# Patient Record
Sex: Male | Born: 1950 | Race: White | Hispanic: No | Marital: Married | Smoking: Former smoker
Health system: Southern US, Community
[De-identification: ages and names within clinical notes are randomized; demographics above are authoritative.]

## PROBLEM LIST (undated history)

## (undated) DIAGNOSIS — L509 Urticaria, unspecified: Secondary | ICD-10-CM

## (undated) DIAGNOSIS — I255 Ischemic cardiomyopathy: Secondary | ICD-10-CM

## (undated) DIAGNOSIS — K56609 Unspecified intestinal obstruction, unspecified as to partial versus complete obstruction: Secondary | ICD-10-CM

## (undated) DIAGNOSIS — I252 Old myocardial infarction: Secondary | ICD-10-CM

## (undated) DIAGNOSIS — L989 Disorder of the skin and subcutaneous tissue, unspecified: Secondary | ICD-10-CM

## (undated) DIAGNOSIS — N529 Male erectile dysfunction, unspecified: Secondary | ICD-10-CM

## (undated) DIAGNOSIS — G43009 Migraine without aura, not intractable, without status migrainosus: Secondary | ICD-10-CM

## (undated) DIAGNOSIS — R9439 Abnormal result of other cardiovascular function study: Secondary | ICD-10-CM

## (undated) DIAGNOSIS — G4733 Obstructive sleep apnea (adult) (pediatric): Secondary | ICD-10-CM

## (undated) DIAGNOSIS — K5790 Diverticulosis of intestine, part unspecified, without perforation or abscess without bleeding: Secondary | ICD-10-CM

## (undated) DIAGNOSIS — R51 Headache: Secondary | ICD-10-CM

## (undated) DIAGNOSIS — I2510 Atherosclerotic heart disease of native coronary artery without angina pectoris: Secondary | ICD-10-CM

## (undated) DIAGNOSIS — G629 Polyneuropathy, unspecified: Secondary | ICD-10-CM

## (undated) DIAGNOSIS — E785 Hyperlipidemia, unspecified: Secondary | ICD-10-CM

## (undated) DIAGNOSIS — I10 Essential (primary) hypertension: Secondary | ICD-10-CM

## (undated) DIAGNOSIS — Z87438 Personal history of other diseases of male genital organs: Secondary | ICD-10-CM

## (undated) DIAGNOSIS — R001 Bradycardia, unspecified: Secondary | ICD-10-CM

## (undated) DIAGNOSIS — I2699 Other pulmonary embolism without acute cor pulmonale: Secondary | ICD-10-CM

## (undated) DIAGNOSIS — N400 Enlarged prostate without lower urinary tract symptoms: Secondary | ICD-10-CM

## (undated) DIAGNOSIS — K2270 Barrett's esophagus without dysplasia: Secondary | ICD-10-CM

## (undated) DIAGNOSIS — M5481 Occipital neuralgia: Secondary | ICD-10-CM

## (undated) DIAGNOSIS — I5022 Chronic systolic (congestive) heart failure: Secondary | ICD-10-CM

## (undated) DIAGNOSIS — I501 Left ventricular failure: Secondary | ICD-10-CM

## (undated) DIAGNOSIS — R079 Chest pain, unspecified: Secondary | ICD-10-CM

## (undated) DIAGNOSIS — N200 Calculus of kidney: Secondary | ICD-10-CM

## (undated) DIAGNOSIS — Z7902 Long term (current) use of antithrombotics/antiplatelets: Secondary | ICD-10-CM

## (undated) DIAGNOSIS — L501 Idiopathic urticaria: Secondary | ICD-10-CM

## (undated) DIAGNOSIS — I249 Acute ischemic heart disease, unspecified: Secondary | ICD-10-CM

## (undated) DIAGNOSIS — G2581 Restless legs syndrome: Secondary | ICD-10-CM

## (undated) DIAGNOSIS — M7138 Other bursal cyst, other site: Secondary | ICD-10-CM

## (undated) DIAGNOSIS — E871 Hypo-osmolality and hyponatremia: Secondary | ICD-10-CM

## (undated) DIAGNOSIS — K259 Gastric ulcer, unspecified as acute or chronic, without hemorrhage or perforation: Secondary | ICD-10-CM

## (undated) DIAGNOSIS — K2970 Gastritis, unspecified, without bleeding: Secondary | ICD-10-CM

## (undated) DIAGNOSIS — I5020 Unspecified systolic (congestive) heart failure: Secondary | ICD-10-CM

## (undated) DIAGNOSIS — F32A Depression, unspecified: Secondary | ICD-10-CM

## (undated) DIAGNOSIS — R600 Localized edema: Secondary | ICD-10-CM

## (undated) DIAGNOSIS — R42 Dizziness and giddiness: Secondary | ICD-10-CM

## (undated) DIAGNOSIS — I951 Orthostatic hypotension: Secondary | ICD-10-CM

## (undated) DIAGNOSIS — F419 Anxiety disorder, unspecified: Secondary | ICD-10-CM

## (undated) DIAGNOSIS — E119 Type 2 diabetes mellitus without complications: Secondary | ICD-10-CM

## (undated) DIAGNOSIS — F329 Major depressive disorder, single episode, unspecified: Secondary | ICD-10-CM

## (undated) DIAGNOSIS — I509 Heart failure, unspecified: Secondary | ICD-10-CM

## (undated) DIAGNOSIS — R222 Localized swelling, mass and lump, trunk: Secondary | ICD-10-CM

## (undated) DIAGNOSIS — N486 Induration penis plastica: Secondary | ICD-10-CM

## (undated) DIAGNOSIS — I739 Peripheral vascular disease, unspecified: Secondary | ICD-10-CM

## (undated) DIAGNOSIS — K769 Liver disease, unspecified: Secondary | ICD-10-CM

## (undated) DIAGNOSIS — I5030 Unspecified diastolic (congestive) heart failure: Secondary | ICD-10-CM

## (undated) DIAGNOSIS — G5600 Carpal tunnel syndrome, unspecified upper limb: Secondary | ICD-10-CM

## (undated) DIAGNOSIS — L603 Nail dystrophy: Secondary | ICD-10-CM

## (undated) DIAGNOSIS — K649 Unspecified hemorrhoids: Secondary | ICD-10-CM

## (undated) DIAGNOSIS — N139 Obstructive and reflux uropathy, unspecified: Secondary | ICD-10-CM

## (undated) DIAGNOSIS — L301 Dyshidrosis [pompholyx]: Secondary | ICD-10-CM

## (undated) DIAGNOSIS — I209 Angina pectoris, unspecified: Secondary | ICD-10-CM

## (undated) DIAGNOSIS — R1310 Dysphagia, unspecified: Secondary | ICD-10-CM

## (undated) DIAGNOSIS — M1990 Unspecified osteoarthritis, unspecified site: Secondary | ICD-10-CM

## (undated) DIAGNOSIS — I700 Atherosclerosis of aorta: Secondary | ICD-10-CM

## (undated) DIAGNOSIS — Z87442 Personal history of urinary calculi: Secondary | ICD-10-CM

## (undated) DIAGNOSIS — G8929 Other chronic pain: Secondary | ICD-10-CM

## (undated) DIAGNOSIS — I639 Cerebral infarction, unspecified: Secondary | ICD-10-CM

## (undated) DIAGNOSIS — Z981 Arthrodesis status: Secondary | ICD-10-CM

## (undated) DIAGNOSIS — K5792 Diverticulitis of intestine, part unspecified, without perforation or abscess without bleeding: Secondary | ICD-10-CM

## (undated) DIAGNOSIS — R519 Headache, unspecified: Secondary | ICD-10-CM

## (undated) DIAGNOSIS — K219 Gastro-esophageal reflux disease without esophagitis: Secondary | ICD-10-CM

## (undated) DIAGNOSIS — M797 Fibromyalgia: Secondary | ICD-10-CM

## (undated) DIAGNOSIS — I219 Acute myocardial infarction, unspecified: Secondary | ICD-10-CM

## (undated) DIAGNOSIS — R55 Syncope and collapse: Secondary | ICD-10-CM

## (undated) DIAGNOSIS — J449 Chronic obstructive pulmonary disease, unspecified: Secondary | ICD-10-CM

## (undated) HISTORY — PX: COLONOSCOPY: SHX174

## (undated) HISTORY — DX: Localized swelling, mass and lump, trunk: R22.2

## (undated) HISTORY — DX: Dizziness and giddiness: R42

## (undated) HISTORY — DX: Other pulmonary embolism without acute cor pulmonale: I26.99

## (undated) HISTORY — DX: Calculus of kidney: N20.0

## (undated) HISTORY — PX: BACK SURGERY: SHX140

## (undated) HISTORY — DX: Chest pain, unspecified: R07.9

## (undated) HISTORY — DX: Atherosclerotic heart disease of native coronary artery without angina pectoris: I25.10

## (undated) HISTORY — PX: HERNIA REPAIR: SHX51

## (undated) HISTORY — PX: CORONARY ARTERY BYPASS GRAFT: SHX141

## (undated) HISTORY — DX: Male erectile dysfunction, unspecified: N52.9

## (undated) HISTORY — DX: Acute myocardial infarction, unspecified: I21.9

## (undated) HISTORY — DX: Barrett's esophagus without dysplasia: K22.70

## (undated) HISTORY — PX: CERVICAL FUSION: SHX112

## (undated) HISTORY — DX: Obstructive sleep apnea (adult) (pediatric): G47.33

## (undated) HISTORY — DX: Fibromyalgia: M79.7

## (undated) HISTORY — DX: Other chronic pain: G89.29

## (undated) HISTORY — DX: Orthostatic hypotension: I95.1

## (undated) HISTORY — PX: CATARACT EXTRACTION: SUR2

## (undated) HISTORY — DX: Ischemic cardiomyopathy: I25.5

## (undated) HISTORY — DX: Unspecified diastolic (congestive) heart failure: I50.30

## (undated) HISTORY — PX: CHOLECYSTECTOMY: SHX55

## (undated) HISTORY — DX: Chronic systolic (congestive) heart failure: I50.22

## (undated) HISTORY — DX: Disorder of the skin and subcutaneous tissue, unspecified: L98.9

## (undated) HISTORY — DX: Hyperlipidemia, unspecified: E78.5

## (undated) HISTORY — DX: Diverticulitis of intestine, part unspecified, without perforation or abscess without bleeding: K57.92

## (undated) HISTORY — PX: EYE SURGERY: SHX253

## (undated) HISTORY — DX: Cerebral infarction, unspecified: I63.9

## (undated) HISTORY — DX: Unspecified intestinal obstruction, unspecified as to partial versus complete obstruction: K56.609

## (undated) HISTORY — DX: Urticaria, unspecified: L50.9

## (undated) HISTORY — DX: Syncope and collapse: R55

## (undated) HISTORY — DX: Essential (primary) hypertension: I10

## (undated) HISTORY — DX: Major depressive disorder, single episode, unspecified: F32.9

## (undated) HISTORY — DX: Depression, unspecified: F32.A

---

## 1991-04-23 DIAGNOSIS — I219 Acute myocardial infarction, unspecified: Secondary | ICD-10-CM

## 1991-04-23 DIAGNOSIS — I2510 Atherosclerotic heart disease of native coronary artery without angina pectoris: Secondary | ICD-10-CM

## 1991-04-23 DIAGNOSIS — I639 Cerebral infarction, unspecified: Secondary | ICD-10-CM

## 1991-04-23 HISTORY — DX: Atherosclerotic heart disease of native coronary artery without angina pectoris: I25.10

## 1991-04-23 HISTORY — DX: Cerebral infarction, unspecified: I63.9

## 1991-04-23 HISTORY — DX: Acute myocardial infarction, unspecified: I21.9

## 1992-01-24 DIAGNOSIS — Z951 Presence of aortocoronary bypass graft: Secondary | ICD-10-CM

## 1992-01-24 HISTORY — DX: Presence of aortocoronary bypass graft: Z95.1

## 2004-04-22 HISTORY — PX: CORONARY ANGIOPLASTY: SHX604

## 2004-11-28 ENCOUNTER — Inpatient Hospital Stay: Payer: Self-pay | Admitting: Internal Medicine

## 2004-11-29 ENCOUNTER — Inpatient Hospital Stay (HOSPITAL_COMMUNITY): Admission: AD | Admit: 2004-11-29 | Discharge: 2004-12-01 | Payer: Self-pay | Admitting: Cardiology

## 2005-01-01 ENCOUNTER — Encounter: Payer: Self-pay | Admitting: Cardiovascular Disease

## 2005-01-11 ENCOUNTER — Ambulatory Visit: Payer: Self-pay | Admitting: Internal Medicine

## 2005-01-20 ENCOUNTER — Encounter: Payer: Self-pay | Admitting: Cardiovascular Disease

## 2005-02-20 ENCOUNTER — Encounter: Payer: Self-pay | Admitting: Cardiovascular Disease

## 2005-03-22 ENCOUNTER — Encounter: Payer: Self-pay | Admitting: Cardiovascular Disease

## 2005-03-28 ENCOUNTER — Ambulatory Visit: Payer: Self-pay | Admitting: Oncology

## 2005-04-04 ENCOUNTER — Ambulatory Visit: Payer: Self-pay | Admitting: Oncology

## 2005-04-10 ENCOUNTER — Ambulatory Visit (HOSPITAL_COMMUNITY): Admission: RE | Admit: 2005-04-10 | Discharge: 2005-04-10 | Payer: Self-pay | Admitting: Oncology

## 2005-05-07 ENCOUNTER — Ambulatory Visit: Payer: Self-pay | Admitting: Gastroenterology

## 2005-06-27 ENCOUNTER — Ambulatory Visit: Payer: Self-pay | Admitting: Unknown Physician Specialty

## 2005-07-11 ENCOUNTER — Ambulatory Visit: Payer: Self-pay | Admitting: Unknown Physician Specialty

## 2005-07-12 ENCOUNTER — Ambulatory Visit: Payer: Self-pay | Admitting: Oncology

## 2005-09-02 ENCOUNTER — Ambulatory Visit: Payer: Self-pay | Admitting: Pain Medicine

## 2005-09-17 ENCOUNTER — Ambulatory Visit: Payer: Self-pay | Admitting: Pain Medicine

## 2005-10-01 ENCOUNTER — Ambulatory Visit: Payer: Self-pay | Admitting: Physician Assistant

## 2005-10-10 ENCOUNTER — Inpatient Hospital Stay (HOSPITAL_COMMUNITY): Admission: AD | Admit: 2005-10-10 | Discharge: 2005-10-11 | Payer: Self-pay | Admitting: Cardiovascular Disease

## 2006-01-20 DIAGNOSIS — I2699 Other pulmonary embolism without acute cor pulmonale: Secondary | ICD-10-CM | POA: Insufficient documentation

## 2006-01-20 DIAGNOSIS — Q211 Atrial septal defect, unspecified: Secondary | ICD-10-CM

## 2006-01-20 DIAGNOSIS — I4891 Unspecified atrial fibrillation: Secondary | ICD-10-CM

## 2006-01-20 HISTORY — DX: Atrial septal defect, unspecified: Q21.10

## 2006-01-20 HISTORY — DX: Unspecified atrial fibrillation: I48.91

## 2006-01-20 HISTORY — DX: Other pulmonary embolism without acute cor pulmonale: I26.99

## 2006-02-18 ENCOUNTER — Other Ambulatory Visit: Payer: Self-pay

## 2006-02-18 ENCOUNTER — Inpatient Hospital Stay: Payer: Self-pay | Admitting: Gastroenterology

## 2006-03-19 ENCOUNTER — Encounter: Payer: Self-pay | Admitting: Cardiovascular Disease

## 2006-03-22 ENCOUNTER — Encounter: Payer: Self-pay | Admitting: Cardiovascular Disease

## 2006-04-04 ENCOUNTER — Ambulatory Visit: Payer: Self-pay | Admitting: Gastroenterology

## 2006-04-22 ENCOUNTER — Encounter: Payer: Self-pay | Admitting: Cardiovascular Disease

## 2006-05-24 ENCOUNTER — Other Ambulatory Visit: Payer: Self-pay

## 2006-05-24 ENCOUNTER — Inpatient Hospital Stay: Payer: Self-pay | Admitting: Cardiovascular Disease

## 2006-05-26 ENCOUNTER — Encounter: Payer: Self-pay | Admitting: Cardiovascular Disease

## 2006-06-21 ENCOUNTER — Encounter: Payer: Self-pay | Admitting: Cardiovascular Disease

## 2006-07-14 ENCOUNTER — Encounter: Payer: Self-pay | Admitting: Cardiovascular Disease

## 2006-07-22 ENCOUNTER — Encounter: Payer: Self-pay | Admitting: Cardiovascular Disease

## 2006-08-21 ENCOUNTER — Encounter: Payer: Self-pay | Admitting: Cardiovascular Disease

## 2006-09-21 ENCOUNTER — Encounter: Payer: Self-pay | Admitting: Cardiovascular Disease

## 2006-10-21 ENCOUNTER — Encounter: Payer: Self-pay | Admitting: Cardiovascular Disease

## 2006-11-21 ENCOUNTER — Encounter: Payer: Self-pay | Admitting: Cardiovascular Disease

## 2006-11-21 HISTORY — PX: CARDIAC CATHETERIZATION: SHX172

## 2006-12-08 ENCOUNTER — Ambulatory Visit: Payer: Self-pay | Admitting: Cardiovascular Disease

## 2007-05-20 ENCOUNTER — Ambulatory Visit: Payer: Self-pay | Admitting: Rheumatology

## 2007-07-20 ENCOUNTER — Ambulatory Visit: Payer: Self-pay | Admitting: Urology

## 2007-11-09 ENCOUNTER — Ambulatory Visit: Payer: Self-pay | Admitting: Gastroenterology

## 2007-12-16 ENCOUNTER — Ambulatory Visit: Payer: Self-pay | Admitting: Unknown Physician Specialty

## 2007-12-30 ENCOUNTER — Ambulatory Visit: Payer: Self-pay | Admitting: Unknown Physician Specialty

## 2007-12-30 ENCOUNTER — Other Ambulatory Visit: Payer: Self-pay

## 2007-12-31 ENCOUNTER — Emergency Department: Payer: Self-pay | Admitting: Emergency Medicine

## 2007-12-31 ENCOUNTER — Ambulatory Visit: Payer: Self-pay | Admitting: Unknown Physician Specialty

## 2008-02-29 ENCOUNTER — Ambulatory Visit: Payer: Self-pay | Admitting: Internal Medicine

## 2008-04-22 HISTORY — PX: SHOULDER SURGERY: SHX246

## 2008-06-01 ENCOUNTER — Ambulatory Visit: Payer: Self-pay | Admitting: Internal Medicine

## 2008-11-01 ENCOUNTER — Ambulatory Visit: Payer: Self-pay | Admitting: Internal Medicine

## 2008-11-23 ENCOUNTER — Ambulatory Visit: Payer: Self-pay | Admitting: Gastroenterology

## 2009-01-20 HISTORY — PX: OTHER SURGICAL HISTORY: SHX169

## 2009-01-20 HISTORY — PX: COLON SURGERY: SHX602

## 2009-01-21 ENCOUNTER — Inpatient Hospital Stay: Payer: Self-pay | Admitting: Internal Medicine

## 2009-02-20 DIAGNOSIS — K56609 Unspecified intestinal obstruction, unspecified as to partial versus complete obstruction: Secondary | ICD-10-CM

## 2009-02-20 HISTORY — DX: Unspecified intestinal obstruction, unspecified as to partial versus complete obstruction: K56.609

## 2009-06-17 ENCOUNTER — Ambulatory Visit: Payer: Self-pay | Admitting: Neurosurgery

## 2009-06-20 HISTORY — PX: NECK SURGERY: SHX720

## 2009-07-07 ENCOUNTER — Inpatient Hospital Stay (HOSPITAL_COMMUNITY): Admission: RE | Admit: 2009-07-07 | Discharge: 2009-07-08 | Payer: Self-pay | Admitting: Neurosurgery

## 2009-07-23 ENCOUNTER — Emergency Department: Payer: Self-pay | Admitting: Internal Medicine

## 2009-07-24 ENCOUNTER — Emergency Department: Payer: Self-pay | Admitting: Emergency Medicine

## 2009-11-30 ENCOUNTER — Ambulatory Visit: Payer: Self-pay | Admitting: Gastroenterology

## 2009-12-04 ENCOUNTER — Ambulatory Visit: Payer: Self-pay | Admitting: Gastroenterology

## 2009-12-11 ENCOUNTER — Ambulatory Visit: Payer: Self-pay | Admitting: Neurosurgery

## 2009-12-19 ENCOUNTER — Ambulatory Visit: Payer: Self-pay | Admitting: Gastroenterology

## 2009-12-22 LAB — PATHOLOGY REPORT

## 2010-01-15 ENCOUNTER — Ambulatory Visit: Payer: Self-pay | Admitting: Cardiovascular Disease

## 2010-01-22 ENCOUNTER — Telehealth: Payer: Self-pay | Admitting: Cardiovascular Disease

## 2010-01-30 ENCOUNTER — Ambulatory Visit: Payer: Self-pay | Admitting: Surgery

## 2010-01-31 LAB — PATHOLOGY REPORT

## 2010-02-27 ENCOUNTER — Ambulatory Visit (HOSPITAL_COMMUNITY)
Admission: RE | Admit: 2010-02-27 | Discharge: 2010-02-28 | Payer: Self-pay | Source: Home / Self Care | Admitting: Neurosurgery

## 2010-04-20 ENCOUNTER — Ambulatory Visit: Payer: Self-pay | Admitting: Neurosurgery

## 2010-04-20 ENCOUNTER — Emergency Department: Payer: Self-pay | Admitting: Emergency Medicine

## 2010-05-15 ENCOUNTER — Ambulatory Visit: Payer: Self-pay | Admitting: Gastroenterology

## 2010-05-22 NOTE — Progress Notes (Signed)
  Phone Note Outgoing Call   Call placed by: Dessie Coma  LPN,  January 22, 2010 12:08 PM Call placed to: Patient Summary of Call: [facility] for patient to return call.  Follow-up for Phone Call        Patient called:  Informed patient per Dr. Kirke Corin, liver test is normal.  Cholesterol is very good but Triglycerides is high.  Per Dr. Kirke Corin, to start on fish oil 1000mg  2 caps/day.  Per patient's wife, patient has tried fish oil before and he begins to itch.  Will notify Dr. Kirke Corin. Follow-up by: Dessie Coma  LPN,  January 22, 2010 2:48 PM  Additional Follow-up for Phone Call Additional follow up Details #1::        Patient called:LMVM for patient to call this nurse back. Additional Follow-up by: Dessie Coma  LPN,  January 24, 2010 8:57 AM    Additional Follow-up for Phone Call Additional follow up Details #2::    T.C. from wife-notified her per Dr. Kirke Corin, OK to not take fish oil.  Triglycerides are not that high.  He should monitor his diet and increase his exercise. Follow-up by: Dessie Coma  LPN,  January 24, 2010 2:30 PM

## 2010-06-21 HISTORY — PX: CARDIAC CATHETERIZATION: SHX172

## 2010-07-03 LAB — CBC
Hemoglobin: 15.1 g/dL (ref 13.0–17.0)
MCH: 31.7 pg (ref 26.0–34.0)
MCV: 93.1 fL (ref 78.0–100.0)
RBC: 4.77 MIL/uL (ref 4.22–5.81)
WBC: 6.3 10*3/uL (ref 4.0–10.5)

## 2010-07-03 LAB — BASIC METABOLIC PANEL
CO2: 29 mEq/L (ref 19–32)
Chloride: 105 mEq/L (ref 96–112)
GFR calc Af Amer: >60 mL/min (ref >60)
Sodium: 141 mEq/L (ref 135–145)

## 2010-07-03 LAB — SURGICAL PCR SCREEN: Staphylococcus aureus: NEGATIVE

## 2010-07-12 ENCOUNTER — Encounter: Payer: Self-pay | Admitting: Cardiovascular Disease

## 2010-07-12 ENCOUNTER — Ambulatory Visit (INDEPENDENT_AMBULATORY_CARE_PROVIDER_SITE_OTHER): Payer: Medicare Other | Admitting: Cardiovascular Disease

## 2010-07-12 DIAGNOSIS — N529 Male erectile dysfunction, unspecified: Secondary | ICD-10-CM

## 2010-07-12 DIAGNOSIS — E785 Hyperlipidemia, unspecified: Secondary | ICD-10-CM

## 2010-07-12 DIAGNOSIS — I10 Essential (primary) hypertension: Secondary | ICD-10-CM

## 2010-07-12 DIAGNOSIS — R001 Bradycardia, unspecified: Secondary | ICD-10-CM

## 2010-07-12 DIAGNOSIS — I498 Other specified cardiac arrhythmias: Secondary | ICD-10-CM

## 2010-07-12 DIAGNOSIS — R079 Chest pain, unspecified: Secondary | ICD-10-CM

## 2010-07-12 DIAGNOSIS — I2510 Atherosclerotic heart disease of native coronary artery without angina pectoris: Secondary | ICD-10-CM

## 2010-07-12 NOTE — Assessment & Plan Note (Signed)
Willie Clark had a significant progression of his anginal symptoms over the last few weeks and currently he is in NYHA class 3. Due to this and previous extensive cardiac history, I recommend cardiac catheterization and possible PCI. Risks, benefits and alternatives were discussed. Will continue current medical therapy but will decrease Metoprolol to 25 mg daily due to bradycardia.

## 2010-07-12 NOTE — Assessment & Plan Note (Signed)
Continue treatment with Crestor. 

## 2010-07-12 NOTE — Progress Notes (Signed)
HPI  Willie Clark is here for an urgent follow up visit. He has ne been feeling well over last few weeks. He has been having chest tightness and dyspnea with minimal activities even when doing minor yard work. He is extremely fatigued. He has been using sl NTG frequently. No palpitations or syncope. Most recent cardiac cath was in 2008.    Allergies  Allergen Reactions  . Fish Oil      No current outpatient prescriptions on file prior to visit.     Past Medical History  Diagnosis Date  . Chronic chest pain   . Hypertension   . Hyperlipidemia   . Stroke   . Sleep apnea   . Depression   . Bowel obstruction 02/2009    small bowel  . Pulmonary embolism 01/2006    Post-op/treated  . Lightheadedness   . SOB (shortness of breath)   . Syncope and collapse   . Coronary artery disease 1993    MI   . Fibromyalgia      Past Surgical History  Procedure Date  . Bowel obstruction 01/2009  . Shoulder surgery 2010  . Neck surgery 06/2009  . Coronary artery bypass graft 1993/01/2006    redo at [facility]. LIMA to LAD, SVG to OM2 and SVG to RPDA  . Cardiac catheterization 11/2006    patent grafts. Significant OM3 disease and occluded diagonals.     Family History  Problem Relation Age of Onset  . Heart disease Father 53     History   Social History  . Marital Status: Married    Spouse Name: N/A    Number of Children: N/A  . Years of Education: N/A   Occupational History  . Not on file.   Social History Main Topics  . Smoking status: Former Smoker    Types: Cigarettes  . Smokeless tobacco: Not on file  . Alcohol Use: No  . Drug Use: No  . Sexually Active:    Other Topics Concern  . Not on file   Social History Narrative  . No narrative on file     ROS Constitutional: Negative for fever, chills, activity change and appetite change . Positive for diaphoresis and fatigue.  HENT: Negative for hearing loss, nosebleeds, congestion, sore throat, facial swelling, drooling,  trouble swallowing, neck pain, voice change, sinus pressure and tinnitus.  Eyes: Negative for photophobia, pain, discharge and visual disturbance.  Respiratory: Negative for apnea, cough,  and wheezing.  Cardiovascular: Negative for palpitations and leg swelling. Positive for chest tightness and shortness of breath. Gastrointestinal: Negative for nausea, vomiting, abdominal pain, diarrhea, constipation, blood in stool and abdominal distention.  Genitourinary: Negative for dysuria, urgency, frequency, hematuria and decreased urine volume.  Musculoskeletal: Negative for   joint swelling and gait problem. Positive for myalgias, back pain and arthralgias. Skin: Negative for color change, pallor, rash and wound.  Neurological: Negative for dizziness, tremors, seizures, syncope, speech difficulty, weakness, light-headedness, numbness and headaches.  Psychiatric/Behavioral: Negative for suicidal ideas, hallucinations, behavioral problems and agitation. The patient is not nervous/anxious.     PHYSICAL EXAM   BP 124/73  Pulse 57  Wt 196 lb 6.4 oz (89.086 kg)  SpO2 97%  Constitutional: He is oriented to person, place, and time. He appears well-developed and well-nourished. No distress.  HENT:  Head: Normocephalic and atraumatic.  Eyes: Pupils are equal, round, and reactive to light. Right eye exhibits no discharge. Left eye exhibits no discharge.  Neck: Normal range of motion. Neck supple. No  JVD present. No thyromegaly present.  Cardiovascular: Normal rate, regular rhythm, normal heart sounds and intact distal pulses. Exam reveals no gallop and no friction rub.  No murmur heard.  Pulmonary/Chest: Effort normal and breath sounds normal. No stridor. No respiratory distress. He has no wheezes. He has no rales. He exhibits no tenderness.  Abdominal: Soft. Bowel sounds are normal. He exhibits no distension. There is no tenderness. There is no rebound and no guarding.  Musculoskeletal: Normal range of  motion. He exhibits no edema and no tenderness.  Neurological: He is alert and oriented to person, place, and time. Coordination normal.  Skin: Skin is warm and dry. No rash noted. He is not diaphoretic. No erythema. No pallor.  Psychiatric: He has a normal mood and affect. His behavior is normal. Judgment and thought content normal.      EKG: Sinus bradycardia. NSSTTW changes.    ASSESSMENT AND PLAN

## 2010-07-12 NOTE — Assessment & Plan Note (Signed)
This could be contributing to his fatigue. Will decrease Metoprolol ER to 25 mg daily.

## 2010-07-12 NOTE — Assessment & Plan Note (Signed)
BP is well controlled 

## 2010-07-12 NOTE — Assessment & Plan Note (Signed)
Will consider treatment with a PDE inhibitor if his cardiac status is optimized and can get him off NTG.

## 2010-07-15 LAB — BASIC METABOLIC PANEL
CO2: 29 mEq/L (ref 19–32)
Calcium: 9.6 mg/dL (ref 8.4–10.5)
GFR calc Af Amer: >60 mL/min (ref >60)
GFR calc non Af Amer: >60 mL/min (ref >60)
Glucose, Bld: 103 mg/dL — ABNORMAL HIGH (ref 70–99)
Potassium: 4.5 mEq/L (ref 3.5–5.1)
Sodium: 140 mEq/L (ref 135–145)

## 2010-07-15 LAB — CBC
HCT: 42.9 % (ref 39.0–52.0)
Hemoglobin: 14.7 g/dL (ref 13.0–17.0)
RBC: 4.47 MIL/uL (ref 4.22–5.81)
RDW: 13.5 % (ref 11.5–15.5)

## 2010-07-15 LAB — SURGICAL PCR SCREEN: MRSA, PCR: NEGATIVE

## 2010-07-16 ENCOUNTER — Ambulatory Visit: Payer: Self-pay | Admitting: Cardiovascular Disease

## 2010-07-18 ENCOUNTER — Ambulatory Visit (HOSPITAL_COMMUNITY)
Admission: RE | Admit: 2010-07-18 | Discharge: 2010-07-18 | Disposition: A | Discharge status: Home / Self Care | Payer: Medicare Other | Source: Ambulatory Visit | Attending: Cardiovascular Disease | Admitting: Cardiovascular Disease

## 2010-07-18 DIAGNOSIS — R0989 Other specified symptoms and signs involving the circulatory and respiratory systems: Secondary | ICD-10-CM | POA: Insufficient documentation

## 2010-07-18 DIAGNOSIS — I2510 Atherosclerotic heart disease of native coronary artery without angina pectoris: Secondary | ICD-10-CM

## 2010-07-18 DIAGNOSIS — R0609 Other forms of dyspnea: Secondary | ICD-10-CM | POA: Insufficient documentation

## 2010-07-18 DIAGNOSIS — R079 Chest pain, unspecified: Secondary | ICD-10-CM | POA: Insufficient documentation

## 2010-07-18 DIAGNOSIS — Z9861 Coronary angioplasty status: Secondary | ICD-10-CM | POA: Insufficient documentation

## 2010-07-18 LAB — BASIC METABOLIC PANEL
BUN: 9 mg/dL (ref 6–23)
Chloride: 101 mEq/L (ref 96–112)
Glucose, Bld: 105 mg/dL — ABNORMAL HIGH (ref 70–99)
Potassium: 3.7 mEq/L (ref 3.5–5.1)

## 2010-07-18 LAB — CBC
HCT: 41.9 % (ref 39.0–52.0)
Hemoglobin: 13.9 g/dL (ref 13.0–17.0)
MCHC: 33.2 g/dL (ref 30.0–36.0)
RBC: 4.63 MIL/uL (ref 4.22–5.81)

## 2010-07-18 LAB — PROTIME-INR
INR: 1.02 (ref 0.00–1.49)
Prothrombin Time: 13.6 seconds (ref 11.6–15.2)

## 2010-07-18 LAB — GLUCOSE, CAPILLARY: Glucose-Capillary: 108 mg/dL — ABNORMAL HIGH (ref 70–99)

## 2010-07-23 ENCOUNTER — Encounter: Payer: Self-pay | Admitting: Cardiovascular Disease

## 2010-07-23 ENCOUNTER — Ambulatory Visit (INDEPENDENT_AMBULATORY_CARE_PROVIDER_SITE_OTHER): Payer: Medicare Other | Admitting: Cardiovascular Disease

## 2010-07-23 DIAGNOSIS — I10 Essential (primary) hypertension: Secondary | ICD-10-CM

## 2010-07-23 DIAGNOSIS — N529 Male erectile dysfunction, unspecified: Secondary | ICD-10-CM

## 2010-07-23 DIAGNOSIS — E785 Hyperlipidemia, unspecified: Secondary | ICD-10-CM

## 2010-07-23 DIAGNOSIS — I2510 Atherosclerotic heart disease of native coronary artery without angina pectoris: Secondary | ICD-10-CM

## 2010-07-23 NOTE — Assessment & Plan Note (Signed)
Continue treatment with Crestor. 

## 2010-07-23 NOTE — Assessment & Plan Note (Signed)
BP is well controlled. I decreased Metoprolol to 25 mg daily during last visit due to bradycardia.

## 2010-07-23 NOTE — Progress Notes (Signed)
HPI  Willie Clark is here for a follow up visit after a recent cardiac cath. It showed patent grafts with normal LVSF. There was significant in stent restenosis in proximal LCX. However, graft to OM2 was patent and gave retrograde flow to OM3. Thus, I elected not to perform PCI which would probably have a marginal benefit. He is doing reasonably well. Mild chest discomfort and dyspnea. He is still concerned about erectile dysfunction.   Allergies  Allergen Reactions  . Fish Oil      Current Outpatient Prescriptions on File Prior to Visit  Medication Sig Dispense Refill  . acetaminophen (TYLENOL) 500 MG tablet Take 500 mg by mouth every 6 (six) hours as needed.        [name] amLODipine (NORVASC) 2.5 MG tablet Take 2.5 mg by mouth daily.        [name] aspirin 325 MG tablet Take 325 mg by mouth daily.        . enalapril (VASOTEC) 5 MG tablet Take 5 mg by mouth daily.        . fluticasone (VERAMYST) 27.5 MCG/SPRAY nasal spray 2 sprays by Nasal route daily.        . furosemide (LASIX) 40 MG tablet Take 40 mg by mouth daily as needed.        . gabapentin (NEURONTIN) 600 MG tablet Take 800 mg by mouth 3 (three) times daily.       [name] HYDROcodone-acetaminophen (NORCO) 10-325 MG per tablet Take 1 tablet by mouth every 6 (six) hours as needed.        . isosorbide dinitrate (ISORDIL) 30 MG tablet Take 30 mg by mouth 2 (two) times daily.        . Loratadine (ALLERGY RELIEF REDI-TABS PO) Take 1 tablet by mouth daily.        . metoprolol (TOPROL-XL) 50 MG 24 hr tablet Take 25 mg by mouth daily.       . nitroGLYCERIN (NITROSTAT) 0.4 MG SL tablet Place 0.4 mg under the tongue every 5 (five) minutes as needed.        . pantoprazole (PROTONIX) 40 MG tablet Take 40 mg by mouth daily.        . rosuvastatin (CRESTOR) 20 MG tablet Take 20 mg by mouth daily.        [name] DISCONTD: DULoxetine (CYMBALTA) 30 MG capsule Take 30 mg by mouth 2 (two) times daily.           Past Medical History  Diagnosis Date  . Chronic chest pain     . Stroke   . Sleep apnea   . Depression   . Bowel obstruction 02/2009    small bowel  . Pulmonary embolism 01/2006    Post-op/treated  . Lightheadedness   . SOB (shortness of breath)   . Syncope and collapse   . Coronary artery disease 1993    MI   . Fibromyalgia   . Hypertension   . Hyperlipidemia      Past Surgical History  Procedure Date  . Bowel obstruction 01/2009  . Shoulder surgery 2010  . Neck surgery 06/2009  . Coronary artery bypass graft 1993/01/2006    redo at [facility]. LIMA to LAD, SVG to OM2 and SVG to RPDA  . Cardiac catheterization 11/2006    patent grafts. Significant OM3 disease and occluded diagonals.  . Cardiac catheterization 06/2010    patent grafts. significant ISR in proximal LCX but OM2 is bypassed and gives retrograde flow to OM3.  Family History  Problem Relation Age of Onset  . Heart disease Father 20     History   Social History  . Marital Status: Married    Spouse Name: N/A    Number of Children: N/A  . Years of Education: N/A   Occupational History  . Not on file.   Social History Main Topics  . Smoking status: Former Smoker    Types: Cigarettes  . Smokeless tobacco: Not on file  . Alcohol Use: No  . Drug Use: No  . Sexually Active:    Other Topics Concern  . Not on file   Social History Narrative  . No narrative on file     ROS Negative other than out is mentioned in HPI.   PHYSICAL EXAM   BP 126/76  Pulse 51  Wt 198 lb 6.4 oz (89.994 kg)  SpO2 95%  Constitutional: He is oriented to person, place, and time. He appears well-developed and well-nourished. No distress.  HENT:  Head: Normocephalic and atraumatic.  Eyes: Pupils are equal, round, and reactive to light. Right eye exhibits no discharge. Left eye exhibits no discharge.  Neck: Normal range of motion. Neck supple. No JVD present. No thyromegaly present.  Cardiovascular: Normal rate, regular rhythm, normal heart sounds and intact distal pulses. Exam  reveals no gallop and no friction rub.  No murmur heard.  Pulmonary/Chest: Effort normal and breath sounds normal. No stridor. No respiratory distress. He has no wheezes. He has no rales. He exhibits no tenderness.  Abdominal: Soft. Bowel sounds are normal. He exhibits no distension. There is no tenderness. There is no rebound and no guarding.  Musculoskeletal: Normal range of motion. He exhibits no edema and no tenderness.  Neurological: He is alert and oriented to person, place, and time. Coordination normal.  Skin: Skin is warm and dry. No rash noted. He is not diaphoretic. No erythema. No pallor.  Psychiatric: He has a normal mood and affect. His behavior is normal. Judgment and thought content normal.        ASSESSMENT AND PLAN

## 2010-07-23 NOTE — Assessment & Plan Note (Signed)
Recent cardiac cath showed patent grafts and no need for revascularization. I will go ahead and stop Isordil. Continue other medical therapy. I advised him to start an exercise program. He is going to enroll in [place].

## 2010-07-23 NOTE — Assessment & Plan Note (Signed)
I think this is contributing significantly to his depression and overall functional status. Will treat with Cialis 10 mg prn (samples given to asses response). He will be not on Isordil any more. I explained to him not to take NTG with this.

## 2010-07-25 ENCOUNTER — Encounter: Payer: Self-pay | Admitting: Cardiovascular Disease

## 2010-07-25 NOTE — Procedures (Signed)
NAME:  ILARIO, DHALIWAL                 ACCOUNT NO.:  [id number]  MEDICAL RECORD NO.:  [id number]           PATIENT TYPE:  O  LOCATION:  [facility]                         FACILITY:  [facility]  PHYSICIAN:  Lorine Bears, MD     DATE OF BIRTH:  Sep 23, 1950  DATE OF PROCEDURE:  07/18/2010 DATE OF DISCHARGE:  07/18/2010                           CARDIAC CATHETERIZATION   PROCEDURES PERFORMED: 1. Left heart catheterization 2. Coronary angiography. 3. LIMA angiography. 4. SVG angiography. 5. Left ventricular angiography.  ACCESS:  Right femoral artery.  INDICATIONS AND CLINICAL HISTORY:  [name] is a 60 year old gentleman who is well known to me.  He has extensive history of coronary artery disease status post coronary artery bypass graft surgery many years ago. He had multiple angioplasties since then, mostly to the left circumflex. He ultimately underwent redo coronary artery bypass surgery in August 2008 at [facility].  He presented for an outpatient evaluation with progressive symptoms of chest tightness and dyspnea which indicated worsening of his stable angina over the last 2 weeks.  He was not able to do his activities of daily living without significant discomfort. This was a significant change from his previous symptoms.  He is known to have chronic chest pain with element of fibromyalgia but also he does have chronic stable angina, which seems to have worsened recently.  Due to that and interfering with his activities of daily living, I recommended proceeding with cardiac catheterization and possible coronary intervention.  Risks, benefits, and alternatives were discussed with the patient.  STUDY DETAILS:  A standard informed consent was obtained.  The right femoral area was prepped in a sterile fashion.  It was anesthetized with 1% lidocaine.  A 5-French sheath was placed in the right femoral artery after an anterior puncture.  Coronary angiography was performed with JL- 3.5, JR-4, and  LIMA catheter.  Left ventricular angiography was performed with a pigtail catheter.  Please note that the JL-4 catheter could not engage his left main coronary artery.  The left ventricular angiography was performed in the RAO 30 position.  The patient tolerated the procedure well with no immediate complications.  STUDY FINDINGS:  Hemodynamic findings:  Left ventricular pressure is 102/7 with a left ventricular end-diastolic pressure of 11 mmHg.  Aortic pressure was 106/61 with a mean pressure of 80 mmHg.  Left ventricular angiography:  This showed normal LV systolic function with an estimated ejection fraction of 55%.  Coronary angiography: Left main coronary artery:  The vessel was normal in size.  There is 30- 40% ostial stenosis with no other significant disease in the rest of the left main coronary artery. Left circumflex artery:  The vessel was normal in size and nondominant. It was moderately calcified.  There is 20% ostial stenosis.  A stent is noted in the proximal extending into the mid area jailing OM-2 which is bypassed.  There is also a stent in the proximal OM-2.  There is an 80% in-stent restenosis in the proximal left circumflex before the takeoff of OM-2.  OM-3 has a 40% proximal stenosis.  The stent in OM-2 is patent.  Competitive  flow is noted in that distribution filling back to the OM-3 distribution. Left anterior descending artery:  The vessel was normal in size and mildly calcified.  There is 80-90% diffuse disease proximally extending to the mid segment with competitive flow distally from the patent LIMA. The diagonals appeared to be overall small.  There is a third diagonals that fills by collaterals from distal LAD. Right coronary artery:  The vessel was diffusely diseased proximally with 80% followed by total occlusion in the mid segment. LIMA to LAD:  The vessel was patent with no significant disease.  It is moderately tortuous.  The distal LAD gives  collaterals to a diagonal branch.  The distal LAD has no significant obstructive disease. SVG to OM-2:  The vessel was patent with no significant disease.  The flow goes backward through the proximal stent to provide supplied to OM- 3 distribution.  In the graft itself, there is a valve in the distal segment but there is no significant disease in that area. SVG to RCA:  The vessel was patent.  Overall, it has significant ectasia with a 30% proximal stenosis and 20% distal stenosis.  It has sluggish flow in it.  It supplies the PDA and posterolateral branches without significant disease in these branches.  CONCLUSION: 1. Severe native three-vessel coronary artery disease with patent     grafts.  Patent LIMA to LAD, SVG to OM, and SVG to right PDA. 2. Normal LV systolic function and normal left ventricular end-     diastolic pressure. 3. Significant in-stent restenosis in the proximal left circumflex,     mainly supplying the OM-3 distribution.  RECOMMENDATIONS: 1. I recommend continuing aggressive medical therapy. 2. The OM-3 distribution is getting retrograde blood flow from OM-2     which is bypassed by a graft.  Thus, opening the in-stent     restenosis in the proximal circumflex will likely have only limited     benefit.  It will carry significant risk, given the previous     multiple interventions in that area with calcifications.  During     last procedure, another stent could not be delivered to that area.     Thus, the benefit would be marginal and the risk would be high.  No     intervention is recommended in this area.     Lorine Bears, MD     MA/MEDQ  D:  07/18/2010  T:  07/19/2010  Job:  [id number]  Electronically Signed by Lorine Bears MD on 07/25/2010 03:35:08 PM

## 2010-07-26 ENCOUNTER — Ambulatory Visit: Payer: Self-pay | Admitting: Cardiovascular Disease

## 2010-07-31 ENCOUNTER — Ambulatory Visit: Payer: Self-pay | Admitting: Cardiovascular Disease

## 2010-09-04 NOTE — Assessment & Plan Note (Signed)
[facility]                        [facility] OFFICE NOTE   NAME:Bacigalupo, [name]                          MRN:          [id number]  DATE:01/15/2010                            DOB:          01-17-1951    Mr. Peters is a 60 year old gentleman who is transferring his care from  my old practice in [place].  He has the following problems list:  1. Coronary artery disease status post myocardial infarction in 1993.      At that time, he underwent coronary artery bypass graft surgery.      He had redo coronary artery bypass graft in October 2007.  Most      recent cardiac catheterization in August 2008 showed severe native      3-vessel coronary artery disease with patent left internal mammary      artery graft to left anterior descending coronary artery, patent      saphenous vein graft to obtuse marginal 2, and patent saphenous      vein graft to right coronary artery.  He did have significant      disease in obtuse marginal 3, which was not grafted but overall      served a small territory.  There was also occluded diagonals with      collaterals from the left anterior descending coronary artery and      circumflex.  He has been treated medically since then.  2. Chronic chest pain and history of fibromyalgia.  3. Hypertension.  4. Postoperative pulmonary embolism in October 2007, treated with      anticoagulation.  5. Hyperlipidemia.  6. Stroke.  7. Sleep apnea.  8. Small bowel obstruction status post surgery in November 2010.  9. Depression.   Mr. Calderwood is here today for his 6 months followup visit.  Overall, there  has been no significant change in his symptoms since his last visit.  He  continues to have chronic generalized aching due to his fibromyalgia  including chest pain which is not new.  He has chronic exertional  dyspnea.  There has been no change in the severity of his symptoms  though.  There is no palpitations, dizziness, syncope,  or presyncope.  He has bad days and good days.  On his bad days, he cannot do much of  his activities.  He is not actually working anymore.  He has been taking  his medications as prescribed.  He used to be on Ranexa in the past, but  it was stopped because it made no difference in his symptoms.  He is  going to need to have surgery done that include back surgery,  cholecystectomy, and cataract surgery.   MEDICATIONS:  1. Gabapentin 600 mg 3 times daily.  2. Sucralfate 10 mg 4 times daily.  3. Cymbalta 30 mg twice daily.  4. Isosorbide 30 mg twice daily.  5. Enalapril 5 mg once daily.  6. Amlodipine 2.5 mg once daily.  7. Metoprolol succinate 50 mg once daily.  8. Protonix 40 mg once daily.  9. Crestor 20 mg at bedtime.  10.Aspirin 325 mg  once daily.  11.Fluticasone 145 mg.  12.Furosemide 40 mg once daily only as needed.   ALLERGIES:  No known drug allergies.   SOCIAL HISTORY:  He quit smoking many years ago.  There is no alcohol or  drug use.  He exercises occasionally in the treadmill.  He is married.  He is currently on disability.   FAMILY HISTORY:  Remarkable for coronary artery disease.  His father  died at the age of 101 from myocardial infarction.   PHYSICAL EXAMINATION:  GENERAL:  The patient is pleasant, in no acute  distress.  He seems to be slightly depressed.  VITAL SIGNS:  Weight is 195 pounds, blood pressure is 133/82, pulse is  65, oxygen saturation is 96% on room air.  HEENT:  Normocephalic, atraumatic.  NECK:  No JVD or carotid bruits.  RESPIRATORY:  Normal respiratory effort with no use of accessory  muscles.  Auscultation reveals normal breath sounds.  CARDIOVASCULAR:  Normal PMI.  Normal S1 and S2 with no gallops or  murmurs.  ABDOMEN:  Benign, nontender, nondistended.  There is a scar from his  previous surgery.  EXTREMITIES:  No clubbing, cyanosis, or edema.  SKIN:  Warm and dry with no rash.  PSYCHIATRIC:  He is alert and oriented x3.  He seems to  be slightly  depressed.  MUSCULOSKELETAL:  There is normal muscle strength in the upper and lower  extremities.   An electrocardiogram was performed which showed normal sinus rhythm with  nonspecific ST-T wave changes.   IMPRESSION:  1. Coronary artery disease status post coronary artery bypass graft      surgery twice.  His symptoms have not changed since his last visit.      He has chronic chest pain which some of it is likely related to      fibromyalgia.  His most recent cardiac catheterization was in      August 2008, which showed patent grafts.  Most recent stress test      was in May 2010, which showed mild anterolateral and distal      anterior wall ischemia.  Part of that was felt to be due to      occluded diagonals with collaterals.  Most recent echocardiogram      was in May 2010, which showed normal LV systolic function, mild      left ventricular hypertrophy, mild diastolic dysfunction, mild      mitral and tricuspid regurgitation.  He is currently on good      medical therapy and we will continue with his current medications.      If he needs to have any surgery done in the near future, he can      have that with no further cardiac testing needed at this time given      that he had a stress test done last year.  He would be considered      at low-to-moderate risk from a cardiac standpoint.  All his cardiac      medications should be continued before the surgery.  Aspirin can be      stopped for 1 week if needed but if possible, an 81 mg once daily      should be continued if there is no significant bleeding risk.  It      can be stopped though in high-risk bleeding surgeries given that he      had no recent cardiac stenting.  2. Hyperlipidemia:  His most recent lipid  profile showed an LDL of 73.      He is due for a repeat fasting lipid profile and liver profile      which will be ordered.  We will continue with Crestor 20      mg daily.  3. Hypertension:  Blood  pressure is well controlled.  We will continue      with current medications.  He will follow up in 6 months from now      or earlier if needed.     Lorine Bears, MD  Electronically Signed    MA/MedQ  DD: 01/15/2010  DT: 01/16/2010  Job #: [id number]

## 2010-09-07 NOTE — Discharge Summary (Signed)
NAME:  Willie Clark, Willie Clark                 ACCOUNT NO.:  [id number]   MEDICAL RECORD NO.:  [id number]          PATIENT TYPE:  INP   LOCATION:  6523                         FACILITY:  [facility]   PHYSICIAN:  Ricki Rodriguez, M.D.  DATE OF BIRTH:  04-18-1951   DATE OF ADMISSION:  11/29/2004  DATE OF DISCHARGE:  12/01/2004                                 DISCHARGE SUMMARY   PRINCIPAL DIAGNOSES:  1.  Two vessel coronary artery atherosclerosis.  2.  Intermittent coronary syndrome.  3.  Coronary atherosclerosis of multiple levels vein bypass graft.  4.  Old myocardial infarction.  5.  Hypertension.  6.  Hypercholesterolemia.  7.  Depressive disorder.   PRINCIPAL PROCEDURE:  Percutaneous transluminal coronary angioplasty with  stent placement of obtuse marginal branch two by Dr. Rinaldo Cloud.   DISCHARGE MEDICATIONS:  1.  Aspirin 325 mg one daily.  2.  Plavix 75 mg one daily.  3.  Atenolol 25 mg one daily.  4.  Lisinopril 10 mg one daily.  5.  Lipitor 40 mg one daily.  6.  Zoloft 100 mg daily.  7.  Nitrostat 0.4 mg sublingual as needed.  8.  Zantac 150 mg twice daily.  9.  Cipro 500 mg one twice daily for five days.   The patient to discontinue Lasix, Imdur, Norvasc, Lopid, and Flagyl until  rechecked by Dr. Welton Flakes.   FOLLOWUPRinaldo Cloud in two weeks and Dr. Welton Flakes in 4-5 days.   DISCHARGE DIET:  Low fat, low salt diet.   DISCHARGE ACTIVITIES:  The patient is to increase activity slowly.  No  driving for one week.  No lifting, pulling, pushing for one week.   HISTORY:  This 60 year old white male with a past medical history of  coronary artery disease and angioplasty of LAD in 7/93 and CABG in 10/93  with LIMA to LAD and SVG to diagonal 2, had a cardiac catheterization  showing occlusion of the saphenous vein graft to diagonal vessel and had a  redo CABG in 1994 advised, but refused by the patient.  The patient  underwent a cardiac catheterization by Dr. Welton Flakes at [facility] showing 60-70% proximal stenosis followed by 100% occlusion of the  left anterior descending coronary artery, circumflex vessel with 70-75%  proximal stenosis, and obtuse marginal branch 2 with 80-85% stenosis in the  right coronary artery with 20-30% proximal, 30-40% midstenosis, followed by  100% occlusion.   PHYSICAL EXAMINATION:  The patient was alert and oriented x3 in no acute  distress.  Pulse 58, blood pressure 124/60.  Conjunctiva pink.  NECK:  Supple.  No JVD.  No bruit.  LUNGS:  Clear.  CARDIOVASCULAR:  Normal S1, S2.  No rubs or gallops.  ABDOMEN:  Soft.  Bowel sounds positive.  Nontender.  Right groin sheath site  moderate oozing without hematoma.  EXTREMITIES:  Without edema, cyanosis, or clubbing.   LABORATORY DATA:  Normal hemoglobin and WBC count, platelet count.  Normal  electrolytes, BUN, and creatinine.  Sugar borderline at 107.  CK-MB,  troponin I normal.  Cholesterol 137, triglycerides  147, with HDL cholesterol  27, and LDL cholesterol 81.  EKG revealed sinus bradycardia with a heart  rate of 44, nonspecific T wave abnormality and septal infarct.   HOSPITAL COURSE:  The patient underwent obtuse marginal branch angioplasty  using 2.5 x 30 mm Cypher stent, reducing 80-85% lesion to 0%.  Postangioplasty the patient had no significant abnormality except for sinus  bradycardia, heart rate had improved to 56, but some of her medications were  held due to low blood pressure.  She was discharged home in satisfactory  condition.  Also some of the medications were changed to generic versions at  the patient's request.  She was strongly advised to follow with primary care  physician in 4-5 days and increase medications gradually as tolerated.      Ricki Rodriguez, M.D.  Electronically Signed     ASK/MEDQ  D:  03/21/2005  T:  03/21/2005  Job:  [id number]   cc:   Freda Munro  Fax: [phone]   Eduardo Osier. Sharyn Lull, M.D.  Fax: [phone]

## 2010-09-07 NOTE — Cardiovascular Report (Signed)
NAME:  Willie Clark, Willie Clark                 ACCOUNT NO.:  [id number]   MEDICAL RECORD NO.:  [id number]          PATIENT TYPE:  INP   LOCATION:  6523                         FACILITY:  [facility]   PHYSICIAN:  Mohan N. Sharyn Lull, M.D. DATE OF BIRTH:  21-Jul-1950   DATE OF PROCEDURE:  11/30/2004  DATE OF DISCHARGE:                              CARDIAC CATHETERIZATION   PROCEDURES:  1.  Successful PTCA to proximal left circumflex using 2.5 x 9 mm long      Maverick balloon.  2.  Successful PTCA to proximal OM2 using 3.0 x 9 mm long Maverick balloon.  3.  Successful deployment of 3.0 x 18 mm long Cypher drug-eluting stent in      proximal and ostial OM2.  4.  Successful deployment of 2.5 x 13 mm long Cypher drug-eluting stent in      proximal left circumflex.   INDICATIONS FOR PROCEDURE:  Willie Clark is 60 year old white male with past  medical history significant for coronary artery disease status post PCI to  LAD in July 1993. Subsequently, he had restenosis requiring CABG in October  1993. He had LIMA to LAD and saphenous vein graft to diagonal-2. The patient  had history of MI at catheterization at [facility] which showed occluded saphenous  vein graft and was advised for redo CABG in 1994, but the patient refused.  The patient also has a history of hypertension, hypercholesteremia, history  of tobacco abuse, depression. Complains of retrosternal chest pain radiating  to the jaw associated with shortness of breath and diaphoresis with minimal  exertion almost every day.   The patient underwent stress Myoview on November 28, 2004 which showed more  than 3 mm ST depression in inferolateral leads and was admitted at [facility] and subsequently had left catheterization on November 29, 2004 which LV showed inflow mid and apical wall hypokinesia, EF of  approximately 50%. Left main had 10-15% ostial stenosis. LAD has 60-70%  proximal stenosis and then was 100% occluded filling by IMA.  Diagonal  one  and two were 100% occluded. Left circumflex had 70-75% proximal bifurcation  stenosis with OM2. High OM1 was small which was patent. OM2 has ostial 80-  85% stenosis and 70% proximal stenosis. OM3 was very small which has 60-70%  diffuse disease. RCA had 20-30% proximal and 30-40% midstenosis and was 100%  occluded distally beyond the midportion which appears to be chronically  occluded. Saphenous vein graft to diagonal-2 was 100% occluded at the  ostium. LIMA to LAD was patent.   PAST MEDICAL HISTORY:  As above.   PAST SURGICAL HISTORY:  He had coronary artery bypass grafting x2 in October  1993 as above.   ALLERGIES:  No known drug allergies.   MEDICATIONS:  1.  Atenolol 15 milligrams p.o. q.d.  2.  Norvasc 5 milligrams p.o. q.d.  3.  Imdur 60 milligrams p.o. q.d.  4.  Ranitidine 150 milligrams p.o. q.d.  5.  Baby aspirin 81 milligrams p.o. q.d.  6.  Plavix 75 milligrams p.o. q.d.  7.  Lopid 600 milligrams p.o. b.i.d.  8.  Lasix 20 milligrams p.o. q.d.  9.  Zoloft 100 milligrams p.o. q.d.  10. Lipitor 40 milligrams p.o. q.d.  11. Cipro and Flagyl.   SOCIAL HISTORY:  He is married and has one child on disability. Smoked two  and a half pack per day for 30 years, quit 13 years ago. Used to drink beer  socially.   FAMILY HISTORY:  Father died of MI at age of 84 and had bypass in his 36s.  Mother is alive. She is 72. She has arthritis. One brother died of cancer  and one sister died of motor vehicle accident.   PHYSICAL EXAMINATION:  GENERAL:  He was alert and oriented x3 in no acute  distress.  VITAL SIGNS:  Blood pressure was 124/60, pulse was 58 regular.  HEENT:  Conjunctivae was pink.  NECK:  Supple. No JVD, no bruit.  LUNGS:  Lungs are clear to auscultation without rhonchi or rales.  CARDIOVASCULAR:  S1 and S2 was normal. There was no S3 gallop. There was no  murmur.  ABDOMEN:  Abdomen was soft. Bowel sounds were present, nontender. Right  groin sheath site  showed he had moderate site oozing but no evidence of  hematoma.  EXTREMITIES:  There is no clubbing, cyanosis or edema.   IMPRESSION:  1.  Unstable angina status post left catheterization at [facility]  2.  Coronary artery disease status post coronary artery bypass graft x2.  3.  History of myocardial infarction.  4.  Hypertension.  5.  Hypercholesteremia.  6.  History of tobacco abuse.  7.  Positive family history of coronary artery disease.  8.  Questionable urinary tract infection.   Discussed with the patient and his wife regarding various options of  treatment, i.e. medical versus invasive high risk PCI to left circumflex and  OM2 versus redo CABG with complete revascularization, possibly obtuse  marginal and left circumflex system and PDA. Its risks and benefits i.e.  death, MI, stroke, need for emergency CABG, risks of high risk of restenosis  as this is a bifurcation lesion, local vascular complications. The patient  states he absolutely does not want redo CABG unless there is complication  during the PCI and consented for the procedure.   PROCEDURE:  After obtaining the informed consent, the patient was brought to  the cath lab and was placed on fluoroscopy table. The right groin was  prepped and draped in usual fashion. 2% Xylocaine was used for local  anesthesia in the right groin. With the help of the thin-wall needle, a 7-  French arterial and 6-French venous sheaths were placed. Both the sheaths  were aspirated and flushed. Next, a 7-French 3.0 XB  guiding catheter was  advanced over the wire under fluoroscopic guidance up to the ascending  aorta. Wire was pulled out, the catheter was aspirated and connected to the  manifold. Catheter was further advanced and engaged into left coronary  ostium. Multiple views of the left system were taken.  FINDINGS:  Left main had 10-15% stenosis. Proximal circumflex lesion appears  to be 80-85% stenosis.  Ostial OM2 is 70-80% and then proximal OM2 has 60-70%  stenosis. Distally the left circumflex system is diffusely diseased and  small vessel.   INTERVENTIONAL PROCEDURE:  Successful PTCA to proximal left circumflex was  done using 2.5 x 9 mm long Maverick balloon using double wire technique  going up to 10 and 15 atmospheres pressure. Then successful PTCA to proximal  OM2 was done using 3.0 x 9 mm long Maverick balloon for predilatation and  then attempted to deploy of 3.0 x 18 mm long Cypher drug-eluting stent  without success despite changing to stiffer wires. Then PTCA to proximal  left circumflex was done initially using 2.5 x 9 mm long Quantum Maverick  balloon and then 2.5 x 8 mm long PowerSail balloon and 2.5 x 9 mm long  noncompliant Ranger balloon going up to 26 atmospheres of pressure. Then  finally 3.0 x 18 mm long Cypher drug-eluting stent was deployed in ostial  and proximal left circumflex using kissing balloon in the left circumflex.  The stent was deployed in ostial and proximal OM2 at 11 atmospheres of  pressure, which was fully expanded going up to 18 atmospheres of pressure  and simultaneous 2.5 x 12 mm long Maverick balloon was inflated in the left  circumflex. Again both these balloons, stent balloon and left circumflex  balloon, where inflated alternatively opposing the stent to the wall of the  ostium OM2. OM2 lesion was reduced from 80-85% to 0% residual with excellent  TIMI grade III distal flow without evidence of dissection or distal  embolization. Then 2.5 x 13 mm long Cypher drug-eluting stent was deployed  in proximal left circumflex up to 11 atmospheres of pressure which was fully  expanded going up to 15 atmospheres of pressure. Stent was postdilated using  2.5 x 9 mm long Quantum Maverick balloon going up to 20-24 atmospheres of  pressure. Left circumflex lesion was dilated from 85-90% to less than 20%  residual with excellent TIMI grade III distal flow  without evidence of  dissection or distal embolization. The lesion in the proximal circumflex was  very calcified and required a high pressures as above.   The patient received weight-based heparin, Integrilin and 300 milligrams of  Plavix during the procedure. The patient tolerated procedure well. There  were no complications. The patient did receive approximately 500 cc of dye.  The patient will be monitored closely. The patient's urine output has been  adequate. We will do hourly urine output monitoring. The patient was  transferred to recovery room in stable condition.       MNH/MEDQ  D:  11/30/2004  T:  12/01/2004  Job:  [id number]   cc:   Andree Moro, M.D.  [facility]

## 2010-09-07 NOTE — Discharge Summary (Signed)
NAME:  Willie Clark, Willie Clark NO.:  [id number]   MEDICAL RECORD NO.:  [id number]          PATIENT TYPE:  INP   LOCATION:  6527                         FACILITY:  [facility]   PHYSICIAN:  Nicki Guadalajara, M.D.     DATE OF BIRTH:  [birth year]   DATE OF ADMISSION:  10/10/2005  DATE OF DISCHARGE:  10/11/2005                                 DISCHARGE SUMMARY   DISCHARGE DIAGNOSES:  1.  Anginal equivalent with symptoms of reflux disease and dyspnea on      exertion.  2.  Progressive coronary disease.  Cardiac catheterization was done October 09, 2005, with stenosis above his grafts and his circumflex and his OM2.      Undergoing PCI to the left circumflex vessel with 95% hazy stenosis      within the stented segment being reduced to 50% to 60% and a distal      circumflex stenosis prior to a bifurcation of 90% being reduced to less      than 10%, though unable to dilate the ostium and circumflex.  Also 70%      left main stenosis.  3.  Coronary artery disease with history of coronary artery bypass grafting      x2 vessels and a total saphenous vein graft to his diagonal 6 months      after his coronary artery bypass graft.  4.  Hyperlipidemia.  5.  Hypertension.  6.  History of cerebrovascular accident.  7.  Obstructive sleep apnea with CPAP at home.   DISCHARGE CONDITION:  Stable.   PROCEDURES:  PCI by Dr. Tresa Endo on October 10, 2005, with results as stated.   HISTORY OF PRESENT ILLNESS:  This 60 year old white married male had a  cardiac catheterization at [facility] by Dr. Park Breed on October 09, 2005.  Referred to Dr. Tresa Endo for possible PCI.  The patient's cath revealed  his LIMA to the LAD being patent, his saphenous vein graft to diagonal 2 was  totaled which has been known since 1993 and his mid RC artery was totaled.  He had several lesions proximally that are chronic.  He also has new lesions  distal to his proximal circumflex stent, proximal to his ostial OM2  stent.  The stents were placed November 30, 2004, by Dr. Sharyn Lull and according to  records, the patient was recommended for redo CABG in 1994 to capture a  saphenous vein graft to his diagonal 2 that was found to be occluded, but  the patient refused.   Prior to this procedure, the patient had been having severe indigestion and  epigastric discomfort with radiation to his right neck and jaw.  He also has  dyspnea on exertion.  He has not used any nitro.   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  He has  had 2 PET scans and is evaluated by nurse  practitioner from an oncology unit at [facility] in [place].  The patient also has chronic pain, he has depression and sleep apnea.  He  had CPAP, he handed it back in, though at times he does have one to use.  He  also has financial difficulties with medicines.   OUTPATIENT MEDICATIONS:  1.  Accupril 10 a day.  2.  Tylenol 25.  3.  Lipitor 80.  4.  Lyrica 150 b.i.d.  5.  Furosemide 20 p.r.n.  6.  Zoloft 100 daily.  7.  Plavix 75 daily.  8.  Omeprazole 20 daily.  9.  Aspirin 325 daily.   ALLERGIES:  No known allergies.  No allergy to fish, iodine or shellfish.   FAMILY HISTORY:  See H&P.   SOCIAL HISTORY:  See H&P.   REVIEW OF SYSTEMS:  See H&P.   PHYSICAL EXAM AT DISCHARGE:  VITAL SIGNS:  Blood pressure 114/64, pulse 70,  respirations regular, temperature 98.  HEART:  Regular rate and rhythm.  CHEST:  Clear.  GROIN:  Stable.  No hematoma.  No tenderness.   LABORATORY DATA:  Hemoglobin 13.7, hematocrit 40.3, WBC 4.7, platelets  167,000.  Protime 12.4, INR 0.9, PTT 26.  Sodium 140, potassium 4.6,  chloride 107, CO2 of 30, glucose 111, BUN 11,  creatinine 1.1, calcium 9.1.  UA was negative.  At discharge, hemoglobin 13, hematocrit 37.  CK-MB was  negative.  Chest x-ray:  No acute abnormality.  EKG:  Post procedure sinus  brady, otherwise normal.  Followup on the 22nd, sinus rhythm, septal  infarct, age undetermined, but no acute changes.   DISCHARGE MEDICATIONS:  1.  Atenolol 50 mg twice a day.  2.  Imdur 60 daily.  3.  Aspirin 325 mg daily.  4.  Plavix 75 daily.   Please note, I discussed with patient with severe reflux disease that  aspirin and Plavix may be contributing to his GI complaints.  He is on  Prilosec or Prevacid, whichever one he has at the time.  He may need a  different medication.   1.  Lyrica 150 twice  a day.  2.  Prilosec 20 or Prevacid 30 daily.  3.  Accupril 10 mg daily.  4.  Zoloft 100 mg daily.  5.  Lipitor 80 mg daily.  6.  Furosemide 20 as needed as before.  7.  Nitroglycerin 1/150 sublingual p.r.n. chest pain.   DISCHARGE INSTRUCTIONS:  1.  Low fat, low salt diet.  2.  Wash right groin catheterization site with soap and water.  Call if any      bleeding, swelling or drainage.  3.  Increase activity slowly.  4.  See Dr. Park Breed next week.  Call his office today for that appointment.   HOSPITAL COURSE:  Willie Clark came into short stay electively for intervention  to circumflex vessel.  He was also found to have a 70% left main stenosis.   The patient tolerated the procedure well.  It was a quite lengthy procedure.  He did well.  He went to 6500 and by the morning of October 11, 2005, he was  seen by Dr. Jacinto Halim and actually Dr. Tresa Endo Both.  Lengthy discussion by Dr.  Tresa Endo concerning his coronary disease and the possibility of needing redo  bypass grafting in the future.  He will follow up with Dr. Park Breed as stated.      Darcella Gasman. [place], [place].P.  ______________________________  Nicki Guadalajara, M.D.    LRI/MEDQ  D:  10/16/2005  T:  10/16/2005  Job:  [id number]  cc:   Jonnie Kind   Dr. Orlando Penner, [place]

## 2010-09-07 NOTE — Cardiovascular Report (Signed)
NAME:  Willie Clark, Willie Clark.:  [id number]   MEDICAL RECORD NO.:  [id number]          PATIENT TYPE:  INP   LOCATION:  6527                         FACILITY:  [facility]   PHYSICIAN:  Nicki Guadalajara, M.D.     DATE OF BIRTH:  [birth year]   DATE OF PROCEDURE:  DATE OF DISCHARGE:                              CARDIAC CATHETERIZATION   INDICATIONS:  [name] is a 60 year old patient of Dr. Welton Flakes in  [place].  Apparently in 1993, the patient had previously undergone CABG  revascularization surgery at [facility] with a LIMA to his LAD and SVG to another  vessel which the reports are conflicting (in one report, it says that this  had done to a diagonal two-vessel, in another report this had said this had  gone elsewhere).  In any event, six months after his CABG, the SVG was  occluded.  Apparently in August 2006, Dr. Sharyn Lull placed stents in his  proximal circumflex and OM vessel.  A review of his notes indicates that the  circumflex was significantly calcified.  He had a 2.5 x 13 mm Cypher stent  inserted in the proximal circumflex and 3 x 13 mm Cypher stent inserted in  the large OM1 vessel.  There was difficulty in full expansion of the  proximal circumflex stent by Dr. Sharyn Lull.  The patient did continue to  experience episodes of chest pain.  He recently had seen Dr. Welton Flakes and  yesterday, June 20, underwent cardiac catheterization at [facility].  Dr. Welton Flakes reviewed the films with Dr. Alanda Amass.  Apparently due to  high-grade 95% stenoses within the proximal circumflex stent and also at the  ostium of the OM vessel, the decision was made by Dr. Welton Flakes and Dr. Alanda Amass  to attempt difficult complex intervention.  Due to scheduling difficulties,  Dr. Alanda Amass asked that I perform the procedure instead.  As a result, I  saw the patient yesterday and discussed the procedure in detail.  Yesterday,  he was given Plavix 300 mg and nitrates were added to his medical  regimen.  He is now presents to [facility] today for attempted intervention  of his circumflex vessel.   PROCEDURE:  After premedication with Versed 2 mg intravenously, the patient  was prepped and draped in usual fashion.  He received an additional 150 mg  oral Plavix.  He was given double bolus Integrilin and weight-adjusted  heparinization.  ACT was documented to be therapeutic.  The right femoral  artery was punctured anteriorly and 6-French sheath was inserted.  Initially  a 6-French sheath was inserted and a 6-French FL-4 guide was used for the  procedure.  An  angiography was performed which confirmed the 95% narrowing  within the proximal stented segment in the region of the other stent with  extensive calcification. probable extraluminally and also 80-90% and an  ostial OM stenosis.  There was also 90% stenosis in the distal AV groove  circumflex before bifurcation.  The  collaterals were present to the RCA.  (There also were extensive collaterals from the LIMA to LAD  to distal RCA  noted on the film from yesterday.)  However, angiography today also revealed  ostial left main disease of at least 60 to less than 70%, which was smooth.  Due to the very sharp 90 degree long left main, 90 degrees takeoff of the  circumflex and 90 degree takeoff of the OM vessel, the procedure was very  difficult.  There initially were initial issues with catheter backup  support.  Initially a Luge wire was advanced down the circumflex into the  distal circumflex.  Another Luge wire was attempted to be inserted and  advanced inserted into the OM vessel.  Due to significant narrowing in the  proximal segment, the tip of the wire was able to get into the OM vessel but  this was never able to traverse within the stented segment.  A 2.0 x 15  Monoril Maverick balloon was then inserted to help with support to this  wire.  However, again, due to 90 degree takeoff of the circumflex arising  from the  left main, this was never able to allow the second Luge wire to get  into the OM vessel with the wire hanging up on the inferior proximal  stenosis strut.  This wire was then removed.  The 2.0 balloon was advanced  on the initial Luge wire.  Attention was initially directed at the distal  circumflex lesion.  Multiple inflations were made at 3, 5, 6, 7, 8  atmospheres.  An angiography confirmed a good angiographic result at this  distal site.  The balloon was also used for dilatation in the proximal  circumflex stented segment in the region where the OM stent arose.  After  initial dilatation, it was felt that cutting balloon would be the best  strategy to try to open up the high-grade residual lesion.  Unfortunately,  due to the sharp 90 degree angle, a 2.75 x 6 cutting balloon catheter was  never able to cross into the proximal stented segment of the circumflex due  to the __________  getting caught on the proximal stent strut laterally.  Multiple additional wires were inserted for buddy wire technique but again  despite this cutting balloon was unsuccessful in going into the stented  segment.  Consequently, this was then removed.  Additional wires were used  for more support, including Clinical cytogeneticist and ultimately [employer].  A  3.0 x 12 mm Quantum balloon was then used and inserted and advanced in the  proximal circumflexed stented segment.  Multiple dilatations were done.  The  balloon was then pulled back for better visualization of the lesion.  However, the balloon was then not able to be re-advanced due to the wiggling  of the balloon tip at the site of the proximal strut.  At this time, there  also was difficulty with back catheter backup support.  The initial guide  had also been exchanged previously for a __________ with side holes.  It was  this guide that was getting soft.  Another attempt was made since dilatation was done in the proximal circumflex stent to reinsert the  2.75 x 6 mm  cutting balloon, since again it was felt due to the lesion characteristics  that this would be necessary to open up the waist which still remained in  the balloon segment.  Again despite multiple attempts, the cutting balloon  was never able to be advanced within the stented segment.  Further attempts  were also made with buddy wire support, including  a wiggle wire as well as  the [employer] and also an additional Luge.  At this point, a new 3.0 x 12  mm Sprinter MX2 balloon was inserted.  Dilatation was done in the proximal  circumflex stent up to 13 atmospheres.  Again a significant waist still  appeared within this region.  The balloon was then removed.  A Quantum 3.25  x 8 mm balloon was then inserted in an attempt to provide greater force to  open up the waist.  Again there was difficulty in getting this initial  balloon past the initial proximal strokes, but ultimately this was  successful.  Multiple dilatations were made up to 17-18 atmospheres  corresponding to approximately 3.3 mm size with this 3.25 mm balloon in the  previously placed 2.5 mm Cypher stent.  Again there appeared to be  significant extraluminal compression due to calcification such that the  waist  would not budge.  It was felt that the risk of increased pressure  would potentially be associated with increased risk of vessel perforation  and consequently, this was not done.  The after dilatation with this area,  again another further attempt was made to reinsert the cutting balloon over  a wiggle wire to attempt to try to get this off the initial stent strut, but  again the nose cone were never able to get be advanced into the proximal  circumflex stented segment.  Multiple additional attempts were then made to  wire the obtuse marginal vessel.  Again due to consecutive 90 degrees  takeoffs in an inflexible vessel, the wire was never able to be advanced  into the marginal vessel.  At this point, the  patient had been on the table  for over 3 hours, had received a moderate amount of contrast.  The patient  was aware of the entire procedure throughout its course.  It was felt that  with his left main disease as well as with his circumflex disease and  occluded right, that he may ultimately require redo CABG revascularization  surgery.  However, the angioplasty result in the distal circumflex was  satisfactory and there was significant improvement in the proximal stented  segment from 95% to proximally 50-60% with brisk TIMI III flow down the  circumflex as well as the marginal vessel.  After further trying to place a  wire down the marginal vessel in attempt to at least do PTCA to this site,  this also was unsuccessful.  It was felt that it was in the patient's best  interest to terminate the procedure.  He was pain-free.  He tolerated the  procedure well.  Returned to his room in satisfactory condition.  HEMODYNAMIC DATA:  Central aortic pressure was 105/67.   ANGIOGRAPHIC DATA:  Please refer to Dr. Milta Deiters diagnostic catheterization  report.  However, at the start of the intervention, angiography did suggest  ostial narrowing of his left main coronary artery of 60-70%.  The left  circumflex vessel was calcified proximally, fairly extensively.  There was  mild 10% - 20% eccentric shelf proximal to the stented segment in the  ostium.  Within the proximal stented 2.55 x 13 mm stent, there was 95%  stenosis with haziness.  On selective views, there also appeared to be 90%  eccentric focal narrowing at the ostium of the circumflex marginal vessel  stented segment.  This first circumflex marginal vessel was large caliber.  The mid circumflex after a more distal branch had 90% stenosis prior  to its  bifurcation.  Collaterals were present to the distal RCA.  Following long  arduous attempt at coronary intervention, the 90% distal circumflex lesion  prior to the bifurcation was reduced to less  than 10%.  There was TIMI III  flow.  There is no evidence for dissection.  The proximal circumflex area  remained extensively calcified and a waist continued to appear despite high  level balloon inflation up to 3.3 mm using a 3.25 x 8 mm Quantum balloon.  The 95% stenosis was however, improved and reduced to 50-60%.  There still  appeared to be significant of calcification outside the stented segment.  the ostium of the circumflex marginal still had residual 80% narrowing but  there was brisk TIMI III flow.   IMPRESSION:  Very difficult percutaneous coronary intervention to the left  circumflex vessel with 95% hazy stenosis within the stented segment being  reduced to 50-60% and a distal circumflex stenosis prior to a bifurcation of  90% being reduced to less than 10%, with the inability to dilate the ostium  of the circumflex most likely due to mechanical difficulty resulting from  stent strut and inability to wire the circumflex lesion.   DISCUSSION:  Mr. 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  In retrospect, rotational atherectomy in August 2006 prior to stenting may  have afforded a more optimal stent apposition and stent expansion.  At the  completion of the procedure done by Dr. Sharyn Lull, there still was residual  lesion within this proximal circumflex stent most likely due to this  extrinsic calcification.  Dr. Sharyn Lull had dilated up to 20-24 atmospheres of  pressure with inability to fully expand this site.  In addition, the patient  does have ostial left main narrowing to 70%.  Hopefully he will obtained  some benefit from this arduous procedure today.  However, if he continues to  experience increasing symptomatology, consideration for redo CABG surgery to  his RCA and circumflex system should be considered.           ______________________________  Nicki Guadalajara, M.D.     TK/MEDQ  D:  10/10/2005  T:  10/10/2005  Job:  [id number]  cc:   Welton Flakes, M.D.   [employer]

## 2011-07-08 ENCOUNTER — Encounter: Payer: Self-pay | Admitting: Cardiovascular Disease

## 2011-08-01 ENCOUNTER — Encounter: Payer: Self-pay | Admitting: Cardiovascular Disease

## 2011-08-05 ENCOUNTER — Encounter: Payer: Self-pay | Admitting: Cardiovascular Disease

## 2011-08-05 ENCOUNTER — Ambulatory Visit (INDEPENDENT_AMBULATORY_CARE_PROVIDER_SITE_OTHER): Payer: Medicare Other | Admitting: Cardiovascular Disease

## 2011-08-05 VITALS — BP 124/82 | HR 58 | Ht 69.0 in | Wt 199.2 lb

## 2011-08-05 DIAGNOSIS — R001 Bradycardia, unspecified: Secondary | ICD-10-CM

## 2011-08-05 DIAGNOSIS — I498 Other specified cardiac arrhythmias: Secondary | ICD-10-CM

## 2011-08-05 DIAGNOSIS — I10 Essential (primary) hypertension: Secondary | ICD-10-CM

## 2011-08-05 DIAGNOSIS — E785 Hyperlipidemia, unspecified: Secondary | ICD-10-CM

## 2011-08-05 DIAGNOSIS — I2510 Atherosclerotic heart disease of native coronary artery without angina pectoris: Secondary | ICD-10-CM

## 2011-08-05 NOTE — Patient Instructions (Signed)
Follow up in 6 months 

## 2011-08-05 NOTE — Assessment & Plan Note (Signed)
The patient appears to be stable overall. His most recent cardiac catheterization was last year which showed patent grafts. He has chronic intermittent chest pain some of this might be related to his fibromyalgia. He has not had any prolonged episodes of chest discomfort. I recommend continuing medical therapy at this point.

## 2011-08-05 NOTE — Assessment & Plan Note (Signed)
His blood pressure is well controlled. Continue current medications. 

## 2011-08-05 NOTE — Assessment & Plan Note (Signed)
Continue treatment with Crestor 20 mg once daily. His last lipid profile is not available for review. I recommend a target LDL of less than 70.

## 2011-08-05 NOTE — Progress Notes (Signed)
HPI  Willie Clark is a 61 year old gentleman who is here today for a followup visit. He is very well known to me and has known history of coronary artery disease. He is status post coronary artery bypass graft surgery. Most recent cardiac catheterization was last year which showed significant native three-vessel coronary artery disease with patent grafts. He suffers from fibromyalgia and chronic pain. He gets occasional chest pain especially at night which last for a few minutes and resolve spontaneously. He really has not had to use nitroglycerin. He has been having problems with right-sided numbness and balance issues. He is being evaluated by Dr. Sherryll Burger at [facility] neurology. He had an EMG done which was overall normal. He also had an MRI performed. He tries to exercise for 20 minutes 3 times a week.  Allergies  Allergen Reactions  . [employer]   . Fish Oil      Current Outpatient Prescriptions on File Prior to Visit  Medication Sig Dispense Refill  . acetaminophen (TYLENOL) 500 MG tablet Take 500 mg by mouth every 6 (six) hours as needed.        [name] amLODipine (NORVASC) 2.5 MG tablet Take 2.5 mg by mouth daily.        [name] aspirin 325 MG tablet Take 325 mg by mouth daily.        . enalapril (VASOTEC) 5 MG tablet Take 5 mg by mouth daily.        . fexofenadine (ALLEGRA) 180 MG tablet Take 180 mg by mouth daily.      . furosemide (LASIX) 40 MG tablet Take 40 mg by mouth daily as needed.        . gabapentin (NEURONTIN) 600 MG tablet Take 800 mg by mouth 3 (three) times daily.       . metoprolol (TOPROL-XL) 50 MG 24 hr tablet Take 25 mg by mouth daily.       . nitroGLYCERIN (NITROSTAT) 0.4 MG SL tablet Place 0.4 mg under the tongue every 5 (five) minutes as needed.        . pantoprazole (PROTONIX) 40 MG tablet Take 40 mg by mouth daily.        . rosuvastatin (CRESTOR) 20 MG tablet Take 20 mg by mouth daily.           Past Medical History  Diagnosis Date  . Chronic chest pain   . Stroke   .  Sleep apnea   . Depression   . Bowel obstruction 02/2009    small bowel  . Pulmonary embolism 01/2006    Post-op/treated  . Lightheadedness   . SOB (shortness of breath)   . Syncope and collapse   . Coronary artery disease 1993    MI   . Fibromyalgia   . Hypertension   . Hyperlipidemia      Past Surgical History  Procedure Date  . Bowel obstruction 01/2009  . Shoulder surgery 2010  . Neck surgery 06/2009  . Coronary artery bypass graft 1993/01/2006    redo at [facility]. LIMA to LAD, SVG to OM2 and SVG to RPDA  . Cardiac catheterization 11/2006    patent grafts. Significant OM3 disease and occluded diagonals.  . Cardiac catheterization 06/2010    patent grafts. significant ISR in proximal LCX but OM2 is bypassed and gives retrograde flow to OM3.      Family History  Problem Relation Age of Onset  . Heart disease Father 60     History   Social History  .  Marital Status: Married    Spouse Name: N/A    Number of Children: N/A  . Years of Education: N/A   Occupational History  . Not on file.   Social History Main Topics  . Smoking status: Former Smoker    Types: Cigarettes  . Smokeless tobacco: Not on file  . Alcohol Use: No  . Drug Use: No  . Sexually Active:    Other Topics Concern  . Not on file   Social History Narrative  . No narrative on file     PHYSICAL EXAM   BP 124/82  Pulse 58  Ht 5\' 9"  (1.753 m)  Wt 199 lb 4 oz (90.379 kg)  BMI 29.42 kg/m2  Constitutional: He is oriented to person, place, and time. He appears well-developed and well-nourished. No distress.  HENT: No nasal discharge.  Head: Normocephalic and atraumatic.  Eyes: Pupils are equal and round. Right eye exhibits no discharge. Left eye exhibits no discharge.  Neck: Normal range of motion. Neck supple. No JVD present. No thyromegaly present.  Cardiovascular: Normal rate, regular rhythm, normal heart sounds and. Exam reveals no gallop and no friction rub. No murmur heard.    Pulmonary/Chest: Effort normal and breath sounds normal. No stridor. No respiratory distress. He has no wheezes. He has no rales. He exhibits no tenderness.  Abdominal: Soft. Bowel sounds are normal. He exhibits no distension. There is no tenderness. There is no rebound and no guarding.  Musculoskeletal: Normal range of motion. He exhibits no edema and no tenderness.  Neurological: He is alert and oriented to person, place, and time. Coordination normal.  Skin: Skin is warm and dry. No rash noted. He is not diaphoretic. No erythema. No pallor.  Psychiatric: He has a normal mood and affect. His behavior is normal. Judgment and thought content normal.      EKG: Normal sinus rhythm with no significant ST or T wave changes.   ASSESSMENT AND PLAN

## 2011-11-20 ENCOUNTER — Telehealth: Payer: Self-pay | Admitting: Cardiovascular Disease

## 2011-11-20 NOTE — Telephone Encounter (Signed)
Pt's wife says pt has been c/o unilateral neck pain for the last few days. She says he has also been more tired than usual. She tells me he has fibromyalgia, but says neck pain is different/worse than other pain. She also says he has had increased LE edema. She is unsure of his BP since they do not take it consistently.  He remains on amlodipine. Since these are new symptoms, I advised pt to come in to be seen by Dr. Kirke Corin Scheduled for tomm at 0900. I advised wife to have pt go to ER between now and appt if he begins to feel worse.  Understanding verb.

## 2011-11-20 NOTE — Telephone Encounter (Signed)
Pt wife called stating that he is complaining with legs swelling and neck hurting. Pt does have a hx of fibromyalgia.

## 2011-11-21 ENCOUNTER — Encounter: Payer: Self-pay | Admitting: Cardiovascular Disease

## 2011-11-21 ENCOUNTER — Ambulatory Visit (INDEPENDENT_AMBULATORY_CARE_PROVIDER_SITE_OTHER): Payer: Medicare Other | Admitting: Cardiovascular Disease

## 2011-11-21 VITALS — BP 140/80 | HR 69 | Ht 70.0 in | Wt 200.8 lb

## 2011-11-21 DIAGNOSIS — R079 Chest pain, unspecified: Secondary | ICD-10-CM

## 2011-11-21 DIAGNOSIS — R0602 Shortness of breath: Secondary | ICD-10-CM

## 2011-11-21 DIAGNOSIS — I2510 Atherosclerotic heart disease of native coronary artery without angina pectoris: Secondary | ICD-10-CM

## 2011-11-21 DIAGNOSIS — E785 Hyperlipidemia, unspecified: Secondary | ICD-10-CM

## 2011-11-21 DIAGNOSIS — R0989 Other specified symptoms and signs involving the circulatory and respiratory systems: Secondary | ICD-10-CM

## 2011-11-21 DIAGNOSIS — R0600 Dyspnea, unspecified: Secondary | ICD-10-CM | POA: Insufficient documentation

## 2011-11-21 MED ORDER — POTASSIUM CHLORIDE CRYS ER 20 MEQ PO TBCR: 20.0000 meq | EXTENDED_RELEASE_TABLET | Freq: Every day | ORAL | Status: DC

## 2011-11-21 MED ORDER — ENALAPRIL MALEATE 20 MG PO TABS: 20.0000 mg | ORAL_TABLET | Freq: Every day | ORAL | Status: DC

## 2011-11-21 NOTE — Assessment & Plan Note (Signed)
The patient has worsening dyspnea associated with lower extremity edema. There is evidence of mild fluid overload. Previous ejection fraction was normal. I recommend an echocardiogram. Some of his edema might be related to amlodipine. Thus, I will stop this medication today and increase enalapril to 20 mg once daily. I will obtain basic metabolic profile and BNP in one week. I also asked him to take Lasix 40 mg once daily and will put him on K-Dur 20 mEq once daily.

## 2011-11-21 NOTE — Patient Instructions (Addendum)
Stop Amlodipine.  Increase Enalapril to 20 mg once daily.  Take Furosemide (Lasix ) 40 mg once daily.  Start K-dur (Potassuim) 20 meq once daily.   Labs in 1 week.   Your physician has requested that you have an echocardiogram. Echocardiography is a painless test that uses sound waves to create images of your heart. It provides your doctor with information about the size and shape of your heart and how well your heart's chambers and valves are working. This procedure takes approximately one hour. There are no restrictions for this procedure.  Your physician has requested that you have a lexiscan myoview. For further information please visit [email or web address] Please follow instruction sheet, as given.  Follow up after tests.

## 2011-11-21 NOTE — Assessment & Plan Note (Signed)
Continue treatment with Crestor 20 mg once daily.   

## 2011-11-21 NOTE — Assessment & Plan Note (Signed)
The patient has known history of coronary artery disease status post CABG. He has worsening chest pain. It's difficult situation to determine whether this is due to his fibromyalgia or underlying obstructive coronary artery disease. However, given the worsening and change of pattern recently, further ischemic cardiac evaluation is recommended. He is not able to exercise on a treadmill due to chronic back and lower extremity discomfort. Thus, I recommend a pharmacologic nuclear stress test for further evaluation.

## 2011-11-21 NOTE — Progress Notes (Signed)
HPI  Willie Clark is a 61 year old gentleman who is here today for a followup visit. He is very well known to me and has known history of coronary artery disease. He is status post coronary artery bypass graft surgery. Most recent cardiac catheterization was in March of 2012 which showed significant native three-vessel coronary artery disease with patent grafts. He suffers from fibromyalgia and chronic pain. He is here today on an urgent basis. He started having increased episodes of substernal chest discomfort associated with significant dyspnea. The chest pain can happen at rest and with activities and radiates to both arms. He has noticed worsening lower extremity edema. He usually takes Lasix 40 mg once daily as needed but has not done that recently due to worsening cramping when he takes it.  Allergies  Allergen Reactions  . [employer]   . Fish Oil      Current Outpatient Prescriptions on File Prior to Visit  Medication Sig Dispense Refill  . acetaminophen (TYLENOL) 500 MG tablet Take 500 mg by mouth every 6 (six) hours as needed.        [name] aspirin 325 MG tablet Take 325 mg by mouth daily.        . citalopram (CELEXA) 20 MG tablet Takes 2 tablets daily.      . fexofenadine (ALLEGRA) 180 MG tablet Take 180 mg by mouth daily.      . furosemide (LASIX) 40 MG tablet Take 40 mg by mouth daily as needed.        . nitroGLYCERIN (NITROSTAT) 0.4 MG SL tablet Place 0.4 mg under the tongue every 5 (five) minutes as needed.        . nortriptyline (PAMELOR) 25 MG capsule Take 25 mg by mouth daily.      . pantoprazole (PROTONIX) 40 MG tablet Take 40 mg by mouth daily.        . rosuvastatin (CRESTOR) 20 MG tablet Take 20 mg by mouth daily.        . SUCRALFATE PO Take by mouth 4 (four) times daily.      . potassium chloride SA (K-DUR,KLOR-CON) 20 MEQ tablet Take 1 tablet (20 mEq total) by mouth daily.  30 tablet  6     Past Medical History  Diagnosis Date  . Chronic chest pain   . Stroke   .  Sleep apnea   . Depression   . Bowel obstruction 02/2009    small bowel  . Pulmonary embolism 01/2006    Post-op/treated  . Lightheadedness   . SOB (shortness of breath)   . Syncope and collapse   . Coronary artery disease 1993    MI   . Fibromyalgia   . Hypertension   . Hyperlipidemia      Past Surgical History  Procedure Date  . Bowel obstruction 01/2009  . Shoulder surgery 2010  . Neck surgery 06/2009  . Coronary artery bypass graft 1993/01/2006    redo at [facility]. LIMA to LAD, SVG to OM2 and SVG to RPDA  . Cardiac catheterization 11/2006    patent grafts. Significant OM3 disease and occluded diagonals.  . Cardiac catheterization 06/2010    patent grafts. significant ISR in proximal LCX but OM2 is bypassed and gives retrograde flow to OM3.      Family History  Problem Relation Age of Onset  . Heart disease Father 53     History   Social History  . Marital Status: Married    Spouse Name: N/A  Number of Children: N/A  . Years of Education: N/A   Occupational History  . Not on file.   Social History Main Topics  . Smoking status: Former Smoker    Types: Cigarettes  . Smokeless tobacco: Not on file  . Alcohol Use: No  . Drug Use: No  . Sexually Active:    Other Topics Concern  . Not on file   Social History Narrative  . No narrative on file     PHYSICAL EXAM   BP 140/80  Pulse 69  Ht 5\' 10"  (1.778 m)  Wt 200 lb 12 oz (91.06 kg)  BMI 28.80 kg/m2 Constitutional: He is oriented to person, place, and time. He appears well-developed and well-nourished. No distress.  HENT: No nasal discharge.  Head: Normocephalic and atraumatic.  Eyes: Pupils are equal and round. Right eye exhibits no discharge. Left eye exhibits no discharge.  Neck: Normal range of motion. Neck supple. No JVD present. No thyromegaly present.  Cardiovascular: Normal rate, regular rhythm, normal heart sounds and. Exam reveals no gallop and no friction rub. No murmur heard.    Pulmonary/Chest: Effort normal and breath sounds normal. No stridor. No respiratory distress. He has no wheezes. He has no rales. He exhibits no tenderness.  Abdominal: Soft. Bowel sounds are normal. He exhibits no distension. There is no tenderness. There is no rebound and no guarding.  Musculoskeletal: Normal range of motion. He exhibits +1 edema and no tenderness.  Neurological: He is alert and oriented to person, place, and time. Coordination normal.  Skin: Skin is warm and dry. No rash noted. He is not diaphoretic. No erythema. No pallor.  Psychiatric: He has a normal mood and affect. His behavior is normal. Judgment and thought content normal.       EKG: Sinus  Rhythm  WITHIN NORMAL LIMITS   ASSESSMENT AND PLAN

## 2011-11-25 ENCOUNTER — Other Ambulatory Visit: Payer: Self-pay

## 2011-11-25 ENCOUNTER — Other Ambulatory Visit: Payer: Medicare Other

## 2011-11-25 ENCOUNTER — Other Ambulatory Visit (INDEPENDENT_AMBULATORY_CARE_PROVIDER_SITE_OTHER): Payer: Medicare Other

## 2011-11-25 DIAGNOSIS — R0609 Other forms of dyspnea: Secondary | ICD-10-CM

## 2011-11-25 DIAGNOSIS — R0602 Shortness of breath: Secondary | ICD-10-CM

## 2011-11-25 DIAGNOSIS — R609 Edema, unspecified: Secondary | ICD-10-CM

## 2011-11-25 MED ORDER — ENALAPRIL MALEATE 20 MG PO TABS: 20.0000 mg | ORAL_TABLET | Freq: Every day | ORAL | Status: DC

## 2011-11-25 MED ORDER — POTASSIUM CHLORIDE CRYS ER 20 MEQ PO TBCR: 20.0000 meq | EXTENDED_RELEASE_TABLET | Freq: Every day | ORAL | Status: DC

## 2011-11-25 MED ORDER — FUROSEMIDE 40 MG PO TABS: 40.0000 mg | ORAL_TABLET | Freq: Every day | ORAL | Status: DC | PRN

## 2011-11-25 NOTE — Addendum Note (Signed)
Addended by: Vinh Sachs E on: 11/25/2011 12:45 PM   Modules accepted: Orders

## 2011-11-26 ENCOUNTER — Ambulatory Visit: Payer: Self-pay | Admitting: Cardiovascular Disease

## 2011-11-26 DIAGNOSIS — R079 Chest pain, unspecified: Secondary | ICD-10-CM

## 2011-12-02 ENCOUNTER — Telehealth: Payer: Self-pay

## 2011-12-02 NOTE — Telephone Encounter (Signed)
Message copied by Zola Button on Mon Dec 02, 2011  1:34 PM ------      Message from: Lorine Bears A      Created: Mon Dec 02, 2011 12:16 PM      Regarding: stress test       His stress test was slightly abnormal. I want him to follow up with me to discuss results.

## 2011-12-02 NOTE — Telephone Encounter (Signed)
Pt was given stress test results and then transferred to make an appt with Dr Kirke Corin.

## 2011-12-05 ENCOUNTER — Encounter: Payer: Self-pay | Admitting: Cardiovascular Disease

## 2011-12-05 ENCOUNTER — Ambulatory Visit (INDEPENDENT_AMBULATORY_CARE_PROVIDER_SITE_OTHER): Payer: Medicare Other | Admitting: Cardiovascular Disease

## 2011-12-05 VITALS — BP 110/78 | HR 66 | Ht 69.0 in | Wt 199.5 lb

## 2011-12-05 DIAGNOSIS — R079 Chest pain, unspecified: Secondary | ICD-10-CM

## 2011-12-05 DIAGNOSIS — E785 Hyperlipidemia, unspecified: Secondary | ICD-10-CM

## 2011-12-05 DIAGNOSIS — I10 Essential (primary) hypertension: Secondary | ICD-10-CM

## 2011-12-05 DIAGNOSIS — I2510 Atherosclerotic heart disease of native coronary artery without angina pectoris: Secondary | ICD-10-CM

## 2011-12-05 MED ORDER — RANOLAZINE ER 500 MG PO TB12: 500.0000 mg | ORAL_TABLET | Freq: Two times a day (BID) | ORAL | Status: DC

## 2011-12-05 NOTE — Assessment & Plan Note (Signed)
Chest pain seems to be slightly better. His stress test showed small to moderate size anterolateral ischemia which correlates with his known occluded diagonal branches which were not bypassed. Continue medical therapy. I will add Ranexa 500 mg twice daily. I asked him to call his if he feels better with this medication in order to increase the dose of thousand milligrams twice daily. That his angina gets worse, then the next that would be to proceed with cardiac catheterization. I will reevaluate his symptoms in 3 months.

## 2011-12-05 NOTE — Assessment & Plan Note (Signed)
Continue treatment with Crestor 20 mg once daily.

## 2011-12-05 NOTE — Progress Notes (Signed)
HPI  Willie Clark is a 61 year old gentleman who is here today for a followup visit. He is very well known to me and has known history of coronary artery disease. He is status post coronary artery bypass graft surgery. Most recent cardiac catheterization was in March of 2012 which showed significant native three-vessel coronary artery disease with patent grafts. Diagonal branches are known to be occluded and not bypassed. He suffers from fibromyalgia and chronic pain.  He was seen recently for worsening chest pain, dyspnea and lower extremity edema. His BNP was normal. I stopped his amlodipine asked him to take Lasix 40 mg once daily in a regular basis. He underwent an echocardiogram which showed mildly reduced LV systolic function with an ejection fraction of 45-50% with mild mitral regurgitation. He had a pharmacologic nuclear stress test which showed evidence of anterolateral ischemia. Overall, he feels slightly better. His edema has improved after the recent changes.  Allergies  Allergen Reactions  . [employer]   . Fish Oil      Current Outpatient Prescriptions on File Prior to Visit  Medication Sig Dispense Refill  . acetaminophen (TYLENOL) 500 MG tablet Take 500 mg by mouth every 6 (six) hours as needed.        [name] aspirin 325 MG tablet Take 325 mg by mouth daily.        . citalopram (CELEXA) 20 MG tablet Takes 2 tablets daily.      . enalapril (VASOTEC) 20 MG tablet Take 1 tablet (20 mg total) by mouth daily.  30 tablet  6  . fexofenadine (ALLEGRA) 180 MG tablet Take 180 mg by mouth daily.      . furosemide (LASIX) 40 MG tablet Take 1 tablet (40 mg total) by mouth daily as needed.  30 tablet  3  . gabapentin (NEURONTIN) 800 MG tablet Take 800 mg by mouth 3 (three) times daily.      . Methylcellulose, Laxative, (SOLUBLE FIBER PO) Take by mouth daily.      . metoprolol succinate (TOPROL-XL) 25 MG 24 hr tablet Take 25 mg by mouth daily.      . nitroGLYCERIN (NITROSTAT) 0.4 MG SL tablet  Place 0.4 mg under the tongue every 5 (five) minutes as needed.        . nortriptyline (PAMELOR) 25 MG capsule Take 25 mg by mouth daily.      . pantoprazole (PROTONIX) 40 MG tablet Take 40 mg by mouth daily.        . potassium chloride SA (K-DUR,KLOR-CON) 20 MEQ tablet Take 1 tablet (20 mEq total) by mouth daily.  30 tablet  6  . rosuvastatin (CRESTOR) 20 MG tablet Take 20 mg by mouth daily.        . SUCRALFATE PO Take by mouth 4 (four) times daily.      . vitamin B-12 (CYANOCOBALAMIN) 1000 MCG tablet Take 1,000 mcg by mouth daily.      . ranolazine (RANEXA) 500 MG 12 hr tablet Take 1 tablet (500 mg total) by mouth 2 (two) times daily.         Past Medical History  Diagnosis Date  . Chronic chest pain   . Stroke   . Sleep apnea   . Depression   . Bowel obstruction 02/2009    small bowel  . Pulmonary embolism 01/2006    Post-op/treated  . Lightheadedness   . SOB (shortness of breath)   . Syncope and collapse   . Coronary artery disease 1993  MI   . Fibromyalgia   . Hypertension   . Hyperlipidemia      Past Surgical History  Procedure Date  . Bowel obstruction 01/2009  . Shoulder surgery 2010  . Neck surgery 06/2009  . Coronary artery bypass graft 1993/01/2006    redo at [facility]. LIMA to LAD, SVG to OM2 and SVG to RPDA  . Cardiac catheterization 11/2006    patent grafts. Significant OM3 disease and occluded diagonals.  . Cardiac catheterization 06/2010    patent grafts. significant ISR in proximal LCX but OM2 is bypassed and gives retrograde flow to OM3.      Family History  Problem Relation Age of Onset  . Heart disease Father 85     History   Social History  . Marital Status: Married    Spouse Name: N/A    Number of Children: N/A  . Years of Education: N/A   Occupational History  . Not on file.   Social History Main Topics  . Smoking status: Former Smoker -- 2.5 packs/day    Types: Cigarettes    Quit date: 08/21/1991  . Smokeless tobacco: Not on file    . Alcohol Use: No  . Drug Use: No  . Sexually Active:    Other Topics Concern  . Not on file   Social History Narrative  . No narrative on file      PHYSICAL EXAM   BP 110/78  Pulse 66  Ht 5\' 9"  (1.753 m)  Wt 199 lb 8 oz (90.493 kg)  BMI 29.46 kg/m2  Constitutional: He is oriented to person, place, and time. He appears well-developed and well-nourished. No distress.  HENT: No nasal discharge.  Head: Normocephalic and atraumatic.  Eyes: Pupils are equal and round. Right eye exhibits no discharge. Left eye exhibits no discharge.  Neck: Normal range of motion. Neck supple. No JVD present. No thyromegaly present.  Cardiovascular: Normal rate, regular rhythm, normal heart sounds and. Exam reveals no gallop and no friction rub. No murmur heard.  Pulmonary/Chest: Effort normal and breath sounds normal. No stridor. No respiratory distress. He has no wheezes. He has no rales. He exhibits no tenderness.  Abdominal: Soft. Bowel sounds are normal. He exhibits no distension. There is no tenderness. There is no rebound and no guarding.  Musculoskeletal: Normal range of motion. He exhibits trace edema and no tenderness.  Neurological: He is alert and oriented to person, place, and time. Coordination normal.  Skin: Skin is warm and dry. No rash noted. He is not diaphoretic. No erythema. No pallor.  Psychiatric: He has a normal mood and affect. His behavior is normal. Judgment and thought content normal.    EKG: Normal sinus rhythm with nonspecific T wave changes.    ASSESSMENT AND PLAN

## 2011-12-05 NOTE — Assessment & Plan Note (Signed)
His blood pressure is well controlled. Continue current medications. 

## 2011-12-05 NOTE — Patient Instructions (Addendum)
Start Ranexa 500 mg twice daily. If you feel better with this, let us know so we send you a proscription.   Follow up in 3 months

## 2011-12-19 ENCOUNTER — Other Ambulatory Visit: Payer: Self-pay | Admitting: Cardiovascular Disease

## 2011-12-19 DIAGNOSIS — R079 Chest pain, unspecified: Secondary | ICD-10-CM

## 2011-12-24 ENCOUNTER — Other Ambulatory Visit: Payer: Self-pay | Admitting: Cardiovascular Disease

## 2011-12-24 MED ORDER — RANOLAZINE ER 500 MG PO TB12: 500.0000 mg | ORAL_TABLET | Freq: Two times a day (BID) | ORAL | Status: DC

## 2011-12-24 NOTE — Telephone Encounter (Signed)
Please send medication to [facility] to be refilled per patient's wife Liborio Nixon

## 2011-12-24 NOTE — Telephone Encounter (Signed)
Refill sent for Ranexa 500 mg take one tablet twice a day.

## 2012-01-15 ENCOUNTER — Ambulatory Visit: Payer: Self-pay | Admitting: Gastroenterology

## 2012-01-22 ENCOUNTER — Encounter: Payer: Self-pay | Admitting: *Deleted

## 2012-01-22 ENCOUNTER — Telehealth: Payer: Self-pay | Admitting: *Deleted

## 2012-01-22 NOTE — Telephone Encounter (Signed)
Faxed back request for cardiac clearance, see letter.

## 2012-02-18 ENCOUNTER — Ambulatory Visit: Payer: Self-pay | Admitting: Gastroenterology

## 2012-03-09 ENCOUNTER — Ambulatory Visit: Payer: Medicare Other | Admitting: Cardiovascular Disease

## 2012-03-16 ENCOUNTER — Encounter: Payer: Self-pay | Admitting: Cardiovascular Disease

## 2012-03-16 ENCOUNTER — Ambulatory Visit (INDEPENDENT_AMBULATORY_CARE_PROVIDER_SITE_OTHER): Payer: Medicare Other | Admitting: Cardiovascular Disease

## 2012-03-16 VITALS — BP 102/70 | HR 63 | Ht 69.5 in | Wt 201.0 lb

## 2012-03-16 DIAGNOSIS — I10 Essential (primary) hypertension: Secondary | ICD-10-CM

## 2012-03-16 DIAGNOSIS — I2510 Atherosclerotic heart disease of native coronary artery without angina pectoris: Secondary | ICD-10-CM

## 2012-03-16 DIAGNOSIS — E785 Hyperlipidemia, unspecified: Secondary | ICD-10-CM

## 2012-03-16 MED ORDER — RANOLAZINE ER 1000 MG PO TB12: 1000.0000 mg | ORAL_TABLET | Freq: Two times a day (BID) | ORAL | Status: DC

## 2012-03-16 NOTE — Assessment & Plan Note (Signed)
Blood pressure is well controlled 

## 2012-03-16 NOTE — Progress Notes (Signed)
HPI  Willie Clark is a 61 year old gentleman who is here today for a followup visit. He has known history of coronary artery disease. He is status post coronary artery bypass graft surgery. Most recent cardiac catheterization was in March of 2012 which showed significant native three-vessel coronary artery disease with patent grafts. Diagonal branches are known to be occluded and not bypassed. He suffers from fibromyalgia and chronic pain.  He was seen recently for worsening chest pain, dyspnea and lower extremity edema. His BNP was normal. I stopped his amlodipine asked him to take Lasix 40 mg once daily on a regular basis. He underwent an echocardiogram which showed mildly reduced LV systolic function with an ejection fraction of 45-50% with mild mitral regurgitation. He had a pharmacologic nuclear stress test which showed evidence of anterolateral ischemia and was treated medically. During last visit, I started him on Ranexa 500 mg twice daily. He reports improvement in chest pain. Overall he is feeling better. He had an EGD done recently and was found to have a large gastric ulcer which was not cancerous. The dose of aspirin was decreased to 81 mg daily.  Allergies  Allergen Reactions  . [employer]   . Fish Oil      Current Outpatient Prescriptions on File Prior to Visit  Medication Sig Dispense Refill  . acetaminophen (TYLENOL) 500 MG tablet Take 500 mg by mouth every 6 (six) hours as needed.        . citalopram (CELEXA) 20 MG tablet Takes 2 tablets daily.      . enalapril (VASOTEC) 20 MG tablet Take 1 tablet (20 mg total) by mouth daily.  30 tablet  6  . fexofenadine (ALLEGRA) 180 MG tablet Take 180 mg by mouth daily.      . furosemide (LASIX) 40 MG tablet Take 1 tablet (40 mg total) by mouth daily as needed.  30 tablet  3  . gabapentin (NEURONTIN) 800 MG tablet Take 800 mg by mouth 3 (three) times daily.      . Methylcellulose, Laxative, (SOLUBLE FIBER PO) Take by mouth daily.      .  metoprolol succinate (TOPROL-XL) 25 MG 24 hr tablet Take 25 mg by mouth daily.      . nitroGLYCERIN (NITROSTAT) 0.4 MG SL tablet Place 0.4 mg under the tongue every 5 (five) minutes as needed.        . nortriptyline (PAMELOR) 25 MG capsule Take 25 mg by mouth daily.      . pantoprazole (PROTONIX) 40 MG tablet Take 40 mg by mouth daily.        . potassium chloride SA (K-DUR,KLOR-CON) 20 MEQ tablet Take 1 tablet (20 mEq total) by mouth daily.  30 tablet  6  . rosuvastatin (CRESTOR) 20 MG tablet Take 20 mg by mouth daily.        . SUCRALFATE PO Take by mouth 4 (four) times daily.      . vitamin B-12 (CYANOCOBALAMIN) 1000 MCG tablet Take 1,000 mcg by mouth daily.         Past Medical History  Diagnosis Date  . Chronic chest pain   . Stroke   . Sleep apnea   . Depression   . Bowel obstruction 02/2009    small bowel  . Pulmonary embolism 01/2006    Post-op/treated  . Lightheadedness   . SOB (shortness of breath)   . Syncope and collapse   . Coronary artery disease 1993    MI   .  Fibromyalgia   . Hypertension   . Hyperlipidemia      Past Surgical History  Procedure Date  . Bowel obstruction 01/2009  . Shoulder surgery 2010  . Neck surgery 06/2009  . Coronary artery bypass graft 1993/01/2006    redo at [facility]. LIMA to LAD, SVG to OM2 and SVG to RPDA  . Cardiac catheterization 11/2006    patent grafts. Significant OM3 disease and occluded diagonals.  . Cardiac catheterization 06/2010    patent grafts. significant ISR in proximal LCX but OM2 is bypassed and gives retrograde flow to OM3.      Family History  Problem Relation Age of Onset  . Heart disease Father 16     History   Social History  . Marital Status: Married    Spouse Name: N/A    Number of Children: N/A  . Years of Education: N/A   Occupational History  . Not on file.   Social History Main Topics  . Smoking status: Former Smoker -- 2.5 packs/day    Types: Cigarettes    Quit date: 08/21/1991  . Smokeless  tobacco: Not on file  . Alcohol Use: No  . Drug Use: No  . Sexually Active:    Other Topics Concern  . Not on file   Social History Narrative  . No narrative on file      PHYSICAL EXAM   BP 102/70  Pulse 63  Ht 5' 9.5" (1.765 m)  Wt 201 lb (91.173 kg)  BMI 29.26 kg/m2  Constitutional: He is oriented to person, place, and time. He appears well-developed and well-nourished. No distress.  HENT: No nasal discharge.  Head: Normocephalic and atraumatic.  Eyes: Pupils are equal and round. Right eye exhibits no discharge. Left eye exhibits no discharge.  Neck: Normal range of motion. Neck supple. No JVD present. No thyromegaly present.  Cardiovascular: Normal rate, regular rhythm, normal heart sounds and. Exam reveals no gallop and no friction rub. No murmur heard.  Pulmonary/Chest: Effort normal and breath sounds normal. No stridor. No respiratory distress. He has no wheezes. He has no rales. He exhibits no tenderness.  Abdominal: Soft. Bowel sounds are normal. He exhibits no distension. There is no tenderness. There is no rebound and no guarding.  Musculoskeletal: Normal range of motion. He exhibits trace edema and no tenderness.  Neurological: He is alert and oriented to person, place, and time. Coordination normal.  Skin: Skin is warm and dry. No rash noted. He is not diaphoretic. No erythema. No pallor.  Psychiatric: He has a normal mood and affect. His behavior is normal. Judgment and thought content normal.    EKG: Normal sinus rhythm with nonspecific T wave changes.    ASSESSMENT AND PLAN

## 2012-03-16 NOTE — Assessment & Plan Note (Signed)
He is overall feeling better after the addition of Ranexa. I will increase the dose of 8000 mg twice daily.

## 2012-03-16 NOTE — Patient Instructions (Addendum)
Increase Ranexa to 1000 mg twice daily.  Follow up in 6 months.

## 2012-03-16 NOTE — Assessment & Plan Note (Signed)
Continue treatment with rosuvastatin 20 mg daily.

## 2012-04-20 ENCOUNTER — Ambulatory Visit: Payer: Self-pay | Admitting: Unknown Physician Specialty

## 2012-05-05 ENCOUNTER — Ambulatory Visit: Payer: Self-pay | Admitting: Gastroenterology

## 2012-05-06 LAB — PATHOLOGY REPORT

## 2012-05-12 ENCOUNTER — Telehealth: Payer: Self-pay | Admitting: *Deleted

## 2012-05-12 NOTE — Telephone Encounter (Signed)
I spoke with pt's wife who says they went to Dr. Orpah Cobb office this am and was seen by NP, Foye Clock. Says they were told they need to see Dr. Kirke Corin d/t "possible blockage in his stomach". i asked for further clarification, but wife was unsure as to what they were talking about. I told her I would call Foye Clock and see and call her back.

## 2012-05-12 NOTE — Telephone Encounter (Signed)
I spoke with pt's wife I explained plan  I explained Dr. Kirke Corin does not have any openings until next week here in [place], but he does have an opening in [place] She is willing to have pt come to [place] to discuss CP and nausea per [facility]

## 2012-05-12 NOTE — Telephone Encounter (Signed)
I spoke with Foye Clock, Dr. Orpah Cobb NP. She says pt has had 2 EGDs both showing an epigastric ulcer but not responding to meds Pt still c/o nausea and CP and she is wondering if the symptoms are more r/t cardiac causes versus GI issues since ulcer is remaining unchanged Says pt is scheduled for abdominal CT and other tests next week but she thinks pt should see Dr. Kirke Corin for continued c/o CP and nausea.  I will call wife back about this

## 2012-05-12 NOTE — Telephone Encounter (Signed)
Pt wife calling say that pt is at Dr Philis Fendt office and they say pt need to be seen due to stomach issues and they feel he has a blockage. Dr Kirke Corin doesn't have any appts available til 1/30. Please advise

## 2012-05-13 ENCOUNTER — Ambulatory Visit (INDEPENDENT_AMBULATORY_CARE_PROVIDER_SITE_OTHER): Payer: Medicare Other | Admitting: Cardiovascular Disease

## 2012-05-13 ENCOUNTER — Encounter: Payer: Self-pay | Admitting: Cardiovascular Disease

## 2012-05-13 VITALS — BP 111/74 | HR 71 | Wt 196.0 lb

## 2012-05-13 DIAGNOSIS — I2510 Atherosclerotic heart disease of native coronary artery without angina pectoris: Secondary | ICD-10-CM

## 2012-05-13 DIAGNOSIS — I10 Essential (primary) hypertension: Secondary | ICD-10-CM

## 2012-05-13 DIAGNOSIS — R109 Unspecified abdominal pain: Secondary | ICD-10-CM

## 2012-05-13 DIAGNOSIS — R079 Chest pain, unspecified: Secondary | ICD-10-CM

## 2012-05-13 DIAGNOSIS — E785 Hyperlipidemia, unspecified: Secondary | ICD-10-CM

## 2012-05-13 NOTE — Patient Instructions (Addendum)
Your physician recommends that you schedule a follow-up appointment in: 6 weeks in the [employer] office 06/29/12 at 8:45am)  [facility] phone number: [phone] [facility])

## 2012-05-14 ENCOUNTER — Other Ambulatory Visit: Payer: Self-pay | Admitting: Gastroenterology

## 2012-05-14 LAB — CLOSTRIDIUM DIFFICILE BY PCR

## 2012-05-15 ENCOUNTER — Ambulatory Visit: Payer: Self-pay | Admitting: Gastroenterology

## 2012-05-16 ENCOUNTER — Encounter: Payer: Self-pay | Admitting: Cardiovascular Disease

## 2012-05-16 DIAGNOSIS — R109 Unspecified abdominal pain: Secondary | ICD-10-CM | POA: Insufficient documentation

## 2012-05-16 LAB — STOOL CULTURE

## 2012-05-16 NOTE — Assessment & Plan Note (Signed)
His abdominal pain is likely noncardiac. His different from his prior angina. I suspect a GI etiology. ECG does not show any acute changes. He is scheduled to have CT abdomen and pelvis as well as gastric emptying study. I will hold off on any further cardiac workup at this time until his GI evaluation is finished.

## 2012-05-16 NOTE — Assessment & Plan Note (Signed)
Continue medical therapy 

## 2012-05-16 NOTE — Assessment & Plan Note (Signed)
Blood pressure is well controlled on current medications. 

## 2012-05-16 NOTE — Assessment & Plan Note (Signed)
Continue treatment with Crestor. 

## 2012-05-16 NOTE — Progress Notes (Signed)
HPI  [name] is a 62 year old gentleman who is here today for a followup visit. He has known history of coronary artery disease. He is status post coronary artery bypass graft surgery. Most recent cardiac catheterization was in March of 2012 which showed significant native three-vessel coronary artery disease with patent grafts. Diagonal branches are known to be occluded and not bypassed. He suffers from fibromyalgia and chronic pain.  He was seen on August for worsening chest pain, dyspnea and lower extremity edema. His BNP was normal. I stopped his amlodipine asked him to take Lasix 40 mg once daily on a regular basis. He underwent an echocardiogram which showed mildly reduced LV systolic function with an ejection fraction of 45-50% with mild mitral regurgitation. He had a pharmacologic nuclear stress test which showed evidence of anterolateral ischemia correlating with his most recent cardiac catheterization and was treated medically. He had a lot of GI symptoms recently with abdominal pain. EGD showed gastric ulcer. He informs me that the ulcer has not healed yet. He continues to complain of epigastric pain radiating to her chest area at rest which does not worsen with physical activities. He was referred back to me to see if the symptoms are cardiac in nature. However, these current symptoms are different from his prior angina.  Allergies  Allergen Reactions  . [employer]   . Fish Oil      Current Outpatient Prescriptions on File Prior to Visit  Medication Sig Dispense Refill  . acetaminophen (TYLENOL) 500 MG tablet Take 500 mg by mouth every 6 (six) hours as needed.        [name] aspirin EC 81 MG tablet Take 81 mg by mouth daily.      . citalopram (CELEXA) 20 MG tablet Takes 2 tablets daily.      . enalapril (VASOTEC) 20 MG tablet Take 1 tablet (20 mg total) by mouth daily.  30 tablet  6  . fexofenadine (ALLEGRA) 180 MG tablet Take 180 mg by mouth daily.      . furosemide (LASIX) 40 MG  tablet Take 1 tablet (40 mg total) by mouth daily as needed.  30 tablet  3  . gabapentin (NEURONTIN) 800 MG tablet Take 800 mg by mouth 3 (three) times daily.      . Methylcellulose, Laxative, (SOLUBLE FIBER PO) Take by mouth daily.      . metoprolol succinate (TOPROL-XL) 25 MG 24 hr tablet Take 25 mg by mouth daily.      . nitroGLYCERIN (NITROSTAT) 0.4 MG SL tablet Place 0.4 mg under the tongue every 5 (five) minutes as needed.        . nortriptyline (PAMELOR) 25 MG capsule Take 25 mg by mouth daily.      . pantoprazole (PROTONIX) 40 MG tablet Take 40 mg by mouth daily.        . potassium chloride SA (K-DUR,KLOR-CON) 20 MEQ tablet Take 1 tablet (20 mEq total) by mouth daily.  30 tablet  6  . rosuvastatin (CRESTOR) 20 MG tablet Take 20 mg by mouth daily.        . SUCRALFATE PO Take by mouth 4 (four) times daily.      . vitamin B-12 (CYANOCOBALAMIN) 1000 MCG tablet Take 1,000 mcg by mouth daily.         Past Medical History  Diagnosis Date  . Chronic chest pain   . Stroke   . Sleep apnea   . Depression   . Bowel obstruction 02/2009  small bowel  . Pulmonary embolism 01/2006    Post-op/treated  . Lightheadedness   . SOB (shortness of breath)   . Syncope and collapse   . Coronary artery disease 1993    MI   . Fibromyalgia   . Hypertension   . Hyperlipidemia      Past Surgical History  Procedure Date  . Bowel obstruction 01/2009  . Shoulder surgery 2010  . Neck surgery 06/2009  . Coronary artery bypass graft 1993/01/2006    redo at [facility]. LIMA to LAD, SVG to OM2 and SVG to RPDA  . Cardiac catheterization 11/2006    patent grafts. Significant OM3 disease and occluded diagonals.  . Cardiac catheterization 06/2010    patent grafts. significant ISR in proximal LCX but OM2 is bypassed and gives retrograde flow to OM3.      Family History  Problem Relation Age of Onset  . Heart disease Father 23     History   Social History  . Marital Status: Married    Spouse Name:  N/A    Number of Children: N/A  . Years of Education: N/A   Occupational History  . Not on file.   Social History Main Topics  . Smoking status: Former Smoker -- 2.5 packs/day    Types: Cigarettes    Quit date: 08/21/1991  . Smokeless tobacco: Not on file  . Alcohol Use: No  . Drug Use: No  . Sexually Active:    Other Topics Concern  . Not on file   Social History Narrative  . No narrative on file      PHYSICAL EXAM   BP 111/74  Pulse 71  Wt 196 lb (88.905 kg)  Constitutional: He is oriented to person, place, and time. He appears well-developed and well-nourished. No distress.  HENT: No nasal discharge.  Head: Normocephalic and atraumatic.  Eyes: Pupils are equal and round. Right eye exhibits no discharge. Left eye exhibits no discharge.  Neck: Normal range of motion. Neck supple. No JVD present. No thyromegaly present.  Cardiovascular: Normal rate, regular rhythm, normal heart sounds and. Exam reveals no gallop and no friction rub. No murmur heard.  Pulmonary/Chest: Effort normal and breath sounds normal. No stridor. No respiratory distress. He has no wheezes. He has no rales. He exhibits no tenderness.  Abdominal: Soft. Bowel sounds are normal. He exhibits no distension. There is no tenderness. There is no rebound and no guarding.  Musculoskeletal: Normal range of motion. He exhibits trace edema and no tenderness.  Neurological: He is alert and oriented to person, place, and time. Coordination normal.  Skin: Skin is warm and dry. No rash noted. He is not diaphoretic. No erythema. No pallor.  Psychiatric: He has a normal mood and affect. His behavior is normal. Judgment and thought content normal.    EKG: Normal sinus rhythm with nonspecific T wave changes.    ASSESSMENT AND PLAN

## 2012-05-27 ENCOUNTER — Ambulatory Visit: Payer: Self-pay | Admitting: Gastroenterology

## 2012-06-22 ENCOUNTER — Encounter: Payer: Self-pay | Admitting: *Deleted

## 2012-06-22 ENCOUNTER — Ambulatory Visit: Payer: Self-pay | Admitting: Gastroenterology

## 2012-06-29 ENCOUNTER — Ambulatory Visit: Payer: Medicare Other | Admitting: Cardiovascular Disease

## 2012-08-07 ENCOUNTER — Ambulatory Visit: Payer: Self-pay | Admitting: Anesthesiology

## 2012-08-07 LAB — ELECTROLYTE PANEL
Anion Gap: 4 — ABNORMAL LOW (ref 7–16)
Chloride: 100 mmol/L (ref 98–107)
Co2: 34 mmol/L — ABNORMAL HIGH (ref 21–32)
Potassium: 4.1 mmol/L (ref 3.5–5.1)

## 2012-08-10 ENCOUNTER — Ambulatory Visit: Payer: Self-pay | Admitting: Unknown Physician Specialty

## 2012-11-14 ENCOUNTER — Ambulatory Visit: Payer: Self-pay | Admitting: Neurology

## 2012-11-14 LAB — CREATININE, SERUM
Creatinine: 1.37 mg/dL — ABNORMAL HIGH (ref 0.60–1.30)
EGFR (Non-African Amer.): 55 — ABNORMAL LOW

## 2012-11-23 ENCOUNTER — Encounter: Payer: Self-pay | Admitting: Neurology

## 2012-12-15 ENCOUNTER — Other Ambulatory Visit: Payer: Self-pay | Admitting: Gastroenterology

## 2012-12-21 ENCOUNTER — Encounter: Payer: Self-pay | Admitting: Neurology

## 2012-12-24 ENCOUNTER — Ambulatory Visit: Payer: Self-pay | Admitting: Neurology

## 2012-12-24 LAB — CREATININE, SERUM
Creatinine: 1.26 mg/dL (ref 0.60–1.30)
EGFR (African American): >60

## 2013-01-11 ENCOUNTER — Ambulatory Visit: Payer: Self-pay | Admitting: Gastroenterology

## 2013-01-12 LAB — PATHOLOGY REPORT

## 2013-01-15 ENCOUNTER — Ambulatory Visit: Payer: Self-pay | Admitting: Gastroenterology

## 2013-01-20 ENCOUNTER — Encounter: Payer: Self-pay | Admitting: Neurology

## 2013-02-16 ENCOUNTER — Ambulatory Visit: Payer: Self-pay | Admitting: Urology

## 2013-06-04 ENCOUNTER — Ambulatory Visit: Payer: Medicare Other | Admitting: Cardiovascular Disease

## 2013-06-07 ENCOUNTER — Encounter: Payer: Self-pay | Admitting: Cardiovascular Disease

## 2013-06-07 ENCOUNTER — Ambulatory Visit (INDEPENDENT_AMBULATORY_CARE_PROVIDER_SITE_OTHER): Payer: Commercial Managed Care - HMO | Admitting: Cardiovascular Disease

## 2013-06-07 VITALS — BP 142/88 | HR 69 | Ht 69.5 in | Wt 198.3 lb

## 2013-06-07 DIAGNOSIS — R079 Chest pain, unspecified: Secondary | ICD-10-CM

## 2013-06-07 DIAGNOSIS — I2510 Atherosclerotic heart disease of native coronary artery without angina pectoris: Secondary | ICD-10-CM

## 2013-06-07 DIAGNOSIS — I10 Essential (primary) hypertension: Secondary | ICD-10-CM

## 2013-06-07 DIAGNOSIS — E785 Hyperlipidemia, unspecified: Secondary | ICD-10-CM

## 2013-06-07 DIAGNOSIS — I739 Peripheral vascular disease, unspecified: Secondary | ICD-10-CM

## 2013-06-07 NOTE — Patient Instructions (Addendum)
Your physician has requested that you have a lexiscan myoview. For further information please visit [email or web address]. Please follow instruction sheet, as given.  Your physician has requested that you have a lower extremity arterial exercise duplex. During this test, exercise and ultrasound are used to evaluate arterial blood flow in the legs. Allow one hour for this exam. There are no restrictions or special instructions.   Follow up after tests.   [place]  Your caregiver has ordered a Stress Test with nuclear imaging. The purpose of this test is to evaluate the blood supply to your heart muscle. This procedure is referred to as a "Non-Invasive Stress Test." This is because other than having an IV started in your vein, nothing is inserted or "invades" your body. Cardiac stress tests are done to find areas of poor blood flow to the heart by determining the extent of coronary artery disease (CAD). Some patients exercise on a treadmill, which naturally increases the blood flow to your heart, while others who are  unable to walk on a treadmill due to physical limitations have a pharmacologic/chemical stress agent called Lexiscan . This medicine will mimic walking on a treadmill by temporarily increasing your coronary blood flow.   Please note: these test may take anywhere between 2-4 hours to complete  PLEASE REPORT TO [facility] AT THE FIRST DESK WILL DIRECT YOU WHERE TO GO  Date of Procedure:_____________2/20/15________________________  Arrival Time for Procedure:__________0730 am ____________________   PLEASE NOTIFY THE OFFICE AT LEAST 24 HOURS IN ADVANCE IF YOU ARE UNABLE TO KEEP YOUR APPOINTMENT.  [phone] AND  PLEASE NOTIFY NUCLEAR MEDICINE AT [facility] AT LEAST 24 HOURS IN ADVANCE IF YOU ARE UNABLE TO KEEP YOUR APPOINTMENT. [phone]  How to prepare for your Myoview test:  1. Do not eat or drink after midnight 2. No caffeine for 24 hours prior  to test 3. No smoking 24 hours prior to test. 4. Your medication may be taken with water.  If your doctor stopped a medication because of this test, do not take that medication. 5. Ladies, please do not wear dresses.  Skirts or pants are appropriate. Please wear a short sleeve shirt. 6. No perfume, cologne or lotion. 7. Wear comfortable walking shoes. No heels!

## 2013-06-07 NOTE — Assessment & Plan Note (Signed)
He is having recurrent chest pain worrisome for angina with extensive history of coronary artery disease and CABG twice. Baseline ECG is abnormal and slightly different from most recent EKG. He does have anterior T wave changes. I recommend evaluation with a pharmacologic nuclear stress test.

## 2013-06-07 NOTE — Assessment & Plan Note (Signed)
Blood pressure was controlled on current medications. 

## 2013-06-07 NOTE — Progress Notes (Signed)
HPI  Willie Clark is a 63 year old gentleman who is here today for a followup visit. He has known history of coronary artery disease. He is status post coronary artery bypass graft surgery.in 1993 and redo in 2007.  Most recent cardiac catheterization was in March of 2012 which showed significant native three-vessel coronary artery disease with patent grafts. Diagonal branches are known to be occluded and not bypassed. He suffers from fibromyalgia and chronic pain.  He also has chronic systolic heart failure with mildly reduced LV systolic function. He had GI symptoms last year due to documented gastric ulcer. This improved with treatment. Over the last few weeks, he has noticed recurrent episodes of substernal chest pain both at rest and with activities. This is described as sharp and aching substernally with no radiation. He had an episode a few weeks ago that reminded him of previous myocardial infarction. He also reports dark discoloration in the top of the left foot after he takes a shower. He complains of cold feet and calf discomfort with walking. He cannot specify the distance.  Allergies  Allergen Reactions  . [employer]   . Fish Oil      Current Outpatient Prescriptions on File Prior to Visit  Medication Sig Dispense Refill  . aspirin EC 81 MG tablet Take 81 mg by mouth daily.      . citalopram (CELEXA) 20 MG tablet Takes 2 tablets daily.      . fexofenadine (ALLEGRA) 180 MG tablet Take 180 mg by mouth daily.      . furosemide (LASIX) 40 MG tablet Take 1 tablet (40 mg total) by mouth daily as needed.  30 tablet  3  . gabapentin (NEURONTIN) 800 MG tablet Take 800 mg by mouth 3 (three) times daily.      . Methylcellulose, Laxative, (SOLUBLE FIBER PO) Take by mouth daily.      . metoprolol succinate (TOPROL-XL) 25 MG 24 hr tablet Take 25 mg by mouth daily.      . nitroGLYCERIN (NITROSTAT) 0.4 MG SL tablet Place 0.4 mg under the tongue every 5 (five) minutes as needed.        .  nortriptyline (PAMELOR) 25 MG capsule Take 25 mg by mouth daily.      . pantoprazole (PROTONIX) 40 MG tablet Take 40 mg by mouth daily.        . potassium chloride SA (K-DUR,KLOR-CON) 20 MEQ tablet Take 1 tablet (20 mEq total) by mouth daily.  30 tablet  6  . rosuvastatin (CRESTOR) 20 MG tablet Take 20 mg by mouth daily.        . SUCRALFATE PO Take by mouth 4 (four) times daily.      . vitamin B-12 (CYANOCOBALAMIN) 1000 MCG tablet Take 1,000 mcg by mouth daily.       No current facility-administered medications on file prior to visit.     Past Medical History  Diagnosis Date  . Chronic chest pain   . Stroke   . Sleep apnea   . Depression   . Bowel obstruction 02/2009    small bowel  . Pulmonary embolism 01/2006    Post-op/treated  . Lightheadedness   . SOB (shortness of breath)   . Syncope and collapse   . Coronary artery disease 1993    MI   . Fibromyalgia   . Hypertension   . Hyperlipidemia   . MI (myocardial infarction)   . OSA (obstructive sleep apnea)   . Kidney stones   .  Barrett's esophagus   . Diverticulitis   . Urticaria   . Skin lesions, generalized   . Mass on back      Past Surgical History  Procedure Laterality Date  . Bowel obstruction  01/2009  . Shoulder surgery  2010  . Neck surgery  06/2009  . Cardiac catheterization  11/2006    patent grafts. Significant OM3 disease and occluded diagonals.  . Cardiac catheterization  06/2010    patent grafts. significant ISR in proximal LCX but OM2 is bypassed and gives retrograde flow to OM3.   . Coronary artery bypass graft  1993/01/2006    redo at [facility]. LIMA to LAD, SVG to OM2 and SVG to RPDA  . Back surgery       Family History  Problem Relation Age of Onset  . Heart disease Father 16  . Heart attack Father      History   Social History  . Marital Status: Married    Spouse Name: N/A    Number of Children: N/A  . Years of Education: N/A   Occupational History  . Not on file.   Social History  Main Topics  . Smoking status: Former Smoker -- 2.50 packs/day    Types: Cigarettes    Quit date: 08/21/1991  . Smokeless tobacco: Not on file  . Alcohol Use: No  . Drug Use: No  . Sexual Activity: Not on file   Other Topics Concern  . Not on file   Social History Narrative  . No narrative on file      PHYSICAL EXAM   BP 142/88  Pulse 69  Ht 5' 9.5" (1.765 m)  Wt 198 lb 5 oz (89.954 kg)  BMI 28.88 kg/m2  Constitutional: He is oriented to person, place, and time. He appears well-developed and well-nourished. No distress.  HENT: No nasal discharge.  Head: Normocephalic and atraumatic.  Eyes: Pupils are equal and round. Right eye exhibits no discharge. Left eye exhibits no discharge.  Neck: Normal range of motion. Neck supple. No JVD present. No thyromegaly present.  Cardiovascular: Normal rate, regular rhythm, normal heart sounds and. Exam reveals no gallop and no friction rub. No murmur heard.  Pulmonary/Chest: Effort normal and breath sounds normal. No stridor. No respiratory distress. He has no wheezes. He has no rales. He exhibits no tenderness.  Abdominal: Soft. Bowel sounds are normal. He exhibits no distension. There is no tenderness. There is no rebound and no guarding.  Musculoskeletal: Normal range of motion. He exhibits trace edema and no tenderness.  Neurological: He is alert and oriented to person, place, and time. Coordination normal.  Skin: Skin is warm and dry. No rash noted. He is not diaphoretic. No erythema. No pallor.  Psychiatric: He has a normal mood and affect. His behavior is normal. Judgment and thought content normal.  Vascular: Femoral pulses are normal bilaterally. Distal pulses are not palpable. Both feet are cool to touch.  EKG: Normal sinus rhythm with T-wave inversion in the anterior leads   ASSESSMENT AND PLAN

## 2013-06-07 NOTE — Assessment & Plan Note (Signed)
Most recent lipid profile this month showed a total cholesterol of 153, triglyceride of 240, HDL of 38 and an LDL of 67. Continue treatment with rosuvastatin. I discussed the importance of diet and exercise to improve triglyceride and HDL.

## 2013-06-07 NOTE — Assessment & Plan Note (Signed)
He has diminished distal pulses. I recommend lower extremity arterial Doppler/duplex.

## 2013-06-10 ENCOUNTER — Encounter (INDEPENDENT_AMBULATORY_CARE_PROVIDER_SITE_OTHER): Payer: Commercial Managed Care - HMO

## 2013-06-10 DIAGNOSIS — I739 Peripheral vascular disease, unspecified: Secondary | ICD-10-CM

## 2013-06-10 DIAGNOSIS — I70219 Atherosclerosis of native arteries of extremities with intermittent claudication, unspecified extremity: Secondary | ICD-10-CM

## 2013-06-11 ENCOUNTER — Ambulatory Visit: Payer: Self-pay | Admitting: Cardiovascular Disease

## 2013-06-11 DIAGNOSIS — R079 Chest pain, unspecified: Secondary | ICD-10-CM | POA: Diagnosis not present

## 2013-06-14 ENCOUNTER — Other Ambulatory Visit: Payer: Self-pay

## 2013-06-14 DIAGNOSIS — R079 Chest pain, unspecified: Secondary | ICD-10-CM

## 2013-06-16 ENCOUNTER — Telehealth: Payer: Self-pay

## 2013-06-16 NOTE — Telephone Encounter (Signed)
Pt would like stress test results.  

## 2013-06-20 HISTORY — PX: CARDIAC CATHETERIZATION: SHX172

## 2013-06-22 ENCOUNTER — Encounter: Payer: Self-pay | Admitting: Cardiovascular Disease

## 2013-06-22 ENCOUNTER — Ambulatory Visit (INDEPENDENT_AMBULATORY_CARE_PROVIDER_SITE_OTHER): Payer: Commercial Managed Care - HMO | Admitting: Cardiovascular Disease

## 2013-06-22 VITALS — BP 116/78 | HR 67 | Ht 69.5 in | Wt 193.0 lb

## 2013-06-22 DIAGNOSIS — I10 Essential (primary) hypertension: Secondary | ICD-10-CM

## 2013-06-22 DIAGNOSIS — E785 Hyperlipidemia, unspecified: Secondary | ICD-10-CM

## 2013-06-22 DIAGNOSIS — R0602 Shortness of breath: Secondary | ICD-10-CM

## 2013-06-22 DIAGNOSIS — R079 Chest pain, unspecified: Secondary | ICD-10-CM

## 2013-06-22 DIAGNOSIS — Z01812 Encounter for preprocedural laboratory examination: Secondary | ICD-10-CM

## 2013-06-22 DIAGNOSIS — I2510 Atherosclerotic heart disease of native coronary artery without angina pectoris: Secondary | ICD-10-CM

## 2013-06-22 NOTE — Assessment & Plan Note (Signed)
Blood pressure is well controlled on current medications. 

## 2013-06-22 NOTE — Patient Instructions (Addendum)
[facility] Cardiac Cath Instructions   You are scheduled for a Cardiac Cath on:_________________________  Please arrive at _______am on the day of your procedure  You will need to pre-register prior to the day of your procedure.  Enter through the [employer] at [facility]  Registration is the first desk on your right.  Please take the procedure order we have given you in order to be registered appropriately  Do not eat/drink anything after midnight  Someone will need to drive you home  It is recommended someone be with you for the first 24 hours after your procedure  Wear clothes that are easy to get on/off and wear slip on shoes if possible   Medications bring a current list of all medications with you  _x__ You may take all of your medications the morning of your procedure with enough water to swallow safely   Day of your procedure: Arrive at the [facility] entrance.  Free valet service is available.  After entering the [facility] please check-in at the registration desk (1st desk on your right) to receive your armband. After receiving your armband someone will escort you to the cardiac cath/special procedures waiting area.  The usual length of stay after your procedure is about 2 to 3 hours.  This can vary.  If you have any questions, please call our office at [phone], or you may call the cardiac cath lab at [facility] directly at [phone]

## 2013-06-22 NOTE — Assessment & Plan Note (Signed)
Most recent lipid profile this month showed a total cholesterol of 153, triglyceride of 240, HDL of 38 and an LDL of 67. Continue treatment with rosuvastatin. I discussed the importance of diet and exercise to improve triglyceride and HDL.  

## 2013-06-22 NOTE — Assessment & Plan Note (Addendum)
The patient's chest pain is atypical and described as sharp pain. This is likely musculoskeletal in nature. However, he is having worsening exertional dyspnea suggestive of class III angina in spite of medical therapy. Stress test was abnormal and moderate risk suggestive of anterolateral ischemia. Ejection fraction was normal. Due to that, I recommend proceeding with cardiac catheterization and possible coronary intervention. This, benefits and alternatives were discussed with him. Continue current medications. He is going to have a followup appointment with GI in April.

## 2013-06-22 NOTE — Progress Notes (Signed)
HPI  Willie Clark is a 63 year old gentleman who is here today for a followup visit. He has known history of coronary artery disease. He is status post coronary artery bypass graft surgery.in 1993 and redo in 2007.  Most recent cardiac catheterization was in March of 2012 which showed significant native three-vessel coronary artery disease with patent grafts. Diagonal branches are known to be occluded and not bypassed. He suffers from fibromyalgia and chronic pain.  He also has chronic systolic heart failure with mildly reduced LV systolic function. He had GI symptoms last year due to documented gastric ulcer. This improved with treatment. Over the last few weeks, he has noticed recurrent episodes of substernal chest pain both at rest and with activities. This is described as sharp and aching substernally with no radiation. He had an episode a few weeks ago that reminded him of previous myocardial infarction. He underwent a nuclear stress test which was abnormal and showed mild anterolateral ischemia with normal ejection fraction (moderate risk stress test).  He continues to have sharp chest pain. He describes dyspnea with minimal activities. No further discoloration in his lower legs. Lower extremity ABI on Doppler was normal.  Allergies  Allergen Reactions  . [employer]   . Fish Oil      Current Outpatient Prescriptions on File Prior to Visit  Medication Sig Dispense Refill  . acetaminophen (TYLENOL) 325 MG tablet Take 650 mg by mouth every 6 (six) hours as needed.      [name] aspirin EC 81 MG tablet Take 81 mg by mouth daily.      . citalopram (CELEXA) 20 MG tablet Takes 2 tablets daily.      . enalapril (VASOTEC) 20 MG tablet Take 20 mg by mouth daily.       . fexofenadine (ALLEGRA) 180 MG tablet Take 180 mg by mouth daily.      . furosemide (LASIX) 40 MG tablet Take 1 tablet (40 mg total) by mouth daily as needed.  30 tablet  3  . gabapentin (NEURONTIN) 800 MG tablet Take 800 mg by  mouth 3 (three) times daily.      . Methylcellulose, Laxative, (SOLUBLE FIBER PO) Take by mouth daily.      . metoprolol succinate (TOPROL-XL) 25 MG 24 hr tablet Take 25 mg by mouth daily.      . nitroGLYCERIN (NITROSTAT) 0.4 MG SL tablet Place 0.4 mg under the tongue every 5 (five) minutes as needed.        . nortriptyline (PAMELOR) 25 MG capsule Take 25 mg by mouth daily.      . pantoprazole (PROTONIX) 40 MG tablet Take 40 mg by mouth daily.        . rosuvastatin (CRESTOR) 20 MG tablet Take 20 mg by mouth daily.        . SUCRALFATE PO Take by mouth 4 (four) times daily.      . vitamin B-12 (CYANOCOBALAMIN) 1000 MCG tablet Take 1,000 mcg by mouth daily.      . potassium chloride SA (K-DUR,KLOR-CON) 20 MEQ tablet Take 1 tablet (20 mEq total) by mouth daily.  30 tablet  6   No current facility-administered medications on file prior to visit.     Past Medical History  Diagnosis Date  . Chronic chest pain   . Stroke   . Sleep apnea   . Depression   . Bowel obstruction 02/2009    small bowel  . Pulmonary embolism 01/2006    Post-op/treated  .  Lightheadedness   . SOB (shortness of breath)   . Syncope and collapse   . Coronary artery disease 1993    MI   . Fibromyalgia   . Hypertension   . Hyperlipidemia   . MI (myocardial infarction)   . OSA (obstructive sleep apnea)   . Kidney stones   . Barrett's esophagus   . Diverticulitis   . Urticaria   . Skin lesions, generalized   . Mass on back      Past Surgical History  Procedure Laterality Date  . Bowel obstruction  01/2009  . Shoulder surgery  2010  . Neck surgery  06/2009  . Cardiac catheterization  11/2006    patent grafts. Significant OM3 disease and occluded diagonals.  . Cardiac catheterization  06/2010    patent grafts. significant ISR in proximal LCX but OM2 is bypassed and gives retrograde flow to OM3.   . Coronary artery bypass graft  1993/01/2006    redo at [facility]. LIMA to LAD, SVG to OM2 and SVG to RPDA  . Back  surgery       Family History  Problem Relation Age of Onset  . Heart disease Father 77  . Heart attack Father      History   Social History  . Marital Status: Married    Spouse Name: N/A    Number of Children: N/A  . Years of Education: N/A   Occupational History  . Not on file.   Social History Main Topics  . Smoking status: Former Smoker -- 2.50 packs/day    Types: Cigarettes    Quit date: 08/21/1991  . Smokeless tobacco: Not on file  . Alcohol Use: No  . Drug Use: No  . Sexual Activity: Not on file   Other Topics Concern  . Not on file   Social History Narrative  . No narrative on file      PHYSICAL EXAM   BP 116/78  Pulse 67  Ht 5' 9.5" (1.765 m)  Wt 193 lb (87.544 kg)  BMI 28.10 kg/m2  Constitutional: He is oriented to person, place, and time. He appears well-developed and well-nourished. No distress.  HENT: No nasal discharge.  Head: Normocephalic and atraumatic.  Eyes: Pupils are equal and round. Right eye exhibits no discharge. Left eye exhibits no discharge.  Neck: Normal range of motion. Neck supple. No JVD present. No thyromegaly present.  Cardiovascular: Normal rate, regular rhythm, normal heart sounds and. Exam reveals no gallop and no friction rub. No murmur heard.  Pulmonary/Chest: Effort normal and breath sounds normal. No stridor. No respiratory distress. He has no wheezes. He has no rales. He exhibits no tenderness.  Abdominal: Soft. Bowel sounds are normal. He exhibits no distension. There is no tenderness. There is no rebound and no guarding.  Musculoskeletal: Normal range of motion. He exhibits trace edema and no tenderness.  Neurological: He is alert and oriented to person, place, and time. Coordination normal.  Skin: Skin is warm and dry. No rash noted. He is not diaphoretic. No erythema. No pallor.  Psychiatric: He has a normal mood and affect. His behavior is normal. Judgment and thought content normal.  Vascular: Femoral pulses  are normal bilaterally. Distal pulses are not palpable. Both feet are cool to touch.  EKG: Normal sinus rhythm with T-wave inversion in the anterior leads   ASSESSMENT AND PLAN

## 2013-06-23 ENCOUNTER — Ambulatory Visit: Payer: Self-pay | Admitting: Cardiovascular Disease

## 2013-06-23 ENCOUNTER — Other Ambulatory Visit: Payer: Self-pay

## 2013-06-23 ENCOUNTER — Telehealth: Payer: Self-pay | Admitting: *Deleted

## 2013-06-23 DIAGNOSIS — R079 Chest pain, unspecified: Secondary | ICD-10-CM

## 2013-06-23 DIAGNOSIS — Z01812 Encounter for preprocedural laboratory examination: Secondary | ICD-10-CM

## 2013-06-23 LAB — CBC WITH DIFFERENTIAL/PLATELET
BASOS ABS: 0 10*3/uL (ref 0.0–0.1)
Basophil %: 0.3 %
Eosinophil #: 0.1 10*3/uL (ref 0.0–0.7)
Eosinophil %: 1 %
HCT: 42 % (ref 40.0–52.0)
HGB: 14.2 g/dL (ref 13.0–18.0)
LYMPHS ABS: 2.3 10*3/uL (ref 1.0–3.6)
LYMPHS PCT: 33 %
MCH: 32.5 pg (ref 26.0–34.0)
MCHC: 33.9 g/dL (ref 32.0–36.0)
MCV: 96 fL (ref 80–100)
MONOS PCT: 5 %
Monocyte #: 0.4 x10 3/mm (ref 0.2–1.0)
Neutrophil #: 4.3 10*3/uL (ref 1.4–6.5)
Neutrophil %: 60.7 %
PLATELETS: 198 10*3/uL (ref 150–440)
RBC: 4.38 10*6/uL — ABNORMAL LOW (ref 4.40–5.90)
RDW: 13.5 % (ref 11.5–14.5)
WBC: 7 10*3/uL (ref 3.8–10.6)

## 2013-06-23 LAB — BASIC METABOLIC PANEL
Anion Gap: 4 — ABNORMAL LOW (ref 7–16)
BUN: 15 mg/dL (ref 7–18)
CHLORIDE: 107 mmol/L (ref 98–107)
Calcium, Total: 8.8 mg/dL (ref 8.5–10.1)
Co2: 27 mmol/L (ref 21–32)
Creatinine: 1.12 mg/dL (ref 0.60–1.30)
EGFR (African American): >60
EGFR (Non-African Amer.): >60
GLUCOSE: 92 mg/dL (ref 65–99)
Osmolality: 276 (ref 275–301)
POTASSIUM: 4.2 mmol/L (ref 3.5–5.1)
SODIUM: 138 mmol/L (ref 136–145)

## 2013-06-23 LAB — PROTIME-INR
INR: 0.9
PROTHROMBIN TIME: 12.4 s (ref 11.5–14.7)

## 2013-06-23 NOTE — Telephone Encounter (Signed)
Spoke with patient to inform him that his cath will be 06/28/13 at 1:30 PM. Patient verbalized understanding.

## 2013-06-28 ENCOUNTER — Ambulatory Visit: Payer: Self-pay | Admitting: Cardiovascular Disease

## 2013-06-28 DIAGNOSIS — I2510 Atherosclerotic heart disease of native coronary artery without angina pectoris: Secondary | ICD-10-CM

## 2013-07-26 ENCOUNTER — Encounter: Payer: Self-pay | Admitting: *Deleted

## 2013-07-27 ENCOUNTER — Ambulatory Visit (INDEPENDENT_AMBULATORY_CARE_PROVIDER_SITE_OTHER): Payer: Commercial Managed Care - HMO | Admitting: Cardiovascular Disease

## 2013-07-27 ENCOUNTER — Encounter: Payer: Medicare PPO | Admitting: Cardiovascular Disease

## 2013-07-27 ENCOUNTER — Encounter: Payer: Self-pay | Admitting: Cardiovascular Disease

## 2013-07-27 VITALS — BP 118/79 | HR 57 | Ht 69.5 in | Wt 193.3 lb

## 2013-07-27 DIAGNOSIS — I2510 Atherosclerotic heart disease of native coronary artery without angina pectoris: Secondary | ICD-10-CM

## 2013-07-27 DIAGNOSIS — R001 Bradycardia, unspecified: Secondary | ICD-10-CM

## 2013-07-27 DIAGNOSIS — I10 Essential (primary) hypertension: Secondary | ICD-10-CM | POA: Diagnosis not present

## 2013-07-27 DIAGNOSIS — I498 Other specified cardiac arrhythmias: Secondary | ICD-10-CM | POA: Diagnosis not present

## 2013-07-27 DIAGNOSIS — E785 Hyperlipidemia, unspecified: Secondary | ICD-10-CM | POA: Diagnosis not present

## 2013-07-27 NOTE — Assessment & Plan Note (Signed)
Blood pressure is under excellent control on current medications. Recent ejection fraction was 50% by catheterization.

## 2013-07-27 NOTE — Patient Instructions (Signed)
Continue same medications.   Your physician wants you to follow-up in: 6 months.  You will receive a reminder letter in the mail two months in advance. If you don't receive a letter, please call our office to schedule the follow-up appointment.  

## 2013-07-27 NOTE — Assessment & Plan Note (Signed)
Recent cardiac catheterization showed patent grafts. I recommend continuing medical therapy.

## 2013-07-27 NOTE — Assessment & Plan Note (Signed)
Continue treatment with Crestor with target LDL of less than 70. 

## 2013-07-27 NOTE — Progress Notes (Signed)
HPI  [name] is a 63 year old gentleman who is here today for a followup visit. He has known history of coronary artery disease. He is status post coronary artery bypass graft surgery.in 1993 and redo in 2007.   He suffers from fibromyalgia and chronic pain.  He also has chronic systolic heart failure with mildly reduced LV systolic function. He had GI symptoms last year due to documented gastric ulcer. This improved with treatment. He was seen recently for recurrent episodes of substernal chest pain both at rest and with activities.  He underwent a nuclear stress test which was abnormal and showed mild anterolateral ischemia with normal ejection fraction.  I proceeded with cardiac catheterization via the left radial artery which showed significant native three-vessel coronary artery disease with patent grafts. Diagonal branches are known to be occluded and not bypassed. There was 60% stenosis in the proximal left circumflex supplying OM 3 (OM 2 is bypassed). Pressure wire interrogation showed an FFR ratio of 0.85. No revascularization was performed. He is overall feeling better. He had swelling in his right hand over the weekend which improved with conservative treatment. The catheterization was via the left and not right radial artery.    Allergies  Allergen Reactions  . [employer]   . Fish Oil      Current Outpatient Prescriptions on File Prior to Visit  Medication Sig Dispense Refill  . acetaminophen (TYLENOL) 325 MG tablet Take 650 mg by mouth every 6 (six) hours as needed.      [name] aspirin EC 81 MG tablet Take 81 mg by mouth daily.      . citalopram (CELEXA) 20 MG tablet Takes 2 tablets daily.      . enalapril (VASOTEC) 20 MG tablet Take 20 mg by mouth daily.       . fexofenadine (ALLEGRA) 180 MG tablet Take 180 mg by mouth daily.      . furosemide (LASIX) 40 MG tablet Take 1 tablet (40 mg total) by mouth daily as needed.  30 tablet  3  . gabapentin (NEURONTIN) 800 MG tablet  Take 800 mg by mouth 3 (three) times daily.      . Methylcellulose, Laxative, (SOLUBLE FIBER PO) Take by mouth daily.      . metoprolol succinate (TOPROL-XL) 25 MG 24 hr tablet Take 25 mg by mouth daily.      . nitroGLYCERIN (NITROSTAT) 0.4 MG SL tablet Place 0.4 mg under the tongue every 5 (five) minutes as needed.        . nortriptyline (PAMELOR) 25 MG capsule Take 25 mg by mouth daily.      . pantoprazole (PROTONIX) 40 MG tablet Take 40 mg by mouth daily.        . rosuvastatin (CRESTOR) 20 MG tablet Take 20 mg by mouth daily.        . SUCRALFATE PO Take by mouth 4 (four) times daily.      . vitamin B-12 (CYANOCOBALAMIN) 1000 MCG tablet Take 1,000 mcg by mouth daily.      . potassium chloride SA (K-DUR,KLOR-CON) 20 MEQ tablet Take 1 tablet (20 mEq total) by mouth daily.  30 tablet  6   No current facility-administered medications on file prior to visit.     Past Medical History  Diagnosis Date  . Chronic chest pain   . Stroke   . Sleep apnea   . Depression   . Bowel obstruction 02/2009    small bowel  . Pulmonary embolism  01/2006    Post-op/treated  . Lightheadedness   . SOB (shortness of breath)   . Syncope and collapse   . Coronary artery disease 1993    MI   . Fibromyalgia   . Hypertension   . Hyperlipidemia   . MI (myocardial infarction)   . OSA (obstructive sleep apnea)   . Kidney stones   . Barrett's esophagus   . Diverticulitis   . Urticaria   . Skin lesions, generalized   . Mass on back      Past Surgical History  Procedure Laterality Date  . Bowel obstruction  01/2009  . Shoulder surgery  2010  . Neck surgery  06/2009  . Cardiac catheterization  11/2006    patent grafts. Significant OM3 disease and occluded diagonals.  . Cardiac catheterization  06/2010    patent grafts. significant ISR in proximal LCX but OM2 is bypassed and gives retrograde flow to OM3.   . Coronary artery bypass graft  1993/01/2006    redo at [facility]. LIMA to LAD, SVG to OM2 and SVG to  RPDA  . Back surgery    . Cardiac catheterization  06/2013    [facility]     Family History  Problem Relation Age of Onset  . Heart disease Father 23  . Heart attack Father      History   Social History  . Marital Status: Married    Spouse Name: N/A    Number of Children: N/A  . Years of Education: N/A   Occupational History  . Not on file.   Social History Main Topics  . Smoking status: Former Smoker -- 2.50 packs/day    Types: Cigarettes    Quit date: 08/21/1991  . Smokeless tobacco: Not on file  . Alcohol Use: No  . Drug Use: No  . Sexual Activity: Not on file   Other Topics Concern  . Not on file   Social History Narrative  . No narrative on file      PHYSICAL EXAM   BP 118/79  Pulse 57  Ht 5' 9.5" (1.765 m)  Wt 193 lb 5 oz (87.686 kg)  BMI 28.15 kg/m2  Constitutional: He is oriented to person, place, and time. He appears well-developed and well-nourished. No distress.  HENT: No nasal discharge.  Head: Normocephalic and atraumatic.  Eyes: Pupils are equal and round. Right eye exhibits no discharge. Left eye exhibits no discharge.  Neck: Normal range of motion. Neck supple. No JVD present. No thyromegaly present.  Cardiovascular: Normal rate, regular rhythm, normal heart sounds and. Exam reveals no gallop and no friction rub. No murmur heard.  Pulmonary/Chest: Effort normal and breath sounds normal. No stridor. No respiratory distress. He has no wheezes. He has no rales. He exhibits no tenderness.  Abdominal: Soft. Bowel sounds are normal. He exhibits no distension. There is no tenderness. There is no rebound and no guarding.  Musculoskeletal: Normal range of motion. He exhibits trace edema and no tenderness.  Neurological: He is alert and oriented to person, place, and time. Coordination normal.  Skin: Skin is warm and dry. No rash noted. He is not diaphoretic. No erythema. No pallor.  Psychiatric: He has a normal mood and affect. His behavior is normal.  Judgment and thought content normal.  Left radial pulse is normal.  Right hand is swollen,  tender with limited mobility .    EKG: Sinus  Bradycardia  -Prominent R(V1) and right axis -consider right ventricular hypertrophy  -consider pulmonary disease.   -  Nonspecific T-abnormality.   ABNORMAL    ASSESSMENT AND PLAN

## 2013-11-26 ENCOUNTER — Other Ambulatory Visit: Payer: Self-pay | Admitting: Gastroenterology

## 2013-11-26 LAB — CLOSTRIDIUM DIFFICILE(ARMC)

## 2013-11-28 LAB — STOOL CULTURE

## 2013-12-10 ENCOUNTER — Ambulatory Visit (INDEPENDENT_AMBULATORY_CARE_PROVIDER_SITE_OTHER): Payer: Commercial Managed Care - HMO | Admitting: Podiatry

## 2013-12-10 ENCOUNTER — Encounter: Payer: Self-pay | Admitting: Podiatry

## 2013-12-10 VITALS — BP 144/89 | HR 66 | Resp 17

## 2013-12-10 DIAGNOSIS — B351 Tinea unguium: Secondary | ICD-10-CM

## 2013-12-10 DIAGNOSIS — L03039 Cellulitis of unspecified toe: Secondary | ICD-10-CM

## 2013-12-10 NOTE — Patient Instructions (Addendum)
Betadine Soak Instructions  Purchase an 8 oz. bottle of BETADINE solution (Povidone)  THE DAY AFTER THE PROCEDURE  Place 1 tablespoon of betadine solution in a quart of warm tap water.  Submerge your foot or feet with outer bandage intact for the initial soak; this will allow the bandage to become moist and wet for easy lift off.  Once you remove your bandage, continue to soak in the solution for 20 minutes.  This soak should be done twice a day.  Next, remove your foot or feet from solution, blot dry the affected area and cover.  You may use a band aid large enough to cover the area or use gauze and tape.  Apply other medications to the area as directed by the doctor such as cortisporin otic solution (ear drops) or neosporin.  IF YOUR SKIN BECOMES IRRITATED WHILE USING THESE INSTRUCTIONS, IT IS OKAY TO SWITCH TO EPSOM SALTS AND WATER OR WHITE VINEGAR AND WATER.  Monitor for any signs/symptoms of infection. Call the office immediately if any occur or go directly to the emergency room. Call with any questions/concerns.

## 2013-12-10 NOTE — Progress Notes (Signed)
Subjective:    Patient ID: Willie Clark, male    DOB: 08-Oct-1950, 63 y.o.   MRN: [id number]  HPI  Patient is in today with complaints of left big toe nail pain. He states it has been ongoing for the last several weeks he has noted some drainage including some purulence underneath the nail. He also states that the nail has started to lift from the toe it is painful. He has been soaking the foot and peroxide without any resolution of symptoms. He does take the previously he has had vascular studies performed due to pain in his legs as well as a cold sensation in his feet. He was told his vascular status was intact. No other concerns this time.    Review of Systems  Respiratory: Positive for shortness of breath.        Difficulty breathing  Cardiovascular: Positive for leg swelling.       Chest tightness and calf pain with walking  Gastrointestinal: Positive for abdominal pain, diarrhea and blood in stool.       Bloating  Genitourinary: Positive for urgency.  Musculoskeletal: Positive for back pain and gait problem.       Joint and muscle pain  Skin:       Change in nails  Allergic/Immunologic: Positive for food allergies.  Neurological: Positive for dizziness, weakness and headaches.  All other systems reviewed and are negative.      Objective:   Physical Exam  Nursing note and vitals reviewed. Constitutional: He is oriented to person, place, and time. He appears well-developed and well-nourished.  Musculoskeletal: Normal range of motion. He exhibits no edema.  Tenderness to palpation of the left hallux toenail.  Neurological: He is alert and oriented to person, place, and time.  Decrease protective and vibratory sensation bilaterally.  Skin:  Hallux nail with lifting of the nail plate from the nailbed distally with discoloration of the nail. There is ingrowing of the proximal aspect of the nail. There is evidence of the small amount of purulence from underneath the nailbed.  There is no erythema or ascending cellulitis. No open lesions.   DP/PT pulses palpable b/l. CRT < 3 sec to digits.  No calf pain        Assessment & Plan:  63 year old male left hallux paronychia, lifting of nail -Both conservative and surgical intervention was discussed the patient in great detail including alternatives, risks, complications of both. -Previous vascular studies from February 2015 reviewed. On the left is ABIs 1.3 on the right 1.2 TBI right 0.81 left 1 but no significant arterial disease, normal ABIs, normal hallux toe pressure. -At this time due to localized infection on the nail discussed total nail avulsion. Discussed the patient chance of nonhealing/loss of toe is possible. Patient chance risks and wishes to proceed. Additional skin preparation a total of 1.5 cc of a 50-50 mixture 1% lidocaine plain and 0.5% Marcaine plain was infiltrated in a hallux block fashion. The skin was then prepped in normal sterile fashion. Next the left hallux nail was then fully removed and noted to have significant ingrowing about the medial and lateral borders. Area was then flushed and a dressing was applied. No tourniquet he was used for the suture. Patient tolerated the procedure well without complications. -Post procedure instructions discussed the patient in detail. -Monitor for any clinical signs or symptoms of infection and to call the office immediately or go to the emergency room if any are to occur -Followup in 1 week  or sooner if symptoms are to arise -Followup with primary care physician for other problems as mentioned and review of systems. Patient states these issues are not new, and have been chronic.

## 2013-12-13 ENCOUNTER — Ambulatory Visit: Payer: Self-pay | Admitting: Gastroenterology

## 2013-12-17 LAB — PATHOLOGY REPORT

## 2013-12-23 ENCOUNTER — Encounter: Payer: Self-pay | Admitting: Podiatry

## 2013-12-23 ENCOUNTER — Ambulatory Visit (INDEPENDENT_AMBULATORY_CARE_PROVIDER_SITE_OTHER): Payer: Commercial Managed Care - HMO | Admitting: Podiatry

## 2013-12-23 VITALS — BP 122/72 | HR 60 | Temp 98.4°F | Resp 15 | Ht 69.0 in | Wt 191.8 lb

## 2013-12-23 DIAGNOSIS — M79675 Pain in left toe(s): Secondary | ICD-10-CM

## 2013-12-23 DIAGNOSIS — M79609 Pain in unspecified limb: Secondary | ICD-10-CM

## 2013-12-23 DIAGNOSIS — L03039 Cellulitis of unspecified toe: Secondary | ICD-10-CM

## 2013-12-23 MED ORDER — CEPHALEXIN 500 MG PO CAPS: 500.0000 mg | ORAL_CAPSULE | Freq: Three times a day (TID) | ORAL | Status: DC

## 2013-12-23 NOTE — Patient Instructions (Signed)
Can switch to epsom salt soaks twice a day followed by antibiotic ointment/band-aid  Monitor for any signs/symptoms of infection. Call the office immediately if any occur or go directly to the emergency room. Call with any questions/concerns.   Call immediately if there are any changes or concerns.

## 2013-12-24 ENCOUNTER — Encounter: Payer: Self-pay | Admitting: Podiatry

## 2013-12-24 NOTE — Progress Notes (Signed)
Patient ID: Willie Clark, male   DOB: Apr 23, 1950, 63 y.o.   MRN: [id number]  Subjective: 63 year old male returns the office for follow up evaluation s/p left hallux nail avulsion secondary to infection. He states that over the last couple of days he has had pain to the nail site and mild swelling. He was concerned when last night he had a small black area form over the nail bed. He completed his course of antibiotics. Denies any fevers, chills, nausea, vomiting. No other complaints at this time.   Objective: AAO x3, NAD DP/PT pulses palpable, CRT immediate to all digits Decreased protective sensation with [name] monofilament, decreased vibratory sensation. Left hallux nail bed with central area of granulation tissue was remaining the nailbed healed appropriately. No eschar formation. There is no surrounding erythema, no purulence. There is a small amount of clear drainage from under the proximal nail bed. Upon compression there is no purulence identified. There is no ascending cellulitis. No warmth to the toe/foot. Slight edema.  No leg pain/swelling/warmth.   Assessment: 63 year old male status post left hallux nail avulsion secondary to infection.  Plan: -Various treatment options discussed including alternatives, risks, complications. -At this time the nailbed appears to be healing. There is a small amount of granulation tissue still within the nailbed.  -Switch to Epson salt soaks followed by antibiotic ointment and a Band-Aid daily. Will continue this until healed. -Due to the increase in pain recently and mild edema, will restart antibiotics in case of infection. Keflex prescribed. -Discussed to closely monitor for any changes or any signs/symptoms of infection and directed to call the office immediately if any occur or go directly to the emergency room. -If symptoms continue, will x-ray next appointment -F/U in 1 week or sooner if any problems arise. Call with any  questions/concerns.

## 2013-12-30 ENCOUNTER — Ambulatory Visit (INDEPENDENT_AMBULATORY_CARE_PROVIDER_SITE_OTHER): Payer: Commercial Managed Care - HMO | Admitting: Podiatry

## 2013-12-30 VITALS — BP 120/75 | HR 65 | Resp 16

## 2013-12-30 DIAGNOSIS — L03039 Cellulitis of unspecified toe: Secondary | ICD-10-CM

## 2013-12-30 NOTE — Patient Instructions (Signed)
Continue epsom salt soaks until healed. Antibiotic ointment and band-aid during the day and can leave uncovered at night.  Monitor for any signs/symptoms of infection. Call the office immediately if any occur or go directly to the emergency room. Call with any questions/concerns.

## 2014-01-01 ENCOUNTER — Encounter: Payer: Self-pay | Admitting: Podiatry

## 2014-01-01 NOTE — Progress Notes (Signed)
Patient ID: BOLIVAR KORANDA, male   DOB: 03/07/1951, 63 y.o.   MRN: [id number]  Subjective: Mr. Sadler returns the office they for followup evaluation status post left hallux nail avulsion secondary to infection. He states that since starting the Epsom salts soaks he has had decreased pain and swelling. He states that he is doing much better. Finishes course of antibiotics today. Denies any redness around the nail site, drainage. Denies any systemic complaints such as fever, chills, nausea, vomiting.  Objective: AAO x3, NAD DP/PT pulses palpable 2/4 b/l. CRT < 3sec Protective sensation decreased with Simms Weinstein monofilament. Left hallux nail bed with a small central area of red granulation tissue with remaining nail bed healing appropriately. No surrounding erythema, purulence, ascending cellulitis. Note drainage. No edema/warmth.  No leg pain, swelling, warmth.  Assessment: 63 year old male status post left hallux nail avulsion secondary to infection, healing well.  Plan: -Treatment including alternatives, risks, complications were discussed. -Continue with Epsom salts soaks twice a day until completely healed. However with antibiotic ointment and a Band-Aid daily but can leave uncovered at night.  -Monitor for any signs or symptoms of infection and directed to call the office immediately or go directly to the emergency room if any are to occur. -Followup in 3 weeks to ensure complete healing. In the meantime call with any questions or concerns or any change in symptoms.

## 2014-01-31 ENCOUNTER — Ambulatory Visit: Payer: Commercial Managed Care - HMO | Admitting: Cardiovascular Disease

## 2014-02-08 ENCOUNTER — Encounter: Payer: Self-pay | Admitting: Cardiovascular Disease

## 2014-02-08 ENCOUNTER — Ambulatory Visit (INDEPENDENT_AMBULATORY_CARE_PROVIDER_SITE_OTHER): Payer: Medicare PPO | Admitting: Cardiovascular Disease

## 2014-02-08 VITALS — BP 120/78 | HR 58 | Ht 69.0 in | Wt 195.5 lb

## 2014-02-08 DIAGNOSIS — R001 Bradycardia, unspecified: Secondary | ICD-10-CM

## 2014-02-08 DIAGNOSIS — R079 Chest pain, unspecified: Secondary | ICD-10-CM

## 2014-02-08 DIAGNOSIS — I25118 Atherosclerotic heart disease of native coronary artery with other forms of angina pectoris: Secondary | ICD-10-CM

## 2014-02-08 DIAGNOSIS — I10 Essential (primary) hypertension: Secondary | ICD-10-CM

## 2014-02-08 DIAGNOSIS — E785 Hyperlipidemia, unspecified: Secondary | ICD-10-CM

## 2014-02-08 NOTE — Progress Notes (Signed)
HPI  [name] is a 63 year old gentleman who is here today for a followup visit. He has known history of coronary artery disease. He is status post coronary artery bypass graft surgery.in 1993 and redo in 2007.   He suffers from fibromyalgia and chronic pain.  He also has chronic systolic heart failure with mildly reduced LV systolic function. He had GI symptoms last year due to documented gastric ulcer. This improved with treatment. He was seen early this year for recurrent episodes of substernal chest pain both at rest and with activities.  He underwent a nuclear stress test which was abnormal and showed mild anterolateral ischemia with normal ejection fraction.  I proceeded with cardiac catheterization in March of 2015 which showed significant native three-vessel coronary artery disease with patent grafts. Diagonal branches are known to be occluded and not bypassed. There was 60% stenosis in the proximal left circumflex supplying OM 3 (OM 2 is bypassed). Pressure wire interrogation showed an FFR ratio of 0.85. No revascularization was performed.  He has chronic chest pain with use of nitroglycerin. This has not worsened recently.   Allergies  Allergen Reactions  . [employer]   . Fish Oil   . Other     Other reaction(s): Unknown Onions, bacon     Current Outpatient Prescriptions on File Prior to Visit  Medication Sig Dispense Refill  . acetaminophen (TYLENOL) 500 MG tablet Take 500 mg by mouth.      [name] aspirin EC 81 MG tablet Take 81 mg by mouth daily.      . citalopram (CELEXA) 20 MG tablet Takes 2 tablets daily.      . enalapril (VASOTEC) 20 MG tablet Take 20 mg by mouth daily.       . fexofenadine (ALLEGRA) 180 MG tablet Take 180 mg by mouth daily.      . furosemide (LASIX) 40 MG tablet Take 1 tablet (40 mg total) by mouth daily as needed.  30 tablet  3  . gabapentin (NEURONTIN) 800 MG tablet Take 800 mg by mouth 3 (three) times daily.      . Methylcellulose, Laxative,  (SOLUBLE FIBER PO) Take by mouth daily.      . metoprolol succinate (TOPROL-XL) 25 MG 24 hr tablet Take 25 mg by mouth daily.      . nitroGLYCERIN (NITROSTAT) 0.4 MG SL tablet Place 0.4 mg under the tongue every 5 (five) minutes as needed.        . pantoprazole (PROTONIX) 40 MG tablet Take 40 mg by mouth daily.        . polyethylene glycol powder (GLYCOLAX/MIRALAX) powder       . potassium chloride SA (K-DUR,KLOR-CON) 20 MEQ tablet Take 1 tablet (20 mEq total) by mouth daily.  30 tablet  6  . rosuvastatin (CRESTOR) 20 MG tablet Take 20 mg by mouth daily.        . sucralfate (CARAFATE) 1 G tablet Take by mouth.      . SUCRALFATE PO Take by mouth 4 (four) times daily.      [name] topiramate (TOPAMAX) 50 MG tablet Take 50 mg by mouth daily.       . vitamin B-12 (CYANOCOBALAMIN) 1000 MCG tablet Take 1,000 mcg by mouth daily.      . Wheat Dextrin (BENEFIBER) POWD Take by mouth.       No current facility-administered medications on file prior to visit.     Past Medical History  Diagnosis Date  . Chronic  chest pain   . Stroke   . Sleep apnea   . Depression   . Bowel obstruction 02/2009    small bowel  . Pulmonary embolism 01/2006    Post-op/treated  . Lightheadedness   . SOB (shortness of breath)   . Syncope and collapse   . Coronary artery disease 1993    MI   . Fibromyalgia   . Hypertension   . Hyperlipidemia   . MI (myocardial infarction)   . OSA (obstructive sleep apnea)   . Kidney stones   . Barrett's esophagus   . Diverticulitis   . Urticaria   . Skin lesions, generalized   . Mass on back      Past Surgical History  Procedure Laterality Date  . Bowel obstruction  01/2009  . Shoulder surgery  2010  . Neck surgery  06/2009  . Coronary artery bypass graft  1993/01/2006    redo at [facility]. LIMA to LAD, SVG to OM2 and SVG to RPDA  . Back surgery    . Cardiac catheterization  11/2006    patent grafts. Significant OM3 disease and occluded diagonals.  . Cardiac catheterization   06/2010    patent grafts. significant ISR in proximal LCX but OM2 is bypassed and gives retrograde flow to OM3.   . Cardiac catheterization  06/2013    [facility]: Patent grafts with 60% proximal in-stent restenosis in the left circumflex with FFR of 0.85     Family History  Problem Relation Age of Onset  . Heart disease Father 61  . Heart attack Father      History   Social History  . Marital Status: Married    Spouse Name: N/A    Number of Children: N/A  . Years of Education: N/A   Occupational History  . Not on file.   Social History Main Topics  . Smoking status: Former Smoker -- 2.50 packs/day    Types: Cigarettes    Quit date: 08/21/1991  . Smokeless tobacco: Not on file  . Alcohol Use: No  . Drug Use: No  . Sexual Activity: Not on file   Other Topics Concern  . Not on file   Social History Narrative  . No narrative on file      PHYSICAL EXAM   BP 120/78  Pulse 58  Ht 5\' 9"  (1.753 m)  Wt 195 lb 8 oz (88.678 kg)  BMI 28.86 kg/m2  Constitutional: He is oriented to person, place, and time. He appears well-developed and well-nourished. No distress.  HENT: No nasal discharge.  Head: Normocephalic and atraumatic.  Eyes: Pupils are equal and round. Right eye exhibits no discharge. Left eye exhibits no discharge.  Neck: Normal range of motion. Neck supple. No JVD present. No thyromegaly present.  Cardiovascular: Normal rate, regular rhythm, normal heart sounds and. Exam reveals no gallop and no friction rub. No murmur heard.  Pulmonary/Chest: Effort normal and breath sounds normal. No stridor. No respiratory distress. He has no wheezes. He has no rales. He exhibits no tenderness.  Abdominal: Soft. Bowel sounds are normal. He exhibits no distension. There is no tenderness. There is no rebound and no guarding.  Musculoskeletal: Normal range of motion. He exhibits trace edema and no tenderness.  Neurological: He is alert and oriented to person, place, and time.  Coordination normal.  Skin: Skin is warm and dry. No rash noted. He is not diaphoretic. No erythema. No pallor.  Psychiatric: He has a normal mood and affect. His behavior is  normal. Judgment and thought content normal.     EKG: Sinus   Bradycardia  -  Nonspecific T-abnormality.   ABNORMAL    ASSESSMENT AND PLAN

## 2014-02-08 NOTE — Assessment & Plan Note (Signed)
Blood pressure is well controlled on current medications. 

## 2014-02-08 NOTE — Patient Instructions (Signed)
Continue same medications.   Your physician wants you to follow-up in: 6 months.  You will receive a reminder letter in the mail two months in advance. If you don't receive a letter, please call our office to schedule the follow-up appointment.  Your next appointment will be scheduled in our new office located at :  [facility]  [address], [address]  [place], [place] [address]  

## 2014-02-08 NOTE — Assessment & Plan Note (Signed)
He is overall stable from a cardiac standpoint with no worsening chest pain. Continue medical therapy.

## 2014-02-08 NOTE — Assessment & Plan Note (Signed)
This has been stable and asymptomatic on small dose Toprol.

## 2014-02-08 NOTE — Assessment & Plan Note (Signed)
Continue treatment with rosuvastatin with a target LDL of less than 70. 

## 2014-07-26 ENCOUNTER — Encounter
Admit: 2014-07-26 | Disposition: A | Discharge status: Home / Self Care | Payer: Self-pay | Attending: Neurology | Admitting: Neurology

## 2014-08-01 ENCOUNTER — Ambulatory Visit
Admit: 2014-08-01 | Disposition: A | Discharge status: Home / Self Care | Payer: Self-pay | Attending: Neurology | Admitting: Neurology

## 2014-08-01 ENCOUNTER — Emergency Department
Admit: 2014-08-01 | Disposition: A | Discharge status: Home / Self Care | Payer: Self-pay | Admitting: Emergency Medicine

## 2014-08-01 LAB — BASIC METABOLIC PANEL
Anion Gap: 6 — ABNORMAL LOW (ref 7–16)
BUN: 10 mg/dL
CHLORIDE: 108 mmol/L
Calcium, Total: 8.8 mg/dL — ABNORMAL LOW
Co2: 25 mmol/L
Creatinine: 1.05 mg/dL
EGFR (African American): >60
GLUCOSE: 143 mg/dL — AB
Potassium: 3.4 mmol/L — ABNORMAL LOW
Sodium: 139 mmol/L

## 2014-08-01 LAB — CBC WITH DIFFERENTIAL/PLATELET
BASOS PCT: 0.2 %
Basophil #: 0 10*3/uL (ref 0.0–0.1)
EOS ABS: 0.1 10*3/uL (ref 0.0–0.7)
EOS PCT: 0.9 %
HCT: 42 % (ref 40.0–52.0)
HGB: 13.9 g/dL (ref 13.0–18.0)
LYMPHS PCT: 28.7 %
Lymphocyte #: 2.2 10*3/uL (ref 1.0–3.6)
MCH: 32.1 pg (ref 26.0–34.0)
MCHC: 33.2 g/dL (ref 32.0–36.0)
MCV: 97 fL (ref 80–100)
MONOS PCT: 5.3 %
Monocyte #: 0.4 x10 3/mm (ref 0.2–1.0)
NEUTROS PCT: 64.9 %
Neutrophil #: 5 10*3/uL (ref 1.4–6.5)
Platelet: 166 10*3/uL (ref 150–440)
RBC: 4.34 10*6/uL — AB (ref 4.40–5.90)
RDW: 13.6 % (ref 11.5–14.5)
WBC: 7.7 10*3/uL (ref 3.8–10.6)

## 2014-08-01 LAB — TROPONIN I: Troponin-I: <0.03 ng/mL

## 2014-08-08 ENCOUNTER — Ambulatory Visit (INDEPENDENT_AMBULATORY_CARE_PROVIDER_SITE_OTHER): Payer: Medicare HMO | Admitting: Cardiovascular Disease

## 2014-08-08 ENCOUNTER — Encounter: Payer: Self-pay | Admitting: Cardiovascular Disease

## 2014-08-08 VITALS — BP 138/87 | HR 57 | Ht 69.5 in | Wt 196.5 lb

## 2014-08-08 DIAGNOSIS — G459 Transient cerebral ischemic attack, unspecified: Secondary | ICD-10-CM | POA: Diagnosis not present

## 2014-08-08 DIAGNOSIS — E785 Hyperlipidemia, unspecified: Secondary | ICD-10-CM

## 2014-08-08 DIAGNOSIS — I25118 Atherosclerotic heart disease of native coronary artery with other forms of angina pectoris: Secondary | ICD-10-CM

## 2014-08-08 DIAGNOSIS — G458 Other transient cerebral ischemic attacks and related syndromes: Secondary | ICD-10-CM

## 2014-08-08 DIAGNOSIS — I10 Essential (primary) hypertension: Secondary | ICD-10-CM

## 2014-08-08 DIAGNOSIS — R42 Dizziness and giddiness: Secondary | ICD-10-CM

## 2014-08-08 NOTE — Progress Notes (Signed)
HPI  Willie Clark is a 64 year old gentleman who is here today for a followup visit. He has known history of coronary artery disease. He is status post coronary artery bypass graft surgery.in 1993 and redo in 2007.   He suffers from fibromyalgia and chronic pain.  He also has chronic systolic heart failure with mildly reduced LV systolic function. He had GI symptoms last year due to documented gastric ulcer. This improved with treatment. He was seen early 2015 for recurrent episodes of substernal chest pain both at rest and with activities.  He underwent a nuclear stress test which was abnormal and showed mild anterolateral ischemia with normal ejection fraction.  I proceeded with cardiac catheterization in March of 2015 which showed significant native three-vessel coronary artery disease with patent grafts. Diagonal branches are known to be occluded and not bypassed. There was 60% stenosis in the proximal left circumflex supplying OM 3 (OM 2 is bypassed). Pressure wire interrogation showed an FFR ratio of 0.85. No revascularization was performed.  He has been stable from a cardiac standpoint overall. He has chronic chest pain with no recent worsening. He reports having slurred speech and left arm numbness in February with suspected TIA. He saw Dr. Manuella Ghazi.    Allergies  Allergen Reactions  . [employer]   . Fish Oil   . Other     Other reaction(s): Unknown Onions, bacon     Current Outpatient Prescriptions on File Prior to Visit  Medication Sig Dispense Refill  . acetaminophen (TYLENOL) 500 MG tablet Take 500 mg by mouth.    [name] aspirin EC 81 MG tablet Take 81 mg by mouth daily.    . citalopram (CELEXA) 20 MG tablet Takes 1 tablets daily.    . enalapril (VASOTEC) 20 MG tablet Take 20 mg by mouth daily.     . fexofenadine (ALLEGRA) 180 MG tablet Take 180 mg by mouth daily.    . furosemide (LASIX) 40 MG tablet Take 1 tablet (40 mg total) by mouth daily as needed. 30 tablet 3  . gabapentin  (NEURONTIN) 800 MG tablet Take 800 mg by mouth 3 (three) times daily.    . Methylcellulose, Laxative, (SOLUBLE FIBER PO) Take by mouth daily.    . metoprolol succinate (TOPROL-XL) 25 MG 24 hr tablet Take 25 mg by mouth daily.    . nitroGLYCERIN (NITROSTAT) 0.4 MG SL tablet Place 0.4 mg under the tongue every 5 (five) minutes as needed.      . pantoprazole (PROTONIX) 40 MG tablet Take 40 mg by mouth 2 (two) times daily.     . potassium chloride SA (K-DUR,KLOR-CON) 20 MEQ tablet Take 1 tablet (20 mEq total) by mouth daily. 30 tablet 6  . rosuvastatin (CRESTOR) 20 MG tablet Take 20 mg by mouth daily.      . sucralfate (CARAFATE) 1 G tablet Take by mouth 4 (four) times daily.     . SUCRALFATE PO Take by mouth 4 (four) times daily.    [name] topiramate (TOPAMAX) 50 MG tablet Take 50 mg by mouth 2 (two) times daily.     . vitamin B-12 (CYANOCOBALAMIN) 1000 MCG tablet Take 1,000 mcg by mouth daily.     No current facility-administered medications on file prior to visit.     Past Medical History  Diagnosis Date  . Chronic chest pain   . Stroke   . Sleep apnea   . Depression   . Bowel obstruction 02/2009    small bowel  .  Pulmonary embolism 01/2006    Post-op/treated  . Lightheadedness   . SOB (shortness of breath)   . Syncope and collapse   . Coronary artery disease 1993    MI   . Fibromyalgia   . Hypertension   . Hyperlipidemia   . MI (myocardial infarction)   . OSA (obstructive sleep apnea)   . Kidney stones   . Barrett's esophagus   . Diverticulitis   . Urticaria   . Skin lesions, generalized   . Mass on back      Past Surgical History  Procedure Laterality Date  . Bowel obstruction  01/2009  . Shoulder surgery  2010  . Neck surgery  06/2009  . Coronary artery bypass graft  1993/01/2006    redo at [facility]. LIMA to LAD, SVG to OM2 and SVG to RPDA  . Back surgery    . Cardiac catheterization  11/2006    patent grafts. Significant OM3 disease and occluded diagonals.  . Cardiac  catheterization  06/2010    patent grafts. significant ISR in proximal LCX but OM2 is bypassed and gives retrograde flow to OM3.   . Cardiac catheterization  06/2013    [facility]: Patent grafts with 60% proximal in-stent restenosis in the left circumflex with FFR of 0.85     Family History  Problem Relation Age of Onset  . Heart disease Father 66  . Heart attack Father      History   Social History  . Marital Status: Married    Spouse Name: N/A  . Number of Children: N/A  . Years of Education: N/A   Occupational History  . Not on file.   Social History Main Topics  . Smoking status: Former Smoker -- 2.50 packs/day    Types: Cigarettes    Quit date: 08/21/1991  . Smokeless tobacco: Not on file  . Alcohol Use: No  . Drug Use: No  . Sexual Activity: Not on file   Other Topics Concern  . Not on file   Social History Narrative      PHYSICAL EXAM   BP 138/87 mmHg  Pulse 57  Ht 5' 9.5" (1.765 m)  Wt 196 lb 8 oz (89.132 kg)  BMI 28.61 kg/m2  Constitutional: He is oriented to person, place, and time. He appears well-developed and well-nourished. No distress.  HENT: No nasal discharge.  Head: Normocephalic and atraumatic.  Eyes: Pupils are equal and round. Right eye exhibits no discharge. Left eye exhibits no discharge.  Neck: Normal range of motion. Neck supple. No JVD present. No thyromegaly present.  Cardiovascular: Normal rate, regular rhythm, normal heart sounds and. Exam reveals no gallop and no friction rub. No murmur heard.  Pulmonary/Chest: Effort normal and breath sounds normal. No stridor. No respiratory distress. He has no wheezes. He has no rales. He exhibits no tenderness.  Abdominal: Soft. Bowel sounds are normal. He exhibits no distension. There is no tenderness. There is no rebound and no guarding.  Musculoskeletal: Normal range of motion. He exhibits trace edema and no tenderness.  Neurological: He is alert and oriented to person, place, and time.  Coordination normal.  Skin: Skin is warm and dry. No rash noted. He is not diaphoretic. No erythema. No pallor.  Psychiatric: He has a normal mood and affect. His behavior is normal. Judgment and thought content normal.     EKG: Sinus   Bradycardia  -  Nonspecific T-abnormality.   ABNORMAL    ASSESSMENT AND PLAN

## 2014-08-08 NOTE — Patient Instructions (Signed)
Medication Instructions:  None  Labwork: None  Testing/Procedures: Your physician has requested that you have a carotid duplex. This test is an ultrasound of the carotid arteries in your neck. It looks at blood flow through these arteries that supply the brain with blood. Allow one hour for this exam. There are no restrictions or special instructions.    Follow-Up: Your physician wants you to follow-up in: 6 months with Dr. Fletcher Anon. You will receive a reminder letter in the mail two months in advance. If you don't receive a letter, please call our office to schedule the follow-up appointment.   Any Other Special Instructions Will Be Listed Below (If Applicable).

## 2014-08-12 ENCOUNTER — Encounter (INDEPENDENT_AMBULATORY_CARE_PROVIDER_SITE_OTHER): Payer: Medicare HMO

## 2014-08-12 DIAGNOSIS — R42 Dizziness and giddiness: Secondary | ICD-10-CM | POA: Diagnosis not present

## 2014-08-12 DIAGNOSIS — I6523 Occlusion and stenosis of bilateral carotid arteries: Secondary | ICD-10-CM

## 2014-08-12 DIAGNOSIS — R4702 Dysphasia: Secondary | ICD-10-CM

## 2014-08-12 DIAGNOSIS — G459 Transient cerebral ischemic attack, unspecified: Secondary | ICD-10-CM

## 2014-08-12 DIAGNOSIS — R270 Ataxia, unspecified: Secondary | ICD-10-CM | POA: Diagnosis not present

## 2014-08-13 DIAGNOSIS — G459 Transient cerebral ischemic attack, unspecified: Secondary | ICD-10-CM | POA: Insufficient documentation

## 2014-08-13 HISTORY — DX: Transient cerebral ischemic attack, unspecified: G45.9

## 2014-08-13 NOTE — Assessment & Plan Note (Signed)
Continue treatment with rosuvastatin with a target LDL of less than 70. 

## 2014-08-13 NOTE — Assessment & Plan Note (Signed)
Possible TIA in February. I requested carotid Doppler given his known history of atherosclerosis.

## 2014-08-13 NOTE — Assessment & Plan Note (Signed)
He has stable anginal symptoms with no recent worsening. I recommend continuing medical therapy.

## 2014-08-13 NOTE — Assessment & Plan Note (Signed)
Blood pressure is well controlled on current medications. 

## 2014-10-12 ENCOUNTER — Other Ambulatory Visit: Payer: Self-pay | Admitting: Gastroenterology

## 2014-10-12 DIAGNOSIS — R109 Unspecified abdominal pain: Secondary | ICD-10-CM

## 2014-10-18 ENCOUNTER — Ambulatory Visit
Admission: RE | Admit: 2014-10-18 | Discharge: 2014-10-18 | Disposition: A | Discharge status: Home / Self Care | Payer: Medicare HMO | Source: Ambulatory Visit | Attending: Gastroenterology | Admitting: Gastroenterology

## 2014-10-18 DIAGNOSIS — N200 Calculus of kidney: Secondary | ICD-10-CM | POA: Insufficient documentation

## 2014-10-18 DIAGNOSIS — R140 Abdominal distension (gaseous): Secondary | ICD-10-CM | POA: Diagnosis present

## 2014-10-18 DIAGNOSIS — I708 Atherosclerosis of other arteries: Secondary | ICD-10-CM | POA: Diagnosis not present

## 2014-10-18 DIAGNOSIS — R109 Unspecified abdominal pain: Secondary | ICD-10-CM

## 2014-10-18 DIAGNOSIS — R1013 Epigastric pain: Secondary | ICD-10-CM | POA: Diagnosis present

## 2014-10-18 DIAGNOSIS — K4090 Unilateral inguinal hernia, without obstruction or gangrene, not specified as recurrent: Secondary | ICD-10-CM | POA: Diagnosis not present

## 2014-10-18 DIAGNOSIS — K5730 Diverticulosis of large intestine without perforation or abscess without bleeding: Secondary | ICD-10-CM | POA: Diagnosis not present

## 2014-10-18 DIAGNOSIS — I700 Atherosclerosis of aorta: Secondary | ICD-10-CM | POA: Insufficient documentation

## 2014-10-18 MED ORDER — IOHEXOL 350 MG/ML SOLN
100.0000 mL | Freq: Once | INTRAVENOUS | Status: AC | PRN
  Administered 2014-10-18: 100 mL via INTRAVENOUS

## 2014-10-25 ENCOUNTER — Encounter: Payer: Self-pay | Admitting: *Deleted

## 2014-10-25 ENCOUNTER — Ambulatory Visit: Payer: Medicare HMO | Admitting: Anesthesiology

## 2014-10-25 ENCOUNTER — Encounter
Admission: RE | Disposition: A | Discharge status: Home / Self Care | Payer: Self-pay | Source: Ambulatory Visit | Attending: Gastroenterology

## 2014-10-25 ENCOUNTER — Ambulatory Visit
Admission: RE | Admit: 2014-10-25 | Discharge: 2014-10-25 | Disposition: A | Discharge status: Home / Self Care | Payer: Medicare HMO | Source: Ambulatory Visit | Attending: Gastroenterology | Admitting: Gastroenterology

## 2014-10-25 DIAGNOSIS — I10 Essential (primary) hypertension: Secondary | ICD-10-CM | POA: Diagnosis not present

## 2014-10-25 DIAGNOSIS — G4733 Obstructive sleep apnea (adult) (pediatric): Secondary | ICD-10-CM | POA: Diagnosis not present

## 2014-10-25 DIAGNOSIS — Z86711 Personal history of pulmonary embolism: Secondary | ICD-10-CM | POA: Insufficient documentation

## 2014-10-25 DIAGNOSIS — M797 Fibromyalgia: Secondary | ICD-10-CM | POA: Insufficient documentation

## 2014-10-25 DIAGNOSIS — F329 Major depressive disorder, single episode, unspecified: Secondary | ICD-10-CM | POA: Diagnosis not present

## 2014-10-25 DIAGNOSIS — K5792 Diverticulitis of intestine, part unspecified, without perforation or abscess without bleeding: Secondary | ICD-10-CM | POA: Diagnosis not present

## 2014-10-25 DIAGNOSIS — Z87891 Personal history of nicotine dependence: Secondary | ICD-10-CM | POA: Diagnosis not present

## 2014-10-25 DIAGNOSIS — Z87442 Personal history of urinary calculi: Secondary | ICD-10-CM | POA: Diagnosis not present

## 2014-10-25 DIAGNOSIS — I252 Old myocardial infarction: Secondary | ICD-10-CM | POA: Diagnosis not present

## 2014-10-25 DIAGNOSIS — K219 Gastro-esophageal reflux disease without esophagitis: Secondary | ICD-10-CM | POA: Insufficient documentation

## 2014-10-25 DIAGNOSIS — K2270 Barrett's esophagus without dysplasia: Secondary | ICD-10-CM | POA: Diagnosis not present

## 2014-10-25 DIAGNOSIS — E785 Hyperlipidemia, unspecified: Secondary | ICD-10-CM | POA: Diagnosis not present

## 2014-10-25 DIAGNOSIS — Z888 Allergy status to other drugs, medicaments and biological substances status: Secondary | ICD-10-CM | POA: Insufficient documentation

## 2014-10-25 DIAGNOSIS — K319 Disease of stomach and duodenum, unspecified: Secondary | ICD-10-CM | POA: Diagnosis not present

## 2014-10-25 DIAGNOSIS — G709 Myoneural disorder, unspecified: Secondary | ICD-10-CM | POA: Diagnosis not present

## 2014-10-25 DIAGNOSIS — G4730 Sleep apnea, unspecified: Secondary | ICD-10-CM | POA: Insufficient documentation

## 2014-10-25 DIAGNOSIS — Z7982 Long term (current) use of aspirin: Secondary | ICD-10-CM | POA: Diagnosis not present

## 2014-10-25 DIAGNOSIS — K2950 Unspecified chronic gastritis without bleeding: Secondary | ICD-10-CM | POA: Insufficient documentation

## 2014-10-25 DIAGNOSIS — Z91018 Allergy to other foods: Secondary | ICD-10-CM | POA: Diagnosis not present

## 2014-10-25 DIAGNOSIS — I2510 Atherosclerotic heart disease of native coronary artery without angina pectoris: Secondary | ICD-10-CM | POA: Diagnosis not present

## 2014-10-25 DIAGNOSIS — I739 Peripheral vascular disease, unspecified: Secondary | ICD-10-CM | POA: Diagnosis not present

## 2014-10-25 DIAGNOSIS — Z79899 Other long term (current) drug therapy: Secondary | ICD-10-CM | POA: Diagnosis not present

## 2014-10-25 DIAGNOSIS — R1013 Epigastric pain: Secondary | ICD-10-CM | POA: Diagnosis present

## 2014-10-25 DIAGNOSIS — R1084 Generalized abdominal pain: Secondary | ICD-10-CM | POA: Diagnosis present

## 2014-10-25 HISTORY — PX: ESOPHAGOGASTRODUODENOSCOPY: SHX5428

## 2014-10-25 SURGERY — EGD (ESOPHAGOGASTRODUODENOSCOPY)
Anesthesia: General

## 2014-10-25 MED ORDER — FENTANYL CITRATE (PF) 100 MCG/2ML IJ SOLN
INTRAMUSCULAR | Status: DC | PRN
  Administered 2014-10-25: 50 ug via INTRAVENOUS

## 2014-10-25 MED ORDER — EPHEDRINE SULFATE 50 MG/ML IJ SOLN
INTRAMUSCULAR | Status: DC | PRN
  Administered 2014-10-25: 10 mg via INTRAVENOUS

## 2014-10-25 MED ORDER — PHENYLEPHRINE HCL 10 MG/ML IJ SOLN
INTRAMUSCULAR | Status: DC | PRN
  Administered 2014-10-25 (×2): 50 ug via INTRAVENOUS

## 2014-10-25 MED ORDER — SODIUM CHLORIDE 0.9 % IV SOLN
INTRAVENOUS | Status: DC
  Administered 2014-10-25: 07:00:00 via INTRAVENOUS

## 2014-10-25 MED ORDER — GLYCOPYRROLATE 0.2 MG/ML IJ SOLN
INTRAMUSCULAR | Status: DC | PRN
  Administered 2014-10-25: 0.1 mg via INTRAVENOUS

## 2014-10-25 MED ORDER — PROPOFOL INFUSION 10 MG/ML OPTIME
INTRAVENOUS | Status: DC | PRN
  Administered 2014-10-25: 120 ug/kg/min via INTRAVENOUS

## 2014-10-25 MED ORDER — MIDAZOLAM HCL 2 MG/2ML IJ SOLN
INTRAMUSCULAR | Status: DC | PRN
  Administered 2014-10-25: 1 mg via INTRAVENOUS

## 2014-10-25 MED ORDER — SODIUM CHLORIDE 0.9 % IV SOLN
INTRAVENOUS | Status: DC
  Administered 2014-10-25: 08:00:00 via INTRAVENOUS

## 2014-10-25 NOTE — Procedures (Addendum)
DRE done before procedure showing esternal hemorrhoid with stigmata of recent bleeding.  Will rx analpram hc 2.5%cream tid times 10 d

## 2014-10-25 NOTE — Transfer of Care (Signed)
Immediate Anesthesia Transfer of Care Note  Patient: Willie Clark  Procedure(s) Performed: Procedure(s): ESOPHAGOGASTRODUODENOSCOPY (EGD) (N/A)  Patient Location: PACU  Anesthesia Type:General  Level of Consciousness: awake and sedated  Airway & Oxygen Therapy: Patient Spontanous Breathing and Patient connected to nasal cannula oxygen  Post-op Assessment: Report given to RN and Post -op Vital signs reviewed and stable  Post vital signs: Reviewed and stable  Last Vitals:  Filed Vitals:   10/25/14 0708  BP: 134/84  Pulse: 95  Temp: 35.9 C  Resp: 18    Complications: No apparent anesthesia complications

## 2014-10-25 NOTE — Anesthesia Procedure Notes (Signed)
Performed by: COOK-MARTIN, Dereonna Lensing Pre-anesthesia Checklist: Patient identified, Emergency Drugs available, Suction available, Patient being monitored and Timeout performed Patient Re-evaluated:Patient Re-evaluated prior to inductionOxygen Delivery Method: Nasal cannula Preoxygenation: Pre-oxygenation with 100% oxygen Intubation Type: IV induction Airway Equipment and Method: Bite block Placement Confirmation: positive ETCO2 and CO2 detector     

## 2014-10-25 NOTE — Anesthesia Postprocedure Evaluation (Signed)
  Anesthesia Post-op Note  Patient: Willie Clark  Procedure(s) Performed: Procedure(s): ESOPHAGOGASTRODUODENOSCOPY (EGD) (N/A)  Anesthesia type:General  Patient location: PACU  Post pain: Pain level controlled  Post assessment: Post-op Vital signs reviewed, Patient's Cardiovascular Status Stable, Respiratory Function Stable, Patent Airway and No signs of Nausea or vomiting  Post vital signs: Reviewed and stable  Last Vitals:  Filed Vitals:   10/25/14 0930  BP: 89/53  Pulse:   Temp:   Resp:     Level of consciousness: awake, alert  and patient cooperative  Complications: No apparent anesthesia complications

## 2014-10-25 NOTE — Op Note (Signed)
[facility] Patient Name: Willie Clark Procedure Date: 10/25/2014 7:35 AM MRN: [id number] Account #: [id number] Date of Birth: [birth year] Admit Type: Outpatient Age: 64 Room: [facility] ENDO ROOM 3 Gender: Male Note Status: Finalized Procedure:         Upper GI endoscopy Indications:       Epigastric abdominal pain, Generalized abdominal pain,                     Gastro-esophageal reflux disease Providers:         Lollie Sails, MD Referring MD:      Ellin Goodie, MD (Referring MD) Medicines:         Monitored Anesthesia Care Complications:     No immediate complications. Procedure:         Pre-Anesthesia Assessment:                    - ASA Grade Assessment: III - A patient with severe                     systemic disease.                    After obtaining informed consent, the endoscope was passed                     under direct vision. Throughout the procedure, the                     patient's blood pressure, pulse, and oxygen saturations                     were monitored continuously. The Olympus GIF-160 endoscope                     (S#. [id number]) was introduced through the mouth, and                     advanced to the fourth part of duodenum. The upper GI                     endoscopy was accomplished without difficulty. The patient                     tolerated the procedure well. Findings:      The Z-line was variable. Biopsies were taken with a cold forceps for       histology at 38 cm in a quadrant fashion, an a biopsy at 39 cm of a       possible barretts tongue. Patietn with previous h/o barretts.      The exam of the esophagus was otherwise normal.      Localized moderate inflammation characterized by congestion (edema) and       erythema was found at the incisura. Biopsies were taken with a cold       forceps for histology.      Patchy minimal inflammation characterized by erythema was found in the       gastric antrum. Biopsies  were taken with a cold forceps for histology.       Biopsies were taken with a cold forceps for Helicobacter pylori testing.      The cardia and gastric fundus were normal on retroflexion.      Diffuse mucosal flattening was found in the entire examined duodenum.  Biopsies were taken with a cold forceps for histology. Impression:        - Z-line variable. Biopsied.                    - Gastritis. Biopsied.                    - Erosive gastritis. Biopsied.                    - Flattened mucosa was found in the duodenum, not                     consistent with celiac disease. Biopsied. Recommendation:    - Await pathology results.                    - Continue present medications. Procedure Code(s): --- Professional ---                    [phone], Esophagogastroduodenoscopy, flexible, transoral;                     with biopsy, single or multiple Diagnosis Code(s): --- Professional ---                    530.89, Other specified disorders of esophagus                    535.50, Unspecified gastritis and gastroduodenitis,                     without mention of hemorrhage                    535.40, Other specified gastritis, without mention of                     hemorrhage                    537.9, Unspecified disorder of stomach and duodenum                    789.06, Abdominal pain, epigastric                    789.07, Abdominal pain, generalized                    530.81, Esophageal reflux CPT copyright 2014 American Medical Association. All rights reserved. The codes documented in this report are preliminary and upon coder review may  be revised to meet current compliance requirements. Lollie Sails, MD 10/25/2014 9:05:43 AM This report has been signed electronically. Number of Addenda: 0 Note Initiated On: 10/25/2014 7:35 AM      [facility]

## 2014-10-25 NOTE — Anesthesia Preprocedure Evaluation (Addendum)
Anesthesia Evaluation  Patient identified by MRN, date of birth, ID band Patient awake    Reviewed: Allergy & Precautions, NPO status , Patient's Chart, lab work & pertinent test results, reviewed documented beta blocker date and time   Airway Mallampati: II  TM Distance: >3 FB Neck ROM: Full    Dental  (+) Chipped   Pulmonary shortness of breath and with exertion, sleep apnea , former smoker,  breath sounds clear to auscultation  Pulmonary exam normal       Cardiovascular hypertension, + CAD, + Past MI and + Peripheral Vascular Disease Normal cardiovascular exam+ pacemaker     Neuro/Psych Depression TIA Neuromuscular disease CVA    GI/Hepatic Diverticulitis,..The patient has a history of obstruction   Endo/Other  negative endocrine ROS  Renal/GU Kidney stone hx  negative genitourinary   Musculoskeletal  (+) Fibromyalgia -  Abdominal Normal abdominal exam  (+)   Peds negative pediatric ROS (+)  Hematology negative hematology ROS (+)   Anesthesia Other Findings   Reproductive/Obstetrics                           Anesthesia Physical Anesthesia Plan  ASA: IV  Anesthesia Plan: General   Post-op Pain Management:    Induction: Intravenous  Airway Management Planned: Nasal Cannula  Additional Equipment:   Intra-op Plan:   Post-operative Plan:   Informed Consent: I have reviewed the patients History and Physical, chart, labs and discussed the procedure including the risks, benefits and alternatives for the proposed anesthesia with the patient or authorized representative who has indicated his/her understanding and acceptance.   Dental advisory given  Plan Discussed with: CRNA and Surgeon  Anesthesia Plan Comments:         Anesthesia Quick Evaluation

## 2014-10-25 NOTE — H&P (Signed)
Outpatient short stay form Pre-procedure 10/25/2014 8:30 AM Willie Sails MD  Primary Physician: Dr. Ellin Goodie  Reason for visit:  EGD  History of present illness:  Abdominal pain, GERD, history of severe gastric ulcer. She presenting with early satiety and abdominal bloating in the setting of a known history of gastric ulcer. I did obtain a viral CT with mesenteric angiogram this showed some narrowing of the inferior mesenteric artery.    Current facility-administered medications:  .  0.9 %  sodium chloride infusion, , Intravenous, Continuous, Willie Sails, MD, Last Rate: 50 mL/hr at 10/25/14 0725 .  0.9 %  sodium chloride infusion, , Intravenous, Continuous, Willie Sails, MD  Prescriptions prior to admission  Medication Sig Dispense Refill Last Dose  . aspirin EC 81 MG tablet Take 81 mg by mouth daily.   Past Week at Unknown time  . enalapril (VASOTEC) 20 MG tablet Take 20 mg by mouth daily.    10/25/2014 at 0600  . metoprolol succinate (TOPROL-XL) 25 MG 24 hr tablet Take 25 mg by mouth daily.   10/25/2014 at 0600  . acetaminophen (TYLENOL) 500 MG tablet Take 500 mg by mouth.   Taking  . citalopram (CELEXA) 20 MG tablet Takes 1 tablets daily.   Taking  . fexofenadine (ALLEGRA) 180 MG tablet Take 180 mg by mouth daily.   Taking  . FIBER PO Take by mouth daily.   Taking  . furosemide (LASIX) 40 MG tablet Take 1 tablet (40 mg total) by mouth daily as needed. 30 tablet 3 Taking  . gabapentin (NEURONTIN) 800 MG tablet Take 800 mg by mouth 3 (three) times daily.   Taking  . Methylcellulose, Laxative, (SOLUBLE FIBER PO) Take by mouth daily.   Taking  . Multiple Vitamins-Minerals (HAIR/SKIN/NAILS PO) Take by mouth 3 (three) times daily.   Taking  . nitroGLYCERIN (NITROSTAT) 0.4 MG SL tablet Place 0.4 mg under the tongue every 5 (five) minutes as needed.     Taking  . nortriptyline (PAMELOR) 25 MG capsule daily.   Taking  . pantoprazole (PROTONIX) 40 MG tablet Take 40 mg by mouth  2 (two) times daily.    Taking  . potassium chloride SA (K-DUR,KLOR-CON) 20 MEQ tablet Take 1 tablet (20 mEq total) by mouth daily. 30 tablet 6 Taking  . rosuvastatin (CRESTOR) 20 MG tablet Take 20 mg by mouth daily.     Taking  . Simethicone (GAS-X PO) Take by mouth as needed.   Taking  . sucralfate (CARAFATE) 1 G tablet Take by mouth 4 (four) times daily.    Taking  . SUCRALFATE PO Take by mouth 4 (four) times daily.   Taking  . tamsulosin (FLOMAX) 0.4 MG CAPS capsule Take 0.4 mg by mouth daily.   Taking  . topiramate (TOPAMAX) 50 MG tablet Take 50 mg by mouth 2 (two) times daily.    Taking  . vitamin B-12 (CYANOCOBALAMIN) 1000 MCG tablet Take 1,000 mcg by mouth daily.   Taking     Allergies  Allergen Reactions  . [employer]   . Fish Oil   . Lidocaine     Nausea and vomiting. TKV  . Other     Other reaction(s): Unknown Onions, bacon     Past Medical History  Diagnosis Date  . Chronic chest pain   . Stroke   . Sleep apnea   . Depression   . Bowel obstruction 02/2009    small bowel  . Pulmonary embolism 01/2006  Post-op/treated  . Lightheadedness   . SOB (shortness of breath)   . Syncope and collapse   . Coronary artery disease 1993    MI   . Fibromyalgia   . Hypertension   . Hyperlipidemia   . MI (myocardial infarction)   . OSA (obstructive sleep apnea)   . Kidney stones   . Barrett's esophagus   . Diverticulitis   . Urticaria   . Skin lesions, generalized   . Mass on back     Review of systems:      Physical Exam    Heart and lungs: Regular rate and rhythm without rub or gallop, bilaterally clear   HEENT: Normocephalic atraumatic eyes are anicteric    Other:     Pertinant exam for procedure: Soft mild diffuse discomfort palpation mostly in the low epigastric region. No masses rebound or organomegaly. Sounds positive normoactive.    Planned proceedures: EGD and indicated procedures I have discussed the risks benefits and complications of  procedures to include not limited to bleeding, infection, perforation and the risk of sedation and the patient wishes to proceed.    Willie Sails, MD Gastroenterology 10/25/2014  8:30 AM

## 2014-10-26 ENCOUNTER — Encounter: Payer: Self-pay | Admitting: Gastroenterology

## 2014-10-26 LAB — SURGICAL PATHOLOGY

## 2014-10-31 NOTE — Addendum Note (Signed)
Addendum  created 10/31/14 1135 by Alvin Critchley, MD   Modules edited: Clinical Notes   Clinical Notes:  File: [id number]

## 2014-12-19 ENCOUNTER — Telehealth: Payer: Self-pay

## 2014-12-19 NOTE — Telephone Encounter (Signed)
S/w pt regarding neck surgery.  Pt states neck surgery with Dr. Vertell Limber not yet scheduled.   Awaiting cardiac clearance. Informed pt I would contact MD office.

## 2014-12-19 NOTE — Telephone Encounter (Signed)
Pt needs surgical clearance for neck surgery, made an appt for 9/20, but asks if he needs to be seen. Please call.

## 2014-12-19 NOTE — Telephone Encounter (Signed)
S/w Janett Billow at [facility] regarding upcoming pt surgery. Janett Billow states pt to have C3-4 anterior cervical fusion when cleared by cardiology  [phone] fax Forward to Christell Faith, PA-C

## 2014-12-19 NOTE — Telephone Encounter (Signed)
He would be acceptable risk for non-cardiac surgery. Continue aspirin if able.

## 2014-12-20 NOTE — Telephone Encounter (Signed)
Clearance letter faxed to [facility] at [facility] and [place] [phone]

## 2014-12-27 ENCOUNTER — Ambulatory Visit: Payer: Medicare HMO | Attending: Otolaryngology

## 2014-12-27 DIAGNOSIS — G4733 Obstructive sleep apnea (adult) (pediatric): Secondary | ICD-10-CM | POA: Insufficient documentation

## 2015-01-10 ENCOUNTER — Encounter: Payer: Self-pay | Admitting: Cardiovascular Disease

## 2015-01-10 ENCOUNTER — Ambulatory Visit (INDEPENDENT_AMBULATORY_CARE_PROVIDER_SITE_OTHER): Payer: Medicare HMO | Admitting: Cardiovascular Disease

## 2015-01-10 VITALS — BP 110/70 | HR 69 | Ht 69.5 in | Wt 195.8 lb

## 2015-01-10 DIAGNOSIS — I10 Essential (primary) hypertension: Secondary | ICD-10-CM

## 2015-01-10 DIAGNOSIS — R600 Localized edema: Secondary | ICD-10-CM | POA: Diagnosis not present

## 2015-01-10 DIAGNOSIS — I25118 Atherosclerotic heart disease of native coronary artery with other forms of angina pectoris: Secondary | ICD-10-CM | POA: Diagnosis not present

## 2015-01-10 DIAGNOSIS — R0602 Shortness of breath: Secondary | ICD-10-CM | POA: Diagnosis not present

## 2015-01-10 DIAGNOSIS — R079 Chest pain, unspecified: Secondary | ICD-10-CM

## 2015-01-10 DIAGNOSIS — E785 Hyperlipidemia, unspecified: Secondary | ICD-10-CM

## 2015-01-10 NOTE — Progress Notes (Signed)
HPI  Willie Clark is a 64 year old gentleman who is here today for a followup visit. He has known history of coronary artery disease. He is status post coronary artery bypass graft surgery in 1993 and redo in 2007.   He suffers from fibromyalgia and chronic pain.  He also has chronic systolic heart failure with mildly reduced LV systolic function. He had GI symptoms in 2015 due to documented gastric ulcer. This improved with treatment. He was seen early 2015 for recurrent episodes of substernal chest pain both at rest and with activities.  He underwent a nuclear stress test which was abnormal and showed mild anterolateral ischemia with normal ejection fraction.  I proceeded with cardiac catheterization in March of 2015 which showed significant native three-vessel coronary artery disease with patent grafts. Diagonal branches are known to be occluded and not bypassed. There was 60% stenosis in the proximal left circumflex supplying OM 3 (OM 2 is bypassed). Pressure wire interrogation showed an FFR ratio of 0.85. No revascularization was performed.  He is going to require cervical spine surgery in the near future. He has been deemed to be at low risk from cardiovascular complications. He reports no change in chest pain or shortness of breath. He did notice worsening leg edema over the last 2 weeks. He has been taking Lasix on a regular basis once daily instead of as needed. He denies use of NSAIDs or excessive salt intake.    Allergies  Allergen Reactions  . [employer]   . Fish Oil   . Lidocaine     Nausea and vomiting. TKV  . Other     Other reaction(s): Unknown Onions, bacon     Current Outpatient Prescriptions on File Prior to Visit  Medication Sig Dispense Refill  . acetaminophen (TYLENOL) 500 MG tablet Take 500 mg by mouth.    [name] aspirin EC 81 MG tablet Take 81 mg by mouth daily.    . citalopram (CELEXA) 20 MG tablet Takes 1 tablets daily.    . enalapril (VASOTEC) 20 MG tablet Take  20 mg by mouth daily.     . fexofenadine (ALLEGRA) 180 MG tablet Take 180 mg by mouth daily.    [name] FIBER PO Take by mouth daily.    . furosemide (LASIX) 40 MG tablet Take 1 tablet (40 mg total) by mouth daily as needed. 30 tablet 3  . gabapentin (NEURONTIN) 800 MG tablet Take 800 mg by mouth 3 (three) times daily.    . Methylcellulose, Laxative, (SOLUBLE FIBER PO) Take by mouth daily.    . metoprolol succinate (TOPROL-XL) 25 MG 24 hr tablet Take 25 mg by mouth daily.    . Multiple Vitamins-Minerals (HAIR/SKIN/NAILS PO) Take by mouth 3 (three) times daily.    . nitroGLYCERIN (NITROSTAT) 0.4 MG SL tablet Place 0.4 mg under the tongue every 5 (five) minutes as needed.      . nortriptyline (PAMELOR) 25 MG capsule daily.    . pantoprazole (PROTONIX) 40 MG tablet Take 40 mg by mouth 2 (two) times daily.     . potassium chloride SA (K-DUR,KLOR-CON) 20 MEQ tablet Take 1 tablet (20 mEq total) by mouth daily. 30 tablet 6  . rosuvastatin (CRESTOR) 20 MG tablet Take 20 mg by mouth daily.      . Simethicone (GAS-X PO) Take by mouth as needed.    . sucralfate (CARAFATE) 1 G tablet Take by mouth 4 (four) times daily.     . SUCRALFATE PO Take by  mouth 4 (four) times daily.    . tamsulosin (FLOMAX) 0.4 MG CAPS capsule Take 0.4 mg by mouth daily.    [name] topiramate (TOPAMAX) 50 MG tablet Take 50 mg by mouth 2 (two) times daily.     . vitamin B-12 (CYANOCOBALAMIN) 1000 MCG tablet Take 1,000 mcg by mouth daily.     No current facility-administered medications on file prior to visit.     Past Medical History  Diagnosis Date  . Chronic chest pain   . Stroke   . Sleep apnea   . Depression   . Bowel obstruction 02/2009    small bowel  . Pulmonary embolism 01/2006    Post-op/treated  . Lightheadedness   . SOB (shortness of breath)   . Syncope and collapse   . Coronary artery disease 1993    MI   . Fibromyalgia   . Hypertension   . Hyperlipidemia   . MI (myocardial infarction)   . OSA (obstructive sleep  apnea)   . Kidney stones   . Barrett's esophagus   . Diverticulitis   . Urticaria   . Skin lesions, generalized   . Mass on back      Past Surgical History  Procedure Laterality Date  . Bowel obstruction  01/2009  . Shoulder surgery  2010  . Neck surgery  06/2009  . Coronary artery bypass graft  1993/01/2006    redo at [facility]. LIMA to LAD, SVG to OM2 and SVG to RPDA  . Back surgery    . Cardiac catheterization  11/2006    patent grafts. Significant OM3 disease and occluded diagonals.  . Cardiac catheterization  06/2010    patent grafts. significant ISR in proximal LCX but OM2 is bypassed and gives retrograde flow to OM3.   . Cardiac catheterization  06/2013    [facility]: Patent grafts with 60% proximal in-stent restenosis in the left circumflex with FFR of 0.85  . Cholecystectomy    . Esophagogastroduodenoscopy N/A 10/25/2014    Procedure: ESOPHAGOGASTRODUODENOSCOPY (EGD);  Surgeon: Lollie Sails, MD;  Location: [facility] ENDOSCOPY;  Service: Endoscopy;  Laterality: N/A;     Family History  Problem Relation Age of Onset  . Heart disease Father 91  . Heart attack Father      Social History   Social History  . Marital Status: Married    Spouse Name: N/A  . Number of Children: N/A  . Years of Education: N/A   Occupational History  . Not on file.   Social History Main Topics  . Smoking status: Former Smoker -- 2.50 packs/day    Types: Cigarettes    Quit date: 08/21/1991  . Smokeless tobacco: Not on file  . Alcohol Use: No  . Drug Use: No  . Sexual Activity: Not on file   Other Topics Concern  . Not on file   Social History Narrative      PHYSICAL EXAM   BP 110/70 mmHg  Pulse 69  Ht 5' 9.5" (1.765 m)  Wt 195 lb 12 oz (88.792 kg)  BMI 28.50 kg/m2  Constitutional: He is oriented to person, place, and time. He appears well-developed and well-nourished. No distress.  HENT: No nasal discharge.  Head: Normocephalic and atraumatic.  Eyes: Pupils are equal and  round. Right eye exhibits no discharge. Left eye exhibits no discharge.  Neck: Normal range of motion. Neck supple. No JVD present. No thyromegaly present.  Cardiovascular: Normal rate, regular rhythm, normal heart sounds and. Exam reveals no gallop and no  friction rub. No murmur heard.  Pulmonary/Chest: Effort normal and breath sounds normal. No stridor. No respiratory distress. He has no wheezes. He has no rales. He exhibits no tenderness.  Abdominal: Soft. Bowel sounds are normal. He exhibits no distension. There is no tenderness. There is no rebound and no guarding.  Musculoskeletal: Normal range of motion. He exhibits +2 edema and no tenderness.  Neurological: He is alert and oriented to person, place, and time. Coordination normal.  Skin: Skin is warm and dry. No rash noted. He is not diaphoretic. No erythema. No pallor.  Psychiatric: He has a normal mood and affect. His behavior is normal. Judgment and thought content normal.     EKG: Normal sinus rhythm with no significant ST or T wave changes.  ASSESSMENT AND PLAN

## 2015-01-10 NOTE — Assessment & Plan Note (Signed)
The patient seems to be stable overall from a cardiac standpoint except for worsening leg edema. Continue medical therapy for his coronary artery disease

## 2015-01-10 NOTE — Assessment & Plan Note (Signed)
Blood pressure is controlled on current medications. 

## 2015-01-10 NOTE — Assessment & Plan Note (Signed)
This is significantly worse than before. The most likely etiology is chronic venous insufficiency. I requested routine labs including basic metabolic profile and BNP. I asked him to elevate his legs at least 3 times daily. I also increased furosemide to 40 mg twice daily for only 3 days and then back to once daily.

## 2015-01-10 NOTE — Assessment & Plan Note (Signed)
Continue treatment with rosuvastatin with a target LDL of less than 70. 

## 2015-01-10 NOTE — Patient Instructions (Addendum)
Medication Instructions:  Your physician has recommended you make the following change in your medication:  INCREASE lasix to 40mg  twice per day for 3 days then decrease back to your normal dosage  Labwork: Your physician recommends that you have labs today: BMET, BNP   Testing/Procedures: none  Follow-Up: Your physician wants you to follow-up in: six months with Dr. Fletcher Anon.  You will receive a reminder letter in the mail two months in advance. If you don't receive a letter, please call our office to schedule the follow-up appointment.   Any Other Special Instructions Will Be Listed Below (If Applicable). Elevate your legs for 20 minutes three times per day.

## 2015-01-12 LAB — BASIC METABOLIC PANEL
BUN / CREAT RATIO: 10 (ref 10–22)
BUN: 13 mg/dL (ref 8–27)
CO2: 21 mmol/L (ref 18–29)
CREATININE: 1.29 mg/dL — AB (ref 0.76–1.27)
Calcium: 9.4 mg/dL (ref 8.6–10.2)
Chloride: 100 mmol/L (ref 97–108)
GFR calc Af Amer: 68 mL/min/{1.73_m2} (ref >59)
GFR calc non Af Amer: 59 mL/min/{1.73_m2} — ABNORMAL LOW (ref >59)
GLUCOSE: 106 mg/dL — AB (ref 65–99)
Potassium: 4 mmol/L (ref 3.5–5.2)
SODIUM: 140 mmol/L (ref 134–144)

## 2015-01-12 LAB — BRAIN NATRIURETIC PEPTIDE: BNP: 23.8 pg/mL (ref 0.0–100.0)

## 2015-03-28 ENCOUNTER — Other Ambulatory Visit: Payer: Self-pay | Admitting: Neurosurgery

## 2015-03-28 DIAGNOSIS — M5416 Radiculopathy, lumbar region: Secondary | ICD-10-CM

## 2015-04-18 ENCOUNTER — Ambulatory Visit
Admission: RE | Admit: 2015-04-18 | Discharge: 2015-04-18 | Disposition: A | Discharge status: Home / Self Care | Payer: Medicare HMO | Source: Ambulatory Visit | Attending: Neurosurgery | Admitting: Neurosurgery

## 2015-04-18 DIAGNOSIS — M7138 Other bursal cyst, other site: Secondary | ICD-10-CM | POA: Diagnosis not present

## 2015-04-18 DIAGNOSIS — M5126 Other intervertebral disc displacement, lumbar region: Secondary | ICD-10-CM | POA: Insufficient documentation

## 2015-04-18 DIAGNOSIS — M4806 Spinal stenosis, lumbar region: Secondary | ICD-10-CM | POA: Diagnosis not present

## 2015-04-18 DIAGNOSIS — M5416 Radiculopathy, lumbar region: Secondary | ICD-10-CM | POA: Diagnosis not present

## 2015-04-18 DIAGNOSIS — Z9889 Other specified postprocedural states: Secondary | ICD-10-CM | POA: Diagnosis not present

## 2015-04-18 MED ORDER — GADOBENATE DIMEGLUMINE 529 MG/ML IV SOLN
20.0000 mL | Freq: Once | INTRAVENOUS | Status: AC | PRN
  Administered 2015-04-18: 18 mL via INTRAVENOUS

## 2015-07-31 ENCOUNTER — Ambulatory Visit: Payer: Medicare HMO | Admitting: Cardiovascular Disease

## 2015-08-14 ENCOUNTER — Ambulatory Visit (INDEPENDENT_AMBULATORY_CARE_PROVIDER_SITE_OTHER): Payer: Medicare HMO | Admitting: Cardiovascular Disease

## 2015-08-14 ENCOUNTER — Encounter (INDEPENDENT_AMBULATORY_CARE_PROVIDER_SITE_OTHER): Payer: Self-pay

## 2015-08-14 ENCOUNTER — Encounter: Payer: Self-pay | Admitting: Cardiovascular Disease

## 2015-08-14 VITALS — BP 108/72 | HR 64 | Ht 69.5 in | Wt 191.0 lb

## 2015-08-14 DIAGNOSIS — I10 Essential (primary) hypertension: Secondary | ICD-10-CM

## 2015-08-14 DIAGNOSIS — R079 Chest pain, unspecified: Secondary | ICD-10-CM

## 2015-08-14 DIAGNOSIS — R001 Bradycardia, unspecified: Secondary | ICD-10-CM | POA: Diagnosis not present

## 2015-08-14 DIAGNOSIS — I25118 Atherosclerotic heart disease of native coronary artery with other forms of angina pectoris: Secondary | ICD-10-CM

## 2015-08-14 NOTE — Patient Instructions (Addendum)
Medication Instructions:  Your physician recommends that you continue on your current medications as directed. Please refer to the Current Medication list given to you today.   Labwork: none  Testing/Procedures: Your physician has requested that you have a lexiscan myoview. For further information please visit [email or web address]. Please follow instruction sheet, as given.  [place]  Your caregiver has ordered a Stress Test with nuclear imaging. The purpose of this test is to evaluate the blood supply to your heart muscle. This procedure is referred to as a "Non-Invasive Stress Test." This is because other than having an IV started in your vein, nothing is inserted or "invades" your body. Cardiac stress tests are done to find areas of poor blood flow to the heart by determining the extent of coronary artery disease (CAD). Some patients exercise on a treadmill, which naturally increases the blood flow to your heart, while others who are  unable to walk on a treadmill due to physical limitations have a pharmacologic/chemical stress agent called Lexiscan . This medicine will mimic walking on a treadmill by temporarily increasing your coronary blood flow.   Please note: these test may take anywhere between 2-4 hours to complete  PLEASE REPORT TO [facility] AT THE FIRST DESK WILL DIRECT YOU WHERE TO GO  Date of Procedure: Monday, May 1  Arrival Time for Procedure: 7:45am  Instructions regarding medication:    __xx__:  Hold metoprolol the morning of procedure    PLEASE NOTIFY THE OFFICE AT LEAST 24 HOURS IN ADVANCE IF YOU ARE UNABLE TO KEEP YOUR APPOINTMENT.  [phone] AND  PLEASE NOTIFY NUCLEAR MEDICINE AT [facility] AT LEAST 24 HOURS IN ADVANCE IF YOU ARE UNABLE TO KEEP YOUR APPOINTMENT. [phone]  How to prepare for your Myoview test:   Do not eat or drink after midnight  No caffeine for 24 hours prior to test  No smoking 24 hours prior to  test.  Your medication may be taken with water.  If your doctor stopped a medication because of this test, do not take that medication.  Ladies, please do not wear dresses.  Skirts or pants are appropriate. Please wear a short sleeve shirt.  No perfume, cologne or lotion.             Follow-Up: Your physician wants you to follow-up in: six months with Dr. Fletcher Anon.  You will receive a reminder letter in the mail two months in advance. If you don't receive a letter, please call our office to schedule the follow-up appointment.   Any Other Special Instructions Will Be Listed Below (If Applicable).     If you need a refill on your cardiac medications before your next appointment, please call your pharmacy.  Cardiac Nuclear Scanning A cardiac nuclear scan is used to check your heart for problems, such as the following:  A portion of the heart is not getting enough blood.  Part of the heart muscle has died, which happens with a heart attack.  The heart wall is not working normally.  In this test, a radioactive dye (tracer) is injected into your bloodstream. After the tracer has traveled to your heart, a scanning device is used to measure how much of the tracer is absorbed by or distributed to various areas of your heart. LET [facility] CARE PROVIDER KNOW ABOUT:  Any allergies you have.  All medicines you are taking, including vitamins, herbs, eye drops, creams, and over-the-counter medicines.  Previous problems you or members of  your family have had with the use of anesthetics.  Any blood disorders you have.  Previous surgeries you have had.  Medical conditions you have.  RISKS AND COMPLICATIONS Generally, this is a safe procedure. However, as with any procedure, problems can occur. Possible problems include:   Serious chest pain.  Rapid heartbeat.  Sensation of warmth in your chest. This usually passes quickly. BEFORE THE PROCEDURE Ask your health care provider  about changing or stopping your regular medicines. PROCEDURE This procedure is usually done at a hospital and takes 2-4 hours.  An IV tube is inserted into one of your veins.  Your health care provider will inject a small amount of radioactive tracer through the tube.  You will then wait for 20-40 minutes while the tracer travels through your bloodstream.  You will lie down on an exam table so images of your heart can be taken. Images will be taken for about 15-20 minutes.  You will exercise on a treadmill or stationary bike. While you exercise, your heart activity will be monitored with an electrocardiogram (ECG), and your blood pressure will be checked.  If you are unable to exercise, you may be given a medicine to make your heart beat faster.  When blood flow to your heart has peaked, tracer will again be injected through the IV tube.  After 20-40 minutes, you will get back on the exam table and have more images taken of your heart.  When the procedure is over, your IV tube will be removed. AFTER THE PROCEDURE  You will likely be able to leave shortly after the test. Unless your health care provider tells you otherwise, you may return to your normal schedule, including diet, activities, and medicines.  Make sure you find out how and when you will get your test results.   This information is not intended to replace advice given to you by your health care provider. Make sure you discuss any questions you have with your health care provider.   Document Released: 05/03/2004 Document Revised: 04/13/2013 Document Reviewed: 03/17/2013 Elsevier Interactive Patient Education [employer].

## 2015-08-14 NOTE — Progress Notes (Signed)
Cardiology Office Note   Date:  08/14/2015   ID:  Willie Clark, DOB 06-Oct-1950, MRN [id number]  PCP:  [name], MD  Cardiologist:   [name], MD   Chief Complaint  Patient presents with  . other    6 month f/u. Meds reviewed verbally with pt.      History of Present Illness: Willie Clark is a 65 y.o. male who presents for  a followup visit. He has known history of coronary artery disease. He is status post coronary artery bypass graft surgery in 1993 and redo in 2007.   He suffers from fibromyalgia and chronic pain.  He also has chronic systolic heart failure with mildly reduced LV systolic function. He had GI symptoms in 2015 due to documented gastric ulcer. This improved with treatment. He was seen early 2015 for recurrent episodes of substernal chest pain both at rest and with activities.  He underwent a nuclear stress test which was abnormal and showed mild anterolateral ischemia with normal ejection fraction.  I proceeded with cardiac catheterization in March of 2015 which showed significant native three-vessel coronary artery disease with patent grafts. Diagonal branches are known to be occluded and not bypassed. There was 60% stenosis in the proximal left circumflex supplying OM 3 (OM 2 is bypassed). Pressure wire interrogation showed an FFR ratio of 0.85. No revascularization was performed.  He had neck surgery last year without complications. He is not reporting worsening chest pain described as pressure feeling both at rest and with physical activities. He reports exercise intolerance with inability to do activities of daily living with extreme fatigue. He also reports symptoms of nausea when he takes a shower.   Past Medical History  Diagnosis Date  . Chronic chest pain   . Stroke ([place])   . Sleep apnea   . Depression   . Bowel obstruction ([place]) 02/2009    small bowel  . Pulmonary embolism ([place]) 01/2006    Post-op/treated  . Lightheadedness   . SOB  (shortness of breath)   . Syncope and collapse   . Coronary artery disease 1993    MI   . Fibromyalgia   . Hypertension   . Hyperlipidemia   . MI (myocardial infarction) ([place])   . OSA (obstructive sleep apnea)   . Kidney stones   . Barrett's esophagus   . Diverticulitis   . Urticaria   . Skin lesions, generalized   . Mass on back     Past Surgical History  Procedure Laterality Date  . Bowel obstruction  01/2009  . Shoulder surgery  2010  . Neck surgery  06/2009  . Coronary artery bypass graft  1993/01/2006    redo at [facility]. LIMA to LAD, SVG to OM2 and SVG to RPDA  . Back surgery    . Cardiac catheterization  11/2006    patent grafts. Significant OM3 disease and occluded diagonals.  . Cardiac catheterization  06/2010    patent grafts. significant ISR in proximal LCX but OM2 is bypassed and gives retrograde flow to OM3.   . Cardiac catheterization  06/2013    [facility]: Patent grafts with 60% proximal in-stent restenosis in the left circumflex with FFR of 0.85  . Cholecystectomy    . Esophagogastroduodenoscopy N/A 10/25/2014    Procedure: ESOPHAGOGASTRODUODENOSCOPY (EGD);  Surgeon: Lollie Sails, MD;  Location: [facility] ENDOSCOPY;  Service: Endoscopy;  Laterality: N/A;     Current Outpatient Prescriptions  Medication Sig Dispense Refill  . acetaminophen (  TYLENOL) 500 MG tablet Take 500 mg by mouth.    [name] aspirin EC 81 MG tablet Take 81 mg by mouth daily.    . citalopram (CELEXA) 20 MG tablet Takes 1 tablets daily.    . enalapril (VASOTEC) 20 MG tablet Take 20 mg by mouth daily.     . fexofenadine (ALLEGRA) 180 MG tablet Take 180 mg by mouth daily.    [name] FIBER PO Take by mouth daily.    . furosemide (LASIX) 40 MG tablet Take 1 tablet (40 mg total) by mouth daily as needed. 30 tablet 3  . gabapentin (NEURONTIN) 800 MG tablet Take 800 mg by mouth 3 (three) times daily.    . Methylcellulose, Laxative, (SOLUBLE FIBER PO) Take by mouth daily.    . metoprolol succinate (TOPROL-XL) 25 MG 24  hr tablet Take 25 mg by mouth daily.    . Multiple Vitamins-Minerals (HAIR/SKIN/NAILS PO) Take by mouth 3 (three) times daily.    . nitroGLYCERIN (NITROSTAT) 0.4 MG SL tablet Place 0.4 mg under the tongue every 5 (five) minutes as needed.      . nortriptyline (PAMELOR) 25 MG capsule daily.    . pantoprazole (PROTONIX) 40 MG tablet Take 40 mg by mouth 2 (two) times daily.     . potassium chloride SA (K-DUR,KLOR-CON) 20 MEQ tablet Take 1 tablet (20 mEq total) by mouth daily. 30 tablet 6  . rosuvastatin (CRESTOR) 20 MG tablet Take 20 mg by mouth daily.      . Simethicone (GAS-X PO) Take by mouth as needed.    . sucralfate (CARAFATE) 1 G tablet Take by mouth 4 (four) times daily.     . SUCRALFATE PO Take by mouth 4 (four) times daily.    . tamsulosin (FLOMAX) 0.4 MG CAPS capsule Take 0.4 mg by mouth daily.    [name] topiramate (TOPAMAX) 50 MG tablet Take 50 mg by mouth 2 (two) times daily.     . vitamin B-12 (CYANOCOBALAMIN) 1000 MCG tablet Take 1,000 mcg by mouth daily.     No current facility-administered medications for this visit.    Allergies:   Andris Flurry; Fish oil; Lidocaine; and Other    Social History:  The patient  reports that he quit smoking about 23 years ago. His smoking use included Cigarettes. He smoked 2.50 packs per day. He does not have any smokeless tobacco history on file. He reports that he drinks alcohol. He reports that he does not use illicit drugs.   Family History:  The patient's family history includes Heart attack in his father; Heart disease (age of onset: 46) in his father.    ROS:  Please see the history of present illness.   Otherwise, review of systems are positive for none.   All other systems are reviewed and negative.    PHYSICAL EXAM: VS:  BP 108/72 mmHg  Pulse 64  Ht 5' 9.5" (1.765 m)  Wt 191 lb (86.637 kg)  BMI 27.81 kg/m2 , BMI Body mass index is 27.81 kg/(m^2). GEN: Well nourished, well developed, in no acute distress HEENT: normal Neck: no JVD,  carotid bruits, or masses Cardiac: RRR; no murmurs, rubs, or gallops,no edema  Respiratory:  clear to auscultation bilaterally, normal work of breathing GI: soft, nontender, nondistended, + BS MS: no deformity or atrophy Skin: warm and dry, no rash Neuro:  Strength and sensation are intact Psych: euthymic mood, full affect   EKG:  EKG is ordered today. The ekg ordered today demonstrates normal sinus  rhythm with no significant ST or T wave changes.   Recent Labs: 01/10/2015: BNP 23.8; BUN 13; Creatinine, Ser 1.29*; Potassium 4.0; Sodium 140    Lipid Panel No results found for: CHOL, TRIG, HDL, CHOLHDL, VLDL, LDLCALC, LDLDIRECT    Wt Readings from Last 3 Encounters:  08/14/15 191 lb (86.637 kg)  01/10/15 195 lb 12 oz (88.792 kg)  10/25/14 198 lb (89.812 kg)         ASSESSMENT AND PLAN:  1.  Coronary artery disease involving native coronary arteries with other forms of angina: The patient has chronic chest pain which has significantly worsened over the last few months with extreme exercise intolerance. His baseline ECG is unremarkable. I requested a pharmacologic nuclear stress test. He is not able to exercise on a treadmill.  2. Essential hypertension: Blood pressure is well controlled on current medications.  3. Chronic venous insufficiency: The patient had worsening edema during last visit which has resolved completely. BNP was normal. He has not required furosemide recently.  4. Hyperlipidemia: Continue current dose of rosuvastatin with a target LDL of less than 70.    Disposition:   FU with me in 6 months  Signed,  [name], MD  08/14/2015 3:49 PM    [place]

## 2015-08-17 ENCOUNTER — Telehealth: Payer: Self-pay | Admitting: Cardiovascular Disease

## 2015-08-17 NOTE — Telephone Encounter (Signed)
S/w pt and reviewed myoview instructions. Pt agreeable w/plan with no further questions.

## 2015-08-21 ENCOUNTER — Encounter
Admission: RE | Admit: 2015-08-21 | Discharge: 2015-08-21 | Disposition: A | Discharge status: Home / Self Care | Payer: Medicare HMO | Source: Ambulatory Visit | Attending: Cardiovascular Disease | Admitting: Cardiovascular Disease

## 2015-08-21 DIAGNOSIS — R079 Chest pain, unspecified: Secondary | ICD-10-CM | POA: Diagnosis not present

## 2015-08-21 DIAGNOSIS — I510 Cardiac septal defect, acquired: Secondary | ICD-10-CM | POA: Insufficient documentation

## 2015-08-21 LAB — NM MYOCAR MULTI W/SPECT W/WALL MOTION / EF
CHL CUP MPHR: 156 {beats}/min
CHL CUP NUCLEAR SDS: 4
CHL CUP RESTING HR STRESS: 60 {beats}/min
CHL CUP STRESS STAGE 1 HR: 61 {beats}/min
CHL CUP STRESS STAGE 2 HR: 62 {beats}/min
CHL CUP STRESS STAGE 5 GRADE: 0 %
CHL CUP STRESS STAGE 6 DBP: 80 mmHg
CHL CUP STRESS STAGE 6 GRADE: 0 %
CHL CUP STRESS STAGE 6 SPEED: 0 mph
CSEPED: 0 min
CSEPEDS: 0 s
CSEPEW: 1 METS
CSEPHR: 64 %
LVDIAVOL: 138 mL (ref 62–150)
LVSYSVOL: 72 mL
Peak HR: 100 {beats}/min
Percent of predicted max HR: 64 %
SRS: 3
SSS: 3
Stage 2 Grade: 0 %
Stage 2 Speed: 0 mph
Stage 3 Grade: 0 %
Stage 3 HR: 62 {beats}/min
Stage 3 Speed: 0 mph
Stage 4 Grade: 0 %
Stage 4 HR: 100 {beats}/min
Stage 4 Speed: 0 mph
Stage 5 DBP: 80 mmHg
Stage 5 HR: 95 {beats}/min
Stage 5 SBP: 121 mmHg
Stage 5 Speed: 0 mph
Stage 6 HR: 85 {beats}/min
Stage 6 SBP: 121 mmHg
TID: 1.19

## 2015-08-21 MED ORDER — TECHNETIUM TC 99M SESTAMIBI - CARDIOLITE
31.7330 | Freq: Once | INTRAVENOUS | Status: AC | PRN
Start: 2015-08-21 — End: 2015-08-21
  Administered 2015-08-21: 31.733 via INTRAVENOUS

## 2015-08-21 MED ORDER — TECHNETIUM TC 99M SESTAMIBI - CARDIOLITE
13.0000 | Freq: Once | INTRAVENOUS | Status: AC | PRN
  Administered 2015-08-21: 14.293 via INTRAVENOUS

## 2015-08-21 MED ORDER — REGADENOSON 0.4 MG/5ML IV SOLN
0.4000 mg | Freq: Once | INTRAVENOUS | Status: AC
Start: 2015-08-21 — End: 2015-08-21
  Administered 2015-08-21: 0.4 mg via INTRAVENOUS

## 2015-08-24 ENCOUNTER — Telehealth: Payer: Self-pay | Admitting: Cardiovascular Disease

## 2015-08-24 ENCOUNTER — Other Ambulatory Visit: Payer: Self-pay

## 2015-08-24 MED ORDER — ENALAPRIL MALEATE 20 MG PO TABS: 10.0000 mg | ORAL_TABLET | Freq: Every day | ORAL | Status: DC

## 2015-08-24 MED ORDER — ISOSORBIDE MONONITRATE ER 30 MG PO TB24: 30.0000 mg | ORAL_TABLET | Freq: Every day | ORAL | Status: DC

## 2015-08-24 NOTE — Telephone Encounter (Signed)
Pt wife called, would like stress test results. Please call. 

## 2015-08-25 NOTE — Telephone Encounter (Signed)
Left message on pt home VM.  Per pt request, I left detailed message yesterday on home VM as pt states he won't remember results and recommendations and he wants wife to hear the information. Advised to wife to [facility] with any further questions or concerns.

## 2015-08-28 ENCOUNTER — Telehealth: Payer: Self-pay | Admitting: Cardiovascular Disease

## 2015-08-28 NOTE — Telephone Encounter (Signed)
Spoke w/ pt's wife.   Reviewed stress test results w/ her.  Advised her of Dr. Tyrell Antonio recommendation:  "Inform patient that stress test was mildly abnormal. I recommend adding Imdur 30 mg daily and decrease Enalapril to 10 mg daily. Follow up with me in 1 month. If no improvement in symptoms, a cardiac cath might be needed." Asked her to call back if sx continue or worsen.

## 2015-08-28 NOTE — Telephone Encounter (Signed)
Pt wife is returning out call about stress test results She states that over the weekend pt was very dizzy.  She states he's not feel well this weekend.  Not sure if that is from his heart or is it something else.  Came from church yesterday around 5 and pt went right to bed.  He is very tired and this is not normal for him She is concerned. Please call back.

## 2015-09-14 ENCOUNTER — Ambulatory Visit (INDEPENDENT_AMBULATORY_CARE_PROVIDER_SITE_OTHER): Payer: Medicare HMO | Admitting: Cardiovascular Disease

## 2015-09-14 ENCOUNTER — Ambulatory Visit
Admission: RE | Admit: 2015-09-14 | Discharge: 2015-09-14 | Disposition: A | Discharge status: Home / Self Care | Payer: Medicare HMO | Source: Ambulatory Visit | Attending: Cardiovascular Disease | Admitting: Cardiovascular Disease

## 2015-09-14 ENCOUNTER — Encounter: Payer: Self-pay | Admitting: Cardiovascular Disease

## 2015-09-14 ENCOUNTER — Other Ambulatory Visit
Admission: RE | Admit: 2015-09-14 | Discharge: 2015-09-14 | Disposition: A | Discharge status: Home / Self Care | Payer: Medicare HMO | Source: Ambulatory Visit | Attending: Cardiovascular Disease | Admitting: Cardiovascular Disease

## 2015-09-14 VITALS — BP 102/64 | HR 73 | Ht 69.5 in | Wt 190.0 lb

## 2015-09-14 DIAGNOSIS — I208 Other forms of angina pectoris: Secondary | ICD-10-CM

## 2015-09-14 DIAGNOSIS — Z01812 Encounter for preprocedural laboratory examination: Secondary | ICD-10-CM | POA: Diagnosis present

## 2015-09-14 DIAGNOSIS — Z01810 Encounter for preprocedural cardiovascular examination: Secondary | ICD-10-CM | POA: Insufficient documentation

## 2015-09-14 LAB — CBC
HCT: 40.8 % (ref 40.0–52.0)
HEMOGLOBIN: 13.8 g/dL (ref 13.0–18.0)
MCH: 32.7 pg (ref 26.0–34.0)
MCHC: 33.7 g/dL (ref 32.0–36.0)
MCV: 96.9 fL (ref 80.0–100.0)
PLATELETS: 167 10*3/uL (ref 150–440)
RBC: 4.21 MIL/uL — AB (ref 4.40–5.90)
RDW: 14.7 % — ABNORMAL HIGH (ref 11.5–14.5)
WBC: 7.2 10*3/uL (ref 3.8–10.6)

## 2015-09-14 LAB — BASIC METABOLIC PANEL
ANION GAP: 8 (ref 5–15)
BUN: 14 mg/dL (ref 6–20)
CALCIUM: 9.2 mg/dL (ref 8.9–10.3)
CO2: 26 mmol/L (ref 22–32)
CREATININE: 1.09 mg/dL (ref 0.61–1.24)
Chloride: 106 mmol/L (ref 101–111)
Glucose, Bld: 109 mg/dL — ABNORMAL HIGH (ref 65–99)
Potassium: 3.6 mmol/L (ref 3.5–5.1)
SODIUM: 140 mmol/L (ref 135–145)

## 2015-09-14 LAB — PROTIME-INR
INR: 0.97
PROTHROMBIN TIME: 13.1 s (ref 11.4–15.0)

## 2015-09-14 NOTE — Progress Notes (Signed)
Cardiology Office Note   Date:  09/14/2015   ID:  Willie Clark, DOB 1950/12/14, MRN [id number]  PCP:  [name], MD  Cardiologist:   [name], MD   Chief Complaint  Patient presents with  . other    1 month F/U. Pt C/O of headaches. Medications verbally reviewed with pt.       History of Present Illness: Willie Clark is a 65 y.o. male who presents for  a followup visit. He has known history of coronary artery disease. He is status post coronary artery bypass graft surgery in 1993 and redo in 2007.   He suffers from fibromyalgia and chronic pain.  He also has chronic systolic heart failure with mildly reduced LV systolic function. He had GI symptoms in 2015 due to documented gastric ulcer. This improved with treatment. He was seen early 2015 for recurrent episodes of substernal chest pain both at rest and with activities.  He underwent a nuclear stress test which was abnormal and showed mild anterolateral ischemia with normal ejection fraction.  I proceeded with cardiac catheterization in March of 2015 which showed significant native three-vessel coronary artery disease with patent grafts. Diagonal branches are known to be occluded and not bypassed. There was 60% stenosis in the proximal left circumflex supplying OM 3 (OM 2 is bypassed). Pressure wire interrogation showed an FFR ratio of 0.85. No revascularization was performed.  He had neck surgery last year without complications.  He was seen recently for worsening exertional chest pain. I proceeded with a nuclear stress test which showed evidence of anterolateral ischemia which is not different from before. However, there was a new area of anteroseptal ischemia. LV systolic function was mildly reduced and the study was overall moderate risk. He was placed on isosorbide with no significant improvement in symptoms. He is now complaining of daily headaches. He reports exertional chest tightness and shortness of breath even  with regular everyday activities.   Past Medical History  Diagnosis Date  . Chronic chest pain   . Stroke ([place])   . Sleep apnea   . Depression   . Bowel obstruction ([place]) 02/2009    small bowel  . Pulmonary embolism ([place]) 01/2006    Post-op/treated  . Lightheadedness   . SOB (shortness of breath)   . Syncope and collapse   . Coronary artery disease 1993    MI   . Fibromyalgia   . Hypertension   . Hyperlipidemia   . MI (myocardial infarction) ([place])   . OSA (obstructive sleep apnea)   . Kidney stones   . Barrett's esophagus   . Diverticulitis   . Urticaria   . Skin lesions, generalized   . Mass on back     Past Surgical History  Procedure Laterality Date  . Bowel obstruction  01/2009  . Shoulder surgery  2010  . Neck surgery  06/2009  . Coronary artery bypass graft  1993/01/2006    redo at [facility]. LIMA to LAD, SVG to OM2 and SVG to RPDA  . Back surgery    . Cardiac catheterization  11/2006    patent grafts. Significant OM3 disease and occluded diagonals.  . Cardiac catheterization  06/2010    patent grafts. significant ISR in proximal LCX but OM2 is bypassed and gives retrograde flow to OM3.   . Cardiac catheterization  06/2013    [facility]: Patent grafts with 60% proximal in-stent restenosis in the left circumflex with FFR of 0.85  . Cholecystectomy    .  Esophagogastroduodenoscopy N/A 10/25/2014    Procedure: ESOPHAGOGASTRODUODENOSCOPY (EGD);  Surgeon: Lollie Sails, MD;  Location: [facility] ENDOSCOPY;  Service: Endoscopy;  Laterality: N/A;     Current Outpatient Prescriptions  Medication Sig Dispense Refill  . acetaminophen (TYLENOL) 500 MG tablet Take 500 mg by mouth.    [name] aspirin EC 81 MG tablet Take 81 mg by mouth daily.    . citalopram (CELEXA) 20 MG tablet Takes 1 tablets daily.    . enalapril (VASOTEC) 10 MG tablet Take 10 mg by mouth daily.    . fexofenadine (ALLEGRA) 180 MG tablet Take 180 mg by mouth daily.    [name] FIBER PO Take by mouth daily.    . furosemide  (LASIX) 40 MG tablet Take 1 tablet (40 mg total) by mouth daily as needed. 30 tablet 3  . gabapentin (NEURONTIN) 800 MG tablet Take 800 mg by mouth 3 (three) times daily.    . isosorbide mononitrate (IMDUR) 30 MG 24 hr tablet Take 1 tablet (30 mg total) by mouth daily. 30 tablet 3  . Methylcellulose, Laxative, (SOLUBLE FIBER PO) Take by mouth daily.    . metoprolol succinate (TOPROL-XL) 25 MG 24 hr tablet Take 25 mg by mouth daily.    . Multiple Vitamins-Minerals (HAIR/SKIN/NAILS PO) Take by mouth 3 (three) times daily.    . nitroGLYCERIN (NITROSTAT) 0.4 MG SL tablet Place 0.4 mg under the tongue every 5 (five) minutes as needed.      . nortriptyline (PAMELOR) 25 MG capsule daily.    . pantoprazole (PROTONIX) 40 MG tablet Take 40 mg by mouth 2 (two) times daily.     . rosuvastatin (CRESTOR) 20 MG tablet Take 20 mg by mouth daily.      . Simethicone (GAS-X PO) Take by mouth as needed.    . sucralfate (CARAFATE) 1 G tablet Take by mouth 4 (four) times daily.     . SUCRALFATE PO Take by mouth 4 (four) times daily.    . tamsulosin (FLOMAX) 0.4 MG CAPS capsule Take 0.4 mg by mouth daily.    [name] topiramate (TOPAMAX) 50 MG tablet Take 50 mg by mouth 2 (two) times daily.     . vitamin B-12 (CYANOCOBALAMIN) 1000 MCG tablet Take 1,000 mcg by mouth daily.    . enalapril (VASOTEC) 20 MG tablet Take 0.5 tablets (10 mg total) by mouth daily. (Patient not taking: Reported on 09/14/2015) 90 tablet 0  . potassium chloride SA (K-DUR,KLOR-CON) 20 MEQ tablet Take 1 tablet (20 mEq total) by mouth daily. 30 tablet 6   No current facility-administered medications for this visit.    Allergies:   Andris Flurry; Fish oil; Lidocaine; and Other    Social History:  The patient  reports that he quit smoking about 24 years ago. His smoking use included Cigarettes. He smoked 2.50 packs per day. He does not have any smokeless tobacco history on file. He reports that he drinks alcohol. He reports that he does not use illicit  drugs.   Family History:  The patient's family history includes Heart attack in his father; Heart disease (age of onset: 76) in his father.    ROS:  Please see the history of present illness.   Otherwise, review of systems are positive for none.   All other systems are reviewed and negative.    PHYSICAL EXAM: VS:  BP 102/64 mmHg  Pulse 73  Ht 5' 9.5" (1.765 m)  Wt 190 lb (86.183 kg)  BMI 27.67 kg/m2 , BMI Body  mass index is 27.67 kg/(m^2). GEN: Well nourished, well developed, in no acute distress HEENT: normal Neck: no JVD, carotid bruits, or masses Cardiac: RRR; no murmurs, rubs, or gallops,no edema  Respiratory:  clear to auscultation bilaterally, normal work of breathing GI: soft, nontender, nondistended, + BS MS: no deformity or atrophy Skin: warm and dry, no rash Neuro:  Strength and sensation are intact Psych: euthymic mood, full affect   EKG:  EKG is not ordered today.    Recent Labs: 01/10/2015: BNP 23.8; BUN 13; Creatinine, Ser 1.29*; Potassium 4.0; Sodium 140    Lipid Panel No results found for: CHOL, TRIG, HDL, CHOLHDL, VLDL, LDLCALC, LDLDIRECT    Wt Readings from Last 3 Encounters:  09/14/15 190 lb (86.183 kg)  08/14/15 191 lb (86.637 kg)  01/10/15 195 lb 12 oz (88.792 kg)         ASSESSMENT AND PLAN:  1.  Coronary artery disease involving native coronary arteries with Class III angina: On maximal antianginal therapy in spite of treatment with a beta blocker and long-acting nitroglycerin. Recent stress test was abnormal and moderate risk overall. Due to all of this, I recommend proceeding with left heart catheterization and possible coronary angiography. I discussed risks and benefits with him. Plan access is via the left radial artery.  Given his daily headaches, I discontinued isosorbide.   2. Essential hypertension: Blood pressure is well controlled on current medications.  3. Chronic venous insufficiency: The patient had worsening edema during  last visit which has resolved completely. BNP was normal. He has not required furosemide recently.  4. Hyperlipidemia: Continue current dose of rosuvastatin with a target LDL of less than 70.    Disposition:   FU with me in 1 months  Signed,  [name], MD  09/14/2015 2:59 PM    [place]

## 2015-09-14 NOTE — Patient Instructions (Addendum)
Medication Instructions:  Your physician has recommended you make the following change in your medication:  STOP taking Imdur   Labwork: BMET, CBC, PT/INR  Testing/Procedures: A chest x-ray takes a picture of the organs and structures inside the chest, including the heart, lungs, and blood vessels. This test can show several things, including, whether the heart is enlarges; whether fluid is building up in the lungs; and whether pacemaker / defibrillator leads are still in place.  Your physician has requested that you have a cardiac catheterization. Cardiac catheterization is used to diagnose and/or treat various heart conditions. Doctors may recommend this procedure for a number of different reasons. The most common reason is to evaluate chest pain. Chest pain can be a symptom of coronary artery disease (CAD), and cardiac catheterization can show whether plaque is narrowing or blocking your heart's arteries. This procedure is also used to evaluate the valves, as well as measure the blood flow and oxygen levels in different parts of your heart. For further information please visit [email or web address]. Please follow instruction sheet, as given.   [facility] Cardiac Cath Instructions   You are scheduled for a Cardiac Cath on: Monday, June 5  Please arrive at 11:30am on the day of your procedure  Please expect a call from our [place] [facility] to pre-register you  Do not eat/drink anything after midnight  Someone will need to drive you home  It is recommended someone be with you for the first 24 hours after your procedure  Wear clothes that are easy to get on/off and wear slip on shoes if possible   Medications bring a current list of all medications with you  _xx__ You may take all of your medications the morning of your procedure with enough water to swallow safely   Day of your procedure: Arrive at the [facility] entrance.  Free valet service is available.  After  entering the [facility] please check-in at the registration desk (1st desk on your right) to receive your armband. After receiving your armband someone will escort you to the cardiac cath/special procedures waiting area.  The usual length of stay after your procedure is about 2 to 3 hours.  This can vary.  If you have any questions, please call our office at [phone], or you may call the cardiac cath lab at [facility] directly at [phone]  Follow-Up: Your physician recommends that you schedule a follow-up appointment with Dr. Fletcher Anon in one month   Any Other Special Instructions Will Be Listed Below (If Applicable).     If you need a refill on your cardiac medications before your next appointment, please call your pharmacy.  Angiogram An angiogram, also called angiography, is a procedure used to look at the blood vessels. In this procedure, dye is injected through a long, thin tube (catheter) into an artery. X-rays are then taken. The X-rays will show if there is a blockage or problem in a blood vessel.  LET [facility] CARE PROVIDER KNOW ABOUT: 8. Any allergies you have, including allergies to shellfish or contrast dye.  9. All medicines you are taking, including vitamins, herbs, eye drops, creams, and over-the-counter medicines.  10. Previous problems you or members of your family have had with the use of anesthetics.  11. Any blood disorders you have.  12. Previous surgeries you have had. 13. Any previous kidney problems or failure you have had. 14. Medical conditions you have.  15. Possibility of pregnancy, if this applies. RISKS AND COMPLICATIONS Generally, an  angiogram is a safe procedure. However, as with any procedure, problems can occur. Possible problems include:  Injury to the blood vessels, including rupture or bleeding.  Infection or bruising at the catheter site.  Allergic reaction to the dye or contrast used.  Kidney damage from the dye or contrast  used.  Blood clots that can lead to a stroke or heart attack. BEFORE THE PROCEDURE  Do not eat or drink after midnight on the night before the procedure, or as directed by your health care provider.   Ask your health care provider if you may drink enough water to take any needed medicines the morning of the procedure.  PROCEDURE  You may be given a medicine to help you relax (sedative) before and during the procedure. This medicine is given through an IV access tube that is inserted into one of your veins.   The area where the catheter will be inserted will be washed and shaved. This is usually done in the groin but may be done in the fold of your arm (near your elbow) or in the wrist.  A medicine will be given to numb the area where the catheter will be inserted (local anesthetic).  The catheter will be inserted with a guide wire into an artery. The catheter is guided by using a type of X-ray (fluoroscopy) to the blood vessel being examined.   Dye is then injected into the catheter, and X-rays are taken. The dye helps to show where any narrowing or blockages are located.  AFTER THE PROCEDURE   If the procedure is done through the leg, you will be kept in bed lying flat for several hours. You will be instructed to not bend or cross your legs.  The insertion site will be checked frequently.  The pulse in your feet or wrist will be checked frequently.  Additional blood tests, X-rays, and electrocardiography may be done.   You may need to stay in the hospital overnight for observation.    This information is not intended to replace advice given to you by your health care provider. Make sure you discuss any questions you have with your health care provider.   Document Released: 01/16/2005 Document Revised: 04/29/2014 Document Reviewed: 09/09/2012 Elsevier Interactive Patient Education 2016 [place] After Refer to this sheet in the next few weeks. These  instructions provide you with information about caring for yourself after your procedure. Your health care provider may also give you more specific instructions. Your treatment has been planned according to current medical practices, but problems sometimes occur. Call your health care provider if you have any problems or questions after your procedure. WHAT TO EXPECT AFTER THE PROCEDURE After your procedure, it is typical to have the following: 16. Bruising at the catheter insertion site that usually fades within 1-2 weeks. 17. Blood collecting in the tissue (hematoma) that may be painful to the touch. It should usually decrease in size and tenderness within 1-2 weeks. HOME CARE INSTRUCTIONS  Take medicines only as directed by your health care provider.  You may shower 24-48 hours after the procedure or as directed by your health care provider. Remove the bandage (dressing) and gently wash the site with plain soap and water. Pat the area dry with a clean towel. Do not rub the site, because this may cause bleeding.  Do not take baths, swim, or use a hot tub until your health care provider approves.  Check your insertion site every day for redness, swelling,  or drainage.  Do not apply powder or lotion to the site.  Do not lift over 10 lb (4.5 kg) for 5 days after your procedure or as directed by your health care provider.  Ask your health care provider when it is okay to:  Return to work or school.  Resume usual physical activities or sports.  Resume sexual activity.  Do not drive home if you are discharged the same day as the procedure. Have someone else drive you.  You may drive 24 hours after the procedure unless otherwise instructed by your health care provider.  Do not operate machinery or power tools for 24 hours after the procedure or as directed by your health care provider.  If your procedure was done as an outpatient procedure, which means that you went home the same day as  your procedure, a responsible adult should be with you for the first 24 hours after you arrive home.  Keep all follow-up visits as directed by your health care provider. This is important. SEEK MEDICAL CARE IF:  You have a fever.  You have chills.  You have increased bleeding from the catheter insertion site. Hold pressure on the site. SEEK IMMEDIATE MEDICAL CARE IF:  You have unusual pain at the catheter insertion site.  You have redness, warmth, or swelling at the catheter insertion site.  You have drainage (other than a small amount of blood on the dressing) from the catheter insertion site.  The catheter insertion site is bleeding, and the bleeding does not stop after 30 minutes of holding steady pressure on the site.  The area near or just beyond the catheter insertion site becomes pale, cool, tingly, or numb.   This information is not intended to replace advice given to you by your health care provider. Make sure you discuss any questions you have with your health care provider.   Document Released: 10/25/2004 Document Revised: 04/29/2014 Document Reviewed: 09/09/2012 Elsevier Interactive Patient Education [employer].

## 2015-09-22 ENCOUNTER — Telehealth: Payer: Self-pay | Admitting: Cardiovascular Disease

## 2015-09-22 NOTE — Telephone Encounter (Signed)
Reviewed cardiac cath instructions w/pt as procedure is scheduled June 5, 12:30pm He understands 11:30am arrival. Pt agreeable w/plan and had no questions.

## 2015-09-23 ENCOUNTER — Inpatient Hospital Stay
Admission: EM | Admit: 2015-09-23 | Discharge: 2015-09-25 | DRG: 694 | Disposition: A | Discharge status: Home / Self Care | Payer: Medicare HMO | Attending: Internal Medicine | Admitting: Internal Medicine

## 2015-09-23 ENCOUNTER — Other Ambulatory Visit: Payer: Self-pay

## 2015-09-23 DIAGNOSIS — Z86711 Personal history of pulmonary embolism: Secondary | ICD-10-CM

## 2015-09-23 DIAGNOSIS — I10 Essential (primary) hypertension: Secondary | ICD-10-CM | POA: Insufficient documentation

## 2015-09-23 DIAGNOSIS — I2582 Chronic total occlusion of coronary artery: Secondary | ICD-10-CM | POA: Diagnosis present

## 2015-09-23 DIAGNOSIS — I208 Other forms of angina pectoris: Secondary | ICD-10-CM

## 2015-09-23 DIAGNOSIS — R079 Chest pain, unspecified: Secondary | ICD-10-CM

## 2015-09-23 DIAGNOSIS — N135 Crossing vessel and stricture of ureter without hydronephrosis: Secondary | ICD-10-CM

## 2015-09-23 DIAGNOSIS — G4733 Obstructive sleep apnea (adult) (pediatric): Secondary | ICD-10-CM | POA: Diagnosis present

## 2015-09-23 DIAGNOSIS — I2089 Other forms of angina pectoris: Secondary | ICD-10-CM

## 2015-09-23 DIAGNOSIS — I5022 Chronic systolic (congestive) heart failure: Secondary | ICD-10-CM | POA: Insufficient documentation

## 2015-09-23 DIAGNOSIS — K2270 Barrett's esophagus without dysplasia: Secondary | ICD-10-CM | POA: Diagnosis present

## 2015-09-23 DIAGNOSIS — N132 Hydronephrosis with renal and ureteral calculous obstruction: Secondary | ICD-10-CM | POA: Diagnosis not present

## 2015-09-23 DIAGNOSIS — I5042 Chronic combined systolic (congestive) and diastolic (congestive) heart failure: Secondary | ICD-10-CM | POA: Diagnosis present

## 2015-09-23 DIAGNOSIS — N400 Enlarged prostate without lower urinary tract symptoms: Secondary | ICD-10-CM | POA: Diagnosis present

## 2015-09-23 DIAGNOSIS — E785 Hyperlipidemia, unspecified: Secondary | ICD-10-CM | POA: Diagnosis present

## 2015-09-23 DIAGNOSIS — Z01810 Encounter for preprocedural cardiovascular examination: Secondary | ICD-10-CM | POA: Insufficient documentation

## 2015-09-23 DIAGNOSIS — I252 Old myocardial infarction: Secondary | ICD-10-CM

## 2015-09-23 DIAGNOSIS — I25118 Atherosclerotic heart disease of native coronary artery with other forms of angina pectoris: Secondary | ICD-10-CM | POA: Diagnosis present

## 2015-09-23 DIAGNOSIS — F329 Major depressive disorder, single episode, unspecified: Secondary | ICD-10-CM | POA: Diagnosis present

## 2015-09-23 DIAGNOSIS — Z7982 Long term (current) use of aspirin: Secondary | ICD-10-CM

## 2015-09-23 DIAGNOSIS — Z79899 Other long term (current) drug therapy: Secondary | ICD-10-CM

## 2015-09-23 DIAGNOSIS — Z87891 Personal history of nicotine dependence: Secondary | ICD-10-CM

## 2015-09-23 DIAGNOSIS — M797 Fibromyalgia: Secondary | ICD-10-CM | POA: Diagnosis present

## 2015-09-23 DIAGNOSIS — K208 Other esophagitis: Secondary | ICD-10-CM | POA: Diagnosis present

## 2015-09-23 DIAGNOSIS — I25798 Atherosclerosis of other coronary artery bypass graft(s) with other forms of angina pectoris: Secondary | ICD-10-CM | POA: Diagnosis present

## 2015-09-23 DIAGNOSIS — I110 Hypertensive heart disease with heart failure: Secondary | ICD-10-CM | POA: Diagnosis present

## 2015-09-23 DIAGNOSIS — N139 Obstructive and reflux uropathy, unspecified: Secondary | ICD-10-CM | POA: Diagnosis present

## 2015-09-23 DIAGNOSIS — N200 Calculus of kidney: Secondary | ICD-10-CM | POA: Diagnosis not present

## 2015-09-23 DIAGNOSIS — I25810 Atherosclerosis of coronary artery bypass graft(s) without angina pectoris: Secondary | ICD-10-CM | POA: Insufficient documentation

## 2015-09-23 DIAGNOSIS — Z8249 Family history of ischemic heart disease and other diseases of the circulatory system: Secondary | ICD-10-CM

## 2015-09-23 DIAGNOSIS — Z8673 Personal history of transient ischemic attack (TIA), and cerebral infarction without residual deficits: Secondary | ICD-10-CM

## 2015-09-23 DIAGNOSIS — R9439 Abnormal result of other cardiovascular function study: Secondary | ICD-10-CM | POA: Insufficient documentation

## 2015-09-23 LAB — CBC WITH DIFFERENTIAL/PLATELET
BASOS ABS: 0 10*3/uL (ref 0–0.1)
Basophils Relative: 0 %
EOS ABS: 0.1 10*3/uL (ref 0–0.7)
Eosinophils Relative: 1 %
HCT: 44.4 % (ref 40.0–52.0)
Hemoglobin: 15 g/dL (ref 13.0–18.0)
LYMPHS ABS: 2.2 10*3/uL (ref 1.0–3.6)
MCH: 32.5 pg (ref 26.0–34.0)
MCHC: 33.9 g/dL (ref 32.0–36.0)
MCV: 96 fL (ref 80.0–100.0)
MONO ABS: 0.6 10*3/uL (ref 0.2–1.0)
Monocytes Relative: 7 %
Neutro Abs: 6.4 10*3/uL (ref 1.4–6.5)
Neutrophils Relative %: 68 %
PLATELETS: 173 10*3/uL (ref 150–440)
RBC: 4.62 MIL/uL (ref 4.40–5.90)
RDW: 14.6 % — AB (ref 11.5–14.5)
WBC: 9.3 10*3/uL (ref 3.8–10.6)

## 2015-09-23 LAB — BASIC METABOLIC PANEL
Anion gap: 9 (ref 5–15)
BUN: 12 mg/dL (ref 6–20)
CALCIUM: 9.3 mg/dL (ref 8.9–10.3)
CHLORIDE: 104 mmol/L (ref 101–111)
CO2: 26 mmol/L (ref 22–32)
CREATININE: 1.2 mg/dL (ref 0.61–1.24)
GFR calc non Af Amer: >60 mL/min (ref >60)
Glucose, Bld: 107 mg/dL — ABNORMAL HIGH (ref 65–99)
Potassium: 3.6 mmol/L (ref 3.5–5.1)
SODIUM: 139 mmol/L (ref 135–145)

## 2015-09-23 LAB — LIPASE, BLOOD: LIPASE: 44 U/L (ref 11–51)

## 2015-09-23 MED ORDER — ONDANSETRON HCL 4 MG/2ML IJ SOLN
4.0000 mg | Freq: Once | INTRAMUSCULAR | Status: AC
  Administered 2015-09-23: 4 mg via INTRAVENOUS
  Filled 2015-09-23: qty 2

## 2015-09-23 MED ORDER — MORPHINE SULFATE (PF) 4 MG/ML IV SOLN
4.0000 mg | Freq: Once | INTRAVENOUS | Status: AC
  Administered 2015-09-23: 4 mg via INTRAVENOUS
  Filled 2015-09-23: qty 1

## 2015-09-23 MED ORDER — SODIUM CHLORIDE 0.9 % IV BOLUS (SEPSIS)
1000.0000 mL | Freq: Once | INTRAVENOUS | Status: AC
  Administered 2015-09-23: 1000 mL via INTRAVENOUS

## 2015-09-23 NOTE — ED Provider Notes (Signed)
[facility] Emergency Department Provider Note   ____________________________________________  Time seen: Approximately 11:42 PM  I have reviewed the triage vital signs and the nursing notes.   HISTORY  Chief Complaint Flank Pain    HPI Willie Clark is a 65 y.o. male who presents to the ED from home via EMS with a chief complaint of left flank to abdominal pain. Symptoms began suddenly approximately 8 PM, associated with nausea only. Patient has a history of kidney stones but states he has never had pain like this with his prior stones. He has never required lithotripsy.Denies fever, chills, shortness of breath, vomiting, dysuria, hematuria, diarrhea. Denies testicular pain or swelling. Of note, patient has had intermittent chest pain and is scheduled for a cardiac catheterization on 09/25/2015. Denies recent travel or trauma. Nothing makes his symptoms better or worse.   Past Medical History  Diagnosis Date  . Chronic chest pain   . Stroke ([place])   . Sleep apnea   . Depression   . Bowel obstruction ([place]) 02/2009    small bowel  . Pulmonary embolism ([place]) 01/2006    Post-op/treated  . Lightheadedness   . SOB (shortness of breath)   . Syncope and collapse   . Coronary artery disease 1993    MI   . Fibromyalgia   . Hypertension   . Hyperlipidemia   . MI (myocardial infarction) ([place])   . OSA (obstructive sleep apnea)   . Kidney stones   . Barrett's esophagus   . Diverticulitis   . Urticaria   . Skin lesions, generalized   . Mass on back     Patient Active Problem List   Diagnosis Date Noted  . Bilateral leg edema 01/10/2015  . TIA (transient ischemic attack) 08/13/2014  . Intermittent claudication ([place]) 06/07/2013  . Abdominal pain 05/16/2012  . Dyspnea 11/21/2011  . Sinus bradycardia 07/12/2010  . ED (erectile dysfunction) 07/12/2010  . Coronary artery disease   . Hypertension   . Hyperlipidemia     Past Surgical History    Procedure Laterality Date  . Bowel obstruction  01/2009  . Shoulder surgery  2010  . Neck surgery  06/2009  . 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 LIMA to LAD, SVG to OM2 and SVG to RPDA  . Back surgery    . Cardiac catheterization  11/2006    patent grafts. Significant OM3 disease and occluded diagonals.  . Cardiac catheterization  06/2010    patent grafts. significant ISR in proximal LCX but OM2 is bypassed and gives retrograde flow to OM3.   . Cardiac catheterization  06/2013    [facility]: Patent grafts with 60% proximal in-stent restenosis in the left circumflex with FFR of 0.85  . Cholecystectomy    . Esophagogastroduodenoscopy N/A 10/25/2014    Procedure: ESOPHAGOGASTRODUODENOSCOPY (EGD);  Surgeon: Lollie Sails, MD;  Location: [facility] ENDOSCOPY;  Service: Endoscopy;  Laterality: N/A;    Current Outpatient Rx  Name  Route  Sig  Dispense  Refill  . acetaminophen (TYLENOL) 500 MG tablet   Oral   Take 500 mg by mouth.         [name] aspirin EC 81 MG tablet   Oral   Take 81 mg by mouth daily.         . citalopram (CELEXA) 20 MG tablet      Takes 1 tablets daily.         . enalapril (VASOTEC) 10 MG  tablet   Oral   Take 10 mg by mouth daily.         . enalapril (VASOTEC) 20 MG tablet   Oral   Take 0.5 tablets (10 mg total) by mouth daily. Patient not taking: Reported on 09/14/2015   90 tablet   0   . fexofenadine (ALLEGRA) 180 MG tablet   Oral   Take 180 mg by mouth daily.         [name] FIBER PO   Oral   Take by mouth daily.         . furosemide (LASIX) 40 MG tablet   Oral   Take 1 tablet (40 mg total) by mouth daily as needed.   30 tablet   3   . gabapentin (NEURONTIN) 800 MG tablet   Oral   Take 800 mg by mouth 3 (three) times daily.         . Methylcellulose, Laxative, (SOLUBLE FIBER PO)   Oral   Take by mouth daily.         . metoprolol succinate (TOPROL-XL) 25 MG 24 hr tablet   Oral   Take 25 mg by mouth daily.          . Multiple Vitamins-Minerals (HAIR/SKIN/NAILS PO)   Oral   Take by mouth 3 (three) times daily.         . nitroGLYCERIN (NITROSTAT) 0.4 MG SL tablet   Sublingual   Place 0.4 mg under the tongue every 5 (five) minutes as needed.           . nortriptyline (PAMELOR) 25 MG capsule      daily.         . pantoprazole (PROTONIX) 40 MG tablet   Oral   Take 40 mg by mouth 2 (two) times daily.          [name] EXPIRED: potassium chloride SA (K-DUR,KLOR-CON) 20 MEQ tablet   Oral   Take 1 tablet (20 mEq total) by mouth daily.   30 tablet   6   . rosuvastatin (CRESTOR) 20 MG tablet   Oral   Take 20 mg by mouth daily.           . Simethicone (GAS-X PO)   Oral   Take by mouth as needed.         . sucralfate (CARAFATE) 1 G tablet   Oral   Take by mouth 4 (four) times daily.          . SUCRALFATE PO   Oral   Take by mouth 4 (four) times daily.         . tamsulosin (FLOMAX) 0.4 MG CAPS capsule   Oral   Take 0.4 mg by mouth daily.         [name] topiramate (TOPAMAX) 50 MG tablet   Oral   Take 50 mg by mouth 2 (two) times daily.          . vitamin B-12 (CYANOCOBALAMIN) 1000 MCG tablet   Oral   Take 1,000 mcg by mouth daily.           Allergies Systems developer; Fish oil; Lidocaine; and Other  Family History  Problem Relation Age of Onset  . Heart disease Father 94  . Heart attack Father     Social History Social History  Substance Use Topics  . Smoking status: Former Smoker -- 2.50 packs/day    Types: Cigarettes    Quit date: 08/21/1991  . Smokeless tobacco: None  .  Alcohol Use: Yes    Review of Systems  Constitutional: No fever/chills. Eyes: No visual changes. ENT: No sore throat. Cardiovascular: Positive for intermittent chest pain. Respiratory: Denies shortness of breath. Gastrointestinal: Positive for abdominal pain.  Positive for nausea, no vomiting.  No diarrhea.  No constipation. Genitourinary: Negative for dysuria. Musculoskeletal: Positive  for back pain. Skin: Negative for rash. Neurological: Negative for headaches, focal weakness or numbness.  10-point ROS otherwise negative.  ____________________________________________   PHYSICAL EXAM:  VITAL SIGNS: ED Triage Vitals  Enc Vitals Group     BP 09/23/15 2257 165/100 mmHg     Pulse Rate 09/23/15 2257 71     Resp 09/23/15 2257 16     Temp 09/23/15 2256 98 F (36.7 C)     Temp src --      SpO2 09/23/15 2257 100 %     Weight 09/23/15 2256 190 lb (86.183 kg)     Height --      Head Cir --      Peak Flow --      Pain Score 09/23/15 2256 10     Pain Loc --      Pain Edu? --      Excl. in [place]? --     Constitutional: Alert and oriented. Well appearing and in moderate acute distress. Eyes: Conjunctivae are normal. PERRL. EOMI. Head: Atraumatic. Nose: No congestion/rhinnorhea. Mouth/Throat: Mucous membranes are moist.  Oropharynx non-erythematous. Neck: No stridor.   Cardiovascular: Normal rate, regular rhythm. Grossly normal heart sounds.  Good peripheral circulation. Respiratory: Normal respiratory effort.  No retractions. Lungs CTAB. Gastrointestinal: Soft and mildly tender to palpation left lower quadrant without rebound or guarding. No distention. No abdominal bruits. Mild left CVA tenderness. Musculoskeletal: No lower extremity tenderness nor edema.  No joint effusions. Neurologic:  Normal speech and language. No gross focal neurologic deficits are appreciated. No gait instability. Skin:  Skin is warm, clammy and intact. No rash noted. Psychiatric: Mood and affect are normal. Speech and behavior are normal.  ____________________________________________   LABS (all labs ordered are listed, but only abnormal results are displayed)  Labs Reviewed  BASIC METABOLIC PANEL - Abnormal; Notable for the following:    Glucose, Bld 107 (*)    All other components within normal limits  CBC WITH DIFFERENTIAL/PLATELET - Abnormal; Notable for the following:    RDW 14.6  (*)    All other components within normal limits  URINALYSIS COMPLETEWITH MICROSCOPIC ([facility] ONLY) - Abnormal; Notable for the following:    Color, Urine YELLOW (*)    APPearance HAZY (*)    Hgb urine dipstick 3+ (*)    Protein, ur 100 (*)    Leukocytes, UA TRACE (*)    Bacteria, UA RARE (*)    Squamous Epithelial / LPF 0-5 (*)    All other components within normal limits  LIPASE, BLOOD  TROPONIN I   ____________________________________________  EKG  ED ECG REPORT I, Roopa Graver J, the attending physician, personally viewed and interpreted this ECG.   Date: 09/23/2015  EKG Time: 2300  Rate: 67  Rhythm: normal EKG, normal sinus rhythm  Axis: Normal  Intervals:none  ST&T Change: Nonspecific  ____________________________________________  RADIOLOGY  CT renal study discussed with Dr. Radene Knee: 1. Bilateral ureteral obstruction noted, raising concern for impending renal failure. Urgent intervention is necessary. 2. Minimal right-sided hydronephrosis and mild-to-moderate left-sided hydronephrosis. This reflects an obstructing 7 x 5 mm stone proximally at the right ureteropelvic junction, and a large 1.1 x 0.8 cm  stone at the proximal left ureter, just below the left renal pelvis. 3. Nonobstructing bilateral renal stones measure up to 4 mm in size. 4. Scattered coronary artery calcifications seen. 5. Scattered calcification along the abdominal aorta and its branches. 6. Scattered diverticulosis along the descending and sigmoid colon, without evidence of diverticulitis. ____________________________________________   PROCEDURES  Procedure(s) performed: None  Critical Care performed: No  ____________________________________________   INITIAL IMPRESSION / ASSESSMENT AND PLAN / ED COURSE  Pertinent labs & imaging results that were available during my care of the patient were reviewed by me and considered in my medical decision making (see chart for details).  65 year old  male with a history of nephrolithiasis who presents with sudden onset left flank to abdominal pain associated with nausea. Will obtain screening lab work including troponin given patient's recent chest pains, initiate IV fluid resuscitation and analgesia, obtain CT renal colic study.  ----------------------------------------- 2:00 AM on 09/24/2015 -----------------------------------------  Discussed with urologist on call Dr. Junious Silk who feels that patient is safe for discharge home with close follow-up early next week for ureteral stenting given his lack of fever, normal WBC and normal kidney function.  ----------------------------------------- 2:15 AM on 09/24/2015 -----------------------------------------  Patient complains of persistent pain which exacerbates his chest pain. I will hold off on aspirin in the event that patient receives ureteral stenting within the next 2-3 days. Will discuss with hospitalist for evaluation in the emergency department for admission. ____________________________________________   FINAL CLINICAL IMPRESSION(S) / ED DIAGNOSES  Final diagnoses:  Chest pain, unspecified chest pain type  Kidney stones  Ureteral obstruction      NEW MEDICATIONS STARTED DURING THIS VISIT:  New Prescriptions   No medications on file     Note:  This document was prepared using Dragon voice recognition software and may include unintentional dictation errors.    Paulette Blanch, MD 09/24/15 [phone]

## 2015-09-23 NOTE — ED Notes (Signed)
Pt from home via EMS, reports L sided flank/abd pain that began at [place], reports nausea

## 2015-09-24 ENCOUNTER — Encounter
Admission: EM | Disposition: A | Discharge status: Home / Self Care | Payer: Self-pay | Source: Home / Self Care | Attending: Internal Medicine

## 2015-09-24 ENCOUNTER — Emergency Department: Payer: Medicare HMO

## 2015-09-24 ENCOUNTER — Inpatient Hospital Stay: Payer: Medicare HMO | Admitting: Anesthesiology

## 2015-09-24 ENCOUNTER — Encounter: Payer: Self-pay | Admitting: *Deleted

## 2015-09-24 DIAGNOSIS — I25118 Atherosclerotic heart disease of native coronary artery with other forms of angina pectoris: Secondary | ICD-10-CM | POA: Diagnosis present

## 2015-09-24 DIAGNOSIS — Z79899 Other long term (current) drug therapy: Secondary | ICD-10-CM | POA: Diagnosis not present

## 2015-09-24 DIAGNOSIS — M797 Fibromyalgia: Secondary | ICD-10-CM | POA: Diagnosis present

## 2015-09-24 DIAGNOSIS — I10 Essential (primary) hypertension: Secondary | ICD-10-CM | POA: Diagnosis not present

## 2015-09-24 DIAGNOSIS — N132 Hydronephrosis with renal and ureteral calculous obstruction: Secondary | ICD-10-CM | POA: Diagnosis present

## 2015-09-24 DIAGNOSIS — E785 Hyperlipidemia, unspecified: Secondary | ICD-10-CM | POA: Diagnosis present

## 2015-09-24 DIAGNOSIS — N139 Obstructive and reflux uropathy, unspecified: Secondary | ICD-10-CM | POA: Diagnosis present

## 2015-09-24 DIAGNOSIS — F329 Major depressive disorder, single episode, unspecified: Secondary | ICD-10-CM | POA: Diagnosis present

## 2015-09-24 DIAGNOSIS — I110 Hypertensive heart disease with heart failure: Secondary | ICD-10-CM | POA: Diagnosis present

## 2015-09-24 DIAGNOSIS — Z01810 Encounter for preprocedural cardiovascular examination: Secondary | ICD-10-CM | POA: Insufficient documentation

## 2015-09-24 DIAGNOSIS — I2582 Chronic total occlusion of coronary artery: Secondary | ICD-10-CM | POA: Diagnosis present

## 2015-09-24 DIAGNOSIS — K208 Other esophagitis: Secondary | ICD-10-CM | POA: Diagnosis present

## 2015-09-24 DIAGNOSIS — G4733 Obstructive sleep apnea (adult) (pediatric): Secondary | ICD-10-CM | POA: Diagnosis present

## 2015-09-24 DIAGNOSIS — Z7982 Long term (current) use of aspirin: Secondary | ICD-10-CM | POA: Diagnosis not present

## 2015-09-24 DIAGNOSIS — I25810 Atherosclerosis of coronary artery bypass graft(s) without angina pectoris: Secondary | ICD-10-CM | POA: Insufficient documentation

## 2015-09-24 DIAGNOSIS — Z8673 Personal history of transient ischemic attack (TIA), and cerebral infarction without residual deficits: Secondary | ICD-10-CM | POA: Diagnosis not present

## 2015-09-24 DIAGNOSIS — I25798 Atherosclerosis of other coronary artery bypass graft(s) with other forms of angina pectoris: Secondary | ICD-10-CM | POA: Diagnosis present

## 2015-09-24 DIAGNOSIS — I5042 Chronic combined systolic (congestive) and diastolic (congestive) heart failure: Secondary | ICD-10-CM | POA: Insufficient documentation

## 2015-09-24 DIAGNOSIS — Z8249 Family history of ischemic heart disease and other diseases of the circulatory system: Secondary | ICD-10-CM | POA: Diagnosis not present

## 2015-09-24 DIAGNOSIS — K2270 Barrett's esophagus without dysplasia: Secondary | ICD-10-CM | POA: Diagnosis present

## 2015-09-24 DIAGNOSIS — Z87891 Personal history of nicotine dependence: Secondary | ICD-10-CM | POA: Diagnosis not present

## 2015-09-24 DIAGNOSIS — N200 Calculus of kidney: Secondary | ICD-10-CM | POA: Diagnosis present

## 2015-09-24 DIAGNOSIS — R9439 Abnormal result of other cardiovascular function study: Secondary | ICD-10-CM | POA: Diagnosis not present

## 2015-09-24 DIAGNOSIS — R1032 Left lower quadrant pain: Secondary | ICD-10-CM | POA: Diagnosis not present

## 2015-09-24 DIAGNOSIS — Z86711 Personal history of pulmonary embolism: Secondary | ICD-10-CM | POA: Diagnosis not present

## 2015-09-24 DIAGNOSIS — N400 Enlarged prostate without lower urinary tract symptoms: Secondary | ICD-10-CM | POA: Diagnosis present

## 2015-09-24 DIAGNOSIS — I252 Old myocardial infarction: Secondary | ICD-10-CM | POA: Diagnosis not present

## 2015-09-24 DIAGNOSIS — I5022 Chronic systolic (congestive) heart failure: Secondary | ICD-10-CM | POA: Insufficient documentation

## 2015-09-24 DIAGNOSIS — I25119 Atherosclerotic heart disease of native coronary artery with unspecified angina pectoris: Secondary | ICD-10-CM | POA: Diagnosis not present

## 2015-09-24 DIAGNOSIS — I25708 Atherosclerosis of coronary artery bypass graft(s), unspecified, with other forms of angina pectoris: Secondary | ICD-10-CM

## 2015-09-24 HISTORY — PX: CYSTOSCOPY WITH STENT PLACEMENT: SHX5790

## 2015-09-24 LAB — URINALYSIS COMPLETE WITH MICROSCOPIC (ARMC ONLY)
Bilirubin Urine: NEGATIVE
Glucose, UA: NEGATIVE mg/dL
KETONES UR: NEGATIVE mg/dL
Nitrite: NEGATIVE
PH: 6 (ref 5.0–8.0)
PROTEIN: 100 mg/dL — AB
SPECIFIC GRAVITY, URINE: 1.018 (ref 1.005–1.030)

## 2015-09-24 LAB — TSH: TSH: 4.033 u[IU]/mL (ref 0.350–4.500)

## 2015-09-24 LAB — TROPONIN I
Troponin I: <0.03 ng/mL (ref <0.031)
Troponin I: <0.03 ng/mL (ref <0.031)
Troponin I: <0.03 ng/mL (ref <0.031)
Troponin I: <0.03 ng/mL (ref <0.031)

## 2015-09-24 LAB — HEMOGLOBIN A1C: Hgb A1c MFr Bld: 5.6 % (ref 4.0–6.0)

## 2015-09-24 SURGERY — CYSTOSCOPY, WITH STENT INSERTION
Anesthesia: General | Laterality: Bilateral | Wound class: Clean Contaminated

## 2015-09-24 MED ORDER — ONDANSETRON HCL 4 MG/2ML IJ SOLN
4.0000 mg | Freq: Once | INTRAMUSCULAR | Status: AC
Start: 2015-09-24 — End: 2015-09-24
  Administered 2015-09-24: 4 mg via INTRAVENOUS
  Filled 2015-09-24: qty 2

## 2015-09-24 MED ORDER — NITROGLYCERIN 0.4 MG SL SUBL: 0.4000 mg | SUBLINGUAL_TABLET | SUBLINGUAL | Status: DC | PRN

## 2015-09-24 MED ORDER — ONDANSETRON HCL 4 MG PO TABS: 4.0000 mg | ORAL_TABLET | Freq: Four times a day (QID) | ORAL | Status: DC | PRN

## 2015-09-24 MED ORDER — GABAPENTIN 400 MG PO CAPS
800.0000 mg | ORAL_CAPSULE | Freq: Three times a day (TID) | ORAL | Status: DC
  Administered 2015-09-24 – 2015-09-25 (×4): 800 mg via ORAL
  Filled 2015-09-24 (×4): qty 2

## 2015-09-24 MED ORDER — HYDROMORPHONE HCL 1 MG/ML IJ SOLN: 1.0000 mg | INTRAMUSCULAR | Status: DC | PRN

## 2015-09-24 MED ORDER — ONDANSETRON HCL 4 MG/2ML IJ SOLN
INTRAMUSCULAR | Status: DC | PRN
  Administered 2015-09-24: 4 mg via INTRAVENOUS

## 2015-09-24 MED ORDER — GABAPENTIN 800 MG PO TABS
800.0000 mg | ORAL_TABLET | Freq: Three times a day (TID) | ORAL | Status: DC
  Filled 2015-09-24: qty 1

## 2015-09-24 MED ORDER — POTASSIUM CHLORIDE CRYS ER 20 MEQ PO TBCR: 20.0000 meq | EXTENDED_RELEASE_TABLET | Freq: Every day | ORAL | Status: DC

## 2015-09-24 MED ORDER — TAMSULOSIN HCL 0.4 MG PO CAPS
0.4000 mg | ORAL_CAPSULE | Freq: Every day | ORAL | Status: DC
  Administered 2015-09-24 – 2015-09-25 (×2): 0.4 mg via ORAL
  Filled 2015-09-24 (×2): qty 1

## 2015-09-24 MED ORDER — ROSUVASTATIN CALCIUM 20 MG PO TABS
20.0000 mg | ORAL_TABLET | Freq: Every day | ORAL | Status: DC
  Administered 2015-09-24 – 2015-09-25 (×2): 20 mg via ORAL
  Filled 2015-09-24 (×2): qty 1

## 2015-09-24 MED ORDER — LACTATED RINGERS IV SOLN
INTRAVENOUS | Status: DC | PRN
  Administered 2015-09-24: 05:00:00 via INTRAVENOUS

## 2015-09-24 MED ORDER — HYDROMORPHONE HCL 1 MG/ML IJ SOLN
1.0000 mg | Freq: Once | INTRAMUSCULAR | Status: AC
  Administered 2015-09-24: 1 mg via INTRAVENOUS
  Filled 2015-09-24: qty 1

## 2015-09-24 MED ORDER — TOPIRAMATE 25 MG PO TABS
50.0000 mg | ORAL_TABLET | Freq: Two times a day (BID) | ORAL | Status: DC
  Administered 2015-09-24 – 2015-09-25 (×3): 50 mg via ORAL
  Filled 2015-09-24 (×3): qty 2

## 2015-09-24 MED ORDER — CALCIUM POLYCARBOPHIL 625 MG PO TABS
625.0000 mg | ORAL_TABLET | Freq: Every day | ORAL | Status: DC
  Administered 2015-09-24 – 2015-09-25 (×2): 625 mg via ORAL
  Filled 2015-09-24 (×2): qty 1

## 2015-09-24 MED ORDER — ONDANSETRON HCL 4 MG/2ML IJ SOLN
INTRAMUSCULAR | Status: AC
  Administered 2015-09-24: 4 mg
  Filled 2015-09-24: qty 2

## 2015-09-24 MED ORDER — SODIUM CHLORIDE 0.9 % IV BOLUS (SEPSIS)
1000.0000 mL | Freq: Once | INTRAVENOUS | Status: AC
  Administered 2015-09-24: 1000 mL via INTRAVENOUS

## 2015-09-24 MED ORDER — FENTANYL CITRATE (PF) 100 MCG/2ML IJ SOLN
INTRAMUSCULAR | Status: AC
  Administered 2015-09-24: 25 ug via INTRAVENOUS
  Filled 2015-09-24: qty 2

## 2015-09-24 MED ORDER — CITALOPRAM HYDROBROMIDE 20 MG PO TABS
20.0000 mg | ORAL_TABLET | Freq: Every day | ORAL | Status: DC
  Administered 2015-09-24 – 2015-09-25 (×2): 20 mg via ORAL
  Filled 2015-09-24 (×2): qty 1

## 2015-09-24 MED ORDER — LORATADINE 10 MG PO TABS
10.0000 mg | ORAL_TABLET | Freq: Every day | ORAL | Status: DC
  Administered 2015-09-24 – 2015-09-25 (×2): 10 mg via ORAL
  Filled 2015-09-24 (×2): qty 1

## 2015-09-24 MED ORDER — SODIUM CHLORIDE 0.9 % IV SOLN
INTRAVENOUS | Status: DC
  Administered 2015-09-24 – 2015-09-25 (×3): via INTRAVENOUS

## 2015-09-24 MED ORDER — SODIUM CHLORIDE 0.9% FLUSH: 3.0000 mL | Freq: Two times a day (BID) | INTRAVENOUS | Status: DC

## 2015-09-24 MED ORDER — FUROSEMIDE 40 MG PO TABS: 40.0000 mg | ORAL_TABLET | Freq: Every day | ORAL | Status: DC | PRN

## 2015-09-24 MED ORDER — TAMSULOSIN HCL 0.4 MG PO CAPS
0.4000 mg | ORAL_CAPSULE | Freq: Once | ORAL | Status: AC
  Administered 2015-09-24: 0.4 mg via ORAL
  Filled 2015-09-24: qty 1

## 2015-09-24 MED ORDER — EPHEDRINE SULFATE 50 MG/ML IJ SOLN
INTRAMUSCULAR | Status: DC | PRN
  Administered 2015-09-24: 10 mg via INTRAVENOUS

## 2015-09-24 MED ORDER — CEFAZOLIN SODIUM-DEXTROSE 2-4 GM/100ML-% IV SOLN
2.0000 g | INTRAVENOUS | Status: AC
  Administered 2015-09-24: 2 g via INTRAVENOUS

## 2015-09-24 MED ORDER — VITAMIN B-12 1000 MCG PO TABS
1000.0000 ug | ORAL_TABLET | Freq: Every day | ORAL | Status: DC
  Administered 2015-09-24 – 2015-09-25 (×2): 1000 ug via ORAL
  Filled 2015-09-24 (×2): qty 1

## 2015-09-24 MED ORDER — DOCUSATE SODIUM 100 MG PO CAPS
100.0000 mg | ORAL_CAPSULE | Freq: Two times a day (BID) | ORAL | Status: DC
  Administered 2015-09-24 – 2015-09-25 (×3): 100 mg via ORAL
  Filled 2015-09-24 (×3): qty 1

## 2015-09-24 MED ORDER — FENTANYL CITRATE (PF) 100 MCG/2ML IJ SOLN
25.0000 ug | INTRAMUSCULAR | Status: DC | PRN
  Administered 2015-09-24 (×4): 25 ug via INTRAVENOUS

## 2015-09-24 MED ORDER — ONDANSETRON HCL 4 MG/2ML IJ SOLN: 4.0000 mg | Freq: Four times a day (QID) | INTRAMUSCULAR | Status: DC | PRN

## 2015-09-24 MED ORDER — ACETAMINOPHEN 650 MG RE SUPP: 650.0000 mg | Freq: Four times a day (QID) | RECTAL | Status: DC | PRN

## 2015-09-24 MED ORDER — PROPOFOL 10 MG/ML IV BOLUS
INTRAVENOUS | Status: DC | PRN
  Administered 2015-09-24: 200 mg via INTRAVENOUS

## 2015-09-24 MED ORDER — ENALAPRIL MALEATE 10 MG PO TABS
10.0000 mg | ORAL_TABLET | Freq: Every day | ORAL | Status: DC
  Administered 2015-09-24 – 2015-09-25 (×2): 10 mg via ORAL
  Filled 2015-09-24 (×2): qty 1

## 2015-09-24 MED ORDER — FENTANYL CITRATE (PF) 100 MCG/2ML IJ SOLN
INTRAMUSCULAR | Status: DC | PRN
  Administered 2015-09-24: 50 ug via INTRAVENOUS

## 2015-09-24 MED ORDER — ASPIRIN EC 81 MG PO TBEC
81.0000 mg | DELAYED_RELEASE_TABLET | Freq: Every day | ORAL | Status: DC
  Administered 2015-09-24 – 2015-09-25 (×3): 81 mg via ORAL
  Filled 2015-09-24 (×4): qty 1

## 2015-09-24 MED ORDER — PANTOPRAZOLE SODIUM 40 MG PO TBEC
40.0000 mg | DELAYED_RELEASE_TABLET | Freq: Two times a day (BID) | ORAL | Status: DC
Start: 2015-09-24 — End: 2015-09-25
  Administered 2015-09-24 – 2015-09-25 (×3): 40 mg via ORAL
  Filled 2015-09-24 (×3): qty 1

## 2015-09-24 MED ORDER — METOPROLOL SUCCINATE ER 25 MG PO TB24
25.0000 mg | ORAL_TABLET | Freq: Every day | ORAL | Status: DC
  Administered 2015-09-24 – 2015-09-25 (×2): 25 mg via ORAL
  Filled 2015-09-24 (×2): qty 1

## 2015-09-24 MED ORDER — ONDANSETRON HCL 4 MG/2ML IJ SOLN: 4.0000 mg | Freq: Once | INTRAMUSCULAR | Status: DC | PRN

## 2015-09-24 MED ORDER — ACETAMINOPHEN 325 MG PO TABS
650.0000 mg | ORAL_TABLET | Freq: Four times a day (QID) | ORAL | Status: DC | PRN
Start: 2015-09-24 — End: 2015-09-25
  Administered 2015-09-24 – 2015-09-25 (×2): 650 mg via ORAL
  Filled 2015-09-24 (×2): qty 2

## 2015-09-24 MED ORDER — POTASSIUM CHLORIDE CRYS ER 20 MEQ PO TBCR
20.0000 meq | EXTENDED_RELEASE_TABLET | Freq: Every day | ORAL | Status: DC
  Administered 2015-09-24 – 2015-09-25 (×2): 20 meq via ORAL
  Filled 2015-09-24 (×2): qty 1

## 2015-09-24 MED ORDER — SUCRALFATE 1 G PO TABS
1.0000 g | ORAL_TABLET | Freq: Four times a day (QID) | ORAL | Status: DC
  Administered 2015-09-24 – 2015-09-25 (×6): 1 g via ORAL
  Filled 2015-09-24 (×6): qty 1

## 2015-09-24 MED ORDER — NORTRIPTYLINE HCL 25 MG PO CAPS
25.0000 mg | ORAL_CAPSULE | Freq: Every day | ORAL | Status: DC
  Administered 2015-09-24 – 2015-09-25 (×2): 25 mg via ORAL
  Filled 2015-09-24 (×2): qty 1

## 2015-09-24 SURGICAL SUPPLY — 33 items
BAG DRAIN CYSTO-URO LG1000N (MISCELLANEOUS) ×3 IMPLANT
CATH COUDE FOLEY 2W 5CC 18FR (CATHETERS) ×3 IMPLANT
CATH FOL LX CONE TIP  8F (CATHETERS)
CATH FOL LX CONE TIP 8F (CATHETERS) IMPLANT
CATH URETL 5X70 OPEN END (CATHETERS) ×3 IMPLANT
CATH URETL OPEN END 6X70 (CATHETERS) IMPLANT
CONRAY 43 FOR UROLOGY 50M (MISCELLANEOUS) ×3 IMPLANT
GLOVE BIO SURGEON STRL SZ7 (GLOVE) IMPLANT
GLOVE BIO SURGEON STRL SZ7.5 (GLOVE) ×6 IMPLANT
GLOVE BIO SURGEON STRL SZ8 (GLOVE) ×6 IMPLANT
GOWN STRL REUS W/ TWL LRG LVL3 (GOWN DISPOSABLE) ×1 IMPLANT
GOWN STRL REUS W/ TWL LRG LVL4 (GOWN DISPOSABLE) ×1 IMPLANT
GOWN STRL REUS W/ TWL XL LVL3 (GOWN DISPOSABLE) IMPLANT
GOWN STRL REUS W/TWL LRG LVL3 (GOWN DISPOSABLE) ×2
GOWN STRL REUS W/TWL LRG LVL4 (GOWN DISPOSABLE) ×2
GOWN STRL REUS W/TWL XL LVL3 (GOWN DISPOSABLE)
GUIDEWIRE STR ZIPWIRE 035X150 (MISCELLANEOUS) ×3 IMPLANT
HOLDER FOLEY CATH W/STRAP (MISCELLANEOUS) ×3 IMPLANT
NS IRRIG 500ML POUR BTL (IV SOLUTION) ×3 IMPLANT
PACK CYSTO AR (MISCELLANEOUS) ×3 IMPLANT
PREP PVP WINGED SPONGE (MISCELLANEOUS) IMPLANT
SENSORWIRE 0.038 NOT ANGLED (WIRE) ×3
SET CYSTO W/LG BORE CLAMP LF (SET/KITS/TRAYS/PACK) ×3 IMPLANT
SOL .9 NS 3000ML IRR  AL (IV SOLUTION) ×2
SOL .9 NS 3000ML IRR UROMATIC (IV SOLUTION) ×1 IMPLANT
SOL PREP PVP 2OZ (MISCELLANEOUS) ×3
SOLUTION PREP PVP 2OZ (MISCELLANEOUS) ×1 IMPLANT
STENT URET 6FRX24 CONTOUR (STENTS) IMPLANT
STENT URET 6FRX26 CONTOUR (STENTS) ×6 IMPLANT
SURGILUBE 2OZ TUBE FLIPTOP (MISCELLANEOUS) ×3 IMPLANT
SYRINGE 10CC LL (SYRINGE) ×3 IMPLANT
WATER STERILE IRR 1000ML POUR (IV SOLUTION) ×3 IMPLANT
WIRE SENSOR 0.038 NOT ANGLED (WIRE) ×1 IMPLANT

## 2015-09-24 NOTE — Transfer of Care (Signed)
Immediate Anesthesia Transfer of Care Note  Patient: Willie Clark  Procedure(s) Performed: Procedure(s): CYSTOSCOPY WITH BILATERAL RETROGRADES, BILATERAL STENT PLACEMENT (Bilateral)  Patient Location: PACU  Anesthesia Type:General  Level of Consciousness: Alert, Awake, Oriented  Airway & Oxygen Therapy: Patient Spontanous Breathing  Post-op Assessment: Report given to RN  Post vital signs: Reviewed and stable  Last Vitals:  Filed Vitals:   09/24/15 0413 09/24/15 0633  BP: 137/88 132/91  Pulse: 77 93  Temp: 36.9 C 36.3 C  Resp: 14 15    Complications: No apparent anesthesia complications

## 2015-09-24 NOTE — Anesthesia Postprocedure Evaluation (Signed)
Anesthesia Post Note  Patient: Willie Clark  Procedure(s) Performed: Procedure(s) (LRB): CYSTOSCOPY WITH BILATERAL RETROGRADES, BILATERAL STENT PLACEMENT (Bilateral)  Patient location during evaluation: PACU Anesthesia Type: General Level of consciousness: awake Pain management: pain level controlled Vital Signs Assessment: post-procedure vital signs reviewed and stable Respiratory status: spontaneous breathing Cardiovascular status: blood pressure returned to baseline Anesthetic complications: no    Last Vitals:  Filed Vitals:   09/24/15 0633 09/24/15 0634  BP: 132/91 132/91  Pulse: 93 90  Temp: 36.3 C 36.3 C  Resp: 15 17    Last Pain:  Filed Vitals:   09/24/15 0636  PainSc: 5                  VAN STAVEREN,Reilley Latorre

## 2015-09-24 NOTE — Anesthesia Preprocedure Evaluation (Addendum)
Anesthesia Evaluation  Patient identified by MRN, date of birth, ID band Patient awake    Reviewed: Allergy & Precautions, NPO status   Airway Mallampati: III       Dental  (+) Teeth Intact   Pulmonary shortness of breath and with exertion, former smoker,    breath sounds clear to auscultation       Cardiovascular hypertension, Pt. on home beta blockers + CAD and + Past MI   Rhythm:Regular Rate:Normal     Neuro/Psych Depression TIACVA    GI/Hepatic negative GI ROS, Neg liver ROS,   Endo/Other    Renal/GU Renal disease     Musculoskeletal   Abdominal Normal abdominal exam  (+)   Peds  Hematology   Anesthesia Other Findings   Reproductive/Obstetrics                            Anesthesia Physical Anesthesia Plan  ASA: IV and emergent  Anesthesia Plan: General   Post-op Pain Management:    Induction: Intravenous  Airway Management Planned: LMA  Additional Equipment:   Intra-op Plan:   Post-operative Plan: Extubation in OR  Informed Consent: I have reviewed the patients History and Physical, chart, labs and discussed the procedure including the risks, benefits and alternatives for the proposed anesthesia with the patient or authorized representative who has indicated his/her understanding and acceptance.     Plan Discussed with: CRNA  Anesthesia Plan Comments:         Anesthesia Quick Evaluation

## 2015-09-24 NOTE — OR Nursing (Signed)
[place] 43 mixed 1:1 with water, 30 ml mixture used for cystoscopy with stent procedure

## 2015-09-24 NOTE — H&P (Signed)
Willie Clark is an 65 y.o. male.   Chief Complaint: Abdominal pain HPI: The patient with past medical history of coronary artery disease status post CABG ('93 and '07) presents to the emergency department via EMS after complaining of abdominal and chest pain. He says the latter started after his flanks and abdomen began to hurt. He has had intermittent nausea associated with his abdominal pain. Once his chest began to hurt he also admits to diaphoresis but denies shortness of breath. In the emergency department CT of the abdomen revealed a 1 cm kidney stone on one side and a 7 mm kidney stone on the other with associated hydronephrosis. Initial troponin was negative and there are no associated EKG changes however the patient did have a positive stress test 2 weeks ago and is due for heart catheterization tomorrow. Due to obstructive uropathy emergency department staff called for admission.  Past Medical History  Diagnosis Date  . Chronic chest pain   . Stroke ([place])   . Sleep apnea   . Depression   . Bowel obstruction ([place]) 02/2009    small bowel  . Pulmonary embolism ([place]) 01/2006    Post-op/treated  . Lightheadedness   . SOB (shortness of breath)   . Syncope and collapse   . Coronary artery disease 1993    MI   . Fibromyalgia   . Hypertension   . Hyperlipidemia   . MI (myocardial infarction) ([place])   . OSA (obstructive sleep apnea)   . Kidney stones   . Barrett's esophagus   . Diverticulitis   . Urticaria   . Skin lesions, generalized   . Mass on back     Past Surgical History  Procedure Laterality Date  . Bowel obstruction  01/2009  . Shoulder surgery  2010  . Neck surgery  06/2009  . Coronary artery bypass graft  1993/01/2006    redo at [facility]. LIMA to LAD, SVG to OM2 and SVG to RPDA  . Back surgery    . Cardiac catheterization  11/2006    patent grafts. Significant OM3 disease and occluded diagonals.  . Cardiac catheterization  06/2010    patent grafts. significant ISR in  proximal LCX but OM2 is bypassed and gives retrograde flow to OM3.   . Cardiac catheterization  06/2013    [facility]: Patent grafts with 60% proximal in-stent restenosis in the left circumflex with FFR of 0.85  . Cholecystectomy    . Esophagogastroduodenoscopy N/A 10/25/2014    Procedure: ESOPHAGOGASTRODUODENOSCOPY (EGD);  Surgeon: Lollie Sails, MD;  Location: [facility] ENDOSCOPY;  Service: Endoscopy;  Laterality: N/A;    Family History  Problem Relation Age of Onset  . Heart disease Father 44  . Heart attack Father    Social History:  reports that he quit smoking about 24 years ago. His smoking use included Cigarettes. He smoked 2.50 packs per day. He does not have any smokeless tobacco history on file. He reports that he drinks alcohol. He reports that he does not use illicit drugs.  Allergies:  Allergies  Allergen Reactions  . [employer]   . Fish Oil Itching  . Lidocaine Nausea And Vomiting  . Other Diarrhea    Reactive agents: Onions, bacon     (Not in a hospital admission)  Results for orders placed or performed during the hospital encounter of 09/23/15 (from the past 48 hour(s))  Troponin I     Status: None   Collection Time: 09/22/15 11:35 PM  Result Value Ref Range  Troponin I <0.03 <0.031 ng/mL    Comment:        NO INDICATION OF MYOCARDIAL INJURY.   Basic metabolic panel     Status: Abnormal   Collection Time: 09/23/15 11:05 PM  Result Value Ref Range   Sodium 139 135 - 145 mmol/L   Potassium 3.6 3.5 - 5.1 mmol/L   Chloride 104 101 - 111 mmol/L   CO2 26 22 - 32 mmol/L   Glucose, Bld 107 (H) 65 - 99 mg/dL   BUN 12 6 - 20 mg/dL   Creatinine, Ser 1.20 0.61 - 1.24 mg/dL   Calcium 9.3 8.9 - 10.3 mg/dL   GFR calc non Af Amer >60 >60 mL/min   GFR calc Af Amer >60 >60 mL/min    Comment: (NOTE) The eGFR has been calculated using the CKD EPI equation. This calculation has not been validated in all clinical situations. eGFR's persistently <60 mL/min signify possible  Chronic Kidney Disease.    Anion gap 9 5 - 15  Lipase, blood     Status: None   Collection Time: 09/23/15 11:05 PM  Result Value Ref Range   Lipase 44 11 - 51 U/L  CBC WITH DIFFERENTIAL     Status: Abnormal   Collection Time: 09/23/15 11:05 PM  Result Value Ref Range   WBC 9.3 3.8 - 10.6 K/uL   RBC 4.62 4.40 - 5.90 MIL/uL   Hemoglobin 15.0 13.0 - 18.0 g/dL   HCT 44.4 40.0 - 52.0 %   MCV 96.0 80.0 - 100.0 fL   MCH 32.5 26.0 - 34.0 pg   MCHC 33.9 32.0 - 36.0 g/dL   RDW 14.6 (H) 11.5 - 14.5 %   Platelets 173 150 - 440 K/uL   Neutrophils Relative % 68% %   Neutro Abs 6.4 1.4 - 6.5 K/uL   Lymphocytes Relative 24% %   Lymphs Abs 2.2 1.0 - 3.6 K/uL   Monocytes Relative 7% %   Monocytes Absolute 0.6 0.2 - 1.0 K/uL   Eosinophils Relative 1% %   Eosinophils Absolute 0.1 0 - 0.7 K/uL   Basophils Relative 0% %   Basophils Absolute 0.0 0 - 0.1 K/uL  Urinalysis complete, with microscopic ([facility] only)     Status: Abnormal   Collection Time: 09/23/15 11:51 PM  Result Value Ref Range   Color, Urine YELLOW (A) YELLOW   APPearance HAZY (A) CLEAR   Glucose, UA NEGATIVE NEGATIVE mg/dL   Bilirubin Urine NEGATIVE NEGATIVE   Ketones, ur NEGATIVE NEGATIVE mg/dL   Specific Gravity, Urine 1.018 1.005 - 1.030   Hgb urine dipstick 3+ (A) NEGATIVE   pH 6.0 5.0 - 8.0   Protein, ur 100 (A) NEGATIVE mg/dL   Nitrite NEGATIVE NEGATIVE   Leukocytes, UA TRACE (A) NEGATIVE   RBC / HPF TOO NUMEROUS TO COUNT 0 - 5 RBC/hpf   WBC, UA 6-30 0 - 5 WBC/hpf   Bacteria, UA RARE (A) NONE SEEN   Squamous Epithelial / LPF 0-5 (A) NONE SEEN   Mucous PRESENT    Ct Renal Stone Study  09/24/2015  CLINICAL DATA:  Acute onset of left-sided abdominal and pelvic pain. Nausea. Initial encounter. EXAM: CT ABDOMEN AND PELVIS WITHOUT CONTRAST TECHNIQUE: Multidetector CT imaging of the abdomen and pelvis was performed following the standard protocol without IV contrast. COMPARISON:  CT of the abdomen and pelvis from 10/18/2014,  and MRI of the lumbar spine performed 04/18/2015 FINDINGS: Minimal bibasilar atelectasis noted. Scattered coronary artery calcifications are seen. The  liver and spleen are unremarkable in appearance. The patient is status post cholecystectomy, with clips noted at the gallbladder fossa. The pancreas and adrenal glands are unremarkable. There is minimal right-sided hydronephrosis and mild-to-moderate left-sided hydronephrosis. This reflects an obstructing 7 x 5 mm stone proximally, at the right ureteropelvic junction, and a large 1.1 x 0.8 cm stone at the proximal left ureter, just below the left renal pelvis. Left-sided perinephric stranding is noted. Minimal right-sided perinephric stranding is also seen. Nonobstructing bilateral renal stones measure up to 4 mm in size. No free fluid is identified. The small bowel is unremarkable in appearance. The stomach is within normal limits. No acute vascular abnormalities are seen. Scattered calcification is seen along the abdominal aorta and its branches. The appendix is normal in caliber, without evidence of appendicitis. Scattered diverticulosis is noted along the descending and sigmoid colon, without evidence of diverticulitis. The bladder is mildly distended and grossly unremarkable. The prostate is normal in size. No inguinal lymphadenopathy is seen. No acute osseous abnormalities are identified. IMPRESSION: 1. Bilateral ureteral obstruction noted, raising concern for impending renal failure. Urgent intervention is necessary. 2. Minimal right-sided hydronephrosis and mild-to-moderate left-sided hydronephrosis. This reflects an obstructing 7 x 5 mm stone proximally at the right ureteropelvic junction, and a large 1.1 x 0.8 cm stone at the proximal left ureter, just below the left renal pelvis. 3. Nonobstructing bilateral renal stones measure up to 4 mm in size. 4. Scattered coronary artery calcifications seen. 5. Scattered calcification along the abdominal aorta and its  branches. 6. Scattered diverticulosis along the descending and sigmoid colon, without evidence of diverticulitis. These results were called by telephone at the time of interpretation on 09/24/2015 at 1:11 am to Dr. Lurline Hare, who verbally acknowledged these results. Electronically Signed   By: Garald Balding M.D.   On: 09/24/2015 01:16    Review of Systems  Constitutional: Negative for fever and chills.  HENT: Negative for sore throat and tinnitus.   Eyes: Negative for blurred vision and redness.  Respiratory: Negative for cough and shortness of breath.   Cardiovascular: Positive for chest pain. Negative for palpitations, orthopnea and PND.  Gastrointestinal: Positive for abdominal pain (flanks bilaterally). Negative for nausea, vomiting and diarrhea.  Genitourinary: Negative for dysuria, urgency and frequency.  Musculoskeletal: Negative for myalgias and joint pain.  Skin: Negative for rash.       No lesions  Neurological: Negative for speech change, focal weakness and weakness.  Endo/Heme/Allergies: Does not bruise/bleed easily.       No temperature intolerance  Psychiatric/Behavioral: Negative for depression and suicidal ideas.    Blood pressure 136/86, pulse 70, temperature 98 F (36.7 C), resp. rate 12, weight 86.183 kg (190 lb), SpO2 96 %. Physical Exam  Constitutional: He is oriented to person, place, and time. He appears well-developed and well-nourished. No distress.  HENT:  Head: Normocephalic and atraumatic.  Mouth/Throat: Oropharynx is clear and moist.  Eyes: Conjunctivae and EOM are normal. Pupils are equal, round, and reactive to light. No scleral icterus.  Neck: Normal range of motion. Neck supple. No JVD present. No tracheal deviation present. No thyromegaly present.  Cardiovascular: Normal rate, regular rhythm and normal heart sounds.  Exam reveals no gallop and no friction rub.   No murmur heard. Respiratory: Effort normal and breath sounds normal. No respiratory  distress.  GI: Soft. Bowel sounds are normal. He exhibits no distension. There is no tenderness.  Genitourinary:  Deferred  Musculoskeletal: Normal range of motion. He exhibits no  edema.  Lymphadenopathy:    He has no cervical adenopathy.  Neurological: He is alert and oriented to person, place, and time. No cranial nerve deficit.  Skin: Skin is warm and dry. No rash noted. No erythema.  Psychiatric: He has a normal mood and affect. His behavior is normal. Judgment and thought content normal.     Assessment/Plan This is a 65 year old male admitted for nephrolithiasis and hydronephrosis. 1. Nephrolithiasis: Bilateral with associated hydronephrosis. Renal function is normal and the patient has been making urine but he will not be able to pass either stone. Urology has been consulted and we will remove both calculi plus or minus stents. No evidence of infection at this time. 2. Chest pain: Likely secondary to stress and demand ischemia. Continue to follow cardiac enzymes and place patient on telemetry. He was scheduled for heart catheterization tomorrow but I will still have cardiology see him prior to urology intervention. Continue aspirin 3. Essential hypertension: Continue ACE inhibitor and metoprolol 4. Hyperlipidemia: Continue statin therapy 5. Erosive esophagitis: Continue sucralfate and 6. BPH: Continue tamsulosin 7. Depression: Continue Celexa  8. Fibromyalgia: Continue gabapentin, Topamax and nortriptyline 9. DVT prophylaxis: SCDs (may start pharmacologic prophylaxis when cleared postoperatively) 10. GI prophylaxis: Pantoprazole per home regimen The patient is a full code. Time spent on admission orders and patient care approximately 45 minutes  Harrie Foreman, MD 09/24/2015, 3:21 AM

## 2015-09-24 NOTE — Progress Notes (Signed)
Chart and imaging reviewed - pt with 7 mm right UPJ stone and hydro and 11 mm left proximal ureteral stone and hydro. Kidney fxn nl, heart cath scheduled Monday. Agree with hospitalist admission and ureteral stenting prior to heart cath. Full consult to follow. In long run, pt can undergo ureteroscopy on anticogulation.  Discussed with Dr. Beather Arbour.

## 2015-09-24 NOTE — Consult Note (Signed)
Consult: Bilateral ureteral stones with bilateral hydronephrosis Requested by: Dr. Beather Arbour  History of Present Illness: 65 year old white male with cardiac history is scheduled for heart catheterization Monday but developed severe left flank pain earlier tonight and called 911 to come to the emergency department. CT scan of the abdomen and pelvis revealed a 7 mm right proximal/UPJ stone with mild hydronephrosis and a 10 mm left proximal ureteral stone with hydronephrosis. There are some other scattered small stones in the kidneys. Creatinine was 1.2. UA with too numerous to count red cells and rare bacteria. He has a history of kidney stones but no stone surgery. In fact CT scan from last summer showed the right proximal stone in the right lower pole on the left proximal stone in the left upper pole.   Past Medical History  Diagnosis Date  . Chronic chest pain   . Stroke ([place])   . Sleep apnea   . Depression   . Bowel obstruction ([place]) 02/2009    small bowel  . Pulmonary embolism ([place]) 01/2006    Post-op/treated  . Lightheadedness   . SOB (shortness of breath)   . Syncope and collapse   . Coronary artery disease 1993    MI   . Fibromyalgia   . Hypertension   . Hyperlipidemia   . MI (myocardial infarction) ([place])   . OSA (obstructive sleep apnea)   . Kidney stones   . Barrett's esophagus   . Diverticulitis   . Urticaria   . Skin lesions, generalized   . Mass on back    Past Surgical History  Procedure Laterality Date  . Bowel obstruction  01/2009  . Shoulder surgery  2010  . Neck surgery  06/2009  . Coronary artery bypass graft  1993/01/2006    redo at [facility]. LIMA to LAD, SVG to OM2 and SVG to RPDA  . Back surgery    . Cardiac catheterization  11/2006    patent grafts. Significant OM3 disease and occluded diagonals.  . Cardiac catheterization  06/2010    patent grafts. significant ISR in proximal LCX but OM2 is bypassed and gives retrograde flow to OM3.   . Cardiac catheterization   06/2013    [facility]: Patent grafts with 60% proximal in-stent restenosis in the left circumflex with FFR of 0.85  . Cholecystectomy    . Esophagogastroduodenoscopy N/A 10/25/2014    Procedure: ESOPHAGOGASTRODUODENOSCOPY (EGD);  Surgeon: Lollie Sails, MD;  Location: [facility] ENDOSCOPY;  Service: Endoscopy;  Laterality: N/A;    Home Medications:   (Not in a hospital admission) Allergies:  Allergies  Allergen Reactions  . [employer]   . Fish Oil Itching  . Lidocaine Nausea And Vomiting  . Other Diarrhea    Reactive agents: Onions, bacon    Family History  Problem Relation Age of Onset  . Heart disease Father 28  . Heart attack Father    Social History:  reports that he quit smoking about 24 years ago. His smoking use included Cigarettes. He smoked 2.50 packs per day. He does not have any smokeless tobacco history on file. He reports that he drinks alcohol. He reports that he does not use illicit drugs.  ROS: A complete review of systems was performed.  All systems are negative except for pertinent findings as noted. ROS   Physical Exam:  Vital signs in last 24 hours: Temp:  [98 F (36.7 C)-98.4 F (36.9 C)] 98.4 F (36.9 C) (06/04 0413) Pulse Rate:  [64-77] 77 (06/04 0413) Resp:  [  11-20] 14 (06/04 0413) BP: (119-167)/(83-105) 137/88 mmHg (06/04 0413) SpO2:  [93 %-100 %] 97 % (06/04 0413) Weight:  [86.183 kg (190 lb)] 86.183 kg (190 lb) (06/03 2256) General:  Alert and oriented, No acute distress HEENT: Normocephalic, atraumatic Neck: No JVD or lymphadenopathy Cardiovascular: Regular rate and rhythm Lungs: Regular rate and effort Extremities: No edema Neurologic: Grossly intact  Laboratory Data:  Results for orders placed or performed during the hospital encounter of 09/23/15 (from the past 24 hour(s))  Basic metabolic panel     Status: Abnormal   Collection Time: 09/23/15 11:05 PM  Result Value Ref Range   Sodium 139 135 - 145 mmol/L   Potassium 3.6 3.5 - 5.1 mmol/L    Chloride 104 101 - 111 mmol/L   CO2 26 22 - 32 mmol/L   Glucose, Bld 107 (H) 65 - 99 mg/dL   BUN 12 6 - 20 mg/dL   Creatinine, Ser 1.20 0.61 - 1.24 mg/dL   Calcium 9.3 8.9 - 10.3 mg/dL   GFR calc non Af Amer >60 >60 mL/min   GFR calc Af Amer >60 >60 mL/min   Anion gap 9 5 - 15  Lipase, blood     Status: None   Collection Time: 09/23/15 11:05 PM  Result Value Ref Range   Lipase 44 11 - 51 U/L  CBC WITH DIFFERENTIAL     Status: Abnormal   Collection Time: 09/23/15 11:05 PM  Result Value Ref Range   WBC 9.3 3.8 - 10.6 K/uL   RBC 4.62 4.40 - 5.90 MIL/uL   Hemoglobin 15.0 13.0 - 18.0 g/dL   HCT 44.4 40.0 - 52.0 %   MCV 96.0 80.0 - 100.0 fL   MCH 32.5 26.0 - 34.0 pg   MCHC 33.9 32.0 - 36.0 g/dL   RDW 14.6 (H) 11.5 - 14.5 %   Platelets 173 150 - 440 K/uL   Neutrophils Relative % 68% %   Neutro Abs 6.4 1.4 - 6.5 K/uL   Lymphocytes Relative 24% %   Lymphs Abs 2.2 1.0 - 3.6 K/uL   Monocytes Relative 7% %   Monocytes Absolute 0.6 0.2 - 1.0 K/uL   Eosinophils Relative 1% %   Eosinophils Absolute 0.1 0 - 0.7 K/uL   Basophils Relative 0% %   Basophils Absolute 0.0 0 - 0.1 K/uL  Urinalysis complete, with microscopic ([facility] only)     Status: Abnormal   Collection Time: 09/23/15 11:51 PM  Result Value Ref Range   Color, Urine YELLOW (A) YELLOW   APPearance HAZY (A) CLEAR   Glucose, UA NEGATIVE NEGATIVE mg/dL   Bilirubin Urine NEGATIVE NEGATIVE   Ketones, ur NEGATIVE NEGATIVE mg/dL   Specific Gravity, Urine 1.018 1.005 - 1.030   Hgb urine dipstick 3+ (A) NEGATIVE   pH 6.0 5.0 - 8.0   Protein, ur 100 (A) NEGATIVE mg/dL   Nitrite NEGATIVE NEGATIVE   Leukocytes, UA TRACE (A) NEGATIVE   RBC / HPF TOO NUMEROUS TO COUNT 0 - 5 RBC/hpf   WBC, UA 6-30 0 - 5 WBC/hpf   Bacteria, UA RARE (A) NONE SEEN   Squamous Epithelial / LPF 0-5 (A) NONE SEEN   Mucous PRESENT    No results found for this or any previous visit (from the past 240 hour(s)). Creatinine:  Recent Labs  09/23/15 2305   CREATININE 1.20    Impression/Assessment/plan: Bilateral ureteral stones, bilateral hydronephrosis in patient with coronary artery disease with heart catheterization scheduled for Monday. I discussed the patient  with anesthesia, Dr. Yvone Neu and Dr. Marcille Blanco and we are in agreement patient will need ureteral stents prior to heart catheterization and contrast load. The risk of anesthesia/endoscopic stent placement are outweighed by the benefits in his situation to keep renal function optimized for cardiac catheterization. If we waited to drain the kidneys until after the cardiac catheterization, there may be more risk in placing the stents if pt were not stable and nephrostomy tubes would be contraindicated after anticoagulation. Also, iif there is failure to gain RG access he may need a nephrostomy tube. Should the patient need revascularization/cardiac stenting, he could undergo ureteroscopy on anticoagulation. I discussed with patient and his wife, the patient the nature, potential benefits, risks and alternatives to cystoscopy with bilateral RGP and ureteral stent placement, including side effects of the proposed treatment, the likelihood of the patient achieving the goals of the procedure, and any potential problems that might occur during the procedure or recuperation. We discussed risks of bleeding, infection, ureteral injury, stent pain among others. All questions answered. He elects to proceed.     [employer] 09/24/2015

## 2015-09-24 NOTE — Consult Note (Signed)
Cardiology Consultation   Patient ID: Willie Clark; [id number]; [birth year]   Admit date: 09/23/2015 Date of Consult: 09/24/2015  Referring MD:  Dr. Junious Silk, Urology  Patient Care Team: [name], MD as PCP - General (Internal Medicine)  Dr. Rosilyn Mings   Reason for Consultation: pre procedure evaluation, known CAD   History of Present Illness: Willie Clark is a 65 y.o. male who presents to ED with severe L flank pain and was found to have on CT:   There is minimal right-sided hydronephrosis and mild-to-moderate left-sided hydronephrosis. This reflects an obstructing 7 x 5 mm stone proximally, at the right ureteropelvic junction, and a large 1.1 x 0.8 cm stone at the proximal left ureter, just below the left renal pelvis.  He was evaluated by Urology for ureteral stents placement. Cardiology consult made to evaluate him prior to this procedure, knowing he is already scheduled outpatient for [facility] for abn stress test.   He has known history of coronary artery disease. He is status post coronary artery bypass graft surgery in 1993 and redo in 2007.He also has chronic systolic heart failure with mildly reduced LV systolic function. He is scheduled for LHC by Dr. Fletcher Anon on 09/25/2015 for abnormal stress test and CP.   Pharmacologic stress test from 08/21/2015:  There was no ST segment deviation noted during stress.  No T wave inversion was noted during stress.  Defect 1: There is a medium defect of moderate severity present in the basal anteroseptal and mid anteroseptal location. This is suggestive of prior infarct with mild peri-infarct ischemia.  Defect 2: There is a small defect present in the mid anterolateral location suggestive of mild ischemia.  This is an intermediate risk study.  The left ventricular ejection fraction is mildly decreased (45-54%).  He describes the CP as tightness, 3-8/10, lasts minutes at a time, chronic/he has been living with this CP for a long  time, sometimes radiating up to the jaw. He was placed on Imdur with some relief but developed intolerable headache therefore it was discontinued and LHC subsequently scheduled. Currently, he denies CP, SOB.  He was seen in early 2015 for recurrent episodes of substernal chest pain both at rest and with activities. He underwent a nuclear stress test which was abnormal and showed mild anterolateral ischemia with normal ejection fraction. Cardiac catheterization by Dr. Fletcher Anon in March of 2015 showed significant native three-vessel coronary artery disease with patent grafts. Diagonal branches are known to be occluded and not bypassed. There was 60% stenosis in the proximal left circumflex supplying OM 3 (OM 2 is bypassed). Pressure wire interrogation showed an FFR ratio of 0.85. No revascularization was performed.  He had neck surgery last year without complications.   ROS: Patient denies CP currently, SOB, palpitations, PND, orthopnea, leg swelling. He has chronic mild headaches for which he takes Tylenol everyday.   Past Medical History  Diagnosis Date  . Chronic chest pain   . Stroke ([place])   . Sleep apnea   . Depression   . Bowel obstruction ([place]) 02/2009    small bowel  . Pulmonary embolism ([place]) 01/2006    Post-op/treated  . Lightheadedness   . SOB (shortness of breath)   . Syncope and collapse   . Coronary artery disease 1993    MI   . Fibromyalgia   . Hypertension   . Hyperlipidemia   . MI (myocardial infarction) ([place])   . OSA (obstructive sleep apnea)   . Kidney stones   .  Barrett's esophagus   . Diverticulitis   . Urticaria   . Skin lesions, generalized   . Mass on back     Past Surgical History  Procedure Laterality Date  . Bowel obstruction  01/2009  . Shoulder surgery  2010  . Neck surgery  06/2009  . Coronary artery bypass graft  1993/01/2006    redo at [facility]. LIMA to LAD, SVG to OM2 and SVG to RPDA  . Back surgery    . Cardiac catheterization  11/2006    patent  grafts. Significant OM3 disease and occluded diagonals.  . Cardiac catheterization  06/2010    patent grafts. significant ISR in proximal LCX but OM2 is bypassed and gives retrograde flow to OM3.   . Cardiac catheterization  06/2013    [facility]: Patent grafts with 60% proximal in-stent restenosis in the left circumflex with FFR of 0.85  . Cholecystectomy    . Esophagogastroduodenoscopy N/A 10/25/2014    Procedure: ESOPHAGOGASTRODUODENOSCOPY (EGD);  Surgeon: Lollie Sails, MD;  Location: [facility] ENDOSCOPY;  Service: Endoscopy;  Laterality: N/A;      Home Meds: Prior to Admission medications   Medication Sig Start Date End Date Taking? Authorizing Provider  acetaminophen (TYLENOL) 500 MG tablet Take 500 mg by mouth every 8 (eight) hours as needed for mild pain.  06/09/12  Yes Historical Provider, MD  aspirin EC 81 MG tablet Take 81 mg by mouth daily.   Yes Historical Provider, MD  citalopram (CELEXA) 20 MG tablet Take 20 mg by mouth daily.    Yes Historical Provider, MD  enalapril (VASOTEC) 20 MG tablet Take 0.5 tablets (10 mg total) by mouth daily. 08/24/15  Yes Wellington Hampshire, MD  fexofenadine (ALLEGRA) 180 MG tablet Take 180 mg by mouth daily.   Yes Historical Provider, MD  FIBER PO Take 1 tablet by mouth daily.    Yes Historical Provider, MD  furosemide (LASIX) 40 MG tablet Take 1 tablet (40 mg total) by mouth daily as needed. Patient taking differently: Take 40 mg by mouth daily as needed for fluid.  11/25/11  Yes Wellington Hampshire, MD  gabapentin (NEURONTIN) 800 MG tablet Take 800 mg by mouth 3 (three) times daily.   Yes Historical Provider, MD  metoprolol succinate (TOPROL-XL) 25 MG 24 hr tablet Take 25 mg by mouth daily.   Yes Historical Provider, MD  Multiple Vitamins-Minerals (HAIR/SKIN/NAILS PO) Take 1 tablet by mouth 3 (three) times daily.    Yes Historical Provider, MD  nitroGLYCERIN (NITROSTAT) 0.4 MG SL tablet Place 0.4 mg under the tongue every 5 (five) minutes as needed for chest pain.     Yes Historical Provider, MD  nortriptyline (PAMELOR) 25 MG capsule Take 25 mg by mouth daily.  07/25/14  Yes Historical Provider, MD  pantoprazole (PROTONIX) 40 MG tablet Take 40 mg by mouth 2 (two) times daily.    Yes Historical Provider, MD  potassium chloride SA (K-DUR,KLOR-CON) 20 MEQ tablet Take 20 mEq by mouth daily.   Yes Historical Provider, MD  rosuvastatin (CRESTOR) 20 MG tablet Take 20 mg by mouth daily.     Yes Historical Provider, MD  Simethicone (GAS-X PO) Take 1 tablet by mouth as needed (gas).    Yes Historical Provider, MD  sucralfate (CARAFATE) 1 G tablet Take by mouth 4 (four) times daily.  06/09/12  Yes Historical Provider, MD  tamsulosin (FLOMAX) 0.4 MG CAPS capsule Take 0.4 mg by mouth daily.   Yes Historical Provider, MD  topiramate (TOPAMAX) 50 MG  tablet Take 50 mg by mouth 2 (two) times daily.  02/09/13  Yes Historical Provider, MD  vitamin B-12 (CYANOCOBALAMIN) 1000 MCG tablet Take 1,000 mcg by mouth daily.   Yes Historical Provider, MD    Current Medications: . [MAR Hold]  ceFAZolin (ANCEF) IV  2 g Intravenous On Call to OR     Allergies:    Allergies  Allergen Reactions  . [employer]   . Fish Oil Itching  . Lidocaine Nausea And Vomiting  . Other Diarrhea    Reactive agents: Onions, bacon    Social History:   The patient  reports that he quit smoking about 24 years ago. His smoking use included Cigarettes. He smoked 2.50 packs per day. He does not have any smokeless tobacco history on file. He reports that he drinks alcohol. He reports that he does not use illicit drugs.    Family History:   The patient's family history includes Heart attack in his father; Heart disease (age of onset: 58) in his father.   ROS:  Please see the history of present illness.  ROS All other ROS reviewed and negative.      PHYSICAL EXAM: VS:  BP 137/88 mmHg  Pulse 77  Temp(Src) 98.4 F (36.9 C) (Oral)  Resp 14  Wt 190 lb (86.183 kg)  SpO2 97% , BMI Body mass index is  27.67 kg/(m^2). GENERAL:  well developed, well nourished, obese, not in acute distress HEENT: normocephalic, pink conjunctivae, anicteric sclerae, no xanthelasma, normal dentition, oropharynx clear NECK:  no neck vein engorgement, JVP normal, no hepatojugular reflux, carotid upstroke brisk and symmetric, no bruit, no thyromegaly, no lymphadenopathy LUNGS:  good respiratory effort, clear to auscultation bilaterally CV:  PMI not displaced, no thrills, no lifts, S1 and S2 within normal limits, no palpable S3 or S4, no murmurs, no rubs, no gallops ABD:  Soft, nontender, nondistended, normoactive bowel sounds, no abdominal aortic bruit, no hepatomegaly, no splenomegaly MS: nontender back, no kyphosis, no scoliosis, no joint deformities EXT:  2+ DP/PT pulses, no edema, no varicosities, no cyanosis, no clubbing SKIN: warm, nondiaphoretic, normal turgor, no ulcers NEUROPSYCH: alert, oriented to person, place, and time, sensory/motor grossly intact, normal mood, appropriate affect   EKG: The EKG from 09/23/2015 was personally reviewed by me and it showed SR 67 bpm, borderline right axis deviation, nonspecific STT  Wave changes  Labs:  Recent Labs  09/22/15 2335  TROPONINI <0.03   Lab Results  Component Value Date   WBC 9.3 09/23/2015   HGB 15.0 09/23/2015   HCT 44.4 09/23/2015   MCV 96.0 09/23/2015   PLT 173 09/23/2015    Recent Labs Lab 09/23/15 2305  NA 139  K 3.6  CL 104  CO2 26  BUN 12  CREATININE 1.20  CALCIUM 9.3  GLUCOSE 107*   No results found for: CHOL, HDL, LDLCALC, TRIG No results found for: DDIMER  Radiology/Studies:  Dg Chest 2 View  09/14/2015  CLINICAL DATA:  Preop cardiac catheterization. EXAM: CHEST  2 VIEW COMPARISON:  06/23/2013 FINDINGS: Lungs adequately inflated without consolidation or effusion. Minimal linear scarring over the left base. Cardiomediastinal silhouette is within normal. Mild degenerate change of the spine. Previous median sternotomy. Surgical  anchors of the left humeral head. Fusion hardware over the cervical thoracic junction. IMPRESSION: No acute cardiopulmonary disease. Electronically Signed   By: Marin Olp M.D.   On: 09/14/2015 20:27   Ct Renal Stone Study  09/24/2015  CLINICAL DATA:  Acute onset of left-sided  abdominal and pelvic pain. Nausea. Initial encounter. EXAM: CT ABDOMEN AND PELVIS WITHOUT CONTRAST TECHNIQUE: Multidetector CT imaging of the abdomen and pelvis was performed following the standard protocol without IV contrast. COMPARISON:  CT of the abdomen and pelvis from 10/18/2014, and MRI of the lumbar spine performed 04/18/2015 FINDINGS: Minimal bibasilar atelectasis noted. Scattered coronary artery calcifications are seen. The liver and spleen are unremarkable in appearance. The patient is status post cholecystectomy, with clips noted at the gallbladder fossa. The pancreas and adrenal glands are unremarkable. There is minimal right-sided hydronephrosis and mild-to-moderate left-sided hydronephrosis. This reflects an obstructing 7 x 5 mm stone proximally, at the right ureteropelvic junction, and a large 1.1 x 0.8 cm stone at the proximal left ureter, just below the left renal pelvis. Left-sided perinephric stranding is noted. Minimal right-sided perinephric stranding is also seen. Nonobstructing bilateral renal stones measure up to 4 mm in size. No free fluid is identified. The small bowel is unremarkable in appearance. The stomach is within normal limits. No acute vascular abnormalities are seen. Scattered calcification is seen along the abdominal aorta and its branches. The appendix is normal in caliber, without evidence of appendicitis. Scattered diverticulosis is noted along the descending and sigmoid colon, without evidence of diverticulitis. The bladder is mildly distended and grossly unremarkable. The prostate is normal in size. No inguinal lymphadenopathy is seen. No acute osseous abnormalities are identified. IMPRESSION: 1.  Bilateral ureteral obstruction noted, raising concern for impending renal failure. Urgent intervention is necessary. 2. Minimal right-sided hydronephrosis and mild-to-moderate left-sided hydronephrosis. This reflects an obstructing 7 x 5 mm stone proximally at the right ureteropelvic junction, and a large 1.1 x 0.8 cm stone at the proximal left ureter, just below the left renal pelvis. 3. Nonobstructing bilateral renal stones measure up to 4 mm in size. 4. Scattered coronary artery calcifications seen. 5. Scattered calcification along the abdominal aorta and its branches. 6. Scattered diverticulosis along the descending and sigmoid colon, without evidence of diverticulitis. These results were called by telephone at the time of interpretation on 09/24/2015 at 1:11 am to Dr. Lurline Hare, who verbally acknowledged these results. Electronically Signed   By: Garald Balding M.D.   On: 09/24/2015 01:16     PROBLEM LIST:  Active Problems:   Obstructive uropathy  CAD s/p CABG Abnormal stress test HTN Hyperlipidemia   ASSESSMENT AND PLAN: Pre procedure cardiac evalaution Coronary artery disease s/p CABG 1993, redo CABG 2007 LHC 06/2013: significant native three-vessel coronary artery disease with patent grafts. Diagonal branches are known to be occluded and not bypassed. There was 60% stenosis in the proximal left circumflex supplying OM 3 (OM 2 is bypassed). Pressure wire interrogation showed an FFR ratio of 0.85. No revascularization was performed.  Presenting with stable angina, found to have abnormal Pharmacologic stress test from 08/21/2015:  There was no ST segment deviation noted during stress.  No T wave inversion was noted during stress.  Defect 1: There is a medium defect of moderate severity present in the basal anteroseptal and mid anteroseptal location. This is suggestive of prior infarct with mild peri-infarct ischemia.  Defect 2: There is a small defect present in the mid anterolateral  location suggestive of mild ischemia.  This is an intermediate risk study.              The left ventricular ejection fraction is mildly decreased (45-54%).  Patient is scheduled for left heart catheterization 09/25/2015 by Dr. Fletcher Anon. This was previously scheduled due to abnormal stress test/intermediate  risk study, persistence of chest pain, and intolerance to isosorbide mononitrate. Patient currently has no chest pain. No ischemic EKG changes on EKG. Troponin level is negative. Patient is intermediate to high cardiac risk prior to planned urgent/emergent urologic procedure/ureteral stent placement for bilaterally obstructing ureteral stones. It would be deemed prudent to address the urologic/renal issues prior to use of IV contrast during left heart catheterization. Case discussed with Dr. Junious Silk. Recommend to continue ASA 81mg  po qd and give dose prior to procedure. Patient reports that he had stopped taking aspirin last Friday, 09/22/2015. He was under the impression that he should stop it before a planned left heart catheterization. He stopped it on his own. Recommend to continue medical therapy with Toprol XL, and Crestor. Resume enalapril and furosemide when appropriate. I had a lengthy and detailed discussion with the patient and family regarding his current clinical condition. He understands risks and benefits of proceeding with urologic procedure prior to [facility].   CHF, mild systolic/diastolic dysfunction, chronic No evidence of acute decompensated congestive heart failure. Continue medical therapy.  Essential hypertension Blood pressure is well controlled on current medications.  Hyperlipidemia Continue current dose of rosuvastatin with a target LDL of less than 70.  This patient evaluation involved a high level of risk in terms of medical decision making (urgent/emergent surgery, known CAD with abnormal stress test, scheduled for [facility]).   Signed, Wende Bushy, MD  09/24/2015 5:32  AM

## 2015-09-24 NOTE — Progress Notes (Signed)
Cefazolin consult:  Cefazolin 2 grams x1 on call to OR ordered.

## 2015-09-24 NOTE — Op Note (Signed)
Preoperative diagnosis: Bilateral ureteral stones, bilateral hydronephrosis Postoperative diagnosis: Same  Procedure: Cystoscopy with bilateral retrograde pyelogram and bilateral ureteral stent placement  Surgeon: Junious Silk  Anesthesia: Gen.  Indications for procedure: 65 year old white male with planned heart cath tomorrow who presented with acute left flank pain and bilateral obstructing ureteral stones. It was felt prudent to proceed with ureteral stenting prior to heart catheterization.  Findings: On exam under anesthesia the penis appeared circumcised and without mass or lesion. On digital rectal exam the prostate was small and benign without hard area or nodule.  Left retrograde pyelogram-this outlined a single ureter single collecting system unit with a large filling defect in the proximal ureter consistent with the stone and dilation of the collecting system proximally.  Right retrograde pyelogram-this outlined a single ureter single collecting system unit with a smaller filling defect at the UPJ and more mild dilation of the collecting system proximally.  Description of procedure: After consent was obtained patient brought to the operating room. After adequate anesthesia he is placed in lithotomy position. An exam under anesthesia was performed. He was prepped and draped in usual sterile fashion. A timeout was performed to confirm the patient and procedure. The cystoscope was passed per urethra and the prostate was short and nonobstructing but he did have a rather high and tight bladder neck. The bladder was unremarkable without stone or foreign body. The mucosa appeared normal. The left ureteral orifice was cannulated with a 5 [place] open-ended catheter and retrograde injection of contrast was performed. Not a lot of contrast went proximal to the stone so advance the 5 French catheter past the stone and remove the wire. There was a good hydronephrotic drip and another retrograde confirmed  proper placement of the wire in the collecting system. The 5 [place] open-ended catheter went past the stone about a centimeter but I could advance it no further. I thought a glide wire might allow easier passage so I exchanged the sensor for a glide but the Glidewire was actually smaller diameter and did not allow catheter passage. Therefore the sensor wire was replaced and coiled in the upper pole collecting system. The 5 [place] open-ended catheter was removed and a 6 [place] by 26 cm ureteral stent was advanced over the wire. The wire was removed with a good coil seen in the collecting system and a good coil in the bladder. It was tight but the 6 French stent slid past the stone and into the collecting system.  Attention was turned to the right side in the right ureteral orifice cannulated with the 5 [place] open-ended catheter and retrograde injection of contrast was performed. This time contrast went past the stone outlining the collecting system. A sensor wire was advanced and coiled in the collecting system. A 6 x 26 cm ureteral stent was advanced and the wire removed with a good coil seen in the collecting system and a good coil in the bladder. The urine emanating from the right side appeared more debris-filled possibly purulent and therefore some of this was collected and sent for culture. The scope was removed and I placed an 30 [place] coud catheter for wake up and a max drain the system. He was awakened and taken to the recovery room in stable condition.  Complications: None  Blood loss: Minimal  Specimens: Urine for culture  Drains: Bilateral 6 x 26 cm ureteral stents, 18 French Foley catheter

## 2015-09-24 NOTE — Anesthesia Procedure Notes (Signed)
Procedure Name: LMA Insertion Date/Time: 09/24/2015 5:20 AM Performed by: Eliberto Ivory Pre-anesthesia Checklist: Patient identified, Patient being monitored, Timeout performed, Emergency Drugs available and Suction available Patient Re-evaluated:Patient Re-evaluated prior to inductionOxygen Delivery Method: Circle system utilized Preoxygenation: Pre-oxygenation with 100% oxygen Intubation Type: IV induction Ventilation: Mask ventilation without difficulty LMA: LMA inserted LMA Size: 4.0 Tube type: Oral Number of attempts: 1 Placement Confirmation: positive ETCO2 and breath sounds checked- equal and bilateral Tube secured with: Tape Dental Injury: Teeth and Oropharynx as per pre-operative assessment

## 2015-09-24 NOTE — Progress Notes (Signed)
Patient admitted earlier this morning feeling better  This is a 65 year old male admitted for nephrolithiasis and hydronephrosis. 1. Nephrolithiasis: Bilateral hydronephrosis status post stent 2. Chest pain: Likely secondary to stress and demand ischemia. Appreciate cardiology input  3. Essential hypertension: Continue ACE inhibitor and metoprolol 4. Hyperlipidemia: Continue statin therapy 5. Erosive esophagitis: Continue sucralfate and 6. BPH: Continue tamsulosin 7. Depression: Continue Celexa  8. Fibromyalgia: Continue gabapentin, Topamax and nortriptyline 9. DVT prophylaxis: SCDs (may start pharmacologic prophylaxis when cleared postoperatively) 10. GI prophylaxis: Pantoprazole per home regimen

## 2015-09-25 ENCOUNTER — Encounter: Payer: Self-pay | Admitting: Urology

## 2015-09-25 ENCOUNTER — Telehealth: Payer: Self-pay

## 2015-09-25 ENCOUNTER — Ambulatory Visit: Admission: RE | Admit: 2015-09-25 | Payer: Medicare HMO | Source: Ambulatory Visit | Admitting: Cardiovascular Disease

## 2015-09-25 ENCOUNTER — Encounter
Admission: EM | Disposition: A | Discharge status: Home / Self Care | Payer: Self-pay | Source: Home / Self Care | Attending: Internal Medicine

## 2015-09-25 DIAGNOSIS — N200 Calculus of kidney: Secondary | ICD-10-CM | POA: Insufficient documentation

## 2015-09-25 DIAGNOSIS — N139 Obstructive and reflux uropathy, unspecified: Secondary | ICD-10-CM

## 2015-09-25 DIAGNOSIS — I25119 Atherosclerotic heart disease of native coronary artery with unspecified angina pectoris: Secondary | ICD-10-CM

## 2015-09-25 DIAGNOSIS — N135 Crossing vessel and stricture of ureter without hydronephrosis: Secondary | ICD-10-CM | POA: Insufficient documentation

## 2015-09-25 HISTORY — PX: CARDIAC CATHETERIZATION: SHX172

## 2015-09-25 LAB — URINE CULTURE: CULTURE: NO GROWTH

## 2015-09-25 SURGERY — LEFT HEART CATH AND CORS/GRAFTS ANGIOGRAPHY
Anesthesia: Moderate Sedation | Laterality: Left

## 2015-09-25 MED ORDER — HEPARIN SODIUM (PORCINE) 1000 UNIT/ML IJ SOLN
INTRAMUSCULAR | Status: AC
  Filled 2015-09-25: qty 1

## 2015-09-25 MED ORDER — SODIUM CHLORIDE 0.9 % IV SOLN: INTRAVENOUS | Status: DC

## 2015-09-25 MED ORDER — SODIUM CHLORIDE 0.9% FLUSH: 3.0000 mL | INTRAVENOUS | Status: DC | PRN

## 2015-09-25 MED ORDER — SODIUM CHLORIDE 0.9 % IV SOLN: 250.0000 mL | INTRAVENOUS | Status: DC | PRN

## 2015-09-25 MED ORDER — HEPARIN SODIUM (PORCINE) 1000 UNIT/ML IJ SOLN
INTRAMUSCULAR | Status: DC | PRN
  Administered 2015-09-25: 4500 [IU] via INTRAVENOUS

## 2015-09-25 MED ORDER — HEPARIN (PORCINE) IN NACL 2-0.9 UNIT/ML-% IJ SOLN
INTRAMUSCULAR | Status: AC
  Filled 2015-09-25: qty 500

## 2015-09-25 MED ORDER — ASPIRIN 81 MG PO CHEW: 81.0000 mg | CHEWABLE_TABLET | ORAL | Status: DC

## 2015-09-25 MED ORDER — SODIUM CHLORIDE 0.9 % IV SOLN
INTRAVENOUS | Status: AC
Start: 2015-09-25 — End: 2015-09-25
  Administered 2015-09-25: 17:00:00 via INTRAVENOUS

## 2015-09-25 MED ORDER — VERAPAMIL HCL 2.5 MG/ML IV SOLN
INTRAVENOUS | Status: AC
  Filled 2015-09-25: qty 2

## 2015-09-25 MED ORDER — SODIUM CHLORIDE 0.9% FLUSH: 3.0000 mL | Freq: Two times a day (BID) | INTRAVENOUS | Status: DC

## 2015-09-25 MED ORDER — FENTANYL CITRATE (PF) 100 MCG/2ML IJ SOLN
INTRAMUSCULAR | Status: AC
  Filled 2015-09-25: qty 2

## 2015-09-25 MED ORDER — MIDAZOLAM HCL 2 MG/2ML IJ SOLN
INTRAMUSCULAR | Status: AC
  Filled 2015-09-25: qty 2

## 2015-09-25 MED ORDER — IOPAMIDOL (ISOVUE-300) INJECTION 61%
INTRAVENOUS | Status: DC | PRN
  Administered 2015-09-25: 75 mL via INTRAVENOUS

## 2015-09-25 MED ORDER — MIDAZOLAM HCL 2 MG/2ML IJ SOLN
INTRAMUSCULAR | Status: DC | PRN
  Administered 2015-09-25: 1 mg via INTRAVENOUS

## 2015-09-25 MED ORDER — FENTANYL CITRATE (PF) 100 MCG/2ML IJ SOLN
INTRAMUSCULAR | Status: DC | PRN
  Administered 2015-09-25: 25 ug via INTRAVENOUS

## 2015-09-25 SURGICAL SUPPLY — 7 items
CATH 5F 110X4 TIG (CATHETERS) ×3 IMPLANT
CATH INFINITI JR4 5F (CATHETERS) ×3 IMPLANT
DEVICE RAD TR BAND REGULAR (VASCULAR PRODUCTS) ×3 IMPLANT
GLIDESHEATH SLEND SS 6F .021 (SHEATH) ×3 IMPLANT
KIT MANI 3VAL PERCEP (MISCELLANEOUS) ×3 IMPLANT
PACK CARDIAC CATH (CUSTOM PROCEDURE TRAY) ×3 IMPLANT
WIRE SAFE-T 1.5MM-J .035X260CM (WIRE) ×3 IMPLANT

## 2015-09-25 NOTE — H&P (View-Only) (Signed)
   SUBJECTIVE:  The patient is scheduled for left heart catheterization today which was arranged as an outpatient due to chest pain and abnormal stress test. He was hospitalized with symptomatic kidney stone and had ureteral stent placement without complications. Renal function is normal.   Filed Vitals:   09/24/15 1148 09/24/15 2021 09/25/15 0409 09/25/15 0500  BP: 132/78 114/68 115/74   Pulse: 72 62 58   Temp: 98.1 F (36.7 C) 98.4 F (36.9 C) 97.7 F (36.5 C)   TempSrc: Oral Oral Oral   Resp: 18 20 18    Height:      Weight:    192 lb 6.4 oz (87.272 kg)  SpO2: 97% 99% 97%     Intake/Output Summary (Last 24 hours) at 09/25/15 0844 Last data filed at 09/25/15 0400  Gross per 24 hour  Intake   1285 ml  Output   5000 ml  Net  -3715 ml    LABS: Basic Metabolic Panel:  Recent Labs  09/23/15 2305  NA 139  K 3.6  CL 104  CO2 26  GLUCOSE 107*  BUN 12  CREATININE 1.20  CALCIUM 9.3   Liver Function Tests: No results for input(s): AST, ALT, ALKPHOS, BILITOT, PROT, ALBUMIN in the last 72 hours.  Recent Labs  09/23/15 2305  LIPASE 44   CBC:  Recent Labs  09/23/15 2305  WBC 9.3  NEUTROABS 6.4  HGB 15.0  HCT 44.4  MCV 96.0  PLT 173   Cardiac Enzymes:  Recent Labs  09/24/15 0817 09/24/15 1336 09/24/15 1936  TROPONINI <0.03 <0.03 <0.03   BNP: Invalid input(s): POCBNP D-Dimer: No results for input(s): DDIMER in the last 72 hours. Hemoglobin A1C:  Recent Labs  09/24/15 0817  HGBA1C 5.6   Fasting Lipid Panel: No results for input(s): CHOL, HDL, LDLCALC, TRIG, CHOLHDL, LDLDIRECT in the last 72 hours. Thyroid Function Tests:  Recent Labs  09/24/15 0817  TSH 4.033   Anemia Panel: No results for input(s): VITAMINB12, FOLATE, FERRITIN, TIBC, IRON, RETICCTPCT in the last 72 hours.   PHYSICAL EXAM General: Well developed, well nourished, in no acute distress HEENT:  Normocephalic and atramatic Neck:  No JVD.  Lungs: Clear bilaterally to  auscultation and percussion. Heart: HRRR . Normal S1 and S2 without gallops or murmurs.  Abdomen: Bowel sounds are positive, abdomen soft and non-tender  Msk:  Back normal, normal gait. Normal strength and tone for age. Extremities: No clubbing, cyanosis or edema.   Neuro: Alert and oriented X 3. Psych:  Good affect, responds appropriately  TELEMETRY: Reviewed telemetry pt in Normal sinus rhythm:  ASSESSMENT AND PLAN:  1. 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 The patient scheduled for left heart catheterization and graft angiography today with possible PCI. I gave him the option of postponing this given his situation with symptomatic kidney stone but he prefers to proceed today given his continued cardiac symptoms.  2. Essential hypertension: Blood pressure is controlled on current medications.  3. Chronic diastolic heart failure: No evidence of volume overload.  [name], MD, [facility] 09/25/2015 8:44 AM

## 2015-09-25 NOTE — Progress Notes (Signed)
DR PATEL S, WAS INFORMED OF PT'S STILL BLOODY URINE AFTER FOLEY CATH REMOVAL AND CATH SITE , DR ARIDA AT BEDSIDE EVALUATING CATH SITE STATED IT OK AND PT'S CAN GO HOME AS ORDER ON COMPLETION OF IV FLUID NOW INFUSING , WILL CONTINUE TO MONITOR.

## 2015-09-25 NOTE — Telephone Encounter (Signed)
-----   Message from Festus Aloe, MD sent at 09/24/2015  6:38 AM EDT ----- Pt s/p Cystoscopy with bilateral retrograde pyelogram and bilateral ureteral stent placement -- he'll need OV in about 2 weeks to plan bilateral ureteroscopy, laser lithotripsy.

## 2015-09-25 NOTE — Care Management (Signed)
No discharge needs identified by the care team or CM, family

## 2015-09-25 NOTE — Progress Notes (Signed)
   SUBJECTIVE:  The patient is scheduled for left heart catheterization today which was arranged as an outpatient due to chest pain and abnormal stress test. He was hospitalized with symptomatic kidney stone and had ureteral stent placement without complications. Renal function is normal.   Filed Vitals:   09/24/15 1148 09/24/15 2021 09/25/15 0409 09/25/15 0500  BP: 132/78 114/68 115/74   Pulse: 72 62 58   Temp: 98.1 F (36.7 C) 98.4 F (36.9 C) 97.7 F (36.5 C)   TempSrc: Oral Oral Oral   Resp: 18 20 18    Height:      Weight:    192 lb 6.4 oz (87.272 kg)  SpO2: 97% 99% 97%     Intake/Output Summary (Last 24 hours) at 09/25/15 0844 Last data filed at 09/25/15 0400  Gross per 24 hour  Intake   1285 ml  Output   5000 ml  Net  -3715 ml    LABS: Basic Metabolic Panel:  Recent Labs  09/23/15 2305  NA 139  K 3.6  CL 104  CO2 26  GLUCOSE 107*  BUN 12  CREATININE 1.20  CALCIUM 9.3   Liver Function Tests: No results for input(s): AST, ALT, ALKPHOS, BILITOT, PROT, ALBUMIN in the last 72 hours.  Recent Labs  09/23/15 2305  LIPASE 44   CBC:  Recent Labs  09/23/15 2305  WBC 9.3  NEUTROABS 6.4  HGB 15.0  HCT 44.4  MCV 96.0  PLT 173   Cardiac Enzymes:  Recent Labs  09/24/15 0817 09/24/15 1336 09/24/15 1936  TROPONINI <0.03 <0.03 <0.03   BNP: Invalid input(s): POCBNP D-Dimer: No results for input(s): DDIMER in the last 72 hours. Hemoglobin A1C:  Recent Labs  09/24/15 0817  HGBA1C 5.6   Fasting Lipid Panel: No results for input(s): CHOL, HDL, LDLCALC, TRIG, CHOLHDL, LDLDIRECT in the last 72 hours. Thyroid Function Tests:  Recent Labs  09/24/15 0817  TSH 4.033   Anemia Panel: No results for input(s): VITAMINB12, FOLATE, FERRITIN, TIBC, IRON, RETICCTPCT in the last 72 hours.   PHYSICAL EXAM General: Well developed, well nourished, in no acute distress HEENT:  Normocephalic and atramatic Neck:  No JVD.  Lungs: Clear bilaterally to  auscultation and percussion. Heart: HRRR . Normal S1 and S2 without gallops or murmurs.  Abdomen: Bowel sounds are positive, abdomen soft and non-tender  Msk:  Back normal, normal gait. Normal strength and tone for age. Extremities: No clubbing, cyanosis or edema.   Neuro: Alert and oriented X 3. Psych:  Good affect, responds appropriately  TELEMETRY: Reviewed telemetry pt in Normal sinus rhythm:  ASSESSMENT AND PLAN:  1. 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 The patient scheduled for left heart catheterization and graft angiography today with possible PCI. I gave him the option of postponing this given his situation with symptomatic kidney stone but he prefers to proceed today given his continued cardiac symptoms.  2. Essential hypertension: Blood pressure is controlled on current medications.  3. Chronic diastolic heart failure: No evidence of volume overload.  [name], MD, [facility] 09/25/2015 8:44 AM

## 2015-09-25 NOTE — Care Management Important Message (Addendum)
Important Message  Patient Details  Name: Willie Clark MRN: [id number] Date of Birth: 1950-08-14   Medicare Important Message Given:  Yes this notice was given to patient and wife around 8:40 AM.  Documented late- patient went off unit and not on unit census so was forgotten   Katrina Stack, RN 09/25/2015, 5:07 PM

## 2015-09-25 NOTE — Interval H&P Note (Signed)
Cath Lab Visit (complete for each Cath Lab visit)  Clinical Evaluation Leading to the Procedure:   ACS: No.  Non-ACS:    Anginal Classification: CCS III  Anti-ischemic medical therapy: Maximal Therapy (2 or more classes of medications)  Non-Invasive Test Results: Intermediate-risk stress test findings: cardiac mortality 1-3%/year  Prior CABG: Previous CABG      History and Physical Interval Note:  09/25/2015 1:02 PM  Willie Clark  has presented today for surgery, with the diagnosis of Abnormal stress test     LT cath w graft per Ivin Booty Dr Fletcher Anon office:bd     The various methods of treatment have been discussed with the patient and family. After consideration of risks, benefits and other options for treatment, the patient has consented to  Procedure(s): Left Heart Cath and Cors/Grafts Angiography (Left) as a surgical intervention .  The patient's history has been reviewed, patient examined, no change in status, stable for surgery.  I have reviewed the patient's chart and labs.  Questions were answered to the patient's satisfaction.     [name]

## 2015-09-25 NOTE — Discharge Instructions (Signed)
Ureteral Stent Implantation, Care After Refer to this sheet in the next few weeks. These instructions provide you with information on caring for yourself after your procedure. Your health care provider may also give you more specific instructions. Your treatment has been planned according to current medical practices, but problems sometimes occur. Call your health care provider if you have any problems or questions after your procedure. WHAT TO EXPECT AFTER THE PROCEDURE You should be back to normal activity within 48 hours after the procedure. Nausea and vomiting may occur and are commonly the result of anesthesia. It is common to experience sharp pain in the back or lower abdomen and penis with voiding. This is caused by movement of the ends of the stent with the act of urinating.It usually goes away within minutes after you have stopped urinating. HOME CARE INSTRUCTIONS Make sure to drink plenty of fluids. You may have small amounts of bleeding, causing your urine to be red. This is normal. Certain movements may trigger pain or a feeling that you need to urinate. You may be given medicines to prevent infection or bladder spasms. Be sure to take all medicines as directed. Only take over-the-counter or prescription medicines for pain, discomfort, or fever as directed by your health care provider. Do not take aspirin, as this can make bleeding worse.  Your stent will be left in until the blockage is resolved. This may take 2 weeks or longer. Be sure to keep all follow-up appointments so your health care provider can check that you are healing properly.  SEEK MEDICAL CARE IF:  You experience increasing pain.  Your pain medicine is not working. SEEK IMMEDIATE MEDICAL CARE IF:  Your urine is dark red or has blood clots.  You are leaking urine (incontinent).  You have a fever, chills, feeling sick to your stomach (nausea), or vomiting.  Your pain is not relieved by pain medicine.  The end of  the stent comes out of the urethra.  You are unable to urinate.   This information is not intended to replace advice given to you by your health care provider. Make sure you discuss any questions you have with your health care provider.   Document Released: 12/09/2012 Document Revised: 04/13/2013 Document Reviewed: 10/21/2014 Elsevier Interactive Patient Education 2016 [employer].   DIET:  Cardiac diet  DISCHARGE CONDITION:  Stable  ACTIVITY:  Activity as tolerated  OXYGEN:  Home Oxygen: No.   Oxygen Delivery: room air  DISCHARGE LOCATION:  home    ADDITIONAL DISCHARGE INSTRUCTION:   If you experience worsening of your admission symptoms, develop shortness of breath, life threatening emergency, suicidal or homicidal thoughts you must seek medical attention immediately by calling 911 or calling your MD immediately  if symptoms less severe.  You Must read complete instructions/literature along with all the possible adverse reactions/side effects for all the Medicines you take and that have been prescribed to you. Take any new Medicines after you have completely understood and accpet all the possible adverse reactions/side effects.   Please note  You were cared for by a hospitalist during your hospital stay. If you have any questions about your discharge medications or the care you received while you were in the hospital after you are discharged, you can call the unit and asked to speak with the hospitalist on call if the hospitalist that took care of you is not available. Once you are discharged, your primary care physician will handle any further medical issues. Please note that NO  REFILLS for any discharge medications will be authorized once you are discharged, as it is imperative that you return to your primary care physician (or establish a relationship with a primary care physician if you do not have one) for your aftercare needs so that they can reassess your need for  medications and monitor your lab values.

## 2015-09-25 NOTE — Progress Notes (Signed)
Urology Consult Follow Up  Subjective: Patient complaining of stent discomfort this am.  Foley in place, draining red tinged urine.  UOP good.  Afebrile.  VSS, slight bradycardia.  Cardiac stent planned for today.  Urine culture is pending.  Anti-infectives: Anti-infectives    Start     Dose/Rate Route Frequency Ordered Stop   09/24/15 0600  [MAR Hold]  ceFAZolin (ANCEF) IVPB 2g/100 mL premix     (MAR Hold since 09/24/15 0531)   2 g 200 mL/hr over 30 Minutes Intravenous On call to O.R. 09/24/15 0328 09/24/15 0522      Current Facility-Administered Medications  Medication Dose Route Frequency Provider Last Rate Last Dose  . 0.9 %  sodium chloride infusion   Intravenous Continuous Harrie Foreman, MD 75 mL/hr at 09/24/15 2128    . acetaminophen (TYLENOL) tablet 650 mg  650 mg Oral Q6H PRN Harrie Foreman, MD   650 mg at 09/24/15 2319   Or  . acetaminophen (TYLENOL) suppository 650 mg  650 mg Rectal Q6H PRN Harrie Foreman, MD      . aspirin EC tablet 81 mg  81 mg Oral Daily Harrie Foreman, MD   81 mg at 09/24/15 0959  . citalopram (CELEXA) tablet 20 mg  20 mg Oral Daily Harrie Foreman, MD   20 mg at 09/24/15 0959  . docusate sodium (COLACE) capsule 100 mg  100 mg Oral BID Harrie Foreman, MD   100 mg at 09/24/15 2128  . enalapril (VASOTEC) tablet 10 mg  10 mg Oral Daily Harrie Foreman, MD   10 mg at 09/24/15 1000  . furosemide (LASIX) tablet 40 mg  40 mg Oral Daily PRN Harrie Foreman, MD      . gabapentin (NEURONTIN) capsule 800 mg  800 mg Oral TID Harrie Foreman, MD   800 mg at 09/24/15 2128  . HYDROmorphone (DILAUDID) injection 1 mg  1 mg Intravenous Q3H PRN Harrie Foreman, MD      . loratadine [facility]) tablet 10 mg  10 mg Oral Daily Harrie Foreman, MD   10 mg at 09/24/15 0959  . metoprolol succinate (TOPROL-XL) 24 hr tablet 25 mg  25 mg Oral Daily Harrie Foreman, MD   25 mg at 09/24/15 0959  . nitroGLYCERIN (NITROSTAT) SL tablet 0.4 mg  0.4 mg  Sublingual Q5 min PRN Harrie Foreman, MD      . nortriptyline (PAMELOR) capsule 25 mg  25 mg Oral Daily Harrie Foreman, MD   25 mg at 09/24/15 0959  . ondansetron (ZOFRAN) tablet 4 mg  4 mg Oral Q6H PRN Harrie Foreman, MD       Or  . ondansetron [facility]) injection 4 mg  4 mg Intravenous Q6H PRN Harrie Foreman, MD      . pantoprazole (PROTONIX) EC tablet 40 mg  40 mg Oral BID Harrie Foreman, MD   40 mg at 09/24/15 2128  . polycarbophil (FIBERCON) tablet 625 mg  625 mg Oral Daily Harrie Foreman, MD   625 mg at 09/24/15 0959  . potassium chloride SA (K-DUR,KLOR-CON) CR tablet 20 mEq  20 mEq Oral Daily Harrie Foreman, MD   20 mEq at 09/24/15 0959  . rosuvastatin (CRESTOR) tablet 20 mg  20 mg Oral Daily Harrie Foreman, MD   20 mg at 09/24/15 0959  . sodium chloride flush (NS) 0.9 % injection 3 mL  3 mL Intravenous Q12H Norva Riffle  Marcille Blanco, MD   3 mL at 09/24/15 1000  . sucralfate (CARAFATE) tablet 1 g  1 g Oral QID Harrie Foreman, MD   1 g at 09/24/15 2128  . tamsulosin (FLOMAX) capsule 0.4 mg  0.4 mg Oral Daily Harrie Foreman, MD   0.4 mg at 09/24/15 0959  . topiramate (TOPAMAX) tablet 50 mg  50 mg Oral BID Harrie Foreman, MD   50 mg at 09/24/15 2128  . vitamin B-12 (CYANOCOBALAMIN) tablet 1,000 mcg  1,000 mcg Oral Daily Harrie Foreman, MD   1,000 mcg at 09/24/15 [id number]     Objective: Vital signs in last 24 hours: Temp:  [97 F (36.1 C)-98.4 F (36.9 C)] 97.7 F (36.5 C) (06/05 0409) Pulse Rate:  [58-81] 58 (06/05 0409) Resp:  [11-20] 18 (06/05 0409) BP: (114-140)/(68-98) 115/74 mmHg (06/05 0409) SpO2:  [95 %-100 %] 97 % (06/05 0409) Weight:  [192 lb 6.4 oz (87.272 kg)-193 lb 3.2 oz (87.635 kg)] 192 lb 6.4 oz (87.272 kg) (06/05 0500)  Intake/Output from previous day: 06/04 0701 - 06/05 0700 In: 1625 [P.O.:1100; I.V.:525] Out: 5050 [Urine:5050] Intake/Output this shift: Total I/O In: -  Out: 1350 [Urine:1350]   Physical Exam Constitutional: Well  nourished. Alert and oriented, No acute distress. HEENT: [place] AT, moist mucus membranes. Trachea midline, no masses. Cardiovascular: No clubbing, cyanosis, or edema. Respiratory: Normal respiratory effort, no increased work of breathing. GI: Abdomen is soft, non tender, non distended, no abdominal masses.  GU: No CVA tenderness.  No bladder fullness or masses.  Foley catheter is draining red tinged urine.   Skin: No rashes, bruises or suspicious lesions. Lymph: No cervical or inguinal adenopathy. Neurologic: Grossly intact, no focal deficits, moving all 4 extremities. Psychiatric: Normal mood and affect.  Lab Results:   Recent Labs  09/23/15 2305  WBC 9.3  HGB 15.0  HCT 44.4  PLT 173   BMET  Recent Labs  09/23/15 2305  NA 139  K 3.6  CL 104  CO2 26  GLUCOSE 107*  BUN 12  CREATININE 1.20  CALCIUM 9.3   PT/INR No results for input(s): LABPROT, INR in the last 72 hours. ABG No results for input(s): PHART, HCO3 in the last 72 hours.  Invalid input(s): PCO2, PO2  Studies/Results: Ct Renal Stone Study  09/24/2015  CLINICAL DATA:  Acute onset of left-sided abdominal and pelvic pain. Nausea. Initial encounter. EXAM: CT ABDOMEN AND PELVIS WITHOUT CONTRAST TECHNIQUE: Multidetector CT imaging of the abdomen and pelvis was performed following the standard protocol without IV contrast. COMPARISON:  CT of the abdomen and pelvis from 10/18/2014, and MRI of the lumbar spine performed 04/18/2015 FINDINGS: Minimal bibasilar atelectasis noted. Scattered coronary artery calcifications are seen. The liver and spleen are unremarkable in appearance. The patient is status post cholecystectomy, with clips noted at the gallbladder fossa. The pancreas and adrenal glands are unremarkable. There is minimal right-sided hydronephrosis and mild-to-moderate left-sided hydronephrosis. This reflects an obstructing 7 x 5 mm stone proximally, at the right ureteropelvic junction, and a large 1.1 x 0.8 cm stone  at the proximal left ureter, just below the left renal pelvis. Left-sided perinephric stranding is noted. Minimal right-sided perinephric stranding is also seen. Nonobstructing bilateral renal stones measure up to 4 mm in size. No free fluid is identified. The small bowel is unremarkable in appearance. The stomach is within normal limits. No acute vascular abnormalities are seen. Scattered calcification is seen along the abdominal aorta and its branches. The appendix is  normal in caliber, without evidence of appendicitis. Scattered diverticulosis is noted along the descending and sigmoid colon, without evidence of diverticulitis. The bladder is mildly distended and grossly unremarkable. The prostate is normal in size. No inguinal lymphadenopathy is seen. No acute osseous abnormalities are identified. IMPRESSION: 1. Bilateral ureteral obstruction noted, raising concern for impending renal failure. Urgent intervention is necessary. 2. Minimal right-sided hydronephrosis and mild-to-moderate left-sided hydronephrosis. This reflects an obstructing 7 x 5 mm stone proximally at the right ureteropelvic junction, and a large 1.1 x 0.8 cm stone at the proximal left ureter, just below the left renal pelvis. 3. Nonobstructing bilateral renal stones measure up to 4 mm in size. 4. Scattered coronary artery calcifications seen. 5. Scattered calcification along the abdominal aorta and its branches. 6. Scattered diverticulosis along the descending and sigmoid colon, without evidence of diverticulitis. These results were called by telephone at the time of interpretation on 09/24/2015 at 1:11 am to Dr. Lurline Hare, who verbally acknowledged these results. Electronically Signed   By: Garald Balding M.D.   On: 09/24/2015 01:16     Assessment: s/p Procedure(s): CYSTOSCOPY WITH BILATERAL RETROGRADES, BILATERAL STENT PLACEMENT Bilateral ureteral stones, bilateral hydronephrosis in patient with coronary artery disease with heart  catheterization scheduled for Monday.  Bilateral stents placed on 09/24/2015 by Dr. Junious Silk.   Plan: Patient will need an appointment in two weeks to discuss definitive treatment for his bilateral ureteral stones pending the results of his heart cath today.  Urine culture is still pending.  Continue foley.      LOS: 1 day    [facility] 09/25/2015

## 2015-09-26 ENCOUNTER — Encounter: Payer: Self-pay | Admitting: Cardiovascular Disease

## 2015-09-27 NOTE — Discharge Summary (Signed)
Willie Clark, 65 y.o., DOB 1950-12-29, MRN [id number]. 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 Patient was evaluated in the ED and was noted to have bilateral ureter obstruction and hydronephrosis. Patient was seen by urology and underwent cystoscopy with bilateral retrograde pyelogram and bilateral ureteral stent placement. Patient tolerated the procedure well and was recommended to follow-up with urology. Patient also had abnormal stress test and was scheduled to have cardiac catheter on the day of admission and therefore was seen by cardiology and had a cardiac catheter which showed unchanged chronic blockage in his heart. They recommended continue medical therapy. Patient is doing better and is stable for discharge             Consults  cardiology  Significant Tests:  See full reports for all details      Dg Chest 2 View  09/14/2015  CLINICAL DATA:  Preop cardiac catheterization. EXAM: CHEST  2 VIEW COMPARISON:  06/23/2013  FINDINGS: Lungs adequately inflated without consolidation or effusion. Minimal linear scarring over the left base. Cardiomediastinal silhouette is within normal. Mild degenerate change of the spine. Previous median sternotomy. Surgical anchors of the left humeral head. Fusion hardware over the cervical thoracic junction. IMPRESSION: No acute cardiopulmonary disease. Electronically Signed   By: Marin Olp M.D.   On: 09/14/2015 20:27   Ct Renal Stone Study  09/24/2015  CLINICAL DATA:  Acute onset of left-sided abdominal and pelvic pain. Nausea. Initial encounter. EXAM: CT ABDOMEN AND PELVIS WITHOUT CONTRAST TECHNIQUE: Multidetector CT imaging of the abdomen and pelvis was performed following the standard protocol without IV contrast. COMPARISON:  CT of the abdomen and pelvis from 10/18/2014, and MRI of the lumbar spine performed 04/18/2015 FINDINGS: Minimal bibasilar atelectasis noted. Scattered coronary artery calcifications are seen. The liver and spleen are unremarkable in appearance. The patient is status post cholecystectomy, with clips noted at the gallbladder fossa. The pancreas and adrenal glands are unremarkable. There is minimal right-sided hydronephrosis and mild-to-moderate left-sided hydronephrosis. This reflects an obstructing 7 x 5 mm stone proximally, at the right ureteropelvic junction, and a large 1.1 x 0.8 cm stone at the proximal left ureter, just below the left renal pelvis. Left-sided perinephric stranding is noted. Minimal right-sided perinephric stranding is also seen. Nonobstructing bilateral renal stones measure up to 4 mm in size. No free fluid is identified. The small bowel is unremarkable in appearance. The stomach is within normal limits. No acute vascular abnormalities are seen. Scattered calcification is seen along the abdominal aorta and its branches. The appendix is normal in caliber, without evidence of appendicitis. Scattered diverticulosis is noted along  the descending and  sigmoid colon, without evidence of diverticulitis. The bladder is mildly distended and grossly unremarkable. The prostate is normal in size. No inguinal lymphadenopathy is seen. No acute osseous abnormalities are identified. IMPRESSION: 1. Bilateral ureteral obstruction noted, raising concern for impending renal failure. Urgent intervention is necessary. 2. Minimal right-sided hydronephrosis and mild-to-moderate left-sided hydronephrosis. This reflects an obstructing 7 x 5 mm stone proximally at the right ureteropelvic junction, and a large 1.1 x 0.8 cm stone at the proximal left ureter, just below the left renal pelvis. 3. Nonobstructing bilateral renal stones measure up to 4 mm in size. 4. Scattered coronary artery calcifications seen. 5. Scattered calcification along the abdominal aorta and its branches. 6. Scattered diverticulosis along the descending and sigmoid colon, without evidence of diverticulitis. These results were called by telephone at the time of interpretation on 09/24/2015 at 1:11 am to Dr. Lurline Hare, who verbally acknowledged these results. Electronically Signed   By: Garald Balding M.D.   On: 09/24/2015 01:16       Today   Subjective:   Willie Clark  Feeling better denies any chest pain or shortness of breath  Objective:   Blood pressure 107/62, pulse 68, temperature 98.2 F (36.8 C), temperature source Oral, resp. rate 16, height 5' 9.5" (1.765 m), weight 87.272 kg (192 lb 6.4 oz), SpO2 97 %.  . No intake or output data in the 24 hours ending 09/27/15 1324  Exam VITAL SIGNS: Blood pressure 107/62, pulse 68, temperature 98.2 F (36.8 C), temperature source Oral, resp. rate 16, height 5' 9.5" (1.765 m), weight 87.272 kg (192 lb 6.4 oz), SpO2 97 %.  GENERAL:  65 y.o.-year-old patient lying in the bed with no acute distress.  EYES: Pupils equal, round, reactive to light and accommodation. No scleral icterus. Extraocular muscles intact.  HEENT: Head atraumatic, normocephalic.  Oropharynx and nasopharynx clear.  NECK:  Supple, no jugular venous distention. No thyroid enlargement, no tenderness.  LUNGS: Normal breath sounds bilaterally, no wheezing, rales,rhonchi or crepitation. No use of accessory muscles of respiration.  CARDIOVASCULAR: S1, S2 normal. No murmurs, rubs, or gallops.  ABDOMEN: Soft, nontender, nondistended. Bowel sounds present. No organomegaly or mass.  EXTREMITIES: No pedal edema, cyanosis, or clubbing.  NEUROLOGIC: Cranial nerves II through XII are intact. Muscle strength 5/5 in all extremities. Sensation intact. Gait not checked.  PSYCHIATRIC: The patient is alert and oriented x 3.  SKIN: No obvious rash, lesion, or ulcer.   Data Review     CBC w Diff:  Lab Results  Component Value Date   WBC 9.3 09/23/2015   WBC 7.7 08/01/2014   HGB 15.0 09/23/2015   HGB 13.9 08/01/2014   HCT 44.4 09/23/2015   HCT 42.0 08/01/2014   PLT 173 09/23/2015   PLT 166 08/01/2014   LYMPHOPCT 24% 09/23/2015   LYMPHOPCT 28.7 08/01/2014   MONOPCT 7% 09/23/2015   MONOPCT 5.3 08/01/2014   EOSPCT 1% 09/23/2015   EOSPCT 0.9 08/01/2014   BASOPCT 0% 09/23/2015   BASOPCT 0.2 08/01/2014   CMP:  Lab Results  Component Value Date   NA 139 09/23/2015   NA 140 01/10/2015   NA 139 08/01/2014   K 3.6 09/23/2015   K 3.4* 08/01/2014   CL 104 09/23/2015   CL 108 08/01/2014   CO2 26 09/23/2015   CO2 25 08/01/2014   BUN 12 09/23/2015   BUN 13 01/10/2015   BUN 10 08/01/2014   CREATININE 1.20 09/23/2015   CREATININE 1.05 08/01/2014  .  Micro Results Recent Results (from the past 240 hour(s))  Urine culture     Status: None   Collection Time: 09/24/15  6:11 AM  Result Value Ref Range Status   Specimen Description URINE, CLEAN CATCH  Final   Special Requests ANCEF  Final   Culture NO GROWTH Performed at [facility]   Final   Report Status 09/25/2015 FINAL  Final     Code Status History    Date Active Date Inactive Code Status Order ID Comments  User Context   09/24/2015  7:50 AM 09/25/2015 10:04 PM Full Code [id number]  Harrie Foreman, MD Inpatient          Follow-up Information    Follow up with [name], MD In 7 days.   Specialty:  Internal Medicine   Why:  You will need to call your primary for an appointment, ccs   Contact information:   [place] [address] [phone]       Follow up with Nicolette Bang, MD.   Specialty:  Urology   Why:  Friday, June 23rd at 945am, ccs   Contact information:   [place] [address] [phone]       Discharge Medications     Medication List    TAKE these medications        acetaminophen 500 MG tablet  Commonly known as:  TYLENOL  Take 500 mg by mouth every 8 (eight) hours as needed for mild pain.     aspirin EC 81 MG tablet  Take 81 mg by mouth daily.     citalopram 20 MG tablet  Commonly known as:  CELEXA  Take 20 mg by mouth daily.     enalapril 20 MG tablet  Commonly known as:  VASOTEC  Take 0.5 tablets (10 mg total) by mouth daily.     fexofenadine 180 MG tablet  Commonly known as:  ALLEGRA  Take 180 mg by mouth daily.     FIBER PO  Take 1 tablet by mouth daily.     furosemide 40 MG tablet  Commonly known as:  LASIX  Take 1 tablet (40 mg total) by mouth daily as needed.     gabapentin 800 MG tablet  Commonly known as:  NEURONTIN  Take 800 mg by mouth 3 (three) times daily.     GAS-X PO  Take 1 tablet by mouth as needed (gas).     HAIR/SKIN/NAILS PO  Take 1 tablet by mouth 3 (three) times daily.     metoprolol succinate 25 MG 24 hr tablet  Commonly known as:  TOPROL-XL  Take 25 mg by mouth daily.     nitroGLYCERIN 0.4 MG SL tablet  Commonly known as:  NITROSTAT  Place 0.4 mg under the tongue every 5 (five) minutes as needed for chest pain.     nortriptyline 25 MG capsule  Commonly known as:  PAMELOR  Take 25 mg by mouth daily.     pantoprazole 40 MG tablet  Commonly  known as:  PROTONIX  Take 40 mg by mouth 2 (two) times daily.     potassium chloride SA 20 MEQ tablet  Commonly known as:  K-DUR,KLOR-CON  Take 20 mEq by mouth daily.     rosuvastatin 20 MG tablet  Commonly known as:  CRESTOR  Take 20 mg by mouth daily.     sucralfate 1 g tablet  Commonly known as:  CARAFATE  Take by  mouth 4 (four) times daily.     tamsulosin 0.4 MG Caps capsule  Commonly known as:  FLOMAX  Take 0.4 mg by mouth daily.     topiramate 50 MG tablet  Commonly known as:  TOPAMAX  Take 50 mg by mouth 2 (two) times daily.     vitamin B-12 1000 MCG tablet  Commonly known as:  CYANOCOBALAMIN  Take 1,000 mcg by mouth daily.           Total Time in preparing paper work, data evaluation and todays exam - 35 minutes  Dustin Flock M.D on 09/27/2015 at 1:24 PM  [facility]   Office  [phone]

## 2015-09-28 ENCOUNTER — Telehealth: Payer: Self-pay | Admitting: Urology

## 2015-09-28 ENCOUNTER — Ambulatory Visit: Payer: Medicare HMO | Admitting: Cardiovascular Disease

## 2015-09-28 DIAGNOSIS — R350 Frequency of micturition: Secondary | ICD-10-CM

## 2015-09-28 DIAGNOSIS — N3289 Other specified disorders of bladder: Secondary | ICD-10-CM

## 2015-09-28 NOTE — Telephone Encounter (Signed)
Patient would like something for frequent urination They use [facility] in [place]

## 2015-10-02 ENCOUNTER — Ambulatory Visit: Payer: Medicare HMO | Admitting: Cardiovascular Disease

## 2015-10-02 MED ORDER — OXYBUTYNIN CHLORIDE 5 MG PO TABS: 5.0000 mg | ORAL_TABLET | Freq: Three times a day (TID) | ORAL | Status: DC

## 2015-10-02 NOTE — Telephone Encounter (Signed)
Per Dr. Erlene Quan ok to send in oxybutinin to pharmacy, patient to follow up as scheduled

## 2015-10-05 ENCOUNTER — Telehealth: Payer: Self-pay | Admitting: Urology

## 2015-10-05 NOTE — Telephone Encounter (Signed)
Patient's wife called and cx the appt for 10-13-15 for his new patient consult to discuss follow surgery. They did not want to reschedule   Thanks,  Sharyn Lull

## 2015-10-13 ENCOUNTER — Ambulatory Visit: Payer: Self-pay

## 2015-10-17 ENCOUNTER — Telehealth: Payer: Self-pay | Admitting: Cardiovascular Disease

## 2015-10-17 NOTE — Telephone Encounter (Signed)
S/w pt wife, Thayer Headings, (on [place]) who reports pt having kidney stones and stents removed  tomorrow at [facility]. Pt having chest pain/pressure when he urinates which is every 20-30 minutes. No chest pressure any other time. He is taking oxybutnin for overactive bladder. Wife thinks he is "stressing out from having to go to the bathroom so much". She also feels he is worried about theDenies any other sx. Pt had cardiac cath 6/5 while admitted to [facility] for kidney stones. Per results notes "Recommendations: Continue aggressive medical therapy. The patient can be discharged home from a cardiac standpoint"  Advised wife of s/s that would require immediate attention in an ER setting. She verbalized understanding and assures me he is having no other sx at this time. Advised her to make medical staff aware tomorrow prior to procedure of any sx he is having.  She will update [place] next week on tomorrow's procedure.  She is appreciative of the call with no further questions at this time.

## 2015-10-17 NOTE — Telephone Encounter (Signed)
Patient wife wants Dr. Fletcher Anon to know patient is have kidney stones blasted tomorrow in [place].  Patient is having chest pain from going to bathroom so much   Pt c/o of Chest Pain: STAT if CP now or developed within 24 hours  1. Are you having CP right now? Not sure   2. Are you experiencing any other symptoms (ex. SOB, nausea, vomiting, sweating)? Sob as always   3. How long have you been experiencing CP?  Not sure per wife he told he yesterday   4. Is your CP continuous or coming and going? When he goes to the bathroom   5. Have you taken Nitroglycerin? No  ?

## 2015-10-30 ENCOUNTER — Ambulatory Visit: Payer: Medicare HMO | Admitting: Cardiovascular Disease

## 2015-11-10 ENCOUNTER — Telehealth: Payer: Self-pay | Admitting: Cardiovascular Disease

## 2015-11-10 NOTE — Telephone Encounter (Signed)
Pt wife wanted to let Dr. Fletcher Anon know pt is in [facility] with an UTI

## 2015-11-10 NOTE — Telephone Encounter (Signed)
I will forward to Dr Fletcher Anon

## 2015-11-20 ENCOUNTER — Encounter: Payer: Self-pay | Admitting: Cardiovascular Disease

## 2015-11-20 ENCOUNTER — Ambulatory Visit (INDEPENDENT_AMBULATORY_CARE_PROVIDER_SITE_OTHER): Payer: Medicare HMO | Admitting: Cardiovascular Disease

## 2015-11-20 VITALS — BP 100/60 | HR 60 | Ht 69.5 in | Wt 194.8 lb

## 2015-11-20 DIAGNOSIS — I10 Essential (primary) hypertension: Secondary | ICD-10-CM

## 2015-11-20 DIAGNOSIS — E785 Hyperlipidemia, unspecified: Secondary | ICD-10-CM | POA: Diagnosis not present

## 2015-11-20 DIAGNOSIS — I25118 Atherosclerotic heart disease of native coronary artery with other forms of angina pectoris: Secondary | ICD-10-CM

## 2015-11-20 NOTE — Progress Notes (Signed)
Cardiology Office Note   Date:  11/20/2015   ID:  Willie Clark, DOB [birth year], MRN [id number]  PCP:  Willie Sia, MD  Cardiologist:   Willie Sacramento, MD   Chief Complaint  Patient presents with  . Follow-up    some sob, no cp or swelling.      History of Present Illness: Willie Clark is a 65 y.o. male who presents for  a followup visit. He has known history of coronary artery disease. He is status post coronary artery bypass graft surgery in 1993 and redo in 2007.   He suffers from fibromyalgia and chronic pain.  He also has chronic systolic heart failure with mildly reduced LV systolic function. He had GI symptoms in 2015 due to documented gastric ulcer. This improved with treatment. He had neck surgery in [id number] without complications.  He was seen recently for worsening exertional chest pain. I proceeded with a nuclear stress test which showed evidence of anterolateral ischemia which is not different from before. However, there was a new area of anteroseptal ischemia. LV systolic function was mildly reduced and the study was overall moderate risk. He was placed on isosorbide with no significant improvement in symptoms.  I proceeded with left heart catheterization in June which showed significant underlying three-vessel disease with patent grafts and no change in coronary anatomy since most recent cardiac catheterization. Ejection fraction was 50-55% with normal left ventricular end-diastolic pressure. He was shortly hospitalized after that with pyelonephritis and kidney stones. He was at [facility] for almost a week. He is feeling better now. He continues to complain of fatigue and having no energy. He has been taking his medications regularly.   Past Medical History:  Diagnosis Date  . Barrett's esophagus   . Bowel obstruction ([place]) 02/2009   small bowel  . Chronic chest pain   . Coronary artery disease 1993   MI   . Depression   . Diverticulitis   . Fibromyalgia    . Hyperlipidemia   . Hypertension   . Kidney stones   . Lightheadedness   . Mass on back   . MI (myocardial infarction) ([place])   . OSA (obstructive sleep apnea)   . Pulmonary embolism ([place]) 01/2006   Post-op/treated  . Skin lesions, generalized   . Sleep apnea   . SOB (shortness of breath)   . Stroke ([place])   . Syncope and collapse   . Urticaria     Past Surgical History:  Procedure Laterality Date  . BACK SURGERY    . bowel obstruction  01/2009  . CARDIAC CATHETERIZATION  11/2006   patent grafts. Significant OM3 disease and occluded diagonals.  [name] CARDIAC CATHETERIZATION  06/2010   patent grafts. significant ISR in proximal LCX but OM2 is bypassed and gives retrograde flow to OM3.   [name] CARDIAC CATHETERIZATION  06/2013   [facility]: Patent grafts with 60% proximal in-stent restenosis in the left circumflex with FFR of 0.85  . CARDIAC CATHETERIZATION Left 09/25/2015   Procedure: Left Heart Cath and Cors/Grafts Angiography;  Surgeon: Wellington Hampshire, MD;  Location: [facility];  Service: Cardiovascular;  Laterality: Left;  . CHOLECYSTECTOMY    . CORONARY ARTERY BYPASS GRAFT  1993/01/2006   redo at [facility]. LIMA to LAD, SVG to OM2 and SVG to RPDA  . CYSTOSCOPY WITH STENT PLACEMENT Bilateral 09/24/2015   Procedure: CYSTOSCOPY WITH BILATERAL RETROGRADES, BILATERAL STENT PLACEMENT;  Surgeon: Festus Aloe, MD;  Location: [facility] ORS;  Service:  Urology;  Laterality: Bilateral;  . ESOPHAGOGASTRODUODENOSCOPY N/A 10/25/2014   Procedure: ESOPHAGOGASTRODUODENOSCOPY (EGD);  Surgeon: Lollie Sails, MD;  Location: [facility] ENDOSCOPY;  Service: Endoscopy;  Laterality: N/A;  . NECK SURGERY  06/2009  . SHOULDER SURGERY  2010     Current Outpatient Prescriptions  Medication Sig Dispense Refill  . acetaminophen (TYLENOL) 500 MG tablet Take 500 mg by mouth every 8 (eight) hours as needed for mild pain.     [name] aspirin EC 81 MG tablet Take 81 mg by mouth daily.    . citalopram (CELEXA) 20 MG tablet Take  20 mg by mouth daily.     . enalapril (VASOTEC) 20 MG tablet Take 0.5 tablets (10 mg total) by mouth daily. 90 tablet 0  . fexofenadine (ALLEGRA) 180 MG tablet Take 180 mg by mouth daily.    [name] FIBER PO Take 1 tablet by mouth daily.     . furosemide (LASIX) 40 MG tablet Take 1 tablet (40 mg total) by mouth daily as needed. (Patient taking differently: Take 40 mg by mouth daily as needed for fluid. ) 30 tablet 3  . gabapentin (NEURONTIN) 800 MG tablet Take 800 mg by mouth 3 (three) times daily.    . metoprolol succinate (TOPROL-XL) 25 MG 24 hr tablet Take 25 mg by mouth daily.    . Multiple Vitamins-Minerals (HAIR/SKIN/NAILS PO) Take 1 tablet by mouth 3 (three) times daily.     . nitroGLYCERIN (NITROSTAT) 0.4 MG SL tablet Place 0.4 mg under the tongue every 5 (five) minutes as needed for chest pain.     . nortriptyline (PAMELOR) 25 MG capsule Take 25 mg by mouth daily.     [name] oxybutynin (DITROPAN) 5 MG tablet Take 1 tablet (5 mg total) by mouth 3 (three) times daily. 90 tablet 0  . pantoprazole (PROTONIX) 40 MG tablet Take 40 mg by mouth 2 (two) times daily.     . potassium chloride SA (K-DUR,KLOR-CON) 20 MEQ tablet Take 20 mEq by mouth daily.    . rosuvastatin (CRESTOR) 20 MG tablet Take 20 mg by mouth daily.      . Simethicone (GAS-X PO) Take 1 tablet by mouth as needed (gas).     . sucralfate (CARAFATE) 1 G tablet Take by mouth 4 (four) times daily.     . tamsulosin (FLOMAX) 0.4 MG CAPS capsule Take 0.4 mg by mouth daily.    [name] topiramate (TOPAMAX) 50 MG tablet Take 50 mg by mouth 2 (two) times daily.     . vitamin B-12 (CYANOCOBALAMIN) 1000 MCG tablet Take 1,000 mcg by mouth daily.     No current facility-administered medications for this visit.     Allergies:   Amitriptyline; Berniece Salines flavor; Cyclobenzaprine; Fish oil; Lidocaine; and Other    Social History:  The patient  reports that he quit smoking about 24 years ago. His smoking use included Cigarettes. He smoked 2.50 packs per day. He  does not have any smokeless tobacco history on file. He reports that he drinks alcohol. He reports that he does not use drugs.   Family History:  The patient's family history includes Heart attack in his father; Heart disease (age of onset: 51) in his father.    ROS:  Please see the history of present illness.   Otherwise, review of systems are positive for none.   All other systems are reviewed and negative.    PHYSICAL EXAM: VS:  BP 100/60 (BP Location: Left Arm, Patient Position: Sitting, Cuff Size: Normal)  Pulse 60   Ht 5' 9.5" (1.765 m)   Wt 194 lb 12.8 oz (88.4 kg)   BMI 28.35 kg/m  , BMI Body mass index is 28.35 kg/m. GEN: Well nourished, well developed, in no acute distress HEENT: normal Neck: no JVD, carotid bruits, or masses Cardiac: RRR; no murmurs, rubs, or gallops,no edema  Respiratory:  clear to auscultation bilaterally, normal work of breathing GI: soft, nontender, nondistended, + BS MS: no deformity or atrophy Skin: warm and dry, no rash Neuro:  Strength and sensation are intact Psych: euthymic mood, full affect   EKG:  EKG is not ordered today.    Recent Labs: 01/10/2015: BNP 23.8 09/23/2015: BUN 12; Creatinine, Ser 1.20; Hemoglobin 15.0; Platelets 173; Potassium 3.6; Sodium 139 09/24/2015: TSH 4.033    Lipid Panel No results found for: CHOL, TRIG, HDL, CHOLHDL, VLDL, LDLCALC, LDLDIRECT    Wt Readings from Last 3 Encounters:  11/20/15 194 lb 12.8 oz (88.4 kg)  09/25/15 192 lb 6.4 oz (87.3 kg)  09/14/15 190 lb (86.2 kg)         ASSESSMENT AND PLAN:  1.  Coronary artery disease involving native coronary arteries with Chronic angina:  Cardiac catheterization in June showed patent grafts and no change from before. He did not tolerate long-acting nitroglycerin due to headache. He seems to be doing better than before although he still recovering from recent pyelonephritis and kidney stones. Continue medical therapy.  2. Essential hypertension: Blood  pressure is well controlled on current medications.  3. Chronic venous insufficiency: The patient had worsening edema during last visit which has resolved completely. BNP was normal. He takes furosemide as needed.  4. Hyperlipidemia: Continue current dose of rosuvastatin with a target LDL of less than 70.    Disposition:   FU with me in 6 months  Signed,  Willie Sacramento, MD  11/20/2015 3:12 PM    [place]

## 2015-11-20 NOTE — Patient Instructions (Signed)
Medication Instructions: Continue same medications.   Labwork: None.   Procedures/Testing: None.   Follow-Up: 6 months with Dr. Daymen Hassebrock.   Any Additional Special Instructions Will Be Listed Below (If Applicable).     If you need a refill on your cardiac medications before your next appointment, please call your pharmacy.   

## 2015-12-21 ENCOUNTER — Telehealth: Payer: Self-pay | Admitting: Cardiovascular Disease

## 2015-12-21 NOTE — Telephone Encounter (Signed)
Ok to stop Aspirin 1 week before surgery.

## 2015-12-21 NOTE — Telephone Encounter (Signed)
Pt needs to have hernia surgery, and needs to stop aspirin 1 week prior. Please advise.

## 2015-12-21 NOTE — Telephone Encounter (Signed)
Per [facility], pt scheduled to have hernia surgery date TBD. Would like approval to stop ASA 1 week prior. Forward to MD

## 2015-12-21 NOTE — Telephone Encounter (Signed)
Faxed clearance letter to [facility], Attn: Judeen Hammans @ [phone]

## 2016-01-08 ENCOUNTER — Other Ambulatory Visit: Payer: Self-pay | Admitting: Cardiovascular Disease

## 2016-01-16 ENCOUNTER — Encounter
Admission: RE | Admit: 2016-01-16 | Discharge: 2016-01-16 | Disposition: A | Discharge status: Home / Self Care | Payer: Medicare HMO | Source: Ambulatory Visit | Attending: Surgery | Admitting: Surgery

## 2016-01-16 ENCOUNTER — Telehealth: Payer: Self-pay | Admitting: Cardiovascular Disease

## 2016-01-16 DIAGNOSIS — Z01818 Encounter for other preprocedural examination: Secondary | ICD-10-CM | POA: Diagnosis present

## 2016-01-16 DIAGNOSIS — K4020 Bilateral inguinal hernia, without obstruction or gangrene, not specified as recurrent: Secondary | ICD-10-CM | POA: Insufficient documentation

## 2016-01-16 HISTORY — DX: Headache: R51

## 2016-01-16 HISTORY — DX: Anxiety disorder, unspecified: F41.9

## 2016-01-16 HISTORY — DX: Gastro-esophageal reflux disease without esophagitis: K21.9

## 2016-01-16 HISTORY — DX: Gastritis, unspecified, without bleeding: K29.70

## 2016-01-16 HISTORY — DX: Headache, unspecified: R51.9

## 2016-01-16 HISTORY — DX: Unspecified osteoarthritis, unspecified site: M19.90

## 2016-01-16 HISTORY — DX: Heart failure, unspecified: I50.9

## 2016-01-16 LAB — DIFFERENTIAL
BASOS PCT: 0 %
Basophils Absolute: 0 10*3/uL (ref 0–0.1)
EOS ABS: 0.1 10*3/uL (ref 0–0.7)
Eosinophils Relative: 1 %
LYMPHS ABS: 1.6 10*3/uL (ref 1.0–3.6)
Lymphocytes Relative: 32 %
MONO ABS: 0.3 10*3/uL (ref 0.2–1.0)
MONOS PCT: 6 %
NEUTROS ABS: 3.1 10*3/uL (ref 1.4–6.5)
Neutrophils Relative %: 61 %

## 2016-01-16 LAB — BASIC METABOLIC PANEL
Anion gap: 5 (ref 5–15)
BUN: 10 mg/dL (ref 6–20)
CO2: 28 mmol/L (ref 22–32)
Calcium: 9.2 mg/dL (ref 8.9–10.3)
Chloride: 105 mmol/L (ref 101–111)
Creatinine, Ser: 1.01 mg/dL (ref 0.61–1.24)
GFR calc Af Amer: >60 mL/min (ref >60)
GLUCOSE: 108 mg/dL — AB (ref 65–99)
POTASSIUM: 4.3 mmol/L (ref 3.5–5.1)
Sodium: 138 mmol/L (ref 135–145)

## 2016-01-16 LAB — CBC
HEMATOCRIT: 39.7 % — AB (ref 40.0–52.0)
HEMOGLOBIN: 14 g/dL (ref 13.0–18.0)
MCH: 33.4 pg (ref 26.0–34.0)
MCHC: 35.2 g/dL (ref 32.0–36.0)
MCV: 94.9 fL (ref 80.0–100.0)
Platelets: 162 10*3/uL (ref 150–440)
RBC: 4.18 MIL/uL — ABNORMAL LOW (ref 4.40–5.90)
RDW: 14.7 % — AB (ref 11.5–14.5)
WBC: 5.1 10*3/uL (ref 3.8–10.6)

## 2016-01-16 NOTE — Telephone Encounter (Signed)
Low risk. OK to hold aspirin.

## 2016-01-16 NOTE — Patient Instructions (Signed)
  Your procedure is scheduled on: January 23, 2016 (Tuesday) Report to Same Day Surgery 2nd floor Medical Mall To find out your arrival time please call [phone] between 1PM - 3PM on  January 22, 2016 (Monday)  Remember: Instructions that are not followed completely may result in serious medical risk, up to and including death, or upon the discretion of your surgeon and anesthesiologist your surgery may need to be rescheduled.    _x___ 1. Do not eat food or drink liquids after midnight. No gum chewing or hard candies.     _x__ 2. No Alcohol for 24 hours before or after surgery.   _x___3. No Smoking for 24 prior to surgery.   ____  4. Bring all medications with you on the day of surgery if instructed.    __x__ 5. Notify your doctor if there is any change in your medical condition     (cold, fever, infections).     Do not wear jewelry, make-up, hairpins, clips or nail polish.  Do not wear lotions, powders, or perfumes. You may wear deodorant.  Do not shave 48 hours prior to surgery. Men may shave face and neck.  Do not bring valuables to the hospital.    [facility] is not responsible for any belongings or valuables.               Contacts, dentures or bridgework may not be worn into surgery.  Leave your suitcase in the car. After surgery it may be brought to your room.  For patients admitted to the hospital, discharge time is determined by your treatment team.   Patients discharged the day of surgery will not be allowed to drive home.    Please read over the following fact sheets that you were given:   [facility] Preparing for Surgery and or MRSA Information   _x___ Take these medicines the morning of surgery with A SIP OF WATER:    1. Citalopram  2. Gabapentin   3. Metoprolol  4. Pantoprazole  5. Enalapril  ____Fleets enema or Magnesium Citrate as directed.   _x___ Use CHG Soap or sage wipes as directed on instruction sheet   ____ Use inhalers on the day of surgery  and bring to hospital day of surgery  ____ Stop metformin 2 days prior to surgery    ____ Take 1/2 of usual insulin dose the night before surgery and none on the morning of surgery.            x__ Stop aspirin or coumadin, or plavix (STOP ASPIRIN TODAY)  x__ Stop Anti-inflammatories such as Advil, Aleve, Ibuprofen, Motrin, Naproxen,          Naprosyn, Goodies powders or aspirin products. Ok to take Tylenol.  _X__ Stop supplements until after surgery.  (STOP VITAMIN B-12 TODAY)  _x___ Bring C-Pap to the hospital.

## 2016-01-16 NOTE — Telephone Encounter (Signed)
Per Baker Janus in PAT, pt needs clearance for 10/3 bilateral hernial surgery. Original request was to hold aspirin. Now needs cardiac clearance. Forward to MD to advise.

## 2016-01-16 NOTE — Telephone Encounter (Signed)
Patient needs surgical clearance for Bilateral Hernia  On 01-23-16.  Patient clearance to hold asa was previously sent.  But they need clearance that he is ok for the actual surgery.  Please fax to [phone].

## 2016-01-17 NOTE — Pre-Procedure Instructions (Signed)
Cardiac clearance letter received from Dr. Fletcher Anon, stated ok to proceed with surgery.

## 2016-01-17 NOTE — Telephone Encounter (Signed)
Routed clearance letter to [employer], [id number]

## 2016-01-23 ENCOUNTER — Ambulatory Visit: Payer: Medicare HMO | Admitting: Anesthesiology

## 2016-01-23 ENCOUNTER — Encounter: Payer: Self-pay | Admitting: *Deleted

## 2016-01-23 ENCOUNTER — Encounter
Admission: RE | Disposition: A | Discharge status: Home / Self Care | Payer: Self-pay | Source: Ambulatory Visit | Attending: Surgery

## 2016-01-23 ENCOUNTER — Ambulatory Visit
Admission: RE | Admit: 2016-01-23 | Discharge: 2016-01-23 | Disposition: A | Discharge status: Home / Self Care | Payer: Medicare HMO | Source: Ambulatory Visit | Attending: Surgery | Admitting: Surgery

## 2016-01-23 DIAGNOSIS — M1990 Unspecified osteoarthritis, unspecified site: Secondary | ICD-10-CM | POA: Diagnosis not present

## 2016-01-23 DIAGNOSIS — I509 Heart failure, unspecified: Secondary | ICD-10-CM | POA: Diagnosis not present

## 2016-01-23 DIAGNOSIS — I739 Peripheral vascular disease, unspecified: Secondary | ICD-10-CM | POA: Insufficient documentation

## 2016-01-23 DIAGNOSIS — F419 Anxiety disorder, unspecified: Secondary | ICD-10-CM | POA: Diagnosis not present

## 2016-01-23 DIAGNOSIS — Z8673 Personal history of transient ischemic attack (TIA), and cerebral infarction without residual deficits: Secondary | ICD-10-CM | POA: Diagnosis not present

## 2016-01-23 DIAGNOSIS — Z7982 Long term (current) use of aspirin: Secondary | ICD-10-CM | POA: Insufficient documentation

## 2016-01-23 DIAGNOSIS — K4020 Bilateral inguinal hernia, without obstruction or gangrene, not specified as recurrent: Secondary | ICD-10-CM | POA: Diagnosis not present

## 2016-01-23 DIAGNOSIS — Z951 Presence of aortocoronary bypass graft: Secondary | ICD-10-CM | POA: Insufficient documentation

## 2016-01-23 DIAGNOSIS — I252 Old myocardial infarction: Secondary | ICD-10-CM | POA: Insufficient documentation

## 2016-01-23 DIAGNOSIS — Z8249 Family history of ischemic heart disease and other diseases of the circulatory system: Secondary | ICD-10-CM | POA: Insufficient documentation

## 2016-01-23 DIAGNOSIS — K219 Gastro-esophageal reflux disease without esophagitis: Secondary | ICD-10-CM | POA: Diagnosis not present

## 2016-01-23 DIAGNOSIS — M797 Fibromyalgia: Secondary | ICD-10-CM | POA: Insufficient documentation

## 2016-01-23 DIAGNOSIS — I2510 Atherosclerotic heart disease of native coronary artery without angina pectoris: Secondary | ICD-10-CM | POA: Insufficient documentation

## 2016-01-23 DIAGNOSIS — F329 Major depressive disorder, single episode, unspecified: Secondary | ICD-10-CM | POA: Diagnosis not present

## 2016-01-23 DIAGNOSIS — G4733 Obstructive sleep apnea (adult) (pediatric): Secondary | ICD-10-CM | POA: Insufficient documentation

## 2016-01-23 DIAGNOSIS — I110 Hypertensive heart disease with heart failure: Secondary | ICD-10-CM | POA: Insufficient documentation

## 2016-01-23 DIAGNOSIS — Z86711 Personal history of pulmonary embolism: Secondary | ICD-10-CM | POA: Insufficient documentation

## 2016-01-23 DIAGNOSIS — E785 Hyperlipidemia, unspecified: Secondary | ICD-10-CM | POA: Diagnosis not present

## 2016-01-23 DIAGNOSIS — N289 Disorder of kidney and ureter, unspecified: Secondary | ICD-10-CM | POA: Insufficient documentation

## 2016-01-23 DIAGNOSIS — Z87891 Personal history of nicotine dependence: Secondary | ICD-10-CM | POA: Insufficient documentation

## 2016-01-23 HISTORY — PX: INGUINAL HERNIA REPAIR: SHX194

## 2016-01-23 SURGERY — REPAIR, HERNIA, INGUINAL, BILATERAL, ADULT
Anesthesia: General | Laterality: Bilateral | Wound class: Clean Contaminated

## 2016-01-23 MED ORDER — MIDAZOLAM HCL 2 MG/2ML IJ SOLN
INTRAMUSCULAR | Status: DC | PRN
  Administered 2016-01-23 (×2): 1 mg via INTRAVENOUS

## 2016-01-23 MED ORDER — OXYCODONE HCL 5 MG PO TABS
5.0000 mg | ORAL_TABLET | Freq: Once | ORAL | Status: AC | PRN
  Administered 2016-01-23: 5 mg via ORAL

## 2016-01-23 MED ORDER — OXYCODONE HCL 5 MG/5ML PO SOLN: 5.0000 mg | Freq: Once | ORAL | Status: AC | PRN

## 2016-01-23 MED ORDER — FENTANYL CITRATE (PF) 100 MCG/2ML IJ SOLN
INTRAMUSCULAR | Status: DC | PRN
  Administered 2016-01-23 (×2): 25 ug via INTRAVENOUS
  Administered 2016-01-23: 50 ug via INTRAVENOUS

## 2016-01-23 MED ORDER — BUPIVACAINE-EPINEPHRINE (PF) 0.5% -1:200000 IJ SOLN
INTRAMUSCULAR | Status: AC
  Filled 2016-01-23: qty 30

## 2016-01-23 MED ORDER — BUPIVACAINE-EPINEPHRINE (PF) 0.5% -1:200000 IJ SOLN
INTRAMUSCULAR | Status: DC | PRN
  Administered 2016-01-23: 30 mL

## 2016-01-23 MED ORDER — SUGAMMADEX SODIUM 200 MG/2ML IV SOLN
INTRAVENOUS | Status: DC | PRN
  Administered 2016-01-23: 200 mg via INTRAVENOUS

## 2016-01-23 MED ORDER — HYDROCODONE-ACETAMINOPHEN 5-325 MG PO TABS: 1.0000 | ORAL_TABLET | ORAL | 0 refills | Status: DC | PRN

## 2016-01-23 MED ORDER — LACTATED RINGERS IV SOLN
INTRAVENOUS | Status: DC
  Administered 2016-01-23 (×2): via INTRAVENOUS

## 2016-01-23 MED ORDER — GLYCOPYRROLATE 0.2 MG/ML IJ SOLN
INTRAMUSCULAR | Status: DC | PRN
  Administered 2016-01-23: 0.2 mg via INTRAVENOUS

## 2016-01-23 MED ORDER — PROPOFOL 10 MG/ML IV BOLUS
INTRAVENOUS | Status: DC | PRN
  Administered 2016-01-23: 120 mg via INTRAVENOUS

## 2016-01-23 MED ORDER — OXYCODONE HCL 5 MG PO TABS
ORAL_TABLET | ORAL | Status: AC
  Filled 2016-01-23: qty 1

## 2016-01-23 MED ORDER — FENTANYL CITRATE (PF) 100 MCG/2ML IJ SOLN: 25.0000 ug | INTRAMUSCULAR | Status: DC | PRN

## 2016-01-23 MED ORDER — PHENYLEPHRINE HCL 10 MG/ML IJ SOLN
INTRAMUSCULAR | Status: DC | PRN
  Administered 2016-01-23: 10 ug/min via INTRAVENOUS

## 2016-01-23 MED ORDER — CEFAZOLIN SODIUM-DEXTROSE 2-4 GM/100ML-% IV SOLN
2.0000 g | Freq: Once | INTRAVENOUS | Status: AC
  Administered 2016-01-23: 2 g via INTRAVENOUS

## 2016-01-23 MED ORDER — CEFAZOLIN SODIUM-DEXTROSE 2-4 GM/100ML-% IV SOLN
INTRAVENOUS | Status: AC
  Filled 2016-01-23: qty 100

## 2016-01-23 MED ORDER — ONDANSETRON HCL 4 MG/2ML IJ SOLN
INTRAMUSCULAR | Status: DC | PRN
  Administered 2016-01-23: 4 mg via INTRAVENOUS

## 2016-01-23 MED ORDER — PHENYLEPHRINE HCL 10 MG/ML IJ SOLN
INTRAMUSCULAR | Status: DC | PRN
  Administered 2016-01-23 (×4): 100 ug via INTRAVENOUS

## 2016-01-23 MED ORDER — HYDROCODONE-ACETAMINOPHEN 5-325 MG PO TABS: 1.0000 | ORAL_TABLET | ORAL | Status: DC | PRN

## 2016-01-23 MED ORDER — ROCURONIUM BROMIDE 100 MG/10ML IV SOLN
INTRAVENOUS | Status: DC | PRN
  Administered 2016-01-23 (×2): 20 mg via INTRAVENOUS
  Administered 2016-01-23: 40 mg via INTRAVENOUS

## 2016-01-23 SURGICAL SUPPLY — 26 items
BLADE CLIPPER SURG (BLADE) ×3 IMPLANT
BLADE SURG 15 STRL LF DISP TIS (BLADE) ×1 IMPLANT
BLADE SURG 15 STRL SS (BLADE) ×2
CANISTER SUCT 1200ML W/VALVE (MISCELLANEOUS) ×3 IMPLANT
CHLORAPREP W/TINT 26ML (MISCELLANEOUS) ×3 IMPLANT
DRAIN PENROSE 5/8X18 LTX STRL (WOUND CARE) ×3 IMPLANT
DRAPE LAPAROTOMY 77X122 PED (DRAPES) ×3 IMPLANT
ELECT REM PT RETURN 9FT ADLT (ELECTROSURGICAL) ×3
ELECTRODE REM PT RTRN 9FT ADLT (ELECTROSURGICAL) ×1 IMPLANT
GLOVE BIO SURGEON STRL SZ7.5 (GLOVE) ×3 IMPLANT
GOWN STRL REUS W/ TWL LRG LVL3 (GOWN DISPOSABLE) ×3 IMPLANT
GOWN STRL REUS W/TWL LRG LVL3 (GOWN DISPOSABLE) ×6
KIT RM TURNOVER STRD PROC AR (KITS) ×3 IMPLANT
LABEL OR SOLS (LABEL) IMPLANT
LIQUID BAND (GAUZE/BANDAGES/DRESSINGS) ×3 IMPLANT
MESH SYNTHETIC 4X6 SOFT BARD (Mesh General) ×1 IMPLANT
MESH SYNTHETIC SOFT BARD 4X6 (Mesh General) ×2 IMPLANT
NEEDLE HYPO 25X1 1.5 SAFETY (NEEDLE) ×3 IMPLANT
NS IRRIG 500ML POUR BTL (IV SOLUTION) ×3 IMPLANT
PACK BASIN MINOR ARMC (MISCELLANEOUS) ×3 IMPLANT
SUT CHROMIC 4 0 RB 1X27 (SUTURE) ×3 IMPLANT
SUT MNCRL AB 4-0 PS2 18 (SUTURE) ×6 IMPLANT
SUT SURGILON 0 30 BLK (SUTURE) ×15 IMPLANT
SUT VIC AB 4-0 SH 27 (SUTURE) ×4
SUT VIC AB 4-0 SH 27XANBCTRL (SUTURE) ×2 IMPLANT
SYRINGE 10CC LL (SYRINGE) ×3 IMPLANT

## 2016-01-23 NOTE — Anesthesia Postprocedure Evaluation (Signed)
Anesthesia Post Note  Patient: CHASITY BLUE  Procedure(s) Performed: Procedure(s) (LRB): HERNIA REPAIR INGUINAL ADULT BILATERAL (Bilateral)  Patient location during evaluation: PACU Anesthesia Type: General Level of consciousness: awake and alert Pain management: pain level controlled Vital Signs Assessment: post-procedure vital signs reviewed and stable Respiratory status: spontaneous breathing, nonlabored ventilation, respiratory function stable and patient connected to nasal cannula oxygen Cardiovascular status: blood pressure returned to baseline and stable Postop Assessment: no signs of nausea or vomiting Anesthetic complications: no    Last Vitals:  Vitals:   01/23/16 1015 01/23/16 1025  BP: 96/63 103/63  Pulse: (!) 107 85  Resp: 13 14  Temp:  36.8 C    Last Pain:  Vitals:   01/23/16 1025  TempSrc:   PainSc: 4                  Joseph K Piscitello

## 2016-01-23 NOTE — Discharge Instructions (Addendum)
Take Tylenol or Norco if needed for pain.  Should not drive or do anything dangerous when taking Norco.  May resume aspirin on Wednesday.  Avoid straining and heavy lifting.  May shower.  AMBULATORY SURGERY  DISCHARGE INSTRUCTIONS   1) The drugs that you were given will stay in your system until tomorrow so for the next 24 hours you should not:  A) Drive an automobile B) Make any legal decisions C) Drink any alcoholic beverage   2) You may resume regular meals tomorrow.  Today it is better to start with liquids and gradually work up to solid foods.  You may eat anything you prefer, but it is better to start with liquids, then soup and crackers, and gradually work up to solid foods.   3) Please notify your doctor immediately if you have any unusual bleeding, trouble breathing, redness and pain at the surgery site, drainage, fever, or pain not relieved by medication.    4) Additional Instructions:        Please contact your physician with any problems or Same Day Surgery at [phone], Monday through Friday 6 am to 4 pm, or [place] at [facility] number at [phone].

## 2016-01-23 NOTE — Op Note (Signed)
OPERATIVE REPORT  PREOPERATIVE DIAGNOSIS: bilateral inguinal hernia  POSTOPERATIVE DIAGNOSIS:bilateral  inguinal hernia  PROCEDURE:  bilateral inguinal hernia repair  ANESTHESIA:  General  SURGEON:  Rochel Brome M.D.  INDICATIONS: He reports soreness in the groin and did have physical findings of bilateral inguinal hernias. Surgery was recommended for definitive treatment.  With the patient on the operating table in the supine position the bilateral lower quadrant was prepared with clippers and with ChloraPrep and draped in a sterile manner. A transversely oriented suprapubic incision was made on the right side and carried down through subcutaneous tissues. Electrocautery was used for hemostasis. The Scarpa's fascia was incised. The external oblique aponeurosis was incised along the course of its fibers to open the external ring and expose the inguinal cord structures. The cord structures were mobilized. A Penrose drain was passed around the cord structures for traction. There was a finding of a direct inguinal hernia. The cord structures were examined and there was no indirect sac. The direct inguinal hernia extended from the pubic tubercle up to the internal ring. This was repaired by inverting the sac into the abdominal cavity and carried down to the repair with 0 Surgilon sutures suturing the conjoined tendon to the Cooper's ligament and the shelving edge of the inguinal ligament incorporating transversalis fascia into the repair. A relaxing incision was made medially. Bard soft mesh was cut to create an oval shape and was placed over the repair. This was sutured to the repair with interrupted 0 Surgilon sutures and also sutured medially to the deep fascia and on both sides of the internal ring. Next after seeing hemostasis was intact the cord structures were replaced along the floor of the inguinal canal. The cut edges of the external oblique aponeurosis were closed with a running 4-0 Vicryl  suture to re-create the external ring. The deep fascia superior and lateral to the repair site was infiltrated with half percent Sensorcaine with epinephrine. Subcutaneous tissues were also infiltrated. The Scarpa's fascia was closed with interrupted 4-0 Vicryl sutures. The skin was closed with running 4-0 Monocryl subcuticular suture.  A very similar operation was carried out on the left side with a mirror image incision. One traversing vein was divided between 4-0 chromic suture ligatures. The incision was carried down to open the external ring and expose the inguinal cord structures which were examined. There was no indirect sac. There was a similar direct inguinal hernia which was repaired in a similar manner and also used onlay Bard soft mesh. Half percent Sensorcaine with epinephrine was used in a similar manner. The external oblique was closed with 4-0 Vicryl. Scarpa's fascia was closed with 4-0 Vicryl. The skin was closed with running 4-0 Monocryl subcuticular suture.  The skin on both sides was then treated with LiquiBand and allowed to dry. The patient appeared to be in satisfactory condition and was prepared for transfer to the recovery room.  Rochel Brome M.D.

## 2016-01-23 NOTE — H&P (Signed)
Willie Clark is an 65 y.o. male.   Chief Complaint: Soreness in the groin on both sides HPI: He recently was evaluated in the office with a known history of left inguinal hernia having some soreness now also on the right side. He had physical findings of bilateral inguinal hernias and repair was recommended for definitive treatment  Past Medical History:  Diagnosis Date  . Anxiety   . Arthritis   . Barrett's esophagus   . Bowel obstruction 02/2009   small bowel  . CHF (congestive heart failure) ([place])   . Chronic chest pain   . Coronary artery disease 1993   MI   . Depression   . Diverticulitis   . Fibromyalgia   . Gastritis   . GERD (gastroesophageal reflux disease)   . Headache   . Hyperlipidemia   . Hypertension   . Kidney stones   . Lightheadedness   . Mass on back   . MI (myocardial infarction)   . OSA (obstructive sleep apnea)   . Pulmonary embolism ([place]) 01/2006   Post-op/treated  . Skin lesions, generalized   . Sleep apnea    C-PAP  . SOB (shortness of breath)   . Stroke ([place])   . Syncope and collapse   . Urticaria     Past Surgical History:  Procedure Laterality Date  . BACK SURGERY    . bowel obstruction  01/2009  . CARDIAC CATHETERIZATION  11/2006   patent grafts. Significant OM3 disease and occluded diagonals.  [name] CARDIAC CATHETERIZATION  06/2010   patent grafts. significant ISR in proximal LCX but OM2 is bypassed and gives retrograde flow to OM3.   [name] CARDIAC CATHETERIZATION  06/2013   [facility]: Patent grafts with 60% proximal in-stent restenosis in the left circumflex with FFR of 0.85  . CARDIAC CATHETERIZATION Left 09/25/2015   Procedure: Left Heart Cath and Cors/Grafts Angiography;  Surgeon: Wellington Hampshire, MD;  Location: [place] [facility];  Service: Cardiovascular;  Laterality: Left;  . CERVICAL FUSION    . CHOLECYSTECTOMY    . CORONARY ANGIOPLASTY    . CORONARY ARTERY BYPASS GRAFT  1993/01/2006   redo at [facility]. LIMA to LAD, SVG to OM2 and SVG to RPDA   . CYSTOSCOPY WITH STENT PLACEMENT Bilateral 09/24/2015   Procedure: CYSTOSCOPY WITH BILATERAL RETROGRADES, BILATERAL STENT PLACEMENT;  Surgeon: Festus Aloe, MD;  Location: [facility] ORS;  Service: Urology;  Laterality: Bilateral;  . ESOPHAGOGASTRODUODENOSCOPY N/A 10/25/2014   Procedure: ESOPHAGOGASTRODUODENOSCOPY (EGD);  Surgeon: Lollie Sails, MD;  Location: [facility] END[facility]COPY;  Service: Endoscopy;  Laterality: N/A;  . EYE SURGERY Bilateral    Cataract Extraction with IOL  . NECK SURGERY  06/2009  . SHOULDER SURGERY  2010    Family History  Problem Relation Age of Onset  . Heart disease Father 51  . Heart attack Father    Social History:  reports that he quit smoking about 24 years ago. His smoking use included Cigarettes. He smoked 2.50 packs per day. He has never used smokeless tobacco. He reports that he drinks alcohol. He reports that he does not use drugs.  Allergies:  Allergies  Allergen Reactions  . Amitriptyline     Pt unsure of what happens  . [employer]   . Cyclobenzaprine     Pt unsure what hapens  . Fish Oil Itching  . Lidocaine Nausea And Vomiting  . Other Diarrhea    Reactive agents: Onions, bacon    Medications Prior to Admission  Medication Sig  Dispense Refill  . acetaminophen (TYLENOL) 500 MG tablet Take 500 mg by mouth every 8 (eight) hours as needed for mild pain.     [name] aspirin EC 81 MG tablet Take 81 mg by mouth daily.    . citalopram (CELEXA) 20 MG tablet Take 20 mg by mouth daily.     [name] docusate sodium (COLACE) 100 MG capsule Take 100 mg by mouth 2 (two) times daily.    . enalapril (VASOTEC) 20 MG tablet TAKE 1/2 TABLET  (10MG ) EVERY DAY 45 tablet 0  . fexofenadine (ALLEGRA) 180 MG tablet Take 180 mg by mouth daily.    [name] FIBER PO Take 1 tablet by mouth daily.     . finasteride (PROSCAR) 5 MG tablet Take 5 mg by mouth daily.    . furosemide (LASIX) 40 MG tablet Take 1 tablet (40 mg total) by mouth daily as needed. (Patient taking differently: Take 40 mg  by mouth daily as needed for fluid. ) 30 tablet 3  . gabapentin (NEURONTIN) 800 MG tablet Take 800 mg by mouth 3 (three) times daily.    . metoprolol succinate (TOPROL-XL) 25 MG 24 hr tablet Take 25 mg by mouth daily.    . Multiple Vitamins-Minerals (HAIR/SKIN/NAILS PO) Take 1 tablet by mouth 3 (three) times daily.     . nitroGLYCERIN (NITROSTAT) 0.4 MG SL tablet Place 0.4 mg under the tongue every 5 (five) minutes as needed for chest pain.     . nortriptyline (PAMELOR) 25 MG capsule Take 25 mg by mouth at bedtime.     . pantoprazole (PROTONIX) 40 MG tablet Take 40 mg by mouth 2 (two) times daily.     . potassium chloride SA (K-DUR,KLOR-CON) 20 MEQ tablet Take 20 mEq by mouth daily.    . rosuvastatin (CRESTOR) 20 MG tablet Take 20 mg by mouth at bedtime.     . Simethicone (GAS-X PO) Take 1 tablet by mouth as needed (gas).     . sucralfate (CARAFATE) 1 G tablet Take 1 g by mouth 4 (four) times daily.     . tamsulosin (FLOMAX) 0.4 MG CAPS capsule Take 0.4 mg by mouth daily.    [name] topiramate (TOPAMAX) 50 MG tablet Take 50 mg by mouth 2 (two) times daily.     . vitamin B-12 (CYANOCOBALAMIN) 1000 MCG tablet Take 1,000 mcg by mouth daily.    [name] oxybutynin (DITROPAN) 5 MG tablet Take 1 tablet (5 mg total) by mouth 3 (three) times daily. (Patient not taking: Reported on 01/16/2016) 90 tablet 0    No results found for this or any previous visit (from the past 48 hour(s)). No results found.  ROS he reports he has had some recent mild diarrhea. He reports no recent acute illness such as cough cold or sore throat. He does have some chronic chest pain and has had recent cardiology clearance indicating satisfactory condition for surgery. He has had some recent fever and chills related to urinary infection which is subsequently resolved. Does have some blurred vision he has some occasional mild nausea but no vomiting. He reports some recent diarrhea. He reports his breathing satisfactorily. He reports headaches  dizziness depression and anxiety also some recent pain in the right testicle which is now improved.  Blood pressure 134/89, pulse 66, temperature 97.8 F (36.6 C), temperature source Oral, resp. rate 16, height 5' 9.5" (1.765 m), weight 88.9 kg (196 lb), SpO2 97 %. Physical Exam  GENERAL:  Awake alert and oriented and in no  acute distress.  HEENT:  Head is normocephalic.  Pupils are equal reactive to light.  Extraocular movements are intact. Sclera is clear.  Pharynx is clear.  NECK:  Supple with no palpable mass and no adenopathy.  HEART:  Regular rhythm S1-S2, without murmur.  LUNGS:  Clear without rales rhonchi or wheezes.  ABDOMEN: Soft flat and nontender. The hernias are currently reduced  NEUROLOGIC: Awake alert and oriented and moving all extremities  EXTREMITIES: With no dependent edema  Assessment/Plan Bilateral inguinal hernias  I discussed the plan for bilateral inguinal hernia repair  Rochel Brome, MD 01/23/2016, 7:07 AM

## 2016-01-23 NOTE — Transfer of Care (Signed)
Immediate Anesthesia Transfer of Care Note  Patient: Willie Clark  Procedure(s) Performed: Procedure(s): HERNIA REPAIR INGUINAL ADULT BILATERAL (Bilateral)  Patient Location: PACU  Anesthesia Type:General  Level of Consciousness: awake and patient cooperative  Airway & Oxygen Therapy: Patient Spontanous Breathing and Patient connected to face mask oxygen  Post-op Assessment: Report given to RN and Post -op Vital signs reviewed and stable  Post vital signs: Reviewed and stable  Last Vitals:  Vitals:   01/23/16 0545 01/23/16 0925  BP: 134/89 121/74  Pulse: 66 78  Resp: 16 (!) 8  Temp: 36.6 C 37.1 C    Last Pain:  Vitals:   01/23/16 0545  TempSrc: Oral  PainSc: 6          Complications: No apparent anesthesia complications

## 2016-01-23 NOTE — Anesthesia Procedure Notes (Signed)
Procedure Name: Intubation Date/Time: 01/23/2016 7:29 AM Performed by: Courtney Patricio Pre-anesthesia Checklist: Patient identified, Patient being monitored, Timeout performed, Emergency Drugs available and Suction available Patient Re-evaluated:Patient Re-evaluated prior to inductionOxygen Delivery Method: Circle system utilized Preoxygenation: Pre-oxygenation with 100% oxygen Intubation Type: IV induction and Combination inhalational/ intravenous induction Ventilation: Mask ventilation without difficulty Laryngoscope Size: 3 and Miller Grade View: Grade III Tube type: Oral Tube size: 7.5 mm Number of attempts: 1 Airway Equipment and Method: Stylet Placement Confirmation: ETT inserted through vocal cords under direct vision,  positive ETCO2 and breath sounds checked- equal and bilateral Secured at: 21 cm Tube secured with: Tape Dental Injury: Teeth and Oropharynx as per pre-operative assessment

## 2016-01-23 NOTE — Anesthesia Preprocedure Evaluation (Signed)
Anesthesia Evaluation  Patient identified by MRN, date of birth, ID band Patient awake    Reviewed: Allergy & Precautions, H&P , NPO status , Patient's Chart, lab work & pertinent test results  History of Anesthesia Complications Negative for: history of anesthetic complications  Airway Mallampati: III  TM Distance: <3 FB Neck ROM: limited    Dental no notable dental hx. (+) Poor Dentition, Chipped, Missing   Pulmonary shortness of breath and with exertion, sleep apnea , former smoker,    Pulmonary exam normal breath sounds clear to auscultation       Cardiovascular Exercise Tolerance: Good hypertension, + angina + CAD, + Past MI, + Peripheral Vascular Disease, +CHF and + DOE  Normal cardiovascular exam Rhythm:regular Rate:Normal     Neuro/Psych  Headaches, PSYCHIATRIC DISORDERS Anxiety Depression TIACVA, Residual Symptoms    GI/Hepatic Neg liver ROS, GERD  Controlled,  Endo/Other  negative endocrine ROS  Renal/GU Renal disease     Musculoskeletal  (+) Arthritis , Fibromyalgia -  Abdominal   Peds  Hematology negative hematology ROS (+)   Anesthesia Other Findings Past Medical History: No date: Anxiety No date: Arthritis No date: Barrett's esophagus 02/2009: Bowel obstruction     Comment: small bowel No date: CHF (congestive heart failure) ([facility]) No date: Chronic chest pain 1993: Coronary artery disease     Comment: MI  No date: Depression No date: Diverticulitis No date: Fibromyalgia No date: Gastritis No date: GERD (gastroesophageal reflux disease) No date: Headache No date: Hyperlipidemia No date: Hypertension No date: Kidney stones No date: Lightheadedness No date: Mass on back No date: MI (myocardial infarction) No date: OSA (obstructive sleep apnea) 01/2006: Pulmonary embolism ([facility])     Comment: Post-op/treated No date: Skin lesions, generalized No date: Sleep apnea     Comment: C-PAP No  date: SOB (shortness of breath) No date: Stroke [facility]) No date: Syncope and collapse No date: Urticaria  Past Surgical History: No date: BACK SURGERY 01/2009: bowel obstruction 11/2006: CARDIAC CATHETERIZATION     Comment: patent grafts. Significant OM3 disease and               occluded diagonals. 06/2010: CARDIAC CATHETERIZATION     Comment: patent grafts. significant ISR in proximal LCX              but OM2 is bypassed and gives retrograde flow               to OM3.  06/2013: CARDIAC CATHETERIZATION     Comment: [facility]: Patent grafts with 60% proximal in-stent              restenosis in the left circumflex with FFR of               0.85 09/25/2015: CARDIAC CATHETERIZATION Left     Comment: Procedure: Left Heart Cath and Cors/Grafts               Angiography;  Surgeon: Wellington Hampshire, MD;                Location: [place] CV LAB;  Service:               Cardiovascular;  Laterality: Left; No date: CERVICAL FUSION No date: CHOLECYSTECTOMY No date: CORONARY ANGIOPLASTY 1993/01/2006: CORONARY ARTERY BYPASS GRAFT     Comment: redo at [facility]. LIMA to LAD, SVG to OM2 and SVG               to RPDA 09/24/2015: CYSTOSCOPY WITH  STENT PLACEMENT Bilateral     Comment: Procedure: CYSTOSCOPY WITH BILATERAL               RETROGRADES, BILATERAL STENT PLACEMENT;                Surgeon: Festus Aloe, MD;  Location: [facility]               ORS;  Service: Urology;  Laterality: Bilateral; 10/25/2014: ESOPHAGOGASTRODUODENOSCOPY N/A     Comment: Procedure: ESOPHAGOGASTRODUODENOSCOPY (EGD);                Surgeon: Lollie Sails, MD;  Location: [facility]              ENDOSCOPY;  Service: Endoscopy;  Laterality:               N/A; No date: EYE SURGERY Bilateral     Comment: Cataract Extraction with IOL 06/2009: NECK SURGERY 2010: SHOULDER SURGERY  BMI    Body Mass Index:  28.53 kg/m      Reproductive/Obstetrics negative OB ROS                             Anesthesia  Physical Anesthesia Plan  ASA: IV  Anesthesia Plan: General ETT   Post-op Pain Management:    Induction:   Airway Management Planned:   Additional Equipment:   Intra-op Plan:   Post-operative Plan:   Informed Consent: I have reviewed the patients History and Physical, chart, labs and discussed the procedure including the risks, benefits and alternatives for the proposed anesthesia with the patient or authorized representative who has indicated his/her understanding and acceptance.     Plan Discussed with: Anesthesiologist, CRNA and Surgeon  Anesthesia Plan Comments: (Patient has cardiac clearance for this procedure.   Patient informed that they are higher risk for complications from anesthesia during this procedure due to their medical history.  Patient voiced understanding. )        Anesthesia Quick Evaluation

## 2016-01-29 ENCOUNTER — Encounter: Payer: Self-pay | Admitting: Surgery

## 2016-03-05 ENCOUNTER — Other Ambulatory Visit: Payer: Self-pay | Admitting: Cardiovascular Disease

## 2016-05-20 ENCOUNTER — Ambulatory Visit (INDEPENDENT_AMBULATORY_CARE_PROVIDER_SITE_OTHER): Payer: Medicare HMO | Admitting: Cardiovascular Disease

## 2016-05-20 ENCOUNTER — Encounter: Payer: Self-pay | Admitting: Cardiovascular Disease

## 2016-05-20 VITALS — BP 120/80 | HR 61 | Ht 69.0 in | Wt 198.5 lb

## 2016-05-20 DIAGNOSIS — I10 Essential (primary) hypertension: Secondary | ICD-10-CM | POA: Diagnosis not present

## 2016-05-20 DIAGNOSIS — I2510 Atherosclerotic heart disease of native coronary artery without angina pectoris: Secondary | ICD-10-CM | POA: Diagnosis not present

## 2016-05-20 DIAGNOSIS — E785 Hyperlipidemia, unspecified: Secondary | ICD-10-CM

## 2016-05-20 NOTE — Patient Instructions (Signed)
Medication Instructions: Continue same medications.   Labwork: None.   Procedures/Testing: None.   Follow-Up: 6 months with Dr. Nohelani Benning.   Any Additional Special Instructions Will Be Listed Below (If Applicable).     If you need a refill on your cardiac medications before your next appointment, please call your pharmacy.   

## 2016-05-20 NOTE — Progress Notes (Signed)
Cardiology Office Note   Date:  05/20/2016   ID:  Willie Clark, DOB 1950-10-16, MRN [id number]  PCP:  [name], MD  Cardiologist:   [name], MD   Chief Complaint  Patient presents with  . other    6 month follow up. meds reviewed by the pt. verbally. "doing well."       History of Present Illness: Willie Clark is a 66 y.o. male who presents for  a followup visit. He has known history of coronary artery disease. He is status post coronary artery bypass graft surgery in 1993 and redo in 2007.   He suffers from fibromyalgia and chronic pain.  He also has chronic systolic heart failure with mildly reduced LV systolic function. He had GI symptoms in 2015 due to documented gastric ulcer. This improved with treatment. He had neck surgery in [id number] without complications.  Most recent nuclear stress test in May 2017 showed evidence of anterolateral ischemia which is not different from before. However, there was a new area of anteroseptal ischemia. LV systolic function was mildly reduced and the study was overall moderate risk. Cardiac catheterization in June which showed significant underlying three-vessel disease with patent grafts and no change in coronary anatomy since most recent cardiac catheterization. Ejection fraction was 50-55% with normal left ventricular end-diastolic pressure. He was hospitalized in June for nephrolithiasis. He underwent hernia repair in October 2017. He has been doing reasonably well overall. He describes few episodes of substernal and epigastric heartburn at rest. He also had increased episodes of dizziness described as lightheadedness associated with tinnitus. No vertigo. No syncope.  Past Medical History:  Diagnosis Date  . Anxiety   . Arthritis   . Barrett's esophagus   . Bowel obstruction 02/2009   small bowel  . CHF (congestive heart failure) ([place])   . Chronic chest pain   . Coronary artery disease 1993   MI   . Depression   .  Difficult intubation   . Diverticulitis   . Fibromyalgia   . Gastritis   . GERD (gastroesophageal reflux disease)   . Headache   . Hyperlipidemia   . Hypertension   . Kidney stones   . Lightheadedness   . Mass on back   . MI (myocardial infarction)   . OSA (obstructive sleep apnea)   . Pulmonary embolism ([place]) 01/2006   Post-op/treated  . Skin lesions, generalized   . Sleep apnea    C-PAP  . SOB (shortness of breath)   . Stroke ([place])   . Syncope and collapse   . Urticaria     Past Surgical History:  Procedure Laterality Date  . BACK SURGERY    . bowel obstruction  01/2009  . CARDIAC CATHETERIZATION  11/2006   patent grafts. Significant OM3 disease and occluded diagonals.  [name] CARDIAC CATHETERIZATION  06/2010   patent grafts. significant ISR in proximal LCX but OM2 is bypassed and gives retrograde flow to OM3.   [name] CARDIAC CATHETERIZATION  06/2013   [facility]: Patent grafts with 60% proximal in-stent restenosis in the left circumflex with FFR of 0.85  . CARDIAC CATHETERIZATION Left 09/25/2015   Procedure: Left Heart Cath and Cors/Grafts Angiography;  Surgeon: Wellington Hampshire, MD;  Location: [place] [facility];  Service: Cardiovascular;  Laterality: Left;  . CERVICAL FUSION    . CHOLECYSTECTOMY    . CORONARY ANGIOPLASTY    . CORONARY ARTERY BYPASS GRAFT  1993/01/2006   redo at [facility]. LIMA to  LAD, SVG to OM2 and SVG to RPDA  . CYSTOSCOPY WITH STENT PLACEMENT Bilateral 09/24/2015   Procedure: CYSTOSCOPY WITH BILATERAL RETROGRADES, BILATERAL STENT PLACEMENT;  Surgeon: Festus Aloe, MD;  Location: [facility] ORS;  Service: Urology;  Laterality: Bilateral;  . ESOPHAGOGASTRODUODENOSCOPY N/A 10/25/2014   Procedure: ESOPHAGOGASTRODUODENOSCOPY (EGD);  Surgeon: Lollie Sails, MD;  Location: [facility] ENDOSCOPY;  Service: Endoscopy;  Laterality: N/A;  . EYE SURGERY Bilateral    Cataract Extraction with IOL  . INGUINAL HERNIA REPAIR Bilateral 01/23/2016   Procedure: HERNIA REPAIR INGUINAL ADULT  BILATERAL;  Surgeon: Leonie Green, MD;  Location: [facility] ORS;  Service: General;  Laterality: Bilateral;  . NECK SURGERY  06/2009  . SHOULDER SURGERY  2010     Current Outpatient Prescriptions  Medication Sig Dispense Refill  . acetaminophen (TYLENOL) 500 MG tablet Take 500 mg by mouth every 8 (eight) hours as needed for mild pain.     [name] aspirin EC 81 MG tablet Take 81 mg by mouth daily.    . citalopram (CELEXA) 20 MG tablet Take 20 mg by mouth daily.     [name] docusate sodium (COLACE) 100 MG capsule Take 100 mg by mouth 2 (two) times daily.    . enalapril (VASOTEC) 20 MG tablet TAKE 1/2 TABLET  (10MG ) EVERY DAY 45 tablet 3  . fexofenadine (ALLEGRA) 180 MG tablet Take 180 mg by mouth daily.    [name] FIBER PO Take 1 tablet by mouth daily.     . finasteride (PROSCAR) 5 MG tablet Take 5 mg by mouth daily.    . furosemide (LASIX) 40 MG tablet Take 1 tablet (40 mg total) by mouth daily as needed. (Patient taking differently: Take 40 mg by mouth daily as needed for fluid. ) 30 tablet 3  . gabapentin (NEURONTIN) 800 MG tablet Take 800 mg by mouth 3 (three) times daily.    [name] HYDROcodone-acetaminophen (NORCO) 5-325 MG tablet Take 1-2 tablets by mouth every 4 (four) hours as needed for moderate pain. 16 tablet 0  . metoprolol succinate (TOPROL-XL) 25 MG 24 hr tablet Take 25 mg by mouth daily.    . Multiple Vitamins-Minerals (HAIR/SKIN/NAILS PO) Take 1 tablet by mouth 3 (three) times daily.     . nitroGLYCERIN (NITROSTAT) 0.4 MG SL tablet Place 0.4 mg under the tongue every 5 (five) minutes as needed for chest pain.     . nortriptyline (PAMELOR) 25 MG capsule Take 25 mg by mouth at bedtime.     [name] oxybutynin (DITROPAN) 5 MG tablet Take 1 tablet (5 mg total) by mouth 3 (three) times daily. 90 tablet 0  . pantoprazole (PROTONIX) 40 MG tablet Take 40 mg by mouth 2 (two) times daily.     . potassium chloride SA (K-DUR,KLOR-CON) 20 MEQ tablet Take 20 mEq by mouth daily.    . rosuvastatin (CRESTOR) 20 MG tablet  Take 20 mg by mouth at bedtime.     . Simethicone (GAS-X PO) Take 1 tablet by mouth as needed (gas).     . sucralfate (CARAFATE) 1 G tablet Take 1 g by mouth 4 (four) times daily.     . tamsulosin (FLOMAX) 0.4 MG CAPS capsule Take 0.4 mg by mouth daily.    [name] topiramate (TOPAMAX) 50 MG tablet Take 50 mg by mouth 2 (two) times daily.     . vitamin B-12 (CYANOCOBALAMIN) 1000 MCG tablet Take 1,000 mcg by mouth daily.     No current facility-administered medications for this visit.  Allergies:   Amitriptyline; Berniece Salines flavor; Cyclobenzaprine; Fish oil; Lidocaine; and Other    Social History:  The patient  reports that he quit smoking about 24 years ago. His smoking use included Cigarettes. He smoked 2.50 packs per day. He has never used smokeless tobacco. He reports that he drinks alcohol. He reports that he does not use drugs.   Family History:  The patient's family history includes Heart attack in his father; Heart disease (age of onset: 63) in his father.    ROS:  Please see the history of present illness.   Otherwise, review of systems are positive for none.   All other systems are reviewed and negative.    PHYSICAL EXAM: VS:  BP 120/80 (BP Location: Left Arm, Patient Position: Sitting, Cuff Size: Normal)   Pulse 61   Ht 5\' 9"  (1.753 m)   Wt 198 lb 8 oz (90 kg)   BMI 29.31 kg/m  , BMI Body mass index is 29.31 kg/m. GEN: Well nourished, well developed, in no acute distress  HEENT: normal  Neck: no JVD, carotid bruits, or masses Cardiac: RRR; no murmurs, rubs, or gallops,no edema  Respiratory:  clear to auscultation bilaterally, normal work of breathing GI: soft, nontender, nondistended, + BS MS: no deformity or atrophy  Skin: warm and dry, no rash Neuro:  Strength and sensation are intact Psych: euthymic mood, full affect   EKG:  EKG is  ordered today. EKG showed normal sinus rhythm with nonspecific T wave changes.   Recent Labs: 09/24/2015: TSH 4.033 01/16/2016: BUN 10;  Creatinine, Ser 1.01; Hemoglobin 14.0; Platelets 162; Potassium 4.3; Sodium 138    Lipid Panel No results found for: CHOL, TRIG, HDL, CHOLHDL, VLDL, LDLCALC, LDLDIRECT    Wt Readings from Last 3 Encounters:  05/20/16 198 lb 8 oz (90 kg)  01/23/16 196 lb (88.9 kg)  01/16/16 192 lb (87.1 kg)         ASSESSMENT AND PLAN:  1.  Coronary artery disease involving native coronary arteries with Chronic angina:  Cardiac catheterization in June showed patent grafts and no change from before.  He is doing reasonably well overall with no convincing symptoms of angina. I don't think his heartburn is related to cardiac etiology. If it persists, consider GI evaluation.  2. Essential hypertension: Blood pressure is well controlled on current medications.  3. Chronic venous insufficiency: BNP was normal. He takes furosemide as needed.  4. Hyperlipidemia: Continue current dose of rosuvastatin with a target LDL of less than 70.  5. Dizziness: Doubt cardiac etiology. Borderline bradycardia but that's a chronic finding. Symptoms are associated with tinnitus  which might indicate an inner ear problem.   Disposition:   FU with me in 6 months  Signed,  [name], MD  05/20/2016 1:27 PM    [facility]

## 2016-05-27 ENCOUNTER — Other Ambulatory Visit: Payer: Self-pay | Admitting: Gastroenterology

## 2016-05-27 DIAGNOSIS — K760 Fatty (change of) liver, not elsewhere classified: Secondary | ICD-10-CM

## 2016-06-03 ENCOUNTER — Ambulatory Visit
Admission: RE | Admit: 2016-06-03 | Discharge: 2016-06-03 | Disposition: A | Discharge status: Home / Self Care | Payer: Medicare HMO | Source: Ambulatory Visit | Attending: Gastroenterology | Admitting: Gastroenterology

## 2016-06-03 DIAGNOSIS — K760 Fatty (change of) liver, not elsewhere classified: Secondary | ICD-10-CM | POA: Diagnosis present

## 2016-06-03 DIAGNOSIS — Z9049 Acquired absence of other specified parts of digestive tract: Secondary | ICD-10-CM | POA: Insufficient documentation

## 2016-06-03 DIAGNOSIS — R7989 Other specified abnormal findings of blood chemistry: Secondary | ICD-10-CM | POA: Insufficient documentation

## 2016-06-24 ENCOUNTER — Other Ambulatory Visit: Payer: Self-pay | Admitting: Unknown Physician Specialty

## 2016-06-24 DIAGNOSIS — H5711 Ocular pain, right eye: Secondary | ICD-10-CM

## 2016-06-25 ENCOUNTER — Other Ambulatory Visit: Payer: Self-pay | Admitting: Unknown Physician Specialty

## 2016-06-26 ENCOUNTER — Other Ambulatory Visit: Payer: Self-pay | Admitting: Unknown Physician Specialty

## 2016-06-26 DIAGNOSIS — H5711 Ocular pain, right eye: Secondary | ICD-10-CM

## 2016-07-05 ENCOUNTER — Ambulatory Visit
Admission: RE | Admit: 2016-07-05 | Discharge: 2016-07-05 | Disposition: A | Discharge status: Home / Self Care | Payer: Medicare HMO | Source: Ambulatory Visit | Attending: Unknown Physician Specialty | Admitting: Unknown Physician Specialty

## 2016-07-05 ENCOUNTER — Ambulatory Visit: Payer: Medicare HMO

## 2016-07-05 DIAGNOSIS — H5711 Ocular pain, right eye: Secondary | ICD-10-CM | POA: Diagnosis present

## 2016-07-05 DIAGNOSIS — H538 Other visual disturbances: Secondary | ICD-10-CM | POA: Insufficient documentation

## 2016-07-05 MED ORDER — GADOBENATE DIMEGLUMINE 529 MG/ML IV SOLN
15.0000 mL | Freq: Once | INTRAVENOUS | Status: AC | PRN
  Administered 2016-07-05: 15 mL via INTRAVENOUS

## 2016-09-01 ENCOUNTER — Encounter: Payer: Self-pay | Admitting: Emergency Medicine

## 2016-09-01 ENCOUNTER — Emergency Department
Admission: EM | Admit: 2016-09-01 | Discharge: 2016-09-01 | Disposition: A | Discharge status: Home / Self Care | Payer: Medicare HMO | Attending: Emergency Medicine | Admitting: Emergency Medicine

## 2016-09-01 DIAGNOSIS — Z9861 Coronary angioplasty status: Secondary | ICD-10-CM | POA: Diagnosis not present

## 2016-09-01 DIAGNOSIS — I2510 Atherosclerotic heart disease of native coronary artery without angina pectoris: Secondary | ICD-10-CM | POA: Insufficient documentation

## 2016-09-01 DIAGNOSIS — I252 Old myocardial infarction: Secondary | ICD-10-CM | POA: Insufficient documentation

## 2016-09-01 DIAGNOSIS — E860 Dehydration: Secondary | ICD-10-CM | POA: Insufficient documentation

## 2016-09-01 DIAGNOSIS — Z8673 Personal history of transient ischemic attack (TIA), and cerebral infarction without residual deficits: Secondary | ICD-10-CM | POA: Insufficient documentation

## 2016-09-01 DIAGNOSIS — I5042 Chronic combined systolic (congestive) and diastolic (congestive) heart failure: Secondary | ICD-10-CM | POA: Insufficient documentation

## 2016-09-01 DIAGNOSIS — Z951 Presence of aortocoronary bypass graft: Secondary | ICD-10-CM | POA: Insufficient documentation

## 2016-09-01 DIAGNOSIS — I110 Hypertensive heart disease with heart failure: Secondary | ICD-10-CM | POA: Diagnosis not present

## 2016-09-01 DIAGNOSIS — R110 Nausea: Secondary | ICD-10-CM | POA: Diagnosis present

## 2016-09-01 DIAGNOSIS — R42 Dizziness and giddiness: Secondary | ICD-10-CM | POA: Insufficient documentation

## 2016-09-01 DIAGNOSIS — Z87891 Personal history of nicotine dependence: Secondary | ICD-10-CM | POA: Diagnosis not present

## 2016-09-01 DIAGNOSIS — Z7982 Long term (current) use of aspirin: Secondary | ICD-10-CM | POA: Diagnosis not present

## 2016-09-01 LAB — BASIC METABOLIC PANEL
ANION GAP: 5 (ref 5–15)
BUN: 11 mg/dL (ref 6–20)
CHLORIDE: 105 mmol/L (ref 101–111)
CO2: 28 mmol/L (ref 22–32)
Calcium: 8.6 mg/dL — ABNORMAL LOW (ref 8.9–10.3)
Creatinine, Ser: 1.03 mg/dL (ref 0.61–1.24)
GFR calc Af Amer: >60 mL/min (ref >60)
GLUCOSE: 151 mg/dL — AB (ref 65–99)
POTASSIUM: 3.7 mmol/L (ref 3.5–5.1)
Sodium: 138 mmol/L (ref 135–145)

## 2016-09-01 LAB — CBC WITH DIFFERENTIAL/PLATELET
BASOS ABS: 0 10*3/uL (ref 0–0.1)
Basophils Relative: 0 %
Eosinophils Absolute: 0.1 10*3/uL (ref 0–0.7)
Eosinophils Relative: 1 %
HEMATOCRIT: 40.3 % (ref 40.0–52.0)
Hemoglobin: 13.9 g/dL (ref 13.0–18.0)
LYMPHS ABS: 0.7 10*3/uL — AB (ref 1.0–3.6)
LYMPHS PCT: 8 %
MCH: 32.9 pg (ref 26.0–34.0)
MCHC: 34.6 g/dL (ref 32.0–36.0)
MCV: 95.2 fL (ref 80.0–100.0)
MONO ABS: 0.4 10*3/uL (ref 0.2–1.0)
Monocytes Relative: 4 %
NEUTROS ABS: 8.1 10*3/uL — AB (ref 1.4–6.5)
Neutrophils Relative %: 87 %
Platelets: 152 10*3/uL (ref 150–440)
RBC: 4.24 MIL/uL — ABNORMAL LOW (ref 4.40–5.90)
RDW: 13.7 % (ref 11.5–14.5)
WBC: 9.3 10*3/uL (ref 3.8–10.6)

## 2016-09-01 LAB — TROPONIN I: Troponin I: <0.03 ng/mL (ref <0.03)

## 2016-09-01 MED ORDER — SODIUM CHLORIDE 0.9 % IV BOLUS (SEPSIS)
1000.0000 mL | Freq: Once | INTRAVENOUS | Status: AC
  Administered 2016-09-01: 1000 mL via INTRAVENOUS

## 2016-09-01 NOTE — ED Triage Notes (Signed)
Pt here from home via EMS with c/o feeling nauseated and dizzy this am in church, came home, and had another episode again at home with diaphoresis. Pt states he started a new pain med on Friday, unable to recall the name of the med. Now, denies pain, denies dizziness, however, slightly nauseated. 450cc fluid and zofran given per EMS. Pt alert and oriented.

## 2016-09-01 NOTE — ED Provider Notes (Signed)
[facility] Emergency Department Provider Note  ____________________________________________  Time seen: Approximately 4:56 PM  I have reviewed the triage vital signs and the nursing notes.   HISTORY  Chief Complaint Nausea and Hypotension    HPI Willie Clark is a 66 y.o. male who reports feeling nauseated and lightheaded in church today. He denies any pain such as chest pain shortness of breath back pain or abdominal pain. No headache numbness tingling or weakness. He reports lightheadedness with standing for the past 2 weeks. He is been told by his doctor to decrease his Lasix and drink more water he's been trying to comply with this advice, but his symptoms have not improved. Today he was sitting in church and noticed that it felt very hot. He began sweating and got real lightheaded. He drove home and laid in the air conditioning under a fan and felt better, but still feels dizzy when he stands up. His blood pressure home was measured to be about 65/40. He is given about 500 mL saline by EMS prior to arrival.  Dizziness is intermittent, worse with standing, better with sitting or lying. Moderate intensity when present     Past Medical History:  Diagnosis Date  . Anxiety   . Arthritis   . Barrett's esophagus   . Bowel obstruction ([place]) 02/2009   small bowel  . CHF (congestive heart failure) ([place])   . Chronic chest pain   . Coronary artery disease 1993   MI   . Depression   . Difficult intubation   . Diverticulitis   . Fibromyalgia   . Gastritis   . GERD (gastroesophageal reflux disease)   . Headache   . Hyperlipidemia   . Hypertension   . Kidney stones   . Lightheadedness   . Mass on back   . MI (myocardial infarction) ([place])   . OSA (obstructive sleep apnea)   . Pulmonary embolism ([place]) 01/2006   Post-op/treated  . Skin lesions, generalized   . Sleep apnea    C-PAP  . SOB (shortness of breath)   . Stroke ([place])   . Syncope and collapse    . Urticaria      Patient Active Problem List   Diagnosis Date Noted  . Kidney stones   . Ureteral obstruction   . Obstructive uropathy 09/24/2015  . Abnormal stress test   . 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 Chronic combined systolic and diastolic congestive heart failure ([place])   . Essential hypertension   . Preoperative cardiovascular examination   . Bilateral leg edema 01/10/2015  . TIA (transient ischemic attack) 08/13/2014  . Intermittent claudication ([place]) 06/07/2013  . Abdominal pain 05/16/2012  . Dyspnea 11/21/2011  . Sinus bradycardia 07/12/2010  . ED (erectile dysfunction) 07/12/2010  . Coronary artery disease   . Hypertension   . Hyperlipidemia      Past Surgical History:  Procedure Laterality Date  . BACK SURGERY    . bowel obstruction  01/2009  . CARDIAC CATHETERIZATION  11/2006   patent grafts. Significant OM3 disease and occluded diagonals.  [name] CARDIAC CATHETERIZATION  06/2010   patent grafts. significant ISR in proximal LCX but OM2 is bypassed and gives retrograde flow to OM3.   [name] CARDIAC CATHETERIZATION  06/2013   [facility]: Patent grafts with 60% proximal in-stent restenosis in the left circumflex with FFR of 0.85  . CARDIAC CATHETERIZATION Left 09/25/2015   Procedure: Left Heart Cath and Cors/Grafts Angiography;  Surgeon:  Wellington Hampshire, MD;  Location: [place] [facility];  Service: Cardiovascular;  Laterality: Left;  . CERVICAL FUSION    . CHOLECYSTECTOMY    . CORONARY ANGIOPLASTY    . CORONARY ARTERY BYPASS GRAFT  1993/01/2006   redo at [facility]. LIMA to LAD, SVG to OM2 and SVG to RPDA  . CYSTOSCOPY WITH STENT PLACEMENT Bilateral 09/24/2015   Procedure: CYSTOSCOPY WITH BILATERAL RETROGRADES, BILATERAL STENT PLACEMENT;  Surgeon: Festus Aloe, MD;  Location: [facility] ORS;  Service: Urology;  Laterality: Bilateral;  . ESOPHAGOGASTRODUODENOSCOPY N/A 10/25/2014   Procedure: ESOPHAGOGASTRODUODENOSCOPY (EGD);  Surgeon: Lollie Sails, MD;  Location: [facility] ENDOSCOPY;  Service: Endoscopy;  Laterality: N/A;  . EYE SURGERY Bilateral    Cataract Extraction with IOL  . INGUINAL HERNIA REPAIR Bilateral 01/23/2016   Procedure: HERNIA REPAIR INGUINAL ADULT BILATERAL;  Surgeon: Leonie Green, MD;  Location: [facility] ORS;  Service: General;  Laterality: Bilateral;  . NECK SURGERY  06/2009  . SHOULDER SURGERY  2010     Prior to Admission medications   Medication Sig Start Date End Date Taking? Authorizing Provider  acetaminophen (TYLENOL) 500 MG tablet Take 500 mg by mouth every 8 (eight) hours as needed for mild pain.  06/09/12   [provider]  aspirin EC 81 MG tablet Take 81 mg by mouth daily.    [provider]  citalopram (CELEXA) 20 MG tablet Take 20 mg by mouth daily.     [provider]  docusate sodium (COLACE) 100 MG capsule Take 100 mg by mouth 2 (two) times daily.    [provider]  enalapril (VASOTEC) 20 MG tablet TAKE 1/2 TABLET  (10MG ) EVERY DAY 03/05/16   Wellington Hampshire, MD  fexofenadine (ALLEGRA) 180 MG tablet Take 180 mg by mouth daily.    [provider]  FIBER PO Take 1 tablet by mouth daily.     [provider]  finasteride (PROSCAR) 5 MG tablet Take 5 mg by mouth daily.    [provider]  furosemide (LASIX) 40 MG tablet Take 1 tablet (40 mg total) by mouth daily as needed. Patient taking differently: Take 40 mg by mouth daily as needed for fluid.  11/25/11   Wellington Hampshire, MD  gabapentin (NEURONTIN) 800 MG tablet Take 800 mg by mouth 3 (three) times daily.    [provider]  HYDROcodone-acetaminophen (NORCO) 5-325 MG tablet Take 1-2 tablets by mouth every 4 (four) hours as needed for moderate pain. 01/23/16   Leonie Green, MD  metoprolol succinate (TOPROL-XL) 25 MG 24 hr tablet Take 25 mg by mouth daily.    [provider]  Multiple Vitamins-Minerals (HAIR/SKIN/NAILS PO) Take 1 tablet by mouth 3 (three) times  daily.     [provider]  nitroGLYCERIN (NITROSTAT) 0.4 MG SL tablet Place 0.4 mg under the tongue every 5 (five) minutes as needed for chest pain.     [provider]  nortriptyline (PAMELOR) 25 MG capsule Take 25 mg by mouth at bedtime.  07/25/14   [provider]  oxybutynin (DITROPAN) 5 MG tablet Take 1 tablet (5 mg total) by mouth 3 (three) times daily. 10/02/15   Hollice Espy, MD  pantoprazole (PROTONIX) 40 MG tablet Take 40 mg by mouth 2 (two) times daily.     [provider]  potassium chloride SA (K-DUR,KLOR-CON) 20 MEQ tablet Take 20 mEq by mouth daily.    [provider]  rosuvastatin (CRESTOR) 20 MG tablet Take 20  mg by mouth at bedtime.     [provider]  Simethicone (GAS-X PO) Take 1 tablet by mouth as needed (gas).     [provider]  sucralfate (CARAFATE) 1 G tablet Take 1 g by mouth 4 (four) times daily.  06/09/12   [provider]  tamsulosin (FLOMAX) 0.4 MG CAPS capsule Take 0.4 mg by mouth daily.    [provider]  topiramate (TOPAMAX) 50 MG tablet Take 50 mg by mouth 2 (two) times daily.  02/09/13   [provider]  vitamin B-12 (CYANOCOBALAMIN) 1000 MCG tablet Take 1,000 mcg by mouth daily.    [provider]     Allergies Amitriptyline; Berniece Salines flavor; Cyclobenzaprine; Fish oil; Lidocaine; and Other   Family History  Problem Relation Age of Onset  . Heart disease Father 83  . Heart attack Father     Social History Social History  Substance Use Topics  . Smoking status: Former Smoker    Packs/day: 2.50    Types: Cigarettes    Quit date: 08/21/1991  . Smokeless tobacco: Never Used  . Alcohol use Yes     Comment: occassional    Review of Systems  Constitutional:   No fever or chills.  ENT:   No sore throat. No rhinorrhea. Lymphatic: No swollen glands, No extremity swelling Endocrine: No hot/cold flashes. No significant weight change. No neck  swelling. Cardiovascular:   No chest pain or syncope. Respiratory:   No dyspnea or cough. Gastrointestinal:   Negative for abdominal pain, vomiting and diarrhea.  Genitourinary:   Negative for dysuria or difficulty urinating. Musculoskeletal:   Negative for focal pain or swelling Neurological:   Negative for headaches or weakness.Orthostatic symptoms as above All other systems reviewed and are negative except as documented above in ROS and HPI.  ____________________________________________   PHYSICAL EXAM:  VITAL SIGNS: ED Triage Vitals [09/01/16 1623]  Enc Vitals Group     BP 97/71     Pulse Rate (!) 57     Resp 18     Temp 97.9 F (36.6 C)     Temp Source Oral     SpO2 100 %     Weight 192 lb (87.1 kg)     Height 5\' 9"  (1.753 m)     Head Circumference      Peak Flow      Pain Score      Pain Loc      Pain Edu?      Excl. in [place]?     Vital signs reviewed, nursing assessments reviewed.   Constitutional:   Alert and oriented. Well appearing and in no distress. Eyes:   No scleral icterus. No conjunctival pallor. PERRL. EOMI.  No nystagmus. ENT   Head:   Normocephalic and atraumatic.   Nose:   No congestion/rhinnorhea. No septal hematoma   Mouth/Throat:   MMM, no pharyngeal erythema. No peritonsillar mass.    Neck:   No stridor. No SubQ emphysema. No meningismus. Hematological/Lymphatic/Immunilogical:   No cervical lymphadenopathy. Cardiovascular:   RRR. Symmetric bilateral radial and DP pulses.  No murmurs.  Respiratory:   Normal respiratory effort without tachypnea nor retractions. Breath sounds are clear and equal bilaterally. No wheezes/rales/rhonchi. Gastrointestinal:   Soft and nontender. Non distended. There is no CVA tenderness.  No rebound, rigidity, or guarding. Genitourinary:   deferred Musculoskeletal:   Normal range of motion in all extremities. No joint effusions.  No lower extremity tenderness.  No edema. Neurologic:  Normal speech and  language.  CN 2-10 normal. Motor grossly intact. No gross focal neurologic deficits are appreciated.  Skin:    Skin is warm, dry and intact. No rash noted.  No petechiae, purpura, or bullae.  ____________________________________________    LABS (pertinent positives/negatives) (all labs ordered are listed, but only abnormal results are displayed) Labs Reviewed  BASIC METABOLIC PANEL - Abnormal; Notable for the following:       Result Value   Glucose, Bld 151 (*)    Calcium 8.6 (*)    All other components within normal limits  CBC WITH DIFFERENTIAL/PLATELET - Abnormal; Notable for the following:    RBC 4.24 (*)    Neutro Abs 8.1 (*)    Lymphs Abs 0.7 (*)    All other components within normal limits  TROPONIN I   ____________________________________________   EKG  Interpreted by me  Date: 09/01/2016  Rate: 58  Rhythm: normal sinus rhythm  QRS Axis: normal  Intervals: normal  ST/T Wave abnormalities: normal  Conduction Disutrbances: none  Narrative Interpretation: unremarkable      ____________________________________________    RADIOLOGY  No results found.  ____________________________________________   PROCEDURES Procedures  ____________________________________________   INITIAL IMPRESSION / ASSESSMENT AND PLAN / ED COURSE  Pertinent labs & imaging results that were available during my care of the patient were reviewed by me and considered in my medical decision making (see chart for details).   Clinical Course as of Sep 02 1915  Nancy Fetter Sep 01, 2016  1652 P/w orthostatic dizziness x 2 weeks, worse today. Ivf hydration, check labs. Low susp. ACS  [PS]  8264 OS VS neg but pt did have dizziness with standing.   [PS]    Clinical Course User Index [PS] Carrie Mew, MD     ----------------------------------------- 7:17 PM on 09/01/2016 -----------------------------------------  After fluids patient feels much better. We'll discharge home,  continue oral hydration. Follow up with primary care doctor. Hold Lasix until further advised by his doctor. Low suspicion of ACS PE dissection carditis stroke meningitis encephalitis. No evidence of any acute infection or sepsis. Vital signs are normal.  ____________________________________________   FINAL CLINICAL IMPRESSION(S) / ED DIAGNOSES  Final diagnoses:  Dehydration  Dizziness      New Prescriptions   No medications on file     Portions of this note were generated with dragon dictation software. Dictation errors may occur despite best attempts at proofreading.    Carrie Mew, MD 09/01/16 1919

## 2016-09-01 NOTE — ED Notes (Signed)
Family at bedside. 

## 2016-09-01 NOTE — ED Notes (Signed)
Pt states he felt dizzy upon standing.

## 2016-09-03 ENCOUNTER — Other Ambulatory Visit: Payer: Self-pay | Admitting: Neurosurgery

## 2016-09-03 ENCOUNTER — Telehealth: Payer: Self-pay | Admitting: Cardiovascular Disease

## 2016-09-03 DIAGNOSIS — M7138 Other bursal cyst, other site: Secondary | ICD-10-CM

## 2016-09-03 NOTE — Telephone Encounter (Signed)
Pt c/o BP issue: STAT if pt c/o blurred vision, one-sided weakness or slurred speech  1. What are your last 5 BP readings? See below    2. Are you having any other symptoms (ex. Dizziness, headache, blurred vision, passed out)? Headaches dizziness nauseated some blurred vision one side weakness slurred speech from time to time   3. What is your BP issue?   Friday patient was seen for cyst on back causing leg pain and swelling patient was rxd oxycodone for pain   Sunday patient had an episode of sweating church and had to go home patient wife checked bp on Sunday and digital reading was 69/39 she called ems and they did orthostatic vitals sbp not above 90 .  Patient was treated at [place] ed for low bo and dehydration .    Patient saw pcp today at [facility] and was told to dc fluid pill Patient bp at pcp office was 106/66 and then dropped to 76/40

## 2016-09-03 NOTE — Telephone Encounter (Signed)
Received incoming call from patient and wife. Patient has been having episodes where is BP is dropping associated with dizziness and sweating. It happened on Sunday, EMS was called and he was taken to ED for treatment of dehydration and orthostatic BPs. Recently seen on 5/7 by Dr Vertell Limber for management of back pain assiciated with cyst pressing on a nerve in his back. He was prescribed Norco for pain and started taking it on Friday, 5/11. Was seen at [facility] today and the doctor discontinued patient's enalapril and furosemide, continued Toprol and increase fluids. At present patient reports feeling tired. Denies chest pain, dizziness, SOB, nausea or sweating at this time. BP 127/75, HR 55 while on the phone. Has appt with kidney specialist on 10/15/16.  Discussed with Laurine Blazer who advised for patient to hold enalapril and furosemide, continue Toprol but hold for SBP <110, hold Norco and push fluid intake. Also, add patient on at 1100am tomorrow for [facility] to see.  Appt scheduled for tomorrow. Patient and wife made aware of recommendations and verbalized understanding. They verbalized understanding to call 911 if patient experiences chest pain, SOB, dizziness, fainting, low blood pressure, sweating or episode similar to Sunday.

## 2016-09-04 ENCOUNTER — Encounter: Payer: Self-pay | Admitting: Physician Assistant

## 2016-09-04 ENCOUNTER — Ambulatory Visit (INDEPENDENT_AMBULATORY_CARE_PROVIDER_SITE_OTHER): Payer: Medicare HMO | Admitting: Physician Assistant

## 2016-09-04 VITALS — BP 122/82 | HR 73 | Ht 69.0 in | Wt 193.5 lb

## 2016-09-04 DIAGNOSIS — I2510 Atherosclerotic heart disease of native coronary artery without angina pectoris: Secondary | ICD-10-CM

## 2016-09-04 DIAGNOSIS — M79605 Pain in left leg: Secondary | ICD-10-CM

## 2016-09-04 DIAGNOSIS — I519 Heart disease, unspecified: Secondary | ICD-10-CM | POA: Diagnosis not present

## 2016-09-04 DIAGNOSIS — R0602 Shortness of breath: Secondary | ICD-10-CM

## 2016-09-04 DIAGNOSIS — I951 Orthostatic hypotension: Secondary | ICD-10-CM | POA: Diagnosis not present

## 2016-09-04 MED ORDER — METOPROLOL SUCCINATE ER 25 MG PO TB24: 12.5000 mg | ORAL_TABLET | Freq: Every day | ORAL | 3 refills | Status: DC

## 2016-09-04 NOTE — Patient Instructions (Addendum)
Medication Instructions:  Your physician has recommended you make the following change in your medication:  DECREASE metoprolol to 12.5mg  once daily Continue to HOLD enalapril You may take lasix 40mg  once daily AS NEEDED for weight gain of 3lbs or greater overnight or greater than 5lbs in a week. Systolic blood pressure (top number) must be greater than 110 before taking lasix.    Labwork: none  Testing/Procedures: Your physician has requested that you have an echocardiogram. Echocardiography is a painless test that uses sound waves to create images of your heart. It provides your doctor with information about the size and shape of your heart and how well your heart's chambers and valves are working. This procedure takes approximately one hour. There are no restrictions for this procedure.  Your physician has requested that you have a lexiscan myoview. For further information please visit [email or web address]. Please follow instruction sheet, as given.  [place]  Your caregiver has ordered a Stress Test with nuclear imaging. The purpose of this test is to evaluate the blood supply to your heart muscle. This procedure is referred to as a "Non-Invasive Stress Test." This is because other than having an IV started in your vein, nothing is inserted or "invades" your body. Cardiac stress tests are done to find areas of poor blood flow to the heart by determining the extent of coronary artery disease (CAD). Some patients exercise on a treadmill, which naturally increases the blood flow to your heart, while others who are  unable to walk on a treadmill due to physical limitations have a pharmacologic/chemical stress agent called Lexiscan . This medicine will mimic walking on a treadmill by temporarily increasing your coronary blood flow.   Please note: these test may take anywhere between 2-4 hours to complete  PLEASE REPORT TO [facility] AT THE FIRST DESK WILL  DIRECT YOU WHERE TO GO  Date of Procedure: Tuesday, May 22  Arrival Time for Procedure: 7:45am  Instructions regarding medication:    xx____:  Hold metoprolol night before procedure and morning of procedure   PLEASE NOTIFY THE OFFICE AT LEAST 24 HOURS IN ADVANCE IF YOU ARE UNABLE TO KEEP YOUR APPOINTMENT.  [phone] AND  PLEASE NOTIFY NUCLEAR MEDICINE AT [facility] AT LEAST 24 HOURS IN ADVANCE IF YOU ARE UNABLE TO KEEP YOUR APPOINTMENT. [phone]  How to prepare for your Myoview test:  1. Do not eat or drink after midnight 2. No caffeine for 24 hours prior to test 3. No smoking 24 hours prior to test. 4. Your medication may be taken with water.  If your doctor stopped a medication because of this test, do not take that medication. 5. Ladies, please do not wear dresses.  Skirts or pants are appropriate. Please wear a short sleeve shirt. 6. No perfume, cologne or lotion. 7. Wear comfortable walking shoes. No heels!             Follow-Up: Your physician recommends that you schedule a follow-up appointment in: one month with Dr. Fletcher Anon.    Any Other Special Instructions Will Be Listed Below (If Applicable). Continue to drink fluids (water) Follow up with physician who ordered hydrocodone for alternate pain medications.     If you need a refill on your cardiac medications before your next appointment, please call your pharmacy.  Echocardiogram An echocardiogram, or echocardiography, uses sound waves (ultrasound) to produce an image of your heart. The echocardiogram is simple, painless, obtained within a short period of time, and offers  valuable information to your health care provider. The images from an echocardiogram can provide information such as:  Evidence of coronary artery disease (CAD).  Heart size.  Heart muscle function.  Heart valve function.  Aneurysm detection.  Evidence of a past heart attack.  Fluid buildup around the heart.  Heart muscle  thickening.  Assess heart valve function. Tell a health care provider about:  Any allergies you have.  All medicines you are taking, including vitamins, herbs, eye drops, creams, and over-the-counter medicines.  Any problems you or family members have had with anesthetic medicines.  Any blood disorders you have.  Any surgeries you have had.  Any medical conditions you have.  Whether you are pregnant or may be pregnant. What happens before the procedure? No special preparation is needed. Eat and drink normally. What happens during the procedure?  In order to produce an image of your heart, gel will be applied to your chest and a wand-like tool (transducer) will be moved over your chest. The gel will help transmit the sound waves from the transducer. The sound waves will harmlessly bounce off your heart to allow the heart images to be captured in real-time motion. These images will then be recorded.  You may need an IV to receive a medicine that improves the quality of the pictures. What happens after the procedure? You may return to your normal schedule including diet, activities, and medicines, unless your health care provider tells you otherwise. This information is not intended to replace advice given to you by your health care provider. Make sure you discuss any questions you have with your health care provider. Document Released: 04/05/2000 Document Revised: 11/25/2015 Document Reviewed: 12/14/2012 Elsevier Interactive Patient Education  2017 [employer]. Cardiac Nuclear Scan A cardiac nuclear scan is a test that measures blood flow to the heart when a person is resting and when he or she is exercising. The test looks for problems such as:  Not enough blood reaching a portion of the heart.  The heart muscle not working normally. You may need this test if:  You have heart disease.  You have had abnormal lab results.  You have had heart surgery or angioplasty.  You  have chest pain.  You have shortness of breath. In this test, a radioactive dye (tracer) is injected into your bloodstream. After the tracer has traveled to your heart, an imaging device is used to measure how much of the tracer is absorbed by or distributed to various areas of your heart. This procedure is usually done at a hospital and takes 2-4 hours. Tell a health care provider about:  Any allergies you have.  All medicines you are taking, including vitamins, herbs, eye drops, creams, and over-the-counter medicines.  Any problems you or family members have had with the use of anesthetic medicines.  Any blood disorders you have.  Any surgeries you have had.  Any medical conditions you have.  Whether you are pregnant or may be pregnant. What are the risks? Generally, this is a safe procedure. However, problems may occur, including:  Serious chest pain and heart attack. This is only a risk if the stress portion of the test is done.  Rapid heartbeat.  Sensation of warmth in your chest. This usually passes quickly. What happens before the procedure?  Ask your health care provider about changing or stopping your regular medicines. This is especially important if you are taking diabetes medicines or blood thinners.  Remove your jewelry on the day  of the procedure. What happens during the procedure?  An IV tube will be inserted into one of your veins.  Your health care provider will inject a small amount of radioactive tracer through the tube.  You will wait for 20-40 minutes while the tracer travels through your bloodstream.  Your heart activity will be monitored with an electrocardiogram (ECG).  You will lie down on an exam table.  Images of your heart will be taken for about 15-20 minutes.  You may be asked to exercise on a treadmill or stationary bike. While you exercise, your heart's activity will be monitored with an ECG, and your blood pressure will be checked. If you  are unable to exercise, you may be given a medicine to increase blood flow to parts of your heart.  When blood flow to your heart has peaked, a tracer will again be injected through the IV tube.  After 20-40 minutes, you will get back on the exam table and have more images taken of your heart.  When the procedure is over, your IV tube will be removed. The procedure may vary among health care providers and hospitals. Depending on the type of tracer used, scans may need to be repeated 3-4 hours later. What happens after the procedure?  Unless your health care provider tells you otherwise, you may return to your normal schedule, including diet, activities, and medicines.  Unless your health care provider tells you otherwise, you may increase your fluid intake. This will help flush the contrast dye from your body. Drink enough fluid to keep your urine clear or pale yellow.  It is up to you to get your test results. Ask your health care provider, or the department that is doing the test, when your results will be ready. Summary  A cardiac nuclear scan measures the blood flow to the heart when a person is resting and when he or she is exercising.  You may need this test if you are at risk for heart disease.  Tell your health care provider if you are pregnant.  Unless your health care provider tells you otherwise, increase your fluid intake. This will help flush the contrast dye from your body. Drink enough fluid to keep your urine clear or pale yellow. This information is not intended to replace advice given to you by your health care provider. Make sure you discuss any questions you have with your health care provider. Document Released: 05/03/2004 Document Revised: 04/10/2016 Document Reviewed: 03/17/2013 Elsevier Interactive Patient Education  2017 [employer].

## 2016-09-04 NOTE — Progress Notes (Signed)
Cardiology Office Note Date:  09/04/2016  Patient ID:  Willie, Clark 12-03-1950, MRN [id number] PCP:  [name], MD  Cardiologist:  Dr. Fletcher Anon, MD    Chief Complaint: Orthostatic hypotension, ED follow up  History of Present Illness: Willie Clark is a 66 y.o. male with history of CAD s/p CABG in 1993 with redo in 7902, chronic systolic CHF/ICM, fibromyalgia with chronic pain disorder, gastric ulcer, anxiety/depression, OSA on CPAP, HTN, and HLD who presents for ED follow up of hypotension felt to be related to dehydration.   Most recent LHC in 09/2015 in the setting of abnormal nuclear stress test in 08/2015 (see details below). showed significant 3-vessel CAD with native grafts including LIMA to LAD, SVG to OM2 and SVG to RPDA. No significant change in coronary anatomy since prior cardiac catheterization. Low normal LV systolic function with an EF of 50-55%. Normal LVEDP. Continued aggressive medical therapy was advised.   Prior admission for gastric ulcer in 2015 that improved with treatment. Status post neck surgery in 4097 without complications. Hospitalized in 09/2015 for nephrolithiasis. Underwent successful hernai repair in 01/2016. He was most recently seen by Dr. Fletcher Anon on 05/20/2016 and doing reasonably well at that time, though did note occasional episodes of dizziness associated with tinnitus. No syncope. BP at that time was noted to be 120/80.   He was seen in [facility] ED on 09/01/16 for lightheadedness and nausea x 2 weeks that worsened on 5/13. He was told by outsode MD to increase PO fluid intake and decrease Lasix, though he did not note any improvement in his symptoms. BP checked at home on 5/13 was reported at 65/40. His family called EMS who found the patient to be orthostatic. He was given IV fluids en route to [facility]. Upon his arrival to [facility] he noted dizziness with positional changes. BP was noted to be 97/71 with a heart rate of 57 bpm. He was given IV fluids,  orhtostatics were checked and negative per note. Labs showed SCr 1.03, K+ 3.7, BUN 11, WBC 9.3, HGB 13.9, PLT 152, troponin negative x 1. CXR not performed. EKG showed sinus bradycardia, 58 bpm, nonspecific st/t changes. After IV fluid hydration patient felt better and was discharged home and advised to hold Lasix until outpatient follow up with PCP. He did see PCP on 5/15 who advised continued holding of Lasix as well as holding enalapril. He was continued on metoprolol and advised to follow up with cardiology. When patient called our office on 5/15 he was advised to continue to hold Lasix and enalapril; take Toprol XL 25 mg only if SBP > 147mmHg.  He comes in today doing well overall. He has been dealing with lumbar back pain that radiates down his left leg. He is seeing outside MD for this and has an MRI planned for later in May. Of note, due to his pain he was just started on Norco on 5/11 preceding the above hypotension. Patient also reports over the past couple of weeks, when he was feeling more dizzy as above, he was taking Lasix 40 mg 2-3 times weekly for pedal edema. He reports no Lasix in the past 1 week. Previously, he would need last 1-2 times per month, "if that." Since holding his enalapril, Lasix, and Norco as of 5/15 mid day, along with increasing water intake from two 16 ounce bottles to four 16-ounce bottles over the past 24 hours he has noted much improved blood pressure reeadings at home from  the 269S to 854O systolic. He reports chronic chest pain that is worse with deep inspiration and unchanged for "years." He also notes SOB that has been chronic and unchanged. He was a prior smoker of 2.5 packs daily from "before I could go to school" until 1993. Previously saw pulmonary intermittently, not recently. No orthopnea, current LE swelling, or palpitations. Notes some early satiety that has been present for about 1 year.    Cardiac studies: - LHC 09/25/2015:  Conclusion   The left  ventricular systolic function is normal.   Ost RCA lesion, 100% stenosed.   SVG was injected is normal in caliber.   The graft exhibits minimal luminal irregularities.   Ost 1st Mrg to 1st Mrg lesion, 100% stenosed. The lesion was previously treated with a stent (unknown type).   Prox Cx to Mid Cx lesion, 60% stenosed. The lesion was previously treated with a stent (unknown type).   LIMA was injected is normal in caliber, and is anatomically normal.   Ost LAD lesion, 100% stenosed.   SVG was injected is large.   There is mild diffuse disease in the graft.   Origin lesion, 30% stenosed.   Dist Graft lesion, 20% stenosed.    1. Significant three-vessel coronary artery disease with native grafts including LIMA to LAD, SVG to OM 2 and SVG to right PDA. No significant change in coronary anatomy since most recent cardiac catheterization.    2. Low normal LV systolic function with ejection fraction of 50-55%. Normal left ventricular end-diastolic pressure.    Recommendations:  Continue aggressive medical therapy. The patient can be discharged home from a cardiac standpoint.   - Nuclear stress test 08/21/2015:  Study Result   There was no ST segment deviation noted during stress.   No T wave inversion was noted during stress.   Defect 1: There is a medium defect of moderate severity present in the basal anteroseptal and mid anteroseptal location. This is suggestive of prior infarct with mild peri-infarct ischemia.   Defect 2: There is a small defect present in the mid anterolateral location suggestive of mild ischemia.   This is an intermediate risk study.   The left ventricular ejection fraction is mildly decreased (45-54%   Past Medical History:  Diagnosis Date  . Anxiety   . Arthritis   . Barrett's esophagus   . Bowel obstruction ([place]) 02/2009   small bowel  . CHF (congestive heart failure) ([place])   . Chronic chest pain   . Coronary artery disease 1993   MI   .  Depression   . Difficult intubation   . Diverticulitis   . Fibromyalgia   . Gastritis   . GERD (gastroesophageal reflux disease)   . Headache   . Hyperlipidemia   . Hypertension   . Kidney stones   . Lightheadedness   . Mass on back   . MI (myocardial infarction) ([place])   . OSA (obstructive sleep apnea)   . Pulmonary embolism ([place]) 01/2006   Post-op/treated  . Skin lesions, generalized   . Sleep apnea    C-PAP  . SOB (shortness of breath)   . Stroke ([place])   . Syncope and collapse   . Urticaria     Past Surgical History:  Procedure Laterality Date  . BACK SURGERY    . bowel obstruction  01/2009  . CARDIAC CATHETERIZATION  11/2006   patent grafts. Significant OM3 disease and occluded diagonals.  [name] CARDIAC CATHETERIZATION  06/2010   patent grafts.  significant ISR in proximal LCX but OM2 is bypassed and gives retrograde flow to OM3.   [name] CARDIAC CATHETERIZATION  06/2013   [facility]: Patent grafts with 60% proximal in-stent restenosis in the left circumflex with FFR of 0.85  . CARDIAC CATHETERIZATION Left 09/25/2015   Procedure: Left Heart Cath and Cors/Grafts Angiography;  Surgeon: Wellington Hampshire, MD;  Location: [facility];  Service: Cardiovascular;  Laterality: Left;  . CERVICAL FUSION    . CHOLECYSTECTOMY    . CORONARY ANGIOPLASTY    . CORONARY ARTERY BYPASS GRAFT  1993/01/2006   redo at [facility]. LIMA to LAD, SVG to OM2 and SVG to RPDA  . CYSTOSCOPY WITH STENT PLACEMENT Bilateral 09/24/2015   Procedure: CYSTOSCOPY WITH BILATERAL RETROGRADES, BILATERAL STENT PLACEMENT;  Surgeon: Festus Aloe, MD;  Location: [facility] ORS;  Service: Urology;  Laterality: Bilateral;  . ESOPHAGOGASTRODUODENOSCOPY N/A 10/25/2014   Procedure: ESOPHAGOGASTRODUODENOSCOPY (EGD);  Surgeon: Lollie Sails, MD;  Location: [facility] ENDOSCOPY;  Service: Endoscopy;  Laterality: N/A;  . EYE SURGERY Bilateral    Cataract Extraction with IOL  . INGUINAL HERNIA REPAIR Bilateral 01/23/2016   Procedure: HERNIA  REPAIR INGUINAL ADULT BILATERAL;  Surgeon: Leonie Green, MD;  Location: [facility] ORS;  Service: General;  Laterality: Bilateral;  . NECK SURGERY  06/2009  . SHOULDER SURGERY  2010    Current Outpatient Prescriptions  Medication Sig Dispense Refill  . acetaminophen (TYLENOL) 500 MG tablet Take 500 mg by mouth every 8 (eight) hours as needed for mild pain.     [name] aspirin EC 81 MG tablet Take 81 mg by mouth daily.    . citalopram (CELEXA) 20 MG tablet Take 20 mg by mouth daily.     [name] docusate sodium (COLACE) 100 MG capsule Take 100 mg by mouth 2 (two) times daily.    . fexofenadine (ALLEGRA) 180 MG tablet Take 180 mg by mouth daily.    [name] FIBER PO Take 1 tablet by mouth daily.     . finasteride (PROSCAR) 5 MG tablet Take 5 mg by mouth daily.    [name] gabapentin (NEURONTIN) 800 MG tablet Take 800 mg by mouth 3 (three) times daily.    . Multiple Vitamins-Minerals (HAIR/SKIN/NAILS PO) Take 1 tablet by mouth 3 (three) times daily.     . nitroGLYCERIN (NITROSTAT) 0.4 MG SL tablet Place 0.4 mg under the tongue every 5 (five) minutes as needed for chest pain.     . nortriptyline (PAMELOR) 25 MG capsule Take 25 mg by mouth at bedtime.     [name] oxybutynin (DITROPAN) 5 MG tablet Take 1 tablet (5 mg total) by mouth 3 (three) times daily. 90 tablet 0  . pantoprazole (PROTONIX) 40 MG tablet Take 40 mg by mouth 2 (two) times daily.     . potassium chloride SA (K-DUR,KLOR-CON) 20 MEQ tablet Take 20 mEq by mouth daily.    . rosuvastatin (CRESTOR) 20 MG tablet Take 20 mg by mouth at bedtime.     . Simethicone (GAS-X PO) Take 1 tablet by mouth as needed (gas).     . sucralfate (CARAFATE) 1 G tablet Take 1 g by mouth 4 (four) times daily.     . tamsulosin (FLOMAX) 0.4 MG CAPS capsule Take 0.4 mg by mouth daily.    [name] topiramate (TOPAMAX) 50 MG tablet Take 50 mg by mouth 2 (two) times daily.     . vitamin B-12 (CYANOCOBALAMIN) 1000 MCG tablet Take 1,000 mcg by mouth daily.    . enalapril (VASOTEC)  20 MG tablet TAKE 1/2  TABLET  (10MG ) EVERY DAY (Patient not taking: Reported on 09/04/2016) 45 tablet 3  . furosemide (LASIX) 40 MG tablet Take 1 tablet (40 mg total) by mouth daily as needed. (Patient not taking: Reported on 09/04/2016) 30 tablet 3  . HYDROcodone-acetaminophen (NORCO) 5-325 MG tablet Take 1-2 tablets by mouth every 4 (four) hours as needed for moderate pain. (Patient not taking: Reported on 09/04/2016) 16 tablet 0  . metoprolol succinate (TOPROL-XL) 25 MG 24 hr tablet Take 0.5 tablets (12.5 mg total) by mouth daily. Take with or immediately following a meal. 30 tablet 3   No current facility-administered medications for this visit.     Allergies:   Amitriptyline; Berniece Salines flavor; Cyclobenzaprine; Fish oil; Lidocaine; and Other   Social History:  The patient  reports that he quit smoking about 25 years ago. His smoking use included Cigarettes. He smoked 2.50 packs per day. He has never used smokeless tobacco. He reports that he drinks alcohol. He reports that he does not use drugs.   Family History:  The patient's family history includes Heart attack in his father; Heart disease (age of onset: 72) in his father.  ROS:   Review of Systems  Constitutional: Positive for malaise/fatigue. Negative for chills, diaphoresis, fever and weight loss.  HENT: Negative for congestion.   Eyes: Negative for discharge and redness.  Respiratory: Positive for shortness of breath. Negative for cough, hemoptysis, sputum production and wheezing.   Cardiovascular: Negative for chest pain, palpitations, orthopnea, claudication, leg swelling and PND.  Gastrointestinal: Negative for abdominal pain, blood in stool, heartburn, melena, nausea and vomiting.  Genitourinary: Negative for hematuria.  Musculoskeletal: Positive for back pain, joint pain and myalgias. Negative for falls.       Lumbar back and left leg  Skin: Negative for rash.  Neurological: Positive for dizziness and weakness. Negative for tingling, tremors, sensory  change, speech change, focal weakness and loss of consciousness.  Endo/Heme/Allergies: Does not bruise/bleed easily.  Psychiatric/Behavioral: Negative for substance abuse. The patient is not nervous/anxious.   All other systems reviewed and are negative.    PHYSICAL EXAM:  VS:  BP 122/82 (BP Location: Left Arm, Patient Position: Sitting, Cuff Size: Normal)   Pulse 73   Ht 5\' 9"  (1.753 m)   Wt 193 lb 8 oz (87.8 kg)   BMI 28.57 kg/m  BMI: Body mass index is 28.57 kg/m.  Physical Exam  Constitutional: He is oriented to person, place, and time. He appears well-developed and well-nourished.  HENT:  Head: Normocephalic and atraumatic.  Eyes: Right eye exhibits no discharge. Left eye exhibits no discharge.  Neck: Normal range of motion. No JVD present.  Cardiovascular: Normal rate, regular rhythm, S1 normal, S2 normal and normal heart sounds.  Exam reveals no distant heart sounds, no friction rub, no midsystolic click and no opening snap.   No murmur heard. Pulses:      Dorsalis pedis pulses are 2+ on the right side, and 2+ on the left side.       Posterior tibial pulses are 2+ on the right side, and 2+ on the left side.  Pulmonary/Chest: Effort normal and breath sounds normal. No respiratory distress. He has no decreased breath sounds. He has no wheezes. He has no rales. He exhibits no tenderness.  Abdominal: Soft. He exhibits no distension. There is no tenderness.  Musculoskeletal: He exhibits edema.  Trace pedal edema. He exhibits pain along the lumbar spine and left leg with prolonged sitting.  Repositioning helps.   Neurological: He is alert and oriented to person, place, and time.  Skin: Skin is warm and dry. No cyanosis. Nails show no clubbing.  Psychiatric: He has a normal mood and affect. His speech is normal and behavior is normal. Judgment and thought content normal.    EKG:  Reviewed EKG performed at PCP on 09/03/16, interpreted by me: sinus bradycardia, 59 bpm, nonspecific  lateral st/t changes  Recent Labs: 09/24/2015: TSH 4.033 09/01/2016: BUN 11; Creatinine, Ser 1.03; Hemoglobin 13.9; Platelets 152; Potassium 3.7; Sodium 138  No results found for requested labs within last 8760 hours.   Estimated Creatinine Clearance: 78.4 mL/min (by C-G formula based on SCr of 1.03 mg/dL).   Wt Readings from Last 3 Encounters:  09/04/16 193 lb 8 oz (87.8 kg)  09/01/16 192 lb (87.1 kg)  05/20/16 198 lb 8 oz (90 kg)     Orthostatic Vital Signs: Lying: 128/80, 70 bpm Sitting: 122/80, 67 bpm, dizzy Standing: 112/80, 73 bpm Standing x 3 min: 120/80, 72 bpm  Other studies reviewed: Additional studies/records reviewed today include: summarized above  ASSESSMENT AND PLAN:  1. Orthostatic hypotension: Likely precipitated by dehydration as above along with new narcotic pain medication and recent frequent Lasix usage. Symptoms and BP much improved with adequate hydration and holding of enalapril, Lasix, and Norco. No longer orthostatic. Continue to hold enalapril. Hold Lasix unless he notes a 3 pound weight gain overnight or 5 pound weight gain in a 7 day time span, then take only if SBP > 151mmHg. Decrease Toprol to 12.5 mg daily, hold for SBP < 110 mmHg. BP log at home, bring to appointments. Stay hydrated. Evaluate for ischemia via [place].   2. SOB: Long standing issue for him. Has a significant tobacco abuse history as above. Recommend pulmonary evaluation for PFTs. Check echo to evaluate LV systolic function, valves, and PASP.   3. CAD as above: Has had chronic chest pain for years, unchanged. Recent LHC 09/2015 as above and unchanged from prior. Lexiscan Myoview as above given hypotension. Continue ASA, Toprol, Crestor, and SL NTG prn.   4. Systolic dysfunction: He does not appear volume overloaded at this time. Watch for fluid retention with increased PO fluids. Prn Lasix as above. Toprol as above. Echo as above.  5. Lumbar/left leg pain: Has MRI later this  month. Reports there is a "cyst pushing on the nerves." Hold hydrocodone given drop in BP as above. Follow up with managing MD for pain control. Monitor for increases in BP with pain and treat as needed.   Disposition: F/u with Dr. Fletcher Anon, MD in 1 month.   Current medicines are reviewed at length with the patient today. The patient did not have any concerns regarding medicines.  Melvern Banker PA-C 09/04/2016 12:33 PM     [place] [place] [address] [place], [place] [address] [phone]

## 2016-09-05 ENCOUNTER — Ambulatory Visit
Admission: RE | Admit: 2016-09-05 | Discharge: 2016-09-05 | Disposition: A | Discharge status: Home / Self Care | Payer: Medicare HMO | Source: Ambulatory Visit | Attending: Neurosurgery | Admitting: Neurosurgery

## 2016-09-05 DIAGNOSIS — M7138 Other bursal cyst, other site: Secondary | ICD-10-CM | POA: Insufficient documentation

## 2016-09-05 DIAGNOSIS — M4316 Spondylolisthesis, lumbar region: Secondary | ICD-10-CM | POA: Insufficient documentation

## 2016-09-05 DIAGNOSIS — M48061 Spinal stenosis, lumbar region without neurogenic claudication: Secondary | ICD-10-CM | POA: Diagnosis not present

## 2016-09-05 DIAGNOSIS — M47816 Spondylosis without myelopathy or radiculopathy, lumbar region: Secondary | ICD-10-CM | POA: Diagnosis not present

## 2016-09-05 MED ORDER — GADOBENATE DIMEGLUMINE 529 MG/ML IV SOLN
20.0000 mL | Freq: Once | INTRAVENOUS | Status: AC | PRN
  Administered 2016-09-05: 18 mL via INTRAVENOUS

## 2016-09-09 ENCOUNTER — Telehealth: Payer: Self-pay | Admitting: Physician Assistant

## 2016-09-09 NOTE — Telephone Encounter (Signed)
Reviewed lexi myoview instructions w/pt who verbalized understanding.

## 2016-09-10 ENCOUNTER — Encounter
Admission: RE | Admit: 2016-09-10 | Discharge: 2016-09-10 | Disposition: A | Discharge status: Home / Self Care | Payer: Medicare HMO | Source: Ambulatory Visit | Attending: Physician Assistant | Admitting: Physician Assistant

## 2016-09-10 DIAGNOSIS — R0602 Shortness of breath: Secondary | ICD-10-CM | POA: Insufficient documentation

## 2016-09-10 DIAGNOSIS — I519 Heart disease, unspecified: Secondary | ICD-10-CM | POA: Insufficient documentation

## 2016-09-10 LAB — NM MYOCAR MULTI W/SPECT W/WALL MOTION / EF
CHL CUP RESTING HR STRESS: 57 {beats}/min
LV sys vol: 52 mL
LVDIAVOL: 118 mL (ref 62–150)
Peak HR: 91 {beats}/min
Percent HR: 58 %
TID: 0.87

## 2016-09-10 MED ORDER — TECHNETIUM TC 99M TETROFOSMIN IV KIT
30.6850 | PACK | Freq: Once | INTRAVENOUS | Status: AC | PRN
  Administered 2016-09-10: 30.685 via INTRAVENOUS

## 2016-09-10 MED ORDER — REGADENOSON 0.4 MG/5ML IV SOLN
0.4000 mg | Freq: Once | INTRAVENOUS | Status: AC
  Administered 2016-09-10: 0.4 mg via INTRAVENOUS

## 2016-09-10 MED ORDER — TECHNETIUM TC 99M TETROFOSMIN IV KIT
12.9230 | PACK | Freq: Once | INTRAVENOUS | Status: AC | PRN
  Administered 2016-09-10: 12.923 via INTRAVENOUS

## 2016-09-19 ENCOUNTER — Other Ambulatory Visit: Payer: Self-pay | Admitting: Cardiovascular Disease

## 2016-09-19 DIAGNOSIS — I6523 Occlusion and stenosis of bilateral carotid arteries: Secondary | ICD-10-CM

## 2016-09-20 ENCOUNTER — Other Ambulatory Visit: Payer: Self-pay | Admitting: Neurosurgery

## 2016-09-23 ENCOUNTER — Telehealth: Payer: Self-pay | Admitting: Cardiovascular Disease

## 2016-09-23 NOTE — Telephone Encounter (Signed)
Received cardiac clearance request from [facility] for 6/12 Left L4-5 laminotomy for resection of synovial cyst by Dr. Erline Levine. Placed in Dr. Tyrell Antonio in basket for review.

## 2016-09-25 NOTE — Pre-Procedure Instructions (Signed)
Willie Clark  09/25/2016      [place], [place] [address] Phone: [phone] Fax: [phone]  [id number] [address] [address] [place] [address] Phone: [phone] Fax: [facility] [place], [place] [address] Phone: [phone] Fax: [phone]    Your procedure is scheduled on October 01, 2016.  Report to [facility] Admitting at 820 A.M.  Call this number if you have problems the morning of surgery:  [phone]   Remember:  Do not eat food or drink liquids after midnight.  Take these medicines the morning of surgery with A SIP OF WATER acetaminophen (tylenol), citalopram (celexa), docusate sodium (colace), fexofendadine (allegra), finasteride (proscar), metoprolol (Toprol-XL), gabapentin (neurontin), oxybutyinin (ditropan), pantoprazole (protonix), tamsulosin (flomax), topiramate (topamax).  7 days prior to surgery STOP taking any Aspirin, Aleve, Naproxen, Ibuprofen, Motrin, Advil, Goody's, BC's, all herbal medications, fish oil, and all vitamins   Do not wear jewelry, make-up or nail polish.  Do not wear lotions, powders, or perfumes, or deoderant.  Do not shave 48 hours prior to surgery.  Men may shave face and neck.  Do not bring valuables to the hospital.  [facility] is not responsible for any belongings or valuables.  Contacts, dentures or bridgework may not be worn into surgery.  Leave your suitcase in the car.  After surgery it may be brought to your room.  For patients admitted to the hospital, discharge time will be determined by your treatment team.  Patients discharged the day of surgery will not be allowed to drive home.   Special instructions:   [facility]- Preparing For Surgery  Before surgery, you can play an important role. Because skin is  not sterile, your skin needs to be as free of germs as possible. You can reduce the number of germs on your skin by washing with CHG (chlorahexidine gluconate) Soap before surgery.  CHG is an antiseptic cleaner which kills germs and bonds with the skin to continue killing germs even after washing.  Please do not use if you have an allergy to CHG or antibacterial soaps. If your skin becomes reddened/irritated stop using the CHG.  Do not shave (including legs and underarms) for at least 48 hours prior to first CHG shower. It is OK to shave your face.  Please follow these instructions carefully.   1. Shower the NIGHT BEFORE SURGERY and the MORNING OF SURGERY with CHG.   2. If you chose to wash your hair, wash your hair first as usual with your normal shampoo.  3. After you shampoo, rinse your hair and body thoroughly to remove the shampoo.  4. Use CHG as you would any other liquid soap. You can apply CHG directly to the skin and wash gently with a scrungie or a clean washcloth.   5. Apply the CHG Soap to your body ONLY FROM THE NECK DOWN.  Do not use on open wounds or open sores. Avoid contact with your eyes, ears, mouth and genitals (private parts). Wash genitals (private parts) with your normal soap.  6. Wash thoroughly, paying special attention to the area where your surgery will be performed.  7. Thoroughly rinse your body with warm water from the neck down.  8. DO NOT shower/wash with your normal soap after  using and rinsing off the CHG Soap.  9. Pat yourself dry with a CLEAN TOWEL.   10. Wear CLEAN PAJAMAS   11. Place CLEAN SHEETS on your bed the night of your first shower and DO NOT SLEEP WITH PETS.    Day of Surgery: Do not apply any deodorants/lotions. Please wear clean clothes to the hospital/surgery center.     Please read over the following fact sheets that you were given. Pain Booklet, Coughing and Deep Breathing, MRSA Information and Surgical Site Infection  Prevention

## 2016-09-26 ENCOUNTER — Encounter (HOSPITAL_COMMUNITY): Payer: Self-pay

## 2016-09-26 ENCOUNTER — Encounter (HOSPITAL_COMMUNITY)
Admission: RE | Admit: 2016-09-26 | Discharge: 2016-09-26 | Disposition: A | Discharge status: Home / Self Care | Payer: Medicare HMO | Source: Ambulatory Visit | Attending: Neurosurgery | Admitting: Neurosurgery

## 2016-09-26 DIAGNOSIS — E785 Hyperlipidemia, unspecified: Secondary | ICD-10-CM | POA: Insufficient documentation

## 2016-09-26 DIAGNOSIS — I110 Hypertensive heart disease with heart failure: Secondary | ICD-10-CM | POA: Insufficient documentation

## 2016-09-26 DIAGNOSIS — M7138 Other bursal cyst, other site: Secondary | ICD-10-CM | POA: Diagnosis not present

## 2016-09-26 DIAGNOSIS — Z955 Presence of coronary angioplasty implant and graft: Secondary | ICD-10-CM | POA: Insufficient documentation

## 2016-09-26 DIAGNOSIS — Z87891 Personal history of nicotine dependence: Secondary | ICD-10-CM | POA: Insufficient documentation

## 2016-09-26 DIAGNOSIS — M797 Fibromyalgia: Secondary | ICD-10-CM | POA: Diagnosis not present

## 2016-09-26 DIAGNOSIS — Z79899 Other long term (current) drug therapy: Secondary | ICD-10-CM | POA: Diagnosis not present

## 2016-09-26 DIAGNOSIS — G4733 Obstructive sleep apnea (adult) (pediatric): Secondary | ICD-10-CM | POA: Insufficient documentation

## 2016-09-26 DIAGNOSIS — F329 Major depressive disorder, single episode, unspecified: Secondary | ICD-10-CM | POA: Diagnosis not present

## 2016-09-26 DIAGNOSIS — Z7982 Long term (current) use of aspirin: Secondary | ICD-10-CM | POA: Insufficient documentation

## 2016-09-26 DIAGNOSIS — Z01818 Encounter for other preprocedural examination: Secondary | ICD-10-CM | POA: Diagnosis present

## 2016-09-26 DIAGNOSIS — Z981 Arthrodesis status: Secondary | ICD-10-CM | POA: Insufficient documentation

## 2016-09-26 DIAGNOSIS — K219 Gastro-esophageal reflux disease without esophagitis: Secondary | ICD-10-CM | POA: Insufficient documentation

## 2016-09-26 DIAGNOSIS — Z951 Presence of aortocoronary bypass graft: Secondary | ICD-10-CM | POA: Diagnosis not present

## 2016-09-26 DIAGNOSIS — I5022 Chronic systolic (congestive) heart failure: Secondary | ICD-10-CM | POA: Insufficient documentation

## 2016-09-26 DIAGNOSIS — Z86711 Personal history of pulmonary embolism: Secondary | ICD-10-CM | POA: Insufficient documentation

## 2016-09-26 DIAGNOSIS — I2510 Atherosclerotic heart disease of native coronary artery without angina pectoris: Secondary | ICD-10-CM | POA: Diagnosis not present

## 2016-09-26 DIAGNOSIS — Z01812 Encounter for preprocedural laboratory examination: Secondary | ICD-10-CM | POA: Insufficient documentation

## 2016-09-26 DIAGNOSIS — Z8673 Personal history of transient ischemic attack (TIA), and cerebral infarction without residual deficits: Secondary | ICD-10-CM | POA: Insufficient documentation

## 2016-09-26 HISTORY — DX: Personal history of urinary calculi: Z87.442

## 2016-09-26 LAB — BASIC METABOLIC PANEL
Anion gap: 8 (ref 5–15)
BUN: 10 mg/dL (ref 6–20)
CALCIUM: 9.4 mg/dL (ref 8.9–10.3)
CO2: 27 mmol/L (ref 22–32)
CREATININE: 1.11 mg/dL (ref 0.61–1.24)
Chloride: 103 mmol/L (ref 101–111)
GFR calc Af Amer: >60 mL/min (ref >60)
GLUCOSE: 122 mg/dL — AB (ref 65–99)
POTASSIUM: 4.4 mmol/L (ref 3.5–5.1)
SODIUM: 138 mmol/L (ref 135–145)

## 2016-09-26 LAB — CBC
HEMATOCRIT: 44.2 % (ref 39.0–52.0)
Hemoglobin: 14.7 g/dL (ref 13.0–17.0)
MCH: 32.2 pg (ref 26.0–34.0)
MCHC: 33.3 g/dL (ref 30.0–36.0)
MCV: 96.9 fL (ref 78.0–100.0)
PLATELETS: 141 10*3/uL — AB (ref 150–400)
RBC: 4.56 MIL/uL (ref 4.22–5.81)
RDW: 13.2 % (ref 11.5–15.5)
WBC: 5.6 10*3/uL (ref 4.0–10.5)

## 2016-09-26 LAB — SURGICAL PCR SCREEN
MRSA, PCR: NEGATIVE
STAPHYLOCOCCUS AUREUS: NEGATIVE

## 2016-09-26 NOTE — Progress Notes (Signed)
No Cardiac clearance on chart. Spoke with Maryruth Eve in Dr. Donald Pore office. She said they had not received it yet and was going to call Dr. Jannifer Rodney office to see if they could speed up the process. We should be able to see it in EPIC.

## 2016-09-27 NOTE — Progress Notes (Addendum)
Anesthesia chart review: Patient is a 66 year old male scheduled for left L4-5 laminectomy for resection of synovial cyst on 10/01/2016 by Dr. Vertell Limber.  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   - PCP is listed as Dr. Kingsley Spittle. - Cardiologist is Dr. [name]. Last visit with Christell Faith, PA-C on 09/04/16 for ED follow-up form 09/01/16 visit for hypotension (orthostatic X 2 weeks), dizziness, and dehydration. Hypotension felt likely related to dehydration and new narcotic pain medication. Meds adjusted. Stress test and echo oderd. He also recommended pulmonary evaluation for PFTs. Of these, stress test has been done (see below). Patient also has routine carotid dopplers scheduled (for 2 year f/u 1-39% BICA stenosis). Dr. Melven Sartorius office has requested cardiac clearance, but response is still pending.    Meds include aspirin 81 mg (on hold), Celexa, enalapril (on hold), Allegra, finasteride, furosemide, gabapentin, Norco, Toprol-XL, nitroglycerin, nortriptyline, Ditropan, Protonix, KCl, Crestor, Carafate, Flomax, Topamax, tramadol.  BP (!) 144/86   Pulse 64   Temp 36.5 C   Resp 20   Ht 5' 9.5" (1.765 m)   Wt 195 lb (88.5 kg)   SpO2 100%   BMI 28.38 kg/m   EKG 09/03/16: SB at 59 bpm.   Nuclear Stress Test 09/10/16:  This is an intermediate risk study.  There is a small in size, mild in severity, partially reversible defect involving the apical anterior and apical segments. This may represent a small area of scar and periinfarct ischemia.  Left ventricular  contraction is calculated at 40%, though visual assessment suggestes normal to mildly reduced LV contraction. Echo correlation recommended. (Results reviewed by Christell Faith, PA-C who wrote, "Stress showed a small defect along the apical segments that is not new when compared to prior stress test in 08/2015 and possibly appears improved. Given this test is essentially unchanged from stress test that led to cath in 09/2015 (which also was unchanged from prior cath), and his chronic chest pain is unchanged, would not pursue further ischemic evaluation at this time. If BP would allow down the road, could consider Imdur, and if BP does not allow for addition of Imdur could consider Ranexa. Await echo to estimate EF.")  LHC 09/25/2015: Conclusion   The left ventricular systolic function is normal.   Ost RCA lesion, 100% stenosed.   SVG was injected is normal in caliber.   The graft exhibits minimal luminal irregularities.   Ost 1st Mrg to 1st Mrg lesion, 100% stenosed. The lesion was previously treated with a stent (unknown type).   Prox Cx to Mid Cx lesion, 60% stenosed. The lesion was previously treated with a stent (unknown type).   LIMA was injected is normal in caliber, and is anatomically normal.   Ost LAD lesion, 100% stenosed.   SVG was injected is large.   There is mild diffuse disease in the graft.   Origin lesion, 30% stenosed.   Dist Graft lesion, 20% stenosed.  1. Significant three-vessel coronary artery disease with native grafts including LIMA to LAD, SVG to OM 2 and SVG to right PDA. No significant change in coronary anatomy since most recent cardiac catheterization.  2. Low normal LV systolic function with  ejection fraction of 50-55%. Normal left ventricular end-diastolic pressure.  Recommendations: Continue aggressive medical therapy.   Echo 11/25/11: Study Conclusions - Left ventricle: The cavity size was normal. Wall thickness was normal. Systolic function was mildly  reduced. The estimated ejection fraction was in the range of 45% to 50%. Wall motion was normal; there were no regional wall motion abnormalities. Left ventricular diastolic function parameters were normal. - Mitral valve: Mild regurgitation.  Carotid U/S 08/12/14: Impression: Heterogeneous plaque, bilaterally. 1-39% BICA stenosis. Normal subclavian arteries, bilaterally.  Patent vertebral arteries with antegrade flow.  F/U 2 years.  Preoperative labs noted. Cr 1.11. Glucose 111. H/H 14.7/44.2. PLT 141K.   Awaiting status of cardiac clearance. Chart will be left for follow-up.  George Hugh [facility] Short Stay Center/Anesthesiology Phone [phone] 09/27/2016 11:46 AM  Addendum: Dr. Fletcher Anon reviewed patient's information this afternoon and did sign a note of cardiac clearance for surgery.  George Hugh [facility] Short Stay Center/Anesthesiology Phone [phone] 09/30/2016 3:29 PM

## 2016-09-30 NOTE — Telephone Encounter (Signed)
Following up on pt surgical clearance, pt surgery is tomorrow. Please call and advise status.

## 2016-09-30 NOTE — Telephone Encounter (Signed)
Clearance and last OV notes faxed to [facility], [phone].  Confirmed receipt of information with Janett Billow.

## 2016-09-30 NOTE — Telephone Encounter (Signed)
Notified Janett Billow that I will ask Dr. Fletcher Anon to review and advise this afternoon.

## 2016-10-01 ENCOUNTER — Inpatient Hospital Stay (HOSPITAL_COMMUNITY)
Admission: RE | Admit: 2016-10-01 | Discharge: 2016-10-02 | DRG: 517 | Disposition: A | Discharge status: Home / Self Care | Payer: Medicare HMO | Source: Ambulatory Visit | Attending: Neurosurgery | Admitting: Neurosurgery

## 2016-10-01 ENCOUNTER — Encounter (HOSPITAL_COMMUNITY): Payer: Self-pay | Admitting: *Deleted

## 2016-10-01 ENCOUNTER — Inpatient Hospital Stay (HOSPITAL_COMMUNITY): Payer: Medicare HMO

## 2016-10-01 ENCOUNTER — Encounter (HOSPITAL_COMMUNITY)
Admission: RE | Disposition: A | Discharge status: Home / Self Care | Payer: Self-pay | Source: Ambulatory Visit | Attending: Neurosurgery

## 2016-10-01 ENCOUNTER — Inpatient Hospital Stay (HOSPITAL_COMMUNITY): Payer: Medicare HMO | Admitting: Vascular Surgery

## 2016-10-01 DIAGNOSIS — M797 Fibromyalgia: Secondary | ICD-10-CM | POA: Diagnosis present

## 2016-10-01 DIAGNOSIS — M4726 Other spondylosis with radiculopathy, lumbar region: Secondary | ICD-10-CM | POA: Diagnosis present

## 2016-10-01 DIAGNOSIS — M4316 Spondylolisthesis, lumbar region: Secondary | ICD-10-CM | POA: Diagnosis present

## 2016-10-01 DIAGNOSIS — I2510 Atherosclerotic heart disease of native coronary artery without angina pectoris: Secondary | ICD-10-CM | POA: Diagnosis present

## 2016-10-01 DIAGNOSIS — G4730 Sleep apnea, unspecified: Secondary | ICD-10-CM | POA: Diagnosis present

## 2016-10-01 DIAGNOSIS — I509 Heart failure, unspecified: Secondary | ICD-10-CM | POA: Diagnosis present

## 2016-10-01 DIAGNOSIS — M7138 Other bursal cyst, other site: Principal | ICD-10-CM | POA: Diagnosis present

## 2016-10-01 DIAGNOSIS — M545 Low back pain: Secondary | ICD-10-CM | POA: Diagnosis present

## 2016-10-01 DIAGNOSIS — Z7982 Long term (current) use of aspirin: Secondary | ICD-10-CM | POA: Diagnosis not present

## 2016-10-01 DIAGNOSIS — Z79899 Other long term (current) drug therapy: Secondary | ICD-10-CM | POA: Diagnosis not present

## 2016-10-01 DIAGNOSIS — Z888 Allergy status to other drugs, medicaments and biological substances status: Secondary | ICD-10-CM | POA: Diagnosis not present

## 2016-10-01 DIAGNOSIS — Z91018 Allergy to other foods: Secondary | ICD-10-CM

## 2016-10-01 DIAGNOSIS — K219 Gastro-esophageal reflux disease without esophagitis: Secondary | ICD-10-CM | POA: Diagnosis present

## 2016-10-01 DIAGNOSIS — I252 Old myocardial infarction: Secondary | ICD-10-CM

## 2016-10-01 DIAGNOSIS — I110 Hypertensive heart disease with heart failure: Secondary | ICD-10-CM | POA: Diagnosis present

## 2016-10-01 DIAGNOSIS — Z87891 Personal history of nicotine dependence: Secondary | ICD-10-CM

## 2016-10-01 DIAGNOSIS — I5042 Chronic combined systolic (congestive) and diastolic (congestive) heart failure: Secondary | ICD-10-CM

## 2016-10-01 DIAGNOSIS — Z955 Presence of coronary angioplasty implant and graft: Secondary | ICD-10-CM | POA: Diagnosis not present

## 2016-10-01 DIAGNOSIS — Z419 Encounter for procedure for purposes other than remedying health state, unspecified: Secondary | ICD-10-CM

## 2016-10-01 HISTORY — PX: LUMBAR LAMINECTOMY/DECOMPRESSION MICRODISCECTOMY: SHX5026

## 2016-10-01 SURGERY — LUMBAR LAMINECTOMY/DECOMPRESSION MICRODISCECTOMY 1 LEVEL
Anesthesia: General | Site: Spine Lumbar | Laterality: Left

## 2016-10-01 MED ORDER — MIDAZOLAM HCL 5 MG/5ML IJ SOLN
INTRAMUSCULAR | Status: DC | PRN
  Administered 2016-10-01 (×2): 1 mg via INTRAVENOUS

## 2016-10-01 MED ORDER — NORTRIPTYLINE HCL 25 MG PO CAPS
25.0000 mg | ORAL_CAPSULE | Freq: Every day | ORAL | Status: DC
  Administered 2016-10-01: 25 mg via ORAL
  Filled 2016-10-01: qty 1

## 2016-10-01 MED ORDER — THROMBIN 5000 UNITS EX SOLR
CUTANEOUS | Status: DC | PRN
  Administered 2016-10-01 (×2): 5000 [IU] via TOPICAL

## 2016-10-01 MED ORDER — METHOCARBAMOL 1000 MG/10ML IJ SOLN
500.0000 mg | Freq: Four times a day (QID) | INTRAMUSCULAR | Status: DC | PRN
  Filled 2016-10-01: qty 5

## 2016-10-01 MED ORDER — SODIUM CHLORIDE 0.9% FLUSH
3.0000 mL | Freq: Two times a day (BID) | INTRAVENOUS | Status: DC
  Administered 2016-10-01: 3 mL via INTRAVENOUS

## 2016-10-01 MED ORDER — DOCUSATE SODIUM 100 MG PO CAPS: 100.0000 mg | ORAL_CAPSULE | Freq: Two times a day (BID) | ORAL | Status: DC

## 2016-10-01 MED ORDER — ACETAMINOPHEN 500 MG PO TABS: 500.0000 mg | ORAL_TABLET | Freq: Three times a day (TID) | ORAL | Status: DC | PRN

## 2016-10-01 MED ORDER — ASPIRIN EC 81 MG PO TBEC: 81.0000 mg | DELAYED_RELEASE_TABLET | Freq: Every day | ORAL | Status: DC

## 2016-10-01 MED ORDER — ROSUVASTATIN CALCIUM 20 MG PO TABS
20.0000 mg | ORAL_TABLET | Freq: Every day | ORAL | Status: DC
  Administered 2016-10-01: 20 mg via ORAL
  Filled 2016-10-01: qty 1

## 2016-10-01 MED ORDER — METHYLPREDNISOLONE ACETATE 80 MG/ML IJ SUSP
INTRAMUSCULAR | Status: AC
  Filled 2016-10-01: qty 1

## 2016-10-01 MED ORDER — ACETAMINOPHEN 650 MG RE SUPP: 650.0000 mg | RECTAL | Status: DC | PRN

## 2016-10-01 MED ORDER — CHLORHEXIDINE GLUCONATE CLOTH 2 % EX PADS: 6.0000 | MEDICATED_PAD | Freq: Once | CUTANEOUS | Status: DC

## 2016-10-01 MED ORDER — FENTANYL CITRATE (PF) 100 MCG/2ML IJ SOLN
INTRAMUSCULAR | Status: DC | PRN
Start: 2016-10-01 — End: 2016-10-01
  Administered 2016-10-01: 100 ug via INTRAVENOUS

## 2016-10-01 MED ORDER — FUROSEMIDE 40 MG PO TABS
40.0000 mg | ORAL_TABLET | Freq: Every day | ORAL | Status: DC | PRN
Start: 2016-10-01 — End: 2016-10-02

## 2016-10-01 MED ORDER — ROCURONIUM BROMIDE 100 MG/10ML IV SOLN
INTRAVENOUS | Status: DC | PRN
  Administered 2016-10-01: 50 mg via INTRAVENOUS
  Administered 2016-10-01 (×3): 10 mg via INTRAVENOUS

## 2016-10-01 MED ORDER — DEXAMETHASONE SODIUM PHOSPHATE 10 MG/ML IJ SOLN
INTRAMUSCULAR | Status: AC
  Filled 2016-10-01: qty 1

## 2016-10-01 MED ORDER — SENNOSIDES-DOCUSATE SODIUM 8.6-50 MG PO TABS: 1.0000 | ORAL_TABLET | Freq: Every evening | ORAL | Status: DC | PRN

## 2016-10-01 MED ORDER — PANTOPRAZOLE SODIUM 40 MG IV SOLR: 40.0000 mg | Freq: Every day | INTRAVENOUS | Status: DC

## 2016-10-01 MED ORDER — DEXAMETHASONE SODIUM PHOSPHATE 10 MG/ML IJ SOLN
INTRAMUSCULAR | Status: DC | PRN
  Administered 2016-10-01: 10 mg via INTRAVENOUS

## 2016-10-01 MED ORDER — HEMOSTATIC AGENTS (NO CHARGE) OPTIME
TOPICAL | Status: DC | PRN
  Administered 2016-10-01: 1 via TOPICAL

## 2016-10-01 MED ORDER — FINASTERIDE 5 MG PO TABS: 5.0000 mg | ORAL_TABLET | Freq: Every day | ORAL | Status: DC

## 2016-10-01 MED ORDER — ZOLPIDEM TARTRATE 5 MG PO TABS: 5.0000 mg | ORAL_TABLET | Freq: Every evening | ORAL | Status: DC | PRN

## 2016-10-01 MED ORDER — GLYCOPYRROLATE 0.2 MG/ML IJ SOLN
INTRAMUSCULAR | Status: DC | PRN
  Administered 2016-10-01 (×2): .2 mg via INTRAVENOUS

## 2016-10-01 MED ORDER — ONDANSETRON HCL 4 MG PO TABS
4.0000 mg | ORAL_TABLET | Freq: Four times a day (QID) | ORAL | Status: DC | PRN
  Filled 2016-10-01: qty 1

## 2016-10-01 MED ORDER — NITROGLYCERIN 0.4 MG SL SUBL: 0.4000 mg | SUBLINGUAL_TABLET | SUBLINGUAL | Status: DC | PRN

## 2016-10-01 MED ORDER — ROCURONIUM BROMIDE 10 MG/ML (PF) SYRINGE
PREFILLED_SYRINGE | INTRAVENOUS | Status: AC
  Filled 2016-10-01: qty 5

## 2016-10-01 MED ORDER — ALUM & MAG HYDROXIDE-SIMETH 200-200-20 MG/5ML PO SUSP: 30.0000 mL | Freq: Four times a day (QID) | ORAL | Status: DC | PRN

## 2016-10-01 MED ORDER — THROMBIN 5000 UNITS EX SOLR
CUTANEOUS | Status: AC
  Filled 2016-10-01: qty 15000

## 2016-10-01 MED ORDER — LORATADINE 10 MG PO TABS: 10.0000 mg | ORAL_TABLET | Freq: Every day | ORAL | Status: DC

## 2016-10-01 MED ORDER — HYDROMORPHONE HCL 1 MG/ML IJ SOLN
INTRAMUSCULAR | Status: AC
  Administered 2016-10-01: 0.5 mg
  Filled 2016-10-01: qty 1

## 2016-10-01 MED ORDER — MIDAZOLAM HCL 2 MG/2ML IJ SOLN
INTRAMUSCULAR | Status: AC
  Filled 2016-10-01: qty 2

## 2016-10-01 MED ORDER — CEFAZOLIN SODIUM-DEXTROSE 2-4 GM/100ML-% IV SOLN
2.0000 g | INTRAVENOUS | Status: AC
  Administered 2016-10-01: 2 g via INTRAVENOUS
  Filled 2016-10-01: qty 100

## 2016-10-01 MED ORDER — OXYBUTYNIN CHLORIDE 5 MG PO TABS
5.0000 mg | ORAL_TABLET | Freq: Three times a day (TID) | ORAL | Status: DC
  Administered 2016-10-01 (×2): 5 mg via ORAL
  Filled 2016-10-01 (×4): qty 1

## 2016-10-01 MED ORDER — MENTHOL 3 MG MT LOZG: 1.0000 | LOZENGE | OROMUCOSAL | Status: DC | PRN

## 2016-10-01 MED ORDER — SODIUM CHLORIDE 0.9% FLUSH: 3.0000 mL | INTRAVENOUS | Status: DC | PRN

## 2016-10-01 MED ORDER — FENTANYL CITRATE (PF) 250 MCG/5ML IJ SOLN
INTRAMUSCULAR | Status: AC
Start: 2016-10-01 — End: 2016-10-01
  Filled 2016-10-01: qty 5

## 2016-10-01 MED ORDER — HYDROCODONE-ACETAMINOPHEN 5-325 MG PO TABS
ORAL_TABLET | ORAL | Status: AC
  Administered 2016-10-01: 2 via ORAL
  Filled 2016-10-01: qty 2

## 2016-10-01 MED ORDER — ONDANSETRON HCL 4 MG/2ML IJ SOLN
4.0000 mg | Freq: Four times a day (QID) | INTRAMUSCULAR | Status: DC | PRN
  Administered 2016-10-01: 4 mg via INTRAVENOUS
  Filled 2016-10-01: qty 2

## 2016-10-01 MED ORDER — SUGAMMADEX SODIUM 500 MG/5ML IV SOLN
INTRAVENOUS | Status: DC | PRN
  Administered 2016-10-01: 400 mg via INTRAVENOUS

## 2016-10-01 MED ORDER — HYDROCODONE-ACETAMINOPHEN 5-325 MG PO TABS
1.0000 | ORAL_TABLET | ORAL | Status: DC | PRN
  Administered 2016-10-01 – 2016-10-02 (×3): 2 via ORAL
  Filled 2016-10-01 (×3): qty 2

## 2016-10-01 MED ORDER — SUCRALFATE 1 G PO TABS
1.0000 g | ORAL_TABLET | Freq: Three times a day (TID) | ORAL | Status: DC
  Administered 2016-10-01 – 2016-10-02 (×3): 1 g via ORAL
  Filled 2016-10-01 (×4): qty 1

## 2016-10-01 MED ORDER — ACETAMINOPHEN 325 MG PO TABS: 650.0000 mg | ORAL_TABLET | ORAL | Status: DC | PRN

## 2016-10-01 MED ORDER — GABAPENTIN 400 MG PO CAPS
800.0000 mg | ORAL_CAPSULE | Freq: Three times a day (TID) | ORAL | Status: DC
  Administered 2016-10-01 (×2): 800 mg via ORAL
  Filled 2016-10-01 (×2): qty 2

## 2016-10-01 MED ORDER — TOPIRAMATE 25 MG PO TABS
50.0000 mg | ORAL_TABLET | Freq: Two times a day (BID) | ORAL | Status: DC
  Administered 2016-10-01: 50 mg via ORAL
  Filled 2016-10-01 (×2): qty 2

## 2016-10-01 MED ORDER — ONDANSETRON HCL 4 MG/2ML IJ SOLN
INTRAMUSCULAR | Status: AC
Start: 2016-10-01 — End: 2016-10-01
  Filled 2016-10-01: qty 2

## 2016-10-01 MED ORDER — CEFAZOLIN SODIUM-DEXTROSE 2-4 GM/100ML-% IV SOLN
2.0000 g | Freq: Three times a day (TID) | INTRAVENOUS | Status: AC
  Administered 2016-10-01 – 2016-10-02 (×2): 2 g via INTRAVENOUS
  Filled 2016-10-01 (×2): qty 100

## 2016-10-01 MED ORDER — CITALOPRAM HYDROBROMIDE 20 MG PO TABS
20.0000 mg | ORAL_TABLET | Freq: Every day | ORAL | Status: DC
  Filled 2016-10-01: qty 1

## 2016-10-01 MED ORDER — PANTOPRAZOLE SODIUM 40 MG PO TBEC: 40.0000 mg | DELAYED_RELEASE_TABLET | Freq: Every day | ORAL | Status: DC

## 2016-10-01 MED ORDER — METHOCARBAMOL 500 MG PO TABS
ORAL_TABLET | ORAL | Status: AC
  Administered 2016-10-01: 500 mg via ORAL
  Filled 2016-10-01: qty 1

## 2016-10-01 MED ORDER — FLEET ENEMA 7-19 GM/118ML RE ENEM: 1.0000 | ENEMA | Freq: Once | RECTAL | Status: DC | PRN

## 2016-10-01 MED ORDER — HYDROCODONE-ACETAMINOPHEN 5-325 MG PO TABS
1.0000 | ORAL_TABLET | ORAL | Status: DC | PRN
  Administered 2016-10-01: 2 via ORAL

## 2016-10-01 MED ORDER — ONDANSETRON HCL 4 MG/2ML IJ SOLN
INTRAMUSCULAR | Status: DC | PRN
  Administered 2016-10-01: 4 mg via INTRAVENOUS

## 2016-10-01 MED ORDER — VITAMIN B-12 1000 MCG PO TABS
1000.0000 ug | ORAL_TABLET | Freq: Every day | ORAL | Status: DC
  Filled 2016-10-01: qty 1

## 2016-10-01 MED ORDER — FENTANYL CITRATE (PF) 100 MCG/2ML IJ SOLN
INTRAMUSCULAR | Status: DC | PRN
  Administered 2016-10-01: 25 ug via INTRAVENOUS
  Administered 2016-10-01: 50 ug via INTRAVENOUS
  Administered 2016-10-01: 100 ug via INTRAVENOUS

## 2016-10-01 MED ORDER — POTASSIUM CHLORIDE CRYS ER 20 MEQ PO TBCR: 20.0000 meq | EXTENDED_RELEASE_TABLET | Freq: Every day | ORAL | Status: DC

## 2016-10-01 MED ORDER — PROPOFOL 10 MG/ML IV BOLUS
INTRAVENOUS | Status: DC | PRN
  Administered 2016-10-01: 180 mg via INTRAVENOUS

## 2016-10-01 MED ORDER — FENTANYL CITRATE (PF) 100 MCG/2ML IJ SOLN
INTRAMUSCULAR | Status: AC
  Filled 2016-10-01: qty 2

## 2016-10-01 MED ORDER — NEOSTIGMINE METHYLSULFATE 5 MG/5ML IV SOSY
PREFILLED_SYRINGE | INTRAVENOUS | Status: AC
  Filled 2016-10-01: qty 5

## 2016-10-01 MED ORDER — PHENYLEPHRINE HCL 10 MG/ML IJ SOLN
INTRAVENOUS | Status: DC | PRN
  Administered 2016-10-01: 25 ug/min via INTRAVENOUS

## 2016-10-01 MED ORDER — LACTATED RINGERS IV SOLN
INTRAVENOUS | Status: DC
  Administered 2016-10-01 (×3): via INTRAVENOUS

## 2016-10-01 MED ORDER — LIDOCAINE-EPINEPHRINE 1 %-1:100000 IJ SOLN
INTRAMUSCULAR | Status: AC
  Filled 2016-10-01: qty 1

## 2016-10-01 MED ORDER — BISACODYL 10 MG RE SUPP: 10.0000 mg | Freq: Every day | RECTAL | Status: DC | PRN

## 2016-10-01 MED ORDER — BUPIVACAINE HCL (PF) 0.5 % IJ SOLN
INTRAMUSCULAR | Status: DC | PRN
  Administered 2016-10-01: 5 mL

## 2016-10-01 MED ORDER — METHYLPREDNISOLONE ACETATE 80 MG/ML IJ SUSP
INTRAMUSCULAR | Status: DC | PRN
  Administered 2016-10-01: 80 mg

## 2016-10-01 MED ORDER — PHENOL 1.4 % MT LIQD: 1.0000 | OROMUCOSAL | Status: DC | PRN

## 2016-10-01 MED ORDER — TRAMADOL HCL 50 MG PO TABS: 50.0000 mg | ORAL_TABLET | Freq: Four times a day (QID) | ORAL | Status: DC | PRN

## 2016-10-01 MED ORDER — 0.9 % SODIUM CHLORIDE (POUR BTL) OPTIME
TOPICAL | Status: DC | PRN
  Administered 2016-10-01: 1000 mL

## 2016-10-01 MED ORDER — METOPROLOL SUCCINATE 12.5 MG HALF TABLET
12.5000 mg | ORAL_TABLET | Freq: Every day | ORAL | Status: DC
  Filled 2016-10-01: qty 1

## 2016-10-01 MED ORDER — KCL IN DEXTROSE-NACL 20-5-0.45 MEQ/L-%-% IV SOLN
INTRAVENOUS | Status: DC
  Administered 2016-10-01: 16:00:00 via INTRAVENOUS
  Filled 2016-10-01: qty 1000

## 2016-10-01 MED ORDER — PROPOFOL 10 MG/ML IV BOLUS
INTRAVENOUS | Status: AC
  Filled 2016-10-01: qty 20

## 2016-10-01 MED ORDER — LIDOCAINE-EPINEPHRINE 1 %-1:100000 IJ SOLN
INTRAMUSCULAR | Status: DC | PRN
  Administered 2016-10-01: 5 mL

## 2016-10-01 MED ORDER — MORPHINE SULFATE (PF) 4 MG/ML IV SOLN
2.0000 mg | INTRAVENOUS | Status: DC | PRN
Start: 2016-10-01 — End: 2016-10-02
  Administered 2016-10-01: 2 mg via INTRAVENOUS
  Filled 2016-10-01: qty 1

## 2016-10-01 MED ORDER — METHOCARBAMOL 500 MG PO TABS
500.0000 mg | ORAL_TABLET | Freq: Four times a day (QID) | ORAL | Status: DC | PRN
  Administered 2016-10-01 – 2016-10-02 (×3): 500 mg via ORAL
  Filled 2016-10-01 (×2): qty 1

## 2016-10-01 MED ORDER — TAMSULOSIN HCL 0.4 MG PO CAPS: 0.4000 mg | ORAL_CAPSULE | Freq: Every day | ORAL | Status: DC

## 2016-10-01 MED ORDER — HYDROMORPHONE HCL 1 MG/ML IJ SOLN
0.2500 mg | INTRAMUSCULAR | Status: DC | PRN
  Administered 2016-10-01: 0.5 mg via INTRAVENOUS

## 2016-10-01 MED ORDER — DOCUSATE SODIUM 100 MG PO CAPS
100.0000 mg | ORAL_CAPSULE | Freq: Two times a day (BID) | ORAL | Status: DC
  Administered 2016-10-01: 100 mg via ORAL
  Filled 2016-10-01: qty 1

## 2016-10-01 SURGICAL SUPPLY — 63 items
BIT DRILL NEURO 2X3.1 SFT TUCH (MISCELLANEOUS) ×1 IMPLANT
BLADE CLIPPER SURG (BLADE) IMPLANT
BUR ROUND FLUTED 5 RND (BURR) ×2 IMPLANT
BUR ROUND FLUTED 5MM RND (BURR) ×1
CANISTER SUCT 3000ML PPV (MISCELLANEOUS) ×3 IMPLANT
CARTRIDGE OIL MAESTRO DRILL (MISCELLANEOUS) ×1 IMPLANT
DECANTER SPIKE VIAL GLASS SM (MISCELLANEOUS) ×3 IMPLANT
DERMABOND ADVANCED (GAUZE/BANDAGES/DRESSINGS) ×2
DERMABOND ADVANCED .7 DNX12 (GAUZE/BANDAGES/DRESSINGS) ×1 IMPLANT
DIFFUSER DRILL AIR PNEUMATIC (MISCELLANEOUS) ×3 IMPLANT
DRAPE LAPAROTOMY 100X72X124 (DRAPES) ×3 IMPLANT
DRAPE MICROSCOPE LEICA (MISCELLANEOUS) ×3 IMPLANT
DRAPE POUCH INSTRU U-SHP 10X18 (DRAPES) ×3 IMPLANT
DRAPE SURG 17X23 STRL (DRAPES) ×3 IMPLANT
DRILL NEURO 2X3.1 SOFT TOUCH (MISCELLANEOUS) ×3
DRSG OPSITE POSTOP 3X4 (GAUZE/BANDAGES/DRESSINGS) ×3 IMPLANT
DURAPREP 26ML APPLICATOR (WOUND CARE) ×3 IMPLANT
ELECT REM PT RETURN 9FT ADLT (ELECTROSURGICAL) ×3
ELECTRODE REM PT RTRN 9FT ADLT (ELECTROSURGICAL) ×1 IMPLANT
GAUZE SPONGE 4X4 12PLY STRL (GAUZE/BANDAGES/DRESSINGS) IMPLANT
GAUZE SPONGE 4X4 16PLY XRAY LF (GAUZE/BANDAGES/DRESSINGS) IMPLANT
GLOVE BIO SURGEON STRL SZ 6.5 (GLOVE) ×4 IMPLANT
GLOVE BIO SURGEON STRL SZ8 (GLOVE) ×3 IMPLANT
GLOVE BIO SURGEONS STRL SZ 6.5 (GLOVE) ×2
GLOVE BIOGEL PI IND STRL 6.5 (GLOVE) ×2 IMPLANT
GLOVE BIOGEL PI IND STRL 7.0 (GLOVE) ×1 IMPLANT
GLOVE BIOGEL PI IND STRL 8 (GLOVE) ×1 IMPLANT
GLOVE BIOGEL PI IND STRL 8.5 (GLOVE) ×2 IMPLANT
GLOVE BIOGEL PI INDICATOR 6.5 (GLOVE) ×4
GLOVE BIOGEL PI INDICATOR 7.0 (GLOVE) ×2
GLOVE BIOGEL PI INDICATOR 8 (GLOVE) ×2
GLOVE BIOGEL PI INDICATOR 8.5 (GLOVE) ×4
GLOVE ECLIPSE 8.0 STRL XLNG CF (GLOVE) ×3 IMPLANT
GLOVE ECLIPSE 8.5 STRL (GLOVE) ×3 IMPLANT
GLOVE EXAM NITRILE LRG STRL (GLOVE) IMPLANT
GLOVE EXAM NITRILE XL STR (GLOVE) IMPLANT
GLOVE EXAM NITRILE XS STR PU (GLOVE) IMPLANT
GOWN STRL REUS W/ TWL LRG LVL3 (GOWN DISPOSABLE) ×2 IMPLANT
GOWN STRL REUS W/ TWL XL LVL3 (GOWN DISPOSABLE) ×1 IMPLANT
GOWN STRL REUS W/TWL 2XL LVL3 (GOWN DISPOSABLE) ×6 IMPLANT
GOWN STRL REUS W/TWL LRG LVL3 (GOWN DISPOSABLE) ×4
GOWN STRL REUS W/TWL XL LVL3 (GOWN DISPOSABLE) ×2
KIT BASIN OR (CUSTOM PROCEDURE TRAY) ×3 IMPLANT
KIT ROOM TURNOVER OR (KITS) ×3 IMPLANT
NEEDLE HYPO 18GX1.5 BLUNT FILL (NEEDLE) ×3 IMPLANT
NEEDLE HYPO 25X1 1.5 SAFETY (NEEDLE) ×3 IMPLANT
NEEDLE SPNL 18GX3.5 QUINCKE PK (NEEDLE) ×3 IMPLANT
NS IRRIG 1000ML POUR BTL (IV SOLUTION) ×3 IMPLANT
OIL CARTRIDGE MAESTRO DRILL (MISCELLANEOUS) ×3
PACK LAMINECTOMY NEURO (CUSTOM PROCEDURE TRAY) ×3 IMPLANT
PAD ARMBOARD 7.5X6 YLW CONV (MISCELLANEOUS) ×15 IMPLANT
PATTIES SURGICAL .5 X.5 (GAUZE/BANDAGES/DRESSINGS) ×3 IMPLANT
PATTIES SURGICAL 1X1 (DISPOSABLE) ×3 IMPLANT
RUBBERBAND STERILE (MISCELLANEOUS) ×6 IMPLANT
SPONGE SURGIFOAM ABS GEL SZ50 (HEMOSTASIS) ×3 IMPLANT
SUT VIC AB 0 CT1 18XCR BRD8 (SUTURE) ×1 IMPLANT
SUT VIC AB 0 CT1 8-18 (SUTURE) ×2
SUT VIC AB 2-0 CT1 18 (SUTURE) ×3 IMPLANT
SUT VIC AB 3-0 SH 8-18 (SUTURE) ×3 IMPLANT
SYR 5ML LL (SYRINGE) ×3 IMPLANT
TOWEL GREEN STERILE (TOWEL DISPOSABLE) ×3 IMPLANT
TOWEL GREEN STERILE FF (TOWEL DISPOSABLE) ×3 IMPLANT
WATER STERILE IRR 1000ML POUR (IV SOLUTION) ×3 IMPLANT

## 2016-10-01 NOTE — H&P (Signed)
Patient ID:   [id number] Patient: Willie Clark  Date of Birth: April 22, 1951 Visit Type: Office Visit   Date: 09/20/2016 11:15 AM Provider: Marchia Meiers. Vertell Limber MD   This 66 year old male presents for pain.   History of Present Illness: 1.  pain  Mr. Macho reports miserable left leg and lumbar pain which has increased since his last visit. He reports trouble riding in a car and trouble sleeping. He has had several episodes of low blood pressure recently and believes this could be related to taking oxycodone.  MRI 09/05/2016 Chronic severe facet arthropathy at L4-L5 with acute marrow edema suggesting either acute biomechanical stress or exacerbation of the chronic arthritis. Progressive or recurrent left side synovial cyst measuring up to 15 mm projecting into the spinal canal with severe left lateral recess and moderate to severe spinal stenosis. A separate appearing smaller 4-5 mm cyst projects into the left L4 neural foramen contributing to moderate to severe foraminal stenosis. Stable postoperative changes on the left at L3-4 with mild right L3 foraminal stenosis. Subtle anterolisthesis at L2-3 with severe facet arthropathy, but only mild bilateral L2 foraminal stenosis.           MEDICATIONS(added, continued or stopped this visit): Started Medication Directions Instruction Stopped   Allegra Allergy 180 mg tablet take 1 tablet by oral route  every day     aspirin 81 mg chewable tablet chew 1 tablet by oral route  every day     Carafate 1 gram tablet take 1 tablet by oral route 4 times every day on an empty stomach 1 hour before meals and at bedtime     Celexa 20 mg tablet take 1 tablet by oral route  every day     Colace 100 mg capsule take 1 capsule by oral route  every day at bedtime as needed     Crestor 20 mg tablet take 1 tablet by oral route  every day     cyanocobalamin (vit B-12) 1,000 mcg tablet Take as directed     enalapril maleate 20 mg tablet take 1 tablet by oral route   every day     finasteride 5 mg tablet take 1 tablet by oral route  every day     furosemide 40 mg tablet take 1 tablet by oral route  every day     gabapentin 800 mg tablet take 1 tablet by oral route 3 times every day    03/06/2015 Medrol (Pak) 4 mg tablets in a dose pack take by Oral route as directed  09/20/2016   Nitrostat 0.4 mg sublingual tablet place 1 tablet by sublingual route at 1st sign of attack; may repeat every 5 minutes up to 3 tabs; if norelief seek medical help     nortriptyline 25 mg capsule take 1 capsule by oral route  every day     oxybutynin chloride 5 mg tablet take 1 tablet by oral route 2 times every day    08/30/2016 Percocet 10 mg-325 mg tablet take 1 - 2 tablet by oral route  every 4 - 6 hours as needed  09/20/2016   potassium chloride take 1 capsule by oral route 3 times every day with food     Protonix 40 mg tablet,delayed release take 1 tablet by oral route  every day     tamsulosin 0.4 mg capsule take 1 capsule by oral route  every day 1/2 hour following the same meal each day     Topamax 50 mg  tablet take 1 tablet by oral route 2 times every day     Toprol XL 25 mg tablet,extended release take 1 tablet by oral route  every day    09/20/2016 tramadol 50 mg tablet take 1 tablet by oral route  every 6 hours as needed     triamcinolone acetonide 0.1 % topical ointment apply by topical route 2 times every day a thin layer to the affected area(s)     Tylenol 325 mg tablet take 1 tablet by oral route  every 4 hours as needed       ALLERGIES: Ingredient Reaction Medication Name Comment  LIDOCAINE     AMITRIPTYLINE HCL  Elavil   FISH OIL     CYCLOBENZAPRINE HCL  Flexeril       Vitals Date Temp F BP Pulse Ht In Wt Lb BMI BSA Pain Score  09/20/2016  163/87 73 69 196.8 29.06  8/10      IMPRESSION Lumbar MRI: worsened synovial cyst at L4-5, facet arthritis at L4-5.  Cervical x-rays reveal arthritis. hardware well positioned with solid arthrodesis at  operated levels, some spondylosis and spurring at level below.   physical examination: weak in plantar and dorsi flexion on left at 4/5.  I would recommend a left L4-5 laminectomy and synovial cyst resection due to the patient's increased pain, large cyst and weakness in appropriate dermatome.   prescribed tramadol.   Assessment/Plan # Detail Type Description   1. 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       2. Assessment Radiculopathy, lumbar region (M54.16).           Pain Management Plan Pain Scale: 8/10. Method: Numeric Pain Intensity Scale.  Fall Risk Plan The patient has not fallen in the last year.  prescribed tramadol. nurse education given. scheduled left L4-5 synovial cyst resection pending surgical clearance after communicating with the patient's cardiologist.     MEDICATIONS PRESCRIBED TODAY    Rx Quantity Refills  TRAMADOL HCL 50 mg  60 0            Provider:  Vertell Limber MD, Marchia Meiers 09/20/2016 11:22 AM  Dictation edited by: Dionne Bucy    CC Providers: Kingsley Spittle [place],  [place]  [id number]-   Hemang Shah  [facility] [place], [place] [address]-              Electronically signed by Marchia Meiers. Vertell Limber MD on 09/20/2016 05:57 PM

## 2016-10-01 NOTE — Brief Op Note (Signed)
10/01/2016  1:04 PM  PATIENT:  Willie Clark  66 y.o. 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  PROCEDURE:  Procedure(s) with comments: Left Lumbar four-five Laminotomy for resection of synovial cyst (Left) - left  SURGEON:  Surgeon(s) and Role:    Erline Levine, MD - Primary    Kristeen Miss, MD - Assisting  PHYSICIAN ASSISTANT:   ASSISTANTS: Poteat, RN   ANESTHESIA:   general  EBL:  Total I/O In: 1300 [I.V.:1300] Out: 25 [Blood:25]  BLOOD ADMINISTERED:none  DRAINS: none   LOCAL MEDICATIONS USED:  MARCAINE    and LIDOCAINE   SPECIMEN:  No Specimen  DISPOSITION OF SPECIMEN:  N/A  COUNTS:  YES  TOURNIQUET:  * No tourniquets in log *  DICTATION: DICTATION: Patient has a large synovial cyst at L 45 on the left with severe stenosis and radiculopathy. It was elected to take him to surgery for left L 45 resection of synovial cyst.  Procedure: Patient was brought to the operating room and following the smooth and uncomplicated induction of general endotracheal anesthesia he was placed in a prone position on the Wilson frame. Low back was prepped and draped in the usual sterile fashion with betadine scrub and DuraPrep. Preoperative localizing X ray confirmed correct level. Area of planned incision was infiltrated with local lidocaine. Incision was made in the midline and carried to the lumbodorsal fascia which was incised on the right side of midline. Subperiosteal dissection was performed exposing what was felt to be L 45 level. Intraoperative x-ray demonstrated marker probe at L 45.  A laminotomy of L 4 was performed as well as a foraminotomy overlying L 5.Ligamentum was mobilized and detached, exposing the common dural tube and  L 5 nerve root. The microscope was brought into the field and the L3 nerve root was  mobilized medially and carefully dissected from the synovial cyst which was adherent to the dura.  Using microdissection, the cyst was removed and neural elements thoroughly decompressed. This was an extremely large cyst which extended to the midline and was filled with solid gelatinous material.  The cyst was exenterated and peeled from the dura. There was no violation of the dura.  Hemostasis was assured with bipolar electrocautery and the interspace was irrigated with Depo-Medrol and fentanyl. The lumbodorsal fascia was closed with 0 Vicryl sutures the subcutaneous tissues reapproximated 2-0 Vicryl inverted sutures and the skin edges were reapproximated with 3-0 Vicryl subcuticular stitch. The wound was dressed with Dermabond and an occlusive dressing. Patient was extubated in the operating room and taken to recovery in stable and satisfactory condition having tolerated his operation well counts were correct at the end of the case.   PLAN OF CARE: Admit to inpatient   PATIENT DISPOSITION:  PACU - hemodynamically stable.   Delay start of Pharmacological VTE agent (>24hrs) due to surgical blood loss or risk of bleeding: yes

## 2016-10-01 NOTE — Anesthesia Postprocedure Evaluation (Signed)
Anesthesia Post Note  Patient: ERON STAAT  Procedure(s) Performed: Procedure(s) (LRB): Left Lumbar four-five Laminotomy for resection of synovial cyst (Left)     Patient location during evaluation: PACU Anesthesia Type: General Level of consciousness: awake and alert Pain management: pain level controlled Vital Signs Assessment: post-procedure vital signs reviewed and stable Respiratory status: spontaneous breathing, nonlabored ventilation and respiratory function stable Cardiovascular status: blood pressure returned to baseline and stable Postop Assessment: no signs of nausea or vomiting Anesthetic complications: no    Last Vitals:  Vitals:   10/01/16 1315 10/01/16 1330  BP: 121/87 118/80  Pulse: 65 62  Resp: 11 17  Temp:      Last Pain:  Vitals:   10/01/16 0842  TempSrc:   PainSc: 9                  Abdi Husak,W. EDMOND

## 2016-10-01 NOTE — OR Nursing (Signed)
Handoff report given to Jerene Pitch RN for primary care.

## 2016-10-01 NOTE — Evaluation (Signed)
Physical Therapy Evaluation Patient Details Name: Willie Clark MRN: [id number] DOB: 08/01/1950 Today's Date: 10/01/2016   History of Present Illness  Patient is a 66 y/o male presents s/p L4-5 laminotomy with resection of synovial cyst. PMh includes stroke, PE, MI, HTN, HLD, depression, CAD, CHF, fibromyalgia.  Clinical Impression  Patient presents with pain, decreased sensation BLEs distal to knee bilaterally (premorbidly), residual weakness RLE from prior CVA and impaired mobility s/p above. Tolerated gait training with Min A for balance/safety due to right knee buckling and spasms. Education re: back precautions, log roll technique, positioning etc. Pt will have support from spouse at home. Will follow acutely to maximize independence and mobility prior tor eturn home. Will plan for stair training tomorrow as tolerated. May need RW for support due to weakness and pain control.    Follow Up Recommendations Outpatient PT;Supervision for mobility/OOB;Supervision/Assistance - 24 hour (when cleared by MD)    Equipment Recommendations  Other (comment) (TBA- might need RW )    Recommendations for Other Services       Precautions / Restrictions Precautions Precautions: Back Precaution Booklet Issued: Yes (comment) Precaution Comments: Reviewed handout Restrictions Weight Bearing Restrictions: No      Mobility  Bed Mobility Overal bed mobility: Needs Assistance Bed Mobility: Rolling;Sidelying to Sit Rolling: Supervision Sidelying to sit: Supervision;HOB elevated       General bed mobility comments: Cues for log roll technique, no assist needed. Use of rail.  Transfers Overall transfer level: Needs assistance Equipment used: None Transfers: Sit to/from Stand Sit to Stand: Min guard         General transfer comment: Min guard for safety. Stood from [employer]. transferred to toilet post mobility.  Ambulation/Gait Ambulation/Gait assistance: Min assist Ambulation Distance  (Feet): 200 Feet Assistive device:  (IV pole) Gait Pattern/deviations: Step-through pattern;Decreased stride length;Decreased stance time - left;Decreased step length - right Gait velocity: decreased   General Gait Details: Pt with spasms in back during mobility with right knee buckling multiple times. Cues for upright, Requires UE support- might benefit from RW.   Stairs            Wheelchair Mobility    Modified Rankin (Stroke Patients Only)       Balance Overall balance assessment: Needs assistance Sitting-balance support: Feet supported;No upper extremity supported Sitting balance-Leahy Scale: Good     Standing balance support: During functional activity Standing balance-Leahy Scale: Fair Standing balance comment: Able to stand statically without UE support but require UE support for dynamic standing.                             Pertinent Vitals/Pain Pain Assessment: 0-10 Pain Score: 7  Pain Location: back Pain Descriptors / Indicators: Sore;Operative site guarding Pain Intervention(s): Repositioned;Limited activity within patient's tolerance;Patient requesting pain meds-RN notified;Monitored during session    Home Living Family/patient expects to be discharged to:: Private residence Living Arrangements: Spouse/significant other Available Help at Discharge: Family;Available 24 hours/day Type of Home: House Home Access: Stairs to enter Entrance Stairs-Rails: Right Entrance Stairs-Number of Steps: 5-6 Home Layout: One level Home Equipment: Cane - single point      Prior Function Level of Independence: Independent with assistive device(s)         Comments: Uses SPC for ambulation in the last 2 months. Drives. On disability.     Hand Dominance        Extremity/Trunk Assessment   Upper Extremity Assessment Upper Extremity  Assessment: Defer to OT evaluation    Lower Extremity Assessment Lower Extremity Assessment: LLE  deficits/detail;RLE deficits/detail RLE Deficits / Details: diminished ligh touch sensation distal to knee; residual weakness from CVA RLE Sensation: history of peripheral neuropathy;decreased light touch LLE Deficits / Details: diminished light touch sensation distal to knee- difficult to differentiate btw neuropathy and back. LLE Sensation: history of peripheral neuropathy;decreased light touch    Cervical / Trunk Assessment Cervical / Trunk Assessment: Other exceptions Cervical / Trunk Exceptions: s/p spine surgery  Communication   Communication: No difficulties  Cognition Arousal/Alertness: Awake/alert Behavior During Therapy: WFL for tasks assessed/performed Overall Cognitive Status: Within Functional Limits for tasks assessed                                        General Comments General comments (skin integrity, edema, etc.): Wife present during session.    Exercises     Assessment/Plan    PT Assessment Patient needs continued PT services  PT Problem List Decreased strength;Decreased mobility;Pain;Impaired sensation;Decreased balance;Decreased knowledge of use of DME;Decreased activity tolerance;Decreased knowledge of precautions       PT Treatment Interventions Therapeutic activities;Gait training;Therapeutic exercise;Functional mobility training;Balance training;Stair training;DME instruction;Patient/family education    PT Goals (Current goals can be found in the Care Plan section)  Acute Rehab PT Goals Patient Stated Goal: to make this pain go away PT Goal Formulation: With patient Time For Goal Achievement: 10/15/16 Potential to Achieve Goals: Good    Frequency Min 5X/week   Barriers to discharge Inaccessible home environment stairs    Co-evaluation               AM-PAC PT "6 Clicks" Daily Activity  Outcome Measure Difficulty turning over in bed (including adjusting bedclothes, sheets and blankets)?: None Difficulty moving from  lying on back to sitting on the side of the bed? : None Difficulty sitting down on and standing up from a chair with arms (e.g., wheelchair, bedside commode, etc,.)?: None Help needed moving to and from a bed to chair (including a wheelchair)?: A Little Help needed walking in hospital room?: A Little Help needed climbing 3-5 steps with a railing? : A Lot 6 Click Score: 20    End of Session Equipment Utilized During Treatment: Gait belt Activity Tolerance: Patient limited by pain Patient left: Other (comment);with family/visitor present (in bathroom- RN aware) Nurse Communication: Mobility status PT Visit Diagnosis: Pain;Unsteadiness on feet (R26.81);Muscle weakness (generalized) (M62.81) Pain - Right/Left: Left Pain - part of body:  (back)    Time: 7371-0626 PT Time Calculation (min) (ACUTE ONLY): 23 min   Charges:   PT Evaluation $PT Eval Low Complexity: 1 Procedure PT Treatments $Gait Training: 8-22 mins   PT G Codes:        Wray Kearns, PT, DPT [phone]    Marguarite Arbour A Sheriff Rodenberg 10/01/2016, 4:14 PM

## 2016-10-01 NOTE — Transfer of Care (Signed)
Immediate Anesthesia Transfer of Care Note  Patient: Willie Clark  Procedure(s) Performed: Procedure(s) with comments: Left Lumbar four-five Laminotomy for resection of synovial cyst (Left) - left  Patient Location: PACU  Anesthesia Type:General  Level of Consciousness: awake, alert  and oriented  Airway & Oxygen Therapy: Patient Spontanous Breathing and Patient connected to nasal cannula oxygen  Post-op Assessment: Report given to RN and Post -op Vital signs reviewed and stable  Post vital signs: Reviewed and stable  Last Vitals:  Vitals:   10/01/16 0823 10/01/16 1240  BP: (!) 155/101 128/75  Pulse: 65 78  Resp: 20 14  Temp: 36.4 C 36.2 C    Last Pain:  Vitals:   10/01/16 0842  TempSrc:   PainSc: 9          Complications: No apparent anesthesia complications

## 2016-10-01 NOTE — Anesthesia Preprocedure Evaluation (Addendum)
Anesthesia Evaluation  Patient identified by MRN, date of birth, ID band Patient awake    Reviewed: Allergy & Precautions, H&P , NPO status , Patient's Chart, lab work & pertinent test results  Airway Mallampati: II  TM Distance: >3 FB Neck ROM: Full    Dental no notable dental hx. (+) Teeth Intact, Dental Advisory Given   Pulmonary sleep apnea and Continuous Positive Airway Pressure Ventilation , former smoker,    Pulmonary exam normal breath sounds clear to auscultation       Cardiovascular hypertension, Pt. on medications and Pt. on home beta blockers + CAD, + Past MI, + Cardiac Stents and +CHF   Rhythm:Regular Rate:Normal     Neuro/Psych  Headaches, Anxiety Depression CVA, Residual Symptoms    GI/Hepatic Neg liver ROS, GERD  Medicated and Controlled,  Endo/Other  negative endocrine ROS  Renal/GU Renal disease  negative genitourinary   Musculoskeletal  (+) Arthritis , Osteoarthritis,  Fibromyalgia -  Abdominal   Peds  Hematology negative hematology ROS (+)   Anesthesia Other Findings   Reproductive/Obstetrics negative OB ROS                            Anesthesia Physical Anesthesia Plan  ASA: III  Anesthesia Plan: General   Post-op Pain Management:    Induction: Intravenous  PONV Risk Score and Plan: 3 and Ondansetron, Dexamethasone, Propofol and Midazolam  Airway Management Planned: Oral ETT  Additional Equipment:   Intra-op Plan:   Post-operative Plan: Extubation in OR  Informed Consent: I have reviewed the patients History and Physical, chart, labs and discussed the procedure including the risks, benefits and alternatives for the proposed anesthesia with the patient or authorized representative who has indicated his/her understanding and acceptance.   Dental advisory given  Plan Discussed with: CRNA  Anesthesia Plan Comments:        Anesthesia Quick  Evaluation

## 2016-10-01 NOTE — Op Note (Signed)
10/01/2016  1:04 PM  PATIENT:  Willie Clark  66 y.o. 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  PROCEDURE:  Procedure(s) with comments: Left Lumbar four-five Laminotomy for resection of synovial cyst (Left) - left  SURGEON:  Surgeon(s) and Role:    Erline Levine, MD - Primary    Kristeen Miss, MD - Assisting  PHYSICIAN ASSISTANT:   ASSISTANTS: Poteat, RN   ANESTHESIA:   general  EBL:  Total I/O In: 1300 [I.V.:1300] Out: 25 [Blood:25]  BLOOD ADMINISTERED:none  DRAINS: none   LOCAL MEDICATIONS USED:  MARCAINE    and LIDOCAINE   SPECIMEN:  No Specimen  DISPOSITION OF SPECIMEN:  N/A  COUNTS:  YES  TOURNIQUET:  * No tourniquets in log *  DICTATION: DICTATION: Patient has a large synovial cyst at L 45 on the left with severe stenosis and radiculopathy. It was elected to take him to surgery for left L 45 resection of synovial cyst.  Procedure: Patient was brought to the operating room and following the smooth and uncomplicated induction of general endotracheal anesthesia he was placed in a prone position on the Wilson frame. Low back was prepped and draped in the usual sterile fashion with betadine scrub and DuraPrep. Preoperative localizing X ray confirmed correct level. Area of planned incision was infiltrated with local lidocaine. Incision was made in the midline and carried to the lumbodorsal fascia which was incised on the right side of midline. Subperiosteal dissection was performed exposing what was felt to be L 45 level. Intraoperative x-ray demonstrated marker probe at L 45.  A laminotomy of L 4 was performed as well as a foraminotomy overlying L 5.Ligamentum was mobilized and detached, exposing the common dural tube and  L 5 nerve root. The microscope was brought into the field and the L3 nerve root was  mobilized medially and carefully dissected from the synovial cyst which was adherent to the dura.  Using microdissection, the cyst was removed and neural elements thoroughly decompressed. This was an extremely large cyst which extended to the midline and was filled with solid gelatinous material.  The cyst was exenterated and peeled from the dura. There was no violation of the dura.  Hemostasis was assured with bipolar electrocautery and the interspace was irrigated with Depo-Medrol and fentanyl. The lumbodorsal fascia was closed with 0 Vicryl sutures the subcutaneous tissues reapproximated 2-0 Vicryl inverted sutures and the skin edges were reapproximated with 3-0 Vicryl subcuticular stitch. The wound was dressed with Dermabond and an occlusive dressing. Patient was extubated in the operating room and taken to recovery in stable and satisfactory condition having tolerated his operation well counts were correct at the end of the case.   PLAN OF CARE: Admit to inpatient   PATIENT DISPOSITION:  PACU - hemodynamically stable.   Delay start of Pharmacological VTE agent (>24hrs) due to surgical blood loss or risk of bleeding: yes

## 2016-10-01 NOTE — Anesthesia Procedure Notes (Signed)
Procedure Name: Intubation Date/Time: 10/01/2016 10:51 AM Performed by: Mariea Clonts Pre-anesthesia Checklist: Patient identified, Emergency Drugs available, Suction available, Patient being monitored and Timeout performed Patient Re-evaluated:Patient Re-evaluated prior to inductionOxygen Delivery Method: Circle system utilized Preoxygenation: Pre-oxygenation with 100% oxygen Intubation Type: IV induction and Cricoid Pressure applied Ventilation: Mask ventilation without difficulty Laryngoscope Size: Miller and 2 Grade View: Grade II Tube type: Oral Tube size: 7.0 mm Number of attempts: 2 (first attempte with Mac 4 - unable to obtain view) Airway Equipment and Method: Stylet and Patient positioned with wedge pillow Placement Confirmation: ETT inserted through vocal cords under direct vision,  positive ETCO2,  CO2 detector and breath sounds checked- equal and bilateral Secured at: 24 cm Tube secured with: Tape Dental Injury: Teeth and Oropharynx as per pre-operative assessment

## 2016-10-01 NOTE — Interval H&P Note (Signed)
History and Physical Interval Note:  10/01/2016 10:23 AM  Willie Clark  has presented today for surgery, with the diagnosis of Synovial cyst of lumbar facet joint  The various methods of treatment have been discussed with the patient and family. After consideration of risks, benefits and other options for treatment, the patient has consented to  Procedure(s) with comments: Left L4-5 Laminotomy for resection of synovial cyst (Left) - Left L4-5 Laminotomy for resection of synovial cyst as a surgical intervention .  The patient's history has been reviewed, patient examined, no change in status, stable for surgery.  I have reviewed the patient's chart and labs.  Questions were answered to the patient's satisfaction.     Kobi Aller D

## 2016-10-02 ENCOUNTER — Encounter (HOSPITAL_COMMUNITY): Payer: Self-pay | Admitting: Neurosurgery

## 2016-10-02 MED ORDER — TIZANIDINE HCL 4 MG PO TABS: 4.0000 mg | ORAL_TABLET | Freq: Three times a day (TID) | ORAL | 1 refills | Status: DC

## 2016-10-02 NOTE — Discharge Summary (Signed)
Physician Discharge Summary  Patient ID: Willie Clark MRN: [id number] DOB/AGE: 1950/12/04 66 y.o.  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 Following uncomplicated laminotomy for cyst resection, he recovered nicely and transferred to [facility] for observation. He is mobilizing well.   Consults: None  Significant Diagnostic Studies: radiology: X-Ray: intra-op  Treatments: surgery: Left Lumbar four-five Laminotomy for resection of synovial cyst (Left) - left    Discharge Exam: Blood pressure 106/69, pulse (!) 55, temperature 97.7 F (36.5 C), temperature source Oral, resp. rate 17, height 5' 9.5" (1.765 m), weight 88.5 kg (195 lb), SpO2 100 %. Alert, conversant, sitting up in bed. Good strength BLE. Incision without erythema, swelling , or drainage beneath honeycomb and Dermabond. Pt notes painful "jab" left hip occasionally with ambulation, short in duration, but causes fear of leg buckling. He knows to monitor and use proper body mechanics.     Disposition: 01-Home or Self Care  He verbalizes understanding of d/c instructions. He has f/u appt with DrStern scheduled. He has Tramadol at home for prn use. Tizanidine 2mg  will be eRxed for spasms.      Allergies as of 10/02/2016      Reactions   Amitriptyline    Pt unsure of what happens   Systems developer    Cyclobenzaprine    Pt unsure what hapens   Fish Oil Itching   Lidocaine Nausea And Vomiting   Other Diarrhea   Reactive agents: Onions, bacon    Percocet [oxycodone-acetaminophen] Other (See Comments)   Lowers BP      Medication List    TAKE these medications   acetaminophen 500 MG tablet Commonly known as:  TYLENOL Take 500 mg by mouth every 8 (eight) hours as needed for mild pain.   aspirin EC 81 MG tablet Take 81 mg by mouth daily.   citalopram 20 MG tablet Commonly known as:  CELEXA Take 20 mg by mouth daily.   docusate sodium 100 MG capsule Commonly known as:  COLACE Take 100 mg by mouth 2 (two) times daily.   enalapril 20 MG tablet Commonly known as:  VASOTEC TAKE 1/2 TABLET  (10MG ) EVERY DAY   fexofenadine 180 MG tablet Commonly known as:  ALLEGRA Take 180 mg by mouth daily.   FIBER PO Take 1 tablet by mouth daily.   finasteride 5 MG tablet Commonly known as:  PROSCAR Take 5 mg by mouth daily.   furosemide 40 MG tablet Commonly known as:  LASIX Take 1 tablet (40 mg total) by mouth daily as needed.   gabapentin 800 MG tablet Commonly known as:  NEURONTIN Take 800 mg by mouth 3 (three) times daily.   GAS-X PO Take 1 tablet by mouth as needed (gas).   HAIR/SKIN/NAILS PO Take 1 tablet by mouth 3 (three) times daily.   HYDROcodone-acetaminophen 5-325 MG tablet Commonly known as:  NORCO Take 1-2 tablets by mouth every 4 (four) hours as needed for moderate pain.   metoprolol succinate 25 MG 24 hr tablet Commonly known as:  TOPROL-XL Take 0.5 tablets (  12.5 mg total) by mouth daily. Take with or immediately following a meal.   nitroGLYCERIN 0.4 MG SL tablet Commonly known as:  NITROSTAT Place 0.4 mg under the tongue every 5 (five) minutes as needed for chest pain.   nortriptyline 25 MG capsule Commonly known as:  PAMELOR Take 25 mg by mouth at bedtime.   OVER THE COUNTER MEDICATION Take 1 tablet by mouth 2 (two) times daily. Doterra DigestZen   oxybutynin 5 MG tablet Commonly known as:  DITROPAN Take 1 tablet (5 mg total) by mouth 3 (three) times daily.   pantoprazole 40 MG  tablet Commonly known as:  PROTONIX Take 40 mg by mouth daily.   potassium chloride SA 20 MEQ tablet Commonly known as:  K-DUR,KLOR-CON Take 20 mEq by mouth daily.   rosuvastatin 20 MG tablet Commonly known as:  CRESTOR Take 20 mg by mouth at bedtime.   sucralfate 1 g tablet Commonly known as:  CARAFATE Take 1 g by mouth 4 (four) times daily.   tamsulosin 0.4 MG Caps capsule Commonly known as:  FLOMAX Take 0.4 mg by mouth daily.   tiZANidine 4 MG tablet Commonly known as:  ZANAFLEX Take 1 tablet (4 mg total) by mouth 3 (three) times daily.   topiramate 50 MG tablet Commonly known as:  TOPAMAX Take 50 mg by mouth 2 (two) times daily.   traMADol 50 MG tablet Commonly known as:  ULTRAM Take 50 mg by mouth every 6 (six) hours as needed for moderate pain.   vitamin B-12 1000 MCG tablet Commonly known as:  CYANOCOBALAMIN Take 1,000 mcg by mouth daily.        Signed: Peggyann Shoals, MD 10/02/2016, 8:32 AM

## 2016-10-02 NOTE — Progress Notes (Signed)
Physical Therapy Treatment Patient Details Name: Willie Clark MRN: [id number] DOB: 1950/12/08 Today's Date: 10/02/2016    History of Present Illness Patient is a 66 y/o male presents s/p L4-5 laminotomy with resection of synovial cyst. PMH includes stroke, PE, MI, HTN, HLD, depression, CAD, CHF, fibromyalgia.    PT Comments    Pt progressing towards physical therapy goals. Was able to negotiate stairs with close guard for balance support and safety. Continues to demonstrate occasional L knee buckle and need for AD. Will continue to follow and progress as able per POC.   Follow Up Recommendations  No PT follow up;Supervision/Assistance - 24 hour     Equipment Recommendations  Rolling walker with 5" wheels (pt declined during session - may change his mind)    Recommendations for Other Services       Precautions / Restrictions Precautions Precautions: Back Precaution Booklet Issued: Yes (comment) Precaution Comments: Reviewed handout Restrictions Weight Bearing Restrictions: No    Mobility  Bed Mobility Overal bed mobility: Needs Assistance Bed Mobility: Rolling;Sidelying to Sit Rolling: Supervision Sidelying to sit: Supervision;HOB elevated       General bed mobility comments: Pt received exiting the bathroom.   Transfers Overall transfer level: Needs assistance Equipment used: None Transfers: Sit to/from Stand Sit to Stand: Supervision         General transfer comment: VC's for hand placement on seated surface for safety.   Ambulation/Gait Ambulation/Gait assistance: Min assist Ambulation Distance (Feet): 400 Feet Assistive device: Straight cane Gait Pattern/deviations: Step-through pattern;Decreased stride length;Decreased stance time - left;Decreased step length - right Gait velocity: decreased Gait velocity interpretation: Below normal speed for age/gender General Gait Details: Occasional lateral LOB requiring up to min assist for balance support and  recovery. Pt declines need for walker and wants to continue using his cane at this time.    Stairs Stairs: Yes   Stair Management: Step to pattern;Forwards;One rail Right Number of Stairs: 5 General stair comments: VC's for sequencing and safety.   Wheelchair Mobility    Modified Rankin (Stroke Patients Only)       Balance Overall balance assessment: Needs assistance Sitting-balance support: Feet supported;No upper extremity supported Sitting balance-Leahy Scale: Good     Standing balance support: During functional activity Standing balance-Leahy Scale: Fair Standing balance comment: Able to stand statically without UE support but require UE support for dynamic standing.                            Cognition Arousal/Alertness: Awake/alert Behavior During Therapy: WFL for tasks assessed/performed Overall Cognitive Status: Within Functional Limits for tasks assessed                                        Exercises      General Comments General comments (skin integrity, edema, etc.): Wife present during session.      Pertinent Vitals/Pain Pain Assessment: Faces Faces Pain Scale: Hurts a little bit Pain Location: back Pain Descriptors / Indicators: Sore;Operative site guarding Pain Intervention(s): Monitored during session    Home Living Family/patient expects to be discharged to:: Private residence Living Arrangements: Spouse/significant other Available Help at Discharge: Family;Available 24 hours/day Type of Home: House Home Access: Stairs to enter Entrance Stairs-Rails: Right Home Layout: One level Home Equipment: Cane - single point;Grab bars - tub/shower      Prior Function Level  of Independence: Independent with assistive device(s)      Comments: ADLs and IADLs   PT Goals (current goals can now be found in the care plan section) Acute Rehab PT Goals Patient Stated Goal: Go home today PT Goal Formulation: With patient Time  For Goal Achievement: 10/15/16 Potential to Achieve Goals: Good Progress towards PT goals: Progressing toward goals    Frequency    Min 5X/week      PT Plan Current plan remains appropriate    Co-evaluation              AM-PAC PT "6 Clicks" Daily Activity  Outcome Measure  Difficulty turning over in bed (including adjusting bedclothes, sheets and blankets)?: None Difficulty moving from lying on back to sitting on the side of the bed? : None Difficulty sitting down on and standing up from a chair with arms (e.g., wheelchair, bedside commode, etc,.)?: None Help needed moving to and from a bed to chair (including a wheelchair)?: A Little Help needed walking in hospital room?: A Little Help needed climbing 3-5 steps with a railing? : A Lot 6 Click Score: 20    End of Session Equipment Utilized During Treatment: Gait belt Activity Tolerance: Patient limited by pain Patient left: in chair;with family/visitor present;with call bell/phone within reach Nurse Communication: Mobility status PT Visit Diagnosis: Pain;Unsteadiness on feet (R26.81);Muscle weakness (generalized) (M62.81) Pain - Right/Left: Left Pain - part of body:  (back)     Time: 9450-3888 PT Time Calculation (min) (ACUTE ONLY): 16 min  Charges:  $Gait Training: 8-22 mins                    G Codes:       Willie Clark, PT, DPT Acute Rehabilitation Services Pager: [phone]    Willie Clark 10/02/2016, 10:25 AM

## 2016-10-02 NOTE — Progress Notes (Signed)
Pt and wife given D/C instructions with Rx, verbal understanding was provided. Pt's IV was removed prior to D/C. Pt's incision is clean and dry with no sign of infection. Pt D/C'd home via wheelchair @ 1035 per MD order. Pt is stable @ D/C. Holli Humbles, RN

## 2016-10-02 NOTE — Progress Notes (Addendum)
Subjective: Patient reports "I still have some pain, but he told me it would probably be 3 months"  Objective: Vital signs in last 24 hours: Temp:  [97.2 F (36.2 C)-97.7 F (36.5 C)] 97.7 F (36.5 C) (06/13 0400) Pulse Rate:  [55-81] 55 (06/13 0400) Resp:  [10-20] 17 (06/13 0400) BP: (106-155)/(68-101) 106/69 (06/13 0400) SpO2:  [95 %-100 %] 100 % (06/13 0400) Weight:  [88.5 kg (195 lb)] 88.5 kg (195 lb) (06/12 0823)  Intake/Output from previous day: 06/12 0701 - 06/13 0700 In: 2710 [P.O.:480; I.V.:2130; IV Piggyback:100] Out: 475 [Urine:450; Blood:25] Intake/Output this shift: No intake/output data recorded.  Alert, conversant, sitting up in bed. Good strength BLE. Incision without erythema, swelling , or drainage beneath honeycomb and Dermabond. Pt notes painful "jab" left hip occasionally with ambulation, short in duration, but causes fear of leg buckling. He knows to monitor and use proper body mechanics.   Lab Results: No results for input(s): WBC, HGB, HCT, PLT in the last 72 hours. BMET No results for input(s): NA, K, CL, CO2, GLUCOSE, BUN, CREATININE, CALCIUM in the last 72 hours.  Studies/Results: Dg Lumbar Spine 2-3 Views  Result Date: 10/01/2016 CLINICAL DATA:  Lumbar laminectomy EXAM: LUMBAR SPINE - 2-3 VIEW COMPARISON:  MRI 09/05/2016 FINDINGS: Spinal numbering as on preoperative radiography. 2 intraoperative images show localization and posterior dissection at the L4-5 level. No change in osseous appearance and alignment compared to prior. IMPRESSION: Intraoperative localization at L4-5. Electronically Signed   By: Monte Fantasia M.D.   On: 10/01/2016 12:51    Assessment/Plan: Improving   LOS: 1 day  Per DrStern, discharge to home. He verbalizes understanding of d/c instructions. He has f/u appt with DrStern scheduled. He has Tramadol at home for prn use. Tizanidine 2mg  will be eRxed for spasms.    Verdis Prime 10/02/2016, 7:54 AM   Patient is doing well.   Discharge home.

## 2016-10-02 NOTE — Evaluation (Signed)
Occupational Therapy Evaluation Patient Details Name: Willie Clark MRN: [id number] DOB: [birth year] Today's Date: 10/02/2016    History of Present Illness Patient is a 66 y/o male presents s/p L4-5 laminotomy with resection of synovial cyst. PMh includes stroke, PE, MI, HTN, HLD, depression, CAD, CHF, fibromyalgia.   Clinical Impression   PTA, pt was independent and living with his wife. Pt currently required Min A for ADLs and Min Guard A for functional mobility with RW. Provided education on UB and LB ADLs with AE, precautions, and tub transfers using 3N1. Provided handouts on tub transfer with 3N1 and AE for ADLs. Answered all pt and wife's questions. Recommend dc home once medicially stable. All acute OT needs met. Will sign off. Thank you.     Follow Up Recommendations  DC plan and follow up therapy as arranged by surgeon;No OT follow up;Supervision/Assistance - 24 hour    Equipment Recommendations  3 in 1 bedside commode    Recommendations for Other Services PT consult     Precautions / Restrictions Precautions Precautions: Back Precaution Booklet Issued: Yes (comment) Precaution Comments: Reviewed handout and back precautions. Restrictions Weight Bearing Restrictions: No      Mobility Bed Mobility Overal bed mobility: Needs Assistance Bed Mobility: Rolling;Sidelying to Sit Rolling: Supervision Sidelying to sit: Supervision;HOB elevated       General bed mobility comments: Cues for log roll technique, no assist needed.  Transfers Overall transfer level: Needs assistance Equipment used: None Transfers: Sit to/from Stand Sit to Stand: Supervision         General transfer comment: supervision for safety. Stood from [employer]. transferred to toilet post mobility.    Balance Overall balance assessment: Needs assistance Sitting-balance support: Feet supported;No upper extremity supported Sitting balance-Leahy Scale: Good     Standing balance support: During  functional activity Standing balance-Leahy Scale: Fair Standing balance comment: Able to stand statically without UE support but require UE support for dynamic standing.                           ADL either performed or assessed with clinical judgement   ADL Overall ADL's : Needs assistance/impaired Eating/Feeding: Set up;Sitting   Grooming: Set up;Supervision/safety;Standing   Upper Body Bathing: Set up;Supervision/ safety;Sitting   Lower Body Bathing: Sit to/from stand;Set up;Supervison/ safety   Upper Body Dressing : Set up;Supervision/safety;Sitting   Lower Body Dressing: Supervision/safety;Set up;Sit to/from stand Lower Body Dressing Details (indicate cue type and reason): Pt able to bring ankle up to knee. Educated pt on reacher for donning pants anad underwear Toilet Transfer: Supervision/safety;Ambulation;BSC    Toilet - Clothing Manipulation Details: Educated pt on ways to perform toilet hygiene wihile maintaining precautions   Tub/ Shower Transfer: Min guard;3 in 1;Tub transfer;Ambulation   Functional mobility during ADLs: Supervision/safety General ADL Comments: Pt performing ADLs and functional mobility with supervision. Pt dmeonstrating good functional performance after surgery. Educated pt on precautions during ADLs.      Vision         Perception     Praxis      Pertinent Vitals/Pain Pain Assessment: Faces Faces Pain Scale: Hurts little more Pain Location: back Pain Descriptors / Indicators: Sore;Operative site guarding Pain Intervention(s): Monitored during session;Repositioned     Hand Dominance Right   Extremity/Trunk Assessment Upper Extremity Assessment Upper Extremity Assessment: Overall WFL for tasks assessed   Lower Extremity Assessment Lower Extremity Assessment: Defer to PT evaluation   Cervical / Trunk Assessment  Cervical / Trunk Assessment: Other exceptions Cervical / Trunk Exceptions: s/p spine surgery   Communication  Communication Communication: No difficulties   Cognition Arousal/Alertness: Awake/alert Behavior During Therapy: WFL for tasks assessed/performed Overall Cognitive Status: Within Functional Limits for tasks assessed                                     General Comments  Wife present for session    Exercises     Shoulder Instructions      Home Living Family/patient expects to be discharged to:: Private residence Living Arrangements: Spouse/significant other Available Help at Discharge: Family;Available 24 hours/day Type of Home: House Home Access: Stairs to enter [employer] of Steps: 5-6 Entrance Stairs-Rails: Right Home Layout: One level     Bathroom Shower/Tub: Teacher, early years/pre: Standard     Home Equipment: Cane - single point;Grab bars - tub/shower          Prior Functioning/Environment Level of Independence: Independent with assistive device(s)        Comments: ADLs and IADLs        OT Problem List: Decreased strength;Decreased activity tolerance;Impaired balance (sitting and/or standing);Decreased range of motion;Decreased safety awareness;Decreased knowledge of use of DME or AE;Decreased knowledge of precautions;Pain      OT Treatment/Interventions:      OT Goals(Current goals can be found in the care plan section) Acute Rehab OT Goals Patient Stated Goal: Go home OT Goal Formulation: With patient Time For Goal Achievement: 10/16/16 Potential to Achieve Goals: Good  OT Frequency:     Barriers to D/C:            Co-evaluation              AM-PAC PT "6 Clicks" Daily Activity     Outcome Measure Help from another person eating meals?: None Help from another person taking care of personal grooming?: None Help from another person toileting, which includes using toliet, bedpan, or urinal?: A Little Help from another person bathing (including washing, rinsing, drying)?: A Little Help from another  person to put on and taking off regular upper body clothing?: None Help from another person to put on and taking off regular lower body clothing?: A Little 6 Click Score: 21   End of Session Nurse Communication: Mobility status;Precautions  Activity Tolerance: Patient tolerated treatment well Patient left: in bed;with call bell/phone within reach;with family/visitor present  OT Visit Diagnosis: Unsteadiness on feet (R26.81);Other abnormalities of gait and mobility (R26.89);Pain Pain - Right/Left:  (back) Pain - part of body:  (back)                Time: 3154-0086 OT Time Calculation (min): 24 min Charges:  OT Evaluation $OT Eval Low Complexity: 1 Procedure OT Treatments $Self Care/Home Management : 8-22 mins G-Codes:     Press [place] MSOT, OTR/L Acute Rehab Pager: [phone] Office: [place] 10/02/2016, 9:01 AM

## 2016-10-02 NOTE — Discharge Instructions (Signed)

## 2016-10-11 ENCOUNTER — Other Ambulatory Visit: Payer: Medicare HMO

## 2016-10-14 ENCOUNTER — Other Ambulatory Visit: Payer: Self-pay

## 2016-10-14 ENCOUNTER — Ambulatory Visit: Payer: Medicare HMO

## 2016-10-14 ENCOUNTER — Ambulatory Visit (INDEPENDENT_AMBULATORY_CARE_PROVIDER_SITE_OTHER): Payer: Medicare HMO

## 2016-10-14 DIAGNOSIS — I6523 Occlusion and stenosis of bilateral carotid arteries: Secondary | ICD-10-CM | POA: Diagnosis not present

## 2016-10-14 DIAGNOSIS — R0602 Shortness of breath: Secondary | ICD-10-CM

## 2016-10-14 LAB — VAS US CAROTID
LCCADDIAS: -26 cm/s
LCCAPDIAS: 18 cm/s
LCCAPSYS: 119 cm/s
LEFT ECA DIAS: -15 cm/s
LEFT VERTEBRAL DIAS: -13 cm/s
LICADDIAS: -44 cm/s
LICAPDIAS: -14 cm/s
LICAPSYS: -76 cm/s
Left CCA dist sys: -94 cm/s
Left ICA dist sys: -108 cm/s
RIGHT ECA DIAS: -16 cm/s
RIGHT VERTEBRAL DIAS: 20 cm/s
Right CCA prox dias: 16 cm/s
Right CCA prox sys: 109 cm/s
Right cca dist sys: -48 cm/s

## 2016-10-18 ENCOUNTER — Ambulatory Visit (INDEPENDENT_AMBULATORY_CARE_PROVIDER_SITE_OTHER): Payer: Medicare HMO | Admitting: Cardiovascular Disease

## 2016-10-18 ENCOUNTER — Encounter: Payer: Self-pay | Admitting: Cardiovascular Disease

## 2016-10-18 VITALS — BP 112/74 | HR 58 | Ht 69.5 in | Wt 196.8 lb

## 2016-10-18 DIAGNOSIS — E785 Hyperlipidemia, unspecified: Secondary | ICD-10-CM

## 2016-10-18 DIAGNOSIS — I779 Disorder of arteries and arterioles, unspecified: Secondary | ICD-10-CM | POA: Diagnosis not present

## 2016-10-18 DIAGNOSIS — I739 Peripheral vascular disease, unspecified: Secondary | ICD-10-CM

## 2016-10-18 DIAGNOSIS — I25118 Atherosclerotic heart disease of native coronary artery with other forms of angina pectoris: Secondary | ICD-10-CM

## 2016-10-18 DIAGNOSIS — I10 Essential (primary) hypertension: Secondary | ICD-10-CM

## 2016-10-18 NOTE — Progress Notes (Signed)
Cardiology Office Note   Date:  10/18/2016   ID:  Willie Clark, DOB [birth year], MRN [id number]  PCP:  [name], MD  Cardiologist:   [name], MD   Chief Complaint  Patient presents with  . OTHER    F/u echo and myoview. Pt would like to discuss Furosemide and Topamax may be causing kidney stones.Meds reviewed verbally with pt.      History of Present Illness: Willie Clark is a 66 y.o. male who presents for  a followup visit. He has known history of coronary artery disease. He is status post coronary artery bypass graft surgery in 1993 and redo in 2007.   He suffers from fibromyalgia and chronic pain.  He had GI symptoms in 2015 due to documented gastric ulcer. This improved with treatment. He had neck surgery in 6759 without complications.   Cardiac catheterization in June, 2017 showed significant underlying three-vessel disease with patent grafts and no change in coronary anatomy since most recent cardiac catheterization. Ejection fraction was 50-55% with normal left ventricular end-diastolic pressure.  He was seen recently by Thurmond Butts for symptomatic hypotension which improved after holding furosemide and enalapril. He underwent a pharmacologic nuclear stress test which was moderate risk overall and showed partially reversible distal anterior and apical defect similar to prior stress test. The patient underwent back surgery recently with no complications. He is doing reasonably well at the present time with no worsening dyspnea. He has chronic chest pain. Blood pressure has improved.   Past Medical History:  Diagnosis Date  . Anxiety   . Arthritis   . Barrett's esophagus   . Bowel obstruction ([place]) 02/2009   small bowel  . CHF (congestive heart failure) ([place])   . Chronic chest pain   . Coronary artery disease 1993   MI   . Depression   . Diverticulitis   . Dyspnea    with exertion   . Fibromyalgia   . Gastritis   . GERD (gastroesophageal reflux disease)    . Headache   . History of kidney stones   . Hyperlipidemia   . Hypertension   . Kidney stones    going to [facility]  June 26 to see kidney specialist  . Lightheadedness   . Mass on back   . MI (myocardial infarction) ([place])   . OSA (obstructive sleep apnea)   . Pulmonary embolism ([place]) 01/2006   Post-op/treated  . Skin lesions, generalized   . Sleep apnea    C-PAP  . SOB (shortness of breath)   . Stroke [facility]) 1993   right side weakness no blood thinners   on Aspirin  . Syncope and collapse   . Urticaria     Past Surgical History:  Procedure Laterality Date  . BACK SURGERY    . bowel obstruction  01/2009  . CARDIAC CATHETERIZATION  11/2006   patent grafts. Significant OM3 disease and occluded diagonals.  [name] CARDIAC CATHETERIZATION  06/2010   patent grafts. significant ISR in proximal LCX but OM2 is bypassed and gives retrograde flow to OM3.   [name] CARDIAC CATHETERIZATION  06/2013   [facility]: Patent grafts with 60% proximal in-stent restenosis in the left circumflex with FFR of 0.85  . CARDIAC CATHETERIZATION Left 09/25/2015   Procedure: Left Heart Cath and Cors/Grafts Angiography;  Surgeon: Wellington Hampshire, MD;  Location: [place] [facility];  Service: Cardiovascular;  Laterality: Left;  . CERVICAL FUSION    . CHOLECYSTECTOMY    .  CORONARY ANGIOPLASTY  2006  . CORONARY ARTERY BYPASS GRAFT  1993/01/2006   redo at [facility]. LIMA to LAD, SVG to OM2 and SVG to RPDA  . CYSTOSCOPY WITH STENT PLACEMENT Bilateral 09/24/2015   Procedure: CYSTOSCOPY WITH BILATERAL RETROGRADES, BILATERAL STENT PLACEMENT;  Surgeon: Festus Aloe, MD;  Location: [facility] ORS;  Service: Urology;  Laterality: Bilateral;  . ESOPHAGOGASTRODUODENOSCOPY N/A 10/25/2014   Procedure: ESOPHAGOGASTRODUODENOSCOPY (EGD);  Surgeon: Lollie Sails, MD;  Location: [facility] END[facility]COPY;  Service: Endoscopy;  Laterality: N/A;  . EYE SURGERY Bilateral    Cataract Extraction with IOL  . HERNIA REPAIR    . INGUINAL HERNIA REPAIR Bilateral  01/23/2016   Procedure: HERNIA REPAIR INGUINAL ADULT BILATERAL;  Surgeon: Leonie Green, MD;  Location: [facility] ORS;  Service: General;  Laterality: Bilateral;  . LUMBAR LAMINECTOMY/DECOMPRESSION MICRODISCECTOMY Left 10/01/2016   Procedure: Left Lumbar four-five Laminotomy for resection of synovial cyst;  Surgeon: Erline Levine, MD;  Location: [place];  Service: Neurosurgery;  Laterality: Left;  left  . NECK SURGERY  06/2009  . SHOULDER SURGERY  2010     Current Outpatient Prescriptions  Medication Sig Dispense Refill  . acetaminophen (TYLENOL) 500 MG tablet Take 500 mg by mouth every 8 (eight) hours as needed for mild pain.     [name] aspirin EC 81 MG tablet Take 81 mg by mouth daily.    . citalopram (CELEXA) 20 MG tablet Take 20 mg by mouth daily.     [name] docusate sodium (COLACE) 100 MG capsule Take 100 mg by mouth 2 (two) times daily.    . enalapril (VASOTEC) 20 MG tablet TAKE 1/2 TABLET  (10MG ) EVERY DAY 45 tablet 3  . fexofenadine (ALLEGRA) 180 MG tablet Take 180 mg by mouth daily.    [name] FIBER PO Take 1 tablet by mouth daily.     . finasteride (PROSCAR) 5 MG tablet Take 5 mg by mouth daily.    . furosemide (LASIX) 40 MG tablet Take 1 tablet (40 mg total) by mouth daily as needed. 30 tablet 3  . gabapentin (NEURONTIN) 800 MG tablet Take 800 mg by mouth 3 (three) times daily.    [name] HYDROcodone-acetaminophen (NORCO) 5-325 MG tablet Take 1-2 tablets by mouth every 4 (four) hours as needed for moderate pain. 16 tablet 0  . metoprolol succinate (TOPROL-XL) 25 MG 24 hr tablet Take 0.5 tablets (12.5 mg total) by mouth daily. Take with or immediately following a meal. 30 tablet 3  . Multiple Vitamins-Minerals (HAIR/SKIN/NAILS PO) Take 1 tablet by mouth 3 (three) times daily.     . nitroGLYCERIN (NITROSTAT) 0.4 MG SL tablet Place 0.4 mg under the tongue every 5 (five) minutes as needed for chest pain.     . nortriptyline (PAMELOR) 25 MG capsule Take 25 mg by mouth at bedtime.     [name] oxybutynin (DITROPAN) 5  MG tablet Take 1 tablet (5 mg total) by mouth 3 (three) times daily. 90 tablet 0  . pantoprazole (PROTONIX) 40 MG tablet Take 40 mg by mouth daily.     . potassium chloride SA (K-DUR,KLOR-CON) 20 MEQ tablet Take 20 mEq by mouth daily.    . rosuvastatin (CRESTOR) 20 MG tablet Take 20 mg by mouth at bedtime.     . Simethicone (GAS-X PO) Take 1 tablet by mouth as needed (gas).     . sucralfate (CARAFATE) 1 G tablet Take 1 g by mouth 4 (four) times daily.     . tamsulosin (FLOMAX) 0.4 MG CAPS capsule Take  0.4 mg by mouth daily.    [name] tiZANidine (ZANAFLEX) 4 MG tablet Take 1 tablet (4 mg total) by mouth 3 (three) times daily. 90 tablet 1  . topiramate (TOPAMAX) 50 MG tablet Take 50 mg by mouth 2 (two) times daily.     . traMADol (ULTRAM) 50 MG tablet Take 50 mg by mouth every 6 (six) hours as needed for moderate pain.    . vitamin B-12 (CYANOCOBALAMIN) 1000 MCG tablet Take 1,000 mcg by mouth daily.     No current facility-administered medications for this visit.     Allergies:   Amitriptyline; Berniece Salines flavor; Cyclobenzaprine; Fish oil; Lidocaine; Other; and Percocet [oxycodone-acetaminophen]    Social History:  The patient  reports that he quit smoking about 25 years ago. His smoking use included Cigarettes. He smoked 2.50 packs per day. He has never used smokeless tobacco. He reports that he drinks alcohol. He reports that he does not use drugs.   Family History:  The patient's family history includes Heart attack in his father; Heart disease (age of onset: 70) in his father.    ROS:  Please see the history of present illness.   Otherwise, review of systems are positive for none.   All other systems are reviewed and negative.    PHYSICAL EXAM: VS:  BP 112/74 (BP Location: Right Arm, Patient Position: Sitting, Cuff Size: Normal)   Pulse (!) 58   Ht 5' 9.5" (1.765 m)   Wt 196 lb 12 oz (89.2 kg)   BMI 28.64 kg/m  , BMI Body mass index is 28.64 kg/m. GEN: Well nourished, well developed, in no  acute distress  HEENT: normal  Neck: no JVD, carotid bruits, or masses Cardiac: RRR; no murmurs, rubs, or gallops,no edema  Respiratory:  clear to auscultation bilaterally, normal work of breathing GI: soft, nontender, nondistended, + BS MS: no deformity or atrophy  Skin: warm and dry, no rash Neuro:  Strength and sensation are intact Psych: euthymic mood, full affect   EKG:  EKG is  Not ordered today.    Recent Labs: 09/26/2016: BUN 10; Creatinine, Ser 1.11; Hemoglobin 14.7; Platelets 141; Potassium 4.4; Sodium 138    Lipid Panel No results found for: CHOL, TRIG, HDL, CHOLHDL, VLDL, LDLCALC, LDLDIRECT    Wt Readings from Last 3 Encounters:  10/18/16 196 lb 12 oz (89.2 kg)  10/01/16 195 lb (88.5 kg)  09/26/16 195 lb (88.5 kg)         ASSESSMENT AND PLAN:  1.  Coronary artery disease involving native coronary arteries with Chronic angina:  Cardiac catheterizationlast year  showed patent grafts and no change from before.   Recent stress test was not different from before. Continue medical therapy.   2. Essential hypertension: Blood pressure is well controlled on current medications.Hypotension resolved.  3. Chronic venous insufficiency: BNP was normal. He takes furosemide as needed. I advised him to minimize use of furosemide given that there is a concern about increased calcium secretion in the urine leading to nephrolithiasis. Echocardiogram this week showed normal LV systolic function.  4. Hyperlipidemia: Continue current dose of rosuvastatin with a target LDL of less than 70.  5. Mild carotid disease: Recent repeat carotid Doppler showed less than 40% stenosis bilaterally. No need to repeat.  Disposition:   FU with me in 6 months  Signed,  [name], MD  10/18/2016 3:00 PM    [place]

## 2016-10-18 NOTE — Patient Instructions (Signed)
Medication Instructions: Continue same medications.   Labwork: None.   Procedures/Testing: None.   Follow-Up: 6 months with Dr. Arida.   Any Additional Special Instructions Will Be Listed Below (If Applicable).     If you need a refill on your cardiac medications before your next appointment, please call your pharmacy.   

## 2016-11-05 ENCOUNTER — Telehealth: Payer: Self-pay | Admitting: Cardiovascular Disease

## 2016-11-05 DIAGNOSIS — I5042 Chronic combined systolic (congestive) and diastolic (congestive) heart failure: Secondary | ICD-10-CM

## 2016-11-05 DIAGNOSIS — I10 Essential (primary) hypertension: Secondary | ICD-10-CM

## 2016-11-05 NOTE — Telephone Encounter (Signed)
Spoke with patients wife and reviewed physician recommendations. She reports that he has already been off the enalapril from the last office visit and made Dr. Fletcher Anon aware. Reviewed other recommendations with her and instructed her to have him go to [place] to [facility] tomorrow first thing to have labs done. She verbalized understanding of our conversation, agreement with plan, and had no further questions at this time.

## 2016-11-05 NOTE — Telephone Encounter (Signed)
Pt wife calling stating she just received a call from patient   He told her his BP was dropping   115 pm : 77/55 HR 67 147 pm: 115/68 HR 72 2 PM:  Sitting 118/71 standing 73/49  Would like to know what to do since it is running lower than normal  Please advise.

## 2016-11-05 NOTE — Telephone Encounter (Signed)
Check CBC and basic metabolic profile to make sure he is not anemic or dehydrated. Increase fluid intake. Hold enalapril until blood pressure improves.

## 2016-11-05 NOTE — Telephone Encounter (Signed)
Patients wife was working on the coffee pot and just starting feel real weak and blood pressure was 77/55 with heart rate of 67. She reports the other blood pressures as listed;  1:47 pm 115/68 HR 72 2:00 pm laying 118/71 HR 67 Sitting 99/68 HR 73 Sitting 73/49 HR 66  2:45 pm Standing 116/67 HR 68 Sitting 84/57 HR 86 107/71 HR 74  3:45 pm (on toilet) 125/82 HR 78 Then standing 110/76 HR 83 Sitting 119/69 Laying 127/75 HR 70  Reviewed with her to have him change positions slowly and to continue monitoring blood pressures especially when he is not feeling well. Let her know that I would forward to Dr. Fletcher Anon for his review. She verbalized understanding, agreement with plan, and had no further questions at this time.

## 2016-11-06 ENCOUNTER — Other Ambulatory Visit
Admission: RE | Admit: 2016-11-06 | Discharge: 2016-11-06 | Disposition: A | Discharge status: Home / Self Care | Payer: Medicare HMO | Source: Ambulatory Visit | Attending: Cardiovascular Disease | Admitting: Cardiovascular Disease

## 2016-11-06 DIAGNOSIS — I5042 Chronic combined systolic (congestive) and diastolic (congestive) heart failure: Secondary | ICD-10-CM | POA: Diagnosis present

## 2016-11-06 DIAGNOSIS — I10 Essential (primary) hypertension: Secondary | ICD-10-CM | POA: Diagnosis present

## 2016-11-06 LAB — BASIC METABOLIC PANEL
Anion gap: 8 (ref 5–15)
BUN: 11 mg/dL (ref 6–20)
CHLORIDE: 103 mmol/L (ref 101–111)
CO2: 28 mmol/L (ref 22–32)
CREATININE: 0.96 mg/dL (ref 0.61–1.24)
Calcium: 9.3 mg/dL (ref 8.9–10.3)
GFR calc Af Amer: >60 mL/min (ref >60)
GFR calc non Af Amer: >60 mL/min (ref >60)
GLUCOSE: 109 mg/dL — AB (ref 65–99)
POTASSIUM: 3.9 mmol/L (ref 3.5–5.1)
Sodium: 139 mmol/L (ref 135–145)

## 2016-11-06 LAB — CBC WITH DIFFERENTIAL/PLATELET
Basophils Absolute: 0 10*3/uL (ref 0–0.1)
Basophils Relative: 0 %
EOS ABS: 0.1 10*3/uL (ref 0–0.7)
EOS PCT: 1 %
HCT: 41.7 % (ref 40.0–52.0)
HEMOGLOBIN: 14.4 g/dL (ref 13.0–18.0)
LYMPHS ABS: 1.7 10*3/uL (ref 1.0–3.6)
LYMPHS PCT: 30 %
MCH: 32.8 pg (ref 26.0–34.0)
MCHC: 34.5 g/dL (ref 32.0–36.0)
MCV: 95 fL (ref 80.0–100.0)
MONOS PCT: 7 %
Monocytes Absolute: 0.4 10*3/uL (ref 0.2–1.0)
Neutro Abs: 3.5 10*3/uL (ref 1.4–6.5)
Neutrophils Relative %: 62 %
PLATELETS: 176 10*3/uL (ref 150–440)
RBC: 4.39 MIL/uL — AB (ref 4.40–5.90)
RDW: 13.6 % (ref 11.5–14.5)
WBC: 5.7 10*3/uL (ref 3.8–10.6)

## 2016-11-07 NOTE — Telephone Encounter (Signed)
Spoke with patient and he states that he has had no further symptoms since then and blood pressures have been fine. Reviewed lab results with him and he verbalized understanding with no further questions. Instructed him to continue monitoring his blood pressures and to please call back if any further problems.

## 2017-01-06 ENCOUNTER — Telehealth: Payer: Self-pay | Admitting: Cardiovascular Disease

## 2017-01-06 NOTE — Telephone Encounter (Signed)
Spoke with patients wife per release form and let her know that they would send [place] a clearance form. Once we receive that form Dr. Fletcher Anon will review and either provide clearance or we might call to have him come in for a visit. She verbalized understanding with no further questions at this time.

## 2017-01-06 NOTE — Telephone Encounter (Signed)
Patient has upcoming EGD  12/04/16 with Dr. Gustavo Lah at [facility] and wife wants to know if we can give cardiac clearance or does patient need to be seen   Please call wife

## 2017-02-03 ENCOUNTER — Encounter
Admission: RE | Disposition: A | Discharge status: Home / Self Care | Payer: Self-pay | Source: Ambulatory Visit | Attending: Gastroenterology

## 2017-02-03 ENCOUNTER — Encounter: Payer: Self-pay | Admitting: *Deleted

## 2017-02-03 ENCOUNTER — Ambulatory Visit: Payer: Medicare HMO | Admitting: Anesthesiology

## 2017-02-03 ENCOUNTER — Ambulatory Visit
Admission: RE | Admit: 2017-02-03 | Discharge: 2017-02-03 | Disposition: A | Discharge status: Home / Self Care | Payer: Medicare HMO | Source: Ambulatory Visit | Attending: Gastroenterology | Admitting: Gastroenterology

## 2017-02-03 DIAGNOSIS — G4733 Obstructive sleep apnea (adult) (pediatric): Secondary | ICD-10-CM | POA: Insufficient documentation

## 2017-02-03 DIAGNOSIS — K259 Gastric ulcer, unspecified as acute or chronic, without hemorrhage or perforation: Secondary | ICD-10-CM | POA: Diagnosis not present

## 2017-02-03 DIAGNOSIS — Z888 Allergy status to other drugs, medicaments and biological substances status: Secondary | ICD-10-CM | POA: Diagnosis not present

## 2017-02-03 DIAGNOSIS — I110 Hypertensive heart disease with heart failure: Secondary | ICD-10-CM | POA: Diagnosis not present

## 2017-02-03 DIAGNOSIS — I2510 Atherosclerotic heart disease of native coronary artery without angina pectoris: Secondary | ICD-10-CM | POA: Diagnosis not present

## 2017-02-03 DIAGNOSIS — Z91018 Allergy to other foods: Secondary | ICD-10-CM | POA: Diagnosis not present

## 2017-02-03 DIAGNOSIS — E785 Hyperlipidemia, unspecified: Secondary | ICD-10-CM | POA: Insufficient documentation

## 2017-02-03 DIAGNOSIS — I252 Old myocardial infarction: Secondary | ICD-10-CM | POA: Insufficient documentation

## 2017-02-03 DIAGNOSIS — M797 Fibromyalgia: Secondary | ICD-10-CM | POA: Diagnosis not present

## 2017-02-03 DIAGNOSIS — I509 Heart failure, unspecified: Secondary | ICD-10-CM | POA: Diagnosis not present

## 2017-02-03 DIAGNOSIS — K2950 Unspecified chronic gastritis without bleeding: Secondary | ICD-10-CM | POA: Insufficient documentation

## 2017-02-03 DIAGNOSIS — Z8673 Personal history of transient ischemic attack (TIA), and cerebral infarction without residual deficits: Secondary | ICD-10-CM | POA: Insufficient documentation

## 2017-02-03 DIAGNOSIS — Z79899 Other long term (current) drug therapy: Secondary | ICD-10-CM | POA: Insufficient documentation

## 2017-02-03 DIAGNOSIS — R1013 Epigastric pain: Secondary | ICD-10-CM | POA: Diagnosis present

## 2017-02-03 DIAGNOSIS — K2270 Barrett's esophagus without dysplasia: Secondary | ICD-10-CM | POA: Diagnosis not present

## 2017-02-03 DIAGNOSIS — Z885 Allergy status to narcotic agent status: Secondary | ICD-10-CM | POA: Diagnosis not present

## 2017-02-03 DIAGNOSIS — K219 Gastro-esophageal reflux disease without esophagitis: Secondary | ICD-10-CM | POA: Diagnosis not present

## 2017-02-03 DIAGNOSIS — F419 Anxiety disorder, unspecified: Secondary | ICD-10-CM | POA: Insufficient documentation

## 2017-02-03 DIAGNOSIS — Z7982 Long term (current) use of aspirin: Secondary | ICD-10-CM | POA: Insufficient documentation

## 2017-02-03 DIAGNOSIS — F329 Major depressive disorder, single episode, unspecified: Secondary | ICD-10-CM | POA: Insufficient documentation

## 2017-02-03 HISTORY — PX: ESOPHAGOGASTRODUODENOSCOPY (EGD) WITH PROPOFOL: SHX5813

## 2017-02-03 SURGERY — ESOPHAGOGASTRODUODENOSCOPY (EGD) WITH PROPOFOL
Anesthesia: General

## 2017-02-03 MED ORDER — SODIUM CHLORIDE 0.9 % IV SOLN
INTRAVENOUS | Status: DC
  Administered 2017-02-03 (×2): via INTRAVENOUS

## 2017-02-03 MED ORDER — LIDOCAINE 2% (20 MG/ML) 5 ML SYRINGE
INTRAMUSCULAR | Status: DC | PRN
  Administered 2017-02-03: 30 mg via INTRAVENOUS

## 2017-02-03 MED ORDER — FENTANYL CITRATE (PF) 100 MCG/2ML IJ SOLN
INTRAMUSCULAR | Status: AC
  Filled 2017-02-03: qty 2

## 2017-02-03 MED ORDER — PROPOFOL 10 MG/ML IV BOLUS
INTRAVENOUS | Status: DC | PRN
  Administered 2017-02-03: 100 mg via INTRAVENOUS

## 2017-02-03 MED ORDER — SODIUM CHLORIDE 0.9 % IV SOLN: INTRAVENOUS | Status: DC

## 2017-02-03 MED ORDER — PROPOFOL 500 MG/50ML IV EMUL
INTRAVENOUS | Status: DC | PRN
  Administered 2017-02-03: 140 ug/kg/min via INTRAVENOUS

## 2017-02-03 MED ORDER — PROPOFOL 500 MG/50ML IV EMUL
INTRAVENOUS | Status: AC
  Filled 2017-02-03: qty 50

## 2017-02-03 MED ORDER — FENTANYL CITRATE (PF) 100 MCG/2ML IJ SOLN
INTRAMUSCULAR | Status: DC | PRN
  Administered 2017-02-03: 50 ug via INTRAVENOUS

## 2017-02-03 NOTE — Anesthesia Postprocedure Evaluation (Signed)
Anesthesia Post Note  Patient: Willie Clark  Procedure(s) Performed: ESOPHAGOGASTRODUODENOSCOPY (EGD) WITH PROPOFOL (N/A )  Patient location during evaluation: Endoscopy Anesthesia Type: General Level of consciousness: awake and alert and oriented Pain management: pain level controlled Vital Signs Assessment: post-procedure vital signs reviewed and stable Respiratory status: spontaneous breathing, nonlabored ventilation and respiratory function stable Cardiovascular status: blood pressure returned to baseline and stable Postop Assessment: no signs of nausea or vomiting Anesthetic complications: no     Last Vitals:  Vitals:   02/03/17 1144 02/03/17 1145  BP:  118/76  Pulse: 62 62  Resp: 14 16  Temp: (!) 36.2 C (!) 36.3 C  SpO2: 99% 100%    Last Pain:  Vitals:   02/03/17 1145  TempSrc: Tympanic                 Ewelina Naves

## 2017-02-03 NOTE — Anesthesia Post-op Follow-up Note (Signed)
Anesthesia QCDR form completed.        

## 2017-02-03 NOTE — H&P (Signed)
Outpatient short stay form Pre-procedure 02/03/2017 11:17 AM Lollie Sails MD  Primary Physician: Dr. Ellin Goodie  Reason for visit:  EGD  History of present illness:  Patient is a 58 year oldale presenting today as above. He has personal history of Barrett's esophagus. 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  He presents with a recent history of a back surgery about 4-5 months ago .  He has been taking meloxicam regularly in relation to this.  H take a 81 mg asa in addition.   He takes no other asa or nsaid. He is on no prescribed blood thinner.     Current Facility-Administered Medications:  .  0.9 %  sodium chloride infusion, , Intravenous, Continuous, Lollie Sails, MD, Last Rate: 20 mL/hr at 02/03/17 1023 .  0.9 %  sodium chloride infusion, , Intravenous, Continuous, Lollie Sails, MD  Prescriptions Prior to Admission  Medication Sig Dispense Refill Last Dose  . acetaminophen (TYLENOL) 500 MG tablet Take 500 mg by mouth every 8 (eight) hours as needed for mild pain.    02/02/2017 at Unknown time  . aspirin EC 81 MG tablet Take 81 mg by mouth daily.   02/02/2017 at Unknown time  . citalopram (CELEXA) 20 MG tablet Take 20 mg by mouth daily.    02/02/2017 at Unknown time  . docusate sodium (COLACE) 100 MG capsule Take 100 mg by mouth 2 (two) times daily.   Past Week at Unknown time  . enalapril (VASOTEC) 20 MG tablet TAKE 1/2 TABLET  (10MG ) EVERY DAY 45 tablet 3 02/02/2017 at Unknown time  . fexofenadine (ALLEGRA) 180 MG tablet Take 180 mg by mouth daily.   02/02/2017 at Unknown time  . FIBER PO Take 1 tablet by mouth daily.    Past Week at Unknown time  . finasteride (PROSCAR) 5 MG tablet Take 5 mg by mouth daily.   02/02/2017 at Unknown time  . furosemide (LASIX) 40 MG tablet Take 1 tablet (40 mg total) by mouth daily as needed. 30 tablet 3 02/02/2017 at Unknown time  . gabapentin (NEURONTIN) 800 MG tablet Take 800 mg  by mouth 3 (three) times daily.   02/02/2017 at Unknown time  . meloxicam (MOBIC) 15 MG tablet Take 15 mg by mouth daily.   02/02/2017 at Unknown time  . methocarbamol (ROBAXIN) 500 MG tablet Take 500 mg by mouth 4 (four) times daily.   02/02/2017 at Unknown time  . Multiple Vitamins-Minerals (HAIR/SKIN/NAILS PO) Take 1 tablet by mouth 3 (three) times daily.    Past Week at Unknown time  . nortriptyline (PAMELOR) 25 MG capsule Take 25 mg by mouth at bedtime.    02/02/2017 at Unknown time  . pantoprazole (PROTONIX) 40 MG tablet Take 40 mg by mouth daily.    02/02/2017 at Unknown time  . potassium chloride SA (K-DUR,KLOR-CON) 20 MEQ tablet Take 20 mEq by mouth daily.   02/02/2017 at Unknown time  . rosuvastatin (CRESTOR) 20 MG tablet Take 20 mg by mouth at bedtime.    02/02/2017 at Unknown time  . Simethicone (GAS-X PO) Take 1 tablet by mouth as needed (gas).    Past Week at Unknown time  . sucralfate (CARAFATE) 1 G tablet Take 1 g by mouth 4 (four) times daily.    02/02/2017 at Unknown time  . tamsulosin (FLOMAX) 0.4 MG CAPS capsule Take 0.4 mg by mouth daily.   02/02/2017 at Unknown time  .  vitamin B-12 (CYANOCOBALAMIN) 1000 MCG tablet Take 1,000 mcg by mouth daily.   Past Week at Unknown time  . nitroGLYCERIN (NITROSTAT) 0.4 MG SL tablet Place 0.4 mg under the tongue every 5 (five) minutes as needed for chest pain.    Taking     Allergies  Allergen Reactions  . Amitriptyline     Pt unsure of what happens  . Berniece Salines Flavor Diarrhea  . Cyclobenzaprine Other (See Comments)    Pt unsure what hapens  . Fish Oil Itching  . Lidocaine Nausea And Vomiting    Nasal Spray (only time had reaction)  . Other Diarrhea    Reactive agents: Onions, bacon, steak  . Percocet [Oxycodone-Acetaminophen] Other (See Comments)    Lowers BP     Past Medical History:  Diagnosis Date  . Anxiety   . Arthritis   . Barrett's esophagus   . Bowel obstruction ([place]) 02/2009   small bowel  . CHF (congestive heart  failure) ([place])   . Chronic chest pain   . Coronary artery disease 1993   MI   . Depression   . Diverticulitis   . Dyspnea    with exertion   . Fibromyalgia   . Gastritis   . GERD (gastroesophageal reflux disease)   . Headache   . History of kidney stones   . Hyperlipidemia   . Hypertension   . Kidney stones    going to [facility]  June 26 to see kidney specialist  . Lightheadedness   . Mass on back   . MI (myocardial infarction) ([place])   . OSA (obstructive sleep apnea)   . Pulmonary embolism ([place]) 01/2006   Post-op/treated  . Skin lesions, generalized   . Sleep apnea    C-PAP  . SOB (shortness of breath)   . Stroke [facility]) 1993   right side weakness no blood thinners   on Aspirin  . Syncope and collapse   . Urticaria     Review of systems:      Physical Exam    Heart and lungs: regular rate and rhythm without rub or gallop    HEENT: normocephalic atraumatic eyes are anicteric    Other:     Pertinant exam for procedure: soft nontender nondistended bowel sounds positive and normoactive.     Planned proceedures: EGD and indicated procedures.   I have discussed the risks benefits and complications of procedures to include not limited to bleeding, infection, perforation and the risk of sedation and the patient wishes to proceed.    Lollie Sails, MD Gastroenterology 02/03/2017  11:17 AM

## 2017-02-03 NOTE — Transfer of Care (Signed)
Immediate Anesthesia Transfer of Care Note  Patient: Willie Clark  Procedure(s) Performed: ESOPHAGOGASTRODUODENOSCOPY (EGD) WITH PROPOFOL (N/A )  Patient Location: PACU and Endoscopy Unit  Anesthesia Type:General  Level of Consciousness: awake and drowsy  Airway & Oxygen Therapy: Patient Spontanous Breathing and Patient connected to nasal cannula oxygen  Post-op Assessment: Report given to RN and Post -op Vital signs reviewed and stable  Post vital signs: Reviewed and stable  Last Vitals:  Vitals:   02/03/17 0958  BP: (!) 149/66  Pulse: 66  Resp: 16  Temp: (!) 36.1 C  SpO2: 100%    Last Pain:  Vitals:   02/03/17 0958  TempSrc: Oral         Complications: No apparent anesthesia complications

## 2017-02-03 NOTE — Anesthesia Preprocedure Evaluation (Addendum)
Anesthesia Evaluation  Patient identified by MRN, date of birth, ID band Patient awake    Reviewed: Allergy & Precautions, NPO status , Patient's Chart, lab work & pertinent test results  History of Anesthesia Complications Negative for: history of anesthetic complications  Airway Mallampati: II  TM Distance: >3 FB Neck ROM: Full    Dental  (+) Poor Dentition   Pulmonary sleep apnea , neg COPD, former smoker,    breath sounds clear to auscultation- rhonchi (-) wheezing      Cardiovascular hypertension, + CAD, + Past MI, + Cardiac Stents, + CABG and +CHF (diastolic, preserved EF)   Rhythm:Regular Rate:Normal - Systolic murmurs and - Diastolic murmurs Echo 5/00/93: - Left ventricle: The cavity size was normal. There was mild   concentric hypertrophy. Systolic function was normal. The   estimated ejection fraction was in the range of 55% to 60%. Wall   motion was normal; there were no regional wall motion   abnormalities. Left ventricular diastolic function parameters   were normal. - Aortic valve: There was trivial regurgitation. - Mitral valve: There was mild regurgitation. - Left atrium: The atrium was mildly dilated. - Pulmonary arteries: Systolic pressure was within the normal   Range.  L heart cath 09/25/15:  The left ventricular systolic function is normal.  Ost RCA lesion, 100% stenosed.  SVG was injected is normal in caliber.  The graft exhibits minimal luminal irregularities.  Ost 1st Mrg to 1st Mrg lesion, 100% stenosed. The lesion was previously treated with a stent (unknown type).  Prox Cx to Mid Cx lesion, 60% stenosed. The lesion was previously treated with a stent (unknown type).  LIMA was injected is normal in caliber, and is anatomically normal.  Ost LAD lesion, 100% stenosed.  SVG was injected is large.  There is mild diffuse disease in the graft.  Origin lesion, 30% stenosed.  Dist Graft lesion,  20% stenosed.   1. Significant three-vessel coronary artery disease with native grafts including LIMA to LAD, SVG to OM 2 and SVG to right PDA. No significant change in coronary anatomy since most recent cardiac catheterization.  2. Low normal LV systolic function with ejection fraction of 50-55%. Normal left ventricular end-diastolic pressure.   Neuro/Psych  Headaches, PSYCHIATRIC DISORDERS Anxiety Depression TIACVA    GI/Hepatic Neg liver ROS, GERD  ,  Endo/Other  negative endocrine ROSneg diabetes  Renal/GU Renal disease: hx of nephrolithiasis.     Musculoskeletal  (+) Arthritis , Fibromyalgia -, narcotic dependent  Abdominal (+) - obese,   Peds  Hematology negative hematology ROS (+)   Anesthesia Other Findings Past Medical History: No date: Anxiety No date: Arthritis No date: Barrett's esophagus 02/2009: Bowel obstruction (HCC)     Comment:  small bowel No date: CHF (congestive heart failure) (HCC) No date: Chronic chest pain 1993: Coronary artery disease     Comment:  MI  No date: Depression No date: Diverticulitis No date: Dyspnea     Comment:  with exertion  No date: Fibromyalgia No date: Gastritis No date: GERD (gastroesophageal reflux disease) No date: Headache No date: History of kidney stones No date: Hyperlipidemia No date: Hypertension No date: Kidney stones     Comment:  going to [facility]  June 26 to see kidney specialist No date: Lightheadedness No date: Mass on back No date: MI (myocardial infarction) ([place]) No date: OSA (obstructive sleep apnea) 01/2006: Pulmonary embolism (HCC)     Comment:  Post-op/treated No date: Skin lesions, generalized No date: Sleep  apnea     Comment:  C-PAP No date: SOB (shortness of breath) 1993: Stroke ([place])     Comment:  right side weakness no blood thinners   on Aspirin No date: Syncope and collapse No date: Urticaria   Reproductive/Obstetrics                             Anesthesia Physical Anesthesia Plan  ASA: III  Anesthesia Plan: General   Post-op Pain Management:    Induction: Intravenous  PONV Risk Score and Plan: 1 and Propofol infusion  Airway Management Planned: Natural Airway  Additional Equipment:   Intra-op Plan:   Post-operative Plan:   Informed Consent: I have reviewed the patients History and Physical, chart, labs and discussed the procedure including the risks, benefits and alternatives for the proposed anesthesia with the patient or authorized representative who has indicated his/her understanding and acceptance.   Dental advisory given  Plan Discussed with: CRNA and Anesthesiologist  Anesthesia Plan Comments:         Anesthesia Quick Evaluation

## 2017-02-03 NOTE — Op Note (Signed)
[facility] Gastroenterology Patient Name: Willie Clark Procedure Date: 02/03/2017 11:03 AM MRN: [id number] Account #: [id number] Date of Birth: [birth year] Admit Type: Outpatient Age: 66 Room: [facility] ENDO ROOM 1 Gender: Male Note Status: Finalized Procedure:            Upper GI endoscopy Indications:          Epigastric abdominal pain, Follow-up of Barrett's                        esophagus Providers:            Lollie Sails, MD Referring MD:         Remus Blake MD, MD (Referring MD) Medicines:            Monitored Anesthesia Care Complications:        No immediate complications. Procedure:            Pre-Anesthesia Assessment:                       - ASA Grade Assessment: III - A patient with severe                        systemic disease.                       After obtaining informed consent, the endoscope was                        passed under direct vision. Throughout the procedure,                        the patient's blood pressure, pulse, and oxygen                        saturations were monitored continuously. The Endoscope                        was introduced through the mouth, and advanced to the                        third part of duodenum. The upper GI endoscopy was                        accomplished without difficulty. The patient tolerated                        the procedure well. Findings:      There were esophageal mucosal changes consistent with short-segment       Barrett's esophagus present at the gastroesophageal junction. The       maximum longitudinal extent of these mucosal changes was 1 cm in length.       Mucosa was biopsied with a cold forceps for histology in 4 quadrants at       40 cm from the incisors. One specimen bottle was sent to pathology.      The exam of the esophagus was otherwise normal.      Two non-obstructing non-bleeding cratered gastric ulcers of moderate to       significant severity with no stigmata of  bleeding were found at the       incisura. The largest lesion was 7  mm in largest dimension. Biopsies       were taken with a cold forceps for histology. Biopsies were taken with a       cold forceps for Helicobacter pylori testing.      The examined duodenum was normal.      The cardia and gastric fundus were normal on retroflexion. Impression:           - Esophageal mucosal changes consistent with                        short-segment Barrett's esophagus. Biopsied.                       - Non-obstructing non-bleeding gastric ulcers with no                        stigmata of bleeding. Suspicious for NSAID induced                        etiology. Biopsied.                       - Normal examined duodenum. Recommendation:       - Discharge patient to home.                       - Return to GI clinic in 1 month.                       - Repeat upper endoscopy in 2 months to check healing.                       - Use Protonix (pantoprazole) 40 mg PO BID daily.                       - Use sucralfate tablets 1 gram PO QID daily. Procedure Code(s):    --- Professional ---                       [phone], Esophagogastroduodenoscopy, flexible, transoral;                        with biopsy, single or multiple Diagnosis Code(s):    --- Professional ---                       K22.70, Barrett's esophagus without dysplasia                       K25.9, Gastric ulcer, unspecified as acute or chronic,                        without hemorrhage or perforation                       R10.13, Epigastric pain CPT copyright 2016 American Medical Association. All rights reserved. The codes documented in this report are preliminary and upon coder review may  be revised to meet current compliance requirements. Lollie Sails, MD 02/03/2017 11:41:42 AM This report has been signed electronically. Number of Addenda: 0 Note Initiated On: 02/03/2017 11:03 AM      [facility]

## 2017-02-04 ENCOUNTER — Encounter: Payer: Self-pay | Admitting: Gastroenterology

## 2017-02-04 LAB — SURGICAL PATHOLOGY

## 2017-04-17 ENCOUNTER — Encounter: Payer: Self-pay | Admitting: *Deleted

## 2017-04-18 ENCOUNTER — Ambulatory Visit
Admission: RE | Admit: 2017-04-18 | Discharge: 2017-04-18 | Disposition: A | Discharge status: Home / Self Care | Payer: Medicare HMO | Source: Ambulatory Visit | Attending: Gastroenterology | Admitting: Gastroenterology

## 2017-04-18 ENCOUNTER — Encounter
Admission: RE | Disposition: A | Discharge status: Home / Self Care | Payer: Self-pay | Source: Ambulatory Visit | Attending: Gastroenterology

## 2017-04-18 ENCOUNTER — Ambulatory Visit: Payer: Medicare HMO | Admitting: Registered Nurse

## 2017-04-18 ENCOUNTER — Encounter: Payer: Self-pay | Admitting: *Deleted

## 2017-04-18 DIAGNOSIS — Z7982 Long term (current) use of aspirin: Secondary | ICD-10-CM | POA: Diagnosis not present

## 2017-04-18 DIAGNOSIS — Z79899 Other long term (current) drug therapy: Secondary | ICD-10-CM | POA: Diagnosis not present

## 2017-04-18 DIAGNOSIS — Z955 Presence of coronary angioplasty implant and graft: Secondary | ICD-10-CM | POA: Insufficient documentation

## 2017-04-18 DIAGNOSIS — I10 Essential (primary) hypertension: Secondary | ICD-10-CM | POA: Insufficient documentation

## 2017-04-18 DIAGNOSIS — Z951 Presence of aortocoronary bypass graft: Secondary | ICD-10-CM | POA: Insufficient documentation

## 2017-04-18 DIAGNOSIS — Z9049 Acquired absence of other specified parts of digestive tract: Secondary | ICD-10-CM | POA: Diagnosis not present

## 2017-04-18 DIAGNOSIS — Z13810 Encounter for screening for upper gastrointestinal disorder: Secondary | ICD-10-CM | POA: Insufficient documentation

## 2017-04-18 DIAGNOSIS — K449 Diaphragmatic hernia without obstruction or gangrene: Secondary | ICD-10-CM | POA: Insufficient documentation

## 2017-04-18 DIAGNOSIS — K2950 Unspecified chronic gastritis without bleeding: Secondary | ICD-10-CM | POA: Diagnosis not present

## 2017-04-18 DIAGNOSIS — I2510 Atherosclerotic heart disease of native coronary artery without angina pectoris: Secondary | ICD-10-CM | POA: Diagnosis not present

## 2017-04-18 DIAGNOSIS — G4733 Obstructive sleep apnea (adult) (pediatric): Secondary | ICD-10-CM | POA: Diagnosis not present

## 2017-04-18 DIAGNOSIS — Z8673 Personal history of transient ischemic attack (TIA), and cerebral infarction without residual deficits: Secondary | ICD-10-CM | POA: Diagnosis not present

## 2017-04-18 DIAGNOSIS — F329 Major depressive disorder, single episode, unspecified: Secondary | ICD-10-CM | POA: Insufficient documentation

## 2017-04-18 DIAGNOSIS — K2980 Duodenitis without bleeding: Secondary | ICD-10-CM | POA: Diagnosis not present

## 2017-04-18 DIAGNOSIS — Z86711 Personal history of pulmonary embolism: Secondary | ICD-10-CM | POA: Insufficient documentation

## 2017-04-18 DIAGNOSIS — E785 Hyperlipidemia, unspecified: Secondary | ICD-10-CM | POA: Insufficient documentation

## 2017-04-18 DIAGNOSIS — I252 Old myocardial infarction: Secondary | ICD-10-CM | POA: Insufficient documentation

## 2017-04-18 DIAGNOSIS — Z87891 Personal history of nicotine dependence: Secondary | ICD-10-CM | POA: Insufficient documentation

## 2017-04-18 DIAGNOSIS — K219 Gastro-esophageal reflux disease without esophagitis: Secondary | ICD-10-CM | POA: Diagnosis not present

## 2017-04-18 DIAGNOSIS — Z8711 Personal history of peptic ulcer disease: Secondary | ICD-10-CM | POA: Diagnosis not present

## 2017-04-18 HISTORY — PX: ESOPHAGOGASTRODUODENOSCOPY (EGD) WITH PROPOFOL: SHX5813

## 2017-04-18 HISTORY — DX: Diverticulosis of intestine, part unspecified, without perforation or abscess without bleeding: K57.90

## 2017-04-18 HISTORY — DX: Unspecified hemorrhoids: K64.9

## 2017-04-18 HISTORY — DX: Gastric ulcer, unspecified as acute or chronic, without hemorrhage or perforation: K25.9

## 2017-04-18 SURGERY — ESOPHAGOGASTRODUODENOSCOPY (EGD) WITH PROPOFOL
Anesthesia: General

## 2017-04-18 MED ORDER — SODIUM CHLORIDE 0.9 % IV SOLN: INTRAVENOUS | Status: DC

## 2017-04-18 MED ORDER — LIDOCAINE HCL (PF) 1 % IJ SOLN
INTRAMUSCULAR | Status: AC
  Filled 2017-04-18: qty 2

## 2017-04-18 MED ORDER — PROPOFOL 500 MG/50ML IV EMUL
INTRAVENOUS | Status: AC
  Filled 2017-04-18: qty 50

## 2017-04-18 MED ORDER — PROPOFOL 500 MG/50ML IV EMUL
INTRAVENOUS | Status: DC | PRN
  Administered 2017-04-18: 140 ug/kg/min via INTRAVENOUS

## 2017-04-18 MED ORDER — GLYCOPYRROLATE 0.2 MG/ML IJ SOLN
INTRAMUSCULAR | Status: AC
  Filled 2017-04-18: qty 1

## 2017-04-18 MED ORDER — FENTANYL CITRATE (PF) 100 MCG/2ML IJ SOLN
INTRAMUSCULAR | Status: DC | PRN
  Administered 2017-04-18 (×2): 25 ug via INTRAVENOUS

## 2017-04-18 MED ORDER — FENTANYL CITRATE (PF) 100 MCG/2ML IJ SOLN
INTRAMUSCULAR | Status: AC
  Filled 2017-04-18: qty 2

## 2017-04-18 MED ORDER — PROPOFOL 10 MG/ML IV BOLUS
INTRAVENOUS | Status: DC | PRN
  Administered 2017-04-18: 70 mg via INTRAVENOUS

## 2017-04-18 MED ORDER — SODIUM CHLORIDE 0.9 % IV SOLN
INTRAVENOUS | Status: DC
  Administered 2017-04-18: 13:00:00 via INTRAVENOUS

## 2017-04-18 MED ORDER — GLYCOPYRROLATE 0.2 MG/ML IJ SOLN
INTRAMUSCULAR | Status: DC | PRN
  Administered 2017-04-18: 0.2 mg via INTRAVENOUS

## 2017-04-18 MED ORDER — PROPOFOL 500 MG/50ML IV EMUL
INTRAVENOUS | Status: AC
Start: 2017-04-18 — End: 2017-04-18
  Filled 2017-04-18: qty 50

## 2017-04-18 NOTE — H&P (Signed)
Outpatient short stay form Pre-procedure 04/18/2017 1:35 PM Lollie Sails MD  Primary Physician: Dr. Ellin Goodie  Reason for visit:  EGD  History of present illness:  Patient is a 66 year old male presenting today as above. He has a history of multiple gastric ulcers over a long period of time. These have been very difficult to heal at times. He was last seen about 2-3 months ago with finding of a ulcer on the incisura. He had however been taking meloxicam at that time. That agent has been discontinued. He has continued with 81 mg aspirin and twice a day PPI. As well as Carafate. He does have a history of Barrett's without dysplasia those biopsies being done also on the last procedure. Ascending today for follow-up evaluation of the gastric ulcer. He takes no other blood thinning agent.    Current Facility-Administered Medications:  .  0.9 %  sodium chloride infusion, , Intravenous, Continuous, Lollie Sails, MD, Last Rate: 20 mL/hr at 04/18/17 1257 .  0.9 %  sodium chloride infusion, , Intravenous, Continuous, Lollie Sails, MD .  0.9 %  sodium chloride infusion, , Intravenous, Continuous, Lollie Sails, MD .  lidocaine (PF) (XYLOCAINE) 1 % injection, , , ,   Medications Prior to Admission  Medication Sig Dispense Refill Last Dose  . acetaminophen (TYLENOL) 500 MG tablet Take 500 mg by mouth every 8 (eight) hours as needed for mild pain.    04/17/2017 at Unknown time  . aspirin EC 81 MG tablet Take 81 mg by mouth daily.   Past Week at Unknown time  . citalopram (CELEXA) 20 MG tablet Take 20 mg by mouth daily.    04/17/2017 at Unknown time  . docusate sodium (COLACE) 100 MG capsule Take 100 mg by mouth 2 (two) times daily.   04/17/2017 at Unknown time  . enalapril (VASOTEC) 20 MG tablet TAKE 1/2 TABLET  (10MG ) EVERY DAY 45 tablet 3 04/17/2017 at Unknown time  . fexofenadine (ALLEGRA) 180 MG tablet Take 180 mg by mouth daily.   04/17/2017 at Unknown time  . FIBER PO Take  1 tablet by mouth daily.    04/17/2017 at Unknown time  . finasteride (PROSCAR) 5 MG tablet Take 5 mg by mouth daily.   04/17/2017 at Unknown time  . furosemide (LASIX) 40 MG tablet Take 1 tablet (40 mg total) by mouth daily as needed. 30 tablet 3 04/17/2017 at Unknown time  . gabapentin (NEURONTIN) 800 MG tablet Take 800 mg by mouth 3 (three) times daily.   04/17/2017 at Unknown time  . meloxicam (MOBIC) 15 MG tablet Take 15 mg by mouth daily.   04/17/2017 at Unknown time  . methocarbamol (ROBAXIN) 500 MG tablet Take 500 mg by mouth 4 (four) times daily.   04/17/2017 at Unknown time  . metoprolol succinate (TOPROL-XL) 25 MG 24 hr tablet Take 25 mg by mouth daily.   04/17/2017 at Unknown time  . Multiple Vitamins-Minerals (HAIR/SKIN/NAILS PO) Take 1 tablet by mouth 3 (three) times daily.    04/17/2017 at Unknown time  . nitroGLYCERIN (NITROSTAT) 0.4 MG SL tablet Place 0.4 mg under the tongue every 5 (five) minutes as needed for chest pain.    Past Month at Unknown time  . nortriptyline (PAMELOR) 25 MG capsule Take 25 mg by mouth at bedtime.    04/17/2017 at Unknown time  . pantoprazole (PROTONIX) 40 MG tablet Take 40 mg by mouth daily.    04/17/2017 at Unknown time  . potassium  chloride SA (K-DUR,KLOR-CON) 20 MEQ tablet Take 20 mEq by mouth daily.   04/17/2017 at Unknown time  . rosuvastatin (CRESTOR) 20 MG tablet Take 20 mg by mouth at bedtime.    04/17/2017 at Unknown time  . Simethicone (GAS-X PO) Take 1 tablet by mouth as needed (gas).    04/17/2017 at Unknown time  . sucralfate (CARAFATE) 1 G tablet Take 1 g by mouth 4 (four) times daily.    04/17/2017 at Unknown time  . tamsulosin (FLOMAX) 0.4 MG CAPS capsule Take 0.4 mg by mouth daily.   04/17/2017 at Unknown time  . topiramate (TOPAMAX) 50 MG tablet Take 50 mg by mouth 2 (two) times daily.   04/17/2017 at Unknown time  . vitamin B-12 (CYANOCOBALAMIN) 1000 MCG tablet Take 1,000 mcg by mouth daily.   04/17/2017 at Unknown time      Allergies  Allergen Reactions  . Amitriptyline     Pt unsure of what happens  . Berniece Salines Flavor Diarrhea  . Cyclobenzaprine Other (See Comments)    Pt unsure what hapens  . Fish Oil Itching  . Lidocaine Nausea And Vomiting    Nasal Spray (only time had reaction)  . Other Diarrhea    Reactive agents: Onions, bacon, steak  . Percocet [Oxycodone-Acetaminophen] Other (See Comments)    Lowers BP     Past Medical History:  Diagnosis Date  . Anxiety   . Arthritis   . Barrett's esophagus   . Bowel obstruction ([place]) 02/2009   small bowel  . CHF (congestive heart failure) ([place])   . Chronic chest pain   . Coronary artery disease 1993   MI   . Depression   . Diverticulitis   . Diverticulosis   . Dyspnea    with exertion   . Fibromyalgia   . Gastric ulcer   . Gastritis   . GERD (gastroesophageal reflux disease)   . Headache   . Hemorrhoids   . History of kidney stones   . Hyperlipidemia   . Hypertension   . Kidney stones    going to [facility]  June 26 to see kidney specialist  . Lightheadedness   . Mass on back   . MI (myocardial infarction) ([place])   . OSA (obstructive sleep apnea)   . Pulmonary embolism ([place]) 01/2006   Post-op/treated  . Skin lesions, generalized   . Sleep apnea    C-PAP  . SOB (shortness of breath)   . Stroke [facility]) 1993   right side weakness no blood thinners   on Aspirin  . Syncope and collapse   . Urticaria     Review of systems:      Physical Exam    Heart and lungs: Regular rate and rhythm without rub or gallop, lungs are bilaterally clear.    HEENT: Normocephalic atraumatic eyes are anicteric    Other:     Pertinant exam for procedure: Soft mild discomfort to palpation the epigastric region and towards the umbilicus. There are no masses or rebound. Bowel sounds are positive normoactive.    Planned proceedures: EGD and indicated procedures. I have discussed the risks benefits and complications of procedures to include not  limited to bleeding, infection, perforation and the risk of sedation and the patient wishes to proceed.    Lollie Sails, MD Gastroenterology 04/18/2017  1:35 PM

## 2017-04-18 NOTE — Anesthesia Preprocedure Evaluation (Signed)
Anesthesia Evaluation  Patient identified by MRN, date of birth, ID band Patient awake    Reviewed: Allergy & Precautions, H&P , NPO status , Patient's Chart, lab work & pertinent test results, reviewed documented beta blocker date and time   Airway Mallampati: II   Neck ROM: full    Dental  (+) Poor Dentition   Pulmonary neg pulmonary ROS, shortness of breath and with exertion, sleep apnea , former smoker,    Pulmonary exam normal        Cardiovascular Exercise Tolerance: Poor hypertension, + CAD, + Past MI and +CHF  negative cardio ROS Normal cardiovascular exam Rhythm:regular Rate:Normal     Neuro/Psych  Headaches, PSYCHIATRIC DISORDERS TIA Neuromuscular disease CVA negative neurological ROS  negative psych ROS   GI/Hepatic negative GI ROS, Neg liver ROS, PUD, GERD  Medicated,  Endo/Other  negative endocrine ROS  Renal/GU Renal diseasenegative Renal ROS  negative genitourinary   Musculoskeletal   Abdominal   Peds  Hematology negative hematology ROS (+)   Anesthesia Other Findings Past Medical History: No date: Anxiety No date: Arthritis No date: Barrett's esophagus 02/2009: Bowel obstruction (HCC)     Comment:  small bowel No date: CHF (congestive heart failure) (HCC) No date: Chronic chest pain 1993: Coronary artery disease     Comment:  MI  No date: Depression No date: Diverticulitis No date: Diverticulosis No date: Dyspnea     Comment:  with exertion  No date: Fibromyalgia No date: Gastric ulcer No date: Gastritis No date: GERD (gastroesophageal reflux disease) No date: Headache No date: Hemorrhoids No date: History of kidney stones No date: Hyperlipidemia No date: Hypertension No date: Kidney stones     Comment:  going to [facility]  June 26 to see kidney specialist No date: Lightheadedness No date: Mass on back No date: MI (myocardial infarction) ([place]) No date: OSA (obstructive sleep  apnea) 01/2006: Pulmonary embolism (HCC)     Comment:  Post-op/treated No date: Skin lesions, generalized No date: Sleep apnea     Comment:  C-PAP No date: SOB (shortness of breath) 1993: Stroke ([place])     Comment:  right side weakness no blood thinners   on Aspirin No date: Syncope and collapse No date: Urticaria Past Surgical History: No date: BACK SURGERY 01/2009: bowel obstruction 11/2006: CARDIAC CATHETERIZATION     Comment:  patent grafts. Significant OM3 disease and occluded               diagonals. 06/2010: CARDIAC CATHETERIZATION     Comment:  patent grafts. significant ISR in proximal LCX but OM2               is bypassed and gives retrograde flow to OM3.  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 LIMA to LAD, SVG to OM2 and SVG to RPDA 09/24/2015: CYSTOSCOPY WITH STENT PLACEMENT; Bilateral     Comment:  Procedure: [place],               BILATERAL STENT PLACEMENT;  Surgeon: Festus Aloe,               MD;  Location: [facility] ORS;  Service: Urology;  Laterality:               Bilateral; 10/25/2014: ESOPHAGOGASTRODUODENOSCOPY; N/A     Comment:  Procedure: ESOPHAGOGASTRODUODENOSCOPY (EGD);  Surgeon:               Lollie Sails, MD;  Location: [facility] ENDOSCOPY;                Service: Endoscopy;  Laterality: N/A; 02/03/2017:  ESOPHAGOGASTRODUODENOSCOPY (EGD) WITH PROPOFOL; N/A     Comment:  Procedure: ESOPHAGOGASTRODUODENOSCOPY (EGD) WITH               PROPOFOL;  Surgeon: Lollie Sails, MD;  Location:               [facility] ENDOSCOPY;  Service: Endoscopy;  Laterality: N/A; No date: EYE SURGERY; Bilateral     Comment:  Cataract Extraction with IOL No date: HERNIA REPAIR 01/23/2016: INGUINAL HERNIA REPAIR; Bilateral     Comment:  Procedure: HERNIA REPAIR INGUINAL ADULT BILATERAL;                Surgeon: Leonie Green, MD;  Location: [facility] ORS;                Service: General;  Laterality: Bilateral; 10/01/2016: LUMBAR LAMINECTOMY/DECOMPRESSION MICRODISCECTOMY; Left     Comment:  Procedure: Left Lumbar four-five Laminotomy for               resection of synovial cyst;  Surgeon: Erline Levine, MD;               Location: [place];  Service: Neurosurgery;  Laterality:               Left;  left 06/2009: NECK SURGERY 2010: SHOULDER SURGERY BMI    Body Mass Index:  28.50 kg/m     Reproductive/Obstetrics negative OB ROS                             Anesthesia Physical Anesthesia Plan  ASA: IV  Anesthesia Plan: General   Post-op Pain Management:    Induction:   PONV Risk Score and Plan:   Airway Management Planned:   Additional Equipment:   Intra-op Plan:   Post-operative Plan:   Informed Consent: I have reviewed the patients History and Physical, chart, labs and discussed the procedure including the risks, benefits and alternatives for the proposed anesthesia with the patient or authorized representative who has indicated his/her understanding and acceptance.   Dental Advisory Given  Plan Discussed with: CRNA  Anesthesia Plan Comments:         Anesthesia Quick Evaluation

## 2017-04-18 NOTE — Anesthesia Post-op Follow-up Note (Signed)
Anesthesia QCDR form completed.        

## 2017-04-18 NOTE — Transfer of Care (Signed)
Immediate Anesthesia Transfer of Care Note  Patient: Willie Clark  Procedure(s) Performed: ESOPHAGOGASTRODUODENOSCOPY (EGD) WITH PROPOFOL (N/A )  Patient Location: PACU  Anesthesia Type:General  Level of Consciousness: awake, alert  and oriented  Airway & Oxygen Therapy: Patient Spontanous Breathing  Post-op Assessment: report given to RN, VSS  Post vital signs: Reviewed and stable  Last Vitals:  Vitals:   04/18/17 1233 04/18/17 1403  BP: (!) 150/84 (P) 111/85  Pulse: 68 92  Resp: 20 15  Temp: (!) 35.9 C (!) (P) 36.1 C  SpO2: 100% 96%    Last Pain:  Vitals:   04/18/17 1403  TempSrc: (P) Tympanic         Complications: No apparent anesthesia complications

## 2017-04-18 NOTE — Op Note (Signed)
[facility] Gastroenterology Patient Name: Willie Clark Procedure Date: 04/18/2017 1:36 PM MRN: [id number] Account #: [id number] Date of Birth: June 26, 1950 Admit Type: Outpatient Age: 66 Room: [facility] ENDO ROOM 1 Gender: Male Note Status: Finalized Procedure:            Upper GI endoscopy Indications:          Follow-up of chronic gastric ulcer Providers:            Lollie Sails, MD Referring MD:         No Local Md, MD (Referring MD) Medicines:            Monitored Anesthesia Care Complications:        No immediate complications. Procedure:            Pre-Anesthesia Assessment:                       - ASA Grade Assessment: III - A patient with severe                        systemic disease.                       After obtaining informed consent, the endoscope was                        passed under direct vision. Throughout the procedure,                        the patient's blood pressure, pulse, and oxygen                        saturations were monitored continuously. The Endoscope                        was introduced through the mouth, and advanced to the                        third part of duodenum. The upper GI endoscopy was                        accomplished without difficulty. The patient tolerated                        the procedure well. Findings:      There were esophageal mucosal changes consistent with short-segment       Barrett's esophagus present in the lower third of the esophagus. The       maximum longitudinal extent of these mucosal changes was 1 cm in length.      The previously seen gastric ulcer on the incisura is noted. There is no       ulcer crater, though still mucosal inflamation in an area about 3cm x       1.3 cm along the incisura. Multiple biopsies were taken of this site.      Mild inflammation characterized by erythema was found in the gastric       body, along the top of several gastric folds without ulceration.   Biopsies were taken with a cold forceps for histology.      The cardia and gastric fundus were normal on retroflexion.      Patulous GE  junction/small hiatal hernia noted.      Localized mild mucosal variance characterized by nodularity was found in       the duodenal bulb. Biopsies were taken with a cold forceps for       histology. Biopsies also takne from the second to third portion of the       duodenum for histology. Impression:           - Esophageal mucosal changes consistent with                        short-segment Barrett's esophagus.                       - Gastritis. Biopsied.                       - Mucosal variant in the duodenum. Biopsied. Recommendation:       - Discharge patient to home.                       - Continue present medications.                       - Check gastrin today.                       - Consider referral for gastric PH testing. Procedure Code(s):    --- Professional ---                       [phone], Esophagogastroduodenoscopy, flexible, transoral;                        with biopsy, single or multiple Diagnosis Code(s):    --- Professional ---                       K22.8, Other specified diseases of esophagus                       K29.70, Gastritis, unspecified, without bleeding                       K31.89, Other diseases of stomach and duodenum                       K25.7, Chronic gastric ulcer without hemorrhage or                        perforation CPT copyright 2016 American Medical Association. All rights reserved. The codes documented in this report are preliminary and upon coder review may  be revised to meet current compliance requirements. Lollie Sails, MD 04/18/2017 2:07:04 PM This report has been signed electronically. Number of Addenda: 0 Note Initiated On: 04/18/2017 1:36 PM      [facility]

## 2017-04-18 NOTE — Anesthesia Postprocedure Evaluation (Signed)
Anesthesia Post Note  Patient: Willie Clark  Procedure(s) Performed: ESOPHAGOGASTRODUODENOSCOPY (EGD) WITH PROPOFOL (N/A )  Patient location during evaluation: Endoscopy Anesthesia Type: General Level of consciousness: awake and alert Pain management: pain level controlled Vital Signs Assessment: post-procedure vital signs reviewed and stable Respiratory status: spontaneous breathing, nonlabored ventilation, respiratory function stable and patient connected to nasal cannula oxygen Cardiovascular status: blood pressure returned to baseline and stable Postop Assessment: no apparent nausea or vomiting Anesthetic complications: no     Last Vitals:  Vitals:   04/18/17 1233 04/18/17 1403  BP: (!) 150/84 111/85  Pulse: 68 92  Resp: 20 15  Temp: (!) 35.9 C (!) 36.1 C  SpO2: 100% 96%    Last Pain:  Vitals:   04/18/17 1413  TempSrc:   PainSc: 0-No pain                 Martha Clan

## 2017-04-20 LAB — GASTRIN: Gastrin: 91 pg/mL (ref 0–115)

## 2017-04-21 ENCOUNTER — Encounter: Payer: Self-pay | Admitting: Gastroenterology

## 2017-04-24 LAB — SURGICAL PATHOLOGY

## 2017-05-19 ENCOUNTER — Other Ambulatory Visit: Payer: Self-pay | Admitting: Neurosurgery

## 2017-05-19 DIAGNOSIS — M5416 Radiculopathy, lumbar region: Secondary | ICD-10-CM

## 2017-05-27 ENCOUNTER — Ambulatory Visit
Admission: RE | Admit: 2017-05-27 | Discharge: 2017-05-27 | Disposition: A | Discharge status: Home / Self Care | Payer: Medicare HMO | Source: Ambulatory Visit | Attending: Neurosurgery | Admitting: Neurosurgery

## 2017-05-27 DIAGNOSIS — M4686 Other specified inflammatory spondylopathies, lumbar region: Secondary | ICD-10-CM | POA: Insufficient documentation

## 2017-05-27 DIAGNOSIS — M8938 Hypertrophy of bone, other site: Secondary | ICD-10-CM | POA: Insufficient documentation

## 2017-05-27 DIAGNOSIS — M48061 Spinal stenosis, lumbar region without neurogenic claudication: Secondary | ICD-10-CM | POA: Diagnosis not present

## 2017-05-27 DIAGNOSIS — M5416 Radiculopathy, lumbar region: Secondary | ICD-10-CM | POA: Insufficient documentation

## 2017-05-27 LAB — POCT I-STAT CREATININE: CREATININE: 1 mg/dL (ref 0.61–1.24)

## 2017-05-27 MED ORDER — GADOBENATE DIMEGLUMINE 529 MG/ML IV SOLN
18.0000 mL | Freq: Once | INTRAVENOUS | Status: AC | PRN
  Administered 2017-05-27: 20 mL via INTRAVENOUS

## 2017-08-26 ENCOUNTER — Ambulatory Visit: Payer: Medicare HMO | Admitting: Cardiovascular Disease

## 2017-08-26 ENCOUNTER — Encounter: Payer: Self-pay | Admitting: Cardiovascular Disease

## 2017-08-26 VITALS — BP 130/70 | HR 66 | Ht 69.0 in | Wt 195.2 lb

## 2017-08-26 DIAGNOSIS — I779 Disorder of arteries and arterioles, unspecified: Secondary | ICD-10-CM | POA: Diagnosis not present

## 2017-08-26 DIAGNOSIS — I10 Essential (primary) hypertension: Secondary | ICD-10-CM | POA: Diagnosis not present

## 2017-08-26 DIAGNOSIS — E785 Hyperlipidemia, unspecified: Secondary | ICD-10-CM | POA: Diagnosis not present

## 2017-08-26 DIAGNOSIS — I25118 Atherosclerotic heart disease of native coronary artery with other forms of angina pectoris: Secondary | ICD-10-CM

## 2017-08-26 DIAGNOSIS — I739 Peripheral vascular disease, unspecified: Secondary | ICD-10-CM

## 2017-08-26 MED ORDER — SILDENAFIL CITRATE 25 MG PO TABS: 25.0000 mg | ORAL_TABLET | Freq: Every day | ORAL | 2 refills | Status: DC | PRN

## 2017-08-26 NOTE — Patient Instructions (Addendum)
Medication Instructions: - Your physician has recommended you make the following change in your medication:   1) START Viagra 25 mg- take 1 tablet 30 minutes prior to activity as needed  Labwork: - none ordered  Procedures/Testing: - none ordered  Follow-Up: - Your physician wants you to follow-up in: 6 months with Dr. Fletcher Anon.  You will receive a reminder letter in the mail two months in advance. If you don't receive a letter, please call our office to schedule the follow-up appointment.   Any Additional Special Instructions Will Be Listed Below (If Applicable).     If you need a refill on your cardiac medications before your next appointment, please call your pharmacy.

## 2017-08-26 NOTE — Progress Notes (Signed)
Cardiology Office Note   Date:  08/26/2017   ID:  Willie Clark, DOB 13-Sep-1950, MRN [id number]  PCP:  [name], MD  Cardiologist:   [name], MD   Chief Complaint  Patient presents with  . other    6 month follow up. patient c/o chest pain and SOB.  meds reviewed verbally with patient.       History of Present Illness: Willie Clark is a 67 y.o. male who presents for  a followup visit. He has known history of coronary artery disease. He is status post coronary artery bypass graft surgery in 1993 and redo in 2007.   He suffers from fibromyalgia and chronic pain.  He had GI symptoms in 2015 due to documented gastric ulcer. He had neck surgery in 6269 without complications.   Cardiac catheterization in June, 2017 showed significant underlying three-vessel disease with patent grafts and no change in coronary anatomy since most recent cardiac catheterization. Ejection fraction was 50-55% with normal left ventricular end-diastolic pressure.  He has chronic dizziness due to orthostatic hypotension that has improved after stopping metoprolol.  He reports stable symptoms of occasional chest pain and shortness of breath.  He reports erectile dysfunction for about 1 year now.  Past Medical History:  Diagnosis Date  . Anxiety   . Arthritis   . Barrett's esophagus   . Bowel obstruction ([place]) 02/2009   small bowel  . CHF (congestive heart failure) ([address])   . Chronic chest pain   . Coronary artery disease 1993   MI   . Depression   . Diverticulitis   . Diverticulosis   . Dyspnea    with exertion   . Fibromyalgia   . Gastric ulcer   . Gastritis   . GERD (gastroesophageal reflux disease)   . Headache   . Hemorrhoids   . History of kidney stones   . Hyperlipidemia   . Hypertension   . Kidney stones    going to [facility]  June 26 to see kidney specialist  . Lightheadedness   . Mass on back   . MI (myocardial infarction) ([place])   . OSA (obstructive sleep apnea)     . Pulmonary embolism ([place]) 01/2006   Post-op/treated  . Skin lesions, generalized   . Sleep apnea    C-PAP  . SOB (shortness of breath)   . Stroke [facility]) 1993   right side weakness no blood thinners   on Aspirin  . Syncope and collapse   . Urticaria     Past Surgical History:  Procedure Laterality Date  . BACK SURGERY    . bowel obstruction  01/2009  . CARDIAC CATHETERIZATION  11/2006   patent grafts. Significant OM3 disease and occluded diagonals.  [name] CARDIAC CATHETERIZATION  06/2010   patent grafts. significant ISR in proximal LCX but OM2 is bypassed and gives retrograde flow to OM3.   [name] CARDIAC CATHETERIZATION  06/2013   [facility]: Patent grafts with 60% proximal in-stent restenosis in the left circumflex with FFR of 0.85  . CARDIAC CATHETERIZATION Left 09/25/2015   Procedure: Left Heart Cath and Cors/Grafts Angiography;  Surgeon: Wellington Hampshire, MD;  Location: [place] CV LAB;  Service: Cardiovascular;  Laterality: Left;  . CERVICAL FUSION    . CHOLECYSTECTOMY    . COLON SURGERY  01/2009   BOWEL RESECTION DUE TO SMALL BOWEL OBSTRUCTION  . COLONOSCOPY    . CORONARY ANGIOPLASTY  2006  . CORONARY ARTERY BYPASS GRAFT  1993/01/2006   redo at [facility]. LIMA to LAD, SVG to OM2 and SVG to RPDA  . CYSTOSCOPY WITH STENT PLACEMENT Bilateral 09/24/2015   Procedure: CYSTOSCOPY WITH BILATERAL RETROGRADES, BILATERAL STENT PLACEMENT;  Surgeon: Festus Aloe, MD;  Location: [facility] ORS;  Service: Urology;  Laterality: Bilateral;  . ESOPHAGOGASTRODUODENOSCOPY N/A 10/25/2014   Procedure: ESOPHAGOGASTRODUODENOSCOPY (EGD);  Surgeon: Lollie Sails, MD;  Location: [facility] ENDOSCOPY;  Service: Endoscopy;  Laterality: N/A;  . ESOPHAGOGASTRODUODENOSCOPY (EGD) WITH PROPOFOL N/A 02/03/2017   Procedure: ESOPHAGOGASTRODUODENOSCOPY (EGD) WITH PROPOFOL;  Surgeon: Lollie Sails, MD;  Location: [facility] ENDOSCOPY;  Service: Endoscopy;  Laterality: N/A;  . ESOPHAGOGASTRODUODENOSCOPY (EGD) WITH PROPOFOL N/A 04/18/2017    Procedure: ESOPHAGOGASTRODUODENOSCOPY (EGD) WITH PROPOFOL;  Surgeon: Lollie Sails, MD;  Location: [facility] ENDOSCOPY;  Service: Endoscopy;  Laterality: N/A;  . EYE SURGERY Bilateral    Cataract Extraction with IOL  . HERNIA REPAIR    . INGUINAL HERNIA REPAIR Bilateral 01/23/2016   Procedure: HERNIA REPAIR INGUINAL ADULT BILATERAL;  Surgeon: Leonie Green, MD;  Location: [facility] ORS;  Service: General;  Laterality: Bilateral;  . LUMBAR LAMINECTOMY/DECOMPRESSION MICRODISCECTOMY Left 10/01/2016   Procedure: Left Lumbar four-five Laminotomy for resection of synovial cyst;  Surgeon: Erline Levine, MD;  Location: [place];  Service: Neurosurgery;  Laterality: Left;  left  . NECK SURGERY  06/2009  . SHOULDER SURGERY  2010     Current Outpatient Medications  Medication Sig Dispense Refill  . acetaminophen (TYLENOL) 500 MG tablet Take 500 mg by mouth every 8 (eight) hours as needed for mild pain.     [name] aspirin EC 81 MG tablet Take 81 mg by mouth daily.    . citalopram (CELEXA) 20 MG tablet Take 20 mg by mouth daily.     [name] docusate sodium (COLACE) 100 MG capsule Take 100 mg by mouth 2 (two) times daily.    . enalapril (VASOTEC) 20 MG tablet TAKE 1/2 TABLET  (10MG ) EVERY DAY 45 tablet 3  . fexofenadine (ALLEGRA) 180 MG tablet Take 180 mg by mouth daily.    [name] FIBER PO Take 1 tablet by mouth daily.     . finasteride (PROSCAR) 5 MG tablet Take 5 mg by mouth daily.    . furosemide (LASIX) 40 MG tablet Take 1 tablet (40 mg total) by mouth daily as needed. 30 tablet 3  . gabapentin (NEURONTIN) 800 MG tablet Take 800 mg by mouth 3 (three) times daily.    . meloxicam (MOBIC) 15 MG tablet Take 15 mg by mouth daily.    . methocarbamol (ROBAXIN) 500 MG tablet Take 500 mg by mouth 4 (four) times daily.    . Multiple Vitamins-Minerals (HAIR/SKIN/NAILS PO) Take 1 tablet by mouth 3 (three) times daily.     . nitroGLYCERIN (NITROSTAT) 0.4 MG SL tablet Place 0.4 mg under the tongue every 5 (five) minutes as needed  for chest pain.     . nortriptyline (PAMELOR) 25 MG capsule Take 25 mg by mouth at bedtime.     . pantoprazole (PROTONIX) 40 MG tablet Take 40 mg by mouth daily.     . potassium chloride SA (K-DUR,KLOR-CON) 20 MEQ tablet Take 20 mEq by mouth daily.    . rosuvastatin (CRESTOR) 20 MG tablet Take 20 mg by mouth at bedtime.     . Simethicone (GAS-X PO) Take 1 tablet by mouth as needed (gas).     . sucralfate (CARAFATE) 1 G tablet Take 1 g by mouth 4 (four) times daily.     [name]  tamsulosin (FLOMAX) 0.4 MG CAPS capsule Take 0.4 mg by mouth daily.    [name] topiramate (TOPAMAX) 50 MG tablet Take 50 mg by mouth 2 (two) times daily.    . vitamin B-12 (CYANOCOBALAMIN) 1000 MCG tablet Take 1,000 mcg by mouth daily.     No current facility-administered medications for this visit.     Allergies:   Amitriptyline; Berniece Salines flavor; Cyclobenzaprine; Fish oil; Lidocaine; Other; and Percocet [oxycodone-acetaminophen]    Social History:  The patient  reports that he quit smoking about 26 years ago. His smoking use included cigarettes. He smoked 2.50 packs per day. He has never used smokeless tobacco. He reports that he drank alcohol. He reports that he does not use drugs.   Family History:  The patient's family history includes Heart attack in his father; Heart disease (age of onset: 75) in his father.    ROS:  Please see the history of present illness.   Otherwise, review of systems are positive for none.   All other systems are reviewed and negative.    PHYSICAL EXAM: VS:  BP 130/70 (BP Location: Left Arm, Patient Position: Sitting, Cuff Size: Normal)   Pulse 66   Ht 5\' 9"  (1.753 m)   Wt 195 lb 4 oz (88.6 kg)   BMI 28.83 kg/m  , BMI Body mass index is 28.83 kg/m. GEN: Well nourished, well developed, in no acute distress  HEENT: normal  Neck: no JVD, carotid bruits, or masses Cardiac: RRR; no murmurs, rubs, or gallops,no edema  Respiratory:  clear to auscultation bilaterally, normal work of breathing GI:  soft, nontender, nondistended, + BS MS: no deformity or atrophy  Skin: warm and dry, no rash Neuro:  Strength and sensation are intact Psych: euthymic mood, full affect   EKG:  EKG is   ordered today. EKG showed normal sinus rhythm with old septal infarct.  No significant ST or T wave changes.   Recent Labs: 11/06/2016: BUN 11; Hemoglobin 14.4; Platelets 176; Potassium 3.9; Sodium 139 05/27/2017: Creatinine, Ser 1.00    Lipid Panel No results found for: CHOL, TRIG, HDL, CHOLHDL, VLDL, LDLCALC, LDLDIRECT    Wt Readings from Last 3 Encounters:  08/26/17 195 lb 4 oz (88.6 kg)  04/18/17 193 lb (87.5 kg)  02/03/17 196 lb (88.9 kg)         ASSESSMENT AND PLAN:  1.  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  Continue medical therapy.     2. Essential hypertension: Blood pressure is well controlled on current medications.Hypotension resolved.  He is currently on enalapril.  3. Chronic venous insufficiency: He uses furosemide very infrequently.  4. Hyperlipidemia: Continue current dose of rosuvastatin with a target LDL of less than 70.  5. Mild carotid disease: Previous carotid Doppler showed less than 40% stenosis bilaterally. No need to repeat.  6.  Erectile dysfunction: I agreed to provide him small dose sildenafil 25 mg to be used as needed.  I instructed him not to take this at the same time with nitroglycerin.  Disposition:   FU with me in 6 months  Signed,  [name], MD  08/26/2017 1:48 PM    [facility]

## 2017-10-28 ENCOUNTER — Ambulatory Visit
Admission: RE | Admit: 2017-10-28 | Discharge: 2017-10-28 | Disposition: A | Discharge status: Home / Self Care | Payer: Medicare HMO | Source: Ambulatory Visit | Attending: Gastroenterology | Admitting: Gastroenterology

## 2017-10-28 ENCOUNTER — Ambulatory Visit: Payer: Medicare HMO | Admitting: Anesthesiology

## 2017-10-28 ENCOUNTER — Encounter: Payer: Self-pay | Admitting: Anesthesiology

## 2017-10-28 ENCOUNTER — Encounter
Admission: RE | Disposition: A | Discharge status: Home / Self Care | Payer: Self-pay | Source: Ambulatory Visit | Attending: Gastroenterology

## 2017-10-28 DIAGNOSIS — K5730 Diverticulosis of large intestine without perforation or abscess without bleeding: Secondary | ICD-10-CM | POA: Diagnosis not present

## 2017-10-28 DIAGNOSIS — K5520 Angiodysplasia of colon without hemorrhage: Secondary | ICD-10-CM | POA: Diagnosis not present

## 2017-10-28 DIAGNOSIS — Z7982 Long term (current) use of aspirin: Secondary | ICD-10-CM | POA: Insufficient documentation

## 2017-10-28 DIAGNOSIS — E785 Hyperlipidemia, unspecified: Secondary | ICD-10-CM | POA: Insufficient documentation

## 2017-10-28 DIAGNOSIS — Z79899 Other long term (current) drug therapy: Secondary | ICD-10-CM | POA: Diagnosis not present

## 2017-10-28 DIAGNOSIS — Z86711 Personal history of pulmonary embolism: Secondary | ICD-10-CM | POA: Diagnosis not present

## 2017-10-28 DIAGNOSIS — G4733 Obstructive sleep apnea (adult) (pediatric): Secondary | ICD-10-CM | POA: Insufficient documentation

## 2017-10-28 DIAGNOSIS — I252 Old myocardial infarction: Secondary | ICD-10-CM | POA: Diagnosis not present

## 2017-10-28 DIAGNOSIS — K625 Hemorrhage of anus and rectum: Secondary | ICD-10-CM | POA: Insufficient documentation

## 2017-10-28 DIAGNOSIS — M1990 Unspecified osteoarthritis, unspecified site: Secondary | ICD-10-CM | POA: Diagnosis not present

## 2017-10-28 DIAGNOSIS — K219 Gastro-esophageal reflux disease without esophagitis: Secondary | ICD-10-CM | POA: Diagnosis not present

## 2017-10-28 DIAGNOSIS — Z888 Allergy status to other drugs, medicaments and biological substances status: Secondary | ICD-10-CM | POA: Insufficient documentation

## 2017-10-28 DIAGNOSIS — I69351 Hemiplegia and hemiparesis following cerebral infarction affecting right dominant side: Secondary | ICD-10-CM | POA: Diagnosis not present

## 2017-10-28 DIAGNOSIS — Z951 Presence of aortocoronary bypass graft: Secondary | ICD-10-CM | POA: Diagnosis not present

## 2017-10-28 DIAGNOSIS — Z885 Allergy status to narcotic agent status: Secondary | ICD-10-CM | POA: Diagnosis not present

## 2017-10-28 DIAGNOSIS — F419 Anxiety disorder, unspecified: Secondary | ICD-10-CM | POA: Insufficient documentation

## 2017-10-28 DIAGNOSIS — I110 Hypertensive heart disease with heart failure: Secondary | ICD-10-CM | POA: Diagnosis not present

## 2017-10-28 DIAGNOSIS — K635 Polyp of colon: Secondary | ICD-10-CM | POA: Insufficient documentation

## 2017-10-28 DIAGNOSIS — M797 Fibromyalgia: Secondary | ICD-10-CM | POA: Insufficient documentation

## 2017-10-28 DIAGNOSIS — I2510 Atherosclerotic heart disease of native coronary artery without angina pectoris: Secondary | ICD-10-CM | POA: Diagnosis not present

## 2017-10-28 DIAGNOSIS — Z87442 Personal history of urinary calculi: Secondary | ICD-10-CM | POA: Insufficient documentation

## 2017-10-28 DIAGNOSIS — F329 Major depressive disorder, single episode, unspecified: Secondary | ICD-10-CM | POA: Diagnosis not present

## 2017-10-28 DIAGNOSIS — I509 Heart failure, unspecified: Secondary | ICD-10-CM | POA: Insufficient documentation

## 2017-10-28 DIAGNOSIS — Z8719 Personal history of other diseases of the digestive system: Secondary | ICD-10-CM | POA: Insufficient documentation

## 2017-10-28 DIAGNOSIS — Z87891 Personal history of nicotine dependence: Secondary | ICD-10-CM | POA: Insufficient documentation

## 2017-10-28 HISTORY — PX: COLONOSCOPY WITH PROPOFOL: SHX5780

## 2017-10-28 SURGERY — COLONOSCOPY WITH PROPOFOL
Anesthesia: General

## 2017-10-28 MED ORDER — PROPOFOL 10 MG/ML IV BOLUS
INTRAVENOUS | Status: AC
  Filled 2017-10-28: qty 40

## 2017-10-28 MED ORDER — LIDOCAINE HCL (CARDIAC) PF 100 MG/5ML IV SOSY
PREFILLED_SYRINGE | INTRAVENOUS | Status: DC | PRN
  Administered 2017-10-28: 80 mg via INTRAVENOUS

## 2017-10-28 MED ORDER — PROPOFOL 10 MG/ML IV BOLUS
INTRAVENOUS | Status: DC | PRN
  Administered 2017-10-28: 60 mg via INTRAVENOUS
  Administered 2017-10-28: 40 mg via INTRAVENOUS

## 2017-10-28 MED ORDER — SODIUM CHLORIDE 0.9 % IV SOLN
INTRAVENOUS | Status: DC
  Administered 2017-10-28: 1000 mL via INTRAVENOUS

## 2017-10-28 MED ORDER — PROPOFOL 500 MG/50ML IV EMUL
INTRAVENOUS | Status: DC | PRN
  Administered 2017-10-28: 100 ug/kg/min via INTRAVENOUS

## 2017-10-28 NOTE — Op Note (Signed)
[facility] Gastroenterology Patient Name: Willie Clark Procedure Date: 10/28/2017 2:07 PM MRN: [id number] Account #: [id number] Date of Birth: 1950/08/23 Admit Type: Outpatient Age: 67 Room: [facility] ENDO ROOM 3 Gender: Male Note Status: Finalized Procedure:            Colonoscopy Indications:          Lower abdominal pain, Rectal bleeding Providers:            Lollie Sails, MD Referring MD:         Remus Blake MD, MD (Referring MD) Medicines:            Monitored Anesthesia Care Complications:        No immediate complications. Procedure:            Pre-Anesthesia Assessment:                       - ASA Grade Assessment: III - A patient with severe                        systemic disease.                       After obtaining informed consent, the colonoscope was                        passed under direct vision. Throughout the procedure,                        the patient's blood pressure, pulse, and oxygen                        saturations were monitored continuously. The                        Colonoscope was introduced through the anus and                        advanced to the the cecum, identified by appendiceal                        orifice and ileocecal valve. The colonoscopy was                        performed without difficulty. The patient tolerated the                        procedure well. The quality of the bowel preparation                        was good. Findings:      Multiple small-mouthed diverticula were found in the sigmoid colon,       descending colon, transverse colon and ascending colon.      A 2 mm polyp was found in the distal sigmoid colon. The polyp was       sessile. The polyp was removed with a cold biopsy forceps. Resection and       retrieval were complete.      The retroflexed view of the distal rectum and anal verge was normal and       showed no anal or rectal abnormalities.      A  few very small localized  angioectasias without bleeding were found in       the cecum no bleeding with rinsing, deep to mucosa. Impression:           - Diverticulosis in the sigmoid colon, in the                        descending colon, in the transverse colon and in the                        ascending colon.                       - One 2 mm polyp in the distal sigmoid colon, removed                        with a cold biopsy forceps. Resected and retrieved.                       - The distal rectum and anal verge are normal on                        retroflexion view.                       - A few non-bleeding colonic angioectasias. Recommendation:       - Use Citrucel one tablespoon PO daily daily.                       - Await pathology results.                       - Return to GI clinic in 1 month. Procedure Code(s):    --- Professional ---                       [id number], Colonoscopy, flexible; with biopsy, single or                        multiple Diagnosis Code(s):    --- Professional ---                       D12.5, Benign neoplasm of sigmoid colon                       K55.20, Angiodysplasia of colon without hemorrhage                       R10.30, Lower abdominal pain, unspecified                       K62.5, Hemorrhage of anus and rectum                       K57.30, Diverticulosis of large intestine without                        perforation or abscess without bleeding CPT copyright 2017 American Medical Association. All rights reserved. The codes documented in this report are preliminary and upon coder review may  be revised to meet current compliance requirements. Lollie Sails, MD 10/28/2017 2:44:22 PM This report has been signed  electronically. Number of Addenda: 0 Note Initiated On: 10/28/2017 2:07 PM Scope Withdrawal Time: 0 hours 7 minutes 35 seconds  Total Procedure Duration: 0 hours 22 minutes 44 seconds       [employer]

## 2017-10-28 NOTE — Anesthesia Preprocedure Evaluation (Addendum)
Anesthesia Evaluation  Patient identified by MRN, date of birth, ID band Patient awake    Reviewed: Allergy & Precautions, NPO status , Patient's Chart, lab work & pertinent test results, reviewed documented beta blocker date and time   Airway Mallampati: III  TM Distance: >3 FB     Dental  (+) Chipped   Pulmonary shortness of breath, sleep apnea and Continuous Positive Airway Pressure Ventilation , former smoker,           Cardiovascular hypertension, Pt. on medications + CAD, + Past MI, + CABG and +CHF       Neuro/Psych  Headaches, PSYCHIATRIC DISORDERS Anxiety Depression TIA Neuromuscular disease CVA, No Residual Symptoms    GI/Hepatic PUD, GERD  ,  Endo/Other    Renal/GU Renal disease     Musculoskeletal  (+) Arthritis , Fibromyalgia -  Abdominal   Peds  Hematology   Anesthesia Other Findings CABG in 1993 and redo in 2007. Stents in 2006.  Reproductive/Obstetrics                            Anesthesia Physical Anesthesia Plan  ASA: III  Anesthesia Plan: General   Post-op Pain Management:    Induction: Intravenous  PONV Risk Score and Plan:   Airway Management Planned:   Additional Equipment:   Intra-op Plan:   Post-operative Plan:   Informed Consent: I have reviewed the patients History and Physical, chart, labs and discussed the procedure including the risks, benefits and alternatives for the proposed anesthesia with the patient or authorized representative who has indicated his/her understanding and acceptance.     Plan Discussed with: CRNA  Anesthesia Plan Comments:         Anesthesia Quick Evaluation

## 2017-10-28 NOTE — H&P (Signed)
Outpatient short stay form Pre-procedure 10/28/2017 2:03 PM Willie Sails MD  Primary Physician: Kingsley Spittle MD  Reason for visit: Colonoscopy  History of present illness: Patient is a 67 year old male presenting today for colonoscopy.  He has been having problems for about 4 months with bilateral lower abdominal pain and some intermittent rectal bleeding.  His last colonoscopy was 12/13/2013 with 2 small polyps and some hemorrhoids noted there is also some diverticular pandiverticulosis.  The polyps were adenoma.  Patient tolerated his prep well.  He takes no aspirin or blood thinner with the exception of 81 mg aspirin that he has held.  Of note patient does have a history of bilateral inguinal hernia surgery.  Lower abdominal intermittent pain is mostly right lower quadrant.    Current Facility-Administered Medications:  .  0.9 %  sodium chloride infusion, , Intravenous, Continuous, Willie Sails, MD, Last Rate: 20 mL/hr at 10/28/17 1342  Medications Prior to Admission  Medication Sig Dispense Refill Last Dose  . acetaminophen (TYLENOL) 500 MG tablet Take 500 mg by mouth every 8 (eight) hours as needed for mild pain.    10/27/2017 at Unknown time  . aspirin EC 81 MG tablet Take 81 mg by mouth daily.   Past Week at Unknown time  . citalopram (CELEXA) 20 MG tablet Take 20 mg by mouth daily.    10/27/2017 at Unknown time  . docusate sodium (COLACE) 100 MG capsule Take 100 mg by mouth 2 (two) times daily.   10/27/2017 at Unknown time  . enalapril (VASOTEC) 20 MG tablet TAKE 1/2 TABLET  (10MG ) EVERY DAY 45 tablet 3 10/28/2017 at 600  . fexofenadine (ALLEGRA) 180 MG tablet Take 180 mg by mouth daily.   10/27/2017 at Unknown time  . FIBER PO Take 1 tablet by mouth daily.    10/27/2017 at Unknown time  . finasteride (PROSCAR) 5 MG tablet Take 5 mg by mouth daily.   10/27/2017 at Unknown time  . gabapentin (NEURONTIN) 800 MG tablet Take 800 mg by mouth 3 (three) times daily.   10/27/2017 at Unknown time   . meloxicam (MOBIC) 15 MG tablet Take 15 mg by mouth daily.   10/27/2017 at Unknown time  . methocarbamol (ROBAXIN) 500 MG tablet Take 500 mg by mouth 4 (four) times daily.   10/27/2017 at Unknown time  . Multiple Vitamins-Minerals (HAIR/SKIN/NAILS PO) Take 1 tablet by mouth 3 (three) times daily.    Past Week at Unknown time  . nitroGLYCERIN (NITROSTAT) 0.4 MG SL tablet Place 0.4 mg under the tongue every 5 (five) minutes as needed for chest pain.    Past Month at Unknown time  . nortriptyline (PAMELOR) 25 MG capsule Take 25 mg by mouth at bedtime.    10/27/2017 at Unknown time  . pantoprazole (PROTONIX) 40 MG tablet Take 40 mg by mouth daily.    10/27/2017 at Unknown time  . potassium chloride SA (K-DUR,KLOR-CON) 20 MEQ tablet Take 20 mEq by mouth daily.   10/27/2017 at Unknown time  . rosuvastatin (CRESTOR) 20 MG tablet Take 20 mg by mouth at bedtime.    10/27/2017 at Unknown time  . sildenafil (VIAGRA) 25 MG tablet Take 1 tablet (25 mg total) by mouth daily as needed for erectile dysfunction. 10 tablet 2 Past Month at Unknown time  . Simethicone (GAS-X PO) Take 1 tablet by mouth as needed (gas).    Past Week at Unknown time  . sucralfate (CARAFATE) 1 G tablet Take 1 g by mouth  4 (four) times daily.    10/27/2017 at Unknown time  . tamsulosin (FLOMAX) 0.4 MG CAPS capsule Take 0.4 mg by mouth daily.   Past Week at Unknown time  . topiramate (TOPAMAX) 50 MG tablet Take 50 mg by mouth 2 (two) times daily.   10/27/2017 at Unknown time  . vitamin B-12 (CYANOCOBALAMIN) 1000 MCG tablet Take 1,000 mcg by mouth daily.   10/27/2017 at Unknown time  . furosemide (LASIX) 40 MG tablet Take 1 tablet (40 mg total) by mouth daily as needed. 30 tablet 3 Taking     Allergies  Allergen Reactions  . Amitriptyline     Pt unsure of what happens  . Berniece Salines Flavor Diarrhea  . Cyclobenzaprine Other (See Comments)    Pt unsure what hapens  . Fish Oil Itching  . Lidocaine Nausea And Vomiting    Nasal Spray (only time had reaction)   . Other Diarrhea    Reactive agents: Onions, bacon, steak  . Percocet [Oxycodone-Acetaminophen] Other (See Comments)    Lowers BP     Past Medical History:  Diagnosis Date  . Anxiety   . Arthritis   . Barrett's esophagus   . Bowel obstruction ([place]) 02/2009   small bowel  . CHF (congestive heart failure) ([place])   . Chronic chest pain   . Coronary artery disease 1993   MI   . Depression   . Diverticulitis   . Diverticulosis   . Dyspnea    with exertion   . Fibromyalgia   . Gastric ulcer   . Gastritis   . GERD (gastroesophageal reflux disease)   . Headache   . Hemorrhoids   . History of kidney stones   . Hyperlipidemia   . Hypertension   . Kidney stones    going to [facility]  June 26 to see kidney specialist  . Lightheadedness   . Mass on back   . MI (myocardial infarction) ([place])   . OSA (obstructive sleep apnea)   . Pulmonary embolism ([place]) 01/2006   Post-op/treated  . Skin lesions, generalized   . Sleep apnea    C-PAP  . SOB (shortness of breath)   . Stroke [facility]) 1993   right side weakness no blood thinners   on Aspirin  . Syncope and collapse   . Urticaria     Review of systems:      Physical Exam    Heart and lungs: Regular rate and rhythm without rub or gallop, lungs are bilaterally clear.    HEENT: Normocephalic atraumatic eyes are anicteric    Other:    Pertinant exam for procedure: Soft nontender nondistended bowel sounds positive normoactive.  He has 2 well-healed herniorrhaphy scars 1 left lower one right lower quadrants.  These areas are nontender.  There does not appear to be a recurrent hernia.  Rebound.    Planned proceedures: Colonoscopy and indicated procedures. I have discussed the risks benefits and complications of procedures to include not limited to bleeding, infection, perforation and the risk of sedation and the patient wishes to proceed.    Willie Sails, MD Gastroenterology 10/28/2017  2:03 PM

## 2017-10-28 NOTE — Transfer of Care (Signed)
Immediate Anesthesia Transfer of Care Note  Patient: Willie Clark  Procedure(s) Performed: COLONOSCOPY WITH PROPOFOL (N/A )  Patient Location: PACU  Anesthesia Type:General  Level of Consciousness: awake, alert  and oriented  Airway & Oxygen Therapy: Patient Spontanous Breathing and Patient connected to nasal cannula oxygen  Post-op Assessment: Report given to RN and Post -op Vital signs reviewed and stable  Post vital signs: Reviewed and stable  Last Vitals:  Vitals Value Taken Time  BP    Temp    Pulse    Resp 17 10/28/2017  2:41 PM  SpO2    Vitals shown include unvalidated device data.  Last Pain:  Vitals:   10/28/17 1321  TempSrc: Tympanic  PainSc: 0-No pain      Patients Stated Pain Goal: 0 (82/41/75 3010)  Complications: No apparent anesthesia complications

## 2017-10-28 NOTE — Anesthesia Post-op Follow-up Note (Signed)
Anesthesia QCDR form completed.        

## 2017-10-28 NOTE — Anesthesia Postprocedure Evaluation (Signed)
Anesthesia Post Note  Patient: DAIVD FREDERICKSEN  Procedure(s) Performed: COLONOSCOPY WITH PROPOFOL (N/A )  Patient location during evaluation: Endoscopy Anesthesia Type: General Level of consciousness: awake and alert Pain management: pain level controlled Vital Signs Assessment: post-procedure vital signs reviewed and stable Respiratory status: spontaneous breathing, nonlabored ventilation, respiratory function stable and patient connected to nasal cannula oxygen Cardiovascular status: blood pressure returned to baseline and stable Postop Assessment: no apparent nausea or vomiting Anesthetic complications: no     Last Vitals:  Vitals:   10/28/17 1321 10/28/17 1441  BP: (!) 156/88 110/69  Pulse: (!) 56 (!) 55  Resp: 18 16  Temp: (!) 35.6 C (!) 36.1 C  SpO2: 100% 100%    Last Pain:  Vitals:   10/28/17 1501  TempSrc:   PainSc: 0-No pain                 Hever Castilleja S

## 2017-10-29 ENCOUNTER — Encounter: Payer: Self-pay | Admitting: Gastroenterology

## 2017-10-30 LAB — SURGICAL PATHOLOGY

## 2017-12-16 ENCOUNTER — Ambulatory Visit: Payer: Medicare HMO | Admitting: Nurse Practitioner

## 2017-12-16 ENCOUNTER — Encounter: Payer: Self-pay | Admitting: Nurse Practitioner

## 2017-12-16 VITALS — BP 116/70 | HR 71 | Ht 69.0 in | Wt 193.5 lb

## 2017-12-16 DIAGNOSIS — E785 Hyperlipidemia, unspecified: Secondary | ICD-10-CM | POA: Diagnosis not present

## 2017-12-16 DIAGNOSIS — I25119 Atherosclerotic heart disease of native coronary artery with unspecified angina pectoris: Secondary | ICD-10-CM | POA: Diagnosis not present

## 2017-12-16 DIAGNOSIS — I951 Orthostatic hypotension: Secondary | ICD-10-CM | POA: Diagnosis not present

## 2017-12-16 DIAGNOSIS — I10 Essential (primary) hypertension: Secondary | ICD-10-CM

## 2017-12-16 NOTE — Progress Notes (Signed)
Office Visit    Patient Name: Willie Clark Date of Encounter: 12/16/2017  Primary Care Provider:  [name], MD Primary Cardiologist:  [name], MD  Chief Complaint    67 year old male with a history of CAD status post CABG and redo CABG, HFpEF, chronic chest pain, fibromyalgia, GERD, gastritis, peptic ulcer disease, hypertension, hyperlipidemia, nephrolithiasis, orthostatic hypotension with chronic dizziness, sleep apnea, stroke, and prior postoperative PE, who presents for follow-up.  Past Medical History    Past Medical History:  Diagnosis Date  . (HFpEF) heart failure with preserved ejection fraction ([place])    a. 09/2016 Echo: EF 55-60%, no rwma, triv AI, mild MR, mildly dil LA.  [name] Anxiety   . Arthritis   . Barrett's esophagus   . Bowel obstruction ([place]) 02/2009   small bowel  . Chronic chest pain   . Coronary artery disease    a. 1993 CABG; b. 2007 Redo CABG; c. 09/2015 Cath: LM mild dzs, LAD 100ost, RI mild dzs, LCX 60p, OM1 100, RCA 100ost, VG->OM2 min irregs, LIMA->LAD nl, VG->RPDA 30ost, 20d, nl EF->Med rx.  . Depression   . Diverticulitis   . Diverticulosis   . Erectile dysfunction   . Fibromyalgia   . Gastric ulcer   . Gastritis   . GERD (gastroesophageal reflux disease)   . Headache   . Hemorrhoids   . Hyperlipidemia   . Hypertension   . Kidney stones    going to [facility]  June 26 to see kidney specialist  . Lightheadedness   . Mass on back   . MI (myocardial infarction) ([place])   . Nephrolithiasis   . Orthostatic hypotension    a. Improved after discontinuation of metoprolol.  . OSA (obstructive sleep apnea)    a. On CPAP.  [name] Pulmonary embolism ([place]) 01/2006   Post-op/treated  . Skin lesions, generalized   . Stroke [facility]) 1993   right side weakness no blood thinners   on Aspirin  . Syncope and collapse   . Urticaria    Past Surgical History:  Procedure Laterality Date  . BACK SURGERY    . bowel obstruction  01/2009  . CARDIAC  CATHETERIZATION  11/2006   patent grafts. Significant OM3 disease and occluded diagonals.  [name] CARDIAC CATHETERIZATION  06/2010   patent grafts. significant ISR in proximal LCX but OM2 is bypassed and gives retrograde flow to OM3.   [name] CARDIAC CATHETERIZATION  06/2013   [facility]: Patent grafts with 60% proximal in-stent restenosis in the left circumflex with FFR of 0.85  . CARDIAC CATHETERIZATION Left 09/25/2015   Procedure: Left Heart Cath and Cors/Grafts Angiography;  Surgeon: Wellington Hampshire, MD;  Location: [facility];  Service: Cardiovascular;  Laterality: Left;  . CERVICAL FUSION    . CHOLECYSTECTOMY    . COLON SURGERY  01/2009   BOWEL RESECTION DUE TO SMALL BOWEL OBSTRUCTION  . COLONOSCOPY    . COLONOSCOPY WITH PROPOFOL N/A 10/28/2017   Procedure: COLONOSCOPY WITH PROPOFOL;  Surgeon: Lollie Sails, MD;  Location: [facility] ENDOSCOPY;  Service: Endoscopy;  Laterality: N/A;  . CORONARY ANGIOPLASTY  2006  . CORONARY ARTERY BYPASS GRAFT  1993/01/2006   redo at [facility]. LIMA to LAD, SVG to OM2 and SVG to RPDA  . CYSTOSCOPY WITH STENT PLACEMENT Bilateral 09/24/2015   Procedure: CYSTOSCOPY WITH BILATERAL RETROGRADES, BILATERAL STENT PLACEMENT;  Surgeon: Festus Aloe, MD;  Location: [facility] ORS;  Service: Urology;  Laterality: Bilateral;  . ESOPHAGOGASTRODUODENOSCOPY N/A 10/25/2014  Procedure: ESOPHAGOGASTRODUODENOSCOPY (EGD);  Surgeon: Lollie Sails, MD;  Location: [facility] ENDOSCOPY;  Service: Endoscopy;  Laterality: N/A;  . ESOPHAGOGASTRODUODENOSCOPY (EGD) WITH PROPOFOL N/A 02/03/2017   Procedure: ESOPHAGOGASTRODUODENOSCOPY (EGD) WITH PROPOFOL;  Surgeon: Lollie Sails, MD;  Location: [facility] ENDOSCOPY;  Service: Endoscopy;  Laterality: N/A;  . ESOPHAGOGASTRODUODENOSCOPY (EGD) WITH PROPOFOL N/A 04/18/2017   Procedure: ESOPHAGOGASTRODUODENOSCOPY (EGD) WITH PROPOFOL;  Surgeon: Lollie Sails, MD;  Location: [facility] ENDOSCOPY;  Service: Endoscopy;  Laterality: N/A;  . EYE SURGERY Bilateral     Cataract Extraction with IOL  . HERNIA REPAIR    . INGUINAL HERNIA REPAIR Bilateral 01/23/2016   Procedure: HERNIA REPAIR INGUINAL ADULT BILATERAL;  Surgeon: Leonie Green, MD;  Location: [facility] ORS;  Service: General;  Laterality: Bilateral;  . LUMBAR LAMINECTOMY/DECOMPRESSION MICRODISCECTOMY Left 10/01/2016   Procedure: Left Lumbar four-five Laminotomy for resection of synovial cyst;  Surgeon: Erline Levine, MD;  Location: [place] [facility];  Service: Neurosurgery;  Laterality: Left;  left  . NECK SURGERY  06/2009  . SHOULDER SURGERY  2010    Allergies  Allergies  Allergen Reactions  . Amitriptyline     Pt unsure of what happens  . Berniece Salines Flavor Diarrhea  . Cyclobenzaprine Other (See Comments)    Pt unsure what hapens  . Fish Oil Itching  . Lidocaine Nausea And Vomiting    Nasal Spray (only time had reaction)  . Other Diarrhea    Reactive agents: Onions, bacon, steak  . Percocet [Oxycodone-Acetaminophen] Other (See Comments)    Lowers BP    History of Present Illness    67 year old male with the above complex past medical history including coronary artery disease status post CABG in 1993 with redo CABG in 2007.  His last catheterization in 2017 revealed 3 of 3 patent grafts and he has been medically managed since.  Other history includes fibromyalgia with chronic chest pain, orthostatic hypotension with dizziness, hypertension, hyperlipidemia, sleep apnea, erectile dysfunction, prior stroke, prior PE, GERD, gastritis, peptic ulcer disease, nephrolithiasis, and depression.  He was last seen in clinic in May, at which time he was symptomatically stable.  Since his last visit, he has continued to have intermittent chest discomfort.  He says that if he takes a deep enough breath, he will have chest discomfort every day, which is not anything new for him.  He has intermittent rest and exertional chest tightness that may move into his throat, which again, is fairly common for him dating back some time  now.  A few weeks ago, he had a severe episode of left-sided chest discomfort rating down his left arm.  He said it hurt so bad that he could barely move his left arm and in fact, moving his left arm made it worse.  He says he thought about going to the emergency room but symptoms subsequently resolved.  Since then, he has had a few brief episodes of chest discomfort, more similar to what is usual angina might be like.  It does not typically limit activities and is not typically associated with any symptoms.  He has also had issues with intermittent low blood pressures, sometimes dropping below 90 systolic, and associated with lightheadedness.  In that setting, his PCP reduced his enalapril dose to 5 mg daily.  Since doing this a few weeks ago, he has had no recurrence of low blood pressures or lightheadedness.  He denies palpitations, PND, orthopnea, syncope, or early satiety.  He does sometimes have dependent/ankle edema at the end of the day.  He has chronic low back and also neck pain.  Home Medications    Prior to Admission medications   Medication Sig Start Date End Date Taking? Authorizing Provider  acetaminophen (TYLENOL) 500 MG tablet Take 500 mg by mouth every 8 (eight) hours as needed for mild pain.  06/09/12   [provider]  aspirin EC 81 MG tablet Take 81 mg by mouth daily.    [provider]  citalopram (CELEXA) 20 MG tablet Take 20 mg by mouth daily.     [provider]  docusate sodium (COLACE) 100 MG capsule Take 100 mg by mouth 2 (two) times daily.    [provider]  enalapril (VASOTEC) 20 MG tablet TAKE 1/2 TABLET  (10MG ) EVERY DAY 03/05/16   Wellington Hampshire, MD  fexofenadine (ALLEGRA) 180 MG tablet Take 180 mg by mouth daily.    [provider]  FIBER PO Take 1 tablet by mouth daily.     [provider]  finasteride (PROSCAR) 5 MG tablet Take 5 mg by mouth daily.    [provider]  furosemide (LASIX) 40 MG  tablet Take 1 tablet (40 mg total) by mouth daily as needed. 11/25/11   Wellington Hampshire, MD  gabapentin (NEURONTIN) 800 MG tablet Take 800 mg by mouth 3 (three) times daily.    [provider]  meloxicam (MOBIC) 15 MG tablet Take 15 mg by mouth daily.    [provider]  methocarbamol (ROBAXIN) 500 MG tablet Take 500 mg by mouth 4 (four) times daily.    [provider]  Multiple Vitamins-Minerals (HAIR/SKIN/NAILS PO) Take 1 tablet by mouth 3 (three) times daily.     [provider]  nitroGLYCERIN (NITROSTAT) 0.4 MG SL tablet Place 0.4 mg under the tongue every 5 (five) minutes as needed for chest pain.     [provider]  nortriptyline (PAMELOR) 25 MG capsule Take 25 mg by mouth at bedtime.  07/25/14   [provider]  pantoprazole (PROTONIX) 40 MG tablet Take 40 mg by mouth daily.     [provider]  potassium chloride SA (K-DUR,KLOR-CON) 20 MEQ tablet Take 20 mEq by mouth daily.    [provider]  rosuvastatin (CRESTOR) 20 MG tablet Take 20 mg by mouth at bedtime.     [provider]  sildenafil (VIAGRA) 25 MG tablet Take 1 tablet (25 mg total) by mouth daily as needed for erectile dysfunction. 08/26/17   Wellington Hampshire, MD  Simethicone (GAS-X PO) Take 1 tablet by mouth as needed (gas).     [provider]  sucralfate (CARAFATE) 1 G tablet Take 1 g by mouth 4 (four) times daily.  06/09/12   [provider]  tamsulosin (FLOMAX) 0.4 MG CAPS capsule Take 0.4 mg by mouth daily.    [provider]  topiramate (TOPAMAX) 50 MG tablet Take 50 mg by mouth 2 (two) times daily.    [provider]  vitamin B-12 (CYANOCOBALAMIN) 1000 MCG tablet Take 1,000 mcg by mouth daily.    [provider]    Review of Systems    Episodic chest discomfort as outlined above with a more severe episode a few weeks ago.  Chronic low back and neck pain.  Occasional dependent edema.  Lightheadedness  associate with low blood pressures, now improved.  He denies palpitations, PND, orthopnea, syncope, or early satiety.  All other systems reviewed and are otherwise negative except as noted above.  Physical Exam  VS:  BP 116/70 (BP Location: Left Arm, Patient Position: Sitting, Cuff Size: Normal)   Pulse 71   Ht 5\' 9"  (1.753 m)   Wt 193 lb 8 oz (87.8 kg)   BMI 28.57 kg/m  , BMI Body mass index is 28.57 kg/m. GEN: Well nourished, well developed, in no acute distress.  HEENT: normal.  Neck: Supple, no JVD, carotid bruits, or masses. Cardiac: RRR, no murmurs, rubs, or gallops. No clubbing, cyanosis, edema.  Radials/DP/PT 2+ and equal bilaterally.  Respiratory:  Respirations regular and unlabored, clear to auscultation bilaterally. GI: Soft, nontender, nondistended, BS + x 4. MS: no deformity or atrophy. Skin: warm and dry, no rash. Neuro:  Strength and sensation are intact. Psych: Normal affect.  Accessory Clinical Findings    ECG -regular sinus rhythm, 71, PVC, nonspecific T flattening.  Assessment & Plan    1.  Coronary artery disease with unspecified angina: Patient with history of CAD status post CABG in 1993 with redo CABG in 2007.  Last catheterization in 2017 showed 3 of 3 patent grafts with otherwise stable anatomy.  He has some degree of chronic chest discomfort that is often pleuritic in nature but at other times is more tight and radiates to his neck.  He had an episode of worsening left-sided chest discomfort with left arm radiation a few weeks ago.  This particular episode stood out as different from prior episodes.  He has had no recurrence quite as severe but has continued to have intermittent mild chest discomfort, similar to what he is experienced over the past few years.  In the setting of worsening symptoms a few weeks ago, I will arrange for a Lexiscan Myoview.  He remains on aspirin and statin therapy.  With a history of soft blood pressures and intermittent  lightheadedness/orthostasis, he would not likely tolerate a long-acting nitrate.  2.  Essential hypertension/orthostatic hypotension: He has been having intermittent lightheadedness associated with low blood pressures.  Enalapril dose reduced to 5 mg daily and since then, symptoms have improved.  Blood pressure stable today.  He is aware that if he has any recurrent symptoms, he may hold his enalapril.  3.  Hyperlipidemia: Lipids followed by primary care.  He remains on statin therapy.  He had normal LFTs in June.  4.  Erectile dysfunction: Stable on sildenafil.  He is aware that he may not take nitroglycerin after using sildenafil.  5.  Mild carotid arterial disease: Previous Doppler showed less than 40% stenosis.  No repeat needed.  6.  Chronic venous insufficiency: Switch from Lasix to indapamide by urology at [facility] in the setting of nephrolithiasis.  Stable.  7.  Disposition: Follow-up stress testing as above.  Follow-up in clinic in 2 to 3 months or sooner if necessary.   Murray Hodgkins, NP 12/16/2017, 9:48 AM

## 2017-12-16 NOTE — Patient Instructions (Addendum)
Medication Instructions:  Your physician recommends that you continue on your current medications as directed. Please refer to the Current Medication list given to you today.   Labwork: none  Testing/Procedures: Your physician has requested that you have a lexiscan myoview. For further information please visit [email or web address]. Please follow instruction sheet, as given.  [place]  Your caregiver has ordered a Stress Test with nuclear imaging. The purpose of this test is to evaluate the blood supply to your heart muscle. This procedure is referred to as a "Non-Invasive Stress Test." This is because other than having an IV started in your vein, nothing is inserted or "invades" your body. Cardiac stress tests are done to find areas of poor blood flow to the heart by determining the extent of coronary artery disease (CAD). Some patients exercise on a treadmill, which naturally increases the blood flow to your heart, while others who are  unable to walk on a treadmill due to physical limitations have a pharmacologic/chemical stress agent called Lexiscan . This medicine will mimic walking on a treadmill by temporarily increasing your coronary blood flow.   Please note: these test may take anywhere between 2-4 hours to complete  PLEASE REPORT TO [facility] AT THE FIRST DESK WILL DIRECT YOU WHERE TO GO  Date of Procedure:_____________________________________  Arrival Time for Procedure:______________________________    PLEASE NOTIFY THE OFFICE AT LEAST 24 HOURS IN ADVANCE IF YOU ARE UNABLE TO KEEP YOUR APPOINTMENT.  [phone] AND  PLEASE NOTIFY NUCLEAR MEDICINE AT [facility] AT LEAST 24 HOURS IN ADVANCE IF YOU ARE UNABLE TO KEEP YOUR APPOINTMENT. [phone]  How to prepare for your Myoview test:  1. Do not eat or drink after midnight 2. No caffeine for 24 hours prior to test 3. No smoking 24 hours prior to test. 4. Your medication may be taken with  water.  If your doctor stopped a medication because of this test, do not take that medication. 5. Ladies, please do not wear dresses.  Skirts or pants are appropriate. Please wear a short sleeve shirt. 6. No perfume, cologne or lotion. 7. Wear comfortable walking shoes. No heels!   Follow-Up: Your physician recommends that you schedule a follow-up appointment in: 2-3 Wall.   If you need a refill on your cardiac medications before your next appointment, please call your pharmacy.    Cardiac Nuclear Scan A cardiac nuclear scan is a test that measures blood flow to the heart when a person is resting and when he or she is exercising. The test looks for problems such as:  Not enough blood reaching a portion of the heart.  The heart muscle not working normally.  You may need this test if:  You have heart disease.  You have had abnormal lab results.  You have had heart surgery or angioplasty.  You have chest pain.  You have shortness of breath.  In this test, a radioactive dye (tracer) is injected into your bloodstream. After the tracer has traveled to your heart, an imaging device is used to measure how much of the tracer is absorbed by or distributed to various areas of your heart. This procedure is usually done at a hospital and takes 2-4 hours. Tell a health care provider about:  Any allergies you have.  All medicines you are taking, including vitamins, herbs, eye drops, creams, and over-the-counter medicines.  Any problems you or family members have had with the use of anesthetic medicines.  Any  blood disorders you have.  Any surgeries you have had.  Any medical conditions you have.  Whether you are pregnant or may be pregnant. What are the risks? Generally, this is a safe procedure. However, problems may occur, including:  Serious chest pain and heart attack. This is only a risk if the stress portion of the test is done.  Rapid  heartbeat.  Sensation of warmth in your chest. This usually passes quickly.  What happens before the procedure?  Ask your health care provider about changing or stopping your regular medicines. This is especially important if you are taking diabetes medicines or blood thinners.  Remove your jewelry on the day of the procedure. What happens during the procedure?  An IV tube will be inserted into one of your veins.  Your health care provider will inject a small amount of radioactive tracer through the tube.  You will wait for 20-40 minutes while the tracer travels through your bloodstream.  Your heart activity will be monitored with an electrocardiogram (ECG).  You will lie down on an exam table.  Images of your heart will be taken for about 15-20 minutes.  You may be asked to exercise on a treadmill or stationary bike. While you exercise, your heart's activity will be monitored with an ECG, and your blood pressure will be checked. If you are unable to exercise, you may be given a medicine to increase blood flow to parts of your heart.  When blood flow to your heart has peaked, a tracer will again be injected through the IV tube.  After 20-40 minutes, you will get back on the exam table and have more images taken of your heart.  When the procedure is over, your IV tube will be removed. The procedure may vary among health care providers and hospitals. Depending on the type of tracer used, scans may need to be repeated 3-4 hours later. What happens after the procedure?  Unless your health care provider tells you otherwise, you may return to your normal schedule, including diet, activities, and medicines.  Unless your health care provider tells you otherwise, you may increase your fluid intake. This will help flush the contrast dye from your body. Drink enough fluid to keep your urine clear or pale yellow.  It is up to you to get your test results. Ask your health care provider, or  the department that is doing the test, when your results will be ready. Summary  A cardiac nuclear scan measures the blood flow to the heart when a person is resting and when he or she is exercising.  You may need this test if you are at risk for heart disease.  Tell your health care provider if you are pregnant.  Unless your health care provider tells you otherwise, increase your fluid intake. This will help flush the contrast dye from your body. Drink enough fluid to keep your urine clear or pale yellow. This information is not intended to replace advice given to you by your health care provider. Make sure you discuss any questions you have with your health care provider. Document Released: 05/03/2004 Document Revised: 04/10/2016 Document Reviewed: 03/17/2013 Elsevier Interactive Patient Education  2017 [employer].

## 2017-12-19 ENCOUNTER — Encounter
Admission: RE | Admit: 2017-12-19 | Discharge: 2017-12-19 | Disposition: A | Discharge status: Home / Self Care | Payer: Medicare HMO | Source: Ambulatory Visit | Attending: Nurse Practitioner | Admitting: Nurse Practitioner

## 2017-12-19 DIAGNOSIS — I10 Essential (primary) hypertension: Secondary | ICD-10-CM | POA: Insufficient documentation

## 2017-12-19 DIAGNOSIS — I25119 Atherosclerotic heart disease of native coronary artery with unspecified angina pectoris: Secondary | ICD-10-CM | POA: Diagnosis present

## 2017-12-19 MED ORDER — REGADENOSON 0.4 MG/5ML IV SOLN
0.4000 mg | Freq: Once | INTRAVENOUS | Status: AC
Start: 2017-12-19 — End: 2017-12-19
  Administered 2017-12-19: 0.4 mg via INTRAVENOUS
  Filled 2017-12-19: qty 5

## 2017-12-19 MED ORDER — TECHNETIUM TC 99M TETROFOSMIN IV KIT
13.9200 | PACK | Freq: Once | INTRAVENOUS | Status: AC | PRN
  Administered 2017-12-19: 13.92 via INTRAVENOUS

## 2017-12-19 MED ORDER — TECHNETIUM TC 99M TETROFOSMIN IV KIT
31.6430 | PACK | Freq: Once | INTRAVENOUS | Status: AC | PRN
  Administered 2017-12-19: 31.643 via INTRAVENOUS

## 2017-12-20 LAB — NM MYOCAR MULTI W/SPECT W/WALL MOTION / EF
CSEPEDS: 0 s
CSEPHR: 61 %
Estimated workload: 1 METS
Exercise duration (min): 0 min
LVDIAVOL: 116 mL (ref 62–150)
LVSYSVOL: 43 mL
MPHR: 154 {beats}/min
Peak HR: 95 {beats}/min
Rest HR: 58 {beats}/min
SDS: 5
SRS: 5
SSS: 4
TID: 0.83

## 2017-12-24 DIAGNOSIS — Z951 Presence of aortocoronary bypass graft: Secondary | ICD-10-CM | POA: Insufficient documentation

## 2018-01-09 ENCOUNTER — Telehealth: Payer: Self-pay | Admitting: Cardiovascular Disease

## 2018-01-09 NOTE — Telephone Encounter (Signed)
Patient had a myoview on 12/19/17. To Ignacia Bayley, NP for any further recommendations.

## 2018-01-09 NOTE — Telephone Encounter (Signed)
New message:       Louie Casa PA at [facility] is calling to see if we have any other test for the pt to have. He states the pt is currently in their care and they would like to run some additional test on the pt.

## 2018-01-12 NOTE — Telephone Encounter (Signed)
Recent stable stress test (compared to priors).  He has a h/o atypical chest pain.  We were not planning on pursuing additional eval.

## 2018-01-12 NOTE — Telephone Encounter (Signed)
Spoke with Vincente Liberty Charge nurse and reviewed test results and no need for additional testing at this time. She reports Mr. Ailene Ravel is on vacation but that they just wanted to make sure patient was optimized for surgery. Reviewed providers recommendations with no further questions at this time.

## 2018-02-16 ENCOUNTER — Other Ambulatory Visit: Payer: Self-pay | Admitting: Gastroenterology

## 2018-02-16 DIAGNOSIS — R1084 Generalized abdominal pain: Secondary | ICD-10-CM

## 2018-02-19 ENCOUNTER — Ambulatory Visit
Admission: RE | Admit: 2018-02-19 | Discharge: 2018-02-19 | Disposition: A | Discharge status: Home / Self Care | Payer: Medicare HMO | Source: Ambulatory Visit | Attending: Gastroenterology | Admitting: Gastroenterology

## 2018-02-19 DIAGNOSIS — K449 Diaphragmatic hernia without obstruction or gangrene: Secondary | ICD-10-CM | POA: Diagnosis not present

## 2018-02-19 DIAGNOSIS — K760 Fatty (change of) liver, not elsewhere classified: Secondary | ICD-10-CM | POA: Diagnosis not present

## 2018-02-19 DIAGNOSIS — K5730 Diverticulosis of large intestine without perforation or abscess without bleeding: Secondary | ICD-10-CM | POA: Diagnosis not present

## 2018-02-19 DIAGNOSIS — R1084 Generalized abdominal pain: Secondary | ICD-10-CM | POA: Insufficient documentation

## 2018-02-19 DIAGNOSIS — K257 Chronic gastric ulcer without hemorrhage or perforation: Secondary | ICD-10-CM | POA: Insufficient documentation

## 2018-02-19 MED ORDER — IOPAMIDOL (ISOVUE-300) INJECTION 61%
100.0000 mL | Freq: Once | INTRAVENOUS | Status: AC | PRN
  Administered 2018-02-19: 100 mL via INTRAVENOUS

## 2018-02-24 ENCOUNTER — Encounter: Payer: Self-pay | Admitting: Cardiovascular Disease

## 2018-02-24 ENCOUNTER — Ambulatory Visit: Payer: Medicare HMO | Admitting: Cardiovascular Disease

## 2018-02-24 VITALS — BP 106/70 | HR 63 | Ht 69.0 in | Wt 198.0 lb

## 2018-02-24 DIAGNOSIS — I25119 Atherosclerotic heart disease of native coronary artery with unspecified angina pectoris: Secondary | ICD-10-CM | POA: Diagnosis not present

## 2018-02-24 DIAGNOSIS — R109 Unspecified abdominal pain: Secondary | ICD-10-CM

## 2018-02-24 DIAGNOSIS — I10 Essential (primary) hypertension: Secondary | ICD-10-CM

## 2018-02-24 DIAGNOSIS — E785 Hyperlipidemia, unspecified: Secondary | ICD-10-CM

## 2018-02-24 DIAGNOSIS — I779 Disorder of arteries and arterioles, unspecified: Secondary | ICD-10-CM

## 2018-02-24 DIAGNOSIS — I739 Peripheral vascular disease, unspecified: Secondary | ICD-10-CM

## 2018-02-24 DIAGNOSIS — I872 Venous insufficiency (chronic) (peripheral): Secondary | ICD-10-CM

## 2018-02-24 NOTE — Patient Instructions (Signed)
Medication Instructions:  No changes If you need a refill on your cardiac medications before your next appointment, please call your pharmacy.   Lab work: None ordered  Testing/Procedures: Dr. Fletcher Anon has ordered a Mesenteric Ultrasound. This will take placed at the [facility].  Follow-Up: At [name], you and your health needs are our priority.  As part of our continuing mission to provide you with exceptional heart care, we have created designated Provider Care Teams.  These Care Teams include your primary Cardiologist (physician) and Advanced Practice Providers (APPs -  Physician Assistants and Nurse Practitioners) who all work together to provide you with the care you need, when you need it. You will need a follow up appointment in 6 months.  Please call our office 2 months in advance to schedule this appointment.  You may see [name], MD or one of the following Advanced Practice Providers on your designated Care Team:   Murray Hodgkins, NP Christell Faith, PA-C . Marrianne Mood, PA-C

## 2018-02-24 NOTE — Progress Notes (Signed)
Cardiology Office Note   Date:  02/24/2018   ID:  Willie Clark, DOB August 04, 1950, MRN [id number]  PCP:  [name], MD  Cardiologist:   [name], MD   Chief Complaint  Patient presents with  . OTHER    F/u myoview c/o low BP . Meds reviewed verbally with pt.      History of Present Illness: Willie Clark is a 67 y.o. male who presents for  a followup visit. He has known history of coronary artery disease. He is status post coronary artery bypass graft surgery in 1993 and redo in 2007.   He suffers from fibromyalgia and chronic pain.  He had GI symptoms in 2015 due to documented gastric ulcer. He had neck surgery in 0300 without complications.   Cardiac catheterization in June, 2017 showed significant underlying three-vessel disease with patent grafts and no change in coronary anatomy since most recent cardiac catheterization. Ejection fraction was 50-55% with normal left ventricular end-diastolic pressure.  He has chronic dizziness due to orthostatic hypotension that has improved after stopping metoprolol.    He was seen by Ignacia Bayley few months ago with atypical chest pain.  He underwent a Lexiscan Myoview which showed mild ischemia in the distal anterior wall.  The test was not different from his prior Myoview.  He was treated medically.  He reports stable symptoms overall.  He does have chronic chest pain that responds to rest most of the time.  He has not had to use nitroglycerin in a while.  He is dealing with postprandial abdominal pain with negative GI work-up.  No recent weight loss.  Past Medical History:  Diagnosis Date  . (HFpEF) heart failure with preserved ejection fraction ([place])    a. 09/2016 Echo: EF 55-60%, no rwma, triv AI, mild MR, mildly dil LA.  [name] Anxiety   . Arthritis   . Barrett's esophagus   . Bowel obstruction ([place]) 02/2009   small bowel  . Chronic chest pain   . Coronary artery disease    a. 1993 CABG; b. 2007 Redo CABG; c. 09/2015 Cath:  LM mild dzs, LAD 100ost, RI mild dzs, LCX 60p, OM1 100, RCA 100ost, VG->OM2 min irregs, LIMA->LAD nl, VG->RPDA 30ost, 20d, nl EF->Med rx.  . Depression   . Diverticulitis   . Diverticulosis   . Erectile dysfunction   . Fibromyalgia   . Gastric ulcer   . Gastritis   . GERD (gastroesophageal reflux disease)   . Headache   . Hemorrhoids   . Hyperlipidemia   . Hypertension   . Kidney stones    going to [facility]  June 26 to see kidney specialist  . Lightheadedness   . Mass on back   . MI (myocardial infarction) ([place])   . Nephrolithiasis   . Orthostatic hypotension    a. Improved after discontinuation of metoprolol.  . OSA (obstructive sleep apnea)    a. On CPAP.  [name] Pulmonary embolism ([place]) 01/2006   Post-op/treated  . Skin lesions, generalized   . Stroke [facility]) 1993   right side weakness no blood thinners   on Aspirin  . Syncope and collapse   . Urticaria     Past Surgical History:  Procedure Laterality Date  . BACK SURGERY    . bowel obstruction  01/2009  . CARDIAC CATHETERIZATION  11/2006   patent grafts. Significant OM3 disease and occluded diagonals.  [name] CARDIAC CATHETERIZATION  06/2010   patent grafts. significant ISR in proximal  LCX but OM2 is bypassed and gives retrograde flow to OM3.   [name] CARDIAC CATHETERIZATION  06/2013   [facility]: Patent grafts with 60% proximal in-stent restenosis in the left circumflex with FFR of 0.85  . CARDIAC CATHETERIZATION Left 09/25/2015   Procedure: Left Heart Cath and Cors/Grafts Angiography;  Surgeon: Wellington Hampshire, MD;  Location: [facility];  Service: Cardiovascular;  Laterality: Left;  . CERVICAL FUSION    . CHOLECYSTECTOMY    . COLON SURGERY  01/2009   BOWEL RESECTION DUE TO SMALL BOWEL OBSTRUCTION  . COLONOSCOPY    . COLONOSCOPY WITH PROPOFOL N/A 10/28/2017   Procedure: COLONOSCOPY WITH PROPOFOL;  Surgeon: Lollie Sails, MD;  Location: [facility] ENDOSCOPY;  Service: Endoscopy;  Laterality: N/A;  . CORONARY ANGIOPLASTY  2006    . CORONARY ARTERY BYPASS GRAFT  1993/01/2006   redo at [facility]. LIMA to LAD, SVG to OM2 and SVG to RPDA  . CYSTOSCOPY WITH STENT PLACEMENT Bilateral 09/24/2015   Procedure: CYSTOSCOPY WITH BILATERAL RETROGRADES, BILATERAL STENT PLACEMENT;  Surgeon: Festus Aloe, MD;  Location: [facility] ORS;  Service: Urology;  Laterality: Bilateral;  . ESOPHAGOGASTRODUODENOSCOPY N/A 10/25/2014   Procedure: ESOPHAGOGASTRODUODENOSCOPY (EGD);  Surgeon: Lollie Sails, MD;  Location: [facility] ENDOSCOPY;  Service: Endoscopy;  Laterality: N/A;  . ESOPHAGOGASTRODUODENOSCOPY (EGD) WITH PROPOFOL N/A 02/03/2017   Procedure: ESOPHAGOGASTRODUODENOSCOPY (EGD) WITH PROPOFOL;  Surgeon: Lollie Sails, MD;  Location: [facility] ENDOSCOPY;  Service: Endoscopy;  Laterality: N/A;  . ESOPHAGOGASTRODUODENOSCOPY (EGD) WITH PROPOFOL N/A 04/18/2017   Procedure: ESOPHAGOGASTRODUODENOSCOPY (EGD) WITH PROPOFOL;  Surgeon: Lollie Sails, MD;  Location: [facility] ENDOSCOPY;  Service: Endoscopy;  Laterality: N/A;  . EYE SURGERY Bilateral    Cataract Extraction with IOL  . HERNIA REPAIR    . INGUINAL HERNIA REPAIR Bilateral 01/23/2016   Procedure: HERNIA REPAIR INGUINAL ADULT BILATERAL;  Surgeon: Leonie Green, MD;  Location: [facility] ORS;  Service: General;  Laterality: Bilateral;  . LUMBAR LAMINECTOMY/DECOMPRESSION MICRODISCECTOMY Left 10/01/2016   Procedure: Left Lumbar four-five Laminotomy for resection of synovial cyst;  Surgeon: Erline Levine, MD;  Location: [facility];  Service: Neurosurgery;  Laterality: Left;  left  . NECK SURGERY  06/2009  . SHOULDER SURGERY  2010     Current Outpatient Medications  Medication Sig Dispense Refill  . acetaminophen (TYLENOL) 500 MG tablet Take 500 mg by mouth every 8 (eight) hours as needed for mild pain.     [name] albuterol (PROVENTIL HFA;VENTOLIN HFA) 108 (90 Base) MCG/ACT inhaler Inhale into the lungs every 4 (four) hours as needed for wheezing or shortness of breath.    [name] aspirin EC 81 MG tablet Take 81 mg by  mouth daily.    . citalopram (CELEXA) 20 MG tablet Take 20 mg by mouth daily.     [name] docusate sodium (COLACE) 100 MG capsule Take 100 mg by mouth 2 (two) times daily.    . enalapril (VASOTEC) 5 MG tablet Take 5 mg by mouth daily.    . fexofenadine (ALLEGRA) 180 MG tablet Take 180 mg by mouth daily.    [name] FIBER PO Take 1 tablet by mouth daily.     . finasteride (PROSCAR) 5 MG tablet Take 5 mg by mouth daily.    [name] gabapentin (NEURONTIN) 800 MG tablet Take 800 mg by mouth 3 (three) times daily.    . indapamide (LOZOL) 2.5 MG tablet Take 2.5 mg by mouth daily.  11  . Multiple Vitamins-Minerals (HAIR/SKIN/NAILS PO) Take 1 tablet by mouth 3 (three) times daily.     [name]  nitroGLYCERIN (NITROSTAT) 0.4 MG SL tablet Place 0.4 mg under the tongue every 5 (five) minutes as needed for chest pain.     . nortriptyline (PAMELOR) 25 MG capsule Take 25 mg by mouth at bedtime.     . pantoprazole (PROTONIX) 40 MG tablet Take 40 mg by mouth daily.     . potassium chloride SA (K-DUR,KLOR-CON) 20 MEQ tablet Take 20 mEq by mouth daily.    . rosuvastatin (CRESTOR) 20 MG tablet Take 20 mg by mouth at bedtime.     [name] Spacer/Aero-Holding Chambers (AEROCHAMBER MINI CHAMBER) DEVI by Does not apply route as directed.    . sucralfate (CARAFATE) 1 G tablet Take 1 g by mouth 4 (four) times daily.     . tamsulosin (FLOMAX) 0.4 MG CAPS capsule Take 0.4 mg by mouth daily.    [name] triamcinolone ointment (KENALOG) 0.1 % Apply topically.    . vitamin B-12 (CYANOCOBALAMIN) 1000 MCG tablet Take 1,000 mcg by mouth daily.     No current facility-administered medications for this visit.     Allergies:   Amitriptyline; Berniece Salines flavor; Cyclobenzaprine; Fish oil; Lidocaine; Other; and Percocet [oxycodone-acetaminophen]    Social History:  The patient  reports that he quit smoking about 26 years ago. His smoking use included cigarettes. He smoked 2.50 packs per day. He has never used smokeless tobacco. He reports that he drank alcohol. He reports  that he does not use drugs.   Family History:  The patient's family history includes Heart attack in his father; Heart disease (age of onset: 41) in his father.    ROS:  Please see the history of present illness.   Otherwise, review of systems are positive for none.   All other systems are reviewed and negative.    PHYSICAL EXAM: VS:  BP 106/70 (BP Location: Left Arm, Patient Position: Sitting, Cuff Size: Normal)   Pulse 63   Ht 5\' 9"  (1.753 m)   Wt 198 lb (89.8 kg)   BMI 29.24 kg/m  , BMI Body mass index is 29.24 kg/m. GEN: Well nourished, well developed, in no acute distress  HEENT: normal  Neck: no JVD, carotid bruits, or masses Cardiac: RRR; no murmurs, rubs, or gallops,no edema  Respiratory:  clear to auscultation bilaterally, normal work of breathing GI: soft, nontender, nondistended, + BS MS: no deformity or atrophy  Skin: warm and dry, no rash Neuro:  Strength and sensation are intact Psych: euthymic mood, full affect   EKG:  EKG is   ordered today. EKG showed normal sinus rhythm with nonspecific T wave changes.   Recent Labs: 05/27/2017: Creatinine, Ser 1.00    Lipid Panel No results found for: CHOL, TRIG, HDL, CHOLHDL, VLDL, LDLCALC, LDLDIRECT    Wt Readings from Last 3 Encounters:  02/24/18 198 lb (89.8 kg)  12/16/17 193 lb 8 oz (87.8 kg)  10/28/17 185 lb 6 oz (84.1 kg)         ASSESSMENT AND PLAN:  1.  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  Continue medical therapy.   Most recent stress test was abnormal but stable compared to prior stress test.  2. Essential hypertension: Blood pressure is well controlled and somewhat on the low side.  If blood pressure goes down further, enalapril was to be discontinued.  3. Chronic venous insufficiency: He reports that furosemide was discontinued due to kidney stones.  He is currently on indapamide.  4. Hyperlipidemia: Continue current dose of  rosuvastatin with a target LDL of less than 70.  5. Mild carotid disease: Previous carotid Doppler showed less than 40% stenosis bilaterally. No need to repeat.  6.  Postprandial abdominal pain: Given his known history of atherosclerosis, I requested mesenteric artery duplex to exclude chronic mesenteric ischemia.   Disposition:   FU with me in 6 months  Signed,  [name], MD  02/24/2018 4:14 PM    [facility]

## 2018-02-27 ENCOUNTER — Ambulatory Visit (INDEPENDENT_AMBULATORY_CARE_PROVIDER_SITE_OTHER): Payer: Medicare HMO

## 2018-02-27 DIAGNOSIS — R109 Unspecified abdominal pain: Secondary | ICD-10-CM

## 2018-04-06 DIAGNOSIS — G5600 Carpal tunnel syndrome, unspecified upper limb: Secondary | ICD-10-CM | POA: Insufficient documentation

## 2018-04-07 ENCOUNTER — Emergency Department: Payer: Medicare HMO

## 2018-04-07 ENCOUNTER — Encounter: Payer: Self-pay | Admitting: Emergency Medicine

## 2018-04-07 ENCOUNTER — Emergency Department
Admission: EM | Admit: 2018-04-07 | Discharge: 2018-04-07 | Disposition: A | Discharge status: Home / Self Care | Payer: Medicare HMO | Attending: Emergency Medicine | Admitting: Emergency Medicine

## 2018-04-07 DIAGNOSIS — Z87891 Personal history of nicotine dependence: Secondary | ICD-10-CM | POA: Insufficient documentation

## 2018-04-07 DIAGNOSIS — Z79899 Other long term (current) drug therapy: Secondary | ICD-10-CM | POA: Diagnosis not present

## 2018-04-07 DIAGNOSIS — I2510 Atherosclerotic heart disease of native coronary artery without angina pectoris: Secondary | ICD-10-CM | POA: Insufficient documentation

## 2018-04-07 DIAGNOSIS — I10 Essential (primary) hypertension: Secondary | ICD-10-CM | POA: Insufficient documentation

## 2018-04-07 DIAGNOSIS — I951 Orthostatic hypotension: Secondary | ICD-10-CM

## 2018-04-07 DIAGNOSIS — R110 Nausea: Secondary | ICD-10-CM | POA: Diagnosis present

## 2018-04-07 DIAGNOSIS — E785 Hyperlipidemia, unspecified: Secondary | ICD-10-CM | POA: Insufficient documentation

## 2018-04-07 DIAGNOSIS — R109 Unspecified abdominal pain: Secondary | ICD-10-CM | POA: Insufficient documentation

## 2018-04-07 LAB — CBC
HCT: 40.7 % (ref 39.0–52.0)
Hemoglobin: 13.4 g/dL (ref 13.0–17.0)
MCH: 32.1 pg (ref 26.0–34.0)
MCHC: 32.9 g/dL (ref 30.0–36.0)
MCV: 97.4 fL (ref 80.0–100.0)
PLATELETS: 141 10*3/uL — AB (ref 150–400)
RBC: 4.18 MIL/uL — ABNORMAL LOW (ref 4.22–5.81)
RDW: 12.6 % (ref 11.5–15.5)
WBC: 6.9 10*3/uL (ref 4.0–10.5)
nRBC: 0 % (ref 0.0–0.2)

## 2018-04-07 LAB — BASIC METABOLIC PANEL
Anion gap: 8 (ref 5–15)
BUN: 11 mg/dL (ref 8–23)
CO2: 28 mmol/L (ref 22–32)
CREATININE: 0.99 mg/dL (ref 0.61–1.24)
Calcium: 8.7 mg/dL — ABNORMAL LOW (ref 8.9–10.3)
Chloride: 102 mmol/L (ref 98–111)
GFR calc Af Amer: >60 mL/min (ref >60)
Glucose, Bld: 135 mg/dL — ABNORMAL HIGH (ref 70–99)
Potassium: 3.2 mmol/L — ABNORMAL LOW (ref 3.5–5.1)
Sodium: 138 mmol/L (ref 135–145)

## 2018-04-07 LAB — TROPONIN I

## 2018-04-07 MED ORDER — ONDANSETRON HCL 4 MG/2ML IJ SOLN
4.0000 mg | Freq: Once | INTRAMUSCULAR | Status: AC
  Administered 2018-04-07: 4 mg via INTRAVENOUS
  Filled 2018-04-07: qty 2

## 2018-04-07 MED ORDER — IOPAMIDOL (ISOVUE-300) INJECTION 61%
100.0000 mL | Freq: Once | INTRAVENOUS | Status: AC | PRN
  Administered 2018-04-07: 100 mL via INTRAVENOUS

## 2018-04-07 MED ORDER — SODIUM CHLORIDE 0.9 % IV BOLUS
500.0000 mL | Freq: Once | INTRAVENOUS | Status: AC
  Administered 2018-04-07: 500 mL via INTRAVENOUS

## 2018-04-07 MED ORDER — SODIUM CHLORIDE 0.9 % IV BOLUS: 1000.0000 mL | Freq: Once | INTRAVENOUS | Status: DC

## 2018-04-07 NOTE — ED Triage Notes (Signed)
Pt arrived via EMS from home where pt woke with sudden onset of nausea and dizziness/weakness upon standing. Pt orthostatic with EMS. Pt has hx/o same episodes in past. Hx/o cardiac cath.

## 2018-04-07 NOTE — ED Provider Notes (Signed)
Asked to follow up on CT and if no acute findings to d/c. Patient reports feeling improved, will give an additional 500 cc and patient would like to go home   Lavonia Drafts, MD 04/07/18 [phone]

## 2018-04-07 NOTE — ED Provider Notes (Signed)
[facility] Emergency Department Provider Note   First MD Initiated Contact with Patient 04/07/18 [phone]     (approximate)  I have reviewed the triage vital signs and the nursing notes.   HISTORY  Chief Complaint Nausea; Weakness; and Chest Pain   HPI Willie Clark is a 67 y.o. male with below list of chronic medical conditions presents to the emergency department via EMS with sudden onset of nausea dizziness" feeling like my blood pressure dropped when I stood up".  Patient states that he has had a longstanding history of episodes similar to this.  Patient noted to be orthostatic for EMS. patient denies any chest pain no shortness of breath.  Patient denies any vomiting diarrhea or constipation.  Patient does admit however to left lower quadrant abdominal discomfort.  Patient denies any fever.  Patient denies any recent illness no vomiting or diarrhea.  Patient admits to normal p.o. intake   Past Medical History:  Diagnosis Date  . (HFpEF) heart failure with preserved ejection fraction ([place])    a. 09/2016 Echo: EF 55-60%, no rwma, triv AI, mild MR, mildly dil LA.  [name] Anxiety   . Arthritis   . Barrett's esophagus   . Bowel obstruction ([place]) 02/2009   small bowel  . Chronic chest pain   . Coronary artery disease    a. 1993 CABG; b. 2007 Redo CABG; c. 09/2015 Cath: LM mild dzs, LAD 100ost, RI mild dzs, LCX 60p, OM1 100, RCA 100ost, VG->OM2 min irregs, LIMA->LAD nl, VG->RPDA 30ost, 20d, nl EF->Med rx.  . Depression   . Diverticulitis   . Diverticulosis   . Erectile dysfunction   . Fibromyalgia   . Gastric ulcer   . Gastritis   . GERD (gastroesophageal reflux disease)   . Headache   . Hemorrhoids   . Hyperlipidemia   . Hypertension   . Kidney stones    going to [facility]  June 26 to see kidney specialist  . Lightheadedness   . Mass on back   . MI (myocardial infarction) ([place])   . Nephrolithiasis   . Orthostatic hypotension    a. Improved after  discontinuation of metoprolol.  . OSA (obstructive sleep apnea)    a. On CPAP.  [name] Pulmonary embolism ([place]) 01/2006   Post-op/treated  . Skin lesions, generalized   . Stroke [facility]) 1993   right side weakness no blood thinners   on Aspirin  . Syncope and collapse   . Urticaria     Patient Active Problem List   Diagnosis Date Noted  . Synovial cyst of lumbar facet joint 10/01/2016  . Kidney stones   . Ureteral obstruction   . Obstructive uropathy 09/24/2015  . Abnormal stress test   . 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 Chronic combined systolic and diastolic congestive heart failure ([place])   . Essential hypertension   . Preoperative cardiovascular examination   . Bilateral leg edema 01/10/2015  . TIA (transient ischemic attack) 08/13/2014  . Intermittent claudication ([place]) 06/07/2013  . Abdominal pain 05/16/2012  . Dyspnea 11/21/2011  . Sinus bradycardia 07/12/2010  . ED (erectile dysfunction) 07/12/2010  . Coronary artery disease   . Hypertension   . Hyperlipidemia     Past Surgical History:  Procedure Laterality Date  . BACK SURGERY    . bowel obstruction  01/2009  . CARDIAC CATHETERIZATION  11/2006   patent grafts. Significant OM3 disease and occluded diagonals.  [name] CARDIAC CATHETERIZATION  06/2010   patent grafts. significant ISR in proximal LCX but OM2 is bypassed and gives retrograde flow to OM3.   [name] CARDIAC CATHETERIZATION  06/2013   [facility]: Patent grafts with 60% proximal in-stent restenosis in the left circumflex with FFR of 0.85  . CARDIAC CATHETERIZATION Left 09/25/2015   Procedure: Left Heart Cath and Cors/Grafts Angiography;  Surgeon: Wellington Hampshire, MD;  Location: [place] CV LAB;  Service: Cardiovascular;  Laterality: Left;  . CERVICAL FUSION    . CHOLECYSTECTOMY    . COLON SURGERY  01/2009   BOWEL RESECTION DUE TO SMALL BOWEL OBSTRUCTION  . COLONOSCOPY    . COLONOSCOPY WITH PROPOFOL N/A 10/28/2017   Procedure: COLONOSCOPY  WITH PROPOFOL;  Surgeon: Lollie Sails, MD;  Location: [facility] ENDOSCOPY;  Service: Endoscopy;  Laterality: N/A;  . CORONARY ANGIOPLASTY  2006  . CORONARY ARTERY BYPASS GRAFT  1993/01/2006   redo at [facility]. LIMA to LAD, SVG to OM2 and SVG to RPDA  . CYSTOSCOPY WITH STENT PLACEMENT Bilateral 09/24/2015   Procedure: CYSTOSCOPY WITH BILATERAL RETROGRADES, BILATERAL STENT PLACEMENT;  Surgeon: Festus Aloe, MD;  Location: [facility] ORS;  Service: Urology;  Laterality: Bilateral;  . ESOPHAGOGASTRODUODENOSCOPY N/A 10/25/2014   Procedure: ESOPHAGOGASTRODUODENOSCOPY (EGD);  Surgeon: Lollie Sails, MD;  Location: [facility] ENDOSCOPY;  Service: Endoscopy;  Laterality: N/A;  . ESOPHAGOGASTRODUODENOSCOPY (EGD) WITH PROPOFOL N/A 02/03/2017   Procedure: ESOPHAGOGASTRODUODENOSCOPY (EGD) WITH PROPOFOL;  Surgeon: Lollie Sails, MD;  Location: [facility] ENDOSCOPY;  Service: Endoscopy;  Laterality: N/A;  . ESOPHAGOGASTRODUODENOSCOPY (EGD) WITH PROPOFOL N/A 04/18/2017   Procedure: ESOPHAGOGASTRODUODENOSCOPY (EGD) WITH PROPOFOL;  Surgeon: Lollie Sails, MD;  Location: [facility] ENDOSCOPY;  Service: Endoscopy;  Laterality: N/A;  . EYE SURGERY Bilateral    Cataract Extraction with IOL  . HERNIA REPAIR    . INGUINAL HERNIA REPAIR Bilateral 01/23/2016   Procedure: HERNIA REPAIR INGUINAL ADULT BILATERAL;  Surgeon: Leonie Green, MD;  Location: [facility] ORS;  Service: General;  Laterality: Bilateral;  . LUMBAR LAMINECTOMY/DECOMPRESSION MICRODISCECTOMY Left 10/01/2016   Procedure: Left Lumbar four-five Laminotomy for resection of synovial cyst;  Surgeon: Erline Levine, MD;  Location: [place];  Service: Neurosurgery;  Laterality: Left;  left  . NECK SURGERY  06/2009  . SHOULDER SURGERY  2010    Prior to Admission medications   Medication Sig Start Date End Date Taking? Authorizing Provider  acetaminophen (TYLENOL) 500 MG tablet Take 500 mg by mouth every 8 (eight) hours as needed for mild pain.  06/09/12   [provider]   albuterol (PROVENTIL HFA;VENTOLIN HFA) 108 (90 Base) MCG/ACT inhaler Inhale into the lungs every 4 (four) hours as needed for wheezing or shortness of breath.    [provider]  aspirin EC 81 MG tablet Take 81 mg by mouth daily.    [provider]  citalopram (CELEXA) 20 MG tablet Take 20 mg by mouth daily.     [provider]  docusate sodium (COLACE) 100 MG capsule Take 100 mg by mouth 2 (two) times daily.    [provider]  enalapril (VASOTEC) 5 MG tablet Take 5 mg by mouth daily. 12/11/17   [provider]  fexofenadine (ALLEGRA) 180 MG tablet Take 180 mg by mouth daily.    [provider]  FIBER PO Take 1 tablet by mouth daily.     [provider]  finasteride (PROSCAR) 5 MG tablet Take 5 mg by mouth daily.    [provider]  gabapentin (NEURONTIN) 800 MG tablet Take  800 mg by mouth 3 (three) times daily.    [provider]  indapamide (LOZOL) 2.5 MG tablet Take 2.5 mg by mouth daily. 10/17/17   [provider]  Multiple Vitamins-Minerals (HAIR/SKIN/NAILS PO) Take 1 tablet by mouth 3 (three) times daily.     [provider]  nitroGLYCERIN (NITROSTAT) 0.4 MG SL tablet Place 0.4 mg under the tongue every 5 (five) minutes as needed for chest pain.     [provider]  nortriptyline (PAMELOR) 25 MG capsule Take 25 mg by mouth at bedtime.  07/25/14   [provider]  pantoprazole (PROTONIX) 40 MG tablet Take 40 mg by mouth daily.     [provider]  potassium chloride SA (K-DUR,KLOR-CON) 20 MEQ tablet Take 20 mEq by mouth daily.    [provider]  rosuvastatin (CRESTOR) 20 MG tablet Take 20 mg by mouth at bedtime.     [provider]  Spacer/Aero-Holding Chambers (AEROCHAMBER MINI CHAMBER) DEVI by Does not apply route as directed.    [provider]  sucralfate (CARAFATE) 1 G tablet Take 1 g by mouth 4 (four) times daily.  06/09/12   [provider]  tamsulosin (FLOMAX) 0.4 MG CAPS capsule Take 0.4 mg by mouth daily.    [provider]  triamcinolone ointment (KENALOG) 0.1 % Apply topically. 01/01/16   [provider]  vitamin B-12 (CYANOCOBALAMIN) 1000 MCG tablet Take 1,000 mcg by mouth daily.    [provider]    Allergies Amitriptyline; Berniece Salines flavor; Cyclobenzaprine; Fish oil; Lidocaine; Other; and Percocet [oxycodone-acetaminophen]  Family History  Problem Relation Age of Onset  . Heart disease Father 33  . Heart attack Father     Social History Social History   Tobacco Use  . Smoking status: Former Smoker    Packs/day: 2.50    Types: Cigarettes    Last attempt to quit: 08/21/1991    Years since quitting: 26.6  . Smokeless tobacco: Never Used  Substance Use Topics  . Alcohol use: Not Currently  . Drug use: No    Review of Systems Constitutional: No fever/chills Eyes: No visual changes. ENT: No sore throat. Cardiovascular: Denies chest pain. Respiratory: Denies shortness of breath. Gastrointestinal: Positive for abdominal pain &  nausea, no vomiting.  No diarrhea.  No constipation. Genitourinary: Negative for dysuria. Musculoskeletal: Negative for neck pain.  Negative for back pain. Integumentary: Negative for rash. Neurological: Negative for headaches, focal weakness or numbness.   ____________________________________________   PHYSICAL EXAM:  VITAL SIGNS: ED Triage Vitals [04/07/18 0530]  Enc Vitals Group     BP 126/82     Pulse      Resp 18     Temp      Temp src      SpO2      Weight      Height      Head Circumference      Peak Flow      Pain Score      Pain Loc      Pain Edu?      Excl. in [place]?     Constitutional: Alert and oriented. Well appearing and in no acute distress. Eyes: Conjunctivae are normal.  Mouth/Throat: Mucous membranes are moist.  Oropharynx non-erythematous. Neck: No stridor.  Cardiovascular: Normal rate, regular rhythm. Good  peripheral circulation. Grossly normal heart sounds. Respiratory: Normal respiratory effort.  No retractions. Lungs CTAB. Gastrointestinal: Soft and nontender. No distention.   Musculoskeletal: No lower extremity tenderness  nor edema. No gross deformities of extremities. Neurologic:  Normal speech and language. No gross focal neurologic deficits are appreciated.  Skin:  Skin is warm, dry and intact. No rash noted. Psychiatric: Mood and affect are normal. Speech and behavior are normal.  ____________________________________________   LABS (all labs ordered are listed, but only abnormal results are displayed)  Labs Reviewed  BASIC METABOLIC PANEL - Abnormal; Notable for the following components:      Result Value   Potassium 3.2 (*)    Glucose, Bld 135 (*)    Calcium 8.7 (*)    All other components within normal limits  CBC - Abnormal; Notable for the following components:   RBC 4.18 (*)    Platelets 141 (*)    All other components within normal limits  TROPONIN I   ____________________________________________  EKG  ED ECG REPORT I, [place] N BROWN, the attending physician, personally viewed and interpreted this ECG.   Date: 04/07/2018  EKG Time: 5:31 AM  Rate: 63  Rhythm: Normal sinus rhythm  Axis: Normal  Intervals: Normal  ST&T Change: None  ____________________________________________  RADIOLOGY I, [place] N BROWN, personally viewed and evaluated these images (plain radiographs) as part of my medical decision making, as well as reviewing the written report by the radiologist.  ED MD interpretation: No acute cardiopulmonary process per radiologist  Official radiology report(s): Dg Chest 2 View  Result Date: 04/07/2018 CLINICAL DATA:  Nausea, dizziness.  History of pulmonary embolism. EXAM: CHEST - 2 VIEW COMPARISON:  Chest radiograph Sep 14, 2015 FINDINGS: Cardiomediastinal silhouette is normal, status post median sternotomy for CABG. No pleural effusion or  focal consolidation. LEFT lung base scarring. No pneumothorax. ACDF. LEFT humeral head screw. Surgical clips in the included right abdomen compatible with cholecystectomy. IMPRESSION: No acute cardiopulmonary process. Electronically Signed   By: Elon Alas M.D.   On: 04/07/2018 06:07      Procedures   ____________________________________________   INITIAL IMPRESSION / ASSESSMENT AND PLAN / ED COURSE  As part of my medical decision making, I reviewed the following data within the electronic MEDICAL RECORD NUMBER 67 year old male presented with above-stated history and physical exam with noted orthostatic hypotension for EMS which was also documented here in the emergency department.  Patient given 500 mL's IV normal saline.  Given left lower quadrant abdominal discomfort with known history of diverticulosis concern for possible diverticulitis and as such CT abdomen performed and pending at this time.  Anticipate if CT abdomen is negative patient will be able to be discharged home.  Patient's care transferred to Dr. Corky Downs ____________________________________________  FINAL CLINICAL IMPRESSION(S) / ED DIAGNOSES  Final diagnoses:  Orthostatic hypotension     MEDICATIONS GIVEN DURING THIS VISIT:  Medications  ondansetron (ZOFRAN) injection 4 mg (4 mg Intravenous Given 04/07/18 0536)  sodium chloride 0.9 % bolus 500 mL (0 mLs Intravenous Stopped 04/07/18 0618)  iopamidol (ISOVUE-300) 61 % injection 100 mL (100 mLs Intravenous Contrast Given 04/07/18 0615)     ED Discharge Orders    None       Note:  This document was prepared using Dragon voice recognition software and may include unintentional dictation errors.    Gregor Hams, MD 04/07/18 2251

## 2018-04-24 ENCOUNTER — Other Ambulatory Visit: Payer: Self-pay | Admitting: Neurosurgery

## 2018-04-24 DIAGNOSIS — M5416 Radiculopathy, lumbar region: Secondary | ICD-10-CM

## 2018-05-04 ENCOUNTER — Ambulatory Visit
Admission: RE | Admit: 2018-05-04 | Discharge: 2018-05-04 | Disposition: A | Discharge status: Home / Self Care | Payer: Medicare HMO | Source: Ambulatory Visit | Attending: Neurosurgery | Admitting: Neurosurgery

## 2018-05-04 DIAGNOSIS — M5416 Radiculopathy, lumbar region: Secondary | ICD-10-CM | POA: Insufficient documentation

## 2018-05-04 MED ORDER — GADOBUTROL 1 MMOL/ML IV SOLN
9.0000 mL | Freq: Once | INTRAVENOUS | Status: AC | PRN
  Administered 2018-05-04: 9 mL via INTRAVENOUS

## 2018-07-31 ENCOUNTER — Other Ambulatory Visit: Payer: Self-pay

## 2018-07-31 ENCOUNTER — Emergency Department: Payer: Medicare HMO

## 2018-07-31 ENCOUNTER — Observation Stay
Admission: EM | Admit: 2018-07-31 | Discharge: 2018-08-02 | Disposition: A | Discharge status: Home / Self Care | Payer: Medicare HMO | Attending: Internal Medicine | Admitting: Internal Medicine

## 2018-07-31 ENCOUNTER — Encounter: Payer: Self-pay | Admitting: Emergency Medicine

## 2018-07-31 DIAGNOSIS — Z885 Allergy status to narcotic agent status: Secondary | ICD-10-CM | POA: Insufficient documentation

## 2018-07-31 DIAGNOSIS — Z8673 Personal history of transient ischemic attack (TIA), and cerebral infarction without residual deficits: Secondary | ICD-10-CM | POA: Diagnosis not present

## 2018-07-31 DIAGNOSIS — I25118 Atherosclerotic heart disease of native coronary artery with other forms of angina pectoris: Secondary | ICD-10-CM | POA: Diagnosis not present

## 2018-07-31 DIAGNOSIS — G4733 Obstructive sleep apnea (adult) (pediatric): Secondary | ICD-10-CM | POA: Diagnosis not present

## 2018-07-31 DIAGNOSIS — M797 Fibromyalgia: Secondary | ICD-10-CM | POA: Diagnosis not present

## 2018-07-31 DIAGNOSIS — I252 Old myocardial infarction: Secondary | ICD-10-CM | POA: Insufficient documentation

## 2018-07-31 DIAGNOSIS — F419 Anxiety disorder, unspecified: Secondary | ICD-10-CM | POA: Diagnosis not present

## 2018-07-31 DIAGNOSIS — I110 Hypertensive heart disease with heart failure: Secondary | ICD-10-CM | POA: Diagnosis not present

## 2018-07-31 DIAGNOSIS — E785 Hyperlipidemia, unspecified: Secondary | ICD-10-CM | POA: Insufficient documentation

## 2018-07-31 DIAGNOSIS — R1030 Lower abdominal pain, unspecified: Secondary | ICD-10-CM | POA: Diagnosis not present

## 2018-07-31 DIAGNOSIS — Z87891 Personal history of nicotine dependence: Secondary | ICD-10-CM | POA: Insufficient documentation

## 2018-07-31 DIAGNOSIS — I429 Cardiomyopathy, unspecified: Secondary | ICD-10-CM | POA: Insufficient documentation

## 2018-07-31 DIAGNOSIS — K5792 Diverticulitis of intestine, part unspecified, without perforation or abscess without bleeding: Secondary | ICD-10-CM | POA: Diagnosis not present

## 2018-07-31 DIAGNOSIS — R0789 Other chest pain: Secondary | ICD-10-CM | POA: Diagnosis not present

## 2018-07-31 DIAGNOSIS — K219 Gastro-esophageal reflux disease without esophagitis: Secondary | ICD-10-CM | POA: Insufficient documentation

## 2018-07-31 DIAGNOSIS — Z86711 Personal history of pulmonary embolism: Secondary | ICD-10-CM | POA: Diagnosis not present

## 2018-07-31 DIAGNOSIS — R079 Chest pain, unspecified: Secondary | ICD-10-CM | POA: Diagnosis present

## 2018-07-31 DIAGNOSIS — F329 Major depressive disorder, single episode, unspecified: Secondary | ICD-10-CM | POA: Insufficient documentation

## 2018-07-31 DIAGNOSIS — Z888 Allergy status to other drugs, medicaments and biological substances status: Secondary | ICD-10-CM | POA: Insufficient documentation

## 2018-07-31 DIAGNOSIS — I5042 Chronic combined systolic (congestive) and diastolic (congestive) heart failure: Secondary | ICD-10-CM | POA: Diagnosis not present

## 2018-07-31 DIAGNOSIS — Z79899 Other long term (current) drug therapy: Secondary | ICD-10-CM | POA: Diagnosis not present

## 2018-07-31 DIAGNOSIS — M549 Dorsalgia, unspecified: Secondary | ICD-10-CM | POA: Diagnosis not present

## 2018-07-31 DIAGNOSIS — Z8249 Family history of ischemic heart disease and other diseases of the circulatory system: Secondary | ICD-10-CM | POA: Diagnosis not present

## 2018-07-31 DIAGNOSIS — Z951 Presence of aortocoronary bypass graft: Secondary | ICD-10-CM | POA: Diagnosis not present

## 2018-07-31 DIAGNOSIS — G8929 Other chronic pain: Secondary | ICD-10-CM | POA: Diagnosis not present

## 2018-07-31 DIAGNOSIS — Z7982 Long term (current) use of aspirin: Secondary | ICD-10-CM | POA: Insufficient documentation

## 2018-07-31 DIAGNOSIS — M542 Cervicalgia: Secondary | ICD-10-CM | POA: Diagnosis not present

## 2018-07-31 LAB — BASIC METABOLIC PANEL
Anion gap: 9 (ref 5–15)
BUN: 12 mg/dL (ref 8–23)
CO2: 33 mmol/L — ABNORMAL HIGH (ref 22–32)
Calcium: 9.3 mg/dL (ref 8.9–10.3)
Chloride: 94 mmol/L — ABNORMAL LOW (ref 98–111)
Creatinine, Ser: 0.78 mg/dL (ref 0.61–1.24)
GFR calc Af Amer: >60 mL/min (ref >60)
GFR calc non Af Amer: >60 mL/min (ref >60)
Glucose, Bld: 144 mg/dL — ABNORMAL HIGH (ref 70–99)
Potassium: 4.1 mmol/L (ref 3.5–5.1)
Sodium: 136 mmol/L (ref 135–145)

## 2018-07-31 LAB — CBC
HCT: 45.5 % (ref 39.0–52.0)
Hemoglobin: 15.3 g/dL (ref 13.0–17.0)
MCH: 32.7 pg (ref 26.0–34.0)
MCHC: 33.6 g/dL (ref 30.0–36.0)
MCV: 97.2 fL (ref 80.0–100.0)
Platelets: 177 10*3/uL (ref 150–400)
RBC: 4.68 MIL/uL (ref 4.22–5.81)
RDW: 13.2 % (ref 11.5–15.5)
WBC: 9.1 10*3/uL (ref 4.0–10.5)
nRBC: 0 % (ref 0.0–0.2)

## 2018-07-31 LAB — APTT: aPTT: 26 seconds (ref 24–36)

## 2018-07-31 LAB — TROPONIN I
Troponin I: <0.03 ng/mL (ref <0.03)
Troponin I: <0.03 ng/mL (ref <0.03)

## 2018-07-31 LAB — PROTIME-INR
INR: 0.8 (ref 0.8–1.2)
Prothrombin Time: 11.3 seconds — ABNORMAL LOW (ref 11.4–15.2)

## 2018-07-31 MED ORDER — ALBUTEROL SULFATE (2.5 MG/3ML) 0.083% IN NEBU: 2.5000 mg | INHALATION_SOLUTION | RESPIRATORY_TRACT | Status: DC | PRN

## 2018-07-31 MED ORDER — CITALOPRAM HYDROBROMIDE 20 MG PO TABS
20.0000 mg | ORAL_TABLET | Freq: Every day | ORAL | Status: DC
  Administered 2018-08-01 – 2018-08-02 (×2): 20 mg via ORAL
  Filled 2018-07-31 (×2): qty 1

## 2018-07-31 MED ORDER — ROSUVASTATIN CALCIUM 10 MG PO TABS
20.0000 mg | ORAL_TABLET | Freq: Every day | ORAL | Status: DC
  Administered 2018-07-31 – 2018-08-01 (×2): 20 mg via ORAL
  Filled 2018-07-31 (×2): qty 2

## 2018-07-31 MED ORDER — PANTOPRAZOLE SODIUM 40 MG PO TBEC
40.0000 mg | DELAYED_RELEASE_TABLET | Freq: Two times a day (BID) | ORAL | Status: DC
  Administered 2018-07-31 – 2018-08-02 (×4): 40 mg via ORAL
  Filled 2018-07-31 (×4): qty 1

## 2018-07-31 MED ORDER — AEROCHAMBER MINI CHAMBER DEVI: Status: DC

## 2018-07-31 MED ORDER — MORPHINE SULFATE (PF) 4 MG/ML IV SOLN
4.0000 mg | INTRAVENOUS | Status: DC | PRN
  Administered 2018-08-01 – 2018-08-02 (×5): 4 mg via INTRAVENOUS
  Filled 2018-07-31 (×5): qty 1

## 2018-07-31 MED ORDER — METRONIDAZOLE IN NACL 5-0.79 MG/ML-% IV SOLN
500.0000 mg | Freq: Three times a day (TID) | INTRAVENOUS | Status: DC
  Administered 2018-07-31 – 2018-08-02 (×6): 500 mg via INTRAVENOUS
  Filled 2018-07-31 (×8): qty 100

## 2018-07-31 MED ORDER — ASPIRIN 81 MG PO CHEW
324.0000 mg | CHEWABLE_TABLET | Freq: Once | ORAL | Status: AC
  Administered 2018-07-31: 324 mg via ORAL
  Filled 2018-07-31: qty 4

## 2018-07-31 MED ORDER — PSYLLIUM 95 % PO PACK
1.0000 | PACK | Freq: Every day | ORAL | Status: DC
  Administered 2018-08-01 – 2018-08-02 (×2): 1 via ORAL
  Filled 2018-07-31 (×2): qty 1

## 2018-07-31 MED ORDER — LEVOFLOXACIN IN D5W 500 MG/100ML IV SOLN
500.0000 mg | INTRAVENOUS | Status: DC
  Administered 2018-07-31 – 2018-08-01 (×2): 500 mg via INTRAVENOUS
  Filled 2018-07-31 (×3): qty 100

## 2018-07-31 MED ORDER — AMOXICILLIN-POT CLAVULANATE 875-125 MG PO TABS
1.0000 | ORAL_TABLET | Freq: Once | ORAL | Status: AC
  Administered 2018-07-31: 1 via ORAL
  Filled 2018-07-31: qty 1

## 2018-07-31 MED ORDER — LORATADINE 10 MG PO TABS
10.0000 mg | ORAL_TABLET | Freq: Every day | ORAL | Status: DC
  Administered 2018-08-01 – 2018-08-02 (×2): 10 mg via ORAL
  Filled 2018-07-31 (×2): qty 1

## 2018-07-31 MED ORDER — DOCUSATE SODIUM 100 MG PO CAPS
100.0000 mg | ORAL_CAPSULE | Freq: Two times a day (BID) | ORAL | Status: DC
  Administered 2018-07-31 – 2018-08-02 (×4): 100 mg via ORAL
  Filled 2018-07-31 (×4): qty 1

## 2018-07-31 MED ORDER — GABAPENTIN 400 MG PO CAPS
800.0000 mg | ORAL_CAPSULE | Freq: Three times a day (TID) | ORAL | Status: DC
  Administered 2018-07-31 – 2018-08-02 (×5): 800 mg via ORAL
  Filled 2018-07-31 (×5): qty 2

## 2018-07-31 MED ORDER — PSYLLIUM 0.52 G PO CAPS: 0.5200 g | ORAL_CAPSULE | Freq: Every day | ORAL | Status: DC

## 2018-07-31 MED ORDER — INDAPAMIDE 2.5 MG PO TABS
2.5000 mg | ORAL_TABLET | Freq: Every day | ORAL | Status: DC
  Administered 2018-08-01 – 2018-08-02 (×2): 2.5 mg via ORAL
  Filled 2018-07-31 (×2): qty 1

## 2018-07-31 MED ORDER — TRIAMCINOLONE ACETONIDE 0.1 % EX OINT
1.0000 "application " | TOPICAL_OINTMENT | Freq: Two times a day (BID) | CUTANEOUS | Status: DC
  Filled 2018-07-31: qty 15

## 2018-07-31 MED ORDER — ONDANSETRON HCL 4 MG/2ML IJ SOLN: 4.0000 mg | Freq: Four times a day (QID) | INTRAMUSCULAR | Status: DC | PRN

## 2018-07-31 MED ORDER — ACETAMINOPHEN 500 MG PO TABS: 500.0000 mg | ORAL_TABLET | Freq: Three times a day (TID) | ORAL | Status: DC | PRN

## 2018-07-31 MED ORDER — ENOXAPARIN SODIUM 40 MG/0.4ML ~~LOC~~ SOLN
40.0000 mg | SUBCUTANEOUS | Status: DC
  Administered 2018-07-31 – 2018-08-01 (×2): 40 mg via SUBCUTANEOUS
  Filled 2018-07-31 (×2): qty 0.4

## 2018-07-31 MED ORDER — FENTANYL CITRATE (PF) 100 MCG/2ML IJ SOLN
12.5000 ug | Freq: Once | INTRAMUSCULAR | Status: AC
  Administered 2018-07-31: 12.5 ug via INTRAVENOUS
  Filled 2018-07-31: qty 2

## 2018-07-31 MED ORDER — FINASTERIDE 5 MG PO TABS
5.0000 mg | ORAL_TABLET | Freq: Every day | ORAL | Status: DC
  Administered 2018-08-01 – 2018-08-02 (×2): 5 mg via ORAL
  Filled 2018-07-31 (×2): qty 1

## 2018-07-31 MED ORDER — ACETAMINOPHEN 325 MG PO TABS: 650.0000 mg | ORAL_TABLET | ORAL | Status: DC | PRN

## 2018-07-31 MED ORDER — IOPAMIDOL (ISOVUE-370) INJECTION 76%
100.0000 mL | Freq: Once | INTRAVENOUS | Status: AC | PRN
  Administered 2018-07-31: 100 mL via INTRAVENOUS

## 2018-07-31 MED ORDER — SODIUM CHLORIDE 0.9% FLUSH
3.0000 mL | Freq: Once | INTRAVENOUS | Status: AC
  Administered 2018-07-31: 3 mL via INTRAVENOUS

## 2018-07-31 MED ORDER — ONDANSETRON HCL 4 MG/2ML IJ SOLN
4.0000 mg | Freq: Once | INTRAMUSCULAR | Status: AC
  Administered 2018-07-31: 4 mg via INTRAVENOUS
  Filled 2018-07-31: qty 2

## 2018-07-31 MED ORDER — HYDROCODONE-ACETAMINOPHEN 5-325 MG PO TABS: 1.0000 | ORAL_TABLET | Freq: Four times a day (QID) | ORAL | Status: DC | PRN

## 2018-07-31 MED ORDER — ASPIRIN EC 81 MG PO TBEC
81.0000 mg | DELAYED_RELEASE_TABLET | Freq: Every day | ORAL | Status: DC
  Administered 2018-08-01 – 2018-08-02 (×2): 81 mg via ORAL
  Filled 2018-07-31 (×2): qty 1

## 2018-07-31 MED ORDER — SODIUM CHLORIDE 0.9 % IV BOLUS
1000.0000 mL | Freq: Once | INTRAVENOUS | Status: AC
  Administered 2018-07-31: 1000 mL via INTRAVENOUS

## 2018-07-31 MED ORDER — SODIUM CHLORIDE 0.9 % IV SOLN
INTRAVENOUS | Status: DC | PRN
  Administered 2018-07-31 – 2018-08-01 (×3): 250 mL via INTRAVENOUS

## 2018-07-31 MED ORDER — VITAMIN B-12 1000 MCG PO TABS
1000.0000 ug | ORAL_TABLET | Freq: Every day | ORAL | Status: DC
  Administered 2018-08-01 – 2018-08-02 (×2): 1000 ug via ORAL
  Filled 2018-07-31 (×2): qty 1

## 2018-07-31 MED ORDER — NITROGLYCERIN 0.4 MG SL SUBL
0.4000 mg | SUBLINGUAL_TABLET | SUBLINGUAL | Status: DC | PRN
  Administered 2018-07-31: 0.4 mg via SUBLINGUAL
  Filled 2018-07-31: qty 1

## 2018-07-31 MED ORDER — MORPHINE SULFATE (PF) 2 MG/ML IV SOLN
2.0000 mg | INTRAVENOUS | Status: DC | PRN
  Administered 2018-07-31 – 2018-08-01 (×2): 2 mg via INTRAVENOUS
  Filled 2018-07-31 (×2): qty 1

## 2018-07-31 NOTE — ED Notes (Signed)
Patient transported to CT 

## 2018-07-31 NOTE — ED Notes (Signed)
Pt trial off non-rebreather and maintained 02 sat of 100% after 5 minutes. Dr Jacqualine Code notified.

## 2018-07-31 NOTE — ED Triage Notes (Signed)
Pt to ED via POV c/o left sided chest pain that radiates into his left shoulder and into his left neck. Pt spoke with his PCPs office on Wednesday and they gave him pain medication. Pt states that he has been having periods of sweating and nausea. Pt states that the pain is worse when he takes a deep breathing. Pt is in NAD at this time.

## 2018-07-31 NOTE — ED Notes (Addendum)
ED TO INPATIENT HANDOFF REPORT  ED Nurse Name and Phone #:  Gershon Mussel RN    [phone]  S Name/Age/Gender Willie Clark 68 y.o. male Room/Bed: ED08A/ED08A  Code Status   Code Status: Prior  Home/SNF/Other Home Patient oriented to: self, place, time and situation Is this baseline? Yes   Triage Complete: Triage complete  Chief Complaint Chest Pain  Triage Note Pt to ED via POV c/o left sided chest pain that radiates into his left shoulder and into his left neck. Pt spoke with his PCPs office on Wednesday and they gave him pain medication. Pt states that he has been having periods of sweating and nausea. Pt states that the pain is worse when he takes a deep breathing. Pt is in NAD at this time.    Allergies Allergies  Allergen Reactions  . Amitriptyline     Pt unsure of what happens  . Berniece Salines Flavor Diarrhea  . Cyclobenzaprine Other (See Comments)    Pt unsure what hapens  . Fish Oil Itching  . Lidocaine Nausea And Vomiting    Nasal Spray (only time had reaction)  . Other Diarrhea    Reactive agents: Onions, bacon, steak  . Percocet [Oxycodone-Acetaminophen] Other (See Comments)    Lowers BP    Level of Care/Admitting Diagnosis ED Disposition    ED Disposition Condition [facility] Area: [place] [[id number]]  Level of Care: Telemetry [5]  Diagnosis: Chest pain [671245]  Admitting Physician: Otila Back [[id number]]  Attending Physician: Otila Back [[id number]]  PT Class (Do Not Modify): Observation [104]  PT Acc Code (Do Not Modify): Observation [10022]       B Medical/Surgery History Past Medical History:  Diagnosis Date  . (HFpEF) heart failure with preserved ejection fraction ([place])    a. 09/2016 Echo: EF 55-60%, no rwma, triv AI, mild MR, mildly dil LA.  [name] Anxiety   . Arthritis   . Barrett's esophagus   . Bowel obstruction ([place]) 02/2009   small bowel  . Chronic chest pain   . Coronary artery disease    a. 1993 CABG; b. 2007 Redo CABG; c.  09/2015 Cath: LM mild dzs, LAD 100ost, RI mild dzs, LCX 60p, OM1 100, RCA 100ost, VG->OM2 min irregs, LIMA->LAD nl, VG->RPDA 30ost, 20d, nl EF->Med rx.  . Depression   . Diverticulitis   . Diverticulosis   . Erectile dysfunction   . Fibromyalgia   . Gastric ulcer   . Gastritis   . GERD (gastroesophageal reflux disease)   . Headache   . Hemorrhoids   . Hyperlipidemia   . Hypertension   . Kidney stones    going to [facility]  June 26 to see kidney specialist  . Lightheadedness   . Mass on back   . MI (myocardial infarction) ([place])   . Nephrolithiasis   . Orthostatic hypotension    a. Improved after discontinuation of metoprolol.  . OSA (obstructive sleep apnea)    a. On CPAP.  [name] Pulmonary embolism ([place]) 01/2006   Post-op/treated  . Skin lesions, generalized   . Stroke [facility]) 1993   right side weakness no blood thinners   on Aspirin  . Syncope and collapse   . Urticaria    Past Surgical History:  Procedure Laterality Date  . BACK SURGERY    . bowel obstruction  01/2009  . CARDIAC CATHETERIZATION  11/2006   patent grafts. Significant OM3 disease and occluded diagonals.  [name] CARDIAC CATHETERIZATION  06/2010  patent grafts. significant ISR in proximal LCX but OM2 is bypassed and gives retrograde flow to OM3.   [name] CARDIAC CATHETERIZATION  06/2013   [facility]: Patent grafts with 60% proximal in-stent restenosis in the left circumflex with FFR of 0.85  . CARDIAC CATHETERIZATION Left 09/25/2015   Procedure: Left Heart Cath and Cors/Grafts Angiography;  Surgeon: Wellington Hampshire, MD;  Location: [facility];  Service: Cardiovascular;  Laterality: Left;  . CERVICAL FUSION    . CHOLECYSTECTOMY    . COLON SURGERY  01/2009   BOWEL RESECTION DUE TO SMALL BOWEL OBSTRUCTION  . COLONOSCOPY    . COLONOSCOPY WITH PROPOFOL N/A 10/28/2017   Procedure: COLONOSCOPY WITH PROPOFOL;  Surgeon: Lollie Sails, MD;  Location: [facility] ENDOSCOPY;  Service: Endoscopy;  Laterality: N/A;  . CORONARY  ANGIOPLASTY  2006  . CORONARY ARTERY BYPASS GRAFT  1993/01/2006   redo at [facility]. LIMA to LAD, SVG to OM2 and SVG to RPDA  . CYSTOSCOPY WITH STENT PLACEMENT Bilateral 09/24/2015   Procedure: CYSTOSCOPY WITH BILATERAL RETROGRADES, BILATERAL STENT PLACEMENT;  Surgeon: Festus Aloe, MD;  Location: [facility] ORS;  Service: Urology;  Laterality: Bilateral;  . ESOPHAGOGASTRODUODENOSCOPY N/A 10/25/2014   Procedure: ESOPHAGOGASTRODUODENOSCOPY (EGD);  Surgeon: Lollie Sails, MD;  Location: [facility] ENDOSCOPY;  Service: Endoscopy;  Laterality: N/A;  . ESOPHAGOGASTRODUODENOSCOPY (EGD) WITH PROPOFOL N/A 02/03/2017   Procedure: ESOPHAGOGASTRODUODENOSCOPY (EGD) WITH PROPOFOL;  Surgeon: Lollie Sails, MD;  Location: [facility] ENDOSCOPY;  Service: Endoscopy;  Laterality: N/A;  . ESOPHAGOGASTRODUODENOSCOPY (EGD) WITH PROPOFOL N/A 04/18/2017   Procedure: ESOPHAGOGASTRODUODENOSCOPY (EGD) WITH PROPOFOL;  Surgeon: Lollie Sails, MD;  Location: [facility] ENDOSCOPY;  Service: Endoscopy;  Laterality: N/A;  . EYE SURGERY Bilateral    Cataract Extraction with IOL  . HERNIA REPAIR    . INGUINAL HERNIA REPAIR Bilateral 01/23/2016   Procedure: HERNIA REPAIR INGUINAL ADULT BILATERAL;  Surgeon: Leonie Green, MD;  Location: [facility] ORS;  Service: General;  Laterality: Bilateral;  . LUMBAR LAMINECTOMY/DECOMPRESSION MICRODISCECTOMY Left 10/01/2016   Procedure: Left Lumbar four-five Laminotomy for resection of synovial cyst;  Surgeon: Erline Levine, MD;  Location: [facility];  Service: Neurosurgery;  Laterality: Left;  left  . NECK SURGERY  06/2009  . SHOULDER SURGERY  2010     A IV Location/Drains/Wounds Patient Lines/Drains/Airways Status   Active Line/Drains/Airways    Name:   Placement date:   Placement time:   Site:   Days:   Peripheral IV 07/31/18 Left Antecubital   07/31/18    1618    Antecubital   less than 1   Peripheral IV 07/31/18 Left Forearm   07/31/18    1640    Forearm   less than 1   Airway   02/03/17    1106     543    Incision (Closed) 01/23/16 Groin Bilateral   01/23/16    0752     920   Incision (Closed) 10/01/16 Back Other (Comment)   10/01/16    1219     668          Intake/Output Last 24 hours  Intake/Output Summary (Last 24 hours) at 07/31/2018 1932 Last data filed at 07/31/2018 1729 Gross per 24 hour  Intake 1000 ml  Output -  Net 1000 ml    Labs/Imaging Results for orders placed or performed during the hospital encounter of 07/31/18 (from the past 48 hour(s))  Basic metabolic panel     Status: Abnormal   Collection Time: 07/31/18  4:16 PM  Result  Value Ref Range   Sodium 136 135 - 145 mmol/L   Potassium 4.1 3.5 - 5.1 mmol/L   Chloride 94 (L) 98 - 111 mmol/L   CO2 33 (H) 22 - 32 mmol/L   Glucose, Bld 144 (H) 70 - 99 mg/dL   BUN 12 8 - 23 mg/dL   Creatinine, Ser 0.78 0.61 - 1.24 mg/dL   Calcium 9.3 8.9 - 10.3 mg/dL   GFR calc non Af Amer >60 >60 mL/min   GFR calc Af Amer >60 >60 mL/min   Anion gap 9 5 - 15    Comment: Performed at [facility], [address] [place], [place] [address]  CBC     Status: None   Collection Time: 07/31/18  4:16 PM  Result Value Ref Range   WBC 9.1 4.0 - 10.5 K/uL   RBC 4.68 4.22 - 5.81 MIL/uL   Hemoglobin 15.3 13.0 - 17.0 g/dL   HCT 45.5 39.0 - 52.0 %   MCV 97.2 80.0 - 100.0 fL   MCH 32.7 26.0 - 34.0 pg   MCHC 33.6 30.0 - 36.0 g/dL   RDW 13.2 11.5 - 15.5 %   Platelets 177 150 - 400 K/uL   nRBC 0.0 0.0 - 0.2 %    Comment: Performed at [facility], [address] [place], [place] [address]  Troponin I - ONCE - STAT     Status: None   Collection Time: 07/31/18  4:16 PM  Result Value Ref Range   Troponin I <0.03 <0.03 ng/mL    Comment: Performed at [facility], [address] [place], [place] [address]  Protime-INR     Status: Abnormal   Collection Time: 07/31/18  4:16 PM  Result Value Ref Range   Prothrombin Time 11.3 (L) 11.4 - 15.2 seconds   INR 0.8 0.8 - 1.2    Comment: (NOTE) INR goal varies based on  device and disease states. Performed at [facility], [address] [place], [place] [address]   APTT     Status: None   Collection Time: 07/31/18  4:16 PM  Result Value Ref Range   aPTT 26 24 - 36 seconds    Comment: Performed at [facility], [address] [place], [place] [address]   Dg Chest Portable 1 View  Result Date: 07/31/2018 CLINICAL DATA:  Left chest pain EXAM: PORTABLE CHEST 1 VIEW COMPARISON:  04/07/2018 FINDINGS: Prior CABG. No confluent airspace opacities or effusions. Heart is normal size. No acute bony abnormality. IMPRESSION: Prior CABG.  No active disease. Electronically Signed   By: Rolm Baptise M.D.   On: 07/31/2018 16:32   Ct Angio Chest/abd/pel For Dissection W And/or Wo Contrast  Result Date: 07/31/2018 CLINICAL DATA:  Chest pain EXAM: CT ANGIOGRAPHY CHEST, ABDOMEN AND PELVIS TECHNIQUE: Multidetector CT imaging through the chest, abdomen and pelvis was performed using the standard protocol during bolus administration of intravenous contrast. Multiplanar reconstructed images and MIPs were obtained and reviewed to evaluate the vascular anatomy. CONTRAST:  122mL ISOVUE-370 IOPAMIDOL (ISOVUE-370) INJECTION 76% COMPARISON:  CT abdomen pelvis 04/07/2018 FINDINGS: CTA CHEST FINDINGS Cardiovascular: --Heart: The heart size is normal. There is nopericardial effusion. There are coronary artery calcifications --Aorta: The course and caliber of the thoracic aorta are normal. There is mild aortic atherosclerotic calcification. Precontrast images show no aortic intramural hematoma. There is no blood pool, dissection or penetrating ulcer demonstrated on arterial phase postcontrast imaging. There is a conventional 3 vessel aortic arch branching  pattern. The proximal arch vessels are widely patent. --Pulmonary Arteries: Contrast timing is optimized for preferential opacification of the aorta. Within that limitation, normal central pulmonary arteries. Mediastinum/Nodes:  No mediastinal, hilar or axillary lymphadenopathy. The visualized thyroid and thoracic esophageal course are unremarkable. Lungs/Pleura: No pulmonary nodules or masses. Left basilar atelectasis. No pleural effusion or pneumothorax. No focal airspace consolidation. No focal pleural abnormality. Musculoskeletal: No chest wall abnormality. No acute osseous findings. Review of the MIP images confirms the above findings. CTA ABDOMEN AND PELVIS FINDINGS VASCULAR Aorta: Normal caliber aorta without aneurysm, dissection, vasculitis or hemodynamically significant stenosis. There is moderate aortic atherosclerosis. Celiac: No aneurysm, dissection or hemodynamically significant stenosis. Normal branching pattern. SMA: Widely patent without dissection or stenosis. Renals: Paired right and single left renal artery. No aneurysm, dissection, stenosis or evidence of fibromuscular dysplasia. IMA: Patent without abnormality. Inflow: No aneurysm, stenosis or dissection. Veins: Normal course and caliber of the major veins. Assessment is otherwise limited by the arterial dominant contrast phase. Review of the MIP images confirms the above findings. NON-VASCULAR Hepatobiliary: Normal hepatic contours and density. No visible biliary dilatation. Status post cholecystectomy. Pancreas: Normal contours without ductal dilatation. No peripancreatic fluid collection. Spleen: Normal arterial phase splenic enhancement pattern. Adrenals/Urinary Tract: --Adrenal glands: Normal. --Right kidney/ureter: No hydronephrosis or perinephric stranding. No nephrolithiasis. No obstructing ureteral stones. --Left kidney/ureter: No hydronephrosis or perinephric stranding. No nephrolithiasis. No obstructing ureteral stones. --Urinary bladder: Unremarkable. Stomach/Bowel: --Stomach/Duodenum: No hiatal hernia or other gastric abnormality. Normal duodenal course and caliber. --Small bowel: No dilatation or inflammation. --Colon: There is descending colonic and  rectosigmoid diverticulosis with mild fat stranding adjacent the distal descending colon (5:169). No free intraperitoneal air or intra-abdominal fluid collection. --Appendix: Normal. Lymphatic: No abdominal or pelvic lymphadenopathy. Reproductive: Normal prostate and seminal vesicles. Musculoskeletal. No bony spinal canal stenosis or focal osseous abnormality. Other: None. Review of the MIP images confirms the above findings. IMPRESSION: 1. No acute aortic syndrome. 2. Descending colonic and rectosigmoid diverticulosis with mild fat stranding adjacent to the distal descending colon, which may indicate early acute diverticulitis. 3. Coronary artery and Aortic Atherosclerosis (ICD10-I70.0). Electronically Signed   By: Ulyses Jarred M.D.   On: 07/31/2018 17:57    Pending Labs Unresulted Labs (From admission, onward)    Start     Ordered   08/01/18 0500  Lipid panel  Tomorrow morning,   STAT     07/31/18 1925   07/31/18 2300  Troponin I - Once  Once,   STAT     07/31/18 1925   07/31/18 2000  Troponin I - Once  Once,   STAT     07/31/18 1925   07/31/18 1926  Urinalysis, Routine w reflex microscopic  Once,   STAT     07/31/18 1925   Signed and Held  HIV antibody (Routine Testing)  Once,   R     Signed and Held   Signed and Held  Creatinine, serum  (enoxaparin (LOVENOX)    CrCl >/= 30 ml/min)  Weekly,   R    Comments:  while on enoxaparin therapy    Signed and Held          Vitals/Pain Today's Vitals   07/31/18 1730 07/31/18 1731 07/31/18 1815 07/31/18 1840  BP: 111/76  119/78   Pulse: (!) 53  (!) 58   Resp: 13  14   Temp:      TempSrc:      SpO2: 95%  95%   Weight:  Height:      PainSc:  9   2     Isolation Precautions No active isolations  Medications Medications  metroNIDAZOLE (FLAGYL) IVPB 500 mg (has no administration in time range)  levofloxacin (LEVAQUIN) IVPB 500 mg (has no administration in time range)  sodium chloride flush (NS) 0.9 % injection 3 mL (3 mLs  Intravenous Given 07/31/18 1700)  aspirin chewable tablet 324 mg (324 mg Oral Given 07/31/18 1627)  sodium chloride 0.9 % bolus 1,000 mL (0 mLs Intravenous Stopped 07/31/18 1729)  iopamidol (ISOVUE-370) 76 % injection 100 mL (100 mLs Intravenous Contrast Given 07/31/18 1711)  fentaNYL (SUBLIMAZE) injection 12.5 mcg (12.5 mcg Intravenous Given 07/31/18 1732)  ondansetron (ZOFRAN) injection 4 mg (4 mg Intravenous Given 07/31/18 1835)  amoxicillin-clavulanate (AUGMENTIN) 875-125 MG per tablet 1 tablet (1 tablet Oral Given 07/31/18 1839)    Mobility walks Low fall risk   Focused Assessments Cardiac Assessment Handoff:  Cardiac Rhythm: Normal sinus rhythm Lab Results  Component Value Date   TROPONINI <0.03 07/31/2018   No results found for: DDIMER Does the Patient currently have chest pain? Yes     R Recommendations: See Admitting Provider Note  Report given to:   Andria Meuse RN  Additional Notes:  Pt has listed allergy to oxycodone. Pt achieved good pain control with 12.11mcg of fentanyl.

## 2018-07-31 NOTE — H&P (Signed)
[facility] [place] NAME: Willie Clark    MR#:  [id number]  DATE OF BIRTH:  01/14/1951  DATE OF ADMISSION:  07/31/2018  PRIMARY CARE PHYSICIAN: [name], MD   REQUESTING/REFERRING PHYSICIAN: Delman Kitten  CHIEF COMPLAINT:   Chief Complaint  Patient presents with   Chest Pain    HISTORY OF PRESENT ILLNESS:  Willie Clark  is a 68 y.o. male with a known history of CAD status post bypass surgery x2 and prior stents placements who follows up with cardiologist Dr. Jenna Luo and hyperlipidemia presented to the emergency room with complaints of chest pains.  This is been going on off and on for the last 2 weeks.  Chest pain located left side of the chest radiating to the left arm.  Chest pain described as feeling of heaviness in the left chest.  No aggravating or relieving factors.  Today patient started having lower abdominal pains.  Has some nausea but no vomiting.  No diarrhea.  No fevers.  Patient has no shortness of breath.  Patient was evaluated in the emergency room and given nitroglycerin.  Transiently had a drop in blood pressure which resolved with IV fluids with most recent blood pressure of two 119/70.  Troponin level was less than 0.03.  Twelve-lead EKG with normal sinus rhythm. Patient had CT abdomen and pelvis as well as CT chest done which was negative for any acute aortic syndrome.  There were findings however suggestive of early acute diverticulitis.  PAST MEDICAL HISTORY:   Past Medical History:  Diagnosis Date   (HFpEF) heart failure with preserved ejection fraction ([place])    a. 09/2016 Echo: EF 55-60%, no rwma, triv AI, mild MR, mildly dil LA.   Anxiety    Arthritis    Barrett's esophagus    Bowel obstruction ([place]) 02/2009   small bowel   Chronic chest pain    Coronary artery disease    a. 1993 CABG; b. 2007 Redo CABG; c. 09/2015 Cath: LM mild dzs, LAD 100ost, RI mild dzs, LCX 60p, OM1 100, RCA 100ost, VG->OM2 min  irregs, LIMA->LAD nl, VG->RPDA 30ost, 20d, nl EF->Med rx.   Depression    Diverticulitis    Diverticulosis    Erectile dysfunction    Fibromyalgia    Gastric ulcer    Gastritis    GERD (gastroesophageal reflux disease)    Headache    Hemorrhoids    Hyperlipidemia    Hypertension    Kidney stones    going to [facility]  June 26 to see kidney specialist   Lightheadedness    Mass on back    MI (myocardial infarction) ([place])    Nephrolithiasis    Orthostatic hypotension    a. Improved after discontinuation of metoprolol.   OSA (obstructive sleep apnea)    a. On CPAP.   Pulmonary embolism ([place]) 01/2006   Post-op/treated   Skin lesions, generalized    Stroke [facility]) 1993   right side weakness no blood thinners   on Aspirin   Syncope and collapse    Urticaria     PAST SURGICAL HISTORY:   Past Surgical History:  Procedure Laterality Date   BACK SURGERY     bowel obstruction  01/2009   CARDIAC CATHETERIZATION  11/2006   patent grafts. Significant OM3 disease and occluded diagonals.   CARDIAC CATHETERIZATION  06/2010   patent grafts. significant ISR in proximal LCX but OM2 is bypassed and gives retrograde flow to  OM3.    CARDIAC CATHETERIZATION  06/2013   [facility]: Patent grafts with 60% proximal in-stent restenosis in the left circumflex with FFR of 0.85   CARDIAC CATHETERIZATION Left 09/25/2015   Procedure: Left Heart Cath and Cors/Grafts Angiography;  Surgeon: Wellington Hampshire, MD;  Location: [facility];  Service: Cardiovascular;  Laterality: Left;   CERVICAL FUSION     CHOLECYSTECTOMY     COLON SURGERY  01/2009   BOWEL RESECTION DUE TO SMALL BOWEL OBSTRUCTION   COLONOSCOPY     COLONOSCOPY WITH PROPOFOL N/A 10/28/2017   Procedure: COLONOSCOPY WITH PROPOFOL;  Surgeon: Lollie Sails, MD;  Location: [facility] ENDOSCOPY;  Service: Endoscopy;  Laterality: N/A;   CORONARY ANGIOPLASTY  2006   CORONARY ARTERY BYPASS GRAFT  1993/01/2006   redo  at [facility]. LIMA to LAD, SVG to OM2 and SVG to [place] Bilateral 09/24/2015   Procedure: CYSTOSCOPY WITH BILATERAL RETROGRADES, BILATERAL STENT PLACEMENT;  Surgeon: Festus Aloe, MD;  Location: [facility] ORS;  Service: Urology;  Laterality: Bilateral;   ESOPHAGOGASTRODUODENOSCOPY N/A 10/25/2014   Procedure: ESOPHAGOGASTRODUODENOSCOPY (EGD);  Surgeon: Lollie Sails, MD;  Location: [facility] ENDOSCOPY;  Service: Endoscopy;  Laterality: N/A;   ESOPHAGOGASTRODUODENOSCOPY (EGD) WITH PROPOFOL N/A 02/03/2017   Procedure: ESOPHAGOGASTRODUODENOSCOPY (EGD) WITH PROPOFOL;  Surgeon: Lollie Sails, MD;  Location: [facility] ENDOSCOPY;  Service: Endoscopy;  Laterality: N/A;   ESOPHAGOGASTRODUODENOSCOPY (EGD) WITH PROPOFOL N/A 04/18/2017   Procedure: ESOPHAGOGASTRODUODENOSCOPY (EGD) WITH PROPOFOL;  Surgeon: Lollie Sails, MD;  Location: [facility] ENDOSCOPY;  Service: Endoscopy;  Laterality: N/A;   EYE SURGERY Bilateral    Cataract Extraction with IOL   HERNIA REPAIR     INGUINAL HERNIA REPAIR Bilateral 01/23/2016   Procedure: HERNIA REPAIR INGUINAL ADULT BILATERAL;  Surgeon: Leonie Green, MD;  Location: [facility] ORS;  Service: General;  Laterality: Bilateral;   LUMBAR LAMINECTOMY/DECOMPRESSION MICRODISCECTOMY Left 10/01/2016   Procedure: Left Lumbar four-five Laminotomy for resection of synovial cyst;  Surgeon: Erline Levine, MD;  Location: [place];  Service: Neurosurgery;  Laterality: Left;  left   NECK SURGERY  06/2009   SHOULDER SURGERY  2010    SOCIAL HISTORY:   Social History   Tobacco Use   Smoking status: Former Smoker    Packs/day: 2.50    Types: Cigarettes    Last attempt to quit: 08/21/1991    Years since quitting: 26.9   Smokeless tobacco: Never Used  Substance Use Topics   Alcohol use: Not Currently    FAMILY HISTORY:   Family History  Problem Relation Age of Onset   Heart disease Father 81   Heart attack Father     DRUG ALLERGIES:   Allergies   Allergen Reactions   Amitriptyline     Pt unsure of what happens   Systems developer Diarrhea   Cyclobenzaprine Other (See Comments)    Pt unsure what hapens   Fish Oil Itching   Lidocaine Nausea And Vomiting    Nasal Spray (only time had reaction)   Other Diarrhea    Reactive agents: Onions, bacon, steak   Percocet [Oxycodone-Acetaminophen] Other (See Comments)    Lowers BP    REVIEW OF SYSTEMS:   Review of Systems  Constitutional: Negative for chills and fever.  HENT: Negative for hearing loss and tinnitus.   Eyes: Negative for blurred vision and double vision.  Respiratory: Negative for cough, hemoptysis and shortness of breath.   Cardiovascular: Positive for chest pain. Negative for palpitations.  Gastrointestinal: Positive for  abdominal pain and nausea. Negative for diarrhea, heartburn and vomiting.  Genitourinary: Negative for dysuria and urgency.  Musculoskeletal: Negative for myalgias and neck pain.  Skin: Negative for itching and rash.  Neurological: Negative for dizziness and headaches.  Psychiatric/Behavioral: Negative for depression and substance abuse.    MEDICATIONS AT HOME:   Prior to Admission medications   Medication Sig Start Date End Date Taking? Authorizing Provider  acetaminophen (TYLENOL) 500 MG tablet Take 500 mg by mouth every 8 (eight) hours as needed for mild pain.  06/09/12  Yes [provider]  aspirin EC 81 MG tablet Take 81 mg by mouth daily.   Yes [provider]  citalopram (CELEXA) 20 MG tablet Take 20 mg by mouth daily.    Yes [provider]  docusate sodium (COLACE) 100 MG capsule Take 100 mg by mouth 2 (two) times daily.   Yes [provider]  fexofenadine (ALLEGRA) 180 MG tablet Take 180 mg by mouth daily.   Yes [provider]  FIBER PO Take 1 tablet by mouth daily.    Yes [provider]  finasteride (PROSCAR) 5 MG tablet Take 5 mg by mouth daily.   Yes [provider]    furosemide (LASIX) 40 MG tablet Take 40 mg by mouth daily as needed for fluid.   Yes [provider]  gabapentin (NEURONTIN) 800 MG tablet Take 800 mg by mouth 3 (three) times daily.   Yes [provider]  HYDROcodone-acetaminophen (NORCO/VICODIN) 5-325 MG tablet Take 1 tablet by mouth every 6 (six) hours as needed. 07/29/18  Yes [provider]  indapamide (LOZOL) 2.5 MG tablet Take 2.5 mg by mouth daily. 10/17/17  Yes [provider]  Methylcellulose, Laxative, 500 MG TABS Take 1 tablet by mouth daily.   Yes [provider]  Multiple Vitamins-Minerals (HAIR/SKIN/NAILS PO) Take 1 tablet by mouth 3 (three) times daily.    Yes [provider]  pantoprazole (PROTONIX) 40 MG tablet Take 40 mg by mouth 2 (two) times daily.    Yes [provider]  potassium chloride SA (K-DUR,KLOR-CON) 20 MEQ tablet Take 20 mEq by mouth daily.   Yes [provider]  psyllium (REGULOID) 0.52 g capsule Take 0.52 g by mouth daily.   Yes [provider]  rosuvastatin (CRESTOR) 20 MG tablet Take 20 mg by mouth at bedtime.    Yes [provider]  triamcinolone ointment (KENALOG) 0.1 % Apply 1 application topically 2 (two) times daily.  01/01/16  Yes [provider]  vitamin B-12 (CYANOCOBALAMIN) 1000 MCG tablet Take 1,000 mcg by mouth daily.   Yes [provider]  albuterol (PROVENTIL HFA;VENTOLIN HFA) 108 (90 Base) MCG/ACT inhaler Inhale into the lungs every 4 (four) hours as needed for wheezing or shortness of breath.    [provider]  nitroGLYCERIN (NITROSTAT) 0.4 MG SL tablet Place 0.4 mg under the tongue every 5 (five) minutes as needed for chest pain.     [provider]  Spacer/Aero-Holding Chambers (AEROCHAMBER MINI CHAMBER) DEVI by Does not apply route as directed.    [provider]      VITAL SIGNS:  Blood pressure 119/78, pulse (!) 58, temperature 97.9 F (36.6 C), temperature  source Oral, resp. rate 14, height 5\' 9"  (1.753 m), weight 85.7 kg, SpO2 95 %.  PHYSICAL EXAMINATION:  Physical Exam  GENERAL:  68 y.o.-year-old patient lying in the bed with no acute distress.  EYES: Pupils equal, round, reactive to light and  accommodation. No scleral icterus. Extraocular muscles intact.  HEENT: Head atraumatic, normocephalic. Oropharynx and nasopharynx clear.  NECK:  Supple, no jugular venous distention. No thyroid enlargement, no tenderness.  LUNGS: Normal breath sounds bilaterally, no wheezing, rales,rhonchi or crepitation. No use of accessory muscles of respiration.  CARDIOVASCULAR: S1, S2 normal. No murmurs, rubs, or gallops.  ABDOMEN: Soft, nontender, mild suprapubic tenderness.  No rebound or guarding.  Bowel sounds present. No organomegaly or mass.  EXTREMITIES: No pedal edema, cyanosis, or clubbing.  NEUROLOGIC: Cranial nerves II through XII are intact. Muscle strength 5/5 in all extremities. Sensation intact. Gait not checked.  PSYCHIATRIC: The patient is alert and oriented x 3.  SKIN: No obvious rash, lesion, or ulcer.   LABORATORY PANEL:   CBC Recent Labs  Lab 07/31/18 1616  WBC 9.1  HGB 15.3  HCT 45.5  PLT 177   ------------------------------------------------------------------------------------------------------------------  Chemistries  Recent Labs  Lab 07/31/18 1616  NA 136  K 4.1  CL 94*  CO2 33*  GLUCOSE 144*  BUN 12  CREATININE 0.78  CALCIUM 9.3   ------------------------------------------------------------------------------------------------------------------  Cardiac Enzymes Recent Labs  Lab 07/31/18 1616  TROPONINI <0.03   ------------------------------------------------------------------------------------------------------------------  RADIOLOGY:  Dg Chest Portable 1 View  Result Date: 07/31/2018 CLINICAL DATA:  Left chest pain EXAM: PORTABLE CHEST 1 VIEW COMPARISON:  04/07/2018 FINDINGS: Prior CABG. No confluent  airspace opacities or effusions. Heart is normal size. No acute bony abnormality. IMPRESSION: Prior CABG.  No active disease. Electronically Signed   By: Rolm Baptise M.D.   On: 07/31/2018 16:32   Ct Angio Chest/abd/pel For Dissection W And/or Wo Contrast  Result Date: 07/31/2018 CLINICAL DATA:  Chest pain EXAM: CT ANGIOGRAPHY CHEST, ABDOMEN AND PELVIS TECHNIQUE: Multidetector CT imaging through the chest, abdomen and pelvis was performed using the standard protocol during bolus administration of intravenous contrast. Multiplanar reconstructed images and MIPs were obtained and reviewed to evaluate the vascular anatomy. CONTRAST:  154mL ISOVUE-370 IOPAMIDOL (ISOVUE-370) INJECTION 76% COMPARISON:  CT abdomen pelvis 04/07/2018 FINDINGS: CTA CHEST FINDINGS Cardiovascular: --Heart: The heart size is normal. There is nopericardial effusion. There are coronary artery calcifications --Aorta: The course and caliber of the thoracic aorta are normal. There is mild aortic atherosclerotic calcification. Precontrast images show no aortic intramural hematoma. There is no blood pool, dissection or penetrating ulcer demonstrated on arterial phase postcontrast imaging. There is a conventional 3 vessel aortic arch branching pattern. The proximal arch vessels are widely patent. --Pulmonary Arteries: Contrast timing is optimized for preferential opacification of the aorta. Within that limitation, normal central pulmonary arteries. Mediastinum/Nodes: No mediastinal, hilar or axillary lymphadenopathy. The visualized thyroid and thoracic esophageal course are unremarkable. Lungs/Pleura: No pulmonary nodules or masses. Left basilar atelectasis. No pleural effusion or pneumothorax. No focal airspace consolidation. No focal pleural abnormality. Musculoskeletal: No chest wall abnormality. No acute osseous findings. Review of the MIP images confirms the above findings. CTA ABDOMEN AND PELVIS FINDINGS VASCULAR Aorta: Normal caliber aorta  without aneurysm, dissection, vasculitis or hemodynamically significant stenosis. There is moderate aortic atherosclerosis. Celiac: No aneurysm, dissection or hemodynamically significant stenosis. Normal branching pattern. SMA: Widely patent without dissection or stenosis. Renals: Paired right and single left renal artery. No aneurysm, dissection, stenosis or evidence of fibromuscular dysplasia. IMA: Patent without abnormality. Inflow: No aneurysm, stenosis or dissection. Veins: Normal course and caliber of the major veins. Assessment is otherwise limited by the arterial dominant contrast phase. Review of the MIP images confirms the above findings. NON-VASCULAR Hepatobiliary: Normal hepatic contours and density. No  visible biliary dilatation. Status post cholecystectomy. Pancreas: Normal contours without ductal dilatation. No peripancreatic fluid collection. Spleen: Normal arterial phase splenic enhancement pattern. Adrenals/Urinary Tract: --Adrenal glands: Normal. --Right kidney/ureter: No hydronephrosis or perinephric stranding. No nephrolithiasis. No obstructing ureteral stones. --Left kidney/ureter: No hydronephrosis or perinephric stranding. No nephrolithiasis. No obstructing ureteral stones. --Urinary bladder: Unremarkable. Stomach/Bowel: --Stomach/Duodenum: No hiatal hernia or other gastric abnormality. Normal duodenal course and caliber. --Small bowel: No dilatation or inflammation. --Colon: There is descending colonic and rectosigmoid diverticulosis with mild fat stranding adjacent the distal descending colon (5:169). No free intraperitoneal air or intra-abdominal fluid collection. --Appendix: Normal. Lymphatic: No abdominal or pelvic lymphadenopathy. Reproductive: Normal prostate and seminal vesicles. Musculoskeletal. No bony spinal canal stenosis or focal osseous abnormality. Other: None. Review of the MIP images confirms the above findings. IMPRESSION: 1. No acute aortic syndrome. 2. Descending colonic  and rectosigmoid diverticulosis with mild fat stranding adjacent to the distal descending colon, which may indicate early acute diverticulitis. 3. Coronary artery and Aortic Atherosclerosis (ICD10-I70.0). Electronically Signed   By: Ulyses Jarred M.D.   On: 07/31/2018 17:57      IMPRESSION AND PLAN:  Patient is a [age]-year-old with known history of CAD status post bypass surgery x2 and prior stents placements who follows up with cardiologist Dr. Jenna Luo and hyperlipidemia presented to the emergency room with complaints of chest pains abdominal pains.  Found to have evidence of early diverticulitis on CT abdomen.  Being admitted for further evaluation  1.  Chest pain Patient with significant cardiac history including CAD status post bypass surgery x2 and prior stents placement Initial troponin negative.  Twelve-lead EKG with normal sinus rhythm. Follow-up serial cardiac enzymes.  Lipid panel in a.m. Requested for cardiologist consult.  Message sent to cardiologist on call via epic.  2.  Acute early diverticulitis  placed on IV levofloxacin and Flagyl. IV fluid hydration.  Monitor clinically  DVT prophylaxis; Lovenox   All the records are reviewed and case discussed with ED provider. Management plans discussed with the patient, family and they are in agreement.  CODE STATUS: Full code  TOTAL TIME TAKING CARE OF THIS PATIENT: 47 minutes.    Willie Clark M.D on 07/31/2018 at 8:06 PM  Between 7am to 6pm - Pager - [phone]  After 6pm go to [email or web address] - Proofreader  [facility]  Office  [phone]  CC: Primary care physician; [name], MD   Note: This dictation was prepared with Dragon dictation along with smaller phrase technology. Any transcriptional errors that result from this process are unintentional.

## 2018-07-31 NOTE — ED Notes (Signed)
Pt stated he felt nauseated after taking 0.4 nitro tablet sublingual. Pts became unresponsive, BP dropped to 54/40. Dr Jacqualine Code notified. Pt initially unresponsive to pain. Placed in trendelenberg, 10L O2 via non-rebreather, 1041mL NS bolus. Pt regained consciousness and A&Ox4. Pt reports feeling nausea.

## 2018-07-31 NOTE — ED Provider Notes (Signed)
[facility] Emergency Department Provider Note   ____________________________________________   First MD Initiated Contact with Patient 07/31/18 1615     (approximate)  I have reviewed the triage vital signs and the nursing notes.   HISTORY  Chief Complaint Chest Pain    HPI Willie Clark is a 68 y.o. male history of hypertension, coronary disease, previous cardiac bypass, MI  Patient presents today states he had about 1 week of some left-sided chest discomfort left upper chest.  This morning the pain notably worsened.  He is a heavy feeling this radiating down his left upper arm.  Nauseated at times, no vomiting or shortness of breath.  Pain is described as a heaviness mostly over the left upper side of the chest.  This morning he also noticed it felt like the pain had moved a little bit he is having some pain in his abdominal region on the left as well.  No fevers chills or recent illness.  Denies any exposure to coronavirus  Has nitroglycerin, but is not yet taken it  Took one baby aspirin today  Past Medical History:  Diagnosis Date   (HFpEF) heart failure with preserved ejection fraction ([place])    a. 09/2016 Echo: EF 55-60%, no rwma, triv AI, mild MR, mildly dil LA.   Anxiety    Arthritis    Barrett's esophagus    Bowel obstruction ([place]) 02/2009   small bowel   Chronic chest pain    Coronary artery disease    a. 1993 CABG; b. 2007 Redo CABG; c. 09/2015 Cath: LM mild dzs, LAD 100ost, RI mild dzs, LCX 60p, OM1 100, RCA 100ost, VG->OM2 min irregs, LIMA->LAD nl, VG->RPDA 30ost, 20d, nl EF->Med rx.   Depression    Diverticulitis    Diverticulosis    Erectile dysfunction    Fibromyalgia    Gastric ulcer    Gastritis    GERD (gastroesophageal reflux disease)    Headache    Hemorrhoids    Hyperlipidemia    Hypertension    Kidney stones    going to [facility]  June 26 to see kidney specialist   Lightheadedness    Mass  on back    MI (myocardial infarction) ([place])    Nephrolithiasis    Orthostatic hypotension    a. Improved after discontinuation of metoprolol.   OSA (obstructive sleep apnea)    a. On CPAP.   Pulmonary embolism ([place]) 01/2006   Post-op/treated   Skin lesions, generalized    Stroke [facility]) 1993   right side weakness no blood thinners   on Aspirin   Syncope and collapse    Urticaria     Patient Active Problem List   Diagnosis Date Noted   Synovial cyst of lumbar facet joint 10/01/2016   Kidney stones    Ureteral obstruction    Obstructive uropathy 09/24/2015   Abnormal stress test    Atherosclerosis of coronary artery bypass graft of native heart    Chronic combined systolic and diastolic congestive heart failure [facility])    Essential hypertension    Preoperative cardiovascular examination    Bilateral leg edema 01/10/2015   TIA (transient ischemic attack) 08/13/2014   Intermittent claudication ([place]) 06/07/2013   Abdominal pain 05/16/2012   Dyspnea 11/21/2011   Sinus bradycardia 07/12/2010   ED (erectile dysfunction) 07/12/2010   Coronary artery disease    Hypertension    Hyperlipidemia     Past Surgical History:  Procedure Laterality Date   BACK SURGERY  bowel obstruction  01/2009   CARDIAC CATHETERIZATION  11/2006   patent grafts. Significant OM3 disease and occluded diagonals.   CARDIAC CATHETERIZATION  06/2010   patent grafts. significant ISR in proximal LCX but OM2 is bypassed and gives retrograde flow to OM3.    CARDIAC CATHETERIZATION  06/2013   [facility]: Patent grafts with 60% proximal in-stent restenosis in the left circumflex with FFR of 0.85   CARDIAC CATHETERIZATION Left 09/25/2015   Procedure: Left Heart Cath and Cors/Grafts Angiography;  Surgeon: Wellington Hampshire, MD;  Location: [place] CV LAB;  Service: Cardiovascular;  Laterality: Left;   CERVICAL FUSION     CHOLECYSTECTOMY     COLON SURGERY  01/2009   BOWEL RESECTION  DUE TO SMALL BOWEL OBSTRUCTION   COLONOSCOPY     COLONOSCOPY WITH PROPOFOL N/A 10/28/2017   Procedure: COLONOSCOPY WITH PROPOFOL;  Surgeon: Lollie Sails, MD;  Location: [facility] ENDOSCOPY;  Service: Endoscopy;  Laterality: N/A;   CORONARY ANGIOPLASTY  2006   CORONARY ARTERY BYPASS GRAFT  1993/01/2006   redo at [facility]. LIMA to LAD, SVG to OM2 and SVG to [place] Bilateral 09/24/2015   Procedure: CYSTOSCOPY WITH BILATERAL RETROGRADES, BILATERAL STENT PLACEMENT;  Surgeon: Festus Aloe, MD;  Location: [facility] ORS;  Service: Urology;  Laterality: Bilateral;   ESOPHAGOGASTRODUODENOSCOPY N/A 10/25/2014   Procedure: ESOPHAGOGASTRODUODENOSCOPY (EGD);  Surgeon: Lollie Sails, MD;  Location: [facility] ENDOSCOPY;  Service: Endoscopy;  Laterality: N/A;   ESOPHAGOGASTRODUODENOSCOPY (EGD) WITH PROPOFOL N/A 02/03/2017   Procedure: ESOPHAGOGASTRODUODENOSCOPY (EGD) WITH PROPOFOL;  Surgeon: Lollie Sails, MD;  Location: [facility] ENDOSCOPY;  Service: Endoscopy;  Laterality: N/A;   ESOPHAGOGASTRODUODENOSCOPY (EGD) WITH PROPOFOL N/A 04/18/2017   Procedure: ESOPHAGOGASTRODUODENOSCOPY (EGD) WITH PROPOFOL;  Surgeon: Lollie Sails, MD;  Location: [facility] ENDOSCOPY;  Service: Endoscopy;  Laterality: N/A;   EYE SURGERY Bilateral    Cataract Extraction with IOL   HERNIA REPAIR     INGUINAL HERNIA REPAIR Bilateral 01/23/2016   Procedure: HERNIA REPAIR INGUINAL ADULT BILATERAL;  Surgeon: Leonie Green, MD;  Location: [facility] ORS;  Service: General;  Laterality: Bilateral;   LUMBAR LAMINECTOMY/DECOMPRESSION MICRODISCECTOMY Left 10/01/2016   Procedure: Left Lumbar four-five Laminotomy for resection of synovial cyst;  Surgeon: Erline Levine, MD;  Location: [place];  Service: Neurosurgery;  Laterality: Left;  left   NECK SURGERY  06/2009   SHOULDER SURGERY  2010    Prior to Admission medications   Medication Sig Start Date End Date Taking? Authorizing Provider  acetaminophen (TYLENOL)  500 MG tablet Take 500 mg by mouth every 8 (eight) hours as needed for mild pain.  06/09/12   [provider]  albuterol (PROVENTIL HFA;VENTOLIN HFA) 108 (90 Base) MCG/ACT inhaler Inhale into the lungs every 4 (four) hours as needed for wheezing or shortness of breath.    [provider]  aspirin EC 81 MG tablet Take 81 mg by mouth daily.    [provider]  citalopram (CELEXA) 20 MG tablet Take 20 mg by mouth daily.     [provider]  docusate sodium (COLACE) 100 MG capsule Take 100 mg by mouth 2 (two) times daily.    [provider]  enalapril (VASOTEC) 5 MG tablet Take 5 mg by mouth daily. 12/11/17   [provider]  fexofenadine (ALLEGRA) 180 MG tablet Take 180 mg by mouth daily.    [provider]  FIBER PO Take 1 tablet by mouth daily.     [provider]  finasteride (PROSCAR) 5 MG tablet Take 5 mg by mouth daily.    [provider]  gabapentin (NEURONTIN) 800 MG tablet Take 800 mg by mouth 3 (three) times daily.    [provider]  indapamide (LOZOL) 2.5 MG tablet Take 2.5 mg by mouth daily. 10/17/17   [provider]  Multiple Vitamins-Minerals (HAIR/SKIN/NAILS PO) Take 1 tablet by mouth 3 (three) times daily.     [provider]  nitroGLYCERIN (NITROSTAT) 0.4 MG SL tablet Place 0.4 mg under the tongue every 5 (five) minutes as needed for chest pain.     [provider]  nortriptyline (PAMELOR) 25 MG capsule Take 25 mg by mouth at bedtime.  07/25/14   [provider]  pantoprazole (PROTONIX) 40 MG tablet Take 40 mg by mouth daily.     [provider]  potassium chloride SA (K-DUR,KLOR-CON) 20 MEQ tablet Take 20 mEq by mouth daily.    [provider]  rosuvastatin (CRESTOR) 20 MG tablet Take 20 mg by mouth at bedtime.     [provider]  Spacer/Aero-Holding Chambers (AEROCHAMBER MINI CHAMBER) DEVI by Does not apply route as directed.     [provider]  sucralfate (CARAFATE) 1 G tablet Take 1 g by mouth 4 (four) times daily.  06/09/12   [provider]  tamsulosin (FLOMAX) 0.4 MG CAPS capsule Take 0.4 mg by mouth daily.    [provider]  triamcinolone ointment (KENALOG) 0.1 % Apply topically. 01/01/16   [provider]  vitamin B-12 (CYANOCOBALAMIN) 1000 MCG tablet Take 1,000 mcg by mouth daily.    [provider]    Allergies Amitriptyline; Berniece Salines flavor; Cyclobenzaprine; Fish oil; Lidocaine; Other; and Percocet [oxycodone-acetaminophen]  Family History  Problem Relation Age of Onset   Heart disease Father 3   Heart attack Father     Social History Social History   Tobacco Use   Smoking status: Former Smoker    Packs/day: 2.50    Types: Cigarettes    Last attempt to quit: 08/21/1991    Years since quitting: 26.9   Smokeless tobacco: Never Used  Substance Use Topics   Alcohol use: Not Currently   Drug use: No    Review of Systems Constitutional: No fever/chills Eyes: No visual changes. ENT: No sore throat. Cardiovascular: See HPI Respiratory: Denies shortness of breath. Gastrointestinal: See HPI genitourinary: Negative for dysuria. Musculoskeletal: Negative for back pain. Skin: Negative for rash. Neurological: Negative for headaches, areas of focal weakness or numbness.    ____________________________________________   PHYSICAL EXAM:  VITAL SIGNS: ED Triage Vitals  Enc Vitals Group     BP 07/31/18 1556 (!) 152/93     Pulse Rate 07/31/18 1556 65     Resp 07/31/18 1556 16     Temp 07/31/18 1556 97.9 F (36.6 C)     Temp Source 07/31/18 1556 Oral     SpO2 07/31/18 1556 96 %     Weight 07/31/18 1554 189 lb (85.7 kg)     Height 07/31/18 1554 5\' 9"  (1.753 m)     Head Circumference --      Peak Flow --      Pain Score 07/31/18 1554 10     Pain Loc --      Pain Edu? --      Excl. in [place]? --     Constitutional: Alert and oriented.  Appears in  moderate discomfort holding his hand over his left upper chest.  Wincing somewhat. Eyes:  Conjunctivae are normal. Head: Atraumatic. Nose: No congestion/rhinnorhea. Mouth/Throat: Mucous membranes are moist. Neck: No stridor.  Cardiovascular: Normal rate, regular rhythm. Grossly normal heart sounds.  Good peripheral circulation. Respiratory: Normal respiratory effort.  No retractions. Lungs CTAB. Gastrointestinal: Soft and nontender. No distention. Musculoskeletal: No lower extremity tenderness nor edema. Neurologic:  Normal speech and language. No gross focal neurologic deficits are appreciated.  Skin:  Skin is warm, dry and intact. No rash noted. Psychiatric: Mood and affect are normal. Speech and behavior are normal.  ____________________________________________   LABS (all labs ordered are listed, but only abnormal results are displayed)  Labs Reviewed  BASIC METABOLIC PANEL - Abnormal; Notable for the following components:      Result Value   Chloride 94 (*)    CO2 33 (*)    Glucose, Bld 144 (*)    All other components within normal limits  PROTIME-INR - Abnormal; Notable for the following components:   Prothrombin Time 11.3 (*)    All other components within normal limits  CBC  TROPONIN I  APTT   ____________________________________________  EKG  Initial EKG reviewed interpreted by me at 1555 Heart rate 70 QRS 100 QTc 440 Normal sinus rhythm with a nonspecific T wave abnormality  Repeat EKG performed at 1606 as the patient continues to have ongoing chest pain  Heart rate 60 QRS 100 QTc 430 Normal sinus rhythm, nonspecific T wave abnormality again seen but no progression.  No ST elevation ____________________________________________  RADIOLOGY  Dg Chest Portable 1 View  Result Date: 07/31/2018 CLINICAL DATA:  Left chest pain EXAM: PORTABLE CHEST 1 VIEW COMPARISON:  04/07/2018 FINDINGS: Prior CABG. No confluent airspace opacities or effusions. Heart is normal  size. No acute bony abnormality. IMPRESSION: Prior CABG.  No active disease. Electronically Signed   By: Rolm Baptise M.D.   On: 07/31/2018 16:32   Ct Angio Chest/abd/pel For Dissection W And/or Wo Contrast  Result Date: 07/31/2018 CLINICAL DATA:  Chest pain EXAM: CT ANGIOGRAPHY CHEST, ABDOMEN AND PELVIS TECHNIQUE: Multidetector CT imaging through the chest, abdomen and pelvis was performed using the standard protocol during bolus administration of intravenous contrast. Multiplanar reconstructed images and MIPs were obtained and reviewed to evaluate the vascular anatomy. CONTRAST:  142mL ISOVUE-370 IOPAMIDOL (ISOVUE-370) INJECTION 76% COMPARISON:  CT abdomen pelvis 04/07/2018 FINDINGS: CTA CHEST FINDINGS Cardiovascular: --Heart: The heart size is normal. There is nopericardial effusion. There are coronary artery calcifications --Aorta: The course and caliber of the thoracic aorta are normal. There is mild aortic atherosclerotic calcification. Precontrast images show no aortic intramural hematoma. There is no blood pool, dissection or penetrating ulcer demonstrated on arterial phase postcontrast imaging. There is a conventional 3 vessel aortic arch branching pattern. The proximal arch vessels are widely patent. --Pulmonary Arteries: Contrast timing is optimized for preferential opacification of the aorta. Within that limitation, normal central pulmonary arteries. Mediastinum/Nodes: No mediastinal, hilar or axillary lymphadenopathy. The visualized thyroid and thoracic esophageal course are unremarkable. Lungs/Pleura: No pulmonary nodules or masses. Left basilar atelectasis. No pleural effusion or pneumothorax. No focal airspace consolidation. No focal pleural abnormality. Musculoskeletal: No chest wall abnormality. No acute osseous findings. Review of the MIP images confirms the above findings. CTA ABDOMEN AND PELVIS FINDINGS VASCULAR Aorta: Normal caliber aorta without aneurysm, dissection, vasculitis or  hemodynamically significant stenosis. There is moderate aortic atherosclerosis. Celiac: No aneurysm, dissection or hemodynamically significant stenosis. Normal branching pattern. SMA: Widely patent without dissection or stenosis. Renals: Paired right and single left renal artery. No aneurysm, dissection, stenosis  or evidence of fibromuscular dysplasia. IMA: Patent without abnormality. Inflow: No aneurysm, stenosis or dissection. Veins: Normal course and caliber of the major veins. Assessment is otherwise limited by the arterial dominant contrast phase. Review of the MIP images confirms the above findings. NON-VASCULAR Hepatobiliary: Normal hepatic contours and density. No visible biliary dilatation. Status post cholecystectomy. Pancreas: Normal contours without ductal dilatation. No peripancreatic fluid collection. Spleen: Normal arterial phase splenic enhancement pattern. Adrenals/Urinary Tract: --Adrenal glands: Normal. --Right kidney/ureter: No hydronephrosis or perinephric stranding. No nephrolithiasis. No obstructing ureteral stones. --Left kidney/ureter: No hydronephrosis or perinephric stranding. No nephrolithiasis. No obstructing ureteral stones. --Urinary bladder: Unremarkable. Stomach/Bowel: --Stomach/Duodenum: No hiatal hernia or other gastric abnormality. Normal duodenal course and caliber. --Small bowel: No dilatation or inflammation. --Colon: There is descending colonic and rectosigmoid diverticulosis with mild fat stranding adjacent the distal descending colon (5:169). No free intraperitoneal air or intra-abdominal fluid collection. --Appendix: Normal. Lymphatic: No abdominal or pelvic lymphadenopathy. Reproductive: Normal prostate and seminal vesicles. Musculoskeletal. No bony spinal canal stenosis or focal osseous abnormality. Other: None. Review of the MIP images confirms the above findings. IMPRESSION: 1. No acute aortic syndrome. 2. Descending colonic and rectosigmoid diverticulosis with mild fat  stranding adjacent to the distal descending colon, which may indicate early acute diverticulitis. 3. Coronary artery and Aortic Atherosclerosis (ICD10-I70.0). Electronically Signed   By: Ulyses Jarred M.D.   On: 07/31/2018 17:57    CT scan reviewed negative for aortic pathology.  Questionable early diverticulitis, the patient does report he started have some abdominal discomfort today. ____________________________________________   PROCEDURES  Procedure(s) performed: None  Procedures  Critical Care performed: No  ____________________________________________   INITIAL IMPRESSION / ASSESSMENT AND PLAN / ED COURSE  Pertinent labs & imaging results that were available during my care of the patient were reviewed by me and considered in my medical decision making (see chart for details).   Differential diagnosis includes, but is not limited to, ACS, aortic dissection, pulmonary embolism, cardiac tamponade, pneumothorax, pneumonia, pericarditis, myocarditis, GI-related causes including esophagitis/gastritis, and musculoskeletal chest wall pain.    I am highly concerned about the potential for acute coronary syndrome or dissection in this patient.  He reports significant left-sided chest pain now with some radiation into his abdomen.  He has known coronary disease as felt to be at high risk for recurrent coronary disease.  Will treat initially with salicylates, nitrates, follow labs.  If still having ongoing pain plan to treat with morphine.  Await lab work and CT angiogram  Clinical Course as of Jul 30 1809  Fri Jul 31, 2018  1637 Patient took 1 oral nitro tablet, started to feel lightheaded and then had a syncopal episode.  He was immediately placed in a Trendelenburg position, placed on nonrebreather and he began to recover with an about 20 seconds.  His blood pressure is now normalized once again he is again awake and alert.  Further nitrates discontinued   [MQ]  1709 Blood pressure  improved.  Normal blood pressure.  He is resting comfortably reports he feels much better.  Further nitrates discontinued.  Does report the nitroglycerin did help his chest pain some, starting to come back slightly.   [MQ]    Clinical Course User Index [MQ] Delman Kitten, MD   Willie Clark was evaluated in Emergency Department on 07/31/2018 for the symptoms described in the history of present illness. He was evaluated in the context of the global COVID-19 pandemic, which necessitated consideration that the patient might be at  risk for infection with the SARS-CoV-2 virus that causes COVID-19. Institutional protocols and algorithms that pertain to the evaluation of patients at risk for COVID-19 are in a state of rapid change based on information released by regulatory bodies including the CDC and federal and state organizations. These policies and algorithms were followed during the patient's care in the ED.  ----------------------------------------- 6:10 PM on 07/31/2018 -----------------------------------------  Patient reports his chest pain is improved just a little bit nauseated.  Still having some slight chest pressure but better.  Discussed with the patient, will start him on Augmentin as well in case he is starting developed some early diverticulitis as he did complain of some abdominal pain starting today.  Overall though we will admit the patient, have notified Dr. Fletcher Anon of cardiology who is aware patient be admitted. ____________________________________________   FINAL CLINICAL IMPRESSION(S) / ED DIAGNOSES  Final diagnoses:  Moderate coronary artery risk chest pain        Note:  This document was prepared using Dragon voice recognition software and may include unintentional dictation errors       Delman Kitten, MD 07/31/18 1811

## 2018-08-01 ENCOUNTER — Observation Stay (HOSPITAL_BASED_OUTPATIENT_CLINIC_OR_DEPARTMENT_OTHER)
Admit: 2018-08-01 | Discharge: 2018-08-01 | Disposition: A | Discharge status: Home / Self Care | Payer: Medicare HMO | Attending: Internal Medicine | Admitting: Internal Medicine

## 2018-08-01 DIAGNOSIS — I340 Nonrheumatic mitral (valve) insufficiency: Secondary | ICD-10-CM

## 2018-08-01 DIAGNOSIS — E782 Mixed hyperlipidemia: Secondary | ICD-10-CM | POA: Diagnosis not present

## 2018-08-01 DIAGNOSIS — Z951 Presence of aortocoronary bypass graft: Secondary | ICD-10-CM

## 2018-08-01 DIAGNOSIS — I361 Nonrheumatic tricuspid (valve) insufficiency: Secondary | ICD-10-CM

## 2018-08-01 DIAGNOSIS — M5090 Cervical disc disorder, unspecified, unspecified cervical region: Secondary | ICD-10-CM

## 2018-08-01 DIAGNOSIS — I25118 Atherosclerotic heart disease of native coronary artery with other forms of angina pectoris: Secondary | ICD-10-CM | POA: Diagnosis not present

## 2018-08-01 DIAGNOSIS — R0789 Other chest pain: Secondary | ICD-10-CM

## 2018-08-01 DIAGNOSIS — K5792 Diverticulitis of intestine, part unspecified, without perforation or abscess without bleeding: Secondary | ICD-10-CM

## 2018-08-01 LAB — BASIC METABOLIC PANEL
Anion gap: 7 (ref 5–15)
BUN: 12 mg/dL (ref 8–23)
CO2: 32 mmol/L (ref 22–32)
Calcium: 8.3 mg/dL — ABNORMAL LOW (ref 8.9–10.3)
Chloride: 102 mmol/L (ref 98–111)
Creatinine, Ser: 0.81 mg/dL (ref 0.61–1.24)
GFR calc Af Amer: >60 mL/min (ref >60)
GFR calc non Af Amer: >60 mL/min (ref >60)
Glucose, Bld: 116 mg/dL — ABNORMAL HIGH (ref 70–99)
Potassium: 4.2 mmol/L (ref 3.5–5.1)
Sodium: 141 mmol/L (ref 135–145)

## 2018-08-01 LAB — CBC
HCT: 41.2 % (ref 39.0–52.0)
Hemoglobin: 13.5 g/dL (ref 13.0–17.0)
MCH: 32.5 pg (ref 26.0–34.0)
MCHC: 32.8 g/dL (ref 30.0–36.0)
MCV: 99 fL (ref 80.0–100.0)
Platelets: 145 10*3/uL — ABNORMAL LOW (ref 150–400)
RBC: 4.16 MIL/uL — ABNORMAL LOW (ref 4.22–5.81)
RDW: 13.2 % (ref 11.5–15.5)
WBC: 6.7 10*3/uL (ref 4.0–10.5)
nRBC: 0 % (ref 0.0–0.2)

## 2018-08-01 LAB — URINALYSIS, ROUTINE W REFLEX MICROSCOPIC
Bilirubin Urine: NEGATIVE
Glucose, UA: NEGATIVE mg/dL
Hgb urine dipstick: NEGATIVE
Ketones, ur: NEGATIVE mg/dL
Leukocytes,Ua: NEGATIVE
Nitrite: NEGATIVE
Protein, ur: NEGATIVE mg/dL
Specific Gravity, Urine: 1.01 (ref 1.005–1.030)
pH: 7 (ref 5.0–8.0)

## 2018-08-01 LAB — ECHOCARDIOGRAM COMPLETE
Height: 69 in
Weight: 3142.4 oz

## 2018-08-01 LAB — MAGNESIUM: Magnesium: 2 mg/dL (ref 1.7–2.4)

## 2018-08-01 LAB — LIPID PANEL
Cholesterol: 154 mg/dL (ref 0–200)
HDL: 41 mg/dL (ref >40)
LDL Cholesterol: 56 mg/dL (ref 0–99)
Total CHOL/HDL Ratio: 3.8 RATIO
Triglycerides: 285 mg/dL — ABNORMAL HIGH (ref <150)
VLDL: 57 mg/dL — ABNORMAL HIGH (ref 0–40)

## 2018-08-01 MED ORDER — METHOCARBAMOL 500 MG PO TABS
500.0000 mg | ORAL_TABLET | Freq: Two times a day (BID) | ORAL | Status: DC | PRN
  Administered 2018-08-01: 500 mg via ORAL
  Filled 2018-08-01 (×2): qty 1

## 2018-08-01 MED ORDER — KETOROLAC TROMETHAMINE 15 MG/ML IJ SOLN
15.0000 mg | Freq: Four times a day (QID) | INTRAMUSCULAR | Status: DC | PRN
  Administered 2018-08-01 – 2018-08-02 (×2): 15 mg via INTRAVENOUS
  Filled 2018-08-01 (×2): qty 1

## 2018-08-01 MED ORDER — SODIUM CHLORIDE 0.9 % IV SOLN
INTRAVENOUS | Status: DC | PRN
  Administered 2018-08-01 – 2018-08-02 (×2): 250 mL via INTRAVENOUS

## 2018-08-01 MED ORDER — PERFLUTREN LIPID MICROSPHERE
1.0000 mL | INTRAVENOUS | Status: AC | PRN
  Administered 2018-08-01: 3 mL via INTRAVENOUS
  Filled 2018-08-01: qty 10

## 2018-08-01 NOTE — Progress Notes (Signed)
[facility] NAME: Willie Clark    MR#:  [id number]  [place]:  [birth year]  SUBJECTIVE:  CHIEF COMPLAINT: Patient is reporting epigastric abdominal pain associated with nausea denies any vomiting.  Denies any chest pain or shortness of breath  REVIEW OF SYSTEMS:  CONSTITUTIONAL: No fever, fatigue or weakness.  EYES: No blurred or double vision.  EARS, NOSE, AND THROAT: No tinnitus or ear pain.  RESPIRATORY: No cough, shortness of breath, wheezing or hemoptysis.  CARDIOVASCULAR: No chest pain, orthopnea, edema.  GASTROINTESTINAL: Reports epigastric abdominal pain with nausea denies any vomiting GENITOURINARY: No dysuria, hematuria.  ENDOCRINE: No polyuria, nocturia,  HEMATOLOGY: No anemia, easy bruising or bleeding SKIN: No rash or lesion. MUSCULOSKELETAL: No joint pain or arthritis.   NEUROLOGIC: No tingling, numbness, weakness.  PSYCHIATRY: No anxiety or depression.   DRUG ALLERGIES:   Allergies  Allergen Reactions  . Amitriptyline     Pt unsure of what happens  . Berniece Salines Flavor Diarrhea  . Cyclobenzaprine Other (See Comments)    Pt unsure what hapens  . Fish Oil Itching  . Lidocaine Nausea And Vomiting    Nasal Spray (only time had reaction)  . Other Diarrhea    Reactive agents: Onions, bacon, steak  . Percocet [Oxycodone-Acetaminophen] Other (See Comments)    Lowers BP    VITALS:  Blood pressure 130/78, pulse 72, temperature 99 F (37.2 C), temperature source Oral, resp. rate 18, height 5\' 9"  (1.753 m), weight 89.1 kg, SpO2 99 %.  PHYSICAL EXAMINATION:  GENERAL:  68 y.o.-year-old patient lying in the bed with no acute distress.  EYES: Pupils equal, round, reactive to light and accommodation. No scleral icterus. Extraocular muscles intact.  HEENT: Head atraumatic, normocephalic. Oropharynx and nasopharynx clear.  NECK:  Supple, no jugular venous distention. No thyroid enlargement, no tenderness.  LUNGS:  Normal breath sounds bilaterally, no wheezing, rales,rhonchi or crepitation. No use of accessory muscles of respiration.  CARDIOVASCULAR: S1, S2 normal. No murmurs, rubs, or gallops.  ABDOMEN: Soft, epigastric area is tender no rebound tenderness nondistended. Bowel sounds present.  EXTREMITIES: No pedal edema, cyanosis, or clubbing.  NEUROLOGIC: Awake, alert and oriented x3 sensation intact. Gait not checked.  PSYCHIATRIC: The patient is alert and oriented x 3.  SKIN: No obvious rash, lesion, or ulcer.    LABORATORY PANEL:   CBC Recent Labs  Lab 08/01/18 0515  WBC 6.7  HGB 13.5  HCT 41.2  PLT 145*   ------------------------------------------------------------------------------------------------------------------  Chemistries  Recent Labs  Lab 08/01/18 0515  NA 141  K 4.2  CL 102  CO2 32  GLUCOSE 116*  BUN 12  CREATININE 0.81  CALCIUM 8.3*  MG 2.0   ------------------------------------------------------------------------------------------------------------------  Cardiac Enzymes Recent Labs  Lab 07/31/18 2246  TROPONINI <0.03   ------------------------------------------------------------------------------------------------------------------  RADIOLOGY:  Dg Chest Portable 1 View  Result Date: 07/31/2018 CLINICAL DATA:  Left chest pain EXAM: PORTABLE CHEST 1 VIEW COMPARISON:  04/07/2018 FINDINGS: Prior CABG. No confluent airspace opacities or effusions. Heart is normal size. No acute bony abnormality. IMPRESSION: Prior CABG.  No active disease. Electronically Signed   By: Rolm Baptise M.D.   On: 07/31/2018 16:32   Ct Angio Chest/abd/pel For Dissection W And/or Wo Contrast  Result Date: 07/31/2018 CLINICAL DATA:  Chest pain EXAM: CT ANGIOGRAPHY CHEST, ABDOMEN AND PELVIS TECHNIQUE: Multidetector CT imaging through the chest, abdomen and pelvis was performed using the standard protocol during bolus administration of intravenous contrast. Multiplanar reconstructed  images and MIPs were obtained and reviewed to evaluate the vascular anatomy. CONTRAST:  131mL ISOVUE-370 IOPAMIDOL (ISOVUE-370) INJECTION 76% COMPARISON:  CT abdomen pelvis 04/07/2018 FINDINGS: CTA CHEST FINDINGS Cardiovascular: --Heart: The heart size is normal. There is nopericardial effusion. There are coronary artery calcifications --Aorta: The course and caliber of the thoracic aorta are normal. There is mild aortic atherosclerotic calcification. Precontrast images show no aortic intramural hematoma. There is no blood pool, dissection or penetrating ulcer demonstrated on arterial phase postcontrast imaging. There is a conventional 3 vessel aortic arch branching pattern. The proximal arch vessels are widely patent. --Pulmonary Arteries: Contrast timing is optimized for preferential opacification of the aorta. Within that limitation, normal central pulmonary arteries. Mediastinum/Nodes: No mediastinal, hilar or axillary lymphadenopathy. The visualized thyroid and thoracic esophageal course are unremarkable. Lungs/Pleura: No pulmonary nodules or masses. Left basilar atelectasis. No pleural effusion or pneumothorax. No focal airspace consolidation. No focal pleural abnormality. Musculoskeletal: No chest wall abnormality. No acute osseous findings. Review of the MIP images confirms the above findings. CTA ABDOMEN AND PELVIS FINDINGS VASCULAR Aorta: Normal caliber aorta without aneurysm, dissection, vasculitis or hemodynamically significant stenosis. There is moderate aortic atherosclerosis. Celiac: No aneurysm, dissection or hemodynamically significant stenosis. Normal branching pattern. SMA: Widely patent without dissection or stenosis. Renals: Paired right and single left renal artery. No aneurysm, dissection, stenosis or evidence of fibromuscular dysplasia. IMA: Patent without abnormality. Inflow: No aneurysm, stenosis or dissection. Veins: Normal course and caliber of the major veins. Assessment is otherwise  limited by the arterial dominant contrast phase. Review of the MIP images confirms the above findings. NON-VASCULAR Hepatobiliary: Normal hepatic contours and density. No visible biliary dilatation. Status post cholecystectomy. Pancreas: Normal contours without ductal dilatation. No peripancreatic fluid collection. Spleen: Normal arterial phase splenic enhancement pattern. Adrenals/Urinary Tract: --Adrenal glands: Normal. --Right kidney/ureter: No hydronephrosis or perinephric stranding. No nephrolithiasis. No obstructing ureteral stones. --Left kidney/ureter: No hydronephrosis or perinephric stranding. No nephrolithiasis. No obstructing ureteral stones. --Urinary bladder: Unremarkable. Stomach/Bowel: --Stomach/Duodenum: No hiatal hernia or other gastric abnormality. Normal duodenal course and caliber. --Small bowel: No dilatation or inflammation. --Colon: There is descending colonic and rectosigmoid diverticulosis with mild fat stranding adjacent the distal descending colon (5:169). No free intraperitoneal air or intra-abdominal fluid collection. --Appendix: Normal. Lymphatic: No abdominal or pelvic lymphadenopathy. Reproductive: Normal prostate and seminal vesicles. Musculoskeletal. No bony spinal canal stenosis or focal osseous abnormality. Other: None. Review of the MIP images confirms the above findings. IMPRESSION: 1. No acute aortic syndrome. 2. Descending colonic and rectosigmoid diverticulosis with mild fat stranding adjacent to the distal descending colon, which may indicate early acute diverticulitis. 3. Coronary artery and Aortic Atherosclerosis (ICD10-I70.0). Electronically Signed   By: Ulyses Jarred M.D.   On: 07/31/2018 17:57    EKG:   Orders placed or performed during the hospital encounter of 07/31/18  . EKG 12-Lead  . EKG 12-Lead  . ED EKG  . ED EKG    ASSESSMENT AND PLAN:    Patient is a [age]-year-old with known history of CAD status post bypass surgery x2 and prior stents placements  who follows up with cardiologist Dr. Jenna Luo and hyperlipidemia presented to the emergency room with complaints of chest pains abdominal pains.  Found to have evidence of early diverticulitis on CT abdomen.  Being admitted for further evaluation  1.  Acute abdominal pain secondary to early diverticulitis  on IV levofloxacin and Flagyl. IV fluid hydration.   Monitor clinically Pain management as needed  2.  Chest pain  Patient with significant cardiac history including CAD status post bypass surgery x2 and prior stents placement Acute MI ruled out with negative troponins patient is asymptomatic today LDL 56  Twelve-lead EKG with normal sinus rhythm. Requested for cardiologist consult.    Admitting physician sent message  to cardiologist on call via epic.  3.  Hyperlipidemia LDL at 56  4.  Hypertension-blood pressure stable continue current regimen  5.  Chronic neck pain and back pain continue current home medication regimen   DVT prophylaxis; Lovenox    All the records are reviewed and case discussed with Care Management/Social Workerr. Management plans discussed with the patient, he is  in agreement.  CODE STATUS: fc  TOTAL TIME TAKING CARE OF THIS PATIENT: 35 minutes.   POSSIBLE D/C IN 2 DAYS, DEPENDING ON CLINICAL CONDITION.  Note: This dictation was prepared with Dragon dictation along with smaller phrase technology. Any transcriptional errors that result from this process are unintentional.   Nicholes Mango M.D on 08/01/2018 at 4:20 PM  Between 7am to 6pm - Pager - [phone] After 6pm go to [email or web address] - password [email or web address] [facility]  Office  [phone]  CC: Primary care physician; [name], MD

## 2018-08-01 NOTE — Care Management Obs Status (Signed)
[facility] NOTIFICATION   Patient Details  Name: Willie Clark MRN: [id number] Date of Birth: [birth year]   Medicare Observation Status Notification Given:  Yes    Trenden Hazelrigg A Leeman Johnsey, RN 08/01/2018, 3:03 PM

## 2018-08-01 NOTE — Plan of Care (Signed)
  Problem: Clinical Measurements: Goal: Ability to maintain clinical measurements within normal limits will improve Outcome: Progressing   Problem: Activity: Goal: Risk for activity intolerance will decrease Outcome: Progressing   Problem: Pain Managment: Goal: General experience of comfort will improve Outcome: Progressing   Problem: Cardiac: Goal: Ability to achieve and maintain adequate cardiovascular perfusion will improve Outcome: Progressing

## 2018-08-01 NOTE — Plan of Care (Signed)
  Problem: Clinical Measurements: Goal: Ability to maintain clinical measurements within normal limits will improve Outcome: Not Progressing Note:  Platelet level is only 145 most recently. Will continue to monitor lab values. Willie Clark [facility]

## 2018-08-01 NOTE — Consult Note (Signed)
Cardiology Consultation:   Patient ID: Willie Clark MRN: [id number]; DOB: December 28, 1950  Admit date: 07/31/2018 Date of Consult: 08/01/2018  Primary Care Provider: [name], MD Primary Cardiologist: [name], MD  Physician requesting consult: Stark Jock Jude Reason for consult angina   Patient Profile:   Willie Clark is a 68 y.o. male with a hx of coronary artery disease.   coronary artery bypass graft surgery in 1993 and redo in 2007.     fibromyalgia and chronic pain.  GI symptoms in 2015 due to documented gastric ulcer.  neck surgery in 3825 without complications.  Chronic dizziness secondary to orthostatic hypotension better after stopping metoprolol Last cardiac catheterization June 2017 He presents to the hospital with chest pain   History of Present Illness:    Reports having left-sided chest pain radiating into his left shoulder into his left neck Talk to his primary care physician during the week 3 days ago and they gave him pain medication Periods of sweats and nausea Pain is worse when he takes a deep breath Symptoms started about 1 week ago Pain worse with moving Also pain in abdominal region on the left   In the emergency room given morphine for pain After given nitro dropped his blood pressure 54/40 in the emergency room Unresponsive Given high flow nasal cannula oxygen 1 L normal saline Regained consciousness, had nausea  CT scan chest 1. No acute aortic syndrome. 2. Descending colonic and rectosigmoid diverticulosis with mild fat stranding adjacent to the distal descending colon, which may indicate early acute diverticulitis. 3. Coronary artery and Aortic Atherosclerosis (ICD10-I70.0).  This morning still with abdominal pain associated with nausea no vomiting  Cardiac enzymes negative x2  Past cardiac history reviewed as below Cardiac catheterization in June, 2017 showed significant underlying three-vessel disease with patent grafts and  no change in coronary anatomy since most recent cardiac catheterization. Ejection fraction was 50-55% with normal left ventricular end-diastolic pressure.  Previous office visits for atypical chest pain  Prior stress test August 2019 small to moderate sized region of mild ischemia in the distal anterior wall and fixed apical wall defect/peri-infarct ischemia There is no change from prior stress test May 2017 and May 2018  He does have postprandial abdominal pain with negative GI work-up.  No recent weight loss.   Past Medical History:  Diagnosis Date  . (HFpEF) heart failure with preserved ejection fraction ([place])    a. 09/2016 Echo: EF 55-60%, no rwma, triv AI, mild MR, mildly dil LA.  [name] Anxiety   . Arthritis   . Barrett's esophagus   . Bowel obstruction ([place]) 02/2009   small bowel  . Chronic chest pain   . Coronary artery disease    a. 1993 CABG; b. 2007 Redo CABG; c. 09/2015 Cath: LM mild dzs, LAD 100ost, RI mild dzs, LCX 60p, OM1 100, RCA 100ost, VG->OM2 min irregs, LIMA->LAD nl, VG->RPDA 30ost, 20d, nl EF->Med rx.  . Depression   . Diverticulitis   . Diverticulosis   . Erectile dysfunction   . Fibromyalgia   . Gastric ulcer   . Gastritis   . GERD (gastroesophageal reflux disease)   . Headache   . Hemorrhoids   . Hyperlipidemia   . Hypertension   . Kidney stones    going to [facility]  June 26 to see kidney specialist  . Lightheadedness   . Mass on back   . MI (myocardial infarction) ([place])   . Nephrolithiasis   . Orthostatic hypotension  a. Improved after discontinuation of metoprolol.  . OSA (obstructive sleep apnea)    a. On CPAP.  [name] Pulmonary embolism ([place]) 01/2006   Post-op/treated  . Skin lesions, generalized   . Stroke [facility]) 1993   right side weakness no blood thinners   on Aspirin  . Syncope and collapse   . Urticaria     Past Surgical History:  Procedure Laterality Date  . BACK SURGERY    . bowel obstruction  01/2009  . CARDIAC CATHETERIZATION   11/2006   patent grafts. Significant OM3 disease and occluded diagonals.  [name] CARDIAC CATHETERIZATION  06/2010   patent grafts. significant ISR in proximal LCX but OM2 is bypassed and gives retrograde flow to OM3.   [name] CARDIAC CATHETERIZATION  06/2013   [facility]: Patent grafts with 60% proximal in-stent restenosis in the left circumflex with FFR of 0.85  . CARDIAC CATHETERIZATION Left 09/25/2015   Procedure: Left Heart Cath and Cors/Grafts Angiography;  Surgeon: Wellington Hampshire, MD;  Location: [place] [facility];  Service: Cardiovascular;  Laterality: Left;  . CERVICAL FUSION    . CHOLECYSTECTOMY    . COLON SURGERY  01/2009   BOWEL RESECTION DUE TO SMALL BOWEL OBSTRUCTION  . COLONOSCOPY    . COLONOSCOPY WITH PROPOFOL N/A 10/28/2017   Procedure: COLONOSCOPY WITH PROPOFOL;  Surgeon: Lollie Sails, MD;  Location: [facility] ENDOSCOPY;  Service: Endoscopy;  Laterality: N/A;  . CORONARY ANGIOPLASTY  2006  . CORONARY ARTERY BYPASS GRAFT  1993/01/2006   redo at [facility]. LIMA to LAD, SVG to OM2 and SVG to RPDA  . CYSTOSCOPY WITH STENT PLACEMENT Bilateral 09/24/2015   Procedure: CYSTOSCOPY WITH BILATERAL RETROGRADES, BILATERAL STENT PLACEMENT;  Surgeon: Festus Aloe, MD;  Location: [facility] ORS;  Service: Urology;  Laterality: Bilateral;  . ESOPHAGOGASTRODUODENOSCOPY N/A 10/25/2014   Procedure: ESOPHAGOGASTRODUODENOSCOPY (EGD);  Surgeon: Lollie Sails, MD;  Location: [facility] ENDOSCOPY;  Service: Endoscopy;  Laterality: N/A;  . ESOPHAGOGASTRODUODENOSCOPY (EGD) WITH PROPOFOL N/A 02/03/2017   Procedure: ESOPHAGOGASTRODUODENOSCOPY (EGD) WITH PROPOFOL;  Surgeon: Lollie Sails, MD;  Location: [facility] ENDOSCOPY;  Service: Endoscopy;  Laterality: N/A;  . ESOPHAGOGASTRODUODENOSCOPY (EGD) WITH PROPOFOL N/A 04/18/2017   Procedure: ESOPHAGOGASTRODUODENOSCOPY (EGD) WITH PROPOFOL;  Surgeon: Lollie Sails, MD;  Location: [facility] ENDOSCOPY;  Service: Endoscopy;  Laterality: N/A;  . EYE SURGERY Bilateral    Cataract Extraction  with IOL  . HERNIA REPAIR    . INGUINAL HERNIA REPAIR Bilateral 01/23/2016   Procedure: HERNIA REPAIR INGUINAL ADULT BILATERAL;  Surgeon: Leonie Green, MD;  Location: [facility] ORS;  Service: General;  Laterality: Bilateral;  . LUMBAR LAMINECTOMY/DECOMPRESSION MICRODISCECTOMY Left 10/01/2016   Procedure: Left Lumbar four-five Laminotomy for resection of synovial cyst;  Surgeon: Erline Levine, MD;  Location: [place];  Service: Neurosurgery;  Laterality: Left;  left  . NECK SURGERY  06/2009  . SHOULDER SURGERY  2010     Home Medications:  Prior to Admission medications   Medication Sig Start Date End Date Taking? Authorizing Provider  acetaminophen (TYLENOL) 500 MG tablet Take 500 mg by mouth every 8 (eight) hours as needed for mild pain.  06/09/12  Yes [provider]  aspirin EC 81 MG tablet Take 81 mg by mouth daily.   Yes [provider]  citalopram (CELEXA) 20 MG tablet Take 20 mg by mouth daily.    Yes [provider]  docusate sodium (COLACE) 100 MG capsule Take 100 mg by mouth 2 (two) times daily.   Yes [provider]  fexofenadine (  ALLEGRA) 180 MG tablet Take 180 mg by mouth daily.   Yes [provider]  FIBER PO Take 1 tablet by mouth daily.    Yes [provider]  finasteride (PROSCAR) 5 MG tablet Take 5 mg by mouth daily.   Yes [provider]  furosemide (LASIX) 40 MG tablet Take 40 mg by mouth daily as needed for fluid.   Yes [provider]  gabapentin (NEURONTIN) 800 MG tablet Take 800 mg by mouth 3 (three) times daily.   Yes [provider]  HYDROcodone-acetaminophen (NORCO/VICODIN) 5-325 MG tablet Take 1 tablet by mouth every 6 (six) hours as needed. 07/29/18  Yes [provider]  indapamide (LOZOL) 2.5 MG tablet Take 2.5 mg by mouth daily. 10/17/17  Yes [provider]  Methylcellulose, Laxative, 500 MG TABS Take 1 tablet by mouth daily.   Yes [provider]  Multiple  Vitamins-Minerals (HAIR/SKIN/NAILS PO) Take 1 tablet by mouth 3 (three) times daily.    Yes [provider]  pantoprazole (PROTONIX) 40 MG tablet Take 40 mg by mouth 2 (two) times daily.    Yes [provider]  potassium chloride SA (K-DUR,KLOR-CON) 20 MEQ tablet Take 20 mEq by mouth daily.   Yes [provider]  psyllium (REGULOID) 0.52 g capsule Take 0.52 g by mouth daily.   Yes [provider]  rosuvastatin (CRESTOR) 20 MG tablet Take 20 mg by mouth at bedtime.    Yes [provider]  triamcinolone ointment (KENALOG) 0.1 % Apply 1 application topically 2 (two) times daily.  01/01/16  Yes [provider]  vitamin B-12 (CYANOCOBALAMIN) 1000 MCG tablet Take 1,000 mcg by mouth daily.   Yes [provider]  albuterol (PROVENTIL HFA;VENTOLIN HFA) 108 (90 Base) MCG/ACT inhaler Inhale into the lungs every 4 (four) hours as needed for wheezing or shortness of breath.    [provider]  nitroGLYCERIN (NITROSTAT) 0.4 MG SL tablet Place 0.4 mg under the tongue every 5 (five) minutes as needed for chest pain.     [provider]  Spacer/Aero-Holding Chambers (AEROCHAMBER MINI CHAMBER) DEVI by Does not apply route as directed.    [provider]    Inpatient Medications: Scheduled Meds: . aspirin EC  81 mg Oral Daily  . citalopram  20 mg Oral Daily  . docusate sodium  100 mg Oral BID  . enoxaparin (LOVENOX) injection  40 mg Subcutaneous Q24H  . finasteride  5 mg Oral Daily  . gabapentin  800 mg Oral TID  . indapamide  2.5 mg Oral Daily  . loratadine  10 mg Oral Daily  . pantoprazole  40 mg Oral BID  . psyllium  1 packet Oral Daily  . rosuvastatin  20 mg Oral QHS  . triamcinolone ointment  1 application Topical BID  . vitamin B-12  1,000 mcg Oral Daily   Continuous Infusions: . sodium chloride 250 mL (08/01/18 0656)  . levofloxacin (LEVAQUIN) IV 500 mg (07/31/18 2323)  . metronidazole 500 mg (08/01/18 1403)    PRN Meds: sodium chloride, acetaminophen, albuterol, morphine injection **OR** morphine injection, ondansetron (ZOFRAN) IV  Allergies:    Allergies  Allergen Reactions  . Amitriptyline     Pt unsure of what happens  . Berniece Salines Flavor Diarrhea  . Cyclobenzaprine Other (See Comments)    Pt unsure what hapens  . Fish Oil Itching  . Lidocaine Nausea And Vomiting    Nasal Spray (only time had reaction)  . Other Diarrhea  Reactive agents: Onions, bacon, steak  . Percocet [Oxycodone-Acetaminophen] Other (See Comments)    Lowers BP    Social History:   Social History   Socioeconomic History  . Marital status: Married    Spouse name: Not on file  . Number of children: Not on file  . Years of education: Not on file  . Highest education level: Not on file  Occupational History  . Not on file  Social Needs  . Financial resource strain: Not on file  . Food insecurity:    Worry: Not on file    Inability: Not on file  . Transportation needs:    Medical: Not on file    Non-medical: Not on file  Tobacco Use  . Smoking status: Former Smoker    Packs/day: 2.50    Types: Cigarettes    Last attempt to quit: 08/21/1991    Years since quitting: 26.9  . Smokeless tobacco: Never Used  Substance and Sexual Activity  . Alcohol use: Not Currently  . Drug use: No  . Sexual activity: Not on file  Lifestyle  . Physical activity:    Days per week: Not on file    Minutes per session: Not on file  . Stress: Not on file  Relationships  . Social connections:    Talks on phone: Not on file    Gets together: Not on file    Attends religious service: Not on file    Active member of club or organization: Not on file    Attends meetings of clubs or organizations: Not on file    Relationship status: Not on file  . Intimate partner violence:    Fear of current or ex partner: Not on file    Emotionally abused: Not on file    Physically abused: Not on file    Forced sexual activity: Not on file   Other Topics Concern  . Not on file  Social History Narrative  . Not on file    Family History:    Family History  Problem Relation Age of Onset  . Heart disease Father 83  . Heart attack Father      ROS:  Please see the history of present illness.  Review of Systems  Constitutional: Negative.   Respiratory: Negative.   Cardiovascular: Negative.   Gastrointestinal: Negative.   Musculoskeletal: Positive for back pain.       Left lower rib pain, left flank pain radiating back to left scapular pain left side of neck  Neurological: Negative.   Psychiatric/Behavioral: Negative.   All other systems reviewed and are negative.   Physical Exam/Data:   Vitals:   08/01/18 0407 08/01/18 0737 08/01/18 0741 08/01/18 1625  BP: (!) 106/56 114/80 130/78 (!) 129/93  Pulse: 62 (!) 59 72 61  Resp: 18     Temp: 98 F (36.7 C) 98 F (36.7 C) 99 F (37.2 C) 98.2 F (36.8 C)  TempSrc: Oral Oral Oral Oral  SpO2: 96% 95% 99% 97%  Weight: 89.1 kg     Height:        Intake/Output Summary (Last 24 hours) at 08/01/2018 1653 Last data filed at 08/01/2018 1341 Gross per 24 hour  Intake 1933.24 ml  Output 1050 ml  Net 883.24 ml   Last 3 Weights 08/01/2018 07/31/2018 02/24/2018  Weight (lbs) 196 lb 6.4 oz 189 lb 198 lb  Weight (kg) 89.086 kg 85.73 kg 89.812 kg     Body mass index is 29 kg/m.  General:  Well nourished, well developed, moderate distress rolling onto his side for exam, discomfort raising his left arm above his head HEENT: normal Lymph: no adenopathy Neck: no JVD Endocrine:  No thryomegaly Vascular: No carotid bruits; FA pulses 2+ bilaterally without bruits  Cardiac:  normal S1, S2; RRR; no murmur  Lungs:  clear to auscultation bilaterally, no wheezing, rhonchi or rales  Abd: soft, nontender, no hepatomegaly  Ext: no edema Musculoskeletal:  No deformities, BUE and BLE strength normal and equal Tenderness on palpation left lower ribs radiating around left flank to his left  scapular area Tenderness left of spine Skin: warm and dry  Neuro:  CNs 2-12 intact, no focal abnormalities noted Psych:  Normal affect   EKG:  The EKG was personally reviewed and demonstrates: Shows normal sinus rhythm rate 68 bpm nonspecific T wave abnormality  Telemetry:  Telemetry was personally reviewed and demonstrates: Normal sinus rhythm  Relevant CV Studies: Echo today  1. The left ventricle has mild-moderately reduced systolic function, with an ejection fraction of 40-45%. The cavity size was mildly dilated. Septal wall hypokinesis consistent with post-operative state. Unable to exclude additional regional wall motion  abnormality. Left ventricular diastolic Doppler parameters are consistent with pseudonormalization.  2. The right ventricle has normal systolic function. The cavity was normal. There is no increase in right ventricular wall thickness. Normal RVSP  3. Small pericardial effusion.  Laboratory Data:  Chemistry Recent Labs  Lab 07/31/18 1616 08/01/18 0515  NA 136 141  K 4.1 4.2  CL 94* 102  CO2 33* 32  GLUCOSE 144* 116*  BUN 12 12  CREATININE 0.78 0.81  CALCIUM 9.3 8.3*  GFRNONAA >60 >60  GFRAA >60 >60  ANIONGAP 9 7    No results for input(s): PROT, ALBUMIN, AST, ALT, ALKPHOS, BILITOT in the last 168 hours. Hematology Recent Labs  Lab 07/31/18 1616 08/01/18 0515  WBC 9.1 6.7  RBC 4.68 4.16*  HGB 15.3 13.5  HCT 45.5 41.2  MCV 97.2 99.0  MCH 32.7 32.5  MCHC 33.6 32.8  RDW 13.2 13.2  PLT 177 145*   Cardiac Enzymes Recent Labs  Lab 07/31/18 1616 07/31/18 2246  TROPONINI <0.03 <0.03   No results for input(s): TROPIPOC in the last 168 hours.  BNPNo results for input(s): BNP, PROBNP in the last 168 hours.  DDimer No results for input(s): DDIMER in the last 168 hours.  Radiology/Studies:  Dg Chest Portable 1 View  Result Date: 07/31/2018 CLINICAL DATA:  Left chest pain EXAM: PORTABLE CHEST 1 VIEW COMPARISON:  04/07/2018 FINDINGS: Prior CABG.  No confluent airspace opacities or effusions. Heart is normal size. No acute bony abnormality. IMPRESSION: Prior CABG.  No active disease. Electronically Signed   By: Rolm Baptise M.D.   On: 07/31/2018 16:32   Ct Angio Chest/abd/pel For Dissection W And/or Wo Contrast  Result Date: 07/31/2018 CLINICAL DATA:  Chest pain EXAM: CT ANGIOGRAPHY CHEST, ABDOMEN AND PELVIS TECHNIQUE: Multidetector CT imaging through the chest, abdomen and pelvis was performed using the standard protocol during bolus administration of intravenous contrast. Multiplanar reconstructed images and MIPs were obtained and reviewed to evaluate the vascular anatomy. CONTRAST:  178mL ISOVUE-370 IOPAMIDOL (ISOVUE-370) INJECTION 76% COMPARISON:  CT abdomen pelvis 04/07/2018 FINDINGS: CTA CHEST FINDINGS Cardiovascular: --Heart: The heart size is normal. There is nopericardial effusion. There are coronary artery calcifications --Aorta: The course and caliber of the thoracic aorta are normal. There is mild aortic atherosclerotic calcification. Precontrast images show no aortic intramural hematoma. There is no blood  pool, dissection or penetrating ulcer demonstrated on arterial phase postcontrast imaging. There is a conventional 3 vessel aortic arch branching pattern. The proximal arch vessels are widely patent. --Pulmonary Arteries: Contrast timing is optimized for preferential opacification of the aorta. Within that limitation, normal central pulmonary arteries. Mediastinum/Nodes: No mediastinal, hilar or axillary lymphadenopathy. The visualized thyroid and thoracic esophageal course are unremarkable. Lungs/Pleura: No pulmonary nodules or masses. Left basilar atelectasis. No pleural effusion or pneumothorax. No focal airspace consolidation. No focal pleural abnormality. Musculoskeletal: No chest wall abnormality. No acute osseous findings. Review of the MIP images confirms the above findings. CTA ABDOMEN AND PELVIS FINDINGS VASCULAR Aorta: Normal  caliber aorta without aneurysm, dissection, vasculitis or hemodynamically significant stenosis. There is moderate aortic atherosclerosis. Celiac: No aneurysm, dissection or hemodynamically significant stenosis. Normal branching pattern. SMA: Widely patent without dissection or stenosis. Renals: Paired right and single left renal artery. No aneurysm, dissection, stenosis or evidence of fibromuscular dysplasia. IMA: Patent without abnormality. Inflow: No aneurysm, stenosis or dissection. Veins: Normal course and caliber of the major veins. Assessment is otherwise limited by the arterial dominant contrast phase. Review of the MIP images confirms the above findings. NON-VASCULAR Hepatobiliary: Normal hepatic contours and density. No visible biliary dilatation. Status post cholecystectomy. Pancreas: Normal contours without ductal dilatation. No peripancreatic fluid collection. Spleen: Normal arterial phase splenic enhancement pattern. Adrenals/Urinary Tract: --Adrenal glands: Normal. --Right kidney/ureter: No hydronephrosis or perinephric stranding. No nephrolithiasis. No obstructing ureteral stones. --Left kidney/ureter: No hydronephrosis or perinephric stranding. No nephrolithiasis. No obstructing ureteral stones. --Urinary bladder: Unremarkable. Stomach/Bowel: --Stomach/Duodenum: No hiatal hernia or other gastric abnormality. Normal duodenal course and caliber. --Small bowel: No dilatation or inflammation. --Colon: There is descending colonic and rectosigmoid diverticulosis with mild fat stranding adjacent the distal descending colon (5:169). No free intraperitoneal air or intra-abdominal fluid collection. --Appendix: Normal. Lymphatic: No abdominal or pelvic lymphadenopathy. Reproductive: Normal prostate and seminal vesicles. Musculoskeletal. No bony spinal canal stenosis or focal osseous abnormality. Other: None. Review of the MIP images confirms the above findings. IMPRESSION: 1. No acute aortic syndrome. 2.  Descending colonic and rectosigmoid diverticulosis with mild fat stranding adjacent to the distal descending colon, which may indicate early acute diverticulitis. 3. Coronary artery and Aortic Atherosclerosis (ICD10-I70.0). Electronically Signed   By: Ulyses Jarred M.D.   On: 07/31/2018 17:57    Assessment and Plan:   1) musculoskeletal chest pain Radiating around left flank, left scapular area Severe pain 8 days Denies any trauma, heavy lifting, denies overexerting Reports he is retired, on disability.  Reports having similar symptoms in the past that were partially relieved with cortisone injections He does have appointment with pain clinic this coming Tuesday in 3 days and would like to make that appointment -Recommend muscle relaxer pills, Vicodin, NSAIDs , with PPI, ice packs -No ischemic work-up needed at this time --If symptoms do not improve could consider x-rays back, MRI Unable to exclude disc herniation. Could discuss with Ortho, even consider course of prednisone  2.  Acute abdominal pain  He has had history of diverticulitis in the past Diverticulosis noticed on CT, unable to exclude diverticulitis on ABX:  IV levofloxacin and Flagyl. Symptoms already starting to improve  3 .    Coronary artery disease with stable angina H/o CAD status post bypass surgery x2 and prior stents placement Cardiac exam negative x 2 Twelve-lead EKG with normal sinus rhythm. No further ischemic w/u at this time  4.  Hyperlipidemia  LDL at 56  5.  Hypertension- blood pressure  stable continue current regimen  6.  Chronic neck pain and back pain continue current home medication regimen H/o fibromyalgia As detailed in #1, presenting with musculoskeletal chest flank back pain He has follow-up with pain clinic, may benefit from cortisones     Total encounter time more than 110 minutes  Greater than 50% was spent in counseling and coordination of care with the patient Home For  questions or updates, please contact [facility] Please consult [email or web address] for contact info under     Signed, Ida Rogue, MD  08/01/2018 4:53 PM

## 2018-08-02 DIAGNOSIS — R0782 Intercostal pain: Secondary | ICD-10-CM | POA: Diagnosis not present

## 2018-08-02 DIAGNOSIS — M5090 Cervical disc disorder, unspecified, unspecified cervical region: Secondary | ICD-10-CM | POA: Diagnosis not present

## 2018-08-02 DIAGNOSIS — I255 Ischemic cardiomyopathy: Secondary | ICD-10-CM

## 2018-08-02 DIAGNOSIS — I25118 Atherosclerotic heart disease of native coronary artery with other forms of angina pectoris: Secondary | ICD-10-CM | POA: Diagnosis not present

## 2018-08-02 DIAGNOSIS — Z951 Presence of aortocoronary bypass graft: Secondary | ICD-10-CM | POA: Diagnosis not present

## 2018-08-02 LAB — HIV ANTIBODY (ROUTINE TESTING W REFLEX): HIV Screen 4th Generation wRfx: NONREACTIVE

## 2018-08-02 MED ORDER — METHOCARBAMOL 500 MG PO TABS: 500.0000 mg | ORAL_TABLET | Freq: Two times a day (BID) | ORAL | 0 refills | Status: DC | PRN

## 2018-08-02 MED ORDER — IBUPROFEN 600 MG PO TABS: 600.0000 mg | ORAL_TABLET | Freq: Three times a day (TID) | ORAL | 0 refills | Status: DC | PRN

## 2018-08-02 MED ORDER — LEVOFLOXACIN 500 MG PO TABS: 500.0000 mg | ORAL_TABLET | Freq: Every day | ORAL | 0 refills | Status: AC

## 2018-08-02 MED ORDER — METRONIDAZOLE 500 MG PO TABS: 500.0000 mg | ORAL_TABLET | Freq: Three times a day (TID) | ORAL | 0 refills | Status: AC

## 2018-08-02 NOTE — Progress Notes (Signed)
Progress Note  Patient Name: Willie Clark Date of Encounter: 08/02/2018  Primary Cardiologist: [name], MD   Subjective   Reports that he was able to sleep well overnight on various cocktail of medications This morning woke up when medications had worn off and walk to the bathroom, had moderate to severe discomfort Now difficulty trying to get comfortable again Has Toradol injection as needed, Robaxin as needed, more pain as needed,   Abdomen not as good today compared to yesterday, mild discomfort  -States that pain medication recently provided by primary care makes him feel nauseous, have sweats -Was previously given pain medication by back doctor that did not have the same reaction  Inpatient Medications    Scheduled Meds: . aspirin EC  81 mg Oral Daily  . citalopram  20 mg Oral Daily  . docusate sodium  100 mg Oral BID  . enoxaparin (LOVENOX) injection  40 mg Subcutaneous Q24H  . finasteride  5 mg Oral Daily  . gabapentin  800 mg Oral TID  . indapamide  2.5 mg Oral Daily  . loratadine  10 mg Oral Daily  . pantoprazole  40 mg Oral BID  . psyllium  1 packet Oral Daily  . rosuvastatin  20 mg Oral QHS  . triamcinolone ointment  1 application Topical BID  . vitamin B-12  1,000 mcg Oral Daily   Continuous Infusions: . sodium chloride Stopped (08/01/18 2200)  . sodium chloride 250 mL (08/02/18 0358)  . levofloxacin (LEVAQUIN) IV Stopped (08/01/18 2200)  . metronidazole 500 mg (08/02/18 0359)   PRN Meds: sodium chloride, sodium chloride, acetaminophen, albuterol, ketorolac, methocarbamol, morphine injection **OR** morphine injection, ondansetron (ZOFRAN) IV   Vital Signs    Vitals:   08/01/18 2004 08/02/18 0457 08/02/18 0621 08/02/18 0749  BP: 105/67 123/87 116/77 131/76  Pulse: (!) 53  (!) 59 (!) 56  Resp: 18 19 16    Temp: 98.5 F (36.9 C) (!) 97.2 F (36.2 C) 98.4 F (36.9 C) 98.3 F (36.8 C)  TempSrc: Oral Oral Oral Oral  SpO2: 96% 95% 96% 98%   Weight:      Height:        Intake/Output Summary (Last 24 hours) at 08/02/2018 1116 Last data filed at 08/02/2018 0925 Gross per 24 hour  Intake 1173.36 ml  Output -  Net 1173.36 ml   Last 3 Weights 08/01/2018 07/31/2018 02/24/2018  Weight (lbs) 196 lb 6.4 oz 189 lb 198 lb  Weight (kg) 89.086 kg 85.73 kg 89.812 kg      Telemetry    NSR - Personally Reviewed  ECG     - Personally Reviewed  Physical Exam   GEN: No acute distress.   Neck: No JVD Cardiac: RRR, no murmurs, rubs, or gallops.  Respiratory: Clear to auscultation bilaterally. GI: Soft, nontender, non-distended  MS: No edema; No deformity. Difficulty twisting his upper body secondary to pain Neuro:  Nonfocal  Psych: Normal affect   Labs    Chemistry Recent Labs  Lab 07/31/18 1616 08/01/18 0515  NA 136 141  K 4.1 4.2  CL 94* 102  CO2 33* 32  GLUCOSE 144* 116*  BUN 12 12  CREATININE 0.78 0.81  CALCIUM 9.3 8.3*  GFRNONAA >60 >60  GFRAA >60 >60  ANIONGAP 9 7     Hematology Recent Labs  Lab 07/31/18 1616 08/01/18 0515  WBC 9.1 6.7  RBC 4.68 4.16*  HGB 15.3 13.5  HCT 45.5 41.2  MCV 97.2 99.0  MCH  32.7 32.5  MCHC 33.6 32.8  RDW 13.2 13.2  PLT 177 145*    Cardiac Enzymes Recent Labs  Lab 07/31/18 1616 07/31/18 2246  TROPONINI <0.03 <0.03   No results for input(s): TROPIPOC in the last 168 hours.   BNPNo results for input(s): BNP, PROBNP in the last 168 hours.   DDimer No results for input(s): DDIMER in the last 168 hours.   Radiology    Dg Chest Portable 1 View  Result Date: 07/31/2018 CLINICAL DATA:  Left chest pain EXAM: PORTABLE CHEST 1 VIEW COMPARISON:  04/07/2018 FINDINGS: Prior CABG. No confluent airspace opacities or effusions. Heart is normal size. No acute bony abnormality. IMPRESSION: Prior CABG.  No active disease. Electronically Signed   By: Rolm Baptise M.D.   On: 07/31/2018 16:32   Ct Angio Chest/abd/pel For Dissection W And/or Wo Contrast  Result Date: 07/31/2018  CLINICAL DATA:  Chest pain EXAM: CT ANGIOGRAPHY CHEST, ABDOMEN AND PELVIS TECHNIQUE: Multidetector CT imaging through the chest, abdomen and pelvis was performed using the standard protocol during bolus administration of intravenous contrast. Multiplanar reconstructed images and MIPs were obtained and reviewed to evaluate the vascular anatomy. CONTRAST:  178mL ISOVUE-370 IOPAMIDOL (ISOVUE-370) INJECTION 76% COMPARISON:  CT abdomen pelvis 04/07/2018 FINDINGS: CTA CHEST FINDINGS Cardiovascular: --Heart: The heart size is normal. There is nopericardial effusion. There are coronary artery calcifications --Aorta: The course and caliber of the thoracic aorta are normal. There is mild aortic atherosclerotic calcification. Precontrast images show no aortic intramural hematoma. There is no blood pool, dissection or penetrating ulcer demonstrated on arterial phase postcontrast imaging. There is a conventional 3 vessel aortic arch branching pattern. The proximal arch vessels are widely patent. --Pulmonary Arteries: Contrast timing is optimized for preferential opacification of the aorta. Within that limitation, normal central pulmonary arteries. Mediastinum/Nodes: No mediastinal, hilar or axillary lymphadenopathy. The visualized thyroid and thoracic esophageal course are unremarkable. Lungs/Pleura: No pulmonary nodules or masses. Left basilar atelectasis. No pleural effusion or pneumothorax. No focal airspace consolidation. No focal pleural abnormality. Musculoskeletal: No chest wall abnormality. No acute osseous findings. Review of the MIP images confirms the above findings. CTA ABDOMEN AND PELVIS FINDINGS VASCULAR Aorta: Normal caliber aorta without aneurysm, dissection, vasculitis or hemodynamically significant stenosis. There is moderate aortic atherosclerosis. Celiac: No aneurysm, dissection or hemodynamically significant stenosis. Normal branching pattern. SMA: Widely patent without dissection or stenosis. Renals:  Paired right and single left renal artery. No aneurysm, dissection, stenosis or evidence of fibromuscular dysplasia. IMA: Patent without abnormality. Inflow: No aneurysm, stenosis or dissection. Veins: Normal course and caliber of the major veins. Assessment is otherwise limited by the arterial dominant contrast phase. Review of the MIP images confirms the above findings. NON-VASCULAR Hepatobiliary: Normal hepatic contours and density. No visible biliary dilatation. Status post cholecystectomy. Pancreas: Normal contours without ductal dilatation. No peripancreatic fluid collection. Spleen: Normal arterial phase splenic enhancement pattern. Adrenals/Urinary Tract: --Adrenal glands: Normal. --Right kidney/ureter: No hydronephrosis or perinephric stranding. No nephrolithiasis. No obstructing ureteral stones. --Left kidney/ureter: No hydronephrosis or perinephric stranding. No nephrolithiasis. No obstructing ureteral stones. --Urinary bladder: Unremarkable. Stomach/Bowel: --Stomach/Duodenum: No hiatal hernia or other gastric abnormality. Normal duodenal course and caliber. --Small bowel: No dilatation or inflammation. --Colon: There is descending colonic and rectosigmoid diverticulosis with mild fat stranding adjacent the distal descending colon (5:169). No free intraperitoneal air or intra-abdominal fluid collection. --Appendix: Normal. Lymphatic: No abdominal or pelvic lymphadenopathy. Reproductive: Normal prostate and seminal vesicles. Musculoskeletal. No bony spinal canal stenosis or focal osseous abnormality. Other: None. Review  of the MIP images confirms the above findings. IMPRESSION: 1. No acute aortic syndrome. 2. Descending colonic and rectosigmoid diverticulosis with mild fat stranding adjacent to the distal descending colon, which may indicate early acute diverticulitis. 3. Coronary artery and Aortic Atherosclerosis (ICD10-I70.0). Electronically Signed   By: Ulyses Jarred M.D.   On: 07/31/2018 17:57     Cardiac Studies   Echo yesterday  1. The left ventricle has mild-moderately reduced systolic function, with an ejection fraction of 40 to 45%. The cavity size was mildly dilated. Septal wall hypokinesis consistent with post-operative state. Unable to exclude additional regional wall  motion abnormality. Left ventricular diastolic Doppler parameters are consistent with pseudonormalization.  2. The right ventricle has normal systolic function. The cavity was normal. There is no increase in right ventricular wall thickness. Normal RVSP  3. Small pericardial effusion.  Patient Profile     68 y.o. male Amond Speranza Wilmes is a 68 y.o. male with a hx of coronary artery disease.   coronary artery bypass graft surgery in 1993 and redo in 2007.   fibromyalgia and chronic pain.  GI symptoms in 2015 due to documented gastric ulcer.  neck surgery in 2542 without complications.  Chronic dizziness secondary to orthostatic hypotension better after stopping metoprolol Last cardiac catheterization June 2017 He presents to the hospital with chest pain  Assessment & Plan    1) musculoskeletal chest pain Radiating around left flank, left scapular area, up into left neck Severe pain over 1 week, Denies any trauma, heavy lifting, denies overexerting -Previously treated by pain clinic for similar symptoms 1 month ago Scheduled to have visit this Tuesday -Reports pain medication provided by primary caregiver some nausea and sweats Did better on different type of pain medication provided by pain clinic Would like to switch pain medications Would continue muscle relaxers and anti-inflammatory Will likely need to take this this combination every 4-6 hours until seen by pain clinic -If persistent problems may need MRI, systemic steroids  2.  Acuteabdominal pain  He has had history of diverticulitis in the past Diverticulosis noticed on CT, unable to exclude diverticulitis on ABX:  IV levofloxacin and Flagyl.  Still with mild symptoms  3 .   Coronary artery disease with stable angina H/o CAD status post bypass surgery x2 and prior stents placement Cardiac exam negative x 2 Twelve-lead EKG with normal sinus rhythm. No further ischemic w/u at this time  4.  Cardiomyopathy Echocardiogram reviewed from yesterday compared to prior echocardiograms Anteroseptal wall hypokinesis seen consistently on prior echocardiogram secondary to CABG Grossly there is no significant difference in ejection fraction Definity used on the study, EF likely 40- 45% Difficult images in general Follow-up with Dr. Fletcher Anon.  Given no anginal symptoms, stress SPECT he does not need ischemic work-up at this time  5. Hyperlipidemia  LDL at 56  6. Hypertension- Blood pressure is well controlled on today's visit. No changes made to the medications. Stable  6. Chronic neck pain and back pain continue current home medication regimen H/o fibromyalgia He is having musculoskeletal pain   Total encounter time more than 25 minutes  Greater than 50% was spent in counseling and coordination of care with the patient   [facility] will sign off.   Medication Recommendations: As detailed above Other recommendations (labs, testing, etc): No further testing Follow up as an outpatient: With Dr. Rod Can  For questions or updates, please contact [facility] Please consult [email or web address] for contact info under  Signed, Ida Rogue, MD  08/02/2018, 11:16 AM

## 2018-08-02 NOTE — Discharge Instructions (Signed)
Follow-up with primary care physician in 3 days Follow-up with cardiology Dr. Fletcher Anon in a week Follow-up with pain management clinic as recommended or in 4 to 5 days- call and make appointments

## 2018-08-02 NOTE — Plan of Care (Signed)
  Problem: Pain Managment: Goal: General experience of comfort will improve Outcome: Progressing   Problem: Safety: Goal: Ability to remain free from injury will improve Outcome: Progressing   Problem: Cardiac: Goal: Ability to achieve and maintain adequate cardiovascular perfusion will improve Outcome: Progressing

## 2018-08-02 NOTE — Progress Notes (Signed)
Pt to be dischaerged today. Iv's and tele removed. disch instructions aand prescriptions given to him. disch via w.c. wife to transport

## 2018-08-02 NOTE — Discharge Summary (Signed)
[facility] [place] NAME: Willie Clark    MR#:  [id number]  DATE OF BIRTH:  1950-07-27  DATE OF ADMISSION:  07/31/2018 ADMITTING PHYSICIAN: Otila Back, MD  DATE OF DISCHARGE:  08/02/18 PRIMARY CARE PHYSICIAN: [name], MD    ADMISSION DIAGNOSIS:  Moderate coronary artery risk chest pain [R07.9]  DISCHARGE DIAGNOSIS:  Atypical chest pain seems to be musculoskeletal Acute diverticulitis  SECONDARY DIAGNOSIS:   Past Medical History:  Diagnosis Date  . (HFpEF) heart failure with preserved ejection fraction ([place])    a. 09/2016 Echo: EF 55-60%, no rwma, triv AI, mild MR, mildly dil LA.  [name] Anxiety   . Arthritis   . Barrett's esophagus   . Bowel obstruction ([place]) 02/2009   small bowel  . Chronic chest pain   . Coronary artery disease    a. 1993 CABG; b. 2007 Redo CABG; c. 09/2015 Cath: LM mild dzs, LAD 100ost, RI mild dzs, LCX 60p, OM1 100, RCA 100ost, VG->OM2 min irregs, LIMA->LAD nl, VG->RPDA 30ost, 20d, nl EF->Med rx.  . Depression   . Diverticulitis   . Diverticulosis   . Erectile dysfunction   . Fibromyalgia   . Gastric ulcer   . Gastritis   . GERD (gastroesophageal reflux disease)   . Headache   . Hemorrhoids   . Hyperlipidemia   . Hypertension   . Kidney stones    going to [facility]  June 26 to see kidney specialist  . Lightheadedness   . Mass on back   . MI (myocardial infarction) ([place])   . Nephrolithiasis   . Orthostatic hypotension    a. Improved after discontinuation of metoprolol.  . OSA (obstructive sleep apnea)    a. On CPAP.  [name] Pulmonary embolism ([place]) 01/2006   Post-op/treated  . Skin lesions, generalized   . Stroke [facility]) 1993   right side weakness no blood thinners   on Aspirin  . Syncope and collapse   . Urticaria     HOSPITAL COURSE:   HPI Willie Clark  is a 68 y.o. male with a known history of CAD status post bypass surgery x2 and prior stents placements who follows up with  cardiologist Dr. Jenna Luo and hyperlipidemia presented to the emergency room with complaints of chest pains.  This is been going on off and on for the last 2 weeks.  Chest pain located left side of the chest radiating to the left arm.  Chest pain described as feeling of heaviness in the left chest.  No aggravating or relieving factors.  Today patient started having lower abdominal pains.  Has some nausea but no vomiting.  No diarrhea.  No fevers.  Patient has no shortness of breath.  Patient was evaluated in the emergency room and given nitroglycerin.  Transiently had a drop in blood pressure which resolved with IV fluids with most recent blood pressure of two 119/70.  Troponin level was less than 0.03.  Twelve-lead EKG with normal sinus rhythm. Patient had CT abdomen and pelvis as well as CT chest done which was negative for any acute aortic syndrome.  There were findings however suggestive of early acute diverticulitis.  1.Acute abdominal pain secondary to early diverticulitis Clinically feeling much better and wants to go home on IV levofloxacin and Flagyl.-Clinically improving wants to go home changed to p.o. antibiotics IV fluid hydration provided Monitor clinically, follow-up with primary care physician in 3 days Pain management as needed.  Outpatient follow-up with  pain management clinic Tolerating diet without any difficulty  2.  Chest pain -musculoskeletal Patient with significant cardiac history including CAD status post bypass surgery x2 and prior stents placement Acute MI ruled out with negative troponins patient is asymptomatic today LDL 56 Muscle relaxants and NSAIDs with PPI are helping with the pain Twelve-lead EKG with normal sinus rhythm. Seen by cardiology Dr. Rockey Situ  -Outpatient follow-up with pain clinic  3.  Hyperlipidemia LDL at 56  4.  Hypertension-blood pressure stable continue current regimen  5.  Chronic neck pain and back pain continue current home medication  regimen Outpatient follow-up with pain clinic  6.  Cardiomyopathy Echocardiogram done and results reviewed by cardiology EF likely 40 to 45% Follow-up with Dr. Fletcher Anon   DISCHARGE CONDITIONS:   STABLE  CONSULTS OBTAINED:     PROCEDURES  NONE   DRUG ALLERGIES:   Allergies  Allergen Reactions  . Amitriptyline     Pt unsure of what happens  . Berniece Salines Flavor Diarrhea  . Cyclobenzaprine Other (See Comments)    Pt unsure what hapens  . Fish Oil Itching  . Lidocaine Nausea And Vomiting    Nasal Spray (only time had reaction)  . Other Diarrhea    Reactive agents: Onions, bacon, steak  . Percocet [Oxycodone-Acetaminophen] Other (See Comments)    Lowers BP    DISCHARGE MEDICATIONS:   Allergies as of 08/02/2018      Reactions   Amitriptyline    Pt unsure of what happens   Berniece Salines Flavor Diarrhea   Cyclobenzaprine Other (See Comments)   Pt unsure what hapens   Fish Oil Itching   Lidocaine Nausea And Vomiting   Nasal Spray (only time had reaction)   Other Diarrhea   Reactive agents: Onions, bacon, steak   Percocet [oxycodone-acetaminophen] Other (See Comments)   Lowers BP      Medication List    TAKE these medications   acetaminophen 500 MG tablet Commonly known as:  TYLENOL Take 500 mg by mouth every 8 (eight) hours as needed for mild pain.   [place] by Does not apply route as directed.   albuterol 108 (90 Base) MCG/ACT inhaler Commonly known as:  PROVENTIL HFA;VENTOLIN HFA Inhale into the lungs every 4 (four) hours as needed for wheezing or shortness of breath.   aspirin EC 81 MG tablet Take 81 mg by mouth daily.   citalopram 20 MG tablet Commonly known as:  CELEXA Take 20 mg by mouth daily.   docusate sodium 100 MG capsule Commonly known as:  COLACE Take 100 mg by mouth 2 (two) times daily.   fexofenadine 180 MG tablet Commonly known as:  ALLEGRA Take 180 mg by mouth daily.   FIBER PO Take 1 tablet by mouth daily.    finasteride 5 MG tablet Commonly known as:  PROSCAR Take 5 mg by mouth daily.   furosemide 40 MG tablet Commonly known as:  LASIX Take 40 mg by mouth daily as needed for fluid.   gabapentin 800 MG tablet Commonly known as:  NEURONTIN Take 800 mg by mouth 3 (three) times daily.   HAIR/SKIN/NAILS PO Take 1 tablet by mouth 3 (three) times daily.   HYDROcodone-acetaminophen 5-325 MG tablet Commonly known as:  NORCO/VICODIN Take 1 tablet by mouth every 6 (six) hours as needed.   ibuprofen 600 MG tablet Commonly known as:  ADVIL,MOTRIN Take 1 tablet (600 mg total) by mouth every 8 (eight) hours as needed for moderate pain or cramping.  indapamide 2.5 MG tablet Commonly known as:  LOZOL Take 2.5 mg by mouth daily.   levofloxacin 500 MG tablet Commonly known as:  LEVAQUIN Take 1 tablet (500 mg total) by mouth daily for 5 days.   methocarbamol 500 MG tablet Commonly known as:  ROBAXIN Take 1 tablet (500 mg total) by mouth 2 (two) times daily as needed for muscle spasms.   Methylcellulose (Laxative) 500 MG Tabs Take 1 tablet by mouth daily.   metroNIDAZOLE 500 MG tablet Commonly known as:  Flagyl Take 1 tablet (500 mg total) by mouth 3 (three) times daily for 5 days.   nitroGLYCERIN 0.4 MG SL tablet Commonly known as:  NITROSTAT Place 0.4 mg under the tongue every 5 (five) minutes as needed for chest pain.   pantoprazole 40 MG tablet Commonly known as:  PROTONIX Take 40 mg by mouth 2 (two) times daily.   potassium chloride SA 20 MEQ tablet Commonly known as:  K-DUR,KLOR-CON Take 20 mEq by mouth daily.   psyllium 0.52 g capsule Commonly known as:  REGULOID Take 0.52 g by mouth daily.   rosuvastatin 20 MG tablet Commonly known as:  CRESTOR Take 20 mg by mouth at bedtime.   triamcinolone ointment 0.1 % Commonly known as:  KENALOG Apply 1 application topically 2 (two) times daily.   vitamin B-12 1000 MCG tablet Commonly known as:  CYANOCOBALAMIN Take 1,000 mcg  by mouth daily.        DISCHARGE INSTRUCTIONS:   Follow-up with primary care physician in 3 days Follow-up with cardiology Dr. Fletcher Anon in a week Follow-up with pain management clinic as recommended or in 4 to 5 days  DIET:  Cardiac diet  DISCHARGE CONDITION:  Stable  ACTIVITY:  Activity as tolerated  OXYGEN:  Home Oxygen: No.   Oxygen Delivery: room air  DISCHARGE LOCATION:  home   If you experience worsening of your admission symptoms, develop shortness of breath, life threatening emergency, suicidal or homicidal thoughts you must seek medical attention immediately by calling 911 or calling your MD immediately  if symptoms less severe.  You Must read complete instructions/literature along with all the possible adverse reactions/side effects for all the Medicines you take and that have been prescribed to you. Take any new Medicines after you have completely understood and accpet all the possible adverse reactions/side effects.   Please note  You were cared for by a hospitalist during your hospital stay. If you have any questions about your discharge medications or the care you received while you were in the hospital after you are discharged, you can call the unit and asked to speak with the hospitalist on call if the hospitalist that took care of you is not available. Once you are discharged, your primary care physician will handle any further medical issues. Please note that NO REFILLS for any discharge medications will be authorized once you are discharged, as it is imperative that you return to your primary care physician (or establish a relationship with a primary care physician if you do not have one) for your aftercare needs so that they can reassess your need for medications and monitor your lab values.     Today  Chief Complaint  Patient presents with  . Chest Pain   Patient is feeling better.  Denies any nausea vomiting tolerating diet.  Abdominal pain 2 out of 10  musculoskeletal pain in the chest significantly improved with muscle relaxants and wants to go home.  Cardiology is okay to discharge patient  from their standpoint.  Patient sees pain management clinic as an outpatient  ROS:  CONSTITUTIONAL: Denies fevers, chills. Denies any fatigue, weakness.  EYES: Denies blurry vision, double vision, eye pain. EARS, NOSE, THROAT: Denies tinnitus, ear pain, hearing loss. RESPIRATORY: Denies cough, wheeze, shortness of breath.  CARDIOVASCULAR: Denies chest pain, palpitations, edema.  GASTROINTESTINAL: Denies nausea, vomiting, diarrhea, abdominal pain. Denies bright red blood per rectum. GENITOURINARY: Denies dysuria, hematuria. ENDOCRINE: Denies nocturia or thyroid problems. HEMATOLOGIC AND LYMPHATIC: Denies easy bruising or bleeding. SKIN: Denies rash or lesion. MUSCULOSKELETAL: Denies pain in neck, back, shoulder, knees, hips or arthritic symptoms.  NEUROLOGIC: Denies paralysis, paresthesias.  PSYCHIATRIC: Denies anxiety or depressive symptoms.   VITAL SIGNS:  Blood pressure 131/76, pulse (!) 56, temperature 98.3 F (36.8 C), temperature source Oral, resp. rate 16, height 5\' 9"  (1.753 m), weight 89.1 kg, SpO2 98 %.  I/O:    Intake/Output Summary (Last 24 hours) at 08/02/2018 1249 Last data filed at 08/02/2018 0925 Gross per 24 hour  Intake 1173.36 ml  Output -  Net 1173.36 ml    PHYSICAL EXAMINATION:  GENERAL:  67 y.o.-year-old patient lying in the bed with no acute distress.  EYES: Pupils equal, round, reactive to light and accommodation. No scleral icterus. Extraocular muscles intact.  HEENT: Head atraumatic, normocephalic. Oropharynx and nasopharynx clear.  NECK:  Supple, no jugular venous distention. No thyroid enlargement, no tenderness.  LUNGS: Normal breath sounds bilaterally, no wheezing, rales,rhonchi or crepitation. No use of accessory muscles of respiration.  CARDIOVASCULAR: S1, S2 normal. No murmurs, rubs, or gallops.  ABDOMEN:  Soft, non-tender, non-distended. Bowel sounds present.  EXTREMITIES: No pedal edema, cyanosis, or clubbing.  NEUROLOGIC: Awake, alert and oriented x3 sensation intact. Gait not checked.  PSYCHIATRIC: The patient is alert and oriented x 3.  SKIN: No obvious rash, lesion, or ulcer.   DATA REVIEW:   CBC Recent Labs  Lab 08/01/18 0515  WBC 6.7  HGB 13.5  HCT 41.2  PLT 145*    Chemistries  Recent Labs  Lab 08/01/18 0515  NA 141  K 4.2  CL 102  CO2 32  GLUCOSE 116*  BUN 12  CREATININE 0.81  CALCIUM 8.3*  MG 2.0    Cardiac Enzymes Recent Labs  Lab 07/31/18 2246  TROPONINI <0.03    Microbiology Results  Results for orders placed or performed during the hospital encounter of 09/26/16  Surgical pcr screen     Status: None   Collection Time: 09/26/16  9:14 AM  Result Value Ref Range Status   MRSA, PCR NEGATIVE NEGATIVE Final   Staphylococcus aureus NEGATIVE NEGATIVE Final    Comment:        The Xpert SA Assay (FDA approved for NASAL specimens in patients over 20 years of age), is one component of a comprehensive surveillance program.  Test performance has been validated by [facility] for patients greater than or equal to 27 year old. It is not intended to diagnose infection nor to guide or monitor treatment.     RADIOLOGY:  Dg Chest Portable 1 View  Result Date: 07/31/2018 CLINICAL DATA:  Left chest pain EXAM: PORTABLE CHEST 1 VIEW COMPARISON:  04/07/2018 FINDINGS: Prior CABG. No confluent airspace opacities or effusions. Heart is normal size. No acute bony abnormality. IMPRESSION: Prior CABG.  No active disease. Electronically Signed   By: Rolm Baptise M.D.   On: 07/31/2018 16:32   Ct Angio Chest/abd/pel For Dissection W And/or Wo Contrast  Result Date: 07/31/2018 CLINICAL DATA:  Chest pain EXAM: CT ANGIOGRAPHY CHEST, ABDOMEN AND PELVIS TECHNIQUE: Multidetector CT imaging through the chest, abdomen and pelvis was performed using the standard protocol during  bolus administration of intravenous contrast. Multiplanar reconstructed images and MIPs were obtained and reviewed to evaluate the vascular anatomy. CONTRAST:  129mL ISOVUE-370 IOPAMIDOL (ISOVUE-370) INJECTION 76% COMPARISON:  CT abdomen pelvis 04/07/2018 FINDINGS: CTA CHEST FINDINGS Cardiovascular: --Heart: The heart size is normal. There is nopericardial effusion. There are coronary artery calcifications --Aorta: The course and caliber of the thoracic aorta are normal. There is mild aortic atherosclerotic calcification. Precontrast images show no aortic intramural hematoma. There is no blood pool, dissection or penetrating ulcer demonstrated on arterial phase postcontrast imaging. There is a conventional 3 vessel aortic arch branching pattern. The proximal arch vessels are widely patent. --Pulmonary Arteries: Contrast timing is optimized for preferential opacification of the aorta. Within that limitation, normal central pulmonary arteries. Mediastinum/Nodes: No mediastinal, hilar or axillary lymphadenopathy. The visualized thyroid and thoracic esophageal course are unremarkable. Lungs/Pleura: No pulmonary nodules or masses. Left basilar atelectasis. No pleural effusion or pneumothorax. No focal airspace consolidation. No focal pleural abnormality. Musculoskeletal: No chest wall abnormality. No acute osseous findings. Review of the MIP images confirms the above findings. CTA ABDOMEN AND PELVIS FINDINGS VASCULAR Aorta: Normal caliber aorta without aneurysm, dissection, vasculitis or hemodynamically significant stenosis. There is moderate aortic atherosclerosis. Celiac: No aneurysm, dissection or hemodynamically significant stenosis. Normal branching pattern. SMA: Widely patent without dissection or stenosis. Renals: Paired right and single left renal artery. No aneurysm, dissection, stenosis or evidence of fibromuscular dysplasia. IMA: Patent without abnormality. Inflow: No aneurysm, stenosis or dissection. Veins:  Normal course and caliber of the major veins. Assessment is otherwise limited by the arterial dominant contrast phase. Review of the MIP images confirms the above findings. NON-VASCULAR Hepatobiliary: Normal hepatic contours and density. No visible biliary dilatation. Status post cholecystectomy. Pancreas: Normal contours without ductal dilatation. No peripancreatic fluid collection. Spleen: Normal arterial phase splenic enhancement pattern. Adrenals/Urinary Tract: --Adrenal glands: Normal. --Right kidney/ureter: No hydronephrosis or perinephric stranding. No nephrolithiasis. No obstructing ureteral stones. --Left kidney/ureter: No hydronephrosis or perinephric stranding. No nephrolithiasis. No obstructing ureteral stones. --Urinary bladder: Unremarkable. Stomach/Bowel: --Stomach/Duodenum: No hiatal hernia or other gastric abnormality. Normal duodenal course and caliber. --Small bowel: No dilatation or inflammation. --Colon: There is descending colonic and rectosigmoid diverticulosis with mild fat stranding adjacent the distal descending colon (5:169). No free intraperitoneal air or intra-abdominal fluid collection. --Appendix: Normal. Lymphatic: No abdominal or pelvic lymphadenopathy. Reproductive: Normal prostate and seminal vesicles. Musculoskeletal. No bony spinal canal stenosis or focal osseous abnormality. Other: None. Review of the MIP images confirms the above findings. IMPRESSION: 1. No acute aortic syndrome. 2. Descending colonic and rectosigmoid diverticulosis with mild fat stranding adjacent to the distal descending colon, which may indicate early acute diverticulitis. 3. Coronary artery and Aortic Atherosclerosis (ICD10-I70.0). Electronically Signed   By: Ulyses Jarred M.D.   On: 07/31/2018 17:57    EKG:   Orders placed or performed during the hospital encounter of 07/31/18  . EKG 12-Lead  . EKG 12-Lead  . ED EKG  . ED EKG      Management plans discussed with the patient, he is in  agreement.  CODE STATUS:     Code Status Orders  (From admission, onward)         Start     Ordered   07/31/18 2027  Full code  Continuous     07/31/18 2026  Code Status History    Date Active Date Inactive Code Status Order ID Comments User Context   10/01/2016 1431 10/02/2016 1343 Full Code [id number]  Erline Levine, MD Inpatient   09/24/2015 0750 09/25/2015 2204 Full Code [id number]  Harrie Foreman, MD Inpatient      TOTAL TIME TAKING CARE OF THIS PATIENT: 85minutes.   Note: This dictation was prepared with Dragon dictation along with smaller phrase technology. Any transcriptional errors that result from this process are unintentional.   @[facility] @  on 08/02/2018 at 12:49 PM  Between 7am to 6pm - Pager - [phone]  After 6pm go to [email or web address] - password [email or web address] [facility]  Office  [phone]  CC: Primary care physician; [name], MD

## 2018-08-17 ENCOUNTER — Telehealth: Payer: Self-pay | Admitting: Cardiovascular Disease

## 2018-08-17 NOTE — Telephone Encounter (Signed)
Virtual Visit Pre-Appointment Phone Call  "(Name), I am calling you today to discuss your upcoming appointment. We are currently trying to limit exposure to the virus that causes COVID-19 by seeing patients at home rather than in the office."  1. "What is the BEST phone number to call the day of the visit?" - include this in appointment notes  2. Do you have or have access to (through a family member/friend) a smartphone with video capability that we can use for your visit?" a. If yes - list this number in appt notes as cell (if different from BEST phone #) and list the appointment type as a VIDEO visit in appointment notes b. If no - list the appointment type as a PHONE visit in appointment notes  3. Confirm consent - "In the setting of the current Covid19 crisis, you are scheduled for a (phone or video) visit with your provider on (date) at (time).  Just as we do with many in-office visits, in order for you to participate in this visit, we must obtain consent.  If you'd like, I can send this to your mychart (if signed up) or email for you to review.  Otherwise, I can obtain your verbal consent now.  All virtual visits are billed to your insurance company just like a normal visit would be.  By agreeing to a virtual visit, we'd like you to understand that the technology does not allow for your provider to perform an examination, and thus may limit your provider's ability to fully assess your condition. If your provider identifies any concerns that need to be evaluated in person, we will make arrangements to do so.  Finally, though the technology is pretty good, we cannot assure that it will always work on either your or our end, and in the setting of a video visit, we may have to convert it to a phone-only visit.  In either situation, we cannot ensure that we have a secure connection.  Are you willing to proceed?" STAFF: Did the patient verbally acknowledge consent to telehealth visit? Document  YES/NO here: Yes   4. Advise patient to be prepared - "Two hours prior to your appointment, go ahead and check your blood pressure, pulse, oxygen saturation, and your weight (if you have the equipment to check those) and write them all down. When your visit starts, your provider will ask you for this information. If you have an Apple Watch or Kardia device, please plan to have heart rate information ready on the day of your appointment. Please have a pen and paper handy nearby the day of the visit as well."  5. Give patient instructions for MyChart download to smartphone OR Doximity/Doxy.me as below if video visit (depending on what platform provider is using)  6. Inform patient they will receive a phone call 15 minutes prior to their appointment time (may be from unknown caller ID) so they should be prepared to answer    [facility] has been deemed a candidate for a follow-up tele-health visit to limit community exposure during the Covid-19 pandemic. I spoke with the patient via phone to ensure availability of phone/video source, confirm preferred email & phone number, and discuss instructions and expectations.  I reminded JETHRO RADKE to be prepared with any vital sign and/or heart rhythm information that could potentially be obtained via home monitoring, at the time of his visit. I reminded LERON STOFFERS to expect a phone call prior  to his visit.  Ace Gins 08/17/2018 11:44 AM   INSTRUCTIONS FOR DOWNLOADING THE MYCHART APP TO SMARTPHONE  - The patient must first make sure to have activated MyChart and know their login information - If Apple, go to [employer] and type in MyChart in the search bar and download the app. If Android, ask patient to go to [employer] and type in [place] in the search bar and download the app. The app is free but as with any other app downloads, their phone may require them to verify saved payment information or Apple/Android  password.  - The patient will need to then log into the app with their MyChart username and password, and select [facility] as their healthcare provider to link the account. When it is time for your visit, go to the MyChart app, find appointments, and click Begin Video Visit. Be sure to Select Allow for your device to access the Microphone and Camera for your visit. You will then be connected, and your provider will be with you shortly.  **If they have any issues connecting, or need assistance please contact MyChart service desk [phone])**  **If using a computer, in order to ensure the best quality for their visit they will need to use either of the following Internet Browsers: [employer], or Google Chrome**  IF USING DOXIMITY or DOXY.ME - The patient will receive a link just prior to their visit by text.     FULL LENGTH CONSENT FOR TELE-HEALTH VISIT   I hereby voluntarily request, consent and authorize [facility] and its employed or contracted physicians, physician assistants, nurse practitioners or other licensed health care professionals (the Practitioner), to provide me with telemedicine health care services (the Services") as deemed necessary by the treating Practitioner. I acknowledge and consent to receive the Services by the Practitioner via telemedicine. I understand that the telemedicine visit will involve communicating with the Practitioner through live audiovisual communication technology and the disclosure of certain medical information by electronic transmission. I acknowledge that I have been given the opportunity to request an in-person assessment or other available alternative prior to the telemedicine visit and am voluntarily participating in the telemedicine visit.  I understand that I have the right to withhold or withdraw my consent to the use of telemedicine in the course of my care at any time, without affecting my right to future care or treatment,  and that the Practitioner or I may terminate the telemedicine visit at any time. I understand that I have the right to inspect all information obtained and/or recorded in the course of the telemedicine visit and may receive copies of available information for a reasonable fee.  I understand that some of the potential risks of receiving the Services via telemedicine include:   Delay or interruption in medical evaluation due to technological equipment failure or disruption;  Information transmitted may not be sufficient (e.g. poor resolution of images) to allow for appropriate medical decision making by the Practitioner; and/or   In rare instances, security protocols could fail, causing a breach of personal health information.  Furthermore, I acknowledge that it is my responsibility to provide information about my medical history, conditions and care that is complete and accurate to the best of my ability. I acknowledge that Practitioner's advice, recommendations, and/or decision may be based on factors not within their control, such as incomplete or inaccurate data provided by me or distortions of diagnostic images or specimens that may result from electronic transmissions. I  understand that the practice of medicine is not an exact science and that Practitioner makes no warranties or guarantees regarding treatment outcomes. I acknowledge that I will receive a copy of this consent concurrently upon execution via email to the email address I last provided but may also request a printed copy by calling the office of [facility].    I understand that my insurance will be billed for this visit.   I have read or had this consent read to me.  I understand the contents of this consent, which adequately explains the benefits and risks of the Services being provided via telemedicine.   I have been provided ample opportunity to ask questions regarding this consent and the Services and have had my questions  answered to my satisfaction.  I give my informed consent for the services to be provided through the use of telemedicine in my medical care  By participating in this telemedicine visit I agree to the above.

## 2018-09-04 ENCOUNTER — Telehealth (INDEPENDENT_AMBULATORY_CARE_PROVIDER_SITE_OTHER): Payer: Medicare HMO | Admitting: Cardiovascular Disease

## 2018-09-04 ENCOUNTER — Encounter: Payer: Self-pay | Admitting: Cardiovascular Disease

## 2018-09-04 ENCOUNTER — Other Ambulatory Visit: Payer: Self-pay

## 2018-09-04 VITALS — BP 165/88 | HR 72 | Ht 69.0 in | Wt 187.2 lb

## 2018-09-04 DIAGNOSIS — E785 Hyperlipidemia, unspecified: Secondary | ICD-10-CM

## 2018-09-04 DIAGNOSIS — I10 Essential (primary) hypertension: Secondary | ICD-10-CM

## 2018-09-04 DIAGNOSIS — I25118 Atherosclerotic heart disease of native coronary artery with other forms of angina pectoris: Secondary | ICD-10-CM | POA: Diagnosis not present

## 2018-09-04 NOTE — Progress Notes (Signed)
Virtual Visit via Video Note   This visit type was conducted due to national recommendations for restrictions regarding the COVID-19 Pandemic (e.g. social distancing) in an effort to limit this patient's exposure and mitigate transmission in our community.  Due to his co-morbid illnesses, this patient is at least at moderate risk for complications without adequate follow up.  This format is felt to be most appropriate for this patient at this time.  All issues noted in this document were discussed and addressed.  A limited physical exam was performed with this format.  Please refer to the patient's chart for his consent to telehealth for [facility].   Date:  09/04/2018   ID:  Renee Rival, DOB Jun 29, 1950, MRN [id number]  Patient Location: Home Provider Location: Office  PCP:  [name], MD  Cardiologist:  [name], MD  Electrophysiologist:  None   Evaluation Performed:  Follow-Up Visit  Chief Complaint: Neck pain  History of Present Illness:    Willie Clark is a 68 y.o. male was seen via video visit for follow-up regarding recent hospitalization.  He has known history of coronary artery disease. He is status post coronary artery bypass graft surgery in 1993 and redo in 2007.   He suffers from fibromyalgia and chronic pain.  He had GI symptoms in 2015 due to documented gastric ulcer. He had neck surgery in 5465 without complications.  Cardiac catheterization in June, 2017 showed significant underlying three-vessel disease with patent grafts and no change in coronary anatomy since most recent cardiac catheterization. Ejection fraction was 50-55% with normal left ventricular end-diastolic pressure.  He has chronic dizziness due to orthostatic hypotension that has improved after stopping metoprolol.     Lexiscan Myoview in August 2019  showed mild ischemia in the distal anterior wall.  The test was not different from his prior Myoview.   He was hospitalized last  month with atypical chest pain and neck pain.  He was given morphine nitroglycerin in the emergency room but dropped his blood pressure to 54/40 with brief unresponsiveness.  CT of the chest was unremarkable.  Troponin was negative.  His chest pain was felt to be musculoskeletal.  Echocardiogram showed an EF of 40 to 45%.  The patient reports significant neck pain radiating down to his chest with numbness.  He has been told about significant arthritis in his neck in the past and did improve previously with steroid injection.  The patient does not have symptoms concerning for COVID-19 infection (fever, chills, cough, or new shortness of breath).    Past Medical History:  Diagnosis Date  . (HFpEF) heart failure with preserved ejection fraction ([place])    a. 09/2016 Echo: EF 55-60%, no rwma, triv AI, mild MR, mildly dil LA.  [name] Anxiety   . Arthritis   . Barrett's esophagus   . Bowel obstruction ([place]) 02/2009   small bowel  . Chronic chest pain   . Coronary artery disease    a. 1993 CABG; b. 2007 Redo CABG; c. 09/2015 Cath: LM mild dzs, LAD 100ost, RI mild dzs, LCX 60p, OM1 100, RCA 100ost, VG->OM2 min irregs, LIMA->LAD nl, VG->RPDA 30ost, 20d, nl EF->Med rx.  . Depression   . Diverticulitis   . Diverticulosis   . Erectile dysfunction   . Fibromyalgia   . Gastric ulcer   . Gastritis   . GERD (gastroesophageal reflux disease)   . Headache   . Hemorrhoids   . Hyperlipidemia   . Hypertension   .  Kidney stones    going to [facility]  June 26 to see kidney specialist  . Lightheadedness   . Mass on back   . MI (myocardial infarction) ([place])   . Nephrolithiasis   . Orthostatic hypotension    a. Improved after discontinuation of metoprolol.  . OSA (obstructive sleep apnea)    a. On CPAP.  [name] Pulmonary embolism ([place]) 01/2006   Post-op/treated  . Skin lesions, generalized   . Stroke [facility]) 1993   right side weakness no blood thinners   on Aspirin  . Syncope and collapse   . Urticaria     Past Surgical History:  Procedure Laterality Date  . BACK SURGERY    . bowel obstruction  01/2009  . CARDIAC CATHETERIZATION  11/2006   patent grafts. Significant OM3 disease and occluded diagonals.  [name] CARDIAC CATHETERIZATION  06/2010   patent grafts. significant ISR in proximal LCX but OM2 is bypassed and gives retrograde flow to OM3.   [name] CARDIAC CATHETERIZATION  06/2013   [facility]: Patent grafts with 60% proximal in-stent restenosis in the left circumflex with FFR of 0.85  . CARDIAC CATHETERIZATION Left 09/25/2015   Procedure: Left Heart Cath and Cors/Grafts Angiography;  Surgeon: Wellington Hampshire, MD;  Location: [facility] [place] CV LAB;  Service: Cardiovascular;  Laterality: Left;  . CERVICAL FUSION    . CHOLECYSTECTOMY    . COLON SURGERY  01/2009   BOWEL RESECTION DUE TO SMALL BOWEL OBSTRUCTION  . COLONOSCOPY    . COLONOSCOPY WITH PROPOFOL N/A 10/28/2017   Procedure: COLONOSCOPY WITH PROPOFOL;  Surgeon: Lollie Sails, MD;  Location: [facility] ENDOSCOPY;  Service: Endoscopy;  Laterality: N/A;  . CORONARY ANGIOPLASTY  2006  . CORONARY ARTERY BYPASS GRAFT  1993/01/2006   redo at [facility]. LIMA to LAD, SVG to OM2 and SVG to RPDA  . CYSTOSCOPY WITH STENT PLACEMENT Bilateral 09/24/2015   Procedure: CYSTOSCOPY WITH BILATERAL RETROGRADES, BILATERAL STENT PLACEMENT;  Surgeon: Festus Aloe, MD;  Location: [facility] ORS;  Service: Urology;  Laterality: Bilateral;  . ESOPHAGOGASTRODUODENOSCOPY N/A 10/25/2014   Procedure: ESOPHAGOGASTRODUODENOSCOPY (EGD);  Surgeon: Lollie Sails, MD;  Location: [facility] ENDOSCOPY;  Service: Endoscopy;  Laterality: N/A;  . ESOPHAGOGASTRODUODENOSCOPY (EGD) WITH PROPOFOL N/A 02/03/2017   Procedure: ESOPHAGOGASTRODUODENOSCOPY (EGD) WITH PROPOFOL;  Surgeon: Lollie Sails, MD;  Location: [facility] ENDOSCOPY;  Service: Endoscopy;  Laterality: N/A;  . ESOPHAGOGASTRODUODENOSCOPY (EGD) WITH PROPOFOL N/A 04/18/2017   Procedure: ESOPHAGOGASTRODUODENOSCOPY (EGD) WITH PROPOFOL;  Surgeon: Lollie Sails, MD;  Location: [facility] ENDOSCOPY;  Service: Endoscopy;  Laterality: N/A;  . EYE SURGERY Bilateral    Cataract Extraction with IOL  . HERNIA REPAIR    . INGUINAL HERNIA REPAIR Bilateral 01/23/2016   Procedure: HERNIA REPAIR INGUINAL ADULT BILATERAL;  Surgeon: Leonie Green, MD;  Location: [facility] ORS;  Service: General;  Laterality: Bilateral;  . LUMBAR LAMINECTOMY/DECOMPRESSION MICRODISCECTOMY Left 10/01/2016   Procedure: Left Lumbar four-five Laminotomy for resection of synovial cyst;  Surgeon: Erline Levine, MD;  Location: [facility];  Service: Neurosurgery;  Laterality: Left;  left  . NECK SURGERY  06/2009  . SHOULDER SURGERY  2010     Current Meds  Medication Sig  . acetaminophen (TYLENOL) 500 MG tablet Take 500 mg by mouth every 8 (eight) hours as needed for mild pain.   [name] albuterol (PROVENTIL HFA;VENTOLIN HFA) 108 (90 Base) MCG/ACT inhaler Inhale into the lungs every 4 (four) hours as needed for wheezing or shortness of breath.  [name] aspirin EC 81 MG tablet Take 81  mg by mouth daily.  . citalopram (CELEXA) 20 MG tablet Take 20 mg by mouth daily.   [name] docusate sodium (COLACE) 100 MG capsule Take 100 mg by mouth 2 (two) times daily.  . fexofenadine (ALLEGRA) 180 MG tablet Take 180 mg by mouth daily.  [name] FIBER PO Take 1 tablet by mouth daily.   . finasteride (PROSCAR) 5 MG tablet Take 5 mg by mouth daily.  . furosemide (LASIX) 40 MG tablet Take 40 mg by mouth daily as needed for fluid.  [name] gabapentin (NEURONTIN) 800 MG tablet Take 800 mg by mouth 3 (three) times daily.  [name] ibuprofen (ADVIL,MOTRIN) 600 MG tablet Take 1 tablet (600 mg total) by mouth every 8 (eight) hours as needed for moderate pain or cramping.  . indapamide (LOZOL) 2.5 MG tablet Take 2.5 mg by mouth daily.  . Methylcellulose, Laxative, 500 MG TABS Take 1 tablet by mouth daily.  . Multiple Vitamins-Minerals (HAIR/SKIN/NAILS PO) Take 1 tablet by mouth 3 (three) times daily.   . nitroGLYCERIN (NITROSTAT) 0.4 MG SL tablet Place  0.4 mg under the tongue every 5 (five) minutes as needed for chest pain.   . pantoprazole (PROTONIX) 40 MG tablet Take 40 mg by mouth 2 (two) times daily.   . potassium chloride SA (K-DUR,KLOR-CON) 20 MEQ tablet Take 20 mEq by mouth daily.  . psyllium (REGULOID) 0.52 g capsule Take 0.52 g by mouth daily.  [name] Spacer/Aero-Holding Chambers (AEROCHAMBER MINI CHAMBER) DEVI by Does not apply route as directed.  . triamcinolone ointment (KENALOG) 0.1 % Apply 1 application topically 2 (two) times daily.   . vitamin B-12 (CYANOCOBALAMIN) 1000 MCG tablet Take 1,000 mcg by mouth daily.     Allergies:   Amitriptyline; Berniece Salines flavor; Cyclobenzaprine; Fish oil; Lidocaine; Other; and Percocet [oxycodone-acetaminophen]   Social History   Tobacco Use  . Smoking status: Former Smoker    Packs/day: 2.50    Types: Cigarettes    Last attempt to quit: 08/21/1991    Years since quitting: 27.0  . Smokeless tobacco: Never Used  Substance Use Topics  . Alcohol use: Not Currently  . Drug use: No     Family Hx: The patient's family history includes Heart attack in his father; Heart disease (age of onset: 102) in his father.  ROS:   Please see the history of present illness.     All other systems reviewed and are negative.   Prior CV studies:   The following studies were reviewed today:  Reviewed recent events of his hospitalization.  Labs/Other Tests and Data Reviewed:    EKG:  No ECG reviewed.  Recent Labs: 08/01/2018: BUN 12; Creatinine, Ser 0.81; Hemoglobin 13.5; Magnesium 2.0; Platelets 145; Potassium 4.2; Sodium 141   Recent Lipid Panel Lab Results  Component Value Date/Time   CHOL 154 08/01/2018 05:15 AM   TRIG 285 (H) 08/01/2018 05:15 AM   HDL 41 08/01/2018 05:15 AM   CHOLHDL 3.8 08/01/2018 05:15 AM   LDLCALC 56 08/01/2018 05:15 AM    Wt Readings from Last 3 Encounters:  09/04/18 187 lb 4 oz (84.9 kg)  08/01/18 196 lb 6.4 oz (89.1 kg)  02/24/18 198 lb (89.8 kg)     Objective:     Vital Signs:  BP (!) 165/88 Comment: checked at 10:00 am  Pulse 72   Ht 5\' 9"  (1.753 m)   Wt 187 lb 4 oz (84.9 kg)   BMI 27.65 kg/m    VITAL SIGNS:  reviewed GEN:  no acute distress  EYES:  sclerae anicteric, EOMI - Extraocular Movements Intact RESPIRATORY:  normal respiratory effort, symmetric expansion CARDIOVASCULAR:  no peripheral edema SKIN:  no rash, lesions or ulcers. MUSCULOSKELETAL:  no obvious deformities. NEURO:  alert and oriented x 3, no obvious focal deficit PSYCH:  normal affect  ASSESSMENT & PLAN:    1.  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  He is going to follow-up with neurosurgery about that.  2. Essential hypertension: He is off antihypertensive medications due to symptomatic hypotension.  He is not hypertensive but this seems to be triggered by pain.  I elected not to put him back on enalapril.  Continue to monitor.  3. Chronic venous insufficiency:  He is currently on indapamide.  4. Hyperlipidemia: Continue current dose of rosuvastatin with a target LDL of less than 70.  5. Mild carotid disease: Previous carotid Doppler showed less than 40% stenosis bilaterally. No need to repeat.    COVID-19 Education: The signs and symptoms of COVID-19 were discussed with the patient and how to seek care for testing (follow up with PCP or arrange E-visit).  The importance of social distancing was discussed today.  Time:   Today, I have spent 16 minutes with the patient with telehealth technology discussing the above problems.     Medication Adjustments/Labs and Tests Ordered: Current medicines are reviewed at length with the patient today.  Concerns regarding medicines are outlined above.   Tests Ordered: No orders of the defined types were placed in this encounter.   Medication Changes: No orders of the defined types were placed  in this encounter.   Disposition:  Follow up in 3 month(s)  Signed, [name], MD  09/04/2018 11:51 AM     [facility]

## 2018-09-04 NOTE — Patient Instructions (Signed)
Medication Instructions:  Continue same medications If you need a refill on your cardiac medications before your next appointment, please call your pharmacy.   Lab work: None  Testing/Procedures: None  Follow-Up: At [facility], you and your health needs are our priority.  As part of our continuing mission to provide you with exceptional heart care, we have created designated Provider Care Teams.  These Care Teams include your primary Cardiologist (physician) and Advanced Practice Providers (APPs -  Physician Assistants and Nurse Practitioners) who all work together to provide you with the care you need, when you need it. You will need a follow up appointment in 3 months.  Please call our office 2 months in advance to schedule this appointment.  You may see [name], MD or one of the following Advanced Practice Providers on your designated Care Team:   Murray Hodgkins, NP Christell Faith, PA-C . Marrianne Mood, PA-C

## 2018-09-29 ENCOUNTER — Other Ambulatory Visit: Payer: Self-pay | Admitting: Neurosurgery

## 2018-09-29 DIAGNOSIS — M5412 Radiculopathy, cervical region: Secondary | ICD-10-CM

## 2018-10-01 ENCOUNTER — Other Ambulatory Visit: Payer: Self-pay

## 2018-10-01 ENCOUNTER — Ambulatory Visit
Admission: RE | Admit: 2018-10-01 | Discharge: 2018-10-01 | Disposition: A | Discharge status: Home / Self Care | Payer: Medicare HMO | Source: Ambulatory Visit | Attending: Neurosurgery | Admitting: Neurosurgery

## 2018-10-01 DIAGNOSIS — M5412 Radiculopathy, cervical region: Secondary | ICD-10-CM

## 2018-12-03 ENCOUNTER — Ambulatory Visit: Payer: Commercial Managed Care - HMO | Admitting: Podiatry

## 2018-12-03 ENCOUNTER — Encounter: Payer: Self-pay | Admitting: Podiatry

## 2018-12-03 ENCOUNTER — Other Ambulatory Visit: Payer: Self-pay

## 2018-12-03 DIAGNOSIS — L603 Nail dystrophy: Secondary | ICD-10-CM | POA: Diagnosis not present

## 2018-12-03 NOTE — Progress Notes (Signed)
This patient presents the office with chief complaint of a deformed nail big toe left foot.  He says that about 2 years ago he injured the nail and the nail was removed .  He also says there was an infection noted at the site of the nail.  The nail was removed by Dr. Jacqualyn Posey.  He presents the office stating he is having no pain or discomfort in the nail today  .  He says that his wife recommended that he have this toenail evaluated and treated.  He presents the office today for an evaluation and treatment of this nail left big toe.  Vascular  Dorsalis pedis and posterior tibial pulses are palpable  B/L.  Capillary return  WNL.  Temperature gradient is  WNL.  Skin turgor  WNL  Sensorium  Senn Weinstein monofilament wire  WNL. Normal tactile sensation.  Nail Exam  Patient has normal nails with no evidence of bacterial or fungal infection all nails except left hallux.  His left hallux nail is thick disfigured and discolored.  No drainage or infection noted.  Orthopedic  Exam  Muscle tone and muscle strength  WNL.  No limitations of motion feet  B/L.  No crepitus or joint effusion noted.  Foot type is unremarkable and digits show no abnormalities.  Bony prominences are unremarkable.  Skin  No open lesions.  Normal skin texture and turgor.  Nail Dystrophy left hallux.  ROV.  Discussed this condition with this patient.  Told him that following an injury the nail frequently grows thick and disfigured.  Since this patient is pain-free I recommended that I trim the nail with the dremel  today and he can consider surgery for the permanent removal in the future. RTC prn.   Gardiner Barefoot DPM

## 2019-04-09 ENCOUNTER — Other Ambulatory Visit: Payer: Self-pay | Admitting: Neurology

## 2019-04-09 DIAGNOSIS — R413 Other amnesia: Secondary | ICD-10-CM

## 2019-04-20 ENCOUNTER — Ambulatory Visit (HOSPITAL_COMMUNITY)
Admission: RE | Admit: 2019-04-20 | Discharge: 2019-04-20 | Disposition: A | Discharge status: Home / Self Care | Payer: Medicare HMO | Source: Ambulatory Visit | Attending: Neurology | Admitting: Neurology

## 2019-04-20 ENCOUNTER — Other Ambulatory Visit: Payer: Self-pay

## 2019-04-20 DIAGNOSIS — R413 Other amnesia: Secondary | ICD-10-CM

## 2019-09-14 ENCOUNTER — Ambulatory Visit: Payer: Medicare HMO | Admitting: Cardiovascular Disease

## 2019-09-14 ENCOUNTER — Other Ambulatory Visit: Payer: Self-pay

## 2019-09-14 ENCOUNTER — Encounter: Payer: Self-pay | Admitting: Cardiovascular Disease

## 2019-09-14 VITALS — BP 140/80 | HR 66 | Ht 69.0 in | Wt 191.0 lb

## 2019-09-14 DIAGNOSIS — I10 Essential (primary) hypertension: Secondary | ICD-10-CM

## 2019-09-14 DIAGNOSIS — E785 Hyperlipidemia, unspecified: Secondary | ICD-10-CM | POA: Diagnosis not present

## 2019-09-14 DIAGNOSIS — I25708 Atherosclerosis of coronary artery bypass graft(s), unspecified, with other forms of angina pectoris: Secondary | ICD-10-CM

## 2019-09-14 NOTE — Patient Instructions (Signed)
Medication Instructions:  Your physician recommends that you continue on your current medications as directed. Please refer to the Current Medication list given to you today.  *If you need a refill on your cardiac medications before your next appointment, please call your pharmacy*   Lab Work: None ordered If you have labs (blood work) drawn today and your tests are completely normal, you will receive your results only by: . MyChart Message (if you have MyChart) OR . A paper copy in the mail If you have any lab test that is abnormal or we need to change your treatment, we will call you to review the results.   Testing/Procedures: None ordered   Follow-Up: At [facility], you and your health needs are our priority.  As part of our continuing mission to provide you with exceptional heart care, we have created designated Provider Care Teams.  These Care Teams include your primary Cardiologist (physician) and Advanced Practice Providers (APPs -  Physician Assistants and Nurse Practitioners) who all work together to provide you with the care you need, when you need it.  We recommend signing up for the patient portal called "MyChart".  Sign up information is provided on this After Visit Summary.  MyChart is used to connect with patients for Virtual Visits (Telemedicine).  Patients are able to view lab/test results, encounter notes, upcoming appointments, etc.  Non-urgent messages can be sent to your provider as well.   To learn more about what you can do with MyChart, go to [email or web address].    Your next appointment:   12 month(s)  The format for your next appointment:   In Person  Provider:    You may see Muhammad Arida, MD or one of the following Advanced Practice Providers on your designated Care Team:    Christopher Berge, NP  Ryan Dunn, PA-C  Jacquelyn Visser, PA-C    Other Instructions N/A  

## 2019-09-14 NOTE — Progress Notes (Signed)
Cardiology Office Note   Date:  09/14/2019   ID:  [name], DOB 1950-07-12, MRN [id number]  PCP:  [name], MD  Cardiologist:   [name], MD   Chief Complaint  Patient presents with  . Other    3 month follow up. Patient c.o some chest pain, swelling in ankles, and SOB. Meds reviewed verbally with patient.       History of Present Illness: Willie Clark is a 69 y.o. male who presents for regarding coronary artery disease.  He has known history of coronary artery disease. He is status post coronary artery bypass graft surgery in 1993 and redo in 2007.   He suffers from fibromyalgia and chronic pain.  He had GI symptoms in 2015 due to documented gastric ulcer. He had neck surgery in [id number] without complications.  Cardiac catheterization in June, 2017 showed significant underlying three-vessel disease with patent grafts and no change in coronary anatomy since most recent cardiac catheterization. Ejection fraction was 50-55% with normal left ventricular end-diastolic pressure.  He has chronic dizziness due to orthostatic hypotension that has improved after stopping metoprolol.    Lexiscan Myoview in August 2019  showed mild ischemia in the distal anterior wall.  The test was not different from his prior Myoview.   He has known history of chronic neck pain with 2 previous cervical spine surgeries.  Fortunately, he has been stable from a cardiac standpoint with no worsening chest pain or shortness of breath.  He continues to struggle with migraine headaches and chronic pain overall. No recent hospitalizations.     Past Medical History:  Diagnosis Date  . (HFpEF) heart failure with preserved ejection fraction (Willie Clark)    a. 09/2016 Echo: EF 55-60%, no rwma, triv AI, mild MR, mildly dil LA.  [name] Anxiety   . Arthritis   . Barrett's esophagus   . Bowel obstruction ([place]) 02/2009   small bowel  . Chronic chest pain   . Coronary artery disease    a. 1993 CABG; b. 2007  Redo CABG; c. 09/2015 Cath: LM mild dzs, LAD 100ost, RI mild dzs, LCX 60p, OM1 100, RCA 100ost, VG->OM2 min irregs, LIMA->LAD nl, VG->RPDA 30ost, 20d, nl EF->Med rx.  . Depression   . Diverticulitis   . Diverticulosis   . Erectile dysfunction   . Fibromyalgia   . Gastric ulcer   . Gastritis   . GERD (gastroesophageal reflux disease)   . Headache   . Hemorrhoids   . Hyperlipidemia   . Hypertension   . Kidney stones    going to [facility]  June 26 to see kidney specialist  . Lightheadedness   . Mass on back   . MI (myocardial infarction) ([place])   . Nephrolithiasis   . Orthostatic hypotension    a. Improved after discontinuation of metoprolol.  . OSA (obstructive sleep apnea)    a. On CPAP.  [name] Pulmonary embolism ([place]) 01/2006   Post-op/treated  . Skin lesions, generalized   . Stroke [facility]) 1993   right side weakness no blood thinners   on Aspirin  . Syncope and collapse   . Urticaria     Past Surgical History:  Procedure Laterality Date  . BACK SURGERY    . bowel obstruction  01/2009  . CARDIAC CATHETERIZATION  11/2006   patent grafts. Significant OM3 disease and occluded diagonals.  [name] CARDIAC CATHETERIZATION  06/2010   patent grafts. significant ISR in proximal LCX but OM2 is bypassed and  gives retrograde flow to OM3.   [name] CARDIAC CATHETERIZATION  06/2013   [facility]: Patent grafts with 60% proximal in-stent restenosis in the left circumflex with FFR of 0.85  . CARDIAC CATHETERIZATION Left 09/25/2015   Procedure: Left Heart Cath and Cors/Grafts Angiography;  Surgeon: Wellington Hampshire, MD;  Location: [place] [facility];  Service: Cardiovascular;  Laterality: Left;  . CERVICAL FUSION    . CHOLECYSTECTOMY    . COLON SURGERY  01/2009   BOWEL RESECTION DUE TO SMALL BOWEL OBSTRUCTION  . COLONOSCOPY    . COLONOSCOPY WITH PROPOFOL N/A 10/28/2017   Procedure: COLONOSCOPY WITH PROPOFOL;  Surgeon: Lollie Sails, MD;  Location: [facility] ENDOSCOPY;  Service: Endoscopy;  Laterality: N/A;  .  CORONARY ANGIOPLASTY  2006  . CORONARY ARTERY BYPASS GRAFT  1993/01/2006   redo at [facility]. LIMA to LAD, SVG to OM2 and SVG to RPDA  . CYSTOSCOPY WITH STENT PLACEMENT Bilateral 09/24/2015   Procedure: CYSTOSCOPY WITH BILATERAL RETROGRADES, BILATERAL STENT PLACEMENT;  Surgeon: Festus Aloe, MD;  Location: [facility] ORS;  Service: Urology;  Laterality: Bilateral;  . ESOPHAGOGASTRODUODENOSCOPY N/A 10/25/2014   Procedure: ESOPHAGOGASTRODUODENOSCOPY (EGD);  Surgeon: Lollie Sails, MD;  Location: [facility] ENDOSCOPY;  Service: Endoscopy;  Laterality: N/A;  . ESOPHAGOGASTRODUODENOSCOPY (EGD) WITH PROPOFOL N/A 02/03/2017   Procedure: ESOPHAGOGASTRODUODENOSCOPY (EGD) WITH PROPOFOL;  Surgeon: Lollie Sails, MD;  Location: [facility] ENDOSCOPY;  Service: Endoscopy;  Laterality: N/A;  . ESOPHAGOGASTRODUODENOSCOPY (EGD) WITH PROPOFOL N/A 04/18/2017   Procedure: ESOPHAGOGASTRODUODENOSCOPY (EGD) WITH PROPOFOL;  Surgeon: Lollie Sails, MD;  Location: [facility] ENDOSCOPY;  Service: Endoscopy;  Laterality: N/A;  . EYE SURGERY Bilateral    Cataract Extraction with IOL  . HERNIA REPAIR    . INGUINAL HERNIA REPAIR Bilateral 01/23/2016   Procedure: HERNIA REPAIR INGUINAL ADULT BILATERAL;  Surgeon: Leonie Green, MD;  Location: [facility] ORS;  Service: General;  Laterality: Bilateral;  . LUMBAR LAMINECTOMY/DECOMPRESSION MICRODISCECTOMY Left 10/01/2016   Procedure: Left Lumbar four-five Laminotomy for resection of synovial cyst;  Surgeon: Erline Levine, MD;  Location: [place];  Service: Neurosurgery;  Laterality: Left;  left  . NECK SURGERY  06/2009  . SHOULDER SURGERY  2010     Current Outpatient Medications  Medication Sig Dispense Refill  . acetaminophen (TYLENOL) 500 MG tablet Take 500 mg by mouth every 8 (eight) hours as needed for mild pain.     [name] albuterol (PROVENTIL HFA;VENTOLIN HFA) 108 (90 Base) MCG/ACT inhaler Inhale into the lungs every 4 (four) hours as needed for wheezing or shortness of breath.    [name] aspirin EC  81 MG tablet Take 81 mg by mouth daily.    . citalopram (CELEXA) 20 MG tablet Take 20 mg by mouth daily.     [name] docusate sodium (COLACE) 100 MG capsule Take 100 mg by mouth 2 (two) times daily.    . fexofenadine (ALLEGRA) 180 MG tablet Take 180 mg by mouth daily.    [name] FIBER PO Take 1 tablet by mouth daily.     . finasteride (PROSCAR) 5 MG tablet Take 5 mg by mouth daily.    . furosemide (LASIX) 40 MG tablet Take 40 mg by mouth daily as needed for fluid.    [name] gabapentin (NEURONTIN) 800 MG tablet Take 800 mg by mouth 3 (three) times daily.    . indapamide (LOZOL) 2.5 MG tablet Take 2.5 mg by mouth daily.  11  . Methylcellulose, Laxative, 500 MG TABS Take 1 tablet by mouth daily.    . Multiple  Vitamins-Minerals (HAIR/SKIN/NAILS PO) Take 1 tablet by mouth 3 (three) times daily.     . nitroGLYCERIN (NITROSTAT) 0.4 MG SL tablet Place 0.4 mg under the tongue every 5 (five) minutes as needed for chest pain.     . pantoprazole (PROTONIX) 40 MG tablet Take 40 mg by mouth 2 (two) times daily.     . potassium chloride SA (K-DUR,KLOR-CON) 20 MEQ tablet Take 20 mEq by mouth daily.    . psyllium (REGULOID) 0.52 g capsule Take 0.52 g by mouth daily.    . rosuvastatin (CRESTOR) 20 MG tablet Take 20 mg by mouth daily.    [name] Spacer/Aero-Holding Chambers (AEROCHAMBER MINI CHAMBER) DEVI by Does not apply route as directed.    . triamcinolone ointment (KENALOG) 0.1 % Apply 1 application topically 2 (two) times daily.     . vitamin B-12 (CYANOCOBALAMIN) 1000 MCG tablet Take 1,000 mcg by mouth daily.     No current facility-administered medications for this visit.    Allergies:   Amitriptyline, Cyclobenzaprine, Bacon flavor, Fish oil, Lidocaine, Omega-3 fatty acids-vitamin e, Other, and Percocet [oxycodone-acetaminophen]    Social History:  The patient  reports that he quit smoking about 28 years ago. His smoking use included cigarettes. He smoked 2.50 packs per day. He has never used smokeless tobacco. He reports  previous alcohol use. He reports that he does not use drugs.   Family History:  The patient's family history includes Heart attack in his father; Heart disease (age of onset: 45) in his father.    ROS:  Please see the history of present illness.   Otherwise, review of systems are positive for none.   All other systems are reviewed and negative.    PHYSICAL EXAM: VS:  BP 140/80 (BP Location: Left Arm, Patient Position: Sitting, Cuff Size: Normal)   Pulse 66   Ht 5\' 9"  (1.753 m)   Wt 191 lb (86.6 kg)   SpO2 96%   BMI 28.21 kg/m  , BMI Body mass index is 28.21 kg/m. GEN: Well nourished, well developed, in no acute distress  HEENT: normal  Neck: no JVD, carotid bruits, or masses Cardiac: RRR; no murmurs, rubs, or gallops,no edema  Respiratory:  clear to auscultation bilaterally, normal work of breathing GI: soft, nontender, nondistended, + BS MS: no deformity or atrophy  Skin: warm and dry, no rash Neuro:  Strength and sensation are intact Psych: euthymic mood, full affect   EKG:  EKG is   ordered today. EKG showed normal sinus rhythm with nonspecific T wave changes.   Recent Labs: No results found for requested labs within last 8760 hours.    Lipid Panel    Component Value Date/Time   CHOL 154 08/01/2018 0515   TRIG 285 (H) 08/01/2018 0515   HDL 41 08/01/2018 0515   CHOLHDL 3.8 08/01/2018 0515   VLDL 57 (H) 08/01/2018 0515   LDLCALC 56 08/01/2018 0515      Wt Readings from Last 3 Encounters:  09/14/19 191 lb (86.6 kg)  09/04/18 187 lb 4 oz (84.9 kg)  08/01/18 196 lb 6.4 oz (89.1 kg)         ASSESSMENT AND PLAN:  1.  Coronary artery disease involving native coronary arteries with chronic angina: He is overall stable with no worsening symptoms.  Continue medical therapy.  2. Essential hypertension: Blood pressure is controlled.  3. Chronic venous insufficiency:  He is currently on indapamide.  4. Hyperlipidemia: Continue current dose of rosuvastatin  with a target LDL  of less than 70.  5. Mild carotid disease: Previous carotid Doppler showed less than 40% stenosis bilaterally. No need to repeat.   Disposition:   FU with me in 12 months  Signed,  [name], MD  09/14/2019 3:05 PM    [facility]

## 2019-11-10 ENCOUNTER — Ambulatory Visit: Payer: Medicare HMO | Admitting: Internal Medicine

## 2019-12-11 ENCOUNTER — Other Ambulatory Visit: Payer: Self-pay

## 2019-12-11 ENCOUNTER — Emergency Department: Payer: Medicare HMO

## 2019-12-11 DIAGNOSIS — I509 Heart failure, unspecified: Secondary | ICD-10-CM | POA: Diagnosis not present

## 2019-12-11 DIAGNOSIS — I5042 Chronic combined systolic (congestive) and diastolic (congestive) heart failure: Secondary | ICD-10-CM | POA: Insufficient documentation

## 2019-12-11 DIAGNOSIS — Z79899 Other long term (current) drug therapy: Secondary | ICD-10-CM | POA: Insufficient documentation

## 2019-12-11 DIAGNOSIS — Z87891 Personal history of nicotine dependence: Secondary | ICD-10-CM | POA: Insufficient documentation

## 2019-12-11 DIAGNOSIS — Z7982 Long term (current) use of aspirin: Secondary | ICD-10-CM | POA: Insufficient documentation

## 2019-12-11 DIAGNOSIS — I110 Hypertensive heart disease with heart failure: Secondary | ICD-10-CM | POA: Insufficient documentation

## 2019-12-11 DIAGNOSIS — Z20822 Contact with and (suspected) exposure to covid-19: Secondary | ICD-10-CM | POA: Diagnosis not present

## 2019-12-11 DIAGNOSIS — E876 Hypokalemia: Secondary | ICD-10-CM | POA: Diagnosis not present

## 2019-12-11 DIAGNOSIS — Z8673 Personal history of transient ischemic attack (TIA), and cerebral infarction without residual deficits: Secondary | ICD-10-CM | POA: Insufficient documentation

## 2019-12-11 DIAGNOSIS — R079 Chest pain, unspecified: Principal | ICD-10-CM | POA: Insufficient documentation

## 2019-12-11 DIAGNOSIS — I25111 Atherosclerotic heart disease of native coronary artery with angina pectoris with documented spasm: Secondary | ICD-10-CM | POA: Diagnosis not present

## 2019-12-11 DIAGNOSIS — F329 Major depressive disorder, single episode, unspecified: Secondary | ICD-10-CM | POA: Insufficient documentation

## 2019-12-11 LAB — CBC
HCT: 42.7 % (ref 39.0–52.0)
Hemoglobin: 14.4 g/dL (ref 13.0–17.0)
MCH: 33.4 pg (ref 26.0–34.0)
MCHC: 33.7 g/dL (ref 30.0–36.0)
MCV: 99.1 fL (ref 80.0–100.0)
Platelets: 167 10*3/uL (ref 150–400)
RBC: 4.31 MIL/uL (ref 4.22–5.81)
RDW: 13.1 % (ref 11.5–15.5)
WBC: 6.9 10*3/uL (ref 4.0–10.5)
nRBC: 0 % (ref 0.0–0.2)

## 2019-12-11 LAB — COMPREHENSIVE METABOLIC PANEL
ALT: 36 U/L (ref 0–44)
AST: 45 U/L — ABNORMAL HIGH (ref 15–41)
Albumin: 4.6 g/dL (ref 3.5–5.0)
Alkaline Phosphatase: 51 U/L (ref 38–126)
Anion gap: 13 (ref 5–15)
BUN: 14 mg/dL (ref 8–23)
CO2: 30 mmol/L (ref 22–32)
Calcium: 9 mg/dL (ref 8.9–10.3)
Chloride: 95 mmol/L — ABNORMAL LOW (ref 98–111)
Creatinine, Ser: 0.87 mg/dL (ref 0.61–1.24)
GFR calc Af Amer: >60 mL/min (ref >60)
GFR calc non Af Amer: >60 mL/min (ref >60)
Glucose, Bld: 113 mg/dL — ABNORMAL HIGH (ref 70–99)
Potassium: 2.7 mmol/L — CL (ref 3.5–5.1)
Sodium: 138 mmol/L (ref 135–145)
Total Bilirubin: 1 mg/dL (ref 0.3–1.2)
Total Protein: 7.1 g/dL (ref 6.5–8.1)

## 2019-12-11 LAB — TROPONIN I (HIGH SENSITIVITY)
Troponin I (High Sensitivity): 6 ng/L (ref <18)
Troponin I (High Sensitivity): 8 ng/L (ref <18)

## 2019-12-11 MED ORDER — POTASSIUM CHLORIDE CRYS ER 20 MEQ PO TBCR
40.0000 meq | EXTENDED_RELEASE_TABLET | Freq: Once | ORAL | Status: AC
  Administered 2019-12-11: 40 meq via ORAL
  Filled 2019-12-11: qty 2

## 2019-12-11 MED ORDER — IOHEXOL 350 MG/ML SOLN
75.0000 mL | Freq: Once | INTRAVENOUS | Status: AC | PRN
  Administered 2019-12-11: 75 mL via INTRAVENOUS

## 2019-12-11 NOTE — ED Triage Notes (Signed)
Pt states he has been having chest pain x3 days with nausea- pt states that it's tightness and pressure- pt was given nitro and ASA by EMS- pt states something was on his EKG

## 2019-12-11 NOTE — ED Notes (Signed)
Patient transported to CT 

## 2019-12-12 ENCOUNTER — Observation Stay
Admission: EM | Admit: 2019-12-12 | Discharge: 2019-12-13 | Disposition: A | Discharge status: Home / Self Care | Payer: Medicare HMO | Attending: Internal Medicine | Admitting: Internal Medicine

## 2019-12-12 ENCOUNTER — Other Ambulatory Visit: Payer: Self-pay

## 2019-12-12 ENCOUNTER — Observation Stay (HOSPITAL_BASED_OUTPATIENT_CLINIC_OR_DEPARTMENT_OTHER)
Admit: 2019-12-12 | Discharge: 2019-12-12 | Disposition: A | Discharge status: Home / Self Care | Payer: Medicare HMO | Attending: Internal Medicine | Admitting: Internal Medicine

## 2019-12-12 ENCOUNTER — Encounter: Payer: Self-pay | Admitting: Internal Medicine

## 2019-12-12 DIAGNOSIS — I25119 Atherosclerotic heart disease of native coronary artery with unspecified angina pectoris: Secondary | ICD-10-CM | POA: Diagnosis present

## 2019-12-12 DIAGNOSIS — E876 Hypokalemia: Secondary | ICD-10-CM | POA: Diagnosis not present

## 2019-12-12 DIAGNOSIS — I25118 Atherosclerotic heart disease of native coronary artery with other forms of angina pectoris: Secondary | ICD-10-CM

## 2019-12-12 DIAGNOSIS — F329 Major depressive disorder, single episode, unspecified: Secondary | ICD-10-CM | POA: Diagnosis not present

## 2019-12-12 DIAGNOSIS — I340 Nonrheumatic mitral (valve) insufficiency: Secondary | ICD-10-CM | POA: Diagnosis not present

## 2019-12-12 DIAGNOSIS — I5042 Chronic combined systolic (congestive) and diastolic (congestive) heart failure: Secondary | ICD-10-CM

## 2019-12-12 DIAGNOSIS — I351 Nonrheumatic aortic (valve) insufficiency: Secondary | ICD-10-CM

## 2019-12-12 DIAGNOSIS — I2510 Atherosclerotic heart disease of native coronary artery without angina pectoris: Secondary | ICD-10-CM | POA: Diagnosis present

## 2019-12-12 DIAGNOSIS — R079 Chest pain, unspecified: Secondary | ICD-10-CM

## 2019-12-12 DIAGNOSIS — I5022 Chronic systolic (congestive) heart failure: Secondary | ICD-10-CM | POA: Diagnosis present

## 2019-12-12 DIAGNOSIS — F32A Depression, unspecified: Secondary | ICD-10-CM | POA: Diagnosis present

## 2019-12-12 DIAGNOSIS — I200 Unstable angina: Secondary | ICD-10-CM | POA: Diagnosis present

## 2019-12-12 HISTORY — DX: Old myocardial infarction: I25.2

## 2019-12-12 HISTORY — DX: Unspecified systolic (congestive) heart failure: I50.20

## 2019-12-12 LAB — BASIC METABOLIC PANEL
Anion gap: 10 (ref 5–15)
Anion gap: 11 (ref 5–15)
BUN: 11 mg/dL (ref 8–23)
BUN: 10 mg/dL (ref 8–23)
CO2: 25 mmol/L (ref 22–32)
CO2: 25 mmol/L (ref 22–32)
Calcium: 7.1 mg/dL — ABNORMAL LOW (ref 8.9–10.3)
Calcium: 8.5 mg/dL — ABNORMAL LOW (ref 8.9–10.3)
Chloride: 104 mmol/L (ref 98–111)
Chloride: 101 mmol/L (ref 98–111)
Creatinine, Ser: 0.57 mg/dL — ABNORMAL LOW (ref 0.61–1.24)
Creatinine, Ser: 0.68 mg/dL (ref 0.61–1.24)
GFR calc Af Amer: >60 mL/min (ref >60)
GFR calc Af Amer: >60 mL/min (ref >60)
GFR calc non Af Amer: >60 mL/min (ref >60)
GFR calc non Af Amer: >60 mL/min (ref >60)
Glucose, Bld: 86 mg/dL (ref 70–99)
Glucose, Bld: 136 mg/dL — ABNORMAL HIGH (ref 70–99)
Potassium: 2.4 mmol/L — CL (ref 3.5–5.1)
Potassium: 3.2 mmol/L — ABNORMAL LOW (ref 3.5–5.1)
Sodium: 139 mmol/L (ref 135–145)
Sodium: 137 mmol/L (ref 135–145)

## 2019-12-12 LAB — TROPONIN I (HIGH SENSITIVITY)
Troponin I (High Sensitivity): 6 ng/L (ref <18)
Troponin I (High Sensitivity): 5 ng/L (ref <18)

## 2019-12-12 LAB — SARS CORONAVIRUS 2 BY RT PCR (HOSPITAL ORDER, PERFORMED IN ~~LOC~~ HOSPITAL LAB): SARS Coronavirus 2: NEGATIVE

## 2019-12-12 LAB — MAGNESIUM: Magnesium: 2.7 mg/dL — ABNORMAL HIGH (ref 1.7–2.4)

## 2019-12-12 MED ORDER — LORATADINE 10 MG PO TABS
10.0000 mg | ORAL_TABLET | Freq: Every day | ORAL | Status: DC
  Administered 2019-12-12: 10 mg via ORAL
  Filled 2019-12-12: qty 1

## 2019-12-12 MED ORDER — POTASSIUM CHLORIDE CRYS ER 20 MEQ PO TBCR
40.0000 meq | EXTENDED_RELEASE_TABLET | Freq: Two times a day (BID) | ORAL | Status: AC
  Administered 2019-12-12 (×2): 40 meq via ORAL
  Filled 2019-12-12 (×2): qty 2

## 2019-12-12 MED ORDER — DOCUSATE SODIUM 100 MG PO CAPS
100.0000 mg | ORAL_CAPSULE | Freq: Two times a day (BID) | ORAL | Status: DC
  Administered 2019-12-12: 100 mg via ORAL
  Filled 2019-12-12 (×2): qty 1

## 2019-12-12 MED ORDER — ASPIRIN EC 81 MG PO TBEC
81.0000 mg | DELAYED_RELEASE_TABLET | Freq: Every day | ORAL | Status: DC
  Administered 2019-12-12 – 2019-12-13 (×2): 81 mg via ORAL
  Filled 2019-12-12: qty 1

## 2019-12-12 MED ORDER — ONDANSETRON HCL 4 MG/2ML IJ SOLN: 4.0000 mg | Freq: Four times a day (QID) | INTRAMUSCULAR | Status: DC | PRN

## 2019-12-12 MED ORDER — ALBUTEROL SULFATE (2.5 MG/3ML) 0.083% IN NEBU: 3.0000 mL | INHALATION_SOLUTION | Freq: Four times a day (QID) | RESPIRATORY_TRACT | Status: DC | PRN

## 2019-12-12 MED ORDER — AMLODIPINE BESYLATE 5 MG PO TABS
2.5000 mg | ORAL_TABLET | Freq: Every day | ORAL | Status: DC
  Administered 2019-12-12: 2.5 mg via ORAL
  Filled 2019-12-12: qty 1

## 2019-12-12 MED ORDER — PANTOPRAZOLE SODIUM 40 MG PO TBEC
40.0000 mg | DELAYED_RELEASE_TABLET | Freq: Two times a day (BID) | ORAL | Status: DC
  Administered 2019-12-12 (×2): 40 mg via ORAL
  Filled 2019-12-12 (×2): qty 1

## 2019-12-12 MED ORDER — PSYLLIUM 95 % PO PACK
1.0000 | PACK | Freq: Every day | ORAL | Status: DC
  Administered 2019-12-12: 1 via ORAL
  Filled 2019-12-12 (×2): qty 1

## 2019-12-12 MED ORDER — CITALOPRAM HYDROBROMIDE 20 MG PO TABS
20.0000 mg | ORAL_TABLET | Freq: Every day | ORAL | Status: DC
  Administered 2019-12-12: 20 mg via ORAL
  Filled 2019-12-12: qty 1

## 2019-12-12 MED ORDER — CALCIUM POLYCARBOPHIL 625 MG PO TABS
625.0000 mg | ORAL_TABLET | Freq: Every day | ORAL | Status: DC
  Administered 2019-12-12: 625 mg via ORAL
  Filled 2019-12-12 (×2): qty 1

## 2019-12-12 MED ORDER — POTASSIUM CHLORIDE 10 MEQ/100ML IV SOLN
INTRAVENOUS | Status: AC
  Administered 2019-12-12: 10 meq via INTRAVENOUS
  Filled 2019-12-12: qty 100

## 2019-12-12 MED ORDER — GABAPENTIN 600 MG PO TABS
600.0000 mg | ORAL_TABLET | Freq: Three times a day (TID) | ORAL | Status: DC
  Administered 2019-12-12 – 2019-12-13 (×4): 600 mg via ORAL
  Filled 2019-12-12 (×4): qty 1

## 2019-12-12 MED ORDER — TRIAMCINOLONE ACETONIDE 0.1 % EX OINT
1.0000 "application " | TOPICAL_OINTMENT | Freq: Two times a day (BID) | CUTANEOUS | Status: DC
  Filled 2019-12-12: qty 15

## 2019-12-12 MED ORDER — FINASTERIDE 5 MG PO TABS
5.0000 mg | ORAL_TABLET | Freq: Every day | ORAL | Status: DC
  Administered 2019-12-12: 5 mg via ORAL
  Filled 2019-12-12 (×2): qty 1

## 2019-12-12 MED ORDER — ACETAMINOPHEN 500 MG PO TABS
500.0000 mg | ORAL_TABLET | Freq: Three times a day (TID) | ORAL | Status: DC | PRN
  Administered 2019-12-12: 500 mg via ORAL
  Filled 2019-12-12: qty 1

## 2019-12-12 MED ORDER — VITAMIN B-12 1000 MCG PO TABS
1000.0000 ug | ORAL_TABLET | Freq: Every day | ORAL | Status: DC
  Administered 2019-12-12: 1000 ug via ORAL
  Filled 2019-12-12 (×2): qty 1

## 2019-12-12 MED ORDER — MAGNESIUM SULFATE 2 GM/50ML IV SOLN
2.0000 g | Freq: Once | INTRAVENOUS | Status: AC
  Administered 2019-12-12: 2 g via INTRAVENOUS
  Filled 2019-12-12: qty 50

## 2019-12-12 MED ORDER — SODIUM CHLORIDE 0.9% FLUSH: 3.0000 mL | Freq: Two times a day (BID) | INTRAVENOUS | Status: DC

## 2019-12-12 MED ORDER — ROSUVASTATIN CALCIUM 20 MG PO TABS
20.0000 mg | ORAL_TABLET | Freq: Every day | ORAL | Status: DC
  Administered 2019-12-12: 20 mg via ORAL
  Filled 2019-12-12 (×2): qty 1

## 2019-12-12 MED ORDER — MORPHINE SULFATE (PF) 2 MG/ML IV SOLN
2.0000 mg | Freq: Once | INTRAVENOUS | Status: AC
  Administered 2019-12-12: 2 mg via INTRAVENOUS
  Filled 2019-12-12: qty 1

## 2019-12-12 MED ORDER — DICYCLOMINE HCL 10 MG PO CAPS
10.0000 mg | ORAL_CAPSULE | Freq: Three times a day (TID) | ORAL | Status: DC
  Administered 2019-12-12 – 2019-12-13 (×4): 10 mg via ORAL
  Filled 2019-12-12 (×5): qty 1

## 2019-12-12 MED ORDER — POTASSIUM CHLORIDE 10 MEQ/100ML IV SOLN
10.0000 meq | INTRAVENOUS | Status: AC
  Administered 2019-12-12 (×3): 10 meq via INTRAVENOUS
  Filled 2019-12-12 (×2): qty 100

## 2019-12-12 MED ORDER — NITROGLYCERIN 0.4 MG SL SUBL: 0.4000 mg | SUBLINGUAL_TABLET | SUBLINGUAL | Status: DC | PRN

## 2019-12-12 MED ORDER — ROPINIROLE HCL 1 MG PO TABS
1.0000 mg | ORAL_TABLET | Freq: Every day | ORAL | Status: DC
  Administered 2019-12-12: 1 mg via ORAL
  Filled 2019-12-12 (×2): qty 1

## 2019-12-12 MED ORDER — POTASSIUM CHLORIDE CRYS ER 20 MEQ PO TBCR
40.0000 meq | EXTENDED_RELEASE_TABLET | Freq: Once | ORAL | Status: AC
  Administered 2019-12-12: 40 meq via ORAL
  Filled 2019-12-12: qty 2

## 2019-12-12 MED ORDER — ENOXAPARIN SODIUM 40 MG/0.4ML ~~LOC~~ SOLN: 40.0000 mg | SUBCUTANEOUS | Status: DC

## 2019-12-12 NOTE — Consult Note (Signed)
Cardiology Consultation:   Patient ID: Willie Clark MRN: [id number]; DOB: 1950/07/02  Admit date: 12/12/2019 Date of Consult: 12/12/2019  Primary Care Provider: [name], MD [facility] Cardiologist: [name], MD  [facility] Electrophysiologist:  None   Referring Physician: [name], MD  Patient Profile:   Willie Clark is a 69 y.o. male with a hx of significant prior CAD/mild HFrEF and prior history of PE as documented below who is being seen today for the evaluation of CHEST PAIN/UNSTABLE ANGINA at the request of [name], [name], MD.  History of Present Illness:   [name] gentleman with a history of longstanding CAD dating back to 56 (initial CABG in 38, redo CABG in 2007: 5 grafts LIMA-LAD, SVG-RPDA, SVG-OM2 -> essentially occluded LAD, RCA, OM1 with 60% ISR in ostial/proximal LCx, and all grafts noted to be open as of cath in June 2017 for abnormal Myoview with apical partially reversible defect concerning for peri-infarct ischemia) & reduced EF by Echo and Myoview of 40 to 45% (on standing dose indapamide along with as needed furosemide) who was last seen by Dr. Fletcher Anon in May 2021.  Was doing well at that time.  No medical changes made.  Remains on indapamide for mild HFrEF.  BP controlled. [facility] 12/12/2019 with complaints of chest pressure off and on for the past 4 days initially with exertion, now at rest over the last 24 hours.  Discomfort is described as being over the anterior wall 4 out of 10 when initially evaluated by hospitalist service.  Ruled out for PE with CT angiogram.  Was ruled out for MI with troponin negative x4.  Per my interview: Patient had not been feeling well for most of last week.  Less energy, more edema and exertional dyspnea.  On Thursday while mowing the lawn and doing weed eating, he started having left-sided chest discomfort described as a swelling pressure in his chest.  He became very sweaty took a long time to recover  from the sweat.  Symptoms relieved he went inside to cool down to get something to drink.  He felt okay on Friday but had occasional episodes where he felt some discomfort in his chest lasting a few minutes --Not enough to say anything about.  Other than the discomfort noted with doing yard work, he could not tell if the symptoms were exacerbated by activity.  Symptoms came to ahead yesterday 12/11/2019 when he was having a couple coffee with his brother and grandnephew.  He started again having this full tightness sensation in his chest that would get severely worse developed almost 10 out of 10 and then wean back down to 4 out of 10.  His brother noted that he did not feel well and recommend that he goes home to rest.  He called his wife at work let her know that he was going home.  She then recommended that they take him to their primary care office.  Upon arrival there he was evaluated with a EKG that was ability normal and was told to come to the ER via EMS ->  he did note that symptoms got worse with walking into the clinic.Willie Clark  Per his report, his symptoms started feeling better when they give him a little bit of nitroglycerin and morphine.  He is on transport he indicated that the paramedics noted some intermittent bursts of fast heartbeats on the monitor but was not able to capture them.  Upon arrival here to the emergency room  [facility], is discomfort was easing off to more the baseline pressure that he was having.  No more the significant heavy pressure/tightness.  Symptoms fully resolved after receiving nitroglycerin and morphine.  Despite arriving via EMS, he is that close to 12 hours in the waiting room intermittently get blood drawn for checking troponins.  He finally came back to her room in the ER at roughly 2 AM.  Hospitalist service was then called to admit the patient for chest pain evaluation.  He describes the chest discomfort as a swelling pressure-like sensation from within his chest pushing out  but cannot push out because of pressure coming from outside.  He did notice a little bit of sweating and shortness of breath associated with it.  The severe episodes come and go, but the underlying pressure sensation has remained constant until he was treated here with morphine nitroglycerin.  Since the symptoms got worse yesterday morning, he minimize his activity levels.  As I am seeing him, he notes that the chest discomfort is pretty much resolved but he just does not "feel right.  He does note that taking a deep breath or coughing will also hurt his chest.  But this is been ongoing for years since his last bypass surgery.  Cardiovascular ROS: positive for - chest pain, dyspnea on exertion, edema (he has been taking as needed Lasix for the last 3 days due to ongoing swelling and orthopnea and Multiple episodes of chest tightness negative for - loss of consciousness, orthopnea, palpitations or rapid heart rate  Past Medical History:  Diagnosis Date  . Anxiety   . Arthritis   . Barrett's esophagus   . Bowel obstruction ([place]) 02/2009   small bowel  . Chronic chest pain   . Coronary artery disease    a. 1993 CABG; b. 2007 Redo CABG; c. 09/2015 Cath: LM mild dzs, LAD 100ost, RI mild dzs, LCX 60p, OM1 100, RCA 100ost, VG->OM2 min irregs, LIMA->LAD nl, VG->RPDA 30ost, 20d, nl EF->Med rx.  . Depression   . Diverticulitis   . Diverticulosis   . Erectile dysfunction   . Fibromyalgia   . Gastric ulcer   . Gastritis   . GERD (gastroesophageal reflux disease)   . Headache   . Hemorrhoids   . HFrEF (heart failure with reduced ejection fraction) ([place])    a. 09/2016 Echo: EF 55-60%, no rwma, triv AI, mild MR, mildly dil LA. -> as of April 2020: EF 40 to 45%.  Septal dyssynergy/hypokinesis due to postop state but otherwise unable to assess wall motion Willie Clark due to poor study.  . History of myocardial infarct at age less than 60 years   . Hyperlipidemia   . Hypertension   . Kidney stones    going  to [facility]  June 26 to see kidney specialist  . Lightheadedness   . Mass on back   . Nephrolithiasis   . Orthostatic hypotension    a. Improved after discontinuation of metoprolol.  . OSA (obstructive sleep apnea)    a. On CPAP.  Willie Clark Pulmonary embolism ([place]) 01/2006   Post-op/treated  . Skin lesions, generalized   . Stroke [facility]) 1993   right side weakness no blood thinners   on Aspirin  . Syncope and collapse   . Urticaria     Past Surgical History:  Procedure Laterality Date  . BACK SURGERY    . bowel obstruction  01/2009  . CARDIAC CATHETERIZATION  11/2006   patent grafts. Significant OM3 disease and occluded  diagonals.  Willie Clark CARDIAC CATHETERIZATION  06/2010   patent grafts. significant ISR in proximal LCX but OM2 is bypassed and gives retrograde flow to OM3.   Willie Clark CARDIAC CATHETERIZATION  06/2013   [facility]: Patent grafts with 60% proximal in-stent restenosis in the left circumflex with FFR of 0.85  . CARDIAC CATHETERIZATION Left 09/25/2015   Procedure: Left Heart Cath and Cors/Grafts Angiography;  Surgeon: Wellington Hampshire, MD;  Location: [facility];  Service: Cardiovascular;  Laterality: Left;  . CERVICAL FUSION    . CHOLECYSTECTOMY    . COLON SURGERY  01/2009   BOWEL RESECTION DUE TO SMALL BOWEL OBSTRUCTION  . COLONOSCOPY    . COLONOSCOPY WITH PROPOFOL N/A 10/28/2017   Procedure: COLONOSCOPY WITH PROPOFOL;  Surgeon: Lollie Sails, MD;  Location: [facility] ENDOSCOPY;  Service: Endoscopy;  Laterality: N/A;  . CORONARY ANGIOPLASTY  2006  . CORONARY ARTERY BYPASS GRAFT  1993/01/2006   redo at [facility]. LIMA to LAD, SVG to OM2 and SVG to RPDA  . CYSTOSCOPY WITH STENT PLACEMENT Bilateral 09/24/2015   Procedure: CYSTOSCOPY WITH BILATERAL RETROGRADES, BILATERAL STENT PLACEMENT;  Surgeon: Festus Aloe, MD;  Location: [facility] ORS;  Service: Urology;  Laterality: Bilateral;  . ESOPHAGOGASTRODUODENOSCOPY N/A 10/25/2014   Procedure: ESOPHAGOGASTRODUODENOSCOPY (EGD);  Surgeon: Lollie Sails, MD;  Location: [facility] ENDOSCOPY;  Service: Endoscopy;  Laterality: N/A;  . ESOPHAGOGASTRODUODENOSCOPY (EGD) WITH PROPOFOL N/A 02/03/2017   Procedure: ESOPHAGOGASTRODUODENOSCOPY (EGD) WITH PROPOFOL;  Surgeon: Lollie Sails, MD;  Location: [facility] ENDOSCOPY;  Service: Endoscopy;  Laterality: N/A;  . ESOPHAGOGASTRODUODENOSCOPY (EGD) WITH PROPOFOL N/A 04/18/2017   Procedure: ESOPHAGOGASTRODUODENOSCOPY (EGD) WITH PROPOFOL;  Surgeon: Lollie Sails, MD;  Location: [facility] ENDOSCOPY;  Service: Endoscopy;  Laterality: N/A;  . EYE SURGERY Bilateral    Cataract Extraction with IOL  . HERNIA REPAIR    . INGUINAL HERNIA REPAIR Bilateral 01/23/2016   Procedure: HERNIA REPAIR INGUINAL ADULT BILATERAL;  Surgeon: Leonie Green, MD;  Location: [facility] ORS;  Service: General;  Laterality: Bilateral;  . LUMBAR LAMINECTOMY/DECOMPRESSION MICRODISCECTOMY Left 10/01/2016   Procedure: Left Lumbar four-five Laminotomy for resection of synovial cyst;  Surgeon: Erline Levine, MD;  Location: [place] [place];  Service: Neurosurgery;  Laterality: Left;  left  . NECK SURGERY  06/2009  . SHOULDER SURGERY  2010     Home Medications:  Prior to Admission medications   Medication Sig Start Date End Date Taking? Authorizing Provider  acetaminophen (TYLENOL) 500 MG tablet Take 500 mg by mouth every 8 (eight) hours as needed for mild pain.  06/09/12  Yes [provider]  albuterol (PROVENTIL HFA;VENTOLIN HFA) 108 (90 Base) MCG/ACT inhaler Inhale into the lungs every 4 (four) hours as needed for wheezing or shortness of breath.   Yes [provider]  aspirin EC 81 MG tablet Take 81 mg by mouth daily.   Yes [provider]  citalopram (CELEXA) 20 MG tablet Take 20 mg by mouth daily.    Yes [provider]  dicyclomine (BENTYL) 10 MG capsule Take 10 mg by mouth 3 (three) times daily. 11/16/19  Yes [provider]  docusate sodium (COLACE) 100 MG capsule Take 100 mg by mouth 2 (two) times  daily.   Yes [provider]  Erenumab-aooe 140 MG/ML SOAJ Inject 140 mg into the skin every 28 (twenty-eight) days.   Yes [provider]  fexofenadine (ALLEGRA) 180 MG tablet Take 180 mg by mouth daily.   Yes [provider]  FIBER PO Take 1 tablet by  mouth daily.    Yes [provider]  finasteride (PROSCAR) 5 MG tablet Take 5 mg by mouth daily.   Yes [provider]  furosemide (LASIX) 40 MG tablet Take 40 mg by mouth daily as needed for fluid.   Yes [provider]  gabapentin (NEURONTIN) 600 MG tablet Take 600 mg by mouth 3 (three) times daily. 10/07/19  Yes [provider]  indapamide (LOZOL) 2.5 MG tablet Take 2.5 mg by mouth daily. 10/17/17  Yes [provider]  Methylcellulose, Laxative, 500 MG TABS Take 1 tablet by mouth daily.   Yes [provider]  Multiple Vitamins-Minerals (HAIR/SKIN/NAILS PO) Take 1 tablet by mouth 3 (three) times daily.    Yes [provider]  nitroGLYCERIN (NITROSTAT) 0.4 MG SL tablet Place 0.4 mg under the tongue every 5 (five) minutes as needed for chest pain.    Yes [provider]  pantoprazole (PROTONIX) 40 MG tablet Take 40 mg by mouth 2 (two) times daily.    Yes [provider]  potassium chloride SA (K-DUR,KLOR-CON) 20 MEQ tablet Take 20 mEq by mouth daily.   Yes [provider]  rOPINIRole (REQUIP) 1 MG tablet Take 1 mg by mouth at bedtime.  11/19/19  Yes [provider]  rosuvastatin (CRESTOR) 20 MG tablet Take 20 mg by mouth daily.   Yes [provider]  triamcinolone ointment (KENALOG) 0.1 % Apply 1 application topically 2 (two) times daily.   Yes [provider]  vitamin B-12 (CYANOCOBALAMIN) 1000 MCG tablet Take 1,000 mcg by mouth daily.   Yes [provider]  psyllium (REGULOID) 0.52 g capsule Take 0.52 g by mouth daily. Patient not taking: Reported on 12/12/2019    [provider]     Inpatient Medications: Scheduled Meds: . aspirin EC  81 mg Oral Daily  . citalopram  20 mg Oral Daily  . dicyclomine  10 mg Oral TID  . docusate sodium  100 mg Oral BID  . enoxaparin (LOVENOX) injection  40 mg Subcutaneous Q24H  . finasteride  5 mg Oral Daily  . gabapentin  600 mg Oral TID  . loratadine  10 mg Oral Daily  . pantoprazole  40 mg Oral BID  . polycarbophil  625 mg Oral Daily  . potassium chloride  40 mEq Oral BID  . psyllium  1 packet Oral Daily  . rOPINIRole  1 mg Oral QHS  . rosuvastatin  20 mg Oral Daily  . triamcinolone ointment  1 application Topical BID  . vitamin B-12  1,000 mcg Oral Daily   Continuous Infusions:  PRN Meds: acetaminophen, albuterol, nitroGLYCERIN, ondansetron (ZOFRAN) IV  Allergies:    Allergies  Allergen Reactions  . Amitriptyline Other (See Comments)    Pt unsure of what happens  . Cyclobenzaprine Other (See Comments) and Hives    Pt unsure what hapens  . Berniece Salines Flavor Diarrhea  . Fish Oil Itching  . Lidocaine Nausea And Vomiting, Other (See Comments) and Nausea Only    Nasal Spray (only time had reaction)  . Omega-3 Fatty Acids-Vitamin E Itching  . Other Diarrhea and Itching    Reactive agents: Onions, bacon, steak  . Percocet [Oxycodone-Acetaminophen] Other (See Comments)    Lowers BP   Social History:   Social History   Tobacco Use  . Smoking status: Former Smoker    Packs/day: 2.50    Types: Cigarettes    Quit date: 08/21/1991    Years since quitting: 28.3  . Smokeless  tobacco: Never Used  Vaping Use  . Vaping Use: Never used  Substance Use Topics  . Alcohol use: Not Currently  . Drug use: No   Social History   Social History Narrative  . Not on file     Family History:    Family History  Problem Relation Age of Onset  . Heart disease Father 76  . Heart attack Father      ROS:  Please see the history of present illness.  Review of Systems  Constitutional: Positive for malaise/fatigue. Negative for  weight loss.  HENT: Negative for congestion and nosebleeds.   Respiratory: Positive for shortness of breath. Negative for cough and wheezing.   Cardiovascular: Positive for chest pain and leg swelling.  Gastrointestinal: Positive for abdominal pain and nausea. Negative for blood in stool, constipation, diarrhea, melena and vomiting.  Genitourinary: Negative for hematuria.  Musculoskeletal: Negative for falls and joint pain.  Neurological: Positive for dizziness and weakness.  Psychiatric/Behavioral: Negative.   All other systems reviewed and are negative.   Physical Exam/Data:   Vitals:   12/12/19 0700 12/12/19 0715 12/12/19 0900 12/12/19 1000  BP: (!) 131/94  128/69 (!) 143/85  Pulse: 60 86 (!) 57 (!) 56  Resp: 15 18 12 17   Temp:      TempSrc:      SpO2: 96% 93% 96% 98%  Weight:      Height:       No intake or output data in the 24 hours ending 12/12/19 1600 Last 3 Weights 12/11/2019 09/14/2019 09/04/2018  Weight (lbs) 185 lb 191 lb 187 lb 4 oz  Weight (kg) 83.915 kg 86.637 kg 84.936 kg     Body mass index is 27.32 kg/m.  Physical Exam Vitals and nursing note reviewed.  Constitutional:      General: He is not in acute distress.    Appearance: Normal appearance. He is normal weight. He is not diaphoretic.     Comments: Mildly ill-appearing but nontoxic.  HENT:     Head: Normocephalic and atraumatic.     Nose: Nose normal.     Mouth/Throat:     Mouth: Mucous membranes are dry.  Eyes:     Extraocular Movements: Extraocular movements intact.     Conjunctiva/sclera: Conjunctivae normal.     Pupils: Pupils are equal, round, and reactive to light.  Neck:     Vascular: No carotid bruit.     Comments: No JVD with trivial HJR Cardiovascular:     Rate and Rhythm: Normal rate.     Pulses: Normal pulses.     Heart sounds: Normal heart sounds. No murmur heard.  No gallop.      Comments: Nondisplaced PMI.  No ectopy Pulmonary:     Effort: Pulmonary effort is normal. No  respiratory distress.     Breath sounds: Normal breath sounds.  Chest:     Chest wall: No tenderness.  Abdominal:     General: Abdomen is flat. Bowel sounds are normal.     Palpations: Abdomen is soft.     Comments: Mildly obese but otherwise soft, NT/ND/NABS.  No HSM.  Musculoskeletal:        General: Swelling (Trivial 1+ bilateral LE) present. No tenderness. Normal range of motion.     Cervical back: Normal range of motion.  Skin:    General: Skin is warm and dry.  Neurological:     General: No focal deficit present.     Mental Status: He is alert and oriented  to person, place, and time.     Cranial Nerves: No cranial nerve deficit.  Psychiatric:        Mood and Affect: Mood normal.        Behavior: Behavior normal.        Thought Content: Thought content normal.        Judgment: Judgment normal.     EKG:  The EKG was personally reviewed and demonstrates: Sinus rhythm, rate 59 bpm, PAC noted that had relatively stable EKG (QRS complexes and inferior leads are somewhat slurred, stable. Telemetry:  Telemetry was personally reviewed and demonstrates: Sinus rhythm, rates in the 60s.  Occasional PACs.  Relevant CV Studies:  Cardiac Cath (June 2017): LM mild dzs, LAD 100ost, RI mild dzs, LCX 60p, OM1 100, RCA 100ost, VG->OM2 min irregs, LIMA->LAD nl, VG->RPDA 30ost, 20d, nl EF->Med rx.  Myoview (August 2019): Similar findings in May 2017 and 2018 (Noted above); EF estimated 40%.  Mostly fixed apical/distal anterior wall defect concerning for prior infarct with peri-infarct ischemia.  (Moderate Risk)-medical management because of stable disease by previous cath.  Echo (August 01, 2019): EF 40 and 45%.  Septal HK related to postop state.  Unable assess other wall motion normalities quality study.  Small pleural effusion.  Otherwise normal valves, and RV.   New echo ordered this hospitalization, not read yet.  Laboratory Data:  High Sensitivity Troponin:   Recent Labs  Lab  12/11/19 1237 12/11/19 1938 12/12/19 0448 12/12/19 0651  TROPONINIHS 6 8 6 5      Chemistry Recent Labs  Lab 12/11/19 1237 12/12/19 0448  NA 138 139  K 2.7* 2.4*  CL 95* 104  CO2 30 25  GLUCOSE 113* 86  BUN 14 11  CREATININE 0.87 0.57*  CALCIUM 9.0 7.1*  GFRNONAA >60 >60  GFRAA >60 >60  ANIONGAP 13 10    Recent Labs  Lab 12/11/19 1237  PROT 7.1  ALBUMIN 4.6  AST 45*  ALT 36  ALKPHOS 51  BILITOT 1.0   Hematology Recent Labs  Lab 12/11/19 1237  WBC 6.9  RBC 4.31  HGB 14.4  HCT 42.7  MCV 99.1  MCH 33.4  MCHC 33.7  RDW 13.1  PLT 167   BNPNo results for input(s): BNP, PROBNP in the last 168 hours.  DDimer No results for input(s): DDIMER in the last 168 hours.   Radiology/Studies:  DG Chest 2 View  Result Date: 12/11/2019 CLINICAL DATA:  Chest pain. EXAM: CHEST - 2 VIEW COMPARISON:  July 31, 2018 FINDINGS: A nodular density projects over the left base. Another nodular density projects over the left upper lobe. Neither of these nodular densities were seen on a CT of the chest July 31, 2018 the heart, hila, and mediastinum are normal. No masses or focal infiltrates. IMPRESSION: 1. A nodular density projects over the left apex. Recommend a CT scan of the chest for better evaluation. 2. The nodular density over the left base could represent a pulmonary nodule or nipple shadow. Recommend attention to this region on the recommended CT scan. Electronically Signed   By: Dorise Bullion III M.D   On: 12/11/2019 13:53   CT Angio Chest PE W/Cm &/Or Wo Cm  Result Date: 12/11/2019 CLINICAL DATA:  Chest pain, nausea, chest pressure EXAM: CT ANGIOGRAPHY CHEST WITH CONTRAST TECHNIQUE: Multidetector CT imaging of the chest was performed using the standard protocol during bolus administration of intravenous contrast. Multiplanar CT image reconstructions and MIPs were obtained to evaluate the vascular anatomy. CONTRAST:  37mL OMNIPAQUE IOHEXOL 350 MG/ML SOLN COMPARISON:  07/31/2018  FINDINGS: Cardiovascular: There is adequate opacification of the pulmonary arterial tree. There is no intraluminal filling defect identified to suggest acute pulmonary embolism. The central pulmonary arteries are of normal caliber. Coronary artery bypass grafting has been performed. There is extensive coronary artery calcification as well as evidence of prior stenting involving the native coronary arteries. Cardiac size is at the upper limits of normal. Mild extension of hyperdense contrast across the interatrial septum suggests a atrial septal defect involving the foramen ovale. No pericardial effusion. The thoracic aorta demonstrates mild scattered atherosclerotic calcification and is of normal caliber. Mediastinum/Nodes: No pathologic thoracic adenopathy. Thyroid unremarkable. Esophagus unremarkable. Lungs/Pleura: Lungs are clear. No pneumothorax or pleural effusion. Central airways are widely patent. Upper Abdomen: No acute abnormality. Musculoskeletal: No chest wall abnormality. No acute or significant osseous findings. Review of the MIP images confirms the above findings. IMPRESSION: 1. No evidence of acute pulmonary embolism. Lungs are clear. 2. 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 3. Aortic atherosclerosis. Aortic Atherosclerosis (ICD10-I70.0). Electronically Signed   By: Fidela Salisbury MD   On: 12/11/2019 20:05       TIMI Risk Score for Unstable Angina or Non-ST Elevation MI:   The patient's TIMI risk score is 5, which indicates a 26% risk of all cause mortality, new or recurrent myocardial infarction or need for urgent revascularization in the next 14 days.   New York Heart Association (NYHA) Functional Class NYHA Class II -> mild ankle edema and exertional dyspnea  Assessment and Plan:   Principal Problem:   Unstable angina (HCC) Active Problems:   Coronary artery disease involving native coronary artery of native heart with angina pectoris  (HCC)   Chronic combined systolic and diastolic congestive heart failure (HCC)   Hypokalemia   Depression  "Dylon Correa presents here today with symptoms that he says are worse and more persistent and progressively worsening discomfort that he has had in the past few years.  He has had similar symptoms but this time around is more consistent with what he noted back in the 2006-2007 timeframe prior to his redo CABG.  There are some typical but otherwise atypical features of his chest pain.  Atypical features including the duration of symptoms with negative troponin levels, and worse with deep inspiration.  Typical features are the fact that if first occurred while doing yard work before ever happening at rest.  Associated with shortness of breath and nausea.   At this point given progression of symptoms and severity leading to his ER visit, I would concur the possible diagnosis of unstable angina.  RECOMMENDATIONS  Known CAD-s/p CABG presenting with unstable angina: He is not currently on IV heparin, however if he were to have recurrent chest pain (requiring sublingual nitroglycerin) would start IV heparin and nitroglycerin.  We discussed options for ischemic evaluation.  At this point he has had 3 successive Myoview's in the last 5 years for less significant symptoms but now presents with worsening progressive anginal type symptoms.  He feels as though a stress test is not likely to help as a positive test would only electively lead to heart catheterization, and the only way he would be sure is if we did a definitive evaluation.  I tend to agree that his symptoms are concerning for unstable angina and therefore with his known history would recommend LEFT HEART CATHETERIZATION WITH NATIVE CORONARY AND AND CORONARY GRAFT ANGIOGRAPHY.  Will make  n.p.o. after midnight and have posted on the board for Dr. Fletcher Anon tomorrow. ->  Morning orders will be written by Marrianne Mood, PA  At home, he is not on  any standard cardiac medicines besides aspirin and rosuvastatin which we will continue.  No indication as to why he is not on beta-blocker in the outpatient setting, however his resting heart rate is 60 the low 50s with one brief burst.  His blood pressure is elevated, will start low-dose ARB in light of his recently reduced EF on echo.  Chronic combined systolic and diastolic heart failure with mildly reduced EF: Minimal edema at present, but he has been taking more furosemide.  I suspect this is what led to his hypokalemia, but no real PND or orthopnea per se. ->  In light of his hypokalemia I would be reluctant to further push his diuretic.  Can reevaluate EDP in the Cath Lab..   Performing MD:  Dr. Midge Minium  Procedure: [place] with POSSIBLE PERCUTANEOUS CORONARY INTERVENTION  The procedure with Risks/Benefits/Alternatives and Indications was reviewed with the patient & wife.  All questions were answered.    Risks / Complications include, but not limited to: Death, MI, CVA/TIA, VF/VT (with defibrillation), Bradycardia (need for temporary pacer placement), contrast induced nephropathy, bleeding / bruising / hematoma / pseudoaneurysm, vascular or coronary injury (with possible emergent CT or Vascular Surgery), adverse medication reactions, infection.  Additional risks involving the use of radiation with the possibility of radiation burns and cancer were explained in detail.  The patient and family voice understanding and agree to proceed.       Signed, Glenetta Hew, MD  12/12/2019 4:00 PM  For questions or updates, please contact [place] Please consult [email or web address] for contact info under

## 2019-12-12 NOTE — H&P (View-Only) (Signed)
Cardiology Consultation:   Patient ID: Willie Clark MRN: [id number]; DOB: [birth year]  Admit date: 12/12/2019 Date of Consult: 12/12/2019  Primary Care Provider: [name], MD [facility] Cardiologist: [name], MD  [facility] Electrophysiologist:  None   Referring Physician: [name], MD  Patient Profile:   Willie Clark is a 69 y.o. male with a hx of significant prior CAD/mild HFrEF and prior history of PE as documented below who is being seen today for the evaluation of CHEST PAIN/UNSTABLE ANGINA at the request of [name], [name], MD.  History of Present Illness:   [name] with a history of longstanding CAD dating back to 76 (initial CABG in 36, redo CABG in 2007: 5 grafts LIMA-LAD, SVG-RPDA, SVG-OM2 -> essentially occluded LAD, RCA, OM1 with 60% ISR in ostial/proximal LCx, and all grafts noted to be open as of cath in June 2017 for abnormal Myoview with apical partially reversible defect concerning for peri-infarct ischemia) & reduced EF by Echo and Myoview of 40 to 45% (on standing dose indapamide along with as needed furosemide) who was last seen by Dr. Fletcher Anon in May 2021.  Was doing well at that time.  No medical changes made.  Remains on indapamide for mild HFrEF.  BP controlled. [place] ER 12/12/2019 with complaints of chest pressure off and on for the past 4 days initially with exertion, now at rest over the last 24 hours.  Discomfort is described as being over the anterior wall 4 out of 10 when initially evaluated by hospitalist service.  Ruled out for PE with CT angiogram.  Was ruled out for MI with troponin negative x4.  Per my interview: Patient had not been feeling well for most of last week.  Less energy, more edema and exertional dyspnea.  On Thursday while mowing the lawn and doing weed eating, he started having left-sided chest discomfort described as a swelling pressure in his chest.  He became very sweaty took a long time to recover  from the sweat.  Symptoms relieved he went inside to cool down to get something to drink.  He felt okay on Friday but had occasional episodes where he felt some discomfort in his chest lasting a few minutes --Not enough to say anything about.  Other than the discomfort noted with doing yard work, he could not tell if the symptoms were exacerbated by activity.  Symptoms came to ahead yesterday 12/11/2019 when he was having a couple coffee with his brother and grandnephew.  He started again having this full tightness sensation in his chest that would get severely worse developed almost 10 out of 10 and then wean back down to 4 out of 10.  His brother noted that he did not feel well and recommend that he goes home to rest.  He called his wife at work let her know that he was going home.  She then recommended that they take him to their primary care office.  Upon arrival there he was evaluated with a EKG that was ability normal and was told to come to the ER via EMS ->  he did note that symptoms got worse with walking into the clinic.[name]  Per his report, his symptoms started feeling better when they give him a little bit of nitroglycerin and morphine.  He is on transport he indicated that the paramedics noted some intermittent bursts of fast heartbeats on the monitor but was not able to capture them.  Upon arrival here to the emergency room  [facility], is discomfort was easing off to more the baseline pressure that he was having.  No more the significant heavy pressure/tightness.  Symptoms fully resolved after receiving nitroglycerin and morphine.  Despite arriving via EMS, he is that close to 12 hours in the waiting room intermittently get blood drawn for checking troponins.  He finally came back to her room in the ER at roughly 2 AM.  Hospitalist service was then called to admit the patient for chest pain evaluation.  He describes the chest discomfort as a swelling pressure-like sensation from within his chest pushing out  but cannot push out because of pressure coming from outside.  He did notice a little bit of sweating and shortness of breath associated with it.  The severe episodes come and go, but the underlying pressure sensation has remained constant until he was treated here with morphine nitroglycerin.  Since the symptoms got worse yesterday morning, he minimize his activity levels.  As I am seeing him, he notes that the chest discomfort is pretty much resolved but he just does not "feel right.  He does note that taking a deep breath or coughing will also hurt his chest.  But this is been ongoing for years since his last bypass surgery.  Cardiovascular ROS: positive for - chest pain, dyspnea on exertion, edema (he has been taking as needed Lasix for the last 3 days due to ongoing swelling and orthopnea and Multiple episodes of chest tightness negative for - loss of consciousness, orthopnea, palpitations or rapid heart rate  Past Medical History:  Diagnosis Date  . Anxiety   . Arthritis   . Barrett's esophagus   . Bowel obstruction ([place]) 02/2009   small bowel  . Chronic chest pain   . Coronary artery disease    a. 1993 CABG; b. 2007 Redo CABG; c. 09/2015 Cath: LM mild dzs, LAD 100ost, RI mild dzs, LCX 60p, OM1 100, RCA 100ost, VG->OM2 min irregs, LIMA->LAD nl, VG->RPDA 30ost, 20d, nl EF->Med rx.  . Depression   . Diverticulitis   . Diverticulosis   . Erectile dysfunction   . Fibromyalgia   . Gastric ulcer   . Gastritis   . GERD (gastroesophageal reflux disease)   . Headache   . Hemorrhoids   . HFrEF (heart failure with reduced ejection fraction) ([place])    a. 09/2016 Echo: EF 55-60%, no rwma, triv AI, mild MR, mildly dil LA. -> as of April 2020: EF 40 to 45%.  Septal dyssynergy/hypokinesis due to postop state but otherwise unable to assess wall motion [name] due to poor study.  . History of myocardial infarct at age less than 75 years   . Hyperlipidemia   . Hypertension   . Kidney stones    going  to [facility]  June 26 to see kidney specialist  . Lightheadedness   . Mass on back   . Nephrolithiasis   . Orthostatic hypotension    a. Improved after discontinuation of metoprolol.  . OSA (obstructive sleep apnea)    a. On CPAP.  [name] Pulmonary embolism ([place]) 01/2006   Post-op/treated  . Skin lesions, generalized   . Stroke [facility]) 1993   right side weakness no blood thinners   on Aspirin  . Syncope and collapse   . Urticaria     Past Surgical History:  Procedure Laterality Date  . BACK SURGERY    . bowel obstruction  01/2009  . CARDIAC CATHETERIZATION  11/2006   patent grafts. Significant OM3 disease and occluded  diagonals.  [name] CARDIAC CATHETERIZATION  06/2010   patent grafts. significant ISR in proximal LCX but OM2 is bypassed and gives retrograde flow to OM3.   [name] CARDIAC CATHETERIZATION  06/2013   [facility]: Patent grafts with 60% proximal in-stent restenosis in the left circumflex with FFR of 0.85  . CARDIAC CATHETERIZATION Left 09/25/2015   Procedure: Left Heart Cath and Cors/Grafts Angiography;  Surgeon: Wellington Hampshire, MD;  Location: [place] CV LAB;  Service: Cardiovascular;  Laterality: Left;  . CERVICAL FUSION    . CHOLECYSTECTOMY    . COLON SURGERY  01/2009   BOWEL RESECTION DUE TO SMALL BOWEL OBSTRUCTION  . COLONOSCOPY    . COLONOSCOPY WITH PROPOFOL N/A 10/28/2017   Procedure: COLONOSCOPY WITH PROPOFOL;  Surgeon: Lollie Sails, MD;  Location: [facility] ENDOSCOPY;  Service: Endoscopy;  Laterality: N/A;  . CORONARY ANGIOPLASTY  2006  . CORONARY ARTERY BYPASS GRAFT  1993/01/2006   redo at [facility]. LIMA to LAD, SVG to OM2 and SVG to RPDA  . CYSTOSCOPY WITH STENT PLACEMENT Bilateral 09/24/2015   Procedure: CYSTOSCOPY WITH BILATERAL RETROGRADES, BILATERAL STENT PLACEMENT;  Surgeon: Festus Aloe, MD;  Location: [facility] ORS;  Service: Urology;  Laterality: Bilateral;  . ESOPHAGOGASTRODUODENOSCOPY N/A 10/25/2014   Procedure: ESOPHAGOGASTRODUODENOSCOPY (EGD);  Surgeon: Lollie Sails, MD;  Location: [facility] ENDOSCOPY;  Service: Endoscopy;  Laterality: N/A;  . ESOPHAGOGASTRODUODENOSCOPY (EGD) WITH PROPOFOL N/A 02/03/2017   Procedure: ESOPHAGOGASTRODUODENOSCOPY (EGD) WITH PROPOFOL;  Surgeon: Lollie Sails, MD;  Location: [facility] ENDOSCOPY;  Service: Endoscopy;  Laterality: N/A;  . ESOPHAGOGASTRODUODENOSCOPY (EGD) WITH PROPOFOL N/A 04/18/2017   Procedure: ESOPHAGOGASTRODUODENOSCOPY (EGD) WITH PROPOFOL;  Surgeon: Lollie Sails, MD;  Location: [facility] ENDOSCOPY;  Service: Endoscopy;  Laterality: N/A;  . EYE SURGERY Bilateral    Cataract Extraction with IOL  . HERNIA REPAIR    . INGUINAL HERNIA REPAIR Bilateral 01/23/2016   Procedure: HERNIA REPAIR INGUINAL ADULT BILATERAL;  Surgeon: Leonie Green, MD;  Location: [facility] ORS;  Service: General;  Laterality: Bilateral;  . LUMBAR LAMINECTOMY/DECOMPRESSION MICRODISCECTOMY Left 10/01/2016   Procedure: Left Lumbar four-five Laminotomy for resection of synovial cyst;  Surgeon: Erline Levine, MD;  Location: [place];  Service: Neurosurgery;  Laterality: Left;  left  . NECK SURGERY  06/2009  . SHOULDER SURGERY  2010     Home Medications:  Prior to Admission medications   Medication Sig Start Date End Date Taking? Authorizing Provider  acetaminophen (TYLENOL) 500 MG tablet Take 500 mg by mouth every 8 (eight) hours as needed for mild pain.  06/09/12  Yes [provider]  albuterol (PROVENTIL HFA;VENTOLIN HFA) 108 (90 Base) MCG/ACT inhaler Inhale into the lungs every 4 (four) hours as needed for wheezing or shortness of breath.   Yes [provider]  aspirin EC 81 MG tablet Take 81 mg by mouth daily.   Yes [provider]  citalopram (CELEXA) 20 MG tablet Take 20 mg by mouth daily.    Yes [provider]  dicyclomine (BENTYL) 10 MG capsule Take 10 mg by mouth 3 (three) times daily. 11/16/19  Yes [provider]  docusate sodium (COLACE) 100 MG capsule Take 100 mg by mouth 2 (two) times  daily.   Yes [provider]  Erenumab-aooe 140 MG/ML SOAJ Inject 140 mg into the skin every 28 (twenty-eight) days.   Yes [provider]  fexofenadine (ALLEGRA) 180 MG tablet Take 180 mg by mouth daily.   Yes [provider]  FIBER PO Take 1 tablet by  mouth daily.    Yes [provider]  finasteride (PROSCAR) 5 MG tablet Take 5 mg by mouth daily.   Yes [provider]  furosemide (LASIX) 40 MG tablet Take 40 mg by mouth daily as needed for fluid.   Yes [provider]  gabapentin (NEURONTIN) 600 MG tablet Take 600 mg by mouth 3 (three) times daily. 10/07/19  Yes [provider]  indapamide (LOZOL) 2.5 MG tablet Take 2.5 mg by mouth daily. 10/17/17  Yes [provider]  Methylcellulose, Laxative, 500 MG TABS Take 1 tablet by mouth daily.   Yes [provider]  Multiple Vitamins-Minerals (HAIR/SKIN/NAILS PO) Take 1 tablet by mouth 3 (three) times daily.    Yes [provider]  nitroGLYCERIN (NITROSTAT) 0.4 MG SL tablet Place 0.4 mg under the tongue every 5 (five) minutes as needed for chest pain.    Yes [provider]  pantoprazole (PROTONIX) 40 MG tablet Take 40 mg by mouth 2 (two) times daily.    Yes [provider]  potassium chloride SA (K-DUR,KLOR-CON) 20 MEQ tablet Take 20 mEq by mouth daily.   Yes [provider]  rOPINIRole (REQUIP) 1 MG tablet Take 1 mg by mouth at bedtime.  11/19/19  Yes [provider]  rosuvastatin (CRESTOR) 20 MG tablet Take 20 mg by mouth daily.   Yes [provider]  triamcinolone ointment (KENALOG) 0.1 % Apply 1 application topically 2 (two) times daily.   Yes [provider]  vitamin B-12 (CYANOCOBALAMIN) 1000 MCG tablet Take 1,000 mcg by mouth daily.   Yes [provider]  psyllium (REGULOID) 0.52 g capsule Take 0.52 g by mouth daily. Patient not taking: Reported on 12/12/2019    [provider]     Inpatient Medications: Scheduled Meds: . aspirin EC  81 mg Oral Daily  . citalopram  20 mg Oral Daily  . dicyclomine  10 mg Oral TID  . docusate sodium  100 mg Oral BID  . enoxaparin (LOVENOX) injection  40 mg Subcutaneous Q24H  . finasteride  5 mg Oral Daily  . gabapentin  600 mg Oral TID  . loratadine  10 mg Oral Daily  . pantoprazole  40 mg Oral BID  . polycarbophil  625 mg Oral Daily  . potassium chloride  40 mEq Oral BID  . psyllium  1 packet Oral Daily  . rOPINIRole  1 mg Oral QHS  . rosuvastatin  20 mg Oral Daily  . triamcinolone ointment  1 application Topical BID  . vitamin B-12  1,000 mcg Oral Daily   Continuous Infusions:  PRN Meds: acetaminophen, albuterol, nitroGLYCERIN, ondansetron (ZOFRAN) IV  Allergies:    Allergies  Allergen Reactions  . Amitriptyline Other (See Comments)    Pt unsure of what happens  . Cyclobenzaprine Other (See Comments) and Hives    Pt unsure what hapens  . Berniece Salines Flavor Diarrhea  . Fish Oil Itching  . Lidocaine Nausea And Vomiting, Other (See Comments) and Nausea Only    Nasal Spray (only time had reaction)  . Omega-3 Fatty Acids-Vitamin E Itching  . Other Diarrhea and Itching    Reactive agents: Onions, bacon, steak  . Percocet [Oxycodone-Acetaminophen] Other (See Comments)    Lowers BP   Social History:   Social History   Tobacco Use  . Smoking status: Former Smoker    Packs/day: 2.50    Types: Cigarettes    Quit date: 08/21/1991    Years since quitting: 28.3  . Smokeless  tobacco: Never Used  Vaping Use  . Vaping Use: Never used  Substance Use Topics  . Alcohol use: Not Currently  . Drug use: No   Social History   Social History Narrative  . Not on file     Family History:    Family History  Problem Relation Age of Onset  . Heart disease Father 63  . Heart attack Father      ROS:  Please see the history of present illness.  Review of Systems  Constitutional: Positive for malaise/fatigue. Negative for  weight loss.  HENT: Negative for congestion and nosebleeds.   Respiratory: Positive for shortness of breath. Negative for cough and wheezing.   Cardiovascular: Positive for chest pain and leg swelling.  Gastrointestinal: Positive for abdominal pain and nausea. Negative for blood in stool, constipation, diarrhea, melena and vomiting.  Genitourinary: Negative for hematuria.  Musculoskeletal: Negative for falls and joint pain.  Neurological: Positive for dizziness and weakness.  Psychiatric/Behavioral: Negative.   All other systems reviewed and are negative.   Physical Exam/Data:   Vitals:   12/12/19 0700 12/12/19 0715 12/12/19 0900 12/12/19 1000  BP: (!) 131/94  128/69 (!) 143/85  Pulse: 60 86 (!) 57 (!) 56  Resp: 15 18 12 17   Temp:      TempSrc:      SpO2: 96% 93% 96% 98%  Weight:      Height:       No intake or output data in the 24 hours ending 12/12/19 1600 Last 3 Weights 12/11/2019 09/14/2019 09/04/2018  Weight (lbs) 185 lb 191 lb 187 lb 4 oz  Weight (kg) 83.915 kg 86.637 kg 84.936 kg     Body mass index is 27.32 kg/m.  Physical Exam Vitals and nursing note reviewed.  Constitutional:      General: He is not in acute distress.    Appearance: Normal appearance. He is normal weight. He is not diaphoretic.     Comments: Mildly ill-appearing but nontoxic.  HENT:     Head: Normocephalic and atraumatic.     Nose: Nose normal.     Mouth/Throat:     Mouth: Mucous membranes are dry.  Eyes:     Extraocular Movements: Extraocular movements intact.     Conjunctiva/sclera: Conjunctivae normal.     Pupils: Pupils are equal, round, and reactive to light.  Neck:     Vascular: No carotid bruit.     Comments: No JVD with trivial HJR Cardiovascular:     Rate and Rhythm: Normal rate.     Pulses: Normal pulses.     Heart sounds: Normal heart sounds. No murmur heard.  No gallop.      Comments: Nondisplaced PMI.  No ectopy Pulmonary:     Effort: Pulmonary effort is normal. No  respiratory distress.     Breath sounds: Normal breath sounds.  Chest:     Chest wall: No tenderness.  Abdominal:     General: Abdomen is flat. Bowel sounds are normal.     Palpations: Abdomen is soft.     Comments: Mildly obese but otherwise soft, NT/ND/NABS.  No HSM.  Musculoskeletal:        General: Swelling (Trivial 1+ bilateral LE) present. No tenderness. Normal range of motion.     Cervical back: Normal range of motion.  Skin:    General: Skin is warm and dry.  Neurological:     General: No focal deficit present.     Mental Status: He is alert and oriented  to person, place, and time.     Cranial Nerves: No cranial nerve deficit.  Psychiatric:        Mood and Affect: Mood normal.        Behavior: Behavior normal.        Thought Content: Thought content normal.        Judgment: Judgment normal.     EKG:  The EKG was personally reviewed and demonstrates: Sinus rhythm, rate 59 bpm, PAC noted that had relatively stable EKG (QRS complexes and inferior leads are somewhat slurred, stable. Telemetry:  Telemetry was personally reviewed and demonstrates: Sinus rhythm, rates in the 60s.  Occasional PACs.  Relevant CV Studies:  Cardiac Cath (June 2017): LM mild dzs, LAD 100ost, RI mild dzs, LCX 60p, OM1 100, RCA 100ost, VG->OM2 min irregs, LIMA->LAD nl, VG->RPDA 30ost, 20d, nl EF->Med rx.  Myoview (August 2019): Similar findings in May 2017 and 2018 (Noted above); EF estimated 40%.  Mostly fixed apical/distal anterior wall defect concerning for prior infarct with peri-infarct ischemia.  (Moderate Risk)-medical management because of stable disease by previous cath.  Echo (August 01, 2019): EF 40 and 45%.  Septal HK related to postop state.  Unable assess other wall motion normalities quality study.  Small pleural effusion.  Otherwise normal valves, and RV.   New echo ordered this hospitalization, not read yet.  Laboratory Data:  High Sensitivity Troponin:   Recent Labs  Lab  12/11/19 1237 12/11/19 1938 12/12/19 0448 12/12/19 0651  TROPONINIHS 6 8 6 5      Chemistry Recent Labs  Lab 12/11/19 1237 12/12/19 0448  NA 138 139  K 2.7* 2.4*  CL 95* 104  CO2 30 25  GLUCOSE 113* 86  BUN 14 11  CREATININE 0.87 0.57*  CALCIUM 9.0 7.1*  GFRNONAA >60 >60  GFRAA >60 >60  ANIONGAP 13 10    Recent Labs  Lab 12/11/19 1237  PROT 7.1  ALBUMIN 4.6  AST 45*  ALT 36  ALKPHOS 51  BILITOT 1.0   Hematology Recent Labs  Lab 12/11/19 1237  WBC 6.9  RBC 4.31  HGB 14.4  HCT 42.7  MCV 99.1  MCH 33.4  MCHC 33.7  RDW 13.1  PLT 167   BNPNo results for input(s): BNP, PROBNP in the last 168 hours.  DDimer No results for input(s): DDIMER in the last 168 hours.   Radiology/Studies:  DG Chest 2 View  Result Date: 12/11/2019 CLINICAL DATA:  Chest pain. EXAM: CHEST - 2 VIEW COMPARISON:  July 31, 2018 FINDINGS: A nodular density projects over the left base. Another nodular density projects over the left upper lobe. Neither of these nodular densities were seen on a CT of the chest July 31, 2018 the heart, hila, and mediastinum are normal. No masses or focal infiltrates. IMPRESSION: 1. A nodular density projects over the left apex. Recommend a CT scan of the chest for better evaluation. 2. The nodular density over the left base could represent a pulmonary nodule or nipple shadow. Recommend attention to this region on the recommended CT scan. Electronically Signed   By: Dorise Bullion III M.D   On: 12/11/2019 13:53   CT Angio Chest PE W/Cm &/Or Wo Cm  Result Date: 12/11/2019 CLINICAL DATA:  Chest pain, nausea, chest pressure EXAM: CT ANGIOGRAPHY CHEST WITH CONTRAST TECHNIQUE: Multidetector CT imaging of the chest was performed using the standard protocol during bolus administration of intravenous contrast. Multiplanar CT image reconstructions and MIPs were obtained to evaluate the vascular anatomy. CONTRAST:  108mL OMNIPAQUE IOHEXOL 350 MG/ML SOLN COMPARISON:  07/31/2018  FINDINGS: Cardiovascular: There is adequate opacification of the pulmonary arterial tree. There is no intraluminal filling defect identified to suggest acute pulmonary embolism. The central pulmonary arteries are of normal caliber. Coronary artery bypass grafting has been performed. There is extensive coronary artery calcification as well as evidence of prior stenting involving the native coronary arteries. Cardiac size is at the upper limits of normal. Mild extension of hyperdense contrast across the interatrial septum suggests a atrial septal defect involving the foramen ovale. No pericardial effusion. The thoracic aorta demonstrates mild scattered atherosclerotic calcification and is of normal caliber. Mediastinum/Nodes: No pathologic thoracic adenopathy. Thyroid unremarkable. Esophagus unremarkable. Lungs/Pleura: Lungs are clear. No pneumothorax or pleural effusion. Central airways are widely patent. Upper Abdomen: No acute abnormality. Musculoskeletal: No chest wall abnormality. No acute or significant osseous findings. Review of the MIP images confirms the above findings. IMPRESSION: 1. No evidence of acute pulmonary embolism. Lungs are clear. 2. 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 3. Aortic atherosclerosis. Aortic Atherosclerosis (ICD10-I70.0). Electronically Signed   By: Fidela Salisbury MD   On: 12/11/2019 20:05       TIMI Risk Score for Unstable Angina or Non-ST Elevation MI:   The patient's TIMI risk score is 5, which indicates a 26% risk of all cause mortality, new or recurrent myocardial infarction or need for urgent revascularization in the next 14 days.   New York Heart Association (NYHA) Functional Class NYHA Class II -> mild ankle edema and exertional dyspnea  Assessment and Plan:   Principal Problem:   Unstable angina (HCC) Active Problems:   Coronary artery disease involving native coronary artery of native heart with angina pectoris  (HCC)   Chronic combined systolic and diastolic congestive heart failure (HCC)   Hypokalemia   Depression  "Jayro Mcmath presents here today with symptoms that he says are worse and more persistent and progressively worsening discomfort that he has had in the past few years.  He has had similar symptoms but this time around is more consistent with what he noted back in the 2006-2007 timeframe prior to his redo CABG.  There are some typical but otherwise atypical features of his chest pain.  Atypical features including the duration of symptoms with negative troponin levels, and worse with deep inspiration.  Typical features are the fact that if first occurred while doing yard work before ever happening at rest.  Associated with shortness of breath and nausea.   At this point given progression of symptoms and severity leading to his ER visit, I would concur the possible diagnosis of unstable angina.  RECOMMENDATIONS  Known CAD-s/p CABG presenting with unstable angina: He is not currently on IV heparin, however if he were to have recurrent chest pain (requiring sublingual nitroglycerin) would start IV heparin and nitroglycerin.  We discussed options for ischemic evaluation.  At this point he has had 3 successive Myoview's in the last 5 years for less significant symptoms but now presents with worsening progressive anginal type symptoms.  He feels as though a stress test is not likely to help as a positive test would only electively lead to heart catheterization, and the only way he would be sure is if we did a definitive evaluation.  I tend to agree that his symptoms are concerning for unstable angina and therefore with his known history would recommend LEFT HEART CATHETERIZATION WITH NATIVE CORONARY AND AND CORONARY GRAFT ANGIOGRAPHY.  Will make  n.p.o. after midnight and have posted on the board for Dr. Fletcher Anon tomorrow. ->  Morning orders will be written by Marrianne Mood, PA  At home, he is not on  any standard cardiac medicines besides aspirin and rosuvastatin which we will continue.  No indication as to why he is not on beta-blocker in the outpatient setting, however his resting heart rate is 60 the low 50s with one brief burst.  His blood pressure is elevated, will start low-dose ARB in light of his recently reduced EF on echo.  Chronic combined systolic and diastolic heart failure with mildly reduced EF: Minimal edema at present, but he has been taking more furosemide.  I suspect this is what led to his hypokalemia, but no real PND or orthopnea per se. ->  In light of his hypokalemia I would be reluctant to further push his diuretic.  Can reevaluate EDP in the Cath Lab..   Performing MD:  Dr. Midge Minium  Procedure: [place] with POSSIBLE PERCUTANEOUS CORONARY INTERVENTION  The procedure with Risks/Benefits/Alternatives and Indications was reviewed with the patient & wife.  All questions were answered.    Risks / Complications include, but not limited to: Death, MI, CVA/TIA, VF/VT (with defibrillation), Bradycardia (need for temporary pacer placement), contrast induced nephropathy, bleeding / bruising / hematoma / pseudoaneurysm, vascular or coronary injury (with possible emergent CT or Vascular Surgery), adverse medication reactions, infection.  Additional risks involving the use of radiation with the possibility of radiation burns and cancer were explained in detail.  The patient and family voice understanding and agree to proceed.       Signed, Glenetta Hew, MD  12/12/2019 4:00 PM  For questions or updates, please contact [place] Please consult [email or web address] for contact info under

## 2019-12-12 NOTE — ED Provider Notes (Signed)
[facility] Emergency Department Provider Note  ____________________________________________   First MD Initiated Contact with Patient 12/12/19 [phone]     (approximate)  I have reviewed the triage vital signs and the nursing notes.   HISTORY  Chief Complaint Chest Pain    HPI Willie Clark is a 69 y.o. male with below list of previous medical conditions including myocardial infarction CABG x2, cardiac stenting, pulmonary emboli hypertension hyperlipidemia presents to the emergency department secondary to a 5-day history of central chest pain which patient describes as tightness and pressure.  Patient states that the pain last for as much as 5 to 6 hours each time.  Patient states that the worst episode that he has experienced was after mowing the lawn which she stated lasted for many hours.  Patient does admit to ongoing pain at present which he describes as tightness with current pain score 6 out of 10.  Patient also admits to dyspnea states that pain is worse with deep inspiration.  Patient denies any dizziness no nausea or vomiting.     Past Medical History:  Diagnosis Date  . (HFpEF) heart failure with preserved ejection fraction ([place])    a. 09/2016 Echo: EF 55-60%, no rwma, triv AI, mild MR, mildly dil LA.  [name] Anxiety   . Arthritis   . Barrett's esophagus   . Bowel obstruction ([place]) 02/2009   small bowel  . Chronic chest pain   . Coronary artery disease    a. 1993 CABG; b. 2007 Redo CABG; c. 09/2015 Cath: LM mild dzs, LAD 100ost, RI mild dzs, LCX 60p, OM1 100, RCA 100ost, VG->OM2 min irregs, LIMA->LAD nl, VG->RPDA 30ost, 20d, nl EF->Med rx.  . Depression   . Diverticulitis   . Diverticulosis   . Erectile dysfunction   . Fibromyalgia   . Gastric ulcer   . Gastritis   . GERD (gastroesophageal reflux disease)   . Headache   . Hemorrhoids   . Hyperlipidemia   . Hypertension   . Kidney stones    going to [facility]  June 26 to see kidney  specialist  . Lightheadedness   . Mass on back   . MI (myocardial infarction) ([place])   . Nephrolithiasis   . Orthostatic hypotension    a. Improved after discontinuation of metoprolol.  . OSA (obstructive sleep apnea)    a. On CPAP.  [name] Pulmonary embolism ([place]) 01/2006   Post-op/treated  . Skin lesions, generalized   . Stroke [facility]) 1993   right side weakness no blood thinners   on Aspirin  . Syncope and collapse   . Urticaria     Patient Active Problem List   Diagnosis Date Noted  . 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  . Chest pain 07/31/2018  . Synovial cyst of lumbar facet joint 10/01/2016  . Kidney stones   . Ureteral obstruction   . Obstructive uropathy 09/24/2015  . Abnormal stress test   . 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 Chronic combined systolic and diastolic congestive heart failure ([place])   . Essential hypertension   . Preoperative cardiovascular examination   . Bilateral leg edema 01/10/2015  . TIA (transient ischemic attack) 08/13/2014  . Intermittent claudication ([place]) 06/07/2013  . Abdominal pain 05/16/2012  . Dyspnea 11/21/2011  . Sinus bradycardia 07/12/2010  . ED (erectile dysfunction) 07/12/2010  . Coronary artery disease   . Hypertension   . Hyperlipidemia     Past Surgical History:  Procedure Laterality  Date  . BACK SURGERY    . bowel obstruction  01/2009  . CARDIAC CATHETERIZATION  11/2006   patent grafts. Significant OM3 disease and occluded diagonals.  [name] CARDIAC CATHETERIZATION  06/2010   patent grafts. significant ISR in proximal LCX but OM2 is bypassed and gives retrograde flow to OM3.   [name] CARDIAC CATHETERIZATION  06/2013   [facility]: Patent grafts with 60% proximal in-stent restenosis in the left circumflex with FFR of 0.85  . CARDIAC CATHETERIZATION Left 09/25/2015   Procedure: Left Heart Cath and Cors/Grafts Angiography;  Surgeon: Wellington Hampshire, MD;  Location: [place] CV LAB;  Service: Cardiovascular;  Laterality:  Left;  . CERVICAL FUSION    . CHOLECYSTECTOMY    . COLON SURGERY  01/2009   BOWEL RESECTION DUE TO SMALL BOWEL OBSTRUCTION  . COLONOSCOPY    . COLONOSCOPY WITH PROPOFOL N/A 10/28/2017   Procedure: COLONOSCOPY WITH PROPOFOL;  Surgeon: Lollie Sails, MD;  Location: [facility] ENDOSCOPY;  Service: Endoscopy;  Laterality: N/A;  . CORONARY ANGIOPLASTY  2006  . CORONARY ARTERY BYPASS GRAFT  1993/01/2006   redo at [facility]. LIMA to LAD, SVG to OM2 and SVG to RPDA  . CYSTOSCOPY WITH STENT PLACEMENT Bilateral 09/24/2015   Procedure: CYSTOSCOPY WITH BILATERAL RETROGRADES, BILATERAL STENT PLACEMENT;  Surgeon: Festus Aloe, MD;  Location: [facility] ORS;  Service: Urology;  Laterality: Bilateral;  . ESOPHAGOGASTRODUODENOSCOPY N/A 10/25/2014   Procedure: ESOPHAGOGASTRODUODENOSCOPY (EGD);  Surgeon: Lollie Sails, MD;  Location[facility] END[facility]COPY;  Service: Endoscopy;  Laterality: N/A;  . ESOPHAGOGASTRODUODENOSCOPY (EGD) WITH PROPOFOL N/A 02/03/2017   Procedure: ESOPHAGOGASTRODUODENOSCOPY (EGD) WITH PROPOFOL;  Surgeon: Lollie Sails, MD;  Location: [facility] ENDOSCOPY;  Service: Endoscopy;  Laterality: N/A;  . ESOPHAGOGASTRODUODENOSCOPY (EGD) WITH PROPOFOL N/A 04/18/2017   Procedure: ESOPHAGOGASTRODUODENOSCOPY (EGD) WITH PROPOFOL;  Surgeon: Lollie Sails, MD;  Location: [facility] END[facility]COPY;  Service: Endoscopy;  Laterality: N/A;  . EYE SURGERY Bilateral    Cataract Extraction with IOL  . HERNIA REPAIR    . INGUINAL HERNIA REPAIR Bilateral 01/23/2016   Procedure: HERNIA REPAIR INGUINAL ADULT BILATERAL;  Surgeon: Leonie Green, MD;  Location: [facility] ORS;  Service: General;  Laterality: Bilateral;  . LUMBAR LAMINECTOMY/DECOMPRESSION MICRODISCECTOMY Left 10/01/2016   Procedure: Left Lumbar four-five Laminotomy for resection of synovial cyst;  Surgeon: Erline Levine, MD;  Location: [place];  Service: Neurosurgery;  Laterality: Left;  left  . NECK SURGERY  06/2009  . SHOULDER SURGERY  2010    Prior to Admission  medications   Medication Sig Start Date End Date Taking? Authorizing Provider  acetaminophen (TYLENOL) 500 MG tablet Take 500 mg by mouth every 8 (eight) hours as needed for mild pain.  06/09/12   [provider]  albuterol (PROVENTIL HFA;VENTOLIN HFA) 108 (90 Base) MCG/ACT inhaler Inhale into the lungs every 4 (four) hours as needed for wheezing or shortness of breath.    [provider]  aspirin EC 81 MG tablet Take 81 mg by mouth daily.    [provider]  citalopram (CELEXA) 20 MG tablet Take 20 mg by mouth daily.     [provider]  dicyclomine (BENTYL) 10 MG capsule Take 10 mg by mouth 3 (three) times daily. 11/16/19   [provider]  docusate sodium (COLACE) 100 MG capsule Take 100 mg by mouth 2 (two) times daily.    [provider]  fexofenadine (ALLEGRA) 180 MG tablet Take 180 mg by mouth daily.    [provider]  FIBER PO Take  1 tablet by mouth daily.     [provider]  finasteride (PROSCAR) 5 MG tablet Take 5 mg by mouth daily.    [provider]  furosemide (LASIX) 40 MG tablet Take 40 mg by mouth daily as needed for fluid.    [provider]  gabapentin (NEURONTIN) 600 MG tablet Take 600 mg by mouth 3 (three) times daily. 10/07/19   [provider]  gabapentin (NEURONTIN) 800 MG tablet Take 800 mg by mouth 3 (three) times daily.    [provider]  indapamide (LOZOL) 2.5 MG tablet Take 2.5 mg by mouth daily. 10/17/17   [provider]  Methylcellulose, Laxative, 500 MG TABS Take 1 tablet by mouth daily.    [provider]  Multiple Vitamins-Minerals (HAIR/SKIN/NAILS PO) Take 1 tablet by mouth 3 (three) times daily.     [provider]  nitroGLYCERIN (NITROSTAT) 0.4 MG SL tablet Place 0.4 mg under the tongue every 5 (five) minutes as needed for chest pain.     [provider]  pantoprazole (PROTONIX) 40 MG tablet Take 40 mg by mouth 2 (two)  times daily.     [provider]  potassium chloride SA (K-DUR,KLOR-CON) 20 MEQ tablet Take 20 mEq by mouth daily.    [provider]  psyllium (REGULOID) 0.52 g capsule Take 0.52 g by mouth daily.    [provider]  rOPINIRole (REQUIP) 1 MG tablet Take by mouth. 11/19/19   [provider]  rosuvastatin (CRESTOR) 20 MG tablet Take 20 mg by mouth daily.    [provider]  Spacer/Aero-Holding Chambers (AEROCHAMBER MINI CHAMBER) DEVI by Does not apply route as directed.    [provider]  triamcinolone ointment (KENALOG) 0.1 % Apply 1 application topically 2 (two) times daily.  01/01/16   [provider]  vitamin B-12 (CYANOCOBALAMIN) 1000 MCG tablet Take 1,000 mcg by mouth daily.    [provider]    Allergies Amitriptyline, Cyclobenzaprine, Berniece Salines flavor, Fish oil, Lidocaine, Omega-3 fatty acids-vitamin e, Other, and Percocet [oxycodone-acetaminophen]  Family History  Problem Relation Age of Onset  . Heart disease Father 5  . Heart attack Father     Social History Social History   Tobacco Use  . Smoking status: Former Smoker    Packs/day: 2.50    Types: Cigarettes    Quit date: 08/21/1991    Years since quitting: 28.3  . Smokeless tobacco: Never Used  Vaping Use  . Vaping Use: Never used  Substance Use Topics  . Alcohol use: Not Currently  . Drug use: No    Review of Systems Constitutional: No fever/chills Eyes: No visual changes. ENT: No sore throat. Cardiovascular: Positive for chest pain. Respiratory: Positive for shortness of breath. Gastrointestinal: No abdominal pain.  No nausea, no vomiting.  No diarrhea.  No constipation. Genitourinary: Negative for dysuria. Musculoskeletal: Negative for neck pain.  Negative for back pain. Integumentary: Negative for rash. Neurological: Negative for headaches, focal weakness or numbness.  ____________________________________________   PHYSICAL  EXAM:  VITAL SIGNS: ED Triage Vitals  Enc Vitals Group     BP 12/11/19 1225 136/85     Pulse Rate 12/11/19 1225 63     Resp 12/11/19 1225 18     Temp 12/11/19 1225 98.5 F (36.9 C)     Temp Source 12/11/19 1225 Oral     SpO2 12/11/19 1225 97 %     Weight 12/11/19 1226 83.9 kg (185 lb)     Height  12/11/19 1226 1.753 m (5\' 9" )     Head Circumference --      Peak Flow --      Pain Score 12/11/19 1232 6     Pain Loc --      Pain Edu? --      Excl. in [place]? --     Constitutional: Alert and oriented.  Eyes: Conjunctivae are normal.  Head: Atraumatic.[name] Mouth/Throat: Patient is wearing a mask. Neck: No stridor.  No meningeal signs.   Cardiovascular: Normal rate, regular rhythm. Good peripheral circulation. Grossly normal heart sounds. Respiratory: Normal respiratory effort.  No retractions. Gastrointestinal: Soft and nontender. No distention.  Musculoskeletal: No lower extremity tenderness nor edema. No gross deformities of extremities. Neurologic:  Normal speech and language. No gross focal neurologic deficits are appreciated.  Skin:  Skin is warm, dry and intact. Psychiatric: Mood and affect are normal. Speech and behavior are normal.  ____________________________________________   LABS (all labs ordered are listed, but only abnormal results are displayed)  Labs Reviewed  COMPREHENSIVE METABOLIC PANEL - Abnormal; Notable for the following components:      Result Value   Potassium 2.7 (*)    Chloride 95 (*)    Glucose, Bld 113 (*)    AST 45 (*)    All other components within normal limits  BASIC METABOLIC PANEL - Abnormal; Notable for the following components:   Potassium 2.4 (*)    Creatinine, Ser 0.57 (*)    Calcium 7.1 (*)    All other components within normal limits  CBC  TROPONIN I (HIGH SENSITIVITY)  TROPONIN I (HIGH SENSITIVITY)  TROPONIN I (HIGH SENSITIVITY)  TROPONIN I (HIGH SENSITIVITY)   ____________________________________________  EKG  ED ECG  REPORT I, [place] N Jash Wahlen, the attending physician, personally viewed and interpreted this ECG.   Date: 12/11/2019  EKG Time: 12:21 PM  Rate: 63  Rhythm: Normal sinus rhythm  Axis: Normal  Intervals: Normal  ST&T Change: None  ED ECG REPORT I, [place] N Salvador Big[place], the attending physician, personally viewed and interpreted this ECG.   Date: 12/12/2019  EKG Time: 5:06 AM  Rate: 59  Rhythm: Sinus bradycardia  Axis: Normal  Intervals: Normal  ST&T Change: None   ____________________________________________  RADIOLOGY I, [place] N Ted Goodner, personally viewed and evaluated these images (plain radiographs) as part of my medical decision making, as well as reviewing the written report by the radiologist.  ED MD interpretation: Nodular density noted in the left apex on chest x-ray per radiologist.   CT chest revealed no evidence of pulmonary emboli  Official radiology report(s): DG Chest 2 View  Result Date: 12/11/2019 CLINICAL DATA:  Chest pain. EXAM: CHEST - 2 VIEW COMPARISON:  July 31, 2018 FINDINGS: A nodular density projects over the left base. Another nodular density projects over the left upper lobe. Neither of these nodular densities were seen on a CT of the chest July 31, 2018 the heart, hila, and mediastinum are normal. No masses or focal infiltrates. IMPRESSION: 1. A nodular density projects over the left apex. Recommend a CT scan of the chest for better evaluation. 2. The nodular density over the left base could represent a pulmonary nodule or nipple shadow. Recommend attention to this region on the recommended CT scan. Electronically Signed   By: Dorise Bullion III M.D   On: 12/11/2019 13:53   CT Angio Chest PE W/Cm &/Or Wo Cm  Result Date: 12/11/2019 CLINICAL DATA:  Chest pain, nausea, chest pressure EXAM: CT ANGIOGRAPHY CHEST  WITH CONTRAST TECHNIQUE: Multidetector CT imaging of the chest was performed using the standard protocol during bolus administration of intravenous  contrast. Multiplanar CT image reconstructions and MIPs were obtained to evaluate the vascular anatomy. CONTRAST:  65mL OMNIPAQUE IOHEXOL 350 MG/ML SOLN COMPARISON:  07/31/2018 FINDINGS: Cardiovascular: There is adequate opacification of the pulmonary arterial tree. There is no intraluminal filling defect identified to suggest acute pulmonary embolism. The central pulmonary arteries are of normal caliber. Coronary artery bypass grafting has been performed. There is extensive coronary artery calcification as well as evidence of prior stenting involving the native coronary arteries. Cardiac size is at the upper limits of normal. Mild extension of hyperdense contrast across the interatrial septum suggests a atrial septal defect involving the foramen ovale. No pericardial effusion. The thoracic aorta demonstrates mild scattered atherosclerotic calcification and is of normal caliber. Mediastinum/Nodes: No pathologic thoracic adenopathy. Thyroid unremarkable. Esophagus unremarkable. Lungs/Pleura: Lungs are clear. No pneumothorax or pleural effusion. Central airways are widely patent. Upper Abdomen: No acute abnormality. Musculoskeletal: No chest wall abnormality. No acute or significant osseous findings. Review of the MIP images confirms the above findings. IMPRESSION: 1. No evidence of acute pulmonary embolism. Lungs are clear. 2. 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 3. Aortic atherosclerosis. Aortic Atherosclerosis (ICD10-I70.0). Electronically Signed   By: Fidela Salisbury MD   On: 12/11/2019 20:05    ____________________________________________    Procedures   ____________________________________________   INITIAL IMPRESSION / MDM / [place] / ED COURSE  As part of my medical decision making, I reviewed the following data within the electronic MEDICAL RECORD NUMBER   69 year old male presented with above-stated history and physical exam with differential  diagnosis including but not limited to ACS, pulmonary emboli, thoracic aneurysm.  EKG revealed no evidence of ischemia or infarction, laboratory data revealed normal troponin x2.  Patient's potassium initially noted to be 2.7 when the patient arrived to the room repeat was obtained which was 2.4.  Patient was given 40 mEq of p.o. potassium and IV potassium 10 mEq/h x 4 doses ordered.  CT angiogram of the chest revealed no evidence of pulmonary emboli.  Patient with ongoing chest pain at rest and as such plan to admit the patient for further evaluation.  Patient discussed with Dr. Damita Dunnings. ____________________________________________  FINAL CLINICAL IMPRESSION(S) / ED DIAGNOSES  Final diagnoses:  Chest pain, unspecified type     MEDICATIONS GIVEN DURING THIS VISIT:  Medications  morphine 2 MG/ML injection 2 mg (has no administration in time range)  potassium chloride 10 mEq in 100 mL IVPB (has no administration in time range)  potassium chloride SA (KLOR-CON) CR tablet 40 mEq (40 mEq Oral Given 12/11/19 1943)  iohexol (OMNIPAQUE) 350 MG/ML injection 75 mL (75 mLs Intravenous Contrast Given 12/11/19 1946)  potassium chloride SA (KLOR-CON) CR tablet 40 mEq (40 mEq Oral Given 12/12/19 0449)     ED Discharge Orders    None      *Please note:  Willie Clark was evaluated in Emergency Department on 12/12/2019 for the symptoms described in the history of present illness. He was evaluated in the context of the global COVID-19 pandemic, which necessitated consideration that the patient might be at risk for infection with the SARS-CoV-2 virus that causes COVID-19. Institutional protocols and algorithms that pertain to the evaluation of patients at risk for COVID-19 are in a state of rapid change based on information released by regulatory bodies including the CDC and federal and  state organizations. These policies and algorithms were followed during the patient's care in the ED.  Some ED evaluations and  interventions may be delayed as a result of limited staffing during and after the pandemic.*  Note:  This document was prepared using Dragon voice recognition software and may include unintentional dictation errors.   Gregor Hams, MD 12/12/19 [phone]

## 2019-12-12 NOTE — Progress Notes (Signed)
*  PRELIMINARY RESULTS* Echocardiogram 2D Echocardiogram has been performed.  Willie Clark 12/12/2019, 11:57 AM

## 2019-12-12 NOTE — H&P (Signed)
History and Physical    Willie Clark WCH:[id number] DOB: [birth year] DOA: 12/12/2019  PCP: [name], MD   Patient coming from: Home  I have personally briefly reviewed patient's old medical records in [place]  Chief Complaint: Chest pain  HPI: Willie Clark is a 69 year old Caucasian male with a history of coronary artery disease status post CABG x2, status post stent angioplasty, history of chronic systolic heart failure, hypertension, history of PE and obstructive sleep apnea who presents to the emergency room for evaluation of chest pressure which she has had intermittently over the last 4 days.  He initially had symptoms with exertion that was relieved at rest but over the last 24 hours he developed chest pressure at rest associated with nausea but no vomiting.  He initially rated the chest pressure as an 8 x 10 in intensity, nonradiating and over the anterior chest wall.  He continues to have discomfort but rated a 4 x 10 at this time.  He denied having any diaphoresis, shortness of breath or palpitations.  He states that this pain feels different from his prior Mi's. Labs reveal sodium 139, potassium 2.4, chloride 104, bicarb 25, BUN 11, creatinine 0.57, troponin 6 Twelve-lead EKG reviewed by me shows sinus bradycardia with PACs. CT angiogram shows no evidence of acute PE.  Clear lungs.  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 Chest x-ray reviewed by me shows no infiltrate or effusion.   ED Course: Patient is a 69 year old Caucasian male with known history of coronary artery disease who presents to the ER for evaluation of chest pressure which he has had intermittently for 4 days.  His symptoms were initially with exertion but now he has chest pressure at rest.  EKG does not show any acute findings.  Troponin is negative but patient continues to have chest pressure improved from presentation.  Will refer to observation status for further  evaluation.  Review of Systems: As per HPI otherwise 10 point review of systems negative.    Past Medical History:  Diagnosis Date  . (HFpEF) heart failure with preserved ejection fraction ([place])    a. 09/2016 Echo: EF 55-60%, no rwma, triv AI, mild MR, mildly dil LA.  [name] Anxiety   . Arthritis   . Barrett's esophagus   . Bowel obstruction ([place]) 02/2009   small bowel  . Chronic chest pain   . Coronary artery disease    a. 1993 CABG; b. 2007 Redo CABG; c. 09/2015 Cath: LM mild dzs, LAD 100ost, RI mild dzs, LCX 60p, OM1 100, RCA 100ost, VG->OM2 min irregs, LIMA->LAD nl, VG->RPDA 30ost, 20d, nl EF->Med rx.  . Depression   . Diverticulitis   . Diverticulosis   . Erectile dysfunction   . Fibromyalgia   . Gastric ulcer   . Gastritis   . GERD (gastroesophageal reflux disease)   . Headache   . Hemorrhoids   . Hyperlipidemia   . Hypertension   . Kidney stones    going to [facility]  June 26 to see kidney specialist  . Lightheadedness   . Mass on back   . MI (myocardial infarction) ([place])   . Nephrolithiasis   . Orthostatic hypotension    a. Improved after discontinuation of metoprolol.  . OSA (obstructive sleep apnea)    a. On CPAP.  [name] Pulmonary embolism ([place]) 01/2006   Post-op/treated  . Skin lesions, generalized   . Stroke [facility]) 1993   right side weakness no blood  thinners   on Aspirin  . Syncope and collapse   . Urticaria     Past Surgical History:  Procedure Laterality Date  . BACK SURGERY    . bowel obstruction  01/2009  . CARDIAC CATHETERIZATION  11/2006   patent grafts. Significant OM3 disease and occluded diagonals.  [name] CARDIAC CATHETERIZATION  06/2010   patent grafts. significant ISR in proximal LCX but OM2 is bypassed and gives retrograde flow to OM3.   [name] CARDIAC CATHETERIZATION  06/2013   [facility]: Patent grafts with 60% proximal in-stent restenosis in the left circumflex with FFR of 0.85  . CARDIAC CATHETERIZATION Left 09/25/2015   Procedure: Left Heart Cath and  Cors/Grafts Angiography;  Surgeon: Wellington Hampshire, MD;  Location: [place] CV LAB;  Service: Cardiovascular;  Laterality: Left;  . CERVICAL FUSION    . CHOLECYSTECTOMY    . COLON SURGERY  01/2009   BOWEL RESECTION DUE TO SMALL BOWEL OBSTRUCTION  . COLONOSCOPY    . COLONOSCOPY WITH PROPOFOL N/A 10/28/2017   Procedure: COLONOSCOPY WITH PROPOFOL;  Surgeon: Lollie Sails, MD;  Location: [facility] ENDOSCOPY;  Service: Endoscopy;  Laterality: N/A;  . CORONARY ANGIOPLASTY  2006  . CORONARY ARTERY BYPASS GRAFT  1993/01/2006   redo at [facility]. LIMA to LAD, SVG to OM2 and SVG to RPDA  . CYSTOSCOPY WITH STENT PLACEMENT Bilateral 09/24/2015   Procedure: CYSTOSCOPY WITH BILATERAL RETROGRADES, BILATERAL STENT PLACEMENT;  Surgeon: Festus Aloe, MD;  Location: [facility] ORS;  Service: Urology;  Laterality: Bilateral;  . ESOPHAGOGASTRODUODENOSCOPY N/A 10/25/2014   Procedure: ESOPHAGOGASTRODUODENOSCOPY (EGD);  Surgeon: Lollie Sails, MD;  Location: [facility] ENDOSCOPY;  Service: Endoscopy;  Laterality: N/A;  . ESOPHAGOGASTRODUODENOSCOPY (EGD) WITH PROPOFOL N/A 02/03/2017   Procedure: ESOPHAGOGASTRODUODENOSCOPY (EGD) WITH PROPOFOL;  Surgeon: Lollie Sails, MD;  Location: [facility] ENDOSCOPY;  Service: Endoscopy;  Laterality: N/A;  . ESOPHAGOGASTRODUODENOSCOPY (EGD) WITH PROPOFOL N/A 04/18/2017   Procedure: ESOPHAGOGASTRODUODENOSCOPY (EGD) WITH PROPOFOL;  Surgeon: Lollie Sails, MD;  Location: [facility] ENDOSCOPY;  Service: Endoscopy;  Laterality: N/A;  . EYE SURGERY Bilateral    Cataract Extraction with IOL  . HERNIA REPAIR    . INGUINAL HERNIA REPAIR Bilateral 01/23/2016   Procedure: HERNIA REPAIR INGUINAL ADULT BILATERAL;  Surgeon: Leonie Green, MD;  Location: [facility] ORS;  Service: General;  Laterality: Bilateral;  . LUMBAR LAMINECTOMY/DECOMPRESSION MICRODISCECTOMY Left 10/01/2016   Procedure: Left Lumbar four-five Laminotomy for resection of synovial cyst;  Surgeon: Erline Levine, MD;  Location: [place];   Service: Neurosurgery;  Laterality: Left;  left  . NECK SURGERY  06/2009  . SHOULDER SURGERY  2010     reports that he quit smoking about 28 years ago. His smoking use included cigarettes. He smoked 2.50 packs per day. He has never used smokeless tobacco. He reports previous alcohol use. He reports that he does not use drugs.  Allergies  Allergen Reactions  . Amitriptyline Other (See Comments)    Pt unsure of what happens  . Cyclobenzaprine Other (See Comments) and Hives    Pt unsure what hapens  . Berniece Salines Flavor Diarrhea  . Fish Oil Itching  . Lidocaine Nausea And Vomiting, Other (See Comments) and Nausea Only    Nasal Spray (only time had reaction)  . Omega-3 Fatty Acids-Vitamin E Itching  . Other Diarrhea and Itching    Reactive agents: Onions, bacon, steak  . Percocet [Oxycodone-Acetaminophen] Other (See Comments)    Lowers BP    Family History  Problem Relation Age of Onset  . Heart  disease Father 22  . Heart attack Father      Prior to Admission medications   Medication Sig Start Date End Date Taking? Authorizing Provider  acetaminophen (TYLENOL) 500 MG tablet Take 500 mg by mouth every 8 (eight) hours as needed for mild pain.  06/09/12  Yes [provider]  albuterol (PROVENTIL HFA;VENTOLIN HFA) 108 (90 Base) MCG/ACT inhaler Inhale into the lungs every 4 (four) hours as needed for wheezing or shortness of breath.   Yes [provider]  aspirin EC 81 MG tablet Take 81 mg by mouth daily.   Yes [provider]  citalopram (CELEXA) 20 MG tablet Take 20 mg by mouth daily.    Yes [provider]  dicyclomine (BENTYL) 10 MG capsule Take 10 mg by mouth 3 (three) times daily. 11/16/19  Yes [provider]  docusate sodium (COLACE) 100 MG capsule Take 100 mg by mouth 2 (two) times daily.   Yes [provider]  Erenumab-aooe 140 MG/ML SOAJ Inject 140 mg into the skin every 28 (twenty-eight) days.   Yes [provider]    fexofenadine (ALLEGRA) 180 MG tablet Take 180 mg by mouth daily.   Yes [provider]  FIBER PO Take 1 tablet by mouth daily.    Yes [provider]  finasteride (PROSCAR) 5 MG tablet Take 5 mg by mouth daily.   Yes [provider]  furosemide (LASIX) 40 MG tablet Take 40 mg by mouth daily as needed for fluid.   Yes [provider]  gabapentin (NEURONTIN) 600 MG tablet Take 600 mg by mouth 3 (three) times daily. 10/07/19  Yes [provider]  indapamide (LOZOL) 2.5 MG tablet Take 2.5 mg by mouth daily. 10/17/17  Yes [provider]  Methylcellulose, Laxative, 500 MG TABS Take 1 tablet by mouth daily.   Yes [provider]  Multiple Vitamins-Minerals (HAIR/SKIN/NAILS PO) Take 1 tablet by mouth 3 (three) times daily.    Yes [provider]  nitroGLYCERIN (NITROSTAT) 0.4 MG SL tablet Place 0.4 mg under the tongue every 5 (five) minutes as needed for chest pain.    Yes [provider]  pantoprazole (PROTONIX) 40 MG tablet Take 40 mg by mouth 2 (two) times daily.    Yes [provider]  potassium chloride SA (K-DUR,KLOR-CON) 20 MEQ tablet Take 20 mEq by mouth daily.   Yes [provider]  rOPINIRole (REQUIP) 1 MG tablet Take 1 mg by mouth at bedtime.  11/19/19  Yes [provider]  rosuvastatin (CRESTOR) 20 MG tablet Take 20 mg by mouth daily.   Yes [provider]  triamcinolone ointment (KENALOG) 0.1 % Apply 1 application topically 2 (two) times daily.   Yes [provider]  vitamin B-12 (CYANOCOBALAMIN) 1000 MCG tablet Take 1,000 mcg by mouth daily.   Yes [provider]  psyllium (REGULOID) 0.52 g capsule Take 0.52 g by mouth daily. Patient not taking: Reported on 12/12/2019    [provider]    Physical Exam: Vitals:   12/12/19 0630 12/12/19 0645 12/12/19 0700 12/12/19 0715  BP: 129/74  (!) 131/94   Pulse: (!) 56 66 60 86  Resp: 13 (!) 21 15 18    Temp:      TempSrc:      SpO2: 94% 95% 96% 93%  Weight:      Height:         Vitals:   12/12/19 0630 12/12/19 0645 12/12/19 0700 12/12/19 0715  BP: 129/74  [name])  131/94   Pulse: (!) 56 66 60 86  Resp: 13 (!) 21 15 18   Temp:      TempSrc:      SpO2: 94% 95% 96% 93%  Weight:      Height:        Constitutional: NAD, alert and oriented x 3 Eyes: PERRL, lids and conjunctivae normal ENMT: Mucous membranes are moist.  Neck: normal, supple, no masses, no thyromegaly Respiratory: clear to auscultation bilaterally, no wheezing, no crackles. Normal respiratory effort. No accessory muscle use.  Cardiovascular:  Bradycardia, no murmurs / rubs / gallops. No extremity edema. 2+ pedal pulses. No carotid bruits.  Abdomen: no tenderness, no masses palpated. No hepatosplenomegaly. Bowel sounds positive.  Musculoskeletal: no clubbing / cyanosis. No joint deformity upper and lower extremities.  Skin: no rashes, lesions, ulcers.  Neurologic: No gross focal neurologic deficit. Psychiatric: Normal mood and affect.   Labs on Admission: I have personally reviewed following labs and imaging studies  CBC: Recent Labs  Lab 12/11/19 1237  WBC 6.9  HGB 14.4  HCT 42.7  MCV 99.1  PLT 998   Basic Metabolic Panel: Recent Labs  Lab 12/11/19 1237 12/12/19 0448  NA 138 139  K 2.7* 2.4*  CL 95* 104  CO2 30 25  GLUCOSE 113* 86  BUN 14 11  CREATININE 0.87 0.57*  CALCIUM 9.0 7.1*   GFR: Estimated Creatinine Clearance: 88.4 mL/min (A) (by C-G formula based on SCr of 0.57 mg/dL (L)). Liver Function Tests: Recent Labs  Lab 12/11/19 1237  AST 45*  ALT 36  ALKPHOS 51  BILITOT 1.0  PROT 7.1  ALBUMIN 4.6   No results for input(s): LIPASE, AMYLASE in the last 168 hours. No results for input(s): AMMONIA in the last 168 hours. Coagulation Profile: No results for input(s): INR, PROTIME in the last 168 hours. Cardiac Enzymes: No results for input(s): CKTOTAL, CKMB, CKMBINDEX, TROPONINI in the  last 168 hours. BNP (last 3 results) No results for input(s): PROBNP in the last 8760 hours. HbA1C: No results for input(s): HGBA1C in the last 72 hours. CBG: No results for input(s): GLUCAP in the last 168 hours. Lipid Profile: No results for input(s): CHOL, HDL, LDLCALC, TRIG, CHOLHDL, LDLDIRECT in the last 72 hours. Thyroid Function Tests: No results for input(s): TSH, T4TOTAL, FREET4, T3FREE, THYROIDAB in the last 72 hours. Anemia Panel: No results for input(s): VITAMINB12, FOLATE, FERRITIN, TIBC, IRON, RETICCTPCT in the last 72 hours. Urine analysis:    Component Value Date/Time   COLORURINE STRAW (A) 08/01/2018 0133   APPEARANCEUR CLEAR (A) 08/01/2018 0133   LABSPEC 1.010 08/01/2018 0133   PHURINE 7.0 08/01/2018 0133   GLUCOSEU NEGATIVE 08/01/2018 0133   HGBUR NEGATIVE 08/01/2018 0133   BILIRUBINUR NEGATIVE 08/01/2018 0133   KETONESUR NEGATIVE 08/01/2018 0133   PROTEINUR NEGATIVE 08/01/2018 0133   NITRITE NEGATIVE 08/01/2018 0133   LEUKOCYTESUR NEGATIVE 08/01/2018 0133    Radiological Exams on Admission: DG Chest 2 View  Result Date: 12/11/2019 CLINICAL DATA:  Chest pain. EXAM: CHEST - 2 VIEW COMPARISON:  July 31, 2018 FINDINGS: A nodular density projects over the left base. Another nodular density projects over the left upper lobe. Neither of these nodular densities were seen on a CT of the chest July 31, 2018 the heart, hila, and mediastinum are normal. No masses or focal infiltrates. IMPRESSION: 1. A nodular density projects over the left apex. Recommend a CT scan of the chest for better evaluation. 2. The nodular density over the left base  could represent a pulmonary nodule or nipple shadow. Recommend attention to this region on the recommended CT scan. Electronically Signed   By: Dorise Bullion III M.D   On: 12/11/2019 13:53   CT Angio Chest PE W/Cm &/Or Wo Cm  Result Date: 12/11/2019 CLINICAL DATA:  Chest pain, nausea, chest pressure EXAM: CT ANGIOGRAPHY CHEST WITH  CONTRAST TECHNIQUE: Multidetector CT imaging of the chest was performed using the standard protocol during bolus administration of intravenous contrast. Multiplanar CT image reconstructions and MIPs were obtained to evaluate the vascular anatomy. CONTRAST:  73mL OMNIPAQUE IOHEXOL 350 MG/ML SOLN COMPARISON:  07/31/2018 FINDINGS: Cardiovascular: There is adequate opacification of the pulmonary arterial tree. There is no intraluminal filling defect identified to suggest acute pulmonary embolism. The central pulmonary arteries are of normal caliber. Coronary artery bypass grafting has been performed. There is extensive coronary artery calcification as well as evidence of prior stenting involving the native coronary arteries. Cardiac size is at the upper limits of normal. Mild extension of hyperdense contrast across the interatrial septum suggests a atrial septal defect involving the foramen ovale. No pericardial effusion. The thoracic aorta demonstrates mild scattered atherosclerotic calcification and is of normal caliber. Mediastinum/Nodes: No pathologic thoracic adenopathy. Thyroid unremarkable. Esophagus unremarkable. Lungs/Pleura: Lungs are clear. No pneumothorax or pleural effusion. Central airways are widely patent. Upper Abdomen: No acute abnormality. Musculoskeletal: No chest wall abnormality. No acute or significant osseous findings. Review of the MIP images confirms the above findings. IMPRESSION: 1. No evidence of acute pulmonary embolism. Lungs are clear. 2. 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 3. Aortic atherosclerosis. Aortic Atherosclerosis (ICD10-I70.0). Electronically Signed   By: Fidela Salisbury MD   On: 12/11/2019 20:05    EKG: Independently reviewed.  Sinus bradycardia PACs  Assessment/Plan Principal Problem:   Unstable angina (HCC) Active Problems:   Coronary artery disease   Chronic combined systolic and diastolic congestive heart failure  (HCC)   Hypokalemia   Depression     Unstable angina In a patient with known coronary artery disease status post CABG x2 as well as stent angioplasty who presents to the ER for evaluation of chest pressure which is intermittent and initially occurred with exertion but now he has symptoms at rest associated with nausea. Serial cardiac enzymes are negative Twelve-lead EKG shows sinus bradycardia with no acute ST-T wave changes Continue aspirin and statins Patient not on beta-blocker due to bradycardia Obtain 2D echocardiogram Request cardiology consult   Chronic combined systolic and diastolic dysfunction CHF Last known LVEF 40 to 45% Stable and not in acute exacerbation Hold diuretic therapy for now due to severe hypokalemia   Hypokalemia Secondary to diuretic use Potassium levels 2.4 mEq Aggressive potassium supplementation obtain magnesium level Hold diuretic therapy for now   Depression Continue Celexa    DVT prophylaxis: Lovenox Code Status: Full code Family Communication: Greater than 50% of time was spent discussing plan of care with patient at bedside.  He verbalizes understanding and agrees with the plan. Disposition Plan: Back to previous home environment Consults called: Cardiology    [name] MD [facility]     12/12/2019, 8:47 AM

## 2019-12-13 ENCOUNTER — Encounter: Payer: Self-pay | Admitting: Cardiovascular Disease

## 2019-12-13 ENCOUNTER — Telehealth: Payer: Self-pay | Admitting: Cardiovascular Disease

## 2019-12-13 ENCOUNTER — Encounter
Admission: EM | Disposition: A | Discharge status: Home / Self Care | Payer: Self-pay | Source: Home / Self Care | Attending: Emergency Medicine

## 2019-12-13 DIAGNOSIS — I5022 Chronic systolic (congestive) heart failure: Secondary | ICD-10-CM

## 2019-12-13 DIAGNOSIS — I200 Unstable angina: Secondary | ICD-10-CM | POA: Diagnosis not present

## 2019-12-13 DIAGNOSIS — E785 Hyperlipidemia, unspecified: Secondary | ICD-10-CM | POA: Diagnosis not present

## 2019-12-13 DIAGNOSIS — I25119 Atherosclerotic heart disease of native coronary artery with unspecified angina pectoris: Secondary | ICD-10-CM | POA: Diagnosis not present

## 2019-12-13 HISTORY — PX: LEFT HEART CATH AND CORS/GRAFTS ANGIOGRAPHY: CATH118250

## 2019-12-13 LAB — BASIC METABOLIC PANEL
Anion gap: 11 (ref 5–15)
BUN: 9 mg/dL (ref 8–23)
CO2: 24 mmol/L (ref 22–32)
Calcium: 8.7 mg/dL — ABNORMAL LOW (ref 8.9–10.3)
Chloride: 103 mmol/L (ref 98–111)
Creatinine, Ser: 0.62 mg/dL (ref 0.61–1.24)
GFR calc Af Amer: >60 mL/min (ref >60)
GFR calc non Af Amer: >60 mL/min (ref >60)
Glucose, Bld: 96 mg/dL (ref 70–99)
Potassium: 3.6 mmol/L (ref 3.5–5.1)
Sodium: 138 mmol/L (ref 135–145)

## 2019-12-13 LAB — HIV ANTIBODY (ROUTINE TESTING W REFLEX): HIV Screen 4th Generation wRfx: NONREACTIVE

## 2019-12-13 LAB — LIPID PANEL
Cholesterol: 181 mg/dL (ref 0–200)
HDL: 35 mg/dL — ABNORMAL LOW (ref >40)
LDL Cholesterol: 84 mg/dL (ref 0–99)
Total CHOL/HDL Ratio: 5.2 RATIO
Triglycerides: 312 mg/dL — ABNORMAL HIGH (ref <150)
VLDL: 62 mg/dL — ABNORMAL HIGH (ref 0–40)

## 2019-12-13 LAB — ECHOCARDIOGRAM COMPLETE
AR max vel: 4.01 cm2
AV Peak grad: 3 mmHg
Ao pk vel: 0.87 m/s
Area-P 1/2: 3.34 cm2
S' Lateral: 3.28 cm

## 2019-12-13 SURGERY — LEFT HEART CATH AND CORS/GRAFTS ANGIOGRAPHY
Anesthesia: Moderate Sedation

## 2019-12-13 MED ORDER — LOSARTAN POTASSIUM 25 MG PO TABS: 25.0000 mg | ORAL_TABLET | Freq: Every day | ORAL | 0 refills | Status: DC

## 2019-12-13 MED ORDER — SODIUM CHLORIDE 0.9% FLUSH: 3.0000 mL | Freq: Two times a day (BID) | INTRAVENOUS | Status: DC

## 2019-12-13 MED ORDER — LIDOCAINE HCL (PF) 1 % IJ SOLN
INTRAMUSCULAR | Status: AC
  Filled 2019-12-13: qty 30

## 2019-12-13 MED ORDER — SODIUM CHLORIDE 0.9 % IV SOLN: INTRAVENOUS | Status: DC

## 2019-12-13 MED ORDER — MIDAZOLAM HCL 2 MG/2ML IJ SOLN
INTRAMUSCULAR | Status: DC | PRN
  Administered 2019-12-13: 1 mg via INTRAVENOUS

## 2019-12-13 MED ORDER — SODIUM CHLORIDE 0.9% FLUSH: 3.0000 mL | INTRAVENOUS | Status: DC | PRN

## 2019-12-13 MED ORDER — SODIUM CHLORIDE 0.9 % IV SOLN: 250.0000 mL | INTRAVENOUS | Status: DC | PRN

## 2019-12-13 MED ORDER — FENTANYL CITRATE (PF) 100 MCG/2ML IJ SOLN
INTRAMUSCULAR | Status: DC | PRN
Start: 2019-12-13 — End: 2019-12-13
  Administered 2019-12-13: 50 ug via INTRAVENOUS

## 2019-12-13 MED ORDER — VERAPAMIL HCL 2.5 MG/ML IV SOLN
INTRAVENOUS | Status: DC | PRN
  Administered 2019-12-13: 2.5 mg via INTRA_ARTERIAL

## 2019-12-13 MED ORDER — CARVEDILOL 3.125 MG PO TABS
3.1250 mg | ORAL_TABLET | Freq: Two times a day (BID) | ORAL | Status: DC
  Filled 2019-12-13 (×2): qty 1

## 2019-12-13 MED ORDER — VERAPAMIL HCL 2.5 MG/ML IV SOLN
INTRAVENOUS | Status: AC
  Filled 2019-12-13: qty 2

## 2019-12-13 MED ORDER — ROSUVASTATIN CALCIUM 10 MG PO TABS
40.0000 mg | ORAL_TABLET | Freq: Every day | ORAL | Status: DC
  Filled 2019-12-13: qty 2

## 2019-12-13 MED ORDER — HEPARIN (PORCINE) IN NACL 1000-0.9 UT/500ML-% IV SOLN
INTRAVENOUS | Status: DC | PRN
  Administered 2019-12-13: 500 mL

## 2019-12-13 MED ORDER — HEPARIN (PORCINE) IN NACL 1000-0.9 UT/500ML-% IV SOLN
INTRAVENOUS | Status: AC
  Filled 2019-12-13: qty 1000

## 2019-12-13 MED ORDER — LIDOCAINE HCL (PF) 1 % IJ SOLN
INTRAMUSCULAR | Status: DC | PRN
  Administered 2019-12-13: 2 mL

## 2019-12-13 MED ORDER — LOSARTAN POTASSIUM 25 MG PO TABS
25.0000 mg | ORAL_TABLET | Freq: Every day | ORAL | Status: DC
  Administered 2019-12-13: 25 mg via ORAL
  Filled 2019-12-13 (×3): qty 1

## 2019-12-13 MED ORDER — ASPIRIN EC 81 MG PO TBEC
DELAYED_RELEASE_TABLET | ORAL | Status: AC
  Filled 2019-12-13: qty 1

## 2019-12-13 MED ORDER — CARVEDILOL 3.125 MG PO TABS: 3.1250 mg | ORAL_TABLET | Freq: Two times a day (BID) | ORAL | 0 refills | Status: DC

## 2019-12-13 MED ORDER — IOHEXOL 300 MG/ML  SOLN
INTRAMUSCULAR | Status: DC | PRN
  Administered 2019-12-13: 55 mL

## 2019-12-13 MED ORDER — MIDAZOLAM HCL 2 MG/2ML IJ SOLN
INTRAMUSCULAR | Status: AC
  Filled 2019-12-13: qty 2

## 2019-12-13 MED ORDER — FENTANYL CITRATE (PF) 100 MCG/2ML IJ SOLN
INTRAMUSCULAR | Status: AC
  Filled 2019-12-13: qty 2

## 2019-12-13 MED ORDER — HEPARIN SODIUM (PORCINE) 1000 UNIT/ML IJ SOLN
INTRAMUSCULAR | Status: AC
  Filled 2019-12-13: qty 1

## 2019-12-13 MED ORDER — SODIUM CHLORIDE 0.9 % IV SOLN: INTRAVENOUS | Status: AC

## 2019-12-13 SURGICAL SUPPLY — 9 items
CATH INFINITI 5 FR LCB (CATHETERS) ×2 IMPLANT
CATH INFINITI 5FR JL4 (CATHETERS) ×2 IMPLANT
CATH INFINITI JR4 5F (CATHETERS) ×2 IMPLANT
DEVICE RAD TR BAND REGULAR (VASCULAR PRODUCTS) ×2 IMPLANT
GLIDESHEATH SLEND SS 6F .021 (SHEATH) ×2 IMPLANT
GUIDEWIRE INQWIRE 1.5J.035X260 (WIRE) IMPLANT
INQWIRE 1.5J .035X260CM (WIRE) ×3
KIT MANI 3VAL PERCEP (MISCELLANEOUS) ×3 IMPLANT
PACK CARDIAC CATH (CUSTOM PROCEDURE TRAY) ×3 IMPLANT

## 2019-12-13 NOTE — Progress Notes (Signed)
Patient to specials for procedure.

## 2019-12-13 NOTE — Telephone Encounter (Signed)
Patient wife wants to discuss fu  After cath   and also notes in chart saying  he has a hole in his heart

## 2019-12-13 NOTE — ED Notes (Signed)
Patient ambulated to bathroom.

## 2019-12-13 NOTE — Progress Notes (Signed)
Report called to Joy, RN

## 2019-12-13 NOTE — ED Notes (Signed)
Patient ambulated to the bathroom.

## 2019-12-13 NOTE — Interval H&P Note (Signed)
History and Physical Interval Note:  12/13/2019 9:06 AM  Willie Clark  has presented today for surgery, with the diagnosis of Unstable angina.  The various methods of treatment have been discussed with the patient and family. After consideration of risks, benefits and other options for treatment, the patient has consented to  Procedure(s): LEFT HEART CATH AND CORS/GRAFTS ANGIOGRAPHY (N/A) as a surgical intervention.  The patient's history has been reviewed, patient examined, no change in status, stable for surgery.  I have reviewed the patient's chart and labs.  Questions were answered to the patient's satisfaction.     [name]

## 2019-12-13 NOTE — Progress Notes (Signed)
Progress Note  Patient Name: Willie Clark Date of Encounter: 12/13/2019  [facility] Cardiologist: [name], MD   Subjective   No further chest pain or nausea.  Inpatient Medications    Scheduled Meds: . aspirin EC      . [MAR Hold] aspirin EC  81 mg Oral Daily  . carvedilol  3.125 mg Oral BID WC  . [MAR Hold] citalopram  20 mg Oral Daily  . [MAR Hold] dicyclomine  10 mg Oral TID  . [MAR Hold] docusate sodium  100 mg Oral BID  . [MAR Hold] enoxaparin (LOVENOX) injection  40 mg Subcutaneous Q24H  . [MAR Hold] finasteride  5 mg Oral Daily  . [MAR Hold] gabapentin  600 mg Oral TID  . [MAR Hold] loratadine  10 mg Oral Daily  . losartan  25 mg Oral Daily  . [MAR Hold] pantoprazole  40 mg Oral BID  . [MAR Hold] polycarbophil  625 mg Oral Daily  . [MAR Hold] psyllium  1 packet Oral Daily  . [MAR Hold] rOPINIRole  1 mg Oral QHS  . [MAR Hold] rosuvastatin  20 mg Oral Daily  . sodium chloride flush  3 mL Intravenous Q12H  . [MAR Hold] triamcinolone ointment  1 application Topical BID  . [MAR Hold] vitamin B-12  1,000 mcg Oral Daily   Continuous Infusions: . sodium chloride    . sodium chloride     PRN Meds: sodium chloride, [MAR Hold] acetaminophen, [MAR Hold] albuterol, [MAR Hold] nitroGLYCERIN, [MAR Hold] ondansetron (ZOFRAN) IV, sodium chloride flush   Vital Signs    Vitals:   12/13/19 0859 12/13/19 0950 12/13/19 1000 12/13/19 1015  BP:  (!) 150/78 137/77 (!) 147/77  Pulse:  62 (!) 55 (!) 58  Resp:  13 15 14   Temp:      TempSrc:      SpO2: 100% 98% 95% 95%  Weight:      Height:       No intake or output data in the 24 hours ending 12/13/19 1048 Last 3 Weights 12/11/2019 09/14/2019 09/04/2018  Weight (lbs) 185 lb 191 lb 187 lb 4 oz  Weight (kg) 83.915 kg 86.637 kg 84.936 kg      Telemetry    Normal sinus rhythm with intermittent sinus bradycardia- Personally Reviewed  ECG    No EKG today- Personally Reviewed  Physical Exam   GEN: No acute  distress.   Neck: No JVD Cardiac: RRR, no murmurs, rubs, or gallops.  Respiratory: Clear to auscultation bilaterally. GI: Soft, nontender, non-distended  MS: No edema; No deformity. Neuro:  Nonfocal  Psych: Normal affect   Labs    High Sensitivity Troponin:   Recent Labs  Lab 12/11/19 1237 12/11/19 1938 12/12/19 0448 12/12/19 0651  TROPONINIHS 6 8 6 5       Chemistry Recent Labs  Lab 12/11/19 1237 12/11/19 1237 12/12/19 0448 12/12/19 1832 12/13/19 0350  NA 138   < > 139 137 138  K 2.7*   < > 2.4* 3.2* 3.6  CL 95*   < > 104 101 103  CO2 30   < > 25 25 24   GLUCOSE 113*   < > 86 136* 96  BUN 14   < > 11 10 9   CREATININE 0.87   < > 0.57* 0.68 0.62  CALCIUM 9.0   < > 7.1* 8.5* 8.7*  PROT 7.1  --   --   --   --   ALBUMIN 4.6  --   --   --   --  AST 45*  --   --   --   --   ALT 36  --   --   --   --   ALKPHOS 51  --   --   --   --   BILITOT 1.0  --   --   --   --   GFRNONAA >60   < > >60 >60 >60  GFRAA >60   < > >60 >60 >60  ANIONGAP 13   < > 10 11 11    < > = values in this interval not displayed.     Hematology Recent Labs  Lab 12/11/19 1237  WBC 6.9  RBC 4.31  HGB 14.4  HCT 42.7  MCV 99.1  MCH 33.4  MCHC 33.7  RDW 13.1  PLT 167    BNPNo results for input(s): BNP, PROBNP in the last 168 hours.   DDimer No results for input(s): DDIMER in the last 168 hours.   Radiology    DG Chest 2 View  Result Date: 12/11/2019 CLINICAL DATA:  Chest pain. EXAM: CHEST - 2 VIEW COMPARISON:  July 31, 2018 FINDINGS: A nodular density projects over the left base. Another nodular density projects over the left upper lobe. Neither of these nodular densities were seen on a CT of the chest July 31, 2018 the heart, hila, and mediastinum are normal. No masses or focal infiltrates. IMPRESSION: 1. A nodular density projects over the left apex. Recommend a CT scan of the chest for better evaluation. 2. The nodular density over the left base could represent a pulmonary nodule or  nipple shadow. Recommend attention to this region on the recommended CT scan. Electronically Signed   By: Dorise Bullion III M.D   On: 12/11/2019 13:53   CT Angio Chest PE W/Cm &/Or Wo Cm  Result Date: 12/11/2019 CLINICAL DATA:  Chest pain, nausea, chest pressure EXAM: CT ANGIOGRAPHY CHEST WITH CONTRAST TECHNIQUE: Multidetector CT imaging of the chest was performed using the standard protocol during bolus administration of intravenous contrast. Multiplanar CT image reconstructions and MIPs were obtained to evaluate the vascular anatomy. CONTRAST:  81mL OMNIPAQUE IOHEXOL 350 MG/ML SOLN COMPARISON:  07/31/2018 FINDINGS: Cardiovascular: There is adequate opacification of the pulmonary arterial tree. There is no intraluminal filling defect identified to suggest acute pulmonary embolism. The central pulmonary arteries are of normal caliber. Coronary artery bypass grafting has been performed. There is extensive coronary artery calcification as well as evidence of prior stenting involving the native coronary arteries. Cardiac size is at the upper limits of normal. Mild extension of hyperdense contrast across the interatrial septum suggests a atrial septal defect involving the foramen ovale. No pericardial effusion. The thoracic aorta demonstrates mild scattered atherosclerotic calcification and is of normal caliber. Mediastinum/Nodes: No pathologic thoracic adenopathy. Thyroid unremarkable. Esophagus unremarkable. Lungs/Pleura: Lungs are clear. No pneumothorax or pleural effusion. Central airways are widely patent. Upper Abdomen: No acute abnormality. Musculoskeletal: No chest wall abnormality. No acute or significant osseous findings. Review of the MIP images confirms the above findings. IMPRESSION: 1. No evidence of acute pulmonary embolism. Lungs are clear. 2. 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 3. Aortic atherosclerosis. Aortic Atherosclerosis (ICD10-I70.0).  Electronically Signed   By: Fidela Salisbury MD   On: 12/11/2019 20:05   CARDIAC CATHETERIZATION  Result Date: 12/13/2019  Colon Flattery LAD lesion is 100% stenosed.  Ost 1st Mrg to 1st Mrg lesion is 100% stenosed.  The left ventricular ejection fraction is 35-45% by  visual estimate.  There is moderate left ventricular systolic dysfunction.  LV end diastolic pressure is mildly elevated.  Ost RCA to Prox RCA lesion is 100% stenosed.  SVG graft was visualized by angiography and is moderate in size.  The graft exhibits mild .  LIMA graft was visualized by angiography and is normal in caliber.  The graft exhibits no disease.  2nd RPL lesion is 100% stenosed.  3rd RPL lesion is 100% stenosed.  SVG graft was visualized by angiography and is normal in caliber.  The graft exhibits mild .  2nd Mrg lesion is 100% stenosed.  Prox Cx to Mid Cx lesion is 40% stenosed.  1.  Severe underlying three-vessel coronary artery disease with patent grafts including LIMA to LAD, SVG to OM and SVG to right PDA.  Patent stent in the native mid left circumflex with moderate in-stent restenosis.  No significant change in coronary anatomy since most recent cardiac catheterization in 2017.  The patient does have diffuse small vessel disease. 2.  Moderately reduced LV systolic function with an EF of 35 to 40% with mildly elevated left ventricular end-diastolic pressure at 15 mmHg. Recommendations: Continue medical therapy for coronary artery disease and chronic systolic heart failure.  The patient is not able to tolerate long-acting nitroglycerin due to headaches. For cardiomyopathy, I discontinued amlodipine and indapamide and will start him on small dose carvedilol and losartan.  We could consider switching to [facility] as an outpatient.   ECHOCARDIOGRAM COMPLETE  Result Date: 12/13/2019    ECHOCARDIOGRAM REPORT   Patient Name:   Willie Clark Date of Exam: 12/12/2019 Medical Rec #:  [id number]     Height:       69.0 in Accession #:     [id number]    Weight:       185.0 lb Date of Birth:  12-Jan-1951     BSA:          1.999 m Patient Age:    69 years      BP:           131/94 mmHg Patient Gender: M             HR:           86 bpm. Exam Location:  [facility] Procedure: 2D Echo, Cardiac Doppler and Color Doppler Indications:     Chest Pain 786.50 / R07.9  History:         Patient has prior history of Echocardiogram examinations.                  Stroke, Signs/Symptoms:Syncope; Risk Factors:Hypertension.  Sonographer:     Alyse Low Roar Referring Phys:  [place] Diagnosing Phys: [name] MD IMPRESSIONS  1. Left ventricular ejection fraction, by estimation, is 40 to 45%. The left ventricle has mildly decreased function. Left ventricular endocardial border not optimally defined to evaluate regional wall motion. There is mild left ventricular hypertrophy.  Left ventricular diastolic parameters are consistent with Grade I diastolic dysfunction (impaired relaxation).  2. Right ventricular systolic function is normal. The right ventricular size is normal. There is normal pulmonary artery systolic pressure. The estimated right ventricular systolic pressure is 63.1 mmHg.  3. Left atrial size was mildly dilated.  4. The mitral valve is normal in structure. No evidence of mitral valve regurgitation. No evidence of mitral stenosis.  5. The aortic valve is normal in structure. Aortic valve regurgitation is mild. Mild aortic valve sclerosis is present, with no evidence of  aortic valve stenosis. FINDINGS  Left Ventricle: Left ventricular ejection fraction, by estimation, is 40 to 45%. The left ventricle has mildly decreased function. Left ventricular endocardial border not optimally defined to evaluate regional wall motion. The left ventricular internal cavity size was normal in size. There is mild left ventricular hypertrophy. Left ventricular diastolic parameters are consistent with Grade I diastolic dysfunction (impaired relaxation). Right Ventricle:  The right ventricular size is normal. No increase in right ventricular wall thickness. Right ventricular systolic function is normal. There is normal pulmonary artery systolic pressure. The tricuspid regurgitant velocity is 2.28 m/s, and  with an assumed right atrial pressure of 5 mmHg, the estimated right ventricular systolic pressure is 25.0 mmHg. Left Atrium: Left atrial size was mildly dilated. Right Atrium: Right atrial size was normal in size. Pericardium: There is no evidence of pericardial effusion. Mitral Valve: The mitral valve is normal in structure. Normal mobility of the mitral valve leaflets. No evidence of mitral valve regurgitation. No evidence of mitral valve stenosis. Tricuspid Valve: The tricuspid valve is normal in structure. Tricuspid valve regurgitation is mild . No evidence of tricuspid stenosis. Aortic Valve: The aortic valve is normal in structure. Aortic valve regurgitation is mild. Mild aortic valve sclerosis is present, with no evidence of aortic valve stenosis. Aortic valve peak gradient measures 3.0 mmHg. Pulmonic Valve: The pulmonic valve was normal in structure. Pulmonic valve regurgitation is trivial. No evidence of pulmonic stenosis. Aorta: The aortic root is normal in size and structure. Venous: The inferior vena cava was not well visualized. IAS/Shunts: No atrial level shunt detected by color flow Doppler.  LEFT VENTRICLE PLAX 2D LVIDd:         4.09 cm  Diastology LVIDs:         3.28 cm  LV e' lateral:   4.90 cm/s LV PW:         1.19 cm  LV E/e' lateral: 8.5 LV IVS:        1.12 cm  LV e' medial:    4.35 cm/s LVOT diam:     2.20 cm  LV E/e' medial:  9.6 LVOT Area:     3.80 cm  RIGHT VENTRICLE RV Mid diam:    3.31 cm RV S prime:     9.03 cm/s TAPSE (M-mode): 1.8 cm LEFT ATRIUM             Index       RIGHT ATRIUM           Index LA diam:        4.00 cm 2.00 cm/m  RA Area:     16.30 cm LA Vol (A2C):   64.1 ml 32.07 ml/m RA Volume:   46.90 ml  23.47 ml/m LA Vol (A4C):   61.1 ml  30.57 ml/m LA Biplane Vol: 62.6 ml 31.32 ml/m  AORTIC VALVE               PULMONIC VALVE AV Area (Vmax): 4.01 cm   PV Vmax:        1.21 m/s AV Vmax:        86.90 cm/s PV Peak grad:   5.9 mmHg AV Peak Grad:   3.0 mmHg   RVOT Peak grad: 1 mmHg LVOT Vmax:      91.70 cm/s  AORTA Ao Root diam: 3.40 cm MITRAL VALVE               TRICUSPID VALVE MV Area (PHT): 3.34 cm    TR Peak  grad:   20.8 mmHg MV Decel Time: 227 msec    TR Vmax:        228.00 cm/s MV E velocity: 41.60 cm/s MV A velocity: 62.60 cm/s  SHUNTS MV E/A ratio:  0.66        Systemic Diam: 2.20 cm MV A Prime:    11.6 cm/s [name] MD Electronically signed by [name] MD Signature Date/Time: 12/13/2019/7:49:51 AM    Final     Cardiac Studies   Echocardiogram was done yesterday and was personally reviewed by me: 1. Left ventricular ejection fraction, by estimation, is 40 to 45%. The  left ventricle has mildly decreased function. Left ventricular endocardial  border not optimally defined to evaluate regional wall motion. There is  mild left ventricular hypertrophy.  Left ventricular diastolic parameters are consistent with Grade I  diastolic dysfunction (impaired relaxation).  2. Right ventricular systolic function is normal. The right ventricular  size is normal. There is normal pulmonary artery systolic pressure. The  estimated right ventricular systolic pressure is 30.0 mmHg.  3. Left atrial size was mildly dilated.  4. The mitral valve is normal in structure. No evidence of mitral valve  regurgitation. No evidence of mitral stenosis.  5. The aortic valve is normal in structure. Aortic valve regurgitation is  mild. Mild aortic valve sclerosis is present, with no evidence of aortic  valve stenosis.   Patient Profile     69 y.o. male  a hx of significant prior CAD/mild HFrEF s/p multiple PCI's and CABG who presented with worsening chest pain and suspected unstable angina.     Assessment & Plan    1. Coronary artery  disease involving bypass graft with angina pectoris: No evidence of myocardial infarction his troponin remained normal.  Cardiac catheterization was performed this morning by me via the left radial artery which showed severe underlying three-vessel coronary artery disease with patent grafts and no significant change in coronary anatomy since 2017.  He does have small vessel disease. Recommend continuing medical therapy. Continue low-dose aspirin and statin.  2.  Chronic systolic heart failure: EF appears slightly lower at 35 to 40%.  Due to that, I discontinued amlodipine and indapamide.  I switched to carvedilol 3.125 mg twice daily and added small dose losartan 25 mg once daily.  He might not be able to tolerate a higher dose beta-blocker given underlying bradycardia. We can consider switching him to [facility] as an outpatient.  3.  Hyperlipidemia: Lipid profile showed an LDL of 84.  He also has elevated triglyceride.  I am going to increase rosuvastatin to 40 mg daily and if LDL remains above 70 upon follow-up, we can add Zetia and possibly Vascepa if triglyceride remains elevated.  The patient can likely be discharged home later in the afternoon if no other issues with the above changes in his medications.     For questions or updates, please contact [place] Please consult [email or web address] for contact info under        Signed, [name], MD  12/13/2019, 10:48 AM

## 2019-12-13 NOTE — Discharge Summary (Signed)
Physician Discharge Summary  Willie Clark:[id number] DOB: October 15, 1950 DOA: 12/12/2019  PCP: [name], MD  Admit date: 12/12/2019 Discharge date: 12/13/2019  Time spent: 25 minutes  Discharge Diagnoses:  Principal Problem:   Unstable angina [facility]) Active Problems:   Coronary artery disease involving native coronary artery of native heart with angina pectoris (HCC)   Chronic combined systolic and diastolic congestive heart failure (HCC)   Hypokalemia   Depression    Unstable angina In a patient with known coronary artery disease status post CABG x2 as well as stent angioplasty who presents to the ER for evaluation of chest pressure which is intermittent and initially occurred with exertion but now he has symptoms at rest associated with nausea. Serial cardiac enzymes are negative Twelve-lead EKG shows sinus bradycardia with no acute ST-T wave changes Continue aspirin and statins Patient not on beta-blocker due to bradycardia Obtain 2D echocardiogram Request cardiology consult- stable cardiology consulted and heart cath and was negative.   Chronic combined systolic and diastolic dysfunction CHF Last known LVEF 40 to 45% Stable and not in acute exacerbation Hold diuretic therapy for now due to severe hypokalemia Give potassium supplementation when taking lasix.   Hypokalemia Secondary to diuretic use Potassium levels 2.4 mEq Aggressive potassium supplementation obtain magnesium level Hold diuretic therapy for now. Resume prn diuretic therapy.   Depression Continue Celexa  Discharge Condition:  Stable  Diet recommendation:  Cardiac Diet.   Filed Weights   12/11/19 1226 12/13/19 1310  Weight: 83.9 kg 83.6 kg    History of present illness:  Willie Clark is a 69 year old Caucasian male with a history of coronary artery disease status post CABG x2, status post stent angioplasty, history of chronic systolic heart failure, hypertension, history of PE and  obstructive sleep apnea who presents to the emergency room for evaluation of chest pressure which she has had intermittently over the last 4 days.  He initially had symptoms with exertion that was relieved at rest but over the last 24 hours he developed chest pressure at rest associated with nausea but no vomiting.  He initially rated the chest pressure as an 8 x 10 in intensity, nonradiating and over the anterior chest wall.  He continues to have discomfort but rated a 4 x 10 at this time.  He denied having any diaphoresis, shortness of breath or palpitations.  He states that this pain feels different from his prior Mi's. Labs reveal sodium 139, potassium 2.4, chloride 104, bicarb 25, BUN 11, creatinine 0.57, troponin 6 Twelve-lead EKG reviewed by me shows sinus bradycardia with PACs. CT angiogram shows no evidence of acute PE.  Clear lungs.  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 Chest x-ray reviewed by me shows no infiltrate or effusion. Patient is a 69 year old Caucasian male with known history of coronary artery disease who presents to the ER for evaluation of chest pressure which he has had intermittently for 4 days.  His symptoms were initially with exertion but now he has chest pressure at rest.  EKG does not show any acute findings.  Troponin is negative but patient continues to have chest pressure improved from presentation.  Will refer to observation status for further evaluation.  Hospital Course:  Pt seen for chest  Pain post Left heart cath was found to be negative. Pt has tolerated procedure well post procedure has done well is eating no complaints or any symptoms. D/W him about his results and found to have ASD on  CTA and d/w pt to make appt with his PCP and cardiothoracic surgeon and discuss with them plan for ASD as it puts him at high risk for  CVA. HE has h/o one cva and previous TIA.   Procedures: Left Heart  Cath.  Consultations: Cardiology-Dr. Fletcher Anon.   Discharge Exam: Vitals:   12/13/19 1310 12/13/19 1619  BP: (!) 143/82 (!) 136/92  Pulse: 71 69  Resp: 18 17  Temp: 98.3 F (36.8 C) 98.1 F (36.7 C)  SpO2: 98% 98%    Physical Exam Vitals and nursing note reviewed.  Constitutional:      Appearance: Normal appearance.  HENT:     Head: Normocephalic and atraumatic.     Nose: Nose normal.     Mouth/Throat:     Mouth: Mucous membranes are dry.  Eyes:     Extraocular Movements: Extraocular movements intact.     Pupils: Pupils are equal, round, and reactive to light.  Cardiovascular:     Rate and Rhythm: Normal rate and regular rhythm.     Pulses: Normal pulses.  Pulmonary:     Effort: Pulmonary effort is normal.     Breath sounds: Normal breath sounds.  Abdominal:     Palpations: Abdomen is soft.  Musculoskeletal:        General: Normal range of motion.     Cervical back: Normal range of motion.  Skin:    General: Skin is warm.  Neurological:     General: No focal deficit present.     Mental Status: He is alert and oriented to person, place, and time.  Psychiatric:        Mood and Affect: Mood normal.        Behavior: Behavior normal.    Discharge Instructions Discharge Instructions    Call MD for:  difficulty breathing, headache or visual disturbances   Complete by: As directed    Call MD for:  hives   Complete by: As directed    Call MD for:  persistant dizziness or light-headedness   Complete by: As directed    Call MD for:  persistant nausea and vomiting   Complete by: As directed    Call MD for:  severe uncontrolled pain   Complete by: As directed    Call MD for:  temperature >100.4   Complete by: As directed    Diet - low sodium heart healthy   Complete by: As directed    Discharge instructions   Complete by: As directed    Make F/U appt with cardiothoracic appt for ASD.   Increase activity slowly   Complete by: As directed      Allergies as of  12/13/2019      Reactions   Amitriptyline Other (See Comments)   Pt unsure of what happens   Cyclobenzaprine Other (See Comments), Hives   Pt unsure what hapens   Berniece Salines Flavor Diarrhea   Fish Oil Itching   Lidocaine Nausea And Vomiting, Other (See Comments), Nausea Only   Nasal Spray (only time had reaction)   Omega-3 Fatty Acids-vitamin E Itching   Other Diarrhea, Itching   Reactive agents: Onions, bacon, steak   Percocet [oxycodone-acetaminophen] Other (See Comments)   Lowers BP      Medication List    STOP taking these medications   acetaminophen 500 MG tablet Commonly known as: TYLENOL     TAKE these medications   albuterol 108 (90 Base) MCG/ACT inhaler Commonly known as: VENTOLIN HFA Inhale into the lungs  every 4 (four) hours as needed for wheezing or shortness of breath.   aspirin EC 81 MG tablet Take 81 mg by mouth daily.   carvedilol 3.125 MG tablet Commonly known as: COREG Take 1 tablet (3.125 mg total) by mouth 2 (two) times daily with a meal.   citalopram 20 MG tablet Commonly known as: CELEXA Take 20 mg by mouth daily.   dicyclomine 10 MG capsule Commonly known as: BENTYL Take 10 mg by mouth 3 (three) times daily.   docusate sodium 100 MG capsule Commonly known as: COLACE Take 100 mg by mouth 2 (two) times daily.   Erenumab-aooe 140 MG/ML Soaj Inject 140 mg into the skin every 28 (twenty-eight) days.   fexofenadine 180 MG tablet Commonly known as: ALLEGRA Take 180 mg by mouth daily.   FIBER PO Take 1 tablet by mouth daily.   finasteride 5 MG tablet Commonly known as: PROSCAR Take 5 mg by mouth daily.   furosemide 40 MG tablet Commonly known as: LASIX Take 40 mg by mouth daily as needed for fluid.   gabapentin 600 MG tablet Commonly known as: NEURONTIN Take 600 mg by mouth 3 (three) times daily.   HAIR/SKIN/NAILS PO Take 1 tablet by mouth 3 (three) times daily.   indapamide 2.5 MG tablet Commonly known as: LOZOL Take 2.5 mg by mouth  daily.   losartan 25 MG tablet Commonly known as: COZAAR Take 1 tablet (25 mg total) by mouth daily. Start taking on: December 14, 2019   Methylcellulose (Laxative) 500 MG Tabs Take 1 tablet by mouth daily.   nitroGLYCERIN 0.4 MG SL tablet Commonly known as: NITROSTAT Place 0.4 mg under the tongue every 5 (five) minutes as needed for chest pain.   pantoprazole 40 MG tablet Commonly known as: PROTONIX Take 40 mg by mouth 2 (two) times daily.   potassium chloride SA 20 MEQ tablet Commonly known as: KLOR-CON Take 20 mEq by mouth daily.   psyllium 0.52 g capsule Commonly known as: REGULOID Take 0.52 g by mouth daily.   rOPINIRole 1 MG tablet Commonly known as: REQUIP Take 1 mg by mouth at bedtime.   rosuvastatin 20 MG tablet Commonly known as: CRESTOR Take 20 mg by mouth daily.   triamcinolone ointment 0.1 % Commonly known as: KENALOG Apply 1 application topically 2 (two) times daily.   vitamin B-12 1000 MCG tablet Commonly known as: CYANOCOBALAMIN Take 1,000 mcg by mouth daily.      Allergies  Allergen Reactions  . Amitriptyline Other (See Comments)    Pt unsure of what happens  . Cyclobenzaprine Other (See Comments) and Hives    Pt unsure what hapens  . Berniece Salines Flavor Diarrhea  . Fish Oil Itching  . Lidocaine Nausea And Vomiting, Other (See Comments) and Nausea Only    Nasal Spray (only time had reaction)  . Omega-3 Fatty Acids-Vitamin E Itching  . Other Diarrhea and Itching    Reactive agents: Onions, bacon, steak  . Percocet [Oxycodone-Acetaminophen] Other (See Comments)    Lowers BP    Follow-up Information    Burns, Harriett P, MD Follow up in 1 week(s).   Specialty: Internal Medicine Contact information: [place] [address] [phone]        Wellington Hampshire, MD .   Specialty: Cardiology Contact information: [place] [address] [phone]                The results of  significant diagnostics  from this hospitalization (including imaging, microbiology, ancillary and laboratory) are listed below for reference.    Significant Diagnostic Studies: DG Chest 2 View  Result Date: 12/11/2019 CLINICAL DATA:  Chest pain. EXAM: CHEST - 2 VIEW COMPARISON:  July 31, 2018 FINDINGS: A nodular density projects over the left base. Another nodular density projects over the left upper lobe. Neither of these nodular densities were seen on a CT of the chest July 31, 2018 the heart, hila, and mediastinum are normal. No masses or focal infiltrates. IMPRESSION: 1. A nodular density projects over the left apex. Recommend a CT scan of the chest for better evaluation. 2. The nodular density over the left base could represent a pulmonary nodule or nipple shadow. Recommend attention to this region on the recommended CT scan. Electronically Signed   By: Dorise Bullion III M.D   On: 12/11/2019 13:53   CT Angio Chest PE W/Cm &/Or Wo Cm  Result Date: 12/11/2019 CLINICAL DATA:  Chest pain, nausea, chest pressure EXAM: CT ANGIOGRAPHY CHEST WITH CONTRAST TECHNIQUE: Multidetector CT imaging of the chest was performed using the standard protocol during bolus administration of intravenous contrast. Multiplanar CT image reconstructions and MIPs were obtained to evaluate the vascular anatomy. CONTRAST:  52mL OMNIPAQUE IOHEXOL 350 MG/ML SOLN COMPARISON:  07/31/2018 FINDINGS: Cardiovascular: There is adequate opacification of the pulmonary arterial tree. There is no intraluminal filling defect identified to suggest acute pulmonary embolism. The central pulmonary arteries are of normal caliber. Coronary artery bypass grafting has been performed. There is extensive coronary artery calcification as well as evidence of prior stenting involving the native coronary arteries. Cardiac size is at the upper limits of normal. Mild extension of hyperdense contrast across the interatrial septum suggests a atrial septal  defect involving the foramen ovale. No pericardial effusion. The thoracic aorta demonstrates mild scattered atherosclerotic calcification and is of normal caliber. Mediastinum/Nodes: No pathologic thoracic adenopathy. Thyroid unremarkable. Esophagus unremarkable. Lungs/Pleura: Lungs are clear. No pneumothorax or pleural effusion. Central airways are widely patent. Upper Abdomen: No acute abnormality. Musculoskeletal: No chest wall abnormality. No acute or significant osseous findings. Review of the MIP images confirms the above findings. IMPRESSION: 1. No evidence of acute pulmonary embolism. Lungs are clear. 2. 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 3. Aortic atherosclerosis. Aortic Atherosclerosis (ICD10-I70.0). Electronically Signed   By: Fidela Salisbury MD   On: 12/11/2019 20:05   CARDIAC CATHETERIZATION  Result Date: 12/13/2019  Colon Flattery LAD lesion is 100% stenosed.  Ost 1st Mrg to 1st Mrg lesion is 100% stenosed.  The left ventricular ejection fraction is 35-45% by visual estimate.  There is moderate left ventricular systolic dysfunction.  LV end diastolic pressure is mildly elevated.  Ost RCA to Prox RCA lesion is 100% stenosed.  SVG graft was visualized by angiography and is moderate in size.  The graft exhibits mild .  LIMA graft was visualized by angiography and is normal in caliber.  The graft exhibits no disease.  2nd RPL lesion is 100% stenosed.  3rd RPL lesion is 100% stenosed.  SVG graft was visualized by angiography and is normal in caliber.  The graft exhibits mild .  2nd Mrg lesion is 100% stenosed.  Prox Cx to Mid Cx lesion is 40% stenosed.  1.  Severe underlying three-vessel coronary artery disease with patent grafts including LIMA to LAD, SVG to OM and SVG to right PDA.  Patent stent in the native mid left circumflex with moderate in-stent restenosis.  No  significant change in coronary anatomy since most recent cardiac catheterization in  2017.  The patient does have diffuse small vessel disease. 2.  Moderately reduced LV systolic function with an EF of 35 to 40% with mildly elevated left ventricular end-diastolic pressure at 15 mmHg. Recommendations: Continue medical therapy for coronary artery disease and chronic systolic heart failure.  The patient is not able to tolerate long-acting nitroglycerin due to headaches. For cardiomyopathy, I discontinued amlodipine and indapamide and will start him on small dose carvedilol and losartan.  We could consider switching to [facility] as an outpatient.   ECHOCARDIOGRAM COMPLETE  Result Date: 12/13/2019    ECHOCARDIOGRAM REPORT   Patient Name:   Willie Clark Date of Exam: 12/12/2019 Medical Rec #:  [id number]     Height:       69.0 in Accession #:    [id number]    Weight:       185.0 lb Date of Birth:  [birth year]     BSA:          1.999 m Patient Age:    69 years      BP:           131/94 mmHg Patient Gender: M             HR:           86 bpm. Exam Location:  [facility] Procedure: 2D Echo, Cardiac Doppler and Color Doppler Indications:     Chest Pain 786.50 / R07.9  History:         Patient has prior history of Echocardiogram examinations.                  Stroke, Signs/Symptoms:Syncope; Risk Factors:Hypertension.  Sonographer:     Alyse Low Roar Referring Phys:  [place] Diagnosing Phys: [name] MD IMPRESSIONS  1. Left ventricular ejection fraction, by estimation, is 40 to 45%. The left ventricle has mildly decreased function. Left ventricular endocardial border not optimally defined to evaluate regional wall motion. There is mild left ventricular hypertrophy.  Left ventricular diastolic parameters are consistent with Grade I diastolic dysfunction (impaired relaxation).  2. Right ventricular systolic function is normal. The right ventricular size is normal. There is normal pulmonary artery systolic pressure. The estimated right ventricular systolic pressure is 02.4 mmHg.  3. Left atrial size  was mildly dilated.  4. The mitral valve is normal in structure. No evidence of mitral valve regurgitation. No evidence of mitral stenosis.  5. The aortic valve is normal in structure. Aortic valve regurgitation is mild. Mild aortic valve sclerosis is present, with no evidence of aortic valve stenosis. FINDINGS  Left Ventricle: Left ventricular ejection fraction, by estimation, is 40 to 45%. The left ventricle has mildly decreased function. Left ventricular endocardial border not optimally defined to evaluate regional wall motion. The left ventricular internal cavity size was normal in size. There is mild left ventricular hypertrophy. Left ventricular diastolic parameters are consistent with Grade I diastolic dysfunction (impaired relaxation). Right Ventricle: The right ventricular size is normal. No increase in right ventricular wall thickness. Right ventricular systolic function is normal. There is normal pulmonary artery systolic pressure. The tricuspid regurgitant velocity is 2.28 m/s, and  with an assumed right atrial pressure of 5 mmHg, the estimated right ventricular systolic pressure is 09.7 mmHg. Left Atrium: Left atrial size was mildly dilated. Right Atrium: Right atrial size was normal in size. Pericardium: There is no evidence of pericardial effusion. Mitral Valve: The mitral valve is  normal in structure. Normal mobility of the mitral valve leaflets. No evidence of mitral valve regurgitation. No evidence of mitral valve stenosis. Tricuspid Valve: The tricuspid valve is normal in structure. Tricuspid valve regurgitation is mild . No evidence of tricuspid stenosis. Aortic Valve: The aortic valve is normal in structure. Aortic valve regurgitation is mild. Mild aortic valve sclerosis is present, with no evidence of aortic valve stenosis. Aortic valve peak gradient measures 3.0 mmHg. Pulmonic Valve: The pulmonic valve was normal in structure. Pulmonic valve regurgitation is trivial. No evidence of pulmonic  stenosis. Aorta: The aortic root is normal in size and structure. Venous: The inferior vena cava was not well visualized. IAS/Shunts: No atrial level shunt detected by color flow Doppler.  LEFT VENTRICLE PLAX 2D LVIDd:         4.09 cm  Diastology LVIDs:         3.28 cm  LV e' lateral:   4.90 cm/s LV PW:         1.19 cm  LV E/e' lateral: 8.5 LV IVS:        1.12 cm  LV e' medial:    4.35 cm/s LVOT diam:     2.20 cm  LV E/e' medial:  9.6 LVOT Area:     3.80 cm  RIGHT VENTRICLE RV Mid diam:    3.31 cm RV S prime:     9.03 cm/s TAPSE (M-mode): 1.8 cm LEFT ATRIUM             Index       RIGHT ATRIUM           Index LA diam:        4.00 cm 2.00 cm/m  RA Area:     16.30 cm LA Vol (A2C):   64.1 ml 32.07 ml/m RA Volume:   46.90 ml  23.47 ml/m LA Vol (A4C):   61.1 ml 30.57 ml/m LA Biplane Vol: 62.6 ml 31.32 ml/m  AORTIC VALVE               PULMONIC VALVE AV Area (Vmax): 4.01 cm   PV Vmax:        1.21 m/s AV Vmax:        86.90 cm/s PV Peak grad:   5.9 mmHg AV Peak Grad:   3.0 mmHg   RVOT Peak grad: 1 mmHg LVOT Vmax:      91.70 cm/s  AORTA Ao Root diam: 3.40 cm MITRAL VALVE               TRICUSPID VALVE MV Area (PHT): 3.34 cm    TR Peak grad:   20.8 mmHg MV Decel Time: 227 msec    TR Vmax:        228.00 cm/s MV E velocity: 41.60 cm/s MV A velocity: 62.60 cm/s  SHUNTS MV E/A ratio:  0.66        Systemic Diam: 2.20 cm MV A Prime:    11.6 cm/s [name] MD Electronically signed by [name] MD Signature Date/Time: 12/13/2019/7:49:51 AM    Final     Microbiology: Recent Results (from the past 240 hour(s))  SARS Coronavirus 2 by RT PCR (hospital order, performed in [place] hospital lab) Nasopharyngeal Nasopharyngeal Swab     Status: None   Collection Time: 12/12/19  9:38 AM   Specimen: Nasopharyngeal Swab  Result Value Ref Range Status   SARS Coronavirus 2 NEGATIVE NEGATIVE Final    Comment: (NOTE) SARS-CoV-2 target nucleic acids are NOT DETECTED.  The SARS-CoV-2 RNA is generally detectable in upper  and lower respiratory specimens during the acute phase of infection. The lowest concentration of SARS-CoV-2 viral copies this assay can detect is 250 copies / mL. A negative result does not preclude SARS-CoV-2 infection and should not be used as the sole basis for treatment or other patient management decisions.  A negative result may occur with improper specimen collection / handling, submission of specimen other than nasopharyngeal swab, presence of viral mutation(s) within the areas targeted by this assay, and inadequate number of viral copies (<250 copies / mL). A negative result must be combined with clinical observations, patient history, and epidemiological information.  Fact Sheet for Patients:   StrictlyIdeas.no  Fact Sheet for Healthcare Providers: BankingDealers.co.za  This test is not yet approved or  cleared by the [place] FDA and has been authorized for detection and/or diagnosis of SARS-CoV-2 by FDA under an Emergency Use Authorization (EUA).  This EUA will remain in effect (meaning this test can be used) for the duration of the COVID-19 declaration under Section 564(b)(1) of the Act, 21 U.S.C. section 360bbb-3(b)(1), unless the authorization is terminated or revoked sooner.  Performed at [facility], [address] [place] [address]      Labs: Basic Metabolic Panel: Recent Labs  Lab 12/11/19 1237 12/12/19 0448 12/12/19 1832 12/13/19 0350  NA 138 139 137 138  K 2.7* 2.4* 3.2* 3.6  CL 95* 104 101 103  CO2 30 25 25 24   GLUCOSE 113* 86 136* 96  BUN 14 11 10 9   CREATININE 0.87 0.57* 0.68 0.62  CALCIUM 9.0 7.1* 8.5* 8.7*  MG  --   --  2.7*  --    Liver Function Tests: Recent Labs  Lab 12/11/19 1237  AST 45*  ALT 36  ALKPHOS 51  BILITOT 1.0  PROT 7.1  ALBUMIN 4.6   No results for input(s): LIPASE, AMYLASE in the last 168 hours. No results for input(s): AMMONIA in the last 168  hours. CBC: Recent Labs  Lab 12/11/19 1237  WBC 6.9  HGB 14.4  HCT 42.7  MCV 99.1  PLT 167   Cardiac Enzymes: No results for input(s): CKTOTAL, CKMB, CKMBINDEX, TROPONINI in the last 168 hours. BNP: BNP (last 3 results) No results for input(s): BNP in the last 8760 hours.  ProBNP (last 3 results) No results for input(s): PROBNP in the last 8760 hours.  CBG: No results for input(s): GLUCAP in the last 168 hours.   Signed:  Para Skeans MD.  [facility] 12/13/2019, 4:53 PM

## 2019-12-14 LAB — HEMOGLOBIN A1C
Hgb A1c MFr Bld: 6 % — ABNORMAL HIGH (ref 4.8–5.6)
Mean Plasma Glucose: 126 mg/dL

## 2019-12-14 NOTE — Telephone Encounter (Signed)
DPR on file. Spoke with the patients wife. The patients post hosp f/u has been schedule with Laurann [place], NP on 12/20/19. She will attend that appt with the patient.  Mrs. Bothwell sts that the patient had a PFO closure back in 2007. One of the physicians yesterday during the patients hospital stay  mentioned that the patient had a hole in his heart. Adv the patients wife that on my review of the patients recent echos I see no mentioning of that. They can discuss further with Caitlin at the patients upcoming appt.  Patients wife verbalized understanding and voiced appreciation for the call.

## 2019-12-14 NOTE — Telephone Encounter (Signed)
Scheduled 8/30  At 0830 am walker

## 2019-12-16 ENCOUNTER — Other Ambulatory Visit: Payer: Self-pay

## 2019-12-16 ENCOUNTER — Ambulatory Visit: Payer: Medicare HMO | Admitting: Dermatology

## 2019-12-16 DIAGNOSIS — L578 Other skin changes due to chronic exposure to nonionizing radiation: Secondary | ICD-10-CM | POA: Diagnosis not present

## 2019-12-16 DIAGNOSIS — L821 Other seborrheic keratosis: Secondary | ICD-10-CM

## 2019-12-16 DIAGNOSIS — I8310 Varicose veins of unspecified lower extremity with inflammation: Secondary | ICD-10-CM

## 2019-12-16 DIAGNOSIS — D692 Other nonthrombocytopenic purpura: Secondary | ICD-10-CM

## 2019-12-16 DIAGNOSIS — M898X0 Other specified disorders of bone, multiple sites: Secondary | ICD-10-CM | POA: Diagnosis not present

## 2019-12-16 DIAGNOSIS — L820 Inflamed seborrheic keratosis: Secondary | ICD-10-CM

## 2019-12-16 DIAGNOSIS — M898X9 Other specified disorders of bone, unspecified site: Secondary | ICD-10-CM

## 2019-12-16 NOTE — Patient Instructions (Signed)

## 2019-12-16 NOTE — Progress Notes (Signed)
   New Patient Visit  Subjective  Willie Clark is a 69 y.o. male who presents for the following: Growths (nose, left cheek, knees, elbows, scalp, back. Growing. Spots on face are painful and itchy. Growths on elbow and knees are sore at times.).  The following portions of the chart were reviewed this encounter and updated as appropriate:  Tobacco  Allergies  Meds  Problems  Med Hx  Surg Hx  Fam Hx     Review of Systems:  No other skin or systemic complaints except as noted in HPI or Assessment and Plan.  Objective  Well appearing patient in no apparent distress; mood and affect are within normal limits.  A focused examination was performed including face, trunk, extremitites, scalp. Relevant physical exam findings are noted in the Assessment and Plan.  Objective  Face x 4, L post shoulder x 1, back x 7, R elbow x 4, L elbow x 4, Scalp x 1 (21): Erythematous keratotic or waxy stuck-on papule or plaque.   Objective  Right and Left Knee, Right Elbow: Firm nodules.   Assessment & Plan   Actinic Damage - diffuse scaly erythematous macules with underlying dyspigmentation - Recommend daily broad spectrum sunscreen SPF 30+ to sun-exposed areas, reapply every 2 hours as needed.  - Call for new or changing lesions.  Seborrheic Keratoses - Stuck-on, waxy, tan-brown papules and plaques  - Discussed benign etiology and prognosis. - Observe - Call for any changes   Varicose Veins - Dilated blue, purple or red veins at the lower extremities - Reassured - These can be treated by sclerotherapy (a procedure to inject a medicine into the veins to make them disappear) if desired, but the treatment is not covered by insurance  Purpura - Violaceous macules and patches - Benign - Related to age, sun damage and/or use of blood thinners - Observe - Can use OTC arnica containing moisturizer such as Dermend Bruise Formula if desired - Call for worsening or other concerns  Inflamed  seborrheic keratosis (21) Face x 4, L post shoulder x 1, back x 7, R elbow x 4, L elbow x 4, Scalp x 1  Destruction of lesion - Face x 4, L post shoulder x 1, back x 7, R elbow x 4, L elbow x 4, Scalp x 1 Complexity: simple   Destruction method: cryotherapy   Informed consent: discussed and consent obtained   Timeout:  patient name, date of birth, surgical site, and procedure verified Lesion destroyed using liquid nitrogen: Yes   Region frozen until ice ball extended beyond lesion: Yes   Outcome: patient tolerated procedure well with no complications   Post-procedure details: wound care instructions given    Bony prominence Right and Left Knee, Right Elbow  Advised patient  these areas are benign and not harmful. He should see his PCP for further evaluation.   Return if symptoms worsen or fail to improve.   Lindi Adie, CMA, am acting as scribe for Sarina Ser, MD .  Documentation: I have reviewed the above documentation for accuracy and completeness, and I agree with the above.  Sarina Ser, MD

## 2019-12-20 ENCOUNTER — Encounter: Payer: Self-pay | Admitting: Family

## 2019-12-20 ENCOUNTER — Other Ambulatory Visit: Payer: Self-pay

## 2019-12-20 ENCOUNTER — Ambulatory Visit (INDEPENDENT_AMBULATORY_CARE_PROVIDER_SITE_OTHER): Payer: Medicare HMO | Admitting: Family

## 2019-12-20 VITALS — BP 140/76 | HR 64 | Ht 69.0 in | Wt 193.0 lb

## 2019-12-20 DIAGNOSIS — I25118 Atherosclerotic heart disease of native coronary artery with other forms of angina pectoris: Secondary | ICD-10-CM | POA: Diagnosis not present

## 2019-12-20 DIAGNOSIS — I10 Essential (primary) hypertension: Secondary | ICD-10-CM | POA: Diagnosis not present

## 2019-12-20 DIAGNOSIS — Q211 Atrial septal defect: Secondary | ICD-10-CM

## 2019-12-20 DIAGNOSIS — E785 Hyperlipidemia, unspecified: Secondary | ICD-10-CM

## 2019-12-20 DIAGNOSIS — I5042 Chronic combined systolic (congestive) and diastolic (congestive) heart failure: Secondary | ICD-10-CM

## 2019-12-20 DIAGNOSIS — I25708 Atherosclerosis of coronary artery bypass graft(s), unspecified, with other forms of angina pectoris: Secondary | ICD-10-CM

## 2019-12-20 DIAGNOSIS — Q2112 Patent foramen ovale: Secondary | ICD-10-CM

## 2019-12-20 MED ORDER — ENTRESTO 24-26 MG PO TABS: 1.0000 | ORAL_TABLET | Freq: Two times a day (BID) | ORAL | 5 refills | Status: DC

## 2019-12-20 MED ORDER — ROSUVASTATIN CALCIUM 40 MG PO TABS
40.0000 mg | ORAL_TABLET | Freq: Every day | ORAL | 1 refills | Status: DC
Start: 2019-12-20 — End: 2020-05-19

## 2019-12-20 NOTE — Patient Instructions (Addendum)
Medication Instructions:  Your provider has recommended you make the following change in your medication:   CHANGE Rosuvastatin to 40mg  daily  STOP Losartan  START Entresto 24-26mg  twice daily   *If you need a refill on your cardiac medications before your next appointment, please call your pharmacy*  Lab Work: Your provider recommends lab work today: BMP  If you have labs (blood work) drawn today and your tests are completely normal, you will receive your results only by: [name] MyChart Message (if you have MyChart) OR . A paper copy in the mail If you have any lab test that is abnormal or we need to change your treatment, we will call you to review the results.  Testing/Procedures: Your EKG today showed normal sinus rhythm. This is a great result!  Follow-Up: At [facility], you and your health needs are our priority.  As part of our continuing mission to provide you with exceptional heart care, we have created designated Provider Care Teams.  These Care Teams include your primary Cardiologist (physician) and Advanced Practice Providers (APPs -  Physician Assistants and Nurse Practitioners) who all work together to provide you with the care you need, when you need it.  We recommend signing up for the patient portal called "MyChart".  Sign up information is provided on this After Visit Summary.  MyChart is used to connect with patients for Virtual Visits (Telemedicine).  Patients are able to view lab/test results, encounter notes, upcoming appointments, etc.  Non-urgent messages can be sent to your provider as well.   To learn more about what you can do with MyChart, go to [email or web address].    Your next appointment:   3 week(s)  The format for your next appointment:   In Person  Provider:   You may see [name], MD or one of the following Advanced Practice Providers on your designated Care Team:    Murray Hodgkins, NP  Christell Faith, PA-C  Marrianne Mood,  PA-C  Laurann Montana, NP  Other Instructions  Laurann Montana, NP will talk to Dr. Fletcher Anon about your CT findings and ensure we don't need to do any additional testing. She will give you a call tomorrow.  Sacubitril; Valsartan Oral Tablets What is this medicine? SACUBITRIL; VALSARTAN (sak UE bi tril; val SAR tan) is a combination of a neprilysin inhibitor and a an angiotensin II receptor blocker. It treats heart failure. This medicine may be used for other purposes; ask your health care provider or pharmacist if you have questions. COMMON BRAND NAME(S): Entresto What should I tell my health care provider before I take this medicine? They need to know if you have any of these conditions:  diabetes and take a medicine that contains aliskiren  kidney disease  liver disease  an unusual or allergic reaction to sacubitril; valsartan, drugs called angiotensin converting enzyme (ACE) inhibitors, angiotensin II receptor blockers (ARBs), other medicines, foods, dyes, or preservatives  pregnant or trying to get pregnant  breast-feeding How should I use this medicine? Take this drug by mouth. Take it as directed on the prescription label at the same time every day. You can take it with or without food. If it upsets your stomach, take it with food. Keep taking it unless your health care provider tells you to stop. Talk to your health care provider about the use of this drug in children. While it may be prescribed for children as young as 1 for selected conditions, precautions do apply. Overdosage: If you think you have  taken too much of this medicine contact a poison control center or emergency room at once. NOTE: This medicine is only for you. Do not share this medicine with others. What if I miss a dose? If you miss a dose, take it as soon as you can. If it is almost time for your next dose, take only that dose. Do not take double or extra doses. What may interact with this medicine? Do not take  this medicine with any of the following medicines:  aliskiren if you have diabetes  angiotensin-converting enzyme (ACE) inhibitors, like benazepril, captopril, enalapril, fosinopril, lisinopril, or ramipril This medicine may also interact with the following medicines:  angiotensin II receptor blockers (ARBs) like azilsartan, candesartan, eprosartan, irbesartan, losartan, olmesartan, telmisartan, or valsartan  lithium  NSAIDS, medicines for pain and inflammation, like ibuprofen or naproxen  potassium-sparing diuretics like amiloride, spironolactone, and triamterene  potassium supplements This list may not describe all possible interactions. Give your health care provider a list of all the medicines, herbs, non-prescription drugs, or dietary supplements you use. Also tell them if you smoke, drink alcohol, or use illegal drugs. Some items may interact with your medicine. What should I watch for while using this medicine? Tell your doctor or healthcare professional if your symptoms do not start to get better or if they get worse. Do not become pregnant while taking this medicine. Women should inform their doctor if they wish to become pregnant or think they might be pregnant. There is a potential for serious side effects to an unborn child. Talk to your health care professional or pharmacist for more information. You may get dizzy. Do not drive, use machinery, or do anything that needs mental alertness until you know how this medicine affects you. Do not stand or sit up quickly, especially if you are an older patient. This reduces the risk of dizzy or fainting spells. Avoid alcoholic drinks; they can make you more dizzy. What side effects may I notice from receiving this medicine? Side effects that you should report to your doctor or health care professional as soon as possible:  allergic reactions like skin rash, itching or hives, swelling of the face, lips, or tongue  signs and symptoms of  increased potassium like muscle weakness; chest pain; or fast, irregular heartbeat  signs and symptoms of kidney injury like trouble passing urine or change in the amount of urine  signs and symptoms of low blood pressure like feeling dizzy or lightheaded, or if you develop extreme fatigue Side effects that usually do not require medical attention (report to your doctor or health care professional if they continue or are bothersome):  cough This list may not describe all possible side effects. Call your doctor for medical advice about side effects. You may report side effects to FDA at 1-800-FDA-1088. Where should I keep my medicine? Keep out of the reach of children and pets. Store at room temperature between 20 and 25 degrees C (68 and 77 degrees F). Protect from moisture. Keep the container tightly closed. Throw away any unused drug after the expiration date. NOTE: This sheet is a summary. It may not cover all possible information. If you have questions about this medicine, talk to your doctor, pharmacist, or health care provider.  2020 [employer]/Gold Standard (2018-11-11 16:03:07)  Blood Pressure Log   Date   Time  Blood Pressure  Position  Example: Nov 1 9 AM 124/78 sitting

## 2019-12-20 NOTE — Progress Notes (Signed)
Office Visit    Patient Name: Willie Clark Date of Encounter: 12/20/2019  Primary Care Provider:  [name], MD Primary Cardiologist:  [name], MD Electrophysiologist:  None   Chief Complaint    NOACH Clark is a 69 y.o. male with a hx of CAD (CABG 1993, redo CABG 2007, LHC 12/13/19 patent graft, patent stent with moderate ISR, and diffuse small vessel disease stable compared to 2017) mild HFrEF, HLD, hTN, GERD, nephrolithiasis, OSA, stroke, prior postoperative PE, PFO closure 2007 per patient report presents today for hospital follow up.    Past Medical History    Past Medical History:  Diagnosis Date  . Anxiety   . Arthritis   . Barrett's esophagus   . Bowel obstruction ([place]) 02/2009   small bowel  . Chronic chest pain   . Coronary artery disease    a. 1993 CABG; b. 2007 Redo CABG; c. 09/2015 Cath: LM mild dzs, LAD 100ost, RI mild dzs, LCX 60p, OM1 100, RCA 100ost, VG->OM2 min irregs, LIMA->LAD nl, VG->RPDA 30ost, 20d, nl EF->Med rx.  . Depression   . Diverticulitis   . Diverticulosis   . Erectile dysfunction   . Fibromyalgia   . Gastric ulcer   . Gastritis   . GERD (gastroesophageal reflux disease)   . Headache   . Hemorrhoids   . HFrEF (heart failure with reduced ejection fraction) ([place])    a. 09/2016 Echo: EF 55-60%, no rwma, triv AI, mild MR, mildly dil LA. -> as of April 2020: EF 40 to 45%.  Septal dyssynergy/hypokinesis due to postop state but otherwise unable to assess wall motion [name] due to poor study.  . History of myocardial infarct at age less than 71 years   . Hyperlipidemia   . Hypertension   . Kidney stones    going to [facility]  June 26 to see kidney specialist  . Lightheadedness   . Mass on back   . Nephrolithiasis   . Orthostatic hypotension    a. Improved after discontinuation of metoprolol.  . OSA (obstructive sleep apnea)    a. On CPAP.  [name] Pulmonary embolism ([place]) 01/2006   Post-op/treated  . Skin lesions, generalized    . Stroke [facility]) 1993   right side weakness no blood thinners   on Aspirin  . Syncope and collapse   . Urticaria    Past Surgical History:  Procedure Laterality Date  . BACK SURGERY    . bowel obstruction  01/2009  . CARDIAC CATHETERIZATION  11/2006   patent grafts. Significant OM3 disease and occluded diagonals.  [name] CARDIAC CATHETERIZATION  06/2010   patent grafts. significant ISR in proximal LCX but OM2 is bypassed and gives retrograde flow to OM3.   [name] CARDIAC CATHETERIZATION  06/2013   [facility]: Patent grafts with 60% proximal in-stent restenosis in the left circumflex with FFR of 0.85  . CARDIAC CATHETERIZATION Left 09/25/2015   Procedure: Left Heart Cath and Cors/Grafts Angiography;  Surgeon: Wellington Hampshire, MD;  Location: [place] [facility];  Service: Cardiovascular;  Laterality: Left;  . CERVICAL FUSION    . CHOLECYSTECTOMY    . COLON SURGERY  01/2009   BOWEL RESECTION DUE TO SMALL BOWEL OBSTRUCTION  . COLONOSCOPY    . COLONOSCOPY WITH PROPOFOL N/A 10/28/2017   Procedure: COLONOSCOPY WITH PROPOFOL;  Surgeon: Lollie Sails, MD;  Location: [facility] ENDOSCOPY;  Service: Endoscopy;  Laterality: N/A;  . CORONARY ANGIOPLASTY  2006  . CORONARY ARTERY BYPASS GRAFT  1993/01/2006   redo at [facility]. LIMA to LAD, SVG to OM2 and SVG to RPDA  . CYSTOSCOPY WITH STENT PLACEMENT Bilateral 09/24/2015   Procedure: CYSTOSCOPY WITH BILATERAL RETROGRADES, BILATERAL STENT PLACEMENT;  Surgeon: Festus Aloe, MD;  Location: [facility] ORS;  Service: Urology;  Laterality: Bilateral;  . ESOPHAGOGASTRODUODENOSCOPY N/A 10/25/2014   Procedure: ESOPHAGOGASTRODUODENOSCOPY (EGD);  Surgeon: Lollie Sails, MD;  Location: [facility] ENDOSCOPY;  Service: Endoscopy;  Laterality: N/A;  . ESOPHAGOGASTRODUODENOSCOPY (EGD) WITH PROPOFOL N/A 02/03/2017   Procedure: ESOPHAGOGASTRODUODENOSCOPY (EGD) WITH PROPOFOL;  Surgeon: Lollie Sails, MD;  Location: [facility] ENDOSCOPY;  Service: Endoscopy;  Laterality: N/A;  .  ESOPHAGOGASTRODUODENOSCOPY (EGD) WITH PROPOFOL N/A 04/18/2017   Procedure: ESOPHAGOGASTRODUODENOSCOPY (EGD) WITH PROPOFOL;  Surgeon: Lollie Sails, MD;  Location: [facility] ENDOSCOPY;  Service: Endoscopy;  Laterality: N/A;  . EYE SURGERY Bilateral    Cataract Extraction with IOL  . HERNIA REPAIR    . INGUINAL HERNIA REPAIR Bilateral 01/23/2016   Procedure: HERNIA REPAIR INGUINAL ADULT BILATERAL;  Surgeon: Leonie Green, MD;  Location: [facility] ORS;  Service: General;  Laterality: Bilateral;  . LEFT HEART CATH AND CORS/GRAFTS ANGIOGRAPHY N/A 12/13/2019   Procedure: LEFT HEART CATH AND CORS/GRAFTS ANGIOGRAPHY;  Surgeon: Wellington Hampshire, MD;  Location: [place] [facility];  Service: Cardiovascular;  Laterality: N/A;  . LUMBAR LAMINECTOMY/DECOMPRESSION MICRODISCECTOMY Left 10/01/2016   Procedure: Left Lumbar four-five Laminotomy for resection of synovial cyst;  Surgeon: Erline Levine, MD;  Location: [place];  Service: Neurosurgery;  Laterality: Left;  left  . NECK SURGERY  06/2009  . SHOULDER SURGERY  2010    Allergies  Allergies  Allergen Reactions  . Amitriptyline Other (See Comments)    Pt unsure of what happens  . Cyclobenzaprine Other (See Comments) and Hives    Pt unsure what hapens  . Berniece Salines Flavor Diarrhea  . Fish Oil Itching  . Lidocaine Nausea And Vomiting, Other (See Comments) and Nausea Only    Nasal Spray (only time had reaction)  . Omega-3 Fatty Acids-Vitamin E Itching  . Other Diarrhea and Itching    Reactive agents: Onions, bacon, steak  . Percocet [Oxycodone-Acetaminophen] Other (See Comments)    Lowers BP    History of Present Illness    [name] is a 69 y.o. male with a hx of CAD (CABG 1993, redo CABG 2007, LHC 12/13/19 patent graft, patent stent with moderate ISR, and diffuse small vessel disease stable compared to 2017) mild HFrEF, HLD, HTN, GERD, nephrolithiasis, OSA, hypokalemia, stroke, prior postoperative PE, PFO closure 2007 per patient report. He was last  seen while hospitalized.  Recent admission 12/12/19 with chest pain. Echo 8/22/1  EF 40-45%, mild LVH, gr1DD, mild AI. Cardiac cath via left radial artery performed 12/12/19 with severe underlying three-vessel CAD with patent grafts ad no significant change since 2017 with noted small vessel disease. EF previously 40-45% now 35-40% by cath, amlodipine and indapamide were discontinued and Losartan and Coreg added. His rosuvastatin was increased to 40mg  daily due to LDL of 84.  Of note, CTA 12/11/19 showed "mild extension of hyperdense contrast across interatrial septum suggests a atrial septal defect involving the foramen ovale."   Present today for follow up with his wife. Reviewed hospital testing and course. His wife is very concerned about the report of "hole in his heart". Endorses fatigue and dyspnea with exertion. Feels his fatigue has been more noticeable over the last two months. L radial cath site healing well. Reports no chest pain, pressure, tightness. Endorses  lightheadedness when changing positions. BP at home 111/53 this morning. No near syncope, syncope, palpitations. Endorses eating a low sodium, heart healthy diet. No formal exercise routine.   Of note, despite cardiology recommendations his Indapamide was not discontinued nor Rosuvastatin dose increased.   EKGs/Labs/Other Studies Reviewed:   The following studies were reviewed today: LHC 12-19-19  Ost LAD lesion is 100% stenosed.  Ost 1st Mrg to 1st Mrg lesion is 100% stenosed.  The left ventricular ejection fraction is 35-45% by visual estimate.  There is moderate left ventricular systolic dysfunction.  LV end diastolic pressure is mildly elevated.  Ost RCA to Prox RCA lesion is 100% stenosed.  SVG graft was visualized by angiography and is moderate in size.  The graft exhibits mild .  LIMA graft was visualized by angiography and is normal in caliber.  The graft exhibits no disease.  2nd RPL lesion is 100%  stenosed.  3rd RPL lesion is 100% stenosed.  SVG graft was visualized by angiography and is normal in caliber.  The graft exhibits mild .  2nd Mrg lesion is 100% stenosed.  Prox Cx to Mid Cx lesion is 40% stenosed.   1.  Severe underlying three-vessel coronary artery disease with patent grafts including LIMA to LAD, SVG to OM and SVG to right PDA.  Patent stent in the native mid left circumflex with moderate in-stent restenosis.  No significant change in coronary anatomy since most recent cardiac catheterization in 2017.  The patient does have diffuse small vessel disease. 2.  Moderately reduced LV systolic function with an EF of 35 to 40% with mildly elevated left ventricular end-diastolic pressure at 15 mmHg.   Recommendations: Continue medical therapy for coronary artery disease and chronic systolic heart failure.  The patient is not able to tolerate long-acting nitroglycerin due to headaches. For cardiomyopathy, I discontinued amlodipine and indapamide and will start him on small dose carvedilol and losartan.  We could consider switching to [facility] as an outpatient.   Echo 12/12/19 1. Left ventricular ejection fraction, by estimation, is 40 to 45%. The  left ventricle has mildly decreased function. Left ventricular endocardial  border not optimally defined to evaluate regional wall motion. There is  mild left ventricular hypertrophy.   Left ventricular diastolic parameters are consistent with Grade I  diastolic dysfunction (impaired relaxation).   2. Right ventricular systolic function is normal. The right ventricular  size is normal. There is normal pulmonary artery systolic pressure. The  estimated right ventricular systolic pressure is 95.1 mmHg.   3. Left atrial size was mildly dilated.   4. The mitral valve is normal in structure. No evidence of mitral valve  regurgitation. No evidence of mitral stenosis.   5. The aortic valve is normal in structure. Aortic valve  regurgitation is  mild. Mild aortic valve sclerosis is present, with no evidence of aortic  valve stenosis.   CT 12/12/19 FINDINGS: Cardiovascular: There is adequate opacification of the pulmonary arterial tree. There is no intraluminal filling defect identified to suggest acute pulmonary embolism. The central pulmonary arteries are of normal caliber. Coronary artery bypass grafting has been performed. There is extensive coronary artery calcification as well as evidence of prior stenting involving the native coronary arteries. Cardiac size is at the upper limits of normal. Mild extension of hyperdense contrast across the interatrial septum suggests a atrial septal defect involving the foramen ovale. No pericardial effusion. The thoracic aorta demonstrates mild scattered atherosclerotic calcification and is of normal caliber.   Mediastinum/Nodes: No  pathologic thoracic adenopathy. Thyroid unremarkable. Esophagus unremarkable.   Lungs/Pleura: Lungs are clear. No pneumothorax or pleural effusion. Central airways are widely patent.   Upper Abdomen: No acute abnormality.   Musculoskeletal: No chest wall abnormality. No acute or significant osseous findings.   Review of the MIP images confirms the above findings.   IMPRESSION: 1. No evidence of acute pulmonary embolism. Lungs are clear. 2. 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 3. Aortic atherosclerosis.   Aortic Atherosclerosis (ICD10-I70.0).   EKG:  EKG is  ordered today.  The ekg ordered today demonstrates SR 64 and no acute ST/T wave changes.   Recent Labs: 12/11/2019: ALT 36; Hemoglobin 14.4; Platelets 167 12/12/2019: Magnesium 2.7 12/13/2019: BUN 9; Creatinine, Ser 0.62; Potassium 3.6; Sodium 138  Recent Lipid Panel    Component Value Date/Time   CHOL 181 12/13/2019 0350   TRIG 312 (H) 12/13/2019 0350   HDL 35 (L) 12/13/2019 0350   CHOLHDL 5.2 12/13/2019 0350   VLDL 62 (H)  12/13/2019 0350   LDLCALC 84 12/13/2019 0350    Home Medications   Current Meds  Medication Sig  . albuterol (PROVENTIL HFA;VENTOLIN HFA) 108 (90 Base) MCG/ACT inhaler Inhale into the lungs every 4 (four) hours as needed for wheezing or shortness of breath.  [name] aspirin EC 81 MG tablet Take 81 mg by mouth daily.  . carvedilol (COREG) 3.125 MG tablet Take 1 tablet (3.125 mg total) by mouth 2 (two) times daily with a meal.  . citalopram (CELEXA) 20 MG tablet Take 20 mg by mouth daily.   [name] dicyclomine (BENTYL) 10 MG capsule Take 10 mg by mouth 3 (three) times daily.  [name] docusate sodium (COLACE) 100 MG capsule Take 100 mg by mouth 2 (two) times daily.  Eduard Roux 140 MG/ML SOAJ Inject 140 mg into the skin every 28 (twenty-eight) days.  . fexofenadine (ALLEGRA) 180 MG tablet Take 180 mg by mouth daily.  [name] FIBER PO Take 1 tablet by mouth daily.   . finasteride (PROSCAR) 5 MG tablet Take 5 mg by mouth daily.  . furosemide (LASIX) 40 MG tablet Take 40 mg by mouth daily as needed for fluid.  [name] gabapentin (NEURONTIN) 600 MG tablet Take 600 mg by mouth 3 (three) times daily.  . indapamide (LOZOL) 2.5 MG tablet Take 2.5 mg by mouth daily.  . Methylcellulose, Laxative, 500 MG TABS Take 1 tablet by mouth daily.  . Multiple Vitamins-Minerals (HAIR/SKIN/NAILS PO) Take 1 tablet by mouth 3 (three) times daily.   . nitroGLYCERIN (NITROSTAT) 0.4 MG SL tablet Place 0.4 mg under the tongue every 5 (five) minutes as needed for chest pain.   . pantoprazole (PROTONIX) 40 MG tablet Take 40 mg by mouth 2 (two) times daily.   . potassium chloride SA (K-DUR,KLOR-CON) 20 MEQ tablet Take 20 mEq by mouth daily.  . psyllium (REGULOID) 0.52 g capsule Take 0.52 g by mouth daily.   [name] rOPINIRole (REQUIP) 1 MG tablet Take 1 mg by mouth at bedtime.   . rosuvastatin (CRESTOR) 40 MG tablet Take 1 tablet (40 mg total) by mouth daily.  [name] triamcinolone ointment (KENALOG) 0.1 % Apply 1 application topically 2 (two) times daily.   . vitamin B-12 (CYANOCOBALAMIN) 1000 MCG tablet Take 1,000 mcg by mouth daily.  . [DISCONTINUED] losartan (COZAAR) 25 MG tablet Take 1 tablet (25 mg total) by mouth daily.  . [DISCONTINUED] rosuvastatin (CRESTOR) 20 MG tablet Take 20 mg by mouth daily.  Review of Systems    Review of Systems  Constitutional: Positive for malaise/fatigue. Negative for chills and fever.  Cardiovascular: Positive for dyspnea on exertion. Negative for chest pain, leg swelling, near-syncope, orthopnea, palpitations and syncope.  Respiratory: Negative for cough, shortness of breath and wheezing.   Gastrointestinal: Negative for nausea and vomiting.  Neurological: Negative for dizziness, light-headedness and weakness.   All other systems reviewed and are otherwise negative except as noted above.  Physical Exam    VS:  BP 140/76 (BP Location: Right Arm, Patient Position: Sitting, Cuff Size: Normal)   Pulse 64   Ht 5\' 9"  (1.753 m)   Wt 193 lb (87.5 kg)   BMI 28.50 kg/m  , BMI Body mass index is 28.5 kg/m. GEN: Well nourished, overweight, well developed, in no acute distress. HEENT: normal. Neck: Supple, no JVD, carotid bruits, or masses. Cardiac: RRR, no murmurs, rubs, or gallops. No clubbing, cyanosis, edema.  Radials/DP/PT 2+ and equal bilaterally.  Respiratory:  Respirations regular and unlabored, clear to auscultation bilaterally. GI: Soft, nontender, nondistended, BS + x 4. MS: No deformity or atrophy. Skin: Warm and dry, no rash. L radial cath site clean, dry, intact. No hematoma.  Neuro:  Strength and sensation are intact. Psych: Normal affect.  Assessment & Plan    1. CAD s/p CABG - Cardiac cath 12/13/19 with severe underlying three-vessel CAD with patent grafts and no significant change since 217. Small vessel disease with collaterals. Recommended for medical therapy. GDMT includes aspirin, Coreg, Rosuvastatin.  2. Chronic systolic HF - EF slightly lower 35-40% during recent admission.  He was started on Coreg and Losartan. As BP has tolerated, stop Losartan, start Entresto 24-26mg  BID. BMP today. Indapamide was recommended to be discontinued but not done so at discharge - will readdress based on labs. GDMT includes Entresto, Coreg, PRN Lasix. Consider addition of spironolactone at follow up.   3. HLD - LDL 84 during recent admission with elevated triglyceride.  Increase Rosuvastation to 40mg  daily plan for repeat lipid panel in 6-8 weeks.   4. HTN - BP mildly elevated. Transition to [place], as above. Encouraged to monitor at home. BP goal <130/80.   5. Hx of PFO closure 2007 at [place] 12/11/19 suggestive of "small atrial septal defect involving the foramen ovale. Echo 12/12/19 RV normal size and function, LA mildly dilated, RA normal in size. Will review with primary cardiologist whether additional intervention is warranted.  Disposition: Follow up in 3 week(s) with Dr. Fletcher Anon or APP.   Loel Dubonnet, NP 12/20/2019, 6:53 PM

## 2019-12-21 ENCOUNTER — Telehealth: Payer: Self-pay | Admitting: Family

## 2019-12-21 LAB — BASIC METABOLIC PANEL
BUN/Creatinine Ratio: 18 (ref 10–24)
BUN: 12 mg/dL (ref 8–27)
CO2: 28 mmol/L (ref 20–29)
Calcium: 9.6 mg/dL (ref 8.6–10.2)
Chloride: 95 mmol/L — ABNORMAL LOW (ref 96–106)
Creatinine, Ser: 0.67 mg/dL — ABNORMAL LOW (ref 0.76–1.27)
GFR calc Af Amer: 114 mL/min/{1.73_m2} (ref >59)
GFR calc non Af Amer: 99 mL/min/{1.73_m2} (ref >59)
Glucose: 110 mg/dL — ABNORMAL HIGH (ref 65–99)
Potassium: 3.8 mmol/L (ref 3.5–5.2)
Sodium: 138 mmol/L (ref 134–144)

## 2019-12-21 NOTE — Telephone Encounter (Signed)
Spoke with Dr. Fletcher Anon per patient's wife request. CT during admission with "suggestive of small ASD involving foramen ovale". Echo with no noted peri-atrial flow. RA normal size, LA mildly dilated, RV normal. Hx PFO closure 2007. No indication for further evaluation as echo was unrevealing in regards to PFO. No need for visit with Willie Clark.   Reiterated reassurance to Willie Clark and Willie wife.   Loel Dubonnet, NP

## 2019-12-24 ENCOUNTER — Encounter: Payer: Self-pay | Admitting: Dermatology

## 2019-12-26 ENCOUNTER — Emergency Department
Admission: EM | Admit: 2019-12-26 | Discharge: 2019-12-26 | Disposition: A | Discharge status: Home / Self Care | Payer: Medicare HMO | Attending: Emergency Medicine | Admitting: Emergency Medicine

## 2019-12-26 ENCOUNTER — Other Ambulatory Visit: Payer: Self-pay

## 2019-12-26 DIAGNOSIS — Z86711 Personal history of pulmonary embolism: Secondary | ICD-10-CM | POA: Diagnosis not present

## 2019-12-26 DIAGNOSIS — I2510 Atherosclerotic heart disease of native coronary artery without angina pectoris: Secondary | ICD-10-CM | POA: Insufficient documentation

## 2019-12-26 DIAGNOSIS — Z79899 Other long term (current) drug therapy: Secondary | ICD-10-CM | POA: Insufficient documentation

## 2019-12-26 DIAGNOSIS — Z7982 Long term (current) use of aspirin: Secondary | ICD-10-CM | POA: Diagnosis not present

## 2019-12-26 DIAGNOSIS — T782XXA Anaphylactic shock, unspecified, initial encounter: Secondary | ICD-10-CM

## 2019-12-26 DIAGNOSIS — Z87891 Personal history of nicotine dependence: Secondary | ICD-10-CM | POA: Diagnosis not present

## 2019-12-26 DIAGNOSIS — Y999 Unspecified external cause status: Secondary | ICD-10-CM | POA: Diagnosis not present

## 2019-12-26 DIAGNOSIS — Y929 Unspecified place or not applicable: Secondary | ICD-10-CM | POA: Insufficient documentation

## 2019-12-26 DIAGNOSIS — T7800XA Anaphylactic reaction due to unspecified food, initial encounter: Secondary | ICD-10-CM | POA: Insufficient documentation

## 2019-12-26 DIAGNOSIS — Z955 Presence of coronary angioplasty implant and graft: Secondary | ICD-10-CM | POA: Insufficient documentation

## 2019-12-26 DIAGNOSIS — I5042 Chronic combined systolic (congestive) and diastolic (congestive) heart failure: Secondary | ICD-10-CM | POA: Diagnosis not present

## 2019-12-26 DIAGNOSIS — X58XXXA Exposure to other specified factors, initial encounter: Secondary | ICD-10-CM | POA: Diagnosis not present

## 2019-12-26 DIAGNOSIS — I110 Hypertensive heart disease with heart failure: Secondary | ICD-10-CM | POA: Diagnosis not present

## 2019-12-26 DIAGNOSIS — Y939 Activity, unspecified: Secondary | ICD-10-CM | POA: Diagnosis not present

## 2019-12-26 MED ORDER — METHYLPREDNISOLONE SODIUM SUCC 125 MG IJ SOLR
125.0000 mg | Freq: Once | INTRAMUSCULAR | Status: AC
  Administered 2019-12-26: 125 mg via INTRAMUSCULAR
  Filled 2019-12-26: qty 2

## 2019-12-26 MED ORDER — PREDNISONE 50 MG PO TABS: 50.0000 mg | ORAL_TABLET | Freq: Every day | ORAL | 0 refills | Status: DC

## 2019-12-26 MED ORDER — EPINEPHRINE 0.3 MG/0.3ML IJ SOAJ: 0.3000 mg | Freq: Once | INTRAMUSCULAR | 0 refills | Status: AC

## 2019-12-26 MED ORDER — CETIRIZINE HCL 10 MG PO TABS: 10.0000 mg | ORAL_TABLET | Freq: Every day | ORAL | 0 refills | Status: DC

## 2019-12-26 MED ORDER — DIPHENHYDRAMINE HCL 50 MG/ML IJ SOLN
50.0000 mg | Freq: Once | INTRAMUSCULAR | Status: AC
  Administered 2019-12-26: 50 mg via INTRAMUSCULAR
  Filled 2019-12-26: qty 1

## 2019-12-26 NOTE — ED Triage Notes (Addendum)
Pt states he started a new medication Entresto on Wednesday and noticed itching on Friday and has not been taking the medication since. Pt was sent from [facility] with c/o feeling like his throat is swelling and they gave him benadryl IM, decadron IM and 2 Epi injections PTA. Pt is in NAD at present, no noted rash, airway is patent at this time., states his throat feels a little better since leaving [facility]. States he has not taken the medication since friday

## 2019-12-26 NOTE — ED Notes (Signed)
C/O generalized itching x 10 days.  States recently started taking Entesto.  States throat started to bother him last night.  Patient is AAOx3.  Skin warm and dry. No SOB/ DOE.  Voice clear and strong.  NAD

## 2019-12-26 NOTE — ED Provider Notes (Signed)
[facility] Emergency Department Provider Note  ____________________________________________  Time seen: Approximately 6:09 PM  I have reviewed the triage vital signs and the nursing notes.   HISTORY  Chief Complaint Allergic Reaction    HPI Willie Clark is a 69 y.o. male who presents the emergency department complaining of allergic reaction versus angioedema from your blood pressure medication.  Patient had changed to [facility] from his cardiologist.  Patient taken 3 doses of medicine and started to have a weird sensation in his throat like it was "closing."  Patient has had generalized pruritus as well.  He was seen in urgent care, diagnosed with anaphylaxis versus angioedema, given 2 rounds of epi, Decadron, Benadryl.  This improved patient symptoms.   Patient was sent to the emergency department as he had received epi.  Patient states that this time symptoms have drastically improved.  There is no shortness of breath, stridor, wheezing or pruritus at this time.  Half-life of Entresto should be approximately 11 1/2 hours.  Patient's last dose was 40 hours ago.        Past Medical History:  Diagnosis Date  . Anxiety   . Arthritis   . Barrett's esophagus   . Bowel obstruction ([place]) 02/2009   small bowel  . Chronic chest pain   . Coronary artery disease    a. 1993 CABG; b. 2007 Redo CABG; c. 09/2015 Cath: LM mild dzs, LAD 100ost, RI mild dzs, LCX 60p, OM1 100, RCA 100ost, VG->OM2 min irregs, LIMA->LAD nl, VG->RPDA 30ost, 20d, nl EF->Med rx.  . Depression   . Diverticulitis   . Diverticulosis   . Erectile dysfunction   . Fibromyalgia   . Gastric ulcer   . Gastritis   . GERD (gastroesophageal reflux disease)   . Headache   . Hemorrhoids   . HFrEF (heart failure with reduced ejection fraction) ([place])    a. 09/2016 Echo: EF 55-60%, no rwma, triv AI, mild MR, mildly dil LA. -> as of April 2020: EF 40 to 45%.  Septal dyssynergy/hypokinesis due to postop state  but otherwise unable to assess wall motion [name] due to poor study.  . History of myocardial infarct at age less than 40 years   . Hyperlipidemia   . Hypertension   . Kidney stones    going to [facility]  June 26 to see kidney specialist  . Lightheadedness   . Mass on back   . Nephrolithiasis   . Orthostatic hypotension    a. Improved after discontinuation of metoprolol.  . OSA (obstructive sleep apnea)    a. On CPAP.  [name] Pulmonary embolism ([place]) 01/2006   Post-op/treated  . Skin lesions, generalized   . Stroke [facility]) 1993   right side weakness no blood thinners   on Aspirin  . Syncope and collapse   . Urticaria     Patient Active Problem List   Diagnosis Date Noted  . Unstable angina ([place]) 12/12/2019  . Hypokalemia 12/12/2019  . Depression 12/12/2019  . 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  . Chest pain 07/31/2018  . Synovial cyst of lumbar facet joint 10/01/2016  . Kidney stones   . Ureteral obstruction   . Obstructive uropathy 09/24/2015  . Abnormal stress test   . 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 Chronic combined systolic and diastolic congestive heart failure ([place])   . Essential hypertension   . Preoperative cardiovascular examination   . Bilateral leg edema 01/10/2015  . TIA (transient ischemic  attack) 08/13/2014  . Intermittent claudication ([place]) 06/07/2013  . Abdominal pain 05/16/2012  . Dyspnea 11/21/2011  . Sinus bradycardia 07/12/2010  . ED (erectile dysfunction) 07/12/2010  . Coronary artery disease involving native coronary artery of native heart with angina pectoris ([place])   . Hypertension   . Hyperlipidemia     Past Surgical History:  Procedure Laterality Date  . BACK SURGERY    . bowel obstruction  01/2009  . CARDIAC CATHETERIZATION  11/2006   patent grafts. Significant OM3 disease and occluded diagonals.  [name] CARDIAC CATHETERIZATION  06/2010   patent grafts. significant ISR in proximal LCX but OM2 is bypassed and gives  retrograde flow to OM3.   [name] CARDIAC CATHETERIZATION  06/2013   [facility]: Patent grafts with 60% proximal in-stent restenosis in the left circumflex with FFR of 0.85  . CARDIAC CATHETERIZATION Left 09/25/2015   Procedure: Left Heart Cath and Cors/Grafts Angiography;  Surgeon: Wellington Hampshire, MD;  Location: [place] [facility];  Service: Cardiovascular;  Laterality: Left;  . CERVICAL FUSION    . CHOLECYSTECTOMY    . COLON SURGERY  01/2009   BOWEL RESECTION DUE TO SMALL BOWEL OBSTRUCTION  . COLONOSCOPY    . COLONOSCOPY WITH PROPOFOL N/A 10/28/2017   Procedure: COLONOSCOPY WITH PROPOFOL;  Surgeon: Lollie Sails, MD;  Location: [facility] ENDOSCOPY;  Service: Endoscopy;  Laterality: N/A;  . CORONARY ANGIOPLASTY  2006  . CORONARY ARTERY BYPASS GRAFT  1993/01/2006   redo at [facility]. LIMA to LAD, SVG to OM2 and SVG to RPDA  . CYSTOSCOPY WITH STENT PLACEMENT Bilateral 09/24/2015   Procedure: CYSTOSCOPY WITH BILATERAL RETROGRADES, BILATERAL STENT PLACEMENT;  Surgeon: Festus Aloe, MD;  Location: [facility] ORS;  Service: Urology;  Laterality: Bilateral;  . ESOPHAGOGASTRODUODENOSCOPY N/A 10/25/2014   Procedure: ESOPHAGOGASTRODUODENOSCOPY (EGD);  Surgeon: Lollie Sails, MD;  Location: [facility] ENDOSCOPY;  Service: Endoscopy;  Laterality: N/A;  . ESOPHAGOGASTRODUODENOSCOPY (EGD) WITH PROPOFOL N/A 02/03/2017   Procedure: ESOPHAGOGASTRODUODENOSCOPY (EGD) WITH PROPOFOL;  Surgeon: Lollie Sails, MD;  Location: [facility] ENDOSCOPY;  Service: Endoscopy;  Laterality: N/A;  . ESOPHAGOGASTRODUODENOSCOPY (EGD) WITH PROPOFOL N/A 04/18/2017   Procedure: ESOPHAGOGASTRODUODENOSCOPY (EGD) WITH PROPOFOL;  Surgeon: Lollie Sails, MD;  Location: [facility] ENDOSCOPY;  Service: Endoscopy;  Laterality: N/A;  . EYE SURGERY Bilateral    Cataract Extraction with IOL  . HERNIA REPAIR    . INGUINAL HERNIA REPAIR Bilateral 01/23/2016   Procedure: HERNIA REPAIR INGUINAL ADULT BILATERAL;  Surgeon: Leonie Green, MD;  Location: [facility] ORS;   Service: General;  Laterality: Bilateral;  . LEFT HEART CATH AND CORS/GRAFTS ANGIOGRAPHY N/A 12/13/2019   Procedure: LEFT HEART CATH AND CORS/GRAFTS ANGIOGRAPHY;  Surgeon: Wellington Hampshire, MD;  Location: [place] [facility];  Service: Cardiovascular;  Laterality: N/A;  . LUMBAR LAMINECTOMY/DECOMPRESSION MICRODISCECTOMY Left 10/01/2016   Procedure: Left Lumbar four-five Laminotomy for resection of synovial cyst;  Surgeon: Erline Levine, MD;  Location: [place];  Service: Neurosurgery;  Laterality: Left;  left  . NECK SURGERY  06/2009  . SHOULDER SURGERY  2010    Prior to Admission medications   Medication Sig Start Date End Date Taking? Authorizing Provider  albuterol (PROVENTIL HFA;VENTOLIN HFA) 108 (90 Base) MCG/ACT inhaler Inhale into the lungs every 4 (four) hours as needed for wheezing or shortness of breath.    [provider]  aspirin EC 81 MG tablet Take 81 mg by mouth daily.    [provider]  carvedilol (COREG) 3.125 MG tablet Take 1 tablet (3.125 mg total)  by mouth 2 (two) times daily with a meal. 12/13/19 01/12/20  Para Skeans, MD  cetirizine (ZYRTEC) 10 MG tablet Take 1 tablet (10 mg total) by mouth daily. 12/26/19   Tori Cupps, Charline Bills, PA-C  citalopram (CELEXA) 20 MG tablet Take 20 mg by mouth daily.     [provider]  dicyclomine (BENTYL) 10 MG capsule Take 10 mg by mouth 3 (three) times daily. 11/16/19   [provider]  docusate sodium (COLACE) 100 MG capsule Take 100 mg by mouth 2 (two) times daily.    [provider]  EPINEPHrine 0.3 mg/0.3 mL IJ SOAJ injection Inject 0.3 mLs (0.3 mg total) into the muscle once for 1 dose. 12/26/19 12/26/19  Cerena Baine, Charline Bills, PA-C  Erenumab-aooe 140 MG/ML SOAJ Inject 140 mg into the skin every 28 (twenty-eight) days.    [provider]  fexofenadine (ALLEGRA) 180 MG tablet Take 180 mg by mouth daily.    [provider]  FIBER PO Take 1 tablet by mouth daily.     [provider]  finasteride (PROSCAR) 5 MG tablet Take 5 mg by mouth daily.    [provider]  furosemide (LASIX) 40 MG tablet Take 40 mg by mouth daily as needed for fluid.    [provider]  gabapentin (NEURONTIN) 600 MG tablet Take 600 mg by mouth 3 (three) times daily. 10/07/19   [provider]  indapamide (LOZOL) 2.5 MG tablet Take 2.5 mg by mouth daily. 10/17/17   [provider]  Methylcellulose, Laxative, 500 MG TABS Take 1 tablet by mouth daily.    [provider]  Multiple Vitamins-Minerals (HAIR/SKIN/NAILS PO) Take 1 tablet by mouth 3 (three) times daily.     [provider]  nitroGLYCERIN (NITROSTAT) 0.4 MG SL tablet Place 0.4 mg under the tongue every 5 (five) minutes as needed for chest pain.     [provider]  pantoprazole (PROTONIX) 40 MG tablet Take 40 mg by mouth 2 (two) times daily.     [provider]  potassium chloride SA (K-DUR,KLOR-CON) 20 MEQ tablet Take 20 mEq by mouth daily.    [provider]  predniSONE (DELTASONE) 50 MG tablet Take 1 tablet (50 mg total) by mouth daily with breakfast. 12/26/19   Latrease Kunde, Charline Bills, PA-C  psyllium (REGULOID) 0.52 g capsule Take 0.52 g by mouth daily.     [provider]  rOPINIRole (REQUIP) 1 MG tablet Take 1 mg by mouth at bedtime.  11/19/19   [provider]  rosuvastatin (CRESTOR) 40 MG tablet Take 1 tablet (40 mg total) by mouth daily. 12/20/19   Loel Dubonnet, NP  sacubitril-valsartan (ENTRESTO) 24-26 MG Take 1 tablet by mouth 2 (two) times daily. 12/20/19   Loel Dubonnet, NP  triamcinolone ointment (KENALOG) 0.1 % Apply 1 application topically 2 (two) times daily.    [provider]  vitamin B-12 (CYANOCOBALAMIN) 1000 MCG tablet Take 1,000 mcg by mouth daily.    [provider]    Allergies Amitriptyline, Cyclobenzaprine, Entresto [sacubitril-valsartan], [employer], Fish oil, Lidocaine, Omega-3 fatty  acids-vitamin e, Other, and Percocet [oxycodone-acetaminophen]  Family History  Problem Relation Age of Onset  . Heart disease Father 99  . Heart attack Father     Social History Social History   Tobacco Use  . Smoking status: Former Smoker    Packs/day: 2.50    Types: Cigarettes    Quit date: 08/21/1991    Years since quitting:  28.3  . Smokeless tobacco: Never Used  Vaping Use  . Vaping Use: Never used  Substance Use Topics  . Alcohol use: Not Currently  . Drug use: No     Review of Systems  Constitutional: No fever/chills Eyes: No visual changes. No discharge ENT: Sensation of throat closing improved after medications Cardiovascular: no chest pain. Respiratory: no cough. No SOB. Gastrointestinal: No abdominal pain.  No nausea, no vomiting.  No diarrhea.  No constipation. Musculoskeletal: Negative for musculoskeletal pain. Skin: Negative for rash, abrasions, lacerations, ecchymosis.  Generalized pruritus improved after medications Neurological: Negative for headaches, focal weakness or numbness. 10-point ROS otherwise negative.  ____________________________________________   PHYSICAL EXAM:  VITAL SIGNS: ED Triage Vitals  Enc Vitals Group     BP 12/26/19 1457 134/83     Pulse Rate 12/26/19 1457 68     Resp 12/26/19 1457 17     Temp 12/26/19 1501 98.4 F (36.9 C)     Temp Source 12/26/19 1457 Oral     SpO2 12/26/19 1457 97 %     Weight 12/26/19 1458 187 lb 6.4 oz (85 kg)     Height 12/26/19 1458 5\' 9"  (1.753 m)     Head Circumference --      Peak Flow --      Pain Score 12/26/19 1457 2     Pain Loc --      Pain Edu? --      Excl. in [place]? --      Constitutional: Alert and oriented. Well appearing and in no acute distress. Eyes: Conjunctivae are normal. PERRL. EOMI. Head: Atraumatic. ENT:      Ears:       Nose: No congestion/rhinnorhea.      Mouth/Throat: Mucous membranes are moist.  No angioedema, oropharyngeal edema identified at this time.  Uvula is  midline.  No stridor. Neck: No stridor.    Cardiovascular: Normal rate, regular rhythm. Normal S1 and S2.  Good peripheral circulation. Respiratory: Normal respiratory effort without tachypnea or retractions. Lungs CTAB. Good air entry to the bases with no decreased or absent breath sounds. Gastrointestinal: Bowel sounds 4 quadrants. Soft and nontender to palpation. No guarding or rigidity. No palpable masses. No distention. No CVA tenderness. Musculoskeletal: Full range of motion to all extremities. No gross deformities appreciated. Neurologic:  Normal speech and language. No gross focal neurologic deficits are appreciated.  Skin:  Skin is warm, dry and intact. No rash noted. Psychiatric: Mood and affect are normal. Speech and behavior are normal. Patient exhibits appropriate insight and judgement.   ____________________________________________   LABS (all labs ordered are listed, but only abnormal results are displayed)  Labs Reviewed - No data to display ____________________________________________  EKG   ____________________________________________  RADIOLOGY   No results found.  ____________________________________________    PROCEDURES  Procedure(s) performed:    Procedures    Medications  methylPREDNISolone sodium succinate (SOLU-MEDROL) 125 mg/2 mL injection 125 mg (has no administration in time range)  diphenhydrAMINE (BENADRYL) injection 50 mg (has no administration in time range)     ____________________________________________   INITIAL IMPRESSION / ASSESSMENT AND PLAN / ED COURSE  Pertinent labs & imaging results that were available during my care of the patient were reviewed by me and considered in my medical decision making (see chart for details).  Review of the [place] CSRS was performed in accordance of the [place] prior to dispensing any controlled drugs.           Patient's diagnosis is  consistent with anaphylaxis/angioedema from [facility].   Patient started new blood pressure medication, had feelings of throat closing, swelling of the tongue, shortness of breath, generalized pruritus.  Patient was seen in urgent care, given 2 rounds of epinephrine, Decadron and Benadryl.  Patient reports symptoms improved.  He was sent to the emergency department as he had received epi for monitoring.  Patient has had no complications or problems at this time.  Exam was reassuring with no angioedema.  Patient is given another round of steroid and Benadryl here in the emergency department and will be discharged with prescription for course of steroid, Zyrtec, EpiPen.  Return precautions are discussed at length with the patient and his wife.  Patient is stable at this time for discharge.  Patient has been monitored for 7 hours after administration of EpiPen..Patient is given ED precautions to return to the ED for any worsening or new symptoms.     ____________________________________________  FINAL CLINICAL IMPRESSION(S) / ED DIAGNOSES  Final diagnoses:  Anaphylaxis, initial encounter      NEW MEDICATIONS STARTED DURING THIS VISIT:  ED Discharge Orders         Ordered    predniSONE (DELTASONE) 50 MG tablet  Daily with breakfast        12/26/19 1852    cetirizine (ZYRTEC) 10 MG tablet  Daily        12/26/19 1852    EPINEPHrine 0.3 mg/0.3 mL IJ SOAJ injection   Once        12/26/19 1852              This chart was dictated using voice recognition software/Dragon. Despite best efforts to proofread, errors can occur which can change the meaning. Any change was purely unintentional.    Darletta Moll, PA-C 12/26/19 1928    Vladimir Crofts, MD 12/26/19 [phone]

## 2019-12-28 ENCOUNTER — Telehealth: Payer: Self-pay | Admitting: Family

## 2019-12-28 NOTE — Telephone Encounter (Signed)
Pt c/o medication issue:  1. Name of Medication: Entresto  2. How are you currently taking this medication (dosage and times per day)? 24-26 bid  3. Are you having a reaction (difficulty breathing--STAT)? Itching, throat closed up, went to urgent and ended up in ED  4. What is your medication issue? Allergic reaction[facility], 2 in the ED to help. Patient stopped taking after Friday morning. Patient curious if he should restart the losartan  Please advise

## 2019-12-28 NOTE — Telephone Encounter (Signed)
Spoke with patient who reports symptoms of , "itchy throat, throat closing up etc.)  Pt was seen at [facility], treated with Epinephrine, Benadryl and decadron and sent to the ED where he received additional treatment.  Pt states he is feeling much better and is not taking Entresto. Pt made aware writer will notify Dr Fletcher Anon and his nurse today for further instructions on how to proceed.

## 2019-12-31 NOTE — Telephone Encounter (Signed)
-----   Message from Wellington Hampshire, MD sent at 12/31/2019 12:06 PM EDT ----- Regarding: RE: Medication reaction Stop Entresto and add to his allergy list.  We will evaluate an alternative medication when he comes for follow-up.  ----- Message ----- From: Chapman Moss, RN Sent: 12/28/2019  11:45 AM EDT To: Wellington Hampshire, MD, Lamar Laundry, RN Subject: Medication reaction                            Good afternoon,  Patient called to report he had been to Urgent Care for "itching in throat, throat closing up."  Pt states he was seen at [facility] and sent to the ED for his symptoms he states started a few days after starting Entresto on 12/20/2019.  Symptom onset Friday 12/24/2019.    Pt stopped Entresto this weekend, symptoms resolved after treatment for anaphylactic reaction.   Pt called to find out what to do going forward.

## 2019-12-31 NOTE — Telephone Encounter (Signed)
Patient made aware of Dr. Tyrell Antonio response and recommendation. Entesto was previously added to the patients allergy and list as High severity. Patient verbalized understanding and voiced appreciation for the call.

## 2020-01-10 ENCOUNTER — Encounter: Payer: Self-pay | Admitting: Family

## 2020-01-10 ENCOUNTER — Other Ambulatory Visit: Payer: Self-pay

## 2020-01-10 ENCOUNTER — Ambulatory Visit: Payer: Medicare HMO | Admitting: Family

## 2020-01-10 VITALS — BP 128/78 | HR 58 | Ht 69.0 in | Wt 198.0 lb

## 2020-01-10 DIAGNOSIS — Z79899 Other long term (current) drug therapy: Secondary | ICD-10-CM | POA: Diagnosis not present

## 2020-01-10 DIAGNOSIS — I10 Essential (primary) hypertension: Secondary | ICD-10-CM | POA: Diagnosis not present

## 2020-01-10 DIAGNOSIS — I5042 Chronic combined systolic (congestive) and diastolic (congestive) heart failure: Secondary | ICD-10-CM | POA: Diagnosis not present

## 2020-01-10 DIAGNOSIS — I25118 Atherosclerotic heart disease of native coronary artery with other forms of angina pectoris: Secondary | ICD-10-CM | POA: Diagnosis not present

## 2020-01-10 DIAGNOSIS — E785 Hyperlipidemia, unspecified: Secondary | ICD-10-CM

## 2020-01-10 MED ORDER — SPIRONOLACTONE 25 MG PO TABS: 12.5000 mg | ORAL_TABLET | Freq: Every day | ORAL | 2 refills | Status: DC

## 2020-01-10 MED ORDER — FUROSEMIDE 40 MG PO TABS: 40.0000 mg | ORAL_TABLET | Freq: Every day | ORAL | 1 refills | Status: DC | PRN

## 2020-01-10 MED ORDER — LOSARTAN POTASSIUM 25 MG PO TABS: 25.0000 mg | ORAL_TABLET | Freq: Every day | ORAL | 1 refills | Status: DC

## 2020-01-10 NOTE — Patient Instructions (Addendum)
Medication Instructions:  Your physician has recommended you make the following change in your medication:   START Spironolactone 12.5mg  (half tablet) once daily  CONTINUE Furosemide as-needed for weight gain or swelling  CONTINUE Losartan   You may stop the prednisone and cetirizine (zyrtec).   *If you need a refill on your cardiac medications before your next appointment, please call your pharmacy*  Lab Work: Your provider recommends that you have lab work today: [facility]  Your provider recommends that you return for lab work in: 1 week at the [facility] for BMP. You do not need to be fasting for this lab work.   If you have labs (blood work) drawn today and your tests are completely normal, you will receive your results only by: [name] MyChart Message (if you have MyChart) OR . A paper copy in the mail If you have any lab test that is abnormal or we need to change your treatment, we will call you to review the results.  Testing/Procedures: None ordered today.   Follow-Up: At [facility], you and your health needs are our priority.  As part of our continuing mission to provide you with exceptional heart care, we have created designated Provider Care Teams.  These Care Teams include your primary Cardiologist (physician) and Advanced Practice Providers (APPs -  Physician Assistants and Nurse Practitioners) who all work together to provide you with the care you need, when you need it.  We recommend signing up for the patient portal called "MyChart".  Sign up information is provided on this After Visit Summary.  MyChart is used to connect with patients for Virtual Visits (Telemedicine).  Patients are able to view lab/test results, encounter notes, upcoming appointments, etc.  Non-urgent messages can be sent to your provider as well.   To learn more about what you can do with MyChart, go to [email or web address].    Your next appointment:   3 week(s)  The format for your next  appointment:   In Person  Provider:   You may see [name], MD or one of the following Advanced Practice Providers on your designated Care Team:   Murray Hodgkins, NP  Christell Faith, PA-C  Marrianne Mood, PA-C  Laurann [place], NP  Cadence Kathlen Mody, [place]  Other Instructions  Weight your self daily and keep a log. If you notice weight gain of 2 lbs overnight or 5 lbs in one week, recommend you take your Lasix and let us know.   Keep your legs elevated when sitting.   Continue your low sodium (salt), heart healthy diet.   Spironolactone Oral Tablets What is this medicine? SPIRONOLACTONE (speer on [place] LAK tone) is a diuretic. It helps you make more urine and to lose excess water from your body. This medicine is used to treat high blood pressure, and edema or swelling from heart, kidney, or liver disease. It is also used to treat patients who make too much aldosterone or have low potassium. This medicine may be used for other purposes; ask your health care provider or pharmacist if you have questions. COMMON BRAND NAME(S): Aldactone What should I tell my health care provider before I take this medicine? They need to know if you have any of these conditions:  high blood level of potassium  kidney disease or trouble making urine  liver disease  an unusual or allergic reaction to spironolactone, other medicines, foods, dyes, or preservatives  pregnant or trying to get pregnant  breast-feeding How should I use this medicine? Take  this drug by mouth. Take it as directed on the prescription label at the same time every day. You can take it with or without food. You should always take it the same way. Keep taking it unless your health care provider tells you to stop. Talk to your health care provider about the use of this drug in children. Special care may be needed. Overdosage: If you think you have taken too much of this medicine contact a poison control center or emergency room  at once. NOTE: This medicine is only for you. Do not share this medicine with others. What if I miss a dose? If you miss a dose, take it as soon as you can. If it is almost time for your next dose, take only that dose. Do not take double or extra doses. What may interact with this medicine? What should I watch for while using this medicine? Visit your doctor or health care professional for regular checks on your progress. Check your blood pressure as directed. Ask your doctor what your blood pressure should be, and when you should contact them. You may need to be on a special diet while taking this medicine. Ask your doctor. Also, ask how many glasses of fluid you need to drink a day. You must not get dehydrated. This medicine may make you feel confused, dizzy or lightheaded. Drinking alcohol and taking some medicines can make this worse. Do not drive, use machinery, or do anything that needs mental alertness until you know how this medicine affects you. Do not sit or stand up quickly. What side effects may I notice from receiving this medicine? Side effects that you should report to your doctor or health care professional as soon as possible:  allergic reactions such as skin rash or itching, hives, swelling of the lips, mouth, tongue, or throat  black or tarry stools  fast, irregular heartbeat  fever  muscle pain, cramps  numbness, tingling in hands or feet  trouble breathing  trouble passing urine  unusual bleeding  unusually weak or tired Side effects that usually do not require medical attention (report to your doctor or health care professional if they continue or are bothersome):  change in voice or hair growth  confusion  dizzy, drowsy  dry mouth, increased thirst  enlarged or tender breasts  headache  irregular menstrual periods  sexual difficulty, unable to have an erection  stomach upset This list may not describe all possible side effects. Call your doctor  for medical advice about side effects. You may report side effects to FDA at 1-800-FDA-1088. Where should I keep my medicine? Keep out of the reach of children and pets. Store at room temperature between 20 and 25 degrees C (68 and 77 degrees F). Throw away any unused drug after the expiration date. NOTE: This sheet is a summary. It may not cover all possible information. If you have questions about this medicine, talk to your doctor, pharmacist, or health care provider.  2020 Elsevier/Gold Standard (2018-12-01 12:38:34)

## 2020-01-10 NOTE — Progress Notes (Signed)
Office Visit    Patient Name: Willie Clark Date of Encounter: 01/10/2020  Primary Care Provider:  [name], MD Primary Cardiologist:  Willie Sacramento, MD Electrophysiologist:  None   Chief Complaint    Willie Clark is a 69 y.o. male with a hx of CAD (CABG 1993, redo CABG 2007, LHC 12/13/19 patent graft, patent stent with moderate ISR, and diffuse small vessel disease stable compared to 2017) HFrEF, HLD, hTN, GERD, nephrolithiasis, OSA, stroke, prior postoperative PE, PFO closure 2007 per patient report presents today for follow up of HF.    Past Medical History    Past Medical History:  Diagnosis Date  . Anxiety   . Arthritis   . Barrett's esophagus   . Bowel obstruction ([place]) 02/2009   small bowel  . Chronic chest pain   . Coronary artery disease    a. 1993 CABG; b. 2007 Redo CABG; c. 09/2015 Cath: LM mild dzs, LAD 100ost, RI mild dzs, LCX 60p, OM1 100, RCA 100ost, VG->OM2 min irregs, LIMA->LAD nl, VG->RPDA 30ost, 20d, nl EF->Med rx.  . Depression   . Diverticulitis   . Diverticulosis   . Erectile dysfunction   . Fibromyalgia   . Gastric ulcer   . Gastritis   . GERD (gastroesophageal reflux disease)   . Headache   . Hemorrhoids   . HFrEF (heart failure with reduced ejection fraction) ([place])    a. 09/2016 Echo: EF 55-60%, no rwma, triv AI, mild MR, mildly dil LA. -> as of April 2020: EF 40 to 45%.  Septal dyssynergy/hypokinesis due to postop state but otherwise unable to assess wall motion Willie Clark due to poor study.  . History of myocardial infarct at age less than 22 years   . Hyperlipidemia   . Hypertension   . Kidney stones    going to [facility]  June 26 to see kidney specialist  . Lightheadedness   . Mass on back   . Nephrolithiasis   . Orthostatic hypotension    a. Improved after discontinuation of metoprolol.  . OSA (obstructive sleep apnea)    a. On CPAP.  [name] Pulmonary embolism ([place]) 01/2006   Post-op/treated  . Skin lesions, generalized   .  Stroke [facility]) 1993   right side weakness no blood thinners   on Aspirin  . Syncope and collapse   . Urticaria    Past Surgical History:  Procedure Laterality Date  . BACK SURGERY    . bowel obstruction  01/2009  . CARDIAC CATHETERIZATION  11/2006   patent grafts. Significant OM3 disease and occluded diagonals.  [name] CARDIAC CATHETERIZATION  06/2010   patent grafts. significant ISR in proximal LCX but OM2 is bypassed and gives retrograde flow to OM3.   [name] CARDIAC CATHETERIZATION  06/2013   [facility]: Patent grafts with 60% proximal in-stent restenosis in the left circumflex with FFR of 0.85  . CARDIAC CATHETERIZATION Left 09/25/2015   Procedure: Left Heart Cath and Cors/Grafts Angiography;  Surgeon: Willie Hampshire, MD;  Location: [place] CV [facility];  Service: Cardiovascular;  Laterality: Left;  . CERVICAL FUSION    . CHOLECYSTECTOMY    . COLON SURGERY  01/2009   BOWEL RESECTION DUE TO SMALL BOWEL OBSTRUCTION  . COLONOSCOPY    . COLONOSCOPY WITH PROPOFOL N/A 10/28/2017   Procedure: COLONOSCOPY WITH PROPOFOL;  Surgeon: Willie Sails, MD;  Location: [facility] ENDOSCOPY;  Service: Endoscopy;  Laterality: N/A;  . CORONARY ANGIOPLASTY  2006  . CORONARY ARTERY BYPASS GRAFT  1993/01/2006   redo at [facility]. LIMA to LAD, SVG to OM2 and SVG to RPDA  . CYSTOSCOPY WITH STENT PLACEMENT Bilateral 09/24/2015   Procedure: CYSTOSCOPY WITH BILATERAL RETROGRADES, BILATERAL STENT PLACEMENT;  Surgeon: Willie Aloe, MD;  Location: [facility] ORS;  Service: Urology;  Laterality: Bilateral;  . ESOPHAGOGASTRODUODENOSCOPY N/A 10/25/2014   Procedure: ESOPHAGOGASTRODUODENOSCOPY (EGD);  Surgeon: Willie Sails, MD;  Location: [facility] ENDOSCOPY;  Service: Endoscopy;  Laterality: N/A;  . ESOPHAGOGASTRODUODENOSCOPY (EGD) WITH PROPOFOL N/A 02/03/2017   Procedure: ESOPHAGOGASTRODUODENOSCOPY (EGD) WITH PROPOFOL;  Surgeon: Willie Sails, MD;  Location: [facility] ENDOSCOPY;  Service: Endoscopy;  Laterality: N/A;  . ESOPHAGOGASTRODUODENOSCOPY  (EGD) WITH PROPOFOL N/A 04/18/2017   Procedure: ESOPHAGOGASTRODUODENOSCOPY (EGD) WITH PROPOFOL;  Surgeon: Willie Sails, MD;  Location: [facility] ENDOSCOPY;  Service: Endoscopy;  Laterality: N/A;  . EYE SURGERY Bilateral    Cataract Extraction with IOL  . HERNIA REPAIR    . INGUINAL HERNIA REPAIR Bilateral 01/23/2016   Procedure: HERNIA REPAIR INGUINAL ADULT BILATERAL;  Surgeon: Willie Green, MD;  Location: [facility] ORS;  Service: General;  Laterality: Bilateral;  . LEFT HEART CATH AND CORS/GRAFTS ANGIOGRAPHY N/A 12/13/2019   Procedure: LEFT HEART CATH AND CORS/GRAFTS ANGIOGRAPHY;  Surgeon: Willie Hampshire, MD;  Location: [place] [facility];  Service: Cardiovascular;  Laterality: N/A;  . LUMBAR LAMINECTOMY/DECOMPRESSION MICRODISCECTOMY Left 10/01/2016   Procedure: Left Lumbar four-five Laminotomy for resection of synovial cyst;  Surgeon: Willie Levine, MD;  Location: [place];  Service: Neurosurgery;  Laterality: Left;  left  . NECK SURGERY  06/2009  . SHOULDER SURGERY  2010    Allergies  Allergies  Allergen Reactions  . Amitriptyline Other (See Comments)    Pt unsure of what happens  . Cyclobenzaprine Other (See Comments) and Hives    Pt unsure what hapens  . Entresto [Sacubitril-Valsartan] Anaphylaxis  . Berniece Salines Flavor Diarrhea  . Fish Oil Itching  . Lidocaine Nausea And Vomiting, Other (See Comments) and Nausea Only    Nasal Spray (only time had reaction)  . Omega-3 Fatty Acids-Vitamin E Itching  . Other Diarrhea and Itching    Reactive agents: Onions, bacon, steak  . Percocet [Oxycodone-Acetaminophen] Other (See Comments)    Lowers BP    History of Present Illness    Willie Clark is a 69 y.o. male with a hx of CAD (CABG 1993, redo CABG 2007, LHC 12/13/19 patent graft, patent stent with moderate ISR, and diffuse small vessel disease stable compared to 2017) HFrEF, HLD, HTN, GERD, nephrolithiasis, OSA, hypokalemia, stroke, prior postoperative PE, PFO closure 2007 per patient  report. He was last seen while hospitalized.  Recent admission 12/12/19 with chest pain. Echo 8/22/1  EF 40-45%, mild LVH, gr1DD, mild AI. Cardiac cath via left radial artery performed 12/12/19 with severe underlying three-vessel CAD with patent grafts ad no significant change since 2017 with noted small vessel disease. EF previously 40-45% now 35-40% by cath, amlodipine and indapamide were discontinued and Losartan and Coreg added. His rosuvastatin was increased to 40mg  daily due to LDL of 84.   Of note, CTA 12/11/19 showed "mild extension of hyperdense contrast across interatrial septum suggests a atrial septal defect involving the foramen ovale."   Seen in follow up 30/21 with catheterization site healing well.  Despite recommendations by cardiology at discharge his rosuvastatin was not increased nor indapamide discontinued.  At clinic visit his losartan was transitioned to [facility] and rosuvastatin increased to 40 mg daily.  However he had subsequent allergic reaction to Entresto (rash,  itching) treated with Decadron, Benadryl, Epinephrine and this was discontinued.  Presents today with his wife for follow-up.  Notes weight 5 pounds up from clinic visit 3 weeks ago.  Tells me he took his furosemide 40 mg yesterday.  He takes approximately once per week.  Reports no chest pain, pressure, tightness.  Reports no shortness of breath or dyspnea on exertion.  Endorses lower extremity edema as well as abdominal fullness.  Since stopping the [facility] he has resumed losartan without difficulty.  EKGs/Labs/Other Studies Reviewed:   The following studies were reviewed today: LHC 01/07/2020  Ost LAD lesion is 100% stenosed.  Ost 1st Mrg to 1st Mrg lesion is 100% stenosed.  The left ventricular ejection fraction is 35-45% by visual estimate.  There is moderate left ventricular systolic dysfunction.  LV end diastolic pressure is mildly elevated.  Ost RCA to Prox RCA lesion is 100% stenosed.  SVG graft was  visualized by angiography and is moderate in size.  The graft exhibits mild .  LIMA graft was visualized by angiography and is normal in caliber.  The graft exhibits no disease.  2nd RPL lesion is 100% stenosed.  3rd RPL lesion is 100% stenosed.  SVG graft was visualized by angiography and is normal in caliber.  The graft exhibits mild .  2nd Mrg lesion is 100% stenosed.  Prox Cx to Mid Cx lesion is 40% stenosed.   1.  Severe underlying three-vessel coronary artery disease with patent grafts including LIMA to LAD, SVG to OM and SVG to right PDA.  Patent stent in the native mid left circumflex with moderate in-stent restenosis.  No significant change in coronary anatomy since most recent cardiac catheterization in 2017.  The patient does have diffuse small vessel disease. 2.  Moderately reduced LV systolic function with an EF of 35 to 40% with mildly elevated left ventricular end-diastolic pressure at 15 mmHg.   Recommendations: Continue medical therapy for coronary artery disease and chronic systolic heart failure.  The patient is not able to tolerate long-acting nitroglycerin due to headaches. For cardiomyopathy, I discontinued amlodipine and indapamide and will start him on small dose carvedilol and losartan.  We could consider switching to [facility] as an outpatient.   Echo 12/12/19 1. Left ventricular ejection fraction, by estimation, is 40 to 45%. The  left ventricle has mildly decreased function. Left ventricular endocardial  border not optimally defined to evaluate regional wall motion. There is  mild left ventricular hypertrophy.   Left ventricular diastolic parameters are consistent with Grade I  diastolic dysfunction (impaired relaxation).   2. Right ventricular systolic function is normal. The right ventricular  size is normal. There is normal pulmonary artery systolic pressure. The  estimated right ventricular systolic pressure is 99.2 mmHg.   3. Left atrial size was  mildly dilated.   4. The mitral valve is normal in structure. No evidence of mitral valve  regurgitation. No evidence of mitral stenosis.   5. The aortic valve is normal in structure. Aortic valve regurgitation is  mild. Mild aortic valve sclerosis is present, with no evidence of aortic  valve stenosis.   CT 12/12/19 FINDINGS: Cardiovascular: There is adequate opacification of the pulmonary arterial tree. There is no intraluminal filling defect identified to suggest acute pulmonary embolism. The central pulmonary arteries are of normal caliber. Coronary artery bypass grafting has been performed. There is extensive coronary artery calcification as well as evidence of prior stenting involving the native coronary arteries. Cardiac size is at the upper  limits of normal. Mild extension of hyperdense contrast across the interatrial septum suggests a atrial septal defect involving the foramen ovale. No pericardial effusion. The thoracic aorta demonstrates mild scattered atherosclerotic calcification and is of normal caliber.   Mediastinum/Nodes: No pathologic thoracic adenopathy. Thyroid unremarkable. Esophagus unremarkable.   Lungs/Pleura: Lungs are clear. No pneumothorax or pleural effusion. Central airways are widely patent.   Upper Abdomen: No acute abnormality.   Musculoskeletal: No chest wall abnormality. No acute or significant osseous findings.   Review of the MIP images confirms the above findings.   IMPRESSION: 1. No evidence of acute pulmonary embolism. Lungs are clear. 2. 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 3. Aortic atherosclerosis.   Aortic Atherosclerosis (ICD10-I70.0).   EKG: No EKG today.  EKG independently reviewed from 12/20/2019 shows SR 64 and no acute ST/T wave changes.   Recent Labs: 12/11/2019: ALT 36; Hemoglobin 14.4; Platelets 167 12/12/2019: Magnesium 2.7 12/20/2019: BUN 12; Creatinine, Ser 0.67; Potassium  3.8; Sodium 138  Recent Lipid Panel    Component Value Date/Time   CHOL 181 12/13/2019 0350   TRIG 312 (H) 12/13/2019 0350   HDL 35 (L) 12/13/2019 0350   CHOLHDL 5.2 12/13/2019 0350   VLDL 62 (H) 12/13/2019 0350   LDLCALC 84 12/13/2019 0350    Home Medications   Current Meds  Medication Sig  . enalapril (VASOTEC) 20 MG tablet Take by mouth.  . gabapentin (NEURONTIN) 800 MG tablet Take 1 tablet by mouth 3 (three) times daily.  [name] losartan (COZAAR) 25 MG tablet Take by mouth.  . metoprolol succinate (TOPROL-XL) 25 MG 24 hr tablet Take by mouth.  . potassium chloride (KLOR-CON) 20 MEQ packet Take by mouth.  . rosuvastatin (CRESTOR) 20 MG tablet Take by mouth.      Review of Systems  Review of Systems  Constitutional: Negative for chills, fever and malaise/fatigue.  Cardiovascular: Positive for leg swelling. Negative for chest pain, dyspnea on exertion, irregular heartbeat, near-syncope, orthopnea, palpitations and syncope.  Respiratory: Negative for cough, shortness of breath and wheezing.   Gastrointestinal: Negative for melena, nausea and vomiting.  Genitourinary: Negative for hematuria.  Neurological: Negative for dizziness, light-headedness and weakness.   All other systems reviewed and are otherwise negative except as noted above.  Physical Exam    VS:  There were no vitals taken for this visit. , BMI There is no height or weight on file to calculate BMI. GEN: Well nourished, overweight, well developed, in no acute distress. HEENT: normal. Neck: Supple, no JVD, carotid bruits, or masses. Cardiac: RRR, no murmurs, rubs, or gallops. No clubbing, cyanosis,.  Lower extremities bilateral 2+ pitting edema..  Radials/DP/PT 2+ and equal bilaterally.  Respiratory:  Respirations regular and unlabored, clear to auscultation bilaterally. GI: Soft, nontender, nondistended, BS + x 4. MS: No deformity or atrophy. Skin: Warm and dry, no rash Neuro:  Strength and sensation are  intact. Psych: Normal affect.  Assessment & Plan   1. CAD s/p CABG - Cardiac cath 12/13/19 with severe underlying three-vessel CAD with patent grafts and no significant change since 2017. Small vessel disease with collaterals. Recommended for medical therapy. GDMT includes aspirin, Coreg, Rosuvastatin.  No anginal symptoms.  No negation for ischemic evaluation this time.  2. Chronic systolic HF - EF slightly lower 35-40% during recent admission. He was started on Coreg and Losartan.  He was trialed and Entresto though had anaphylactic reaction and this was subsequently discontinued.  Tolerates losartan without side effect.  Taking Lasix 40 mg as needed, refill provided.  Start spironolactone 12.5 mg daily.  BMP today and BMP in 1 week.  Weight up 5 pounds from clinic visit 3 weeks ago.  He will take Lasix 40 mg today and for weight gain of 2 pounds overnight or 5 pounds in 1 week.  3. HLD - LDL 84 during recent admission with elevated triglyceride.  12/20/2019 his rosuvastatin was increased to 40 mg daily.  Plan for repeat lipid panel at follow-up.    4. HTN - BP well controlled. Continue current antihypertensive regimen.   5. Hx of PFO closure 2007 at [place] 12/11/19 suggestive of "small atrial septal defect involving the foramen ovale. Echo 12/12/19 RV normal size and function, LA mildly dilated, RA normal in size.  Discussed with primary cardiologist.  Not of concern.  Disposition: Follow up in 3 week(s) with Dr. Fletcher Anon or APP.   Loel Dubonnet, NP 01/10/2020, 8:11 AM

## 2020-01-11 LAB — BASIC METABOLIC PANEL
BUN/Creatinine Ratio: 19 (ref 10–24)
BUN: 12 mg/dL (ref 8–27)
CO2: 25 mmol/L (ref 20–29)
Calcium: 9.1 mg/dL (ref 8.6–10.2)
Chloride: 97 mmol/L (ref 96–106)
Creatinine, Ser: 0.64 mg/dL — ABNORMAL LOW (ref 0.76–1.27)
GFR calc Af Amer: 116 mL/min/{1.73_m2} (ref >59)
GFR calc non Af Amer: 101 mL/min/{1.73_m2} (ref >59)
Glucose: 94 mg/dL (ref 65–99)
Potassium: 3.7 mmol/L (ref 3.5–5.2)
Sodium: 140 mmol/L (ref 134–144)

## 2020-01-17 ENCOUNTER — Other Ambulatory Visit
Admission: RE | Admit: 2020-01-17 | Discharge: 2020-01-17 | Disposition: A | Discharge status: Home / Self Care | Payer: Medicare HMO | Attending: Family | Admitting: Family

## 2020-01-17 ENCOUNTER — Other Ambulatory Visit: Payer: Self-pay

## 2020-01-17 DIAGNOSIS — I10 Essential (primary) hypertension: Secondary | ICD-10-CM

## 2020-01-17 DIAGNOSIS — I25118 Atherosclerotic heart disease of native coronary artery with other forms of angina pectoris: Secondary | ICD-10-CM | POA: Insufficient documentation

## 2020-01-17 DIAGNOSIS — Z79899 Other long term (current) drug therapy: Secondary | ICD-10-CM | POA: Diagnosis present

## 2020-01-17 DIAGNOSIS — I5042 Chronic combined systolic (congestive) and diastolic (congestive) heart failure: Secondary | ICD-10-CM | POA: Diagnosis present

## 2020-01-17 LAB — BASIC METABOLIC PANEL
Anion gap: 9 (ref 5–15)
BUN: 11 mg/dL (ref 8–23)
CO2: 31 mmol/L (ref 22–32)
Calcium: 9.3 mg/dL (ref 8.9–10.3)
Chloride: 98 mmol/L (ref 98–111)
Creatinine, Ser: 0.87 mg/dL (ref 0.61–1.24)
GFR calc Af Amer: >60 mL/min (ref >60)
GFR calc non Af Amer: >60 mL/min (ref >60)
Glucose, Bld: 117 mg/dL — ABNORMAL HIGH (ref 70–99)
Potassium: 3.7 mmol/L (ref 3.5–5.1)
Sodium: 138 mmol/L (ref 135–145)

## 2020-01-30 NOTE — Progress Notes (Signed)
Office Visit    Patient Name: Willie Clark Date of Encounter: 01/30/2020  Primary Care Provider:  [name], MD Primary Cardiologist:  [name], MD Electrophysiologist:  None   Chief Complaint    Willie Clark is a 69 y.o. male with a hx of CAD (CABG 1993, redo CABG 2007, LHC 12/13/19 patent graft, patent stent with moderate ISR, and diffuse small vessel disease stable compared to 2017) HFrEF, HLD, hTN, GERD, nephrolithiasis, OSA, stroke, prior postoperative PE, PFO closure 2007 per patient report presents today for follow up of HF.    Past Medical History    Past Medical History:  Diagnosis Date  . Anxiety   . Arthritis   . Barrett's esophagus   . Bowel obstruction ([place]) 02/2009   small bowel  . Chronic chest pain   . Coronary artery disease    a. 1993 CABG; b. 2007 Redo CABG; c. 09/2015 Cath: LM mild dzs, LAD 100ost, RI mild dzs, LCX 60p, OM1 100, RCA 100ost, VG->OM2 min irregs, LIMA->LAD nl, VG->RPDA 30ost, 20d, nl EF->Med rx.  . Depression   . Diverticulitis   . Diverticulosis   . Erectile dysfunction   . Fibromyalgia   . Gastric ulcer   . Gastritis   . GERD (gastroesophageal reflux disease)   . Headache   . Hemorrhoids   . HFrEF (heart failure with reduced ejection fraction) ([place])    a. 09/2016 Echo: EF 55-60%, no rwma, triv AI, mild MR, mildly dil LA. -> as of April 2020: EF 40 to 45%.  Septal dyssynergy/hypokinesis due to postop state but otherwise unable to assess wall motion [name] due to poor study.  . History of myocardial infarct at age less than 51 years   . Hyperlipidemia   . Hypertension   . Kidney stones    going to [facility]  June 26 to see kidney specialist  . Lightheadedness   . Mass on back   . Nephrolithiasis   . Orthostatic hypotension    a. Improved after discontinuation of metoprolol.  . OSA (obstructive sleep apnea)    a. On CPAP.  [name] Pulmonary embolism ([place]) 01/2006   Post-op/treated  . Skin lesions, generalized   .  Stroke [facility]) 1993   right side weakness no blood thinners   on Aspirin  . Syncope and collapse   . Urticaria    Past Surgical History:  Procedure Laterality Date  . BACK SURGERY    . bowel obstruction  01/2009  . CARDIAC CATHETERIZATION  11/2006   patent grafts. Significant OM3 disease and occluded diagonals.  [name] CARDIAC CATHETERIZATION  06/2010   patent grafts. significant ISR in proximal LCX but OM2 is bypassed and gives retrograde flow to OM3.   [name] CARDIAC CATHETERIZATION  06/2013   [facility]: Patent grafts with 60% proximal in-stent restenosis in the left circumflex with FFR of 0.85  . CARDIAC CATHETERIZATION Left 09/25/2015   Procedure: Left Heart Cath and Cors/Grafts Angiography;  Surgeon: Wellington Hampshire, MD;  Location: [place] CV LAB;  Service: Cardiovascular;  Laterality: Left;  . CERVICAL FUSION    . CHOLECYSTECTOMY    . COLON SURGERY  01/2009   BOWEL RESECTION DUE TO SMALL BOWEL OBSTRUCTION  . COLONOSCOPY    . COLONOSCOPY WITH PROPOFOL N/A 10/28/2017   Procedure: COLONOSCOPY WITH PROPOFOL;  Surgeon: Lollie Sails, MD;  Location: [facility] ENDOSCOPY;  Service: Endoscopy;  Laterality: N/A;  . CORONARY ANGIOPLASTY  2006  . CORONARY ARTERY BYPASS GRAFT  1993/01/2006   redo at [facility]. LIMA to LAD, SVG to OM2 and SVG to RPDA  . CYSTOSCOPY WITH STENT PLACEMENT Bilateral 09/24/2015   Procedure: CYSTOSCOPY WITH BILATERAL RETROGRADES, BILATERAL STENT PLACEMENT;  Surgeon: Festus Aloe, MD;  Location: [facility] ORS;  Service: Urology;  Laterality: Bilateral;  . ESOPHAGOGASTRODUODENOSCOPY N/A 10/25/2014   Procedure: ESOPHAGOGASTRODUODENOSCOPY (EGD);  Surgeon: Lollie Sails, MD;  Location: [facility] ENDOSCOPY;  Service: Endoscopy;  Laterality: N/A;  . ESOPHAGOGASTRODUODENOSCOPY (EGD) WITH PROPOFOL N/A 02/03/2017   Procedure: ESOPHAGOGASTRODUODENOSCOPY (EGD) WITH PROPOFOL;  Surgeon: Lollie Sails, MD;  Location: [facility] ENDOSCOPY;  Service: Endoscopy;  Laterality: N/A;  . ESOPHAGOGASTRODUODENOSCOPY  (EGD) WITH PROPOFOL N/A 04/18/2017   Procedure: ESOPHAGOGASTRODUODENOSCOPY (EGD) WITH PROPOFOL;  Surgeon: Lollie Sails, MD;  Location: [facility] ENDOSCOPY;  Service: Endoscopy;  Laterality: N/A;  . EYE SURGERY Bilateral    Cataract Extraction with IOL  . HERNIA REPAIR    . INGUINAL HERNIA REPAIR Bilateral 01/23/2016   Procedure: HERNIA REPAIR INGUINAL ADULT BILATERAL;  Surgeon: Leonie Green, MD;  Location: [facility] ORS;  Service: General;  Laterality: Bilateral;  . LEFT HEART CATH AND CORS/GRAFTS ANGIOGRAPHY N/A 12/13/2019   Procedure: LEFT HEART CATH AND CORS/GRAFTS ANGIOGRAPHY;  Surgeon: Wellington Hampshire, MD;  Location: [place] CV LAB;  Service: Cardiovascular;  Laterality: N/A;  . LUMBAR LAMINECTOMY/DECOMPRESSION MICRODISCECTOMY Left 10/01/2016   Procedure: Left Lumbar four-five Laminotomy for resection of synovial cyst;  Surgeon: Erline Levine, MD;  Location: [place];  Service: Neurosurgery;  Laterality: Left;  left  . NECK SURGERY  06/2009  . SHOULDER SURGERY  2010    Allergies  Allergies  Allergen Reactions  . Amitriptyline Other (See Comments)    Pt unsure of what happens  . Cyclobenzaprine Other (See Comments) and Hives    Pt unsure what hapens  . Entresto [Sacubitril-Valsartan] Anaphylaxis  . Berniece Salines Flavor Diarrhea  . Fish Oil Itching  . Lidocaine Nausea And Vomiting, Other (See Comments) and Nausea Only    Nasal Spray (only time had reaction)  . Omega-3 Fatty Acids-Vitamin E Itching  . Other Diarrhea and Itching    Reactive agents: Onions, bacon, steak  . Percocet [Oxycodone-Acetaminophen] Other (See Comments)    Lowers BP  . Sulfa Antibiotics Itching    History of Present Illness    Willie Clark is a 68 y.o. male with a hx of CAD (CABG 1993, redo CABG 2007, LHC 12/13/19 patent graft, patent stent with moderate ISR, and diffuse small vessel disease stable compared to 2017) HFrEF, HLD, HTN, GERD, nephrolithiasis, OSA, hypokalemia, stroke, prior postoperative PE, PFO  closure 2007 per patient report. He was last seen 01/10/20.  Recent admission 12/12/19 with chest pain. Echo 8/22/1  EF 40-45%, mild LVH, gr1DD, mild AI. Cardiac cath via left radial artery performed 12/12/19 with severe underlying three-vessel CAD with patent grafts ad no significant change since 2017 with noted small vessel disease. EF previously 40-45% now 35-40% by cath, amlodipine and indapamide were discontinued and Losartan and Coreg added. His rosuvastatin was increased to 40mg  daily due to LDL of 84.   Of note, CTA 12/11/19 showed "mild extension of hyperdense contrast across interatrial septum suggests a atrial septal defect involving the foramen ovale."   Seen in follow up 30/21 with catheterization site healing well.  Despite recommendations by cardiology at discharge his rosuvastatin was not increased nor indapamide discontinued.  At clinic visit his losartan was transitioned to [facility] and rosuvastatin increased to 40 mg daily.  However he had subsequent allergic  reaction to Entresto (rash, itching) treated with Decadron, Benadryl, Epinephrine and this was discontinued.  Seen in clinic 01/10/20. He had weight gain of 5 pounds over 3 weeks. Recommended to take Lasix 40mg  x2 days as he was only taking once per week. He was also started on Spironolactone for optimization of HF therapies.   Present today for follow up. Notes LE improved since addition of low-dose spironolactone.  Still notes intermittent lower extremity edema.  He is taking his Lasix approximately once per week.  Endorses eating low-salt, heart healthy diet.  Denies orthopnea, PND.  Reports no chest pain, pressure, tightness.  EKGs/Labs/Other Studies Reviewed:   The following studies were reviewed today: LHC 12/20/19  Ost LAD lesion is 100% stenosed.  Ost 1st Mrg to 1st Mrg lesion is 100% stenosed.  The left ventricular ejection fraction is 35-45% by visual estimate.  There is moderate left ventricular systolic  dysfunction.  LV end diastolic pressure is mildly elevated.  Ost RCA to Prox RCA lesion is 100% stenosed.  SVG graft was visualized by angiography and is moderate in size.  The graft exhibits mild .  LIMA graft was visualized by angiography and is normal in caliber.  The graft exhibits no disease.  2nd RPL lesion is 100% stenosed.  3rd RPL lesion is 100% stenosed.  SVG graft was visualized by angiography and is normal in caliber.  The graft exhibits mild .  2nd Mrg lesion is 100% stenosed.  Prox Cx to Mid Cx lesion is 40% stenosed.   1.  Severe underlying three-vessel coronary artery disease with patent grafts including LIMA to LAD, SVG to OM and SVG to right PDA.  Patent stent in the native mid left circumflex with moderate in-stent restenosis.  No significant change in coronary anatomy since most recent cardiac catheterization in 2017.  The patient does have diffuse small vessel disease. 2.  Moderately reduced LV systolic function with an EF of 35 to 40% with mildly elevated left ventricular end-diastolic pressure at 15 mmHg.   Recommendations: Continue medical therapy for coronary artery disease and chronic systolic heart failure.  The patient is not able to tolerate long-acting nitroglycerin due to headaches. For cardiomyopathy, I discontinued amlodipine and indapamide and will start him on small dose carvedilol and losartan.  We could consider switching to [facility] as an outpatient.   Echo 12/12/19 1. Left ventricular ejection fraction, by estimation, is 40 to 45%. The  left ventricle has mildly decreased function. Left ventricular endocardial  border not optimally defined to evaluate regional wall motion. There is  mild left ventricular hypertrophy.   Left ventricular diastolic parameters are consistent with Grade I  diastolic dysfunction (impaired relaxation).   2. Right ventricular systolic function is normal. The right ventricular  size is normal. There is normal  pulmonary artery systolic pressure. The  estimated right ventricular systolic pressure is 16.0 mmHg.   3. Left atrial size was mildly dilated.   4. The mitral valve is normal in structure. No evidence of mitral valve  regurgitation. No evidence of mitral stenosis.   5. The aortic valve is normal in structure. Aortic valve regurgitation is  mild. Mild aortic valve sclerosis is present, with no evidence of aortic  valve stenosis.   CT 12/12/19 FINDINGS: Cardiovascular: There is adequate opacification of the pulmonary arterial tree. There is no intraluminal filling defect identified to suggest acute pulmonary embolism. The central pulmonary arteries are of normal caliber. Coronary artery bypass grafting has been performed. There is extensive coronary  artery calcification as well as evidence of prior stenting involving the native coronary arteries. Cardiac size is at the upper limits of normal. Mild extension of hyperdense contrast across the interatrial septum suggests a atrial septal defect involving the foramen ovale. No pericardial effusion. The thoracic aorta demonstrates mild scattered atherosclerotic calcification and is of normal caliber.   Mediastinum/Nodes: No pathologic thoracic adenopathy. Thyroid unremarkable. Esophagus unremarkable.   Lungs/Pleura: Lungs are clear. No pneumothorax or pleural effusion. Central airways are widely patent.   Upper Abdomen: No acute abnormality.   Musculoskeletal: No chest wall abnormality. No acute or significant osseous findings.   Review of the MIP images confirms the above findings.   IMPRESSION: 1. No evidence of acute pulmonary embolism. Lungs are clear. 2. 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 3. Aortic atherosclerosis.   Aortic Atherosclerosis (ICD10-I70.0).   EKG: No EKG today.  EKG independently reviewed from 12/20/2019 shows SR 64 and no acute ST/T wave changes.   Recent  Labs: 12/11/2019: ALT 36; Hemoglobin 14.4; Platelets 167 12/12/2019: Magnesium 2.7 01/17/2020: BUN 11; Creatinine, Ser 0.87; Potassium 3.7; Sodium 138  Recent Lipid Panel    Component Value Date/Time   CHOL 181 12/13/2019 0350   TRIG 312 (H) 12/13/2019 0350   HDL 35 (L) 12/13/2019 0350   CHOLHDL 5.2 12/13/2019 0350   VLDL 62 (H) 12/13/2019 0350   LDLCALC 84 12/13/2019 0350    Home Medications   No outpatient medications have been marked as taking for the 01/31/20 encounter (Appointment) with Loel Dubonnet, NP.     Review of Systems   Review of Systems  Constitutional: Negative for chills, fever and malaise/fatigue.  Cardiovascular: Positive for dyspnea on exertion and leg swelling. Negative for chest pain, near-syncope, orthopnea, palpitations and syncope.  Respiratory: Negative for cough, shortness of breath and wheezing.   Gastrointestinal: Negative for nausea and vomiting.  Neurological: Negative for dizziness, light-headedness and weakness.   All other systems reviewed and are otherwise negative except as noted above.  Physical Exam    VS:  There were no vitals taken for this visit. , BMI There is no height or weight on file to calculate BMI. GEN: Well nourished, overweight, well developed, in no acute distress. HEENT: normal. Neck: Supple, no JVD, carotid bruits, or masses. Cardiac: RRR, no murmurs, rubs, or gallops. No clubbing, cyanosis,.  Lower extremities bilateral non pitting pedal edema..  Radials/DP/PT 2+ and equal bilaterally.  Respiratory:  Respirations regular and unlabored, clear to auscultation bilaterally. GI: Soft, nontender, nondistended, BS + x 4. MS: No deformity or atrophy. Skin: Warm and dry, no rash Neuro:  Strength and sensation are intact. Psych: Normal affect.  Assessment & Plan   1. CAD s/p CABG - Cardiac cath 12/13/19 with severe underlying three-vessel CAD with patent grafts and no significant change since 2017. Small vessel disease with  collaterals. Recommended for medical therapy. GDMT includes aspirin, Coreg, Rosuvastatin.  No anginal symptoms.  No indication for ischemic evaluation this time.  2. Chronic systolic HF - EF slightly lower 35-40% during recent admission.  He was trialed and Entresto though had anaphylactic reaction and this was subsequently discontinued.  Tolerates losartan without side effect. GDMT Losartan, Coreg, Spironolactone, Furosemide.  Change spironolactone to 25 mg daily.  Change Lasix to 40 mg 3 times per week.  Repeat CMP in 1 week.  3. HLD - LDL 84 during recent admission with elevated triglyceride.  12/20/2019 his rosuvastatin was increased to 40 mg daily.  Repeat lipid panel in 1 week.  4. HTN - BP well controlled. Continue current antihypertensive regimen.   5. Hx of PFO closure 2007 at [place] 12/11/19 suggestive of "small atrial septal defect involving the foramen ovale. Echo 12/12/19 RV normal size and function, LA mildly dilated, RA normal in size.  Discussed with primary cardiologist.  Not of concern.  Disposition: Follow up in 1 month(s) with Dr. Fletcher Anon or APP.   Loel Dubonnet, NP 01/30/2020, 8:24 PM

## 2020-01-31 ENCOUNTER — Other Ambulatory Visit: Payer: Self-pay

## 2020-01-31 ENCOUNTER — Ambulatory Visit: Payer: Medicare HMO | Admitting: Family

## 2020-01-31 ENCOUNTER — Encounter: Payer: Self-pay | Admitting: Family

## 2020-01-31 VITALS — BP 130/78 | HR 61 | Ht 69.0 in | Wt 199.1 lb

## 2020-01-31 DIAGNOSIS — I5042 Chronic combined systolic (congestive) and diastolic (congestive) heart failure: Secondary | ICD-10-CM | POA: Diagnosis not present

## 2020-01-31 DIAGNOSIS — I25118 Atherosclerotic heart disease of native coronary artery with other forms of angina pectoris: Secondary | ICD-10-CM | POA: Diagnosis not present

## 2020-01-31 DIAGNOSIS — E785 Hyperlipidemia, unspecified: Secondary | ICD-10-CM

## 2020-01-31 MED ORDER — SPIRONOLACTONE 25 MG PO TABS: 25.0000 mg | ORAL_TABLET | Freq: Every day | ORAL | 1 refills | Status: DC

## 2020-01-31 MED ORDER — FUROSEMIDE 40 MG PO TABS: 40.0000 mg | ORAL_TABLET | ORAL | 0 refills | Status: DC

## 2020-01-31 NOTE — Patient Instructions (Addendum)
Medication Instructions:  Your physician has recommended you make the following change in your medication:   CHANGE Lasix to 40mg  three times per week  CHANGE Spironolactone to 25mg  (one tablet) daily  *If you need a refill on your cardiac medications before your next appointment, please call your pharmacy*  Lab Work: Your physician recommends that you return for lab work in 1 week at [facility] for [facility] and lipid panel. Please have this done at the [facility] and be fasting prior to these labs.  If you have labs (blood work) drawn today and your tests are completely normal, you will receive your results only by: [name] MyChart Message (if you have MyChart) OR . A paper copy in the mail If you have any lab test that is abnormal or we need to change your treatment, we will call you to review the results.  Testing/Procedures: None ordered today.   Follow-Up: At [facility], you and your health needs are our priority.  As part of our continuing mission to provide you with exceptional heart care, we have created designated Provider Care Teams.  These Care Teams include your primary Cardiologist (physician) and Advanced Practice Providers (APPs -  Physician Assistants and Nurse Practitioners) who all work together to provide you with the care you need, when you need it.  We recommend signing up for the patient portal called "MyChart".  Sign up information is provided on this After Visit Summary.  MyChart is used to connect with patients for Virtual Visits (Telemedicine).  Patients are able to view lab/test results, encounter notes, upcoming appointments, etc.  Non-urgent messages can be sent to your provider as well.   To learn more about what you can do with MyChart, go to [email or web address].    Your next appointment:   1 month(s)  The format for your next appointment:   In Person  Provider:   You may see [name], MD or one of the following Advanced Practice Providers on  your designated Care Team:    Murray Hodgkins, NP  Laurann Montana, NP  Christell Faith, PA-C  Marrianne Mood, PA-C  Cadence Kathlen Mody, [place]  Other Instructions  Call our office if you have weight gain of 2 pounds overnight or 5 pounds in one week.   Continue low salt, heart healthy diet.   Keep your feet up when sitting.

## 2020-02-07 ENCOUNTER — Other Ambulatory Visit
Admission: RE | Admit: 2020-02-07 | Discharge: 2020-02-07 | Disposition: A | Discharge status: Home / Self Care | Payer: Medicare HMO | Attending: Family | Admitting: Family

## 2020-02-07 ENCOUNTER — Other Ambulatory Visit: Payer: Self-pay

## 2020-02-07 DIAGNOSIS — I25118 Atherosclerotic heart disease of native coronary artery with other forms of angina pectoris: Secondary | ICD-10-CM

## 2020-02-07 DIAGNOSIS — I5042 Chronic combined systolic (congestive) and diastolic (congestive) heart failure: Secondary | ICD-10-CM | POA: Insufficient documentation

## 2020-02-07 DIAGNOSIS — E785 Hyperlipidemia, unspecified: Secondary | ICD-10-CM | POA: Insufficient documentation

## 2020-02-07 LAB — COMPREHENSIVE METABOLIC PANEL
ALT: 28 U/L (ref 0–44)
AST: 30 U/L (ref 15–41)
Albumin: 4.2 g/dL (ref 3.5–5.0)
Alkaline Phosphatase: 51 U/L (ref 38–126)
Anion gap: 10 (ref 5–15)
BUN: 10 mg/dL (ref 8–23)
CO2: 29 mmol/L (ref 22–32)
Calcium: 9.2 mg/dL (ref 8.9–10.3)
Chloride: 100 mmol/L (ref 98–111)
Creatinine, Ser: 0.81 mg/dL (ref 0.61–1.24)
GFR, Estimated: >60 mL/min (ref >60)
Glucose, Bld: 113 mg/dL — ABNORMAL HIGH (ref 70–99)
Potassium: 3.8 mmol/L (ref 3.5–5.1)
Sodium: 139 mmol/L (ref 135–145)
Total Bilirubin: 0.9 mg/dL (ref 0.3–1.2)
Total Protein: 6.4 g/dL — ABNORMAL LOW (ref 6.5–8.1)

## 2020-02-07 LAB — LIPID PANEL
Cholesterol: 165 mg/dL (ref 0–200)
HDL: 41 mg/dL (ref >40)
LDL Cholesterol: 61 mg/dL (ref 0–99)
Total CHOL/HDL Ratio: 4 RATIO
Triglycerides: 317 mg/dL — ABNORMAL HIGH (ref <150)
VLDL: 63 mg/dL — ABNORMAL HIGH (ref 0–40)

## 2020-02-08 ENCOUNTER — Telehealth: Payer: Self-pay | Admitting: Family

## 2020-02-08 MED ORDER — ICOSAPENT ETHYL 1 G PO CAPS: 2.0000 g | ORAL_CAPSULE | Freq: Two times a day (BID) | ORAL | 1 refills | Status: DC

## 2020-02-08 NOTE — Telephone Encounter (Signed)
The patient called back for his results.  I have discussed these with him and he voices understanding.  I have advised him of Caitlin, NP's recommendations to: 1) Start vascepa 2 grams by mouth twice daily   The patient is agreeable. He is aware this may require a Prior Authorization and we will keep him updated of this if this is the case.  The patient voices understanding and is agreeable.

## 2020-02-08 NOTE — Telephone Encounter (Signed)
Willie Dubonnet, NP  02/07/2020 8:11 AM EDT     Kidney and liver function normal. Normal electrolytes. LDL now at goal of <70 - this is a great result! His triglycerides remain elevated. Please start Vascepa 2g twice daily.

## 2020-02-08 NOTE — Telephone Encounter (Signed)
Attempted to call the patient. No answer- I left a message to please call back.  

## 2020-02-17 ENCOUNTER — Other Ambulatory Visit: Payer: Self-pay | Admitting: Internal Medicine

## 2020-02-17 DIAGNOSIS — I5033 Acute on chronic diastolic (congestive) heart failure: Secondary | ICD-10-CM

## 2020-02-23 ENCOUNTER — Other Ambulatory Visit: Payer: Self-pay

## 2020-02-23 ENCOUNTER — Other Ambulatory Visit: Payer: Self-pay | Admitting: Internal Medicine

## 2020-02-23 ENCOUNTER — Ambulatory Visit
Admission: RE | Admit: 2020-02-23 | Discharge: 2020-02-23 | Disposition: A | Discharge status: Home / Self Care | Payer: Medicare HMO | Source: Ambulatory Visit | Attending: Internal Medicine | Admitting: Internal Medicine

## 2020-02-23 DIAGNOSIS — I5033 Acute on chronic diastolic (congestive) heart failure: Secondary | ICD-10-CM | POA: Insufficient documentation

## 2020-02-23 DIAGNOSIS — I38 Endocarditis, valve unspecified: Secondary | ICD-10-CM

## 2020-02-23 DIAGNOSIS — I714 Abdominal aortic aneurysm, without rupture, unspecified: Secondary | ICD-10-CM

## 2020-02-23 DIAGNOSIS — I509 Heart failure, unspecified: Secondary | ICD-10-CM

## 2020-02-23 HISTORY — DX: Abdominal aortic aneurysm, without rupture, unspecified: I71.40

## 2020-02-24 ENCOUNTER — Telehealth: Payer: Self-pay | Admitting: Cardiovascular Disease

## 2020-02-24 ENCOUNTER — Encounter: Payer: Self-pay | Admitting: Cardiovascular Disease

## 2020-02-24 ENCOUNTER — Telehealth: Payer: Self-pay | Admitting: *Deleted

## 2020-02-24 ENCOUNTER — Ambulatory Visit: Payer: Medicare HMO | Admitting: Cardiovascular Disease

## 2020-02-24 VITALS — BP 104/76 | HR 60 | Ht 69.0 in | Wt 202.0 lb

## 2020-02-24 DIAGNOSIS — I5022 Chronic systolic (congestive) heart failure: Secondary | ICD-10-CM

## 2020-02-24 DIAGNOSIS — E785 Hyperlipidemia, unspecified: Secondary | ICD-10-CM

## 2020-02-24 DIAGNOSIS — I10 Essential (primary) hypertension: Secondary | ICD-10-CM

## 2020-02-24 DIAGNOSIS — I25118 Atherosclerotic heart disease of native coronary artery with other forms of angina pectoris: Secondary | ICD-10-CM | POA: Diagnosis not present

## 2020-02-24 DIAGNOSIS — Z0279 Encounter for issue of other medical certificate: Secondary | ICD-10-CM

## 2020-02-24 NOTE — Telephone Encounter (Signed)
Form placed on Dr. Tyrell Antonio desk.

## 2020-02-24 NOTE — Patient Instructions (Signed)
Medication Instructions:  Your physician has recommended you make the following change in your medication:   STOP Enalapril. STOP Indapamide. STOP Metoprolol.  *If you need a refill on your cardiac medications before your next appointment, please call your pharmacy*   Lab Work: None ordered.  If you have labs (blood work) drawn today and your tests are completely normal, you will receive your results only by: [name] MyChart Message (if you have MyChart) OR . A paper copy in the mail If you have any lab test that is abnormal or we need to change your treatment, we will call you to review the results.   Testing/Procedures: None ordered.   Follow-Up: At [facility], you and your health needs are our priority.  As part of our continuing mission to provide you with exceptional heart care, we have created designated Provider Care Teams.  These Care Teams include your primary Cardiologist (physician) and Advanced Practice Providers (APPs -  Physician Assistants and Nurse Practitioners) who all work together to provide you with the care you need, when you need it.  We recommend signing up for the patient portal called "MyChart".  Sign up information is provided on this After Visit Summary.  MyChart is used to connect with patients for Virtual Visits (Telemedicine).  Patients are able to view lab/test results, encounter notes, upcoming appointments, etc.  Non-urgent messages can be sent to your provider as well.   To learn more about what you can do with MyChart, go to [email or web address].    Your next appointment:   4 month(s)  The format for your next appointment:   In Person  Provider:   You may see [name], MD or one of the following Advanced Practice Providers on your designated Care Team:    Murray Hodgkins, NP  Christell Faith, PA-C  Marrianne Mood, PA-C  Cadence Kathlen Mody, [place]    Other Instructions None

## 2020-02-24 NOTE — Telephone Encounter (Signed)
Patient brought forms in from [place]. Payment has been received, sent to billing, and placed in nurse box for Dr. Fletcher Anon  TB 02/24/20

## 2020-02-24 NOTE — Telephone Encounter (Signed)
Resident in to see Dr. Fletcher Anon today for follow up. At visit notified pt that I will contact [facility] mail order pharmacy to cancel the following prescriptions: Enalapril, Indapamide, and Metoprolol. Cancelled per Dr. Tyrell Antonio instructions and pt is no longer taking these meds. Spoke to [facility] and meds are cancelled. Pt aware. Med list updated.

## 2020-02-24 NOTE — Progress Notes (Signed)
Cardiology Office Note   Date:  02/24/2020   ID:  Willie Clark, DOB 28-Aug-1950, MRN [id number]  PCP:  [name], MD  Cardiologist:   [name], MD   Chief Complaint  Patient presents with  . office visit    1 month F/U-Patient reports pain in left side of neck for the past 3 days; Meds verbally reviewed with patient.      History of Present Illness: Willie Clark is a 69 y.o. male who presents for regarding coronary artery disease and chronic systolic heart failure.  He has known history of coronary artery disease. He is status post coronary artery bypass graft surgery in 1993 and redo in 2007.   He suffers from fibromyalgia and chronic pain.  He had GI symptoms in 2015 due to documented gastric ulcer. He had neck surgery in 3220 without complications. He has known history of chronic neck pain with 2 previous cervical spine surgeries.    He was hospitalized in August of this year with chest pain.  Echocardiogram showed an EF of 40 to 45%.  Cardiac catheterization showed severe underlying three-vessel coronary arteries with patent grafts and no significant change since 2017.  The patient antihypertensive medications were switched to losartan and carvedilol.  Rosuvastatin was increased to 40 mg daily. Losartan was subsequently switched to [facility].  However, the patient had allergic reaction with rash and itching that had to be treated in the emergency room.  Spironolactone was subsequently added. He has been doing reasonably well.  He has chronic intermittent chest pain with no recent worsening.  Still with mild exertional dyspnea which has been stable.  He takes furosemide 3 times per week.  Multiple duplicate medications are noted in his medication list and this was discussed with him today.   Past Medical History:  Diagnosis Date  . Anxiety   . Arthritis   . Barrett's esophagus   . Bowel obstruction ([place]) 02/2009   small bowel  . Chronic chest pain   . Coronary  artery disease    a. 1993 CABG; b. 2007 Redo CABG; c. 09/2015 Cath: LM mild dzs, LAD 100ost, RI mild dzs, LCX 60p, OM1 100, RCA 100ost, VG->OM2 min irregs, LIMA->LAD nl, VG->RPDA 30ost, 20d, nl EF->Med rx.  . Depression   . Diverticulitis   . Diverticulosis   . Erectile dysfunction   . Fibromyalgia   . Gastric ulcer   . Gastritis   . GERD (gastroesophageal reflux disease)   . Headache   . Hemorrhoids   . HFrEF (heart failure with reduced ejection fraction) ([place])    a. 09/2016 Echo: EF 55-60%, no rwma, triv AI, mild MR, mildly dil LA. -> as of April 2020: EF 40 to 45%.  Septal dyssynergy/hypokinesis due to postop state but otherwise unable to assess wall motion [name] due to poor study.  . History of myocardial infarct at age less than 89 years   . Hyperlipidemia   . Hypertension   . Kidney stones    going to [facility]  June 26 to see kidney specialist  . Lightheadedness   . Mass on back   . Nephrolithiasis   . Orthostatic hypotension    a. Improved after discontinuation of metoprolol.  . OSA (obstructive sleep apnea)    a. On CPAP.  [name] Pulmonary embolism ([place]) 01/2006   Post-op/treated  . Skin lesions, generalized   . Stroke [facility]) 1993   right side weakness no blood thinners   on Aspirin  .  Syncope and collapse   . Urticaria     Past Surgical History:  Procedure Laterality Date  . BACK SURGERY    . bowel obstruction  01/2009  . CARDIAC CATHETERIZATION  11/2006   patent grafts. Significant OM3 disease and occluded diagonals.  [name] CARDIAC CATHETERIZATION  06/2010   patent grafts. significant ISR in proximal LCX but OM2 is bypassed and gives retrograde flow to OM3.   [name] CARDIAC CATHETERIZATION  06/2013   [facility]: Patent grafts with 60% proximal in-stent restenosis in the left circumflex with FFR of 0.85  . CARDIAC CATHETERIZATION Left 09/25/2015   Procedure: Left Heart Cath and Cors/Grafts Angiography;  Surgeon: Wellington Hampshire, MD;  Location: [place] [facility];  Service:  Cardiovascular;  Laterality: Left;  . CERVICAL FUSION    . CHOLECYSTECTOMY    . COLON SURGERY  01/2009   BOWEL RESECTION DUE TO SMALL BOWEL OBSTRUCTION  . COLONOSCOPY    . COLONOSCOPY WITH PROPOFOL N/A 10/28/2017   Procedure: COLONOSCOPY WITH PROPOFOL;  Surgeon: Lollie Sails, MD;  Location: [facility] ENDOSCOPY;  Service: Endoscopy;  Laterality: N/A;  . CORONARY ANGIOPLASTY  2006  . CORONARY ARTERY BYPASS GRAFT  1993/01/2006   redo at [facility]. LIMA to LAD, SVG to OM2 and SVG to RPDA  . CYSTOSCOPY WITH STENT PLACEMENT Bilateral 09/24/2015   Procedure: CYSTOSCOPY WITH BILATERAL RETROGRADES, BILATERAL STENT PLACEMENT;  Surgeon: Festus Aloe, MD;  Location: [facility] ORS;  Service: Urology;  Laterality: Bilateral;  . ESOPHAGOGASTRODUODENOSCOPY N/A 10/25/2014   Procedure: ESOPHAGOGASTRODUODENOSCOPY (EGD);  Surgeon: Lollie Sails, MD;  Location: [facility] ENDOSCOPY;  Service: Endoscopy;  Laterality: N/A;  . ESOPHAGOGASTRODUODENOSCOPY (EGD) WITH PROPOFOL N/A 02/03/2017   Procedure: ESOPHAGOGASTRODUODENOSCOPY (EGD) WITH PROPOFOL;  Surgeon: Lollie Sails, MD;  Location: [facility] ENDOSCOPY;  Service: Endoscopy;  Laterality: N/A;  . ESOPHAGOGASTRODUODENOSCOPY (EGD) WITH PROPOFOL N/A 04/18/2017   Procedure: ESOPHAGOGASTRODUODENOSCOPY (EGD) WITH PROPOFOL;  Surgeon: Lollie Sails, MD;  Location: [facility] ENDOSCOPY;  Service: Endoscopy;  Laterality: N/A;  . EYE SURGERY Bilateral    Cataract Extraction with IOL  . HERNIA REPAIR    . INGUINAL HERNIA REPAIR Bilateral 01/23/2016   Procedure: HERNIA REPAIR INGUINAL ADULT BILATERAL;  Surgeon: Leonie Green, MD;  Location: [facility] ORS;  Service: General;  Laterality: Bilateral;  . LEFT HEART CATH AND CORS/GRAFTS ANGIOGRAPHY N/A 12/13/2019   Procedure: LEFT HEART CATH AND CORS/GRAFTS ANGIOGRAPHY;  Surgeon: Wellington Hampshire, MD;  Location: [place] [facility];  Service: Cardiovascular;  Laterality: N/A;  . LUMBAR LAMINECTOMY/DECOMPRESSION MICRODISCECTOMY Left 10/01/2016    Procedure: Left Lumbar four-five Laminotomy for resection of synovial cyst;  Surgeon: Erline Levine, MD;  Location: [place];  Service: Neurosurgery;  Laterality: Left;  left  . NECK SURGERY  06/2009  . SHOULDER SURGERY  2010     Current Outpatient Medications  Medication Sig Dispense Refill  . albuterol (PROVENTIL HFA;VENTOLIN HFA) 108 (90 Base) MCG/ACT inhaler Inhale into the lungs every 4 (four) hours as needed for wheezing or shortness of breath.    [name] aspirin EC 81 MG tablet Take 81 mg by mouth daily.    . carvedilol (COREG) 3.125 MG tablet Take 1 tablet (3.125 mg total) by mouth 2 (two) times daily with a meal. 60 tablet 0  . citalopram (CELEXA) 20 MG tablet Take 20 mg by mouth daily.     [name] dicyclomine (BENTYL) 10 MG capsule Take 10 mg by mouth 3 (three) times daily.    [name] docusate sodium (COLACE) 100 MG capsule Take 100  mg by mouth 2 (two) times daily.    . enalapril (VASOTEC) 20 MG tablet Take 20 mg by mouth daily.     [name] EPINEPHrine 0.3 mg/0.3 mL IJ SOAJ injection Inject 0.3 mg into the muscle as needed.     Eduard Roux 140 MG/ML SOAJ Inject 140 mg into the skin every 28 (twenty-eight) days.    . fexofenadine (ALLEGRA) 180 MG tablet Take 180 mg by mouth daily.    [name] FIBER PO Take 1 tablet by mouth daily.     . finasteride (PROSCAR) 5 MG tablet Take 5 mg by mouth daily.    . furosemide (LASIX) 40 MG tablet Take 1 tablet (40 mg total) by mouth 3 (three) times a week. 90 tablet 0  . gabapentin (NEURONTIN) 800 MG tablet Take 1 tablet by mouth 3 (three) times daily.    [name] icosapent Ethyl (VASCEPA) 1 g capsule Take 2 capsules (2 g total) by mouth 2 (two) times daily. 360 capsule 1  . indapamide (LOZOL) 2.5 MG tablet Take 2.5 mg by mouth daily.  11  . losartan (COZAAR) 25 MG tablet Take 1 tablet (25 mg total) by mouth daily. 90 tablet 1  . Methylcellulose, Laxative, 500 MG TABS Take 1 tablet by mouth daily.    . metoprolol succinate (TOPROL-XL) 25 MG 24 hr tablet Take 12.5 mg by mouth  daily.     . Multiple Vitamins-Minerals (HAIR/SKIN/NAILS PO) Take 1 tablet by mouth 3 (three) times daily.     . nitroGLYCERIN (NITROSTAT) 0.4 MG SL tablet Place 0.4 mg under the tongue every 5 (five) minutes as needed for chest pain.     . pantoprazole (PROTONIX) 40 MG tablet Take 40 mg by mouth 2 (two) times daily.     . potassium chloride SA (K-DUR,KLOR-CON) 20 MEQ tablet Take 20 mEq by mouth daily.    . psyllium (REGULOID) 0.52 g capsule Take 0.52 g by mouth daily.     [name] rOPINIRole (REQUIP) 0.5 MG tablet Take 1 tablet by mouth at bedtime.    . rosuvastatin (CRESTOR) 40 MG tablet Take 1 tablet (40 mg total) by mouth daily. 90 tablet 1  . spironolactone (ALDACTONE) 25 MG tablet Take 1 tablet (25 mg total) by mouth daily. 90 tablet 1  . triamcinolone ointment (KENALOG) 0.1 % Apply 1 application topically 2 (two) times daily.    . vitamin B-12 (CYANOCOBALAMIN) 1000 MCG tablet Take 1,000 mcg by mouth daily.     No current facility-administered medications for this visit.    Allergies:   Amitriptyline, Cyclobenzaprine, Entresto [sacubitril-valsartan], Bacon flavor, Fish oil, Lidocaine, Omega-3 fatty acids-vitamin e, Other, Percocet [oxycodone-acetaminophen], and Sulfa antibiotics    Social History:  The patient  reports that he quit smoking about 28 years ago. His smoking use included cigarettes. He smoked 2.50 packs per day. He has never used smokeless tobacco. He reports previous alcohol use. He reports that he does not use drugs.   Family History:  The patient's family history includes Heart attack in his father; Heart disease (age of onset: 33) in his father.    ROS:  Please see the history of present illness.   Otherwise, review of systems are positive for none.   All other systems are reviewed and negative.    PHYSICAL EXAM: VS:  BP 104/76 (BP Location: Left Arm, Patient Position: Sitting, Cuff Size: Normal)   Pulse 60   Ht 5\' 9"  (1.753 m)   Wt 202 lb (91.6 kg)   SpO2 94%  BMI  29.83 kg/m  , BMI Body mass index is 29.83 kg/m. GEN: Well nourished, well developed, in no acute distress  HEENT: normal  Neck: no JVD, carotid bruits, or masses Cardiac: RRR; no murmurs, rubs, or gallops,no edema  Respiratory:  clear to auscultation bilaterally, normal work of breathing GI: soft, nontender, nondistended, + BS MS: no deformity or atrophy  Skin: warm and dry, no rash Neuro:  Strength and sensation are intact Psych: euthymic mood, full affect   EKG:  EKG is not  ordered today.    Recent Labs: 12/11/2019: Hemoglobin 14.4; Platelets 167 12/12/2019: Magnesium 2.7 02/07/2020: ALT 28; BUN 10; Creatinine, Ser 0.81; Potassium 3.8; Sodium 139    Lipid Panel    Component Value Date/Time   CHOL 165 02/07/2020 0658   TRIG 317 (H) 02/07/2020 0658   HDL 41 02/07/2020 0658   CHOLHDL 4.0 02/07/2020 0658   VLDL 63 (H) 02/07/2020 0658   LDLCALC 61 02/07/2020 0658      Wt Readings from Last 3 Encounters:  02/24/20 202 lb (91.6 kg)  01/31/20 199 lb 2 oz (90.3 kg)  01/10/20 198 lb (89.8 kg)         ASSESSMENT AND PLAN:  1.  Coronary artery disease involving native coronary arteries with chronic angina: He is overall stable with no worsening symptoms.  Most recent cardiac catheterization August showed patent grafts with stable small vessel disease.  Continue medical therapy.  2.  Chronic systolic heart failure: EF of 35 to 64%: Multiple duplicate medications are noted including the use of both carvedilol and Toprol as well as losartan and enalapril.  I asked him to verify and make sure he is not taking Toprol and enalapril.  Continue carvedilol and losartan.  In addition, he is on spironolactone.  Continue same dose of furosemide 40 mg 3 times a week.  3. Essential hypertension: Blood pressure is controlled.  4. Hyperlipidemia: Continue high-dose rosuvastatin.  Recent lipid profile showed an LDL of 61.  However, triglyceride was elevated at 317.  Since then, Vascepa  was added.  He pays about $100 per month for this and might not be able to afford it long-term but he wants to wait and see the results with it.  5. Mild carotid disease: Previous carotid Doppler showed less than 40% stenosis bilaterally. No need to repeat.  6.  Small abdominal aortic aneurysm: This was noted on ultrasound yesterday measuring 3 cm.  Recommend repeat ultrasound in 2 years which can be done in our office.   Disposition:   FU with me in 4 months  Signed,  [name], MD  02/24/2020 10:24 AM    [place]

## 2020-03-20 NOTE — Telephone Encounter (Signed)
Patient is calling back in to check on status of form Please advise

## 2020-03-21 NOTE — Telephone Encounter (Signed)
Patient called and made aware form is ready for pickup

## 2020-03-21 NOTE — Telephone Encounter (Signed)
Completed form given to [place].

## 2020-03-22 NOTE — Telephone Encounter (Signed)
Payment posted and receipt mailed to patient

## 2020-03-31 ENCOUNTER — Other Ambulatory Visit: Payer: Self-pay | Admitting: Family Medicine

## 2020-04-02 ENCOUNTER — Other Ambulatory Visit: Payer: Self-pay | Admitting: Family

## 2020-04-02 DIAGNOSIS — I5042 Chronic combined systolic (congestive) and diastolic (congestive) heart failure: Secondary | ICD-10-CM

## 2020-04-03 ENCOUNTER — Other Ambulatory Visit: Payer: Self-pay | Admitting: Family Medicine

## 2020-04-03 DIAGNOSIS — R1031 Right lower quadrant pain: Secondary | ICD-10-CM

## 2020-04-04 NOTE — Telephone Encounter (Signed)
Rx request sent to pharmacy.  

## 2020-04-05 ENCOUNTER — Other Ambulatory Visit: Payer: Self-pay | Admitting: Family Medicine

## 2020-04-05 DIAGNOSIS — R1031 Right lower quadrant pain: Secondary | ICD-10-CM

## 2020-04-05 DIAGNOSIS — Z8719 Personal history of other diseases of the digestive system: Secondary | ICD-10-CM

## 2020-04-06 ENCOUNTER — Ambulatory Visit: Admission: RE | Admit: 2020-04-06 | Payer: Medicare HMO | Source: Ambulatory Visit

## 2020-04-06 ENCOUNTER — Telehealth: Payer: Self-pay | Admitting: Family Medicine

## 2020-04-24 ENCOUNTER — Other Ambulatory Visit: Payer: Self-pay | Admitting: Surgery

## 2020-04-24 DIAGNOSIS — R1030 Lower abdominal pain, unspecified: Secondary | ICD-10-CM

## 2020-05-03 ENCOUNTER — Other Ambulatory Visit: Payer: Self-pay | Admitting: Gastroenterology

## 2020-05-03 DIAGNOSIS — R6881 Early satiety: Secondary | ICD-10-CM

## 2020-05-03 DIAGNOSIS — K219 Gastro-esophageal reflux disease without esophagitis: Secondary | ICD-10-CM

## 2020-05-04 ENCOUNTER — Ambulatory Visit
Admission: RE | Admit: 2020-05-04 | Discharge: 2020-05-04 | Disposition: A | Discharge status: Home / Self Care | Payer: Medicare HMO | Source: Ambulatory Visit | Attending: Surgery | Admitting: Surgery

## 2020-05-04 ENCOUNTER — Other Ambulatory Visit: Payer: Self-pay

## 2020-05-04 DIAGNOSIS — R1030 Lower abdominal pain, unspecified: Secondary | ICD-10-CM | POA: Diagnosis not present

## 2020-05-16 ENCOUNTER — Telehealth: Payer: Self-pay | Admitting: Cardiovascular Disease

## 2020-05-16 DIAGNOSIS — I5042 Chronic combined systolic (congestive) and diastolic (congestive) heart failure: Secondary | ICD-10-CM

## 2020-05-16 MED ORDER — SPIRONOLACTONE 25 MG PO TABS: 12.5000 mg | ORAL_TABLET | Freq: Every day | ORAL | 1 refills | Status: DC

## 2020-05-16 NOTE — Telephone Encounter (Signed)
Received fax from [place] requesting refills for Spironolactone. Rx request sent to pharmacy.

## 2020-05-19 ENCOUNTER — Telehealth: Payer: Self-pay | Admitting: Cardiovascular Disease

## 2020-05-19 MED ORDER — ROSUVASTATIN CALCIUM 40 MG PO TABS
40.0000 mg | ORAL_TABLET | Freq: Every day | ORAL | 1 refills | Status: DC
Start: 2020-05-19 — End: 2020-11-17

## 2020-05-19 NOTE — Telephone Encounter (Signed)
Received fax from [place] requesting refills for Rosuvastatin 40 mg. Rx request sent to pharmacy.

## 2020-05-23 ENCOUNTER — Other Ambulatory Visit: Payer: Self-pay | Admitting: Family

## 2020-05-23 DIAGNOSIS — I5042 Chronic combined systolic (congestive) and diastolic (congestive) heart failure: Secondary | ICD-10-CM

## 2020-06-17 ENCOUNTER — Other Ambulatory Visit: Payer: Self-pay | Admitting: Family

## 2020-06-20 ENCOUNTER — Encounter: Payer: Self-pay | Admitting: Cardiovascular Disease

## 2020-06-20 ENCOUNTER — Other Ambulatory Visit: Payer: Self-pay

## 2020-06-20 ENCOUNTER — Inpatient Hospital Stay (HOSPITAL_COMMUNITY)
Admit: 2020-06-20 | Discharge: 2020-06-20 | Disposition: A | Discharge status: Home / Self Care | Payer: Medicare HMO | Attending: Internal Medicine | Admitting: Internal Medicine

## 2020-06-20 ENCOUNTER — Encounter
Admission: EM | Disposition: A | Discharge status: Home / Self Care | Payer: Self-pay | Source: Home / Self Care | Attending: Internal Medicine

## 2020-06-20 ENCOUNTER — Ambulatory Visit: Payer: Medicare HMO | Admitting: Cardiovascular Disease

## 2020-06-20 ENCOUNTER — Emergency Department: Payer: Medicare HMO

## 2020-06-20 ENCOUNTER — Inpatient Hospital Stay
Admission: EM | Admit: 2020-06-20 | Discharge: 2020-06-22 | DRG: 247 | Disposition: A | Discharge status: Home / Self Care | Payer: Medicare HMO | Attending: Internal Medicine | Admitting: Internal Medicine

## 2020-06-20 VITALS — BP 140/80 | HR 76 | Ht 69.0 in | Wt 197.4 lb

## 2020-06-20 DIAGNOSIS — I110 Hypertensive heart disease with heart failure: Secondary | ICD-10-CM | POA: Diagnosis present

## 2020-06-20 DIAGNOSIS — I248 Other forms of acute ischemic heart disease: Secondary | ICD-10-CM | POA: Diagnosis present

## 2020-06-20 DIAGNOSIS — I2510 Atherosclerotic heart disease of native coronary artery without angina pectoris: Secondary | ICD-10-CM | POA: Diagnosis not present

## 2020-06-20 DIAGNOSIS — I739 Peripheral vascular disease, unspecified: Secondary | ICD-10-CM | POA: Diagnosis present

## 2020-06-20 DIAGNOSIS — M797 Fibromyalgia: Secondary | ICD-10-CM | POA: Diagnosis present

## 2020-06-20 DIAGNOSIS — I252 Old myocardial infarction: Secondary | ICD-10-CM

## 2020-06-20 DIAGNOSIS — I25119 Atherosclerotic heart disease of native coronary artery with unspecified angina pectoris: Secondary | ICD-10-CM | POA: Diagnosis not present

## 2020-06-20 DIAGNOSIS — Z8249 Family history of ischemic heart disease and other diseases of the circulatory system: Secondary | ICD-10-CM | POA: Diagnosis not present

## 2020-06-20 DIAGNOSIS — I5022 Chronic systolic (congestive) heart failure: Secondary | ICD-10-CM | POA: Diagnosis not present

## 2020-06-20 DIAGNOSIS — I5020 Unspecified systolic (congestive) heart failure: Secondary | ICD-10-CM

## 2020-06-20 DIAGNOSIS — I249 Acute ischemic heart disease, unspecified: Secondary | ICD-10-CM | POA: Diagnosis present

## 2020-06-20 DIAGNOSIS — I2119 ST elevation (STEMI) myocardial infarction involving other coronary artery of inferior wall: Principal | ICD-10-CM | POA: Diagnosis present

## 2020-06-20 DIAGNOSIS — F32A Depression, unspecified: Secondary | ICD-10-CM | POA: Diagnosis present

## 2020-06-20 DIAGNOSIS — I5042 Chronic combined systolic (congestive) and diastolic (congestive) heart failure: Secondary | ICD-10-CM

## 2020-06-20 DIAGNOSIS — F419 Anxiety disorder, unspecified: Secondary | ICD-10-CM | POA: Diagnosis present

## 2020-06-20 DIAGNOSIS — I214 Non-ST elevation (NSTEMI) myocardial infarction: Secondary | ICD-10-CM | POA: Diagnosis not present

## 2020-06-20 DIAGNOSIS — I10 Essential (primary) hypertension: Secondary | ICD-10-CM

## 2020-06-20 DIAGNOSIS — E785 Hyperlipidemia, unspecified: Secondary | ICD-10-CM | POA: Diagnosis present

## 2020-06-20 DIAGNOSIS — I200 Unstable angina: Secondary | ICD-10-CM

## 2020-06-20 DIAGNOSIS — Z8673 Personal history of transient ischemic attack (TIA), and cerebral infarction without residual deficits: Secondary | ICD-10-CM

## 2020-06-20 DIAGNOSIS — K219 Gastro-esophageal reflux disease without esophagitis: Secondary | ICD-10-CM

## 2020-06-20 DIAGNOSIS — Z981 Arthrodesis status: Secondary | ICD-10-CM

## 2020-06-20 DIAGNOSIS — Z87891 Personal history of nicotine dependence: Secondary | ICD-10-CM

## 2020-06-20 DIAGNOSIS — I25810 Atherosclerosis of coronary artery bypass graft(s) without angina pectoris: Secondary | ICD-10-CM

## 2020-06-20 DIAGNOSIS — I25710 Atherosclerosis of autologous vein coronary artery bypass graft(s) with unstable angina pectoris: Secondary | ICD-10-CM | POA: Diagnosis present

## 2020-06-20 DIAGNOSIS — I2489 Other forms of acute ischemic heart disease: Secondary | ICD-10-CM | POA: Diagnosis present

## 2020-06-20 DIAGNOSIS — Z882 Allergy status to sulfonamides status: Secondary | ICD-10-CM

## 2020-06-20 DIAGNOSIS — I714 Abdominal aortic aneurysm, without rupture: Secondary | ICD-10-CM | POA: Diagnosis present

## 2020-06-20 DIAGNOSIS — I255 Ischemic cardiomyopathy: Secondary | ICD-10-CM | POA: Diagnosis not present

## 2020-06-20 DIAGNOSIS — T82855A Stenosis of coronary artery stent, initial encounter: Secondary | ICD-10-CM | POA: Diagnosis present

## 2020-06-20 DIAGNOSIS — Z888 Allergy status to other drugs, medicaments and biological substances status: Secondary | ICD-10-CM | POA: Diagnosis not present

## 2020-06-20 DIAGNOSIS — Z20822 Contact with and (suspected) exposure to covid-19: Secondary | ICD-10-CM | POA: Diagnosis present

## 2020-06-20 DIAGNOSIS — Z7982 Long term (current) use of aspirin: Secondary | ICD-10-CM | POA: Diagnosis not present

## 2020-06-20 DIAGNOSIS — I25110 Atherosclerotic heart disease of native coronary artery with unstable angina pectoris: Secondary | ICD-10-CM | POA: Diagnosis present

## 2020-06-20 DIAGNOSIS — G4733 Obstructive sleep apnea (adult) (pediatric): Secondary | ICD-10-CM | POA: Diagnosis present

## 2020-06-20 DIAGNOSIS — I2582 Chronic total occlusion of coronary artery: Secondary | ICD-10-CM | POA: Diagnosis present

## 2020-06-20 DIAGNOSIS — Z86711 Personal history of pulmonary embolism: Secondary | ICD-10-CM | POA: Diagnosis not present

## 2020-06-20 DIAGNOSIS — N139 Obstructive and reflux uropathy, unspecified: Secondary | ICD-10-CM | POA: Diagnosis present

## 2020-06-20 DIAGNOSIS — I6523 Occlusion and stenosis of bilateral carotid arteries: Secondary | ICD-10-CM | POA: Diagnosis present

## 2020-06-20 DIAGNOSIS — G459 Transient cerebral ischemic attack, unspecified: Secondary | ICD-10-CM | POA: Diagnosis present

## 2020-06-20 DIAGNOSIS — Z79899 Other long term (current) drug therapy: Secondary | ICD-10-CM | POA: Diagnosis not present

## 2020-06-20 HISTORY — PX: CORONARY STENT INTERVENTION: CATH118234

## 2020-06-20 HISTORY — PX: LEFT HEART CATH AND CORS/GRAFTS ANGIOGRAPHY: CATH118250

## 2020-06-20 HISTORY — DX: ST elevation (STEMI) myocardial infarction involving other coronary artery of inferior wall: I21.19

## 2020-06-20 LAB — CBC WITH DIFFERENTIAL/PLATELET
Abs Immature Granulocytes: 0.05 10*3/uL (ref 0.00–0.07)
Basophils Absolute: 0 10*3/uL (ref 0.0–0.1)
Basophils Relative: 0 %
Eosinophils Absolute: 0.1 10*3/uL (ref 0.0–0.5)
Eosinophils Relative: 1 %
HCT: 42.9 % (ref 39.0–52.0)
Hemoglobin: 14.2 g/dL (ref 13.0–17.0)
Immature Granulocytes: 1 %
Lymphocytes Relative: 27 %
Lymphs Abs: 2.3 10*3/uL (ref 0.7–4.0)
MCH: 32.5 pg (ref 26.0–34.0)
MCHC: 33.1 g/dL (ref 30.0–36.0)
MCV: 98.2 fL (ref 80.0–100.0)
Monocytes Absolute: 0.5 10*3/uL (ref 0.1–1.0)
Monocytes Relative: 6 %
Neutro Abs: 5.6 10*3/uL (ref 1.7–7.7)
Neutrophils Relative %: 65 %
Platelets: 171 10*3/uL (ref 150–400)
RBC: 4.37 MIL/uL (ref 4.22–5.81)
RDW: 12.9 % (ref 11.5–15.5)
WBC: 8.6 10*3/uL (ref 4.0–10.5)
nRBC: 0 % (ref 0.0–0.2)

## 2020-06-20 LAB — COMPREHENSIVE METABOLIC PANEL
ALT: 33 U/L (ref 0–44)
AST: 33 U/L (ref 15–41)
Albumin: 4.6 g/dL (ref 3.5–5.0)
Alkaline Phosphatase: 48 U/L (ref 38–126)
Anion gap: 12 (ref 5–15)
BUN: 16 mg/dL (ref 8–23)
CO2: 28 mmol/L (ref 22–32)
Calcium: 9.5 mg/dL (ref 8.9–10.3)
Chloride: 97 mmol/L — ABNORMAL LOW (ref 98–111)
Creatinine, Ser: 0.82 mg/dL (ref 0.61–1.24)
GFR, Estimated: >60 mL/min (ref >60)
Glucose, Bld: 110 mg/dL — ABNORMAL HIGH (ref 70–99)
Potassium: 3.9 mmol/L (ref 3.5–5.1)
Sodium: 137 mmol/L (ref 135–145)
Total Bilirubin: 0.9 mg/dL (ref 0.3–1.2)
Total Protein: 7.1 g/dL (ref 6.5–8.1)

## 2020-06-20 LAB — POCT ACTIVATED CLOTTING TIME
Activated Clotting Time: 261 seconds
Activated Clotting Time: 273 seconds

## 2020-06-20 LAB — RESP PANEL BY RT-PCR (FLU A&B, COVID) ARPGX2
Influenza A by PCR: NEGATIVE
Influenza B by PCR: NEGATIVE
SARS Coronavirus 2 by RT PCR: NEGATIVE

## 2020-06-20 LAB — TROPONIN I (HIGH SENSITIVITY): Troponin I (High Sensitivity): 667 ng/L (ref <18)

## 2020-06-20 LAB — MRSA PCR SCREENING: MRSA by PCR: NEGATIVE

## 2020-06-20 SURGERY — LEFT HEART CATH AND CORS/GRAFTS ANGIOGRAPHY
Anesthesia: Moderate Sedation

## 2020-06-20 MED ORDER — SODIUM CHLORIDE 0.9 % IV SOLN: INTRAVENOUS | Status: AC

## 2020-06-20 MED ORDER — IOHEXOL 300 MG/ML  SOLN
INTRAMUSCULAR | Status: DC | PRN
  Administered 2020-06-20: 265 mL

## 2020-06-20 MED ORDER — HEPARIN (PORCINE) IN NACL 1000-0.9 UT/500ML-% IV SOLN
INTRAVENOUS | Status: DC | PRN
  Administered 2020-06-20: 500 mL

## 2020-06-20 MED ORDER — LIDOCAINE HCL (PF) 1 % IJ SOLN
INTRAMUSCULAR | Status: DC | PRN
  Administered 2020-06-20: 2 mL

## 2020-06-20 MED ORDER — SODIUM CHLORIDE 0.9% FLUSH: 3.0000 mL | INTRAVENOUS | Status: DC | PRN

## 2020-06-20 MED ORDER — SODIUM CHLORIDE 0.9% FLUSH
3.0000 mL | Freq: Two times a day (BID) | INTRAVENOUS | Status: DC
  Administered 2020-06-20 – 2020-06-22 (×4): 3 mL via INTRAVENOUS

## 2020-06-20 MED ORDER — HEPARIN (PORCINE) 25000 UT/250ML-% IV SOLN: 950.0000 [IU]/h | INTRAVENOUS | Status: DC

## 2020-06-20 MED ORDER — HYDRALAZINE HCL 20 MG/ML IJ SOLN: 10.0000 mg | INTRAMUSCULAR | Status: AC | PRN

## 2020-06-20 MED ORDER — CARVEDILOL 3.125 MG PO TABS
3.1250 mg | ORAL_TABLET | Freq: Two times a day (BID) | ORAL | Status: DC
Start: 2020-06-21 — End: 2020-06-22
  Administered 2020-06-21 – 2020-06-22 (×3): 3.125 mg via ORAL
  Filled 2020-06-20 (×3): qty 1

## 2020-06-20 MED ORDER — ASPIRIN 81 MG PO CHEW: 81.0000 mg | CHEWABLE_TABLET | ORAL | Status: DC

## 2020-06-20 MED ORDER — PSYLLIUM 95 % PO PACK
1.0000 | PACK | Freq: Every day | ORAL | Status: DC
  Filled 2020-06-20 (×2): qty 1

## 2020-06-20 MED ORDER — ONDANSETRON HCL 4 MG/2ML IJ SOLN: 4.0000 mg | Freq: Four times a day (QID) | INTRAMUSCULAR | Status: DC | PRN

## 2020-06-20 MED ORDER — VERAPAMIL HCL 2.5 MG/ML IV SOLN
INTRAVENOUS | Status: AC
  Filled 2020-06-20: qty 2

## 2020-06-20 MED ORDER — LABETALOL HCL 5 MG/ML IV SOLN: 10.0000 mg | INTRAVENOUS | Status: AC | PRN

## 2020-06-20 MED ORDER — SODIUM CHLORIDE 0.9 % IV SOLN: 250.0000 mL | INTRAVENOUS | Status: DC | PRN

## 2020-06-20 MED ORDER — LOSARTAN POTASSIUM 50 MG PO TABS
25.0000 mg | ORAL_TABLET | Freq: Every day | ORAL | Status: DC
  Administered 2020-06-21 – 2020-06-22 (×2): 25 mg via ORAL
  Filled 2020-06-20 (×2): qty 1

## 2020-06-20 MED ORDER — ROPINIROLE HCL 0.25 MG PO TABS
0.5000 mg | ORAL_TABLET | Freq: Every day | ORAL | Status: DC
  Administered 2020-06-21: 0.5 mg via ORAL
  Filled 2020-06-20 (×2): qty 2

## 2020-06-20 MED ORDER — PERFLUTREN LIPID MICROSPHERE
1.0000 mL | INTRAVENOUS | Status: AC | PRN
  Administered 2020-06-20: 2 mL via INTRAVENOUS
  Filled 2020-06-20: qty 10

## 2020-06-20 MED ORDER — PANTOPRAZOLE SODIUM 40 MG PO TBEC
40.0000 mg | DELAYED_RELEASE_TABLET | Freq: Two times a day (BID) | ORAL | Status: DC
  Administered 2020-06-21 (×2): 40 mg via ORAL
  Filled 2020-06-20 (×2): qty 1

## 2020-06-20 MED ORDER — GABAPENTIN 400 MG PO CAPS
800.0000 mg | ORAL_CAPSULE | Freq: Three times a day (TID) | ORAL | Status: DC
  Administered 2020-06-21 (×3): 800 mg via ORAL
  Filled 2020-06-20 (×3): qty 2

## 2020-06-20 MED ORDER — ENOXAPARIN SODIUM 40 MG/0.4ML ~~LOC~~ SOLN
40.0000 mg | SUBCUTANEOUS | Status: DC
  Administered 2020-06-21 – 2020-06-22 (×2): 40 mg via SUBCUTANEOUS
  Filled 2020-06-20 (×2): qty 0.4

## 2020-06-20 MED ORDER — FENTANYL CITRATE (PF) 100 MCG/2ML IJ SOLN
INTRAMUSCULAR | Status: AC
  Filled 2020-06-20: qty 2

## 2020-06-20 MED ORDER — TICAGRELOR 90 MG PO TABS
ORAL_TABLET | ORAL | Status: DC | PRN
  Administered 2020-06-20: 180 mg via ORAL

## 2020-06-20 MED ORDER — ALBUTEROL SULFATE HFA 108 (90 BASE) MCG/ACT IN AERS
2.0000 | INHALATION_SPRAY | RESPIRATORY_TRACT | Status: DC | PRN
  Filled 2020-06-20: qty 6.7

## 2020-06-20 MED ORDER — SODIUM CHLORIDE 0.9 % IV SOLN: INTRAVENOUS | Status: DC

## 2020-06-20 MED ORDER — SODIUM CHLORIDE 0.9% FLUSH
3.0000 mL | Freq: Two times a day (BID) | INTRAVENOUS | Status: DC
  Administered 2020-06-20: 3 mL via INTRAVENOUS

## 2020-06-20 MED ORDER — FINASTERIDE 5 MG PO TABS
5.0000 mg | ORAL_TABLET | Freq: Every day | ORAL | Status: DC
  Administered 2020-06-21: 5 mg via ORAL
  Filled 2020-06-20: qty 1

## 2020-06-20 MED ORDER — SPIRONOLACTONE 25 MG PO TABS
12.5000 mg | ORAL_TABLET | Freq: Every day | ORAL | Status: DC
  Administered 2020-06-21: 12.5 mg via ORAL
  Filled 2020-06-20 (×2): qty 0.5

## 2020-06-20 MED ORDER — NITROGLYCERIN 1 MG/10 ML FOR IR/CATH LAB
INTRA_ARTERIAL | Status: DC | PRN
  Administered 2020-06-20 (×2): 200 ug

## 2020-06-20 MED ORDER — MIDAZOLAM HCL 2 MG/2ML IJ SOLN
INTRAMUSCULAR | Status: AC
  Filled 2020-06-20: qty 2

## 2020-06-20 MED ORDER — DOCUSATE SODIUM 100 MG PO CAPS
100.0000 mg | ORAL_CAPSULE | Freq: Two times a day (BID) | ORAL | Status: DC
  Administered 2020-06-20 – 2020-06-21 (×3): 100 mg via ORAL
  Filled 2020-06-20 (×3): qty 1

## 2020-06-20 MED ORDER — CHLORHEXIDINE GLUCONATE CLOTH 2 % EX PADS
6.0000 | MEDICATED_PAD | Freq: Every day | CUTANEOUS | Status: DC
  Administered 2020-06-20 – 2020-06-21 (×2): 6 via TOPICAL

## 2020-06-20 MED ORDER — HEPARIN SODIUM (PORCINE) 1000 UNIT/ML IJ SOLN
INTRAMUSCULAR | Status: DC | PRN
  Administered 2020-06-20: 3000 [IU] via INTRAVENOUS
  Administered 2020-06-20: 4000 [IU] via INTRAVENOUS

## 2020-06-20 MED ORDER — NITROGLYCERIN 0.4 MG SL SUBL
0.4000 mg | SUBLINGUAL_TABLET | Freq: Once | SUBLINGUAL | Status: DC
  Administered 2020-06-20: 0.4 mg via SUBLINGUAL

## 2020-06-20 MED ORDER — ICOSAPENT ETHYL 1 G PO CAPS
2.0000 g | ORAL_CAPSULE | Freq: Two times a day (BID) | ORAL | Status: DC
  Administered 2020-06-21 – 2020-06-22 (×3): 2 g via ORAL
  Filled 2020-06-20 (×4): qty 2

## 2020-06-20 MED ORDER — TIROFIBAN (AGGRASTAT) BOLUS VIA INFUSION
INTRAVENOUS | Status: DC | PRN
  Administered 2020-06-20: 1985 ug via INTRAVENOUS

## 2020-06-20 MED ORDER — NITROGLYCERIN 0.4 MG SL SUBL: 0.4000 mg | SUBLINGUAL_TABLET | SUBLINGUAL | Status: DC | PRN

## 2020-06-20 MED ORDER — HEPARIN SODIUM (PORCINE) 5000 UNIT/ML IJ SOLN
4000.0000 [IU] | Freq: Once | INTRAMUSCULAR | Status: AC
  Administered 2020-06-20: 4000 [IU] via INTRAVENOUS

## 2020-06-20 MED ORDER — NITROGLYCERIN 2 % TD OINT
1.0000 [in_us] | TOPICAL_OINTMENT | Freq: Once | TRANSDERMAL | Status: AC
  Administered 2020-06-20: 1 [in_us] via TOPICAL

## 2020-06-20 MED ORDER — ASPIRIN 81 MG PO CHEW
162.0000 mg | CHEWABLE_TABLET | Freq: Once | ORAL | Status: AC
  Administered 2020-06-20: 162 mg via ORAL

## 2020-06-20 MED ORDER — SODIUM CHLORIDE 0.9 % IV SOLN
250.0000 mL | INTRAVENOUS | Status: DC | PRN
Start: 2020-06-20 — End: 2020-06-22

## 2020-06-20 MED ORDER — TIROFIBAN HCL IV 12.5 MG/250 ML
INTRAVENOUS | Status: AC | PRN
  Administered 2020-06-20: 0.15 ug/kg/min via INTRAVENOUS

## 2020-06-20 MED ORDER — TIROFIBAN HCL IV 12.5 MG/250 ML
INTRAVENOUS | Status: AC
  Filled 2020-06-20: qty 250

## 2020-06-20 MED ORDER — TICAGRELOR 90 MG PO TABS
90.0000 mg | ORAL_TABLET | Freq: Two times a day (BID) | ORAL | Status: DC
  Administered 2020-06-21 – 2020-06-22 (×3): 90 mg via ORAL
  Filled 2020-06-20 (×3): qty 1

## 2020-06-20 MED ORDER — VITAMIN B-12 1000 MCG PO TABS
1000.0000 ug | ORAL_TABLET | Freq: Every day | ORAL | Status: DC
  Administered 2020-06-21: 1000 ug via ORAL
  Filled 2020-06-20: qty 1

## 2020-06-20 MED ORDER — ASPIRIN EC 81 MG PO TBEC
81.0000 mg | DELAYED_RELEASE_TABLET | Freq: Every day | ORAL | Status: DC
  Administered 2020-06-21 – 2020-06-22 (×2): 81 mg via ORAL
  Filled 2020-06-20 (×2): qty 1

## 2020-06-20 MED ORDER — ROSUVASTATIN CALCIUM 10 MG PO TABS
40.0000 mg | ORAL_TABLET | Freq: Every day | ORAL | Status: DC
  Administered 2020-06-21 – 2020-06-22 (×2): 40 mg via ORAL
  Filled 2020-06-20 (×3): qty 4

## 2020-06-20 MED ORDER — VERAPAMIL HCL 2.5 MG/ML IV SOLN
INTRAVENOUS | Status: DC | PRN
  Administered 2020-06-20: 2.5 mg via INTRACORONARY
  Administered 2020-06-20: 1 mg via INTRAVENOUS

## 2020-06-20 MED ORDER — CITALOPRAM HYDROBROMIDE 20 MG PO TABS
20.0000 mg | ORAL_TABLET | Freq: Every day | ORAL | Status: DC
  Administered 2020-06-21: 20 mg via ORAL
  Filled 2020-06-20: qty 1

## 2020-06-20 MED ORDER — NITROGLYCERIN 1 MG/10 ML FOR IR/CATH LAB
INTRA_ARTERIAL | Status: AC
  Filled 2020-06-20: qty 10

## 2020-06-20 MED ORDER — TICAGRELOR 90 MG PO TABS
ORAL_TABLET | ORAL | Status: AC
  Filled 2020-06-20: qty 2

## 2020-06-20 MED ORDER — TIROFIBAN HCL IN NACL 5-0.9 MG/100ML-% IV SOLN
0.1500 ug/kg/min | INTRAVENOUS | Status: AC
  Administered 2020-06-20: 0.15 ug/kg/min via INTRAVENOUS
  Filled 2020-06-20: qty 100

## 2020-06-20 MED ORDER — MIDAZOLAM HCL 2 MG/2ML IJ SOLN
INTRAMUSCULAR | Status: DC | PRN
  Administered 2020-06-20: 1 mg via INTRAVENOUS

## 2020-06-20 MED ORDER — HEPARIN SODIUM (PORCINE) 1000 UNIT/ML IJ SOLN
INTRAMUSCULAR | Status: AC
  Filled 2020-06-20: qty 1

## 2020-06-20 MED ORDER — FENTANYL CITRATE (PF) 100 MCG/2ML IJ SOLN
INTRAMUSCULAR | Status: DC | PRN
  Administered 2020-06-20: 25 ug via INTRAVENOUS

## 2020-06-20 SURGICAL SUPPLY — 22 items
BALLN EUPHORA RX 2.5X12 (BALLOONS) ×2
BALLN TREK RX 3.0X15 (BALLOONS) ×2
BALLN ~~LOC~~ TREK RX 2.75X12 (BALLOONS) ×2
BALLOON EUPHORA RX 2.5X12 (BALLOONS) ×1 IMPLANT
BALLOON TREK RX 3.0X15 (BALLOONS) ×1 IMPLANT
BALLOON ~~LOC~~ TREK RX 2.75X12 (BALLOONS) ×1 IMPLANT
CATH INFINITI 5 FR IM (CATHETERS) ×2 IMPLANT
CATH INFINITI 5 FR JL3.5 (CATHETERS) ×2 IMPLANT
CATH INFINITI 5 FR MPA2 (CATHETERS) ×2 IMPLANT
CATH INFINITI 5FR JL4 (CATHETERS) ×2 IMPLANT
CATH VISTA GUIDE 6FR MPA1 (CATHETERS) ×2 IMPLANT
DEVICE RAD TR BAND REGULAR (VASCULAR PRODUCTS) ×2 IMPLANT
GLIDESHEATH SLEND SS 6F .021 (SHEATH) ×2 IMPLANT
GUIDEWIRE INQWIRE 1.5J.035X260 (WIRE) ×1 IMPLANT
INQWIRE 1.5J .035X260CM (WIRE) ×2
KIT ENCORE 26 ADVANTAGE (KITS) ×2 IMPLANT
KIT MANI 3VAL PERCEP (MISCELLANEOUS) ×2 IMPLANT
PACK CARDIAC CATH (CUSTOM PROCEDURE TRAY) ×2 IMPLANT
STENT RESOLUTE ONYX 2.5X18 (Permanent Stent) ×2 IMPLANT
STENT RESOLUTE ONYX 4.0X26 (Permanent Stent) ×2 IMPLANT
STENT RESOLUTE ONYX 4.0X38 (Permanent Stent) ×2 IMPLANT
WIRE RUNTHROUGH .014X180CM (WIRE) ×2 IMPLANT

## 2020-06-20 NOTE — Progress Notes (Signed)
Triponin 667

## 2020-06-20 NOTE — ED Notes (Signed)
Cardiologist will let this nurse know when cath lab ready for pt.

## 2020-06-20 NOTE — ED Notes (Signed)
Pt in bed in NAD. No diaphoresis or SOB noted. VS stable. Pt states that CP is is unchanged.

## 2020-06-20 NOTE — Brief Op Note (Signed)
BRIEF CARDIAC CATHETERIZATION NOTE  DATE: 06/20/2020  TIME: 5:24 PM  PATIENT:  Willie Clark  70 y.o. male  PRE-OPERATIVE DIAGNOSIS:  Acute coronary syndrome  POST-OPERATIVE DIAGNOSIS:  Same  PROCEDURE:  Procedure(s): LEFT HEART CATH AND CORS/GRAFTS ANGIOGRAPHY (N/A)  SURGEON:  Surgeon(s) and Role:    * End, Harrell Gave, MD - Primary  FINDINGS: 1. Severe native CAD, including CTO of ostial LAD and RCA. 2. Patent LCx stent with mild to moderate ISR. 3. Widely patent LIMA-LAD. 4. Patent SVG-OM1 with 60-70% ostial/proximal graft stenosis. 5. Subtotally occluded SVG-RCA with long segment of proximal graft disease of up to 80% and 99% stenosis at the distal anastomosis with TIMI-1 flow. 6. Moderately reduced LVEF with inferior akinesis.  Normal LVEDP. 7. Successful PCI to SVG-RCA with overlapping Resolute Onyx 4.0 x 26 mm and 4.0 x 38 mm proximal and 2.5 x 18 mm distal (extending into rPDA) drug-eluting stents.  RECOMMENDATIONS: 1. Continue tirofiban infusion x 6 hours. 2. DAPT with aspirin and tigagrelor for at least 12 months. 3. Aggressive secondary prevention. 4. If patient has recurrent chest pain, consider PCI to SVG-OM. 5. Follow-up echocardiogram.  Nelva Bush, MD [place]

## 2020-06-20 NOTE — ED Notes (Signed)
X-ray at bedside

## 2020-06-20 NOTE — Progress Notes (Signed)
Cardiology Office Note   Date:  06/20/2020   ID:  Willie Clark, DOB [birth year], MRN [id number]  PCP:  [name], MD  Cardiologist:   [name], MD   Chief Complaint  Patient presents with  . 4 month follow up     Patient c/o chest pain, shortness of breath and left arm pain, on a scale from 1-10 being a 10, took 4 NTG today with relief for about 10 minutes but started back.       History of Present Illness: Willie Clark is a 70 y.o. male who presents for regarding coronary artery disease and chronic systolic heart failure.  He has known history of coronary artery disease. He is status post coronary artery bypass graft surgery in 1993 and redo in 2007.   He suffers from fibromyalgia and chronic pain.  He had GI symptoms in 2015 due to documented gastric ulcer. He had neck surgery in 6294 without complications. He has known history of chronic neck pain with 2 previous cervical spine surgeries.    He was hospitalized in August of 2021with chest pain.  Echocardiogram showed an EF of 40 to 45%.  Cardiac catheterization showed severe underlying three-vessel coronary arteries with patent grafts and no significant change since 2017.  The patient antihypertensive medications were switched to losartan and carvedilol.  Rosuvastatin was increased to 40 mg daily. Losartan was subsequently switched to [facility].  However, the patient had allergic reaction with rash and itching that had to be treated in the emergency room.  Spironolactone was subsequently added.  He presents to the clinic today with chest pain.  Chest pain started at 9 AM and described as substernal tightness very similar to his previous myocardial infarction.  Initially the chest pain was 10 out of 10.  He was given 1 sublingual nitroglycerin and it improved to 7 out of 10.  EKG showed minor inferior ST elevation with reciprocal changes.   Past Medical History:  Diagnosis Date  . Anxiety   . Arthritis   . Barrett's  esophagus   . Bowel obstruction ([place]) 02/2009   small bowel  . Chronic chest pain   . Coronary artery disease    a. 1993 CABG; b. 2007 Redo CABG; c. 09/2015 Cath: LM mild dzs, LAD 100ost, RI mild dzs, LCX 60p, OM1 100, RCA 100ost, VG->OM2 min irregs, LIMA->LAD nl, VG->RPDA 30ost, 20d, nl EF->Med rx.  . Depression   . Diverticulitis   . Diverticulosis   . Erectile dysfunction   . Fibromyalgia   . Gastric ulcer   . Gastritis   . GERD (gastroesophageal reflux disease)   . Headache   . Hemorrhoids   . HFrEF (heart failure with reduced ejection fraction) ([place])    a. 09/2016 Echo: EF 55-60%, no rwma, triv AI, mild MR, mildly dil LA. -> as of April 2020: EF 40 to 45%.  Septal dyssynergy/hypokinesis due to postop state but otherwise unable to assess wall motion [name] due to poor study.  . History of myocardial infarct at age less than 56 years   . Hyperlipidemia   . Hypertension   . Kidney stones    going to [facility]  June 26 to see kidney specialist  . Lightheadedness   . Mass on back   . Nephrolithiasis   . Orthostatic hypotension    a. Improved after discontinuation of metoprolol.  . OSA (obstructive sleep apnea)    a. On CPAP.  [name] Pulmonary embolism ([place]) 01/2006  Post-op/treated  . Skin lesions, generalized   . Stroke [facility]) 1993   right side weakness no blood thinners   on Aspirin  . Syncope and collapse   . Urticaria     Past Surgical History:  Procedure Laterality Date  . BACK SURGERY    . bowel obstruction  01/2009  . CARDIAC CATHETERIZATION  11/2006   patent grafts. Significant OM3 disease and occluded diagonals.  [name] CARDIAC CATHETERIZATION  06/2010   patent grafts. significant ISR in proximal LCX but OM2 is bypassed and gives retrograde flow to OM3.   [name] CARDIAC CATHETERIZATION  06/2013   [facility]: Patent grafts with 60% proximal in-stent restenosis in the left circumflex with FFR of 0.85  . CARDIAC CATHETERIZATION Left 09/25/2015   Procedure: Left Heart Cath and  Cors/Grafts Angiography;  Surgeon: Wellington Hampshire, MD;  Location: [place] CV LAB;  Service: Cardiovascular;  Laterality: Left;  . CERVICAL FUSION    . CHOLECYSTECTOMY    . COLON SURGERY  01/2009   BOWEL RESECTION DUE TO SMALL BOWEL OBSTRUCTION  . COLONOSCOPY    . COLONOSCOPY WITH PROPOFOL N/A 10/28/2017   Procedure: COLONOSCOPY WITH PROPOFOL;  Surgeon: Lollie Sails, MD;  Location: [facility] ENDOSCOPY;  Service: Endoscopy;  Laterality: N/A;  . CORONARY ANGIOPLASTY  2006  . CORONARY ARTERY BYPASS GRAFT  1993/01/2006   redo at [facility]. LIMA to LAD, SVG to OM2 and SVG to RPDA  . CYSTOSCOPY WITH STENT PLACEMENT Bilateral 09/24/2015   Procedure: CYSTOSCOPY WITH BILATERAL RETROGRADES, BILATERAL STENT PLACEMENT;  Surgeon: Festus Aloe, MD;  Location: [facility] ORS;  Service: Urology;  Laterality: Bilateral;  . ESOPHAGOGASTRODUODENOSCOPY N/A 10/25/2014   Procedure: ESOPHAGOGASTRODUODENOSCOPY (EGD);  Surgeon: Lollie Sails, MD;  Location: [facility] ENDOSCOPY;  Service: Endoscopy;  Laterality: N/A;  . ESOPHAGOGASTRODUODENOSCOPY (EGD) WITH PROPOFOL N/A 02/03/2017   Procedure: ESOPHAGOGASTRODUODENOSCOPY (EGD) WITH PROPOFOL;  Surgeon: Lollie Sails, MD;  Location: [facility] ENDOSCOPY;  Service: Endoscopy;  Laterality: N/A;  . ESOPHAGOGASTRODUODENOSCOPY (EGD) WITH PROPOFOL N/A 04/18/2017   Procedure: ESOPHAGOGASTRODUODENOSCOPY (EGD) WITH PROPOFOL;  Surgeon: Lollie Sails, MD;  Location: [facility] ENDOSCOPY;  Service: Endoscopy;  Laterality: N/A;  . EYE SURGERY Bilateral    Cataract Extraction with IOL  . HERNIA REPAIR    . INGUINAL HERNIA REPAIR Bilateral 01/23/2016   Procedure: HERNIA REPAIR INGUINAL ADULT BILATERAL;  Surgeon: Leonie Green, MD;  Location: [facility] ORS;  Service: General;  Laterality: Bilateral;  . LEFT HEART CATH AND CORS/GRAFTS ANGIOGRAPHY N/A 12/13/2019   Procedure: LEFT HEART CATH AND CORS/GRAFTS ANGIOGRAPHY;  Surgeon: Wellington Hampshire, MD;  Location: [place] CV LAB;  Service:  Cardiovascular;  Laterality: N/A;  . LUMBAR LAMINECTOMY/DECOMPRESSION MICRODISCECTOMY Left 10/01/2016   Procedure: Left Lumbar four-five Laminotomy for resection of synovial cyst;  Surgeon: Erline Levine, MD;  Location: [place];  Service: Neurosurgery;  Laterality: Left;  left  . NECK SURGERY  06/2009  . SHOULDER SURGERY  2010     Current Outpatient Medications  Medication Sig Dispense Refill  . albuterol (PROVENTIL HFA;VENTOLIN HFA) 108 (90 Base) MCG/ACT inhaler Inhale into the lungs every 4 (four) hours as needed for wheezing or shortness of breath.    [name] aspirin EC 81 MG tablet Take 81 mg by mouth daily.    . carvedilol (COREG) 3.125 MG tablet Take 1 tablet (3.125 mg total) by mouth 2 (two) times daily with a meal. 60 tablet 0  . citalopram (CELEXA) 20 MG tablet Take 20 mg by mouth daily.     [name]  dicyclomine (BENTYL) 10 MG capsule Take 10 mg by mouth 3 (three) times daily.    [name] docusate sodium (COLACE) 100 MG capsule Take 100 mg by mouth 2 (two) times daily.    [name] EPINEPHrine 0.3 mg/0.3 mL IJ SOAJ injection Inject 0.3 mg into the muscle as needed.     Eduard Roux 140 MG/ML SOAJ Inject 140 mg into the skin every 28 (twenty-eight) days.    . fexofenadine (ALLEGRA) 180 MG tablet Take 180 mg by mouth daily.    [name] FIBER PO Take 1 tablet by mouth daily.     . finasteride (PROSCAR) 5 MG tablet Take 5 mg by mouth daily.    . furosemide (LASIX) 40 MG tablet TAKE 1 TABLET (40 MG TOTAL) BY MOUTH DAILY AS NEEDED FOR FLUID OR EDEMA. 90 tablet 0  . gabapentin (NEURONTIN) 800 MG tablet Take 1 tablet by mouth 3 (three) times daily.    [name] icosapent Ethyl (VASCEPA) 1 g capsule Take 2 capsules (2 g total) by mouth 2 (two) times daily. 360 capsule 1  . losartan (COZAAR) 25 MG tablet TAKE 1 TABLET EVERY DAY 90 tablet 0  . Methylcellulose, Laxative, 500 MG TABS Take 1 tablet by mouth daily.    . Multiple Vitamins-Minerals (HAIR/SKIN/NAILS PO) Take 1 tablet by mouth 3 (three) times daily.     . nitroGLYCERIN  (NITROSTAT) 0.4 MG SL tablet Place 0.4 mg under the tongue every 5 (five) minutes as needed for chest pain.     . pantoprazole (PROTONIX) 40 MG tablet Take 40 mg by mouth 2 (two) times daily.     . potassium chloride SA (K-DUR,KLOR-CON) 20 MEQ tablet Take 20 mEq by mouth daily.    . psyllium (REGULOID) 0.52 g capsule Take 0.52 g by mouth daily.     [name] rOPINIRole (REQUIP) 0.5 MG tablet Take 1 tablet by mouth at bedtime.    . rosuvastatin (CRESTOR) 40 MG tablet Take 1 tablet (40 mg total) by mouth daily. 90 tablet 1  . spironolactone (ALDACTONE) 25 MG tablet Take 0.5 tablets (12.5 mg total) by mouth daily. 45 tablet 1  . triamcinolone ointment (KENALOG) 0.1 % Apply 1 application topically 2 (two) times daily.    . vitamin B-12 (CYANOCOBALAMIN) 1000 MCG tablet Take 1,000 mcg by mouth daily.     No current facility-administered medications for this visit.    Allergies:   Amitriptyline, Cyclobenzaprine, Dicyclomine, Sacubitril-valsartan, Acetaminophen, Amitriptyline hcl, Bacon flavor, Hydrocodone, Fish oil, Lidocaine, Omega-3 fatty acids-vitamin e, Other, Oxycodone-acetaminophen, and Sulfa antibiotics    Social History:  The patient  reports that he quit smoking about 28 years ago. His smoking use included cigarettes. He smoked 2.50 packs per day. He has never used smokeless tobacco. He reports previous alcohol use. He reports that he does not use drugs.   Family History:  The patient's family history includes Heart attack in his father; Heart disease (age of onset: 46) in his father.    ROS:  Please see the history of present illness.   Otherwise, review of systems are positive for none.   All other systems are reviewed and negative.    PHYSICAL EXAM: VS:  BP 140/80 (BP Location: Left Arm, Patient Position: Sitting, Cuff Size: Normal)   Pulse 76   Ht 5\' 9"  (1.753 m)   Wt 197 lb 6 oz (89.5 kg)   SpO2 99%   BMI 29.15 kg/m  , BMI Body mass index is 29.15 kg/m. GEN: Well nourished, well  developed, in  no acute distress  HEENT: normal  Neck: no JVD, carotid bruits, or masses Cardiac: RRR; no murmurs, rubs, or gallops,no edema  Respiratory:  clear to auscultation bilaterally, normal work of breathing GI: soft, nontender, nondistended, + BS MS: no deformity or atrophy  Skin: warm and dry, no rash Neuro:  Strength and sensation are intact Psych: euthymic mood, full affect   EKG:  EKG is  ordered today. Sinus rhythm with PACs.  1 mm of inferior ST elevation with reciprocal ST depression in 1 and aVL as well as V4 through V6.   Recent Labs: 12/11/2019: Hemoglobin 14.4; Platelets 167 12/12/2019: Magnesium 2.7 02/07/2020: ALT 28; BUN 10; Creatinine, Ser 0.81; Potassium 3.8; Sodium 139    Lipid Panel    Component Value Date/Time   CHOL 165 02/07/2020 0658   TRIG 317 (H) 02/07/2020 0658   HDL 41 02/07/2020 0658   CHOLHDL 4.0 02/07/2020 0658   VLDL 63 (H) 02/07/2020 0658   LDLCALC 61 02/07/2020 0658      Wt Readings from Last 3 Encounters:  06/20/20 197 lb 6 oz (89.5 kg)  02/24/20 202 lb (91.6 kg)  01/31/20 199 lb 2 oz (90.3 kg)         ASSESSMENT AND PLAN:  1.  Coronary artery disease involving native coronary arteries with inferior ST elevation myocardial infarction: The patient is still having chest pain in spite of nitroglycerin EKG is suspicious for inferior STEMI. Based on this, the patient will be transferred to the ED for STEMI activation.  I discussed with Dr. Quentin Cornwall in the ED as well as with Dr. Saunders Revel who is on-call for interventional cardiology.  2.  Chronic systolic heart failure: EF of 35 to 40%: He will require repeat echocardiogram.  3. Essential hypertension: Blood pressure is controlled.  4. Hyperlipidemia: Continue high-dose rosuvastatin.  Recent lipid profile showed an LDL of 61.  However, triglyceride was elevated at 317.  Since then, Vascepa was added.  He pays about $100 per month for this and might not be able to afford it long-term but  he wants to wait and see the results with it.  5. Mild carotid disease: Previous carotid Doppler showed less than 40% stenosis bilaterally. No need to repeat.  6.  Small abdominal aortic aneurysm: This was noted on ultrasound yesterday measuring 3 cm.  Recommend repeat ultrasound in 2 years .    Disposition: The patient was transferred to the ED for management of inferior STEMI.   Signed,  [name], MD  06/20/2020 2:23 PM    [place]

## 2020-06-20 NOTE — ED Notes (Signed)
Wife at bedside.

## 2020-06-20 NOTE — Interval H&P Note (Signed)
History and Physical Interval Note:  06/20/2020 3:39 PM  Willie Clark  has presented today for surgery, with the diagnosis of acute coronary syndrome.  The various methods of treatment have been discussed with the patient and family. After consideration of risks, benefits and other options for treatment, the patient has consented to  Procedure(s): LEFT HEART CATH AND CORS/GRAFTS ANGIOGRAPHY (N/A) as a surgical intervention.  The patient's history has been reviewed, patient examined, no change in status, stable for surgery.  I have reviewed the patient's chart and labs.  Questions were answered to the patient's satisfaction.    Cath Lab Visit (complete for each Cath Lab visit)  Clinical Evaluation Leading to the Procedure:   ACS: Yes.    Non-ACS:  N/A  [place] End

## 2020-06-20 NOTE — ED Notes (Signed)
This nurse to cath lab with pt on monitor and zoll pads.

## 2020-06-20 NOTE — ED Provider Notes (Signed)
[facility] Emergency Department Provider Note    Event Date/Time   First MD Initiated Contact with Patient 06/20/20 1450     (approximate)  I have reviewed the triage vital signs and the nursing notes.   HISTORY  Chief Complaint Chest Pain    HPI Willie Clark is a 70 y.o. male below listed past medical history with known CAD status post past CABG presents to the ER for evaluation of chest pain and pressure associated with generalized malaise that started this morning.  States that he took 4 nitroglycerin with improvement in his pain.  He did take an 81 mg aspirin.  He went to cardiology clinic  and was given additional aspirin and nitro with some additional improvement in his pain but he is still having discomfort EKG showed ST changes concerning for inferior STEMI.  Patient was taken immediately over to the ER.  States his pain was improving.   Past Medical History:  Diagnosis Date  . Anxiety   . Arthritis   . Barrett's esophagus   . Bowel obstruction ([place]) 02/2009   small bowel  . Chronic chest pain   . Coronary artery disease    a. 1993 CABG; b. 2007 Redo CABG; c. 09/2015 Cath: LM mild dzs, LAD 100ost, RI mild dzs, LCX 60p, OM1 100, RCA 100ost, VG->OM2 min irregs, LIMA->LAD nl, VG->RPDA 30ost, 20d, nl EF->Med rx.  . Depression   . Diverticulitis   . Diverticulosis   . Erectile dysfunction   . Fibromyalgia   . Gastric ulcer   . Gastritis   . GERD (gastroesophageal reflux disease)   . Headache   . Hemorrhoids   . HFrEF (heart failure with reduced ejection fraction) ([place])    a. 09/2016 Echo: EF 55-60%, no rwma, triv AI, mild MR, mildly dil LA. -> as of April 2020: EF 40 to 45%.  Septal dyssynergy/hypokinesis due to postop state but otherwise unable to assess wall motion [name] due to poor study.  . History of myocardial infarct at age less than 3 years   . Hyperlipidemia   . Hypertension   . Kidney stones    going to [facility]  June 26 to  see kidney specialist  . Lightheadedness   . Mass on back   . Nephrolithiasis   . Orthostatic hypotension    a. Improved after discontinuation of metoprolol.  . OSA (obstructive sleep apnea)    a. On CPAP.  [name] Pulmonary embolism ([name]) 01/2006   Post-op/treated  . Skin lesions, generalized   . Stroke [facility]) 1993   right side weakness no blood thinners   on Aspirin  . Syncope and collapse   . Urticaria    Family History  Problem Relation Age of Onset  . Heart disease Father 34  . Heart attack Father    Past Surgical History:  Procedure Laterality Date  . BACK SURGERY    . bowel obstruction  01/2009  . CARDIAC CATHETERIZATION  11/2006   patent grafts. Significant OM3 disease and occluded diagonals.  [name] CARDIAC CATHETERIZATION  06/2010   patent grafts. significant ISR in proximal LCX but OM2 is bypassed and gives retrograde flow to OM3.   [name] CARDIAC CATHETERIZATION  06/2013   [facility]: Patent grafts with 60% proximal in-stent restenosis in the left circumflex with FFR of 0.85  . CARDIAC CATHETERIZATION Left 09/25/2015   Procedure: Left Heart Cath and Cors/Grafts Angiography;  Surgeon: Wellington Hampshire, MD;  Location: [place] CV LAB;  Service: Cardiovascular;  Laterality: Left;  . CERVICAL FUSION    . CHOLECYSTECTOMY    . COLON SURGERY  01/2009   BOWEL RESECTION DUE TO SMALL BOWEL OBSTRUCTION  . COLONOSCOPY    . COLONOSCOPY WITH PROPOFOL N/A 10/28/2017   Procedure: COLONOSCOPY WITH PROPOFOL;  Surgeon: Lollie Sails, MD;  Location: [facility] END[facility]COPY;  Service: Endoscopy;  Laterality: N/A;  . CORONARY ANGIOPLASTY  2006  . CORONARY ARTERY BYPASS GRAFT  1993/01/2006   redo at [facility]. LIMA to LAD, SVG to OM2 and SVG to RPDA  . CYSTOSCOPY WITH STENT PLACEMENT Bilateral 09/24/2015   Procedure: CYSTOSCOPY WITH BILATERAL RETROGRADES, BILATERAL STENT PLACEMENT;  Surgeon: Festus Aloe, MD;  Location: [facility] ORS;  Service: Urology;  Laterality: Bilateral;  . ESOPHAGOGASTRODUODENOSCOPY N/A  10/25/2014   Procedure: ESOPHAGOGASTRODUODENOSCOPY (EGD);  Surgeon: Lollie Sails, MD;  Location[facility] ENDOSCOPY;  Service: Endoscopy;  Laterality: N/A;  . ESOPHAGOGASTRODUODENOSCOPY (EGD) WITH PROPOFOL N/A 02/03/2017   Procedure: ESOPHAGOGASTRODUODENOSCOPY (EGD) WITH PROPOFOL;  Surgeon: Lollie Sails, MD;  Location: [facility] ENDOSCOPY;  Service: Endoscopy;  Laterality: N/A;  . ESOPHAGOGASTRODUODENOSCOPY (EGD) WITH PROPOFOL N/A 04/18/2017   Procedure: ESOPHAGOGASTRODUODENOSCOPY (EGD) WITH PROPOFOL;  Surgeon: Lollie Sails, MD;  Location: [facility] ENDOSCOPY;  Service: Endoscopy;  Laterality: N/A;  . EYE SURGERY Bilateral    Cataract Extraction with IOL  . HERNIA REPAIR    . INGUINAL HERNIA REPAIR Bilateral 01/23/2016   Procedure: HERNIA REPAIR INGUINAL ADULT BILATERAL;  Surgeon: Leonie Green, MD;  Location: [facility] ORS;  Service: General;  Laterality: Bilateral;  . LEFT HEART CATH AND CORS/GRAFTS ANGIOGRAPHY N/A 12/13/2019   Procedure: LEFT HEART CATH AND CORS/GRAFTS ANGIOGRAPHY;  Surgeon: Wellington Hampshire, MD;  Location: [place] CV LAB;  Service: Cardiovascular;  Laterality: N/A;  . LUMBAR LAMINECTOMY/DECOMPRESSION MICRODISCECTOMY Left 10/01/2016   Procedure: Left Lumbar four-five Laminotomy for resection of synovial cyst;  Surgeon: Erline Levine, MD;  Location: [place];  Service: Neurosurgery;  Laterality: Left;  left  . NECK SURGERY  06/2009  . SHOULDER SURGERY  2010   Patient Active Problem List   Diagnosis Date Noted  . Unstable angina ([place]) 12/12/2019  . Hypokalemia 12/12/2019  . Depression 12/12/2019  . 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  . Chest pain 07/31/2018  . Synovial cyst of lumbar facet joint 10/01/2016  . Kidney stones   . Ureteral obstruction   . Obstructive uropathy 09/24/2015  . Abnormal stress test   . 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 Chronic combined systolic and diastolic congestive heart failure ([place])   . Essential hypertension    . Preoperative cardiovascular examination   . Bilateral leg edema 01/10/2015  . TIA (transient ischemic attack) 08/13/2014  . Intermittent claudication ([place]) 06/07/2013  . Abdominal pain 05/16/2012  . Dyspnea 11/21/2011  . Sinus bradycardia 07/12/2010  . ED (erectile dysfunction) 07/12/2010  . Coronary artery disease involving native coronary artery of native heart with angina pectoris ([place])   . Hypertension   . Hyperlipidemia       Prior to Admission medications   Medication Sig Start Date End Date Taking? Authorizing Provider  albuterol (PROVENTIL HFA;VENTOLIN HFA) 108 (90 Base) MCG/ACT inhaler Inhale into the lungs every 4 (four) hours as needed for wheezing or shortness of breath.    [provider]  aspirin EC 81 MG tablet Take 81 mg by mouth daily.    [provider]  carvedilol (COREG) 3.125 MG tablet Take 1 tablet (3.125 mg total) by mouth 2 (  two) times daily with a meal. 12/13/19 01/30/29  Para Skeans, MD  citalopram (CELEXA) 20 MG tablet Take 20 mg by mouth daily.     [provider]  dicyclomine (BENTYL) 10 MG capsule Take 10 mg by mouth 3 (three) times daily. 11/16/19   [provider]  docusate sodium (COLACE) 100 MG capsule Take 100 mg by mouth 2 (two) times daily.    [provider]  EPINEPHrine 0.3 mg/0.3 mL IJ SOAJ injection Inject 0.3 mg into the muscle as needed.  12/27/19   [provider]  Erenumab-aooe 140 MG/ML SOAJ Inject 140 mg into the skin every 28 (twenty-eight) days.    [provider]  fexofenadine (ALLEGRA) 180 MG tablet Take 180 mg by mouth daily.    [provider]  FIBER PO Take 1 tablet by mouth daily.     [provider]  finasteride (PROSCAR) 5 MG tablet Take 5 mg by mouth daily.    [provider]  furosemide (LASIX) 40 MG tablet TAKE 1 TABLET (40 MG TOTAL) BY MOUTH DAILY AS NEEDED FOR FLUID OR EDEMA. 05/23/20   Loel Dubonnet, NP  gabapentin (NEURONTIN) 800 MG  tablet Take 1 tablet by mouth 3 (three) times daily. 12/22/19   [provider]  icosapent Ethyl (VASCEPA) 1 g capsule Take 2 capsules (2 g total) by mouth 2 (two) times daily. 02/08/20   Loel Dubonnet, NP  losartan (COZAAR) 25 MG tablet TAKE 1 TABLET EVERY DAY 05/23/20   Loel Dubonnet, NP  Methylcellulose, Laxative, 500 MG TABS Take 1 tablet by mouth daily.    [provider]  Multiple Vitamins-Minerals (HAIR/SKIN/NAILS PO) Take 1 tablet by mouth 3 (three) times daily.     [provider]  nitroGLYCERIN (NITROSTAT) 0.4 MG SL tablet Place 0.4 mg under the tongue every 5 (five) minutes as needed for chest pain.     [provider]  pantoprazole (PROTONIX) 40 MG tablet Take 40 mg by mouth 2 (two) times daily.     [provider]  potassium chloride SA (K-DUR,KLOR-CON) 20 MEQ tablet Take 20 mEq by mouth daily.    [provider]  psyllium (REGULOID) 0.52 g capsule Take 0.52 g by mouth daily.     [provider]  rOPINIRole (REQUIP) 0.5 MG tablet Take 1 tablet by mouth at bedtime. 12/29/19   [provider]  rosuvastatin (CRESTOR) 40 MG tablet Take 1 tablet (40 mg total) by mouth daily. 05/19/20   Wellington Hampshire, MD  spironolactone (ALDACTONE) 25 MG tablet Take 0.5 tablets (12.5 mg total) by mouth daily. 05/16/20   Wellington Hampshire, MD  triamcinolone ointment (KENALOG) 0.1 % Apply 1 application topically 2 (two) times daily.    [provider]  vitamin B-12 (CYANOCOBALAMIN) 1000 MCG tablet Take 1,000 mcg by mouth daily.    [provider]    Allergies Amitriptyline, Cyclobenzaprine, Dicyclomine, Sacubitril-valsartan, Acetaminophen, Amitriptyline hcl, Bacon flavor, Hydrocodone, Fish oil, Lidocaine, Omega-3 fatty acids-vitamin e, Other, Oxycodone-acetaminophen, and Sulfa antibiotics    Social History Social History   Tobacco Use  . Smoking status: Former Smoker    Packs/day: 2.50    Types: Cigarettes     Quit date: 08/21/1991    Years since quitting: 28.8  . Smokeless tobacco: Never Used  Vaping Use  . Vaping Use: Never used  Substance Use Topics  . Alcohol use: Not Currently  . Drug use: No    Review of Systems Patient  denies headaches, rhinorrhea, blurry vision, numbness, shortness of breath, chest pain, edema, cough, abdominal pain, nausea, vomiting, diarrhea, dysuria, fevers, rashes or hallucinations unless otherwise stated above in HPI. ____________________________________________   PHYSICAL EXAM:  VITAL SIGNS: Vitals:   06/20/20 1455 06/20/20 1500  BP: 129/86 127/87  Pulse:  79  Resp: 16 15  Temp:    SpO2: 98% 97%    Constitutional: Alert and oriented.  Eyes: Conjunctivae are normal.  Head: Atraumatic. Nose: No congestion/rhinnorhea. Mouth/Throat: Mucous membranes are moist.   Neck: No stridor. Painless ROM.  Cardiovascular: Normal rate, regular rhythm. Grossly normal heart sounds.  Good peripheral circulation. Respiratory: Normal respiratory effort.  No retractions. Lungs CTAB. Gastrointestinal: Soft and nontender. No distention. No abdominal bruits. No CVA tenderness. Genitourinary:  Musculoskeletal: No lower extremity tenderness nor edema.  No joint effusions. Neurologic:  Normal speech and language. No gross focal neurologic deficits are appreciated. No facial droop Skin:  Skin is warm, dry and intact. No rash noted. Psychiatric: Mood and affect are normal. Speech and behavior are normal.  ____________________________________________   LABS (all labs ordered are listed, but only abnormal results are displayed)  Results for orders placed or performed during the hospital encounter of 06/20/20 (from the past 24 hour(s))  CBC with Differential     Status: None   Collection Time: 06/20/20  2:46 PM  Result Value Ref Range   WBC 8.6 4.0 - 10.5 K/uL   RBC 4.37 4.22 - 5.81 MIL/uL   Hemoglobin 14.2 13.0 - 17.0 g/dL   HCT 42.9 39.0 - 52.0 %   MCV 98.2 80.0 - 100.0  fL   MCH 32.5 26.0 - 34.0 pg   MCHC 33.1 30.0 - 36.0 g/dL   RDW 12.9 11.5 - 15.5 %   Platelets 171 150 - 400 K/uL   nRBC 0.0 0.0 - 0.2 %   Neutrophils Relative % 65 %   Neutro Abs 5.6 1.7 - 7.7 K/uL   Lymphocytes Relative 27 %   Lymphs Abs 2.3 0.7 - 4.0 K/uL   Monocytes Relative 6 %   Monocytes Absolute 0.5 0.1 - 1.0 K/uL   Eosinophils Relative 1 %   Eosinophils Absolute 0.1 0.0 - 0.5 K/uL   Basophils Relative 0 %   Basophils Absolute 0.0 0.0 - 0.1 K/uL   Immature Granulocytes 1 %   Abs Immature Granulocytes 0.05 0.00 - 0.07 K/uL  Comprehensive metabolic panel     Status: Abnormal   Collection Time: 06/20/20  2:46 PM  Result Value Ref Range   Sodium 137 135 - 145 mmol/L   Potassium 3.9 3.5 - 5.1 mmol/L   Chloride 97 (L) 98 - 111 mmol/L   CO2 28 22 - 32 mmol/L   Glucose, Bld 110 (H) 70 - 99 mg/dL   BUN 16 8 - 23 mg/dL   Creatinine, Ser 0.82 0.61 - 1.24 mg/dL   Calcium 9.5 8.9 - 10.3 mg/dL   Total Protein 7.1 6.5 - 8.1 g/dL   Albumin 4.6 3.5 - 5.0 g/dL   AST 33 15 - 41 U/L   ALT 33 0 - 44 U/L   Alkaline Phosphatase 48 38 - 126 U/L   Total Bilirubin 0.9 0.3 - 1.2 mg/dL   GFR, Estimated >60 >60 mL/min   Anion gap 12 5 - 15  Troponin I (High Sensitivity)     Status: Abnormal   Collection Time: 06/20/20  2:46 PM  Result Value Ref Range   Troponin I (High Sensitivity) 667 (HH) <18  ng/L   ____________________________________________  EKG My review and personal interpretation at Time: 14:44   Indication: chest pain  Rate: 70  Rhythm: sinus Axis: normal Other: inferolateral st and twave abn.  St segments improved from clinic ekg, does not meet STEMI criteria ____________________________________________  RADIOLOGY  I personally reviewed all radiographic images ordered to evaluate for the above acute complaints and reviewed radiology reports and findings.  These findings were personally discussed with the patient.  Please see medical record for radiology  report.  ____________________________________________   PROCEDURES  Procedure(s) performed:  .Critical Care Performed by: Merlyn Lot, MD Authorized by: Merlyn Lot, MD   Critical care provider statement:    Critical care time (minutes):  15   Critical care time was exclusive of:  Separately billable procedures and treating other patients   Critical care was necessary to treat or prevent imminent or life-threatening deterioration of the following conditions:  Cardiac failure   Critical care was time spent personally by me on the following activities:  Development of treatment plan with patient or surrogate, discussions with consultants, evaluation of patient's response to treatment, examination of patient, obtaining history from patient or surrogate, ordering and performing treatments and interventions, ordering and review of laboratory studies, ordering and review of radiographic studies, pulse oximetry, re-evaluation of patient's condition and review of old charts      Critical Care performed: yes ____________________________________________   INITIAL IMPRESSION / [place] / ED COURSE  Pertinent labs & imaging results that were available during my care of the patient were reviewed by me and considered in my medical decision making (see chart for details).   DDX: ACS, pericarditis, esophagitis, boerhaaves, pe, dissection, pna, bronchitis, costochondritis   Brick D Ziebell is a 70 y.o. who presents to the ED with presentation as described above.  Patient evaluated immediately at bedside after code STEMI was called from EKG in clinic.  Patient seen by Dr. And of cardiology immediately upon arrival.  His repeat EKG had improved from previous, no longer meeting STEMI criteria.  His pain is also improving.  Patient given aspirin as well as started on heparin infusion and bolus.  Based on his EKG changes will be taken Cath Lab for urgent PCI.  Have discussed with the  patient and available family all diagnostics and treatments performed thus far and all questions were answered to the best of my ability. The patient demonstrates understanding and agreement with plan.  3:25 patient remains hemodynamically stable still complaining of discomfort.  Patient taken to Cath Lab for PCI.    The patient was evaluated in Emergency Department today for the symptoms described in the history of present illness. He/she was evaluated in the context of the global COVID-19 pandemic, which necessitated consideration that the patient might be at risk for infection with the SARS-CoV-2 virus that causes COVID-19. Institutional protocols and algorithms that pertain to the evaluation of patients at risk for COVID-19 are in a state of rapid change based on information released by regulatory bodies including the CDC and federal and state organizations. These policies and algorithms were followed during the patient's care in the ED.  As part of my medical decision making, I reviewed the following data within the [place] notes reviewed and incorporated, Labs reviewed, notes from prior ED visits and [place] Controlled Substance Database   ____________________________________________   FINAL CLINICAL IMPRESSION(S) / ED DIAGNOSES  Final diagnoses:  Unstable angina ([place])      NEW MEDICATIONS  STARTED DURING THIS VISIT:  Current Discharge Medication List       Note:  This document was prepared using Dragon voice recognition software and may include unintentional dictation errors.    Merlyn Lot, MD 06/20/20 1536

## 2020-06-20 NOTE — Consult Note (Signed)
ANTICOAGULATION CONSULT NOTE - Initial Consult  Pharmacy Consult for heparin gtt Indication: chest pain/ACS  Allergies  Allergen Reactions  . Amitriptyline Other (See Comments)    Pt unsure of what happens Other reaction(s): Other (See Comments) Pt unsure of what happens Pt unsure of what happens  . Cyclobenzaprine Other (See Comments) and Hives    Pt unsure what hapens Pt unsure what hapens Pt unsure what hapens   . Dicyclomine Shortness Of Breath and Swelling  . Sacubitril-Valsartan Anaphylaxis and Itching  . Acetaminophen   . Amitriptyline Hcl   . Berniece Salines Flavor Diarrhea  . Hydrocodone   . Fish Oil Itching  . Lidocaine Nausea And Vomiting, Other (See Comments) and Nausea Only    Nasal Spray (only time had reaction) Other reaction(s): Dizziness Nasal Spray (only time had reaction)  . Omega-3 Fatty Acids-Vitamin E Itching  . Other Diarrhea and Itching    Reactive agents: Onions, bacon, steak  . Oxycodone-Acetaminophen Other (See Comments)    Lowers BP Other reaction(s): Other (See Comments) Lowers BP Lowers BP  . Sulfa Antibiotics Itching    Patient Measurements: Height: 5\' 5"  (165.1 cm) Weight: 79.4 kg (175 lb) IBW/kg (Calculated) : 61.5 Heparin Dosing Weight: 79.4 kg   Vital Signs: Temp: 98.6 F (37 C) (03/01 1449) Temp Source: Oral (03/01 1449) BP: 127/87 (03/01 1500) Pulse Rate: 79 (03/01 1500)  Labs: Recent Labs    06/20/20 1446  HGB 14.2  HCT 42.9  PLT 171  CREATININE 0.82  TROPONINIHS 667*    Estimated Creatinine Clearance: 82.6 mL/min (by C-G formula based on SCr of 0.82 mg/dL).   Medical History: Past Medical History:  Diagnosis Date  . Anxiety   . Arthritis   . Barrett's esophagus   . Bowel obstruction ([place]) 02/2009   small bowel  . Chronic chest pain   . Coronary artery disease    a. 1993 CABG; b. 2007 Redo CABG; c. 09/2015 Cath: LM mild dzs, LAD 100ost, RI mild dzs, LCX 60p, OM1 100, RCA 100ost, VG->OM2 min irregs, LIMA->LAD nl,  VG->RPDA 30ost, 20d, nl EF->Med rx.  . Depression   . Diverticulitis   . Diverticulosis   . Erectile dysfunction   . Fibromyalgia   . Gastric ulcer   . Gastritis   . GERD (gastroesophageal reflux disease)   . Headache   . Hemorrhoids   . HFrEF (heart failure with reduced ejection fraction) ([place])    a. 09/2016 Echo: EF 55-60%, no rwma, triv AI, mild MR, mildly dil LA. -> as of April 2020: EF 40 to 45%.  Septal dyssynergy/hypokinesis due to postop state but otherwise unable to assess wall motion [name] due to poor study.  . History of myocardial infarct at age less than 70 years   . Hyperlipidemia   . Hypertension   . Kidney stones    going to [facility]  June 26 to see kidney specialist  . Lightheadedness   . Mass on back   . Nephrolithiasis   . Orthostatic hypotension    a. Improved after discontinuation of metoprolol.  . OSA (obstructive sleep apnea)    a. On CPAP.  [name] Pulmonary embolism ([place]) 01/2006   Post-op/treated  . Skin lesions, generalized   . Stroke [facility]) 1993   right side weakness no blood thinners   on Aspirin  . Syncope and collapse   . Urticaria     Medications:  Heparin 4,000 units IV x 1 06/20/20 1500   Assessment: 70 y.o. male with past  medical history of CAD s/p CABG presents to the ER for evaluation of chest pain and pressure. Presented to cardiologist with chest pain this AM. EKG c/f acute STEMI and [facility]. Pharmacy has been consulted for initiation and management of heparin infusion for ACS.  Patient taken to cath lab  Baseline CBC WNL, aPTT, INR ordered Heparin Dosing Weight: 79.4 kg   Goal of Therapy:  Heparin level 0.3-0.7 units/ml Monitor platelets by anticoagulation protocol: Yes   Plan:  Withhold bolus due to IV heparin given in ED and during PCI. Start heparin infusion at 950 units/hr Check anti-Xa level in 6 hours following initiation of infusion and daily while on heparin Continue to monitor H&H and  platelets  Dorothe Pea, PharmD, BCPS Clinical Pharmacist  06/20/2020,3:37 PM

## 2020-06-20 NOTE — H&P (Signed)
History and Physical   Willie Clark STM:[id number] DOB: [birth year] DOA: 06/20/2020  PCP: [name], MD   Patient coming from: [facility] clinic cardiologist  Chief Complaint: Chest pain  HPI: Willie Clark is a 70 y.o. male with medical history significant of CAD status post MI and bypass, CHF, depression/anxiety, hypertension, hyperlipidemia, intermittent claudication, obstructive uropathy, TIA, diverticulosis, GERD/Barrett's who presents with chest pain.  Patient initially experienced chest pain this morning and he took 4 nitros and aspirin at home with improvement in his pain.  He had some associated sensation of warmth at the time, but denies nausea, diaphoresis.  He then presented to his cardiologist's office at the [facility] clinic.  There he was given additional aspirin and was noted to have ST elevation on EKG and sent to ED as a code STEMI.  Patient seen by cardiology in the ED and taken to the Cath Lab.  He was found to have 99% blockage of his SVG-RCA graft requiring stents to reconstitute.  Patient seen in ICU after intervention.  Patient has some residual chest soreness and mild shortness of breath.  He denies fevers, chills, abdominal pain constipation, diarrhea, nausea, vomiting.  ED Course: Vital signs in the ED were stable.  Lab work-up showed CMP with chloride 97, glucose 110.  CBC within normal limits.  Troponin initially elevated at 667 prior to cath.  Respiratory panel for flu and Covid is negative.  Chest x-ray showed no acute abnormality.  As above patient had received aspirin and nitro and was taken for cath receiving stents.  Review of Systems: As per HPI otherwise all other systems reviewed and are negative.  Past Medical History:  Diagnosis Date  . Anxiety   . Arthritis   . Barrett's esophagus   . Bowel obstruction ([place]) 02/2009   small bowel  . Chronic chest pain   . Coronary artery disease    a. 1993 CABG; b. 2007 Redo CABG; c. 09/2015 Cath: LM mild dzs, LAD 100ost,  RI mild dzs, LCX 60p, OM1 100, RCA 100ost, VG->OM2 min irregs, LIMA->LAD nl, VG->RPDA 30ost, 20d, nl EF->Med rx.  . Depression   . Diverticulitis   . Diverticulosis   . Erectile dysfunction   . Fibromyalgia   . Gastric ulcer   . Gastritis   . GERD (gastroesophageal reflux disease)   . Headache   . Hemorrhoids   . HFrEF (heart failure with reduced ejection fraction) ([place])    a. 09/2016 Echo: EF 55-60%, no rwma, triv AI, mild MR, mildly dil LA. -> as of April 2020: EF 40 to 45%.  Septal dyssynergy/hypokinesis due to postop state but otherwise unable to assess wall motion [name] due to poor study.  . History of myocardial infarct at age less than 29 years   . Hyperlipidemia   . Hypertension   . Kidney stones    going to [facility]  June 26 to see kidney specialist  . Lightheadedness   . Mass on back   . Nephrolithiasis   . Orthostatic hypotension    a. Improved after discontinuation of metoprolol.  . OSA (obstructive sleep apnea)    a. On CPAP.  [name] Pulmonary embolism ([place]) 01/2006   Post-op/treated  . Skin lesions, generalized   . Stroke [facility] 1993   right side weakness no blood thinners   on Aspirin  . Syncope and collapse   . Urticaria     Past Surgical History:  Procedure Laterality Date  . BACK SURGERY    .  bowel obstruction  01/2009  . CARDIAC CATHETERIZATION  11/2006   patent grafts. Significant OM3 disease and occluded diagonals.  [name] CARDIAC CATHETERIZATION  06/2010   patent grafts. significant ISR in proximal LCX but OM2 is bypassed and gives retrograde flow to OM3.   [name] CARDIAC CATHETERIZATION  06/2013   [facility]: Patent grafts with 60% proximal in-stent restenosis in the left circumflex with FFR of 0.85  . CARDIAC CATHETERIZATION Left 09/25/2015   Procedure: Left Heart Cath and Cors/Grafts Angiography;  Surgeon: Wellington Hampshire, MD;  Location: [place] CV LAB;  Service: Cardiovascular;  Laterality: Left;  . CERVICAL FUSION    . CHOLECYSTECTOMY    . COLON SURGERY   01/2009   BOWEL RESECTION DUE TO SMALL BOWEL OBSTRUCTION  . COLONOSCOPY    . COLONOSCOPY WITH PROPOFOL N/A 10/28/2017   Procedure: COLONOSCOPY WITH PROPOFOL;  Surgeon: Lollie Sails, MD;  Location: [facility] ENDOSCOPY;  Service: Endoscopy;  Laterality: N/A;  . CORONARY ANGIOPLASTY  2006  . CORONARY ARTERY BYPASS GRAFT  1993/01/2006   redo at [facility]. LIMA to LAD, SVG to OM2 and SVG to RPDA  . CYSTOSCOPY WITH STENT PLACEMENT Bilateral 09/24/2015   Procedure: CYSTOSCOPY WITH BILATERAL RETROGRADES, BILATERAL STENT PLACEMENT;  Surgeon: Festus Aloe, MD;  Location: [facility] ORS;  Service: Urology;  Laterality: Bilateral;  . ESOPHAGOGASTRODUODENOSCOPY N/A 10/25/2014   Procedure: ESOPHAGOGASTRODUODENOSCOPY (EGD);  Surgeon: Lollie Sails, MD;  Location: [facility] ENDOSCOPY;  Service: Endoscopy;  Laterality: N/A;  . ESOPHAGOGASTRODUODENOSCOPY (EGD) WITH PROPOFOL N/A 02/03/2017   Procedure: ESOPHAGOGASTRODUODENOSCOPY (EGD) WITH PROPOFOL;  Surgeon: Lollie Sails, MD;  Location: [facility] ENDOSCOPY;  Service: Endoscopy;  Laterality: N/A;  . ESOPHAGOGASTRODUODENOSCOPY (EGD) WITH PROPOFOL N/A 04/18/2017   Procedure: ESOPHAGOGASTRODUODENOSCOPY (EGD) WITH PROPOFOL;  Surgeon: Lollie Sails, MD;  Location: [facility] ENDOSCOPY;  Service: Endoscopy;  Laterality: N/A;  . EYE SURGERY Bilateral    Cataract Extraction with IOL  . HERNIA REPAIR    . INGUINAL HERNIA REPAIR Bilateral 01/23/2016   Procedure: HERNIA REPAIR INGUINAL ADULT BILATERAL;  Surgeon: Leonie Green, MD;  Location: [facility] ORS;  Service: General;  Laterality: Bilateral;  . LEFT HEART CATH AND CORS/GRAFTS ANGIOGRAPHY N/A 12/13/2019   Procedure: LEFT HEART CATH AND CORS/GRAFTS ANGIOGRAPHY;  Surgeon: Wellington Hampshire, MD;  Location: [place] CV LAB;  Service: Cardiovascular;  Laterality: N/A;  . LUMBAR LAMINECTOMY/DECOMPRESSION MICRODISCECTOMY Left 10/01/2016   Procedure: Left Lumbar four-five Laminotomy for resection of synovial cyst;  Surgeon: Erline Levine, MD;  Location: [place];  Service: Neurosurgery;  Laterality: Left;  left  . NECK SURGERY  06/2009  . SHOULDER SURGERY  2010    Social History  reports that he quit smoking about 28 years ago. His smoking use included cigarettes. He smoked 2.50 packs per day. He has never used smokeless tobacco. He reports previous alcohol use. He reports that he does not use drugs.  Allergies  Allergen Reactions  . Amitriptyline Other (See Comments)    Pt unsure of what happens Other reaction(s): Other (See Comments) Pt unsure of what happens Pt unsure of what happens  . Cyclobenzaprine Other (See Comments) and Hives    Pt unsure what hapens Pt unsure what hapens Pt unsure what hapens   . Dicyclomine Shortness Of Breath and Swelling  . Sacubitril-Valsartan Anaphylaxis and Itching  . Acetaminophen   . Amitriptyline Hcl   . Berniece Salines Flavor Diarrhea  . Hydrocodone   . Fish Oil Itching  . Lidocaine Nausea And Vomiting, Other (See Comments) and Nausea  Only    Nasal Spray (only time had reaction) Other reaction(s): Dizziness Nasal Spray (only time had reaction)  . Omega-3 Fatty Acids-Vitamin E Itching  . Other Diarrhea and Itching    Reactive agents: Onions, bacon, steak  . Oxycodone-Acetaminophen Other (See Comments)    Lowers BP Other reaction(s): Other (See Comments) Lowers BP Lowers BP  . Sulfa Antibiotics Itching    Family History  Problem Relation Age of Onset  . Heart disease Father 9  . Heart attack Father   Reviewed on admission  Prior to Admission medications   Medication Sig Start Date End Date Taking? Authorizing Provider  albuterol (PROVENTIL HFA;VENTOLIN HFA) 108 (90 Base) MCG/ACT inhaler Inhale into the lungs every 4 (four) hours as needed for wheezing or shortness of breath.    [provider]  aspirin EC 81 MG tablet Take 81 mg by mouth daily.    [provider]  carvedilol (COREG) 3.125 MG tablet Take 1 tablet (3.125 mg total) by mouth 2 (two) times  daily with a meal. 12/13/19 01/30/29  Para Skeans, MD  citalopram (CELEXA) 20 MG tablet Take 20 mg by mouth daily.     [provider]  dicyclomine (BENTYL) 10 MG capsule Take 10 mg by mouth 3 (three) times daily. 11/16/19   [provider]  docusate sodium (COLACE) 100 MG capsule Take 100 mg by mouth 2 (two) times daily.    [provider]  EPINEPHrine 0.3 mg/0.3 mL IJ SOAJ injection Inject 0.3 mg into the muscle as needed.  12/27/19   [provider]  Erenumab-aooe 140 MG/ML SOAJ Inject 140 mg into the skin every 28 (twenty-eight) days.    [provider]  fexofenadine (ALLEGRA) 180 MG tablet Take 180 mg by mouth daily.    [provider]  FIBER PO Take 1 tablet by mouth daily.     [provider]  finasteride (PROSCAR) 5 MG tablet Take 5 mg by mouth daily.    [provider]  furosemide (LASIX) 40 MG tablet TAKE 1 TABLET (40 MG TOTAL) BY MOUTH DAILY AS NEEDED FOR FLUID OR EDEMA. 05/23/20   Loel Dubonnet, NP  gabapentin (NEURONTIN) 800 MG tablet Take 1 tablet by mouth 3 (three) times daily. 12/22/19   [provider]  icosapent Ethyl (VASCEPA) 1 g capsule Take 2 capsules (2 g total) by mouth 2 (two) times daily. 02/08/20   Loel Dubonnet, NP  losartan (COZAAR) 25 MG tablet TAKE 1 TABLET EVERY DAY 05/23/20   Loel Dubonnet, NP  Methylcellulose, Laxative, 500 MG TABS Take 1 tablet by mouth daily.    [provider]  Multiple Vitamins-Minerals (HAIR/SKIN/NAILS PO) Take 1 tablet by mouth 3 (three) times daily.     [provider]  nitroGLYCERIN (NITROSTAT) 0.4 MG SL tablet Place 0.4 mg under the tongue every 5 (five) minutes as needed for chest pain.     [provider]  pantoprazole (PROTONIX) 40 MG tablet Take 40 mg by mouth 2 (two) times daily.     [provider]  potassium chloride SA (K-DUR,KLOR-CON) 20 MEQ tablet Take 20 mEq by mouth daily.    [provider]   psyllium (REGULOID) 0.52 g capsule Take 0.52 g by mouth daily.     [provider]  rOPINIRole (REQUIP) 0.5 MG tablet Take 1 tablet by mouth at bedtime. 12/29/19   [provider]  rosuvastatin (CRESTOR) 40 MG tablet Take 1 tablet (40 mg total) by  mouth daily. 05/19/20   Wellington Hampshire, MD  spironolactone (ALDACTONE) 25 MG tablet Take 0.5 tablets (12.5 mg total) by mouth daily. 05/16/20   Wellington Hampshire, MD  triamcinolone ointment (KENALOG) 0.1 % Apply 1 application topically 2 (two) times daily.    [provider]  vitamin B-12 (CYANOCOBALAMIN) 1000 MCG tablet Take 1,000 mcg by mouth daily.    [provider]    Physical Exam: Vitals:   06/20/20 1455 06/20/20 1500 06/20/20 1815 06/20/20 1900  BP: 129/86 127/87 121/85 131/87  Pulse:  79 63 63  Resp: 16 15 18 16   Temp:   98.5 F (36.9 C)   TempSrc:   Oral   SpO2: 98% 97% 96% 94%  Weight:   79.4 kg   Height:   5\' 9"  (1.753 m)    Physical Exam Constitutional:      General: He is not in acute distress.    Appearance: Normal appearance.  HENT:     Head: Normocephalic and atraumatic.     Mouth/Throat:     Mouth: Mucous membranes are moist.     Pharynx: Oropharynx is clear.  Eyes:     Extraocular Movements: Extraocular movements intact.     Pupils: Pupils are equal, round, and reactive to light.  Cardiovascular:     Rate and Rhythm: Normal rate and regular rhythm.     Pulses: Normal pulses.     Heart sounds: Normal heart sounds.  Pulmonary:     Effort: Pulmonary effort is normal. No respiratory distress.     Breath sounds: Normal breath sounds.  Abdominal:     General: Bowel sounds are normal. There is no distension.     Palpations: Abdomen is soft.     Tenderness: There is no abdominal tenderness.  Musculoskeletal:        General: No swelling or deformity.  Skin:    General: Skin is warm and dry.  Neurological:     General: No focal deficit present.     Mental Status: Mental status  is at baseline.    Labs on Admission: I have personally reviewed following labs and imaging studies  CBC: Recent Labs  Lab 06/20/20 1446  WBC 8.6  NEUTROABS 5.6  HGB 14.2  HCT 42.9  MCV 98.2  PLT 989    Basic Metabolic Panel: Recent Labs  Lab 06/20/20 1446  NA 137  K 3.9  CL 97*  CO2 28  GLUCOSE 110*  BUN 16  CREATININE 0.82  CALCIUM 9.5    GFR: Estimated Creatinine Clearance: 85 mL/min (by C-G formula based on SCr of 0.82 mg/dL).  Liver Function Tests: Recent Labs  Lab 06/20/20 1446  AST 33  ALT 33  ALKPHOS 48  BILITOT 0.9  PROT 7.1  ALBUMIN 4.6    Urine analysis:    Component Value Date/Time   COLORURINE STRAW (A) 08/01/2018 0133   APPEARANCEUR CLEAR (A) 08/01/2018 0133   LABSPEC 1.010 08/01/2018 0133   PHURINE 7.0 08/01/2018 0133   GLUCOSEU NEGATIVE 08/01/2018 0133   HGBUR NEGATIVE 08/01/2018 0133   BILIRUBINUR NEGATIVE 08/01/2018 0133   KETONESUR NEGATIVE 08/01/2018 0133   PROTEINUR NEGATIVE 08/01/2018 0133   NITRITE NEGATIVE 08/01/2018 0133   LEUKOCYTESUR NEGATIVE 08/01/2018 0133    Radiological Exams on Admission: DG Chest Portable 1 View  Result Date: 06/20/2020 CLINICAL DATA:  Chest pain and EKG findings suspicious for MI EXAM: PORTABLE CHEST 1 VIEW COMPARISON:  12/11/2019 FINDINGS: Cardiac shadow is stable. Postsurgical changes are again  seen. Lungs are well aerated bilaterally without focal infiltrate or sizable effusion no acute bony abnormality is seen. Previously seen nodular changes on the left are not as well appreciated on today's exam. IMPRESSION: No acute abnormality noted. Electronically Signed   By: Inez Catalina M.D.   On: 06/20/2020 15:30   EKG: Independently reviewed.  Initial EKG with ST elevation in inferior leads with reciprocal ST depression in lateral leads.  Assessment/Plan Principal Problem:   Acute coronary syndrome (HCC) Active Problems:   Coronary artery disease involving native coronary artery of native heart  with angina pectoris (HCC)   Hyperlipidemia   Intermittent claudication (HCC)   TIA (transient ischemic attack)   Obstructive uropathy   Chronic combined systolic and diastolic congestive heart failure (HCC)   Essential hypertension   NSTEMI (non-ST elevated myocardial infarction) (HCC)   GERD (gastroesophageal reflux disease)  ACS  Hyperlipidemia CAD status post MI and bypass > Patient already seen by cardiology and as per HPI has already been taken for cath with stents placed in his SVG-RCA graft. - Monitor in ICU - Continue dual antiplatelet therapy with aspirin and Brilinta - Follow-up echocardiogram - Patient is receiving Aggrastat status post intervention - Continue home Coreg, losartan, rosuvastatin - As per Dr. Darnelle Bos post procedural recommendations; if the patient has recurrent chest pain, consider PCI to SVG-OM  CHF Hypertension > Last echo in 2021 with EF 40-45%, G1DD - Repeat echocardiogram in the setting of ACS pending - Continue home Coreg, losartan, spironolactone - Holding daily Lasix at this time (takes 40 mg daily at home)  Depression/anxiety/sleep - Continue home ropinirole and Neurontin  Obstructive uropathy - Continue home finasteride  TIA Intermittent claudication - On dual antiplatelet therapy and statin as above  GERD  - Continue PPI  DVT prophylaxis: Anticoagulation per cardiology  Code Status:   Full  Family Communication:  Spouse present at bedside Disposition Plan:   Patient is from:  Home  Anticipated DC to:  Home  Anticipated DC date:  2 to 3 days  Anticipated DC barriers: None  Consults called:  Patient already seen by cardiology and already undergone cath with stenting  Admission status:  Inpatient, ICU   Severity of Illness: The appropriate patient status for this patient is INPATIENT. Inpatient status is judged to be reasonable and necessary in order to provide the required intensity of service to ensure the patient's safety. The  patient's presenting symptoms, physical exam findings, and initial radiographic and laboratory data in the context of their chronic comorbidities is felt to place them at high risk for further clinical deterioration. Furthermore, it is not anticipated that the patient will be medically stable for discharge from the hospital within 2 midnights of admission. The following factors support the patient status of inpatient.   " The patient's presenting symptoms include chest pain. " The worrisome physical exam findings include chest soreness with some associated dyspnea. " The initial radiographic and laboratory data are worrisome because of troponin 67, EKG changes indicated of of ischemia, cath showing occluded graft now status post intervention. " The chronic co-morbidities include CAD, CHF, depression, hypertension, hyperlipidemia, intermittent claudication, carotid neuropathy, TIA, diverticulosis, GERD.  * I certify that at the point of admission it is my clinical judgment that the patient will require inpatient hospital care spanning beyond 2 midnights from the point of admission due to high intensity of service, high risk for further deterioration and high frequency of surveillance required.Marcelyn Bruins MD [facility]  How to contact the [facility] Attending or Consulting provider [place] or covering provider during after hours [place], for this patient?   1. Check the care team in [facility] and look for a) attending/consulting [facility] provider listed and b) the [facility] team listed 2. Log into [email or web address] and use [place]'s universal password to access. If you do not have the password, please contact the hospital operator. 3. Locate the [facility] provider you are looking for under [facility] and page to a number that you can be directly reached. 4. If you still have difficulty reaching the provider, please page the [facility] (Director on Call) for the Hospitalists listed on amion for  assistance.  06/20/2020, 8:11 PM

## 2020-06-20 NOTE — Patient Instructions (Signed)
The patient will be transferred to the ED due to suspected inferior STEMI.

## 2020-06-20 NOTE — ED Triage Notes (Addendum)
Pt in room. Pt to ED via POV from cardiologist office. Cardiologist at bedside. Labs EKG being obtained. Pt states took 81mg  aspirin and 4 NTG this morning.

## 2020-06-20 NOTE — H&P (View-Only) (Signed)
Cardiology Office Note   Date:  06/20/2020   ID:  Willie Clark, DOB [birth year], MRN [id number]  PCP:  [name], MD  Cardiologist:   [name], MD   Chief Complaint  Patient presents with  . 4 month follow up     Patient c/o chest pain, shortness of breath and left arm pain, on a scale from 1-10 being a 10, took 4 NTG today with relief for about 10 minutes but started back.       History of Present Illness: Willie Clark is a 70 y.o. male who presents for regarding coronary artery disease and chronic systolic heart failure.  He has known history of coronary artery disease. He is status post coronary artery bypass graft surgery in 1993 and redo in 2007.   He suffers from fibromyalgia and chronic pain.  He had GI symptoms in 2015 due to documented gastric ulcer. He had neck surgery in 0102 without complications. He has known history of chronic neck pain with 2 previous cervical spine surgeries.    He was hospitalized in August of 2021with chest pain.  Echocardiogram showed an EF of 40 to 45%.  Cardiac catheterization showed severe underlying three-vessel coronary arteries with patent grafts and no significant change since 2017.  The patient antihypertensive medications were switched to losartan and carvedilol.  Rosuvastatin was increased to 40 mg daily. Losartan was subsequently switched to [facility].  However, the patient had allergic reaction with rash and itching that had to be treated in the emergency room.  Spironolactone was subsequently added.  He presents to the clinic today with chest pain.  Chest pain started at 9 AM and described as substernal tightness very similar to his previous myocardial infarction.  Initially the chest pain was 10 out of 10.  He was given 1 sublingual nitroglycerin and it improved to 7 out of 10.  EKG showed minor inferior ST elevation with reciprocal changes.   Past Medical History:  Diagnosis Date  . Anxiety   . Arthritis   . Barrett's  esophagus   . Bowel obstruction ([place]) 02/2009   small bowel  . Chronic chest pain   . Coronary artery disease    a. 1993 CABG; b. 2007 Redo CABG; c. 09/2015 Cath: LM mild dzs, LAD 100ost, RI mild dzs, LCX 60p, OM1 100, RCA 100ost, VG->OM2 min irregs, LIMA->LAD nl, VG->RPDA 30ost, 20d, nl EF->Med rx.  . Depression   . Diverticulitis   . Diverticulosis   . Erectile dysfunction   . Fibromyalgia   . Gastric ulcer   . Gastritis   . GERD (gastroesophageal reflux disease)   . Headache   . Hemorrhoids   . HFrEF (heart failure with reduced ejection fraction) ([place])    a. 09/2016 Echo: EF 55-60%, no rwma, triv AI, mild MR, mildly dil LA. -> as of April 2020: EF 40 to 45%.  Septal dyssynergy/hypokinesis due to postop state but otherwise unable to assess wall motion [name] due to poor study.  . History of myocardial infarct at age less than 53 years   . Hyperlipidemia   . Hypertension   . Kidney stones    going to [facility]  June 26 to see kidney specialist  . Lightheadedness   . Mass on back   . Nephrolithiasis   . Orthostatic hypotension    a. Improved after discontinuation of metoprolol.  . OSA (obstructive sleep apnea)    a. On CPAP.  [name] Pulmonary embolism ([place]) 01/2006  Post-op/treated  . Skin lesions, generalized   . Stroke [facility]) 1993   right side weakness no blood thinners   on Aspirin  . Syncope and collapse   . Urticaria     Past Surgical History:  Procedure Laterality Date  . BACK SURGERY    . bowel obstruction  01/2009  . CARDIAC CATHETERIZATION  11/2006   patent grafts. Significant OM3 disease and occluded diagonals.  [name] CARDIAC CATHETERIZATION  06/2010   patent grafts. significant ISR in proximal LCX but OM2 is bypassed and gives retrograde flow to OM3.   [name] CARDIAC CATHETERIZATION  06/2013   [facility]: Patent grafts with 60% proximal in-stent restenosis in the left circumflex with FFR of 0.85  . CARDIAC CATHETERIZATION Left 09/25/2015   Procedure: Left Heart Cath and  Cors/Grafts Angiography;  Surgeon: Wellington Hampshire, MD;  Location: [place] CV LAB;  Service: Cardiovascular;  Laterality: Left;  . CERVICAL FUSION    . CHOLECYSTECTOMY    . COLON SURGERY  01/2009   BOWEL RESECTION DUE TO SMALL BOWEL OBSTRUCTION  . COLONOSCOPY    . COLONOSCOPY WITH PROPOFOL N/A 10/28/2017   Procedure: COLONOSCOPY WITH PROPOFOL;  Surgeon: Lollie Sails, MD;  Location: [facility] ENDOSCOPY;  Service: Endoscopy;  Laterality: N/A;  . CORONARY ANGIOPLASTY  2006  . CORONARY ARTERY BYPASS GRAFT  1993/01/2006   redo at [facility]. LIMA to LAD, SVG to OM2 and SVG to RPDA  . CYSTOSCOPY WITH STENT PLACEMENT Bilateral 09/24/2015   Procedure: CYSTOSCOPY WITH BILATERAL RETROGRADES, BILATERAL STENT PLACEMENT;  Surgeon: Festus Aloe, MD;  Location: [facility] ORS;  Service: Urology;  Laterality: Bilateral;  . ESOPHAGOGASTRODUODENOSCOPY N/A 10/25/2014   Procedure: ESOPHAGOGASTRODUODENOSCOPY (EGD);  Surgeon: Lollie Sails, MD;  Location: [facility] ENDOSCOPY;  Service: Endoscopy;  Laterality: N/A;  . ESOPHAGOGASTRODUODENOSCOPY (EGD) WITH PROPOFOL N/A 02/03/2017   Procedure: ESOPHAGOGASTRODUODENOSCOPY (EGD) WITH PROPOFOL;  Surgeon: Lollie Sails, MD;  Location: [facility] ENDOSCOPY;  Service: Endoscopy;  Laterality: N/A;  . ESOPHAGOGASTRODUODENOSCOPY (EGD) WITH PROPOFOL N/A 04/18/2017   Procedure: ESOPHAGOGASTRODUODENOSCOPY (EGD) WITH PROPOFOL;  Surgeon: Lollie Sails, MD;  Location: [facility] ENDOSCOPY;  Service: Endoscopy;  Laterality: N/A;  . EYE SURGERY Bilateral    Cataract Extraction with IOL  . HERNIA REPAIR    . INGUINAL HERNIA REPAIR Bilateral 01/23/2016   Procedure: HERNIA REPAIR INGUINAL ADULT BILATERAL;  Surgeon: Leonie Green, MD;  Location: [facility] ORS;  Service: General;  Laterality: Bilateral;  . LEFT HEART CATH AND CORS/GRAFTS ANGIOGRAPHY N/A 12/13/2019   Procedure: LEFT HEART CATH AND CORS/GRAFTS ANGIOGRAPHY;  Surgeon: Wellington Hampshire, MD;  Location: [place] CV LAB;  Service:  Cardiovascular;  Laterality: N/A;  . LUMBAR LAMINECTOMY/DECOMPRESSION MICRODISCECTOMY Left 10/01/2016   Procedure: Left Lumbar four-five Laminotomy for resection of synovial cyst;  Surgeon: Erline Levine, MD;  Location: [place];  Service: Neurosurgery;  Laterality: Left;  left  . NECK SURGERY  06/2009  . SHOULDER SURGERY  2010     Current Outpatient Medications  Medication Sig Dispense Refill  . albuterol (PROVENTIL HFA;VENTOLIN HFA) 108 (90 Base) MCG/ACT inhaler Inhale into the lungs every 4 (four) hours as needed for wheezing or shortness of breath.    [name] aspirin EC 81 MG tablet Take 81 mg by mouth daily.    . carvedilol (COREG) 3.125 MG tablet Take 1 tablet (3.125 mg total) by mouth 2 (two) times daily with a meal. 60 tablet 0  . citalopram (CELEXA) 20 MG tablet Take 20 mg by mouth daily.     [name]  dicyclomine (BENTYL) 10 MG capsule Take 10 mg by mouth 3 (three) times daily.    [name] docusate sodium (COLACE) 100 MG capsule Take 100 mg by mouth 2 (two) times daily.    [name] EPINEPHrine 0.3 mg/0.3 mL IJ SOAJ injection Inject 0.3 mg into the muscle as needed.     Eduard Roux 140 MG/ML SOAJ Inject 140 mg into the skin every 28 (twenty-eight) days.    . fexofenadine (ALLEGRA) 180 MG tablet Take 180 mg by mouth daily.    [name] FIBER PO Take 1 tablet by mouth daily.     . finasteride (PROSCAR) 5 MG tablet Take 5 mg by mouth daily.    . furosemide (LASIX) 40 MG tablet TAKE 1 TABLET (40 MG TOTAL) BY MOUTH DAILY AS NEEDED FOR FLUID OR EDEMA. 90 tablet 0  . gabapentin (NEURONTIN) 800 MG tablet Take 1 tablet by mouth 3 (three) times daily.    [name] icosapent Ethyl (VASCEPA) 1 g capsule Take 2 capsules (2 g total) by mouth 2 (two) times daily. 360 capsule 1  . losartan (COZAAR) 25 MG tablet TAKE 1 TABLET EVERY DAY 90 tablet 0  . Methylcellulose, Laxative, 500 MG TABS Take 1 tablet by mouth daily.    . Multiple Vitamins-Minerals (HAIR/SKIN/NAILS PO) Take 1 tablet by mouth 3 (three) times daily.     . nitroGLYCERIN  (NITROSTAT) 0.4 MG SL tablet Place 0.4 mg under the tongue every 5 (five) minutes as needed for chest pain.     . pantoprazole (PROTONIX) 40 MG tablet Take 40 mg by mouth 2 (two) times daily.     . potassium chloride SA (K-DUR,KLOR-CON) 20 MEQ tablet Take 20 mEq by mouth daily.    . psyllium (REGULOID) 0.52 g capsule Take 0.52 g by mouth daily.     [name] rOPINIRole (REQUIP) 0.5 MG tablet Take 1 tablet by mouth at bedtime.    . rosuvastatin (CRESTOR) 40 MG tablet Take 1 tablet (40 mg total) by mouth daily. 90 tablet 1  . spironolactone (ALDACTONE) 25 MG tablet Take 0.5 tablets (12.5 mg total) by mouth daily. 45 tablet 1  . triamcinolone ointment (KENALOG) 0.1 % Apply 1 application topically 2 (two) times daily.    . vitamin B-12 (CYANOCOBALAMIN) 1000 MCG tablet Take 1,000 mcg by mouth daily.     No current facility-administered medications for this visit.    Allergies:   Amitriptyline, Cyclobenzaprine, Dicyclomine, Sacubitril-valsartan, Acetaminophen, Amitriptyline hcl, Bacon flavor, Hydrocodone, Fish oil, Lidocaine, Omega-3 fatty acids-vitamin e, Other, Oxycodone-acetaminophen, and Sulfa antibiotics    Social History:  The patient  reports that he quit smoking about 28 years ago. His smoking use included cigarettes. He smoked 2.50 packs per day. He has never used smokeless tobacco. He reports previous alcohol use. He reports that he does not use drugs.   Family History:  The patient's family history includes Heart attack in his father; Heart disease (age of onset: 3) in his father.    ROS:  Please see the history of present illness.   Otherwise, review of systems are positive for none.   All other systems are reviewed and negative.    PHYSICAL EXAM: VS:  BP 140/80 (BP Location: Left Arm, Patient Position: Sitting, Cuff Size: Normal)   Pulse 76   Ht 5\' 9"  (1.753 m)   Wt 197 lb 6 oz (89.5 kg)   SpO2 99%   BMI 29.15 kg/m  , BMI Body mass index is 29.15 kg/m. GEN: Well nourished, well  developed, in  no acute distress  HEENT: normal  Neck: no JVD, carotid bruits, or masses Cardiac: RRR; no murmurs, rubs, or gallops,no edema  Respiratory:  clear to auscultation bilaterally, normal work of breathing GI: soft, nontender, nondistended, + BS MS: no deformity or atrophy  Skin: warm and dry, no rash Neuro:  Strength and sensation are intact Psych: euthymic mood, full affect   EKG:  EKG is  ordered today. Sinus rhythm with PACs.  1 mm of inferior ST elevation with reciprocal ST depression in 1 and aVL as well as V4 through V6.   Recent Labs: 12/11/2019: Hemoglobin 14.4; Platelets 167 12/12/2019: Magnesium 2.7 02/07/2020: ALT 28; BUN 10; Creatinine, Ser 0.81; Potassium 3.8; Sodium 139    Lipid Panel    Component Value Date/Time   CHOL 165 02/07/2020 0658   TRIG 317 (H) 02/07/2020 0658   HDL 41 02/07/2020 0658   CHOLHDL 4.0 02/07/2020 0658   VLDL 63 (H) 02/07/2020 0658   LDLCALC 61 02/07/2020 0658      Wt Readings from Last 3 Encounters:  06/20/20 197 lb 6 oz (89.5 kg)  02/24/20 202 lb (91.6 kg)  01/31/20 199 lb 2 oz (90.3 kg)         ASSESSMENT AND PLAN:  1.  Coronary artery disease involving native coronary arteries with inferior ST elevation myocardial infarction: The patient is still having chest pain in spite of nitroglycerin EKG is suspicious for inferior STEMI. Based on this, the patient will be transferred to the ED for STEMI activation.  I discussed with Dr. Quentin Cornwall in the ED as well as with Dr. Saunders Revel who is on-call for interventional cardiology.  2.  Chronic systolic heart failure: EF of 35 to 40%: He will require repeat echocardiogram.  3. Essential hypertension: Blood pressure is controlled.  4. Hyperlipidemia: Continue high-dose rosuvastatin.  Recent lipid profile showed an LDL of 61.  However, triglyceride was elevated at 317.  Since then, Vascepa was added.  He pays about $100 per month for this and might not be able to afford it long-term but  he wants to wait and see the results with it.  5. Mild carotid disease: Previous carotid Doppler showed less than 40% stenosis bilaterally. No need to repeat.  6.  Small abdominal aortic aneurysm: This was noted on ultrasound yesterday measuring 3 cm.  Recommend repeat ultrasound in 2 years .    Disposition: The patient was transferred to the ED for management of inferior STEMI.   Signed,  [name], MD  06/20/2020 2:23 PM    [place]

## 2020-06-20 NOTE — Addendum Note (Signed)
Addended by: Lamar Laundry on: 06/20/2020 02:54 PM   Modules accepted: Orders

## 2020-06-20 NOTE — ED Triage Notes (Signed)
Pt arrives from [facility] with EKG showing ACUTE MI. Pt taken back to room 7 at this time by EDT Lattie Haw.

## 2020-06-21 ENCOUNTER — Encounter: Payer: Self-pay | Admitting: Internal Medicine

## 2020-06-21 DIAGNOSIS — I25119 Atherosclerotic heart disease of native coronary artery with unspecified angina pectoris: Secondary | ICD-10-CM

## 2020-06-21 DIAGNOSIS — I5020 Unspecified systolic (congestive) heart failure: Secondary | ICD-10-CM

## 2020-06-21 LAB — BASIC METABOLIC PANEL
Anion gap: 9 (ref 5–15)
BUN: 16 mg/dL (ref 8–23)
CO2: 30 mmol/L (ref 22–32)
Calcium: 9 mg/dL (ref 8.9–10.3)
Chloride: 100 mmol/L (ref 98–111)
Creatinine, Ser: 1 mg/dL (ref 0.61–1.24)
GFR, Estimated: >60 mL/min (ref >60)
Glucose, Bld: 105 mg/dL — ABNORMAL HIGH (ref 70–99)
Potassium: 4.1 mmol/L (ref 3.5–5.1)
Sodium: 139 mmol/L (ref 135–145)

## 2020-06-21 LAB — CBC
HCT: 40.1 % (ref 39.0–52.0)
Hemoglobin: 13.7 g/dL (ref 13.0–17.0)
MCH: 33.4 pg (ref 26.0–34.0)
MCHC: 34.2 g/dL (ref 30.0–36.0)
MCV: 97.8 fL (ref 80.0–100.0)
Platelets: 153 10*3/uL (ref 150–400)
RBC: 4.1 MIL/uL — ABNORMAL LOW (ref 4.22–5.81)
RDW: 13.3 % (ref 11.5–15.5)
WBC: 9.5 10*3/uL (ref 4.0–10.5)
nRBC: 0 % (ref 0.0–0.2)

## 2020-06-21 LAB — TROPONIN I (HIGH SENSITIVITY)
Troponin I (High Sensitivity): 6713 ng/L (ref <18)
Troponin I (High Sensitivity): 6017 ng/L (ref <18)

## 2020-06-21 LAB — ECHOCARDIOGRAM COMPLETE
Area-P 1/2: 3.37 cm2
Height: 69 in
S' Lateral: 4.02 cm
Weight: 2800.72 oz

## 2020-06-21 LAB — GLUCOSE, CAPILLARY: Glucose-Capillary: 81 mg/dL (ref 70–99)

## 2020-06-21 NOTE — Progress Notes (Signed)
Made Dr. Garen Lah aware of troponin of 6017. MD acknowledged and gave no new orders.

## 2020-06-21 NOTE — Progress Notes (Addendum)
Progress Note  Patient Name: Willie Clark Date of Encounter: 06/21/2020  [facility] Cardiologist: [name], MD   Subjective   Patient had sharp chest pain overnight, not similar to prior episode. Breathing stable. Cath site with no complications.   Inpatient Medications    Scheduled Meds: . aspirin EC  81 mg Oral Daily  . carvedilol  3.125 mg Oral BID WC  . Chlorhexidine Gluconate Cloth  6 each Topical Daily  . citalopram  20 mg Oral Daily  . docusate sodium  100 mg Oral BID  . enoxaparin (LOVENOX) injection  40 mg Subcutaneous Q24H  . finasteride  5 mg Oral Daily  . gabapentin  800 mg Oral TID  . icosapent Ethyl  2 g Oral BID  . losartan  25 mg Oral Daily  . pantoprazole  40 mg Oral BID  . psyllium  1 packet Oral Daily  . rOPINIRole  0.5 mg Oral QHS  . rosuvastatin  40 mg Oral Daily  . sodium chloride flush  3 mL Intravenous Q12H  . spironolactone  12.5 mg Oral Daily  . ticagrelor  90 mg Oral BID  . vitamin B-12  1,000 mcg Oral Daily   Continuous Infusions: . sodium chloride    . sodium chloride     PRN Meds: sodium chloride, sodium chloride, albuterol, nitroGLYCERIN, ondansetron (ZOFRAN) IV, sodium chloride flush   Vital Signs    Vitals:   06/21/20 0900 06/21/20 1000 06/21/20 1100 06/21/20 1200  BP: 138/88  130/90 116/84  Pulse: 90 69 69 74  Resp: 19 18 18 16   Temp:    98.1 F (36.7 C)  TempSrc:    Oral  SpO2: 96% 96% 98% 98%  Weight:      Height:        Intake/Output Summary (Last 24 hours) at 06/21/2020 1252 Last data filed at 06/21/2020 0737 Gross per 24 hour  Intake --  Output 1950 ml  Net -1950 ml   Last 3 Weights 06/20/2020 06/20/2020 06/20/2020  Weight (lbs) 175 lb 0.7 oz 175 lb 197 lb 6 oz  Weight (kg) 79.4 kg 79.379 kg 89.529 kg      ECG    pending - Personally Reviewed  Physical Exam   GEN: No acute distress.   Neck: No JVD Cardiac: RRR, no murmurs, rubs, or gallops.  Respiratory: Clear to auscultation bilaterally. GI: Soft,  nontender, non-distended  MS: No edema; No deformity. Neuro:  Nonfocal  Psych: Normal affect   Labs    High Sensitivity Troponin:   Recent Labs  Lab 06/20/20 1446 06/21/20 0414  TROPONINIHS 667* 6,713*      Chemistry Recent Labs  Lab 06/20/20 1446 06/21/20 0414  NA 137 139  K 3.9 4.1  CL 97* 100  CO2 28 30  GLUCOSE 110* 105*  BUN 16 16  CREATININE 0.82 1.00  CALCIUM 9.5 9.0  PROT 7.1  --   ALBUMIN 4.6  --   AST 33  --   ALT 33  --   ALKPHOS 48  --   BILITOT 0.9  --   GFRNONAA >60 >60  ANIONGAP 12 9     Hematology Recent Labs  Lab 06/20/20 1446 06/21/20 0414  WBC 8.6 9.5  RBC 4.37 4.10*  HGB 14.2 13.7  HCT 42.9 40.1  MCV 98.2 97.8  MCH 32.5 33.4  MCHC 33.1 34.2  RDW 12.9 13.3  PLT 171 153    BNPNo results for input(s): BNP, PROBNP in the last  168 hours.   DDimer No results for input(s): DDIMER in the last 168 hours.   Radiology    CARDIAC CATHETERIZATION  Result Date: 06/20/2020 Conclusions: 1. Severe native CAD, including chronic total occlusions of the ostial LAD and RCA. 2. Patent LCx stent with mild to moderate in-stent restenosis. 3. Widely patent LIMA-LAD. 4. Patent SVG-OM2 with 60-70% ostial/proximal graft stenosis. 5. Subtotally occluded SVG-RCA with long segment of proximal graft disease of up to 80% and 99% stenosis at the distal anastomosis with TIMI-1 flow. 6. Moderately reduced left ventricular contraction with inferior akinesis.  Normal left ventricular filling pressure. 7. Successful PCI to SVG-RCA with overlapping Resolute Onyx 4.0 x 26 mm and 4.0 x 38 mm proximal and 2.5 x 18 mm distal (extending into rPDA) drug-eluting stents.  Recommendations: 1. Continue tirofiban infusion x 6 hours. 2. Dual antiplatelet therapy with aspirin and tigagrelor for at least 12 months. 3. Aggressive secondary prevention. 4. If patient has recurrent chest pain, consider PCI to SVG-OM2. 5. Follow-up echocardiogram. Nelva Bush, MD [facility]  DG Chest  Portable 1 View  Result Date: 06/20/2020 CLINICAL DATA:  Chest pain and EKG findings suspicious for MI EXAM: PORTABLE CHEST 1 VIEW COMPARISON:  12/11/2019 FINDINGS: Cardiac shadow is stable. Postsurgical changes are again seen. Lungs are well aerated bilaterally without focal infiltrate or sizable effusion no acute bony abnormality is seen. Previously seen nodular changes on the left are not as well appreciated on today's exam. IMPRESSION: No acute abnormality noted. Electronically Signed   By: Inez Catalina M.D.   On: 06/20/2020 15:30    Cardiac Studies   Echo ordered  Cardiac cath 06/20/20 Conclusion  Conclusions: 1. Severe native CAD, including chronic total occlusions of the ostial LAD and RCA. 2. Patent LCx stent with mild to moderate in-stent restenosis. 3. Widely patent LIMA-LAD. 4. Patent SVG-OM2 with 60-70% ostial/proximal graft stenosis. 5. Subtotally occluded SVG-RCA with long segment of proximal graft disease of up to 80% and 99% stenosis at the distal anastomosis with TIMI-1 flow. 6. Moderately reduced left ventricular contraction with inferior akinesis. Normal left ventricular filling pressure. 7. Successful PCI to SVG-RCA with overlapping Resolute Onyx 4.0 x 26 mm and 4.0 x 38 mm proximal and 2.5 x 18 mm distal (extending into rPDA) drug-eluting stents.  Recommendations: 1. Continue tirofiban infusion x 6 hours. 2. Dual antiplatelet therapy with aspirin and tigagrelor for at least 12 months. 3. Aggressive secondary prevention. 4. If patient has recurrent chest pain, consider PCI to SVG-OM2. 5. Follow-up echocardiogram.  Nelva Bush, MD [facility]  Coronary Diagrams   Diagnostic Dominance: Right    Intervention     Patient Profile     70 y.o. male with pmh CAD s/p CABG in 1993 and redo in 0355, chronic systolic heart failure   Assessment & Plan    NSTEMI CAD s/p redo CABG in 2007 - cath in august showed 2021 severe underlying 3V disease with  patent grafts and no significant change since 2017.  - he was seen in the clinic 3/1 and was having active chest pain. He was given 1 SL NTG. EKG showed minor ST inferior with reciprocal. The patient was brought up to the cath lab for cardiac cath.  - Troponin elevated 6713 - cath showed CTO ostial LAD and RCA, mild to mo ISR LCx stent, widely patent LIMA-LAD, patent SVG-OM2 with 60-70% ostial/proxmoal graft stenosis, subtotally occluded SVG-RCA with long segment proximal graft disease 80-99% with TIMI flow 1, mod reduced LVEF with inf  akinesis. He was treated with successful PCI to SVG-RCA with overlapping stents x2.  - DAPT with aspirin and Brilinta for 12 months - If patient has recurrent chest pain consider PCI to SVG-OM2 - Echo results pending - cath site, left wrist, stable with no complications - Patient had some sharp chest pain overnight, but nothing similar to prior episode.  - Scr and Hgb stable  Chronic systolic heart failure - EF 35-40% - Repat echo ordered  HLD - Rosuvastatin - LDL 61  Mild carotid disease - previous carotid doppler showed less than 40% stenosis bilaterally. No plan to repeat    Signed, Cadence Ninfa Meeker, PA-C  06/21/2020, 12:52 PM      Physician addendum  Patient seen and examined, case discussed during rounds today.  Briefly he is a 70 year old gentleman with history of CAD/CABG x3.  Presenting with chest pain, underwent left heart cath showing significant stenosis of SVG to RCA.  Status post PCI x3 to SVG-RCA.  States symptoms of chest discomfort is much improved.  He is a little bit wobbly with regards to his gait.  Echo with EF 35 to 40%.  Plan Aspirin, Brilinta x12 months.  Continue Coreg, losartan, Crestor.  Recommend ambulation with physical therapy, okay to transfer to stepdown/telemetry unit.  If patient stays symptom free can be discharged tomorrow on current cardiac meds  Signed, Kate Sable, M.D. 06/21/20 [place], [place] .

## 2020-06-21 NOTE — Progress Notes (Signed)
TR band removed @ 3005. Dry guaze dressing placed. No issues w/ bleeding after band removal.   Pt voiding adequately overnight, 1.5 L out.   Pt placed on CPAP while sleeping overnight.  NS and Aggrastat gtt's stopped @ 0100 per order.   Pt ox4, calm and cooperative. Pt in no acute distress @ this time. Will continue to monitor.

## 2020-06-21 NOTE — Progress Notes (Addendum)
PROGRESS NOTE    Willie Clark  MGN:[id number] DOB: 1951/04/15 DOA: 06/20/2020 PCP: [name], MD    Brief Narrative:  Willie Clark is a 70 y.o. male with medical history significant of CAD status post MI and bypass, CHF, depression/anxiety, hypertension, hyperlipidemia, intermittent claudication, obstructive uropathy, TIA, diverticulosis, GERD/Barrett's who presents with chest pain.  3/2-s/p cath on 3/1. This am with chest tightness but no pain or sob  Consultants:   cardiology  Procedures: s/p cath  Antimicrobials:       Subjective: Sitting inchair, no shortness of breath or chest pain.  Feels a little chest tightness only.  Objective: Vitals:   06/21/20 1200 06/21/20 1300 06/21/20 1400 06/21/20 1500  BP: 116/84 140/82 116/69 111/71  Pulse: 74 75 64 (!) 59  Resp: 16 16 15 15   Temp: 98.1 F (36.7 C)     TempSrc: Oral     SpO2: 98% 98% 100% 96%  Weight:      Height:        Intake/Output Summary (Last 24 hours) at 06/21/2020 1749 Last data filed at 06/21/2020 0737 Gross per 24 hour  Intake --  Output 1950 ml  Net -1950 ml   Filed Weights   06/20/20 1442 06/20/20 1815  Weight: 79.4 kg 79.4 kg    Examination:  General exam: Appears calm and comfortable  Respiratory system: Clear to auscultation. Respiratory effort normal. Cardiovascular system: S1 & S2 heard, RRR. No JVD, murmurs, rubs, gallops or clicks.  Gastrointestinal system: Abdomen is nondistended, soft and nontender.. Normal bowel sounds heard. Central nervous system: Alert and oriented. No focal neurological deficits. Extremities: No edema Skin: Warm dry Psychiatry: Judgement and insight appear normal. Mood & affect appropriate.     Data Reviewed: I have personally reviewed following labs and imaging studies  CBC: Recent Labs  Lab 06/20/20 1446 06/21/20 0414  WBC 8.6 9.5  NEUTROABS 5.6  --   HGB 14.2 13.7  HCT 42.9 40.1  MCV 98.2 97.8  PLT 171 916   Basic Metabolic  Panel: Recent Labs  Lab 06/20/20 1446 06/21/20 0414  NA 137 139  K 3.9 4.1  CL 97* 100  CO2 28 30  GLUCOSE 110* 105*  BUN 16 16  CREATININE 0.82 1.00  CALCIUM 9.5 9.0   GFR: Estimated Creatinine Clearance: 69.7 mL/min (by C-G formula based on SCr of 1 mg/dL). Liver Function Tests: Recent Labs  Lab 06/20/20 1446  AST 33  ALT 33  ALKPHOS 48  BILITOT 0.9  PROT 7.1  ALBUMIN 4.6   No results for input(s): LIPASE, AMYLASE in the last 168 hours. No results for input(s): AMMONIA in the last 168 hours. Coagulation Profile: No results for input(s): INR, PROTIME in the last 168 hours. Cardiac Enzymes: No results for input(s): CKTOTAL, CKMB, CKMBINDEX, TROPONINI in the last 168 hours. BNP (last 3 results) No results for input(s): PROBNP in the last 8760 hours. HbA1C: No results for input(s): HGBA1C in the last 72 hours. CBG: Recent Labs  Lab 06/20/20 1812  GLUCAP 81   Lipid Profile: No results for input(s): CHOL, HDL, LDLCALC, TRIG, CHOLHDL, LDLDIRECT in the last 72 hours. Thyroid Function Tests: No results for input(s): TSH, T4TOTAL, FREET4, T3FREE, THYROIDAB in the last 72 hours. Anemia Panel: No results for input(s): VITAMINB12, FOLATE, FERRITIN, TIBC, IRON, RETICCTPCT in the last 72 hours. Sepsis Labs: No results for input(s): PROCALCITON, LATICACIDVEN in the last 168 hours.  Recent Results (from the past 240 hour(s))  MRSA PCR Screening  Status: None   Collection Time: 06/20/20  2:40 PM   Specimen: Nasopharyngeal  Result Value Ref Range Status   MRSA by PCR NEGATIVE NEGATIVE Final    Comment:        The GeneXpert MRSA Assay (FDA approved for NASAL specimens only), is one component of a comprehensive MRSA colonization surveillance program. It is not intended to diagnose MRSA infection nor to guide or monitor treatment for MRSA infections. Performed at [facility], [address] [place], [place] [address]   Resp Panel by RT-PCR (Flu A&B,  Covid) Nasopharyngeal Swab     Status: None   Collection Time: 06/20/20  2:46 PM   Specimen: Nasopharyngeal Swab; Nasopharyngeal(NP) swabs in vial transport medium  Result Value Ref Range Status   SARS Coronavirus 2 by RT PCR NEGATIVE NEGATIVE Final    Comment: (NOTE) SARS-CoV-2 target nucleic acids are NOT DETECTED.  The SARS-CoV-2 RNA is generally detectable in upper respiratory specimens during the acute phase of infection. The lowest concentration of SARS-CoV-2 viral copies this assay can detect is 138 copies/mL. A negative result does not preclude SARS-Cov-2 infection and should not be used as the sole basis for treatment or other patient management decisions. A negative result may occur with  improper specimen collection/handling, submission of specimen other than nasopharyngeal swab, presence of viral mutation(s) within the areas targeted by this assay, and inadequate number of viral copies(<138 copies/mL). A negative result must be combined with clinical observations, patient history, and epidemiological information. The expected result is Negative.  Fact Sheet for Patients:  EntrepreneurPulse.com.au  Fact Sheet for Healthcare Providers:  IncredibleEmployment.be  This test is no t yet approved or cleared by the [place] FDA and  has been authorized for detection and/or diagnosis of SARS-CoV-2 by FDA under an Emergency Use Authorization (EUA). This EUA will remain  in effect (meaning this test can be used) for the duration of the COVID-19 declaration under Section 564(b)(1) of the Act, 21 U.S.C.section 360bbb-3(b)(1), unless the authorization is terminated  or revoked sooner.       Influenza A by PCR NEGATIVE NEGATIVE Final   Influenza B by PCR NEGATIVE NEGATIVE Final    Comment: (NOTE) The Xpert Xpress SARS-CoV-2/FLU/RSV plus assay is intended as an aid in the diagnosis of influenza from Nasopharyngeal swab specimens and should  not be used as a sole basis for treatment. Nasal washings and aspirates are unacceptable for Xpert Xpress SARS-CoV-2/FLU/RSV testing.  Fact Sheet for Patients: EntrepreneurPulse.com.au  Fact Sheet for Healthcare Providers: IncredibleEmployment.be  This test is not yet approved or cleared by the [place] FDA and has been authorized for detection and/or diagnosis of SARS-CoV-2 by FDA under an Emergency Use Authorization (EUA). This EUA will remain in effect (meaning this test can be used) for the duration of the COVID-19 declaration under Section 564(b)(1) of the Act, 21 U.S.C. section 360bbb-3(b)(1), unless the authorization is terminated or revoked.  Performed at [facility], [address], [place], [place] [address]          Radiology Studies: CARDIAC CATHETERIZATION  Result Date: 06/20/2020 Conclusions: 1. Severe native CAD, including chronic total occlusions of the ostial LAD and RCA. 2. Patent LCx stent with mild to moderate in-stent restenosis. 3. Widely patent LIMA-LAD. 4. Patent SVG-OM2 with 60-70% ostial/proximal graft stenosis. 5. Subtotally occluded SVG-RCA with long segment of proximal graft disease of up to 80% and 99% stenosis at the distal anastomosis with TIMI-1 flow. 6. Moderately reduced left ventricular contraction with  inferior akinesis.  Normal left ventricular filling pressure. 7. Successful PCI to SVG-RCA with overlapping Resolute Onyx 4.0 x 26 mm and 4.0 x 38 mm proximal and 2.5 x 18 mm distal (extending into rPDA) drug-eluting stents.  Recommendations: 1. Continue tirofiban infusion x 6 hours. 2. Dual antiplatelet therapy with aspirin and tigagrelor for at least 12 months. 3. Aggressive secondary prevention. 4. If patient has recurrent chest pain, consider PCI to SVG-OM2. 5. Follow-up echocardiogram. Nelva Bush, MD [facility]  DG Chest Portable 1 View  Result Date: 06/20/2020 CLINICAL DATA:  Chest  pain and EKG findings suspicious for MI EXAM: PORTABLE CHEST 1 VIEW COMPARISON:  12/11/2019 FINDINGS: Cardiac shadow is stable. Postsurgical changes are again seen. Lungs are well aerated bilaterally without focal infiltrate or sizable effusion no acute bony abnormality is seen. Previously seen nodular changes on the left are not as well appreciated on today's exam. IMPRESSION: No acute abnormality noted. Electronically Signed   By: Inez Catalina M.D.   On: 06/20/2020 15:30   ECHOCARDIOGRAM COMPLETE  Result Date: 06/21/2020    ECHOCARDIOGRAM REPORT   Patient Name:   Willie Clark Date of Exam: 06/20/2020 Medical Rec #:  [id number]     Height:       69.0 in Accession #:    [id number]    Weight:       175.0 lb Date of Birth:  1950/09/21     BSA:          1.952 m Patient Age:    64 years      BP:           131/94 mmHg Patient Gender: M             HR:           62 bpm. Exam Location:  [facility] Procedure: 2D Echo, Cardiac Doppler, Color Doppler and Intracardiac            Opacification Agent Indications:     I21.9 Acute Myocardial Infarction.  History:         Patient has prior history of Echocardiogram examinations, most                  recent 12/12/2019. Risk Factors:Hypertension and Dyslipidemia.                  Syncope and collapse. Pulmonary embolism. Obstructive sleep                  apnea. Myocardial infarction. Coronary artery disease. Chest                  pain.  Sonographer:     Wilford Sports Rodgers-Jones Referring Phys:  [id number] CHRISTOPHER END Diagnosing Phys: Kate Sable MD IMPRESSIONS  1. Left ventricular ejection fraction, by estimation, is 35 to 40%. The left ventricle has moderately decreased function. The left ventricle demonstrates regional wall motion abnormalities (see scoring diagram/findings for description). Left ventricular  diastolic parameters are consistent with Grade I diastolic dysfunction (impaired relaxation). There is severe hypokinesis of the left ventricular, entire inferolateral wall.   2. Right ventricular systolic function is low normal. The right ventricular size is normal.  3. The mitral valve is normal in structure. No evidence of mitral valve regurgitation.  4. The aortic valve is tricuspid. Aortic valve regurgitation is mild.  5. The inferior vena cava is normal in size with greater than 50% respiratory variability, suggesting right atrial pressure of 3 mmHg. FINDINGS  Left Ventricle: Left  ventricular ejection fraction, by estimation, is 35 to 40%. The left ventricle has moderately decreased function. The left ventricle demonstrates regional wall motion abnormalities. Severe hypokinesis of the left ventricular, entire  inferolateral wall. Definity contrast agent was given IV to delineate the left ventricular endocardial borders. The left ventricular internal cavity size was normal in size. There is no left ventricular hypertrophy. Left ventricular diastolic parameters  are consistent with Grade I diastolic dysfunction (impaired relaxation). Right Ventricle: The right ventricular size is normal. No increase in right ventricular wall thickness. Right ventricular systolic function is low normal. Left Atrium: Left atrial size was normal in size. Right Atrium: Right atrial size was normal in size. Pericardium: There is no evidence of pericardial effusion. Mitral Valve: The mitral valve is normal in structure. No evidence of mitral valve regurgitation. Tricuspid Valve: The tricuspid valve is normal in structure. Tricuspid valve regurgitation is not demonstrated. Aortic Valve: The aortic valve is tricuspid. Aortic valve regurgitation is mild. Pulmonic Valve: The pulmonic valve was normal in structure. Pulmonic valve regurgitation is not visualized. Aorta: The aortic root is normal in size and structure. Venous: The inferior vena cava is normal in size with greater than 50% respiratory variability, suggesting right atrial pressure of 3 mmHg. IAS/Shunts: No atrial level shunt detected by color flow  Doppler.  LEFT VENTRICLE PLAX 2D LVIDd:         5.72 cm  Diastology LVIDs:         4.02 cm  LV e' medial:    5.87 cm/s LV PW:         0.98 cm  LV E/e' medial:  8.1 LV IVS:        0.71 cm  LV e' lateral:   8.49 cm/s LVOT diam:     2.00 cm  LV E/e' lateral: 5.6 LV SV:         38 LV SV Index:   20 LVOT Area:     3.14 cm  RIGHT VENTRICLE RV Basal diam:  4.06 cm RV S prime:     8.95 cm/s TAPSE (M-mode): 1.6 cm LEFT ATRIUM             Index       RIGHT ATRIUM           Index LA diam:        4.20 cm 2.15 cm/m  RA Area:     13.40 cm LA Vol (A2C):   45.5 ml 23.31 ml/m RA Volume:   38.10 ml  19.52 ml/m LA Vol (A4C):   39.9 ml 20.44 ml/m LA Biplane Vol: 46.0 ml 23.56 ml/m  AORTIC VALVE LVOT Vmax:   60.95 cm/s LVOT Vmean:  43.400 cm/s LVOT VTI:    0.122 m  AORTA Ao Root diam: 3.70 cm Ao Asc diam:  3.60 cm MITRAL VALVE MV Area (PHT): 3.37 cm    SHUNTS MV Decel Time: 225 msec    Systemic VTI:  0.12 m MV E velocity: 47.60 cm/s  Systemic Diam: 2.00 cm MV A velocity: 71.60 cm/s MV E/A ratio:  0.66 Kate Sable MD Electronically signed by Kate Sable MD Signature Date/Time: 06/21/2020/2:16:46 PM    Final         Scheduled Meds: . aspirin EC  81 mg Oral Daily  . carvedilol  3.125 mg Oral BID WC  . Chlorhexidine Gluconate Cloth  6 each Topical Daily  . citalopram  20 mg Oral Daily  . docusate sodium  100 mg Oral BID  .  enoxaparin (LOVENOX) injection  40 mg Subcutaneous Q24H  . finasteride  5 mg Oral Daily  . gabapentin  800 mg Oral TID  . icosapent Ethyl  2 g Oral BID  . losartan  25 mg Oral Daily  . pantoprazole  40 mg Oral BID  . psyllium  1 packet Oral Daily  . rOPINIRole  0.5 mg Oral QHS  . rosuvastatin  40 mg Oral Daily  . sodium chloride flush  3 mL Intravenous Q12H  . spironolactone  12.5 mg Oral Daily  . ticagrelor  90 mg Oral BID  . vitamin B-12  1,000 mcg Oral Daily   Continuous Infusions: . sodium chloride    . sodium chloride      Assessment & Plan:   Principal Problem:    Acute coronary syndrome [facility]) Active Problems:   Coronary artery disease involving native coronary artery of native heart with angina pectoris (HCC)   Hyperlipidemia   Intermittent claudication (HCC)   TIA (transient ischemic attack)   Obstructive uropathy   Chronic combined systolic and diastolic congestive heart failure (HCC)   Essential hypertension   NSTEMI (non-ST elevated myocardial infarction) (HCC)   GERD (gastroesophageal reflux disease)   HFrEF (heart failure with reduced ejection fraction) ([place])   ACS  Hyperlipidemia CAD status post MI and bypass in 2007 > Patient already seen by cardiology and as per HPI has already been taken for cath with stents placed in his SVG-RCA graft. - 3/2-status post cardiac cath.  EKG was found with minor ST inferiorly reciprocal.   Status post PCI to the SVG-RCA with overlapping stents x2  DAPT with aspirin and Brilinta for 12 months  If patient continues to have recurrent chest pain will consider PCI to SVG-OM2  Echo pending Cards following Continue home Coreg, losartan, rosuvastatin    CHF Hypertension > Last echo in 2021 with EF 40-45%, G1DD 3/2-repeat echo pending Holding Lasix entheses on 40 mg daily at home Continue Coreg, losartan, spironolactone  Depression/anxiety/sleep - Continue ropinirole and Neurontin home dose    Obstructive uropathy Continue finasteride  TIA Intermittent claudication - On dual antiplatelet therapy and statin as above  GERD  - Continue PPI   DVT prophylaxis: Enoxaparin Code Status: Full Family Communication: Wife at bedside Disposition Plan: DC when cleared by cardiology Status is: Inpatient  Remains inpatient appropriate because:Inpatient level of care appropriate due to severity of illness   Dispo: The patient is from: Home              Anticipated d/c is to: Home              Patient currently is not medically stable to d/c.   Difficult to place patient No             LOS: 1 day   Time spent: 35 minutes with more than 50% on Roberts, MD [facility] Pager [phone]  If 7PM-7AM, please contact night-coverage 06/21/2020, 5:49 PM

## 2020-06-22 ENCOUNTER — Ambulatory Visit: Payer: Medicare HMO | Admitting: Cardiovascular Disease

## 2020-06-22 ENCOUNTER — Other Ambulatory Visit: Payer: Self-pay | Admitting: Cardiovascular Disease

## 2020-06-22 ENCOUNTER — Telehealth: Payer: Self-pay | Admitting: *Deleted

## 2020-06-22 DIAGNOSIS — I214 Non-ST elevation (NSTEMI) myocardial infarction: Secondary | ICD-10-CM

## 2020-06-22 DIAGNOSIS — I10 Essential (primary) hypertension: Secondary | ICD-10-CM

## 2020-06-22 DIAGNOSIS — I255 Ischemic cardiomyopathy: Secondary | ICD-10-CM

## 2020-06-22 MED ORDER — NITROGLYCERIN 0.4 MG SL SUBL: 0.4000 mg | SUBLINGUAL_TABLET | SUBLINGUAL | 1 refills | Status: DC | PRN

## 2020-06-22 MED ORDER — TICAGRELOR 90 MG PO TABS: 90.0000 mg | ORAL_TABLET | Freq: Two times a day (BID) | ORAL | 0 refills | Status: DC

## 2020-06-22 MED ORDER — NITROGLYCERIN 0.4 MG SL SUBL: 0.4000 mg | SUBLINGUAL_TABLET | SUBLINGUAL | 1 refills | Status: AC | PRN

## 2020-06-22 MED ORDER — CARVEDILOL 3.125 MG PO TABS: 3.1250 mg | ORAL_TABLET | Freq: Two times a day (BID) | ORAL | 0 refills | Status: DC

## 2020-06-22 MED ORDER — ASPIRIN 81 MG PO TBEC: 81.0000 mg | DELAYED_RELEASE_TABLET | Freq: Every day | ORAL | 11 refills | Status: AC

## 2020-06-22 MED ORDER — LOSARTAN POTASSIUM 25 MG PO TABS: 25.0000 mg | ORAL_TABLET | Freq: Every day | ORAL | 0 refills | Status: DC

## 2020-06-22 NOTE — Plan of Care (Signed)

## 2020-06-22 NOTE — Telephone Encounter (Signed)
-----   Message from Elissa Hefty sent at 06/22/2020 10:04 AM EST ----- Regarding: TOC Patient is currently in ICU, [facility] appointment 3/10 with Ignacia Bayley, NP

## 2020-06-22 NOTE — Progress Notes (Signed)
Discharge instructions given to and reviewed with patient including changes to home meds, follow up appointments and new prescriptions. Brilinta discount card given to patient. Patient left ICU by wheelchair with Suezanne Jacquet, [place].  Patient alert with no distress noted.  Discharged home, daughter driving him home.

## 2020-06-22 NOTE — Progress Notes (Signed)
Pt remains on room air. No complaints of pain or discomfort overnight. Pt voiding and ambulating w/ assistance. Pt sleeping adequately overnight (Pt request NOT to wear cpap overnight d/t noise). Pt in no acute distress @ this time. Will continue to monitor.

## 2020-06-22 NOTE — Discharge Summary (Signed)
[name]:[id number] DOB: [birth year] DOA: 06/20/2020  PCP: [name], MD  Admit date: 06/20/2020 Discharge date: 06/22/2020  Admitted From: home Disposition:  home  Recommendations for Outpatient Follow-up:  1. Follow up with PCP in 1 week 2. Please obtain BMP/CBC in one week 3. Follow-up with cardiology in 1 to 2 weeks    Discharge Condition:Stable CODE STATUS:full  Diet recommendation: Heart Healthy  Brief/Interim Summary: Per HPI:  Willie Clark is a 70 y.o. male with medical history significant of CAD status post MI and bypass, CHF, depression/anxiety, hypertension, hyperlipidemia, intermittent claudication, obstructive uropathy, TIA, diverticulosis, GERD/Barrett's who presents with chest pain.  Patient's troponin was elevated.  Cardiology was consulted.  Patient is status post cardiac cath on 3/1.  ACS  Hyperlipidemia CAD status post MI and bypass in 2007 >Patient already seen by cardiology and as per HPI has already been taken for cath with stents placed in his SVG-RCA graft. -3/2-status post cardiac cath.  EKG was found with minor ST inferiorly reciprocal.   Status post PCI to the SVG-RCA with overlapping stents x2  DAPT with aspirin and Brilinta for 12 months  If patient continues to have recurrent chest pain will consider PCI to SVG-OM2  Echo 35-40% Cards following-discussed other medications for chronic stable angina including Imdur, Ranexa if needed these could be started as outpatient  Continue home Coreg, losartan, rosuvastatin, nitro sl.  Goal LDL <70     Chronic SHF/ICM Hypertension -Echocardiogram ejection fraction 35 to 40%  Ejection fraction with slight drop compared to echocardiogram August 2021  Continue carvedilol 3.125 twice daily losartan 25mg  Continue aldaconte    Depression/anxiety/sleep -Continue ropinirole and Neurontin home dose    Obstructive uropathy Continue finasteride  TIA Intermittent claudication -On dual  antiplatelet therapy and statin as above  GERD -Continue PPI    Discharge Diagnoses:  Principal Problem:   Acute coronary syndrome ([place]) Active Problems:   Coronary artery disease involving native coronary artery of native heart with angina pectoris (HCC)   Hyperlipidemia   Intermittent claudication (HCC)   TIA (transient ischemic attack)   Obstructive uropathy   Chronic combined systolic and diastolic congestive heart failure (HCC)   Essential hypertension   NSTEMI (non-ST elevated myocardial infarction) (HCC)   GERD (gastroesophageal reflux disease)   HFrEF (heart failure with reduced ejection fraction) [facility])    Discharge Instructions  Discharge Instructions    AMB Referral to Cardiac Rehabilitation - Phase II   Complete by: As directed    Diagnosis:  Coronary Stents NSTEMI     After initial evaluation and assessments completed: Virtual Based Care may be provided alone or in conjunction with Phase 2 Cardiac Rehab based on patient barriers.: Yes   Diet - low sodium heart healthy   Complete by: As directed    Discharge instructions   Complete by: As directed    Follow up with Dr. Fletcher Anon for further med management , refills, and medical management in 1 week   Increase activity slowly   Complete by: As directed      Allergies as of 06/22/2020      Reactions   Amitriptyline Other (See Comments)   Pt unsure of what happens Other reaction(s): Other (See Comments) Pt unsure of what happens Pt unsure of what happens   Cyclobenzaprine Other (See Comments), Hives   Pt unsure what hapens Pt unsure what hapens Pt unsure what hapens   Dicyclomine Shortness Of Breath, Swelling   Sacubitril-valsartan Anaphylaxis, Itching   Acetaminophen  Amitriptyline Hcl    Berniece Salines Flavor Diarrhea   Hydrocodone    Fish Oil Itching   Lidocaine Nausea And Vomiting, Other (See Comments), Nausea Only   Nasal Spray (only time had reaction) Other reaction(s): Dizziness Nasal Spray (only  time had reaction)   Omega-3 Fatty Acids-vitamin E Itching   Other Diarrhea, Itching   Reactive agents: Onions, bacon, steak   Oxycodone-acetaminophen Other (See Comments)   Lowers BP Other reaction(s): Other (See Comments) Lowers BP Lowers BP   Sulfa Antibiotics Itching      Medication List    STOP taking these medications   celecoxib 200 MG capsule Commonly known as: CELEBREX   furosemide 40 MG tablet Commonly known as: LASIX   potassium chloride SA 20 MEQ tablet Commonly known as: KLOR-CON     TAKE these medications   albuterol 108 (90 Base) MCG/ACT inhaler Commonly known as: VENTOLIN HFA Inhale into the lungs every 4 (four) hours as needed for wheezing or shortness of breath.   aspirin 81 MG EC tablet Take 1 tablet (81 mg total) by mouth daily. Swallow whole. Start taking on: June 23, 2020 What changed: additional instructions   carvedilol 3.125 MG tablet Commonly known as: COREG Take 1 tablet (3.125 mg total) by mouth 2 (two) times daily with a meal.   citalopram 20 MG tablet Commonly known as: CELEXA Take 20 mg by mouth daily.   dicyclomine 10 MG capsule Commonly known as: BENTYL Take 10 mg by mouth 3 (three) times daily.   EPINEPHrine 0.3 mg/0.3 mL Soaj injection Commonly known as: EPI-PEN Inject 0.3 mg into the muscle as needed.   Erenumab-aooe 140 MG/ML Soaj Inject 140 mg into the skin every 28 (twenty-eight) days.   finasteride 5 MG tablet Commonly known as: PROSCAR Take 5 mg by mouth daily.   gabapentin 800 MG tablet Commonly known as: NEURONTIN Take 1 tablet by mouth 3 (three) times daily.   icosapent Ethyl 1 g capsule Commonly known as: Vascepa Take 2 capsules (2 g total) by mouth 2 (two) times daily.   losartan 25 MG tablet Commonly known as: COZAAR Take 1 tablet (25 mg total) by mouth daily.   nitroGLYCERIN 0.4 MG SL tablet Commonly known as: NITROSTAT Place 1 tablet (0.4 mg total) under the tongue every 5 (five) minutes as needed  for chest pain.   pantoprazole 40 MG tablet Commonly known as: PROTONIX Take 40 mg by mouth 2 (two) times daily.   rOPINIRole 1 MG tablet Commonly known as: REQUIP Take 1 mg by mouth 2 (two) times daily.   rosuvastatin 40 MG tablet Commonly known as: CRESTOR Take 1 tablet (40 mg total) by mouth daily.   spironolactone 25 MG tablet Commonly known as: ALDACTONE Take 0.5 tablets (12.5 mg total) by mouth daily.   ticagrelor 90 MG Tabs tablet Commonly known as: BRILINTA Take 1 tablet (90 mg total) by mouth 2 (two) times daily.   vitamin B-12 1000 MCG tablet Commonly known as: CYANOCOBALAMIN Take 1,000 mcg by mouth daily.       Follow-up Information    Wellington Hampshire, MD Follow up.   Specialty: Cardiology Contact information: [place] [address] [phone]        [name], MD. Go on 06/28/2020.   Specialty: Internal Medicine Why: 4:00 pm Contact information: [place] [address] [phone]              Allergies  Allergen  Reactions  . Amitriptyline Other (See Comments)    Pt unsure of what happens Other reaction(s): Other (See Comments) Pt unsure of what happens Pt unsure of what happens  . Cyclobenzaprine Other (See Comments) and Hives    Pt unsure what hapens Pt unsure what hapens Pt unsure what hapens   . Dicyclomine Shortness Of Breath and Swelling  . Sacubitril-Valsartan Anaphylaxis and Itching  . Acetaminophen   . Amitriptyline Hcl   . Berniece Salines Flavor Diarrhea  . Hydrocodone   . Fish Oil Itching  . Lidocaine Nausea And Vomiting, Other (See Comments) and Nausea Only    Nasal Spray (only time had reaction) Other reaction(s): Dizziness Nasal Spray (only time had reaction)  . Omega-3 Fatty Acids-Vitamin E Itching  . Other Diarrhea and Itching    Reactive agents: Onions, bacon, steak  . Oxycodone-Acetaminophen Other (See Comments)    Lowers BP Other reaction(s): Other (See  Comments) Lowers BP Lowers BP  . Sulfa Antibiotics Itching    Consultations: cardiology  Procedures/Studies: CARDIAC CATHETERIZATION  Result Date: 06/20/2020 Conclusions: 1. Severe native CAD, including chronic total occlusions of the ostial LAD and RCA. 2. Patent LCx stent with mild to moderate in-stent restenosis. 3. Widely patent LIMA-LAD. 4. Patent SVG-OM2 with 60-70% ostial/proximal graft stenosis. 5. Subtotally occluded SVG-RCA with long segment of proximal graft disease of up to 80% and 99% stenosis at the distal anastomosis with TIMI-1 flow. 6. Moderately reduced left ventricular contraction with inferior akinesis.  Normal left ventricular filling pressure. 7. Successful PCI to SVG-RCA with overlapping Resolute Onyx 4.0 x 26 mm and 4.0 x 38 mm proximal and 2.5 x 18 mm distal (extending into rPDA) drug-eluting stents.  Recommendations: 1. Continue tirofiban infusion x 6 hours. 2. Dual antiplatelet therapy with aspirin and tigagrelor for at least 12 months. 3. Aggressive secondary prevention. 4. If patient has recurrent chest pain, consider PCI to SVG-OM2. 5. Follow-up echocardiogram. Nelva Bush, MD [facility]  DG Chest Portable 1 View  Result Date: 06/20/2020 CLINICAL DATA:  Chest pain and EKG findings suspicious for MI EXAM: PORTABLE CHEST 1 VIEW COMPARISON:  12/11/2019 FINDINGS: Cardiac shadow is stable. Postsurgical changes are again seen. Lungs are well aerated bilaterally without focal infiltrate or sizable effusion no acute bony abnormality is seen. Previously seen nodular changes on the left are not as well appreciated on today's exam. IMPRESSION: No acute abnormality noted. Electronically Signed   By: Inez Catalina M.D.   On: 06/20/2020 15:30   ECHOCARDIOGRAM COMPLETE  Result Date: 06/21/2020    ECHOCARDIOGRAM REPORT   Patient Name:   Willie Clark Date of Exam: 06/20/2020 Medical Rec #:  [id number]     Height:       69.0 in Accession #:    [id number]    Weight:       175.0 lb  Date of Birth:  1951-04-15     BSA:          1.952 m Patient Age:    56 years      BP:           131/94 mmHg Patient Gender: M             HR:           62 bpm. Exam Location:  [facility] Procedure: 2D Echo, Cardiac Doppler, Color Doppler and Intracardiac            Opacification Agent Indications:     I21.9 Acute Myocardial Infarction.  History:  Patient has prior history of Echocardiogram examinations, most                  recent 12/12/2019. Risk Factors:Hypertension and Dyslipidemia.                  Syncope and collapse. Pulmonary embolism. Obstructive sleep                  apnea. Myocardial infarction. Coronary artery disease. Chest                  pain.  Sonographer:     Wilford Sports Rodgers-Jones Referring Phys:  [id number] CHRISTOPHER END Diagnosing Phys: Kate Sable MD IMPRESSIONS  1. Left ventricular ejection fraction, by estimation, is 35 to 40%. The left ventricle has moderately decreased function. The left ventricle demonstrates regional wall motion abnormalities (see scoring diagram/findings for description). Left ventricular  diastolic parameters are consistent with Grade I diastolic dysfunction (impaired relaxation). There is severe hypokinesis of the left ventricular, entire inferolateral wall.  2. Right ventricular systolic function is low normal. The right ventricular size is normal.  3. The mitral valve is normal in structure. No evidence of mitral valve regurgitation.  4. The aortic valve is tricuspid. Aortic valve regurgitation is mild.  5. The inferior vena cava is normal in size with greater than 50% respiratory variability, suggesting right atrial pressure of 3 mmHg. FINDINGS  Left Ventricle: Left ventricular ejection fraction, by estimation, is 35 to 40%. The left ventricle has moderately decreased function. The left ventricle demonstrates regional wall motion abnormalities. Severe hypokinesis of the left ventricular, entire  inferolateral wall. Definity contrast agent was given IV to  delineate the left ventricular endocardial borders. The left ventricular internal cavity size was normal in size. There is no left ventricular hypertrophy. Left ventricular diastolic parameters  are consistent with Grade I diastolic dysfunction (impaired relaxation). Right Ventricle: The right ventricular size is normal. No increase in right ventricular wall thickness. Right ventricular systolic function is low normal. Left Atrium: Left atrial size was normal in size. Right Atrium: Right atrial size was normal in size. Pericardium: There is no evidence of pericardial effusion. Mitral Valve: The mitral valve is normal in structure. No evidence of mitral valve regurgitation. Tricuspid Valve: The tricuspid valve is normal in structure. Tricuspid valve regurgitation is not demonstrated. Aortic Valve: The aortic valve is tricuspid. Aortic valve regurgitation is mild. Pulmonic Valve: The pulmonic valve was normal in structure. Pulmonic valve regurgitation is not visualized. Aorta: The aortic root is normal in size and structure. Venous: The inferior vena cava is normal in size with greater than 50% respiratory variability, suggesting right atrial pressure of 3 mmHg. IAS/Shunts: No atrial level shunt detected by color flow Doppler.  LEFT VENTRICLE PLAX 2D LVIDd:         5.72 cm  Diastology LVIDs:         4.02 cm  LV e' medial:    5.87 cm/s LV PW:         0.98 cm  LV E/e' medial:  8.1 LV IVS:        0.71 cm  LV e' lateral:   8.49 cm/s LVOT diam:     2.00 cm  LV E/e' lateral: 5.6 LV SV:         38 LV SV Index:   20 LVOT Area:     3.14 cm  RIGHT VENTRICLE RV Basal diam:  4.06 cm RV S prime:     8.95 cm/s  TAPSE (M-mode): 1.6 cm LEFT ATRIUM             Index       RIGHT ATRIUM           Index LA diam:        4.20 cm 2.15 cm/m  RA Area:     13.40 cm LA Vol (A2C):   45.5 ml 23.31 ml/m RA Volume:   38.10 ml  19.52 ml/m LA Vol (A4C):   39.9 ml 20.44 ml/m LA Biplane Vol: 46.0 ml 23.56 ml/m  AORTIC VALVE LVOT Vmax:   60.95  cm/s LVOT Vmean:  43.400 cm/s LVOT VTI:    0.122 m  AORTA Ao Root diam: 3.70 cm Ao Asc diam:  3.60 cm MITRAL VALVE MV Area (PHT): 3.37 cm    SHUNTS MV Decel Time: 225 msec    Systemic VTI:  0.12 m MV E velocity: 47.60 cm/s  Systemic Diam: 2.00 cm MV A velocity: 71.60 cm/s MV E/A ratio:  0.66 Kate Sable MD Electronically signed by Kate Sable MD Signature Date/Time: 06/21/2020/2:16:46 PM    Final       Subjective: No cp or sob  Discharge Exam: Vitals:   06/22/20 1014 06/22/20 1100  BP: (!) 136/93 126/79  Pulse: 76 76  Resp: 19 19  Temp:  98 F (36.7 C)  SpO2: 97% 95%   Vitals:   06/22/20 0834 06/22/20 0900 06/22/20 1014 06/22/20 1100  BP:   (!) 136/93 126/79  Pulse: 85 94 76 76  Resp:  (!) 24 19 19   Temp:    98 F (36.7 C)  TempSrc:    Oral  SpO2:  98% 97% 95%  Weight:      Height:        General: Pt is alert, awake, not in acute distress Cardiovascular: RRR, S1/S2 +, no rubs, no gallops Respiratory: CTA bilaterally, no wheezing, no rhonchi Abdominal: Soft, NT, ND, bowel sounds + Extremities: no edema    The results of significant diagnostics from this hospitalization (including imaging, microbiology, ancillary and laboratory) are listed below for reference.     Microbiology: Recent Results (from the past 240 hour(s))  MRSA PCR Screening     Status: None   Collection Time: 06/20/20  2:40 PM   Specimen: Nasopharyngeal  Result Value Ref Range Status   MRSA by PCR NEGATIVE NEGATIVE Final    Comment:        The GeneXpert MRSA Assay (FDA approved for NASAL specimens only), is one component of a comprehensive MRSA colonization surveillance program. It is not intended to diagnose MRSA infection nor to guide or monitor treatment for MRSA infections. Performed at [facility], [address] [place], [place] [address]   Resp Panel by RT-PCR (Flu A&B, Covid) Nasopharyngeal Swab     Status: None   Collection Time: 06/20/20  2:46 PM   Specimen:  Nasopharyngeal Swab; Nasopharyngeal(NP) swabs in vial transport medium  Result Value Ref Range Status   SARS Coronavirus 2 by RT PCR NEGATIVE NEGATIVE Final    Comment: (NOTE) SARS-CoV-2 target nucleic acids are NOT DETECTED.  The SARS-CoV-2 RNA is generally detectable in upper respiratory specimens during the acute phase of infection. The lowest concentration of SARS-CoV-2 viral copies this assay can detect is 138 copies/mL. A negative result does not preclude SARS-Cov-2 infection and should not be used as the sole basis for treatment or other patient management decisions. A negative result may occur with  improper specimen collection/handling, submission  of specimen other than nasopharyngeal swab, presence of viral mutation(s) within the areas targeted by this assay, and inadequate number of viral copies(<138 copies/mL). A negative result must be combined with clinical observations, patient history, and epidemiological information. The expected result is Negative.  Fact Sheet for Patients:  EntrepreneurPulse.com.au  Fact Sheet for Healthcare Providers:  IncredibleEmployment.be  This test is no t yet approved or cleared by the [place] FDA and  has been authorized for detection and/or diagnosis of SARS-CoV-2 by FDA under an Emergency Use Authorization (EUA). This EUA will remain  in effect (meaning this test can be used) for the duration of the COVID-19 declaration under Section 564(b)(1) of the Act, 21 U.S.C.section 360bbb-3(b)(1), unless the authorization is terminated  or revoked sooner.       Influenza A by PCR NEGATIVE NEGATIVE Final   Influenza B by PCR NEGATIVE NEGATIVE Final    Comment: (NOTE) The Xpert Xpress SARS-CoV-2/FLU/RSV plus assay is intended as an aid in the diagnosis of influenza from Nasopharyngeal swab specimens and should not be used as a sole basis for treatment. Nasal washings and aspirates are unacceptable for  Xpert Xpress SARS-CoV-2/FLU/RSV testing.  Fact Sheet for Patients: EntrepreneurPulse.com.au  Fact Sheet for Healthcare Providers: IncredibleEmployment.be  This test is not yet approved or cleared by the [place] FDA and has been authorized for detection and/or diagnosis of SARS-CoV-2 by FDA under an Emergency Use Authorization (EUA). This EUA will remain in effect (meaning this test can be used) for the duration of the COVID-19 declaration under Section 564(b)(1) of the Act, 21 U.S.C. section 360bbb-3(b)(1), unless the authorization is terminated or revoked.  Performed at [facility], [address] [place], [place] [address]      Labs: BNP (last 3 results) No results for input(s): BNP in the last 8760 hours. Basic Metabolic Panel: Recent Labs  Lab 06/20/20 1446 06/21/20 0414  NA 137 139  K 3.9 4.1  CL 97* 100  CO2 28 30  GLUCOSE 110* 105*  BUN 16 16  CREATININE 0.82 1.00  CALCIUM 9.5 9.0   Liver Function Tests: Recent Labs  Lab 06/20/20 1446  AST 33  ALT 33  ALKPHOS 48  BILITOT 0.9  PROT 7.1  ALBUMIN 4.6   No results for input(s): LIPASE, AMYLASE in the last 168 hours. No results for input(s): AMMONIA in the last 168 hours. CBC: Recent Labs  Lab 06/20/20 1446 06/21/20 0414  WBC 8.6 9.5  NEUTROABS 5.6  --   HGB 14.2 13.7  HCT 42.9 40.1  MCV 98.2 97.8  PLT 171 153   Cardiac Enzymes: No results for input(s): CKTOTAL, CKMB, CKMBINDEX, TROPONINI in the last 168 hours. BNP: Invalid input(s): POCBNP CBG: Recent Labs  Lab 06/20/20 1812  GLUCAP 81   D-Dimer No results for input(s): DDIMER in the last 72 hours. Hgb A1c No results for input(s): HGBA1C in the last 72 hours. Lipid Profile No results for input(s): CHOL, HDL, LDLCALC, TRIG, CHOLHDL, LDLDIRECT in the last 72 hours. Thyroid function studies No results for input(s): TSH, T4TOTAL, T3FREE, THYROIDAB in the last 72 hours.  Invalid  input(s): FREET3 Anemia work up No results for input(s): VITAMINB12, FOLATE, FERRITIN, TIBC, IRON, RETICCTPCT in the last 72 hours. Urinalysis    Component Value Date/Time   COLORURINE STRAW (A) 08/01/2018 0133   APPEARANCEUR CLEAR (A) 08/01/2018 0133   LABSPEC 1.010 08/01/2018 0133   PHURINE 7.0 08/01/2018 0133   GLUCOSEU NEGATIVE 08/01/2018 0133   HGBUR NEGATIVE 08/01/2018  [place] 08/01/2018 0133   KETONESUR NEGATIVE 08/01/2018 0133   PROTEINUR NEGATIVE 08/01/2018 0133   NITRITE NEGATIVE 08/01/2018 0133   LEUKOCYTESUR NEGATIVE 08/01/2018 0133   Sepsis Labs Invalid input(s): PROCALCITONIN,  WBC,  LACTICIDVEN Microbiology Recent Results (from the past 240 hour(s))  MRSA PCR Screening     Status: None   Collection Time: 06/20/20  2:40 PM   Specimen: Nasopharyngeal  Result Value Ref Range Status   MRSA by PCR NEGATIVE NEGATIVE Final    Comment:        The GeneXpert MRSA Assay (FDA approved for NASAL specimens only), is one component of a comprehensive MRSA colonization surveillance program. It is not intended to diagnose MRSA infection nor to guide or monitor treatment for MRSA infections. Performed at [facility] [address] [place], [place] [address]   Resp Panel by RT-PCR (Flu A&B, Covid) Nasopharyngeal Swab     Status: None   Collection Time: 06/20/20  2:46 PM   Specimen: Nasopharyngeal Swab; Nasopharyngeal(NP) swabs in vial transport medium  Result Value Ref Range Status   SARS Coronavirus 2 by RT PCR NEGATIVE NEGATIVE Final    Comment: (NOTE) SARS-CoV-2 target nucleic acids are NOT DETECTED.  The SARS-CoV-2 RNA is generally detectable in upper respiratory specimens during the acute phase of infection. The lowest concentration of SARS-CoV-2 viral copies this assay can detect is 138 copies/mL. A negative result does not preclude SARS-Cov-2 infection and should not be used as the sole basis for treatment or other patient  management decisions. A negative result may occur with  improper specimen collection/handling, submission of specimen other than nasopharyngeal swab, presence of viral mutation(s) within the areas targeted by this assay, and inadequate number of viral copies(<138 copies/mL). A negative result must be combined with clinical observations, patient history, and epidemiological information. The expected result is Negative.  Fact Sheet for Patients:  EntrepreneurPulse.com.au  Fact Sheet for Healthcare Providers:  IncredibleEmployment.be  This test is no t yet approved or cleared by the [place] FDA and  has been authorized for detection and/or diagnosis of SARS-CoV-2 by FDA under an Emergency Use Authorization (EUA). This EUA will remain  in effect (meaning this test can be used) for the duration of the COVID-19 declaration under Section 564(b)(1) of the Act, 21 U.S.C.section 360bbb-3(b)(1), unless the authorization is terminated  or revoked sooner.       Influenza A by PCR NEGATIVE NEGATIVE Final   Influenza B by PCR NEGATIVE NEGATIVE Final    Comment: (NOTE) The Xpert Xpress SARS-CoV-2/FLU/RSV plus assay is intended as an aid in the diagnosis of influenza from Nasopharyngeal swab specimens and should not be used as a sole basis for treatment. Nasal washings and aspirates are unacceptable for Xpert Xpress SARS-CoV-2/FLU/RSV testing.  Fact Sheet for Patients: EntrepreneurPulse.com.au  Fact Sheet for Healthcare Providers: IncredibleEmployment.be  This test is not yet approved or cleared by the [place] FDA and has been authorized for detection and/or diagnosis of SARS-CoV-2 by FDA under an Emergency Use Authorization (EUA). This EUA will remain in effect (meaning this test can be used) for the duration of the COVID-19 declaration under Section 564(b)(1) of the Act, 21 U.S.C. section 360bbb-3(b)(1),  unless the authorization is terminated or revoked.  Performed at [facility], [address] [place], [place] [address]      Time coordinating discharge: Over 30 minutes  SIGNED:   Nolberto Hanlon, MD  [facility] 06/22/2020, 5:38 PM Pager   If  7PM-7AM, please contact night-coverage [email or web address] Password [email or web address]

## 2020-06-22 NOTE — Progress Notes (Signed)
Ambulated around unit several times on tele and tolerated well. Denies chest pain and no shortness of breath.

## 2020-06-22 NOTE — Telephone Encounter (Signed)
  Patient contacted regarding discharge from [facility] on 06/22/20.  Patient understands to follow up with provider Ignacia Bayley NP on 06/29/20 at 10:05 AM at [facility]. Patient understands discharge instructions? Yes Patient understands medications and regiment? Yes Patient understands to bring all medications to this visit? Yes  Patient is enrolled in My Chart.  He verbalized understanding of appointment and request to arrive early to allow time to get down here to the office. No further questions at this time.

## 2020-06-22 NOTE — Progress Notes (Signed)
Progress Note  Patient Name: Willie Clark Date of Encounter: 06/22/2020  Primary Cardiologist: [name], MD  Subjective   Mild, intermittent chest tightness last night.  This is common for him as he has some degree of chronic c/p, and he notes that he feels back to baseline.  No dyspnea.  Eager to go home.  Inpatient Medications    Scheduled Meds: . aspirin EC  81 mg Oral Daily  . carvedilol  3.125 mg Oral BID WC  . Chlorhexidine Gluconate Cloth  6 each Topical Daily  . citalopram  20 mg Oral Daily  . docusate sodium  100 mg Oral BID  . enoxaparin (LOVENOX) injection  40 mg Subcutaneous Q24H  . finasteride  5 mg Oral Daily  . gabapentin  800 mg Oral TID  . icosapent Ethyl  2 g Oral BID  . losartan  25 mg Oral Daily  . pantoprazole  40 mg Oral BID  . psyllium  1 packet Oral Daily  . rOPINIRole  0.5 mg Oral QHS  . rosuvastatin  40 mg Oral Daily  . sodium chloride flush  3 mL Intravenous Q12H  . spironolactone  12.5 mg Oral Daily  . ticagrelor  90 mg Oral BID  . vitamin B-12  1,000 mcg Oral Daily   Continuous Infusions: . sodium chloride    . sodium chloride     PRN Meds: sodium chloride, sodium chloride, albuterol, nitroGLYCERIN, ondansetron (ZOFRAN) IV, sodium chloride flush   Vital Signs    Vitals:   06/22/20 0400 06/22/20 0500 06/22/20 0600 06/22/20 0700  BP: 110/62 119/83 105/67 136/81  Pulse: 81 64 63 74  Resp: 18 12 13 15   Temp: 97.9 F (36.6 C)     TempSrc: Axillary     SpO2: 97% 96% 98% 95%  Weight:      Height:        Intake/Output Summary (Last 24 hours) at 06/22/2020 0802 Last data filed at 06/22/2020 0200 Gross per 24 hour  Intake --  Output 700 ml  Net -700 ml   Filed Weights   06/20/20 1442 06/20/20 1815  Weight: 79.4 kg 79.4 kg    Physical Exam   GEN: Well nourished, well developed, in no acute distress.  HEENT: Grossly normal.  Neck: Supple, no JVD, carotid bruits, or masses. Cardiac: RRR, no murmurs, rubs, or gallops. No  clubbing, cyanosis, edema.  Radials 2+, DP/PT 1+ and equal bilaterally. L radial cath site w/o bleeding/bruit/hematoma. Respiratory:  Respirations regular and unlabored, clear to auscultation bilaterally. GI: Soft, nontender, nondistended, BS + x 4. MS: no deformity or atrophy. Skin: warm and dry, no rash. Neuro:  Strength and sensation are intact. Psych: AAOx3.  Normal affect.  Labs    Chemistry Recent Labs  Lab 06/20/20 1446 06/21/20 0414  NA 137 139  K 3.9 4.1  CL 97* 100  CO2 28 30  GLUCOSE 110* 105*  BUN 16 16  CREATININE 0.82 1.00  CALCIUM 9.5 9.0  PROT 7.1  --   ALBUMIN 4.6  --   AST 33  --   ALT 33  --   ALKPHOS 48  --   BILITOT 0.9  --   GFRNONAA >60 >60  ANIONGAP 12 9     Hematology Recent Labs  Lab 06/20/20 1446 06/21/20 0414  WBC 8.6 9.5  RBC 4.37 4.10*  HGB 14.2 13.7  HCT 42.9 40.1  MCV 98.2 97.8  MCH 32.5 33.4  MCHC 33.1 34.2  RDW 12.9  13.3  PLT 171 153    Cardiac Enzymes  Recent Labs  Lab 06/20/20 1446 06/21/20 0414 06/21/20 1409  TROPONINIHS 667* 6,713* 6,017*       Lipids  Lab Results  Component Value Date   CHOL 165 02/07/2020   HDL 41 02/07/2020   LDLCALC 61 02/07/2020   TRIG 317 (H) 02/07/2020   CHOLHDL 4.0 02/07/2020    HbA1c  Lab Results  Component Value Date   HGBA1C 6.0 (H) 12/11/2019    Radiology    DG Chest Portable 1 View  Result Date: 06/20/2020 CLINICAL DATA:  Chest pain and EKG findings suspicious for MI EXAM: PORTABLE CHEST 1 VIEW COMPARISON:  12/11/2019 FINDINGS: Cardiac shadow is stable. Postsurgical changes are again seen. Lungs are well aerated bilaterally without focal infiltrate or sizable effusion no acute bony abnormality is seen. Previously seen nodular changes on the left are not as well appreciated on today's exam. IMPRESSION: No acute abnormality noted. Electronically Signed   By: Inez Catalina M.D.   On: 06/20/2020 15:30   Telemetry    RSR, occas pvcs - Personally Reviewed  Cardiac Studies    Cardiac Catheterization and Percutaneous Coronary Intervention 3.1.2022  Diagnostic Dominance: Right    Intervention     _____________  2D Echocardiogram 3.1.2022   1. Left ventricular ejection fraction, by estimation, is 35 to 40%. The  left ventricle has moderately decreased function. The left ventricle  demonstrates regional wall motion abnormalities (see scoring  diagram/findings for description). Left ventricular   diastolic parameters are consistent with Grade I diastolic dysfunction  (impaired relaxation). There is severe hypokinesis of the left  ventricular, entire inferolateral wall.   2. Right ventricular systolic function is low normal. The right  ventricular size is normal.   3. The mitral valve is normal in structure. No evidence of mitral valve  regurgitation.   4. The aortic valve is tricuspid. Aortic valve regurgitation is mild.   5. The inferior vena cava is normal in size with greater than 50%  respiratory variability, suggesting right atrial pressure of 3 mmHg.    Patient Profile     70 y.o. male w/ a h/o CAD s/p CABG and redo CABG (2007), HFrEF, ICM, chronic chest pain, HTN, HL, prior stroke, and OSA, who was admitted from the office on 06/20/2020 in the setting of chest pain and inferior STEMI.  Assessment & Plan    1.  Acute inferior STEMI/CAD: Presented to office 3/1 w/ chest pain and found to have inf ST elevation  code STEMI  cath  severe ostial/proximal and distal VG  RPDA dzs, now s/p PCI/DES x 3. EF down to 35-05% (was 40-45% 11/2019).  Peak HsTrop 6713.  Mild intermittent chest tightness overnight, which is his baseline (h/o fibromyalgia and chronic c/p).  No dyspnea.  Has ambulated some in room w/o recurrent symptoms.  Plans to participate in outpt cardiac rehab.  Cont asa,  blocker, brilinta, statin, and vascepa.  F/u w/ [place] in 1 week.  2.  HFrEF/ICM:  EF 35-40% by echo this admission.  Euvolemic on exam.  Cont  blocker, ARB, spiro.  Can  consider transition to entresto or addition of farxiga, though he notes that this will likely be cost prohibitive.  3.  Essential HTN:  Stable on  blocker and ARB.  4.  HL:  LDL 61 last year.  Cont high potency statin rx.  5.  OSA: uses CPAP @ [facility].  6.  Small AAA: noted on ultrasound this  admission - 3cm.  F/u u/s rec in 2 yrs.  Signed, Murray Hodgkins, NP  06/22/2020, 8:02 AM    For questions or updates, please contact   Please consult [email or web address] for contact info under Cardiology/STEMI.

## 2020-06-22 NOTE — Discharge Instructions (Signed)
Follow up with Dr. Fletcher Anon for further med management , refills, and medical management in 1 week. Increase activity slowly.  Heart health low sodium diet.

## 2020-06-28 NOTE — Progress Notes (Signed)
Cardiology Office Note:    Date:  06/29/2020   ID:  Willie Clark, DOB [birth year], MRN [id number]  PCP:  [name], MD  [facility] Cardiologist:  [name], MD  [place] Electrophysiologist:  None   Referring MD: [name], MD   Chief Complaint: TOC/Hospital follow-up  History of Present Illness:    Willie Clark is a 70 y.o. male with a hx of CAD status post CABG in 1993 with redo in 2007, left heart cath 12/13/2019 with patent grafts and patent stent with moderate ISR and diffuse small vessel disease stable compared to 2017, HFrEF, hyperlipidemia, hypertension, GERD, nephrolithiasis, OSA, stroke, prior postop PE, PFO closure 2007 who presents for hospital follow-up.  Patient was hospitalized in August 2021 with chest pain.  Echo showed EF 40 to 45%, cath showed severe underlying three-vessel coronary arteries with patent grafts and no significant change since 2017.  The patient's meds were switched to losartan and carvedilol.  Rosuvastatin was increased to 40 mg daily.  Losartan was switched to [facility] however patient had allergic reaction.  Spironolactone was added.    Patient was seen by Dr. Fletcher Anon 06/20/2020 in the office and reported chest pain. Pain got up to a 10 out of 10 and he was given 1 sublingual nitro with improvement. EKG showed minor inferior ST elevation with reciprocal changes and he was brought up to the Cath Lab for emergent cardiac catheterization.  Catheterization showed CTO ostial LAD and RCA, mild to mo ISR LCx stent, widely patent LIMA-LAD, patent SVG-OM2 with 60-70% ostial/proxmoal graft stenosis, subtotally occluded SVG-RCA with long segment proximal graft disease 80-99% with TIMI flow 1, mod reduced LVEF with inf akinesis. He was treated with successful PCI to SVG-RCA with overlapping stents x3.  For recurrent chest pain can consider PCI to vein graft to OM 2.  Started on DAPT with aspirin and Brilinta, plan to continue for 12 months.  Echo  showed EF 35 to 40%.  He was continued on Coreg and losartan.  Today, the patient reports overall he has low energy. He also report sob and has chest tightness. Breathing is worse with exertion and on inspiration. This seems to be similar to discharge, possibly a little worse. He uses his CPAP at night. Chest tightness feels about the same as chronic angina, some days it's worse. He has been walking about 10-15 minutes. Exertion can bring about the chest tightness and sob, but it's not every time. Has not needed SL NTG. He reports compliance with Aspirin and Brilinta, denies bleeding issues. Cath site healed well, left radial. He has not been taking lasix. Before he was taking it 2-3 times a week. Also was on potassium. Denies lower leg edema. He wears compression stockings and elevated his feet. Eating low salt diet. Weights have been stable, around 187lbs. He had nasuea before hospitalization, but this to seems to have improved. BP elevated today. This morning 135/75, it;s been up at home as well. Labs were drawn by his PCP Dr. Quay Burow, at [facility] yesterday. We will request these.   Past Medical History:  Diagnosis Date   Anxiety    Arthritis    Barrett's esophagus    Bowel obstruction ([place]) 02/2009   small bowel   Chronic chest pain    Coronary artery disease    a. 1993 CABG; b. 2007 Redo CABG; c. 09/2015 Cath: LM mild dzs, LAD 100ost, RI mild dzs, LCX 60p, OM1 100, RCA 100ost, VG->OM2 min irregs,  LIMA->LAD nl, VG->RPDA 30ost, 20d, nl EF->Med rx.   Depression    Diverticulitis    Diverticulosis    Erectile dysfunction    Fibromyalgia    Gastric ulcer    Gastritis    GERD (gastroesophageal reflux disease)    Headache    Hemorrhoids    HFrEF (heart failure with reduced ejection fraction) ([place])    a. 09/2016 Echo: EF 55-60%, no rwma, triv AI, mild MR, mildly dil LA. -> as of April 2020: EF 40 to 45%.  Septal dyssynergy/hypokinesis due to postop state but otherwise unable  to assess wall motion [name] due to poor study.   History of myocardial infarct at age less than 79 years    Hyperlipidemia    Hypertension    Kidney stones    going to [facility]  June 26 to see kidney specialist   Lightheadedness    Mass on back    Nephrolithiasis    Orthostatic hypotension    a. Improved after discontinuation of metoprolol.   OSA (obstructive sleep apnea)    a. On CPAP.   Pulmonary embolism ([place]) 01/2006   Post-op/treated   Skin lesions, generalized    Stroke [facility]) 1993   right side weakness no blood thinners   on Aspirin   Syncope and collapse    Urticaria     Past Surgical History:  Procedure Laterality Date   BACK SURGERY     bowel obstruction  01/2009   CARDIAC CATHETERIZATION  11/2006   patent grafts. Significant OM3 disease and occluded diagonals.   CARDIAC CATHETERIZATION  06/2010   patent grafts. significant ISR in proximal LCX but OM2 is bypassed and gives retrograde flow to OM3.    CARDIAC CATHETERIZATION  06/2013   [facility]: Patent grafts with 60% proximal in-stent restenosis in the left circumflex with FFR of 0.85   CARDIAC CATHETERIZATION Left 09/25/2015   Procedure: Left Heart Cath and Cors/Grafts Angiography;  Surgeon: Wellington Hampshire, MD;  Location: [place] CV LAB;  Service: Cardiovascular;  Laterality: Left;   CERVICAL FUSION     CHOLECYSTECTOMY     COLON SURGERY  01/2009   BOWEL RESECTION DUE TO SMALL BOWEL OBSTRUCTION   COLONOSCOPY     COLONOSCOPY WITH PROPOFOL N/A 10/28/2017   Procedure: COLONOSCOPY WITH PROPOFOL;  Surgeon: Lollie Sails, MD;  Location: [facility] ENDOSCOPY;  Service: Endoscopy;  Laterality: N/A;   CORONARY ANGIOPLASTY  2006   CORONARY ARTERY BYPASS GRAFT  1993/01/2006   redo at [facility]. LIMA to LAD, SVG to OM2 and SVG to RPDA   CORONARY STENT INTERVENTION N/A 06/20/2020   Procedure: CORONARY STENT INTERVENTION;  Surgeon: Nelva Bush, MD;  Location: [place] CV LAB;  Service:  Cardiovascular;  Laterality: N/A;   CYSTOSCOPY WITH STENT PLACEMENT Bilateral 09/24/2015   Procedure: CYSTOSCOPY WITH BILATERAL RETROGRADES, BILATERAL STENT PLACEMENT;  Surgeon: Festus Aloe, MD;  Location: [facility] ORS;  Service: Urology;  Laterality: Bilateral;   ESOPHAGOGASTRODUODENOSCOPY N/A 10/25/2014   Procedure: ESOPHAGOGASTRODUODENOSCOPY (EGD);  Surgeon: Lollie Sails, MD;  Location: [facility] ENDOSCOPY;  Service: Endoscopy;  Laterality: N/A;   ESOPHAGOGASTRODUODENOSCOPY (EGD) WITH PROPOFOL N/A 02/03/2017   Procedure: ESOPHAGOGASTRODUODENOSCOPY (EGD) WITH PROPOFOL;  Surgeon: Lollie Sails, MD;  Location: [facility] ENDOSCOPY;  Service: Endoscopy;  Laterality: N/A;   ESOPHAGOGASTRODUODENOSCOPY (EGD) WITH PROPOFOL N/A 04/18/2017   Procedure: ESOPHAGOGASTRODUODENOSCOPY (EGD) WITH PROPOFOL;  Surgeon: Lollie Sails, MD;  Location: [facility] ENDOSCOPY;  Service: Endoscopy;  Laterality: N/A;   EYE SURGERY Bilateral    Cataract  Extraction with IOL   HERNIA REPAIR     INGUINAL HERNIA REPAIR Bilateral 01/23/2016   Procedure: HERNIA REPAIR INGUINAL ADULT BILATERAL;  Surgeon: Leonie Green, MD;  Location: [facility] ORS;  Service: General;  Laterality: Bilateral;   LEFT HEART CATH AND CORS/GRAFTS ANGIOGRAPHY N/A 12/13/2019   Procedure: LEFT HEART CATH AND CORS/GRAFTS ANGIOGRAPHY;  Surgeon: Wellington Hampshire, MD;  Location: [place] CV LAB;  Service: Cardiovascular;  Laterality: N/A;   LEFT HEART CATH AND CORS/GRAFTS ANGIOGRAPHY N/A 06/20/2020   Procedure: LEFT HEART CATH AND CORS/GRAFTS ANGIOGRAPHY;  Surgeon: Nelva Bush, MD;  Location: [place] CV LAB;  Service: Cardiovascular;  Laterality: N/A;   LUMBAR LAMINECTOMY/DECOMPRESSION MICRODISCECTOMY Left 10/01/2016   Procedure: Left Lumbar four-five Laminotomy for resection of synovial cyst;  Surgeon: Erline Levine, MD;  Location: [place];  Service: Neurosurgery;  Laterality: Left;  left   NECK SURGERY  06/2009   SHOULDER SURGERY  2010     Current Medications: Current Meds  Medication Sig   albuterol (PROVENTIL HFA;VENTOLIN HFA) 108 (90 Base) MCG/ACT inhaler Inhale into the lungs every 4 (four) hours as needed for wheezing or shortness of breath.   aspirin EC 81 MG EC tablet Take 1 tablet (81 mg total) by mouth daily. Swallow whole.   citalopram (CELEXA) 20 MG tablet Take 20 mg by mouth daily.    dicyclomine (BENTYL) 10 MG capsule Take 10 mg by mouth 3 (three) times daily.   EPINEPHrine 0.3 mg/0.3 mL IJ SOAJ injection Inject 0.3 mg into the muscle as needed.    Erenumab-aooe 140 MG/ML SOAJ Inject 140 mg into the skin every 28 (twenty-eight) days.   finasteride (PROSCAR) 5 MG tablet Take 5 mg by mouth daily.   gabapentin (NEURONTIN) 800 MG tablet Take 1 tablet by mouth 3 (three) times daily.   icosapent Ethyl (VASCEPA) 1 g capsule Take 2 capsules (2 g total) by mouth 2 (two) times daily.   isosorbide mononitrate (IMDUR) 30 MG 24 hr tablet Take 0.5 tablets (15 mg total) by mouth daily.   losartan (COZAAR) 25 MG tablet Take 1 tablet (25 mg total) by mouth daily.   nitroGLYCERIN (NITROSTAT) 0.4 MG SL tablet Place 1 tablet (0.4 mg total) under the tongue every 5 (five) minutes as needed for chest pain.   pantoprazole (PROTONIX) 40 MG tablet Take 40 mg by mouth 2 (two) times daily.    rOPINIRole (REQUIP) 1 MG tablet Take 1 mg by mouth 2 (two) times daily.   rosuvastatin (CRESTOR) 40 MG tablet Take 1 tablet (40 mg total) by mouth daily.   spironolactone (ALDACTONE) 25 MG tablet Take 0.5 tablets (12.5 mg total) by mouth daily.   vitamin B-12 (CYANOCOBALAMIN) 1000 MCG tablet Take 1,000 mcg by mouth daily.   [DISCONTINUED] carvedilol (COREG) 3.125 MG tablet Take 1 tablet (3.125 mg total) by mouth 2 (two) times daily with a meal.   [DISCONTINUED] ticagrelor (BRILINTA) 90 MG TABS tablet Take 1 tablet (90 mg total) by mouth 2 (two) times daily.     Allergies:   Amitriptyline, Cyclobenzaprine, Dicyclomine,  Sacubitril-valsartan, Acetaminophen, Amitriptyline hcl, Bacon flavor, Hydrocodone, Fish oil, Lidocaine, Omega-3 fatty acids-vitamin e, Other, Oxycodone-acetaminophen, and Sulfa antibiotics   Social History   Socioeconomic History   Marital status: Married    Spouse name: Not on file   Number of children: Not on file   Years of education: Not on file   Highest education level: Not on file  Occupational History   Not on file  Tobacco  Use   Smoking status: Former Smoker    Packs/day: 2.50    Types: Cigarettes    Quit date: 08/21/1991    Years since quitting: 28.8   Smokeless tobacco: Never Used  Vaping Use   Vaping Use: Never used  Substance and Sexual Activity   Alcohol use: Not Currently   Drug use: No   Sexual activity: Not on file  Other Topics Concern   Not on file  Social History Narrative   Not on file   Social Determinants of Health   Financial Resource Strain: Not on file  Food Insecurity: Not on file  Transportation Needs: Not on file  Physical Activity: Not on file  Stress: Not on file  Social Connections: Not on file     Family History: The patient's family history includes Heart attack in his father; Heart disease (age of onset: 51) in his father.  ROS:   Please see the history of present illness.     All other systems reviewed and are negative.  EKGs/Labs/Other Studies Reviewed:    The following studies were reviewed today:  Echo 06/20/20  1. Left ventricular ejection fraction, by estimation, is 35 to 40%. The  left ventricle has moderately decreased function. The left ventricle  demonstrates regional wall motion abnormalities (see scoring  diagram/findings for description). Left ventricular  diastolic parameters are consistent with Grade I diastolic dysfunction  (impaired relaxation). There is severe hypokinesis of the left  ventricular, entire inferolateral wall.  2. Right ventricular systolic function is low normal. The right   ventricular size is normal.  3. The mitral valve is normal in structure. No evidence of mitral valve  regurgitation.  4. The aortic valve is tricuspid. Aortic valve regurgitation is mild.  5. The inferior vena cava is normal in size with greater than 50%  respiratory variability, suggesting right atrial pressure of 3 mmHg.   Cardiac cath 06/20/20 Procedures  CORONARY STENT INTERVENTION  LEFT HEART CATH AND CORS/GRAFTS ANGIOGRAPHY    Conclusion  Conclusions: 1. Severe native CAD, including chronic total occlusions of the ostial LAD and RCA. 2. Patent LCx stent with mild to moderate in-stent restenosis. 3. Widely patent LIMA-LAD. 4. Patent SVG-OM2 with 60-70% ostial/proximal graft stenosis. 5. Subtotally occluded SVG-RCA with long segment of proximal graft disease of up to 80% and 99% stenosis at the distal anastomosis with TIMI-1 flow. 6. Moderately reduced left ventricular contraction with inferior akinesis. Normal left ventricular filling pressure. 7. Successful PCI to SVG-RCA with overlapping Resolute Onyx 4.0 x 26 mm and 4.0 x 38 mm proximal and 2.5 x 18 mm distal (extending into rPDA) drug-eluting stents.  Recommendations: 1. Continue tirofiban infusion x 6 hours. 2. Dual antiplatelet therapy with aspirin and tigagrelor for at least 12 months. 3. Aggressive secondary prevention. 4. If patient has recurrent chest pain, consider PCI to SVG-OM2. 5. Follow-up echocardiogram.  Nelva Bush, MD [facility]    Intervention       EKG:  EKG is  ordered today.  The ekg ordered today demonstrates SR, 63 bpm, PVC, inferior/lateral T wave changes ( not new)  Recent Labs: 12/12/2019: Magnesium 2.7 06/20/2020: ALT 33 06/21/2020: BUN 16; Creatinine, Ser 1.00; Hemoglobin 13.7; Platelets 153; Potassium 4.1; Sodium 139  Recent Lipid Panel    Component Value Date/Time   CHOL 165 02/07/2020 0658   TRIG 317 (H) 02/07/2020 0658   HDL 41 02/07/2020 0658   CHOLHDL 4.0  02/07/2020 0658   VLDL 63 (H) 02/07/2020 0658   LDLCALC  61 02/07/2020 0658    Physical Exam:    VS:  BP (!) 150/90 (BP Location: Left Arm, Patient Position: Sitting, Cuff Size: Large)    Ht 5\' 9"  (1.753 m)    Wt 194 lb (88 kg)    BMI 28.65 kg/m     Wt Readings from Last 3 Encounters:  06/29/20 194 lb (88 kg)  06/20/20 175 lb 0.7 oz (79.4 kg)  06/20/20 197 lb 6 oz (89.5 kg)     GEN: Well nourished, well developed in no acute distress HEENT: Normal NECK: No JVD; No carotid bruits LYMPHATICS: No lymphadenopathy CARDIAC: RRR, no murmurs, rubs, gallops RESPIRATORY:  Clear to auscultation without rales, wheezing or rhonchi  ABDOMEN: Soft, non-tender, non-distended MUSCULOSKELETAL:  No edema; No deformity  SKIN: Warm and dry NEUROLOGIC:  Alert and oriented x 3 PSYCHIATRIC:  Normal affect   ASSESSMENT:    1. Coronary artery disease involving native coronary artery of native heart with unstable angina pectoris ([place])   2. Chronic systolic heart failure ([place])   3. Essential hypertension   4. Hyperlipidemia, unspecified hyperlipidemia type   5. AAA (abdominal aortic aneurysm) without rupture ([facility])    PLAN:    In order of problems listed above:  Recent STEMI CAD s/p prior CABG with recent stenting Patient has been overall doing well. He has decreased energy along with some dyspnea on exertion and chest tightness. Sometimes this is brought on with exertion but not always. Chest tightness is similar to chronic angina he has had for years. He has been walking 10-15 minutes on a treadmill at a time and generally feels ok, just tired. He has not needed SL NTG. He reports compliance with DAPT, denies bleeding issues. Cath site, left radial, without complications. Ok for cardiac rehab. BP up today. Continue Aspirin, Brilinta, statin, BB, ARB. Possible that symptoms are from recent STEMI with PCI, however cath did show residual disease 60-70% SVG-OM2 and can consider PCI for recurrent chest  pain. With elevated pressure and chest tightness I will add Imdur 15 mg daily. If symptoms do not improve can consider PCI. Considered increasing BB but heart rates in the 60s. Labs were drawn yesterday at PCP office at [facility], we will request them.     HFrEF/ICM Echo showed EF 35-40% on recent echo. Prior echo in August 2021 showed LVEF 40-45%. Patient reports stable weights since discharge. Euvolemic on exam. Continue BB, ARB, spiro. He has prior allergic reaction to [facility]. Can consider Farxiga at follow-up. Will request labs as above. BMET in 2 weeks with initiation of spironolactone.  HTN BP elevated today. It has been elevated at home as well. Continue Coreg, Losartan, and spiro. Imdur as above. Continue with daily Bps.   HLD LDL 61. Continue statin  OSA Compliance with CPAP  Small AAA On [place] during recent admission. F/u US in 2 years   Disposition: Follow up in 1 month(s) with APP/MD   Signed, Olevia Westervelt Ninfa Meeker, PA-C  06/29/2020 2:34 PM    [place]

## 2020-06-29 ENCOUNTER — Encounter: Payer: Self-pay | Admitting: Medical

## 2020-06-29 ENCOUNTER — Other Ambulatory Visit: Payer: Self-pay

## 2020-06-29 ENCOUNTER — Ambulatory Visit: Payer: Medicare HMO | Admitting: Medical

## 2020-06-29 VITALS — BP 150/90 | Ht 69.0 in | Wt 194.0 lb

## 2020-06-29 DIAGNOSIS — I10 Essential (primary) hypertension: Secondary | ICD-10-CM

## 2020-06-29 DIAGNOSIS — I5022 Chronic systolic (congestive) heart failure: Secondary | ICD-10-CM

## 2020-06-29 DIAGNOSIS — I714 Abdominal aortic aneurysm, without rupture, unspecified: Secondary | ICD-10-CM

## 2020-06-29 DIAGNOSIS — I25110 Atherosclerotic heart disease of native coronary artery with unstable angina pectoris: Secondary | ICD-10-CM | POA: Diagnosis not present

## 2020-06-29 DIAGNOSIS — E785 Hyperlipidemia, unspecified: Secondary | ICD-10-CM

## 2020-06-29 MED ORDER — TICAGRELOR 90 MG PO TABS: 90.0000 mg | ORAL_TABLET | Freq: Two times a day (BID) | ORAL | 0 refills | Status: DC

## 2020-06-29 MED ORDER — CARVEDILOL 3.125 MG PO TABS
3.1250 mg | ORAL_TABLET | Freq: Two times a day (BID) | ORAL | 0 refills | Status: DC
Start: 2020-06-29 — End: 2020-07-03

## 2020-06-29 MED ORDER — ISOSORBIDE MONONITRATE ER 30 MG PO TB24: 15.0000 mg | ORAL_TABLET | Freq: Every day | ORAL | 3 refills | Status: DC

## 2020-06-29 NOTE — Patient Instructions (Signed)
Medication Instructions:  Your physician has recommended you make the following change in your medication:   1. START Isosorbide mononitrate 30 mg and take half a tablet (15 mg) once daily   *If you need a refill on your cardiac medications before your next appointment, please call your pharmacy*   Lab Work: BMET in 2 weeks  Around 07/13/20 Go to [facility] Entrance to have those labs done. Check in at registration and they will direct where to go. No appointment is needed.   If you have labs (blood work) drawn today and your tests are completely normal, you will receive your results only by: [name] MyChart Message (if you have MyChart) OR . A paper copy in the mail If you have any lab test that is abnormal or we need to change your treatment, we will call you to review the results.   Testing/Procedures: None   Follow-Up: At [facility], you and your health needs are our priority.  As part of our continuing mission to provide you with exceptional heart care, we have created designated Provider Care Teams.  These Care Teams include your primary Cardiologist (physician) and Advanced Practice Providers (APPs -  Physician Assistants and Nurse Practitioners) who all work together to provide you with the care you need, when you need it.   Your next appointment:   1 month(s)  The format for your next appointment:   In Person  Provider:   [name], MD, Murray Hodgkins, NP or Cadence Kathlen Mody, PA-C   Other Instructions Make sure to weigh daily and keep a log.  Please monitor blood pressures and keep a log of your readings. Make sure to check 2 hours after your medications.   AVOID these things for 30 minutes before checking your blood pressure:  No Drinking caffeine.  No Drinking alcohol.  No Eating.  No Smoking.  No Exercising.   Five minutes before checking your blood pressure:  Pee.  Sit in a dining chair. Avoid sitting in a soft couch or armchair.  Be  quiet. Do not talk.

## 2020-07-03 ENCOUNTER — Other Ambulatory Visit: Payer: Self-pay | Admitting: *Deleted

## 2020-07-03 MED ORDER — CARVEDILOL 3.125 MG PO TABS: 3.1250 mg | ORAL_TABLET | Freq: Two times a day (BID) | ORAL | 0 refills | Status: DC

## 2020-07-03 MED ORDER — TICAGRELOR 90 MG PO TABS: 90.0000 mg | ORAL_TABLET | Freq: Two times a day (BID) | ORAL | 0 refills | Status: AC

## 2020-07-10 ENCOUNTER — Other Ambulatory Visit: Payer: Self-pay

## 2020-07-10 ENCOUNTER — Encounter: Payer: Medicare HMO | Attending: Internal Medicine | Admitting: *Deleted

## 2020-07-10 ENCOUNTER — Telehealth: Payer: Self-pay | Admitting: Cardiovascular Disease

## 2020-07-10 DIAGNOSIS — I214 Non-ST elevation (NSTEMI) myocardial infarction: Secondary | ICD-10-CM | POA: Insufficient documentation

## 2020-07-10 DIAGNOSIS — Z955 Presence of coronary angioplasty implant and graft: Secondary | ICD-10-CM | POA: Insufficient documentation

## 2020-07-10 NOTE — Telephone Encounter (Addendum)
   Primary Cardiologist: [name], MD  Chart reviewed as part of pre-operative protocol coverage. Very complex cardiac history outlined recently. Had PCI earlier this month and still with some symptoms at last OV, prompting medication titration and outpatient follow-up. I reached out to Dr. Fletcher Anon who suggests that he hold off on any elective procedures at present time including dental cleaning. This can be readdressed at his next follow-up. Will route to callback team to call patient's dentist/office.  I tried to call office myself but got message that they are either busy/away or with another patient. LMTCB.  [name], PA-C 07/10/2020, 12:03 PM

## 2020-07-10 NOTE — Telephone Encounter (Signed)
   [facility] Pre-operative Risk Assessment    HEARTCARE STAFF: - Please ensure there is not already an duplicate clearance open for this procedure. - Under Visit Info/Reason for Call, type in Other and utilize the format Clearance MM/DD/YY or Clearance TBD. Do not use dashes or single digits. - If request is for dental extraction, please clarify the # of teeth to be extracted.  Request for surgical clearance:  1. What type of surgery is being performed? Dental cleaning  2. When is this surgery scheduled? 07/10/20  3. What type of clearance is required (medical clearance vs. Pharmacy clearance to hold med vs. Both)? both  4. Are there any medications that need to be held prior to surgery and how long? Any anticoags  5. Practice name and name of physician performing surgery? Jerome Cralyl DDS  6. What is the office phone number? [phone].  7.   What is the office fax number? [phone]  8.   Anesthesia type (None, local, MAC, general) ? none   Marykay Lex 07/10/2020, 11:33 AM  _________________________________________________________________   (provider comments below)

## 2020-07-10 NOTE — Telephone Encounter (Signed)
S/w Misty with dental office and informed of recommendations per Dr. Fletcher Anon. Pt has appt 08/07/20 which at that time pt can be assessed for pre op clearance. Assured Misty we will send clearance once pt has been cleared. Misty thanked me for the update.

## 2020-07-10 NOTE — Telephone Encounter (Signed)
Patient is in office now. Office is willing to take a verbal clearance at [facility]

## 2020-07-10 NOTE — Progress Notes (Signed)
Initial telephone orientation completed. Diagnosis can be found in [facility] 3/1. EP orientation scheduled for Wed 3/23 at 9:30

## 2020-07-12 ENCOUNTER — Other Ambulatory Visit: Payer: Self-pay | Admitting: Family

## 2020-07-12 ENCOUNTER — Other Ambulatory Visit: Payer: Self-pay

## 2020-07-12 ENCOUNTER — Other Ambulatory Visit
Admission: RE | Admit: 2020-07-12 | Discharge: 2020-07-12 | Disposition: A | Discharge status: Home / Self Care | Payer: Medicare HMO | Source: Ambulatory Visit | Attending: Medical | Admitting: Medical

## 2020-07-12 ENCOUNTER — Encounter: Payer: Medicare HMO | Admitting: *Deleted

## 2020-07-12 VITALS — Ht 69.8 in | Wt 197.2 lb

## 2020-07-12 DIAGNOSIS — Z955 Presence of coronary angioplasty implant and graft: Secondary | ICD-10-CM | POA: Diagnosis not present

## 2020-07-12 DIAGNOSIS — I214 Non-ST elevation (NSTEMI) myocardial infarction: Secondary | ICD-10-CM

## 2020-07-12 DIAGNOSIS — I5022 Chronic systolic (congestive) heart failure: Secondary | ICD-10-CM | POA: Diagnosis present

## 2020-07-12 DIAGNOSIS — I25110 Atherosclerotic heart disease of native coronary artery with unstable angina pectoris: Secondary | ICD-10-CM | POA: Diagnosis present

## 2020-07-12 LAB — BASIC METABOLIC PANEL
Anion gap: 8 (ref 5–15)
BUN: 11 mg/dL (ref 8–23)
CO2: 28 mmol/L (ref 22–32)
Calcium: 9.3 mg/dL (ref 8.9–10.3)
Chloride: 102 mmol/L (ref 98–111)
Creatinine, Ser: 0.92 mg/dL (ref 0.61–1.24)
GFR, Estimated: >60 mL/min (ref >60)
Glucose, Bld: 114 mg/dL — ABNORMAL HIGH (ref 70–99)
Potassium: 3.9 mmol/L (ref 3.5–5.1)
Sodium: 138 mmol/L (ref 135–145)

## 2020-07-12 NOTE — Progress Notes (Signed)
Cardiac Individual Treatment Plan  Patient Details  Name: Willie Clark MRN: [id number] Date of Birth: 1950-06-03 Referring Provider:   Flowsheet Row Cardiac Rehab from 07/12/2020 in [facility] Cardiac and Pulmonary Rehab  Referring Provider End, Harrell Gave MD  [facility] Cardiologist: Dr. Rogue Jury Arida]      Initial Encounter Date:  Flowsheet Row Cardiac Rehab from 07/12/2020 in [facility] Cardiac and Pulmonary Rehab  Date 07/12/20      Visit Diagnosis: NSTEMI (non-ST elevated myocardial infarction) [facility])  Status post coronary artery stent placement  Patient's Home Medications on Admission:  Current Outpatient Medications:  .  albuterol (PROVENTIL HFA;VENTOLIN HFA) 108 (90 Base) MCG/ACT inhaler, Inhale into the lungs every 4 (four) hours as needed for wheezing or shortness of breath., Disp: , Rfl:  .  aspirin EC 81 MG EC tablet, Take 1 tablet (81 mg total) by mouth daily. Swallow whole., Disp: 30 tablet, Rfl: 11 .  carvedilol (COREG) 3.125 MG tablet, Take 1 tablet (3.125 mg total) by mouth 2 (two) times daily with a meal., Disp: 180 tablet, Rfl: 0 .  citalopram (CELEXA) 20 MG tablet, Take 20 mg by mouth daily. , Disp: , Rfl:  .  dicyclomine (BENTYL) 10 MG capsule, Take 10 mg by mouth 3 (three) times daily., Disp: , Rfl:  .  EPINEPHrine 0.3 mg/0.3 mL IJ SOAJ injection, Inject 0.3 mg into the muscle as needed. , Disp: , Rfl:  .  Erenumab-aooe 140 MG/ML SOAJ, Inject 140 mg into the skin every 28 (twenty-eight) days., Disp: , Rfl:  .  finasteride (PROSCAR) 5 MG tablet, Take 5 mg by mouth daily., Disp: , Rfl:  .  gabapentin (NEURONTIN) 800 MG tablet, Take 1 tablet by mouth 3 (three) times daily., Disp: , Rfl:  .  icosapent Ethyl (VASCEPA) 1 g capsule, Take 2 capsules (2 g total) by mouth 2 (two) times daily., Disp: 360 capsule, Rfl: 1 .  isosorbide mononitrate (IMDUR) 30 MG 24 hr tablet, Take 0.5 tablets (15 mg total) by mouth daily., Disp: 15 tablet, Rfl: 3 .  losartan (COZAAR) 25 MG tablet, Take 1  tablet (25 mg total) by mouth daily., Disp: 30 tablet, Rfl: 0 .  nitroGLYCERIN (NITROSTAT) 0.4 MG SL tablet, Place 1 tablet (0.4 mg total) under the tongue every 5 (five) minutes as needed for chest pain., Disp: 90 tablet, Rfl: 1 .  pantoprazole (PROTONIX) 40 MG tablet, Take 40 mg by mouth 2 (two) times daily. , Disp: , Rfl:  .  rOPINIRole (REQUIP) 1 MG tablet, Take 1 mg by mouth 2 (two) times daily., Disp: , Rfl:  .  rosuvastatin (CRESTOR) 40 MG tablet, Take 1 tablet (40 mg total) by mouth daily., Disp: 90 tablet, Rfl: 1 .  spironolactone (ALDACTONE) 25 MG tablet, Take 0.5 tablets (12.5 mg total) by mouth daily., Disp: 45 tablet, Rfl: 1 .  ticagrelor (BRILINTA) 90 MG TABS tablet, Take 1 tablet (90 mg total) by mouth 2 (two) times daily., Disp: 180 tablet, Rfl: 0 .  vitamin B-12 (CYANOCOBALAMIN) 1000 MCG tablet, Take 1,000 mcg by mouth daily., Disp: , Rfl:   Past Medical History: Past Medical History:  Diagnosis Date  . Anxiety   . Arthritis   . Barrett's esophagus   . Bowel obstruction ([place]) 02/2009   small bowel  . Chronic chest pain   . Coronary artery disease    a. 1993 CABG; b. 2007 Redo CABG; c. 09/2015 Cath: LM mild dzs, LAD 100ost, RI mild dzs, LCX 60p, OM1 100, RCA 100ost, VG->OM2  min irregs, LIMA->LAD nl, VG->RPDA 30ost, 20d, nl EF->Med rx.  . Depression   . Diverticulitis   . Diverticulosis   . Erectile dysfunction   . Fibromyalgia   . Gastric ulcer   . Gastritis   . GERD (gastroesophageal reflux disease)   . Headache   . Hemorrhoids   . HFrEF (heart failure with reduced ejection fraction) ([place])    a. 09/2016 Echo: EF 55-60%, no rwma, triv AI, mild MR, mildly dil LA. -> as of April 2020: EF 40 to 45%.  Septal dyssynergy/hypokinesis due to postop state but otherwise unable to assess wall motion [name] due to poor study.  . History of myocardial infarct at age less than 90 years   . Hyperlipidemia   . Hypertension   . Kidney stones    going to [facility]  June 26 to see  kidney specialist  . Lightheadedness   . Mass on back   . Nephrolithiasis   . Orthostatic hypotension    a. Improved after discontinuation of metoprolol.  . OSA (obstructive sleep apnea)    a. On CPAP.  [name] Pulmonary embolism ([place]) 01/2006   Post-op/treated  . Skin lesions, generalized   . Stroke [facility]) 1993   right side weakness no blood thinners   on Aspirin  . Syncope and collapse   . Urticaria     Tobacco Use: Social History   Tobacco Use  Smoking Status Former Smoker  . Packs/day: 2.50  . Types: Cigarettes  . Quit date: 08/21/1991  . Years since quitting: 28.9  Smokeless Tobacco Never Used    Labs: Recent Review Flowsheet Data    Labs for ITP Cardiac and Pulmonary Rehab Latest Ref Rng & Units 09/24/2015 08/01/2018 12/11/2019 12/13/2019 02/07/2020   Cholestrol 0 - 200 mg/dL - 154 - 181 165   LDLCALC 0 - 99 mg/dL - 56 - 84 61   HDL >40 mg/dL - 41 - 35(L) 41   Trlycerides <150 mg/dL - 285(H) - 312(H) 317(H)   Hemoglobin A1c 4.8 - 5.6 % 5.6 - 6.0(H) - -       Exercise Target Goals: Exercise Program Goal: Individual exercise prescription set using results from initial 6 min walk test and THRR while considering  patient's activity barriers and safety.   Exercise Prescription Goal: Initial exercise prescription builds to 30-45 minutes a day of aerobic activity, 2-3 days per week.  Home exercise guidelines will be given to patient during program as part of exercise prescription that the participant will acknowledge.   Education: Aerobic Exercise: - Group verbal and visual presentation on the components of exercise prescription. Introduces F.I.T.T principle from ACSM for exercise prescriptions.  Reviews F.I.T.T. principles of aerobic exercise including progression. Written material given at graduation. Flowsheet Row Cardiac Rehab from 07/12/2020 in [facility] Cardiac and Pulmonary Rehab  Education need identified 07/12/20      Education: Resistance Exercise: - Group verbal and  visual presentation on the components of exercise prescription. Introduces F.I.T.T principle from ACSM for exercise prescriptions  Reviews F.I.T.T. principles of resistance exercise including progression. Written material given at graduation.    Education: Exercise & Equipment Safety: - Individual verbal instruction and demonstration of equipment use and safety with use of the equipment. Flowsheet Row Cardiac Rehab from 07/12/2020 in [facility] Cardiac and Pulmonary Rehab  Date 07/12/20  Educator [facility]  Instruction Review Code 1- Verbalizes Understanding      Education: Exercise Physiology & General Exercise Guidelines: - Group verbal and written instruction with models  to review the exercise physiology of the cardiovascular system and associated critical values. Provides general exercise guidelines with specific guidelines to those with heart or lung disease.    Education: Flexibility, Balance, Mind/Body Relaxation: - Group verbal and visual presentation with interactive activity on the components of exercise prescription. Introduces F.I.T.T principle from ACSM for exercise prescriptions. Reviews F.I.T.T. principles of flexibility and balance exercise training including progression. Also discusses the mind body connection.  Reviews various relaxation techniques to help reduce and manage stress (i.e. Deep breathing, progressive muscle relaxation, and visualization). Balance handout provided to take home. Written material given at graduation.   Activity Barriers & Risk Stratification:  Activity Barriers & Cardiac Risk Stratification - 07/12/20 1027      Activity Barriers & Cardiac Risk Stratification   Activity Barriers Arthritis;Back Problems;Neck/Spine Problems;Assistive Device;Deconditioning;Muscular Weakness;Shortness of Breath;Balance Concerns    Cardiac Risk Stratification High           6 Minute Walk:  6 Minute Walk    Row Name 07/12/20 1026         6 Minute Walk   Phase Initial      Distance 1080 feet     Walk Time 6 minutes     # of Rest Breaks 0     MPH 2.04     METS 2.5     RPE 13     Perceived Dyspnea  2     VO2 Peak 8.75     Symptoms Yes (comment)     Comments R leg weakness from CVA, chronic pain 6/10     Resting HR 62 bpm     Resting BP 150/72     Resting Oxygen Saturation  95 %     Exercise Oxygen Saturation  during 6 min walk 98 %     Max Ex. HR 81 bpm     Max Ex. BP 174/72     2 Minute Post BP 134/68            Oxygen Initial Assessment:   Oxygen Re-Evaluation:   Oxygen Discharge (Final Oxygen Re-Evaluation):   Initial Exercise Prescription:  Initial Exercise Prescription - 07/12/20 1000      Date of Initial Exercise RX and Referring Provider   Date 07/12/20    Referring Provider End, Harrell Gave MD   Primary Cardiologist: Dr. [name]     Treadmill   MPH 2    Grade 0    Minutes 15    METs 2.53      REL-XR   Level 1    Speed 50    Minutes 15    METs 2      T5 Nustep   Level 1    SPM 80    Minutes 15    METs 2      Prescription Details   Frequency (times per week) 3    Duration Progress to 30 minutes of continuous aerobic without signs/symptoms of physical distress      Intensity   THRR 40-80% of Max Heartrate 98-133    Ratings of Perceived Exertion 11-13    Perceived Dyspnea 0-4      Progression   Progression Continue to progress workloads to maintain intensity without signs/symptoms of physical distress.      Resistance Training   Training Prescription Yes    Weight 4 lb    Reps 10-15           Perform Capillary Blood Glucose checks as needed.  Exercise Prescription  Changes:  Exercise Prescription Changes    Row Name 07/12/20 1000             Response to Exercise   Blood Pressure (Admit) 150/72       Blood Pressure (Exercise) 174/72       Blood Pressure (Exit) 134/68       Heart Rate (Admit) 62 bpm       Heart Rate (Exercise) 81 bpm       Heart Rate (Exit) 56 bpm       Oxygen  Saturation (Admit) 95 %       Oxygen Saturation (Exercise) 98 %       Rating of Perceived Exertion (Exercise) 13       Perceived Dyspnea (Exercise) 2       Symptoms R leg weakness, chronic pain 6/10       Comments walk test results              Exercise Comments:   Exercise Goals and Review:  Exercise Goals    Row Name 07/12/20 1029             Exercise Goals   Increase Physical Activity Yes       Intervention Provide advice, education, support and counseling about physical activity/exercise needs.;Develop an individualized exercise prescription for aerobic and resistive training based on initial evaluation findings, risk stratification, comorbidities and participant's personal goals.       Expected Outcomes Short Term: Attend rehab on a regular basis to increase amount of physical activity.;Long Term: Add in home exercise to make exercise part of routine and to increase amount of physical activity.;Long Term: Exercising regularly at least 3-5 days a week.       Increase Strength and Stamina Yes       Intervention Provide advice, education, support and counseling about physical activity/exercise needs.;Develop an individualized exercise prescription for aerobic and resistive training based on initial evaluation findings, risk stratification, comorbidities and participant's personal goals.       Expected Outcomes Short Term: Increase workloads from initial exercise prescription for resistance, speed, and METs.;Short Term: Perform resistance training exercises routinely during rehab and add in resistance training at home;Long Term: Improve cardiorespiratory fitness, muscular endurance and strength as measured by increased METs and functional capacity (6MWT)       Able to understand and use rate of perceived exertion (RPE) scale Yes       Intervention Provide education and explanation on how to use RPE scale       Expected Outcomes Short Term: Able to use RPE daily in rehab to express  subjective intensity level;Long Term:  Able to use RPE to guide intensity level when exercising independently       Able to understand and use Dyspnea scale Yes       Intervention Provide education and explanation on how to use Dyspnea scale       Expected Outcomes Short Term: Able to use Dyspnea scale daily in rehab to express subjective sense of shortness of breath during exertion;Long Term: Able to use Dyspnea scale to guide intensity level when exercising independently       Knowledge and understanding of Target Heart Rate Range (THRR) Yes       Intervention Provide education and explanation of THRR including how the numbers were predicted and where they are located for reference       Expected Outcomes Short Term: Able to state/look up THRR;Long Term: Able to use THRR to  govern intensity when exercising independently;Short Term: Able to use daily as guideline for intensity in rehab       Able to check pulse independently Yes       Intervention Provide education and demonstration on how to check pulse in carotid and radial arteries.;Review the importance of being able to check your own pulse for safety during independent exercise       Expected Outcomes Short Term: Able to explain why pulse checking is important during independent exercise;Long Term: Able to check pulse independently and accurately       Understanding of Exercise Prescription Yes       Intervention Provide education, explanation, and written materials on patient's individual exercise prescription       Expected Outcomes Short Term: Able to explain program exercise prescription;Long Term: Able to explain home exercise prescription to exercise independently              Exercise Goals Re-Evaluation :   Discharge Exercise Prescription (Final Exercise Prescription Changes):  Exercise Prescription Changes - 07/12/20 1000      Response to Exercise   Blood Pressure (Admit) 150/72    Blood Pressure (Exercise) 174/72    Blood  Pressure (Exit) 134/68    Heart Rate (Admit) 62 bpm    Heart Rate (Exercise) 81 bpm    Heart Rate (Exit) 56 bpm    Oxygen Saturation (Admit) 95 %    Oxygen Saturation (Exercise) 98 %    Rating of Perceived Exertion (Exercise) 13    Perceived Dyspnea (Exercise) 2    Symptoms R leg weakness, chronic pain 6/10    Comments walk test results           Nutrition:  Target Goals: Understanding of nutrition guidelines, daily intake of sodium 1500mg , cholesterol 200mg , calories 30% from fat and 7% or less from saturated fats, daily to have 5 or more servings of fruits and vegetables.  Education: All About Nutrition: -Group instruction provided by verbal, written material, interactive activities, discussions, models, and posters to present general guidelines for heart healthy nutrition including fat, fiber, MyPlate, the role of sodium in heart healthy nutrition, utilization of the nutrition label, and utilization of this knowledge for meal planning. Follow up email sent as well. Written material given at graduation. Flowsheet Row Cardiac Rehab from 07/12/2020 in [facility] Cardiac and Pulmonary Rehab  Education need identified 07/12/20      Biometrics:  Pre Biometrics - 07/12/20 1030      Pre Biometrics   Height 5' 9.8" (1.773 m)    Weight 197 lb 3.2 oz (89.4 kg)    BMI (Calculated) 28.45    Single Leg Stand 4.7 seconds            Nutrition Therapy Plan and Nutrition Goals:   Nutrition Assessments:  MEDIFICTS Score Key:  ?70 Need to make dietary changes   40-70 Heart Healthy Diet  ? 40 Therapeutic Level Cholesterol Diet  Flowsheet Row Cardiac Rehab from 07/12/2020 in [facility] Cardiac and Pulmonary Rehab  Picture Your Plate Total Score on Admission 70     Picture Your Plate Scores:  <06 Unhealthy dietary pattern with much room for improvement.  41-50 Dietary pattern unlikely to meet recommendations for good health and room for improvement.  51-60 More healthful dietary  pattern, with some room for improvement.   >60 Healthy dietary pattern, although there may be some specific behaviors that could be improved.    Nutrition Goals Re-Evaluation:   Nutrition Goals Discharge (Final  Nutrition Goals Re-Evaluation):   Psychosocial: Target Goals: Acknowledge presence or absence of significant depression and/or stress, maximize coping skills, provide positive support system. Participant is able to verbalize types and ability to use techniques and skills needed for reducing stress and depression.   Education: Stress, Anxiety, and Depression - Group verbal and visual presentation to define topics covered.  Reviews how body is impacted by stress, anxiety, and depression.  Also discusses healthy ways to reduce stress and to treat/manage anxiety and depression.  Written material given at graduation.   Education: Sleep Hygiene -Provides group verbal and written instruction about how sleep can affect your health.  Define sleep hygiene, discuss sleep cycles and impact of sleep habits. Review good sleep hygiene tips.    Initial Review & Psychosocial Screening:  Initial Psych Review & Screening - 07/10/20 1537      Initial Review   Current issues with Current Stress Concerns    Source of Stress Concerns Unable to participate in former interests or hobbies;Unable to perform yard/household activities      [place]? Yes   wife     Barriers   Psychosocial barriers to participate in program There are no identifiable barriers or psychosocial needs.      Screening Interventions   Interventions Encouraged to exercise;To provide support and resources with identified psychosocial needs;Provide feedback about the scores to participant    Expected Outcomes Short Term goal: Utilizing psychosocial counselor, staff and physician to assist with identification of specific Stressors or current issues interfering with healing process. Setting desired goal  for each stressor or current issue identified.;Long Term Goal: Stressors or current issues are controlled or eliminated.;Short Term goal: Identification and review with participant of any Quality of Life or Depression concerns found by scoring the questionnaire.;Long Term goal: The participant improves quality of Life and PHQ9 Scores as seen by post scores and/or verbalization of changes           Quality of Life Scores:   Quality of Life - 07/12/20 1030      Quality of Life   Select Quality of Life      Quality of Life Scores   Health/Function Pre 19.47 %    Socioeconomic Pre 20.17 %    Psych/Spiritual Pre 23.93 %    Family Pre 22.33 %    GLOBAL Pre 20.89 %          Scores of 19 and below usually indicate a poorer quality of life in these areas.  A difference of  2-3 points is a clinically meaningful difference.  A difference of 2-3 points in the total score of the Quality of Life Index has been associated with significant improvement in overall quality of life, self-image, physical symptoms, and general health in studies assessing change in quality of life.  PHQ-9: Recent Review Flowsheet Data    Depression screen [facility] 2/9 07/12/2020   Decreased Interest 3   Down, Depressed, Hopeless 1   PHQ - 2 Score 4   Altered sleeping 1   Tired, decreased energy 2   Change in appetite 0   Feeling bad or failure about yourself  0   Trouble concentrating 0   Moving slowly or fidgety/restless 1   Suicidal thoughts 0   PHQ-9 Score 8   Difficult doing work/chores Somewhat difficult     Interpretation of Total Score  Total Score Depression Severity:  1-4 = Minimal depression, 5-9 = Mild depression, 10-14 = Moderate depression,  15-19 = Moderately severe depression, 20-27 = Severe depression   Psychosocial Evaluation and Intervention:  Psychosocial Evaluation - 07/10/20 1544      Psychosocial Evaluation & Interventions   Interventions Encouraged to exercise with the program and follow  exercise prescription;Stress management education;Relaxation education    Comments Mr. Turgeon is currently struggling with shortness of breath and 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 He has done the program before throughout the years so he is familiar with the concept. His breathing makes it hard to do things around the house. He also suffers from arthritis all over his body which he receives injections for, so he is limited on his physical activity. His doctor is aware of his breathing issues and thinks this program will help him. He states besides these current stated issues he isn't experiencing any more stress than usual.    Expected Outcomes Short; attend cardiac rehab for education and exercise. Long: develop positive self care behaviors.    Continue Psychosocial Services  Follow up required by staff           Psychosocial Re-Evaluation:   Psychosocial Discharge (Final Psychosocial Re-Evaluation):   Vocational Rehabilitation: Provide vocational rehab assistance to qualifying candidates.   Vocational Rehab Evaluation & Intervention:  Vocational Rehab - 07/10/20 1537      Initial Vocational Rehab Evaluation & Intervention   Assessment shows need for Vocational Rehabilitation No           Education: Education Goals: Education classes will be provided on a variety of topics geared toward better understanding of heart health and risk factor modification. Participant will state understanding/return demonstration of topics presented as noted by education test scores.  Learning Barriers/Preferences:  Learning Barriers/Preferences - 07/10/20 1536      Learning Barriers/Preferences   Learning Barriers None    Learning Preferences None           General Cardiac Education Topics:  AED/CPR: - Group verbal and written instruction with the use of models to demonstrate the basic use of the AED with the basic ABC's of resuscitation.   Anatomy and Cardiac Procedures: - Group  verbal and visual presentation and models provide information about basic cardiac anatomy and function. Reviews the testing methods done to diagnose heart disease and the outcomes of the test results. Describes the treatment choices: Medical Management, Angioplasty, or Coronary Bypass Surgery for treating various heart conditions including Myocardial Infarction, Angina, Valve Disease, and Cardiac Arrhythmias.  Written material given at graduation. Flowsheet Row Cardiac Rehab from 07/12/2020 in [facility] Cardiac and Pulmonary Rehab  Education need identified 07/12/20      Medication Safety: - Group verbal and visual instruction to review commonly prescribed medications for heart and lung disease. Reviews the medication, class of the drug, and side effects. Includes the steps to properly store meds and maintain the prescription regimen.  Written material given at graduation.   Intimacy: - Group verbal instruction through game format to discuss how heart and lung disease can affect sexual intimacy. Written material given at graduation..   Know Your Numbers and Heart Failure: - Group verbal and visual instruction to discuss disease risk factors for cardiac and pulmonary disease and treatment options.  Reviews associated critical values for Overweight/Obesity, Hypertension, Cholesterol, and Diabetes.  Discusses basics of heart failure: signs/symptoms and treatments.  Introduces Heart Failure Zone chart for action plan for heart failure.  Written material given at graduation.   Infection Prevention: - Provides verbal and written material to individual with discussion of infection  control including proper hand washing and proper equipment cleaning during exercise session. Flowsheet Row Cardiac Rehab from 07/12/2020 in [facility] Cardiac and Pulmonary Rehab  Date 07/12/20  Educator [facility]  Instruction Review Code 1- Verbalizes Understanding      Falls Prevention: - Provides verbal and written material to  individual with discussion of falls prevention and safety. Flowsheet Row Cardiac Rehab from 07/12/2020 in [facility] Cardiac and Pulmonary Rehab  Date 07/12/20  Educator [facility]  Instruction Review Code 1- Verbalizes Understanding      Other: -Provides group and verbal instruction on various topics (see comments)   Knowledge Questionnaire Score:  Knowledge Questionnaire Score - 07/12/20 1031      Knowledge Questionnaire Score   Pre Score 21/26 Education Focus: Angina, sex, nutrition, exercise           Core Components/Risk Factors/Patient Goals at Admission:  Personal Goals and Risk Factors at Admission - 07/12/20 1032      Core Components/Risk Factors/Patient Goals on Admission    Weight Management Yes;Weight Loss    Intervention Weight Management: Develop a combined nutrition and exercise program designed to reach desired caloric intake, while maintaining appropriate intake of nutrient and fiber, sodium and fats, and appropriate energy expenditure required for the weight goal.;Weight Management: Provide education and appropriate resources to help participant work on and attain dietary goals.;Weight Management/Obesity: Establish reasonable short term and long term weight goals.    Admit Weight 197 lb 3.2 oz (89.4 kg)    Goal Weight: Short Term 192 lb (87.1 kg)    Goal Weight: Long Term 185 lb (83.9 kg)    Expected Outcomes Short Term: Continue to assess and modify interventions until short term weight is achieved;Long Term: Adherence to nutrition and physical activity/exercise program aimed toward attainment of established weight goal;Weight Loss: Understanding of general recommendations for a balanced deficit meal plan, which promotes 1-2 lb weight loss per week and includes a negative energy balance of [phone] kcal/d;Understanding recommendations for meals to include 15-35% energy as protein, 25-35% energy from fat, 35-60% energy from carbohydrates, less than 200mg  of dietary cholesterol,  20-35 gm of total fiber daily;Understanding of distribution of calorie intake throughout the day with the consumption of 4-5 meals/snacks    Heart Failure Yes    Intervention Provide a combined exercise and nutrition program that is supplemented with education, support and counseling about heart failure. Directed toward relieving symptoms such as shortness of breath, decreased exercise tolerance, and extremity edema.    Expected Outcomes Improve functional capacity of life;Short term: Attendance in program 2-3 days a week with increased exercise capacity. Reported lower sodium intake. Reported increased fruit and vegetable intake. Reports medication compliance.;Short term: Daily weights obtained and reported for increase. Utilizing diuretic protocols set by physician.;Long term: Adoption of self-care skills and reduction of barriers for early signs and symptoms recognition and intervention leading to self-care maintenance.    Hypertension Yes    Intervention Provide education on lifestyle modifcations including regular physical activity/exercise, weight management, moderate sodium restriction and increased consumption of fresh fruit, vegetables, and low fat dairy, alcohol moderation, and smoking cessation.;Monitor prescription use compliance.    Expected Outcomes Short Term: Continued assessment and intervention until BP is < 140/20mm HG in hypertensive participants. < 130/63mm HG in hypertensive participants with diabetes, heart failure or chronic kidney disease.;Long Term: Maintenance of blood pressure at goal levels.    Lipids Yes    Intervention Provide education and support for participant on nutrition & aerobic/resistive exercise along with prescribed  medications to achieve LDL 70mg , HDL >40mg .    Expected Outcomes Short Term: Participant states understanding of desired cholesterol values and is compliant with medications prescribed. Participant is following exercise prescription and nutrition  guidelines.;Long Term: Cholesterol controlled with medications as prescribed, with individualized exercise RX and with personalized nutrition plan. Value goals: LDL < 70mg , HDL > 40 mg.           Education:Diabetes - Individual verbal and written instruction to review signs/symptoms of diabetes, desired ranges of glucose level fasting, after meals and with exercise. Acknowledge that pre and post exercise glucose checks will be done for 3 sessions at entry of program.   Core Components/Risk Factors/Patient Goals Review:    Core Components/Risk Factors/Patient Goals at Discharge (Final Review):    ITP Comments:  ITP Comments    Row Name 07/10/20 1550 07/12/20 1026         ITP Comments Initial telephone orientation completed. Diagnosis can be found in [facility] 3/1. EP orientation scheduled for Wed 3/23 at 9:30 Completed 6MWT and gym orientation. Initial ITP created and sent for review to Dr. Emily Filbert, Medical Director.             Comments: Initial ITP

## 2020-07-12 NOTE — Telephone Encounter (Signed)
Rx request sent to pharmacy.  

## 2020-07-12 NOTE — Patient Instructions (Signed)
Patient Instructions  Patient Details  Name: Willie Clark MRN: [id number] Date of Birth: 16-Dec-1950 Referring Provider:  Nelva Bush, MD  Below are your personal goals for exercise, nutrition, and risk factors. Our goal is to help you stay on track towards obtaining and maintaining these goals. We will be discussing your progress on these goals with you throughout the program.  Initial Exercise Prescription:  Initial Exercise Prescription - 07/12/20 1000      Date of Initial Exercise RX and Referring Provider   Date 07/12/20    Referring Provider End, Harrell Gave MD   Primary Cardiologist: Dr. [name]     Treadmill   MPH 2    Grade 0    Minutes 15    METs 2.53      REL-XR   Level 1    Speed 50    Minutes 15    METs 2      T5 Nustep   Level 1    SPM 80    Minutes 15    METs 2      Prescription Details   Frequency (times per week) 3    Duration Progress to 30 minutes of continuous aerobic without signs/symptoms of physical distress      Intensity   THRR 40-80% of Max Heartrate 98-133    Ratings of Perceived Exertion 11-13    Perceived Dyspnea 0-4      Progression   Progression Continue to progress workloads to maintain intensity without signs/symptoms of physical distress.      Resistance Training   Training Prescription Yes    Weight 4 lb    Reps 10-15           Exercise Goals: Frequency: Be able to perform aerobic exercise two to three times per week in program working toward 2-5 days per week of home exercise.  Intensity: Work with a perceived exertion of 11 (fairly light) - 15 (hard) while following your exercise prescription.  We will make changes to your prescription with you as you progress through the program.   Duration: Be able to do 30 to 45 minutes of continuous aerobic exercise in addition to a 5 minute warm-up and a 5 minute cool-down routine.   Nutrition Goals: Your personal nutrition goals will be established when you do your  nutrition analysis with the dietician.  The following are general nutrition guidelines to follow: Cholesterol < 200mg /day Sodium < 1500mg /day Fiber: Men over 50 yrs - 30 grams per day  Personal Goals:  Personal Goals and Risk Factors at Admission - 07/12/20 1032      Core Components/Risk Factors/Patient Goals on Admission    Weight Management Yes;Weight Loss    Intervention Weight Management: Develop a combined nutrition and exercise program designed to reach desired caloric intake, while maintaining appropriate intake of nutrient and fiber, sodium and fats, and appropriate energy expenditure required for the weight goal.;Weight Management: Provide education and appropriate resources to help participant work on and attain dietary goals.;Weight Management/Obesity: Establish reasonable short term and long term weight goals.    Admit Weight 197 lb 3.2 oz (89.4 kg)    Goal Weight: Short Term 192 lb (87.1 kg)    Goal Weight: Long Term 185 lb (83.9 kg)    Expected Outcomes Short Term: Continue to assess and modify interventions until short term weight is achieved;Long Term: Adherence to nutrition and physical activity/exercise program aimed toward attainment of established weight goal;Weight Loss: Understanding of general recommendations for a balanced deficit  meal plan, which promotes 1-2 lb weight loss per week and includes a negative energy balance of [phone] kcal/d;Understanding recommendations for meals to include 15-35% energy as protein, 25-35% energy from fat, 35-60% energy from carbohydrates, less than 200mg  of dietary cholesterol, 20-35 gm of total fiber daily;Understanding of distribution of calorie intake throughout the day with the consumption of 4-5 meals/snacks    Heart Failure Yes    Intervention Provide a combined exercise and nutrition program that is supplemented with education, support and counseling about heart failure. Directed toward relieving symptoms such as shortness of breath,  decreased exercise tolerance, and extremity edema.    Expected Outcomes Improve functional capacity of life;Short term: Attendance in program 2-3 days a week with increased exercise capacity. Reported lower sodium intake. Reported increased fruit and vegetable intake. Reports medication compliance.;Short term: Daily weights obtained and reported for increase. Utilizing diuretic protocols set by physician.;Long term: Adoption of self-care skills and reduction of barriers for early signs and symptoms recognition and intervention leading to self-care maintenance.    Hypertension Yes    Intervention Provide education on lifestyle modifcations including regular physical activity/exercise, weight management, moderate sodium restriction and increased consumption of fresh fruit, vegetables, and low fat dairy, alcohol moderation, and smoking cessation.;Monitor prescription use compliance.    Expected Outcomes Short Term: Continued assessment and intervention until BP is < 140/79mm HG in hypertensive participants. < 130/67mm HG in hypertensive participants with diabetes, heart failure or chronic kidney disease.;Long Term: Maintenance of blood pressure at goal levels.    Lipids Yes    Intervention Provide education and support for participant on nutrition & aerobic/resistive exercise along with prescribed medications to achieve LDL 70mg , HDL >40mg .    Expected Outcomes Short Term: Participant states understanding of desired cholesterol values and is compliant with medications prescribed. Participant is following exercise prescription and nutrition guidelines.;Long Term: Cholesterol controlled with medications as prescribed, with individualized exercise RX and with personalized nutrition plan. Value goals: LDL < 70mg , HDL > 40 mg.           Tobacco Use Initial Evaluation: Social History   Tobacco Use  Smoking Status Former Smoker  . Packs/day: 2.50  . Types: Cigarettes  . Quit date: 08/21/1991  . Years since  quitting: 28.9  Smokeless Tobacco Never Used    Exercise Goals and Review:  Exercise Goals    Row Name 07/12/20 1029             Exercise Goals   Increase Physical Activity Yes       Intervention Provide advice, education, support and counseling about physical activity/exercise needs.;Develop an individualized exercise prescription for aerobic and resistive training based on initial evaluation findings, risk stratification, comorbidities and participant's personal goals.       Expected Outcomes Short Term: Attend rehab on a regular basis to increase amount of physical activity.;Long Term: Add in home exercise to make exercise part of routine and to increase amount of physical activity.;Long Term: Exercising regularly at least 3-5 days a week.       Increase Strength and Stamina Yes       Intervention Provide advice, education, support and counseling about physical activity/exercise needs.;Develop an individualized exercise prescription for aerobic and resistive training based on initial evaluation findings, risk stratification, comorbidities and participant's personal goals.       Expected Outcomes Short Term: Increase workloads from initial exercise prescription for resistance, speed, and METs.;Short Term: Perform resistance training exercises routinely during rehab and add in resistance training  at home;Long Term: Improve cardiorespiratory fitness, muscular endurance and strength as measured by increased METs and functional capacity (6MWT)       Able to understand and use rate of perceived exertion (RPE) scale Yes       Intervention Provide education and explanation on how to use RPE scale       Expected Outcomes Short Term: Able to use RPE daily in rehab to express subjective intensity level;Long Term:  Able to use RPE to guide intensity level when exercising independently       Able to understand and use Dyspnea scale Yes       Intervention Provide education and explanation on how to use  Dyspnea scale       Expected Outcomes Short Term: Able to use Dyspnea scale daily in rehab to express subjective sense of shortness of breath during exertion;Long Term: Able to use Dyspnea scale to guide intensity level when exercising independently       Knowledge and understanding of Target Heart Rate Range (THRR) Yes       Intervention Provide education and explanation of THRR including how the numbers were predicted and where they are located for reference       Expected Outcomes Short Term: Able to state/look up THRR;Long Term: Able to use THRR to govern intensity when exercising independently;Short Term: Able to use daily as guideline for intensity in rehab       Able to check pulse independently Yes       Intervention Provide education and demonstration on how to check pulse in carotid and radial arteries.;Review the importance of being able to check your own pulse for safety during independent exercise       Expected Outcomes Short Term: Able to explain why pulse checking is important during independent exercise;Long Term: Able to check pulse independently and accurately       Understanding of Exercise Prescription Yes       Intervention Provide education, explanation, and written materials on patient's individual exercise prescription       Expected Outcomes Short Term: Able to explain program exercise prescription;Long Term: Able to explain home exercise prescription to exercise independently              Copy of goals given to participant.

## 2020-07-14 ENCOUNTER — Other Ambulatory Visit: Payer: Self-pay

## 2020-07-14 ENCOUNTER — Encounter: Payer: Medicare HMO | Admitting: *Deleted

## 2020-07-14 DIAGNOSIS — Z955 Presence of coronary angioplasty implant and graft: Secondary | ICD-10-CM

## 2020-07-14 DIAGNOSIS — I214 Non-ST elevation (NSTEMI) myocardial infarction: Secondary | ICD-10-CM

## 2020-07-14 NOTE — Progress Notes (Signed)
Daily Session Note  Patient Details  Name: Willie Clark MRN: [id number] Date of Birth: January 09, 1951 Referring Provider:   Flowsheet Row Cardiac Rehab from 07/12/2020 in [facility] Cardiac and Pulmonary Rehab  Referring Provider End, Harrell Gave MD  [facility] Cardiologist: Dr. Rogue Jury Arida]      Encounter Date: 07/14/2020  Check In:  Session Check In - 07/14/20 0953      Check-In   Supervising physician immediately available to respond to emergencies See telemetry face sheet for immediately available ER MD    Location [facility]-Cardiac & Pulmonary Rehab    Staff Present Alberteen Sam, MA, RCEP, CCRP, CCET;Joseph [place];Heath Lark, RN, BSN, CCRP    Virtual Visit No    Medication changes reported     No    Fall or balance concerns reported    No    Warm-up and Cool-down Performed on first and last piece of equipment    Resistance Training Performed Yes    VAD Patient? No    PAD/SET Patient? No      Pain Assessment   Currently in Pain? No/denies              Social History   Tobacco Use  Smoking Status Former Smoker  . Packs/day: 2.50  . Types: Cigarettes  . Quit date: 08/21/1991  . Years since quitting: 28.9  Smokeless Tobacco Never Used    Goals Met:  Exercise tolerated well Personal goals reviewed No report of cardiac concerns or symptoms  Goals Unmet:  Not Applicable  Comments: First full day of exercise!  Patient was oriented to gym and equipment including functions, settings, policies, and procedures.  Patient's individual exercise prescription and treatment plan were reviewed.  All starting workloads were established based on the results of the 6 minute walk test done at initial orientation visit.  The plan for exercise progression was also introduced and progression will be customized based on patient's performance and goals.    Dr. Emily Filbert is Medical Director for [place] and [facility].

## 2020-07-17 ENCOUNTER — Encounter: Payer: Medicare HMO | Admitting: *Deleted

## 2020-07-17 ENCOUNTER — Other Ambulatory Visit: Payer: Self-pay

## 2020-07-17 DIAGNOSIS — I214 Non-ST elevation (NSTEMI) myocardial infarction: Secondary | ICD-10-CM

## 2020-07-17 DIAGNOSIS — Z955 Presence of coronary angioplasty implant and graft: Secondary | ICD-10-CM

## 2020-07-17 NOTE — Progress Notes (Signed)
Daily Session Note  Patient Details  Name: BARACK NICODEMUS MRN: [id number] Date of Birth: 13-Aug-1950 Referring Provider:   Flowsheet Row Cardiac Rehab from 07/12/2020 in [facility] Cardiac and Pulmonary Rehab  Referring Provider End, Harrell Gave MD  [facility] Cardiologist: Dr. Rogue Jury Arida]      Encounter Date: 07/17/2020  Check In:  Session Check In - 07/17/20 1027      Check-In   Supervising physician immediately available to respond to emergencies See telemetry face sheet for immediately available ER MD    Location [facility]-Cardiac & Pulmonary Rehab    Staff Present Justin Mend Jaci Carrel, BS, ACSM CEP, Exercise Physiologist;Brixon Zhen, RN, BSN, CCRP    Virtual Visit No    Medication changes reported     No    Fall or balance concerns reported    No    Warm-up and Cool-down Performed on first and last piece of equipment    Resistance Training Performed Yes    VAD Patient? No    PAD/SET Patient? No      Pain Assessment   Currently in Pain? No/denies              Social History   Tobacco Use  Smoking Status Former Smoker  . Packs/day: 2.50  . Types: Cigarettes  . Quit date: 08/21/1991  . Years since quitting: 28.9  Smokeless Tobacco Never Used    Goals Met:  Independence with exercise equipment Exercise tolerated well No report of cardiac concerns or symptoms  Goals Unmet:  Not Applicable  Comments: Pt able to follow exercise prescription today without complaint.  Will continue to monitor for progression.    Dr. Emily Filbert is Medical Director for [place] and [facility].

## 2020-07-19 ENCOUNTER — Other Ambulatory Visit: Payer: Self-pay

## 2020-07-19 DIAGNOSIS — I214 Non-ST elevation (NSTEMI) myocardial infarction: Secondary | ICD-10-CM | POA: Diagnosis not present

## 2020-07-19 DIAGNOSIS — Z955 Presence of coronary angioplasty implant and graft: Secondary | ICD-10-CM

## 2020-07-19 NOTE — Progress Notes (Signed)
Daily Session Note  Patient Details  Name: Willie Clark MRN: [id number] Date of Birth: 02/23/1951 Referring Provider:   Flowsheet Row Cardiac Rehab from 07/12/2020 in [facility] Cardiac and Pulmonary Rehab  Referring Provider End, Harrell Gave MD  [facility] Cardiologist: Dr. Rogue Jury Arida]      Encounter Date: 07/19/2020  Check In:  Session Check In - 07/19/20 0945      Check-In   Supervising physician immediately available to respond to emergencies See telemetry face sheet for immediately available ER MD    Location [facility]-Cardiac & Pulmonary Rehab    Staff Present Birdie Sons, MPA, RN;Melissa Caiola RDN, Rowe Pavy, BA, ACSM CEP, Exercise Physiologist    Virtual Visit No    Medication changes reported     No    Fall or balance concerns reported    No    Warm-up and Cool-down Performed on first and last piece of equipment    Resistance Training Performed Yes    VAD Patient? No    PAD/SET Patient? No      Pain Assessment   Currently in Pain? No/denies              Social History   Tobacco Use  Smoking Status Former Smoker  . Packs/day: 2.50  . Types: Cigarettes  . Quit date: 08/21/1991  . Years since quitting: 28.9  Smokeless Tobacco Never Used    Goals Met:  Independence with exercise equipment Exercise tolerated well No report of cardiac concerns or symptoms Strength training completed today  Goals Unmet:  Not Applicable  Comments: Pt able to follow exercise prescription today without complaint.  Will continue to monitor for progression.    Dr. Emily Filbert is Medical Director for [place] and [facility].

## 2020-07-21 ENCOUNTER — Other Ambulatory Visit: Payer: Self-pay

## 2020-07-21 ENCOUNTER — Encounter
Admission: RE | Admit: 2020-07-21 | Discharge: 2020-07-21 | Disposition: A | Discharge status: Home / Self Care | Payer: Medicare HMO | Source: Ambulatory Visit | Attending: Gastroenterology | Admitting: Gastroenterology

## 2020-07-21 DIAGNOSIS — R6881 Early satiety: Secondary | ICD-10-CM | POA: Insufficient documentation

## 2020-07-21 DIAGNOSIS — K219 Gastro-esophageal reflux disease without esophagitis: Secondary | ICD-10-CM | POA: Diagnosis present

## 2020-07-21 MED ORDER — TECHNETIUM TC 99M SULFUR COLLOID
2.0000 | Freq: Once | INTRAVENOUS | Status: AC | PRN
  Administered 2020-07-21: 2.2 via ORAL

## 2020-07-24 ENCOUNTER — Other Ambulatory Visit: Payer: Self-pay

## 2020-07-24 ENCOUNTER — Encounter: Payer: Medicare HMO | Attending: Internal Medicine | Admitting: *Deleted

## 2020-07-24 DIAGNOSIS — Z955 Presence of coronary angioplasty implant and graft: Secondary | ICD-10-CM | POA: Insufficient documentation

## 2020-07-24 DIAGNOSIS — I214 Non-ST elevation (NSTEMI) myocardial infarction: Secondary | ICD-10-CM | POA: Diagnosis present

## 2020-07-24 NOTE — Progress Notes (Signed)
Daily Session Note  Patient Details  Name: Willie Clark MRN: [id number] Date of Birth: 11-17-1950 Referring Provider:   Flowsheet Row Cardiac Rehab from 07/12/2020 in [facility] Cardiac and Pulmonary Rehab  Referring Provider End, Harrell Gave MD  [facility] Cardiologist: Dr. Rogue Jury Arida]      Encounter Date: 07/24/2020  Check In:  Session Check In - 07/24/20 1034      Check-In   Supervising physician immediately available to respond to emergencies See telemetry face sheet for immediately available ER MD    Location [facility]-Cardiac & Pulmonary Rehab    Staff Present Renita Papa, RN BSN;Joseph [address] [place], [place], ACSM CEP, Exercise Physiologist    Virtual Visit No    Medication changes reported     No    Fall or balance concerns reported    No    Warm-up and Cool-down Performed on first and last piece of equipment    Resistance Training Performed Yes    VAD Patient? No    PAD/SET Patient? No      Pain Assessment   Currently in Pain? No/denies              Social History   Tobacco Use  Smoking Status Former Smoker  . Packs/day: 2.50  . Types: Cigarettes  . Quit date: 08/21/1991  . Years since quitting: 28.9  Smokeless Tobacco Never Used    Goals Met:  Independence with exercise equipment Exercise tolerated well No report of cardiac concerns or symptoms Strength training completed today  Goals Unmet:  Not Applicable  Comments: Pt able to follow exercise prescription today without complaint.  Will continue to monitor for progression.    Dr. Emily Filbert is Medical Director for [place] and [facility].

## 2020-07-28 ENCOUNTER — Other Ambulatory Visit: Payer: Self-pay

## 2020-07-28 ENCOUNTER — Encounter: Payer: Medicare HMO | Admitting: *Deleted

## 2020-07-28 DIAGNOSIS — I214 Non-ST elevation (NSTEMI) myocardial infarction: Secondary | ICD-10-CM | POA: Diagnosis not present

## 2020-07-28 DIAGNOSIS — Z955 Presence of coronary angioplasty implant and graft: Secondary | ICD-10-CM

## 2020-07-28 NOTE — Progress Notes (Signed)
Daily Session Note  Patient Details  Name: DANIELE YANKOWSKI MRN: [id number] Date of Birth: 11-02-1950 Referring Provider:   Flowsheet Row Cardiac Rehab from 07/12/2020 in [facility] Cardiac and Pulmonary Rehab  Referring Provider End, Harrell Gave MD  [facility] Cardiologist: Dr. Rogue Jury Arida]      Encounter Date: 07/28/2020  Check In:  Session Check In - 07/28/20 1011      Check-In   Supervising physician immediately available to respond to emergencies See telemetry face sheet for immediately available ER MD    Location [facility]-Cardiac & Pulmonary Rehab    Staff Present Renita Papa, RN BSN;Joseph [address] [place], [place], RCEP, CCRP, CCET    Virtual Visit No    Medication changes reported     No    Fall or balance concerns reported    No    Warm-up and Cool-down Performed on first and last piece of equipment    Resistance Training Performed Yes    VAD Patient? No    PAD/SET Patient? No      Pain Assessment   Currently in Pain? No/denies              Social History   Tobacco Use  Smoking Status Former Smoker  . Packs/day: 2.50  . Types: Cigarettes  . Quit date: 08/21/1991  . Years since quitting: 28.9  Smokeless Tobacco Never Used    Goals Met:  Independence with exercise equipment Exercise tolerated well No report of cardiac concerns or symptoms Strength training completed today  Goals Unmet:  Not Applicable  Comments: Pt able to follow exercise prescription today without complaint.  Will continue to monitor for progression.    Dr. Emily Filbert is Medical Director for [place] and [facility].

## 2020-07-31 ENCOUNTER — Other Ambulatory Visit: Payer: Self-pay

## 2020-07-31 ENCOUNTER — Other Ambulatory Visit: Payer: Self-pay | Admitting: Family

## 2020-07-31 ENCOUNTER — Encounter: Payer: Medicare HMO | Admitting: *Deleted

## 2020-07-31 DIAGNOSIS — I214 Non-ST elevation (NSTEMI) myocardial infarction: Secondary | ICD-10-CM

## 2020-07-31 DIAGNOSIS — Z955 Presence of coronary angioplasty implant and graft: Secondary | ICD-10-CM

## 2020-07-31 DIAGNOSIS — I5042 Chronic combined systolic (congestive) and diastolic (congestive) heart failure: Secondary | ICD-10-CM

## 2020-07-31 NOTE — Progress Notes (Signed)
Daily Session Note  Patient Details  Name: Willie Clark MRN: [id number] Date of Birth: [birth year] Referring Provider:   Flowsheet Row Cardiac Rehab from 07/12/2020 in [facility] Cardiac and Pulmonary Rehab  Referring Provider End, Harrell Gave MD  [facility] Cardiologist: Dr. Rogue Jury Arida]      Encounter Date: 07/31/2020  Check In:  Session Check In - 07/31/20 1058      Check-In   Supervising physician immediately available to respond to emergencies See telemetry face sheet for immediately available ER MD    Location [facility]-Cardiac & Pulmonary Rehab    Staff Present Renita Papa, RN BSN;Joseph Tedd S[place], [place], ACSM CEP, Exercise Physiologist;Kelly Rosalia Hammers, MPA, RN    Virtual Visit No    Medication changes reported     No    Fall or balance concerns reported    No    Warm-up and Cool-down Performed on first and last piece of equipment    Resistance Training Performed Yes    VAD Patient? No    PAD/SET Patient? No      Pain Assessment   Currently in Pain? No/denies              Social History   Tobacco Use  Smoking Status Former Smoker  . Packs/day: 2.50  . Types: Cigarettes  . Quit date: 08/21/1991  . Years since quitting: 28.9  Smokeless Tobacco Never Used    Goals Met:  Independence with exercise equipment Exercise tolerated well No report of cardiac concerns or symptoms Strength training completed today  Goals Unmet:  Not Applicable  Comments: Pt able to follow exercise prescription today without complaint.  Will continue to monitor for progression.    Dr. Emily Filbert is Medical Director for [place] and [facility].

## 2020-08-02 ENCOUNTER — Other Ambulatory Visit: Payer: Self-pay

## 2020-08-02 DIAGNOSIS — I214 Non-ST elevation (NSTEMI) myocardial infarction: Secondary | ICD-10-CM | POA: Diagnosis not present

## 2020-08-02 DIAGNOSIS — Z955 Presence of coronary angioplasty implant and graft: Secondary | ICD-10-CM

## 2020-08-02 NOTE — Progress Notes (Signed)
Daily Session Note  Patient Details  Name: LYRICK LAGRAND MRN: [id number] Date of Birth: 1950-10-25 Referring Provider:   Flowsheet Row Cardiac Rehab from 07/12/2020 in [facility] Cardiac and Pulmonary Rehab  Referring Provider End, Harrell Gave MD  [facility] Cardiologist: Dr. Rogue Jury Arida]      Encounter Date: 08/02/2020  Check In:  Session Check In - 08/02/20 0947      Check-In   Supervising physician immediately available to respond to emergencies See telemetry face sheet for immediately available ER MD    Location [facility]-Cardiac & Pulmonary Rehab    Staff Present Birdie Sons, MPA, RN;Amanda Oletta Darter, BA, ACSM CEP, Exercise Physiologist;Joseph Tessie Fass RCP,RRT,BSRT    Virtual Visit No    Medication changes reported     No    Fall or balance concerns reported    No    Warm-up and Cool-down Performed on first and last piece of equipment    Resistance Training Performed Yes    VAD Patient? No    PAD/SET Patient? No      Pain Assessment   Currently in Pain? No/denies              Social History   Tobacco Use  Smoking Status Former Smoker  . Packs/day: 2.50  . Types: Cigarettes  . Quit date: 08/21/1991  . Years since quitting: 28.9  Smokeless Tobacco Never Used    Goals Met:  Independence with exercise equipment Exercise tolerated well No report of cardiac concerns or symptoms Strength training completed today  Goals Unmet:  Not Applicable  Comments: Pt able to follow exercise prescription today without complaint.  Will continue to monitor for progression.    Dr. Emily Filbert is Medical Director for [facility].

## 2020-08-04 ENCOUNTER — Encounter: Payer: Medicare HMO | Admitting: *Deleted

## 2020-08-04 ENCOUNTER — Other Ambulatory Visit: Payer: Self-pay

## 2020-08-04 DIAGNOSIS — I214 Non-ST elevation (NSTEMI) myocardial infarction: Secondary | ICD-10-CM

## 2020-08-04 DIAGNOSIS — Z955 Presence of coronary angioplasty implant and graft: Secondary | ICD-10-CM

## 2020-08-04 NOTE — Progress Notes (Signed)
Daily Session Note  Patient Details  Name: Willie Clark MRN: [id number] Date of Birth: [birth year] Referring Provider:   Flowsheet Row Cardiac Rehab from 07/12/2020 in [facility] Cardiac and Pulmonary Rehab  Referring Provider End, Harrell Gave MD  [facility] Cardiologist: Dr. Rogue Jury Arida]      Encounter Date: 08/04/2020  Check In:  Session Check In - 08/04/20 0941      Check-In   Supervising physician immediately available to respond to emergencies See telemetry face sheet for immediately available ER MD    Location [facility]-Cardiac & Pulmonary Rehab    Staff Present Renita Papa, RN BSN;Joseph Hood RCP,RRT,BSRT;Melissa Richlands RDN, LDN    Virtual Visit No    Medication changes reported     No    Fall or balance concerns reported    No    Warm-up and Cool-down Performed on first and last piece of equipment    Resistance Training Performed Yes    VAD Patient? No    PAD/SET Patient? No      Pain Assessment   Currently in Pain? No/denies              Social History   Tobacco Use  Smoking Status Former Smoker  . Packs/day: 2.50  . Types: Cigarettes  . Quit date: 08/21/1991  . Years since quitting: 28.9  Smokeless Tobacco Never Used    Goals Met:  Independence with exercise equipment Exercise tolerated well No report of cardiac concerns or symptoms Strength training completed today  Goals Unmet:  Not Applicable  Comments: Pt able to follow exercise prescription today without complaint.  Will continue to monitor for progression.    Dr. Emily Filbert is Medical Director for [facility].

## 2020-08-07 ENCOUNTER — Ambulatory Visit: Payer: Medicare HMO | Admitting: Medical

## 2020-08-07 ENCOUNTER — Other Ambulatory Visit: Payer: Self-pay

## 2020-08-07 ENCOUNTER — Encounter: Payer: Self-pay | Admitting: Medical

## 2020-08-07 VITALS — BP 120/70 | HR 62 | Ht 69.0 in | Wt 202.0 lb

## 2020-08-07 DIAGNOSIS — I25110 Atherosclerotic heart disease of native coronary artery with unstable angina pectoris: Secondary | ICD-10-CM | POA: Diagnosis not present

## 2020-08-07 DIAGNOSIS — G4733 Obstructive sleep apnea (adult) (pediatric): Secondary | ICD-10-CM | POA: Diagnosis not present

## 2020-08-07 DIAGNOSIS — I5022 Chronic systolic (congestive) heart failure: Secondary | ICD-10-CM

## 2020-08-07 DIAGNOSIS — I10 Essential (primary) hypertension: Secondary | ICD-10-CM

## 2020-08-07 MED ORDER — FUROSEMIDE 20 MG PO TABS: 20.0000 mg | ORAL_TABLET | Freq: Every day | ORAL | 3 refills | Status: DC

## 2020-08-07 MED ORDER — POTASSIUM CHLORIDE ER 10 MEQ PO TBCR: 10.0000 meq | EXTENDED_RELEASE_TABLET | Freq: Every day | ORAL | 3 refills | Status: DC

## 2020-08-07 NOTE — Progress Notes (Signed)
Cardiology Office Note:    Date:  08/07/2020   ID:  Willie Clark, DOB 1950-06-24, MRN [id number]  PCP:  [name], MD  [facility] Cardiologist:  [name], MD  [facility] Electrophysiologist:  None   Referring MD: [name], MD   Chief Complaint: 1 month follow-up  History of Present Illness:    Willie Clark is a 70 y.o. male with a hx of CAD status post CABG in 1993 with redo in 2007 s/p PCI to SVG-RCA 06/20/20, HFrEF/ICM, hyperlipidemia, hypertension, GERD, nephrolithiasis, OSA, stroke, prior postop PE, PFO closure 2007 presents for follow-up.  Patient was hospitalized in August 2021 with chest pain.  Echo showed EF 40 to 45%, cath showed severe underlying three-vessel CAD with patent grafts and no significant change since 2017.  The patient's meds were switched to losartan and carvedilol.  Rosuvastatin was increased to 40 mg daily.  Losartan was switched to [facility] however patient had allergic reaction.  Arlyce Harman was added.  Was seen in the office 06/20/2020 by Dr. Fletcher Anon and was having active chest pain.  EKG showed minor inferior ST elevation with reciprocal changes and he was brought up to the Cath Lab for emergent cath.  Cath showed CTO ostial LAD and RCA, mild to moderate ISR left circumflex stent, widely patent LIMA to LAD, patent SVG to OM 2 with 60 to 70% ostial/proximal graft stenosis, subtotally occluded SVG-RCA with long segmental proximal graft disease 89% with TIMI flow 1, moderately reduced EF with inferior akinesis.  He was treated with successful PCI to SVG-RCA with overlapping stents x3.  For recurrent chest pain can consider PCI to vein graft to OM 2.  He was started on DAPT with aspirin and Brilinta plan to continue for 12 months.  Echo showed EF 35 to 40%.  He was continued on Coreg and losartan.  Patient was seen 06/29/2020 and had some shortness of breath and chest tightness.  Imdur was added.  He was euvolemic on exam.  Today, the patient  reports shortness of breath and chest tightness. Its been persistent since the heart attack. He has been going to cardiac rehab and is able to complete the exercises, but gets really tired. Chest tightness is not the same as the heart attack.  Has not taken NTG. Also has orthopnea, LLE, and nasuea, no vomiting. Takes ondansetron. Has lower leg edema, has been going on before and after MI. Wears compression stockings at times, which help. Eats low salt diet. Weight is up 4 lbs since Saturday and 8 lbs since the last visit. Does not think weight gain from food. Generally eating healthy. Prior to admission he was on lasix and potassium. It was stopped after discharge. He was generally taking it once a week.   Past Medical History:  Diagnosis Date  . Anxiety   . Arthritis   . Barrett's esophagus   . Bowel obstruction ([place]) 02/2009   small bowel  . Chronic chest pain   . Coronary artery disease    a. 1993 CABG; b. 2007 Redo CABG; c. 09/2015 Cath: LM mild dzs, LAD 100ost, RI mild dzs, LCX 60p, OM1 100, RCA 100ost, VG->OM2 min irregs, LIMA->LAD nl, VG->RPDA 30ost, 20d, nl EF->Med rx.  . Depression   . Diverticulitis   . Diverticulosis   . Erectile dysfunction   . Fibromyalgia   . Gastric ulcer   . Gastritis   . GERD (gastroesophageal reflux disease)   . Headache   . Hemorrhoids   .  HFrEF (heart failure with reduced ejection fraction) ([place])    a. 09/2016 Echo: EF 55-60%, no rwma, triv AI, mild MR, mildly dil LA. -> as of April 2020: EF 40 to 45%.  Septal dyssynergy/hypokinesis due to postop state but otherwise unable to assess wall motion [name] due to poor study.  . History of myocardial infarct at age less than 74 years   . Hyperlipidemia   . Hypertension   . Kidney stones    going to [facility]  June 26 to see kidney specialist  . Lightheadedness   . Mass on back   . Nephrolithiasis   . Orthostatic hypotension    a. Improved after discontinuation of metoprolol.  . OSA (obstructive sleep  apnea)    a. On CPAP.  [name] Pulmonary embolism ([place]) 01/2006   Post-op/treated  . Skin lesions, generalized   . Stroke [facility]) 1993   right side weakness no blood thinners   on Aspirin  . Syncope and collapse   . Urticaria     Past Surgical History:  Procedure Laterality Date  . BACK SURGERY    . bowel obstruction  01/2009  . CARDIAC CATHETERIZATION  11/2006   patent grafts. Significant OM3 disease and occluded diagonals.  [name] CARDIAC CATHETERIZATION  06/2010   patent grafts. significant ISR in proximal LCX but OM2 is bypassed and gives retrograde flow to OM3.   [name] CARDIAC CATHETERIZATION  06/2013   [facility]: Patent grafts with 60% proximal in-stent restenosis in the left circumflex with FFR of 0.85  . CARDIAC CATHETERIZATION Left 09/25/2015   Procedure: Left Heart Cath and Cors/Grafts Angiography;  Surgeon: Wellington Hampshire, MD;  Location: [place] CV LAB;  Service: Cardiovascular;  Laterality: Left;  . CERVICAL FUSION    . CHOLECYSTECTOMY    . COLON SURGERY  01/2009   BOWEL RESECTION DUE TO SMALL BOWEL OBSTRUCTION  . COLONOSCOPY    . COLONOSCOPY WITH PROPOFOL N/A 10/28/2017   Procedure: COLONOSCOPY WITH PROPOFOL;  Surgeon: Lollie Sails, MD;  Location: [facility] ENDOSCOPY;  Service: Endoscopy;  Laterality: N/A;  . CORONARY ANGIOPLASTY  2006  . CORONARY ARTERY BYPASS GRAFT  1993/01/2006   redo at [facility]. LIMA to LAD, SVG to OM2 and SVG to RPDA  . CORONARY STENT INTERVENTION N/A 06/20/2020   Procedure: CORONARY STENT INTERVENTION;  Surgeon: Nelva Bush, MD;  Location: [place] CV LAB;  Service: Cardiovascular;  Laterality: N/A;  . CYSTOSCOPY WITH STENT PLACEMENT Bilateral 09/24/2015   Procedure: CYSTOSCOPY WITH BILATERAL RETROGRADES, BILATERAL STENT PLACEMENT;  Surgeon: Festus Aloe, MD;  Location: [facility] ORS;  Service: Urology;  Laterality: Bilateral;  . ESOPHAGOGASTRODUODENOSCOPY N/A 10/25/2014   Procedure: ESOPHAGOGASTRODUODENOSCOPY (EGD);  Surgeon: Lollie Sails, MD;  Location:  [facility] ENDOSCOPY;  Service: Endoscopy;  Laterality: N/A;  . ESOPHAGOGASTRODUODENOSCOPY (EGD) WITH PROPOFOL N/A 02/03/2017   Procedure: ESOPHAGOGASTRODUODENOSCOPY (EGD) WITH PROPOFOL;  Surgeon: Lollie Sails, MD;  Location: [facility] ENDOSCOPY;  Service: Endoscopy;  Laterality: N/A;  . ESOPHAGOGASTRODUODENOSCOPY (EGD) WITH PROPOFOL N/A 04/18/2017   Procedure: ESOPHAGOGASTRODUODENOSCOPY (EGD) WITH PROPOFOL;  Surgeon: Lollie Sails, MD;  Location: [facility] ENDOSCOPY;  Service: Endoscopy;  Laterality: N/A;  . EYE SURGERY Bilateral    Cataract Extraction with IOL  . HERNIA REPAIR    . INGUINAL HERNIA REPAIR Bilateral 01/23/2016   Procedure: HERNIA REPAIR INGUINAL ADULT BILATERAL;  Surgeon: Leonie Green, MD;  Location: [facility] ORS;  Service: General;  Laterality: Bilateral;  . LEFT HEART CATH AND CORS/GRAFTS ANGIOGRAPHY N/A 12/13/2019   Procedure: LEFT HEART  CATH AND CORS/GRAFTS ANGIOGRAPHY;  Surgeon: Wellington Hampshire, MD;  Location: [place] CV LAB;  Service: Cardiovascular;  Laterality: N/A;  . LEFT HEART CATH AND CORS/GRAFTS ANGIOGRAPHY N/A 06/20/2020   Procedure: LEFT HEART CATH AND CORS/GRAFTS ANGIOGRAPHY;  Surgeon: Nelva Bush, MD;  Location: [place] CV LAB;  Service: Cardiovascular;  Laterality: N/A;  . LUMBAR LAMINECTOMY/DECOMPRESSION MICRODISCECTOMY Left 10/01/2016   Procedure: Left Lumbar four-five Laminotomy for resection of synovial cyst;  Surgeon: Erline Levine, MD;  Location: [place];  Service: Neurosurgery;  Laterality: Left;  left  . NECK SURGERY  06/2009  . SHOULDER SURGERY  2010    Current Medications: No outpatient medications have been marked as taking for the 08/07/20 encounter (Appointment) with Kathlen Mody, Davian Wollenberg H, PA-C.     Allergies:   Amitriptyline, Cyclobenzaprine, Dicyclomine, Sacubitril-valsartan, Acetaminophen, Amitriptyline hcl, Bacon flavor, Hydrocodone, Fish oil, Lidocaine, Omega-3 fatty acids-vitamin e, Other, Oxycodone-acetaminophen, and Sulfa antibiotics    Social History   Socioeconomic History  . Marital status: Married    Spouse name: Not on file  . Number of children: Not on file  . Years of education: Not on file  . Highest education level: Not on file  Occupational History  . Not on file  Tobacco Use  . Smoking status: Former Smoker    Packs/day: 2.50    Types: Cigarettes    Quit date: 08/21/1991    Years since quitting: 28.9  . Smokeless tobacco: Never Used  Vaping Use  . Vaping Use: Never used  Substance and Sexual Activity  . Alcohol use: Not Currently  . Drug use: No  . Sexual activity: Not on file  Other Topics Concern  . Not on file  Social History Narrative  . Not on file   Social Determinants of Health   Financial Resource Strain: Not on file  Food Insecurity: Not on file  Transportation Needs: Not on file  Physical Activity: Not on file  Stress: Not on file  Social Connections: Not on file     Family History: The patient's *family history includes Heart attack in his father; Heart disease (age of onset: 109) in his father.  ROS:   Please see the history of present illness.     All other systems reviewed and are negative.  EKGs/Labs/Other Studies Reviewed:    The following studies were reviewed today: Echo 06/20/2020 1. Left ventricular ejection fraction, by estimation, is 35 to 40%. The  left ventricle has moderately decreased function. The left ventricle  demonstrates regional wall motion abnormalities (see scoring  diagram/findings for description). Left ventricular  diastolic parameters are consistent with Grade I diastolic dysfunction  (impaired relaxation). There is severe hypokinesis of the left  ventricular, entire inferolateral wall.  2. Right ventricular systolic function is low normal. The right  ventricular size is normal.  3. The mitral valve is normal in structure. No evidence of mitral valve  regurgitation.  4. The aortic valve is tricuspid. Aortic valve regurgitation is mild.   5. The inferior vena cava is normal in size with greater than 50%  respiratory variability, suggesting right atrial pressure of 3 mmHg.   Cardiac cath 06/20/20 Conclusions: 1. Severe native CAD, including chronic total occlusions of the ostial LAD and RCA. 2. Patent LCx stent with mild to moderate in-stent restenosis. 3. Widely patent LIMA-LAD. 4. Patent SVG-OM2 with 60-70% ostial/proximal graft stenosis. 5. Subtotally occluded SVG-RCA with long segment of proximal graft disease of up to 80% and 99% stenosis at the distal anastomosis with  TIMI-1 flow. 6. Moderately reduced left ventricular contraction with inferior akinesis. Normal left ventricular filling pressure. 7. Successful PCI to SVG-RCA with overlapping Resolute Onyx 4.0 x 26 mm and 4.0 x 38 mm proximal and 2.5 x 18 mm distal (extending into rPDA) drug-eluting stents.  Recommendations: 1. Continue tirofiban infusion x 6 hours. 2. Dual antiplatelet therapy with aspirin and tigagrelor for at least 12 months. 3. Aggressive secondary prevention. 4. If patient has recurrent chest pain, consider PCI to SVG-OM2. 5. Follow-up echocardiogram.  Nelva Bush, MD [facility]   Coronary Diagrams   Diagnostic Dominance: Right    Intervention        EKG:  EKG is  ordered today.  The ekg ordered today demonstrates NSR 62 bpm. TWI inf leads  Recent Labs: 12/12/2019: Magnesium 2.7 06/20/2020: ALT 33 06/21/2020: Hemoglobin 13.7; Platelets 153 07/12/2020: BUN 11; Creatinine, Ser 0.92; Potassium 3.9; Sodium 138  Recent Lipid Panel    Component Value Date/Time   CHOL 165 02/07/2020 0658   TRIG 317 (H) 02/07/2020 0658   HDL 41 02/07/2020 0658   CHOLHDL 4.0 02/07/2020 0658   VLDL 63 (H) 02/07/2020 0658   LDLCALC 61 02/07/2020 0658     Physical Exam:    VS:  There were no vitals taken for this visit.    Wt Readings from Last 3 Encounters:  07/12/20 197 lb 3.2 oz (89.4 kg)  06/29/20 194 lb (88 kg)  06/20/20 175 lb  0.7 oz (79.4 kg)     GEN:  Well nourished, well developed in no acute distress HEENT: Normal NECK: No JVD; No carotid bruits LYMPHATICS: No lymphadenopathy CARDIAC:RRR, no murmurs, rubs, gallops RESPIRATORY:  Minimal crackles ABDOMEN: Soft, non-tender, non-distended MUSCULOSKELETAL:  + edema, L>R; No deformity  SKIN: Warm and dry NEUROLOGIC:  Alert and oriented x 3 PSYCHIATRIC:  Normal affect   ASSESSMENT:    1. Coronary artery disease involving native coronary artery of native heart with unstable angina pectoris ([place])   2. Chronic systolic heart failure ([place])   3. Essential hypertension   4. OSA (obstructive sleep apnea)    PLAN:    In order of problems listed above:  Chest tightness and Shortness of breath CAD status post CABG with PCI to SVG-RCA 06/20/2020 Patient reports persistent generalized fatigue,  chest tightness DOE, orthopnea and LLE. Weight is up. Says chest pain is not similar to MI pain. Has been doing cardiac rehab and does okay, just gets very tired. Given fatigue will check TSH and CBC. EKG with no new ischemic changes. Suspect symptoms multifactorial given recent MI, deconditioning, and volume overload. If chest tightness does not improve with diuretic can consider titration of Imdur. Also cath said can consider consider PCI to SVG-OM2. Continue Aspirin, Brilinta, Statin, and BB. Will reassess symtpoms at follow-up.  HFrEF/ICM Reports DOE, lower leg edema, orthopnea since discharge. He was on  Lasix 40mg  PRN prior to admission, however this was stopped. On exam has lower leg edema, and minimal crackles at bases, no JVD appreciated. Weight is up 8lbs since the last visit. RedsVest 35%. I will start lasix 20mg  daily with potassium since he has h/o hypokalemia. I will get BMET, BNP. We will see him back in a week to evaluate volume status. He had allergic rxn to [facility]. Continue Losartan, Coreg, spiro. At follow-up can discuss repeat echo.  Hypertension BP wnl today.  Continue current regimen. Lasix as above  Hyperlipidemia LDL 61 in 01/2020. Continue Crestor  OSA Continue CPAP  Disposition: Follow up  in 1 week(s) with APP   Signed, Trenell Moxey Ninfa Meeker, PA-C  08/07/2020 8:14 AM    [facility]

## 2020-08-07 NOTE — Patient Instructions (Addendum)
Medication Instructions:  Your physician has recommended you make the following change in your medication:  1.  START Lasix 20 mg taking 1 tablet daily 2.  START Potassium 10 meq taking 1 tablet daily   *If you need a refill on your cardiac medications before your next appointment, please call your pharmacy*   Lab Work: TODAY:  BMET, PRO BNP, TSH, & CBC  If you have labs (blood work) drawn today and your tests are completely normal, you will receive your results only by: [name] MyChart Message (if you have MyChart) OR . A paper copy in the mail If you have any lab test that is abnormal or we need to change your treatment, we will call you to review the results.   Testing/Procedures: None ordered   Follow-Up: At [facility], you and your health needs are our priority.  As part of our continuing mission to provide you with exceptional heart care, we have created designated Provider Care Teams.  These Care Teams include your primary Cardiologist (physician) and Advanced Practice Providers (APPs -  Physician Assistants and Nurse Practitioners) who all work together to provide you with the care you need, when you need it.  We recommend signing up for the patient portal called "MyChart".  Sign up information is provided on this After Visit Summary.  MyChart is used to connect with patients for Virtual Visits (Telemedicine).  Patients are able to view lab/test results, encounter notes, upcoming appointments, etc.  Non-urgent messages can be sent to your provider as well.   To learn more about what you can do with MyChart, go to [email or web address].    Your next appointment:   1 Week  The format for your next appointment:   In Person  Provider:   You may see [name], MD or one of the following Advanced Practice Providers on your designated Care Team:    Murray Hodgkins, NP  Christell Faith, PA-C  Marrianne Mood, PA-C  Cadence [place], [place]  Laurann [place], NP    Other  Instructions

## 2020-08-08 LAB — BASIC METABOLIC PANEL
BUN/Creatinine Ratio: 11 (ref 10–24)
BUN: 11 mg/dL (ref 8–27)
CO2: 24 mmol/L (ref 20–29)
Calcium: 9.5 mg/dL (ref 8.6–10.2)
Chloride: 103 mmol/L (ref 96–106)
Creatinine, Ser: 0.97 mg/dL (ref 0.76–1.27)
Glucose: 129 mg/dL — ABNORMAL HIGH (ref 65–99)
Potassium: 4.2 mmol/L (ref 3.5–5.2)
Sodium: 142 mmol/L (ref 134–144)
eGFR: 85 mL/min/{1.73_m2} (ref >59)

## 2020-08-08 LAB — TSH: TSH: 1.96 u[IU]/mL (ref 0.450–4.500)

## 2020-08-08 LAB — CBC
Hematocrit: 40.3 % (ref 37.5–51.0)
Hemoglobin: 13.7 g/dL (ref 13.0–17.7)
MCH: 32.6 pg (ref 26.6–33.0)
MCHC: 34 g/dL (ref 31.5–35.7)
MCV: 96 fL (ref 79–97)
Platelets: 182 10*3/uL (ref 150–450)
RBC: 4.2 x10E6/uL (ref 4.14–5.80)
RDW: 13.2 % (ref 11.6–15.4)
WBC: 6.5 10*3/uL (ref 3.4–10.8)

## 2020-08-08 LAB — PRO B NATRIURETIC PEPTIDE: NT-Pro BNP: 242 pg/mL (ref 0–376)

## 2020-08-09 ENCOUNTER — Encounter: Payer: Self-pay | Admitting: *Deleted

## 2020-08-09 ENCOUNTER — Other Ambulatory Visit: Payer: Self-pay

## 2020-08-09 ENCOUNTER — Encounter: Payer: Medicare HMO | Admitting: *Deleted

## 2020-08-09 DIAGNOSIS — I214 Non-ST elevation (NSTEMI) myocardial infarction: Secondary | ICD-10-CM | POA: Diagnosis not present

## 2020-08-09 DIAGNOSIS — Z955 Presence of coronary angioplasty implant and graft: Secondary | ICD-10-CM

## 2020-08-09 NOTE — Progress Notes (Signed)
Cardiac Individual Treatment Plan  Patient Details  Name: Willie Clark MRN: [id number] Date of Birth: 09-28-1950 Referring Provider:   Flowsheet Row Cardiac Rehab from 07/12/2020 in [facility] Cardiac and Pulmonary Rehab  Referring Provider End, Harrell Gave MD  [facility] Cardiologist: Dr. Rogue Jury Arida]      Initial Encounter Date:  Flowsheet Row Cardiac Rehab from 07/12/2020 in [facility] Cardiac and Pulmonary Rehab  Date 07/12/20      Visit Diagnosis: Status post coronary artery stent placement  NSTEMI (non-ST elevated myocardial infarction) [facility])  Patient's Home Medications on Admission:  Current Outpatient Medications:  .  albuterol (PROVENTIL HFA;VENTOLIN HFA) 108 (90 Base) MCG/ACT inhaler, Inhale into the lungs every 4 (four) hours as needed for wheezing or shortness of breath., Disp: , Rfl:  .  aspirin EC 81 MG EC tablet, Take 1 tablet (81 mg total) by mouth daily. Swallow whole., Disp: 30 tablet, Rfl: 11 .  carvedilol (COREG) 3.125 MG tablet, Take 1 tablet (3.125 mg total) by mouth 2 (two) times daily with a meal., Disp: 180 tablet, Rfl: 0 .  citalopram (CELEXA) 20 MG tablet, Take 20 mg by mouth daily. , Disp: , Rfl:  .  EPINEPHrine 0.3 mg/0.3 mL IJ SOAJ injection, Inject 0.3 mg into the muscle as needed. , Disp: , Rfl:  .  Erenumab-aooe 140 MG/ML SOAJ, Inject 140 mg into the skin every 28 (twenty-eight) days., Disp: , Rfl:  .  finasteride (PROSCAR) 5 MG tablet, Take 5 mg by mouth daily., Disp: , Rfl:  .  furosemide (LASIX) 20 MG tablet, Take 1 tablet (20 mg total) by mouth daily., Disp: 90 tablet, Rfl: 3 .  gabapentin (NEURONTIN) 800 MG tablet, Take 1 tablet by mouth 3 (three) times daily., Disp: , Rfl:  .  isosorbide mononitrate (IMDUR) 30 MG 24 hr tablet, Take 0.5 tablets (15 mg total) by mouth daily., Disp: 15 tablet, Rfl: 3 .  losartan (COZAAR) 25 MG tablet, TAKE 1 TABLET EVERY DAY, Disp: 90 tablet, Rfl: 0 .  nitroGLYCERIN (NITROSTAT) 0.4 MG SL tablet, Place 1 tablet (0.4 mg total)  under the tongue every 5 (five) minutes as needed for chest pain., Disp: 90 tablet, Rfl: 1 .  ondansetron (ZOFRAN) 4 MG tablet, Take 4 mg by mouth every 8 (eight) hours as needed for nausea or vomiting., Disp: , Rfl:  .  pantoprazole (PROTONIX) 40 MG tablet, Take 40 mg by mouth 2 (two) times daily. , Disp: , Rfl:  .  Peppermint Oil (IBGARD PO), Take by mouth daily at 2 am., Disp: , Rfl:  .  potassium chloride (KLOR-CON) 10 MEQ tablet, Take 1 tablet (10 mEq total) by mouth daily., Disp: 90 tablet, Rfl: 3 .  Probiotic Product (PROBIOTIC PO), Take by mouth daily at 8 pm., Disp: , Rfl:  .  rOPINIRole (REQUIP) 1 MG tablet, Take 1 mg by mouth 2 (two) times daily., Disp: , Rfl:  .  rosuvastatin (CRESTOR) 40 MG tablet, Take 1 tablet (40 mg total) by mouth daily., Disp: 90 tablet, Rfl: 1 .  spironolactone (ALDACTONE) 25 MG tablet, Take 0.5 tablets (12.5 mg total) by mouth daily., Disp: 45 tablet, Rfl: 1 .  ticagrelor (BRILINTA) 90 MG TABS tablet, Take 90 mg by mouth 2 (two) times daily., Disp: , Rfl:  .  VASCEPA 1 g capsule, TAKE 2 CAPSULES TWICE DAILY, Disp: 360 capsule, Rfl: 1 .  vitamin B-12 (CYANOCOBALAMIN) 1000 MCG tablet, Take 1,000 mcg by mouth daily., Disp: , Rfl:   Past Medical History: Past Medical  History:  Diagnosis Date  . Anxiety   . Arthritis   . Barrett's esophagus   . Bowel obstruction ([place]) 02/2009   small bowel  . Chronic chest pain   . Coronary artery disease    a. 1993 CABG; b. 2007 Redo CABG; c. 09/2015 Cath: LM mild dzs, LAD 100ost, RI mild dzs, LCX 60p, OM1 100, RCA 100ost, VG->OM2 min irregs, LIMA->LAD nl, VG->RPDA 30ost, 20d, nl EF->Med rx.  . Depression   . Diverticulitis   . Diverticulosis   . Erectile dysfunction   . Fibromyalgia   . Gastric ulcer   . Gastritis   . GERD (gastroesophageal reflux disease)   . Headache   . Hemorrhoids   . HFrEF (heart failure with reduced ejection fraction) ([place])    a. 09/2016 Echo: EF 55-60%, no rwma, triv AI, mild MR, mildly dil  LA. -> as of April 2020: EF 40 to 45%.  Septal dyssynergy/hypokinesis due to postop state but otherwise unable to assess wall motion [name] due to poor study.  . History of myocardial infarct at age less than 33 years   . Hyperlipidemia   . Hypertension   . Kidney stones    going to [facility]  June 26 to see kidney specialist  . Lightheadedness   . Mass on back   . Nephrolithiasis   . Orthostatic hypotension    a. Improved after discontinuation of metoprolol.  . OSA (obstructive sleep apnea)    a. On CPAP.  [name] Pulmonary embolism ([place]) 01/2006   Post-op/treated  . Skin lesions, generalized   . Stroke [facility]) 1993   right side weakness no blood thinners   on Aspirin  . Syncope and collapse   . Urticaria     Tobacco Use: Social History   Tobacco Use  Smoking Status Former Smoker  . Packs/day: 2.50  . Types: Cigarettes  . Quit date: 08/21/1991  . Years since quitting: 28.9  Smokeless Tobacco Never Used    Labs: Recent Review Flowsheet Data    Labs for ITP Cardiac and Pulmonary Rehab Latest Ref Rng & Units 09/24/2015 08/01/2018 12/11/2019 12/13/2019 02/07/2020   Cholestrol 0 - 200 mg/dL - 154 - 181 165   LDLCALC 0 - 99 mg/dL - 56 - 84 61   HDL >40 mg/dL - 41 - 35(L) 41   Trlycerides <150 mg/dL - 285(H) - 312(H) 317(H)   Hemoglobin A1c 4.8 - 5.6 % 5.6 - 6.0(H) - -       Exercise Target Goals: Exercise Program Goal: Individual exercise prescription set using results from initial 6 min walk test and THRR while considering  patient's activity barriers and safety.   Exercise Prescription Goal: Initial exercise prescription builds to 30-45 minutes a day of aerobic activity, 2-3 days per week.  Home exercise guidelines will be given to patient during program as part of exercise prescription that the participant will acknowledge.   Education: Aerobic Exercise: - Group verbal and visual presentation on the components of exercise prescription. Introduces F.I.T.T principle from ACSM  for exercise prescriptions.  Reviews F.I.T.T. principles of aerobic exercise including progression. Written material given at graduation. Flowsheet Row Cardiac Rehab from 08/02/2020 in [facility] Cardiac and Pulmonary Rehab  Education need identified 07/12/20      Education: Resistance Exercise: - Group verbal and visual presentation on the components of exercise prescription. Introduces F.I.T.T principle from ACSM for exercise prescriptions  Reviews F.I.T.T. principles of resistance exercise including progression. Written material given at graduation.  Education: Exercise & Equipment Safety: - Individual verbal instruction and demonstration of equipment use and safety with use of the equipment. Flowsheet Row Cardiac Rehab from 08/02/2020 in [facility] Cardiac and Pulmonary Rehab  Date 07/12/20  Educator [facility]  Instruction Review Code 1- Verbalizes Understanding      Education: Exercise Physiology & General Exercise Guidelines: - Group verbal and written instruction with models to review the exercise physiology of the cardiovascular system and associated critical values. Provides general exercise guidelines with specific guidelines to those with heart or lung disease.  Flowsheet Row Cardiac Rehab from 08/02/2020 in [facility] Cardiac and Pulmonary Rehab  Date 08/02/20  Educator [facility]  Instruction Review Code 1- Verbalizes Understanding      Education: Flexibility, Balance, Mind/Body Relaxation: - Group verbal and visual presentation with interactive activity on the components of exercise prescription. Introduces F.I.T.T principle from ACSM for exercise prescriptions. Reviews F.I.T.T. principles of flexibility and balance exercise training including progression. Also discusses the mind body connection.  Reviews various relaxation techniques to help reduce and manage stress (i.e. Deep breathing, progressive muscle relaxation, and visualization). Balance handout provided to take home. Written material given at  graduation.   Activity Barriers & Risk Stratification:  Activity Barriers & Cardiac Risk Stratification - 07/12/20 1027      Activity Barriers & Cardiac Risk Stratification   Activity Barriers Arthritis;Back Problems;Neck/Spine Problems;Assistive Device;Deconditioning;Muscular Weakness;Shortness of Breath;Balance Concerns    Cardiac Risk Stratification High           6 Minute Walk:  6 Minute Walk    Row Name 07/12/20 1026         6 Minute Walk   Phase Initial     Distance 1080 feet     Walk Time 6 minutes     # of Rest Breaks 0     MPH 2.04     METS 2.5     RPE 13     Perceived Dyspnea  2     VO2 Peak 8.75     Symptoms Yes (comment)     Comments R leg weakness from CVA, chronic pain 6/10     Resting HR 62 bpm     Resting BP 150/72     Resting Oxygen Saturation  95 %     Exercise Oxygen Saturation  during 6 min walk 98 %     Max Ex. HR 81 bpm     Max Ex. BP 174/72     2 Minute Post BP 134/68            Oxygen Initial Assessment:   Oxygen Re-Evaluation:   Oxygen Discharge (Final Oxygen Re-Evaluation):   Initial Exercise Prescription:  Initial Exercise Prescription - 07/12/20 1000      Date of Initial Exercise RX and Referring Provider   Date 07/12/20    Referring Provider End, Harrell Gave MD   Primary Cardiologist: Dr. [name]     Treadmill   MPH 2    Grade 0    Minutes 15    METs 2.53      REL-XR   Level 1    Speed 50    Minutes 15    METs 2      T5 Nustep   Level 1    SPM 80    Minutes 15    METs 2      Prescription Details   Frequency (times per week) 3    Duration Progress to 30 minutes of continuous aerobic without signs/symptoms of  physical distress      Intensity   THRR 40-80% of Max Heartrate 98-133    Ratings of Perceived Exertion 11-13    Perceived Dyspnea 0-4      Progression   Progression Continue to progress workloads to maintain intensity without signs/symptoms of physical distress.      Resistance Training    Training Prescription Yes    Weight 4 lb    Reps 10-15           Perform Capillary Blood Glucose checks as needed.  Exercise Prescription Changes:  Exercise Prescription Changes    Row Name 07/12/20 1000 07/18/20 1200 07/31/20 1300         Response to Exercise   Blood Pressure (Admit) 150/72 102/60 128/62     Blood Pressure (Exercise) 174/72 138/80 126/60     Blood Pressure (Exit) 134/68 118/60 124/64     Heart Rate (Admit) 62 bpm 68 bpm 64 bpm     Heart Rate (Exercise) 81 bpm 108 bpm 104 bpm     Heart Rate (Exit) 56 bpm 73 bpm 70 bpm     Oxygen Saturation (Admit) 95 % -- --     Oxygen Saturation (Exercise) 98 % -- --     Rating of Perceived Exertion (Exercise) 13 15 15      Perceived Dyspnea (Exercise) 2 -- --     Symptoms R leg weakness, chronic pain 6/10 -- none     Comments walk test results 2nd day --     Duration -- Continue with 30 min of aerobic exercise without signs/symptoms of physical distress. Continue with 30 min of aerobic exercise without signs/symptoms of physical distress.     Intensity -- THRR unchanged THRR unchanged           Progression   Progression -- Continue to progress workloads to maintain intensity without signs/symptoms of physical distress. Continue to progress workloads to maintain intensity without signs/symptoms of physical distress.     Average METs -- 2.4 3.77           Resistance Training   Training Prescription -- Yes Yes     Weight -- 4 lb 4 lb     Reps -- 10-15 10-15           Interval Training   Interval Training -- -- No           Treadmill   MPH -- 2.1 2.4     Grade -- 0 0     Minutes -- 15 15     METs -- 2.61 2.84           REL-XR   Level -- -- 6     Minutes -- -- 15     METs -- -- 4.7           T5 Nustep   Level -- 4 4     SPM -- 80 --     Minutes -- 15 15     METs -- 2.2 --            Exercise Comments:  Exercise Comments    Row Name 07/14/20 0955           Exercise Comments First full day of  exercise!  Patient was oriented to gym and equipment including functions, settings, policies, and procedures.  Patient's individual exercise prescription and treatment plan were reviewed.  All starting workloads were established based on the results of the 6 minute walk test done at  initial orientation visit.  The plan for exercise progression was also introduced and progression will be customized based on patient's performance and goals.              Exercise Goals and Review:  Exercise Goals    Row Name 07/12/20 1029             Exercise Goals   Increase Physical Activity Yes       Intervention Provide advice, education, support and counseling about physical activity/exercise needs.;Develop an individualized exercise prescription for aerobic and resistive training based on initial evaluation findings, risk stratification, comorbidities and participant's personal goals.       Expected Outcomes Short Term: Attend rehab on a regular basis to increase amount of physical activity.;Long Term: Add in home exercise to make exercise part of routine and to increase amount of physical activity.;Long Term: Exercising regularly at least 3-5 days a week.       Increase Strength and Stamina Yes       Intervention Provide advice, education, support and counseling about physical activity/exercise needs.;Develop an individualized exercise prescription for aerobic and resistive training based on initial evaluation findings, risk stratification, comorbidities and participant's personal goals.       Expected Outcomes Short Term: Increase workloads from initial exercise prescription for resistance, speed, and METs.;Short Term: Perform resistance training exercises routinely during rehab and add in resistance training at home;Long Term: Improve cardiorespiratory fitness, muscular endurance and strength as measured by increased METs and functional capacity (6MWT)       Able to understand and use rate of perceived  exertion (RPE) scale Yes       Intervention Provide education and explanation on how to use RPE scale       Expected Outcomes Short Term: Able to use RPE daily in rehab to express subjective intensity level;Long Term:  Able to use RPE to guide intensity level when exercising independently       Able to understand and use Dyspnea scale Yes       Intervention Provide education and explanation on how to use Dyspnea scale       Expected Outcomes Short Term: Able to use Dyspnea scale daily in rehab to express subjective sense of shortness of breath during exertion;Long Term: Able to use Dyspnea scale to guide intensity level when exercising independently       Knowledge and understanding of Target Heart Rate Range (THRR) Yes       Intervention Provide education and explanation of THRR including how the numbers were predicted and where they are located for reference       Expected Outcomes Short Term: Able to state/look up THRR;Long Term: Able to use THRR to govern intensity when exercising independently;Short Term: Able to use daily as guideline for intensity in rehab       Able to check pulse independently Yes       Intervention Provide education and demonstration on how to check pulse in carotid and radial arteries.;Review the importance of being able to check your own pulse for safety during independent exercise       Expected Outcomes Short Term: Able to explain why pulse checking is important during independent exercise;Long Term: Able to check pulse independently and accurately       Understanding of Exercise Prescription Yes       Intervention Provide education, explanation, and written materials on patient's individual exercise prescription       Expected Outcomes Short Term: Able to explain  program exercise prescription;Long Term: Able to explain home exercise prescription to exercise independently              Exercise Goals Re-Evaluation :  Exercise Goals Re-Evaluation    Row Name  07/14/20 0955 07/18/20 1252 07/28/20 1002 07/31/20 1350       Exercise Goal Re-Evaluation   Exercise Goals Review Able to understand and use rate of perceived exertion (RPE) scale;Able to understand and use Dyspnea scale;Understanding of Exercise Prescription;Knowledge and understanding of Target Heart Rate Range (THRR) Increase Physical Activity;Increase Strength and Stamina Increase Physical Activity;Increase Strength and Stamina;Understanding of Exercise Prescription Increase Physical Activity;Increase Strength and Stamina;Understanding of Exercise Prescription    Comments Reviewed RPE and dyspnea scales, THR and program prescription with pt today.  Pt voiced understanding and was given a copy of goals to take home. Willie Clark is teolerating exercise well and has increased to level 4 on T5 NS.  Staff will monitor progress. Patient is using the treadmill at home twice a week. He is going to start walking the dog for exercise too. patient is going to have home exercise reviewed soon. Willie Clark is doing well in rehab.  He is now on level 6 for the XR.  We will continue to monitor his progress.    Expected Outcomes Short: Use RPE daily to regulate intensity. Long: Follow program prescription in THR. Short:  attend consistently Long:  improve overall MET level Short: get home exercise review. Long: maintain exercise independently at home. Short: Review home exercise guidelines Long: Continue to improve stamina           Discharge Exercise Prescription (Final Exercise Prescription Changes):  Exercise Prescription Changes - 07/31/20 1300      Response to Exercise   Blood Pressure (Admit) 128/62    Blood Pressure (Exercise) 126/60    Blood Pressure (Exit) 124/64    Heart Rate (Admit) 64 bpm    Heart Rate (Exercise) 104 bpm    Heart Rate (Exit) 70 bpm    Rating of Perceived Exertion (Exercise) 15    Symptoms none    Duration Continue with 30 min of aerobic exercise without signs/symptoms of physical  distress.    Intensity THRR unchanged      Progression   Progression Continue to progress workloads to maintain intensity without signs/symptoms of physical distress.    Average METs 3.77      Resistance Training   Training Prescription Yes    Weight 4 lb    Reps 10-15      Interval Training   Interval Training No      Treadmill   MPH 2.4    Grade 0    Minutes 15    METs 2.84      REL-XR   Level 6    Minutes 15    METs 4.7      T5 Nustep   Level 4    Minutes 15           Nutrition:  Target Goals: Understanding of nutrition guidelines, daily intake of sodium <153m, cholesterol <2046m calories 30% from fat and 7% or less from saturated fats, daily to have 5 or more servings of fruits and vegetables.  Education: All About Nutrition: -Group instruction provided by verbal, written material, interactive activities, discussions, models, and posters to present general guidelines for heart healthy nutrition including fat, fiber, MyPlate, the role of sodium in heart healthy nutrition, utilization of the nutrition label, and utilization of this knowledge for meal planning.  Follow up email sent as well. Written material given at graduation. Flowsheet Row Cardiac Rehab from 08/02/2020 in [facility] Cardiac and Pulmonary Rehab  Education need identified 07/12/20      Biometrics:  Pre Biometrics - 07/12/20 1030      Pre Biometrics   Height 5' 9.8" (1.773 m)    Weight 197 lb 3.2 oz (89.4 kg)    BMI (Calculated) 28.45    Single Leg Stand 4.7 seconds            Nutrition Therapy Plan and Nutrition Goals:  Nutrition Therapy & Goals - 07/24/20 0853      Nutrition Therapy   Fiber 30 grams    Whole Grain Foods 3 servings    Saturated Fats 12 max. grams    Fruits and Vegetables 8 servings/day    Sodium 1.5 grams      Personal Nutrition Goals   Nutrition Goal ST: LT:    Comments B: cheese and egg sandwich or bowl of grits L: salads with grilled chicken for lunch (romaine  lettuce) - take out with baked potato D: orange or apple with peanut butter. He reports he fluctuates 2-3 lbs 191.4 lbs (at home). Drinks: water, coffee in am and pm with cream and sugar Discussed heart healthy eating. He will have beans 1-2x/week. He can't eat onions due to sensistivity.      Intervention Plan   Intervention Prescribe, educate and counsel regarding individualized specific dietary modifications aiming towards targeted core components such as weight, hypertension, lipid management, diabetes, heart failure and other comorbidities.;Nutrition handout(s) given to patient.    Expected Outcomes Short Term Goal: Understand basic principles of dietary content, such as calories, fat, sodium, cholesterol and nutrients.;Short Term Goal: A plan has been developed with personal nutrition goals set during dietitian appointment.;Long Term Goal: Adherence to prescribed nutrition plan.           Nutrition Assessments:  MEDIFICTS Score Key:  ?70 Need to make dietary changes   40-70 Heart Healthy Diet  ? 40 Therapeutic Level Cholesterol Diet  Flowsheet Row Cardiac Rehab from 07/12/2020 in [facility] Cardiac and Pulmonary Rehab  Picture Your Plate Total Score on Admission 70     Picture Your Plate Scores:  <88 Unhealthy dietary pattern with much room for improvement.  41-50 Dietary pattern unlikely to meet recommendations for good health and room for improvement.  51-60 More healthful dietary pattern, with some room for improvement.   >60 Healthy dietary pattern, although there may be some specific behaviors that could be improved.    Nutrition Goals Re-Evaluation:   Nutrition Goals Discharge (Final Nutrition Goals Re-Evaluation):   Psychosocial: Target Goals: Acknowledge presence or absence of significant depression and/or stress, maximize coping skills, provide positive support system. Participant is able to verbalize types and ability to use techniques and skills needed for reducing  stress and depression.   Education: Stress, Anxiety, and Depression - Group verbal and visual presentation to define topics covered.  Reviews how body is impacted by stress, anxiety, and depression.  Also discusses healthy ways to reduce stress and to treat/manage anxiety and depression.  Written material given at graduation.   Education: Sleep Hygiene -Provides group verbal and written instruction about how sleep can affect your health.  Define sleep hygiene, discuss sleep cycles and impact of sleep habits. Review good sleep hygiene tips.    Initial Review & Psychosocial Screening:  Initial Psych Review & Screening - 07/10/20 1537      Initial Review  Current issues with Current Stress Concerns    Source of Stress Concerns Unable to participate in former interests or hobbies;Unable to perform yard/household activities      [place]? Yes   wife     Barriers   Psychosocial barriers to participate in program There are no identifiable barriers or psychosocial needs.      Screening Interventions   Interventions Encouraged to exercise;To provide support and resources with identified psychosocial needs;Provide feedback about the scores to participant    Expected Outcomes Short Term goal: Utilizing psychosocial counselor, staff and physician to assist with identification of specific Stressors or current issues interfering with healing process. Setting desired goal for each stressor or current issue identified.;Long Term Goal: Stressors or current issues are controlled or eliminated.;Short Term goal: Identification and review with participant of any Quality of Life or Depression concerns found by scoring the questionnaire.;Long Term goal: The participant improves quality of Life and PHQ9 Scores as seen by post scores and/or verbalization of changes           Quality of Life Scores:   Quality of Life - 07/12/20 1030      Quality of Life   Select Quality of Life       Quality of Life Scores   Health/Function Pre 19.47 %    Socioeconomic Pre 20.17 %    Psych/Spiritual Pre 23.93 %    Family Pre 22.33 %    GLOBAL Pre 20.89 %          Scores of 19 and below usually indicate a poorer quality of life in these areas.  A difference of  2-3 points is a clinically meaningful difference.  A difference of 2-3 points in the total score of the Quality of Life Index has been associated with significant improvement in overall quality of life, self-image, physical symptoms, and general health in studies assessing change in quality of life.  PHQ-9: Recent Review Flowsheet Data    Depression screen [facility] 2/9 07/12/2020   Decreased Interest 3   Down, Depressed, Hopeless 1   PHQ - 2 Score 4   Altered sleeping 1   Tired, decreased energy 2   Change in appetite 0   Feeling bad or failure about yourself  0   Trouble concentrating 0   Moving slowly or fidgety/restless 1   Suicidal thoughts 0   PHQ-9 Score 8   Difficult doing work/chores Somewhat difficult     Interpretation of Total Score  Total Score Depression Severity:  1-4 = Minimal depression, 5-9 = Mild depression, 10-14 = Moderate depression, 15-19 = Moderately severe depression, 20-27 = Severe depression   Psychosocial Evaluation and Intervention:  Psychosocial Evaluation - 07/10/20 1544      Psychosocial Evaluation & Interventions   Interventions Encouraged to exercise with the program and follow exercise prescription;Stress management education;Relaxation education    Comments Mr. 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 He has done the program before throughout the years so he is familiar with the concept. His breathing makes it hard to do things around the house. He also suffers from arthritis all over his body which he receives injections for, so he is limited on his physical activity. His doctor is aware of his breathing issues and thinks this  program will help him. He states besides these current stated issues he isn't experiencing any more stress than usual.    Expected Outcomes Short; attend  cardiac rehab for education and exercise. Long: develop positive self care behaviors.    Continue Psychosocial Services  Follow up required by staff           Psychosocial Re-Evaluation:  Psychosocial Re-Evaluation    [place] Name 07/28/20 1010             Psychosocial Re-Evaluation   Current issues with None Identified       Comments Patient reports no issues with their current mental states, sleep, stress, depression or anxiety. Will follow up with patient in a few weeks for any changes.       Expected Outcomes Short: Continue to exercise regularly to support mental health and notify staff of any changes. Long: maintain mental health and well being through teaching of rehab or prescribed medications independently.       Interventions Encouraged to attend Cardiac Rehabilitation for the exercise       Continue Psychosocial Services  Follow up required by staff              Psychosocial Discharge (Final Psychosocial Re-Evaluation):  Psychosocial Re-Evaluation - 07/28/20 1010      Psychosocial Re-Evaluation   Current issues with None Identified    Comments Patient reports no issues with their current mental states, sleep, stress, depression or anxiety. Will follow up with patient in a few weeks for any changes.    Expected Outcomes Short: Continue to exercise regularly to support mental health and notify staff of any changes. Long: maintain mental health and well being through teaching of rehab or prescribed medications independently.    Interventions Encouraged to attend Cardiac Rehabilitation for the exercise    Continue Psychosocial Services  Follow up required by staff           Vocational Rehabilitation: Provide vocational rehab assistance to qualifying candidates.   Vocational Rehab Evaluation & Intervention:  Vocational  Rehab - 07/10/20 1537      Initial Vocational Rehab Evaluation & Intervention   Assessment shows need for Vocational Rehabilitation No           Education: Education Goals: Education classes will be provided on a variety of topics geared toward better understanding of heart health and risk factor modification. Participant will state understanding/return demonstration of topics presented as noted by education test scores.  Learning Barriers/Preferences:  Learning Barriers/Preferences - 07/10/20 1536      Learning Barriers/Preferences   Learning Barriers None    Learning Preferences None           General Cardiac Education Topics:  AED/CPR: - Group verbal and written instruction with the use of models to demonstrate the basic use of the AED with the basic ABC's of resuscitation.   Anatomy and Cardiac Procedures: - Group verbal and visual presentation and models provide information about basic cardiac anatomy and function. Reviews the testing methods done to diagnose heart disease and the outcomes of the test results. Describes the treatment choices: Medical Management, Angioplasty, or Coronary Bypass Surgery for treating various heart conditions including Myocardial Infarction, Angina, Valve Disease, and Cardiac Arrhythmias.  Written material given at graduation. Flowsheet Row Cardiac Rehab from 08/02/2020 in [facility] Cardiac and Pulmonary Rehab  Education need identified 07/12/20      Medication Safety: - Group verbal and visual instruction to review commonly prescribed medications for heart and lung disease. Reviews the medication, class of the drug, and side effects. Includes the steps to properly store meds and maintain the prescription regimen.  Written material given at  graduation.   Intimacy: - Group verbal instruction through game format to discuss how heart and lung disease can affect sexual intimacy. Written material given at graduation..   Know Your Numbers and Heart  Failure: - Group verbal and visual instruction to discuss disease risk factors for cardiac and pulmonary disease and treatment options.  Reviews associated critical values for Overweight/Obesity, Hypertension, Cholesterol, and Diabetes.  Discusses basics of heart failure: signs/symptoms and treatments.  Introduces Heart Failure Zone chart for action plan for heart failure.  Written material given at graduation.   Infection Prevention: - Provides verbal and written material to individual with discussion of infection control including proper hand washing and proper equipment cleaning during exercise session. Flowsheet Row Cardiac Rehab from 08/02/2020 in [facility] Cardiac and Pulmonary Rehab  Date 07/12/20  Educator [facility]  Instruction Review Code 1- Verbalizes Understanding      Falls Prevention: - Provides verbal and written material to individual with discussion of falls prevention and safety. Flowsheet Row Cardiac Rehab from 08/02/2020 in [facility] Cardiac and Pulmonary Rehab  Date 07/12/20  Educator [facility]  Instruction Review Code 1- Verbalizes Understanding      Other: -Provides group and verbal instruction on various topics (see comments)   Knowledge Questionnaire Score:  Knowledge Questionnaire Score - 07/12/20 1031      Knowledge Questionnaire Score   Pre Score 21/26 Education Focus: Angina, sex, nutrition, exercise           Core Components/Risk Factors/Patient Goals at Admission:  Personal Goals and Risk Factors at Admission - 07/12/20 1032      Core Components/Risk Factors/Patient Goals on Admission    Weight Management Yes;Weight Loss    Intervention Weight Management: Develop a combined nutrition and exercise program designed to reach desired caloric intake, while maintaining appropriate intake of nutrient and fiber, sodium and fats, and appropriate energy expenditure required for the weight goal.;Weight Management: Provide education and appropriate resources to help participant work  on and attain dietary goals.;Weight Management/Obesity: Establish reasonable short term and long term weight goals.    Admit Weight 197 lb 3.2 oz (89.4 kg)    Goal Weight: Short Term 192 lb (87.1 kg)    Goal Weight: Long Term 185 lb (83.9 kg)    Expected Outcomes Short Term: Continue to assess and modify interventions until short term weight is achieved;Long Term: Adherence to nutrition and physical activity/exercise program aimed toward attainment of established weight goal;Weight Loss: Understanding of general recommendations for a balanced deficit meal plan, which promotes 1-2 lb weight loss per week and includes a negative energy balance of [phone] kcal/d;Understanding recommendations for meals to include 15-35% energy as protein, 25-35% energy from fat, 35-60% energy from carbohydrates, less than 210m of dietary cholesterol, 20-35 gm of total fiber daily;Understanding of distribution of calorie intake throughout the day with the consumption of 4-5 meals/snacks    Heart Failure Yes    Intervention Provide a combined exercise and nutrition program that is supplemented with education, support and counseling about heart failure. Directed toward relieving symptoms such as shortness of breath, decreased exercise tolerance, and extremity edema.    Expected Outcomes Improve functional capacity of life;Short term: Attendance in program 2-3 days a week with increased exercise capacity. Reported lower sodium intake. Reported increased fruit and vegetable intake. Reports medication compliance.;Short term: Daily weights obtained and reported for increase. Utilizing diuretic protocols set by physician.;Long term: Adoption of self-care skills and reduction of barriers for early signs and symptoms recognition and intervention leading to self-care  maintenance.    Hypertension Yes    Intervention Provide education on lifestyle modifcations including regular physical activity/exercise, weight management, moderate  sodium restriction and increased consumption of fresh fruit, vegetables, and low fat dairy, alcohol moderation, and smoking cessation.;Monitor prescription use compliance.    Expected Outcomes Short Term: Continued assessment and intervention until BP is < 140/65m HG in hypertensive participants. < 130/853mHG in hypertensive participants with diabetes, heart failure or chronic kidney disease.;Long Term: Maintenance of blood pressure at goal levels.    Lipids Yes    Intervention Provide education and support for participant on nutrition & aerobic/resistive exercise along with prescribed medications to achieve LDL <709mHDL >66m4m  Expected Outcomes Short Term: Participant states understanding of desired cholesterol values and is compliant with medications prescribed. Participant is following exercise prescription and nutrition guidelines.;Long Term: Cholesterol controlled with medications as prescribed, with individualized exercise RX and with personalized nutrition plan. Value goals: LDL < 70mg45mL > 40 mg.           Education:Diabetes - Individual verbal and written instruction to review signs/symptoms of diabetes, desired ranges of glucose level fasting, after meals and with exercise. Acknowledge that pre and post exercise glucose checks will be done for 3 sessions at entry of program.   Core Components/Risk Factors/Patient Goals Review:   Goals and Risk Factor Review    Row Name 07/28/20 1005             Core Components/Risk Factors/Patient Goals Review   Personal Goals Review Weight Management/Obesity;Hypertension       Review DavidShanon [place] on losing weight. He would like to reach a weight goal of 175lbs. He has been taking his blood pressure at home every morning. He has been 120's over 60's on a routine basis. Sometimes his blood pressure gets low and he can feel it.       Expected Outcomes Short: monitor blood pressure. Long: inform doctor of lower readings.               Core Components/Risk Factors/Patient Goals at Discharge (Final Review):   Goals and Risk Factor Review - 07/28/20 1005      Core Components/Risk Factors/Patient Goals Review   Personal Goals Review Weight Management/Obesity;Hypertension    Review DavidShanon [place] on losing weight. He would like to reach a weight goal of 175lbs. He has been taking his blood pressure at home every morning. He has been 120's over 60's on a routine basis. Sometimes his blood pressure gets low and he can feel it.    Expected Outcomes Short: monitor blood pressure. Long: inform doctor of lower readings.           ITP Comments:  ITP Comments    Row Name 07/10/20 1550 07/12/20 1026 07/14/20 0955 08/09/20 0635     ITP Comments Initial telephone orientation completed. Diagnosis can be found in [facility] 3/1. EP orientation scheduled for Wed 3/23 at 9:30 Completed 6MWT and gym orientation. Initial ITP created and sent for review to Dr. Mark Emily Filbertical Director. First full day of exercise!  Patient was oriented to gym and equipment including functions, settings, policies, and procedures.  Patient's individual exercise prescription and treatment plan were reviewed.  All starting workloads were established based on the results of the 6 minute walk test done at initial orientation visit.  The plan for exercise progression was also introduced and progression will be customized based on patient's performance and goals. 30 Day review completed. Medical  Director ITP review done, changes made as directed, and signed approval by Market researcher.           Comments:

## 2020-08-09 NOTE — Progress Notes (Signed)
Daily Session Note  Patient Details  Name: Willie Clark MRN: [id number] Date of Birth: [birth year] Referring Provider:   Flowsheet Row Cardiac Rehab from 07/12/2020 in [facility] Cardiac and Pulmonary Rehab  Referring Provider End, Harrell Gave MD  [facility] Cardiologist: Dr. Rogue Jury Arida]      Encounter Date: 08/09/2020  Check In:  Session Check In - 08/09/20 0949      Check-In   Supervising physician immediately available to respond to emergencies See telemetry face sheet for immediately available ER MD    Location [facility]-Cardiac & Pulmonary Rehab    Staff Present Renita Papa, RN BSN;Joseph Hood RCP,RRT,BSRT;Melissa South Komelik RDN, LDN    Virtual Visit No    Medication changes reported     Yes    Comments added Potassium, Pregabalin, furosemide, and ondansetron    Fall or balance concerns reported    No    Warm-up and Cool-down Performed on first and last piece of equipment    Resistance Training Performed Yes    VAD Patient? No    PAD/SET Patient? No      Pain Assessment   Currently in Pain? No/denies              Social History   Tobacco Use  Smoking Status Former Smoker  . Packs/day: 2.50  . Types: Cigarettes  . Quit date: 08/21/1991  . Years since quitting: 28.9  Smokeless Tobacco Never Used    Goals Met:  Independence with exercise equipment Exercise tolerated well No report of cardiac concerns or symptoms Strength training completed today  Goals Unmet:  Not Applicable  Comments: Pt able to follow exercise prescription today without complaint.  Will continue to monitor for progression.    Dr. Emily Filbert is Medical Director for [place] and [facility].

## 2020-08-11 ENCOUNTER — Encounter: Payer: Medicare HMO | Admitting: *Deleted

## 2020-08-11 ENCOUNTER — Emergency Department
Admission: EM | Admit: 2020-08-11 | Discharge: 2020-08-11 | Disposition: A | Discharge status: Home / Self Care | Payer: Medicare HMO | Attending: Emergency Medicine | Admitting: Emergency Medicine

## 2020-08-11 ENCOUNTER — Emergency Department: Payer: Medicare HMO

## 2020-08-11 ENCOUNTER — Other Ambulatory Visit: Payer: Self-pay

## 2020-08-11 DIAGNOSIS — R1030 Lower abdominal pain, unspecified: Secondary | ICD-10-CM | POA: Insufficient documentation

## 2020-08-11 DIAGNOSIS — Z87891 Personal history of nicotine dependence: Secondary | ICD-10-CM | POA: Insufficient documentation

## 2020-08-11 DIAGNOSIS — I5042 Chronic combined systolic (congestive) and diastolic (congestive) heart failure: Secondary | ICD-10-CM | POA: Diagnosis not present

## 2020-08-11 DIAGNOSIS — K5732 Diverticulitis of large intestine without perforation or abscess without bleeding: Secondary | ICD-10-CM

## 2020-08-11 DIAGNOSIS — Z951 Presence of aortocoronary bypass graft: Secondary | ICD-10-CM | POA: Insufficient documentation

## 2020-08-11 DIAGNOSIS — R079 Chest pain, unspecified: Secondary | ICD-10-CM | POA: Diagnosis not present

## 2020-08-11 DIAGNOSIS — I25119 Atherosclerotic heart disease of native coronary artery with unspecified angina pectoris: Secondary | ICD-10-CM | POA: Insufficient documentation

## 2020-08-11 DIAGNOSIS — Z7982 Long term (current) use of aspirin: Secondary | ICD-10-CM | POA: Insufficient documentation

## 2020-08-11 DIAGNOSIS — Z955 Presence of coronary angioplasty implant and graft: Secondary | ICD-10-CM

## 2020-08-11 DIAGNOSIS — I110 Hypertensive heart disease with heart failure: Secondary | ICD-10-CM | POA: Diagnosis not present

## 2020-08-11 DIAGNOSIS — R0602 Shortness of breath: Secondary | ICD-10-CM | POA: Insufficient documentation

## 2020-08-11 DIAGNOSIS — I214 Non-ST elevation (NSTEMI) myocardial infarction: Secondary | ICD-10-CM

## 2020-08-11 LAB — BASIC METABOLIC PANEL
Anion gap: 9 (ref 5–15)
BUN: 11 mg/dL (ref 8–23)
CO2: 28 mmol/L (ref 22–32)
Calcium: 9.2 mg/dL (ref 8.9–10.3)
Chloride: 101 mmol/L (ref 98–111)
Creatinine, Ser: 1.09 mg/dL (ref 0.61–1.24)
GFR, Estimated: >60 mL/min (ref >60)
Glucose, Bld: 108 mg/dL — ABNORMAL HIGH (ref 70–99)
Potassium: 3.7 mmol/L (ref 3.5–5.1)
Sodium: 138 mmol/L (ref 135–145)

## 2020-08-11 LAB — CBC
HCT: 41.8 % (ref 39.0–52.0)
Hemoglobin: 14 g/dL (ref 13.0–17.0)
MCH: 32.7 pg (ref 26.0–34.0)
MCHC: 33.5 g/dL (ref 30.0–36.0)
MCV: 97.7 fL (ref 80.0–100.0)
Platelets: 175 10*3/uL (ref 150–400)
RBC: 4.28 MIL/uL (ref 4.22–5.81)
RDW: 13.2 % (ref 11.5–15.5)
WBC: 11.4 10*3/uL — ABNORMAL HIGH (ref 4.0–10.5)
nRBC: 0 % (ref 0.0–0.2)

## 2020-08-11 LAB — URINALYSIS, COMPLETE (UACMP) WITH MICROSCOPIC
Bacteria, UA: NONE SEEN
Bilirubin Urine: NEGATIVE
Glucose, UA: NEGATIVE mg/dL
Hgb urine dipstick: NEGATIVE
Ketones, ur: 5 mg/dL — AB
Leukocytes,Ua: NEGATIVE
Nitrite: NEGATIVE
Protein, ur: NEGATIVE mg/dL
Specific Gravity, Urine: 1.025 (ref 1.005–1.030)
pH: 5 (ref 5.0–8.0)

## 2020-08-11 LAB — TROPONIN I (HIGH SENSITIVITY)
Troponin I (High Sensitivity): 7 ng/L (ref <18)
Troponin I (High Sensitivity): 7 ng/L (ref <18)

## 2020-08-11 LAB — PROTIME-INR
INR: 0.9 (ref 0.8–1.2)
Prothrombin Time: 11.7 seconds (ref 11.4–15.2)

## 2020-08-11 MED ORDER — ASPIRIN 81 MG PO CHEW
324.0000 mg | CHEWABLE_TABLET | Freq: Once | ORAL | Status: AC
  Administered 2020-08-11: 324 mg via ORAL
  Filled 2020-08-11: qty 4

## 2020-08-11 MED ORDER — METRONIDAZOLE 500 MG PO TABS: 500.0000 mg | ORAL_TABLET | Freq: Three times a day (TID) | ORAL | 0 refills | Status: AC

## 2020-08-11 MED ORDER — CIPROFLOXACIN HCL 500 MG PO TABS
500.0000 mg | ORAL_TABLET | Freq: Once | ORAL | Status: AC
  Administered 2020-08-11: 500 mg via ORAL
  Filled 2020-08-11: qty 1

## 2020-08-11 MED ORDER — IOHEXOL 300 MG/ML  SOLN
100.0000 mL | Freq: Once | INTRAMUSCULAR | Status: AC | PRN
  Administered 2020-08-11: 100 mL via INTRAVENOUS

## 2020-08-11 MED ORDER — CIPROFLOXACIN HCL 500 MG PO TABS: 500.0000 mg | ORAL_TABLET | Freq: Two times a day (BID) | ORAL | 0 refills | Status: AC

## 2020-08-11 MED ORDER — FENTANYL CITRATE (PF) 100 MCG/2ML IJ SOLN
50.0000 ug | Freq: Once | INTRAMUSCULAR | Status: AC
  Administered 2020-08-11: 50 ug via INTRAVENOUS
  Filled 2020-08-11: qty 2

## 2020-08-11 MED ORDER — METRONIDAZOLE 500 MG PO TABS
500.0000 mg | ORAL_TABLET | Freq: Once | ORAL | Status: AC
  Administered 2020-08-11: 500 mg via ORAL
  Filled 2020-08-11: qty 1

## 2020-08-11 NOTE — ED Triage Notes (Signed)
Pt to ED POV for centralized cp, lower abdominal pain, shob, nausea that started this am.  Hx heart attack and stents placed in march.  Pt appears uncomfortable.  Recent chf dx

## 2020-08-11 NOTE — ED Provider Notes (Signed)
[facility] Emergency Department Provider Note  ____________________________________________   I have reviewed the triage vital signs and the nursing notes.   HISTORY  Chief Complaint Abdominal pain  History limited by: Not Limited   HPI Willie Clark is a 70 y.o. male who presents to the emergency department today with primary complaint of abdominal pain. States it is located in the suprapubic area. The patient says he started having some discomfort yesterday however when he woke up this morning the pain was more severe. It has been fairly constant throughout the day. He denies any associated dysuria or change in his bowel movement. The patient states he has also been having some chest discomfort since yesterday. Describes it as both a sharp and tight feeling across his center chest. The patient has had some shortness of breath. History of MI with stent placement and he does have nitroglycerin at home but did not try using it.   Records reviewed. Per medical record review patient has a history of CAD  Past Medical History:  Diagnosis Date  . Anxiety   . Arthritis   . Barrett's esophagus   . Bowel obstruction ([place]) 02/2009   small bowel  . Chronic chest pain   . Coronary artery disease    a. 1993 CABG; b. 2007 Redo CABG; c. 09/2015 Cath: LM mild dzs, LAD 100ost, RI mild dzs, LCX 60p, OM1 100, RCA 100ost, VG->OM2 min irregs, LIMA->LAD nl, VG->RPDA 30ost, 20d, nl EF->Med rx.  . Depression   . Diverticulitis   . Diverticulosis   . Erectile dysfunction   . Fibromyalgia   . Gastric ulcer   . Gastritis   . GERD (gastroesophageal reflux disease)   . Headache   . Hemorrhoids   . HFrEF (heart failure with reduced ejection fraction) ([place])    a. 09/2016 Echo: EF 55-60%, no rwma, triv AI, mild MR, mildly dil LA. -> as of April 2020: EF 40 to 45%.  Septal dyssynergy/hypokinesis due to postop state but otherwise unable to assess wall motion [name] due to poor study.   . History of myocardial infarct at age less than 37 years   . Hyperlipidemia   . Hypertension   . Kidney stones    going to [facility]  June 26 to see kidney specialist  . Lightheadedness   . Mass on back   . Nephrolithiasis   . Orthostatic hypotension    a. Improved after discontinuation of metoprolol.  . OSA (obstructive sleep apnea)    a. On CPAP.  [name] Pulmonary embolism ([place]) 01/2006   Post-op/treated  . Skin lesions, generalized   . Stroke [facility]) 1993   right side weakness no blood thinners   on Aspirin  . Syncope and collapse   . Urticaria     Patient Active Problem List   Diagnosis Date Noted  . HFrEF (heart failure with reduced ejection fraction) ([place])   . Acute coronary syndrome ([place]) 06/20/2020  . NSTEMI (non-ST elevated myocardial infarction) ([place]) 06/20/2020  . GERD (gastroesophageal reflux disease) 06/20/2020  . Unstable angina ([place]) 12/12/2019  . Hypokalemia 12/12/2019  . Depression 12/12/2019  . 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  . Chest pain 07/31/2018  . Carpal tunnel syndrome 04/06/2018  . 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  . Synovial cyst of lumbar facet joint 10/01/2016  . Kidney stones   . Ureteral obstruction   . Obstructive uropathy 09/24/2015  . Abnormal stress test   . 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  Chronic combined systolic and diastolic congestive heart failure ([place])   . Essential hypertension   . Preoperative cardiovascular examination   . Bilateral leg edema 01/10/2015  . TIA (transient ischemic attack) 08/13/2014  . Intermittent claudication ([place]) 06/07/2013  . Abdominal pain 05/16/2012  . Dyspnea 11/21/2011  . Sinus bradycardia 07/12/2010  . ED (erectile dysfunction) 07/12/2010  . Coronary artery disease involving native coronary artery of native heart with angina pectoris ([place])   . Hypertension   . Hyperlipidemia     Past Surgical History:  Procedure Laterality Date  . BACK SURGERY    . bowel obstruction   01/2009  . CARDIAC CATHETERIZATION  11/2006   patent grafts. Significant OM3 disease and occluded diagonals.  [name] CARDIAC CATHETERIZATION  06/2010   patent grafts. significant ISR in proximal LCX but OM2 is bypassed and gives retrograde flow to OM3.   [name] CARDIAC CATHETERIZATION  06/2013   [facility]: Patent grafts with 60% proximal in-stent restenosis in the left circumflex with FFR of 0.85  . CARDIAC CATHETERIZATION Left 09/25/2015   Procedure: Left Heart Cath and Cors/Grafts Angiography;  Surgeon: Wellington Hampshire, MD;  Location: [place] CV LAB;  Service: Cardiovascular;  Laterality: Left;  . CERVICAL FUSION    . CHOLECYSTECTOMY    . COLON SURGERY  01/2009   BOWEL RESECTION DUE TO SMALL BOWEL OBSTRUCTION  . COLONOSCOPY    . COLONOSCOPY WITH PROPOFOL N/A 10/28/2017   Procedure: COLONOSCOPY WITH PROPOFOL;  Surgeon: Lollie Sails, MD;  Location: [facility] ENDOSCOPY;  Service: Endoscopy;  Laterality: N/A;  . CORONARY ANGIOPLASTY  2006  . CORONARY ARTERY BYPASS GRAFT  1993/01/2006   redo at [facility]. LIMA to LAD, SVG to OM2 and SVG to RPDA  . CORONARY STENT INTERVENTION N/A 06/20/2020   Procedure: CORONARY STENT INTERVENTION;  Surgeon: Nelva Bush, MD;  Location: [place] CV LAB;  Service: Cardiovascular;  Laterality: N/A;  . CYSTOSCOPY WITH STENT PLACEMENT Bilateral 09/24/2015   Procedure: CYSTOSCOPY WITH BILATERAL RETROGRADES, BILATERAL STENT PLACEMENT;  Surgeon: Festus Aloe, MD;  Location: [facility] ORS;  Service: Urology;  Laterality: Bilateral;  . ESOPHAGOGASTRODUODENOSCOPY N/A 10/25/2014   Procedure: ESOPHAGOGASTRODUODENOSCOPY (EGD);  Surgeon: Lollie Sails, MD;  Location: [facility] ENDOSCOPY;  Service: Endoscopy;  Laterality: N/A;  . ESOPHAGOGASTRODUODENOSCOPY (EGD) WITH PROPOFOL N/A 02/03/2017   Procedure: ESOPHAGOGASTRODUODENOSCOPY (EGD) WITH PROPOFOL;  Surgeon: Lollie Sails, MD;  Location: [facility] ENDOSCOPY;  Service: Endoscopy;  Laterality: N/A;  . ESOPHAGOGASTRODUODENOSCOPY (EGD) WITH  PROPOFOL N/A 04/18/2017   Procedure: ESOPHAGOGASTRODUODENOSCOPY (EGD) WITH PROPOFOL;  Surgeon: Lollie Sails, MD;  Location: [facility] ENDOSCOPY;  Service: Endoscopy;  Laterality: N/A;  . EYE SURGERY Bilateral    Cataract Extraction with IOL  . HERNIA REPAIR    . INGUINAL HERNIA REPAIR Bilateral 01/23/2016   Procedure: HERNIA REPAIR INGUINAL ADULT BILATERAL;  Surgeon: Leonie Green, MD;  Location: [facility] ORS;  Service: General;  Laterality: Bilateral;  . LEFT HEART CATH AND CORS/GRAFTS ANGIOGRAPHY N/A 12/13/2019   Procedure: LEFT HEART CATH AND CORS/GRAFTS ANGIOGRAPHY;  Surgeon: Wellington Hampshire, MD;  Location: [place] CV LAB;  Service: Cardiovascular;  Laterality: N/A;  . LEFT HEART CATH AND CORS/GRAFTS ANGIOGRAPHY N/A 06/20/2020   Procedure: LEFT HEART CATH AND CORS/GRAFTS ANGIOGRAPHY;  Surgeon: Nelva Bush, MD;  Location: [place] CV LAB;  Service: Cardiovascular;  Laterality: N/A;  . LUMBAR LAMINECTOMY/DECOMPRESSION MICRODISCECTOMY Left 10/01/2016   Procedure: Left Lumbar four-five Laminotomy for resection of synovial cyst;  Surgeon: Erline Levine, MD;  Location: [place];  Service:  Neurosurgery;  Laterality: Left;  left  . NECK SURGERY  06/2009  . SHOULDER SURGERY  2010    Prior to Admission medications   Medication Sig Start Date End Date Taking? Authorizing Provider  albuterol (PROVENTIL HFA;VENTOLIN HFA) 108 (90 Base) MCG/ACT inhaler Inhale into the lungs every 4 (four) hours as needed for wheezing or shortness of breath.    [provider]  aspirin EC 81 MG EC tablet Take 1 tablet (81 mg total) by mouth daily. Swallow whole. 06/23/20   Nolberto Hanlon, MD  carvedilol (COREG) 3.125 MG tablet Take 1 tablet (3.125 mg total) by mouth 2 (two) times daily with a meal. 07/03/20 08/02/20  Wellington Hampshire, MD  citalopram (CELEXA) 20 MG tablet Take 20 mg by mouth daily.     [provider]  EPINEPHrine 0.3 mg/0.3 mL IJ SOAJ injection Inject 0.3 mg into the muscle as  needed.  12/27/19   [provider]  Erenumab-aooe 140 MG/ML SOAJ Inject 140 mg into the skin every 28 (twenty-eight) days.    [provider]  finasteride (PROSCAR) 5 MG tablet Take 5 mg by mouth daily.    [provider]  furosemide (LASIX) 20 MG tablet Take 1 tablet (20 mg total) by mouth daily. 08/07/20 11/05/20  Furth, Cadence H, PA-C  gabapentin (NEURONTIN) 800 MG tablet Take 1 tablet by mouth 3 (three) times daily. 12/22/19   [provider]  isosorbide mononitrate (IMDUR) 30 MG 24 hr tablet Take 0.5 tablets (15 mg total) by mouth daily. 06/29/20   Furth, Cadence H, PA-C  losartan (COZAAR) 25 MG tablet TAKE 1 TABLET EVERY DAY 07/31/20   Furth, Cadence H, PA-C  nitroGLYCERIN (NITROSTAT) 0.4 MG SL tablet Place 1 tablet (0.4 mg total) under the tongue every 5 (five) minutes as needed for chest pain. 06/22/20 07/22/20  Nolberto Hanlon, MD  ondansetron (ZOFRAN) 4 MG tablet Take 4 mg by mouth every 8 (eight) hours as needed for nausea or vomiting.    [provider]  pantoprazole (PROTONIX) 40 MG tablet Take 40 mg by mouth 2 (two) times daily.     [provider]  Peppermint Oil (IBGARD PO) Take by mouth daily at 2 am.    [provider]  potassium chloride (KLOR-CON) 10 MEQ tablet Take 1 tablet (10 mEq total) by mouth daily. 08/07/20 11/05/20  Furth, Cadence H, PA-C  Probiotic Product (PROBIOTIC PO) Take by mouth daily at 8 pm.    [provider]  rOPINIRole (REQUIP) 1 MG tablet Take 1 mg by mouth 2 (two) times daily. 05/18/20   [provider]  rosuvastatin (CRESTOR) 40 MG tablet Take 1 tablet (40 mg total) by mouth daily. 05/19/20   Wellington Hampshire, MD  spironolactone (ALDACTONE) 25 MG tablet Take 0.5 tablets (12.5 mg total) by mouth daily. 05/16/20   Wellington Hampshire, MD  ticagrelor (BRILINTA) 90 MG TABS tablet Take 90 mg by mouth 2 (two) times daily.    [provider]  VASCEPA 1 g capsule TAKE 2 CAPSULES TWICE DAILY  07/12/20   Wellington Hampshire, MD  vitamin B-12 (CYANOCOBALAMIN) 1000 MCG tablet Take 1,000 mcg by mouth daily.    [provider]    Allergies Amitriptyline, Cyclobenzaprine, Dicyclomine, Sacubitril-valsartan, Acetaminophen, Amitriptyline hcl, Bacon flavor, Hydrocodone, Fish oil, Lidocaine, Omega-3 fatty acids-vitamin e, Other, Oxycodone-acetaminophen, and Sulfa antibiotics  Family History  Problem Relation Age of Onset  . Heart disease Father 24  . Heart attack Father  Social History Social History   Tobacco Use  . Smoking status: Former Smoker    Packs/day: 2.50    Types: Cigarettes    Quit date: 08/21/1991    Years since quitting: 28.9  . Smokeless tobacco: Never Used  Vaping Use  . Vaping Use: Never used  Substance Use Topics  . Alcohol use: Not Currently  . Drug use: No    Review of Systems Constitutional: No fever/chills Eyes: No visual changes. ENT: No sore throat. Cardiovascular: Positive for chest pain. Respiratory: Positive for shortness of breath. Gastrointestinal: Positive for suprapubic pain, positive for nausea, no vomiting.  No diarrhea.   Genitourinary: Negative for dysuria. Musculoskeletal: Negative for back pain. Skin: Negative for rash. Neurological: Negative for headaches, focal weakness or numbness.  ____________________________________________   PHYSICAL EXAM:  VITAL SIGNS: ED Triage Vitals  Enc Vitals Group     BP 08/11/20 1858 125/78     Pulse Rate 08/11/20 1858 88     Resp 08/11/20 1858 (!) 22     Temp 08/11/20 1858 98.1 F (36.7 C)     Temp Source 08/11/20 1858 Oral     SpO2 08/11/20 1858 97 %     Weight 08/11/20 1855 198 lb (89.8 kg)     Height 08/11/20 1855 5\' 9"  (1.753 m)     Head Circumference --      Peak Flow --      Pain Score 08/11/20 1854 5   Constitutional: Alert and oriented.  Eyes: Conjunctivae are normal.  ENT      Head: Normocephalic and atraumatic.      Nose: No congestion/rhinnorhea.       Mouth/Throat: Mucous membranes are moist.      Neck: No stridor. Hematological/Lymphatic/Immunilogical: No cervical lymphadenopathy. Cardiovascular: Normal rate, regular rhythm.  No murmurs, rubs, or gallops.  Respiratory: Normal respiratory effort without tachypnea nor retractions. Breath sounds are clear and equal bilaterally. No wheezes/rales/rhonchi. Gastrointestinal: Soft and non tender. No rebound. No guarding.  Genitourinary: Deferred Musculoskeletal: Normal range of motion in all extremities. No lower extremity edema. Neurologic:  Normal speech and language. No gross focal neurologic deficits are appreciated.  Skin:  Skin is warm, dry and intact. No rash noted. Psychiatric: Mood and affect are normal. Speech and behavior are normal. Patient exhibits appropriate insight and judgment.  ____________________________________________    LABS (pertinent positives/negatives)  CBC wbc 11.4, hgb 14.0, plt 175 BMP wnl except glu 108 Trop hs 7 UA hazy, ketones 5, rbc 6-10, wbc 0-5 ____________________________________________   EKG  I, Nance Pear, attending physician, personally viewed and interpreted this EKG  EKG Time: 1858 Rate: 84 Rhythm: sinus rhythm Axis: normal Intervals: qtc 416 QRS: narrow, q waves v1 ST changes: no st elevation Impression: abnormal ekg  ____________________________________________    RADIOLOGY  CXR No active cardiopulmonary disease  ____________________________________________   PROCEDURES  Procedures  ____________________________________________   INITIAL IMPRESSION / ASSESSMENT AND PLAN / ED COURSE  Pertinent labs & imaging results that were available during my care of the patient were reviewed by me and considered in my medical decision making (see chart for details).   Patient presented to the emergency department today because of concerns for lower abdominal pain.  Located in the suprapubic region.  On exam he did have some  tenderness in the suprapubic region.  Urinalysis without obvious infection.  Given unclear etiology at this time will obtain a CT scan.  Additionally patient had secondary complaints of some chest pain.  Troponins were negative.  On reassessment he stated his chest pain had improved.   ____________________________________________   FINAL CLINICAL IMPRESSION(S) / ED DIAGNOSES  Final diagnoses:  Lower abdominal pain  Chest pain, unspecified type     Note: This dictation was prepared with Dragon dictation. Any transcriptional errors that result from this process are unintentional     Nance Pear, MD 08/11/20 2234

## 2020-08-11 NOTE — Progress Notes (Signed)
Daily Session Note  Patient Details  Name: Willie Clark MRN: [id number] Date of Birth: [birth year] Referring Provider:   Flowsheet Row Cardiac Rehab from 07/12/2020 in [facility] Cardiac and Pulmonary Rehab  Referring Provider End, Harrell Gave MD  [facility] Cardiologist: Dr. Rogue Jury Arida]      Encounter Date: 08/11/2020  Check In:  Session Check In - 08/11/20 1017      Check-In   Supervising physician immediately available to respond to emergencies See telemetry face sheet for immediately available ER MD    Location [facility]-Cardiac & Pulmonary Rehab    Staff Present Heath Lark, RN, BSN, CCRP;Meredith Sherryll Burger, RN BSN;Joseph Lou Miner, [place] Exercise Physiologist    Virtual Visit No    Medication changes reported     No    Fall or balance concerns reported    No    Warm-up and Cool-down Performed on first and last piece of equipment    Resistance Training Performed Yes    VAD Patient? No    PAD/SET Patient? No      Pain Assessment   Currently in Pain? No/denies              Social History   Tobacco Use  Smoking Status Former Smoker  . Packs/day: 2.50  . Types: Cigarettes  . Quit date: 08/21/1991  . Years since quitting: 28.9  Smokeless Tobacco Never Used    Goals Met:  Independence with exercise equipment Exercise tolerated well No report of cardiac concerns or symptoms  Goals Unmet:  Not Applicable  Comments: Pt able to follow exercise prescription today without complaint.  Will continue to monitor for progression.    Dr. Emily Filbert is Medical Director for [place] and [facility].

## 2020-08-11 NOTE — ED Notes (Signed)
Assigned as Primary RN at this time. First encounter with the patient. Patient is lying comfortably at this time, IV placed, and in no acute distress. Will continue to monitor and assess.

## 2020-08-14 ENCOUNTER — Encounter: Payer: Medicare HMO | Admitting: *Deleted

## 2020-08-14 ENCOUNTER — Other Ambulatory Visit: Payer: Self-pay

## 2020-08-14 DIAGNOSIS — I214 Non-ST elevation (NSTEMI) myocardial infarction: Secondary | ICD-10-CM

## 2020-08-14 DIAGNOSIS — Z955 Presence of coronary angioplasty implant and graft: Secondary | ICD-10-CM

## 2020-08-14 NOTE — Progress Notes (Signed)
Daily Session Note  Patient Details  Name: Willie Clark MRN: [id number] Date of Birth: 1950-06-24 Referring Provider:   Flowsheet Row Cardiac Rehab from 07/12/2020 in [facility] Cardiac and Pulmonary Rehab  Referring Provider End, Harrell Gave MD  [facility] Cardiologist: Dr. Rogue Jury Arida]      Encounter Date: 08/14/2020  Check In:  Session Check In - 08/14/20 1046      Check-In   Supervising physician immediately available to respond to emergencies See telemetry face sheet for immediately available ER MD    Location [facility]-Cardiac & Pulmonary Rehab    Staff Present Renita Papa, RN BSN;Joseph [address] [place], [place], ACSM CEP, Exercise Physiologist    Virtual Visit No    Medication changes reported     No    Fall or balance concerns reported    No    Warm-up and Cool-down Performed on first and last piece of equipment    Resistance Training Performed Yes    VAD Patient? No    PAD/SET Patient? No      Pain Assessment   Currently in Pain? No/denies              Social History   Tobacco Use  Smoking Status Former Smoker  . Packs/day: 2.50  . Types: Cigarettes  . Quit date: 08/21/1991  . Years since quitting: 29.0  Smokeless Tobacco Never Used    Goals Met:  Independence with exercise equipment Exercise tolerated well No report of cardiac concerns or symptoms Strength training completed today  Goals Unmet:  Not Applicable  Comments: Pt able to follow exercise prescription today without complaint.  Will continue to monitor for progression.    Dr. Emily Filbert is Medical Director for [place] and [facility].

## 2020-08-15 NOTE — Progress Notes (Signed)
Cardiology Office Note:    Date:  08/17/2020   ID:  Willie Clark, DOB 07/19/1950, MRN [id number]  PCP:  [name], MD  [facility] Cardiologist:  [name], MD  [facility] Electrophysiologist:  None   Referring MD: [name], MD   Chief Complaint: 1 week follow-up  History of Present Illness:    Willie Clark is a 70 y.o. male with a hx of CAD s/p CABG in 1993 with redo in 2007 s/p PCI to SVG-RCA 06/20/20, HGrEF/ICM, HLD, HTN, GERD, nephrolithiasis, OSA, stroke, prior post-op PE, PFO closure 2007.   Patient was hospitalized in August 2021 with chest pain. Echo showed EF 40-45%. Cath showed severe underlying 3V CAD with patientt grafts and no significant change since 2017. The patient's medications were switched to losartan and carvedilol. Rosuvastatin was increased to 40mg  daily. Losartan was switched to [facility] however patient had allergic reaction. Spironolactone was added.   Patient was seen in the office 06/20/20 by Dr. Fletcher Anon and was having active chest pain. EKG showed minor STE with reciprocal changes and he was brought up to the cath lab for emergent cath. Cath showed CTO ostial LAD, and RCA, mild to mod IST left Cx stent, widely patent LIMA to LAD, patent SVG to OM2 with 60-70% ostial/proximal graft stenosis, subtotally occluded SVG-RCA with long segmental proximal graft disease 89% with TIMI flow 1, moderately reduced EF with inferior akinesis. He was treated with successful PCI to SVG-RCA with overlapping stents x 3. For recurrent chest pain consider PCI to vein graft to OM2. He was started on DAPT with Aspirin and Brilinta for 12 months. Echo showed LVEF 35-40%. He was continued on Coreg and losartan. At follow-up Imdur was added.   He was seen 08/07/20 post hospitalization and reported chest tightness an SOB. Weight was up 8lbs since the last visit. BNP was normal. He was started on lasix 20mg  with K+. If symptoms persist can increase Imdur vs re-cath.    Went to the ER 4/22 for abdominal pain. CT showed acute diverticulitis. Did not give fluids. Gave abx for diverticulitis. He is taking these. Still having chest tightness.   Today patient reports symptoms have gotten worse despite taking lasix 20mg . He still has DOE and lower leg edema. By his scales weight is up. Has the same chest tightness as before. Also very tired. Notes say he was on 40mg  prior to admission. likely needs higher dose for a couple days. Weight is the same as appointment last week. RedsVest 36%.  Increase imdur 30mg  daily Lasix 40mg  BID for 3 days, then lasix 40mg  daily.  March 10 194lbs. Today 201lbs.   Past Medical History:  Diagnosis Date  . Anxiety   . Arthritis   . Barrett's esophagus   . Bowel obstruction ([place]) 02/2009   small bowel  . Chronic chest pain   . Coronary artery disease    a. 1993 CABG; b. 2007 Redo CABG; c. 09/2015 Cath: LM mild dzs, LAD 100ost, RI mild dzs, LCX 60p, OM1 100, RCA 100ost, VG->OM2 min irregs, LIMA->LAD nl, VG->RPDA 30ost, 20d, nl EF->Med rx.  . Depression   . Diverticulitis   . Diverticulosis   . Erectile dysfunction   . Fibromyalgia   . Gastric ulcer   . Gastritis   . GERD (gastroesophageal reflux disease)   . Headache   . Hemorrhoids   . HFrEF (heart failure with reduced ejection fraction) ([place])    a. 09/2016 Echo: EF 55-60%, no rwma,  triv AI, mild MR, mildly dil LA. -> as of April 2020: EF 40 to 45%.  Septal dyssynergy/hypokinesis due to postop state but otherwise unable to assess wall motion Gershon Mussel due to poor study.  . History of myocardial infarct at age less than 60 years   . Hyperlipidemia   . Hypertension   . Kidney stones    going to [facility]  June 26 to see kidney specialist  . Lightheadedness   . Mass on back   . Nephrolithiasis   . Orthostatic hypotension    a. Improved after discontinuation of metoprolol.  . OSA (obstructive sleep apnea)    a. On CPAP.  [name] Pulmonary embolism (HCC) 01/2006    Post-op/treated  . Skin lesions, generalized   . Stroke [facility]) 1993   right side weakness no blood thinners   on Aspirin  . Syncope and collapse   . Urticaria     Past Surgical History:  Procedure Laterality Date  . BACK SURGERY    . bowel obstruction  01/2009  . CARDIAC CATHETERIZATION  11/2006   patent grafts. Significant OM3 disease and occluded diagonals.  [name] CARDIAC CATHETERIZATION  06/2010   patent grafts. significant ISR in proximal LCX but OM2 is bypassed and gives retrograde flow to OM3.   [name] CARDIAC CATHETERIZATION  06/2013   [facility]: Patent grafts with 60% proximal in-stent restenosis in the left circumflex with FFR of 0.85  . CARDIAC CATHETERIZATION Left 09/25/2015   Procedure: Left Heart Cath and Cors/Grafts Angiography;  Surgeon: Iran Ouch, MD;  Location: [facility] INVASIVE CV LAB;  Service: Cardiovascular;  Laterality: Left;  . CERVICAL FUSION    . CHOLECYSTECTOMY    . COLON SURGERY  01/2009   BOWEL RESECTION DUE TO SMALL BOWEL OBSTRUCTION  . COLONOSCOPY    . COLONOSCOPY WITH PROPOFOL N/A 10/28/2017   Procedure: COLONOSCOPY WITH PROPOFOL;  Surgeon: Christena Deem, MD;  Location: [facility] ENDOSCOPY;  Service: Endoscopy;  Laterality: N/A;  . CORONARY ANGIOPLASTY  2006  . CORONARY ARTERY BYPASS GRAFT  1993/01/2006   redo at [facility]. LIMA to LAD, SVG to OM2 and SVG to RPDA  . CORONARY STENT INTERVENTION N/A 06/20/2020   Procedure: CORONARY STENT INTERVENTION;  Surgeon: Yvonne Kendall, MD;  Location: [facility] INVASIVE CV LAB;  Service: Cardiovascular;  Laterality: N/A;  . CYSTOSCOPY WITH STENT PLACEMENT Bilateral 09/24/2015   Procedure: CYSTOSCOPY WITH BILATERAL RETROGRADES, BILATERAL STENT PLACEMENT;  Surgeon: Jerilee Field, MD;  Location: [facility] ORS;  Service: Urology;  Laterality: Bilateral;  . ESOPHAGOGASTRODUODENOSCOPY N/A 10/25/2014   Procedure: ESOPHAGOGASTRODUODENOSCOPY (EGD);  Surgeon: Christena Deem, MD;  Location[facility] END[facility]COPY;  Service: Endoscopy;  Laterality: N/A;  .  ESOPHAGOGASTRODUODENOSCOPY (EGD) WITH PROPOFOL N/A 02/03/2017   Procedure: ESOPHAGOGASTRODUODENOSCOPY (EGD) WITH PROPOFOL;  Surgeon: Christena Deem, MD;  Location: [facility] ENDOSCOPY;  Service: Endoscopy;  Laterality: N/A;  . ESOPHAGOGASTRODUODENOSCOPY (EGD) WITH PROPOFOL N/A 04/18/2017   Procedure: ESOPHAGOGASTRODUODENOSCOPY (EGD) WITH PROPOFOL;  Surgeon: Christena Deem, MD;  Location: [facility] ENDOSCOPY;  Service: Endoscopy;  Laterality: N/A;  . EYE SURGERY Bilateral    Cataract Extraction with IOL  . HERNIA REPAIR    . INGUINAL HERNIA REPAIR Bilateral 01/23/2016   Procedure: HERNIA REPAIR INGUINAL ADULT BILATERAL;  Surgeon: Nadeen Landau, MD;  Location: [facility] ORS;  Service: General;  Laterality: Bilateral;  . LEFT HEART CATH AND CORS/GRAFTS ANGIOGRAPHY N/A 12/13/2019   Procedure: LEFT HEART CATH AND CORS/GRAFTS ANGIOGRAPHY;  Surgeon: Iran Ouch, MD;  Location: [facility] INVASIVE CV LAB;  Service:  Cardiovascular;  Laterality: N/A;  . LEFT HEART CATH AND CORS/GRAFTS ANGIOGRAPHY N/A 06/20/2020   Procedure: LEFT HEART CATH AND CORS/GRAFTS ANGIOGRAPHY;  Surgeon: Nelva Bush, MD;  Location: [place] CV LAB;  Service: Cardiovascular;  Laterality: N/A;  . LUMBAR LAMINECTOMY/DECOMPRESSION MICRODISCECTOMY Left 10/01/2016   Procedure: Left Lumbar four-five Laminotomy for resection of synovial cyst;  Surgeon: Erline Levine, MD;  Location: [place];  Service: Neurosurgery;  Laterality: Left;  left  . NECK SURGERY  06/2009  . SHOULDER SURGERY  2010    Current Medications: Current Meds  Medication Sig  . albuterol (PROVENTIL HFA;VENTOLIN HFA) 108 (90 Base) MCG/ACT inhaler Inhale into the lungs every 4 (four) hours as needed for wheezing or shortness of breath.  [name] aspirin EC 81 MG EC tablet Take 1 tablet (81 mg total) by mouth daily. Swallow whole.  . carvedilol (COREG) 3.125 MG tablet Take 1 tablet (3.125 mg total) by mouth 2 (two) times daily with a meal.  . ciprofloxacin (CIPRO) 500 MG tablet  Take 1 tablet (500 mg total) by mouth 2 (two) times daily for 7 days.  . citalopram (CELEXA) 20 MG tablet Take 20 mg by mouth daily.   [name] EPINEPHrine 0.3 mg/0.3 mL IJ SOAJ injection Inject 0.3 mg into the muscle as needed.   Eduard Roux 140 MG/ML SOAJ Inject 140 mg into the skin every 28 (twenty-eight) days.  . finasteride (PROSCAR) 5 MG tablet Take 5 mg by mouth daily.  . furosemide (LASIX) 40 MG tablet Take 1 tablet (40 mg total) by mouth 2 (two) times daily for 3 days, THEN 1 tablet (40 mg total) daily for 27 days.  [name] gabapentin (NEURONTIN) 800 MG tablet Take 1 tablet by mouth 3 (three) times daily.  [name] losartan (COZAAR) 25 MG tablet TAKE 1 TABLET EVERY DAY  . metroNIDAZOLE (FLAGYL) 500 MG tablet Take 1 tablet (500 mg total) by mouth 3 (three) times daily for 7 days.  . nitroGLYCERIN (NITROSTAT) 0.4 MG SL tablet Place 1 tablet (0.4 mg total) under the tongue every 5 (five) minutes as needed for chest pain.  [name] ondansetron (ZOFRAN) 4 MG tablet Take 4 mg by mouth every 8 (eight) hours as needed for nausea or vomiting.  . pantoprazole (PROTONIX) 40 MG tablet Take 40 mg by mouth 2 (two) times daily.   [name] Peppermint Oil (IBGARD PO) Take by mouth daily at 2 am.  . potassium chloride (KLOR-CON) 10 MEQ tablet Take 1 tablet (10 mEq total) by mouth daily.  . Probiotic Product (PROBIOTIC PO) Take by mouth daily at 8 pm.  . rOPINIRole (REQUIP) 1 MG tablet Take 1 mg by mouth 2 (two) times daily.  . rosuvastatin (CRESTOR) 40 MG tablet Take 1 tablet (40 mg total) by mouth daily.  [name] spironolactone (ALDACTONE) 25 MG tablet Take 0.5 tablets (12.5 mg total) by mouth daily.  . ticagrelor (BRILINTA) 90 MG TABS tablet Take 90 mg by mouth 2 (two) times daily.  [name] VASCEPA 1 g capsule TAKE 2 CAPSULES TWICE DAILY  . vitamin B-12 (CYANOCOBALAMIN) 1000 MCG tablet Take 1,000 mcg by mouth daily.  . [DISCONTINUED] furosemide (LASIX) 20 MG tablet Take 1 tablet (20 mg total) by mouth daily.  . [DISCONTINUED] isosorbide  mononitrate (IMDUR) 30 MG 24 hr tablet Take 0.5 tablets (15 mg total) by mouth daily.     Allergies:   Amitriptyline, Cyclobenzaprine, Dicyclomine, Sacubitril-valsartan, Acetaminophen, Amitriptyline hcl, Bacon flavor, Hydrocodone, Fish oil, Lidocaine, Omega-3 fatty acids-vitamin e, Other, Oxycodone-acetaminophen, and Sulfa antibiotics   Social  History   Socioeconomic History  . Marital status: Married    Spouse name: Not on file  . Number of children: Not on file  . Years of education: Not on file  . Highest education level: Not on file  Occupational History  . Not on file  Tobacco Use  . Smoking status: Former Smoker    Packs/day: 2.50    Types: Cigarettes    Quit date: 08/21/1991    Years since quitting: 29.0  . Smokeless tobacco: Never Used  Vaping Use  . Vaping Use: Never used  Substance and Sexual Activity  . Alcohol use: Not Currently  . Drug use: No  . Sexual activity: Not on file  Other Topics Concern  . Not on file  Social History Narrative  . Not on file   Social Determinants of Health   Financial Resource Strain: Not on file  Food Insecurity: Not on file  Transportation Needs: Not on file  Physical Activity: Not on file  Stress: Not on file  Social Connections: Not on file     Family History: The patient's *family history includes Heart attack in his father; Heart disease (age of onset: 32) in his father.  ROS:   Please see the history of present illness.     All other systems reviewed and are negative.  EKGs/Labs/Other Studies Reviewed:    The following studies were reviewed today: Echo 06/20/2020 1. Left ventricular ejection fraction, by estimation, is 35 to 40%. The  left ventricle has moderately decreased function. The left ventricle  demonstrates regional wall motion abnormalities (see scoring  diagram/findings for description). Left ventricular  diastolic parameters are consistent with Grade I diastolic dysfunction  (impaired relaxation).  There is severe hypokinesis of the left  ventricular, entire inferolateral wall.  2. Right ventricular systolic function is low normal. The right  ventricular size is normal.  3. The mitral valve is normal in structure. No evidence of mitral valve  regurgitation.  4. The aortic valve is tricuspid. Aortic valve regurgitation is mild.  5. The inferior vena cava is normal in size with greater than 50%  respiratory variability, suggesting right atrial pressure of 3 mmHg.   Cardiac cath 06/20/20 Conclusions: 1. Severe native CAD, including chronic total occlusions of the ostial LAD and RCA. 2. Patent LCx stent with mild to moderate in-stent restenosis. 3. Widely patent LIMA-LAD. 4. Patent SVG-OM2 with 60-70% ostial/proximal graft stenosis. 5. Subtotally occluded SVG-RCA with long segment of proximal graft disease of up to 80% and 99% stenosis at the distal anastomosis with TIMI-1 flow. 6. Moderately reduced left ventricular contraction with inferior akinesis. Normal left ventricular filling pressure. 7. Successful PCI to SVG-RCA with overlapping Resolute Onyx 4.0 x 26 mm and 4.0 x 38 mm proximal and 2.5 x 18 mm distal (extending into rPDA) drug-eluting stents.  Recommendations: 1. Continue tirofiban infusion x 6 hours. 2. Dual antiplatelet therapy with aspirin and tigagrelor for at least 12 months. 3. Aggressive secondary prevention. 4. If patient has recurrent chest pain, consider PCI to SVG-OM2. 5. Follow-up echocardiogram.  Nelva Bush, MD [facility]   Coronary Diagrams   Diagnostic Dominance: Right    Intervention      EKG:  EKG is  ordered today.  The ekg ordered today demonstrates NSR, 62bpm, TWI inf leads.   Recent Labs: 12/12/2019: Magnesium 2.7 06/20/2020: ALT 33 08/07/2020: NT-Pro BNP 242; TSH 1.960 08/11/2020: BUN 11; Creatinine, Ser 1.09; Hemoglobin 14.0; Platelets 175; Potassium 3.7; Sodium 138  Recent Lipid Panel  Component Value  Date/Time   CHOL 165 02/07/2020 0658   TRIG 317 (H) 02/07/2020 0658   HDL 41 02/07/2020 0658   CHOLHDL 4.0 02/07/2020 0658   VLDL 63 (H) 02/07/2020 0658   LDLCALC 61 02/07/2020 0658      Physical Exam:    VS:  BP 122/70 (BP Location: Left Arm, Patient Position: Sitting, Cuff Size: Normal)   Pulse 62   Ht 5\' 9"  (1.753 m)   Wt 201 lb (91.2 kg)   SpO2 95%   BMI 29.68 kg/m     Wt Readings from Last 3 Encounters:  08/16/20 201 lb (91.2 kg)  08/11/20 198 lb (89.8 kg)  08/07/20 202 lb (91.6 kg)     GEN: Well nourished, well developed in no acute distress HEENT: Normal NECK: No JVD; No carotid bruits LYMPHATICS: No lymphadenopathy CARDIAC: RRR, no murmurs, rubs, gallops RESPIRATORY:  Clear to auscultation without rales, wheezing or rhonchi  ABDOMEN: Soft, non-tender, non-distended MUSCULOSKELETAL:  No edema; No deformity  SKIN: Warm and dry NEUROLOGIC:  Alert and oriented x 3 PSYCHIATRIC:  Normal affect   ASSESSMENT:    1. Coronary artery disease involving native coronary artery of native heart with unstable angina pectoris ([place])   2. Chronic systolic heart failure ([place])   3. Shortness of breath   4. Chest tightness   5. OSA (obstructive sleep apnea)   6. Essential hypertension    PLAN:    In order of problems listed above:  Chest tightness and SOB CAD s/p CABG with recent PCI with SVG-RCA 06/20/20 Patient reports symptoms are not better. Still has chest tightness and SOB. Has volume overload on exam. He is still doing cardiac rehab. I will increase Imdur to 30mg  daily. Also will increase diuretic. Will see back in a week to assess volume status. If volume status is improved but still have chest tightness might need repeat cardiac cath. Continue Aspirin, Brilinta, Statin, BB  HFrF/ICM He has been on lasix 20mg  daily. Weights today the same as the last visit. Has 1-2+ lower led edema on exam. RedsVest 36% today. PTA he was on lasix 40mg  daily. I will increase lasix to  40mg  BID for 3 days and then go back down to 40mg  daily. We will see him back in a week. Labs at follow-up. Continue Losartan, coreg, spiro.   HTN Elevated but has some extra volume. Continue current medications  OSA Continue CPAP  Disposition: Follow up in 1 week(s) with APP     Signed, Alayha Babineaux Ninfa Meeker, PA-C  08/17/2020 4:27 PM    [facility]

## 2020-08-16 ENCOUNTER — Ambulatory Visit: Payer: Medicare HMO | Admitting: Medical

## 2020-08-16 ENCOUNTER — Other Ambulatory Visit: Payer: Self-pay

## 2020-08-16 ENCOUNTER — Encounter: Payer: Self-pay | Admitting: Medical

## 2020-08-16 VITALS — BP 122/70 | HR 62 | Ht 69.0 in | Wt 201.0 lb

## 2020-08-16 DIAGNOSIS — R0602 Shortness of breath: Secondary | ICD-10-CM | POA: Diagnosis not present

## 2020-08-16 DIAGNOSIS — G4733 Obstructive sleep apnea (adult) (pediatric): Secondary | ICD-10-CM

## 2020-08-16 DIAGNOSIS — I10 Essential (primary) hypertension: Secondary | ICD-10-CM

## 2020-08-16 DIAGNOSIS — I5022 Chronic systolic (congestive) heart failure: Secondary | ICD-10-CM

## 2020-08-16 DIAGNOSIS — I25110 Atherosclerotic heart disease of native coronary artery with unstable angina pectoris: Secondary | ICD-10-CM | POA: Diagnosis not present

## 2020-08-16 DIAGNOSIS — R0789 Other chest pain: Secondary | ICD-10-CM | POA: Diagnosis not present

## 2020-08-16 DIAGNOSIS — Z955 Presence of coronary angioplasty implant and graft: Secondary | ICD-10-CM

## 2020-08-16 DIAGNOSIS — I214 Non-ST elevation (NSTEMI) myocardial infarction: Secondary | ICD-10-CM | POA: Diagnosis not present

## 2020-08-16 MED ORDER — ISOSORBIDE MONONITRATE ER 30 MG PO TB24: 30.0000 mg | ORAL_TABLET | Freq: Every day | ORAL | 3 refills | Status: DC

## 2020-08-16 MED ORDER — FUROSEMIDE 40 MG PO TABS: ORAL_TABLET | ORAL | 0 refills | Status: DC

## 2020-08-16 NOTE — Progress Notes (Signed)
Daily Session Note  Patient Details  Name: Willie Clark MRN: [id number] Date of Birth: 04-Feb-1951 Referring Provider:   Flowsheet Row Cardiac Rehab from 07/12/2020 in [facility] Cardiac and Pulmonary Rehab  Referring Provider End, Harrell Gave MD  [facility] Cardiologist: Dr. Rogue Jury Arida]      Encounter Date: 08/16/2020  Check In:  Session Check In - 08/16/20 0957      Check-In   Supervising physician immediately available to respond to emergencies See telemetry face sheet for immediately available ER MD    Location [facility]-Cardiac & Pulmonary Rehab    Staff Present Birdie Sons, MPA, RN;Amanda Oletta Darter, BA, ACSM CEP, Exercise Physiologist;Joseph Tessie Fass RCP,RRT,BSRT    Virtual Visit No    Medication changes reported     No    Fall or balance concerns reported    No    Warm-up and Cool-down Performed on first and last piece of equipment    Resistance Training Performed Yes    VAD Patient? No    PAD/SET Patient? No      Pain Assessment   Currently in Pain? No/denies              Social History   Tobacco Use  Smoking Status Former Smoker  . Packs/day: 2.50  . Types: Cigarettes  . Quit date: 08/21/1991  . Years since quitting: 29.0  Smokeless Tobacco Never Used    Goals Met:  Independence with exercise equipment Exercise tolerated well No report of cardiac concerns or symptoms Strength training completed today  Goals Unmet:  Not Applicable  Comments: Pt able to follow exercise prescription today without complaint.  Will continue to monitor for progression.    Dr. Emily Filbert is Medical Director for [facility].

## 2020-08-16 NOTE — Patient Instructions (Addendum)
Medication Instructions:  Your physician has recommended you make the following change in your medication:   1. TAKE Furosemide 40 mg twice a day for 3 days THEN decrease to 40 mg once daily  2. INCREASE Isosorbide mononitrate to 30 mg once daily   *If you need a refill on your cardiac medications before your next appointment, please call your pharmacy*   Lab Work: None  If you have labs (blood work) drawn today and your tests are completely normal, you will receive your results only by: [name] MyChart Message (if you have MyChart) OR . A paper copy in the mail If you have any lab test that is abnormal or we need to change your treatment, we will call you to review the results.   Testing/Procedures: None   Follow-Up: At [facility], you and your health needs are our priority.  As part of our continuing mission to provide you with exceptional heart care, we have created designated Provider Care Teams.  These Care Teams include your primary Cardiologist (physician) and Advanced Practice Providers (APPs -  Physician Assistants and Nurse Practitioners) who all work together to provide you with the care you need, when you need it.   Your next appointment:   1 week(s)  The format for your next appointment:   In Person  Provider:   You may see [name], MD or one of the following Advanced Practice Providers on your designated Care Team:    Murray Hodgkins, NP  Christell Faith, PA-C  Marrianne Mood, PA-C  Cadence Dalton, [place]  Laurann Montana, NP

## 2020-08-17 ENCOUNTER — Encounter: Payer: Self-pay | Admitting: Medical

## 2020-08-18 ENCOUNTER — Other Ambulatory Visit: Payer: Self-pay | Admitting: Gastroenterology

## 2020-08-18 DIAGNOSIS — Z8719 Personal history of other diseases of the digestive system: Secondary | ICD-10-CM

## 2020-08-21 ENCOUNTER — Other Ambulatory Visit: Payer: Self-pay

## 2020-08-21 ENCOUNTER — Encounter: Payer: Medicare HMO | Attending: Internal Medicine

## 2020-08-21 DIAGNOSIS — Z955 Presence of coronary angioplasty implant and graft: Secondary | ICD-10-CM | POA: Diagnosis present

## 2020-08-21 DIAGNOSIS — I214 Non-ST elevation (NSTEMI) myocardial infarction: Secondary | ICD-10-CM | POA: Diagnosis present

## 2020-08-21 NOTE — Progress Notes (Signed)
Daily Session Note  Patient Details  Name: Willie Clark MRN: [id number] Date of Birth: July 16, 1950 Referring Provider:   Flowsheet Row Cardiac Rehab from 07/12/2020 in [facility] Cardiac and Pulmonary Rehab  Referring Provider End, Harrell Gave MD  [facility] Cardiologist: Dr. Rogue Jury Arida]      Encounter Date: 08/21/2020  Check In:  Session Check In - 08/21/20 0954      Check-In   Supervising physician immediately available to respond to emergencies See telemetry face sheet for immediately available ER MD    Location [facility]-Cardiac & Pulmonary Rehab    Staff Present Alberteen Sam, MA, RCEP, CCRP, CCET;Amanda Sommer, BA, ACSM CEP, Exercise Physiologist;Danise Dehne RN, Moises Blood, BS, ACSM CEP, Exercise Physiologist;Joseph Hood RCP,RRT,BSRT    Virtual Visit No    Medication changes reported     No    Fall or balance concerns reported    No    Warm-up and Cool-down Performed on first and last piece of equipment    Resistance Training Performed Yes    VAD Patient? No    PAD/SET Patient? No      Pain Assessment   Currently in Pain? No/denies              Social History   Tobacco Use  Smoking Status Former Smoker  . Packs/day: 2.50  . Types: Cigarettes  . Quit date: 08/21/1991  . Years since quitting: 29.0  Smokeless Tobacco Never Used    Goals Met:  Proper associated with RPD/PD & O2 Sat Independence with exercise equipment Exercise tolerated well No report of cardiac concerns or symptoms Strength training completed today  Goals Unmet:  Not Applicable  Comments: Pt able to follow exercise prescription today without complaint.  Will continue to monitor for progression.   Dr. Emily Filbert is Medical Director for [place] [facility].

## 2020-08-23 NOTE — Telephone Encounter (Addendum)
Dr. Fletcher Anon and Cadence: This patient had recent PCI and has been seen in the ER since that time for ongoing chest tightness. Given his ongoing symptoms, dental cleaning was postponed until he could be evaluated. He was just seen by [name] PAC and continued to have chest discomfort. Medications were adjusted. He will be seen again on 08/25/20.   Cadence, can you please address clearance for dental cleaning at that appt?  Thanks so much Angie

## 2020-08-23 NOTE — Telephone Encounter (Signed)
Left detailed message for dental office pt has appt 08/25/20 for pre op assessment. Our office will fax clearance once pt has been cleared. See previous notes.

## 2020-08-23 NOTE — Telephone Encounter (Signed)
Dental office calling to check on status of dental cleaning States they pushed out the date til after 04/18 for today but never received clearance Please review and advise

## 2020-08-24 NOTE — Progress Notes (Addendum)
Cardiology Office Note:    Date:  08/25/2020   ID:  Willie Clark, DOB 09-25-1950, MRN [id number]  PCP:  [name], MD  [facility] Cardiologist:  [name], MD  [facility] Electrophysiologist:  None   Referring MD: [name], MD   Chief Complaint: 1 week follow-up  History of Present Illness:    Willie Clark is a 70 y.o. male with a hx of CAD s/p CABG in 75 with redo in 2007 s/p PCI to SVG-RCA 06/20/20, HFrEF/ICM, HLD, HTN, GERD, nephrolithiasis, OSA, stroke, prior post-op PE, PFO closure 2007.   Patient was seen in the office 06/20/2020 by Dr. Ardeen Fillers and was having active chest pain.  EKG showed minor ST elevation with reciprocal changes and he was brought up to the Cath Lab for emergent cath.  Cath showed CTO ostial LAD, RCA, mild to moderate ISR left circumflex stent, widely patent LIMA to LAD, patent SVG to OM 2 with 60 to 70% ostial-proximal graft stenosis, subtotally occluded SVG-RCA with long segmental proximal graft disease 89% TIMI flow 1, moderately Doose EF with inferior akinesis.  He was treated with successful PCI to SVG-RCA with overlapping stents x3.  For recurrent chest pain consider PCI to vein graft to OM 2.  He was started on DAPT with aspirin and Brilinta for 12 months.  Echo showed LVEF 35 to 40%.  He was continued on Coreg and losartan.  At follow-up Imdur was added.  Patient was seen 08/07/2020 for post hospital visit and had chest tightness and shortness of breath.  Weight was up 8 pounds.  BNP normal.  He was started on 20 mg Lasix with potassium.  Patient went to the ER 422 for abdominal pain.  CT showed acute diverticulitis and was given antibiotics.  Was seen back 08/16/2020 reported no change in symptoms despite Lasix 20 mg.  RedsVest 36%.  Imdur was increased.  Lasix was increased to 40 mg twice daily for 3 days then down to 40 mg daily.   Weight  March 10 194lbs 4/27 201lbs 5/6 201lbs  Today, the patient reports he still has lower  leg edema. It was better with the 80mg  daily. Weight is the same today as the last visit.  He says at home weights are slowly climbing up. Can't lay flat on back due to orthopnea. Can lay on the side though. Still having chest tightness, this is the same. Worse when he walks or does something. Worse with deep breath. He elevates legs. No compression socks with shorts.    Dental cleaning. Will not have anesthesia. [place] for dental cleaning.   BP soft today. this am it was 120/70.   Past Medical History:  Diagnosis Date  . Anxiety   . Arthritis   . Barrett's esophagus   . Bowel obstruction ([place]) 02/2009   small bowel  . Chronic chest pain   . Coronary artery disease    a. 1993 CABG; b. 2007 Redo CABG; c. 09/2015 Cath: LM mild dzs, LAD 100ost, RI mild dzs, LCX 60p, OM1 100, RCA 100ost, VG->OM2 min irregs, LIMA->LAD nl, VG->RPDA 30ost, 20d, nl EF->Med rx.  . Depression   . Diverticulitis   . Diverticulosis   . Erectile dysfunction   . Fibromyalgia   . Gastric ulcer   . Gastritis   . GERD (gastroesophageal reflux disease)   . Headache   . Hemorrhoids   . HFrEF (heart failure with reduced ejection fraction) ([place])    a. 09/2016 Echo:  EF 55-60%, no rwma, triv AI, mild MR, mildly dil LA. -> as of April 2020: EF 40 to 45%.  Septal dyssynergy/hypokinesis due to postop state but otherwise unable to assess wall motion [name] due to poor study.  . History of myocardial infarct at age less than 66 years   . Hyperlipidemia   . Hypertension   . Kidney stones    going to [facility]  June 26 to see kidney specialist  . Lightheadedness   . Mass on back   . Nephrolithiasis   . Orthostatic hypotension    a. Improved after discontinuation of metoprolol.  . OSA (obstructive sleep apnea)    a. On CPAP.  [name] Pulmonary embolism ([place]) 01/2006   Post-op/treated  . Skin lesions, generalized   . Stroke [facility]) 1993   right side weakness no blood thinners   on Aspirin  . Syncope and collapse   . Urticaria      Past Surgical History:  Procedure Laterality Date  . BACK SURGERY    . bowel obstruction  01/2009  . CARDIAC CATHETERIZATION  11/2006   patent grafts. Significant OM3 disease and occluded diagonals.  [name] CARDIAC CATHETERIZATION  06/2010   patent grafts. significant ISR in proximal LCX but OM2 is bypassed and gives retrograde flow to OM3.   [name] CARDIAC CATHETERIZATION  06/2013   [facility]: Patent grafts with 60% proximal in-stent restenosis in the left circumflex with FFR of 0.85  . CARDIAC CATHETERIZATION Left 09/25/2015   Procedure: Left Heart Cath and Cors/Grafts Angiography;  Surgeon: Wellington Hampshire, MD;  Location: [place] CV LAB;  Service: Cardiovascular;  Laterality: Left;  . CERVICAL FUSION    . CHOLECYSTECTOMY    . COLON SURGERY  01/2009   BOWEL RESECTION DUE TO SMALL BOWEL OBSTRUCTION  . COLONOSCOPY    . COLONOSCOPY WITH PROPOFOL N/A 10/28/2017   Procedure: COLONOSCOPY WITH PROPOFOL;  Surgeon: Lollie Sails, MD;  Location: [facility] ENDOSCOPY;  Service: Endoscopy;  Laterality: N/A;  . CORONARY ANGIOPLASTY  2006  . CORONARY ARTERY BYPASS GRAFT  1993/01/2006   redo at [facility]. LIMA to LAD, SVG to OM2 and SVG to RPDA  . CORONARY STENT INTERVENTION N/A 06/20/2020   Procedure: CORONARY STENT INTERVENTION;  Surgeon: Nelva Bush, MD;  Location: [place] CV LAB;  Service: Cardiovascular;  Laterality: N/A;  . CYSTOSCOPY WITH STENT PLACEMENT Bilateral 09/24/2015   Procedure: CYSTOSCOPY WITH BILATERAL RETROGRADES, BILATERAL STENT PLACEMENT;  Surgeon: Festus Aloe, MD;  Location: [facility] ORS;  Service: Urology;  Laterality: Bilateral;  . ESOPHAGOGASTRODUODENOSCOPY N/A 10/25/2014   Procedure: ESOPHAGOGASTRODUODENOSCOPY (EGD);  Surgeon: Lollie Sails, MD;  Location: [facility] ENDOSCOPY;  Service: Endoscopy;  Laterality: N/A;  . ESOPHAGOGASTRODUODENOSCOPY (EGD) WITH PROPOFOL N/A 02/03/2017   Procedure: ESOPHAGOGASTRODUODENOSCOPY (EGD) WITH PROPOFOL;  Surgeon: Lollie Sails, MD;  Location:  [facility] ENDOSCOPY;  Service: Endoscopy;  Laterality: N/A;  . ESOPHAGOGASTRODUODENOSCOPY (EGD) WITH PROPOFOL N/A 04/18/2017   Procedure: ESOPHAGOGASTRODUODENOSCOPY (EGD) WITH PROPOFOL;  Surgeon: Lollie Sails, MD;  Location: [facility] ENDOSCOPY;  Service: Endoscopy;  Laterality: N/A;  . EYE SURGERY Bilateral    Cataract Extraction with IOL  . HERNIA REPAIR    . INGUINAL HERNIA REPAIR Bilateral 01/23/2016   Procedure: HERNIA REPAIR INGUINAL ADULT BILATERAL;  Surgeon: Leonie Green, MD;  Location: [facility] ORS;  Service: General;  Laterality: Bilateral;  . LEFT HEART CATH AND CORS/GRAFTS ANGIOGRAPHY N/A 12/13/2019   Procedure: LEFT HEART CATH AND CORS/GRAFTS ANGIOGRAPHY;  Surgeon: Wellington Hampshire, MD;  Location: [place]  CV LAB;  Service: Cardiovascular;  Laterality: N/A;  . LEFT HEART CATH AND CORS/GRAFTS ANGIOGRAPHY N/A 06/20/2020   Procedure: LEFT HEART CATH AND CORS/GRAFTS ANGIOGRAPHY;  Surgeon: Nelva Bush, MD;  Location: [place] CV LAB;  Service: Cardiovascular;  Laterality: N/A;  . LUMBAR LAMINECTOMY/DECOMPRESSION MICRODISCECTOMY Left 10/01/2016   Procedure: Left Lumbar four-five Laminotomy for resection of synovial cyst;  Surgeon: Erline Levine, MD;  Location: [place];  Service: Neurosurgery;  Laterality: Left;  left  . NECK SURGERY  06/2009  . SHOULDER SURGERY  2010    Current Medications: Current Meds  Medication Sig  . albuterol (PROVENTIL HFA;VENTOLIN HFA) 108 (90 Base) MCG/ACT inhaler Inhale into the lungs every 4 (four) hours as needed for wheezing or shortness of breath.  [name] aspirin EC 81 MG EC tablet Take 1 tablet (81 mg total) by mouth daily. Swallow whole.  . carvedilol (COREG) 3.125 MG tablet Take 1 tablet (3.125 mg total) by mouth 2 (two) times daily with a meal.  . citalopram (CELEXA) 20 MG tablet Take 20 mg by mouth daily.   [name] EPINEPHrine 0.3 mg/0.3 mL IJ SOAJ injection Inject 0.3 mg into the muscle as needed.   Eduard Roux 140 MG/ML SOAJ Inject 140 mg into  the skin every 28 (twenty-eight) days.  . finasteride (PROSCAR) 5 MG tablet Take 5 mg by mouth daily.  . furosemide (LASIX) 40 MG tablet Take 40 mg by mouth daily.  [name] gabapentin (NEURONTIN) 800 MG tablet Take 1 tablet by mouth 3 (three) times daily.  . isosorbide mononitrate (IMDUR) 30 MG 24 hr tablet Take 1 tablet (30 mg total) by mouth daily.  [name] losartan (COZAAR) 25 MG tablet TAKE 1 TABLET EVERY DAY  . nitroGLYCERIN (NITROSTAT) 0.4 MG SL tablet Place 1 tablet (0.4 mg total) under the tongue every 5 (five) minutes as needed for chest pain.  [name] ondansetron (ZOFRAN) 4 MG tablet Take 4 mg by mouth every 8 (eight) hours as needed for nausea or vomiting.  . pantoprazole (PROTONIX) 40 MG tablet Take 40 mg by mouth 2 (two) times daily.   [name] Peppermint Oil (IBGARD PO) Take by mouth daily at 2 am.  . potassium chloride (KLOR-CON) 10 MEQ tablet Take 1 tablet (10 mEq total) by mouth daily.  . Probiotic Product (PROBIOTIC PO) Take by mouth daily at 8 pm.  . rOPINIRole (REQUIP) 1 MG tablet Take 1 mg by mouth 2 (two) times daily.  . rosuvastatin (CRESTOR) 40 MG tablet Take 1 tablet (40 mg total) by mouth daily.  [name] spironolactone (ALDACTONE) 25 MG tablet Take 1 tablet (25 mg total) by mouth daily.  . ticagrelor (BRILINTA) 90 MG TABS tablet Take 90 mg by mouth 2 (two) times daily.  [name] VASCEPA 1 g capsule TAKE 2 CAPSULES TWICE DAILY  . vitamin B-12 (CYANOCOBALAMIN) 1000 MCG tablet Take 1,000 mcg by mouth daily.  . [DISCONTINUED] spironolactone (ALDACTONE) 25 MG tablet Take 0.5 tablets (12.5 mg total) by mouth daily.     Allergies:   Amitriptyline, Cyclobenzaprine, Dicyclomine, Sacubitril-valsartan, Acetaminophen, Amitriptyline hcl, Bacon flavor, Hydrocodone, Fish oil, Lidocaine, Omega-3 fatty acids-vitamin e, Other, Oxycodone-acetaminophen, and Sulfa antibiotics   Social History   Socioeconomic History  . Marital status: Married    Spouse name: Not on file  . Number of children: Not on file  . Years of  education: Not on file  . Highest education level: Not on file  Occupational History  . Not on file  Tobacco Use  . Smoking status: Former Smoker  Packs/day: 2.50    Types: Cigarettes    Quit date: 08/21/1991    Years since quitting: 29.0  . Smokeless tobacco: Never Used  Vaping Use  . Vaping Use: Never used  Substance and Sexual Activity  . Alcohol use: Not Currently  . Drug use: No  . Sexual activity: Not on file  Other Topics Concern  . Not on file  Social History Narrative  . Not on file   Social Determinants of Health   Financial Resource Strain: Not on file  Food Insecurity: Not on file  Transportation Needs: Not on file  Physical Activity: Not on file  Stress: Not on file  Social Connections: Not on file     Family History: The patient's *family history includes Heart attack in his father; Heart disease (age of onset: 37) in his father.  ROS:   Please see the history of present illness.     All other systems reviewed and are negative.  EKGs/Labs/Other Studies Reviewed:    The following studies were reviewed today: Echo 06/20/2020 1. Left ventricular ejection fraction, by estimation, is 35 to 40%. The  left ventricle has moderately decreased function. The left ventricle  demonstrates regional wall motion abnormalities (see scoring  diagram/findings for description). Left ventricular  diastolic parameters are consistent with Grade I diastolic dysfunction  (impaired relaxation). There is severe hypokinesis of the left  ventricular, entire inferolateral wall.  2. Right ventricular systolic function is low normal. The right  ventricular size is normal.  3. The mitral valve is normal in structure. No evidence of mitral valve  regurgitation.  4. The aortic valve is tricuspid. Aortic valve regurgitation is mild.  5. The inferior vena cava is normal in size with greater than 50%  respiratory variability, suggesting right atrial pressure of 3 mmHg.    Cardiac cath 06/20/20 Conclusions: 1. Severe native CAD, including chronic total occlusions of the ostial LAD and RCA. 2. Patent LCx stent with mild to moderate in-stent restenosis. 3. Widely patent LIMA-LAD. 4. Patent SVG-OM2 with 60-70% ostial/proximal graft stenosis. 5. Subtotally occluded SVG-RCA with long segment of proximal graft disease of up to 80% and 99% stenosis at the distal anastomosis with TIMI-1 flow. 6. Moderately reduced left ventricular contraction with inferior akinesis. Normal left ventricular filling pressure. 7. Successful PCI to SVG-RCA with overlapping Resolute Onyx 4.0 x 26 mm and 4.0 x 38 mm proximal and 2.5 x 18 mm distal (extending into rPDA) drug-eluting stents.  Recommendations: 1. Continue tirofiban infusion x 6 hours. 2. Dual antiplatelet therapy with aspirin and tigagrelor for at least 12 months. 3. Aggressive secondary prevention. 4. If patient has recurrent chest pain, consider PCI to SVG-OM2. 5. Follow-up echocardiogram.  Nelva Bush, MD [facility]   Coronary Diagrams   Diagnostic Dominance: Right    Intervention        EKG:  EKG is  ordered today.  The ekg ordered today demonstrates SR, PVCs, 68bpm, nonspecific ST/T wave changes, no sig change  Recent Labs: 12/12/2019: Magnesium 2.7 06/20/2020: ALT 33 08/07/2020: NT-Pro BNP 242; TSH 1.960 08/11/2020: BUN 11; Creatinine, Ser 1.09; Hemoglobin 14.0; Platelets 175; Potassium 3.7; Sodium 138  Recent Lipid Panel    Component Value Date/Time   CHOL 165 02/07/2020 0658   TRIG 317 (H) 02/07/2020 0658   HDL 41 02/07/2020 0658   CHOLHDL 4.0 02/07/2020 0658   VLDL 63 (H) 02/07/2020 0658   LDLCALC 61 02/07/2020 0658     Physical Exam:    VS:  BP  98/64 (BP Location: Left Arm, Patient Position: Sitting, Cuff Size: Normal)   Pulse 68   Ht 5\' 9"  (1.753 m)   Wt 201 lb 8 oz (91.4 kg)   SpO2 96%   BMI 29.76 kg/m     Wt Readings from Last 3 Encounters:  08/25/20 201  lb 8 oz (91.4 kg)  08/16/20 201 lb (91.2 kg)  08/11/20 198 lb (89.8 kg)     GEN:  Well nourished, well developed in no acute distress HEENT: Normal NECK: No JVD; No carotid bruits LYMPHATICS: No lymphadenopathy CARDIAC: RRR, no murmurs, rubs, gallops RESPIRATORY:  Diminished at bases ABDOMEN: Soft, non-tender, non-distended MUSCULOSKELETAL:  1-2+ edema; No deformity  SKIN: Warm and dry NEUROLOGIC:  Alert and oriented x 3 PSYCHIATRIC:  Normal affect   ASSESSMENT:    1. Chronic systolic heart failure ([place])   2. Essential hypertension   3. Coronary artery disease of native artery of native heart with stable angina pectoris (HCC)   4. Chest tightness   5. Shortness of breath    PLAN:    In order of problems listed above:  CAD status post CABG with recent PCI, SVG to RCA 06/20/2020 Still having chest tightness and SOB, unsure if it is extra volume or anginal equivalent. He said  MI symptoms were much worse. I will increase lasix for 3 days, increase spiro as well. Might need R/L heart cath if symptoms do not improve.   Chest tightness and shortness of breath HFrEF 35-40%, g1DD Ischemic cardiomyopathy Weight is the same as the last visit, 201lbs however RedsVest improved at 29%. Still has lower edema on exam. Also reports unchanged orthopnea and DOE. I will increase spironolactone to 25mg  daily. I will increase lasix to 80mg  daily and then back down to 40mg  daily. Continue with daily weights. We will see patient back in a week. BMET and BNP today.   Hypertension BP borderline today, this AM was better 120/70. Continue current medications.   OSA Continue CPAP  Pre-operative cardiac evaluation for dental cleaning Patient to undergo dental cleaning. He reports he will not be under anesthesia. Does not need to hold any medications prior to. Okay to proceed with dental procedure without further cardiac work-up.   Disposition: Follow up in 1 week(s) with APP/MD   Signed, Randilyn Foisy Ninfa Meeker, PA-C  08/25/2020 2:48 PM    [facility]

## 2020-08-25 ENCOUNTER — Encounter: Payer: Self-pay | Admitting: Medical

## 2020-08-25 ENCOUNTER — Ambulatory Visit: Payer: Medicare HMO | Admitting: Medical

## 2020-08-25 ENCOUNTER — Other Ambulatory Visit: Payer: Self-pay

## 2020-08-25 ENCOUNTER — Encounter: Payer: Medicare HMO | Admitting: *Deleted

## 2020-08-25 VITALS — BP 98/64 | HR 68 | Ht 69.0 in | Wt 201.5 lb

## 2020-08-25 DIAGNOSIS — I5022 Chronic systolic (congestive) heart failure: Secondary | ICD-10-CM | POA: Diagnosis not present

## 2020-08-25 DIAGNOSIS — R0602 Shortness of breath: Secondary | ICD-10-CM

## 2020-08-25 DIAGNOSIS — Z955 Presence of coronary angioplasty implant and graft: Secondary | ICD-10-CM

## 2020-08-25 DIAGNOSIS — I10 Essential (primary) hypertension: Secondary | ICD-10-CM | POA: Diagnosis not present

## 2020-08-25 DIAGNOSIS — R0789 Other chest pain: Secondary | ICD-10-CM | POA: Diagnosis not present

## 2020-08-25 DIAGNOSIS — I25118 Atherosclerotic heart disease of native coronary artery with other forms of angina pectoris: Secondary | ICD-10-CM | POA: Diagnosis not present

## 2020-08-25 DIAGNOSIS — I214 Non-ST elevation (NSTEMI) myocardial infarction: Secondary | ICD-10-CM | POA: Diagnosis not present

## 2020-08-25 MED ORDER — SPIRONOLACTONE 25 MG PO TABS: 25.0000 mg | ORAL_TABLET | Freq: Every day | ORAL | 0 refills | Status: DC

## 2020-08-25 NOTE — Progress Notes (Signed)
Daily Session Note  Patient Details  Name: Willie Clark MRN: [id number] Date of Birth: 06-19-1950 Referring Provider:   Flowsheet Row Cardiac Rehab from 07/12/2020 in [facility] Cardiac and Pulmonary Rehab  Referring Provider End, Harrell Gave MD  [facility] Cardiologist: Dr. Rogue Jury Arida]      Encounter Date: 08/25/2020  Check In:      Social History   Tobacco Use  Smoking Status Former Smoker  . Packs/day: 2.50  . Types: Cigarettes  . Quit date: 08/21/1991  . Years since quitting: 29.0  Smokeless Tobacco Never Used    Goals Met:  Independence with exercise equipment Exercise tolerated well No report of cardiac concerns or symptoms  Goals Unmet:  Not Applicable  Comments: Pt able to follow exercise prescription today without complaint.  Will continue to monitor for progression.    Dr. Emily Filbert is Medical Director for [place] and [facility].

## 2020-08-25 NOTE — Patient Instructions (Signed)
Medication Instructions:  Your physician has recommended you make the following change in your medication:  INCREASE SPIRONOLACTONE to 25MG  DAILY INCREASE FUROSEMIDE (LASIX) TO 80MG  DAILY FOR THREE DAYS THEN REDUCE TO 40MG  DAILY  *If you need a refill on your cardiac medications before your next appointment, please call your pharmacy*   Lab Work: BNP and BMP ordered today  If you have labs (blood work) drawn today and your tests are completely normal, you will receive your results only by: [name] MyChart Message (if you have MyChart) OR . A paper copy in the mail If you have any lab test that is abnormal or we need to change your treatment, we will call you to review the results.   Testing/Procedures: None ordered   Follow-Up: At [facility], you and your health needs are our priority.  As part of our continuing mission to provide you with exceptional heart care, we have created designated Provider Care Teams.  These Care Teams include your primary Cardiologist (physician) and Advanced Practice Providers (APPs -  Physician Assistants and Nurse Practitioners) who all work together to provide you with the care you need, when you need it.  We recommend signing up for the patient portal called "MyChart".  Sign up information is provided on this After Visit Summary.  MyChart is used to connect with patients for Virtual Visits (Telemedicine).  Patients are able to view lab/test results, encounter notes, upcoming appointments, etc.  Non-urgent messages can be sent to your provider as well.   To learn more about what you can do with MyChart, go to [email or web address].    Your next appointment:   1 week(s)  The format for your next appointment:   In Person  Provider:   You may see [name], MD or one of the following Advanced Practice Providers on your designated Care Team:    Murray Hodgkins, NP  Christell Faith, PA-C  Marrianne Mood, PA-C  Cadence Arvin, [place]  Laurann Montana, NP

## 2020-08-27 LAB — BASIC METABOLIC PANEL
BUN/Creatinine Ratio: 9 — ABNORMAL LOW (ref 10–24)
BUN: 8 mg/dL (ref 8–27)
CO2: 24 mmol/L (ref 20–29)
Calcium: 9.1 mg/dL (ref 8.6–10.2)
Chloride: 100 mmol/L (ref 96–106)
Creatinine, Ser: 0.91 mg/dL (ref 0.76–1.27)
Glucose: 125 mg/dL — ABNORMAL HIGH (ref 65–99)
Potassium: 3.8 mmol/L (ref 3.5–5.2)
Sodium: 144 mmol/L (ref 134–144)
eGFR: 91 mL/min/{1.73_m2} (ref >59)

## 2020-08-27 LAB — BRAIN NATRIURETIC PEPTIDE: BNP: 39.5 pg/mL (ref 0.0–100.0)

## 2020-08-28 ENCOUNTER — Encounter: Payer: Medicare HMO | Admitting: *Deleted

## 2020-08-28 ENCOUNTER — Other Ambulatory Visit: Payer: Self-pay

## 2020-08-28 DIAGNOSIS — I214 Non-ST elevation (NSTEMI) myocardial infarction: Secondary | ICD-10-CM | POA: Diagnosis not present

## 2020-08-28 DIAGNOSIS — Z955 Presence of coronary angioplasty implant and graft: Secondary | ICD-10-CM

## 2020-08-28 NOTE — Progress Notes (Signed)
Daily Session Note  Patient Details  Name: Willie Clark MRN: [id number] Date of Birth: [birth year] Referring Provider:   Flowsheet Row Cardiac Rehab from 07/12/2020 in [facility] Cardiac and Pulmonary Rehab  Referring Provider End, Harrell Gave MD  [facility] Cardiologist: Dr. Rogue Jury Arida]      Encounter Date: 08/28/2020  Check In:  Session Check In - 08/28/20 1027      Check-In   Supervising physician immediately available to respond to emergencies See telemetry face sheet for immediately available ER MD    Location [facility]-Cardiac & Pulmonary Rehab    Staff Present Renita Papa, RN BSN;Joseph [address] [place], [place], ACSM CEP, Exercise Physiologist    Virtual Visit No    Medication changes reported     Yes    Comments increased Spiriva to 72    Fall or balance concerns reported    No    Warm-up and Cool-down Performed on first and last piece of equipment    Resistance Training Performed Yes    VAD Patient? No    PAD/SET Patient? No      Pain Assessment   Currently in Pain? No/denies              Social History   Tobacco Use  Smoking Status Former Smoker  . Packs/day: 2.50  . Types: Cigarettes  . Quit date: 08/21/1991  . Years since quitting: 29.0  Smokeless Tobacco Never Used    Goals Met:  Independence with exercise equipment Exercise tolerated well No report of cardiac concerns or symptoms Strength training completed today  Goals Unmet:  Not Applicable  Comments: Pt able to follow exercise prescription today without complaint.  Will continue to monitor for progression.    Dr. Emily Filbert is Medical Director for [place] [facility].

## 2020-08-30 ENCOUNTER — Other Ambulatory Visit: Payer: Self-pay

## 2020-08-30 DIAGNOSIS — I214 Non-ST elevation (NSTEMI) myocardial infarction: Secondary | ICD-10-CM

## 2020-08-30 DIAGNOSIS — Z955 Presence of coronary angioplasty implant and graft: Secondary | ICD-10-CM

## 2020-08-30 NOTE — Progress Notes (Signed)
Daily Session Note  Patient Details  Name: Willie Clark MRN: [id number] Date of Birth: Mar 06, 1951 Referring Provider:   Flowsheet Row Cardiac Rehab from 07/12/2020 in [facility] Cardiac and Pulmonary Rehab  Referring Provider End, Harrell Gave MD  [facility] Cardiologist: Dr. Rogue Jury Arida]      Encounter Date: 08/30/2020  Check In:  Session Check In - 08/30/20 0944      Check-In   Supervising physician immediately available to respond to emergencies See telemetry face sheet for immediately available ER MD    Location [facility]-Cardiac & Pulmonary Rehab    Staff Present Birdie Sons, MPA, RN;Amanda Oletta Darter, BA, ACSM CEP, Exercise Physiologist;Joseph Tessie Fass RCP,RRT,BSRT    Virtual Visit No    Medication changes reported     No    Fall or balance concerns reported    No    Warm-up and Cool-down Performed on first and last piece of equipment    Resistance Training Performed Yes    VAD Patient? No    PAD/SET Patient? No      Pain Assessment   Currently in Pain? No/denies              Social History   Tobacco Use  Smoking Status Former Smoker  . Packs/day: 2.50  . Types: Cigarettes  . Quit date: 08/21/1991  . Years since quitting: 29.0  Smokeless Tobacco Never Used    Goals Met:  Independence with exercise equipment Exercise tolerated well No report of cardiac concerns or symptoms Strength training completed today  Goals Unmet:  Not Applicable  Comments: Pt able to follow exercise prescription today without complaint.  Will continue to monitor for progression.    Dr. Emily Filbert is Medical Director for [place] and [facility].

## 2020-08-31 ENCOUNTER — Ambulatory Visit
Admission: RE | Admit: 2020-08-31 | Discharge: 2020-08-31 | Disposition: A | Discharge status: Home / Self Care | Payer: Medicare HMO | Source: Ambulatory Visit | Attending: Gastroenterology | Admitting: Gastroenterology

## 2020-08-31 DIAGNOSIS — I700 Atherosclerosis of aorta: Secondary | ICD-10-CM | POA: Insufficient documentation

## 2020-08-31 DIAGNOSIS — Z8719 Personal history of other diseases of the digestive system: Secondary | ICD-10-CM | POA: Diagnosis present

## 2020-08-31 DIAGNOSIS — K760 Fatty (change of) liver, not elsewhere classified: Secondary | ICD-10-CM | POA: Diagnosis not present

## 2020-08-31 MED ORDER — IOHEXOL 300 MG/ML  SOLN
100.0000 mL | Freq: Once | INTRAMUSCULAR | Status: AC | PRN
  Administered 2020-08-31: 100 mL via INTRAVENOUS

## 2020-09-01 ENCOUNTER — Other Ambulatory Visit: Payer: Self-pay

## 2020-09-01 ENCOUNTER — Encounter: Payer: Medicare HMO | Admitting: *Deleted

## 2020-09-01 ENCOUNTER — Encounter: Payer: Self-pay | Admitting: Cardiovascular Disease

## 2020-09-01 ENCOUNTER — Ambulatory Visit: Payer: Medicare HMO | Admitting: Cardiovascular Disease

## 2020-09-01 VITALS — BP 100/68 | HR 64 | Ht 69.0 in | Wt 202.5 lb

## 2020-09-01 DIAGNOSIS — I5022 Chronic systolic (congestive) heart failure: Secondary | ICD-10-CM | POA: Diagnosis not present

## 2020-09-01 DIAGNOSIS — I10 Essential (primary) hypertension: Secondary | ICD-10-CM

## 2020-09-01 DIAGNOSIS — I714 Abdominal aortic aneurysm, without rupture, unspecified: Secondary | ICD-10-CM

## 2020-09-01 DIAGNOSIS — I214 Non-ST elevation (NSTEMI) myocardial infarction: Secondary | ICD-10-CM

## 2020-09-01 DIAGNOSIS — I2510 Atherosclerotic heart disease of native coronary artery without angina pectoris: Secondary | ICD-10-CM

## 2020-09-01 DIAGNOSIS — E785 Hyperlipidemia, unspecified: Secondary | ICD-10-CM

## 2020-09-01 DIAGNOSIS — I779 Disorder of arteries and arterioles, unspecified: Secondary | ICD-10-CM

## 2020-09-01 DIAGNOSIS — Z955 Presence of coronary angioplasty implant and graft: Secondary | ICD-10-CM

## 2020-09-01 NOTE — Progress Notes (Signed)
Cardiology Office Note   Date:  09/01/2020   ID:  Willie Clark, DOB 09-04-1950, MRN [id number]  PCP:  Willie Sia, MD  Cardiologist:   Willie Sacramento, MD   Chief Complaint  Patient presents with  . Other    1 wk f/u c/o sob and edema ankles. Meds reviewed verbally with pt.      History of Present Illness: Willie Clark is a 70 y.o. male who presents for regarding coronary artery disease and chronic systolic heart failure.  He has known history of coronary artery disease. He is status post coronary artery bypass graft surgery in 1993 and redo in 2007.   He suffers from fibromyalgia and chronic pain.  He had GI symptoms in 2015 due to documented gastric ulcer. He had neck surgery in [id number] without complications. He has known history of chronic neck pain with 2 previous cervical spine surgeries.    He was hospitalized in August of 2021with chest pain.  Echocardiogram showed an EF of 40 to 45%.  Cardiac catheterization showed severe underlying three-vessel coronary arteries with patent grafts and no significant change since 2017.  The patient antihypertensive medications were switched to losartan and carvedilol.  Rosuvastatin was increased to 40 mg daily. Losartan was subsequently switched to [facility].  However, the patient had allergic reaction with rash and itching that had to be treated in the emergency room.  Spironolactone was subsequently added.  He was seen on March in the office with symptoms of unstable angina.  His EKG was worrisome for inferior ST elevation myocardial infarction and thus he was transferred emergently to the Cath Lab where he underwent cardiac catheterization which showed severe native three-vessel coronary artery disease with patent left circumflex stent with mild to moderate in-stent restenosis, widely patent LIMA to LAD, patent SVG to OM 2 with 60 to 70% proximal graft stenosis and subtotally occluded SVG to RCA with long segment of proximal graft disease  and 99% stenosis at the distal anastomosis.  He underwent successful PCI to SVG to RCA with overlapping resolute Onyx drug-eluting stents. Echocardiogram during that admission showed an EF of 35 to 40%.  The patient has been doing reasonably well with stable exertional dyspnea.  In addition, he has bilateral leg edema which seems to be worse.  Chest pain is stable.   Past Medical History:  Diagnosis Date  . Anxiety   . Arthritis   . Barrett's esophagus   . Bowel obstruction ([place]) 02/2009   small bowel  . Chronic chest pain   . Coronary artery disease    a. 1993 CABG; b. 2007 Redo CABG; c. 09/2015 Cath: LM mild dzs, LAD 100ost, RI mild dzs, LCX 60p, OM1 100, RCA 100ost, VG->OM2 min irregs, LIMA->LAD nl, VG->RPDA 30ost, 20d, nl EF->Med rx.  . Depression   . Diverticulitis   . Diverticulosis   . Erectile dysfunction   . Fibromyalgia   . Gastric ulcer   . Gastritis   . GERD (gastroesophageal reflux disease)   . Headache   . Hemorrhoids   . HFrEF (heart failure with reduced ejection fraction) ([place])    a. 09/2016 Echo: EF 55-60%, no rwma, triv AI, mild MR, mildly dil LA. -> as of April 2020: EF 40 to 45%.  Septal dyssynergy/hypokinesis due to postop state but otherwise unable to assess wall motion Willie Clark due to poor study.  . History of myocardial infarct at age less than 82 years   . Hyperlipidemia   . Hypertension   .  Kidney stones    going to [facility]  June 26 to see kidney specialist  . Lightheadedness   . Mass on back   . Nephrolithiasis   . Orthostatic hypotension    a. Improved after discontinuation of metoprolol.  . OSA (obstructive sleep apnea)    a. On CPAP.  [name] Pulmonary embolism ([place]) 01/2006   Post-op/treated  . Skin lesions, generalized   . Stroke [facility]) 1993   right side weakness no blood thinners   on Aspirin  . Syncope and collapse   . Urticaria     Past Surgical History:  Procedure Laterality Date  . BACK SURGERY    . bowel obstruction  01/2009  .  CARDIAC CATHETERIZATION  11/2006   patent grafts. Significant OM3 disease and occluded diagonals.  [name] CARDIAC CATHETERIZATION  06/2010   patent grafts. significant ISR in proximal LCX but OM2 is bypassed and gives retrograde flow to OM3.   [name] CARDIAC CATHETERIZATION  06/2013   [facility]: Patent grafts with 60% proximal in-stent restenosis in the left circumflex with FFR of 0.85  . CARDIAC CATHETERIZATION Left 09/25/2015   Procedure: Left Heart Cath and Cors/Grafts Angiography;  Surgeon: Willie Hampshire, MD;  Location: [place] CV LAB;  Service: Cardiovascular;  Laterality: Left;  . CERVICAL FUSION    . CHOLECYSTECTOMY    . COLON SURGERY  01/2009   BOWEL RESECTION DUE TO SMALL BOWEL OBSTRUCTION  . COLONOSCOPY    . COLONOSCOPY WITH PROPOFOL N/A 10/28/2017   Procedure: COLONOSCOPY WITH PROPOFOL;  Surgeon: Willie Sails, MD;  Location: [facility] ENDOSCOPY;  Service: Endoscopy;  Laterality: N/A;  . CORONARY ANGIOPLASTY  2006  . CORONARY ARTERY BYPASS GRAFT  1993/01/2006   redo at [facility]. LIMA to LAD, SVG to OM2 and SVG to RPDA  . CORONARY STENT INTERVENTION N/A 06/20/2020   Procedure: CORONARY STENT INTERVENTION;  Surgeon: Willie Bush, MD;  Location: [place] CV LAB;  Service: Cardiovascular;  Laterality: N/A;  . CYSTOSCOPY WITH STENT PLACEMENT Bilateral 09/24/2015   Procedure: CYSTOSCOPY WITH BILATERAL RETROGRADES, BILATERAL STENT PLACEMENT;  Surgeon: Willie Aloe, MD;  Location: [facility] ORS;  Service: Urology;  Laterality: Bilateral;  . ESOPHAGOGASTRODUODENOSCOPY N/A 10/25/2014   Procedure: ESOPHAGOGASTRODUODENOSCOPY (EGD);  Surgeon: Willie Sails, MD;  Location: [facility] ENDOSCOPY;  Service: Endoscopy;  Laterality: N/A;  . ESOPHAGOGASTRODUODENOSCOPY (EGD) WITH PROPOFOL N/A 02/03/2017   Procedure: ESOPHAGOGASTRODUODENOSCOPY (EGD) WITH PROPOFOL;  Surgeon: Willie Sails, MD;  Location: [facility] ENDOSCOPY;  Service: Endoscopy;  Laterality: N/A;  . ESOPHAGOGASTRODUODENOSCOPY (EGD) WITH PROPOFOL N/A  04/18/2017   Procedure: ESOPHAGOGASTRODUODENOSCOPY (EGD) WITH PROPOFOL;  Surgeon: Willie Sails, MD;  Location: [facility] ENDOSCOPY;  Service: Endoscopy;  Laterality: N/A;  . EYE SURGERY Bilateral    Cataract Extraction with IOL  . HERNIA REPAIR    . INGUINAL HERNIA REPAIR Bilateral 01/23/2016   Procedure: HERNIA REPAIR INGUINAL ADULT BILATERAL;  Surgeon: Leonie Green, MD;  Location: [facility] ORS;  Service: General;  Laterality: Bilateral;  . LEFT HEART CATH AND CORS/GRAFTS ANGIOGRAPHY N/A 12/13/2019   Procedure: LEFT HEART CATH AND CORS/GRAFTS ANGIOGRAPHY;  Surgeon: Willie Hampshire, MD;  Location: [place] CV LAB;  Service: Cardiovascular;  Laterality: N/A;  . LEFT HEART CATH AND CORS/GRAFTS ANGIOGRAPHY N/A 06/20/2020   Procedure: LEFT HEART CATH AND CORS/GRAFTS ANGIOGRAPHY;  Surgeon: Willie Bush, MD;  Location: [place] CV LAB;  Service: Cardiovascular;  Laterality: N/A;  . LUMBAR LAMINECTOMY/DECOMPRESSION MICRODISCECTOMY Left 10/01/2016   Procedure: Left Lumbar four-five Laminotomy for resection of synovial  cyst;  Surgeon: Erline Levine, MD;  Location: [place];  Service: Neurosurgery;  Laterality: Left;  left  . NECK SURGERY  06/2009  . SHOULDER SURGERY  2010     Current Outpatient Medications  Medication Sig Dispense Refill  . albuterol (PROVENTIL HFA;VENTOLIN HFA) 108 (90 Base) MCG/ACT inhaler Inhale into the lungs every 4 (four) hours as needed for wheezing or shortness of breath.    [name] aspirin EC 81 MG EC tablet Take 1 tablet (81 mg total) by mouth daily. Swallow whole. 30 tablet 11  . carvedilol (COREG) 3.125 MG tablet Take 1 tablet (3.125 mg total) by mouth 2 (two) times daily with a meal. 180 tablet 0  . citalopram (CELEXA) 20 MG tablet Take 20 mg by mouth daily.     [name] EPINEPHrine 0.3 mg/0.3 mL IJ SOAJ injection Inject 0.3 mg into the muscle as needed.     Eduard Roux 140 MG/ML SOAJ Inject 140 mg into the skin every 28 (twenty-eight) days.    . finasteride (PROSCAR)  5 MG tablet Take 5 mg by mouth daily.    . furosemide (LASIX) 40 MG tablet Take 40 mg by mouth daily.    [name] gabapentin (NEURONTIN) 800 MG tablet Take 1 tablet by mouth 3 (three) times daily.    . isosorbide mononitrate (IMDUR) 30 MG 24 hr tablet Take 1 tablet (30 mg total) by mouth daily. 30 tablet 3  . losartan (COZAAR) 25 MG tablet TAKE 1 TABLET EVERY DAY 90 tablet 0  . nitroGLYCERIN (NITROSTAT) 0.4 MG SL tablet Place 1 tablet (0.4 mg total) under the tongue every 5 (five) minutes as needed for chest pain. 90 tablet 1  . ondansetron (ZOFRAN) 4 MG tablet Take 4 mg by mouth every 8 (eight) hours as needed for nausea or vomiting.    . pantoprazole (PROTONIX) 40 MG tablet Take 40 mg by mouth 2 (two) times daily.     [name] Peppermint Oil (IBGARD PO) Take by mouth daily at 2 am.    . potassium chloride (KLOR-CON) 10 MEQ tablet Take 1 tablet (10 mEq total) by mouth daily. 90 tablet 3  . Probiotic Product (PROBIOTIC PO) Take by mouth daily at 8 pm.    . rOPINIRole (REQUIP) 1 MG tablet Take 1 mg by mouth 2 (two) times daily.    . rosuvastatin (CRESTOR) 40 MG tablet Take 1 tablet (40 mg total) by mouth daily. 90 tablet 1  . spironolactone (ALDACTONE) 25 MG tablet Take 1 tablet (25 mg total) by mouth daily. 90 tablet 0  . ticagrelor (BRILINTA) 90 MG TABS tablet Take 90 mg by mouth 2 (two) times daily.    [name] VASCEPA 1 g capsule TAKE 2 CAPSULES TWICE DAILY 360 capsule 1  . vitamin B-12 (CYANOCOBALAMIN) 1000 MCG tablet Take 1,000 mcg by mouth daily.     No current facility-administered medications for this visit.    Allergies:   Amitriptyline, Cyclobenzaprine, Dicyclomine, Sacubitril-valsartan, Acetaminophen, Amitriptyline hcl, Bacon flavor, Hydrocodone, Fish oil, Lidocaine, Omega-3 fatty acids-vitamin e, Other, Oxycodone-acetaminophen, and Sulfa antibiotics    Social History:  The patient  reports that he quit smoking about 29 years ago. His smoking use included cigarettes. He smoked 2.50 packs per day. He  has never used smokeless tobacco. He reports previous alcohol use. He reports that he does not use drugs.   Family History:  The patient's family history includes Heart attack in his father; Heart disease (age of onset: 5) in his father.    ROS:  Please see the history of present illness.   Otherwise, review of systems are positive for none.   All other systems are reviewed and negative.    PHYSICAL EXAM: VS:  BP 100/68 (BP Location: Left Arm, Patient Position: Sitting, Cuff Size: Normal)   Pulse 64   Ht 5\' 9"  (1.753 m)   Wt 202 lb 8 oz (91.9 kg)   SpO2 96%   BMI 29.90 kg/m  , BMI Body mass index is 29.9 kg/m. GEN: Well nourished, well developed, in no acute distress  HEENT: normal  Neck: no JVD, carotid bruits, or masses Cardiac: RRR; no murmurs, rubs, or gallops,no edema  Respiratory:  clear to auscultation bilaterally, normal work of breathing GI: soft, nontender, nondistended, + BS MS: no deformity or atrophy  Skin: warm and dry, no rash Neuro:  Strength and sensation are intact Psych: euthymic mood, full affect   EKG:  EKG is  ordered today. Normal sinus rhythm with nonspecific ST changes.   Recent Labs: 12/12/2019: Magnesium 2.7 06/20/2020: ALT 33 08/07/2020: NT-Pro BNP 242; TSH 1.960 08/11/2020: Hemoglobin 14.0; Platelets 175 08/25/2020: BNP 39.5; BUN 8; Creatinine, Ser 0.91; Potassium 3.8; Sodium 144    Lipid Panel    Component Value Date/Time   CHOL 165 02/07/2020 0658   TRIG 317 (H) 02/07/2020 0658   HDL 41 02/07/2020 0658   CHOLHDL 4.0 02/07/2020 0658   VLDL 63 (H) 02/07/2020 0658   LDLCALC 61 02/07/2020 0658      Wt Readings from Last 3 Encounters:  09/01/20 202 lb 8 oz (91.9 kg)  08/25/20 201 lb 8 oz (91.4 kg)  08/16/20 201 lb (91.2 kg)         ASSESSMENT AND PLAN:  1.  Coronary artery disease involving native coronary arteries with other forms of angina: Symptoms are overall stable.  Continue dual antiplatelet therapy with aspirin and  ticagrelor.  He should continue long-term dual antiplatelet therapy given multiple overlapping stents in the SVG to RCA.  Continue cardiac rehab.  2.  Chronic systolic heart failure: EF of 35 to 40%: Continue treatment with carvedilol, losartan and spironolactone.  Not able to increase any dose due to relatively low blood pressure.  I elected to add Farxiga 10 mg daily.  The patient does appear to be mildly volume overloaded but will continue same dose of furosemide given the initiation of Farxiga.  3. Essential hypertension: Blood pressure is controlled.  4. Hyperlipidemia: Continue high-dose rosuvastatin and Vascepa.  Most recent lipid profile showed an LDL of 61 and triglyceride of 317.  5. Mild carotid disease: Previous carotid Doppler showed less than 40% stenosis bilaterally. No need to repeat.  6.  Small abdominal aortic aneurysm: This was noted on ultrasound yesterday measuring 3 cm.  Recommend repeat ultrasound in 2 years .    Disposition: Follow-up in 2 months.   Signed,  Willie Sacramento, MD  09/01/2020 3:44 PM    [facility]

## 2020-09-01 NOTE — Progress Notes (Signed)
Daily Session Note  Patient Details  Name: Willie Clark MRN: [id number] Date of Birth: April 30, 1950 Referring Provider:   Flowsheet Row Cardiac Rehab from 07/12/2020 in [facility] Cardiac and Pulmonary Rehab  Referring Provider End, Harrell Gave MD  [facility] Cardiologist: Dr. Rogue Jury Arida]      Encounter Date: 09/01/2020  Check In:  Session Check In - 09/01/20 1044      Check-In   Supervising physician immediately available to respond to emergencies See telemetry face sheet for immediately available ER MD    Location [facility]-Cardiac & Pulmonary Rehab    Staff Present Hope Budds RDN, LDN;Jessica Luan Pulling, MA, RCEP, CCRP, CCET;Charlayne Vultaggio, RN, BSN, CCRP    Virtual Visit No    Medication changes reported     No    Fall or balance concerns reported    No    Warm-up and Cool-down Performed on first and last piece of equipment    Resistance Training Performed Yes    VAD Patient? No    PAD/SET Patient? No      Pain Assessment   Currently in Pain? No/denies              Social History   Tobacco Use  Smoking Status Former Smoker  . Packs/day: 2.50  . Types: Cigarettes  . Quit date: 08/21/1991  . Years since quitting: 29.0  Smokeless Tobacco Never Used    Goals Met:  Independence with exercise equipment Exercise tolerated well No report of cardiac concerns or symptoms  Goals Unmet:  Not Applicable  Comments: Pt able to follow exercise prescription today without complaint.  Will continue to monitor for progression.    Dr. Emily Filbert is Medical Director for [place] and [facility].

## 2020-09-01 NOTE — Patient Instructions (Signed)
Medication Instructions:  Your physician has recommended you make the following change in your medication:   START Farxiga 10 mg daily. An Rx has been sent to your pharmacy.   *If you need a refill on your cardiac medications before your next appointment, please call your pharmacy*   Lab Work: None ordered If you have labs (blood work) drawn today and your tests are completely normal, you will receive your results only by: [name] MyChart Message (if you have MyChart) OR . A paper copy in the mail If you have any lab test that is abnormal or we need to change your treatment, we will call you to review the results.   Testing/Procedures: None ordered   Follow-Up: At [facility], you and your health needs are our priority.  As part of our continuing mission to provide you with exceptional heart care, we have created designated Provider Care Teams.  These Care Teams include your primary Cardiologist (physician) and Advanced Practice Providers (APPs -  Physician Assistants and Nurse Practitioners) who all work together to provide you with the care you need, when you need it.  We recommend signing up for the patient portal called "MyChart".  Sign up information is provided on this After Visit Summary.  MyChart is used to connect with patients for Virtual Visits (Telemedicine).  Patients are able to view lab/test results, encounter notes, upcoming appointments, etc.  Non-urgent messages can be sent to your provider as well.   To learn more about what you can do with MyChart, go to [email or web address].    Your next appointment:   2 month(s)  The format for your next appointment:   In Person  Provider:   You may see [name], MD or one of the following Advanced Practice Providers on your designated Care Team:    Murray Hodgkins, NP  Christell Faith, PA-C  Marrianne Mood, PA-C  Cadence [place], [place]  Laurann Montana, NP    Other Instructions N/A

## 2020-09-04 ENCOUNTER — Other Ambulatory Visit: Payer: Self-pay

## 2020-09-04 ENCOUNTER — Telehealth: Payer: Self-pay | Admitting: Cardiovascular Disease

## 2020-09-04 ENCOUNTER — Ambulatory Visit: Payer: Medicare HMO

## 2020-09-04 ENCOUNTER — Encounter: Payer: Medicare HMO | Admitting: *Deleted

## 2020-09-04 DIAGNOSIS — I5042 Chronic combined systolic (congestive) and diastolic (congestive) heart failure: Secondary | ICD-10-CM

## 2020-09-04 DIAGNOSIS — I214 Non-ST elevation (NSTEMI) myocardial infarction: Secondary | ICD-10-CM

## 2020-09-04 DIAGNOSIS — Z955 Presence of coronary angioplasty implant and graft: Secondary | ICD-10-CM

## 2020-09-04 DIAGNOSIS — I5020 Unspecified systolic (congestive) heart failure: Secondary | ICD-10-CM

## 2020-09-04 MED ORDER — DAPAGLIFLOZIN PROPANEDIOL 10 MG PO TABS: 10.0000 mg | ORAL_TABLET | Freq: Every day | ORAL | 5 refills | Status: DC

## 2020-09-04 NOTE — Progress Notes (Signed)
Daily Session Note  Patient Details  Name: Willie Clark MRN: [id number] Date of Birth: 1950-05-29 Referring Provider:   Flowsheet Row Cardiac Rehab from 07/12/2020 in [facility] Cardiac and Pulmonary Rehab  Referring Provider End, Harrell Gave MD  [facility] Cardiologist: Dr. Rogue Jury Arida]      Encounter Date: 09/04/2020  Check In:  Session Check In - 09/04/20 1019      Check-In   Supervising physician immediately available to respond to emergencies See telemetry face sheet for immediately available ER MD    Location [facility]-Cardiac & Pulmonary Rehab    Staff Present Renita Papa, RN BSN;Joseph [address] [place], [place], ACSM CEP, Exercise Physiologist    Virtual Visit No    Medication changes reported     No    Fall or balance concerns reported    No    Warm-up and Cool-down Performed on first and last piece of equipment    Resistance Training Performed Yes    VAD Patient? No    PAD/SET Patient? No      Pain Assessment   Currently in Pain? No/denies              Social History   Tobacco Use  Smoking Status Former Smoker  . Packs/day: 2.50  . Types: Cigarettes  . Quit date: 08/21/1991  . Years since quitting: 29.0  Smokeless Tobacco Never Used    Goals Met:  Independence with exercise equipment Exercise tolerated well No report of cardiac concerns or symptoms Strength training completed today  Goals Unmet:  Not Applicable  Comments: Pt able to follow exercise prescription today without complaint.  Will continue to monitor for progression.    Dr. Emily Filbert is Medical Director for [place] and [facility].

## 2020-09-04 NOTE — Telephone Encounter (Signed)
Patient calling in to state [facility] in [place] never received Rx for Willie Clark that patient was prescribed on 5/13  Please advise

## 2020-09-04 NOTE — Telephone Encounter (Signed)
Spoke with the patients wife Thayer Headings. Adv her that the Rx for [name] has been sent to the patients pharmacy today. The pharmacy should contact them when it is available for pick up. Patient is to contact the office of any assistance is needed.

## 2020-09-06 ENCOUNTER — Other Ambulatory Visit: Payer: Self-pay

## 2020-09-06 ENCOUNTER — Encounter: Payer: Self-pay | Admitting: *Deleted

## 2020-09-06 DIAGNOSIS — Z955 Presence of coronary angioplasty implant and graft: Secondary | ICD-10-CM

## 2020-09-06 DIAGNOSIS — I214 Non-ST elevation (NSTEMI) myocardial infarction: Secondary | ICD-10-CM | POA: Diagnosis not present

## 2020-09-06 NOTE — Progress Notes (Signed)
Daily Session Note  Patient Details  Name: BRANTLEY WILEY MRN: [id number] Date of Birth: 07-14-1950 Referring Provider:   Flowsheet Row Cardiac Rehab from 07/12/2020 in [facility] Cardiac and Pulmonary Rehab  Referring Provider End, Harrell Gave MD  [facility] Cardiologist: Dr. Rogue Jury Arida]      Encounter Date: 09/06/2020  Check In:  Session Check In - 09/06/20 0946      Check-In   Supervising physician immediately available to respond to emergencies See telemetry face sheet for immediately available ER MD    Location [facility]-Cardiac & Pulmonary Rehab    Staff Present Birdie Sons, MPA, RN;Amanda Oletta Darter, BA, ACSM CEP, Exercise Physiologist;Joseph Tessie Fass RCP,RRT,BSRT    Virtual Visit No    Medication changes reported     No    Fall or balance concerns reported    No    Warm-up and Cool-down Performed on first and last piece of equipment    Resistance Training Performed Yes    VAD Patient? No    PAD/SET Patient? No      Pain Assessment   Currently in Pain? No/denies              Social History   Tobacco Use  Smoking Status Former Smoker  . Packs/day: 2.50  . Types: Cigarettes  . Quit date: 08/21/1991  . Years since quitting: 29.0  Smokeless Tobacco Never Used    Goals Met:  Independence with exercise equipment Exercise tolerated well No report of cardiac concerns or symptoms Strength training completed today  Goals Unmet:  Not Applicable  Comments: Pt able to follow exercise prescription today without complaint.  Will continue to monitor for progression.    Dr. Emily Filbert is Medical Director for [place] and [facility].

## 2020-09-06 NOTE — Progress Notes (Signed)
Cardiac Individual Treatment Plan  Patient Details  Name: Willie Clark MRN: [id number] Date of Birth: 04/15/1951 Referring Provider:   Flowsheet Row Cardiac Rehab from 07/12/2020 in [facility] Cardiac and Pulmonary Rehab  Referring Provider End, Harrell Gave MD  [facility] Cardiologist: Dr. Rogue Jury Arida]      Initial Encounter Date:  Flowsheet Row Cardiac Rehab from 07/12/2020 in [facility] [address] Cardiac and Pulmonary Rehab  Date 07/12/20      Visit Diagnosis: NSTEMI (non-ST elevated myocardial infarction) [facility])  Status post coronary artery stent placement  Patient's Home Medications on Admission:  Current Outpatient Medications:  .  albuterol (PROVENTIL HFA;VENTOLIN HFA) 108 (90 Base) MCG/ACT inhaler, Inhale into the lungs every 4 (four) hours as needed for wheezing or shortness of breath., Disp: , Rfl:  .  aspirin EC 81 MG EC tablet, Take 1 tablet (81 mg total) by mouth daily. Swallow whole., Disp: 30 tablet, Rfl: 11 .  carvedilol (COREG) 3.125 MG tablet, Take 1 tablet (3.125 mg total) by mouth 2 (two) times daily with a meal., Disp: 180 tablet, Rfl: 0 .  citalopram (CELEXA) 20 MG tablet, Take 20 mg by mouth daily. , Disp: , Rfl:  .  dapagliflozin propanediol (FARXIGA) 10 MG TABS tablet, Take 1 tablet (10 mg total) by mouth daily before breakfast., Disp: 30 tablet, Rfl: 5 .  EPINEPHrine 0.3 mg/0.3 mL IJ SOAJ injection, Inject 0.3 mg into the muscle as needed. , Disp: , Rfl:  .  Erenumab-aooe 140 MG/ML SOAJ, Inject 140 mg into the skin every 28 (twenty-eight) days., Disp: , Rfl:  .  finasteride (PROSCAR) 5 MG tablet, Take 5 mg by mouth daily., Disp: , Rfl:  .  furosemide (LASIX) 40 MG tablet, Take 40 mg by mouth daily., Disp: , Rfl:  .  gabapentin (NEURONTIN) 800 MG tablet, Take 1 tablet by mouth 3 (three) times daily., Disp: , Rfl:  .  isosorbide mononitrate (IMDUR) 30 MG 24 hr tablet, Take 1 tablet (30 mg total) by mouth daily., Disp: 30 tablet, Rfl: 3 .  losartan (COZAAR) 25 MG tablet, TAKE 1  TABLET EVERY DAY, Disp: 90 tablet, Rfl: 0 .  nitroGLYCERIN (NITROSTAT) 0.4 MG SL tablet, Place 1 tablet (0.4 mg total) under the tongue every 5 (five) minutes as needed for chest pain., Disp: 90 tablet, Rfl: 1 .  ondansetron (ZOFRAN) 4 MG tablet, Take 4 mg by mouth every 8 (eight) hours as needed for nausea or vomiting., Disp: , Rfl:  .  pantoprazole (PROTONIX) 40 MG tablet, Take 40 mg by mouth 2 (two) times daily. , Disp: , Rfl:  .  Peppermint Oil (IBGARD PO), Take by mouth daily at 2 am., Disp: , Rfl:  .  potassium chloride (KLOR-CON) 10 MEQ tablet, Take 1 tablet (10 mEq total) by mouth daily., Disp: 90 tablet, Rfl: 3 .  Probiotic Product (PROBIOTIC PO), Take by mouth daily at 8 pm., Disp: , Rfl:  .  rOPINIRole (REQUIP) 1 MG tablet, Take 1 mg by mouth 2 (two) times daily., Disp: , Rfl:  .  rosuvastatin (CRESTOR) 40 MG tablet, Take 1 tablet (40 mg total) by mouth daily., Disp: 90 tablet, Rfl: 1 .  spironolactone (ALDACTONE) 25 MG tablet, Take 1 tablet (25 mg total) by mouth daily., Disp: 90 tablet, Rfl: 0 .  ticagrelor (BRILINTA) 90 MG TABS tablet, Take 90 mg by mouth 2 (two) times daily., Disp: , Rfl:  .  VASCEPA 1 g capsule, TAKE 2 CAPSULES TWICE DAILY, Disp: 360 capsule, Rfl: 1 .  vitamin  B-12 (CYANOCOBALAMIN) 1000 MCG tablet, Take 1,000 mcg by mouth daily., Disp: , Rfl:   Past Medical History: Past Medical History:  Diagnosis Date  . Anxiety   . Arthritis   . Barrett's esophagus   . Bowel obstruction ([place]) 02/2009   small bowel  . Chronic chest pain   . Coronary artery disease    a. 1993 CABG; b. 2007 Redo CABG; c. 09/2015 Cath: LM mild dzs, LAD 100ost, RI mild dzs, LCX 60p, OM1 100, RCA 100ost, VG->OM2 min irregs, LIMA->LAD nl, VG->RPDA 30ost, 20d, nl EF->Med rx.  . Depression   . Diverticulitis   . Diverticulosis   . Erectile dysfunction   . Fibromyalgia   . Gastric ulcer   . Gastritis   . GERD (gastroesophageal reflux disease)   . Headache   . Hemorrhoids   . HFrEF (heart  failure with reduced ejection fraction) ([place])    a. 09/2016 Echo: EF 55-60%, no rwma, triv AI, mild MR, mildly dil LA. -> as of April 2020: EF 40 to 45%.  Septal dyssynergy/hypokinesis due to postop state but otherwise unable to assess wall motion [name] due to poor study.  . History of myocardial infarct at age less than 80 years   . Hyperlipidemia   . Hypertension   . Kidney stones    going to [facility]  June 26 to see kidney specialist  . Lightheadedness   . Mass on back   . Nephrolithiasis   . Orthostatic hypotension    a. Improved after discontinuation of metoprolol.  . OSA (obstructive sleep apnea)    a. On CPAP.  [name] Pulmonary embolism ([place]) 01/2006   Post-op/treated  . Skin lesions, generalized   . Stroke [facility]) 1993   right side weakness no blood thinners   on Aspirin  . Syncope and collapse   . Urticaria     Tobacco Use: Social History   Tobacco Use  Smoking Status Former Smoker  . Packs/day: 2.50  . Types: Cigarettes  . Quit date: 08/21/1991  . Years since quitting: 29.0  Smokeless Tobacco Never Used    Labs: Recent Review Flowsheet Data    Labs for ITP Cardiac and Pulmonary Rehab Latest Ref Rng & Units 09/24/2015 08/01/2018 12/11/2019 12/13/2019 02/07/2020   Cholestrol 0 - 200 mg/dL - 154 - 181 165   LDLCALC 0 - 99 mg/dL - 56 - 84 61   HDL >40 mg/dL - 41 - 35(L) 41   Trlycerides <150 mg/dL - 285(H) - 312(H) 317(H)   Hemoglobin A1c 4.8 - 5.6 % 5.6 - 6.0(H) - -       Exercise Target Goals: Exercise Program Goal: Individual exercise prescription set using results from initial 6 min walk test and THRR while considering  patient's activity barriers and safety.   Exercise Prescription Goal: Initial exercise prescription builds to 30-45 minutes a day of aerobic activity, 2-3 days per week.  Home exercise guidelines will be given to patient during program as part of exercise prescription that the participant will acknowledge.   Education: Aerobic Exercise: - Group  verbal and visual presentation on the components of exercise prescription. Introduces F.I.T.T principle from ACSM for exercise prescriptions.  Reviews F.I.T.T. principles of aerobic exercise including progression. Written material given at graduation. Flowsheet Row Cardiac Rehab from 08/16/2020 in [facility] Cardiac and Pulmonary Rehab  Education need identified 07/12/20  Date 08/09/20  Educator AS  Instruction Review Code 1- Verbalizes Understanding      Education: Resistance Exercise: - Group verbal  and visual presentation on the components of exercise prescription. Introduces F.I.T.T principle from ACSM for exercise prescriptions  Reviews F.I.T.T. principles of resistance exercise including progression. Written material given at graduation. Flowsheet Row Cardiac Rehab from 08/16/2020 in [facility] Cardiac and Pulmonary Rehab  Date 08/16/20  Educator AS  Instruction Review Code 1- Verbalizes Understanding       Education: Exercise & Equipment Safety: - Individual verbal instruction and demonstration of equipment use and safety with use of the equipment. Flowsheet Row Cardiac Rehab from 08/16/2020 in [facility] Cardiac and Pulmonary Rehab  Date 07/12/20  Educator [facility]  Instruction Review Code 1- Verbalizes Understanding      Education: Exercise Physiology & General Exercise Guidelines: - Group verbal and written instruction with models to review the exercise physiology of the cardiovascular system and associated critical values. Provides general exercise guidelines with specific guidelines to those with heart or lung disease.  Flowsheet Row Cardiac Rehab from 08/16/2020 in [facility] Cardiac and Pulmonary Rehab  Date 08/02/20  Educator [facility]  Instruction Review Code 1- Verbalizes Understanding      Education: Flexibility, Balance, Mind/Body Relaxation: - Group verbal and visual presentation with interactive activity on the components of exercise prescription. Introduces F.I.T.T principle from ACSM for exercise  prescriptions. Reviews F.I.T.T. principles of flexibility and balance exercise training including progression. Also discusses the mind body connection.  Reviews various relaxation techniques to help reduce and manage stress (i.e. Deep breathing, progressive muscle relaxation, and visualization). Balance handout provided to take home. Written material given at graduation.   Activity Barriers & Risk Stratification:  Activity Barriers & Cardiac Risk Stratification - 07/12/20 1027      Activity Barriers & Cardiac Risk Stratification   Activity Barriers Arthritis;Back Problems;Neck/Spine Problems;Assistive Device;Deconditioning;Muscular Weakness;Shortness of Breath;Balance Concerns    Cardiac Risk Stratification High           6 Minute Walk:  6 Minute Walk    Row Name 07/12/20 1026         6 Minute Walk   Phase Initial     Distance 1080 feet     Walk Time 6 minutes     # of Rest Breaks 0     MPH 2.04     METS 2.5     RPE 13     Perceived Dyspnea  2     VO2 Peak 8.75     Symptoms Yes (comment)     Comments R leg weakness from CVA, chronic pain 6/10     Resting HR 62 bpm     Resting BP 150/72     Resting Oxygen Saturation  95 %     Exercise Oxygen Saturation  during 6 min walk 98 %     Max Ex. HR 81 bpm     Max Ex. BP 174/72     2 Minute Post BP 134/68            Oxygen Initial Assessment:   Oxygen Re-Evaluation:   Oxygen Discharge (Final Oxygen Re-Evaluation):   Initial Exercise Prescription:  Initial Exercise Prescription - 07/12/20 1000      Date of Initial Exercise RX and Referring Provider   Date 07/12/20    Referring Provider End, Harrell Gave MD   Primary Cardiologist: Dr. [name]     Treadmill   MPH 2    Grade 0    Minutes 15    METs 2.53      REL-XR   Level 1    Speed 50  Minutes 15    METs 2      T5 Nustep   Level 1    SPM 80    Minutes 15    METs 2      Prescription Details   Frequency (times per week) 3    Duration Progress  to 30 minutes of continuous aerobic without signs/symptoms of physical distress      Intensity   THRR 40-80% of Max Heartrate 98-133    Ratings of Perceived Exertion 11-13    Perceived Dyspnea 0-4      Progression   Progression Continue to progress workloads to maintain intensity without signs/symptoms of physical distress.      Resistance Training   Training Prescription Yes    Weight 4 lb    Reps 10-15           Perform Capillary Blood Glucose checks as needed.  Exercise Prescription Changes:  Exercise Prescription Changes    Row Name 07/12/20 1000 07/18/20 1200 07/31/20 1300 08/15/20 1200 08/29/20 1400     Response to Exercise   Blood Pressure (Admit) 150/72 102/60 128/62 102/60 102/60   Blood Pressure (Exercise) 174/72 138/80 126/60 122/62 130/76   Blood Pressure (Exit) 134/68 118/60 124/64 112/62 124/76   Heart Rate (Admit) 62 bpm 68 bpm 64 bpm 63 bpm 67 bpm   Heart Rate (Exercise) 81 bpm 108 bpm 104 bpm 123 bpm 87 bpm   Heart Rate (Exit) 56 bpm 73 bpm 70 bpm 69 bpm 74 bpm   Oxygen Saturation (Admit) 95 % -- -- -- --   Oxygen Saturation (Exercise) 98 % -- -- -- --   Rating of Perceived Exertion (Exercise) 13 15 15 15 13    Perceived Dyspnea (Exercise) 2 -- -- -- --   Symptoms R leg weakness, chronic pain 6/10 -- none none none   Comments walk test results 2nd day -- -- --   Duration -- Continue with 30 min of aerobic exercise without signs/symptoms of physical distress. Continue with 30 min of aerobic exercise without signs/symptoms of physical distress. Continue with 30 min of aerobic exercise without signs/symptoms of physical distress. Continue with 30 min of aerobic exercise without signs/symptoms of physical distress.   Intensity -- THRR unchanged THRR unchanged THRR unchanged THRR unchanged     Progression   Progression -- Continue to progress workloads to maintain intensity without signs/symptoms of physical distress. Continue to progress workloads to maintain  intensity without signs/symptoms of physical distress. Continue to progress workloads to maintain intensity without signs/symptoms of physical distress. Continue to progress workloads to maintain intensity without signs/symptoms of physical distress.   Average METs -- 2.4 3.77 2.7 2.76     Resistance Training   Training Prescription -- Yes Yes Yes Yes   Weight -- 4 lb 4 lb 5 lb 5 lb   Reps -- 10-15 10-15 10-15 10-15     Interval Training   Interval Training -- -- No No No     Treadmill   MPH -- 2.1 2.4 2.4 --   Grade -- 0 0 0 --   Minutes -- 15 15 15  --   METs -- 2.61 2.84 2.84 --     REL-XR   Level -- -- 6 -- 7   Minutes -- -- 15 -- 15   METs -- -- 4.7 -- 3.6     T5 Nustep   Level -- 4 4 4 6    SPM -- 80 -- -- --   Minutes -- 15  15 15 15    METs -- 2.2 -- 2.5 2.1     Track   Laps -- -- -- -- 30   Minutes -- -- -- -- 15   METs -- -- -- -- 2.6          Exercise Comments:  Exercise Comments    Row Name 07/14/20 0955           Exercise Comments First full day of exercise!  Patient was oriented to gym and equipment including functions, settings, policies, and procedures.  Patient's individual exercise prescription and treatment plan were reviewed.  All starting workloads were established based on the results of the 6 minute walk test done at initial orientation visit.  The plan for exercise progression was also introduced and progression will be customized based on patient's performance and goals.              Exercise Goals and Review:  Exercise Goals    Row Name 07/12/20 1029             Exercise Goals   Increase Physical Activity Yes       Intervention Provide advice, education, support and counseling about physical activity/exercise needs.;Develop an individualized exercise prescription for aerobic and resistive training based on initial evaluation findings, risk stratification, comorbidities and participant's personal goals.       Expected Outcomes Short  Term: Attend rehab on a regular basis to increase amount of physical activity.;Long Term: Add in home exercise to make exercise part of routine and to increase amount of physical activity.;Long Term: Exercising regularly at least 3-5 days a week.       Increase Strength and Stamina Yes       Intervention Provide advice, education, support and counseling about physical activity/exercise needs.;Develop an individualized exercise prescription for aerobic and resistive training based on initial evaluation findings, risk stratification, comorbidities and participant's personal goals.       Expected Outcomes Short Term: Increase workloads from initial exercise prescription for resistance, speed, and METs.;Short Term: Perform resistance training exercises routinely during rehab and add in resistance training at home;Long Term: Improve cardiorespiratory fitness, muscular endurance and strength as measured by increased METs and functional capacity (6MWT)       Able to understand and use rate of perceived exertion (RPE) scale Yes       Intervention Provide education and explanation on how to use RPE scale       Expected Outcomes Short Term: Able to use RPE daily in rehab to express subjective intensity level;Long Term:  Able to use RPE to guide intensity level when exercising independently       Able to understand and use Dyspnea scale Yes       Intervention Provide education and explanation on how to use Dyspnea scale       Expected Outcomes Short Term: Able to use Dyspnea scale daily in rehab to express subjective sense of shortness of breath during exertion;Long Term: Able to use Dyspnea scale to guide intensity level when exercising independently       Knowledge and understanding of Target Heart Rate Range (THRR) Yes       Intervention Provide education and explanation of THRR including how the numbers were predicted and where they are located for reference       Expected Outcomes Short Term: Able to  state/look up THRR;Long Term: Able to use THRR to govern intensity when exercising independently;Short Term: Able to use daily as guideline for intensity  in rehab       Able to check pulse independently Yes       Intervention Provide education and demonstration on how to check pulse in carotid and radial arteries.;Review the importance of being able to check your own pulse for safety during independent exercise       Expected Outcomes Short Term: Able to explain why pulse checking is important during independent exercise;Long Term: Able to check pulse independently and accurately       Understanding of Exercise Prescription Yes       Intervention Provide education, explanation, and written materials on patient's individual exercise prescription       Expected Outcomes Short Term: Able to explain program exercise prescription;Long Term: Able to explain home exercise prescription to exercise independently              Exercise Goals Re-Evaluation :  Exercise Goals Re-Evaluation    Row Name 07/14/20 0955 07/18/20 1252 07/28/20 1002 07/31/20 1350 08/15/20 1220     Exercise Goal Re-Evaluation   Exercise Goals Review Able to understand and use rate of perceived exertion (RPE) scale;Able to understand and use Dyspnea scale;Understanding of Exercise Prescription;Knowledge and understanding of Target Heart Rate Range (THRR) Increase Physical Activity;Increase Strength and Stamina Increase Physical Activity;Increase Strength and Stamina;Understanding of Exercise Prescription Increase Physical Activity;Increase Strength and Stamina;Understanding of Exercise Prescription --   Comments Reviewed RPE and dyspnea scales, THR and program prescription with pt today.  Pt voiced understanding and was given a copy of goals to take home. Willie Clark is teolerating exercise well and has increased to level 4 on T5 NS.  Staff will monitor progress. Patient is using the treadmill at home twice a week. He is going to start walking  the dog for exercise too. patient is going to have home exercise reviewed soon. Willie Clark is doing well in rehab.  He is now on level 6 for the XR.  We will continue to monitor his progress. Willie Clark continues to progress well. He has increased to 5 lb for strength work and to level 4 on T5.   Expected Outcomes Short: Use RPE daily to regulate intensity. Long: Follow program prescription in THR. Short:  attend consistently Long:  improve overall MET level Short: get home exercise review. Long: maintain exercise independently at home. Short: Review home exercise guidelines Long: Continue to improve stamina Short:   Row Name 08/15/20 1222 08/25/20 1017 08/29/20 1421         Exercise Goal Re-Evaluation   Exercise Goals Review Increase Physical Activity;Increase Strength and Stamina Increase Physical Activity;Increase Strength and Stamina Increase Physical Activity;Increase Strength and Stamina;Understanding of Exercise Prescription     Comments -- Uses treadmill twice a week at home. He is going to workout at home when he is finished with the program. Willie Clark has been doing well in rehab.  He is up to level 6 on the T5 NuStep and level 7 on the XR.  We will continue to monitor his progress.     Expected Outcomes try incline on TM Long: continue to build stamina Short: continue to workout at home. Long: add more days working out at home. Short: Review home exercise guidelines Long: Continue to improve stamina.            Discharge Exercise Prescription (Final Exercise Prescription Changes):  Exercise Prescription Changes - 08/29/20 1400      Response to Exercise   Blood Pressure (Admit) 102/60    Blood Pressure (Exercise) 130/76  Blood Pressure (Exit) 124/76    Heart Rate (Admit) 67 bpm    Heart Rate (Exercise) 87 bpm    Heart Rate (Exit) 74 bpm    Rating of Perceived Exertion (Exercise) 13    Symptoms none    Duration Continue with 30 min of aerobic exercise without signs/symptoms of physical  distress.    Intensity THRR unchanged      Progression   Progression Continue to progress workloads to maintain intensity without signs/symptoms of physical distress.    Average METs 2.76      Resistance Training   Training Prescription Yes    Weight 5 lb    Reps 10-15      Interval Training   Interval Training No      REL-XR   Level 7    Minutes 15    METs 3.6      T5 Nustep   Level 6    Minutes 15    METs 2.1      Track   Laps 30    Minutes 15    METs 2.6           Nutrition:  Target Goals: Understanding of nutrition guidelines, daily intake of sodium <156m, cholesterol <2021m calories 30% from fat and 7% or less from saturated fats, daily to have 5 or more servings of fruits and vegetables.  Education: All About Nutrition: -Group instruction provided by verbal, written material, interactive activities, discussions, models, and posters to present general guidelines for heart healthy nutrition including fat, fiber, MyPlate, the role of sodium in heart healthy nutrition, utilization of the nutrition label, and utilization of this knowledge for meal planning. Follow up email sent as well. Written material given at graduation. Flow[facility]  Education need identified 07/12/20      Biometrics:  Pre Biometrics - 07/12/20 1030      Pre Biometrics   Height 5' 9.8" (1.773 m)    Weight 197 lb 3.2 oz (89.4 kg)    BMI (Calculated) 28.45    Single Leg Stand 4.7 seconds            Nutrition Therapy Plan and Nutrition Goals:  Nutrition Therapy & Goals - 07/24/20 0853      Nutrition Therapy   Fiber 30 grams    Whole Grain Foods 3 servings    Saturated Fats 12 max. grams    Fruits and Vegetables 8 servings/day    Sodium 1.5 grams      Personal Nutrition Goals   Nutrition Goal ST: LT:    Comments B: cheese and egg sandwich or bowl of grits L: salads with grilled chicken for lunch (romaine  lettuce) - take out with baked potato D: orange or apple with peanut butter. He reports he fluctuates 2-3 lbs 191.4 lbs (at home). Drinks: water, coffee in am and pm with cream and sugar Discussed heart healthy eating. He will have beans 1-2x/week. He can't eat onions due to sensistivity.      Intervention Plan   Intervention Prescribe, educate and counsel regarding individualized specific dietary modifications aiming towards targeted core components such as weight, hypertension, lipid management, diabetes, heart failure and other comorbidities.;Nutrition handout(s) given to patient.    Expected Outcomes Short Term Goal: Understand basic principles of dietary content, such as calories, fat, sodium, cholesterol and nutrients.;Short Term Goal: A plan has been developed with personal nutrition goals set during dietitian appointment.;Long Term Goal: Adherence to prescribed  nutrition plan.           Nutrition Assessments:  MEDIFICTS Score Key:  ?70 Need to make dietary changes   40-70 Heart Healthy Diet  ? 40 Therapeutic Level Cholesterol Diet  Flowsheet Row Cardiac Rehab from 07/12/2020 in [facility] Cardiac and Pulmonary Rehab  Picture Your Plate Total Score on Admission 70     Picture Your Plate Scores:  <25 Unhealthy dietary pattern with much room for improvement.  41-50 Dietary pattern unlikely to meet recommendations for good health and room for improvement.  51-60 More healthful dietary pattern, with some room for improvement.   >60 Healthy dietary pattern, although there may be some specific behaviors that could be improved.    Nutrition Goals Re-Evaluation:  Nutrition Goals Re-Evaluation    Protection Name 08/25/20 1020             Goals   Current Weight 202 lb (91.6 kg)       Nutrition Goal Lose weight       Comment Patient is not sure if he is gaining weight due to fluid or his eating habits. He is going to try to figure out what he is eating to keep his weight on.        Expected Outcome Short: watch out for fatty foods. Long: maintain a healthy balanced diet that pertains to his needs.              Nutrition Goals Discharge (Final Nutrition Goals Re-Evaluation):  Nutrition Goals Re-Evaluation - 08/25/20 1020      Goals   Current Weight 202 lb (91.6 kg)    Nutrition Goal Lose weight    Comment Patient is not sure if he is gaining weight due to fluid or his eating habits. He is going to try to figure out what he is eating to keep his weight on.    Expected Outcome Short: watch out for fatty foods. Long: maintain a healthy balanced diet that pertains to his needs.           Psychosocial: Target Goals: Acknowledge presence or absence of significant depression and/or stress, maximize coping skills, provide positive support system. Participant is able to verbalize types and ability to use techniques and skills needed for reducing stress and depression.   Education: Stress, Anxiety, and Depression - Group verbal and visual presentation to define topics covered.  Reviews how body is impacted by stress, anxiety, and depression.  Also discusses healthy ways to reduce stress and to treat/manage anxiety and depression.  Written material given at graduation.   Education: Sleep Hygiene -Provides group verbal and written instruction about how sleep can affect your health.  Define sleep hygiene, discuss sleep cycles and impact of sleep habits. Review good sleep hygiene tips.    Initial Review & Psychosocial Screening:  Initial Psych Review & Screening - 07/10/20 1537      Initial Review   Current issues with Current Stress Concerns    Source of Stress Concerns Unable to participate in former interests or hobbies;Unable to perform yard/household activities      [place]? Yes   wife     Barriers   Psychosocial barriers to participate in program There are no identifiable barriers or psychosocial needs.      Screening  Interventions   Interventions Encouraged to exercise;To provide support and resources with identified psychosocial needs;Provide feedback about the scores to participant    Expected Outcomes Short Term goal: Utilizing psychosocial counselor,  staff and physician to assist with identification of specific Stressors or current issues interfering with healing process. Setting desired goal for each stressor or current issue identified.;Long Term Goal: Stressors or current issues are controlled or eliminated.;Short Term goal: Identification and review with participant of any Quality of Life or Depression concerns found by scoring the questionnaire.;Long Term goal: The participant improves quality of Life and PHQ9 Scores as seen by post scores and/or verbalization of changes           Quality of Life Scores:   Quality of Life - 07/12/20 1030      Quality of Life   Select Quality of Life      Quality of Life Scores   Health/Function Pre 19.47 %    Socioeconomic Pre 20.17 %    Psych/Spiritual Pre 23.93 %    Family Pre 22.33 %    GLOBAL Pre 20.89 %          Scores of 19 and below usually indicate a poorer quality of life in these areas.  A difference of  2-3 points is a clinically meaningful difference.  A difference of 2-3 points in the total score of the Quality of Life Index has been associated with significant improvement in overall quality of life, self-image, physical symptoms, and general health in studies assessing change in quality of life.  PHQ-9: Recent Review Flowsheet Data    Depression screen [facility] 2/9 08/16/2020 07/12/2020   Decreased Interest 0 3   Down, Depressed, Hopeless 0 1   PHQ - 2 Score 0 4   Altered sleeping 3 1   Tired, decreased energy 3 2   Change in appetite 1 0   Feeling bad or failure about yourself  0 0   Trouble concentrating 3 0   Moving slowly or fidgety/restless 0 1   Suicidal thoughts 0 0   PHQ-9 Score 10 8   Difficult doing work/chores Somewhat difficult  Somewhat difficult     Interpretation of Total Score  Total Score Depression Severity:  1-4 = Minimal depression, 5-9 = Mild depression, 10-14 = Moderate depression, 15-19 = Moderately severe depression, 20-27 = Severe depression   Psychosocial Evaluation and Intervention:  Psychosocial Evaluation - 07/10/20 1544      Psychosocial Evaluation & Interventions   Interventions Encouraged to exercise with the program and follow exercise prescription;Stress management education;Relaxation education    Comments Willie Clark is currently struggling with shortness of breath and 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 He has done the program before throughout the years so he is familiar with the concept. His breathing makes it hard to do things around the house. He also suffers from arthritis all over his body which he receives injections for, so he is limited on his physical activity. His doctor is aware of his breathing issues and thinks this program will help him. He states besides these current stated issues he isn't experiencing any more stress than usual.    Expected Outcomes Short; attend cardiac rehab for education and exercise. Long: develop positive self care behaviors.    Continue Psychosocial Services  Follow up required by staff           Psychosocial Re-Evaluation:  Psychosocial Re-Evaluation    [place] Name 07/28/20 1010 08/16/20 1044           Psychosocial Re-Evaluation   Current issues with None Identified History of Depression;Current Sleep Concerns      Comments Patient reports no issues with their current mental states,  sleep, stress, depression or anxiety. Will follow up with patient in a few weeks for any changes. Reviewed patient health questionnaire (PHQ-9) with patient for follow up. Previously, patients score indicated signs/symptoms of depression.  Reviewed to see if patient is improving symptom wise while in program.  Score declined and patient states that it is because he has not  been able to sleep well since his wife snores and he has to use the bathroom alot at night. He is going to see his cardiologist and is going to inform them of his energy level.      Expected Outcomes Short: Continue to exercise regularly to support mental health and notify staff of any changes. Long: maintain mental health and well being through teaching of rehab or prescribed medications independently. Short: Continue to work toward an improvement in [place] scores by attending [facility] regularly. Long: Continue to improve stress and depression coping skills by talking with staff and attending [facility] regularly and work toward a positive mental state.      Interventions Encouraged to attend Cardiac Rehabilitation for the exercise Encouraged to attend Cardiac Rehabilitation for the exercise      Continue Psychosocial Services  Follow up required by staff Follow up required by staff             Psychosocial Discharge (Final Psychosocial Re-Evaluation):  Psychosocial Re-Evaluation - 08/16/20 1044      Psychosocial Re-Evaluation   Current issues with History of Depression;Current Sleep Concerns    Comments Reviewed patient health questionnaire (PHQ-9) with patient for follow up. Previously, patients score indicated signs/symptoms of depression.  Reviewed to see if patient is improving symptom wise while in program.  Score declined and patient states that it is because he has not been able to sleep well since his wife snores and he has to use the bathroom alot at night. He is going to see his cardiologist and is going to inform them of his energy level.    Expected Outcomes Short: Continue to work toward an improvement in [place] scores by attending [facility] regularly. Long: Continue to improve stress and depression coping skills by talking with staff and attending [facility] regularly and work toward a positive mental state.    Interventions Encouraged to attend Cardiac Rehabilitation for the exercise     Continue Psychosocial Services  Follow up required by staff           Vocational Rehabilitation: Provide vocational rehab assistance to qualifying candidates.   Vocational Rehab Evaluation & Intervention:  Vocational Rehab - 07/10/20 1537      Initial Vocational Rehab Evaluation & Intervention   Assessment shows need for Vocational Rehabilitation No           Education: Education Goals: Education classes will be provided on a variety of topics geared toward better understanding of heart health and risk factor modification. Participant will state understanding/return demonstration of topics presented as noted by education test scores.  Learning Barriers/Preferences:  Learning Barriers/Preferences - 07/10/20 1536      Learning Barriers/Preferences   Learning Barriers None    Learning Preferences None           General Cardiac Education Topics:  AED/CPR: - Group verbal and written instruction with the use of models to demonstrate the basic use of the AED with the basic ABC's of resuscitation.   Anatomy and Cardiac Procedures: - Group verbal and visual presentation and models provide information about basic cardiac anatomy and function. Reviews the testing methods done to  diagnose heart disease and the outcomes of the test results. Describes the treatment choices: Medical Management, Angioplasty, or Coronary Bypass Surgery for treating various heart conditions including Myocardial Infarction, Angina, Valve Disease, and Cardiac Arrhythmias.  Written material given at graduation. Flowsheet Row Cardiac Rehab from 08/16/2020 in [facility] Cardiac and Pulmonary Rehab  Education need identified 07/12/20  Date 08/16/20  Educator SB  Instruction Review Code 1- Verbalizes Understanding      Medication Safety: - Group verbal and visual instruction to review commonly prescribed medications for heart and lung disease. Reviews the medication, class of the drug, and side effects. Includes  the steps to properly store meds and maintain the prescription regimen.  Written material given at graduation.   Intimacy: - Group verbal instruction through game format to discuss how heart and lung disease can affect sexual intimacy. Written material given at graduation.. Flowsheet Row Cardiac Rehab from 08/16/2020 in [facility] Cardiac and Pulmonary Rehab  Date 08/09/20  Educator AS  Instruction Review Code 1- Verbalizes Understanding      Know Your Numbers and Heart Failure: - Group verbal and visual instruction to discuss disease risk factors for cardiac and pulmonary disease and treatment options.  Reviews associated critical values for Overweight/Obesity, Hypertension, Cholesterol, and Diabetes.  Discusses basics of heart failure: signs/symptoms and treatments.  Introduces Heart Failure Zone chart for action plan for heart failure.  Written material given at graduation.   Infection Prevention: - Provides verbal and written material to individual with discussion of infection control including proper hand washing and proper equipment cleaning during exercise session. Flowsheet Row Cardiac Rehab from 08/16/2020 in [facility] Cardiac and Pulmonary Rehab  Date 07/12/20  Educator [facility]  Instruction Review Code 1- Verbalizes Understanding      Falls Prevention: - Provides verbal and written material to individual with discussion of falls prevention and safety. Flowsheet Row Cardiac Rehab from 08/16/2020 in [facility] Cardiac and Pulmonary Rehab  Date 07/12/20  Educator [facility]  Instruction Review Code 1- Verbalizes Understanding      Other: -Provides group and verbal instruction on various topics (see comments)   Knowledge Questionnaire Score:  Knowledge Questionnaire Score - 07/12/20 1031      Knowledge Questionnaire Score   Pre Score 21/26 Education Focus: Angina, sex, nutrition, exercise           Core Components/Risk Factors/Patient Goals at Admission:  Personal Goals and Risk Factors at  Admission - 07/12/20 1032      Core Components/Risk Factors/Patient Goals on Admission    Weight Management Yes;Weight Loss    Intervention Weight Management: Develop a combined nutrition and exercise program designed to reach desired caloric intake, while maintaining appropriate intake of nutrient and fiber, sodium and fats, and appropriate energy expenditure required for the weight goal.;Weight Management: Provide education and appropriate resources to help participant work on and attain dietary goals.;Weight Management/Obesity: Establish reasonable short term and long term weight goals.    Admit Weight 197 lb 3.2 oz (89.4 kg)    Goal Weight: Short Term 192 lb (87.1 kg)    Goal Weight: Long Term 185 lb (83.9 kg)    Expected Outcomes Short Term: Continue to assess and modify interventions until short term weight is achieved;Long Term: Adherence to nutrition and physical activity/exercise program aimed toward attainment of established weight goal;Weight Loss: Understanding of general recommendations for a balanced deficit meal plan, which promotes 1-2 lb weight loss per week and includes a negative energy balance of [phone] kcal/d;Understanding recommendations for meals to include 15-35%  energy as protein, 25-35% energy from fat, 35-60% energy from carbohydrates, less than 221m of dietary cholesterol, 20-35 gm of total fiber daily;Understanding of distribution of calorie intake throughout the day with the consumption of 4-5 meals/snacks    Heart Failure Yes    Intervention Provide a combined exercise and nutrition program that is supplemented with education, support and counseling about heart failure. Directed toward relieving symptoms such as shortness of breath, decreased exercise tolerance, and extremity edema.    Expected Outcomes Improve functional capacity of life;Short term: Attendance in program 2-3 days a week with increased exercise capacity. Reported lower sodium intake. Reported increased  fruit and vegetable intake. Reports medication compliance.;Short term: Daily weights obtained and reported for increase. Utilizing diuretic protocols set by physician.;Long term: Adoption of self-care skills and reduction of barriers for early signs and symptoms recognition and intervention leading to self-care maintenance.    Hypertension Yes    Intervention Provide education on lifestyle modifcations including regular physical activity/exercise, weight management, moderate sodium restriction and increased consumption of fresh fruit, vegetables, and low fat dairy, alcohol moderation, and smoking cessation.;Monitor prescription use compliance.    Expected Outcomes Short Term: Continued assessment and intervention until BP is < 140/92mHG in hypertensive participants. < 130/8062mG in hypertensive participants with diabetes, heart failure or chronic kidney disease.;Long Term: Maintenance of blood pressure at goal levels.    Lipids Yes    Intervention Provide education and support for participant on nutrition & aerobic/resistive exercise along with prescribed medications to achieve LDL <9m26mDL >40mg51m Expected Outcomes Short Term: Participant states understanding of desired cholesterol values and is compliant with medications prescribed. Participant is following exercise prescription and nutrition guidelines.;Long Term: Cholesterol controlled with medications as prescribed, with individualized exercise RX and with personalized nutrition plan. Value goals: LDL < 9mg,9m > 40 mg.           Education:Diabetes - Individual verbal and written instruction to review signs/symptoms of diabetes, desired ranges of glucose level fasting, after meals and with exercise. Acknowledge that pre and post exercise glucose checks will be done for 3 sessions at entry of program.   Core Components/Risk Factors/Patient Goals Review:   Goals and Risk Factor Review    Row Name 07/28/20 1005 08/25/20 1018            Core Components/Risk Factors/Patient Goals Review   Personal Goals Review Weight Management/Obesity;Hypertension Weight Management/Obesity      Review Willie Willie Browrking on losing weight. He would like to reach a weight goal of 175lbs. He has been taking his blood pressure at home every morning. He has been 120's over 60's on a routine basis. Sometimes his blood pressure gets low and he can feel it. Willie Willie Brows that his weight is up due to fluid. He is going to talk to his Cardiologist at 2pm today. Willie Willie Browinform the staff of any medication changes.      Expected Outcomes Short: monitor blood pressure. Long: inform doctor of lower readings. Short: go to Cardiologist appointment. Long: maintain weight and fluid medication independently.             Core Components/Risk Factors/Patient Goals at Discharge (Final Review):   Goals and Risk Factor Review - 08/25/20 1018      Core Components/Risk Factors/Patient Goals Review   Personal Goals Review Weight Management/Obesity    Review Willie Willie Brows that his weight is up due to fluid. He is going to talk to his Cardiologist at  2pm today. Willie Clark will inform the staff of any medication changes.    Expected Outcomes Short: go to Cardiologist appointment. Long: maintain weight and fluid medication independently.           ITP Comments:  ITP Comments    Row Name 07/10/20 1550 07/12/20 1026 07/14/20 0955 08/09/20 0635 09/06/20 0805   ITP Comments Initial telephone orientation completed. Diagnosis can be found in [facility] 3/1. EP orientation scheduled for Wed 3/23 at 9:30 Completed 6MWT and gym orientation. Initial ITP created and sent for review to Dr. Emily Filbert, Medical Director. First full day of exercise!  Patient was oriented to gym and equipment including functions, settings, policies, and procedures.  Patient's individual exercise prescription and treatment plan were reviewed.  All starting workloads were established based on the results of the 6  minute walk test done at initial orientation visit.  The plan for exercise progression was also introduced and progression will be customized based on patient's performance and goals. 30 Day review completed. Medical Director ITP review done, changes made as directed, and signed approval by Medical Director. 30 Day review completed. Medical Director ITP review done, changes made as directed, and signed approval by Medical Director.          Comments:

## 2020-09-07 ENCOUNTER — Telehealth: Payer: Self-pay | Admitting: Cardiovascular Disease

## 2020-09-07 NOTE — Telephone Encounter (Signed)
Willie Clark had a left heart catheterization 06/20/2020 where he received PCI and stenting x1.  He will need to remain on dual antiplatelet therapy uninterrupted for 12 months.  He is not eligible for spinal injection at this time.  Please contact requesting office so that they recommend alternative therapies.  Thank you.  I will remove Willie Clark from the preoperative pool.

## 2020-09-07 NOTE — Telephone Encounter (Signed)
   [facility] Pre-operative Risk Assessment    Patient Name: Willie Clark  DOB: Jan 14, 1951  MRN: [id number]   HEARTCARE STAFF: - Please ensure there is not already an duplicate clearance open for this procedure. - Under Visit Info/Reason for Call, type in Other and utilize the format Clearance MM/DD/YY or Clearance TBD. Do not use dashes or single digits. - If request is for dental extraction, please clarify the # of teeth to be extracted.  Request for surgical clearance:  1. What type of surgery is being performed? LESI L4-5  2. When is this surgery scheduled? TBD  3. What type of clearance is required (medical clearance vs. Pharmacy clearance to hold med vs. Both)? pharm  4. Are there any medications that need to be held prior to surgery and how long? brilinta 5 days prior  5. Practice name and name of physician performing surgery? [facility] Dr. Davy Pique  6. What is the office phone number? [phone]   7.   What is the office fax number? [phone]  8.   Anesthesia type (None, local, MAC, general) ? Not noted   Marykay Lex 09/07/2020, 3:54 PM  _________________________________________________________________   (provider comments below)

## 2020-09-08 ENCOUNTER — Other Ambulatory Visit: Payer: Self-pay

## 2020-09-08 ENCOUNTER — Other Ambulatory Visit: Payer: Self-pay | Admitting: Medical

## 2020-09-08 ENCOUNTER — Encounter: Payer: Medicare HMO | Admitting: *Deleted

## 2020-09-08 DIAGNOSIS — I214 Non-ST elevation (NSTEMI) myocardial infarction: Secondary | ICD-10-CM

## 2020-09-08 DIAGNOSIS — Z955 Presence of coronary angioplasty implant and graft: Secondary | ICD-10-CM

## 2020-09-08 NOTE — Progress Notes (Signed)
Daily Session Note  Patient Details  Name: JATHNIEL SMELTZER MRN: [id number] Date of Birth: 1950/07/25 Referring Provider:   Flowsheet Row Cardiac Rehab from 07/12/2020 in [facility] Cardiac and Pulmonary Rehab  Referring Provider End, Harrell Gave MD  [facility] Cardiologist: Dr. Rogue Jury Arida]      Encounter Date: 09/08/2020  Check In:  Session Check In - 09/08/20 0955      Check-In   Supervising physician immediately available to respond to emergencies See telemetry face sheet for immediately available ER MD    Location [facility]-Cardiac & Pulmonary Rehab    Staff Present Renita Papa, RN BSN;Joseph [address] [place], [place], RCEP, CCRP, CCET    Virtual Visit No    Medication changes reported     No    Fall or balance concerns reported    No    Warm-up and Cool-down Performed on first and last piece of equipment    Resistance Training Performed Yes    VAD Patient? No    PAD/SET Patient? No      Pain Assessment   Currently in Pain? No/denies              Social History   Tobacco Use  Smoking Status Former Smoker  . Packs/day: 2.50  . Types: Cigarettes  . Quit date: 08/21/1991  . Years since quitting: 29.0  Smokeless Tobacco Never Used    Goals Met:  Independence with exercise equipment Exercise tolerated well No report of cardiac concerns or symptoms Strength training completed today  Goals Unmet:  Not Applicable  Comments: Pt able to follow exercise prescription today without complaint.  Will continue to monitor for progression.    Dr. Emily Filbert is Medical Director for [place] [facility].

## 2020-09-08 NOTE — Telephone Encounter (Signed)
S/w Nikki at Dr. Dionne Ano office in regards to clearance request. Per cardiologist the pt had recent left heart cath with PCI and stenting x 1. Pt will need uninterrupted dual antiplatelet therapy x 12 months. May need to recommend alternative therapies. Assured Lexine Baton that I will fax these notes to fax # [phone]. Lexine Baton states she will inform Dr. Dionne Ano secretary fax will be coming over about clearance.

## 2020-09-11 ENCOUNTER — Encounter (HOSPITAL_BASED_OUTPATIENT_CLINIC_OR_DEPARTMENT_OTHER): Payer: Medicare HMO | Admitting: *Deleted

## 2020-09-11 ENCOUNTER — Other Ambulatory Visit: Payer: Self-pay

## 2020-09-11 DIAGNOSIS — Z955 Presence of coronary angioplasty implant and graft: Secondary | ICD-10-CM

## 2020-09-11 DIAGNOSIS — I214 Non-ST elevation (NSTEMI) myocardial infarction: Secondary | ICD-10-CM

## 2020-09-11 NOTE — Progress Notes (Signed)
Daily Session Note  Patient Details  Name: Willie Clark MRN: [id number] Date of Birth: 1950-08-12 Referring Provider:   Flowsheet Row Cardiac Rehab from 07/12/2020 in [facility] Cardiac and Pulmonary Rehab  Referring Provider End, Harrell Gave MD  [facility] Cardiologist: Dr. Rogue Jury Arida]      Encounter Date: 09/11/2020  Check In:  Session Check In - 09/11/20 1035      Check-In   Supervising physician immediately available to respond to emergencies See telemetry face sheet for immediately available ER MD    Location [facility]-Cardiac & Pulmonary Rehab    Staff Present Renita Papa, RN BSN;Joseph [address] [place], [place], ACSM CEP, Exercise Physiologist    Virtual Visit No    Medication changes reported     No    Fall or balance concerns reported    No    Warm-up and Cool-down Performed on first and last piece of equipment    Resistance Training Performed Yes    VAD Patient? No    PAD/SET Patient? No      Pain Assessment   Currently in Pain? No/denies              Social History   Tobacco Use  Smoking Status Former Smoker  . Packs/day: 2.50  . Types: Cigarettes  . Quit date: 08/21/1991  . Years since quitting: 29.0  Smokeless Tobacco Never Used    Goals Met:  Independence with exercise equipment Exercise tolerated well No report of cardiac concerns or symptoms Strength training completed today  Goals Unmet:  Not Applicable  Comments: Pt able to follow exercise prescription today without complaint.  Will continue to monitor for progression.    Dr. Emily Filbert is Medical Director for [place].  Dr. Ottie Glazier is Medical Director for [facility] Pulmonary Rehabilitation.

## 2020-09-13 ENCOUNTER — Other Ambulatory Visit: Payer: Self-pay

## 2020-09-13 DIAGNOSIS — I214 Non-ST elevation (NSTEMI) myocardial infarction: Secondary | ICD-10-CM | POA: Diagnosis not present

## 2020-09-13 DIAGNOSIS — Z955 Presence of coronary angioplasty implant and graft: Secondary | ICD-10-CM

## 2020-09-13 NOTE — Progress Notes (Signed)
Daily Session Note  Patient Details  Name: Willie Clark MRN: [id number] Date of Birth: [birth year] Referring Provider:   Flowsheet Row Cardiac Rehab from 07/12/2020 in [facility] Cardiac and Pulmonary Rehab  Referring Provider End, Harrell Gave MD  [facility] Cardiologist: Dr. Rogue Jury Arida]      Encounter Date: 09/13/2020  Check In:  Session Check In - 09/13/20 1108      Check-In   Supervising physician immediately available to respond to emergencies See telemetry face sheet for immediately available ER MD    Location [facility]-Cardiac & Pulmonary Rehab    Staff Present Birdie Sons, MPA, RN;Amanda Sommer, BA, ACSM CEP, Exercise Physiologist;Melissa Caiola RDN, LDN;Joseph Lomira [employer] [place]    Virtual Visit No    Medication changes reported     No    Fall or balance concerns reported    No    Warm-up and Cool-down Performed on first and last piece of equipment    Resistance Training Performed Yes    VAD Patient? No    PAD/SET Patient? No      Pain Assessment   Currently in Pain? No/denies              Social History   Tobacco Use  Smoking Status Former Smoker  . Packs/day: 2.50  . Types: Cigarettes  . Quit date: 08/21/1991  . Years since quitting: 29.0  Smokeless Tobacco Never Used    Goals Met:  Independence with exercise equipment Exercise tolerated well No report of cardiac concerns or symptoms Strength training completed today  Goals Unmet:  Not Applicable  Comments: Pt able to follow exercise prescription today without complaint.  Will continue to monitor for progression.    Dr. Emily Filbert is Medical Director for [place].  Dr. Ottie Glazier is Medical Director for [facility] Pulmonary Rehabilitation.

## 2020-09-15 ENCOUNTER — Other Ambulatory Visit: Payer: Self-pay

## 2020-09-15 ENCOUNTER — Encounter: Payer: Medicare HMO | Admitting: *Deleted

## 2020-09-15 DIAGNOSIS — Z955 Presence of coronary angioplasty implant and graft: Secondary | ICD-10-CM

## 2020-09-15 DIAGNOSIS — I214 Non-ST elevation (NSTEMI) myocardial infarction: Secondary | ICD-10-CM | POA: Diagnosis not present

## 2020-09-15 NOTE — Progress Notes (Signed)
Daily Session Note  Patient Details  Name: Willie Clark MRN: [id number] Date of Birth: 1950-08-03 Referring Provider:   Flowsheet Row Cardiac Rehab from 07/12/2020 in [facility] Cardiac and Pulmonary Rehab  Referring Provider End, Harrell Gave MD  [facility] Cardiologist: Dr. Rogue Jury Arida]      Encounter Date: 09/15/2020  Check In:  Session Check In - 09/15/20 0946      Check-In   Supervising physician immediately available to respond to emergencies See telemetry face sheet for immediately available ER MD    Location [facility]-Cardiac & Pulmonary Rehab    Staff Present Renita Papa, RN Margurite Auerbach, MS, ASCM CEP, Exercise Physiologist;Jessica Luan Pulling, MA, RCEP, CCRP, CCET    Virtual Visit No    Medication changes reported     No    Fall or balance concerns reported    No    Warm-up and Cool-down Performed on first and last piece of equipment    Resistance Training Performed Yes    VAD Patient? No    PAD/SET Patient? No      Pain Assessment   Currently in Pain? No/denies              Social History   Tobacco Use  Smoking Status Former Smoker  . Packs/day: 2.50  . Types: Cigarettes  . Quit date: 08/21/1991  . Years since quitting: 29.0  Smokeless Tobacco Never Used    Goals Met:  Independence with exercise equipment Exercise tolerated well No report of cardiac concerns or symptoms Strength training completed today  Goals Unmet:  Not Applicable  Comments: Pt able to follow exercise prescription today without complaint.  Will continue to monitor for progression.     Dr. Emily Filbert is Medical Director for [place].  Dr. Ottie Glazier is Medical Director for [facility] Pulmonary Rehabilitation.

## 2020-09-20 ENCOUNTER — Encounter: Payer: Medicare HMO | Attending: Internal Medicine | Admitting: *Deleted

## 2020-09-20 ENCOUNTER — Other Ambulatory Visit: Payer: Self-pay

## 2020-09-20 DIAGNOSIS — I214 Non-ST elevation (NSTEMI) myocardial infarction: Secondary | ICD-10-CM | POA: Insufficient documentation

## 2020-09-20 DIAGNOSIS — Z955 Presence of coronary angioplasty implant and graft: Secondary | ICD-10-CM | POA: Diagnosis present

## 2020-09-20 NOTE — Progress Notes (Signed)
Daily Session Note  Patient Details  Name: Willie Clark MRN: [id number] Date of Birth: Mar 23, 1951 Referring Provider:   Flowsheet Row Cardiac Rehab from 07/12/2020 in [facility] Cardiac and Pulmonary Rehab  Referring Provider End, Harrell Gave MD  [facility] Cardiologist: Dr. Rogue Jury Arida]      Encounter Date: 09/20/2020  Check In:  Session Check In - 09/20/20 1103      Check-In   Supervising physician immediately available to respond to emergencies See telemetry face sheet for immediately available ER MD    Location [facility]-Cardiac & Pulmonary Rehab    Staff Present Renita Papa, RN BSN;Melissa Caiola RDN, LDN;Jessica [place], MA, RCEP, CCRP, CCET;Amanda Sommer, BA, ACSM CEP, Exercise Physiologist    Virtual Visit No    Medication changes reported     No    Fall or balance concerns reported    No    Warm-up and Cool-down Performed on first and last piece of equipment    Resistance Training Performed Yes    VAD Patient? No    PAD/SET Patient? No      Pain Assessment   Currently in Pain? No/denies              Social History   Tobacco Use  Smoking Status Former Smoker  . Packs/day: 2.50  . Types: Cigarettes  . Quit date: 08/21/1991  . Years since quitting: 29.1  Smokeless Tobacco Never Used    Goals Met:  Independence with exercise equipment Exercise tolerated well No report of cardiac concerns or symptoms Strength training completed today  Goals Unmet:  Not Applicable  Comments: Pt able to follow exercise prescription today without complaint.  Will continue to monitor for progression.    Dr. Emily Filbert is Medical Director for [place].  Dr. Ottie Glazier is Medical Director for [facility] Pulmonary Rehabilitation.

## 2020-09-22 ENCOUNTER — Encounter: Payer: Medicare HMO | Admitting: *Deleted

## 2020-09-22 ENCOUNTER — Other Ambulatory Visit: Payer: Self-pay

## 2020-09-22 DIAGNOSIS — I214 Non-ST elevation (NSTEMI) myocardial infarction: Secondary | ICD-10-CM

## 2020-09-22 DIAGNOSIS — Z955 Presence of coronary angioplasty implant and graft: Secondary | ICD-10-CM

## 2020-09-22 NOTE — Progress Notes (Signed)
Daily Session Note  Patient Details  Name: EPIFANIO LABRADOR MRN: [id number] Date of Birth: 25-Sep-1950 Referring Provider:   Flowsheet Row Cardiac Rehab from 07/12/2020 in [facility] Cardiac and Pulmonary Rehab  Referring Provider End, Harrell Gave MD  [facility] Cardiologist: Dr. Rogue Jury Arida]      Encounter Date: 09/22/2020  Check In:  Session Check In - 09/22/20 1017      Check-In   Supervising physician immediately available to respond to emergencies See telemetry face sheet for immediately available ER MD    Location [facility]-Cardiac & Pulmonary Rehab    Staff Present Heath Lark, RN, BSN, CCRP;Jessica [place], MA, RCEP, CCRP, CCET;Joseph [place] RCP,RRT,BSRT    Virtual Visit No    Medication changes reported     No    Fall or balance concerns reported    No    Warm-up and Cool-down Performed on first and last piece of equipment    Resistance Training Performed Yes    VAD Patient? No    PAD/SET Patient? No      Pain Assessment   Currently in Pain? No/denies              Social History   Tobacco Use  Smoking Status Former Smoker  . Packs/day: 2.50  . Types: Cigarettes  . Quit date: 08/21/1991  . Years since quitting: 29.1  Smokeless Tobacco Never Used    Goals Met:  Independence with exercise equipment Exercise tolerated well No report of cardiac concerns or symptoms  Goals Unmet:  Not Applicable  Comments: Pt able to follow exercise prescription today without complaint.  Will continue to monitor for progression.    Dr. Emily Filbert is Medical Director for [place].  Dr. Ottie Glazier is Medical Director for [facility] Pulmonary Rehabilitation.

## 2020-09-23 ENCOUNTER — Other Ambulatory Visit: Payer: Self-pay | Admitting: Family

## 2020-09-23 DIAGNOSIS — I10 Essential (primary) hypertension: Secondary | ICD-10-CM

## 2020-09-23 DIAGNOSIS — I5022 Chronic systolic (congestive) heart failure: Secondary | ICD-10-CM

## 2020-09-25 ENCOUNTER — Other Ambulatory Visit: Payer: Self-pay

## 2020-09-25 DIAGNOSIS — I214 Non-ST elevation (NSTEMI) myocardial infarction: Secondary | ICD-10-CM | POA: Diagnosis not present

## 2020-09-25 DIAGNOSIS — Z955 Presence of coronary angioplasty implant and graft: Secondary | ICD-10-CM

## 2020-09-25 NOTE — Progress Notes (Signed)
Daily Session Note  Patient Details  Name: Willie Clark MRN: [id number] Date of Birth: 01/18/1951 Referring Provider:   Flowsheet Row Cardiac Rehab from 07/12/2020 in [facility] Cardiac and Pulmonary Rehab  Referring Provider End, Christopher MD  [Primary Cardiologist: Dr. Muhammad Arida]      Encounter Date: 09/25/2020  Check In:  Session Check In - 09/25/20 1357      Check-In   Supervising physician immediately available to respond to emergencies See telemetry face sheet for immediately available ER MD    Location [facility]-Cardiac & Pulmonary Rehab    Staff Present Kelly Bollinger, MPA, RN;Kelly Hayes, BS, ACSM CEP, Exercise Physiologist;Joseph Hood RCP,RRT,BSRT;Susanne Bice, RN, BSN, CCRP    Virtual Visit No    Medication changes reported     No    Fall or balance concerns reported    No    Warm-up and Cool-down Performed on first and last piece of equipment    Resistance Training Performed Yes    VAD Patient? No    PAD/SET Patient? No      Pain Assessment   Currently in Pain? No/denies              Social History   Tobacco Use  Smoking Status Former Smoker  . Packs/day: 2.50  . Types: Cigarettes  . Quit date: 08/21/1991  . Years since quitting: 29.1  Smokeless Tobacco Never Used    Goals Met:  Independence with exercise equipment Exercise tolerated well No report of cardiac concerns or symptoms Strength training completed today  Goals Unmet:  Not Applicable  Comments: Pt able to follow exercise prescription today without complaint.  Will continue to monitor for progression.    Dr. Mark Miller is Medical Director for [facility].  Dr. Fuad Aleskerov is Medical Director for [facility] Pulmonary Rehabilitation. 

## 2020-09-27 ENCOUNTER — Other Ambulatory Visit: Payer: Self-pay

## 2020-09-27 DIAGNOSIS — I214 Non-ST elevation (NSTEMI) myocardial infarction: Secondary | ICD-10-CM | POA: Diagnosis not present

## 2020-09-27 DIAGNOSIS — Z955 Presence of coronary angioplasty implant and graft: Secondary | ICD-10-CM

## 2020-09-27 NOTE — Progress Notes (Signed)
Daily Session Note  Patient Details  Name: Willie Clark MRN: [id number] Date of Birth: Mar 15, 1951 Referring Provider:   Flowsheet Row Cardiac Rehab from 07/12/2020 in [facility] Cardiac and Pulmonary Rehab  Referring Provider End, Harrell Gave MD  [facility] Cardiologist: Dr. Rogue Jury Arida]      Encounter Date: 09/27/2020  Check In:  Session Check In - 09/27/20 0953      Check-In   Supervising physician immediately available to respond to emergencies See telemetry face sheet for immediately available ER MD    Location [facility]-Cardiac & Pulmonary Rehab    Staff Present Birdie Sons, MPA, RN;Amanda Oletta Darter, BA, ACSM CEP, Exercise Physiologist;Joseph Tessie Fass RCP,RRT,BSRT    Virtual Visit No    Medication changes reported     No    Fall or balance concerns reported    No    Warm-up and Cool-down Performed on first and last piece of equipment    Resistance Training Performed Yes    VAD Patient? No    PAD/SET Patient? No      Pain Assessment   Currently in Pain? No/denies              Social History   Tobacco Use  Smoking Status Former Smoker  . Packs/day: 2.50  . Types: Cigarettes  . Quit date: 08/21/1991  . Years since quitting: 29.1  Smokeless Tobacco Never Used    Goals Met:  Independence with exercise equipment Exercise tolerated well No report of cardiac concerns or symptoms Strength training completed today  Goals Unmet:  Not Applicable  Comments: Pt able to follow exercise prescription today without complaint.  Will continue to monitor for progression.    Dr. Emily Filbert is Medical Director for [place].  Dr. Ottie Glazier is Medical Director for [facility] Pulmonary Rehabilitation.

## 2020-09-29 ENCOUNTER — Other Ambulatory Visit: Payer: Self-pay

## 2020-09-29 DIAGNOSIS — I214 Non-ST elevation (NSTEMI) myocardial infarction: Secondary | ICD-10-CM

## 2020-09-29 NOTE — Progress Notes (Signed)
Daily Session Note  Patient Details  Name: MORRELL FLUKE MRN: [id number] Date of Birth: [birth year] Referring Provider:   Flowsheet Row Cardiac Rehab from 07/12/2020 in [facility] Cardiac and Pulmonary Rehab  Referring Provider End, Harrell Gave MD  [facility] Cardiologist: Dr. Rogue Jury Arida]       Encounter Date: 09/29/2020  Check In:  Session Check In - 09/29/20 0938       Check-In   Supervising physician immediately available to respond to emergencies See telemetry face sheet for immediately available ER MD    Location [facility]-Cardiac & Pulmonary Rehab    Staff Present Justin Mend RCP,RRT,BSRT;Vida Rigger RN, BSN;Jessica Luan Pulling, MA, RCEP, CCRP, CCET    Virtual Visit No    Medication changes reported     No    Fall or balance concerns reported    No    Warm-up and Cool-down Performed on first and last piece of equipment    Resistance Training Performed Yes    VAD Patient? No    PAD/SET Patient? No      Pain Assessment   Currently in Pain? No/denies                Social History   Tobacco Use  Smoking Status Former   Packs/day: 2.50   Pack years: 0.00   Types: Cigarettes   Quit date: 08/21/1991   Years since quitting: 29.1  Smokeless Tobacco Never    Goals Met:  Independence with exercise equipment Exercise tolerated well No report of cardiac concerns or symptoms Strength training completed today  Goals Unmet:  Not Applicable  Comments: Pt able to follow exercise prescription today without complaint.  Will continue to monitor for progression.   Dr. Emily Filbert is Medical Director for [place].  Dr. Ottie Glazier is Medical Director for [facility] Pulmonary Rehabilitation.

## 2020-09-29 NOTE — Patient Instructions (Signed)
Discharge Patient Instructions  Patient Details  Name: Willie Clark MRN: [id number] Date of Birth: 08-20-1950 Referring Provider:  Nelva Bush, MD   Number of Visits: 57  Reason for Discharge:  Patient reached a stable level of exercise. Patient independent in their exercise. Patient has met program and personal goals.  Smoking History:  Social History   Tobacco Use  Smoking Status Former   Packs/day: 2.50   Pack years: 0.00   Types: Cigarettes   Quit date: 08/21/1991   Years since quitting: 29.1  Smokeless Tobacco Never    Diagnosis:  No diagnosis found.  Initial Exercise Prescription:  Initial Exercise Prescription - 07/12/20 1000       Date of Initial Exercise RX and Referring Provider   Date 07/12/20    Referring Provider End, Harrell Gave MD   Primary Cardiologist: Dr. [name]     Treadmill   MPH 2    Grade 0    Minutes 15    METs 2.53      REL-XR   Level 1    Speed 50    Minutes 15    METs 2      T5 Nustep   Level 1    SPM 80    Minutes 15    METs 2      Prescription Details   Frequency (times per week) 3    Duration Progress to 30 minutes of continuous aerobic without signs/symptoms of physical distress      Intensity   THRR 40-80% of Max Heartrate 98-133    Ratings of Perceived Exertion 11-13    Perceived Dyspnea 0-4      Progression   Progression Continue to progress workloads to maintain intensity without signs/symptoms of physical distress.      Resistance Training   Training Prescription Yes    Weight 4 lb    Reps 10-15             Discharge Exercise Prescription (Final Exercise Prescription Changes):  Exercise Prescription Changes - 09/26/20 1400       Response to Exercise   Blood Pressure (Admit) 144/82    Blood Pressure (Exercise) 128/60    Blood Pressure (Exit) 96/62    Heart Rate (Admit) 59 bpm    Heart Rate (Exercise) 80 bpm    Heart Rate (Exit) 67 bpm    Rating of Perceived Exertion (Exercise) 14     Symptoms none    Duration Continue with 30 min of aerobic exercise without signs/symptoms of physical distress.    Intensity THRR unchanged      Progression   Progression Continue to progress workloads to maintain intensity without signs/symptoms of physical distress.    Average METs 3      Resistance Training   Training Prescription Yes    Weight 5 lb    Reps 10-15      Interval Training   Interval Training No      Recumbant Bike   Level 10    Watts 36    Minutes 15    METs 3.22      NuStep   Level 5    Minutes 15    METs 3.4      T5 Nustep   Level 5    Minutes 15    METs 2.2      Track   Laps 43    Minutes 15    METs 3.4  Functional Capacity:  6 Minute Walk     Row Name 07/12/20 1026 09/27/20 1201       6 Minute Walk   Phase Initial Discharge    Distance 1080 feet 1465 feet    Distance % Change -- 35.6 %    Distance Feet Change -- 385 ft    Walk Time 6 minutes 6 minutes    # of Rest Breaks 0 0    MPH 2.04 2.78    METS 2.5 2.9    RPE 13 13    Perceived Dyspnea  2 2    VO2 Peak 8.75 10.15    Symptoms Yes (comment) No    Comments 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 HR 81 bpm 74 bpm    Max Ex. BP 174/72 126/74    2 Minute Post BP 134/68 --              Nutrition Therapy & Goals - 07/24/20 0853       Nutrition Therapy   Fiber 30 grams    Whole Grain Foods 3 servings    Saturated Fats 12 max. grams    Fruits and Vegetables 8 servings/day    Sodium 1.5 grams      Personal Nutrition Goals   Nutrition Goal ST: LT:    Comments B: cheese and egg sandwich or bowl of grits L: salads with grilled chicken for lunch (romaine lettuce) - take out with baked potato D: orange or apple with peanut butter. He reports he fluctuates 2-3 lbs 191.4 lbs (at home).  Drinks: water, coffee in am and pm with cream and sugar Discussed heart healthy eating. He will have beans 1-2x/week. He can't eat onions due to sensistivity.      Intervention Plan   Intervention Prescribe, educate and counsel regarding individualized specific dietary modifications aiming towards targeted core components such as weight, hypertension, lipid management, diabetes, heart failure and other comorbidities.;Nutrition handout(s) given to patient.    Expected Outcomes Short Term Goal: Understand basic principles of dietary content, such as calories, fat, sodium, cholesterol and nutrients.;Short Term Goal: A plan has been developed with personal nutrition goals set during dietitian appointment.;Long Term Goal: Adherence to prescribed nutrition plan.               Goals reviewed with patient; copy given to patient.

## 2020-10-02 ENCOUNTER — Encounter: Payer: Medicare HMO | Admitting: *Deleted

## 2020-10-02 ENCOUNTER — Other Ambulatory Visit: Payer: Self-pay

## 2020-10-02 DIAGNOSIS — I214 Non-ST elevation (NSTEMI) myocardial infarction: Secondary | ICD-10-CM | POA: Diagnosis not present

## 2020-10-02 DIAGNOSIS — Z955 Presence of coronary angioplasty implant and graft: Secondary | ICD-10-CM

## 2020-10-02 NOTE — Progress Notes (Signed)
Cardiac Individual Treatment Plan  Patient Details  Name: OBADIAH DENNARD MRN: [id number] Date of Birth: 03/23/1951 Referring Provider:   Flowsheet Row Cardiac Rehab from 07/12/2020 in [facility] Cardiac and Pulmonary Rehab  Referring Provider End, Harrell Gave MD  [facility] Cardiologist: Dr. Rogue Jury Arida]       Initial Encounter Date:  Flowsheet Row Cardiac Rehab from 07/12/2020 in [facility] Cardiac and Pulmonary Rehab  Date 07/12/20       Visit Diagnosis: NSTEMI (non-ST elevated myocardial infarction) [facility])  Status post coronary artery stent placement  Patient's Home Medications on Admission:  Current Outpatient Medications:    albuterol (PROVENTIL HFA;VENTOLIN HFA) 108 (90 Base) MCG/ACT inhaler, Inhale into the lungs every 4 (four) hours as needed for wheezing or shortness of breath., Disp: , Rfl:    aspirin EC 81 MG EC tablet, Take 1 tablet (81 mg total) by mouth daily. Swallow whole., Disp: 30 tablet, Rfl: 11   carvedilol (COREG) 3.125 MG tablet, Take 1 tablet (3.125 mg total) by mouth 2 (two) times daily with a meal., Disp: 180 tablet, Rfl: 0   citalopram (CELEXA) 20 MG tablet, Take 20 mg by mouth daily. , Disp: , Rfl:    dapagliflozin propanediol (FARXIGA) 10 MG TABS tablet, Take 1 tablet (10 mg total) by mouth daily before breakfast., Disp: 30 tablet, Rfl: 5   EPINEPHrine 0.3 mg/0.3 mL IJ SOAJ injection, Inject 0.3 mg into the muscle as needed. , Disp: , Rfl:    Erenumab-aooe 140 MG/ML SOAJ, Inject 140 mg into the skin every 28 (twenty-eight) days., Disp: , Rfl:    finasteride (PROSCAR) 5 MG tablet, Take 5 mg by mouth daily., Disp: , Rfl:    furosemide (LASIX) 40 MG tablet, TAKE 1 TABLET (40 MG TOTAL) BY MOUTH 2 (TWO) TIMES DAILY FOR 3 DAYS, THEN 1 TABLET (40 MG TOTAL) DAILY FOR 27 DAYS., Disp: 33 tablet, Rfl: 0   gabapentin (NEURONTIN) 800 MG tablet, Take 1 tablet by mouth 3 (three) times daily., Disp: , Rfl:    isosorbide mononitrate (IMDUR) 30 MG 24 hr tablet, Take 1 tablet (30 mg  total) by mouth daily., Disp: 30 tablet, Rfl: 3   losartan (COZAAR) 25 MG tablet, TAKE 1 TABLET EVERY DAY, Disp: 90 tablet, Rfl: 0   nitroGLYCERIN (NITROSTAT) 0.4 MG SL tablet, Place 1 tablet (0.4 mg total) under the tongue every 5 (five) minutes as needed for chest pain., Disp: 90 tablet, Rfl: 1   ondansetron (ZOFRAN) 4 MG tablet, Take 4 mg by mouth every 8 (eight) hours as needed for nausea or vomiting., Disp: , Rfl:    pantoprazole (PROTONIX) 40 MG tablet, Take 40 mg by mouth 2 (two) times daily. , Disp: , Rfl:    Peppermint Oil (IBGARD PO), Take by mouth daily at 2 am., Disp: , Rfl:    potassium chloride (KLOR-CON) 10 MEQ tablet, Take 1 tablet (10 mEq total) by mouth daily., Disp: 90 tablet, Rfl: 3   Probiotic Product (PROBIOTIC PO), Take by mouth daily at 8 pm., Disp: , Rfl:    rOPINIRole (REQUIP) 1 MG tablet, Take 1 mg by mouth 2 (two) times daily., Disp: , Rfl:    rosuvastatin (CRESTOR) 40 MG tablet, Take 1 tablet (40 mg total) by mouth daily., Disp: 90 tablet, Rfl: 1   spironolactone (ALDACTONE) 25 MG tablet, Take 1 tablet (25 mg total) by mouth daily., Disp: 90 tablet, Rfl: 0   ticagrelor (BRILINTA) 90 MG TABS tablet, Take 90 mg by mouth 2 (two) times daily., Disp: ,  Rfl:    VASCEPA 1 g capsule, TAKE 2 CAPSULES TWICE DAILY, Disp: 360 capsule, Rfl: 1   vitamin B-12 (CYANOCOBALAMIN) 1000 MCG tablet, Take 1,000 mcg by mouth daily., Disp: , Rfl:   Past Medical History: Past Medical History:  Diagnosis Date   Anxiety    Arthritis    Barrett's esophagus    Bowel obstruction ([place]) 02/2009   small bowel   Chronic chest pain    Coronary artery disease    a. 1993 CABG; b. 2007 Redo CABG; c. 09/2015 Cath: LM mild dzs, LAD 100ost, RI mild dzs, LCX 60p, OM1 100, RCA 100ost, VG->OM2 min irregs, LIMA->LAD nl, VG->RPDA 30ost, 20d, nl EF->Med rx.   Depression    Diverticulitis    Diverticulosis    Erectile dysfunction    Fibromyalgia    Gastric ulcer    Gastritis    GERD (gastroesophageal  reflux disease)    Headache    Hemorrhoids    HFrEF (heart failure with reduced ejection fraction) ([place])    a. 09/2016 Echo: EF 55-60%, no rwma, triv AI, mild MR, mildly dil LA. -> as of April 2020: EF 40 to 45%.  Septal dyssynergy/hypokinesis due to postop state but otherwise unable to assess wall motion [name] due to poor study.   History of myocardial infarct at age less than 72 years    Hyperlipidemia    Hypertension    Kidney stones    going to [facility]  June 26 to see kidney specialist   Lightheadedness    Mass on back    Nephrolithiasis    Orthostatic hypotension    a. Improved after discontinuation of metoprolol.   OSA (obstructive sleep apnea)    a. On CPAP.   Pulmonary embolism ([place]) 01/2006   Post-op/treated   Skin lesions, generalized    Stroke ([place]) 1993   right side weakness no blood thinners   on Aspirin   Syncope and collapse    Urticaria     Tobacco Use: Social History   Tobacco Use  Smoking Status Former   Packs/day: 2.50   Pack years: 0.00   Types: Cigarettes   Quit date: 08/21/1991   Years since quitting: 29.1  Smokeless Tobacco Never    Labs: Recent Review Flowsheet Data     Labs for ITP Cardiac and Pulmonary Rehab Latest Ref Rng & Units 09/24/2015 08/01/2018 12/11/2019 12/13/2019 02/07/2020   Cholestrol 0 - 200 mg/dL - 154 - 181 165   LDLCALC 0 - 99 mg/dL - 56 - 84 61   HDL >40 mg/dL - 41 - 35(L) 41   Trlycerides <150 mg/dL - 285(H) - 312(H) 317(H)   Hemoglobin A1c 4.8 - 5.6 % 5.6 - 6.0(H) - -        Exercise Target Goals: Exercise Program Goal: Individual exercise prescription set using results from initial 6 min walk test and THRR while considering  patient's activity barriers and safety.   Exercise Prescription Goal: Initial exercise prescription builds to 30-45 minutes a day of aerobic activity, 2-3 days per week.  Home exercise guidelines will be given to patient during program as part of exercise prescription that the participant will  acknowledge.   Education: Aerobic Exercise: - Group verbal and visual presentation on the components of exercise prescription. Introduces F.I.T.T principle from ACSM for exercise prescriptions.  Reviews F.I.T.T. principles of aerobic exercise including progression. Written material given at graduation. Flowsheet Row Cardiac Rehab from 09/13/2020 in [facility] Cardiac and Pulmonary Rehab  Education need  identified 07/12/20  Date 08/09/20  Educator AS  Instruction Review Code 1- Verbalizes Understanding       Education: Resistance Exercise: - Group verbal and visual presentation on the components of exercise prescription. Introduces F.I.T.T principle from ACSM for exercise prescriptions  Reviews F.I.T.T. principles of resistance exercise including progression. Written material given at graduation. Flowsheet Row Cardiac Rehab from 09/13/2020 in [facility] Cardiac and Pulmonary Rehab  Date 08/16/20  Educator AS  Instruction Review Code 1- Verbalizes Understanding        Education: Exercise & Equipment Safety: - Individual verbal instruction and demonstration of equipment use and safety with use of the equipment. Flowsheet Row Cardiac Rehab from 09/13/2020 in [facility] Cardiac and Pulmonary Rehab  Date 07/12/20  Educator [facility]  Instruction Review Code 1- Verbalizes Understanding       Education: Exercise Physiology & General Exercise Guidelines: - Group verbal and written instruction with models to review the exercise physiology of the cardiovascular system and associated critical values. Provides general exercise guidelines with specific guidelines to those with heart or lung disease.  Flowsheet Row Cardiac Rehab from 09/13/2020 in [facility] Cardiac and Pulmonary Rehab  Date 08/02/20  Educator [facility]  Instruction Review Code 1- Verbalizes Understanding       Education: Flexibility, Balance, Mind/Body Relaxation: - Group verbal and visual presentation with interactive activity on the components of exercise  prescription. Introduces F.I.T.T principle from ACSM for exercise prescriptions. Reviews F.I.T.T. principles of flexibility and balance exercise training including progression. Also discusses the mind body connection.  Reviews various relaxation techniques to help reduce and manage stress (i.e. Deep breathing, progressive muscle relaxation, and visualization). Balance handout provided to take home. Written material given at graduation.   Activity Barriers & Risk Stratification:  Activity Barriers & Cardiac Risk Stratification - 07/12/20 1027       Activity Barriers & Cardiac Risk Stratification   Activity Barriers Arthritis;Back Problems;Neck/Spine Problems;Assistive Device;Deconditioning;Muscular Weakness;Shortness of Breath;Balance Concerns    Cardiac Risk Stratification High             6 Minute Walk:  6 Minute Walk     Row Name 07/12/20 1026 09/27/20 1201       6 Minute Walk   Phase Initial Discharge    Distance 1080 feet 1465 feet    Distance % Change -- 35.6 %    Distance Feet Change -- 385 ft    Walk Time 6 minutes 6 minutes    # of Rest Breaks 0 0    MPH 2.04 2.78    METS 2.5 2.9    RPE 13 13    Perceived Dyspnea  2 2    VO2 Peak 8.75 10.15    Symptoms Yes (comment) No    Comments 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 HR 81 bpm 74 bpm    Max Ex. BP 174/72 126/74    2 Minute Post BP 134/68 --             Oxygen Initial Assessment:   Oxygen Re-Evaluation:   Oxygen Discharge (Final Oxygen Re-Evaluation):   Initial Exercise Prescription:  Initial Exercise Prescription - 07/12/20 1000       Date of Initial Exercise RX and Referring Provider  Date 07/12/20    Referring Provider End, Harrell Gave MD   Primary Cardiologist: Dr. [name]     Treadmill   MPH 2    Grade 0     Minutes 15    METs 2.53      REL-XR   Level 1    Speed 50    Minutes 15    METs 2      T5 Nustep   Level 1    SPM 80    Minutes 15    METs 2      Prescription Details   Frequency (times per week) 3    Duration Progress to 30 minutes of continuous aerobic without signs/symptoms of physical distress      Intensity   THRR 40-80% of Max Heartrate 98-133    Ratings of Perceived Exertion 11-13    Perceived Dyspnea 0-4      Progression   Progression Continue to progress workloads to maintain intensity without signs/symptoms of physical distress.      Resistance Training   Training Prescription Yes    Weight 4 lb    Reps 10-15             Perform Capillary Blood Glucose checks as needed.  Exercise Prescription Changes:   Exercise Prescription Changes     Row Name 07/12/20 1000 07/18/20 1200 07/31/20 1300 08/15/20 1200 08/29/20 1400     Response to Exercise   Blood Pressure (Admit) 150/72 102/60 128/62 102/60 102/60   Blood Pressure (Exercise) 174/72 138/80 126/60 122/62 130/76   Blood Pressure (Exit) 134/68 118/60 124/64 112/62 124/76   Heart Rate (Admit) 62 bpm 68 bpm 64 bpm 63 bpm 67 bpm   Heart Rate (Exercise) 81 bpm 108 bpm 104 bpm 123 bpm 87 bpm   Heart Rate (Exit) 56 bpm 73 bpm 70 bpm 69 bpm 74 bpm   Oxygen Saturation (Admit) 95 % -- -- -- --   Oxygen Saturation (Exercise) 98 % -- -- -- --   Rating of Perceived Exertion (Exercise) _0 Perceived Dyspnea (Exercise) 2 -- -- -- --   Symptoms R leg weakness, chronic pain 6/10 -- none none none   Comments walk test results 2nd day -- -- --   Duration -- Continue with 30 min of aerobic exercise without signs/symptoms of physical distress. Continue with 30 min of aerobic exercise without signs/symptoms of physical distress. Continue with 30 min of aerobic exercise without signs/symptoms of physical distress. Continue with 30 min of aerobic exercise without signs/symptoms of physical distress.   Intensity  -- THRR unchanged THRR unchanged THRR unchanged THRR unchanged     Progression   Progression -- Continue to progress workloads to maintain intensity without signs/symptoms of physical distress. Continue to progress workloads to maintain intensity without signs/symptoms of physical distress. Continue to progress workloads to maintain intensity without signs/symptoms of physical distress. Continue to progress workloads to maintain intensity without signs/symptoms of physical distress.   Average METs -- 2.4 3.77 2.7 2.76     Resistance Training   Training Prescription -- Yes Yes Yes Yes   Weight -- 4 lb 4 lb 5 lb 5 lb   Reps -- 10-15 10-15 10-15 10-15     Interval Training   Interval Training -- -- No No No     Treadmill   MPH -- 2.1 2.4 2.4 --   Grade -- 0 0 0 --   Minutes -- _1 --  METs -- 2.61 2.84 2.84 --     REL-XR   Level -- -- 6 -- 7   Minutes -- -- 15 -- 15   METs -- -- 4.7 -- 3.6     T5 Nustep   Level -- _0 SPM -- 80 -- -- --   Minutes -- _1 METs -- 2.2 -- 2.5 2.1     Track   Laps -- -- -- -- 30   Minutes -- -- -- -- 15   METs -- -- -- -- 2.6    Row Name 09/11/20 1100 09/26/20 1400           Response to Exercise   Blood Pressure (Admit) 112/62 144/82      Blood Pressure (Exercise) 124/62 128/60      Blood Pressure (Exit) 124/62 96/62      Heart Rate (Admit) 65 bpm 59 bpm      Heart Rate (Exercise) 79 bpm 80 bpm      Heart Rate (Exit) 72 bpm 67 bpm      Rating of Perceived Exertion (Exercise) 13 14      Symptoms none none      Duration Continue with 30 min of aerobic exercise without signs/symptoms of physical distress. Continue with 30 min of aerobic exercise without signs/symptoms of physical distress.      Intensity THRR unchanged THRR unchanged             Progression      Progression Continue to progress workloads to maintain intensity without signs/symptoms of physical distress. Continue to progress workloads to maintain  intensity without signs/symptoms of physical distress.      Average METs 3.35 3             Resistance Training      Training Prescription Yes Yes      Weight 5 lb 5 lb      Reps 10-15 10-15             Interval Training      Interval Training No No             Recumbant Bike      Level -- 10      Watts -- 36      Minutes -- 15      METs -- 3.22             NuStep      Level -- 5      Minutes -- 15      METs -- 3.4             REL-XR      Level 7 --      Minutes 15 --      METs 4.1 --             T5 Nustep      Level -- 5      Minutes -- 15      METs -- 2.2             Track      Laps 30 43      Minutes 15 15      METs 2.7 3.4              Exercise Comments:   Exercise Comments     Row Name 07/14/20 0955 10/02/20 1357         Exercise Comments First full  day of exercise!  Patient was oriented to gym and equipment including functions, settings, policies, and procedures.  Patient's individual exercise prescription and treatment plan were reviewed.  All starting workloads were established based on the results of the 6 minute walk test done at initial orientation visit.  The plan for exercise progression was also introduced and progression will be customized based on patient's performance and goals. [name] graduated today from  rehab with 36 sessions completed.  Details of the patient's exercise prescription and what He needs to do in order to continue the prescription and progress were discussed with patient.  Patient was given a copy of prescription and goals.  Patient verbalized understanding.  [name]  plans to continue to exercise by exercising at home.               Exercise Goals and Review:   Exercise Goals     Row Name 07/12/20 1029             Exercise Goals   Increase Physical Activity Yes       Intervention Provide advice, education, support and counseling about physical activity/exercise needs.;Develop an individualized exercise  prescription for aerobic and resistive training based on initial evaluation findings, risk stratification, comorbidities and participant's personal goals.       Expected Outcomes Short Term: Attend rehab on a regular basis to increase amount of physical activity.;Long Term: Add in home exercise to make exercise part of routine and to increase amount of physical activity.;Long Term: Exercising regularly at least 3-5 days a week.       Increase Strength and Stamina Yes       Intervention Provide advice, education, support and counseling about physical activity/exercise needs.;Develop an individualized exercise prescription for aerobic and resistive training based on initial evaluation findings, risk stratification, comorbidities and participant's personal goals.       Expected Outcomes Short Term: Increase workloads from initial exercise prescription for resistance, speed, and METs.;Short Term: Perform resistance training exercises routinely during rehab and add in resistance training at home;Long Term: Improve cardiorespiratory fitness, muscular endurance and strength as measured by increased METs and functional capacity (6MWT)       Able to understand and use rate of perceived exertion (RPE) scale Yes       Intervention Provide education and explanation on how to use RPE scale       Expected Outcomes Short Term: Able to use RPE daily in rehab to express subjective intensity level;Long Term:  Able to use RPE to guide intensity level when exercising independently       Able to understand and use Dyspnea scale Yes       Intervention Provide education and explanation on how to use Dyspnea scale       Expected Outcomes Short Term: Able to use Dyspnea scale daily in rehab to express subjective sense of shortness of breath during exertion;Long Term: Able to use Dyspnea scale to guide intensity level when exercising independently       Knowledge and understanding of Target Heart Rate Range (THRR) Yes        Intervention Provide education and explanation of THRR including how the numbers were predicted and where they are located for reference       Expected Outcomes Short Term: Able to state/look up THRR;Long Term: Able to use THRR to govern intensity when exercising independently;Short Term: Able to use daily as guideline for intensity in rehab       Able to check  pulse independently Yes       Intervention Provide education and demonstration on how to check pulse in carotid and radial arteries.;Review the importance of being able to check your own pulse for safety during independent exercise       Expected Outcomes Short Term: Able to explain why pulse checking is important during independent exercise;Long Term: Able to check pulse independently and accurately       Understanding of Exercise Prescription Yes       Intervention Provide education, explanation, and written materials on patient's individual exercise prescription       Expected Outcomes Short Term: Able to explain program exercise prescription;Long Term: Able to explain home exercise prescription to exercise independently                Exercise Goals Re-Evaluation :  Exercise Goals Re-Evaluation     Row Name 07/14/20 0955 07/18/20 1252 07/28/20 1002 07/31/20 1350 08/15/20 1220     Exercise Goal Re-Evaluation   Exercise Goals Review Able to understand and use rate of perceived exertion (RPE) scale;Able to understand and use Dyspnea scale;Understanding of Exercise Prescription;Knowledge and understanding of Target Heart Rate Range (THRR) Increase Physical Activity;Increase Strength and Stamina Increase Physical Activity;Increase Strength and Stamina;Understanding of Exercise Prescription Increase Physical Activity;Increase Strength and Stamina;Understanding of Exercise Prescription --   Comments Reviewed RPE and dyspnea scales, THR and program prescription with pt today.  Pt voiced understanding and was given a copy of goals to take  home. [name] is teolerating exercise well and has increased to level 4 on T5 NS.  Staff will monitor progress. Patient is using the treadmill at home twice a week. He is going to start walking the dog for exercise too. patient is going to have home exercise reviewed soon. [name] is doing well in rehab.  He is now on level 6 for the XR.  We will continue to monitor his progress. [name] continues to progress well. He has increased to 5 lb for strength work and to level 4 on T5.   Expected Outcomes Short: Use RPE daily to regulate intensity. Long: Follow program prescription in THR. Short:  attend consistently Long:  improve overall MET level Short: get home exercise review. Long: maintain exercise independently at home. Short: Review home exercise guidelines Long: Continue to improve stamina Short:    Row Name 08/15/20 1222 08/25/20 1017 08/29/20 1421 09/11/20 1128 09/11/20 1135     Exercise Goal Re-Evaluation   Exercise Goals Review Increase Physical Activity;Increase Strength and Stamina Increase Physical Activity;Increase Strength and Stamina Increase Physical Activity;Increase Strength and Stamina;Understanding of Exercise Prescription Increase Physical Activity;Increase Strength and Stamina --   Comments -- Uses treadmill twice a week at home. He is going to workout at home when he is finished with the program. [name] has been doing well in rehab.  He is up to level 6 on the T5 NuStep and level 7 on the XR.  We will continue to monitor his progress. [name] is progressing well and has improved MET level.  Oxygen has stayed 90% and up.  Staff will monitor progress. --   Expected Outcomes try incline on TM Long: continue to build stamina Short: continue to workout at home. Long: add more days working out at home. Short: Review home exercise guidelines Long: Continue to improve stamina. Short: review home exercise Long:  continue to build stamina    Row Name 09/25/20 1016 09/26/20 1447           Exercise  Goal Re-Evaluation   Exercise Goals Review Increase Physical Activity;Increase Strength and Stamina Increase Physical Activity;Increase Strength and Stamina;Understanding of Exercise Prescription      Comments [name] is doing well with exercise and walked a mile on the track!  Oxygen stays in [place] is doing well in rehab.  He is up to a mile walking in class.  He is also up to 36 watts on the bike.  He is due for his post 6MWT this walk test and we expect to see improvement.  We will continue to monitor his progress.      Expected Outcomes Short: Short: Improve post 6MWT Long: Continue to exercise routinely               Discharge Exercise Prescription (Final Exercise Prescription Changes):  Exercise Prescription Changes - 09/26/20 1400       Response to Exercise   Blood Pressure (Admit) 144/82    Blood Pressure (Exercise) 128/60    Blood Pressure (Exit) 96/62    Heart Rate (Admit) 59 bpm    Heart Rate (Exercise) 80 bpm    Heart Rate (Exit) 67 bpm    Rating of Perceived Exertion (Exercise) 14    Symptoms none    Duration Continue with 30 min of aerobic exercise without signs/symptoms of physical distress.    Intensity THRR unchanged      Progression   Progression Continue to progress workloads to maintain intensity without signs/symptoms of physical distress.    Average METs 3      Resistance Training   Training Prescription Yes    Weight 5 lb    Reps 10-15      Interval Training   Interval Training No      Recumbant Bike   Level 10    Watts 36    Minutes 15    METs 3.22      NuStep   Level 5    Minutes 15    METs 3.4      T5 Nustep   Level 5    Minutes 15    METs 2.2      Track   Laps 43    Minutes 15    METs 3.4             Nutrition:  Target Goals: Understanding of nutrition guidelines, daily intake of sodium <1555m, cholesterol <2041m calories 30% from fat and 7% or less from saturated fats, daily to have 5 or more servings of fruits and  vegetables.  Education: All About Nutrition: -Group instruction provided by verbal, written material, interactive activities, discussions, models, and posters to present general guidelines for heart healthy nutrition including fat, fiber, MyPlate, the role of sodium in heart healthy nutrition, utilization of the nutrition label, and utilization of this knowledge for meal planning. Follow up email sent as well. Written material given at graduation. Flowsheet [facility]  Education need identified 07/12/20       Biometrics:  Pre Biometrics - 07/12/20 1030       Pre Biometrics   Height 5' 9.8" (1.773 m)    Weight 197 lb 3.2 oz (89.4 kg)    BMI (Calculated) 28.45    Single Leg Stand 4.7 seconds              Nutrition Therapy Plan and Nutrition Goals:  Nutrition Therapy & Goals - 07/24/20 0853       Nutrition Therapy   Fiber  30 grams    Whole Grain Foods 3 servings    Saturated Fats 12 max. grams    Fruits and Vegetables 8 servings/day    Sodium 1.5 grams      Personal Nutrition Goals   Nutrition Goal ST: LT:    Comments B: cheese and egg sandwich or bowl of grits L: salads with grilled chicken for lunch (romaine lettuce) - take out with baked potato D: orange or apple with peanut butter. He reports he fluctuates 2-3 lbs 191.4 lbs (at home). Drinks: water, coffee in am and pm with cream and sugar Discussed heart healthy eating. He will have beans 1-2x/week. He can't eat onions due to sensistivity.      Intervention Plan   Intervention Prescribe, educate and counsel regarding individualized specific dietary modifications aiming towards targeted core components such as weight, hypertension, lipid management, diabetes, heart failure and other comorbidities.;Nutrition handout(s) given to patient.    Expected Outcomes Short Term Goal: Understand basic principles of dietary content, such as calories, fat, sodium, cholesterol  and nutrients.;Short Term Goal: A plan has been developed with personal nutrition goals set during dietitian appointment.;Long Term Goal: Adherence to prescribed nutrition plan.             Nutrition Assessments:  MEDIFICTS Score Key: ?70 Need to make dietary changes  40-70 Heart Healthy Diet ? 40 Therapeutic Level Cholesterol Diet  Flowsheet Row Cardiac Rehab from 07/12/2020 in [facility] Cardiac and Pulmonary Rehab  Picture Your Plate Total Score on Admission 70      Picture Your Plate Scores: <38 Unhealthy dietary pattern with much room for improvement. 41-50 Dietary pattern unlikely to meet recommendations for good health and room for improvement. 51-60 More healthful dietary pattern, with some room for improvement.  >60 Healthy dietary pattern, although there may be some specific behaviors that could be improved.    Nutrition Goals Re-Evaluation:  Nutrition Goals Re-Evaluation     [place] Name 08/25/20 1020 09/25/20 1018           Goals   Current Weight 202 lb (91.6 kg) --      Nutrition Goal Lose weight --      Comment Patient is not sure if he is gaining weight due to fluid or his eating habits. He is going to try to figure out what he is eating to keep his weight on. [name] feel she is still gaining weight.  He eats salad with lots of veggies.  We reviewed checking nutrition info on dressing.  He very seldon adds salt to food.      Expected Outcome Short: watch out for fatty foods. Long: maintain a healthy balanced diet that pertains to his needs. Short:  eat balanced amount of carbs fat and protein as recommended by RD Long:  manage weight on his own               Nutrition Goals Discharge (Final Nutrition Goals Re-Evaluation):  Nutrition Goals Re-Evaluation - 09/25/20 1018       Goals   Comment [name] feel she is still gaining weight.  He eats salad with lots of veggies.  We reviewed checking nutrition info on dressing.  He very seldon adds salt to food.    Expected  Outcome Short:  eat balanced amount of carbs fat and protein as recommended by RD Long:  manage weight on his own             Psychosocial: Target Goals: Acknowledge presence or absence of significant  depression and/or stress, maximize coping skills, provide positive support system. Participant is able to verbalize types and ability to use techniques and skills needed for reducing stress and depression.   Education: Stress, Anxiety, and Depression - Group verbal and visual presentation to define topics covered.  Reviews how body is impacted by stress, anxiety, and depression.  Also discusses healthy ways to reduce stress and to treat/manage anxiety and depression.  Written material given at graduation.   Education: Sleep Hygiene -Provides group verbal and written instruction about how sleep can affect your health.  Define sleep hygiene, discuss sleep cycles and impact of sleep habits. Review good sleep hygiene tips.    Initial Review & Psychosocial Screening:  Initial Psych Review & Screening - 07/10/20 1537       Initial Review   Current issues with Current Stress Concerns    Source of Stress Concerns Unable to participate in former interests or hobbies;Unable to perform yard/household activities      [place]? Yes   wife     Barriers   Psychosocial barriers to participate in program There are no identifiable barriers or psychosocial needs.      Screening Interventions   Interventions Encouraged to exercise;To provide support and resources with identified psychosocial needs;Provide feedback about the scores to participant    Expected Outcomes Short Term goal: Utilizing psychosocial counselor, staff and physician to assist with identification of specific Stressors or current issues interfering with healing process. Setting desired goal for each stressor or current issue identified.;Long Term Goal: Stressors or current issues are controlled or  eliminated.;Short Term goal: Identification and review with participant of any Quality of Life or Depression concerns found by scoring the questionnaire.;Long Term goal: The participant improves quality of Life and PHQ9 Scores as seen by post scores and/or verbalization of changes             Quality of Life Scores:   Quality of Life - 07/12/20 1030       Quality of Life   Select Quality of Life      Quality of Life Scores   Health/Function Pre 19.47 %    Socioeconomic Pre 20.17 %    Psych/Spiritual Pre 23.93 %    Family Pre 22.33 %    GLOBAL Pre 20.89 %            Scores of 19 and below usually indicate a poorer quality of life in these areas.  A difference of  2-3 points is a clinically meaningful difference.  A difference of 2-3 points in the total score of the Quality of Life Index has been associated with significant improvement in overall quality of life, self-image, physical symptoms, and general health in studies assessing change in quality of life.  PHQ-9: Recent Review Flowsheet Data     Depression screen [facility] 2/9 08/16/2020 07/12/2020   Decreased Interest 0 3   Down, Depressed, Hopeless 0 1   PHQ - 2 Score 0 4   Altered sleeping 3 1   Tired, decreased energy 3 2   Change in appetite 1 0   Feeling bad or failure about yourself  0 0   Trouble concentrating 3 0   Moving slowly or fidgety/restless 0 1   Suicidal thoughts 0 0   PHQ-9 Score 10 8   Difficult doing work/chores Somewhat difficult Somewhat difficult      Interpretation of Total Score  Total Score Depression Severity:  1-4 = Minimal depression,  5-9 = Mild depression, 10-14 = Moderate depression, 15-19 = Moderately severe depression, 20-27 = Severe depression   Psychosocial Evaluation and Intervention:  Psychosocial Evaluation - 07/10/20 1544       Psychosocial Evaluation & Interventions   Interventions Encouraged to exercise with the program and follow exercise prescription;Stress management  education;Relaxation education    Comments Mr. 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 He has done the program before throughout the years so he is familiar with the concept. His breathing makes it hard to do things around the house. He also suffers from arthritis all over his body which he receives injections for, so he is limited on his physical activity. His doctor is aware of his breathing issues and thinks this program will help him. He states besides these current stated issues he isn't experiencing any more stress than usual.    Expected Outcomes Short; attend cardiac rehab for education and exercise. Long: develop positive self care behaviors.    Continue Psychosocial Services  Follow up required by staff             Psychosocial Re-Evaluation:  Psychosocial Re-Evaluation     [place] Name 07/28/20 1010 08/16/20 1044 09/25/20 1020         Psychosocial Re-Evaluation   Current issues with None Identified History of Depression;Current Sleep Concerns Current Sleep Concerns;Current Stress Concerns     Comments Patient reports no issues with their current mental states, sleep, stress, depression or anxiety. Will follow up with patient in a few weeks for any changes. Reviewed patient health questionnaire (PHQ-9) with patient for follow up. Previously, patients score indicated signs/symptoms of depression.  Reviewed to see if patient is improving symptom wise while in program.  Score declined and patient states that it is because he has not been able to sleep well since his wife snores and he has to use the bathroom alot at night. He is going to see his cardiologist and is going to inform them of his energy level. [name] still doesnt sleep well.  He has a hard time going back to sleep when he wakes up.  He does use ear plugs at times.     Expected Outcomes Short: Continue to exercise regularly to support mental health and notify staff of any  changes. Long: maintain mental health and well being through teaching of rehab or prescribed medications independently. Short: Continue to work toward an improvement in [place] scores by attending [facility] regularly. Long: Continue to improve stress and depression coping skills by talking with staff and attending [facility] regularly and work toward a positive mental state. Short:continue to use ear plugs Long:  develop better sleep patterns     Interventions Encouraged to attend Cardiac Rehabilitation for the exercise Encouraged to attend Cardiac Rehabilitation for the exercise --     Continue Psychosocial Services  Follow up required by staff Follow up required by staff --              Psychosocial Discharge (Final Psychosocial Re-Evaluation):  Psychosocial Re-Evaluation - 09/25/20 1020       Psychosocial Re-Evaluation   Current issues with Current Sleep Concerns;Current Stress Concerns    Comments [name] still doesnt sleep well.  He has a hard time going back to sleep when he wakes up.  He does use ear plugs at times.    Expected Outcomes Short:continue to use ear plugs Long:  develop better sleep patterns  Vocational Rehabilitation: Provide vocational rehab assistance to qualifying candidates.   Vocational Rehab Evaluation & Intervention:  Vocational Rehab - 07/10/20 1537       Initial Vocational Rehab Evaluation & Intervention   Assessment shows need for Vocational Rehabilitation No             Education: Education Goals: Education classes will be provided on a variety of topics geared toward better understanding of heart health and risk factor modification. Participant will state understanding/return demonstration of topics presented as noted by education test scores.  Learning Barriers/Preferences:  Learning Barriers/Preferences - 07/10/20 1536       Learning Barriers/Preferences   Learning Barriers None    Learning Preferences None              General Cardiac Education Topics:  AED/CPR: - Group verbal and written instruction with the use of models to demonstrate the basic use of the AED with the basic ABC's of resuscitation.   Anatomy and Cardiac Procedures: - Group verbal and visual presentation and models provide information about basic cardiac anatomy and function. Reviews the testing methods done to diagnose heart disease and the outcomes of the test results. Describes the treatment choices: Medical Management, Angioplasty, or Coronary Bypass Surgery for treating various heart conditions including Myocardial Infarction, Angina, Valve Disease, and Cardiac Arrhythmias.  Written material given at graduation. Flowsheet Row Cardiac Rehab from 09/13/2020 in [facility] Cardiac and Pulmonary Rehab  Education need identified 07/12/20  Date 08/16/20  Educator SB  Instruction Review Code 1- Verbalizes Understanding       Medication Safety: - Group verbal and visual instruction to review commonly prescribed medications for heart and lung disease. Reviews the medication, class of the drug, and side effects. Includes the steps to properly store meds and maintain the prescription regimen.  Written material given at graduation. Flowsheet Row Cardiac Rehab from 09/13/2020 in [facility] Cardiac and Pulmonary Rehab  Date 09/06/20  Educator [facility]  Instruction Review Code 1- Verbalizes Understanding       Intimacy: - Group verbal instruction through game format to discuss how heart and lung disease can affect sexual intimacy. Written material given at graduation.. Flowsheet Row Cardiac Rehab from 09/13/2020 in [facility] Cardiac and Pulmonary Rehab  Date 08/09/20  Educator AS  Instruction Review Code 1- Verbalizes Understanding       Know Your Numbers and Heart Failure: - Group verbal and visual instruction to discuss disease risk factors for cardiac and pulmonary disease and treatment options.  Reviews associated critical values for  Overweight/Obesity, Hypertension, Cholesterol, and Diabetes.  Discusses basics of heart failure: signs/symptoms and treatments.  Introduces Heart Failure Zone chart for action plan for heart failure.  Written material given at graduation. Flowsheet Row Cardiac Rehab from 09/13/2020 in [facility] Cardiac and Pulmonary Rehab  Date 09/13/20  Educator [facility]  Instruction Review Code 1- Verbalizes Understanding       Infection Prevention: - Provides verbal and written material to individual with discussion of infection control including proper hand washing and proper equipment cleaning during exercise session. Flowsheet Row Cardiac Rehab from 09/13/2020 in [facility] Cardiac and Pulmonary Rehab  Date 07/12/20  Educator [facility]  Instruction Review Code 1- Verbalizes Understanding       Falls Prevention: - Provides verbal and written material to individual with discussion of falls prevention and safety. Flowsheet Row Cardiac Rehab from 09/13/2020 in [facility] Cardiac and Pulmonary Rehab  Date 07/12/20  Educator [facility]  Instruction Review Code 1- Verbalizes Understanding  Other: -Provides group and verbal instruction on various topics (see comments)   Knowledge Questionnaire Score:  Knowledge Questionnaire Score - 07/12/20 1031       Knowledge Questionnaire Score   Pre Score 21/26 Education Focus: Angina, sex, nutrition, exercise             Core Components/Risk Factors/Patient Goals at Admission:  Personal Goals and Risk Factors at Admission - 07/12/20 1032       Core Components/Risk Factors/Patient Goals on Admission    Weight Management Yes;Weight Loss    Intervention Weight Management: Develop a combined nutrition and exercise program designed to reach desired caloric intake, while maintaining appropriate intake of nutrient and fiber, sodium and fats, and appropriate energy expenditure required for the weight goal.;Weight Management: Provide education and appropriate resources to help participant  work on and attain dietary goals.;Weight Management/Obesity: Establish reasonable short term and long term weight goals.    Admit Weight 197 lb 3.2 oz (89.4 kg)    Goal Weight: Short Term 192 lb (87.1 kg)    Goal Weight: Long Term 185 lb (83.9 kg)    Expected Outcomes Short Term: Continue to assess and modify interventions until short term weight is achieved;Long Term: Adherence to nutrition and physical activity/exercise program aimed toward attainment of established weight goal;Weight Loss: Understanding of general recommendations for a balanced deficit meal plan, which promotes 1-2 lb weight loss per week and includes a negative energy balance of [phone] kcal/d;Understanding recommendations for meals to include 15-35% energy as protein, 25-35% energy from fat, 35-60% energy from carbohydrates, less than 259m of dietary cholesterol, 20-35 gm of total fiber daily;Understanding of distribution of calorie intake throughout the day with the consumption of 4-5 meals/snacks    Heart Failure Yes    Intervention Provide a combined exercise and nutrition program that is supplemented with education, support and counseling about heart failure. Directed toward relieving symptoms such as shortness of breath, decreased exercise tolerance, and extremity edema.    Expected Outcomes Improve functional capacity of life;Short term: Attendance in program 2-3 days a week with increased exercise capacity. Reported lower sodium intake. Reported increased fruit and vegetable intake. Reports medication compliance.;Short term: Daily weights obtained and reported for increase. Utilizing diuretic protocols set by physician.;Long term: Adoption of self-care skills and reduction of barriers for early signs and symptoms recognition and intervention leading to self-care maintenance.    Hypertension Yes    Intervention Provide education on lifestyle modifcations including regular physical activity/exercise, weight management, moderate  sodium restriction and increased consumption of fresh fruit, vegetables, and low fat dairy, alcohol moderation, and smoking cessation.;Monitor prescription use compliance.    Expected Outcomes Short Term: Continued assessment and intervention until BP is < 140/920mHG in hypertensive participants. < 130/8021mG in hypertensive participants with diabetes, heart failure or chronic kidney disease.;Long Term: Maintenance of blood pressure at goal levels.    Lipids Yes    Intervention Provide education and support for participant on nutrition & aerobic/resistive exercise along with prescribed medications to achieve LDL <2m4mDL >40mg18m Expected Outcomes Short Term: Participant states understanding of desired cholesterol values and is compliant with medications prescribed. Participant is following exercise prescription and nutrition guidelines.;Long Term: Cholesterol controlled with medications as prescribed, with individualized exercise RX and with personalized nutrition plan. Value goals: LDL < 2mg,33m > 40 mg.             Education:Diabetes - Individual verbal and written instruction to review signs/symptoms of diabetes, desired  ranges of glucose level fasting, after meals and with exercise. Acknowledge that pre and post exercise glucose checks will be done for 3 sessions at entry of program.   Core Components/Risk Factors/Patient Goals Review:   Goals and Risk Factor Review     Row Name 07/28/20 1005 08/25/20 1018 09/25/20 1012         Core Components/Risk Factors/Patient Goals Review   Personal Goals Review Weight Management/Obesity;Hypertension Weight Management/Obesity Weight Management/Obesity     Review [name] is working on losing weight. He would like to reach a weight goal of 175lbs. He has been taking his blood pressure at home every morning. He has been 120's over 60's on a routine basis. Sometimes his blood pressure gets low and he can feel it. [name] thinks that his weight is up  due to fluid. He is going to talk to his Cardiologist at 2pm today. [name] will inform the staff of any medication changes. [name] reports taking meds as directed.  He feels like his weight is up.  He does take fluid pills.  He has spoken with his Dr about this.  He takes higher does if he has too much fluid.     Expected Outcomes Short: monitor blood pressure. Long: inform doctor of lower readings. Short: go to Cardiologist appointment. Long: maintain weight and fluid medication independently. Short: continue to communicate with Dr about fluid and SOB Long : manage risk factors long term              Core Components/Risk Factors/Patient Goals at Discharge (Final Review):   Goals and Risk Factor Review - 09/25/20 1012       Core Components/Risk Factors/Patient Goals Review   Personal Goals Review Weight Management/Obesity    Review [name] reports taking meds as directed.  He feels like his weight is up.  He does take fluid pills.  He has spoken with his Dr about this.  He takes higher does if he has too much fluid.    Expected Outcomes Short: continue to communicate with Dr about fluid and SOB Long : manage risk factors long term             ITP Comments:  ITP Comments     Row Name 07/10/20 1550 07/12/20 1026 07/14/20 0955 08/09/20 0635 09/06/20 0805   ITP Comments Initial telephone orientation completed. Diagnosis can be found in [facility] 3/1. EP orientation scheduled for Wed 3/23 at 9:30 Completed 6MWT and gym orientation. Initial ITP created and sent for review to Dr. Emily Filbert, Medical Director. First full day of exercise!  Patient was oriented to gym and equipment including functions, settings, policies, and procedures.  Patient's individual exercise prescription and treatment plan were reviewed.  All starting workloads were established based on the results of the 6 minute walk test done at initial orientation visit.  The plan for exercise progression was also introduced and progression  will be customized based on patient's performance and goals. 30 Day review completed. Medical Director ITP review done, changes made as directed, and signed approval by Medical Director. 30 Day review completed. Medical Director ITP review done, changes made as directed, and signed approval by Medical Director.    Row Name 10/02/20 1357           ITP Comments [name] graduated today from  rehab with 36 sessions completed.  Details of the patient's exercise prescription and what He needs to do in order to continue the prescription and progress were discussed with patient.  Patient was given a copy of prescription and goals.  Patient verbalized understanding.  [name]  plans to continue to exercise by exercising at home.                Comments: Discharge ITP

## 2020-10-02 NOTE — Progress Notes (Signed)
Daily Session Note  Patient Details  Name: Willie Clark MRN: [id number] Date of Birth: Apr 17, 1951 Referring Provider:   Flowsheet Row Cardiac Rehab from 07/12/2020 in [facility] Cardiac and Pulmonary Rehab  Referring Provider End, Harrell Gave MD  [facility] Cardiologist: Dr. Rogue Jury Arida]       Encounter Date: 10/02/2020  Check In:  Session Check In - 10/02/20 1354       Check-In   Supervising physician immediately available to respond to emergencies See telemetry face sheet for immediately available ER MD    Location [facility]-Cardiac & Pulmonary Rehab    Staff Present Heath Lark, RN, BSN, CCRP;Joseph Hood RCP,RRT,BSRT;Kelly Doyle, [place], ACSM CEP, Exercise Physiologist    Virtual Visit No    Medication changes reported     No    Fall or balance concerns reported    No    Warm-up and Cool-down Performed on first and last piece of equipment    Resistance Training Performed Yes    VAD Patient? No    PAD/SET Patient? No      Pain Assessment   Currently in Pain? No/denies                Social History   Tobacco Use  Smoking Status Former   Packs/day: 2.50   Pack years: 0.00   Types: Cigarettes   Quit date: 08/21/1991   Years since quitting: 29.1  Smokeless Tobacco Never    Goals Met:  Independence with exercise equipment Exercise tolerated well Personal goals reviewed No report of cardiac concerns or symptoms  Goals Unmet:  Not Applicable  Comments:  Willie Clark graduated today from  rehab with 36 sessions completed.  Details of the patient's exercise prescription and what He needs to do in order to continue the prescription and progress were discussed with patient.  Patient was given a copy of prescription and goals.  Patient verbalized understanding.  Willie Clark  plans to continue to exercise by exercising at home.    Dr. Emily Filbert is Medical Director for [place].  Dr. Ottie Glazier is Medical Director for [facility] Pulmonary Rehabilitation.

## 2020-10-02 NOTE — Progress Notes (Signed)
Discharge Progress Report  Patient Details  Name: Willie Clark MRN: [id number] Date of Birth: 10/20/1950 Referring Provider:   Flowsheet Row Cardiac Rehab from 07/12/2020 in [facility] Cardiac [facility] Pul[facility]  Referring Provider End, Harrell Gave MD  [facility] Cardiologist: Dr. Rogue Jury Arida]        Number of Visits: 36  Reason for Discharge:  Patient reached a stable level of exercise. Patient independent in their exercise.  Smoking History:  Social History   Tobacco Use  Smoking Status Former   Packs/day: 2.50   Pack years: 0.00   Types: Cigarettes   Quit date: 08/21/1991   Years since quitting: 29.1  Smokeless Tobacco Never    Diagnosis:  NSTEMI (non-ST elevated myocardial infarction) ([place])  Status post coronary artery stent placement    Initial Exercise Prescription:  Initial Exercise Prescription - 07/12/20 1000       Date of Initial Exercise RX and Referring Provider   Date 07/12/20    Referring Provider End, Christopher MD   Primary Cardiologist: Dr. [name]     Treadmill   MPH 2    Grade 0    Minutes 15    METs 2.53      REL-XR   Level 1    Speed 50    Minutes 15    METs 2      T5 Nustep   Level 1    SPM 80    Minutes 15    METs 2      Prescription Details   Frequency (times per week) 3    Duration Progress to 30 minutes of continuous aerobic without signs/symptoms of physical distress      Intensity   THRR 40-80% of Max Heartrate 98-133    Ratings of Perceived Exertion 11-13    Perceived Dyspnea 0-4      Progression   Progression Continue to progress workloads to maintain intensity without signs/symptoms of physical distress.      Resistance Training   Training Prescription Yes    Weight 4 lb    Reps 10-15             Discharge Exercise Prescription (Final Exercise Prescription Changes):  Exercise Prescription Changes - 09/26/20 1400       Response to Exercise   Blood Pressure (Admit) 144/82    Blood Pressure  (Exercise) 128/60    Blood Pressure (Exit) 96/62    Heart Rate (Admit) 59 bpm    Heart Rate (Exercise) 80 bpm    Heart Rate (Exit) 67 bpm    Rating of Perceived Exertion (Exercise) 14    Symptoms none    Duration Continue with 30 min of aerobic exercise without signs/symptoms of physical distress.    Intensity THRR unchanged      Progression   Progression Continue to progress workloads to maintain intensity without signs/symptoms of physical distress.    Average METs 3      Resistance Training   Training Prescription Yes    Weight 5 lb    Reps 10-15      Interval Training   Interval Training No      Recumbant Bike   Level 10    Watts 36    Minutes 15    METs 3.22      NuStep   Level 5    Minutes 15    METs 3.4      T5 Nustep   Level 5    Minutes 15    METs  2.2      Track   Laps 43    Minutes 15    METs 3.4             Functional Capacity:  6 Minute Walk     Row Name 07/12/20 1026 09/27/20 1201       6 Minute Walk   Phase Initial Discharge    Distance 1080 feet 1465 feet    Distance % Change -- 35.6 %    Distance Feet Change -- 385 ft    Walk Time 6 minutes 6 minutes    # of Rest Breaks 0 0    MPH 2.04 2.78    METS 2.5 2.9    RPE 13 13    Perceived Dyspnea  2 2    VO2 Peak 8.75 10.15    Symptoms Yes (comment) No    Comments 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 HR 81 bpm 74 bpm    Max Ex. BP 174/72 126/74    2 Minute Post BP 134/68 --             Psychological, QOL, Others - Outcomes: PHQ 2/9: Depression screen [facility] 2/9 08/16/2020 07/12/2020  Decreased Interest 0 3  Down, Depressed, Hopeless 0 1  PHQ - 2 Score 0 4  Altered sleeping 3 1  Tired, decreased energy 3 2  Change in appetite 1 0  Feeling bad or failure about yourself  0 0  Trouble concentrating 3 0   Moving slowly or fidgety/restless 0 1  Suicidal thoughts 0 0  PHQ-9 Score 10 8  Difficult doing work/chores Somewhat difficult Somewhat difficult  Some recent data might be hidden   Nutrition & Weight - Outcomes:  Pre Biometrics - 07/12/20 1030       Pre Biometrics   Height 5' 9.8" (1.773 m)    Weight 197 lb 3.2 oz (89.4 kg)    BMI (Calculated) 28.45    Single Leg Stand 4.7 seconds            Discharge weight 201.8   Nutrition:  Nutrition Therapy & Goals - 07/24/20 0853       Nutrition Therapy   Fiber 30 grams    Whole Grain Foods 3 servings    Saturated Fats 12 max. grams    Fruits and Vegetables 8 servings/day    Sodium 1.5 grams      Personal Nutrition Goals   Nutrition Goal ST: LT:    Comments B: cheese and egg sandwich or bowl of grits L: salads with grilled chicken for lunch (romaine lettuce) - take out with baked potato D: orange or apple with peanut butter. He reports he fluctuates 2-3 lbs 191.4 lbs (at home). Drinks: water, coffee in am and pm with cream and sugar Discussed heart healthy eating. He will have beans 1-2x/week. He can't eat onions due to sensistivity.      Intervention Plan   Intervention Prescribe, educate and counsel regarding individualized specific dietary modifications aiming towards targeted core components such as weight, hypertension, lipid management, diabetes, heart failure and other comorbidities.;Nutrition handout(s) given to patient.    Expected Outcomes Short Term Goal: Understand basic principles of dietary content, such as calories, fat,  sodium, cholesterol and nutrients.;Short Term Goal: A plan has been developed with personal nutrition goals set during dietitian appointment.;Long Term Goal: Adherence to prescribed nutrition plan.                Goals reviewed with patient; copy given to patient.

## 2020-10-03 ENCOUNTER — Other Ambulatory Visit: Payer: Self-pay | Admitting: Cardiovascular Disease

## 2020-10-04 ENCOUNTER — Other Ambulatory Visit: Payer: Self-pay | Admitting: Medical

## 2020-10-04 DIAGNOSIS — I5042 Chronic combined systolic (congestive) and diastolic (congestive) heart failure: Secondary | ICD-10-CM

## 2020-10-25 ENCOUNTER — Telehealth: Payer: Self-pay | Admitting: Cardiovascular Disease

## 2020-10-25 NOTE — Telephone Encounter (Signed)
*  STAT* If patient is at the pharmacy, call can be transferred to refill team.   1. Which medications need to be refilled? (please list name of each medication and dose if known)  xifaxan   2. Which pharmacy/location (including street and city if local pharmacy) is medication to be sent to? [facility]   3. Do they need a 30 day or 90 day supply? 90   Patient dropped off approval letter from [facility] and is requesting refill.

## 2020-10-25 NOTE — Telephone Encounter (Signed)
Faxed [employer] forms dropped off by pt to pt's pcp . Forwarded to PCP this is a non cardiac medication.

## 2020-10-26 ENCOUNTER — Other Ambulatory Visit: Payer: Self-pay | Admitting: Cardiovascular Disease

## 2020-11-02 ENCOUNTER — Other Ambulatory Visit: Payer: Self-pay

## 2020-11-02 DIAGNOSIS — I5042 Chronic combined systolic (congestive) and diastolic (congestive) heart failure: Secondary | ICD-10-CM

## 2020-11-02 DIAGNOSIS — I5020 Unspecified systolic (congestive) heart failure: Secondary | ICD-10-CM

## 2020-11-02 MED ORDER — DAPAGLIFLOZIN PROPANEDIOL 10 MG PO TABS: 10.0000 mg | ORAL_TABLET | Freq: Every day | ORAL | 3 refills | Status: DC

## 2020-11-02 NOTE — Telephone Encounter (Signed)
Refill sent to [facility] for Farxiga 10 mg

## 2020-11-03 NOTE — Progress Notes (Signed)
Cardiology Office Note:    Date:  11/06/2020   ID:  Willie Clark, DOB [birth year], MRN [id number]  PCP:  [name], MD (Inactive)  [facility] Cardiologist:  [name], MD  [facility] Electrophysiologist:  None   Referring MD: [name], MD   Chief Complaint: 2 month follow-up  History of Present Illness:    Willie Clark is a 70 y.o. male with a hx of CAD s/p CABG in 1993 with redo in 2007 s/p PCI to [facility] 06/20/20. HFrEF, HLD, HTN, GERD, nephrolithiasis, OSA, stroke, prior post-op PE, PFA closure in 2007.   Patient was seen in the office 06/20/2020 by Dr. Fletcher Anon and was having active chest pain.  EKG showed minor ST elevation with reciprocal changes and he was brought up to the Cath Lab for emergent cath.  Cath showed CTO ostial LAD, RCA, mild to moderate ISR left circumflex stent, widely patent LIMA to LAD, patent SVG to OM 2 with 60 to 70% ostial-proximal graft stenosis, subtotally occluded SVG-RCA with long segmental proximal graft disease 89% TIMI flow 1, moderately Doose EF with inferior akinesis.  He was treated with successful PCI to SVG-RCA with overlapping stents x3.  For recurrent chest pain consider PCI to vein graft to OM 2.  He was started on DAPT with aspirin and Brilinta for 12 months.  Echo showed LVEF 35 to 40%.  He was continued on Coreg and losartan.  At follow-up Imdur was added.   Patient was seen 08/07/2020 for post hospital visit and had chest tightness and shortness of breath.  Weight was up 8 pounds.  BNP normal.  He was started on 20 mg Lasix with potassium.   Patient went to the ER 422 for abdominal pain.  CT showed acute diverticulitis and was given antibiotics.   Was seen back 08/16/2020 reported no change in symptoms despite Lasix 20 mg. RedsVest 36%. Imdur was increased.  Lasix was increased to 40 mg twice daily for 3 days then down to 40 mg daily.   Seen 08/25/20 and had persistent chest tightness and sob. Lasix was increased for 3 days,  spiro was increased. Weight was the same and RedsVest 29%.   Seen 09/01/20 and was mildly volume overloaded. With borderline bps [name] was added.   Today, the patient reports he is still short of breath and has chest tightness. Also has been off balance. He will wake up during the night hurting and then it goes away. He finished cardiac rehab last month. Patient is walking on the treadmill daily 15-20 minutes. HE has chest pain and shortness of breath when he walks. He has chest tightness every day, it is worse when he does something. He has taken the ST NTG and it helped a little. Also reports orthopnea. He is on Brilinta and Aspirin. Says he took plavix last time, and stents closed up on Plavix.  He wears compression socks when he wears pants and this helps his lower leg swelling.  He says weight went up 5lbs in the last week. He eats out every day for breakfast, says food has no salt. Does not cook with salt. Unsure if weight is from food or water. Unable to obtain RedsVest today. Has trace lower leg edema on exam. EKG today shows SR with no ischemic changes.   Past Medical History:  Diagnosis Date   Anxiety    Arthritis    Barrett's esophagus    Bowel obstruction ([place]) 02/2009   small bowel  Chronic chest pain    Coronary artery disease    a. 1993 CABG; b. 2007 Redo CABG; c. 09/2015 Cath: LM mild dzs, LAD 100ost, RI mild dzs, LCX 60p, OM1 100, RCA 100ost, VG->OM2 min irregs, LIMA->LAD nl, VG->RPDA 30ost, 20d, nl EF->Med rx.   Depression    Diverticulitis    Diverticulosis    Erectile dysfunction    Fibromyalgia    Gastric ulcer    Gastritis    GERD (gastroesophageal reflux disease)    Headache    Hemorrhoids    HFrEF (heart failure with reduced ejection fraction) ([place])    a. 09/2016 Echo: EF 55-60%, no rwma, triv AI, mild MR, mildly dil LA. -> as of April 2020: EF 40 to 45%.  Septal dyssynergy/hypokinesis due to postop state but otherwise unable to assess wall motion [name] due to  poor study.   History of myocardial infarct at age less than 79 years    Hyperlipidemia    Hypertension    Kidney stones    going to [facility]  June 26 to see kidney specialist   Lightheadedness    Mass on back    Nephrolithiasis    Orthostatic hypotension    a. Improved after discontinuation of metoprolol.   OSA (obstructive sleep apnea)    a. On CPAP.   Pulmonary embolism ([place]) 01/2006   Post-op/treated   Skin lesions, generalized    Stroke [facility]) 1993   right side weakness no blood thinners   on Aspirin   Syncope and collapse    Urticaria     Past Surgical History:  Procedure Laterality Date   BACK SURGERY     bowel obstruction  01/2009   CARDIAC CATHETERIZATION  11/2006   patent grafts. Significant OM3 disease and occluded diagonals.   CARDIAC CATHETERIZATION  06/2010   patent grafts. significant ISR in proximal LCX but OM2 is bypassed and gives retrograde flow to OM3.    CARDIAC CATHETERIZATION  06/2013   [facility]: Patent grafts with 60% proximal in-stent restenosis in the left circumflex with FFR of 0.85   CARDIAC CATHETERIZATION Left 09/25/2015   Procedure: Left Heart Cath and Cors/Grafts Angiography;  Surgeon: Wellington Hampshire, MD;  Location: [place] CV LAB;  Service: Cardiovascular;  Laterality: Left;   CERVICAL FUSION     CHOLECYSTECTOMY     COLON SURGERY  01/2009   BOWEL RESECTION DUE TO SMALL BOWEL OBSTRUCTION   COLONOSCOPY     COLONOSCOPY WITH PROPOFOL N/A 10/28/2017   Procedure: COLONOSCOPY WITH PROPOFOL;  Surgeon: Lollie Sails, MD;  Location: [facility] ENDOSCOPY;  Service: Endoscopy;  Laterality: N/A;   CORONARY ANGIOPLASTY  2006   CORONARY ARTERY BYPASS GRAFT  1993/01/2006   redo at [facility]. LIMA to LAD, SVG to OM2 and SVG to RPDA   CORONARY STENT INTERVENTION N/A 06/20/2020   Procedure: CORONARY STENT INTERVENTION;  Surgeon: Nelva Bush, MD;  Location: [place] CV LAB;  Service: Cardiovascular;  Laterality: N/A;   CYSTOSCOPY WITH STENT PLACEMENT  Bilateral 09/24/2015   Procedure: CYSTOSCOPY WITH BILATERAL RETROGRADES, BILATERAL STENT PLACEMENT;  Surgeon: Festus Aloe, MD;  Location: [facility] ORS;  Service: Urology;  Laterality: Bilateral;   ESOPHAGOGASTRODUODENOSCOPY N/A 10/25/2014   Procedure: ESOPHAGOGASTRODUODENOSCOPY (EGD);  Surgeon: Lollie Sails, MD;  Location: [facility] END[facility]COPY;  Service: Endoscopy;  Laterality: N/A;   ESOPHAGOGASTRODUODENOSCOPY (EGD) WITH PROPOFOL N/A 02/03/2017   Procedure: ESOPHAGOGASTRODUODENOSCOPY (EGD) WITH PROPOFOL;  Surgeon: Lollie Sails, MD;  Location: [facility] ENDOSCOPY;  Service: Endoscopy;  Laterality: N/A;  ESOPHAGOGASTRODUODENOSCOPY (EGD) WITH PROPOFOL N/A 04/18/2017   Procedure: ESOPHAGOGASTRODUODENOSCOPY (EGD) WITH PROPOFOL;  Surgeon: Lollie Sails, MD;  Location: [facility] ENDOSCOPY;  Service: Endoscopy;  Laterality: N/A;   EYE SURGERY Bilateral    Cataract Extraction with IOL   HERNIA REPAIR     INGUINAL HERNIA REPAIR Bilateral 01/23/2016   Procedure: HERNIA REPAIR INGUINAL ADULT BILATERAL;  Surgeon: Leonie Green, MD;  Location: [facility] ORS;  Service: General;  Laterality: Bilateral;   LEFT HEART CATH AND CORS/GRAFTS ANGIOGRAPHY N/A 12/13/2019   Procedure: LEFT HEART CATH AND CORS/GRAFTS ANGIOGRAPHY;  Surgeon: Wellington Hampshire, MD;  Location: [place] CV LAB;  Service: Cardiovascular;  Laterality: N/A;   LEFT HEART CATH AND CORS/GRAFTS ANGIOGRAPHY N/A 06/20/2020   Procedure: LEFT HEART CATH AND CORS/GRAFTS ANGIOGRAPHY;  Surgeon: Nelva Bush, MD;  Location: [place] CV LAB;  Service: Cardiovascular;  Laterality: N/A;   LUMBAR LAMINECTOMY/DECOMPRESSION MICRODISCECTOMY Left 10/01/2016   Procedure: Left Lumbar four-five Laminotomy for resection of synovial cyst;  Surgeon: Erline Levine, MD;  Location: [place];  Service: Neurosurgery;  Laterality: Left;  left   NECK SURGERY  06/2009   SHOULDER SURGERY  2010    Current Medications: Current Meds  Medication Sig   Acetaminophen (TYLENOL  EXTRA STRENGTH PO) Take 6 tablets by mouth daily at 2 am.   albuterol (PROVENTIL HFA;VENTOLIN HFA) 108 (90 Base) MCG/ACT inhaler Inhale into the lungs every 4 (four) hours as needed for wheezing or shortness of breath.   aspirin EC 81 MG EC tablet Take 1 tablet (81 mg total) by mouth daily. Swallow whole.   carvedilol (COREG) 3.125 MG tablet Take 1 tablet (3.125 mg total) by mouth 2 (two) times daily with a meal.   citalopram (CELEXA) 20 MG tablet Take 20 mg by mouth daily.    dapagliflozin propanediol (FARXIGA) 10 MG TABS tablet Take 1 tablet (10 mg total) by mouth daily before breakfast.   EPINEPHrine 0.3 mg/0.3 mL IJ SOAJ injection Inject 0.3 mg into the muscle as needed.    finasteride (PROSCAR) 5 MG tablet Take 5 mg by mouth daily.   furosemide (LASIX) 40 MG tablet TAKE 1 TABLET BY MOUTH EVERY DAY   gabapentin (NEURONTIN) 800 MG tablet Take 1 tablet by mouth 3 (three) times daily.   isosorbide mononitrate (IMDUR) 60 MG 24 hr tablet Take 1 tablet (60 mg total) by mouth daily.   losartan (COZAAR) 25 MG tablet TAKE 1 TABLET EVERY DAY   nitroGLYCERIN (NITROSTAT) 0.4 MG SL tablet Place 1 tablet (0.4 mg total) under the tongue every 5 (five) minutes as needed for chest pain.   ondansetron (ZOFRAN) 4 MG tablet Take 4 mg by mouth every 8 (eight) hours as needed for nausea or vomiting.   pantoprazole (PROTONIX) 40 MG tablet Take 40 mg by mouth 2 (two) times daily.    Peppermint Oil (IBGARD PO) Take by mouth daily at 2 am.   potassium chloride (KLOR-CON) 10 MEQ tablet Take 1 tablet (10 mEq total) by mouth daily.   Probiotic Product (PROBIOTIC PO) Take by mouth daily at 8 pm.   rOPINIRole (REQUIP) 1 MG tablet Take 1 mg by mouth 2 (two) times daily.   rosuvastatin (CRESTOR) 40 MG tablet Take 1 tablet (40 mg total) by mouth daily.   spironolactone (ALDACTONE) 25 MG tablet Take 1 tablet (25 mg total) by mouth daily.   ticagrelor (BRILINTA) 90 MG TABS tablet Take 90 mg by mouth 2 (two) times daily.    VASCEPA 1 g capsule  TAKE 2 CAPSULES TWICE DAILY   vitamin B-12 (CYANOCOBALAMIN) 1000 MCG tablet Take 1,000 mcg by mouth daily.   [DISCONTINUED] isosorbide mononitrate (IMDUR) 30 MG 24 hr tablet Take 1 tablet (30 mg total) by mouth daily.     Allergies:   Amitriptyline, Cyclobenzaprine, Dicyclomine, Sacubitril-valsartan, Acetaminophen, Amitriptyline hcl, Bacon flavor, Hydrocodone, Fish oil, Lidocaine, Omega-3 fatty acids-vitamin e, Other, Oxycodone-acetaminophen, and Sulfa antibiotics   Social History   Socioeconomic History   Marital status: Married    Spouse name: Not on file   Number of children: Not on file   Years of education: Not on file   Highest education level: Not on file  Occupational History   Not on file  Tobacco Use   Smoking status: Former    Packs/day: 2.50    Types: Cigarettes    Quit date: 08/21/1991    Years since quitting: 29.2   Smokeless tobacco: Never  Vaping Use   Vaping Use: Never used  Substance and Sexual Activity   Alcohol use: Not Currently   Drug use: No   Sexual activity: Not on file  Other Topics Concern   Not on file  Social History Narrative   Not on file   Social Determinants of Health   Financial Resource Strain: Not on file  Food Insecurity: Not on file  Transportation Needs: Not on file  Physical Activity: Not on file  Stress: Not on file  Social Connections: Not on file     Family History: The patient's family history includes Heart attack in his father; Heart disease (age of onset: 39) in his father.  ROS:   Please see the history of present illness.     All other systems reviewed and are negative.  EKGs/Labs/Other Studies Reviewed:    The following studies were reviewed today:  Echo 06/20/2020  1. Left ventricular ejection fraction, by estimation, is 35 to 40%. The  left ventricle has moderately decreased function. The left ventricle  demonstrates regional wall motion abnormalities (see scoring  diagram/findings for  description). Left ventricular   diastolic parameters are consistent with Grade I diastolic dysfunction  (impaired relaxation). There is severe hypokinesis of the left  ventricular, entire inferolateral wall.   2. Right ventricular systolic function is low normal. The right  ventricular size is normal.   3. The mitral valve is normal in structure. No evidence of mitral valve  regurgitation.   4. The aortic valve is tricuspid. Aortic valve regurgitation is mild.   5. The inferior vena cava is normal in size with greater than 50%  respiratory variability, suggesting right atrial pressure of 3 mmHg.   Cardiac cath 06/20/20 Conclusions: Severe native CAD, including chronic total occlusions of the ostial LAD and RCA. Patent LCx stent with mild to moderate in-stent restenosis. Widely patent LIMA-LAD. Patent SVG-OM2 with 60-70% ostial/proximal graft stenosis. Subtotally occluded SVG-RCA with long segment of proximal graft disease of up to 80% and 99% stenosis at the distal anastomosis with TIMI-1 flow. Moderately reduced left ventricular contraction with inferior akinesis.  Normal left ventricular filling pressure. Successful PCI to SVG-RCA with overlapping Resolute Onyx 4.0 x 26 mm and 4.0 x 38 mm proximal and 2.5 x 18 mm distal (extending into rPDA) drug-eluting stents.   Recommendations: Continue tirofiban infusion x 6 hours. Dual antiplatelet therapy with aspirin and tigagrelor for at least 12 months. Aggressive secondary prevention. If patient has recurrent chest pain, consider PCI to SVG-OM2. Follow-up echocardiogram.   Nelva Bush, MD [facility]  Coronary Diagrams     Diagnostic Dominance: Right      Intervention             EKG:  EKG is ordered today.  The ekg ordered today demonstrates NSR 62 bpm, nonspecific T wave changes  Recent Labs: 12/12/2019: Magnesium 2.7 06/20/2020: ALT 33 08/07/2020: NT-Pro BNP 242; TSH 1.960 08/11/2020: Hemoglobin 14.0;  Platelets 175 08/25/2020: BNP 39.5; BUN 8; Creatinine, Ser 0.91; Potassium 3.8; Sodium 144  Recent Lipid Panel    Component Value Date/Time   CHOL 165 02/07/2020 0658   TRIG 317 (H) 02/07/2020 0658   HDL 41 02/07/2020 0658   CHOLHDL 4.0 02/07/2020 0658   VLDL 63 (H) 02/07/2020 0658   LDLCALC 61 02/07/2020 0658      Physical Exam:    VS:  BP 120/70 (BP Location: Left Arm, Patient Position: Sitting, Cuff Size: Normal)   Pulse 62   Ht 5' 8.5" (1.74 m)   Wt 202 lb 4 oz (91.7 kg)   SpO2 98%   BMI 30.30 kg/m     Wt Readings from Last 3 Encounters:  11/06/20 202 lb 4 oz (91.7 kg)  09/01/20 202 lb 8 oz (91.9 kg)  08/25/20 201 lb 8 oz (91.4 kg)     GEN:  Well nourished, well developed in no acute distress HEENT: Normal NECK: No JVD; No carotid bruits LYMPHATICS: No lymphadenopathy CARDIAC: RRR, no murmurs, rubs, gallops RESPIRATORY:  Clear to auscultation without rales, wheezing or rhonchi  ABDOMEN: Soft, non-tender, non-distended MUSCULOSKELETAL:  Trace lower leg edema; No deformity  SKIN: Warm and dry NEUROLOGIC:  Alert and oriented x 3 PSYCHIATRIC:  Normal affect   ASSESSMENT:    1. Shortness of breath   2. Chronic combined systolic and diastolic congestive heart failure (HCC)   3. HFrEF (heart failure with reduced ejection fraction) ([place])   4. Coronary artery disease involving native coronary artery of native heart without angina pectoris   5. Essential hypertension   6. Chest tightness   7. AAA (abdominal aortic aneurysm) without rupture ([place])   8. Hyperlipidemia, unspecified hyperlipidemia type    PLAN:    In order of problems listed above:  Chest tightness and shortness of breath CAD s/p CAGB with recent PCI with SVG to RCA 06/20/20 Patient reports worsening exertional chest pain and shortness of breath since the last visit. He was started on Farxiga last visit and didn't feel this made every difference. Does not appear significantly volume up on exam, has trace  lower leg edema. Lung clear and no JVD appreciated. Unable to obtain reds vest today.  Weight is the same. In the past increase lasix doses didn't seem to help. He is on lasix 40mg  daily, Farxiga, and spironolactone 25mg  daily. Symptoms are exertional and he has taken SL NTG with minimal relief. Do not suspect significant volume overload. Given exertional symptoms I will increase Imdur to 60mg  daily. He takes Aspirin and Brilinta for DAPT. At this point may need to consider repeat R/L heart cath, will discuss with MD. Continue coreg and statin. Patient reports remote ISR on plavix.   HFrEF 35-40% ICM As above, patient has trace edema on exam. Weight is the same as the last 2 visits. Unable to obtain RedsVest today. He takes lasix 40mg  daily, spironolactone 25mg  daily, and [place]. As above, have tried increasing lasix in the past and chest pain/sob have continued. I will continue current lasix dose. Continue Coreg and Losartan. Continue low salt diet, compression socks, and leg  elevation. Re-evaluate at follow-up.   HTN BP good today. Continue current medications. Increase Imdur as above.   HLD Continue rosuvastatin and Vascepa. LDL 61 and TG 317.   Small abdominal aortic aneurysm [place] measuring 3cm. Recommend repeat US in 2 years.   Disposition: Follow up in 1 month(s) with MD/APP    Signed, Ryszard Socarras Ninfa Meeker, PA-C  11/06/2020 10:21 PM    [facility]

## 2020-11-06 ENCOUNTER — Ambulatory Visit: Payer: Medicare HMO | Admitting: Medical

## 2020-11-06 ENCOUNTER — Other Ambulatory Visit: Payer: Self-pay

## 2020-11-06 ENCOUNTER — Encounter: Payer: Self-pay | Admitting: Medical

## 2020-11-06 VITALS — BP 120/70 | HR 62 | Ht 68.5 in | Wt 202.2 lb

## 2020-11-06 DIAGNOSIS — I5042 Chronic combined systolic (congestive) and diastolic (congestive) heart failure: Secondary | ICD-10-CM

## 2020-11-06 DIAGNOSIS — I714 Abdominal aortic aneurysm, without rupture, unspecified: Secondary | ICD-10-CM

## 2020-11-06 DIAGNOSIS — R0789 Other chest pain: Secondary | ICD-10-CM

## 2020-11-06 DIAGNOSIS — I2510 Atherosclerotic heart disease of native coronary artery without angina pectoris: Secondary | ICD-10-CM | POA: Diagnosis not present

## 2020-11-06 DIAGNOSIS — R0602 Shortness of breath: Secondary | ICD-10-CM | POA: Diagnosis not present

## 2020-11-06 DIAGNOSIS — I10 Essential (primary) hypertension: Secondary | ICD-10-CM

## 2020-11-06 DIAGNOSIS — I5020 Unspecified systolic (congestive) heart failure: Secondary | ICD-10-CM

## 2020-11-06 DIAGNOSIS — E785 Hyperlipidemia, unspecified: Secondary | ICD-10-CM

## 2020-11-06 MED ORDER — ISOSORBIDE MONONITRATE ER 60 MG PO TB24: 60.0000 mg | ORAL_TABLET | Freq: Every day | ORAL | 3 refills | Status: DC

## 2020-11-06 NOTE — Patient Instructions (Signed)
Medication Instructions:  Your physician has recommended you make the following change in your medication:   INCREASE Isosorbide mononitrate to 60 mg once daily.   *If you need a refill on your cardiac medications before your next appointment, please call your pharmacy*   Lab Work: None  If you have labs (blood work) drawn today and your tests are completely normal, you will receive your results only by: [place] (if you have MyChart) OR A paper copy in the mail If you have any lab test that is abnormal or we need to change your treatment, we will call you to review the results.   Testing/Procedures: None   Follow-Up: At [facility], you and your health needs are our priority.  As part of our continuing mission to provide you with exceptional heart care, we have created designated Provider Care Teams.  These Care Teams include your primary Cardiologist (physician) and Advanced Practice Providers (APPs -  Physician Assistants and Nurse Practitioners) who all work together to provide you with the care you need, when you need it.   Your next appointment:   1 month(s)  The format for your next appointment:   In Person  Provider:   [name], MD

## 2020-11-13 ENCOUNTER — Telehealth: Payer: Self-pay | Admitting: Cardiovascular Disease

## 2020-11-13 NOTE — Progress Notes (Signed)
Cardiology Office Note  Date:  11/14/2020   ID:  Willie Clark, DOB September 24, 1950, MRN [id number]  PCP:  Willie Sia, MD (Inactive)   Chief Complaint  Patient presents with   Other    Worsening SOB/CP. Meds reviewed verbally with pt.    HPI:  Willie Clark is a 70 y.o. male with a hx of CAD s/p CABG in 1993 with redo in 2007 s/p PCI to [facility] 06/20/20.  HFrEF, HLD, HTN, GERD,  nephrolithiasis, OSA, stroke, prior post-op PE, PFA closure in 2007 Who presents for urgent add-on to the office for shortness of breath, chest pain  For cardiac history as detailed below Patient of Dr. Fletcher Anon,  In follow-up reports worsening shortness of breath, leg swelling, abdominal distention, continues to have periodic chest pain Difficulty laying flat, has to sit up Has noticed increased swelling in his legs, difficulty getting around Bending over her abdomen feels tight  Periodically takes nitro for chest pain,  Legs feel weak, overall does not feel as strong as he used to  Reports unable to afford his Dalene Seltzer, [name], is not taking these Difficulty affording his Brilinta  EKG personally reviewed by myself on todays visit Normal sinus rhythm rate 67 bpm nonspecific ST-T wave abnormality  Catheterization March 2022 CTO ostial LAD, RCA, mild to moderate ISR left circumflex stent, widely patent LIMA to LAD, patent SVG to OM 2 with 60 to 70% ostial-proximal graft stenosis, subtotally occluded SVG-RCA with long segmental proximal graft disease 89% TIMI flow 1, moderately Doose EF with inferior akinesis.  He was treated with successful PCI to SVG-RCA with overlapping stents x3.  For recurrent chest pain consider PCI to vein graft to OM 2.   Subsequent office visit since that time continued on Coreg and losartan.Imdur was added.   08/07/2020 started on 20 mg Lasix with potassium.  /27/2022 rRedsVest 36%. Lasix was increased to 40 mg twice daily for 3 days then down to 40 mg daily.  Imdur increased    08/25/20 and had persistent chest tightness and sob. Lasix was increased for 3 days, spiro was increased. Weight was the same and RedsVest 29%.   Seen 09/01/20 and was mildly volume overloaded. With borderline bps [name] was added.   PMH:   has a past medical history of Anxiety, Arthritis, Barrett's esophagus, Bowel obstruction ([place]) (02/2009), Chronic chest pain, Coronary artery disease, Depression, Diverticulitis, Diverticulosis, Erectile dysfunction, Fibromyalgia, Gastric ulcer, Gastritis, GERD (gastroesophageal reflux disease), Headache, Hemorrhoids, HFrEF (heart failure with reduced ejection fraction) ([place]), History of myocardial infarct at age less than 13 years, Hyperlipidemia, Hypertension, Kidney stones, Lightheadedness, Mass on back, Nephrolithiasis, Orthostatic hypotension, OSA (obstructive sleep apnea), Pulmonary embolism ([place]) (01/2006), Skin lesions, generalized, Stroke ([place]) (1993), Syncope and collapse, and Urticaria.  PSH:    Past Surgical History:  Procedure Laterality Date   BACK SURGERY     bowel obstruction  01/2009   CARDIAC CATHETERIZATION  11/2006   patent grafts. Significant OM3 disease and occluded diagonals.   CARDIAC CATHETERIZATION  06/2010   patent grafts. significant ISR in proximal LCX but OM2 is bypassed and gives retrograde flow to OM3.    CARDIAC CATHETERIZATION  06/2013   [facility]: Patent grafts with 60% proximal in-stent restenosis in the left circumflex with FFR of 0.85   CARDIAC CATHETERIZATION Left 09/25/2015   Procedure: Left Heart Cath and Cors/Grafts Angiography;  Surgeon: Wellington Hampshire, MD;  Location: [place] [facility];  Service: Cardiovascular;  Laterality: Left;   CERVICAL FUSION  CHOLECYSTECTOMY     COLON SURGERY  01/2009   BOWEL RESECTION DUE TO SMALL BOWEL OBSTRUCTION   COLONOSCOPY     COLONOSCOPY WITH PROPOFOL N/A 10/28/2017   Procedure: COLONOSCOPY WITH PROPOFOL;  Surgeon: Lollie Sails, MD;  Location: [facility] ENDOSCOPY;  Service:  Endoscopy;  Laterality: N/A;   CORONARY ANGIOPLASTY  2006   CORONARY ARTERY BYPASS GRAFT  1993/01/2006   redo at [facility]. LIMA to LAD, SVG to OM2 and SVG to RPDA   CORONARY STENT INTERVENTION N/A 06/20/2020   Procedure: CORONARY STENT INTERVENTION;  Surgeon: Nelva Bush, MD;  Location: [place] [facility];  Service: Cardiovascular;  Laterality: N/A;   CYSTOSCOPY WITH STENT PLACEMENT Bilateral 09/24/2015   Procedure: CYSTOSCOPY WITH BILATERAL RETROGRADES, BILATERAL STENT PLACEMENT;  Surgeon: Festus Aloe, MD;  Location: [facility] ORS;  Service: Urology;  Laterality: Bilateral;   ESOPHAGOGASTRODUODENOSCOPY N/A 10/25/2014   Procedure: ESOPHAGOGASTRODUODENOSCOPY (EGD);  Surgeon: Lollie Sails, MD;  Location: [facility] ENDOSCOPY;  Service: Endoscopy;  Laterality: N/A;   ESOPHAGOGASTRODUODENOSCOPY (EGD) WITH PROPOFOL N/A 02/03/2017   Procedure: ESOPHAGOGASTRODUODENOSCOPY (EGD) WITH PROPOFOL;  Surgeon: Lollie Sails, MD;  Location: [facility] ENDOSCOPY;  Service: Endoscopy;  Laterality: N/A;   ESOPHAGOGASTRODUODENOSCOPY (EGD) WITH PROPOFOL N/A 04/18/2017   Procedure: ESOPHAGOGASTRODUODENOSCOPY (EGD) WITH PROPOFOL;  Surgeon: Lollie Sails, MD;  Location: [facility] ENDOSCOPY;  Service: Endoscopy;  Laterality: N/A;   EYE SURGERY Bilateral    Cataract Extraction with IOL   HERNIA REPAIR     INGUINAL HERNIA REPAIR Bilateral 01/23/2016   Procedure: HERNIA REPAIR INGUINAL ADULT BILATERAL;  Surgeon: Leonie Green, MD;  Location: [facility] ORS;  Service: General;  Laterality: Bilateral;   LEFT HEART CATH AND CORS/GRAFTS ANGIOGRAPHY N/A 12/13/2019   Procedure: LEFT HEART CATH AND CORS/GRAFTS ANGIOGRAPHY;  Surgeon: Wellington Hampshire, MD;  Location: [place] [facility];  Service: Cardiovascular;  Laterality: N/A;   LEFT HEART CATH AND CORS/GRAFTS ANGIOGRAPHY N/A 06/20/2020   Procedure: LEFT HEART CATH AND CORS/GRAFTS ANGIOGRAPHY;  Surgeon: Nelva Bush, MD;  Location: [place] [facility];  Service: Cardiovascular;   Laterality: N/A;   LUMBAR LAMINECTOMY/DECOMPRESSION MICRODISCECTOMY Left 10/01/2016   Procedure: Left Lumbar four-five Laminotomy for resection of synovial cyst;  Surgeon: Erline Levine, MD;  Location: [place];  Service: Neurosurgery;  Laterality: Left;  left   NECK SURGERY  06/2009   SHOULDER SURGERY  2010    Current Outpatient Medications  Medication Sig Dispense Refill   Acetaminophen (TYLENOL EXTRA STRENGTH PO) Take 6 tablets by mouth daily at 2 am.     albuterol (PROVENTIL HFA;VENTOLIN HFA) 108 (90 Base) MCG/ACT inhaler Inhale into the lungs every 4 (four) hours as needed for wheezing or shortness of breath.     aspirin EC 81 MG EC tablet Take 1 tablet (81 mg total) by mouth daily. Swallow whole. 30 tablet 11   carvedilol (COREG) 3.125 MG tablet Take 1 tablet (3.125 mg total) by mouth 2 (two) times daily with a meal. 180 tablet 0   citalopram (CELEXA) 20 MG tablet Take 20 mg by mouth daily.      dapagliflozin propanediol (FARXIGA) 10 MG TABS tablet Take 1 tablet (10 mg total) by mouth daily before breakfast. 90 tablet 3   EPINEPHrine 0.3 mg/0.3 mL IJ SOAJ injection Inject 0.3 mg into the muscle as needed.      Erenumab-aooe 140 MG/ML SOAJ Inject 140 mg into the skin every 28 (twenty-eight) days.     finasteride (PROSCAR) 5 MG tablet Take 5 mg by mouth daily.  furosemide (LASIX) 40 MG tablet TAKE 1 TABLET BY MOUTH EVERY DAY 30 tablet 4   gabapentin (NEURONTIN) 800 MG tablet Take 1 tablet by mouth 3 (three) times daily.     isosorbide mononitrate (IMDUR) 60 MG 24 hr tablet Take 1 tablet (60 mg total) by mouth daily. 90 tablet 3   losartan (COZAAR) 25 MG tablet TAKE 1 TABLET EVERY DAY 90 tablet 0   nitroGLYCERIN (NITROSTAT) 0.4 MG SL tablet Place 1 tablet (0.4 mg total) under the tongue every 5 (five) minutes as needed for chest pain. 90 tablet 1   ondansetron (ZOFRAN) 4 MG tablet Take 4 mg by mouth every 8 (eight) hours as needed for nausea or vomiting.     pantoprazole (PROTONIX) 40 MG  tablet Take 40 mg by mouth 2 (two) times daily.      Peppermint Oil (IBGARD PO) Take by mouth daily at 2 am.     potassium chloride (KLOR-CON) 10 MEQ tablet Take 1 tablet (10 mEq total) by mouth daily. 90 tablet 3   Probiotic Product (PROBIOTIC PO) Take by mouth daily at 8 pm.     rOPINIRole (REQUIP) 1 MG tablet Take 1 mg by mouth 2 (two) times daily.     rosuvastatin (CRESTOR) 40 MG tablet Take 1 tablet (40 mg total) by mouth daily. 90 tablet 1   spironolactone (ALDACTONE) 25 MG tablet Take 1 tablet (25 mg total) by mouth daily. 90 tablet 0   ticagrelor (BRILINTA) 90 MG TABS tablet Take 90 mg by mouth 2 (two) times daily.     VASCEPA 1 g capsule TAKE 2 CAPSULES TWICE DAILY 360 capsule 1   vitamin B-12 (CYANOCOBALAMIN) 1000 MCG tablet Take 1,000 mcg by mouth daily.     No current facility-administered medications for this visit.     Allergies:   Amitriptyline, Cyclobenzaprine, Dicyclomine, Sacubitril-valsartan, Acetaminophen, Amitriptyline hcl, Bacon flavor, Hydrocodone, Fish oil, Lidocaine, Omega-3 fatty acids-vitamin e, Other, Oxycodone-acetaminophen, and Sulfa antibiotics   Social History:  The patient  reports that he quit smoking about 29 years ago. His smoking use included cigarettes. He smoked an average of 2.5 packs per day. He has never used smokeless tobacco. He reports previous alcohol use. He reports that he does not use drugs.   Family History:   family history includes Heart attack in his father; Heart disease (age of onset: 48) in his father.    Review of Systems: Review of Systems  Constitutional: Negative.   HENT: Negative.    Respiratory: Negative.    Cardiovascular: Negative.   Gastrointestinal: Negative.   Musculoskeletal: Negative.   Neurological: Negative.   Psychiatric/Behavioral: Negative.    All other systems reviewed and are negative.   PHYSICAL EXAM: VS:  BP 120/70 (BP Location: Left Arm, Patient Position: Sitting, Cuff Size: Normal)   Pulse 70   Ht 5'  8.5" (1.74 m)   Wt 200 lb 2 oz (90.8 kg)   SpO2 98%   BMI 29.99 kg/m  , BMI Body mass index is 29.99 kg/m. Constitutional:  oriented to person, place, and time. No distress.  HENT:  Head: Grossly normal Eyes:  no discharge. No scleral icterus.  Neck: No JVD, no carotid bruits  Cardiovascular: Regular rate and rhythm, no murmurs appreciated Pulmonary/Chest: Clear to auscultation bilaterally, no wheezes or rails Abdominal: Soft.  no distension.  no tenderness.  Musculoskeletal: Normal range of motion Neurological:  normal muscle tone. Coordination normal. No atrophy Skin: Skin warm and dry Psychiatric: normal affect, pleasant  Recent  Labs: 12/12/2019: Magnesium 2.7 06/20/2020: ALT 33 08/07/2020: NT-Pro BNP 242; TSH 1.960 08/11/2020: Hemoglobin 14.0; Platelets 175 08/25/2020: BNP 39.5; BUN 8; Creatinine, Ser 0.91; Potassium 3.8; Sodium 144    Lipid Panel Lab Results  Component Value Date   CHOL 165 02/07/2020   HDL 41 02/07/2020   LDLCALC 61 02/07/2020   TRIG 317 (H) 02/07/2020      Wt Readings from Last 3 Encounters:  11/14/20 200 lb 2 oz (90.8 kg)  11/06/20 202 lb 4 oz (91.7 kg)  09/01/20 202 lb 8 oz (91.9 kg)     ASSESSMENT AND PLAN:  Problem List Items Addressed This Visit       Cardiology Problems   Hyperlipidemia   Chronic combined systolic and diastolic congestive heart failure (HCC)   Essential hypertension     Other   Dyspnea   Other Visit Diagnoses     Coronary artery disease of native artery of native heart with stable angina pectoris (HCC)    -  Primary   Chest tightness       AAA (abdominal aortic aneurysm) without rupture (HCC)          Acute on chronic diastolic and systolic CHF Worsening leg swelling, abdominal distention, shortness of breath, PND orthopnea We have recommended he stop Lasix and start torsemide 40 mg twice daily for 3 days then down to torsemide 40 daily Recommend he take potassium 10 mill equivalents with each torsemide 20 mg  pill -Salt and fluid restriction recommended --- Interestingly at the time of this dictation BNP from the emergency room of 20  CAD with stable angina Reported stable chest pain, after visit was completed I was called back into the room he was laying supine reported 7/10 chest pain Blood pressure stable, heart rate in the 80s, no change on repeat EKG He was given sublingual nitroglycerin x1 with improvement of pain down to 2/10 He reports this chest pain was worse and some of his others Given anginal symptom, we recommended he go to the emergency room for consideration of right and left heart catheterization -He was in agreement, transport provided Would recommend heparin infusion overnight, case discussed with Dr. Saunders Revel, plan for cardiac catheterization tomorrow on Wednesday, July 27    Total encounter time more than 35 minutes  Greater than 50% was spent in counseling and coordination of care with the patient    Signed, Esmond Plants, M.D., Ph.D. [place], [place]

## 2020-11-13 NOTE — Telephone Encounter (Signed)
Pt c/o worsening shortness of breath "even at rest" and "chest does not feel right when I am short of breath." "Since last office visit my shortness of breath and chest pain have gotten worse by the day."   Pt with recent PCI 06/20/20 and s/p CABG.  Last ov with [name], [place] 11/06/20  - "Patient reports worsening exertional chest pain and shortness of breath since the last visit" At visit 7/18 Imdur increased to '60mg'$  daily. Pt completed cardiac rehab last month. "At this point may need to consider repeat R/L heart cath, will discuss with MD."  Pt reports minimal improvement of chest pain since incr Imdur to '60mg'$  daily.   Had pt take BP while on phone: 109/69 HR 64 Pt states he would like to discuss scheduling cath procedure if that is what is recommended.   Advised pt that I am discussing with Dr. Rockey Situ. Scheduled to see in clinic tomorrow 11/14/20 at 3:20 PM with Dr. Rockey Situ. Pt confirmed appointment. Notified I will let him know of any further recc after Dr. Rockey Situ has reveiwed.  Advised pt if s/s worsen or he becomes unstable to call 911 or go to the emergency room.  Pt voiced understanding.

## 2020-11-13 NOTE — Telephone Encounter (Signed)
Pt c/o Shortness Of Breath: STAT if SOB developed within the last 24 hours or pt is noticeably SOB on the phone  1. Are you currently SOB (can you hear that pt is SOB on the phone)? yes  2. How long have you been experiencing SOB? Been getting worse since cath in March  3. Are you SOB when sitting or when up moving around? Both - can be sitting and it comes and something like making coffee can as well  4. Are you currently experiencing any other symptoms? Chest does not feel right when short of breath

## 2020-11-14 ENCOUNTER — Encounter: Payer: Self-pay | Admitting: Internal Medicine

## 2020-11-14 ENCOUNTER — Encounter: Payer: Self-pay | Admitting: *Deleted

## 2020-11-14 ENCOUNTER — Other Ambulatory Visit: Payer: Self-pay

## 2020-11-14 ENCOUNTER — Emergency Department: Payer: Medicare HMO

## 2020-11-14 ENCOUNTER — Inpatient Hospital Stay
Admission: EM | Admit: 2020-11-14 | Discharge: 2020-11-16 | DRG: 247 | Disposition: A | Discharge status: Home / Self Care | Payer: Medicare HMO | Source: Ambulatory Visit | Attending: Internal Medicine | Admitting: Internal Medicine

## 2020-11-14 ENCOUNTER — Ambulatory Visit: Payer: Medicare HMO | Admitting: Cardiovascular Disease

## 2020-11-14 ENCOUNTER — Encounter: Payer: Self-pay | Admitting: Cardiovascular Disease

## 2020-11-14 VITALS — BP 120/70 | HR 70 | Ht 68.5 in | Wt 200.1 lb

## 2020-11-14 DIAGNOSIS — E782 Mixed hyperlipidemia: Secondary | ICD-10-CM

## 2020-11-14 DIAGNOSIS — I429 Cardiomyopathy, unspecified: Secondary | ICD-10-CM | POA: Diagnosis present

## 2020-11-14 DIAGNOSIS — E785 Hyperlipidemia, unspecified: Secondary | ICD-10-CM | POA: Diagnosis present

## 2020-11-14 DIAGNOSIS — I110 Hypertensive heart disease with heart failure: Secondary | ICD-10-CM | POA: Diagnosis present

## 2020-11-14 DIAGNOSIS — I25119 Atherosclerotic heart disease of native coronary artery with unspecified angina pectoris: Secondary | ICD-10-CM | POA: Diagnosis present

## 2020-11-14 DIAGNOSIS — G629 Polyneuropathy, unspecified: Secondary | ICD-10-CM | POA: Diagnosis present

## 2020-11-14 DIAGNOSIS — I252 Old myocardial infarction: Secondary | ICD-10-CM | POA: Diagnosis not present

## 2020-11-14 DIAGNOSIS — K219 Gastro-esophageal reflux disease without esophagitis: Secondary | ICD-10-CM | POA: Diagnosis present

## 2020-11-14 DIAGNOSIS — Z8673 Personal history of transient ischemic attack (TIA), and cerebral infarction without residual deficits: Secondary | ICD-10-CM

## 2020-11-14 DIAGNOSIS — I714 Abdominal aortic aneurysm, without rupture, unspecified: Secondary | ICD-10-CM

## 2020-11-14 DIAGNOSIS — I25110 Atherosclerotic heart disease of native coronary artery with unstable angina pectoris: Secondary | ICD-10-CM | POA: Diagnosis present

## 2020-11-14 DIAGNOSIS — Z7902 Long term (current) use of antithrombotics/antiplatelets: Secondary | ICD-10-CM | POA: Diagnosis not present

## 2020-11-14 DIAGNOSIS — R0602 Shortness of breath: Secondary | ICD-10-CM

## 2020-11-14 DIAGNOSIS — I10 Essential (primary) hypertension: Secondary | ICD-10-CM

## 2020-11-14 DIAGNOSIS — Z951 Presence of aortocoronary bypass graft: Secondary | ICD-10-CM | POA: Diagnosis not present

## 2020-11-14 DIAGNOSIS — Z7982 Long term (current) use of aspirin: Secondary | ICD-10-CM | POA: Diagnosis not present

## 2020-11-14 DIAGNOSIS — I5042 Chronic combined systolic (congestive) and diastolic (congestive) heart failure: Secondary | ICD-10-CM | POA: Diagnosis present

## 2020-11-14 DIAGNOSIS — I25710 Atherosclerosis of autologous vein coronary artery bypass graft(s) with unstable angina pectoris: Secondary | ICD-10-CM | POA: Diagnosis not present

## 2020-11-14 DIAGNOSIS — I5043 Acute on chronic combined systolic (congestive) and diastolic (congestive) heart failure: Secondary | ICD-10-CM | POA: Diagnosis not present

## 2020-11-14 DIAGNOSIS — I428 Other cardiomyopathies: Secondary | ICD-10-CM | POA: Diagnosis not present

## 2020-11-14 DIAGNOSIS — I25118 Atherosclerotic heart disease of native coronary artery with other forms of angina pectoris: Secondary | ICD-10-CM

## 2020-11-14 DIAGNOSIS — I5020 Unspecified systolic (congestive) heart failure: Secondary | ICD-10-CM | POA: Diagnosis not present

## 2020-11-14 DIAGNOSIS — I208 Other forms of angina pectoris: Secondary | ICD-10-CM | POA: Diagnosis not present

## 2020-11-14 DIAGNOSIS — J432 Centrilobular emphysema: Secondary | ICD-10-CM | POA: Diagnosis not present

## 2020-11-14 DIAGNOSIS — Z87891 Personal history of nicotine dependence: Secondary | ICD-10-CM

## 2020-11-14 DIAGNOSIS — I200 Unstable angina: Secondary | ICD-10-CM | POA: Diagnosis not present

## 2020-11-14 DIAGNOSIS — M797 Fibromyalgia: Secondary | ICD-10-CM | POA: Diagnosis present

## 2020-11-14 DIAGNOSIS — Z79899 Other long term (current) drug therapy: Secondary | ICD-10-CM

## 2020-11-14 DIAGNOSIS — Z8249 Family history of ischemic heart disease and other diseases of the circulatory system: Secondary | ICD-10-CM

## 2020-11-14 DIAGNOSIS — Z86711 Personal history of pulmonary embolism: Secondary | ICD-10-CM | POA: Diagnosis not present

## 2020-11-14 DIAGNOSIS — I2510 Atherosclerotic heart disease of native coronary artery without angina pectoris: Secondary | ICD-10-CM | POA: Diagnosis present

## 2020-11-14 DIAGNOSIS — R0789 Other chest pain: Secondary | ICD-10-CM

## 2020-11-14 DIAGNOSIS — Z955 Presence of coronary angioplasty implant and graft: Secondary | ICD-10-CM

## 2020-11-14 DIAGNOSIS — J449 Chronic obstructive pulmonary disease, unspecified: Secondary | ICD-10-CM | POA: Diagnosis present

## 2020-11-14 DIAGNOSIS — Z20822 Contact with and (suspected) exposure to covid-19: Secondary | ICD-10-CM | POA: Diagnosis present

## 2020-11-14 LAB — CBC WITH DIFFERENTIAL/PLATELET
Abs Immature Granulocytes: 0.04 10*3/uL (ref 0.00–0.07)
Basophils Absolute: 0 10*3/uL (ref 0.0–0.1)
Basophils Relative: 0 %
Eosinophils Absolute: 0.1 10*3/uL (ref 0.0–0.5)
Eosinophils Relative: 1 %
HCT: 43.3 % (ref 39.0–52.0)
Hemoglobin: 14.8 g/dL (ref 13.0–17.0)
Immature Granulocytes: 1 %
Lymphocytes Relative: 35 %
Lymphs Abs: 2.5 10*3/uL (ref 0.7–4.0)
MCH: 33 pg (ref 26.0–34.0)
MCHC: 34.2 g/dL (ref 30.0–36.0)
MCV: 96.4 fL (ref 80.0–100.0)
Monocytes Absolute: 0.5 10*3/uL (ref 0.1–1.0)
Monocytes Relative: 7 %
Neutro Abs: 4.1 10*3/uL (ref 1.7–7.7)
Neutrophils Relative %: 56 %
Platelets: 195 10*3/uL (ref 150–400)
RBC: 4.49 MIL/uL (ref 4.22–5.81)
RDW: 13.3 % (ref 11.5–15.5)
WBC: 7.2 10*3/uL (ref 4.0–10.5)
nRBC: 0 % (ref 0.0–0.2)

## 2020-11-14 LAB — T4, FREE: Free T4: 1.07 ng/dL (ref 0.61–1.12)

## 2020-11-14 LAB — COMPREHENSIVE METABOLIC PANEL
ALT: 23 U/L (ref 0–44)
AST: 27 U/L (ref 15–41)
Albumin: 4.6 g/dL (ref 3.5–5.0)
Alkaline Phosphatase: 52 U/L (ref 38–126)
Anion gap: 10 (ref 5–15)
BUN: 11 mg/dL (ref 8–23)
CO2: 29 mmol/L (ref 22–32)
Calcium: 9.3 mg/dL (ref 8.9–10.3)
Chloride: 99 mmol/L (ref 98–111)
Creatinine, Ser: 1.07 mg/dL (ref 0.61–1.24)
GFR, Estimated: >60 mL/min (ref >60)
Glucose, Bld: 111 mg/dL — ABNORMAL HIGH (ref 70–99)
Potassium: 4.1 mmol/L (ref 3.5–5.1)
Sodium: 138 mmol/L (ref 135–145)
Total Bilirubin: 0.9 mg/dL (ref 0.3–1.2)
Total Protein: 6.9 g/dL (ref 6.5–8.1)

## 2020-11-14 LAB — RESP PANEL BY RT-PCR (FLU A&B, COVID) ARPGX2
Influenza A by PCR: NEGATIVE
Influenza B by PCR: NEGATIVE
SARS Coronavirus 2 by RT PCR: NEGATIVE

## 2020-11-14 LAB — TSH: TSH: 2.375 u[IU]/mL (ref 0.350–4.500)

## 2020-11-14 LAB — PROTIME-INR
INR: 0.9 (ref 0.8–1.2)
Prothrombin Time: 12.6 seconds (ref 11.4–15.2)

## 2020-11-14 LAB — BRAIN NATRIURETIC PEPTIDE: B Natriuretic Peptide: 20.8 pg/mL (ref 0.0–100.0)

## 2020-11-14 LAB — TROPONIN I (HIGH SENSITIVITY)
Troponin I (High Sensitivity): 8 ng/L (ref <18)
Troponin I (High Sensitivity): 8 ng/L (ref <18)

## 2020-11-14 LAB — APTT: aPTT: 28 seconds (ref 24–36)

## 2020-11-14 MED ORDER — PANTOPRAZOLE SODIUM 40 MG PO TBEC
40.0000 mg | DELAYED_RELEASE_TABLET | Freq: Two times a day (BID) | ORAL | Status: DC
  Administered 2020-11-14 – 2020-11-16 (×3): 40 mg via ORAL
  Filled 2020-11-14 (×3): qty 1

## 2020-11-14 MED ORDER — ACETAMINOPHEN 325 MG PO TABS: 650.0000 mg | ORAL_TABLET | ORAL | Status: DC | PRN

## 2020-11-14 MED ORDER — CARVEDILOL 3.125 MG PO TABS
3.1250 mg | ORAL_TABLET | Freq: Two times a day (BID) | ORAL | Status: DC
  Administered 2020-11-15 – 2020-11-16 (×2): 3.125 mg via ORAL
  Filled 2020-11-14 (×2): qty 1

## 2020-11-14 MED ORDER — CITALOPRAM HYDROBROMIDE 20 MG PO TABS
20.0000 mg | ORAL_TABLET | Freq: Every day | ORAL | Status: DC
  Administered 2020-11-15 – 2020-11-16 (×2): 20 mg via ORAL
  Filled 2020-11-14 (×2): qty 1

## 2020-11-14 MED ORDER — ROSUVASTATIN CALCIUM 10 MG PO TABS
40.0000 mg | ORAL_TABLET | Freq: Every day | ORAL | Status: DC
  Administered 2020-11-15: 40 mg via ORAL
  Filled 2020-11-14: qty 4
  Filled 2020-11-14: qty 2

## 2020-11-14 MED ORDER — SODIUM CHLORIDE 0.9% FLUSH
3.0000 mL | Freq: Two times a day (BID) | INTRAVENOUS | Status: DC
  Administered 2020-11-15 – 2020-11-16 (×2): 3 mL via INTRAVENOUS

## 2020-11-14 MED ORDER — HEPARIN BOLUS VIA INFUSION
4000.0000 [IU] | Freq: Once | INTRAVENOUS | Status: AC
  Administered 2020-11-14: 4000 [IU] via INTRAVENOUS
  Filled 2020-11-14: qty 4000

## 2020-11-14 MED ORDER — ALBUTEROL SULFATE HFA 108 (90 BASE) MCG/ACT IN AERS: 1.0000 | INHALATION_SPRAY | Freq: Four times a day (QID) | RESPIRATORY_TRACT | Status: DC | PRN

## 2020-11-14 MED ORDER — ONDANSETRON HCL 4 MG/2ML IJ SOLN: 4.0000 mg | Freq: Four times a day (QID) | INTRAMUSCULAR | Status: DC | PRN

## 2020-11-14 MED ORDER — SODIUM CHLORIDE 0.9% FLUSH: 3.0000 mL | INTRAVENOUS | Status: DC | PRN

## 2020-11-14 MED ORDER — ALBUTEROL SULFATE (2.5 MG/3ML) 0.083% IN NEBU: 2.5000 mg | INHALATION_SOLUTION | Freq: Four times a day (QID) | RESPIRATORY_TRACT | Status: DC | PRN

## 2020-11-14 MED ORDER — SODIUM CHLORIDE 0.9 % IV SOLN: 250.0000 mL | INTRAVENOUS | Status: DC | PRN

## 2020-11-14 MED ORDER — LOSARTAN POTASSIUM 25 MG PO TABS
25.0000 mg | ORAL_TABLET | Freq: Every day | ORAL | Status: DC
  Administered 2020-11-15 – 2020-11-16 (×2): 25 mg via ORAL
  Filled 2020-11-14 (×2): qty 1

## 2020-11-14 MED ORDER — HEPARIN (PORCINE) 25000 UT/250ML-% IV SOLN
1350.0000 [IU]/h | INTRAVENOUS | Status: DC
  Administered 2020-11-14: 1100 [IU]/h via INTRAVENOUS
  Filled 2020-11-14 (×2): qty 250

## 2020-11-14 MED ORDER — TICAGRELOR 90 MG PO TABS
90.0000 mg | ORAL_TABLET | Freq: Two times a day (BID) | ORAL | Status: DC
  Administered 2020-11-14 – 2020-11-16 (×3): 90 mg via ORAL
  Filled 2020-11-14 (×3): qty 1

## 2020-11-14 MED ORDER — FINASTERIDE 5 MG PO TABS
5.0000 mg | ORAL_TABLET | Freq: Every day | ORAL | Status: DC
  Administered 2020-11-15 – 2020-11-16 (×2): 5 mg via ORAL
  Filled 2020-11-14 (×3): qty 1

## 2020-11-14 MED ORDER — SPIRONOLACTONE 25 MG PO TABS
25.0000 mg | ORAL_TABLET | Freq: Every day | ORAL | Status: DC
  Administered 2020-11-16: 25 mg via ORAL
  Filled 2020-11-14 (×4): qty 1

## 2020-11-14 MED ORDER — ASPIRIN EC 81 MG PO TBEC
81.0000 mg | DELAYED_RELEASE_TABLET | Freq: Every day | ORAL | Status: DC
  Administered 2020-11-16: 81 mg via ORAL
  Filled 2020-11-14: qty 1

## 2020-11-14 MED ORDER — GABAPENTIN 400 MG PO CAPS
800.0000 mg | ORAL_CAPSULE | Freq: Three times a day (TID) | ORAL | Status: DC
  Administered 2020-11-14 – 2020-11-16 (×4): 800 mg via ORAL
  Filled 2020-11-14 (×4): qty 2
  Filled 2020-11-14: qty 8
  Filled 2020-11-14: qty 2

## 2020-11-14 MED ORDER — ONDANSETRON HCL 4 MG PO TABS: 4.0000 mg | ORAL_TABLET | Freq: Three times a day (TID) | ORAL | Status: DC | PRN

## 2020-11-14 MED ORDER — ISOSORBIDE MONONITRATE ER 60 MG PO TB24
60.0000 mg | ORAL_TABLET | Freq: Every day | ORAL | Status: DC
  Administered 2020-11-15 – 2020-11-16 (×2): 60 mg via ORAL
  Filled 2020-11-14 (×2): qty 1

## 2020-11-14 MED ORDER — NITROGLYCERIN 0.4 MG SL SUBL: 0.4000 mg | SUBLINGUAL_TABLET | SUBLINGUAL | Status: DC | PRN

## 2020-11-14 MED ORDER — VITAMIN B-12 1000 MCG PO TABS
1000.0000 ug | ORAL_TABLET | Freq: Every day | ORAL | Status: DC
  Administered 2020-11-15 – 2020-11-16 (×2): 1000 ug via ORAL
  Filled 2020-11-14 (×3): qty 1

## 2020-11-14 NOTE — Consult Note (Signed)
ANTICOAGULATION CONSULT NOTE - Initial Consult  Pharmacy Consult for heparin  Indication: chest pain/ACS  Allergies  Allergen Reactions   Amitriptyline Other (See Comments)    Pt unsure of what happens Other reaction(s): Other (See Comments) Pt unsure of what happens Pt unsure of what happens   Cyclobenzaprine Other (See Comments) and Hives    Pt unsure what hapens Pt unsure what hapens Pt unsure what hapens    Dicyclomine Shortness Of Breath and Swelling   Sacubitril-Valsartan Anaphylaxis and Itching   Acetaminophen    Amitriptyline Hcl    Berniece Salines Flavor Diarrhea   Hydrocodone    Fish Oil Itching   Lidocaine Nausea And Vomiting, Other (See Comments) and Nausea Only    Nasal Spray (only time had reaction) Other reaction(s): Dizziness Nasal Spray (only time had reaction)   Omega-3 Fatty Acids-Vitamin E Itching   Other Diarrhea and Itching    Reactive agents: Onions, bacon, steak   Oxycodone-Acetaminophen Other (See Comments)    Lowers BP Other reaction(s): Other (See Comments) Lowers BP Lowers BP   Sulfa Antibiotics Itching    Patient Measurements: Height: '5\' 8"'$  (172.7 cm) Weight: 90.7 kg (200 lb) IBW/kg (Calculated) : 68.4 Heparin Dosing Weight: 87.1 kg  Vital Signs: Temp: 98 F (36.7 C) (07/26 1701) Temp Source: Oral (07/26 1701) BP: 138/84 (07/26 1800) Pulse Rate: 57 (07/26 1800)  Labs: Recent Labs    11/14/20 1710  HGB 14.8  HCT 43.3  PLT 195  CREATININE 1.07  TROPONINIHS 8    Estimated Creatinine Clearance: 71.2 mL/min (by C-G formula based on SCr of 1.07 mg/dL).   Medical History: Past Medical History:  Diagnosis Date   Anxiety    Arthritis    Barrett's esophagus    Bowel obstruction ([place]) 02/2009   small bowel   Chronic chest pain    Coronary artery disease    a. 1993 CABG; b. 2007 Redo CABG; c. 09/2015 Cath: LM mild dzs, LAD 100ost, RI mild dzs, LCX 60p, OM1 100, RCA 100ost, VG->OM2 min irregs, LIMA->LAD nl, VG->RPDA 30ost, 20d, nl  EF->Med rx.   Depression    Diverticulitis    Diverticulosis    Erectile dysfunction    Fibromyalgia    Gastric ulcer    Gastritis    GERD (gastroesophageal reflux disease)    Headache    Hemorrhoids    HFrEF (heart failure with reduced ejection fraction) ([place])    a. 09/2016 Echo: EF 55-60%, no rwma, triv AI, mild MR, mildly dil LA. -> as of April 2020: EF 40 to 45%.  Septal dyssynergy/hypokinesis due to postop state but otherwise unable to assess wall motion [name] due to poor study.   History of myocardial infarct at age less than 31 years    Hyperlipidemia    Hypertension    Kidney stones    going to [facility]  June 26 to see kidney specialist   Lightheadedness    Mass on back    Nephrolithiasis    Orthostatic hypotension    a. Improved after discontinuation of metoprolol.   OSA (obstructive sleep apnea)    a. On CPAP.   Pulmonary embolism ([place]) 01/2006   Post-op/treated   Skin lesions, generalized    Stroke [facility]) 1993   right side weakness no blood thinners   on Aspirin   Syncope and collapse    Urticaria     Medications:  Brilinta 90 mg BID and ASA 81 mg daily   Assessment: 70 y.o. male referred to  ED from cardiology office for complaints of chest pain and shortness of breath. Pharmacy has been consulted for heparin dosing for ACS.  Heparin Dosing Weight: 87.1 kg H/H and plts WNL  Baseline labs:  aPTT: 28 INR: 0.9  Goal of Therapy:  Heparin level 0.3-0.7 units/ml Monitor platelets by anticoagulation protocol: Yes   Plan:  Give 4000 units bolus x 1 Start heparin infusion at 1100 units/hr Check anti-Xa level in 6 hours and daily while on heparin Continue to monitor H&H and platelets  Darnelle Bos, PharmD 11/14/2020,6:23 PM

## 2020-11-14 NOTE — Progress Notes (Signed)
Spoke with ED charge nurse. Patient to be transported there with plans for cath tomorrow with Dr. Saunders Revel.

## 2020-11-14 NOTE — ED Notes (Signed)
Pharmacy contacted due to no heparin infusion available at this time, pharmacy states they will send ASAP

## 2020-11-14 NOTE — ED Notes (Signed)
Pt presents to ED with c/o of being sent from Dr. Gwenyth Ober office for a chest pain work up. Pt states there was also SOB accompanied with this chest pain. Pt also states both arms are "weird feeling". Pt is A&Ox4. NAD noted at this time. Pt placed on cardiac monitor, IV placed, PA at bedside. Pt denies N/V/D. Pt states HX of 3 stents placed in March.

## 2020-11-14 NOTE — Patient Instructions (Addendum)
Medication Instructions:  Please stop the furosemide  Please start torsemide 40 mg twice a day for three days With potassium 2 in the AM and 2 in the PM  Then down to torsemide 40 mg daily start Saturday With 2 potassium a day  If you need a refill on your cardiac medications before your next appointment, please call your pharmacy.    Lab work: No new labs needed   If you have labs (blood work) drawn today and your tests are completely normal, you will receive your results only by: [place] (if you have MyChart) OR A paper copy in the mail If you have any lab test that is abnormal or we need to change your treatment, we will call you to review the results.   Testing/Procedures: No new testing needed   Follow-Up: At [facility], you and your health needs are our priority.  As part of our continuing mission to provide you with exceptional heart care, we have created designated Provider Care Teams.  These Care Teams include your primary Cardiologist (physician) and Advanced Practice Providers (APPs -  Physician Assistants and Nurse Practitioners) who all work together to provide you with the care you need, when you need it.  You will need a follow up appointment in 2 weeks with [name] or Dr. Fletcher Anon  Providers on your designated Care Team:   Murray Hodgkins, NP Christell Faith, PA-C Marrianne Mood, PA-C Cadence Kathlen Mody, [place]  Any Other Special Instructions Will Be Listed Below (If Applicable).  COVID-19 Vaccine Information can be found at: [email or web address] For questions related to vaccine distribution or appointments, please email [email or web address] or call [phone].

## 2020-11-14 NOTE — H&P (Signed)
History and Physical    Willie Clark [id number] DOB: [birth year] DOA: 11/14/2020  PCP: [name], MD (Inactive)    Patient coming from:  Dr.Golans office.    Chief Complaint:  Chest pain/ sob   HPI: Willie Clark is a 70 y.o. male seen in ed with complaints of chest pain and shortness of breath patient being sent to [place] from cardiology office for his presentation.   Duration:few months and worse since yesterday.  Frequency:intermittent  Location:left side of chest   Quality:sharp  Rate: 8/10  Radiation: radiating to btoh his arms  Aggravating: nothing   Alleviating:NTG.  Associated factors:sob/ nausea .  Pt has past medical history of anxiety, arthritis, CAD last stent in march this year , GERD, Gastritis,Headache, hemorrhoids, CHF, HTN, kidney stones,PE, and Stroke.   ED Course:  Vitals:   11/14/20 1659 11/14/20 1701 11/14/20 1800  BP:  (!) 137/93 138/84  Pulse:  62 (!) 57  Resp:  20 17  Temp:  98 F (36.7 C)   TempSrc:  Oral   SpO2:  95% 95%  Weight: 90.7 kg    Height: '5\' 8"'$  (1.727 m)    Pt in ed is alert and awake and afebrile, and  BP is elevated and started on heparin drip. Initial troponin is 8, BNP of 20.8, glucose of 111 on CMP, CBC within normal limits.  Respiratory panel negative for flu and COVID.  EKG sinus rhythm 70 with nonspecific ST changes PVCs.   Review of Systems:  Review of Systems  All other systems reviewed and are negative.   Past Medical History:  Diagnosis Date   Anxiety    Arthritis    Barrett's esophagus    Bowel obstruction ([place]) 02/2009   small bowel   Chronic chest pain    Coronary artery disease    a. 1993 CABG; b. 2007 Redo CABG; c. 09/2015 Cath: LM mild dzs, LAD 100ost, RI mild dzs, LCX 60p, OM1 100, RCA 100ost, VG->OM2 min irregs, LIMA->LAD nl, VG->RPDA 30ost, 20d, nl EF->Med rx.   Depression    Diverticulitis    Diverticulosis    Erectile dysfunction    Fibromyalgia    Gastric ulcer    Gastritis     GERD (gastroesophageal reflux disease)    Headache    Hemorrhoids    HFrEF (heart failure with reduced ejection fraction) ([place])    a. 09/2016 Echo: EF 55-60%, no rwma, triv AI, mild MR, mildly dil LA. -> as of April 2020: EF 40 to 45%.  Septal dyssynergy/hypokinesis due to postop state but otherwise unable to assess wall motion [name] due to poor study.   History of myocardial infarct at age less than 89 years    Hyperlipidemia    Hypertension    Kidney stones    going to [facility]  June 26 to see kidney specialist   Lightheadedness    Mass on back    Nephrolithiasis    Orthostatic hypotension    a. Improved after discontinuation of metoprolol.   OSA (obstructive sleep apnea)    a. On CPAP.   Pulmonary embolism ([place]) 01/2006   Post-op/treated   Skin lesions, generalized    Stroke [facility]) 1993   right side weakness no blood thinners   on Aspirin   Syncope and collapse    Urticaria     Past Surgical History:  Procedure Laterality Date   BACK SURGERY     bowel obstruction  01/2009   CARDIAC CATHETERIZATION  11/2006   patent grafts. Significant OM3 disease and occluded diagonals.   CARDIAC CATHETERIZATION  06/2010   patent grafts. significant ISR in proximal LCX but OM2 is bypassed and gives retrograde flow to OM3.    CARDIAC CATHETERIZATION  06/2013   [facility]: Patent grafts with 60% proximal in-stent restenosis in the left circumflex with FFR of 0.85   CARDIAC CATHETERIZATION Left 09/25/2015   Procedure: Left Heart Cath and Cors/Grafts Angiography;  Surgeon: Wellington Hampshire, MD;  Location: [place] CV [facility];  Service: Cardiovascular;  Laterality: Left;   CERVICAL FUSION     CHOLECYSTECTOMY     COLON SURGERY  01/2009   BOWEL RESECTION DUE TO SMALL BOWEL OBSTRUCTION   COLONOSCOPY     COLONOSCOPY WITH PROPOFOL N/A 10/28/2017   Procedure: COLONOSCOPY WITH PROPOFOL;  Surgeon: Lollie Sails, MD;  Location: [facility] ENDOSCOPY;  Service: Endoscopy;  Laterality: N/A;   CORONARY  ANGIOPLASTY  2006   CORONARY ARTERY BYPASS GRAFT  1993/01/2006   redo at [facility]. LIMA to LAD, SVG to OM2 and SVG to RPDA   CORONARY STENT INTERVENTION N/A 06/20/2020   Procedure: CORONARY STENT INTERVENTION;  Surgeon: Nelva Bush, MD;  Location: [place] CV LAB;  Service: Cardiovascular;  Laterality: N/A;   CYSTOSCOPY WITH STENT PLACEMENT Bilateral 09/24/2015   Procedure: CYSTOSCOPY WITH BILATERAL RETROGRADES, BILATERAL STENT PLACEMENT;  Surgeon: Festus Aloe, MD;  Location: [facility] ORS;  Service: Urology;  Laterality: Bilateral;   ESOPHAGOGASTRODUODENOSCOPY N/A 10/25/2014   Procedure: ESOPHAGOGASTRODUODENOSCOPY (EGD);  Surgeon: Lollie Sails, MD;  Location: [facility] ENDOSCOPY;  Service: Endoscopy;  Laterality: N/A;   ESOPHAGOGASTRODUODENOSCOPY (EGD) WITH PROPOFOL N/A 02/03/2017   Procedure: ESOPHAGOGASTRODUODENOSCOPY (EGD) WITH PROPOFOL;  Surgeon: Lollie Sails, MD;  Location: [facility] ENDOSCOPY;  Service: Endoscopy;  Laterality: N/A;   ESOPHAGOGASTRODUODENOSCOPY (EGD) WITH PROPOFOL N/A 04/18/2017   Procedure: ESOPHAGOGASTRODUODENOSCOPY (EGD) WITH PROPOFOL;  Surgeon: Lollie Sails, MD;  Location: [facility] ENDOSCOPY;  Service: Endoscopy;  Laterality: N/A;   EYE SURGERY Bilateral    Cataract Extraction with IOL   HERNIA REPAIR     INGUINAL HERNIA REPAIR Bilateral 01/23/2016   Procedure: HERNIA REPAIR INGUINAL ADULT BILATERAL;  Surgeon: Leonie Green, MD;  Location: [facility] ORS;  Service: General;  Laterality: Bilateral;   LEFT HEART CATH AND CORS/GRAFTS ANGIOGRAPHY N/A 12/13/2019   Procedure: LEFT HEART CATH AND CORS/GRAFTS ANGIOGRAPHY;  Surgeon: Wellington Hampshire, MD;  Location: [place] CV LAB;  Service: Cardiovascular;  Laterality: N/A;   LEFT HEART CATH AND CORS/GRAFTS ANGIOGRAPHY N/A 06/20/2020   Procedure: LEFT HEART CATH AND CORS/GRAFTS ANGIOGRAPHY;  Surgeon: Nelva Bush, MD;  Location: [place] CV LAB;  Service: Cardiovascular;  Laterality: N/A;   LUMBAR  LAMINECTOMY/DECOMPRESSION MICRODISCECTOMY Left 10/01/2016   Procedure: Left Lumbar four-five Laminotomy for resection of synovial cyst;  Surgeon: Erline Levine, MD;  Location: [place];  Service: Neurosurgery;  Laterality: Left;  left   NECK SURGERY  06/2009   SHOULDER SURGERY  2010     reports that he quit smoking about 29 years ago. His smoking use included cigarettes. He smoked an average of 2.5 packs per day. He has never used smokeless tobacco. He reports previous alcohol use. He reports that he does not use drugs.  Allergies  Allergen Reactions   Amitriptyline Other (See Comments)    Pt unsure of what happens Other reaction(s): Other (See Comments) Pt unsure of what happens Pt unsure of what happens   Cyclobenzaprine Other (See Comments) and Hives    Pt unsure what  hapens Pt unsure what hapens Pt unsure what hapens    Dicyclomine Shortness Of Breath and Swelling   Sacubitril-Valsartan Anaphylaxis and Itching   Acetaminophen    Amitriptyline Hcl    Berniece Salines Flavor Diarrhea   Hydrocodone    Fish Oil Itching   Lidocaine Nausea And Vomiting, Other (See Comments) and Nausea Only    Nasal Spray (only time had reaction) Other reaction(s): Dizziness Nasal Spray (only time had reaction)   Omega-3 Fatty Acids-Vitamin E Itching   Other Diarrhea and Itching    Reactive agents: Onions, bacon, steak   Oxycodone-Acetaminophen Other (See Comments)    Lowers BP Other reaction(s): Other (See Comments) Lowers BP Lowers BP   Sulfa Antibiotics Itching    Family History  Problem Relation Age of Onset   Heart disease Father 56   Heart attack Father     Prior to Admission medications   Medication Sig Start Date End Date Taking? Authorizing Provider  Acetaminophen (TYLENOL EXTRA STRENGTH PO) Take 6 tablets by mouth daily at 2 am.    [provider]  albuterol (PROVENTIL HFA;VENTOLIN HFA) 108 (90 Base) MCG/ACT inhaler Inhale into the lungs every 4 (four) hours as needed for wheezing  or shortness of breath.    [provider]  aspirin EC 81 MG EC tablet Take 1 tablet (81 mg total) by mouth daily. Swallow whole. 06/23/20   Nolberto Hanlon, MD  carvedilol (COREG) 3.125 MG tablet Take 1 tablet (3.125 mg total) by mouth 2 (two) times daily with a meal. 07/03/20 11/14/20  Wellington Hampshire, MD  citalopram (CELEXA) 20 MG tablet Take 20 mg by mouth daily.     [provider]  dapagliflozin propanediol (FARXIGA) 10 MG TABS tablet Take 1 tablet (10 mg total) by mouth daily before breakfast. 11/02/20   Wellington Hampshire, MD  EPINEPHrine 0.3 mg/0.3 mL IJ SOAJ injection Inject 0.3 mg into the muscle as needed.  12/27/19   [provider]  Erenumab-aooe 140 MG/ML SOAJ Inject 140 mg into the skin every 28 (twenty-eight) days.    [provider]  finasteride (PROSCAR) 5 MG tablet Take 5 mg by mouth daily.    [provider]  furosemide (LASIX) 40 MG tablet TAKE 1 TABLET BY MOUTH EVERY DAY 10/26/20   Wellington Hampshire, MD  gabapentin (NEURONTIN) 800 MG tablet Take 1 tablet by mouth 3 (three) times daily. 12/22/19   [provider]  isosorbide mononitrate (IMDUR) 60 MG 24 hr tablet Take 1 tablet (60 mg total) by mouth daily. 11/06/20   Furth, Cadence H, PA-C  losartan (COZAAR) 25 MG tablet TAKE 1 TABLET EVERY DAY 10/05/20   Wellington Hampshire, MD  nitroGLYCERIN (NITROSTAT) 0.4 MG SL tablet Place 1 tablet (0.4 mg total) under the tongue every 5 (five) minutes as needed for chest pain. 06/22/20 11/14/20  Nolberto Hanlon, MD  ondansetron (ZOFRAN) 4 MG tablet Take 4 mg by mouth every 8 (eight) hours as needed for nausea or vomiting.    [provider]  pantoprazole (PROTONIX) 40 MG tablet Take 40 mg by mouth 2 (two) times daily.     [provider]  Peppermint Oil (IBGARD PO) Take by mouth daily at 2 am.    [provider]  potassium chloride (KLOR-CON) 10 MEQ tablet Take 1 tablet (10 mEq total) by mouth daily. 08/07/20 11/14/20  Furth, Cadence  H, PA-C  Probiotic Product (PROBIOTIC PO) Take by mouth daily at 8 pm.    [provider]  rOPINIRole (REQUIP) 1 MG tablet Take 1 mg by mouth 2 (two) times daily. 05/18/20   [provider]  rosuvastatin (CRESTOR) 40 MG tablet Take 1 tablet (40 mg total) by mouth daily. 05/19/20   Wellington Hampshire, MD  spironolactone (ALDACTONE) 25 MG tablet Take 1 tablet (25 mg total) by mouth daily. 08/25/20 11/23/20  Furth, Cadence H, PA-C  ticagrelor (BRILINTA) 90 MG TABS tablet Take 90 mg by mouth 2 (two) times daily.    [provider]  VASCEPA 1 g capsule TAKE 2 CAPSULES TWICE DAILY 07/12/20   Wellington Hampshire, MD  vitamin B-12 (CYANOCOBALAMIN) 1000 MCG tablet Take 1,000 mcg by mouth daily.    [provider]    Physical Exam: Vitals:   11/14/20 1659 11/14/20 1701 11/14/20 1800  BP:  (!) 137/93 138/84  Pulse:  62 (!) 57  Resp:  20 17  Temp:  98 F (36.7 C)   TempSrc:  Oral   SpO2:  95% 95%  Weight: 90.7 kg    Height: '5\' 8"'$  (1.727 m)     Physical Exam Vitals and nursing note reviewed.  Constitutional:      General: He is not in acute distress.    Appearance: Normal appearance. He is not ill-appearing.  HENT:     Head: Normocephalic and atraumatic.     Right Ear: External ear normal.     Left Ear: External ear normal.     Nose: Nose normal.     Mouth/Throat:     Mouth: Mucous membranes are moist.  Eyes:     Extraocular Movements: Extraocular movements intact.     Pupils: Pupils are equal, round, and reactive to light.  Cardiovascular:     Rate and Rhythm: Normal rate and regular rhythm.     Pulses: Normal pulses.     Heart sounds: Normal heart sounds.  Pulmonary:     Effort: Pulmonary effort is normal.     Breath sounds: Normal breath sounds.  Abdominal:     General: Bowel sounds are normal. There is no distension.     Palpations: Abdomen is soft.     Tenderness: There is no abdominal tenderness. There is no guarding.  Musculoskeletal:     Right  lower leg: No edema.     Left lower leg: No edema.  Skin:    General: Skin is warm.  Neurological:     General: No focal deficit present.     Mental Status: He is alert and oriented to person, place, and time.  Psychiatric:        Mood and Affect: Mood normal.        Behavior: Behavior normal.    Labs on Admission: I have personally reviewed following labs and imaging studies  No results for input(s): CKTOTAL, CKMB, TROPONINI in the last 72 hours. Lab Results  Component Value Date   WBC 7.2 11/14/2020   HGB 14.8 11/14/2020   HCT 43.3 11/14/2020   MCV 96.4 11/14/2020   PLT 195 11/14/2020    Recent Labs  Lab 11/14/20 1710  NA 138  K 4.1  CL 99  CO2 29  BUN 11  CREATININE 1.07  CALCIUM 9.3  PROT 6.9  BILITOT 0.9  ALKPHOS 52  ALT 23  AST 27  GLUCOSE 111*   Lab Results  Component Value Date   CHOL 165 02/07/2020   HDL 41 02/07/2020   LDLCALC 61 02/07/2020   TRIG 317 (H) 02/07/2020  COVID-19 Labs No results for input(s): DDIMER, FERRITIN, LDH, CRP in the last 72 hours. Lab Results  Component Value Date   SARSCOV2NAA NEGATIVE 11/14/2020   [place] NEGATIVE 06/20/2020   [place] NEGATIVE 12/12/2019    Radiological Exams on Admission: DG Chest 2 View  Result Date: 11/14/2020 CLINICAL DATA:  Chest pain, shortness of breath. EXAM: CHEST - 2 VIEW COMPARISON:  August 11, 2020. FINDINGS: Similar cardiomediastinal silhouette. Prior CABG with median sternotomy. No consolidation. No visible pleural effusions or pneumothorax. Partially imaged cervical ACDF. IMPRESSION: No evidence of acute cardiopulmonary disease. Electronically Signed   By: Margaretha Sheffield MD   On: 11/14/2020 18:56    EKG: Independently reviewed.  EKG sinus rhythm 70 with nonspecific ST changes PVCs.   Assessment/Plan Active Problems:   Coronary artery disease involving native coronary artery of native heart with angina pectoris (HCC)   Essential hypertension   GERD (gastroesophageal  reflux disease)   HFrEF (heart failure with reduced ejection fraction) (HCC) Coronary artery disease with angina: -We will admit patient to progressive cardiac unit and cardiology following. -Patient started on a heparin drip per cardiology instruction for his known heart disease, history of bypass x2 and ongoing chest discomfort with acute worsening since yesterday.   -Nitroglycerin, morphine and supplemental oxygen as needed to keep goal sats in the 90s.   -N.p.o. after midnight for left heart cath in the morning. -Patient continued on aspirin 81, Brilinta, Imdur, Coreg, Crestor.   Hypertension: Blood pressure 114/75, pulse (!) 58, temperature 98 F (36.7 C), temperature source Oral, resp. rate 16, height '5\' 8"'$  (1.727 m), weight 90.7 kg, SpO2 95 %. -Patient continued on losartan, Coreg.  GERD: -IV PPI therapy.   Reduced ejection fraction heart failure: -As needed Lasix per a.m. MD as needed, avoiding contrast studies for catheterization procedure and nephro protection.  -Also held patient's Farxiga. -Strict I's and O's,   DVT prophylaxis:  Heparin drip  Code Status:  Full code  Family Communication:  Garfinkle,Janice A (Spouse)  [phone] (Mobile)   Disposition Plan:  Home.  Consults called:  Dr.End.   Admission status: Inpatient.    Para Skeans MD [facility] [phone] How to contact the [facility] Attending or Consulting provider [place] or covering provider during after hours [place], for this patient.    Check the care team in [facility] and look for a) attending/consulting [facility] provider listed and b) the [facility] team listed Log into [email or web address] and use [place]'s universal password to access. If you do not have the password, please contact the hospital operator. Locate the [facility] provider you are looking for under [facility] and page to a number that you can be directly reached. If you still have difficulty reaching the provider, please page the [facility]  (Director on Call) for the Hospitalists listed on amion for assistance. [email or web address] Password [email or web address] 11/14/2020, 7:20 PM

## 2020-11-14 NOTE — ED Notes (Signed)
Pt at XRAY

## 2020-11-14 NOTE — ED Provider Notes (Signed)
[facility] Emergency Department Provider Note  ____________________________________________   Event Date/Time   First MD Initiated Contact with Patient 11/14/20 1707     (approximate)  I have reviewed the triage vital signs and the nursing notes.   HISTORY  Chief Complaint Chest Pain  HPI Willie Clark is a 70 y.o. male who presents to the emergency department for evaluation of shortness of breath and chest pain.  The patient states that he had multiple coronary stents placed in March, has had continued shortness of breath since that time.  He was seen cardiologist Dr. Arta Bruce today and noted an episode of severe chest pain.  He does report associated nausea, denies vomiting.  He states that his chest pain has now resolved, but continues to have shortness of breath.  He states that the cardiologist office and sent him with plans for heparin and cardiac cath tomorrow.  He denies any cough, nasal congestion or recent illness exposures.         Past Medical History:  Diagnosis Date   Anxiety    Arthritis    Barrett's esophagus    Bowel obstruction ([place]) 02/2009   small bowel   Chronic chest pain    Coronary artery disease    a. 1993 CABG; b. 2007 Redo CABG; c. 09/2015 Cath: LM mild dzs, LAD 100ost, RI mild dzs, LCX 60p, OM1 100, RCA 100ost, VG->OM2 min irregs, LIMA->LAD nl, VG->RPDA 30ost, 20d, nl EF->Med rx.   Depression    Diverticulitis    Diverticulosis    Erectile dysfunction    Fibromyalgia    Gastric ulcer    Gastritis    GERD (gastroesophageal reflux disease)    Headache    Hemorrhoids    HFrEF (heart failure with reduced ejection fraction) ([place])    a. 09/2016 Echo: EF 55-60%, no rwma, triv AI, mild MR, mildly dil LA. -> as of April 2020: EF 40 to 45%.  Septal dyssynergy/hypokinesis due to postop state but otherwise unable to assess wall motion [name] due to poor study.   History of myocardial infarct at age less than 32 years     Hyperlipidemia    Hypertension    Kidney stones    going to [facility]  June 26 to see kidney specialist   Lightheadedness    Mass on back    Nephrolithiasis    Orthostatic hypotension    a. Improved after discontinuation of metoprolol.   OSA (obstructive sleep apnea)    a. On CPAP.   Pulmonary embolism ([place]) 01/2006   Post-op/treated   Skin lesions, generalized    Stroke [facility]) 1993   right side weakness no blood thinners   on Aspirin   Syncope and collapse    Urticaria     Patient Active Problem List   Diagnosis Date Noted   Chest pain due to coronary artery disease ([place]) 11/14/2020   HFrEF (heart failure with reduced ejection fraction) ([place])    Acute coronary syndrome ([place]) 06/20/2020   NSTEMI (non-ST elevated myocardial infarction) ([place]) 06/20/2020   GERD (gastroesophageal reflux disease) 06/20/2020   Unstable angina ([place]) 12/12/2019   Hypokalemia 12/12/2019   Depression 12/12/2019   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   Chest pain 07/31/2018   Carpal tunnel syndrome 04/06/2018   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   Synovial cyst of lumbar facet joint 10/01/2016   Kidney stones    Ureteral obstruction    Obstructive uropathy 09/24/2015   Abnormal stress test    Atherosclerosis  of coronary artery bypass graft of native heart    Chronic combined systolic and diastolic congestive heart failure [facility])    Essential hypertension    Preoperative cardiovascular examination    Bilateral leg edema 01/10/2015   TIA (transient ischemic attack) 08/13/2014   Intermittent claudication ([place]) 06/07/2013   Abdominal pain 05/16/2012   Dyspnea 11/21/2011   Sinus bradycardia 07/12/2010   ED (erectile dysfunction) 07/12/2010   Coronary artery disease involving native coronary artery of native heart with angina pectoris ([place])    Hyperlipidemia    Pulmonary embolism ([place]) 01/2006    Past Surgical History:  Procedure Laterality Date   BACK SURGERY     bowel obstruction  01/2009   CARDIAC  CATHETERIZATION  11/2006   patent grafts. Significant OM3 disease and occluded diagonals.   CARDIAC CATHETERIZATION  06/2010   patent grafts. significant ISR in proximal LCX but OM2 is bypassed and gives retrograde flow to OM3.    CARDIAC CATHETERIZATION  06/2013   [facility]: Patent grafts with 60% proximal in-stent restenosis in the left circumflex with FFR of 0.85   CARDIAC CATHETERIZATION Left 09/25/2015   Procedure: Left Heart Cath and Cors/Grafts Angiography;  Surgeon: Wellington Hampshire, MD;  Location: [place] CV LAB;  Service: Cardiovascular;  Laterality: Left;   CERVICAL FUSION     CHOLECYSTECTOMY     COLON SURGERY  01/2009   BOWEL RESECTION DUE TO SMALL BOWEL OBSTRUCTION   COLONOSCOPY     COLONOSCOPY WITH PROPOFOL N/A 10/28/2017   Procedure: COLONOSCOPY WITH PROPOFOL;  Surgeon: Lollie Sails, MD;  Location[facility] ENDOSCOPY;  Service: Endoscopy;  Laterality: N/A;   CORONARY ANGIOPLASTY  2006   CORONARY ARTERY BYPASS GRAFT  1993/01/2006   redo at [facility]. LIMA to LAD, SVG to OM2 and SVG to RPDA   CORONARY STENT INTERVENTION N/A 06/20/2020   Procedure: CORONARY STENT INTERVENTION;  Surgeon: Nelva Bush, MD;  Location: [place] CV LAB;  Service: Cardiovascular;  Laterality: N/A;   CYSTOSCOPY WITH STENT PLACEMENT Bilateral 09/24/2015   Procedure: CYSTOSCOPY WITH BILATERAL RETROGRADES, BILATERAL STENT PLACEMENT;  Surgeon: Festus Aloe, MD;  Location: [facility] ORS;  Service: Urology;  Laterality: Bilateral;   ESOPHAGOGASTRODUODENOSCOPY N/A 10/25/2014   Procedure: ESOPHAGOGASTRODUODENOSCOPY (EGD);  Surgeon: Lollie Sails, MD;  Location: [facility] ENDOSCOPY;  Service: Endoscopy;  Laterality: N/A;   ESOPHAGOGASTRODUODENOSCOPY (EGD) WITH PROPOFOL N/A 02/03/2017   Procedure: ESOPHAGOGASTRODUODENOSCOPY (EGD) WITH PROPOFOL;  Surgeon: Lollie Sails, MD;  Location: [facility] ENDOSCOPY;  Service: Endoscopy;  Laterality: N/A;   ESOPHAGOGASTRODUODENOSCOPY (EGD) WITH PROPOFOL N/A 04/18/2017   Procedure:  ESOPHAGOGASTRODUODENOSCOPY (EGD) WITH PROPOFOL;  Surgeon: Lollie Sails, MD;  Location: [facility] ENDOSCOPY;  Service: Endoscopy;  Laterality: N/A;   EYE SURGERY Bilateral    Cataract Extraction with IOL   HERNIA REPAIR     INGUINAL HERNIA REPAIR Bilateral 01/23/2016   Procedure: HERNIA REPAIR INGUINAL ADULT BILATERAL;  Surgeon: Leonie Green, MD;  Location: [facility] ORS;  Service: General;  Laterality: Bilateral;   LEFT HEART CATH AND CORS/GRAFTS ANGIOGRAPHY N/A 12/13/2019   Procedure: LEFT HEART CATH AND CORS/GRAFTS ANGIOGRAPHY;  Surgeon: Wellington Hampshire, MD;  Location: [place] CV LAB;  Service: Cardiovascular;  Laterality: N/A;   LEFT HEART CATH AND CORS/GRAFTS ANGIOGRAPHY N/A 06/20/2020   Procedure: LEFT HEART CATH AND CORS/GRAFTS ANGIOGRAPHY;  Surgeon: Nelva Bush, MD;  Location: [place] CV LAB;  Service: Cardiovascular;  Laterality: N/A;   LUMBAR LAMINECTOMY/DECOMPRESSION MICRODISCECTOMY Left 10/01/2016   Procedure: Left Lumbar four-five Laminotomy for resection of  synovial cyst;  Surgeon: Erline Levine, MD;  Location: [place];  Service: Neurosurgery;  Laterality: Left;  left   NECK SURGERY  06/2009   SHOULDER SURGERY  2010    Prior to Admission medications   Medication Sig Start Date End Date Taking? Authorizing Provider  albuterol (PROVENTIL HFA;VENTOLIN HFA) 108 (90 Base) MCG/ACT inhaler Inhale into the lungs every 4 (four) hours as needed for wheezing or shortness of breath.    [provider]  aspirin EC 81 MG EC tablet Take 1 tablet (81 mg total) by mouth daily. Swallow whole. 06/23/20   Nolberto Hanlon, MD  carvedilol (COREG) 3.125 MG tablet Take 1 tablet (3.125 mg total) by mouth 2 (two) times daily with a meal. 07/03/20 11/14/20  Wellington Hampshire, MD  citalopram (CELEXA) 20 MG tablet Take 20 mg by mouth daily.     [provider]  dapagliflozin propanediol (FARXIGA) 10 MG TABS tablet Take 1 tablet (10 mg total) by mouth daily before breakfast. 11/02/20    Wellington Hampshire, MD  EPINEPHrine 0.3 mg/0.3 mL IJ SOAJ injection Inject 0.3 mg into the muscle as needed.  12/27/19   [provider]  Erenumab-aooe 140 MG/ML SOAJ Inject 140 mg into the skin every 28 (twenty-eight) days.    [provider]  finasteride (PROSCAR) 5 MG tablet Take 5 mg by mouth daily.    [provider]  furosemide (LASIX) 40 MG tablet TAKE 1 TABLET BY MOUTH EVERY DAY 10/26/20   Wellington Hampshire, MD  gabapentin (NEURONTIN) 800 MG tablet Take 1 tablet by mouth 3 (three) times daily. 12/22/19   [provider]  isosorbide mononitrate (IMDUR) 60 MG 24 hr tablet Take 1 tablet (60 mg total) by mouth daily. 11/06/20   Furth, Cadence H, PA-C  losartan (COZAAR) 25 MG tablet TAKE 1 TABLET EVERY DAY 10/05/20   Wellington Hampshire, MD  nitroGLYCERIN (NITROSTAT) 0.4 MG SL tablet Place 1 tablet (0.4 mg total) under the tongue every 5 (five) minutes as needed for chest pain. 06/22/20 11/14/20  Nolberto Hanlon, MD  ondansetron (ZOFRAN) 4 MG tablet Take 4 mg by mouth every 8 (eight) hours as needed for nausea or vomiting.    [provider]  pantoprazole (PROTONIX) 40 MG tablet Take 40 mg by mouth 2 (two) times daily.     [provider]  Peppermint Oil (IBGARD PO) Take by mouth daily at 2 am.    [provider]  potassium chloride (KLOR-CON) 10 MEQ tablet Take 1 tablet (10 mEq total) by mouth daily. 08/07/20 11/14/20  Furth, Cadence H, PA-C  Probiotic Product (PROBIOTIC PO) Take by mouth daily at 8 pm.    [provider]  rOPINIRole (REQUIP) 1 MG tablet Take 1 mg by mouth 2 (two) times daily. 05/18/20   [provider]  rosuvastatin (CRESTOR) 40 MG tablet Take 1 tablet (40 mg total) by mouth daily. 05/19/20   Wellington Hampshire, MD  spironolactone (ALDACTONE) 25 MG tablet Take 1 tablet (25 mg total) by mouth daily. 08/25/20 11/23/20  Furth, Cadence H, PA-C  ticagrelor (BRILINTA) 90 MG TABS tablet Take 90 mg by mouth 2 (two) times daily.     [provider]  VASCEPA 1 g capsule TAKE 2 CAPSULES TWICE DAILY 07/12/20   Wellington Hampshire, MD  vitamin B-12 (CYANOCOBALAMIN) 1000 MCG tablet Take 1,000 mcg by mouth daily.    [provider]    Allergies Amitriptyline, Cyclobenzaprine, Dicyclomine, Sacubitril-valsartan, Acetaminophen, Amitriptyline hcl, Berniece Salines  flavor, Hydrocodone, Fish oil, Lidocaine, Omega-3 fatty acids-vitamin e, Other, Oxycodone-acetaminophen, and Sulfa antibiotics  Family History  Problem Relation Age of Onset   Heart disease Father 4   Heart attack Father     Social History Social History   Tobacco Use   Smoking status: Former    Packs/day: 2.50    Types: Cigarettes    Quit date: 08/21/1991    Years since quitting: 29.2   Smokeless tobacco: Never  Vaping Use   Vaping Use: Never used  Substance Use Topics   Alcohol use: Not Currently   Drug use: No    Review of Systems Constitutional: No fever/chills Eyes: No visual changes. ENT: No sore throat. Cardiovascular: + chest pain. Respiratory: + shortness of breath. Gastrointestinal: No abdominal pain.  No nausea, no vomiting.  No diarrhea.  No constipation. Genitourinary: Negative for dysuria. Musculoskeletal: Negative for back pain. Skin: Negative for rash. Neurological: Negative for headaches, focal weakness or numbness.  ____________________________________________   PHYSICAL EXAM:  VITAL SIGNS: ED Triage Vitals  Enc Vitals Group     BP 11/14/20 1701 (!) 137/93     Pulse Rate 11/14/20 1701 62     Resp 11/14/20 1701 20     Temp 11/14/20 1701 98 F (36.7 C)     Temp Source 11/14/20 1701 Oral     SpO2 11/14/20 1701 95 %     Weight 11/14/20 1659 200 lb (90.7 kg)     Height 11/14/20 1659 '5\' 8"'$  (1.727 m)     Head Circumference --      Peak Flow --      Pain Score 11/14/20 1659 4     Pain Loc --      Pain Edu? --      Excl. in [place]? --    Constitutional: Alert and oriented. Well appearing and in no acute distress. Eyes:  Conjunctivae are normal. PERRL. EOMI. Head: Atraumatic. Nose: No congestion/rhinnorhea. Mouth/Throat: Mucous membranes are moist.  Oropharynx non-erythematous. Neck: No stridor.   Cardiovascular: Normal rate, regular rhythm. Grossly normal heart sounds.  Good peripheral circulation.  Very mild trace edema bilaterally. Respiratory: Normal respiratory effort.  No retractions. Lungs CTAB. Gastrointestinal: Soft and nontender. No distention. No abdominal bruits. No CVA tenderness. Musculoskeletal: No lower extremity tenderness nor edema.  No joint effusions. Neurologic:  Normal speech and language. No gross focal neurologic deficits are appreciated. No gait instability. Skin:  Skin is warm, dry and intact. No rash noted. Psychiatric: Mood and affect are normal. Speech and behavior are normal.  ____________________________________________   LABS (all labs ordered are listed, but only abnormal results are displayed)  Labs Reviewed  COMPREHENSIVE METABOLIC PANEL - Abnormal; Notable for the following components:      Result Value   Glucose, Bld 111 (*)    All other components within normal limits  RESP PANEL BY RT-PCR (FLU A&B, COVID) ARPGX2  CBC WITH DIFFERENTIAL/PLATELET  BRAIN NATRIURETIC PEPTIDE  APTT  PROTIME-INR  HEMOGLOBIN A1C  T4, FREE  TSH  HEPARIN LEVEL (UNFRACTIONATED)  TROPONIN I (HIGH SENSITIVITY)  TROPONIN I (HIGH SENSITIVITY)   ____________________________________________  EKG  Normal sinus rhythm with a rate of 67 bpm.  No ST segment elevations.  Normal QTC. ____________________________________________  RADIOLOGY I, Marlana Salvage, personally viewed and evaluated these images (plain radiographs) as part of my medical decision making, as well as reviewing the written report by the radiologist.  ED provider interpretation: Previous sternotomy wires in place.  No acute pneumonia or other  acute cardiopulmonary disease noted  Official radiology report(s): DG  Chest 2 View  Result Date: 11/14/2020 CLINICAL DATA:  Chest pain, shortness of breath. EXAM: CHEST - 2 VIEW COMPARISON:  August 11, 2020. FINDINGS: Similar cardiomediastinal silhouette. Prior CABG with median sternotomy. No consolidation. No visible pleural effusions or pneumothorax. Partially imaged cervical ACDF. IMPRESSION: No evidence of acute cardiopulmonary disease. Electronically Signed   By: Margaretha Sheffield MD   On: 11/14/2020 18:56      ____________________________________________   INITIAL IMPRESSION / ASSESSMENT AND PLAN / ED COURSE  As part of my medical decision making, I reviewed the following data within the [place] notes reviewed and incorporated, Labs reviewed, Radiograph reviewed, Discussed with admitting physician Dr. Posey Pronto, A consult was requested and obtained from this/these consultant(s) Cardiology, and Notes from prior ED visits        Patient is a 70 year old male who presents to the emergency department today from cardiology office for evaluation of shortness of breath with an acute episode of severe chest pain while in the office.  See HPI for further details.  In triage, patient has grossly normal vital signs.  Physical exam as above.  Laboratory evaluation including CBC, CMP, BNP, troponins are grossly within normal limits.  Patient was discussed via secure chat with attending cardiologist Dr. Saunders Revel, who did recommend starting the patient on heparin.  We will admit the patient to the hospitalist service with cardiology consulting on the patient at their recommendation.  Case was discussed with attending Dr. Posey Pronto who agrees to admit the patient.  He is stable at this time for the floor.      ____________________________________________   FINAL CLINICAL IMPRESSION(S) / ED DIAGNOSES  Final diagnoses:  Angina of effort ([place])  Shortness of breath     ED Discharge Orders     None        Note:  This document was prepared using  Dragon voice recognition software and may include unintentional dictation errors.    Marlana Salvage, PA 11/14/20 1943    Nance Pear, MD 11/14/20 [phone]

## 2020-11-14 NOTE — ED Triage Notes (Signed)
Pt sent from dr Candis Musa 's office with chest pain and sob today.  Former smoker.  No cough.  No fever  pt reports nausea.   Pt alert   speech clear.

## 2020-11-15 ENCOUNTER — Encounter
Admission: EM | Disposition: A | Discharge status: Home / Self Care | Payer: Self-pay | Source: Ambulatory Visit | Attending: Internal Medicine

## 2020-11-15 ENCOUNTER — Inpatient Hospital Stay (HOSPITAL_COMMUNITY)
Admit: 2020-11-15 | Discharge: 2020-11-15 | Disposition: A | Discharge status: Home / Self Care | Payer: Medicare HMO | Attending: Internal Medicine | Admitting: Internal Medicine

## 2020-11-15 ENCOUNTER — Other Ambulatory Visit: Payer: Self-pay

## 2020-11-15 DIAGNOSIS — I5020 Unspecified systolic (congestive) heart failure: Secondary | ICD-10-CM | POA: Diagnosis not present

## 2020-11-15 DIAGNOSIS — I200 Unstable angina: Secondary | ICD-10-CM | POA: Diagnosis not present

## 2020-11-15 DIAGNOSIS — I25110 Atherosclerotic heart disease of native coronary artery with unstable angina pectoris: Principal | ICD-10-CM

## 2020-11-15 DIAGNOSIS — I428 Other cardiomyopathies: Secondary | ICD-10-CM

## 2020-11-15 DIAGNOSIS — I25710 Atherosclerosis of autologous vein coronary artery bypass graft(s) with unstable angina pectoris: Secondary | ICD-10-CM

## 2020-11-15 DIAGNOSIS — I10 Essential (primary) hypertension: Secondary | ICD-10-CM

## 2020-11-15 DIAGNOSIS — I25119 Atherosclerotic heart disease of native coronary artery with unspecified angina pectoris: Secondary | ICD-10-CM | POA: Diagnosis not present

## 2020-11-15 HISTORY — PX: RIGHT/LEFT HEART CATH AND CORONARY/GRAFT ANGIOGRAPHY: CATH118267

## 2020-11-15 HISTORY — PX: CORONARY STENT INTERVENTION: CATH118234

## 2020-11-15 LAB — CBC
HCT: 41.2 % (ref 39.0–52.0)
Hemoglobin: 13.8 g/dL (ref 13.0–17.0)
MCH: 32.5 pg (ref 26.0–34.0)
MCHC: 33.5 g/dL (ref 30.0–36.0)
MCV: 96.9 fL (ref 80.0–100.0)
Platelets: 163 10*3/uL (ref 150–400)
RBC: 4.25 MIL/uL (ref 4.22–5.81)
RDW: 13.2 % (ref 11.5–15.5)
WBC: 5.3 10*3/uL (ref 4.0–10.5)
nRBC: 0 % (ref 0.0–0.2)

## 2020-11-15 LAB — POCT ACTIVATED CLOTTING TIME
Activated Clotting Time: 277 seconds
Activated Clotting Time: 289 seconds

## 2020-11-15 LAB — HEMOGLOBIN A1C
Hgb A1c MFr Bld: 6.1 % — ABNORMAL HIGH (ref 4.8–5.6)
Mean Plasma Glucose: 128 mg/dL

## 2020-11-15 LAB — HEPARIN LEVEL (UNFRACTIONATED): Heparin Unfractionated: 0.21 IU/mL — ABNORMAL LOW (ref 0.30–0.70)

## 2020-11-15 LAB — GLUCOSE, CAPILLARY: Glucose-Capillary: 105 mg/dL — ABNORMAL HIGH (ref 70–99)

## 2020-11-15 SURGERY — RIGHT/LEFT HEART CATH AND CORONARY/GRAFT ANGIOGRAPHY
Anesthesia: Moderate Sedation

## 2020-11-15 MED ORDER — VERAPAMIL HCL 2.5 MG/ML IV SOLN
INTRAVENOUS | Status: AC
  Filled 2020-11-15: qty 2

## 2020-11-15 MED ORDER — ASPIRIN 81 MG PO CHEW
CHEWABLE_TABLET | ORAL | Status: AC
  Filled 2020-11-15: qty 1

## 2020-11-15 MED ORDER — TICAGRELOR 90 MG PO TABS
ORAL_TABLET | ORAL | Status: DC | PRN
  Administered 2020-11-15: 90 mg via ORAL

## 2020-11-15 MED ORDER — SODIUM CHLORIDE 0.9% FLUSH
3.0000 mL | Freq: Two times a day (BID) | INTRAVENOUS | Status: DC
  Administered 2020-11-15: 3 mL via INTRAVENOUS

## 2020-11-15 MED ORDER — IOHEXOL 300 MG/ML  SOLN
INTRAMUSCULAR | Status: DC | PRN
  Administered 2020-11-15: 121 mL

## 2020-11-15 MED ORDER — SODIUM CHLORIDE 0.9 % IV SOLN: INTRAVENOUS | Status: DC

## 2020-11-15 MED ORDER — SODIUM CHLORIDE 0.9% FLUSH
3.0000 mL | Freq: Two times a day (BID) | INTRAVENOUS | Status: DC
  Administered 2020-11-15 – 2020-11-16 (×2): 3 mL via INTRAVENOUS

## 2020-11-15 MED ORDER — TICAGRELOR 90 MG PO TABS
ORAL_TABLET | ORAL | Status: AC
  Filled 2020-11-15: qty 1

## 2020-11-15 MED ORDER — ENOXAPARIN SODIUM 40 MG/0.4ML IJ SOSY
40.0000 mg | PREFILLED_SYRINGE | INTRAMUSCULAR | Status: DC
  Administered 2020-11-16: 40 mg via SUBCUTANEOUS
  Filled 2020-11-15: qty 0.4

## 2020-11-15 MED ORDER — FENTANYL CITRATE (PF) 100 MCG/2ML IJ SOLN
INTRAMUSCULAR | Status: AC
  Filled 2020-11-15: qty 2

## 2020-11-15 MED ORDER — LABETALOL HCL 5 MG/ML IV SOLN: 10.0000 mg | INTRAVENOUS | Status: AC | PRN

## 2020-11-15 MED ORDER — HYDRALAZINE HCL 20 MG/ML IJ SOLN: 10.0000 mg | INTRAMUSCULAR | Status: AC | PRN

## 2020-11-15 MED ORDER — LIDOCAINE HCL (PF) 1 % IJ SOLN
INTRAMUSCULAR | Status: DC | PRN
  Administered 2020-11-15: 5 mL

## 2020-11-15 MED ORDER — HEPARIN SODIUM (PORCINE) 1000 UNIT/ML IJ SOLN
INTRAMUSCULAR | Status: AC
  Filled 2020-11-15: qty 1

## 2020-11-15 MED ORDER — VERAPAMIL HCL 2.5 MG/ML IV SOLN
INTRAVENOUS | Status: DC | PRN
  Administered 2020-11-15: 2.5 mg via INTRAVENOUS

## 2020-11-15 MED ORDER — HEPARIN (PORCINE) IN NACL 1000-0.9 UT/500ML-% IV SOLN
INTRAVENOUS | Status: AC
  Filled 2020-11-15: qty 1000

## 2020-11-15 MED ORDER — MIDAZOLAM HCL 2 MG/2ML IJ SOLN
INTRAMUSCULAR | Status: DC | PRN
  Administered 2020-11-15 (×2): 1 mg via INTRAVENOUS

## 2020-11-15 MED ORDER — HEPARIN SODIUM (PORCINE) 1000 UNIT/ML IJ SOLN
INTRAMUSCULAR | Status: DC | PRN
  Administered 2020-11-15: 4500 [IU] via INTRAVENOUS
  Administered 2020-11-15 (×2): 2000 [IU] via INTRAVENOUS
  Administered 2020-11-15: 4500 [IU] via INTRAVENOUS

## 2020-11-15 MED ORDER — HEPARIN (PORCINE) IN NACL 2000-0.9 UNIT/L-% IV SOLN
INTRAVENOUS | Status: DC | PRN
  Administered 2020-11-15: 1000 mL

## 2020-11-15 MED ORDER — HEPARIN BOLUS VIA INFUSION
2500.0000 [IU] | Freq: Once | INTRAVENOUS | Status: AC
  Administered 2020-11-15: 2500 [IU] via INTRAVENOUS
  Filled 2020-11-15: qty 2500

## 2020-11-15 MED ORDER — FENTANYL CITRATE (PF) 100 MCG/2ML IJ SOLN
INTRAMUSCULAR | Status: DC | PRN
  Administered 2020-11-15 (×2): 25 ug via INTRAVENOUS

## 2020-11-15 MED ORDER — SODIUM CHLORIDE 0.9 % IV SOLN: 250.0000 mL | INTRAVENOUS | Status: DC | PRN

## 2020-11-15 MED ORDER — PERFLUTREN LIPID MICROSPHERE
1.0000 mL | INTRAVENOUS | Status: AC | PRN
  Administered 2020-11-15: 3 mL via INTRAVENOUS
  Filled 2020-11-15: qty 10

## 2020-11-15 MED ORDER — SODIUM CHLORIDE 0.9% FLUSH: 3.0000 mL | INTRAVENOUS | Status: DC | PRN

## 2020-11-15 MED ORDER — SODIUM CHLORIDE 0.9 % IV SOLN: INTRAVENOUS | Status: AC

## 2020-11-15 MED ORDER — VERAPAMIL HCL 2.5 MG/ML IV SOLN
INTRAVENOUS | Status: DC | PRN
  Administered 2020-11-15 (×2): 500 ug via INTRACORONARY

## 2020-11-15 MED ORDER — NITROGLYCERIN 1 MG/10 ML FOR IR/CATH LAB
INTRA_ARTERIAL | Status: DC | PRN
  Administered 2020-11-15: 200 ug

## 2020-11-15 MED ORDER — LIDOCAINE HCL (PF) 1 % IJ SOLN
INTRAMUSCULAR | Status: AC
  Filled 2020-11-15: qty 30

## 2020-11-15 MED ORDER — MIDAZOLAM HCL 2 MG/2ML IJ SOLN
INTRAMUSCULAR | Status: AC
  Filled 2020-11-15: qty 2

## 2020-11-15 MED ORDER — ASPIRIN 81 MG PO CHEW
81.0000 mg | CHEWABLE_TABLET | Freq: Once | ORAL | Status: AC
  Administered 2020-11-15: 81 mg via ORAL

## 2020-11-15 MED ORDER — NITROGLYCERIN 1 MG/10 ML FOR IR/CATH LAB
INTRA_ARTERIAL | Status: AC
  Filled 2020-11-15: qty 10

## 2020-11-15 SURGICAL SUPPLY — 23 items
BALLN TREK RX 2.5X12 (BALLOONS) ×2
BALLN ~~LOC~~ TREK RX 3.5X12 (BALLOONS) ×2
BALLOON TREK RX 2.5X12 (BALLOONS) ×1 IMPLANT
BALLOON ~~LOC~~ TREK RX 3.5X12 (BALLOONS) ×1 IMPLANT
CATH BALLN WEDGE 5F 110CM (CATHETERS) ×2 IMPLANT
CATH INFINITI 5 FR IM (CATHETERS) ×2 IMPLANT
CATH INFINITI 5 FR JL3.5 (CATHETERS) ×2 IMPLANT
CATH INFINITI JR4 5F (CATHETERS) ×2 IMPLANT
CATH VISTA GUIDE 6FR LCB (CATHETERS) ×2 IMPLANT
DEVICE RAD TR BAND REGULAR (VASCULAR PRODUCTS) ×2 IMPLANT
DRAPE BRACHIAL (DRAPES) ×4 IMPLANT
GLIDESHEATH SLEND SS 6F .021 (SHEATH) ×2 IMPLANT
GUIDEWIRE INQWIRE 1.5J.035X260 (WIRE) ×1 IMPLANT
INQWIRE 1.5J .035X260CM (WIRE) ×2
KIT ENCORE 26 ADVANTAGE (KITS) ×2 IMPLANT
PACK CARDIAC CATH (CUSTOM PROCEDURE TRAY) ×2 IMPLANT
PROTECTION STATION PRESSURIZED (MISCELLANEOUS) ×2
SET ATX SIMPLICITY (MISCELLANEOUS) ×2 IMPLANT
SHEATH GLIDE SLENDER 4/5FR (SHEATH) ×2 IMPLANT
STATION PROTECTION PRESSURIZED (MISCELLANEOUS) ×1 IMPLANT
STENT RESOLUTE ONYX 3.0X12 (Permanent Stent) ×2 IMPLANT
STENT RESOLUTE ONYX 3.0X15 (Permanent Stent) ×2 IMPLANT
WIRE RUNTHROUGH .014X180CM (WIRE) ×2 IMPLANT

## 2020-11-15 NOTE — Progress Notes (Signed)
Pt arrived to the floor via bed, alert and oriented x 4. No bleeding, pain or swelling at procedure site. Pt oriented to room, call light within reach and bed in lowest position. Will continue to monitor.

## 2020-11-15 NOTE — ED Notes (Signed)
Vs assessed. No other needs found at this moment.

## 2020-11-15 NOTE — Interval H&P Note (Signed)
History and Physical Interval Note:  11/15/2020 9:52 AM  Willie Clark  has presented today for surgery, with the diagnosis of unstable angina.  The various methods of treatment have been discussed with the patient and family. After consideration of risks, benefits and other options for treatment, the patient has consented to  Procedure(s): LEFT HEART CATH AND CORS/GRAFTS ANGIOGRAPHY (N/A) RIGHT/LEFT HEART CATH AND CORONARY/GRAFT ANGIOGRAPHY (N/A) as a surgical intervention.  The patient's history has been reviewed, patient examined, no change in status, stable for surgery.  I have reviewed the patient's chart and labs.  Questions were answered to the patient's satisfaction.    Cath Lab Visit (complete for each Cath Lab visit)  Clinical Evaluation Leading to the Procedure:   ACS: Yes.    Non-ACS:  N/A  Lariah Fleer

## 2020-11-15 NOTE — Brief Op Note (Signed)
BRIEF CARDIAC CATHETERIZATION NOTE  11/15/2020  11:40 AM  PATIENT:  Willie Clark  70 y.o. male  PRE-OPERATIVE DIAGNOSIS:  unstable angina  POST-OPERATIVE DIAGNOSIS:  Same  PROCEDURE:  Procedure(s): RIGHT/LEFT HEART CATH AND CORONARY/GRAFT ANGIOGRAPHY (N/A) CORONARY STENT INTERVENTION (N/A)  SURGEON:  Surgeon(s) and Role:    * Shada Nienaber, MD - Primary  FINDINGS: Severe native CAD, including CTO's of ostial LAD and proximal RCA. Patent LCx stent with moderate ISR, similar to 06/2020. Widely patent LIMA-LAD. Patent SVG-OM with 75% ostial and 60% mid graft stenoses. Patent SVG-rPDA with ectasia.  Proximal stents are patent with ~10% ISR.  Distal anastomotic stent is widely patent. Upper normal left and right heart filling pressures. Successful PCI to ostial and mid SVT-OM using non-overlapping Resolute Onyx 3.0 x 12 mm and 3.0 x 15 mm DES with 0% residual stenosis and TIMI-3 flow.  RECOMMENDATIONS: Overnight recovery. Continue DAPT with ASA and ticagrelor for at least 12 months. Aggressive secondary prevention. Obtain echo.  Nelva Bush, MD [facility]

## 2020-11-15 NOTE — Consult Note (Signed)
[place] for heparin  Indication: chest pain/ACS  Allergies  Allergen Reactions   Amitriptyline Other (See Comments)    Pt unsure of what happens Other reaction(s): Other (See Comments) Pt unsure of what happens Pt unsure of what happens   Cyclobenzaprine Other (See Comments) and Hives    Pt unsure what hapens Pt unsure what hapens Pt unsure what hapens    Dicyclomine Shortness Of Breath and Swelling   Sacubitril-Valsartan Anaphylaxis and Itching   Amitriptyline Hcl    Berniece Salines Flavor Diarrhea   Hydrocodone    Fish Oil Itching   Lidocaine Nausea And Vomiting, Other (See Comments) and Nausea Only    Nasal Spray (only time had reaction) Other reaction(s): Dizziness Nasal Spray (only time had reaction)   Omega-3 Fatty Acids-Vitamin E Itching   Other Diarrhea and Itching    Reactive agents: Onions, bacon, steak   Oxycodone-Acetaminophen Other (See Comments)    Lowers BP Other reaction(s): Other (See Comments) Lowers BP Lowers BP   Sulfa Antibiotics Itching    Patient Measurements: Height: '5\' 8"'$  (172.7 cm) Weight: 90.7 kg (200 lb) IBW/kg (Calculated) : 68.4 Heparin Dosing Weight: 87.1 kg  Vital Signs: Temp: 98 F (36.7 C) (07/26 1701) Temp Source: Oral (07/26 1701) BP: 101/66 (07/27 0230) Pulse Rate: 59 (07/27 0230)  Labs: Recent Labs    11/14/20 1710 11/14/20 2042 11/15/20 0418  HGB 14.8  --   --   HCT 43.3  --   --   PLT 195  --   --   APTT 28  --   --   LABPROT 12.6  --   --   INR 0.9  --   --   HEPARINUNFRC  --   --  0.21*  CREATININE 1.07  --   --   TROPONINIHS 8 8  --      Estimated Creatinine Clearance: 71.2 mL/min (by C-G formula based on SCr of 1.07 mg/dL).   Medical History: Past Medical History:  Diagnosis Date   Anxiety    Arthritis    Barrett's esophagus    Bowel obstruction ([place]) 02/2009   small bowel   Chronic chest pain    Coronary artery disease    a. 1993 CABG; b. 2007 Redo CABG; c.  09/2015 Cath: LM mild dzs, LAD 100ost, RI mild dzs, LCX 60p, OM1 100, RCA 100ost, VG->OM2 min irregs, LIMA->LAD nl, VG->RPDA 30ost, 20d, nl EF->Med rx.   Depression    Diverticulitis    Diverticulosis    Erectile dysfunction    Fibromyalgia    Gastric ulcer    Gastritis    GERD (gastroesophageal reflux disease)    Headache    Hemorrhoids    HFrEF (heart failure with reduced ejection fraction) ([place])    a. 09/2016 Echo: EF 55-60%, no rwma, triv AI, mild MR, mildly dil LA. -> as of April 2020: EF 40 to 45%.  Septal dyssynergy/hypokinesis due to postop state but otherwise unable to assess wall motion [name] due to poor study.   History of myocardial infarct at age less than 46 years    Hyperlipidemia    Hypertension    Kidney stones    going to [facility]  June 26 to see kidney specialist   Lightheadedness    Mass on back    Nephrolithiasis    Orthostatic hypotension    a. Improved after discontinuation of metoprolol.   OSA (obstructive sleep apnea)    a. On CPAP.  Pulmonary embolism ([place]) 01/2006   Post-op/treated   Skin lesions, generalized    Stroke [facility]) 1993   right side weakness no blood thinners   on Aspirin   Syncope and collapse    Urticaria     Medications:  Brilinta 90 mg BID and ASA 81 mg daily   Assessment: 70 y.o. male referred to ED from cardiology office for complaints of chest pain and shortness of breath. Pharmacy has been consulted for heparin dosing for ACS.  Heparin Dosing Weight: 87.1 kg H/H and plts WNL  7/27 0418 HL 0.21 - subtherapeutic @ 1100 units/hr  Baseline labs:  aPTT: 28 INR: 0.9  Goal of Therapy:  Heparin level 0.3-0.7 units/ml Monitor platelets by anticoagulation protocol: Yes   Plan:  Give additional 2500 units bolus x 1 Increase heparin infusion to 1350 units/hr Check anti-Xa level in 6 hours and daily while on heparin Continue to monitor H&H and platelets  Lu Duffel, PharmD, BCPS Clinical Pharmacist 11/15/2020  4:51 AM

## 2020-11-15 NOTE — H&P (View-Only) (Signed)
Cardiology Consultation:   Patient ID: Willie Clark; [id number]; February 09, 1951   Admit date: 11/14/2020 Date of Consult: 11/15/2020  Primary Care Provider: [name], MD (Inactive) Primary Cardiologist: Willie Clark Primary Electrophysiologist:  None   Patient Profile:   Willie Clark is a 70 y.o. male with a hx of CAD s/p CABG in 1993 with redo in 2007, HFrEF secondary to ICM, fibromyalgia, chronic pain, gastric ulcer, HTN, HLD, OSA on CPAP, postoperative PE s/p therapy, CVA, SBO, Barrett's esophagus, diverticulitis, hemorrhoids, nephrolithiasis, orthostatic hypotension, and cervical spine pain s/p prior surgeries who is being seen today for the evaluation of unstable angina at the request of Willie Clark.  History of Present Illness:   Willie Clark was admitted to the hospital 11/2019 with angina.  Echo showed an EF of 40 to 45%.  LHC showed severe three-vessel CAD with patent grafts and no significant change with comparison to cath in 2017.  He was medically managed and GDMT was optimized.  However, he had an allergic reaction to [facility] with rash and pruritus that required treatment in the ED.  He was seen in the office in 06/2020 with unstable angina.  EKG was worrisome for inferior ST segment elevation MI.  He underwent emergent LHC that showed severe native three-vessel CAD with patent LCx stent with mild to moderate ISR, widely patent LIMA to LAD, patent SVG to OM 2 with 60 to 70% proximal graft stenosis and subtotally occluded SVG to RCA with a long segment of proximal disease and a 99% stenosis of the distal anastomosis.  He underwent successful PCI to the SVG to RCA with overlapping DES.  Echo during that admission showed an EF of 35 to 40%.  He was seen on 11/06/2020 with worsening exertional angina and dyspnea.  Imdur was titrated to 60 mg.  He was most recently seen on 11/14/2020 noting a several week history of exertional dyspnea, angina, abdominal bloating, orthopnea, and lower extremity  swelling.  Initially, symptoms were felt to be related to volume overload with initial plan to escalate diuresis.  However, prior to the patient being discharged from the office he reported 7 out of 10 chest pain.  Repeat EKG showed no ischemic changes.  He was given SL NTG x1 in the office with improvement in his angina to 2 out of 10.  He was transferred to the ED.  High-sensitivity troponin negative x2.  BNP 20.  EKG showed NSR, 67 bpm, possible prior anterior infarct, inferolateral T wave inversion which were new when compared to prior tracing.  Chest x-ray showed no evidence of cardiopulmonary disease.  In the ED he was started on a heparin drip.  He was admitted to the hospitalist service with plans to undergo [facility] today.  Currently, he is without angina.  He has not missed any of his medications, including DAPT.   Past Medical History:  Diagnosis Date   Anxiety    Arthritis    Barrett's esophagus    Bowel obstruction ([place]) 02/2009   small bowel   Chronic chest pain    Coronary artery disease    a. 1993 CABG; b. 2007 Redo CABG; c. 09/2015 Cath: LM mild dzs, LAD 100ost, RI mild dzs, LCX 60p, OM1 100, RCA 100ost, VG->OM2 min irregs, LIMA->LAD nl, VG->RPDA 30ost, 20d, nl EF->Med rx.   Depression    Diverticulitis    Diverticulosis    Erectile dysfunction    Fibromyalgia    Gastric ulcer    Gastritis  GERD (gastroesophageal reflux disease)    Headache    Hemorrhoids    HFrEF (heart failure with reduced ejection fraction) ([place])    a. 09/2016 Echo: EF 55-60%, no rwma, triv AI, mild MR, mildly dil LA. -> as of April 2020: EF 40 to 45%.  Septal dyssynergy/hypokinesis due to postop state but otherwise unable to assess wall motion [name] due to poor study.   History of myocardial infarct at age less than 32 years    Hyperlipidemia    Hypertension    Kidney stones    going to [facility]  June 26 to see kidney specialist   Lightheadedness    Mass on back    Nephrolithiasis     Orthostatic hypotension    a. Improved after discontinuation of metoprolol.   OSA (obstructive sleep apnea)    a. On CPAP.   Pulmonary embolism ([place]) 01/2006   Post-op/treated   Skin lesions, generalized    Stroke [facility]) 1993   right side weakness no blood thinners   on Aspirin   Syncope and collapse    Urticaria     Past Surgical History:  Procedure Laterality Date   BACK SURGERY     bowel obstruction  01/2009   CARDIAC CATHETERIZATION  11/2006   patent grafts. Significant OM3 disease and occluded diagonals.   CARDIAC CATHETERIZATION  06/2010   patent grafts. significant ISR in proximal LCX but OM2 is bypassed and gives retrograde flow to OM3.    CARDIAC CATHETERIZATION  06/2013   [facility]: Patent grafts with 60% proximal in-stent restenosis in the left circumflex with FFR of 0.85   CARDIAC CATHETERIZATION Left 09/25/2015   Procedure: Left Heart Cath and Cors/Grafts Angiography;  Surgeon: Willie Hampshire, MD;  Location: [place] CV LAB;  Service: Cardiovascular;  Laterality: Left;   CERVICAL FUSION     CHOLECYSTECTOMY     COLON SURGERY  01/2009   BOWEL RESECTION DUE TO SMALL BOWEL OBSTRUCTION   COLONOSCOPY     COLONOSCOPY WITH PROPOFOL N/A 10/28/2017   Procedure: COLONOSCOPY WITH PROPOFOL;  Surgeon: Willie Sails, MD;  Location: [facility] ENDOSCOPY;  Service: Endoscopy;  Laterality: N/A;   CORONARY ANGIOPLASTY  2006   CORONARY ARTERY BYPASS GRAFT  1993/01/2006   redo at [facility]. LIMA to LAD, SVG to OM2 and SVG to RPDA   CORONARY STENT INTERVENTION N/A 06/20/2020   Procedure: CORONARY STENT INTERVENTION;  Surgeon: Willie Bush, MD;  Location: [place] CV LAB;  Service: Cardiovascular;  Laterality: N/A;   CYSTOSCOPY WITH STENT PLACEMENT Bilateral 09/24/2015   Procedure: CYSTOSCOPY WITH BILATERAL RETROGRADES, BILATERAL STENT PLACEMENT;  Surgeon: Willie Aloe, MD;  Location: [facility] ORS;  Service: Urology;  Laterality: Bilateral;   ESOPHAGOGASTRODUODENOSCOPY N/A 10/25/2014    Procedure: ESOPHAGOGASTRODUODENOSCOPY (EGD);  Surgeon: Willie Sails, MD;  Location: [facility] ENDOSCOPY;  Service: Endoscopy;  Laterality: N/A;   ESOPHAGOGASTRODUODENOSCOPY (EGD) WITH PROPOFOL N/A 02/03/2017   Procedure: ESOPHAGOGASTRODUODENOSCOPY (EGD) WITH PROPOFOL;  Surgeon: Willie Sails, MD;  Location: [facility] ENDOSCOPY;  Service: Endoscopy;  Laterality: N/A;   ESOPHAGOGASTRODUODENOSCOPY (EGD) WITH PROPOFOL N/A 04/18/2017   Procedure: ESOPHAGOGASTRODUODENOSCOPY (EGD) WITH PROPOFOL;  Surgeon: Willie Sails, MD;  Location: [facility] ENDOSCOPY;  Service: Endoscopy;  Laterality: N/A;   EYE SURGERY Bilateral    Cataract Extraction with IOL   HERNIA REPAIR     INGUINAL HERNIA REPAIR Bilateral 01/23/2016   Procedure: HERNIA REPAIR INGUINAL ADULT BILATERAL;  Surgeon: Leonie Green, MD;  Location: [facility] ORS;  Service: General;  Laterality: Bilateral;  LEFT HEART CATH AND CORS/GRAFTS ANGIOGRAPHY N/A 12/13/2019   Procedure: LEFT HEART CATH AND CORS/GRAFTS ANGIOGRAPHY;  Surgeon: Willie Hampshire, MD;  Location: [place] CV LAB;  Service: Cardiovascular;  Laterality: N/A;   LEFT HEART CATH AND CORS/GRAFTS ANGIOGRAPHY N/A 06/20/2020   Procedure: LEFT HEART CATH AND CORS/GRAFTS ANGIOGRAPHY;  Surgeon: Willie Bush, MD;  Location: [place] CV LAB;  Service: Cardiovascular;  Laterality: N/A;   LUMBAR LAMINECTOMY/DECOMPRESSION MICRODISCECTOMY Left 10/01/2016   Procedure: Left Lumbar four-five Laminotomy for resection of synovial cyst;  Surgeon: Erline Levine, MD;  Location: [place];  Service: Neurosurgery;  Laterality: Left;  left   NECK SURGERY  06/2009   SHOULDER SURGERY  2010     Home Meds: Prior to Admission medications   Medication Sig Start Date End Date Taking? Authorizing Provider  albuterol (PROVENTIL HFA;VENTOLIN HFA) 108 (90 Base) MCG/ACT inhaler Inhale into the lungs every 4 (four) hours as needed for wheezing or shortness of breath.   Yes [provider]  aspirin EC 81  MG EC tablet Take 1 tablet (81 mg total) by mouth daily. Swallow whole. 06/23/20  Yes Nolberto Hanlon, MD  carvedilol (COREG) 3.125 MG tablet Take 1 tablet (3.125 mg total) by mouth 2 (two) times daily with a meal. 07/03/20 11/15/20 Yes Willie Hampshire, MD  citalopram (CELEXA) 20 MG tablet Take 20 mg by mouth daily.    Yes [provider]  dapagliflozin propanediol (FARXIGA) 10 MG TABS tablet Take 1 tablet (10 mg total) by mouth daily before breakfast. 11/02/20  Yes Willie Hampshire, MD  EPINEPHrine 0.3 mg/0.3 mL IJ SOAJ injection Inject 0.3 mg into the muscle as needed.  12/27/19  Yes [provider]  fexofenadine (ALLEGRA) 180 MG tablet Take 180 mg by mouth daily.   Yes [provider]  finasteride (PROSCAR) 5 MG tablet Take 5 mg by mouth daily.   Yes [provider]  furosemide (LASIX) 40 MG tablet TAKE 1 TABLET BY MOUTH EVERY DAY 10/26/20  Yes Willie Hampshire, MD  gabapentin (NEURONTIN) 800 MG tablet Take 1 tablet by mouth 3 (three) times daily. 12/22/19  Yes [provider]  isosorbide mononitrate (IMDUR) 60 MG 24 hr tablet Take 1 tablet (60 mg total) by mouth daily. 11/06/20  Yes Furth, Cadence H, PA-C  losartan (COZAAR) 25 MG tablet TAKE 1 TABLET EVERY DAY 10/05/20  Yes Willie Hampshire, MD  Mesalamine 800 MG TBEC Take 1,600 mg by mouth in the morning, at noon, and at bedtime. 10/25/20 11/24/20 Yes [provider]  Multiple Vitamins-Minerals (HAIR SKIN AND NAILS FORMULA PO) Take 1 capsule by mouth daily.   Yes [provider]  nitroGLYCERIN (NITROSTAT) 0.4 MG SL tablet Place 1 tablet (0.4 mg total) under the tongue every 5 (five) minutes as needed for chest pain. 06/22/20 11/15/20 Yes Nolberto Hanlon, MD  ondansetron (ZOFRAN) 4 MG tablet Take 4 mg by mouth every 8 (eight) hours as needed for nausea or vomiting.   Yes [provider]  pantoprazole (PROTONIX) 40 MG tablet Take 40 mg by mouth 2 (two) times daily.    Yes [provider]   Peppermint Oil (IBGARD PO) Take by mouth daily at 2 am.   Yes [provider]  potassium chloride (KLOR-CON) 10 MEQ tablet Take 1 tablet (10 mEq total) by mouth daily. 08/07/20 11/15/20 Yes Furth, Cadence H, PA-C  pregabalin (LYRICA) 100 MG capsule Take 100 mg by mouth 3 (three) times daily. 08/07/20  Yes [provider]  Probiotic Product (PROBIOTIC PO) Take by mouth daily at 8 pm.   Yes [provider]  rOPINIRole (REQUIP) 1 MG tablet Take 1 mg by mouth 2 (two) times daily. 05/18/20  Yes [provider]  rosuvastatin (CRESTOR) 40 MG tablet Take 1 tablet (40 mg total) by mouth daily. 05/19/20  Yes Willie Hampshire, MD  spironolactone (ALDACTONE) 25 MG tablet Take 1 tablet (25 mg total) by mouth daily. 08/25/20 11/23/20 Yes Furth, Cadence H, PA-C  ticagrelor (BRILINTA) 90 MG TABS tablet Take 90 mg by mouth 2 (two) times daily.   Yes [provider]  traMADol (ULTRAM) 50 MG tablet Take 50 mg by mouth every 6 (six) hours as needed. 11/07/20  Yes [provider]  VASCEPA 1 g capsule TAKE 2 CAPSULES TWICE DAILY 07/12/20  Yes Willie Hampshire, MD  vitamin B-12 (CYANOCOBALAMIN) 1000 MCG tablet Take 1,000 mcg by mouth daily.   Yes [provider]  dicyclomine (BENTYL) 10 MG capsule Take 10 mg by mouth 3 (three) times daily. Patient not taking: No sig reported 09/29/20   [provider]  Erenumab-aooe 140 MG/ML SOAJ Inject 140 mg into the skin every 28 (twenty-eight) days. Patient not taking: Reported on 11/15/2020    [provider]  potassium chloride SA (KLOR-CON) 20 MEQ tablet Take 20 mEq by mouth daily.    [provider]    Inpatient Medications: Scheduled Meds:  aspirin EC  81 mg Oral Daily   carvedilol  3.125 mg Oral BID WC   citalopram  20 mg Oral Daily   finasteride  5 mg Oral Daily   gabapentin  800 mg Oral TID   isosorbide mononitrate  60 mg Oral Daily   losartan  25 mg Oral Daily   pantoprazole  40 mg  Oral BID   rosuvastatin  40 mg Oral QHS   sodium chloride flush  3 mL Intravenous Q12H   spironolactone  25 mg Oral Daily   ticagrelor  90 mg Oral BID   vitamin B-12  1,000 mcg Oral Daily   Continuous Infusions:  sodium chloride     heparin 1,350 Units/hr (11/15/20 0506)   PRN Meds: sodium chloride, acetaminophen, albuterol, nitroGLYCERIN, ondansetron (ZOFRAN) IV, ondansetron, sodium chloride flush  Allergies:   Allergies  Allergen Reactions   Amitriptyline Other (See Comments)    Pt unsure of what happens Other reaction(s): Other (See Comments) Pt unsure of what happens Pt unsure of what happens   Cyclobenzaprine Other (See Comments) and Hives    Pt unsure what hapens Pt unsure what hapens Pt unsure what hapens    Dicyclomine Shortness Of Breath and Swelling   Sacubitril-Valsartan Anaphylaxis and Itching   Amitriptyline Hcl    Berniece Salines Flavor Diarrhea   Hydrocodone    Fish Oil Itching   Lidocaine Nausea And Vomiting, Other (See Comments) and Nausea Only    Nasal Spray (only time had reaction) Other reaction(s): Dizziness Nasal Spray (only time had reaction)   Omega-3 Fatty Acids-Vitamin E Itching   Other Diarrhea and Itching    Reactive agents: Onions, bacon, steak   Oxycodone-Acetaminophen Other (See Comments)    Lowers BP Other reaction(s): Other (See Comments) Lowers BP Lowers BP   Sulfa Antibiotics Itching    Social History:   Social History   Socioeconomic History   Marital status: Married    Spouse name: Not on file   Number of children: Not on file   Years of education: Not on file  Highest education level: Not on file  Occupational History   Not on file  Tobacco Use   Smoking status: Former    Packs/day: 2.50    Types: Cigarettes    Quit date: 08/21/1991    Years since quitting: 29.2   Smokeless tobacco: Never  Vaping Use   Vaping Use: Never used  Substance and Sexual Activity   Alcohol use: Not Currently   Drug use: No   Sexual activity:  Not on file  Other Topics Concern   Not on file  Social History Narrative   Not on file   Social Determinants of Health   Financial Resource Strain: Not on file  Food Insecurity: Not on file  Transportation Needs: Not on file  Physical Activity: Not on file  Stress: Not on file  Social Connections: Not on file  Intimate Partner Violence: Not on file     Family History:   Family History  Problem Relation Age of Onset   Heart disease Father 54   Heart attack Father     ROS:  Review of Systems  Constitutional:  Positive for malaise/fatigue. Negative for chills, diaphoresis, fever and weight loss.  HENT:  Negative for congestion.   Eyes:  Negative for discharge and redness.  Respiratory:  Positive for shortness of breath. Negative for cough, sputum production and wheezing.   Cardiovascular:  Positive for chest pain and leg swelling. Negative for palpitations, orthopnea, claudication and PND.  Gastrointestinal:  Negative for abdominal pain, blood in stool, heartburn, melena, nausea and vomiting.  Musculoskeletal:  Negative for falls and myalgias.  Skin:  Negative for rash.  Neurological:  Positive for weakness. Negative for dizziness, tingling, tremors, sensory change, speech change, focal weakness and loss of consciousness.  Endo/Heme/Allergies:  Does not bruise/bleed easily.  Psychiatric/Behavioral:  Negative for substance abuse. The patient is not nervous/anxious.   All other systems reviewed and are negative.    Physical Exam/Data:   Vitals:   11/15/20 0230 11/15/20 0430 11/15/20 0500 11/15/20 0508  BP: 101/66 122/72 115/62   Pulse: (!) 59 61 (!) 58   Resp: '11 14 12   '$ Temp:    98 F (36.7 C)  TempSrc:    Oral  SpO2: 93% 94% 96%   Weight:      Height:       No intake or output data in the 24 hours ending 11/15/20 0715 Filed Weights   11/14/20 1659  Weight: 90.7 kg   Body mass index is 30.41 kg/m.   Physical Exam: General: Well developed, well nourished, in  no acute distress. Head: Normocephalic, atraumatic, sclera non-icteric, no xanthomas, nares without discharge.  Neck: Negative for carotid bruits. JVD not elevated. Lungs: Clear bilaterally to auscultation without wheezes, rales, or rhonchi. Breathing is unlabored. Heart: RRR with S1 S2. No murmurs, rubs, or gallops appreciated. Abdomen: Soft, non-tender, non-distended with normoactive bowel sounds. No hepatomegaly. No rebound/guarding. No obvious abdominal masses. Msk:  Strength and tone appear normal for age. Extremities: No clubbing or cyanosis. No edema. Distal pedal pulses are 2+ and equal bilaterally. Neuro: Alert and oriented X 3. No facial asymmetry. No focal deficit. Moves all extremities spontaneously. Psych:  Responds to questions appropriately with a normal affect.   EKG:  The EKG was personally reviewed and demonstrates: NSR, 67 bpm, possible prior anterior infarct, inferolateral T wave inversion  Telemetry:  Telemetry was personally reviewed and demonstrates: SR with PVCs  Weights: Filed Weights   11/14/20 1659  Weight: 90.7 kg  Relevant CV Studies:  2D echo 06/2020:  1. Left ventricular ejection fraction, by estimation, is 35 to 40%. The  left ventricle has moderately decreased function. The left ventricle  demonstrates regional wall motion abnormalities (see scoring  diagram/findings for description). Left ventricular   diastolic parameters are consistent with Grade I diastolic dysfunction  (impaired relaxation). There is severe hypokinesis of the left  ventricular, entire inferolateral wall.   2. Right ventricular systolic function is low normal. The right  ventricular size is normal.   3. The mitral valve is normal in structure. No evidence of mitral valve  regurgitation.   4. The aortic valve is tricuspid. Aortic valve regurgitation is mild.   5. The inferior vena cava is normal in size with greater than 50%  respiratory variability, suggesting right atrial  pressure of 3 mmHg. __________  LHC 06/2020: Conclusions: Severe native CAD, including chronic total occlusions of the ostial LAD and RCA. Patent LCx stent with mild to moderate in-stent restenosis. Widely patent LIMA-LAD. Patent SVG-OM2 with 60-70% ostial/proximal graft stenosis. Subtotally occluded SVG-RCA with long segment of proximal graft disease of up to 80% and 99% stenosis at the distal anastomosis with TIMI-1 flow. Moderately reduced left ventricular contraction with inferior akinesis.  Normal left ventricular filling pressure. Successful PCI to SVG-RCA with overlapping Resolute Onyx 4.0 x 26 mm and 4.0 x 38 mm proximal and 2.5 x 18 mm distal (extending into rPDA) drug-eluting stents.   Recommendations: Continue tirofiban infusion x 6 hours. Dual antiplatelet therapy with aspirin and tigagrelor for at least 12 months. Aggressive secondary prevention. If patient has recurrent chest pain, consider PCI to SVG-OM2. Follow-up echocardiogram. __________  LHC 11/2019:  Ost LAD lesion is 100% stenosed. Ost 1st Mrg to 1st Mrg lesion is 100% stenosed. The left ventricular ejection fraction is 35-45% by visual estimate. There is moderate left ventricular systolic dysfunction. LV end diastolic pressure is mildly elevated. Ost RCA to Prox RCA lesion is 100% stenosed. SVG graft was visualized by angiography and is moderate in size. The graft exhibits mild . LIMA graft was visualized by angiography and is normal in caliber. The graft exhibits no disease. 2nd RPL lesion is 100% stenosed. 3rd RPL lesion is 100% stenosed. SVG graft was visualized by angiography and is normal in caliber. The graft exhibits mild . 2nd Mrg lesion is 100% stenosed. Prox Cx to Mid Cx lesion is 40% stenosed.   1.  Severe underlying three-vessel coronary artery disease with patent grafts including LIMA to LAD, SVG to OM and SVG to right PDA.  Patent stent in the native mid left circumflex with moderate in-stent  restenosis.  No significant change in coronary anatomy since most recent cardiac catheterization in 2017.  The patient does have diffuse small vessel disease. 2.  Moderately reduced LV systolic function with an EF of 35 to 40% with mildly elevated left ventricular end-diastolic pressure at 15 mmHg.   Recommendations: Continue medical therapy for coronary artery disease and chronic systolic heart failure.  The patient is not able to tolerate long-acting nitroglycerin due to headaches. For cardiomyopathy, I discontinued amlodipine and indapamide and will start him on small dose carvedilol and losartan.  We could consider switching to [facility] as an outpatient. __________  2D echo 11/2019: 1. Left ventricular ejection fraction, by estimation, is 40 to 45%. The  left ventricle has mildly decreased function. Left ventricular endocardial  border not optimally defined to evaluate regional wall motion. There is  mild left ventricular hypertrophy.   Left  ventricular diastolic parameters are consistent with Grade I  diastolic dysfunction (impaired relaxation).   2. Right ventricular systolic function is normal. The right ventricular  size is normal. There is normal pulmonary artery systolic pressure. The  estimated right ventricular systolic pressure is 0000000 mmHg.   3. Left atrial size was mildly dilated.   4. The mitral valve is normal in structure. No evidence of mitral valve  regurgitation. No evidence of mitral stenosis.   5. The aortic valve is normal in structure. Aortic valve regurgitation is  mild. Mild aortic valve sclerosis is present, with no evidence of aortic  valve stenosis.  Laboratory Data:  Chemistry Recent Labs  Lab 11/14/20 1710  NA 138  K 4.1  CL 99  CO2 29  GLUCOSE 111*  BUN 11  CREATININE 1.07  CALCIUM 9.3  GFRNONAA >60  ANIONGAP 10    Recent Labs  Lab 11/14/20 1710  PROT 6.9  ALBUMIN 4.6  AST 27  ALT 23  ALKPHOS 52  BILITOT 0.9   Hematology Recent Labs   Lab 11/14/20 1710 11/15/20 0418  WBC 7.2 5.3  RBC 4.49 4.25  HGB 14.8 13.8  HCT 43.3 41.2  MCV 96.4 96.9  MCH 33.0 32.5  MCHC 34.2 33.5  RDW 13.3 13.2  PLT 195 163   Cardiac EnzymesNo results for input(s): TROPONINI in the last 168 hours. No results for input(s): TROPIPOC in the last 168 hours.  BNP Recent Labs  Lab 11/14/20 1710  BNP 20.8    DDimer No results for input(s): DDIMER in the last 168 hours.  Radiology/Studies:  DG Chest 2 View  Result Date: 11/14/2020 IMPRESSION: No evidence of acute cardiopulmonary disease. Electronically Signed   By: Margaretha Sheffield MD   On: 11/14/2020 18:56    Assessment and Plan:   1. CAD s/p CABG with redo CABG in 2007 with unstable angina: -Currently, chest pain-free -Heparin drip -ASA/Brilinta -Imdur/carvedilol/Crestor -N.p.o. -R/LHC -Echo -Risks and benefits of cardiac catheterization have been discussed with the patient including risks of bleeding, bruising, infection, kidney damage, stroke, heart attack, and death. The patient understands these risks and is willing to proceed with the procedure. All questions have been answered and concerns listened to  2. HFrEF secondary to ICM: -He does not appear to be grossly volume overloaded -RHC later today to further assess volume status to help gauge potential diuresis -Continue current GDMT including carvedilol, losartan, and spironolactone -Intolerant to Entresto secondary to allergic reaction -Echo as above  3. HTN: -Blood pressure has been reasonably controlled in the ED, though most recent reading is elevated -Continue medications as above with recommendation to titrate as indicated post cath  4. HLD: -LDL 61 -Crestor  5. AAA: -Noted on ultrasound in 08/2020 measuring 3 cm -Repeat ultrasound in 2024   For questions or updates, please contact [place] Please consult [email or web address] for contact info under Cardiology/STEMI.   Signed, Christell Faith, PA-C [place] Pager: [phone] 11/15/2020, 7:15 AM

## 2020-11-15 NOTE — Progress Notes (Signed)
PROGRESS NOTE    Willie Clark  [id number] DOB: 1950-12-31 DOA: 11/14/2020 PCP: [name], MD (Inactive)    Assessment & Plan:   Active Problems:   Coronary artery disease involving native coronary artery of native heart with angina pectoris (HCC)   Essential hypertension   GERD (gastroesophageal reflux disease)   HFrEF (heart failure with reduced ejection fraction) (HCC)   Chest pain due to coronary artery disease (HCC)  Chest pain: w/ hx of CABG x2. Nitro, morphine prn. S/p cardiac cath shows severe native CAD w/ stents placed in ostial & mid SVT-OM as per cardio. Continue on aspirin, brilinta, imdur, aldactone,coreg, losartan and statin. IV heparin drip was d/c. Cardio recs apprec    HTN: continue on coreg, losartan, imdur   HLD: continue on statin    GERD: continue on PPI   Chronic combined CHF: echo in 3/22 showed EF [id number], grade I diastolic dysfunction & regional wall motion abnormalities. Monitor I/Os. Continue on coreg, aspirin, imdur, aldactone  Likely peripheral neuropathy: continue on home dose of gabapentin    DVT prophylaxis: lovenox  Code Status: full  Family Communication: discussed pt's care w/ pt's family at bedside and answered their questions Disposition Plan: likely d/c back home  Level of care: Progressive Cardiac  Status is: Inpatient  Remains inpatient appropriate because:IV treatments appropriate due to intensity of illness or inability to take PO and Inpatient level of care appropriate due to severity of illness  Dispo: The patient is from: Home              Anticipated d/c is to: Home              Patient currently is not medically stable to d/c.   Difficult to place patient No    Consultants:  Cardio   Procedures:   Antimicrobials:   Subjective: Pt c/o intermittent chest pain   Objective: Vitals:   11/15/20 0430 11/15/20 0500 11/15/20 0508 11/15/20 0734  BP: 122/72 115/62  139/80  Pulse: 61 (!) 58  64  Resp: '14 12   18  '$ Temp:   98 F (36.7 C) 98 F (36.7 C)  TempSrc:   Oral Oral  SpO2: 94% 96%  97%  Weight:      Height:       No intake or output data in the 24 hours ending 11/15/20 0822 Filed Weights   11/14/20 1659  Weight: 90.7 kg    Examination:  General exam: Appears calm and comfortable  Respiratory system: Clear to auscultation. Respiratory effort normal. Cardiovascular system: S1 & S2 +. No rubs, gallops or clicks. Gastrointestinal system: Abdomen is nondistended, soft and nontender. Normal bowel sounds heard. Central nervous system: Alert and oriented. Moves all extremities  Psychiatry: Judgement and insight appear normal. Mood & affect appropriate.     Data Reviewed: I have personally reviewed following labs and imaging studies  CBC: Recent Labs  Lab 11/14/20 1710 11/15/20 0418  WBC 7.2 5.3  NEUTROABS 4.1  --   HGB 14.8 13.8  HCT 43.3 41.2  MCV 96.4 96.9  PLT 195 [id number]   Basic Metabolic Panel: Recent Labs  Lab 11/14/20 1710  NA 138  K 4.1  CL 99  CO2 29  GLUCOSE 111*  BUN 11  CREATININE 1.07  CALCIUM 9.3   GFR: Estimated Creatinine Clearance: 71.2 mL/min (by C-G formula based on SCr of 1.07 mg/dL). Liver Function Tests: Recent Labs  Lab 11/14/20 1710  AST 27  ALT 23  ALKPHOS 52  BILITOT 0.9  PROT 6.9  ALBUMIN 4.6   No results for input(s): LIPASE, AMYLASE in the last 168 hours. No results for input(s): AMMONIA in the last 168 hours. Coagulation Profile: Recent Labs  Lab 11/14/20 1710  INR 0.9   Cardiac Enzymes: No results for input(s): CKTOTAL, CKMB, CKMBINDEX, TROPONINI in the last 168 hours. BNP (last 3 results) Recent Labs    08/07/20 1417  PROBNP 242   HbA1C: No results for input(s): HGBA1C in the last 72 hours. CBG: No results for input(s): GLUCAP in the last 168 hours. Lipid Profile: No results for input(s): CHOL, HDL, LDLCALC, TRIG, CHOLHDL, LDLDIRECT in the last 72 hours. Thyroid Function Tests: Recent Labs    11/14/20 2042   TSH 2.375  FREET4 1.07   Anemia Panel: No results for input(s): VITAMINB12, FOLATE, FERRITIN, TIBC, IRON, RETICCTPCT in the last 72 hours. Sepsis Labs: No results for input(s): PROCALCITON, LATICACIDVEN in the last 168 hours.  Recent Results (from the past 240 hour(s))  Resp Panel by RT-PCR (Flu A&B, Covid) Nasopharyngeal Swab     Status: None   Collection Time: 11/14/20  5:18 PM   Specimen: Nasopharyngeal Swab; Nasopharyngeal(NP) swabs in vial transport medium  Result Value Ref Range Status   SARS Coronavirus 2 by RT PCR NEGATIVE NEGATIVE Final    Comment: (NOTE) SARS-CoV-2 target nucleic acids are NOT DETECTED.  The SARS-CoV-2 RNA is generally detectable in upper respiratory specimens during the acute phase of infection. The lowest concentration of SARS-CoV-2 viral copies this assay can detect is 138 copies/mL. A negative result does not preclude SARS-Cov-2 infection and should not be used as the sole basis for treatment or other patient management decisions. A negative result may occur with  improper specimen collection/handling, submission of specimen other than nasopharyngeal swab, presence of viral mutation(s) within the areas targeted by this assay, and inadequate number of viral copies(<138 copies/mL). A negative result must be combined with clinical observations, patient history, and epidemiological information. The expected result is Negative.  Fact Sheet for Patients:  EntrepreneurPulse.com.au  Fact Sheet for Healthcare Providers:  IncredibleEmployment.be  This test is no t yet approved or cleared by the [place] FDA and  has been authorized for detection and/or diagnosis of SARS-CoV-2 by FDA under an Emergency Use Authorization (EUA). This EUA will remain  in effect (meaning this test can be used) for the duration of the COVID-19 declaration under Section 564(b)(1) of the Act, 21 U.S.C.section 360bbb-3(b)(1), unless the  authorization is terminated  or revoked sooner.       Influenza A by PCR NEGATIVE NEGATIVE Final   Influenza B by PCR NEGATIVE NEGATIVE Final    Comment: (NOTE) The Xpert Xpress SARS-CoV-2/FLU/RSV plus assay is intended as an aid in the diagnosis of influenza from Nasopharyngeal swab specimens and should not be used as a sole basis for treatment. Nasal washings and aspirates are unacceptable for Xpert Xpress SARS-CoV-2/FLU/RSV testing.  Fact Sheet for Patients: EntrepreneurPulse.com.au  Fact Sheet for Healthcare Providers: IncredibleEmployment.be  This test is not yet approved or cleared by the [place] FDA and has been authorized for detection and/or diagnosis of SARS-CoV-2 by FDA under an Emergency Use Authorization (EUA). This EUA will remain in effect (meaning this test can be used) for the duration of the COVID-19 declaration under Section 564(b)(1) of the Act, 21 U.S.C. section 360bbb-3(b)(1), unless the authorization is terminated or revoked.  Performed at [facility], [address], [place], [place] [address]  Radiology Studies: DG Chest 2 View  Result Date: 11/14/2020 CLINICAL DATA:  Chest pain, shortness of breath. EXAM: CHEST - 2 VIEW COMPARISON:  August 11, 2020. FINDINGS: Similar cardiomediastinal silhouette. Prior CABG with median sternotomy. No consolidation. No visible pleural effusions or pneumothorax. Partially imaged cervical ACDF. IMPRESSION: No evidence of acute cardiopulmonary disease. Electronically Signed   By: Margaretha Sheffield MD   On: 11/14/2020 18:56        Scheduled Meds:  aspirin EC  81 mg Oral Daily   carvedilol  3.125 mg Oral BID WC   citalopram  20 mg Oral Daily   finasteride  5 mg Oral Daily   gabapentin  800 mg Oral TID   isosorbide mononitrate  60 mg Oral Daily   losartan  25 mg Oral Daily   pantoprazole  40 mg Oral BID   rosuvastatin  40 mg Oral QHS   sodium chloride  flush  3 mL Intravenous Q12H   sodium chloride flush  3 mL Intravenous Q12H   spironolactone  25 mg Oral Daily   ticagrelor  90 mg Oral BID   vitamin B-12  1,000 mcg Oral Daily   Continuous Infusions:  sodium chloride     sodium chloride     [START ON 11/16/2020] sodium chloride     heparin 1,350 Units/hr (11/15/20 0506)     LOS: 1 day    Time spent: 34 mins    Wyvonnia Dusky, MD [facility] Pager [phone]  If 7PM-7AM, please contact night-coverage 11/15/2020, 8:22 AM

## 2020-11-15 NOTE — Consult Note (Signed)
Cardiology Consultation:   Patient ID: Willie Clark; [id number]; 1951-01-10   Admit date: 11/14/2020 Date of Consult: 11/15/2020  Primary Care Provider: [name], MD (Inactive) Primary Cardiologist: Fletcher Anon Primary Electrophysiologist:  None   Patient Profile:   Willie Clark is a 70 y.o. male with a hx of CAD s/p CABG in 1993 with redo in 2007, HFrEF secondary to ICM, fibromyalgia, chronic pain, gastric ulcer, HTN, HLD, OSA on CPAP, postoperative PE s/p therapy, CVA, SBO, Barrett's esophagus, diverticulitis, hemorrhoids, nephrolithiasis, orthostatic hypotension, and cervical spine pain s/p prior surgeries who is being seen today for the evaluation of unstable angina at the request of Dr. Posey Pronto.  History of Present Illness:   [name] was admitted to the hospital 11/2019 with angina.  Echo showed an EF of 40 to 45%.  LHC showed severe three-vessel CAD with patent grafts and no significant change with comparison to cath in 2017.  He was medically managed and GDMT was optimized.  However, he had an allergic reaction to [facility] with rash and pruritus that required treatment in the ED.  He was seen in the office in 06/2020 with unstable angina.  EKG was worrisome for inferior ST segment elevation MI.  He underwent emergent LHC that showed severe native three-vessel CAD with patent LCx stent with mild to moderate ISR, widely patent LIMA to LAD, patent SVG to OM 2 with 60 to 70% proximal graft stenosis and subtotally occluded SVG to RCA with a long segment of proximal disease and a 99% stenosis of the distal anastomosis.  He underwent successful PCI to the SVG to RCA with overlapping DES.  Echo during that admission showed an EF of 35 to 40%.  He was seen on 11/06/2020 with worsening exertional angina and dyspnea.  Imdur was titrated to 60 mg.  He was most recently seen on 11/14/2020 noting a several week history of exertional dyspnea, angina, abdominal bloating, orthopnea, and lower extremity  swelling.  Initially, symptoms were felt to be related to volume overload with initial plan to escalate diuresis.  However, prior to the patient being discharged from the office he reported 7 out of 10 chest pain.  Repeat EKG showed no ischemic changes.  He was given SL NTG x1 in the office with improvement in his angina to 2 out of 10.  He was transferred to the ED.  High-sensitivity troponin negative x2.  BNP 20.  EKG showed NSR, 67 bpm, possible prior anterior infarct, inferolateral T wave inversion which were new when compared to prior tracing.  Chest x-ray showed no evidence of cardiopulmonary disease.  In the ED he was started on a heparin drip.  He was admitted to the hospitalist service with plans to undergo [facility] today.  Currently, he is without angina.  He has not missed any of his medications, including DAPT.   Past Medical History:  Diagnosis Date   Anxiety    Arthritis    Barrett's esophagus    Bowel obstruction ([place]) 02/2009   small bowel   Chronic chest pain    Coronary artery disease    a. 1993 CABG; b. 2007 Redo CABG; c. 09/2015 Cath: LM mild dzs, LAD 100ost, RI mild dzs, LCX 60p, OM1 100, RCA 100ost, VG->OM2 min irregs, LIMA->LAD nl, VG->RPDA 30ost, 20d, nl EF->Med rx.   Depression    Diverticulitis    Diverticulosis    Erectile dysfunction    Fibromyalgia    Gastric ulcer    Gastritis  GERD (gastroesophageal reflux disease)    Headache    Hemorrhoids    HFrEF (heart failure with reduced ejection fraction) ([place])    a. 09/2016 Echo: EF 55-60%, no rwma, triv AI, mild MR, mildly dil LA. -> as of April 2020: EF 40 to 45%.  Septal dyssynergy/hypokinesis due to postop state but otherwise unable to assess wall motion [name] due to poor study.   History of myocardial infarct at age less than 75 years    Hyperlipidemia    Hypertension    Kidney stones    going to [facility]  June 26 to see kidney specialist   Lightheadedness    Mass on back    Nephrolithiasis     Orthostatic hypotension    a. Improved after discontinuation of metoprolol.   OSA (obstructive sleep apnea)    a. On CPAP.   Pulmonary embolism ([place]) 01/2006   Post-op/treated   Skin lesions, generalized    Stroke [facility]) 1993   right side weakness no blood thinners   on Aspirin   Syncope and collapse    Urticaria     Past Surgical History:  Procedure Laterality Date   BACK SURGERY     bowel obstruction  01/2009   CARDIAC CATHETERIZATION  11/2006   patent grafts. Significant OM3 disease and occluded diagonals.   CARDIAC CATHETERIZATION  06/2010   patent grafts. significant ISR in proximal LCX but OM2 is bypassed and gives retrograde flow to OM3.    CARDIAC CATHETERIZATION  06/2013   [facility]: Patent grafts with 60% proximal in-stent restenosis in the left circumflex with FFR of 0.85   CARDIAC CATHETERIZATION Left 09/25/2015   Procedure: Left Heart Cath and Cors/Grafts Angiography;  Surgeon: Wellington Hampshire, MD;  Location: [place] CV LAB;  Service: Cardiovascular;  Laterality: Left;   CERVICAL FUSION     CHOLECYSTECTOMY     COLON SURGERY  01/2009   BOWEL RESECTION DUE TO SMALL BOWEL OBSTRUCTION   COLONOSCOPY     COLONOSCOPY WITH PROPOFOL N/A 10/28/2017   Procedure: COLONOSCOPY WITH PROPOFOL;  Surgeon: Lollie Sails, MD;  Location: [facility] ENDOSCOPY;  Service: Endoscopy;  Laterality: N/A;   CORONARY ANGIOPLASTY  2006   CORONARY ARTERY BYPASS GRAFT  1993/01/2006   redo at [facility]. LIMA to LAD, SVG to OM2 and SVG to RPDA   CORONARY STENT INTERVENTION N/A 06/20/2020   Procedure: CORONARY STENT INTERVENTION;  Surgeon: Nelva Bush, MD;  Location: [place] CV LAB;  Service: Cardiovascular;  Laterality: N/A;   CYSTOSCOPY WITH STENT PLACEMENT Bilateral 09/24/2015   Procedure: CYSTOSCOPY WITH BILATERAL RETROGRADES, BILATERAL STENT PLACEMENT;  Surgeon: Festus Aloe, MD;  Location: [facility] ORS;  Service: Urology;  Laterality: Bilateral;   ESOPHAGOGASTRODUODENOSCOPY N/A 10/25/2014    Procedure: ESOPHAGOGASTRODUODENOSCOPY (EGD);  Surgeon: Lollie Sails, MD;  Location: [facility] ENDOSCOPY;  Service: Endoscopy;  Laterality: N/A;   ESOPHAGOGASTRODUODENOSCOPY (EGD) WITH PROPOFOL N/A 02/03/2017   Procedure: ESOPHAGOGASTRODUODENOSCOPY (EGD) WITH PROPOFOL;  Surgeon: Lollie Sails, MD;  Location: [facility] ENDOSCOPY;  Service: Endoscopy;  Laterality: N/A;   ESOPHAGOGASTRODUODENOSCOPY (EGD) WITH PROPOFOL N/A 04/18/2017   Procedure: ESOPHAGOGASTRODUODENOSCOPY (EGD) WITH PROPOFOL;  Surgeon: Lollie Sails, MD;  Location: [facility] ENDOSCOPY;  Service: Endoscopy;  Laterality: N/A;   EYE SURGERY Bilateral    Cataract Extraction with IOL   HERNIA REPAIR     INGUINAL HERNIA REPAIR Bilateral 01/23/2016   Procedure: HERNIA REPAIR INGUINAL ADULT BILATERAL;  Surgeon: Leonie Green, MD;  Location: [facility] ORS;  Service: General;  Laterality: Bilateral;  LEFT HEART CATH AND CORS/GRAFTS ANGIOGRAPHY N/A 12/13/2019   Procedure: LEFT HEART CATH AND CORS/GRAFTS ANGIOGRAPHY;  Surgeon: Wellington Hampshire, MD;  Location: [place] CV LAB;  Service: Cardiovascular;  Laterality: N/A;   LEFT HEART CATH AND CORS/GRAFTS ANGIOGRAPHY N/A 06/20/2020   Procedure: LEFT HEART CATH AND CORS/GRAFTS ANGIOGRAPHY;  Surgeon: Nelva Bush, MD;  Location: [place] CV LAB;  Service: Cardiovascular;  Laterality: N/A;   LUMBAR LAMINECTOMY/DECOMPRESSION MICRODISCECTOMY Left 10/01/2016   Procedure: Left Lumbar four-five Laminotomy for resection of synovial cyst;  Surgeon: Erline Levine, MD;  Location: [place];  Service: Neurosurgery;  Laterality: Left;  left   NECK SURGERY  06/2009   SHOULDER SURGERY  2010     Home Meds: Prior to Admission medications   Medication Sig Start Date End Date Taking? Authorizing Provider  albuterol (PROVENTIL HFA;VENTOLIN HFA) 108 (90 Base) MCG/ACT inhaler Inhale into the lungs every 4 (four) hours as needed for wheezing or shortness of breath.   Yes [provider]  aspirin EC 81  MG EC tablet Take 1 tablet (81 mg total) by mouth daily. Swallow whole. 06/23/20  Yes Nolberto Hanlon, MD  carvedilol (COREG) 3.125 MG tablet Take 1 tablet (3.125 mg total) by mouth 2 (two) times daily with a meal. 07/03/20 11/15/20 Yes Wellington Hampshire, MD  citalopram (CELEXA) 20 MG tablet Take 20 mg by mouth daily.    Yes [provider]  dapagliflozin propanediol (FARXIGA) 10 MG TABS tablet Take 1 tablet (10 mg total) by mouth daily before breakfast. 11/02/20  Yes Wellington Hampshire, MD  EPINEPHrine 0.3 mg/0.3 mL IJ SOAJ injection Inject 0.3 mg into the muscle as needed.  12/27/19  Yes [provider]  fexofenadine (ALLEGRA) 180 MG tablet Take 180 mg by mouth daily.   Yes [provider]  finasteride (PROSCAR) 5 MG tablet Take 5 mg by mouth daily.   Yes [provider]  furosemide (LASIX) 40 MG tablet TAKE 1 TABLET BY MOUTH EVERY DAY 10/26/20  Yes Wellington Hampshire, MD  gabapentin (NEURONTIN) 800 MG tablet Take 1 tablet by mouth 3 (three) times daily. 12/22/19  Yes [provider]  isosorbide mononitrate (IMDUR) 60 MG 24 hr tablet Take 1 tablet (60 mg total) by mouth daily. 11/06/20  Yes Furth, Cadence H, PA-C  losartan (COZAAR) 25 MG tablet TAKE 1 TABLET EVERY DAY 10/05/20  Yes Wellington Hampshire, MD  Mesalamine 800 MG TBEC Take 1,600 mg by mouth in the morning, at noon, and at bedtime. 10/25/20 11/24/20 Yes [provider]  Multiple Vitamins-Minerals (HAIR SKIN AND NAILS FORMULA PO) Take 1 capsule by mouth daily.   Yes [provider]  nitroGLYCERIN (NITROSTAT) 0.4 MG SL tablet Place 1 tablet (0.4 mg total) under the tongue every 5 (five) minutes as needed for chest pain. 06/22/20 11/15/20 Yes Nolberto Hanlon, MD  ondansetron (ZOFRAN) 4 MG tablet Take 4 mg by mouth every 8 (eight) hours as needed for nausea or vomiting.   Yes [provider]  pantoprazole (PROTONIX) 40 MG tablet Take 40 mg by mouth 2 (two) times daily.    Yes [provider]   Peppermint Oil (IBGARD PO) Take by mouth daily at 2 am.   Yes [provider]  potassium chloride (KLOR-CON) 10 MEQ tablet Take 1 tablet (10 mEq total) by mouth daily. 08/07/20 11/15/20 Yes Furth, Cadence H, PA-C  pregabalin (LYRICA) 100 MG capsule Take 100 mg by mouth 3 (three) times daily. 08/07/20  Yes [provider]  Probiotic Product (PROBIOTIC PO) Take by mouth daily at 8 pm.   Yes [provider]  rOPINIRole (REQUIP) 1 MG tablet Take 1 mg by mouth 2 (two) times daily. 05/18/20  Yes [provider]  rosuvastatin (CRESTOR) 40 MG tablet Take 1 tablet (40 mg total) by mouth daily. 05/19/20  Yes Wellington Hampshire, MD  spironolactone (ALDACTONE) 25 MG tablet Take 1 tablet (25 mg total) by mouth daily. 08/25/20 11/23/20 Yes Furth, Cadence H, PA-C  ticagrelor (BRILINTA) 90 MG TABS tablet Take 90 mg by mouth 2 (two) times daily.   Yes [provider]  traMADol (ULTRAM) 50 MG tablet Take 50 mg by mouth every 6 (six) hours as needed. 11/07/20  Yes [provider]  VASCEPA 1 g capsule TAKE 2 CAPSULES TWICE DAILY 07/12/20  Yes Wellington Hampshire, MD  vitamin B-12 (CYANOCOBALAMIN) 1000 MCG tablet Take 1,000 mcg by mouth daily.   Yes [provider]  dicyclomine (BENTYL) 10 MG capsule Take 10 mg by mouth 3 (three) times daily. Patient not taking: No sig reported 09/29/20   [provider]  Erenumab-aooe 140 MG/ML SOAJ Inject 140 mg into the skin every 28 (twenty-eight) days. Patient not taking: Reported on 11/15/2020    [provider]  potassium chloride SA (KLOR-CON) 20 MEQ tablet Take 20 mEq by mouth daily.    [provider]    Inpatient Medications: Scheduled Meds:  aspirin EC  81 mg Oral Daily   carvedilol  3.125 mg Oral BID WC   citalopram  20 mg Oral Daily   finasteride  5 mg Oral Daily   gabapentin  800 mg Oral TID   isosorbide mononitrate  60 mg Oral Daily   losartan  25 mg Oral Daily   pantoprazole  40 mg  Oral BID   rosuvastatin  40 mg Oral QHS   sodium chloride flush  3 mL Intravenous Q12H   spironolactone  25 mg Oral Daily   ticagrelor  90 mg Oral BID   vitamin B-12  1,000 mcg Oral Daily   Continuous Infusions:  sodium chloride     heparin 1,350 Units/hr (11/15/20 0506)   PRN Meds: sodium chloride, acetaminophen, albuterol, nitroGLYCERIN, ondansetron (ZOFRAN) IV, ondansetron, sodium chloride flush  Allergies:   Allergies  Allergen Reactions   Amitriptyline Other (See Comments)    Pt unsure of what happens Other reaction(s): Other (See Comments) Pt unsure of what happens Pt unsure of what happens   Cyclobenzaprine Other (See Comments) and Hives    Pt unsure what hapens Pt unsure what hapens Pt unsure what hapens    Dicyclomine Shortness Of Breath and Swelling   Sacubitril-Valsartan Anaphylaxis and Itching   Amitriptyline Hcl    Berniece Salines Flavor Diarrhea   Hydrocodone    Fish Oil Itching   Lidocaine Nausea And Vomiting, Other (See Comments) and Nausea Only    Nasal Spray (only time had reaction) Other reaction(s): Dizziness Nasal Spray (only time had reaction)   Omega-3 Fatty Acids-Vitamin E Itching   Other Diarrhea and Itching    Reactive agents: Onions, bacon, steak   Oxycodone-Acetaminophen Other (See Comments)    Lowers BP Other reaction(s): Other (See Comments) Lowers BP Lowers BP   Sulfa Antibiotics Itching    Social History:   Social History   Socioeconomic History   Marital status: Married    Spouse name: Not on file   Number of children: Not on file   Years of education: Not on file  Highest education level: Not on file  Occupational History   Not on file  Tobacco Use   Smoking status: Former    Packs/day: 2.50    Types: Cigarettes    Quit date: 08/21/1991    Years since quitting: 29.2   Smokeless tobacco: Never  Vaping Use   Vaping Use: Never used  Substance and Sexual Activity   Alcohol use: Not Currently   Drug use: No   Sexual activity:  Not on file  Other Topics Concern   Not on file  Social History Narrative   Not on file   Social Determinants of Health   Financial Resource Strain: Not on file  Food Insecurity: Not on file  Transportation Needs: Not on file  Physical Activity: Not on file  Stress: Not on file  Social Connections: Not on file  Intimate Partner Violence: Not on file     Family History:   Family History  Problem Relation Age of Onset   Heart disease Father 53   Heart attack Father     ROS:  Review of Systems  Constitutional:  Positive for malaise/fatigue. Negative for chills, diaphoresis, fever and weight loss.  HENT:  Negative for congestion.   Eyes:  Negative for discharge and redness.  Respiratory:  Positive for shortness of breath. Negative for cough, sputum production and wheezing.   Cardiovascular:  Positive for chest pain and leg swelling. Negative for palpitations, orthopnea, claudication and PND.  Gastrointestinal:  Negative for abdominal pain, blood in stool, heartburn, melena, nausea and vomiting.  Musculoskeletal:  Negative for falls and myalgias.  Skin:  Negative for rash.  Neurological:  Positive for weakness. Negative for dizziness, tingling, tremors, sensory change, speech change, focal weakness and loss of consciousness.  Endo/Heme/Allergies:  Does not bruise/bleed easily.  Psychiatric/Behavioral:  Negative for substance abuse. The patient is not nervous/anxious.   All other systems reviewed and are negative.    Physical Exam/Data:   Vitals:   11/15/20 0230 11/15/20 0430 11/15/20 0500 11/15/20 0508  BP: 101/66 122/72 115/62   Pulse: (!) 59 61 (!) 58   Resp: '11 14 12   '$ Temp:    98 F (36.7 C)  TempSrc:    Oral  SpO2: 93% 94% 96%   Weight:      Height:       No intake or output data in the 24 hours ending 11/15/20 0715 Filed Weights   11/14/20 1659  Weight: 90.7 kg   Body mass index is 30.41 kg/m.   Physical Exam: General: Well developed, well nourished, in  no acute distress. Head: Normocephalic, atraumatic, sclera non-icteric, no xanthomas, nares without discharge.  Neck: Negative for carotid bruits. JVD not elevated. Lungs: Clear bilaterally to auscultation without wheezes, rales, or rhonchi. Breathing is unlabored. Heart: RRR with S1 S2. No murmurs, rubs, or gallops appreciated. Abdomen: Soft, non-tender, non-distended with normoactive bowel sounds. No hepatomegaly. No rebound/guarding. No obvious abdominal masses. Msk:  Strength and tone appear normal for age. Extremities: No clubbing or cyanosis. No edema. Distal pedal pulses are 2+ and equal bilaterally. Neuro: Alert and oriented X 3. No facial asymmetry. No focal deficit. Moves all extremities spontaneously. Psych:  Responds to questions appropriately with a normal affect.   EKG:  The EKG was personally reviewed and demonstrates: NSR, 67 bpm, possible prior anterior infarct, inferolateral T wave inversion  Telemetry:  Telemetry was personally reviewed and demonstrates: SR with PVCs  Weights: Filed Weights   11/14/20 1659  Weight: 90.7 kg  Relevant CV Studies:  2D echo 06/2020:  1. Left ventricular ejection fraction, by estimation, is 35 to 40%. The  left ventricle has moderately decreased function. The left ventricle  demonstrates regional wall motion abnormalities (see scoring  diagram/findings for description). Left ventricular   diastolic parameters are consistent with Grade I diastolic dysfunction  (impaired relaxation). There is severe hypokinesis of the left  ventricular, entire inferolateral wall.   2. Right ventricular systolic function is low normal. The right  ventricular size is normal.   3. The mitral valve is normal in structure. No evidence of mitral valve  regurgitation.   4. The aortic valve is tricuspid. Aortic valve regurgitation is mild.   5. The inferior vena cava is normal in size with greater than 50%  respiratory variability, suggesting right atrial  pressure of 3 mmHg. __________  LHC 06/2020: Conclusions: Severe native CAD, including chronic total occlusions of the ostial LAD and RCA. Patent LCx stent with mild to moderate in-stent restenosis. Widely patent LIMA-LAD. Patent SVG-OM2 with 60-70% ostial/proximal graft stenosis. Subtotally occluded SVG-RCA with long segment of proximal graft disease of up to 80% and 99% stenosis at the distal anastomosis with TIMI-1 flow. Moderately reduced left ventricular contraction with inferior akinesis.  Normal left ventricular filling pressure. Successful PCI to SVG-RCA with overlapping Resolute Onyx 4.0 x 26 mm and 4.0 x 38 mm proximal and 2.5 x 18 mm distal (extending into rPDA) drug-eluting stents.   Recommendations: Continue tirofiban infusion x 6 hours. Dual antiplatelet therapy with aspirin and tigagrelor for at least 12 months. Aggressive secondary prevention. If patient has recurrent chest pain, consider PCI to SVG-OM2. Follow-up echocardiogram. __________  LHC 11/2019:  Ost LAD lesion is 100% stenosed. Ost 1st Mrg to 1st Mrg lesion is 100% stenosed. The left ventricular ejection fraction is 35-45% by visual estimate. There is moderate left ventricular systolic dysfunction. LV end diastolic pressure is mildly elevated. Ost RCA to Prox RCA lesion is 100% stenosed. SVG graft was visualized by angiography and is moderate in size. The graft exhibits mild . LIMA graft was visualized by angiography and is normal in caliber. The graft exhibits no disease. 2nd RPL lesion is 100% stenosed. 3rd RPL lesion is 100% stenosed. SVG graft was visualized by angiography and is normal in caliber. The graft exhibits mild . 2nd Mrg lesion is 100% stenosed. Prox Cx to Mid Cx lesion is 40% stenosed.   1.  Severe underlying three-vessel coronary artery disease with patent grafts including LIMA to LAD, SVG to OM and SVG to right PDA.  Patent stent in the native mid left circumflex with moderate in-stent  restenosis.  No significant change in coronary anatomy since most recent cardiac catheterization in 2017.  The patient does have diffuse small vessel disease. 2.  Moderately reduced LV systolic function with an EF of 35 to 40% with mildly elevated left ventricular end-diastolic pressure at 15 mmHg.   Recommendations: Continue medical therapy for coronary artery disease and chronic systolic heart failure.  The patient is not able to tolerate long-acting nitroglycerin due to headaches. For cardiomyopathy, I discontinued amlodipine and indapamide and will start him on small dose carvedilol and losartan.  We could consider switching to [facility] as an outpatient. __________  2D echo 11/2019: 1. Left ventricular ejection fraction, by estimation, is 40 to 45%. The  left ventricle has mildly decreased function. Left ventricular endocardial  border not optimally defined to evaluate regional wall motion. There is  mild left ventricular hypertrophy.   Left  ventricular diastolic parameters are consistent with Grade I  diastolic dysfunction (impaired relaxation).   2. Right ventricular systolic function is normal. The right ventricular  size is normal. There is normal pulmonary artery systolic pressure. The  estimated right ventricular systolic pressure is 0000000 mmHg.   3. Left atrial size was mildly dilated.   4. The mitral valve is normal in structure. No evidence of mitral valve  regurgitation. No evidence of mitral stenosis.   5. The aortic valve is normal in structure. Aortic valve regurgitation is  mild. Mild aortic valve sclerosis is present, with no evidence of aortic  valve stenosis.  Laboratory Data:  Chemistry Recent Labs  Lab 11/14/20 1710  NA 138  K 4.1  CL 99  CO2 29  GLUCOSE 111*  BUN 11  CREATININE 1.07  CALCIUM 9.3  GFRNONAA >60  ANIONGAP 10    Recent Labs  Lab 11/14/20 1710  PROT 6.9  ALBUMIN 4.6  AST 27  ALT 23  ALKPHOS 52  BILITOT 0.9   Hematology Recent Labs   Lab 11/14/20 1710 11/15/20 0418  WBC 7.2 5.3  RBC 4.49 4.25  HGB 14.8 13.8  HCT 43.3 41.2  MCV 96.4 96.9  MCH 33.0 32.5  MCHC 34.2 33.5  RDW 13.3 13.2  PLT 195 163   Cardiac EnzymesNo results for input(s): TROPONINI in the last 168 hours. No results for input(s): TROPIPOC in the last 168 hours.  BNP Recent Labs  Lab 11/14/20 1710  BNP 20.8    DDimer No results for input(s): DDIMER in the last 168 hours.  Radiology/Studies:  DG Chest 2 View  Result Date: 11/14/2020 IMPRESSION: No evidence of acute cardiopulmonary disease. Electronically Signed   By: Margaretha Sheffield MD   On: 11/14/2020 18:56    Assessment and Plan:   1. CAD s/p CABG with redo CABG in 2007 with unstable angina: -Currently, chest pain-free -Heparin drip -ASA/Brilinta -Imdur/carvedilol/Crestor -N.p.o. -R/LHC -Echo -Risks and benefits of cardiac catheterization have been discussed with the patient including risks of bleeding, bruising, infection, kidney damage, stroke, heart attack, and death. The patient understands these risks and is willing to proceed with the procedure. All questions have been answered and concerns listened to  2. HFrEF secondary to ICM: -He does not appear to be grossly volume overloaded -RHC later today to further assess volume status to help gauge potential diuresis -Continue current GDMT including carvedilol, losartan, and spironolactone -Intolerant to Entresto secondary to allergic reaction -Echo as above  3. HTN: -Blood pressure has been reasonably controlled in the ED, though most recent reading is elevated -Continue medications as above with recommendation to titrate as indicated post cath  4. HLD: -LDL 61 -Crestor  5. AAA: -Noted on ultrasound in 08/2020 measuring 3 cm -Repeat ultrasound in 2024   For questions or updates, please contact [place] Please consult [email or web address] for contact info under Cardiology/STEMI.   Signed, Christell Faith, PA-C [place] Pager: [phone] 11/15/2020, 7:15 AM

## 2020-11-16 ENCOUNTER — Encounter: Payer: Self-pay | Admitting: Internal Medicine

## 2020-11-16 DIAGNOSIS — I208 Other forms of angina pectoris: Secondary | ICD-10-CM

## 2020-11-16 DIAGNOSIS — I10 Essential (primary) hypertension: Secondary | ICD-10-CM | POA: Diagnosis not present

## 2020-11-16 DIAGNOSIS — I5043 Acute on chronic combined systolic (congestive) and diastolic (congestive) heart failure: Secondary | ICD-10-CM | POA: Diagnosis not present

## 2020-11-16 DIAGNOSIS — J432 Centrilobular emphysema: Secondary | ICD-10-CM

## 2020-11-16 DIAGNOSIS — I25119 Atherosclerotic heart disease of native coronary artery with unspecified angina pectoris: Secondary | ICD-10-CM

## 2020-11-16 DIAGNOSIS — Z951 Presence of aortocoronary bypass graft: Secondary | ICD-10-CM

## 2020-11-16 DIAGNOSIS — K219 Gastro-esophageal reflux disease without esophagitis: Secondary | ICD-10-CM

## 2020-11-16 DIAGNOSIS — J449 Chronic obstructive pulmonary disease, unspecified: Secondary | ICD-10-CM

## 2020-11-16 LAB — BASIC METABOLIC PANEL
Anion gap: 6 (ref 5–15)
BUN: 12 mg/dL (ref 8–23)
CO2: 25 mmol/L (ref 22–32)
Calcium: 8.7 mg/dL — ABNORMAL LOW (ref 8.9–10.3)
Chloride: 105 mmol/L (ref 98–111)
Creatinine, Ser: 0.9 mg/dL (ref 0.61–1.24)
GFR, Estimated: >60 mL/min (ref >60)
Glucose, Bld: 106 mg/dL — ABNORMAL HIGH (ref 70–99)
Potassium: 3.9 mmol/L (ref 3.5–5.1)
Sodium: 136 mmol/L (ref 135–145)

## 2020-11-16 LAB — ECHOCARDIOGRAM COMPLETE
Area-P 1/2: 3.85 cm2
Height: 68.5 in
P 1/2 time: 391 msec
S' Lateral: 3.78 cm
Weight: 3200 oz

## 2020-11-16 LAB — CBC
HCT: 39.6 % (ref 39.0–52.0)
Hemoglobin: 13.3 g/dL (ref 13.0–17.0)
MCH: 32.4 pg (ref 26.0–34.0)
MCHC: 33.6 g/dL (ref 30.0–36.0)
MCV: 96.6 fL (ref 80.0–100.0)
Platelets: 164 10*3/uL (ref 150–400)
RBC: 4.1 MIL/uL — ABNORMAL LOW (ref 4.22–5.81)
RDW: 13.4 % (ref 11.5–15.5)
WBC: 7.5 10*3/uL (ref 4.0–10.5)
nRBC: 0 % (ref 0.0–0.2)

## 2020-11-16 LAB — GLUCOSE, CAPILLARY: Glucose-Capillary: 108 mg/dL — ABNORMAL HIGH (ref 70–99)

## 2020-11-16 MED ORDER — TORSEMIDE 20 MG PO TABS: 40.0000 mg | ORAL_TABLET | Freq: Every day | ORAL | Status: DC

## 2020-11-16 MED ORDER — TORSEMIDE 20 MG PO TABS: 20.0000 mg | ORAL_TABLET | Freq: Every day | ORAL | 0 refills | Status: DC

## 2020-11-16 MED ORDER — POTASSIUM CHLORIDE CRYS ER 20 MEQ PO TBCR: 20.0000 meq | EXTENDED_RELEASE_TABLET | Freq: Every day | ORAL | Status: DC

## 2020-11-16 NOTE — Consult Note (Signed)
   Heart Failure Nurse Navigator Note  HFrEF 35-40 %.  Wall motion abnormalities.  Grade 1 diastolic dysfunction.  Right ventricular systolic function is low normal.  Mild aortic insufficiency.   He was sent from the cardiology office with complaints of chest pain and shortness of breath.   Comorbidities:  Coronary artery disease with coronary artery bypass grafting in 1993 in 2007, stenting to the saphenous vein graft of the right PDA and now stenting to the saphenous vein graft to the obtuse marginal. Hypertension Hyperlipidemia Obstructive sleep apnea   Medications:  Aspirin 81 mg daily Carvedilol 3.125 mg 2 times a day Isosorbide mononitrate 60 mg daily Losartan 25 mg daily Crestor 40 mg daily Spironolactone 25 mg daily  He is allergic to Entresto.  [name] was listed on home medication list.  Labs:  Sodium 136, potassium 3.9, chloride 105, CO2 25, BUN 12, creatinine 0.9.  Admission BNP was 20.8 Weight was 90.7 kg Blood pressure 136/87   Met with patient and wife was at the bedside.  He states that since 2018 he has weighed himself daily and recorded his blood pressure.  He states that he uses very little salt, will sometimes put a little on the tomato.  They do not cook with it.  He eats a lot of fruit and vegetables.  He states that he takes all his medications as ordered except on Sunday we will wait to take it.  He states that he was active with heart track and would like to get back into that activity again.  Discussed the heart failure clinic and he was given an appointment for August 3 at 2:30 in the afternoon.  He was given the living with heart failure teaching booklet along with his zone magnet and information on low-sodium.  Pricilla Riffle RN CHFN

## 2020-11-16 NOTE — Progress Notes (Signed)
SLP Cancellation Note  Patient Details Name: Willie Clark MRN: [id number] DOB: 1950/04/28   Cancelled treatment:       Reason Eval/Treat Not Completed: SLP screened, no needs identified, will sign off  Pt screened, he further states that he feels as if he can't breath or swallow when laying flat on his back. At this time, pt doesn't appear to be experiencing s/s of oropharyngeal dysphagia. Education provided to pt and his wife.   Signe Tackitt B. Rutherford Nail M.S., CCC-SLP, [place] Office [phone]  Willie Clark Rutherford Nail 11/16/2020, 1:32 PM

## 2020-11-16 NOTE — Progress Notes (Signed)
Progress Note  Patient Name: Willie Clark Date of Encounter: 11/16/2020  [facility] Cardiologist: [name], MD   Subjective   Denies chest pain Has continued shortness of breath Does not feel Lasix is working well Has done minimal ambulation around the room, not much in the hallways Discussed recent cardiac catheterization results, stent placement  Inpatient Medications    Scheduled Meds:  aspirin EC  81 mg Oral Daily   carvedilol  3.125 mg Oral BID WC   citalopram  20 mg Oral Daily   enoxaparin (LOVENOX) injection  40 mg Subcutaneous Q24H   finasteride  5 mg Oral Daily   gabapentin  800 mg Oral TID   isosorbide mononitrate  60 mg Oral Daily   losartan  25 mg Oral Daily   pantoprazole  40 mg Oral BID   [START ON 11/17/2020] potassium chloride  20 mEq Oral Daily   rosuvastatin  40 mg Oral QHS   sodium chloride flush  3 mL Intravenous Q12H   sodium chloride flush  3 mL Intravenous Q12H   spironolactone  25 mg Oral Daily   ticagrelor  90 mg Oral BID   [START ON 11/17/2020] torsemide  40 mg Oral Daily   vitamin B-12  1,000 mcg Oral Daily   Continuous Infusions:  sodium chloride     sodium chloride     PRN Meds: sodium chloride, sodium chloride, acetaminophen, albuterol, nitroGLYCERIN, ondansetron (ZOFRAN) IV, ondansetron, sodium chloride flush, sodium chloride flush   Vital Signs    Vitals:   11/15/20 2332 11/16/20 0357 11/16/20 0752 11/16/20 1140  BP: 110/80 106/65 131/67 136/87  Pulse: 68 61 63 66  Resp: '18 16 17 17  '$ Temp: 98.4 F (36.9 C) 98.2 F (36.8 C) 98.3 F (36.8 C) 98.2 F (36.8 C)  TempSrc: Oral Oral Oral   SpO2: 97% 99% 97% 100%  Weight:      Height:        Intake/Output Summary (Last 24 hours) at 11/16/2020 1824 Last data filed at 11/16/2020 1346 Gross per 24 hour  Intake 1320 ml  Output --  Net 1320 ml   Last 3 Weights 11/15/2020 11/14/2020 11/14/2020  Weight (lbs) 200 lb 200 lb 200 lb 2 oz  Weight (kg) 90.719 kg 90.719 kg 90.776  kg      Telemetry    Normal sinus rhythm- Personally Reviewed  ECG    - Personally Reviewed  Physical Exam   GEN: No acute distress.   Neck:  JVD 8+ Cardiac: RRR, no murmurs, rubs, or gallops.  Respiratory: Clear to auscultation bilaterally.  Scattered Rales at the base GI: Soft, nontender, non-distended  MS: Trace lower extremity edema; No deformity. Neuro:  Nonfocal  Psych: Normal affect   Labs    High Sensitivity Troponin:   Recent Labs  Lab 11/14/20 1710 11/14/20 2042  TROPONINIHS 8 8      Chemistry Recent Labs  Lab 11/14/20 1710 11/16/20 0354  NA 138 136  K 4.1 3.9  CL 99 105  CO2 29 25  GLUCOSE 111* 106*  BUN 11 12  CREATININE 1.07 0.90  CALCIUM 9.3 8.7*  PROT 6.9  --   ALBUMIN 4.6  --   AST 27  --   ALT 23  --   ALKPHOS 52  --   BILITOT 0.9  --   GFRNONAA >60 >60  ANIONGAP 10 6     Hematology Recent Labs  Lab 11/14/20 1710 11/15/20 0418 11/16/20 0354  WBC 7.2  5.3 7.5  RBC 4.49 4.25 4.10*  HGB 14.8 13.8 13.3  HCT 43.3 41.2 39.6  MCV 96.4 96.9 96.6  MCH 33.0 32.5 32.4  MCHC 34.2 33.5 33.6  RDW 13.3 13.2 13.4  PLT 195 163 164    BNP Recent Labs  Lab 11/14/20 1710  BNP 20.8     DDimer No results for input(s): DDIMER in the last 168 hours.   Radiology    CARDIAC CATHETERIZATION  Result Date: 11/15/2020 Conclusions: Severe three-vessel coronary artery disease including chronic total occlusions of the ostial LAD, OM1, and proximal RCA. Patent LCx stent with moderate in-stent restenosis (~50%), similar to prior catheterizations. Widely patent LIMA to LAD. Patent SVG to RPDA with patent proximal/mid graft stents that demonstrate mild in-stent restenosis (~10%).  Distal anastomotic stent is widely patent.  There is significant ectasia in the midportion of the graft. Patent SVG to OM with sequential 75% and 60% ostial and mid graft stenoses. Upper normal to mildly elevated left and right heart filling pressures. Normal pulmonary artery  pressure. Mildly reduced Fick cardiac output/index. Successful PCI to SVG to OM with nonoverlapping Resolute Onyx 3.0 x 12 mm (ostial) and 3.0 x 15 mm (mid graft) drug-eluting stents with 0% residual stenosis and TIMI-3 flow. Recommendations: Overnight observation. Continue dual antiplatelet therapy with aspirin and ticagrelor for at least 12 months, ideally longer.  We will plan to CYP2C19 genotype to see if he is an appropriate clopidogrel responder in case dyspnea persists and alternative therapies need to be considered. Aggressive secondary prevention. Obtain echocardiogram. Optimize goal-directed medical therapy for chronic HFrEF that appears relatively well compensated at this time based on RHC findings. Nelva Bush, MD [facility]  ECHOCARDIOGRAM COMPLETE  Result Date: 11/16/2020    ECHOCARDIOGRAM REPORT   Patient Name:   Willie Clark Date of Exam: 11/15/2020 Medical Rec #:  [id number]     Height:       68.5 in Accession #:    [id number]    Weight:       200.0 lb Date of Birth:  1950-07-14     BSA:          2.055 m Patient Age:    70 years      BP:           105/71 mmHg Patient Gender: M             HR:           60 bpm. Exam Location:  [facility] Procedure: 2D Echo, Cardiac Doppler, Color Doppler and Intracardiac            Opacification Agent Indications:     I24.9 Acute ischemic heart disease  History:         Patient has prior history of Echocardiogram examinations, most                  recent 06/20/2020. Risk Factors:Hypertension and Dyslipidemia.                  Syncope and collapse. Chest pain. Obstructive sleep apnea.                  Pulmonary embolism.  Sonographer:     Wilford Sports Rodgers-Jones Referring Phys:  [place] Diagnosing Phys: Ida Rogue MD IMPRESSIONS  1. Challenging images, definity used  2. Left ventricular ejection fraction, by estimation, is 40 to 45 %. The left ventricle has mildly decreased function. Mild global hypokinesis with moderate anteroseptal wall  hypokinesis possibly secondary to postoperative state, and inferior wall hypokinesis.  3. Right ventricular systolic function is normal. The right ventricular size is normal. There is normal pulmonary artery systolic pressure. The estimated right ventricular systolic pressure is 99991111 mmHg.  4. Left atrial size was mildly dilated.  5. The mitral valve is normal in structure. Mild mitral valve regurgitation. FINDINGS  Left Ventricle: Left ventricular ejection fraction, by estimation, is 40 to 45%. The left ventricle has mildly decreased function. The left ventricle demonstrates regional wall motion abnormalities. Definity contrast agent was given IV to delineate the left ventricular endocardial borders. The left ventricular internal cavity size was normal in size. There is no left ventricular hypertrophy. Left ventricular diastolic parameters are consistent with Grade I diastolic dysfunction (impaired relaxation). Right Ventricle: The right ventricular size is normal. No increase in right ventricular wall thickness. Right ventricular systolic function is normal. There is normal pulmonary artery systolic pressure. The tricuspid regurgitant velocity is 2.23 m/s, and  with an assumed right atrial pressure of 5 mmHg, the estimated right ventricular systolic pressure is 99991111 mmHg. Left Atrium: Left atrial size was mildly dilated. Right Atrium: Right atrial size was normal in size. Pericardium: There is no evidence of pericardial effusion. Mitral Valve: The mitral valve is normal in structure. Mild mitral valve regurgitation. No evidence of mitral valve stenosis. Tricuspid Valve: The tricuspid valve is normal in structure. Tricuspid valve regurgitation is mild . No evidence of tricuspid stenosis. Aortic Valve: The aortic valve is normal in structure. Aortic valve regurgitation is trivial. Aortic regurgitation PHT measures 391 msec. No aortic stenosis is present. Pulmonic Valve: The pulmonic valve was normal in structure.  Pulmonic valve regurgitation is not visualized. No evidence of pulmonic stenosis. Aorta: The aortic root is normal in size and structure. Venous: The inferior vena cava is normal in size with greater than 50% respiratory variability, suggesting right atrial pressure of 3 mmHg. IAS/Shunts: No atrial level shunt detected by color flow Doppler.  LEFT VENTRICLE PLAX 2D LVIDd:         5.26 cm  Diastology LVIDs:         3.78 cm  LV e' medial:    7.40 cm/s LV PW:         0.94 cm  LV E/e' medial:  6.8 LV IVS:        0.74 cm  LV e' lateral:   9.79 cm/s LVOT diam:     1.90 cm  LV E/e' lateral: 5.1 LV SV:         48 LV SV Index:   23 LVOT Area:     2.84 cm  RIGHT VENTRICLE RV Basal diam:  4.92 cm RV S prime:     8.05 cm/s TAPSE (M-mode): 1.5 cm LEFT ATRIUM             Index       RIGHT ATRIUM           Index LA diam:        4.60 cm 2.24 cm/m  RA Area:     13.20 cm LA Vol (A2C):   41.9 ml 20.39 ml/m RA Volume:   35.50 ml  17.28 ml/m LA Vol (A4C):   58.7 ml 28.57 ml/m LA Biplane Vol: 51.3 ml 24.96 ml/m  AORTIC VALVE LVOT Vmax:   77.80 cm/s LVOT Vmean:  54.633 cm/s LVOT VTI:    0.170 m AI PHT:      391 msec  AORTA Ao Root diam: 3.70  cm Ao Asc diam:  3.50 cm MITRAL VALVE               TRICUSPID VALVE MV Area (PHT): 3.85 cm    TR Peak grad:   19.9 mmHg MV Decel Time: 197 msec    TR Vmax:        223.00 cm/s MV E velocity: 50.10 cm/s MV A velocity: 67.30 cm/s  SHUNTS MV E/A ratio:  0.74        Systemic VTI:  0.17 m                            Systemic Diam: 1.90 cm Ida Rogue MD Electronically signed by Ida Rogue MD Signature Date/Time: 11/16/2020/2:11:54 PM    Final     Cardiac Studies   Echocardiogram performed yesterday  1. Challenging images, definity used   2. Left ventricular ejection fraction, by estimation, is 40 to 45 %. The  left ventricle has mildly decreased function. Mild global hypokinesis with  moderate anteroseptal wall hypokinesis possibly secondary to postoperative  state, and inferior wall   hypokinesis.   3. Right ventricular systolic function is normal. The right ventricular  size is normal. There is normal pulmonary artery systolic pressure. The  estimated right ventricular systolic pressure is 99991111 mmHg.   4. Left atrial size was mildly dilated.   5. The mitral valve is normal in structure. Mild mitral valve  regurgitation.   Cardiac cath performed yesterday Severe three-vessel coronary artery disease including chronic total occlusions of the ostial LAD, OM1, and proximal RCA. Patent LCx stent with moderate in-stent restenosis (~50%), similar to prior catheterizations. Widely patent LIMA to LAD. Patent SVG to RPDA with patent proximal/mid graft stents that demonstrate mild in-stent restenosis (~10%).  Distal anastomotic stent is widely patent.  There is significant ectasia in the midportion of the graft. Patent SVG to OM with sequential 75% and 60% ostial and mid graft stenoses. Upper normal to mildly elevated left and right heart filling pressures. Normal pulmonary artery pressure. Mildly reduced Fick cardiac output/index. Successful PCI to SVG to OM with nonoverlapping Resolute Onyx 3.0 x 12 mm (ostial) and 3.0 x 15 mm (mid graft) drug-eluting stents with 0% residual stenosis and TIMI-3 flow.    Patient Profile     Willie Clark is a 70 y.o. male with a hx of CAD s/p CABG in 1993 with redo in 2007, HFrEF secondary to ICM, fibromyalgia, chronic pain, gastric ulcer, HTN, HLD, OSA on CPAP, postoperative PE s/p therapy, CVA, SBO, Barrett's esophagus, diverticulitis, hemorrhoids, nephrolithiasis, orthostatic hypotension, and cervical spine pain s/p prior surgeries who is being seen today for the evaluation of unstable angina  Assessment & Plan    1. CAD s/p CABG with redo CABG in 2007 with unstable angina: Catheterization yesterday with successful PCI to vein graft to the OM with nonoverlapping resolute Onyx 3.0 x 12 mm ostial and 3.0 x 15 mm mid graft drug-eluting  stent -Long discussion concerning medications, Stressed compliance with aspirin, Brilinta, Imdur, carvedilol and Crestor -We will change Lasix to torsemide as below   2. HFrEF secondary to ICM: Reports continued shortness of breath, likely exacerbated by underlying ischemia, COPD For symptom relief we will try slightly more aggressive diuresis with close monitoring of renal function as outpatient -Recommend he stop Lasix, transition to torsemide 20 daily with extra 20 mg as needed for ankle swelling abdominal distention shortness of breath Suggested he not exceed 40  mg daily   3. HTN: Blood pressure is well controlled on today's visit. No changes made to the medications.   4. HLD: -LDL 61, continue Crestor   5. AAA: -Noted on ultrasound in 08/2020 measuring 3 cm -Repeat ultrasound in 2024  Discharge instructions discussed, discussed catheterization results, medication changes as above  Total encounter time more than 35 minutes  Greater than 50% was spent in counseling and coordination of care with the patient   For questions or updates, please contact [place] Please consult [email or web address] for contact info under        Signed, Ida Rogue, MD  11/16/2020, 6:24 PM

## 2020-11-16 NOTE — Discharge Summary (Signed)
Physician Discharge Summary  Willie Clark [id number] DOB: [birth year] DOA: 11/14/2020  PCP: [name], MD (Inactive)  Admit date: 11/14/2020 Discharge date: 11/16/2020  Admitted From: home  Disposition: home  Recommendations for Outpatient Follow-up:  Follow up with PCP in 1-2 weeks F/u w/ cardio, Dr. Fletcher Anon, in 1 week   Home Health: no  Equipment/Devices:  Discharge Condition: stable  CODE STATUS:full  Diet recommendation: Heart Healthy   Brief/Interim Summary: HPI was taken from Dr. Florina Ou: Willie Clark is a 70 y.o. male seen in ed with complaints of chest pain and shortness of breath patient being sent to [place] from cardiology office for his presentation.    Duration:few months and worse since yesterday.   Frequency:intermittent   Location:left side of chest    Quality:sharp   Rate: 8/10   Radiation: radiating to btoh his arms   Aggravating: nothing    Alleviating:NTG.   Associated factors:sob/ nausea .   Pt has past medical history of anxiety, arthritis, CAD last stent in march this year , GERD, Gastritis,Headache, hemorrhoids, CHF, HTN, kidney stones,PE, and Stroke.    Hospital course from Dr. Jimmye Norman 7/27-7/28/22: Pt presented w/ chest pain and pt was taken for a cardiac cath which showed severe native CAD w/ stents placed in ostial & mid SVT-OM as per cardio. Pt was continue on aspirin, brilinta, imdur, aldactone, coreg, losartan & statin. Pt's lasix was change to torsemide '20mg'$  daily w/ extra '20mg'$  prn for shortness of breath/leg swelling as per cardio. Pt will f/u cardio, Dr. Fletcher Anon, in 1 week. For more information, please see previous progress/consult notes.   Discharge Diagnoses:  Active Problems:   Coronary artery disease involving native coronary artery of native heart with angina pectoris (HCC)   Essential hypertension   GERD (gastroesophageal reflux disease)   HFrEF (heart failure with reduced ejection fraction) (HCC)   Chest pain due to  coronary artery disease (HCC)  Chest pain: w/ hx of CABG x2. Nitro, morphine prn. S/p cardiac cath shows severe native CAD w/ stents placed in ostial & mid SVT-OM as per cardio. Continue on aspirin, brilinta, imdur, aldactone,coreg, losartan and statin. IV heparin drip was d/c. Cardio recs apprec    HTN: continue on coreg, losartan, imdur   HLD: continue on statin    GERD: continue on PPI   Chronic combined CHF: echo in 3/22 showed EF [id number], grade I diastolic dysfunction & regional wall motion abnormalities. Monitor I/Os. Continue on coreg, aspirin, imdur, aldactone  Likely peripheral neuropathy: continue on home dose of gabapentin    Discharge Instructions  Discharge Instructions     AMB Referral to Cardiac Rehabilitation - Phase II   Complete by: As directed    Diagnosis: Coronary Stents   After initial evaluation and assessments completed: Virtual Based Care may be provided alone or in conjunction with Phase 2 Cardiac Rehab based on patient barriers.: Yes   Diet - low sodium heart healthy   Complete by: As directed    Discharge instructions   Complete by: As directed    F/u w/ PCP in 1-2 weeks. F/u w/ cardio, Dr. Fletcher Anon, in 1 week   Increase activity slowly   Complete by: As directed       Allergies as of 11/16/2020       Reactions   Amitriptyline Other (See Comments)   Pt unsure of what happens Other reaction(s): Other (See Comments) Pt unsure of what happens Pt unsure of what happens   Cyclobenzaprine Other (See  Comments), Hives   Pt unsure what hapens Pt unsure what hapens Pt unsure what hapens   Dicyclomine Shortness Of Breath, Swelling   Sacubitril-valsartan Anaphylaxis, Itching   Amitriptyline Hcl    Berniece Salines Flavor Diarrhea   Hydrocodone    Fish Oil Itching   Lidocaine Nausea And Vomiting, Other (See Comments), Nausea Only   Nasal Spray (only time had reaction) Other reaction(s): Dizziness Nasal Spray (only time had reaction)   Omega-3 Fatty Acids-vitamin E  Itching   Other Diarrhea, Itching   Reactive agents: Onions, bacon, steak   Oxycodone-acetaminophen Other (See Comments)   Lowers BP Other reaction(s): Other (See Comments) Lowers BP Lowers BP   Sulfa Antibiotics Itching        Medication List     STOP taking these medications    dicyclomine 10 MG capsule Commonly known as: BENTYL   Erenumab-aooe 140 MG/ML Soaj   furosemide 40 MG tablet Commonly known as: LASIX       TAKE these medications    albuterol 108 (90 Base) MCG/ACT inhaler Commonly known as: VENTOLIN HFA Inhale into the lungs every 4 (four) hours as needed for wheezing or shortness of breath.   aspirin 81 MG EC tablet Take 1 tablet (81 mg total) by mouth daily. Swallow whole.   carvedilol 3.125 MG tablet Commonly known as: COREG Take 1 tablet (3.125 mg total) by mouth 2 (two) times daily with a meal.   citalopram 20 MG tablet Commonly known as: CELEXA Take 20 mg by mouth daily.   dapagliflozin propanediol 10 MG Tabs tablet Commonly known as: Farxiga Take 1 tablet (10 mg total) by mouth daily before breakfast.   EPINEPHrine 0.3 mg/0.3 mL Soaj injection Commonly known as: EPI-PEN Inject 0.3 mg into the muscle as needed.   fexofenadine 180 MG tablet Commonly known as: ALLEGRA Take 180 mg by mouth daily.   finasteride 5 MG tablet Commonly known as: PROSCAR Take 5 mg by mouth daily.   gabapentin 800 MG tablet Commonly known as: NEURONTIN Take 1 tablet by mouth 3 (three) times daily.   HAIR SKIN AND NAILS FORMULA PO Take 1 capsule by mouth daily.   IBGARD PO Take by mouth daily at 2 am.   isosorbide mononitrate 60 MG 24 hr tablet Commonly known as: IMDUR Take 1 tablet (60 mg total) by mouth daily.   losartan 25 MG tablet Commonly known as: COZAAR TAKE 1 TABLET EVERY DAY   Mesalamine 800 MG Tbec Take 1,600 mg by mouth in the morning, at noon, and at bedtime.   nitroGLYCERIN 0.4 MG SL tablet Commonly known as: NITROSTAT Place 1  tablet (0.4 mg total) under the tongue every 5 (five) minutes as needed for chest pain.   ondansetron 4 MG tablet Commonly known as: ZOFRAN Take 4 mg by mouth every 8 (eight) hours as needed for nausea or vomiting.   pantoprazole 40 MG tablet Commonly known as: PROTONIX Take 40 mg by mouth 2 (two) times daily.   potassium chloride 10 MEQ tablet Commonly known as: KLOR-CON Take 1 tablet (10 mEq total) by mouth daily.   potassium chloride SA 20 MEQ tablet Commonly known as: KLOR-CON Take 20 mEq by mouth daily.   pregabalin 100 MG capsule Commonly known as: LYRICA Take 100 mg by mouth 3 (three) times daily.   PROBIOTIC PO Take by mouth daily at 8 pm.   rOPINIRole 1 MG tablet Commonly known as: REQUIP Take 1 mg by mouth 2 (two) times daily.   rosuvastatin  40 MG tablet Commonly known as: CRESTOR Take 1 tablet (40 mg total) by mouth daily.   spironolactone 25 MG tablet Commonly known as: ALDACTONE Take 1 tablet (25 mg total) by mouth daily.   ticagrelor 90 MG Tabs tablet Commonly known as: BRILINTA Take 90 mg by mouth 2 (two) times daily.   torsemide 20 MG tablet Commonly known as: Demadex Take 1 tablet (20 mg total) by mouth daily. But pt may take any extra 20 mg for shortness of breath/leg swelling   traMADol 50 MG tablet Commonly known as: ULTRAM Take 50 mg by mouth every 6 (six) hours as needed.   Vascepa 1 g capsule Generic drug: icosapent Ethyl TAKE 2 CAPSULES TWICE DAILY   vitamin B-12 1000 MCG tablet Commonly known as: CYANOCOBALAMIN Take 1,000 mcg by mouth daily.        Allergies  Allergen Reactions   Amitriptyline Other (See Comments)    Pt unsure of what happens Other reaction(s): Other (See Comments) Pt unsure of what happens Pt unsure of what happens   Cyclobenzaprine Other (See Comments) and Hives    Pt unsure what hapens Pt unsure what hapens Pt unsure what hapens    Dicyclomine Shortness Of Breath and Swelling   Sacubitril-Valsartan  Anaphylaxis and Itching   Amitriptyline Hcl    Berniece Salines Flavor Diarrhea   Hydrocodone    Fish Oil Itching   Lidocaine Nausea And Vomiting, Other (See Comments) and Nausea Only    Nasal Spray (only time had reaction) Other reaction(s): Dizziness Nasal Spray (only time had reaction)   Omega-3 Fatty Acids-Vitamin E Itching   Other Diarrhea and Itching    Reactive agents: Onions, bacon, steak   Oxycodone-Acetaminophen Other (See Comments)    Lowers BP Other reaction(s): Other (See Comments) Lowers BP Lowers BP   Sulfa Antibiotics Itching    Consultations: Cardio    Procedures/Studies: DG Chest 2 View  Result Date: 11/14/2020 CLINICAL DATA:  Chest pain, shortness of breath. EXAM: CHEST - 2 VIEW COMPARISON:  August 11, 2020. FINDINGS: Similar cardiomediastinal silhouette. Prior CABG with median sternotomy. No consolidation. No visible pleural effusions or pneumothorax. Partially imaged cervical ACDF. IMPRESSION: No evidence of acute cardiopulmonary disease. Electronically Signed   By: Margaretha Sheffield MD   On: 11/14/2020 18:56   CARDIAC CATHETERIZATION  Result Date: 11/15/2020 Conclusions: Severe three-vessel coronary artery disease including chronic total occlusions of the ostial LAD, OM1, and proximal RCA. Patent LCx stent with moderate in-stent restenosis (~50%), similar to prior catheterizations. Widely patent LIMA to LAD. Patent SVG to RPDA with patent proximal/mid graft stents that demonstrate mild in-stent restenosis (~10%).  Distal anastomotic stent is widely patent.  There is significant ectasia in the midportion of the graft. Patent SVG to OM with sequential 75% and 60% ostial and mid graft stenoses. Upper normal to mildly elevated left and right heart filling pressures. Normal pulmonary artery pressure. Mildly reduced Fick cardiac output/index. Successful PCI to SVG to OM with nonoverlapping Resolute Onyx 3.0 x 12 mm (ostial) and 3.0 x 15 mm (mid graft) drug-eluting stents with 0%  residual stenosis and TIMI-3 flow. Recommendations: Overnight observation. Continue dual antiplatelet therapy with aspirin and ticagrelor for at least 12 months, ideally longer.  We will plan to CYP2C19 genotype to see if he is an appropriate clopidogrel responder in case dyspnea persists and alternative therapies need to be considered. Aggressive secondary prevention. Obtain echocardiogram. Optimize goal-directed medical therapy for chronic HFrEF that appears relatively well compensated at this time based on  RHC findings. Nelva Bush, MD [facility]  (Echo, Carotid, EGD, Colonoscopy, ERCP)    Subjective: Pt c/o fatigue   Discharge Exam: Vitals:   11/16/20 0752 11/16/20 1140  BP: 131/67 136/87  Pulse: 63 66  Resp: 17 17  Temp: 98.3 F (36.8 C) 98.2 F (36.8 C)  SpO2: 97% 100%   Vitals:   11/15/20 2332 11/16/20 0357 11/16/20 0752 11/16/20 1140  BP: 110/80 106/65 131/67 136/87  Pulse: 68 61 63 66  Resp: '18 16 17 17  '$ Temp: 98.4 F (36.9 C) 98.2 F (36.8 C) 98.3 F (36.8 C) 98.2 F (36.8 C)  TempSrc: Oral Oral Oral   SpO2: 97% 99% 97% 100%  Weight:      Height:        General: Pt is alert, awake, not in acute distress Cardiovascular: S1/S2 +, no rubs, no gallops Respiratory: CTA bilaterally, no wheezing, no rhonchi Abdominal: Soft, NT, ND, bowel sounds + Extremities: no cyanosis    The results of significant diagnostics from this hospitalization (including imaging, microbiology, ancillary and laboratory) are listed below for reference.     Microbiology: Recent Results (from the past 240 hour(s))  Resp Panel by RT-PCR (Flu A&B, Covid) Nasopharyngeal Swab     Status: None   Collection Time: 11/14/20  5:18 PM   Specimen: Nasopharyngeal Swab; Nasopharyngeal(NP) swabs in vial transport medium  Result Value Ref Range Status   SARS Coronavirus 2 by RT PCR NEGATIVE NEGATIVE Final    Comment: (NOTE) SARS-CoV-2 target nucleic acids are NOT DETECTED.  The SARS-CoV-2 RNA  is generally detectable in upper respiratory specimens during the acute phase of infection. The lowest concentration of SARS-CoV-2 viral copies this assay can detect is 138 copies/mL. A negative result does not preclude SARS-Cov-2 infection and should not be used as the sole basis for treatment or other patient management decisions. A negative result may occur with  improper specimen collection/handling, submission of specimen other than nasopharyngeal swab, presence of viral mutation(s) within the areas targeted by this assay, and inadequate number of viral copies(<138 copies/mL). A negative result must be combined with clinical observations, patient history, and epidemiological information. The expected result is Negative.  Fact Sheet for Patients:  EntrepreneurPulse.com.au  Fact Sheet for Healthcare Providers:  IncredibleEmployment.be  This test is no t yet approved or cleared by the [place] FDA and  has been authorized for detection and/or diagnosis of SARS-CoV-2 by FDA under an Emergency Use Authorization (EUA). This EUA will remain  in effect (meaning this test can be used) for the duration of the COVID-19 declaration under Section 564(b)(1) of the Act, 21 U.S.C.section 360bbb-3(b)(1), unless the authorization is terminated  or revoked sooner.       Influenza A by PCR NEGATIVE NEGATIVE Final   Influenza B by PCR NEGATIVE NEGATIVE Final    Comment: (NOTE) The Xpert Xpress SARS-CoV-2/FLU/RSV plus assay is intended as an aid in the diagnosis of influenza from Nasopharyngeal swab specimens and should not be used as a sole basis for treatment. Nasal washings and aspirates are unacceptable for Xpert Xpress SARS-CoV-2/FLU/RSV testing.  Fact Sheet for Patients: EntrepreneurPulse.com.au  Fact Sheet for Healthcare Providers: IncredibleEmployment.be  This test is not yet approved or cleared by the  [place] FDA and has been authorized for detection and/or diagnosis of SARS-CoV-2 by FDA under an Emergency Use Authorization (EUA). This EUA will remain in effect (meaning this test can be used) for the duration of the COVID-19 declaration under Section 564(b)(1) of the  Act, 21 U.S.C. section 360bbb-3(b)(1), unless the authorization is terminated or revoked.  Performed at [facility], [address] [place], [place] [address]      Labs: BNP (last 3 results) Recent Labs    08/25/20 1449 11/14/20 1710  BNP 39.5 [id number]   Basic Metabolic Panel: Recent Labs  Lab 11/14/20 1710 11/16/20 0354  NA 138 136  K 4.1 3.9  CL 99 105  CO2 29 25  GLUCOSE 111* 106*  BUN 11 12  CREATININE 1.07 0.90  CALCIUM 9.3 8.7*   Liver Function Tests: Recent Labs  Lab 11/14/20 1710  AST 27  ALT 23  ALKPHOS 52  BILITOT 0.9  PROT 6.9  ALBUMIN 4.6   No results for input(s): LIPASE, AMYLASE in the last 168 hours. No results for input(s): AMMONIA in the last 168 hours. CBC: Recent Labs  Lab 11/14/20 1710 11/15/20 0418 11/16/20 0354  WBC 7.2 5.3 7.5  NEUTROABS 4.1  --   --   HGB 14.8 13.8 13.3  HCT 43.3 41.2 39.6  MCV 96.4 96.9 96.6  PLT 195 163 164   Cardiac Enzymes: No results for input(s): CKTOTAL, CKMB, CKMBINDEX, TROPONINI in the last 168 hours. BNP: Invalid input(s): POCBNP CBG: Recent Labs  Lab 11/15/20 2049 11/16/20 0754  GLUCAP 105* 108*   D-Dimer No results for input(s): DDIMER in the last 72 hours. Hgb A1c Recent Labs    11/14/20 2042  HGBA1C 6.1*   Lipid Profile No results for input(s): CHOL, HDL, LDLCALC, TRIG, CHOLHDL, LDLDIRECT in the last 72 hours. Thyroid function studies Recent Labs    11/14/20 2042  TSH 2.375   Anemia work up No results for input(s): VITAMINB12, FOLATE, FERRITIN, TIBC, IRON, RETICCTPCT in the last 72 hours. Urinalysis    Component Value Date/Time   COLORURINE YELLOW (A) 08/11/2020 2100   APPEARANCEUR HAZY (A)  08/11/2020 2100   LABSPEC 1.025 08/11/2020 2100   PHURINE 5.0 08/11/2020 2100   GLUCOSEU NEGATIVE 08/11/2020 2100   HGBUR NEGATIVE 08/11/2020 2100   BILIRUBINUR NEGATIVE 08/11/2020 2100   KETONESUR 5 (A) 08/11/2020 2100   PROTEINUR NEGATIVE 08/11/2020 2100   NITRITE NEGATIVE 08/11/2020 2100   LEUKOCYTESUR NEGATIVE 08/11/2020 2100   Sepsis Labs Invalid input(s): PROCALCITONIN,  WBC,  LACTICIDVEN Microbiology Recent Results (from the past 240 hour(s))  Resp Panel by RT-PCR (Flu A&B, Covid) Nasopharyngeal Swab     Status: None   Collection Time: 11/14/20  5:18 PM   Specimen: Nasopharyngeal Swab; Nasopharyngeal(NP) swabs in vial transport medium  Result Value Ref Range Status   SARS Coronavirus 2 by RT PCR NEGATIVE NEGATIVE Final    Comment: (NOTE) SARS-CoV-2 target nucleic acids are NOT DETECTED.  The SARS-CoV-2 RNA is generally detectable in upper respiratory specimens during the acute phase of infection. The lowest concentration of SARS-CoV-2 viral copies this assay can detect is 138 copies/mL. A negative result does not preclude SARS-Cov-2 infection and should not be used as the sole basis for treatment or other patient management decisions. A negative result may occur with  improper specimen collection/handling, submission of specimen other than nasopharyngeal swab, presence of viral mutation(s) within the areas targeted by this assay, and inadequate number of viral copies(<138 copies/mL). A negative result must be combined with clinical observations, patient history, and epidemiological information. The expected result is Negative.  Fact Sheet for Patients:  EntrepreneurPulse.com.au  Fact Sheet for Healthcare Providers:  IncredibleEmployment.be  This test is no t yet approved or cleared by the [place] FDA  and  has been authorized for detection and/or diagnosis of SARS-CoV-2 by FDA under an Emergency Use Authorization (EUA). This  EUA will remain  in effect (meaning this test can be used) for the duration of the COVID-19 declaration under Section 564(b)(1) of the Act, 21 U.S.C.section 360bbb-3(b)(1), unless the authorization is terminated  or revoked sooner.       Influenza A by PCR NEGATIVE NEGATIVE Final   Influenza B by PCR NEGATIVE NEGATIVE Final    Comment: (NOTE) The Xpert Xpress SARS-CoV-2/FLU/RSV plus assay is intended as an aid in the diagnosis of influenza from Nasopharyngeal swab specimens and should not be used as a sole basis for treatment. Nasal washings and aspirates are unacceptable for Xpert Xpress SARS-CoV-2/FLU/RSV testing.  Fact Sheet for Patients: EntrepreneurPulse.com.au  Fact Sheet for Healthcare Providers: IncredibleEmployment.be  This test is not yet approved or cleared by the [place] FDA and has been authorized for detection and/or diagnosis of SARS-CoV-2 by FDA under an Emergency Use Authorization (EUA). This EUA will remain in effect (meaning this test can be used) for the duration of the COVID-19 declaration under Section 564(b)(1) of the Act, 21 U.S.C. section 360bbb-3(b)(1), unless the authorization is terminated or revoked.  Performed at [facility], [address], [place], [place] [address]      Time coordinating discharge: Over 30 minutes  SIGNED:   Wyvonnia Dusky, MD  [facility] 11/16/2020, 12:42 PM Pager   If 7PM-7AM, please contact night-coverage

## 2020-11-17 ENCOUNTER — Other Ambulatory Visit: Payer: Self-pay | Admitting: Cardiovascular Disease

## 2020-11-17 NOTE — Progress Notes (Signed)
Patient ID: Willie Clark, male    DOB: Aug 03, 1950, 70 y.o.   MRN: [id number]  HPI  Willie Clark is a 70 y/o male with a history of CAD, hyperlipidemia, HTN, stroke, anxiety, depression, GERD, obstructive sleep apnea, PE, remote tobacco use and chronic heart failure.   Echo report from 11/15/20 reviewed and showed an EF of 40-45% along with mild LAE, mild Willie and normal PA pressure of 24.9 mmHg.   LHC/RHC completed 11/15/20 and showed: Severe three-vessel coronary artery disease including chronic total occlusions of the ostial LAD, OM1, and proximal RCA. Patent LCx stent with moderate in-stent restenosis (~50%), similar to prior catheterizations. Widely patent LIMA to LAD. Patent SVG to RPDA with patent proximal/mid graft stents that demonstrate mild in-stent restenosis (~10%).  Distal anastomotic stent is widely patent.  There is significant ectasia in the midportion of the graft. Patent SVG to OM with sequential 75% and 60% ostial and mid graft stenoses. Upper normal to mildly elevated left and right heart filling pressures. Normal pulmonary artery pressure. Mildly reduced Fick cardiac output/index. Successful PCI to SVG to OM with nonoverlapping Resolute Onyx 3.0 x 12 mm (ostial) and 3.0 x 15 mm (mid graft) drug-eluting stents with 0% residual stenosis and TIMI-3 flow.  Admitted 11/14/20 due to chest pain and shortness of breath. Cardiology consult obtained & cath completed. Diuretic changed. Discharged after 2 days.   Willie Clark presents today for his initial visit with a chief complaint of moderate shortness of breath upon minimal exertion. Willie Clark describes this as chronic in nature having been present for several years. Willie Clark has associated fatigue, cough, sporadic chest pain, difficulty sleeping and light-headedness along with this. Willie Clark denies any abdominal distention, palpitations, pedal edema or weight gain.   Does note that even if Willie Clark eats a small amount, Willie Clark becomes full and feels bloated.   Past Medical  History:  Diagnosis Date   Anxiety    Arthritis    Barrett's esophagus    Bowel obstruction ([place]) 02/2009   small bowel   CHF (congestive heart failure) (HCC)    Chronic chest pain    Coronary artery disease    a. 1993 CABG; b. 2007 Redo CABG; c. 09/2015 Cath: LM mild dzs, LAD 100ost, RI mild dzs, LCX 60p, OM1 100, RCA 100ost, VG->OM2 min irregs, LIMA->LAD nl, VG->RPDA 30ost, 20d, nl EF->Med rx.   Depression    Diverticulitis    Diverticulosis    Erectile dysfunction    Fibromyalgia    Gastric ulcer    Gastritis    GERD (gastroesophageal reflux disease)    Headache    Hemorrhoids    HFrEF (heart failure with reduced ejection fraction) ([place])    a. 09/2016 Echo: EF 55-60%, no rwma, triv AI, mild Willie, mildly dil LA. -> as of April 2020: EF 40 to 45%.  Septal dyssynergy/hypokinesis due to postop state but otherwise unable to assess wall motion [name] due to poor study.   History of myocardial infarct at age less than 55 years    Hyperlipidemia    Hypertension    Kidney stones    going to [facility]  June 26 to see kidney specialist   Lightheadedness    Mass on back    Nephrolithiasis    Orthostatic hypotension    a. Improved after discontinuation of metoprolol.   OSA (obstructive sleep apnea)    a. On CPAP.   Pulmonary embolism ([place]) 01/2006   Post-op/treated   Skin lesions, generalized  Stroke [facility]) 1993   right side weakness no blood thinners   on Aspirin   Syncope and collapse    Urticaria    Past Surgical History:  Procedure Laterality Date   BACK SURGERY     bowel obstruction  01/2009   CARDIAC CATHETERIZATION  11/2006   patent grafts. Significant OM3 disease and occluded diagonals.   CARDIAC CATHETERIZATION  06/2010   patent grafts. significant ISR in proximal LCX but OM2 is bypassed and gives retrograde flow to OM3.    CARDIAC CATHETERIZATION  06/2013   [facility]: Patent grafts with 60% proximal in-stent restenosis in the left circumflex with FFR of 0.85   CARDIAC  CATHETERIZATION Left 09/25/2015   Procedure: Left Heart Cath and Cors/Grafts Angiography;  Surgeon: Wellington Hampshire, MD;  Location: [place] [facility];  Service: Cardiovascular;  Laterality: Left;   CERVICAL FUSION     CHOLECYSTECTOMY     COLON SURGERY  01/2009   BOWEL RESECTION DUE TO SMALL BOWEL OBSTRUCTION   COLONOSCOPY     COLONOSCOPY WITH PROPOFOL N/A 10/28/2017   Procedure: COLONOSCOPY WITH PROPOFOL;  Surgeon: Lollie Sails, MD;  Location: [facility] ENDOSCOPY;  Service: Endoscopy;  Laterality: N/A;   CORONARY ANGIOPLASTY  2006   CORONARY ARTERY BYPASS GRAFT  1993/01/2006   redo at [facility]. LIMA to LAD, SVG to OM2 and SVG to RPDA   CORONARY STENT INTERVENTION N/A 06/20/2020   Procedure: CORONARY STENT INTERVENTION;  Surgeon: Nelva Bush, MD;  Location: [place] [facility];  Service: Cardiovascular;  Laterality: N/A;   CORONARY STENT INTERVENTION N/A 11/15/2020   Procedure: CORONARY STENT INTERVENTION;  Surgeon: Nelva Bush, MD;  Location: [place] [facility];  Service: Cardiovascular;  Laterality: N/A;   CYSTOSCOPY WITH STENT PLACEMENT Bilateral 09/24/2015   Procedure: CYSTOSCOPY WITH BILATERAL RETROGRADES, BILATERAL STENT PLACEMENT;  Surgeon: Festus Aloe, MD;  Location: [facility] ORS;  Service: Urology;  Laterality: Bilateral;   ESOPHAGOGASTRODUODENOSCOPY N/A 10/25/2014   Procedure: ESOPHAGOGASTRODUODENOSCOPY (EGD);  Surgeon: Lollie Sails, MD;  Location: [facility] ENDOSCOPY;  Service: Endoscopy;  Laterality: N/A;   ESOPHAGOGASTRODUODENOSCOPY (EGD) WITH PROPOFOL N/A 02/03/2017   Procedure: ESOPHAGOGASTRODUODENOSCOPY (EGD) WITH PROPOFOL;  Surgeon: Lollie Sails, MD;  Location: [facility] END[facility]COPY;  Service: Endoscopy;  Laterality: N/A;   ESOPHAGOGASTRODUODENOSCOPY (EGD) WITH PROPOFOL N/A 04/18/2017   Procedure: ESOPHAGOGASTRODUODENOSCOPY (EGD) WITH PROPOFOL;  Surgeon: Lollie Sails, MD;  Location: [facility] ENDOSCOPY;  Service: Endoscopy;  Laterality: N/A;   EYE SURGERY Bilateral     Cataract Extraction with IOL   HERNIA REPAIR     INGUINAL HERNIA REPAIR Bilateral 01/23/2016   Procedure: HERNIA REPAIR INGUINAL ADULT BILATERAL;  Surgeon: Leonie Green, MD;  Location: [facility] ORS;  Service: General;  Laterality: Bilateral;   LEFT HEART CATH AND CORS/GRAFTS ANGIOGRAPHY N/A 12/13/2019   Procedure: LEFT HEART CATH AND CORS/GRAFTS ANGIOGRAPHY;  Surgeon: Wellington Hampshire, MD;  Location: [place] [facility];  Service: Cardiovascular;  Laterality: N/A;   LEFT HEART CATH AND CORS/GRAFTS ANGIOGRAPHY N/A 06/20/2020   Procedure: LEFT HEART CATH AND CORS/GRAFTS ANGIOGRAPHY;  Surgeon: Nelva Bush, MD;  Location: [place] [facility];  Service: Cardiovascular;  Laterality: N/A;   LUMBAR LAMINECTOMY/DECOMPRESSION MICRODISCECTOMY Left 10/01/2016   Procedure: Left Lumbar four-five Laminotomy for resection of synovial cyst;  Surgeon: Erline Levine, MD;  Location: [place];  Service: Neurosurgery;  Laterality: Left;  left   NECK SURGERY  06/2009   RIGHT/LEFT HEART CATH AND CORONARY/GRAFT ANGIOGRAPHY N/A 11/15/2020   Procedure: RIGHT/LEFT HEART CATH AND CORONARY/GRAFT ANGIOGRAPHY;  Surgeon: Nelva Bush, MD;  Location: [place] [facility];  Service: Cardiovascular;  Laterality: N/A;   SHOULDER SURGERY  2010   Family History  Problem Relation Age of Onset   Heart disease Father 33   Heart attack Father    Social History   Tobacco Use   Smoking status: Former    Packs/day: 2.50    Types: Cigarettes    Quit date: 08/21/1991    Years since quitting: 29.2   Smokeless tobacco: Never  Substance Use Topics   Alcohol use: Not Currently   Allergies  Allergen Reactions   Amitriptyline Other (See Comments)    Pt unsure of what happens Other reaction(s): Other (See Comments) Pt unsure of what happens Pt unsure of what happens   Cyclobenzaprine Other (See Comments) and Hives    Pt unsure what hapens Pt unsure what hapens Pt unsure what hapens    Dicyclomine Shortness Of Breath and  Swelling   Sacubitril-Valsartan Anaphylaxis and Itching   Amitriptyline Hcl    Berniece Salines Flavor Diarrhea   Hydrocodone    Fish Oil Itching   Lidocaine Nausea And Vomiting, Other (See Comments) and Nausea Only    Nasal Spray (only time had reaction) Other reaction(s): Dizziness Nasal Spray (only time had reaction)   Omega-3 Fatty Acids-Vitamin E Itching   Other Diarrhea and Itching    Reactive agents: Onions, bacon, steak   Oxycodone-Acetaminophen Other (See Comments)    Lowers BP Other reaction(s): Other (See Comments) Lowers BP Lowers BP   Sulfa Antibiotics Itching   Prior to Admission medications   Medication Sig Start Date End Date Taking? Authorizing Provider  albuterol (PROVENTIL HFA;VENTOLIN HFA) 108 (90 Base) MCG/ACT inhaler Inhale into the lungs every 4 (four) hours as needed for wheezing or shortness of breath.   Yes [provider]  aspirin EC 81 MG EC tablet Take 1 tablet (81 mg total) by mouth daily. Swallow whole. 06/23/20  Yes Nolberto Hanlon, MD  carvedilol (COREG) 3.125 MG tablet Take 1 tablet (3.125 mg total) by mouth 2 (two) times daily with a meal. 07/03/20  Yes Wellington Hampshire, MD  citalopram (CELEXA) 20 MG tablet Take 20 mg by mouth daily.    Yes [provider]  dapagliflozin propanediol (FARXIGA) 10 MG TABS tablet Take 1 tablet (10 mg total) by mouth daily before breakfast. 11/02/20  Yes Wellington Hampshire, MD  EPINEPHrine 0.3 mg/0.3 mL IJ SOAJ injection Inject 0.3 mg into the muscle as needed.  12/27/19  Yes [provider]  fexofenadine (ALLEGRA) 180 MG tablet Take 180 mg by mouth daily.   Yes [provider]  finasteride (PROSCAR) 5 MG tablet Take 5 mg by mouth daily.   Yes [provider]  gabapentin (NEURONTIN) 800 MG tablet Take 1 tablet by mouth 3 (three) times daily. 12/22/19  Yes [provider]  isosorbide mononitrate (IMDUR) 60 MG 24 hr tablet Take 1 tablet (60 mg total) by mouth daily. 11/06/20  Yes Furth,  Cadence H, PA-C  losartan (COZAAR) 25 MG tablet TAKE 1 TABLET EVERY DAY 10/05/20  Yes Wellington Hampshire, MD  Mesalamine 800 MG TBEC Take 1,600 mg by mouth in the morning, at noon, and at bedtime. 10/25/20 11/24/20 Yes [provider]  Multiple Vitamins-Minerals (HAIR SKIN AND NAILS FORMULA PO) Take 1 capsule by mouth daily.   Yes [provider]  nitroGLYCERIN (NITROSTAT) 0.4 MG SL tablet Place 1 tablet (0.4 mg total) under the tongue every 5 (five) minutes as  needed for chest pain. 06/22/20  Yes Nolberto Hanlon, MD  ondansetron (ZOFRAN) 4 MG tablet Take 4 mg by mouth every 8 (eight) hours as needed for nausea or vomiting.   Yes [provider]  pantoprazole (PROTONIX) 40 MG tablet Take 40 mg by mouth 2 (two) times daily.    Yes [provider]  Peppermint Oil (IBGARD PO) Take by mouth daily at 2 am.   Yes [provider]  potassium chloride SA (KLOR-CON) 20 MEQ tablet Take 20 mEq by mouth daily.   Yes [provider]  pregabalin (LYRICA) 100 MG capsule Take 100 mg by mouth 3 (three) times daily. 08/07/20  Yes [provider]  Probiotic Product (PROBIOTIC PO) Take by mouth daily at 8 pm.   Yes [provider]  rOPINIRole (REQUIP) 1 MG tablet Take 1 mg by mouth 2 (two) times daily. 05/18/20  Yes [provider]  rosuvastatin (CRESTOR) 40 MG tablet TAKE 1 TABLET EVERY DAY (DOSE INCREASE) 11/17/20  Yes Wellington Hampshire, MD  spironolactone (ALDACTONE) 25 MG tablet Take 1 tablet (25 mg total) by mouth daily. 08/25/20 11/23/20 Yes Furth, Cadence H, PA-C  ticagrelor (BRILINTA) 90 MG TABS tablet Take 90 mg by mouth 2 (two) times daily.   Yes [provider]  torsemide (DEMADEX) 20 MG tablet Take 1 tablet (20 mg total) by mouth daily. But pt may take any extra 20 mg for shortness of breath/leg swelling 11/16/20 01/15/21 Yes Wyvonnia Dusky, MD  traMADol (ULTRAM) 50 MG tablet Take 50 mg by mouth every 6 (six) hours as needed. 11/07/20   Yes [provider]  VASCEPA 1 g capsule TAKE 2 CAPSULES TWICE DAILY 07/12/20  Yes Wellington Hampshire, MD  vitamin B-12 (CYANOCOBALAMIN) 1000 MCG tablet Take 1,000 mcg by mouth daily.   Yes [provider]   Review of Systems  Constitutional:  Positive for fatigue (easily). Negative for appetite change.  Eyes: Negative.   Respiratory:  Positive for cough (dry cough) and shortness of breath (easily).   Cardiovascular:  Positive for chest pain (at times). Negative for palpitations and leg swelling.  Gastrointestinal:  Negative for abdominal distention and abdominal pain.  Endocrine: Negative.   Genitourinary: Negative.   Musculoskeletal:  Positive for arthralgias (right leg).  Skin: Negative.   Allergic/Immunologic: Negative.   Neurological:  Positive for light-headedness (at times) and numbness (right foot).  Hematological:  Negative for adenopathy. Bruises/bleeds easily.  Psychiatric/Behavioral:  Positive for sleep disturbance (difficulty sleeping due to pain). Negative for dysphoric mood. The patient is not nervous/anxious.    Vitals:   11/20/20 1302  BP: 114/72  Pulse: 65  Resp: 18  SpO2: 98%  Weight: 201 lb (91.2 kg)  Height: '5\' 8"'$  (1.727 m)   Wt Readings from Last 3 Encounters:  11/20/20 201 lb (91.2 kg)  11/15/20 200 lb (90.7 kg)  11/14/20 200 lb 2 oz (90.8 kg)   Lab Results  Component Value Date   CREATININE 0.90 11/16/2020   CREATININE 1.07 11/14/2020   CREATININE 0.91 08/25/2020   Physical Exam Vitals and nursing note reviewed. Exam conducted with a chaperone present (wife).  Constitutional:      Appearance: Normal appearance.  HENT:     Head: Normocephalic and atraumatic.  Cardiovascular:     Rate and Rhythm: Normal rate and regular rhythm.  Pulmonary:     Effort: Pulmonary effort is normal. No respiratory distress.     Breath sounds: Wheezing (sporadic expiratory) present. No rales.  Abdominal:     General: There is distension.      Palpations: Abdomen is soft.  Musculoskeletal:        General: No tenderness.     Cervical back: Normal range of motion and neck supple.     Right lower leg: Edema (trace edema) present.     Left lower leg: Edema (trace edema/ + varicose veins) present.  Skin:    General: Skin is warm and dry.  Neurological:     General: No focal deficit present.     Mental Status: Willie Clark is alert and oriented to person, place, and time.  Psychiatric:        Mood and Affect: Mood normal.        Behavior: Behavior normal.        Thought Content: Thought content normal.    Assessment & Plan:  1: Chronic heart failure with reduced ejection fraction- - NYHA class III - euvolemic today - weighing daily; reminded to call for an overnight weight gain of > 2 pounds or a weekly weight gain of > 5 pounds - not adding salt and is reading food labels for sodium content - saw cardiology Rockey Situ) 11/14/20; returns 12/05/20 - on GDMT of carvedilol, farxiga, losartan & spironolactone - current BP may not allow for titration or for changing his losartan to entresto - has a treadmill at home but admits Willie Clark rarely gets on it; encouraged him to walk for 5 minutes a day and slowly work his way up from there - has participated in cardiac rehab numerous times - BNP 11/14/20 was 20.8  2: HTN- - BP looks good although on the low side; patient says it has been lower at home - follows with PCP at [facility] 11/16/20 reviewed and showed sodium 136, potassium 3.9, creatinine 0.9 & GFR > 60  3: Obstructive sleep apnea- - wearing his CPAP nightly   Medication list reviewed.   Return in 2 months or sooner for any questions/problems before then.

## 2020-11-20 ENCOUNTER — Encounter: Payer: Self-pay | Admitting: Family

## 2020-11-20 ENCOUNTER — Ambulatory Visit: Payer: Medicare HMO | Attending: Family | Admitting: Family

## 2020-11-20 ENCOUNTER — Other Ambulatory Visit: Payer: Self-pay

## 2020-11-20 VITALS — BP 114/72 | HR 65 | Resp 18 | Ht 68.0 in | Wt 201.0 lb

## 2020-11-20 DIAGNOSIS — Z882 Allergy status to sulfonamides status: Secondary | ICD-10-CM | POA: Insufficient documentation

## 2020-11-20 DIAGNOSIS — Z79899 Other long term (current) drug therapy: Secondary | ICD-10-CM | POA: Insufficient documentation

## 2020-11-20 DIAGNOSIS — Z885 Allergy status to narcotic agent status: Secondary | ICD-10-CM | POA: Diagnosis not present

## 2020-11-20 DIAGNOSIS — G4733 Obstructive sleep apnea (adult) (pediatric): Secondary | ICD-10-CM

## 2020-11-20 DIAGNOSIS — I10 Essential (primary) hypertension: Secondary | ICD-10-CM

## 2020-11-20 DIAGNOSIS — I5042 Chronic combined systolic (congestive) and diastolic (congestive) heart failure: Secondary | ICD-10-CM

## 2020-11-20 DIAGNOSIS — Z8673 Personal history of transient ischemic attack (TIA), and cerebral infarction without residual deficits: Secondary | ICD-10-CM | POA: Insufficient documentation

## 2020-11-20 DIAGNOSIS — Z7982 Long term (current) use of aspirin: Secondary | ICD-10-CM | POA: Diagnosis not present

## 2020-11-20 DIAGNOSIS — Z7984 Long term (current) use of oral hypoglycemic drugs: Secondary | ICD-10-CM | POA: Diagnosis not present

## 2020-11-20 DIAGNOSIS — I5022 Chronic systolic (congestive) heart failure: Secondary | ICD-10-CM | POA: Insufficient documentation

## 2020-11-20 DIAGNOSIS — Z8249 Family history of ischemic heart disease and other diseases of the circulatory system: Secondary | ICD-10-CM | POA: Insufficient documentation

## 2020-11-20 DIAGNOSIS — I110 Hypertensive heart disease with heart failure: Secondary | ICD-10-CM | POA: Diagnosis present

## 2020-11-20 DIAGNOSIS — Z86711 Personal history of pulmonary embolism: Secondary | ICD-10-CM | POA: Insufficient documentation

## 2020-11-20 DIAGNOSIS — Z87891 Personal history of nicotine dependence: Secondary | ICD-10-CM | POA: Diagnosis not present

## 2020-11-20 NOTE — Patient Instructions (Signed)
Continue weighing daily and call for an overnight weight gain of > 2 pounds or a weekly weight gain of >5 pounds. 

## 2020-11-21 LAB — CYTOCHROME P450 2C19

## 2020-11-22 ENCOUNTER — Ambulatory Visit: Payer: Medicare HMO | Admitting: Family

## 2020-11-23 ENCOUNTER — Telehealth: Payer: Self-pay | Admitting: *Deleted

## 2020-11-23 DIAGNOSIS — Z79899 Other long term (current) drug therapy: Secondary | ICD-10-CM

## 2020-11-23 DIAGNOSIS — I5042 Chronic combined systolic (congestive) and diastolic (congestive) heart failure: Secondary | ICD-10-CM

## 2020-11-23 MED ORDER — TORSEMIDE 20 MG PO TABS
40.0000 mg | ORAL_TABLET | Freq: Every day | ORAL | Status: DC
Start: 2020-11-23 — End: 2020-12-21

## 2020-11-23 NOTE — Telephone Encounter (Signed)
Spoke to pt. Notified of Dr. Tyrell Antonio recc.  Pt voiced understanding. He will incr Torsemide to '40mg'$  daily and [facility]  Pt will take 2 tablets of current dose for total of '40mg'$ .  Lab orders placed.  Pt has no further questions at this time.

## 2020-11-23 NOTE — Telephone Encounter (Signed)
Increase torsemide to 40 mg once daily and check basic metabolic profile in 1 week.

## 2020-11-23 NOTE — Telephone Encounter (Signed)
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  For now, Willie Clark should continue his current regimen of aspirin and ticagrelor and follow-up with Dr. Fletcher Anon for ongoing management as scheduled.  I will forward the results to Dr. Fletcher Anon for his review as well.

## 2020-11-23 NOTE — Telephone Encounter (Signed)
Spoke to pt. Notified of lab results and Dr. Darnelle Bos recc.  Pt voiced understanding. He will continue his current regimen of aspirin and ticagrelor and follow up with Dr. Fletcher Anon as scheduled 12/05/20.   Pt proceeded to report wt gain ~5lbs in 1 week.  Last Saturday 7/30 wt 192.6lbs. This morning wt 198.2lbs.  Continues to have dyspnea w/ minimal exertion.  Minimal swelling reported in both feet and ankles.  Denies chest pain.  BP this AM 122/63.   Pt has taken AM dose of Torsemide '20mg'$ .  Advised pt take an extra Torsemide '20mg'$  now per Rx.  Advised pt let us know of any change in s/s.  Otherwise will forward to Dr. Fletcher Anon for any further recc.  Pt voiced understanding and has no further questions at this time.

## 2020-11-23 NOTE — Telephone Encounter (Signed)
Patient calling for results    And    Pt c/o swelling: STAT is pt has developed SOB within 24 hours  If swelling, where is the swelling located? Hands feel tight and ankles are not that bad   How much weight have you gained and in what time span? 5 pounds since Sunday   Have you gained 3 pounds in a day or 5 pounds in a week? 5 pounds since Sunday   Do you have a log of your daily weights (if so, list)? 193.2 Sunday 198.2 today   Are you currently taking a fluid pill? Yes   Are you currently SOB? Yes   Have you traveled recently? No

## 2020-11-23 NOTE — Telephone Encounter (Signed)
Attempted to call pt. No answer. Lmtcb.  

## 2020-11-30 ENCOUNTER — Other Ambulatory Visit
Admission: RE | Admit: 2020-11-30 | Discharge: 2020-11-30 | Disposition: A | Discharge status: Home / Self Care | Payer: Medicare HMO | Source: Ambulatory Visit | Attending: Cardiovascular Disease | Admitting: Cardiovascular Disease

## 2020-11-30 DIAGNOSIS — Z79899 Other long term (current) drug therapy: Secondary | ICD-10-CM | POA: Diagnosis present

## 2020-11-30 DIAGNOSIS — I5042 Chronic combined systolic (congestive) and diastolic (congestive) heart failure: Secondary | ICD-10-CM | POA: Insufficient documentation

## 2020-11-30 LAB — BASIC METABOLIC PANEL
Anion gap: 13 (ref 5–15)
BUN: 14 mg/dL (ref 8–23)
CO2: 27 mmol/L (ref 22–32)
Calcium: 9.3 mg/dL (ref 8.9–10.3)
Chloride: 96 mmol/L — ABNORMAL LOW (ref 98–111)
Creatinine, Ser: 0.99 mg/dL (ref 0.61–1.24)
GFR, Estimated: >60 mL/min (ref >60)
Glucose, Bld: 138 mg/dL — ABNORMAL HIGH (ref 70–99)
Potassium: 3.7 mmol/L (ref 3.5–5.1)
Sodium: 136 mmol/L (ref 135–145)

## 2020-12-05 ENCOUNTER — Ambulatory Visit: Payer: Medicare HMO | Admitting: Cardiovascular Disease

## 2020-12-05 ENCOUNTER — Other Ambulatory Visit: Payer: Self-pay

## 2020-12-05 ENCOUNTER — Encounter: Payer: Self-pay | Admitting: Cardiovascular Disease

## 2020-12-05 VITALS — BP 128/78 | HR 64 | Ht 68.5 in | Wt 200.0 lb

## 2020-12-05 DIAGNOSIS — I5022 Chronic systolic (congestive) heart failure: Secondary | ICD-10-CM

## 2020-12-05 DIAGNOSIS — E785 Hyperlipidemia, unspecified: Secondary | ICD-10-CM

## 2020-12-05 DIAGNOSIS — I25118 Atherosclerotic heart disease of native coronary artery with other forms of angina pectoris: Secondary | ICD-10-CM

## 2020-12-05 DIAGNOSIS — I714 Abdominal aortic aneurysm, without rupture, unspecified: Secondary | ICD-10-CM

## 2020-12-05 DIAGNOSIS — I779 Disorder of arteries and arterioles, unspecified: Secondary | ICD-10-CM

## 2020-12-05 DIAGNOSIS — I10 Essential (primary) hypertension: Secondary | ICD-10-CM

## 2020-12-05 MED ORDER — CLOPIDOGREL BISULFATE 75 MG PO TABS: ORAL_TABLET | ORAL | 2 refills | Status: DC

## 2020-12-05 MED ORDER — POTASSIUM CHLORIDE CRYS ER 20 MEQ PO TBCR: 20.0000 meq | EXTENDED_RELEASE_TABLET | Freq: Every day | ORAL | 2 refills | Status: DC

## 2020-12-05 NOTE — Patient Instructions (Signed)
Medication Instructions:  Your physician has recommended you make the following change in your medication:   -Take your last dose of Brilinta Thursday evening 12/07/20  -Starting Friday 12/08/20 Take (4 tablets) 300 mg of Plavix (clopidogrel ),  Then starting the next day take (1) tablet 75 mg daily.     *If you need a refill on your cardiac medications before your next appointment, please call your pharmacy*   Lab Work: None ordered If you have labs (blood work) drawn today and your tests are completely normal, you will receive your results only by: [place] (if you have MyChart) OR A paper copy in the mail If you have any lab test that is abnormal or we need to change your treatment, we will call you to review the results.   Testing/Procedures: None ordered   Follow-Up: At [facility], you and your health needs are our priority.  As part of our continuing mission to provide you with exceptional heart care, we have created designated Provider Care Teams.  These Care Teams include your primary Cardiologist (physician) and Advanced Practice Providers (APPs -  Physician Assistants and Nurse Practitioners) who all work together to provide you with the care you need, when you need it.  We recommend signing up for the patient portal called "MyChart".  Sign up information is provided on this After Visit Summary.  MyChart is used to connect with patients for Virtual Visits (Telemedicine).  Patients are able to view lab/test results, encounter notes, upcoming appointments, etc.  Non-urgent messages can be sent to your provider as well.   To learn more about what you can do with MyChart, go to [email or web address].    Your next appointment:   3 month(s)  The format for your next appointment:   In Person  Provider:   You may see [name], MD or one of the following Advanced Practice Providers on your designated Care Team:   Murray Hodgkins, NP Christell Faith,  PA-C Marrianne Mood, PA-C Cadence Kathlen Mody, [place]   Other Instructions N/A

## 2020-12-05 NOTE — Progress Notes (Signed)
Cardiology Office Note   Date:  12/05/2020   ID:  Willie Clark, DOB [birth year], MRN [id number]  PCP:  [name], MD (Inactive)  Cardiologist:   [name], MD   Chief Complaint  Patient presents with   Other    2 wk f/u c/o feeling short winded and weakness. Meds reviewed verbally with pt.      History of Present Illness: Willie Clark is a 70 y.o. male who presents for regarding coronary artery disease and chronic systolic heart failure.  He has known history of coronary artery disease. He is status post coronary artery bypass graft surgery in 1993 and redo in 2007.   He suffers from fibromyalgia and chronic pain.  He had GI symptoms in 2015 due to documented gastric ulcer. He had neck surgery in [id number] without complications. He has known history of chronic neck pain with 2 previous cervical spine surgeries.    He was hospitalized in August of 2021with chest pain.  Echocardiogram showed an EF of 40 to 45%.  Cardiac catheterization showed severe underlying three-vessel coronary arteries with patent grafts and no significant change since 2017.  The patient antihypertensive medications were switched to losartan and carvedilol.  Rosuvastatin was increased to 40 mg daily. Losartan was subsequently switched to [facility].  However, the patient had allergic reaction with rash and itching that had to be treated in the emergency room.  Spironolactone was subsequently added.  He was seen on March in the office with symptoms of unstable angina.  His EKG was worrisome for inferior ST elevation myocardial infarction and thus he was transferred emergently to the Cath Lab where he underwent cardiac catheterization which showed severe native three-vessel coronary artery disease with patent left circumflex stent with mild to moderate in-stent restenosis, widely patent LIMA to LAD, patent SVG to OM 2 with 60 to 70% proximal graft stenosis and subtotally occluded SVG to RCA with long segment of  proximal graft disease and 99% stenosis at the distal anastomosis.  He underwent successful PCI to SVG to RCA with overlapping resolute Onyx drug-eluting stents. Echocardiogram during that admission showed an EF of 35 to 40%.    He was hospitalized last month with increased chest pain and shortness of breath.  Troponin was normal.  Cardiac catheterization was performed which showed significant underlying three-vessel coronary artery disease with patent left circumflex stent with moderate in-stent restenosis, patent LIMA to LAD, patent SVG to right PDA with patent stents and patent SVG to OM with sequential 75% and 60% ostial and mid graft stenoses.  Right heart catheterization showed mildly elevated filling pressures with mildly reduced cardiac output.  PCI with 2 nonoverlapping drug-eluting stent placement was done to SVG to OM.  The patient reports resolution of chest pain but he continues to struggle with shortness of breath that can happen both at rest and with exertion.  He takes torsemide 40 mg once daily.   Past Medical History:  Diagnosis Date   Anxiety    Arthritis    Barrett's esophagus    Bowel obstruction ([place]) 02/2009   small bowel   CHF (congestive heart failure) (HCC)    Chronic chest pain    Coronary artery disease    a. 1993 CABG; b. 2007 Redo CABG; c. 09/2015 Cath: LM mild dzs, LAD 100ost, RI mild dzs, LCX 60p, OM1 100, RCA 100ost, VG->OM2 min irregs, LIMA->LAD nl, VG->RPDA 30ost, 20d, nl EF->Med rx.   Depression    Diverticulitis  Diverticulosis    Erectile dysfunction    Fibromyalgia    Gastric ulcer    Gastritis    GERD (gastroesophageal reflux disease)    Headache    Hemorrhoids    HFrEF (heart failure with reduced ejection fraction) ([place])    a. 09/2016 Echo: EF 55-60%, no rwma, triv AI, mild MR, mildly dil LA. -> as of April 2020: EF 40 to 45%.  Septal dyssynergy/hypokinesis due to postop state but otherwise unable to assess wall motion [name] due to poor study.    History of myocardial infarct at age less than 11 years    Hyperlipidemia    Hypertension    Kidney stones    going to [facility]  June 26 to see kidney specialist   Lightheadedness    Mass on back    Nephrolithiasis    Orthostatic hypotension    a. Improved after discontinuation of metoprolol.   OSA (obstructive sleep apnea)    a. On CPAP.   Pulmonary embolism ([place]) 01/2006   Post-op/treated   Skin lesions, generalized    Stroke [facility]) 1993   right side weakness no blood thinners   on Aspirin   Syncope and collapse    Urticaria     Past Surgical History:  Procedure Laterality Date   BACK SURGERY     bowel obstruction  01/2009   CARDIAC CATHETERIZATION  11/2006   patent grafts. Significant OM3 disease and occluded diagonals.   CARDIAC CATHETERIZATION  06/2010   patent grafts. significant ISR in proximal LCX but OM2 is bypassed and gives retrograde flow to OM3.    CARDIAC CATHETERIZATION  06/2013   [facility]: Patent grafts with 60% proximal in-stent restenosis in the left circumflex with FFR of 0.85   CARDIAC CATHETERIZATION Left 09/25/2015   Procedure: Left Heart Cath and Cors/Grafts Angiography;  Surgeon: Wellington Hampshire, MD;  Location: [place] [facility];  Service: Cardiovascular;  Laterality: Left;   CERVICAL FUSION     CHOLECYSTECTOMY     COLON SURGERY  01/2009   BOWEL RESECTION DUE TO SMALL BOWEL OBSTRUCTION   COLONOSCOPY     COLONOSCOPY WITH PROPOFOL N/A 10/28/2017   Procedure: COLONOSCOPY WITH PROPOFOL;  Surgeon: Lollie Sails, MD;  Location: [facility] ENDOSCOPY;  Service: Endoscopy;  Laterality: N/A;   CORONARY ANGIOPLASTY  2006   CORONARY ARTERY BYPASS GRAFT  1993/01/2006   redo at [facility]. LIMA to LAD, SVG to OM2 and SVG to RPDA   CORONARY STENT INTERVENTION N/A 06/20/2020   Procedure: CORONARY STENT INTERVENTION;  Surgeon: Nelva Bush, MD;  Location: [place] [facility];  Service: Cardiovascular;  Laterality: N/A;   CORONARY STENT INTERVENTION N/A 11/15/2020    Procedure: CORONARY STENT INTERVENTION;  Surgeon: Nelva Bush, MD;  Location: [place] [facility];  Service: Cardiovascular;  Laterality: N/A;   CYSTOSCOPY WITH STENT PLACEMENT Bilateral 09/24/2015   Procedure: CYSTOSCOPY WITH BILATERAL RETROGRADES, BILATERAL STENT PLACEMENT;  Surgeon: Festus Aloe, MD;  Location: [facility] ORS;  Service: Urology;  Laterality: Bilateral;   ESOPHAGOGASTRODUODENOSCOPY N/A 10/25/2014   Procedure: ESOPHAGOGASTRODUODENOSCOPY (EGD);  Surgeon: Lollie Sails, MD;  Location: [facility] ENDOSCOPY;  Service: Endoscopy;  Laterality: N/A;   ESOPHAGOGASTRODUODENOSCOPY (EGD) WITH PROPOFOL N/A 02/03/2017   Procedure: ESOPHAGOGASTRODUODENOSCOPY (EGD) WITH PROPOFOL;  Surgeon: Lollie Sails, MD;  Location: [facility] ENDOSCOPY;  Service: Endoscopy;  Laterality: N/A;   ESOPHAGOGASTRODUODENOSCOPY (EGD) WITH PROPOFOL N/A 04/18/2017   Procedure: ESOPHAGOGASTRODUODENOSCOPY (EGD) WITH PROPOFOL;  Surgeon: Lollie Sails, MD;  Location: [facility] ENDOSCOPY;  Service: Endoscopy;  Laterality: N/A;   EYE SURGERY Bilateral    Cataract Extraction with IOL   HERNIA REPAIR     INGUINAL HERNIA REPAIR Bilateral 01/23/2016   Procedure: HERNIA REPAIR INGUINAL ADULT BILATERAL;  Surgeon: Leonie Green, MD;  Location: [facility] ORS;  Service: General;  Laterality: Bilateral;   LEFT HEART CATH AND CORS/GRAFTS ANGIOGRAPHY N/A 12/13/2019   Procedure: LEFT HEART CATH AND CORS/GRAFTS ANGIOGRAPHY;  Surgeon: Wellington Hampshire, MD;  Location: [place] [facility];  Service: Cardiovascular;  Laterality: N/A;   LEFT HEART CATH AND CORS/GRAFTS ANGIOGRAPHY N/A 06/20/2020   Procedure: LEFT HEART CATH AND CORS/GRAFTS ANGIOGRAPHY;  Surgeon: Nelva Bush, MD;  Location: [place] [facility];  Service: Cardiovascular;  Laterality: N/A;   LUMBAR LAMINECTOMY/DECOMPRESSION MICRODISCECTOMY Left 10/01/2016   Procedure: Left Lumbar four-five Laminotomy for resection of synovial cyst;  Surgeon: Erline Levine, MD;  Location: [place];   Service: Neurosurgery;  Laterality: Left;  left   NECK SURGERY  06/2009   RIGHT/LEFT HEART CATH AND CORONARY/GRAFT ANGIOGRAPHY N/A 11/15/2020   Procedure: RIGHT/LEFT HEART CATH AND CORONARY/GRAFT ANGIOGRAPHY;  Surgeon: Nelva Bush, MD;  Location: [place] [facility];  Service: Cardiovascular;  Laterality: N/A;   SHOULDER SURGERY  2010     Current Outpatient Medications  Medication Sig Dispense Refill   albuterol (PROVENTIL HFA;VENTOLIN HFA) 108 (90 Base) MCG/ACT inhaler Inhale into the lungs every 4 (four) hours as needed for wheezing or shortness of breath.     aspirin EC 81 MG EC tablet Take 1 tablet (81 mg total) by mouth daily. Swallow whole. 30 tablet 11   carvedilol (COREG) 3.125 MG tablet Take 1 tablet (3.125 mg total) by mouth 2 (two) times daily with a meal. 180 tablet 0   citalopram (CELEXA) 20 MG tablet Take 20 mg by mouth daily.      dapagliflozin propanediol (FARXIGA) 10 MG TABS tablet Take 1 tablet (10 mg total) by mouth daily before breakfast. 90 tablet 3   EPINEPHrine 0.3 mg/0.3 mL IJ SOAJ injection Inject 0.3 mg into the muscle as needed.      fexofenadine (ALLEGRA) 180 MG tablet Take 180 mg by mouth daily.     finasteride (PROSCAR) 5 MG tablet Take 5 mg by mouth daily.     gabapentin (NEURONTIN) 800 MG tablet Take 1 tablet by mouth 3 (three) times daily.     isosorbide mononitrate (IMDUR) 60 MG 24 hr tablet Take 1 tablet (60 mg total) by mouth daily. 90 tablet 3   losartan (COZAAR) 25 MG tablet TAKE 1 TABLET EVERY DAY 90 tablet 0   Mesalamine 800 MG TBEC Take 1,600 mg by mouth in the morning, at noon, and at bedtime.     Multiple Vitamins-Minerals (HAIR SKIN AND NAILS FORMULA PO) Take 1 capsule by mouth daily.     nitroGLYCERIN (NITROSTAT) 0.4 MG SL tablet Place 1 tablet (0.4 mg total) under the tongue every 5 (five) minutes as needed for chest pain. 90 tablet 1   ondansetron (ZOFRAN) 4 MG tablet Take 4 mg by mouth every 8 (eight) hours as needed for nausea or vomiting.      pantoprazole (PROTONIX) 40 MG tablet Take 40 mg by mouth 2 (two) times daily.      Peppermint Oil (IBGARD PO) Take by mouth daily at 2 am.     potassium chloride SA (KLOR-CON) 20 MEQ tablet Take 20 mEq by mouth daily.     pregabalin (LYRICA) 100 MG capsule Take 100 mg by mouth 3 (three) times  daily.     Probiotic Product (PROBIOTIC PO) Take by mouth daily at 8 pm.     rOPINIRole (REQUIP) 1 MG tablet Take 1 mg by mouth 2 (two) times daily.     rosuvastatin (CRESTOR) 40 MG tablet TAKE 1 TABLET EVERY DAY (DOSE INCREASE) 90 tablet 1   spironolactone (ALDACTONE) 25 MG tablet Take 1 tablet (25 mg total) by mouth daily. 90 tablet 0   ticagrelor (BRILINTA) 90 MG TABS tablet Take 90 mg by mouth 2 (two) times daily.     torsemide (DEMADEX) 20 MG tablet Take 2 tablets (40 mg total) by mouth daily.     traMADol (ULTRAM) 50 MG tablet Take 50 mg by mouth every 6 (six) hours as needed.     VASCEPA 1 g capsule TAKE 2 CAPSULES TWICE DAILY 360 capsule 1   vitamin B-12 (CYANOCOBALAMIN) 1000 MCG tablet Take 1,000 mcg by mouth daily.     No current facility-administered medications for this visit.    Allergies:   Amitriptyline, Cyclobenzaprine, Dicyclomine, Sacubitril-valsartan, Amitriptyline hcl, Bacon flavor, Hydrocodone, Fish oil, Lidocaine, Omega-3 fatty acids-vitamin e, Other, Oxycodone-acetaminophen, and Sulfa antibiotics    Social History:  The patient  reports that he quit smoking about 29 years ago. His smoking use included cigarettes. He smoked an average of 2.5 packs per day. He has never used smokeless tobacco. He reports that he does not currently use alcohol. He reports that he does not use drugs.   Family History:  The patient's family history includes Heart attack in his father; Heart disease (age of onset: 59) in his father.    ROS:  Please see the history of present illness.   Otherwise, review of systems are positive for none.   All other systems are reviewed and negative.    PHYSICAL  EXAM: VS:  BP 128/78 (BP Location: Left Arm, Patient Position: Sitting, Cuff Size: Normal)   Pulse 64   Ht 5' 8.5" (1.74 m)   Wt 200 lb (90.7 kg)   SpO2 98%   BMI 29.97 kg/m  , BMI Body mass index is 29.97 kg/m. GEN: Well nourished, well developed, in no acute distress  HEENT: normal  Neck: no JVD, carotid bruits, or masses Cardiac: RRR; no murmurs, rubs, or gallops,no edema  Respiratory:  clear to auscultation bilaterally, normal work of breathing GI: soft, nontender, nondistended, + BS MS: no deformity or atrophy  Skin: warm and dry, no rash Neuro:  Strength and sensation are intact Psych: euthymic mood, full affect No radial hematoma  EKG:  EKG is  ordered today. Sinus rhythm with PVCs   Recent Labs: 12/12/2019: Magnesium 2.7 08/07/2020: NT-Pro BNP 242 11/14/2020: ALT 23; B Natriuretic Peptide 20.8; TSH 2.375 11/16/2020: Hemoglobin 13.3; Platelets 164 11/30/2020: BUN 14; Creatinine, Ser 0.99; Potassium 3.7; Sodium 136    Lipid Panel    Component Value Date/Time   CHOL 165 02/07/2020 0658   TRIG 317 (H) 02/07/2020 0658   HDL 41 02/07/2020 0658   CHOLHDL 4.0 02/07/2020 0658   VLDL 63 (H) 02/07/2020 0658   LDLCALC 61 02/07/2020 0658      Wt Readings from Last 3 Encounters:  12/05/20 200 lb (90.7 kg)  11/20/20 201 lb (91.2 kg)  11/15/20 200 lb (90.7 kg)         ASSESSMENT AND PLAN:  1.  Coronary artery disease involving native coronary arteries with other forms of angina: Since most recent stenting, he reports resolution of chest pain but she continues to struggle with  shortness of breath both at rest and with exertion.  Some of his symptoms might be related to ticagrelor and I think it is worth trying a different antiplatelet medication.  I elected to switch him to clopidogrel 75 mg once daily with 300 mg loading dose to be started on Friday.   He has dental cleaning scheduled for tomorrow and that should be no contraindication from a cardiac standpoint as long as  his antiplatelet medications are not stopped. He is going to start cardiac rehab next week.  2.  Chronic systolic heart failure: Repeat echocardiogram during recent hospitalization showed an EF of 40 to 45%.  Filling pressures were only mildly elevated and currently appears to be euvolemic on current dose of torsemide 40 mg once daily.   His blood pressure is on the low side and does not allow up titration of medications. Continue small dose carvedilol, losartan, spironolactone and Farxiga.  3. Essential hypertension: Blood pressure is controlled.   4. Hyperlipidemia: Continue high-dose rosuvastatin and Vascepa.  Most recent lipid profile showed an LDL of 61 and triglyceride of 317.   5. Mild carotid disease: Previous carotid Doppler showed less than 40% stenosis bilaterally. No need to repeat.  6.  Small abdominal aortic aneurysm: This was noted on ultrasound yesterday measuring 3 cm.  Recommend repeat ultrasound in 2 years .    Disposition: Follow-up in 3 months.   Signed,  [name], MD  12/05/2020 4:44 PM    [facility]

## 2020-12-06 ENCOUNTER — Telehealth: Payer: Self-pay | Admitting: Cardiovascular Disease

## 2020-12-06 NOTE — Telephone Encounter (Signed)
Attempted to schedule 3 m fu per checkout 12/05/20.   LMOV to call office.

## 2020-12-11 ENCOUNTER — Telehealth: Payer: Self-pay | Admitting: Cardiovascular Disease

## 2020-12-11 NOTE — Telephone Encounter (Signed)
   Name: Willie Clark  DOB: 1950/04/25  MRN: [id number]   Primary Cardiologist: [name], MD  Chart reviewed as part of pre-operative protocol coverage. Patient has complex history of CAD s/p PCIs and CHF. He had PCI as recently as 11/15/20 at which time it was recommended to continue dual antiplatelet therapy for at least 12 months, ideally longer. This was originally with ASA + Brilinta but Brilinta was changed to Plavix earlier this month.  We will need to decline his clearance for this procedure but for planning purposes so the patient is aware, I will route to Dr. Fletcher Anon for input on the earliest possible timeframe a spinal injection / holding Plavix may be considered in the future. Dr. Fletcher Anon - Please route response to [facility] PREOP (the pre-op pool). Thank you.  When we hear back, will route to surgeon and callback to let pt know. Patient also has appt scheduled with Dr. Fletcher Anon in November 2022.  [name], PA-C 12/11/2020, 3:42 PM

## 2020-12-11 NOTE — Telephone Encounter (Signed)
   [facility] Pre-operative Risk Assessment    Patient Name: Willie Clark  DOB: 03-29-1951 MRN: [id number]  HEARTCARE STAFF:  - IMPORTANT!!!!!! Under Visit Info/Reason for Call, type in Other and utilize the format Clearance MM/DD/YY or Clearance TBD. Do not use dashes or single digits. - Please review there is not already an duplicate clearance open for this procedure. - If request is for dental extraction, please clarify the # of teeth to be extracted. - If the patient is currently at the dentist's office, call Pre-Op Callback Staff (MA/nurse) to input urgent request.  - If the patient is not currently in the dentist office, please route to the Pre-Op pool.  Request for surgical clearance:  What type of surgery is being performed? Spinal injection  When is this surgery scheduled? TBD  What type of clearance is required (medical clearance vs. Pharmacy clearance to hold med vs. Both)? both  Are there any medications that need to be held prior to surgery and how long? Plavix for 7 days prior  Practice name and name of physician performing surgery? [facility] - Dr Lenord Carbo  What is the office phone number? [phone]   7.   What is the office fax number? [phone]  8.   Anesthesia type (None, local, MAC, general) ? Not listed    Ace Gins 12/11/2020, 2:03 PM  _________________________________________________________________   (provider comments below)

## 2020-12-12 ENCOUNTER — Encounter: Payer: Medicare HMO | Attending: Internal Medicine | Admitting: *Deleted

## 2020-12-12 ENCOUNTER — Other Ambulatory Visit: Payer: Self-pay

## 2020-12-12 VITALS — Ht 70.1 in | Wt 208.0 lb

## 2020-12-12 DIAGNOSIS — Z955 Presence of coronary angioplasty implant and graft: Secondary | ICD-10-CM | POA: Diagnosis not present

## 2020-12-12 NOTE — Patient Instructions (Signed)
Patient Instructions  Patient Details  Name: Willie Clark MRN: [id number] Date of Birth: Apr 22, 1951 Referring Provider:  Nelva Bush, MD  Below are your personal goals for exercise, nutrition, and risk factors. Our goal is to help you stay on track towards obtaining and maintaining these goals. We will be discussing your progress on these goals with you throughout the program.  Initial Exercise Prescription:  Initial Exercise Prescription - 12/12/20 0800       Date of Initial Exercise RX and Referring Provider   Date 12/12/20    Referring Provider End, Harrell Gave MD      Treadmill   MPH 2.3    Grade 0.5    Minutes 15    METs 2.92      Recumbant Bike   Level 2    RPM 50    Watts 25    Minutes 15    METs 2.5      REL-XR   Level 2    Speed 50    Minutes 15    METs 2.5      T5 Nustep   Level 3    SPM 80    Minutes 15    METs 2.5      Prescription Details   Frequency (times per week) 3    Duration Progress to 30 minutes of continuous aerobic without signs/symptoms of physical distress      Intensity   THRR 40-80% of Max Heartrate 99-134    Ratings of Perceived Exertion 11-13    Perceived Dyspnea 0-4      Progression   Progression Continue to progress workloads to maintain intensity without signs/symptoms of physical distress.      Resistance Training   Training Prescription Yes    Weight 4 lb    Reps 10-15             Exercise Goals: Frequency: Be able to perform aerobic exercise two to three times per week in program working toward 2-5 days per week of home exercise.  Intensity: Work with a perceived exertion of 11 (fairly light) - 15 (hard) while following your exercise prescription.  We will make changes to your prescription with you as you progress through the program.   Duration: Be able to do 30 to 45 minutes of continuous aerobic exercise in addition to a 5 minute warm-up and a 5 minute cool-down routine.   Nutrition Goals: Your  personal nutrition goals will be established when you do your nutrition analysis with the dietician.  The following are general nutrition guidelines to follow: Cholesterol < '200mg'$ /day Sodium < '1500mg'$ /day Fiber: Men over 50 yrs - 30 grams per day  Personal Goals:  Personal Goals and Risk Factors at Admission - 12/12/20 0754       Core Components/Risk Factors/Patient Goals on Admission    Weight Management Yes;Weight Loss    Intervention Weight Management: Develop a combined nutrition and exercise program designed to reach desired caloric intake, while maintaining appropriate intake of nutrient and fiber, sodium and fats, and appropriate energy expenditure required for the weight goal.;Weight Management: Provide education and appropriate resources to help participant work on and attain dietary goals.;Weight Management/Obesity: Establish reasonable short term and long term weight goals.    Admit Weight 208 lb (94.3 kg)    Goal Weight: Short Term 200 lb (90.7 kg)    Goal Weight: Long Term 190 lb (86.2 kg)    Expected Outcomes Short Term: Continue to assess and modify interventions until short term  weight is achieved;Long Term: Adherence to nutrition and physical activity/exercise program aimed toward attainment of established weight goal;Weight Loss: Understanding of general recommendations for a balanced deficit meal plan, which promotes 1-2 lb weight loss per week and includes a negative energy balance of [phone] kcal/d;Understanding recommendations for meals to include 15-35% energy as protein, 25-35% energy from fat, 35-60% energy from carbohydrates, less than '200mg'$  of dietary cholesterol, 20-35 gm of total fiber daily;Understanding of distribution of calorie intake throughout the day with the consumption of 4-5 meals/snacks    Heart Failure Yes    Intervention Provide a combined exercise and nutrition program that is supplemented with education, support and counseling about heart failure.  Directed toward relieving symptoms such as shortness of breath, decreased exercise tolerance, and extremity edema.    Expected Outcomes Improve functional capacity of life;Short term: Attendance in program 2-3 days a week with increased exercise capacity. Reported lower sodium intake. Reported increased fruit and vegetable intake. Reports medication compliance.;Short term: Daily weights obtained and reported for increase. Utilizing diuretic protocols set by physician.;Long term: Adoption of self-care skills and reduction of barriers for early signs and symptoms recognition and intervention leading to self-care maintenance.    Hypertension Yes    Intervention Provide education on lifestyle modifcations including regular physical activity/exercise, weight management, moderate sodium restriction and increased consumption of fresh fruit, vegetables, and low fat dairy, alcohol moderation, and smoking cessation.;Monitor prescription use compliance.    Expected Outcomes Short Term: Continued assessment and intervention until BP is < 140/58m HG in hypertensive participants. < 130/875mHG in hypertensive participants with diabetes, heart failure or chronic kidney disease.;Long Term: Maintenance of blood pressure at goal levels.    Lipids Yes    Intervention Provide education and support for participant on nutrition & aerobic/resistive exercise along with prescribed medications to achieve LDL '70mg'$ , HDL >'40mg'$ .    Expected Outcomes Short Term: Participant states understanding of desired cholesterol values and is compliant with medications prescribed. Participant is following exercise prescription and nutrition guidelines.;Long Term: Cholesterol controlled with medications as prescribed, with individualized exercise RX and with personalized nutrition plan. Value goals: LDL < '70mg'$ , HDL > 40 mg.             Tobacco Use Initial Evaluation: Social History   Tobacco Use  Smoking Status Former   Packs/day: 2.50    Types: Cigarettes   Quit date: 08/21/1991   Years since quitting: 29.3  Smokeless Tobacco Never    Exercise Goals and Review:  Exercise Goals     Row Name 12/12/20 0756             Exercise Goals   Increase Physical Activity Yes       Intervention Provide advice, education, support and counseling about physical activity/exercise needs.;Develop an individualized exercise prescription for aerobic and resistive training based on initial evaluation findings, risk stratification, comorbidities and participant's personal goals.       Expected Outcomes Short Term: Attend rehab on a regular basis to increase amount of physical activity.;Long Term: Add in home exercise to make exercise part of routine and to increase amount of physical activity.;Long Term: Exercising regularly at least 3-5 days a week.       Increase Strength and Stamina Yes       Intervention Provide advice, education, support and counseling about physical activity/exercise needs.;Develop an individualized exercise prescription for aerobic and resistive training based on initial evaluation findings, risk stratification, comorbidities and participant's personal goals.  Expected Outcomes Short Term: Increase workloads from initial exercise prescription for resistance, speed, and METs.;Short Term: Perform resistance training exercises routinely during rehab and add in resistance training at home;Long Term: Improve cardiorespiratory fitness, muscular endurance and strength as measured by increased METs and functional capacity (6MWT)       Able to understand and use rate of perceived exertion (RPE) scale Yes       Intervention Provide education and explanation on how to use RPE scale       Expected Outcomes Short Term: Able to use RPE daily in rehab to express subjective intensity level;Long Term:  Able to use RPE to guide intensity level when exercising independently       Able to understand and use Dyspnea scale Yes        Intervention Provide education and explanation on how to use Dyspnea scale       Expected Outcomes Short Term: Able to use Dyspnea scale daily in rehab to express subjective sense of shortness of breath during exertion;Long Term: Able to use Dyspnea scale to guide intensity level when exercising independently       Knowledge and understanding of Target Heart Rate Range (THRR) Yes       Intervention Provide education and explanation of THRR including how the numbers were predicted and where they are located for reference       Expected Outcomes Short Term: Able to state/look up THRR;Long Term: Able to use THRR to govern intensity when exercising independently;Short Term: Able to use daily as guideline for intensity in rehab       Able to check pulse independently Yes       Intervention Provide education and demonstration on how to check pulse in carotid and radial arteries.;Review the importance of being able to check your own pulse for safety during independent exercise       Expected Outcomes Short Term: Able to explain why pulse checking is important during independent exercise;Long Term: Able to check pulse independently and accurately       Understanding of Exercise Prescription Yes       Intervention Provide education, explanation, and written materials on patient's individual exercise prescription       Expected Outcomes Short Term: Able to explain program exercise prescription;Long Term: Able to explain home exercise prescription to exercise independently                Copy of goals given to participant.

## 2020-12-12 NOTE — Progress Notes (Signed)
Cardiac Individual Treatment Plan  Patient Details  Name: Willie Clark MRN: [id number] Date of Birth: 1951/03/24 Referring Provider:   Flowsheet Row Cardiac Rehab from 12/12/2020 in [facility] Cardiac and Pulmonary Rehab  Referring Provider End, Harrell Gave MD       Initial Encounter Date:  Flowsheet Row Cardiac Rehab from 12/12/2020 in [facility] Cardiac and Pulmonary Rehab  Date 12/12/20       Visit Diagnosis: Status post coronary artery stent placement  Patient's Home Medications on Admission:  Current Outpatient Medications:    albuterol (PROVENTIL HFA;VENTOLIN HFA) 108 (90 Base) MCG/ACT inhaler, Inhale into the lungs every 4 (four) hours as needed for wheezing or shortness of breath., Disp: , Rfl:    aspirin EC 81 MG EC tablet, Take 1 tablet (81 mg total) by mouth daily. Swallow whole., Disp: 30 tablet, Rfl: 11   carvedilol (COREG) 3.125 MG tablet, Take 1 tablet (3.125 mg total) by mouth 2 (two) times daily with a meal., Disp: 180 tablet, Rfl: 0   citalopram (CELEXA) 20 MG tablet, Take 20 mg by mouth daily. , Disp: , Rfl:    clopidogrel (PLAVIX) 75 MG tablet, Take (4 tablets) 300 mg starting Fri 12/08/20, Then starting the next day take (1) tablet 75 mg daily., Disp: 35 tablet, Rfl: 2   dapagliflozin propanediol (FARXIGA) 10 MG TABS tablet, Take 1 tablet (10 mg total) by mouth daily before breakfast., Disp: 90 tablet, Rfl: 3   EPINEPHrine 0.3 mg/0.3 mL IJ SOAJ injection, Inject 0.3 mg into the muscle as needed. , Disp: , Rfl:    fexofenadine (ALLEGRA) 180 MG tablet, Take 180 mg by mouth daily., Disp: , Rfl:    finasteride (PROSCAR) 5 MG tablet, Take 5 mg by mouth daily., Disp: , Rfl:    gabapentin (NEURONTIN) 800 MG tablet, Take 1 tablet by mouth 3 (three) times daily., Disp: , Rfl:    isosorbide mononitrate (IMDUR) 60 MG 24 hr tablet, Take 1 tablet (60 mg total) by mouth daily., Disp: 90 tablet, Rfl: 3   losartan (COZAAR) 25 MG tablet, TAKE 1 TABLET EVERY DAY, Disp: 90 tablet, Rfl: 0    Multiple Vitamins-Minerals (HAIR SKIN AND NAILS FORMULA PO), Take 1 capsule by mouth daily., Disp: , Rfl:    nitroGLYCERIN (NITROSTAT) 0.4 MG SL tablet, Place 1 tablet (0.4 mg total) under the tongue every 5 (five) minutes as needed for chest pain., Disp: 90 tablet, Rfl: 1   ondansetron (ZOFRAN) 4 MG tablet, Take 4 mg by mouth every 8 (eight) hours as needed for nausea or vomiting., Disp: , Rfl:    oxyCODONE (OXY IR/ROXICODONE) 5 MG immediate release tablet, take 1/2-1 tablet by oral route  every 8 hours as needed as needed, Disp: , Rfl:    pantoprazole (PROTONIX) 40 MG tablet, Take 40 mg by mouth 2 (two) times daily. , Disp: , Rfl:    Peppermint Oil (IBGARD PO), Take by mouth daily at 2 am., Disp: , Rfl:    potassium chloride SA (KLOR-CON) 20 MEQ tablet, Take 1 tablet (20 mEq total) by mouth daily., Disp: 30 tablet, Rfl: 2   pregabalin (LYRICA) 100 MG capsule, Take 100 mg by mouth 3 (three) times daily., Disp: , Rfl:    Probiotic Product (PROBIOTIC PO), Take by mouth daily at 8 pm., Disp: , Rfl:    rOPINIRole (REQUIP) 1 MG tablet, Take 1 mg by mouth 2 (two) times daily., Disp: , Rfl:    rosuvastatin (CRESTOR) 40 MG tablet, TAKE 1 TABLET EVERY DAY (DOSE  INCREASE), Disp: 90 tablet, Rfl: 1   torsemide (DEMADEX) 20 MG tablet, Take 2 tablets (40 mg total) by mouth daily., Disp: , Rfl:    traMADol (ULTRAM) 50 MG tablet, Take 50 mg by mouth every 6 (six) hours as needed., Disp: , Rfl:    VASCEPA 1 g capsule, TAKE 2 CAPSULES TWICE DAILY, Disp: 360 capsule, Rfl: 1   vitamin B-12 (CYANOCOBALAMIN) 1000 MCG tablet, Take 1,000 mcg by mouth daily., Disp: , Rfl:    Mesalamine 800 MG TBEC, Take 1,600 mg by mouth in the morning, at noon, and at bedtime., Disp: , Rfl:    spironolactone (ALDACTONE) 25 MG tablet, Take 1 tablet (25 mg total) by mouth daily., Disp: 90 tablet, Rfl: 0  Past Medical History: Past Medical History:  Diagnosis Date   Anxiety    Arthritis    Barrett's esophagus    Bowel obstruction  ([place]) 02/2009   small bowel   CHF (congestive heart failure) (HCC)    Chronic chest pain    Coronary artery disease    a. 1993 CABG; b. 2007 Redo CABG; c. 09/2015 Cath: LM mild dzs, LAD 100ost, RI mild dzs, LCX 60p, OM1 100, RCA 100ost, VG->OM2 min irregs, LIMA->LAD nl, VG->RPDA 30ost, 20d, nl EF->Med rx.   Depression    Diverticulitis    Diverticulosis    Erectile dysfunction    Fibromyalgia    Gastric ulcer    Gastritis    GERD (gastroesophageal reflux disease)    Headache    Hemorrhoids    HFrEF (heart failure with reduced ejection fraction) ([place])    a. 09/2016 Echo: EF 55-60%, no rwma, triv AI, mild MR, mildly dil LA. -> as of April 2020: EF 40 to 45%.  Septal dyssynergy/hypokinesis due to postop state but otherwise unable to assess wall motion [name] due to poor study.   History of myocardial infarct at age less than 55 years    Hyperlipidemia    Hypertension    Kidney stones    going to [facility]  June 26 to see kidney specialist   Lightheadedness    Mass on back    Nephrolithiasis    Orthostatic hypotension    a. Improved after discontinuation of metoprolol.   OSA (obstructive sleep apnea)    a. On CPAP.   Pulmonary embolism ([place]) 01/2006   Post-op/treated   Skin lesions, generalized    Stroke [facility]) 1993   right side weakness no blood thinners   on Aspirin   Syncope and collapse    Urticaria     Tobacco Use: Social History   Tobacco Use  Smoking Status Former   Packs/day: 2.50   Types: Cigarettes   Quit date: 08/21/1991   Years since quitting: 29.3  Smokeless Tobacco Never    Labs: Recent Review Flowsheet Data     Labs for ITP Cardiac and Pulmonary Rehab Latest Ref Rng & Units 08/01/2018 12/11/2019 12/13/2019 02/07/2020 11/14/2020   Cholestrol 0 - 200 mg/dL 154 - 181 165 -   LDLCALC 0 - 99 mg/dL 56 - 84 61 -   HDL >40 mg/dL 41 - 35(L) 41 -   Trlycerides <150 mg/dL 285(H) - 312(H) 317(H) -   Hemoglobin A1c 4.8 - 5.6 % - 6.0(H) - - 6.1(H)         Exercise Target Goals: Exercise Program Goal: Individual exercise prescription set using results from initial 6 min walk test and THRR while considering  patient's activity barriers and safety.   Exercise  Prescription Goal: Initial exercise prescription builds to 30-45 minutes a day of aerobic activity, 2-3 days per week.  Home exercise guidelines will be given to patient during program as part of exercise prescription that the participant will acknowledge.   Education: Aerobic Exercise: - Group verbal and visual presentation on the components of exercise prescription. Introduces F.I.T.T principle from ACSM for exercise prescriptions.  Reviews F.I.T.T. principles of aerobic exercise including progression. Written material given at graduation. Flowsheet Row Cardiac Rehab from 09/13/2020 in [facility] Cardiac and Pulmonary Rehab  Education need identified 07/12/20  Date 08/09/20  Educator AS  Instruction Review Code 1- Verbalizes Understanding       Education: Resistance Exercise: - Group verbal and visual presentation on the components of exercise prescription. Introduces F.I.T.T principle from ACSM for exercise prescriptions  Reviews F.I.T.T. principles of resistance exercise including progression. Written material given at graduation. Flowsheet Row Cardiac Rehab from 09/13/2020 in [facility] Cardiac and Pulmonary Rehab  Date 08/16/20  Educator AS  Instruction Review Code 1- Verbalizes Understanding        Education: Exercise & Equipment Safety: - Individual verbal instruction and demonstration of equipment use and safety with use of the equipment. Flowsheet Row Cardiac Rehab from 12/12/2020 in [facility] Cardiac and Pulmonary Rehab  Date 12/12/20  Educator [facility]  Instruction Review Code 1- Verbalizes Understanding       Education: Exercise Physiology & General Exercise Guidelines: - Group verbal and written instruction with models to review the exercise physiology of the cardiovascular  system and associated critical values. Provides general exercise guidelines with specific guidelines to those with heart or lung disease.  Flowsheet Row Cardiac Rehab from 09/13/2020 in [facility] Cardiac and Pulmonary Rehab  Date 08/02/20  Educator [facility]  Instruction Review Code 1- Verbalizes Understanding       Education: Flexibility, Balance, Mind/Body Relaxation: - Group verbal and visual presentation with interactive activity on the components of exercise prescription. Introduces F.I.T.T principle from ACSM for exercise prescriptions. Reviews F.I.T.T. principles of flexibility and balance exercise training including progression. Also discusses the mind body connection.  Reviews various relaxation techniques to help reduce and manage stress (i.e. Deep breathing, progressive muscle relaxation, and visualization). Balance handout provided to take home. Written material given at graduation. Flowsheet Row Cardiac Rehab from 12/12/2020 in [facility] Cardiac and Pulmonary Rehab  Education need identified 12/12/20       Activity Barriers & Risk Stratification:  Activity Barriers & Cardiac Risk Stratification - 12/12/20 0747       Activity Barriers & Cardiac Risk Stratification   Activity Barriers Arthritis;Back Problems;Neck/Spine Problems;Assistive Device;Deconditioning;Muscular Weakness;Shortness of Breath;Balance Concerns;Fibromyalgia    Cardiac Risk Stratification High             6 Minute Walk:  6 Minute Walk     Row Name 12/12/20 0858         6 Minute Walk   Phase Initial     Distance 1215 feet     Walk Time 6 minutes     # of Rest Breaks 0     MPH 2.3     METS 2.85     RPE 13     Perceived Dyspnea  3     VO2 Peak 9.96     Symptoms Yes (comment)     Comments SOB, ankle pain 10/10, knee pain 8/10 (Chronic pain)     Resting HR 65 bpm     Resting BP 144/84     Resting Oxygen Saturation  97 %  Exercise Oxygen Saturation  during 6 min walk 98 %     Max Ex. HR 98 bpm     Max Ex.  BP 152/64     2 Minute Post BP 136/64              Oxygen Initial Assessment:   Oxygen Re-Evaluation:   Oxygen Discharge (Final Oxygen Re-Evaluation):   Initial Exercise Prescription:  Initial Exercise Prescription - 12/12/20 0800       Date of Initial Exercise RX and Referring Provider   Date 12/12/20    Referring Provider End, Harrell Gave MD      Treadmill   MPH 2.3    Grade 0.5    Minutes 15    METs 2.92      Recumbant Bike   Level 2    RPM 50    Watts 25    Minutes 15    METs 2.5      REL-XR   Level 2    Speed 50    Minutes 15    METs 2.5      T5 Nustep   Level 3    SPM 80    Minutes 15    METs 2.5      Prescription Details   Frequency (times per week) 3    Duration Progress to 30 minutes of continuous aerobic without signs/symptoms of physical distress      Intensity   THRR 40-80% of Max Heartrate 99-134    Ratings of Perceived Exertion 11-13    Perceived Dyspnea 0-4      Progression   Progression Continue to progress workloads to maintain intensity without signs/symptoms of physical distress.      Resistance Training   Training Prescription Yes    Weight 4 lb    Reps 10-15             Perform Capillary Blood Glucose checks as needed.  Exercise Prescription Changes:   Exercise Prescription Changes     Row Name 12/12/20 0800             Response to Exercise   Blood Pressure (Admit) 144/84       Blood Pressure (Exercise) 152/64       Blood Pressure (Exit) 136/64       Heart Rate (Admit) 65 bpm       Heart Rate (Exercise) 98 bpm       Heart Rate (Exit) 72 bpm       Oxygen Saturation (Admit) 97 %       Oxygen Saturation (Exercise) 98 %       Rating of Perceived Exertion (Exercise) 13       Perceived Dyspnea (Exercise) 3       Symptoms SOB, ankle and knee pain       Comments walk test results                Exercise Comments:   Exercise Goals and Review:   Exercise Goals     Row Name 12/12/20 0756              Exercise Goals   Increase Physical Activity Yes       Intervention Provide advice, education, support and counseling about physical activity/exercise needs.;Develop an individualized exercise prescription for aerobic and resistive training based on initial evaluation findings, risk stratification, comorbidities and participant's personal goals.       Expected Outcomes Short Term: Attend rehab on a regular basis to increase amount  of physical activity.;Long Term: Add in home exercise to make exercise part of routine and to increase amount of physical activity.;Long Term: Exercising regularly at least 3-5 days a week.       Increase Strength and Stamina Yes       Intervention Provide advice, education, support and counseling about physical activity/exercise needs.;Develop an individualized exercise prescription for aerobic and resistive training based on initial evaluation findings, risk stratification, comorbidities and participant's personal goals.       Expected Outcomes Short Term: Increase workloads from initial exercise prescription for resistance, speed, and METs.;Short Term: Perform resistance training exercises routinely during rehab and add in resistance training at home;Long Term: Improve cardiorespiratory fitness, muscular endurance and strength as measured by increased METs and functional capacity (6MWT)       Able to understand and use rate of perceived exertion (RPE) scale Yes       Intervention Provide education and explanation on how to use RPE scale       Expected Outcomes Short Term: Able to use RPE daily in rehab to express subjective intensity level;Long Term:  Able to use RPE to guide intensity level when exercising independently       Able to understand and use Dyspnea scale Yes       Intervention Provide education and explanation on how to use Dyspnea scale       Expected Outcomes Short Term: Able to use Dyspnea scale daily in rehab to express subjective sense of  shortness of breath during exertion;Long Term: Able to use Dyspnea scale to guide intensity level when exercising independently       Knowledge and understanding of Target Heart Rate Range (THRR) Yes       Intervention Provide education and explanation of THRR including how the numbers were predicted and where they are located for reference       Expected Outcomes Short Term: Able to state/look up THRR;Long Term: Able to use THRR to govern intensity when exercising independently;Short Term: Able to use daily as guideline for intensity in rehab       Able to check pulse independently Yes       Intervention Provide education and demonstration on how to check pulse in carotid and radial arteries.;Review the importance of being able to check your own pulse for safety during independent exercise       Expected Outcomes Short Term: Able to explain why pulse checking is important during independent exercise;Long Term: Able to check pulse independently and accurately       Understanding of Exercise Prescription Yes       Intervention Provide education, explanation, and written materials on patient's individual exercise prescription       Expected Outcomes Short Term: Able to explain program exercise prescription;Long Term: Able to explain home exercise prescription to exercise independently                Exercise Goals Re-Evaluation :   Discharge Exercise Prescription (Final Exercise Prescription Changes):  Exercise Prescription Changes - 12/12/20 0800       Response to Exercise   Blood Pressure (Admit) 144/84    Blood Pressure (Exercise) 152/64    Blood Pressure (Exit) 136/64    Heart Rate (Admit) 65 bpm    Heart Rate (Exercise) 98 bpm    Heart Rate (Exit) 72 bpm    Oxygen Saturation (Admit) 97 %    Oxygen Saturation (Exercise) 98 %    Rating of Perceived Exertion (Exercise) 13  Perceived Dyspnea (Exercise) 3    Symptoms SOB, ankle and knee pain    Comments walk test results              Nutrition:  Target Goals: Understanding of nutrition guidelines, daily intake of sodium '1500mg'$ , cholesterol '200mg'$ , calories 30% from fat and 7% or less from saturated fats, daily to have 5 or more servings of fruits and vegetables.  Education: All About Nutrition: -Group instruction provided by verbal, written material, interactive activities, discussions, models, and posters to present general guidelines for heart healthy nutrition including fat, fiber, MyPlate, the role of sodium in heart healthy nutrition, utilization of the nutrition label, and utilization of this knowledge for meal planning. Follow up email sent as well. Written material given at graduation. Flowsheet Row Cardiac Rehab from 12/12/2020 in [facility] Cardiac and Pulmonary Rehab  Education need identified 12/12/20       Biometrics:  Pre Biometrics - 12/12/20 0754       Pre Biometrics   Height 5' 10.1" (1.781 m)    Weight 208 lb (94.3 kg)    BMI (Calculated) 29.74    Single Leg Stand 13.4 seconds              Nutrition Therapy Plan and Nutrition Goals:  Nutrition Therapy & Goals - 12/12/20 0752       Intervention Plan   Intervention Prescribe, educate and counsel regarding individualized specific dietary modifications aiming towards targeted core components such as weight, hypertension, lipid management, diabetes, heart failure and other comorbidities.    Expected Outcomes Short Term Goal: Understand basic principles of dietary content, such as calories, fat, sodium, cholesterol and nutrients.;Short Term Goal: A plan has been developed with personal nutrition goals set during dietitian appointment.;Long Term Goal: Adherence to prescribed nutrition plan.             Nutrition Assessments:  MEDIFICTS Score Key: ?70 Need to make dietary changes  40-70 Heart Healthy Diet ? 40 Therapeutic Level Cholesterol Diet  Flowsheet Row Cardiac Rehab from 12/12/2020 in [facility] Cardiac and Pulmonary Rehab  Picture  Your Plate Total Score on Admission 70      Picture Your Plate Scores: [id number] Unhealthy dietary pattern with much room for improvement. 41-50 Dietary pattern unlikely to meet recommendations for good health and room for improvement. 51-60 More healthful dietary pattern, with some room for improvement.  >60 Healthy dietary pattern, although there may be some specific behaviors that could be improved.    Nutrition Goals Re-Evaluation:   Nutrition Goals Discharge (Final Nutrition Goals Re-Evaluation):   Psychosocial: Target Goals: Acknowledge presence or absence of significant depression and/or stress, maximize coping skills, provide positive support system. Participant is able to verbalize types and ability to use techniques and skills needed for reducing stress and depression.   Education: Stress, Anxiety, and Depression - Group verbal and visual presentation to define topics covered.  Reviews how body is impacted by stress, anxiety, and depression.  Also discusses healthy ways to reduce stress and to treat/manage anxiety and depression.  Written material given at graduation. Flowsheet Row Cardiac Rehab from 12/12/2020 in [facility] Cardiac and Pulmonary Rehab  Education need identified 12/12/20       Education: Sleep Hygiene -Provides group verbal and written instruction about how sleep can affect your health.  Define sleep hygiene, discuss sleep cycles and impact of sleep habits. Review good sleep hygiene tips.    Initial Review & Psychosocial Screening:  Initial Psych Review & Screening - 12/12/20 [id number]  Initial Review   Current issues with Current Stress Concerns;Current Psychotropic Meds;Current Sleep Concerns;History of Depression    Source of Stress Concerns Unable to participate in former interests or hobbies;Unable to perform yard/household activities;Family    Comments Knee pain, back pain, shortness of breath are limiting; brother's health (just got LVAD), still lack of  sleep, on [place]? Yes   brother, wife     Barriers   Psychosocial barriers to participate in program The patient should benefit from training in stress management and relaxation.;Psychosocial barriers identified (see note)      Screening Interventions   Interventions Encouraged to exercise;To provide support and resources with identified psychosocial needs;Provide feedback about the scores to participant    Expected Outcomes Short Term goal: Utilizing psychosocial counselor, staff and physician to assist with identification of specific Stressors or current issues interfering with healing process. Setting desired goal for each stressor or current issue identified.;Long Term Goal: Stressors or current issues are controlled or eliminated.;Short Term goal: Identification and review with participant of any Quality of Life or Depression concerns found by scoring the questionnaire.;Long Term goal: The participant improves quality of Life and PHQ9 Scores as seen by post scores and/or verbalization of changes             Quality of Life Scores:   Quality of Life - 12/12/20 0901       Quality of Life   Select Quality of Life      Quality of Life Scores   Health/Function Pre 16.67 %    Socioeconomic Pre 22.13 %    Psych/Spiritual Pre 23.36 %    Family Pre 20.2 %    GLOBAL Pre 19.45 %            Scores of 19 and below usually indicate a poorer quality of life in these areas.  A difference of  2-3 points is a clinically meaningful difference.  A difference of 2-3 points in the total score of the Quality of Life Index has been associated with significant improvement in overall quality of life, self-image, physical symptoms, and general health in studies assessing change in quality of life.  PHQ-9: Recent Review Flowsheet Data     Depression screen [facility] 2/9 12/12/2020 08/16/2020 07/12/2020   Decreased Interest 2 0 3   Down, Depressed, Hopeless 1 0 1    PHQ - 2 Score 3 0 4   Altered sleeping '3 3 1   '$ Tired, decreased energy '3 3 2   '$ Change in appetite 0 1 0   Feeling bad or failure about yourself  1 0 0   Trouble concentrating 1 3 0   Moving slowly or fidgety/restless 1 0 1   Suicidal thoughts 0 0 0   PHQ-9 Score '12 10 8   '$ Difficult doing work/chores Somewhat difficult Somewhat difficult Somewhat difficult      Interpretation of Total Score  Total Score Depression Severity:  1-4 = Minimal depression, 5-9 = Mild depression, 10-14 = Moderate depression, 15-19 = Moderately severe depression, 20-27 = Severe depression   Psychosocial Evaluation and Intervention:  Psychosocial Evaluation - 12/12/20 0750       Psychosocial Evaluation & Interventions   Interventions Encouraged to exercise with the program and follow exercise prescription;Stress management education;Relaxation education    Comments [name] is returning to rehab after another stent placement.  He has also just switched his blood thinner so he is beginning to  feel better.  He has not been having any chest pain, and his breathing is beginning to recover.  He really wants to get back to being able to go and do again without the shortness of breath.  He is on celexa for depression and feeling well managed.  He continues to have issues with sleeping, but that is chronic in nature.  His brother got an LVAD after being in hospital for over a month and recovering. He is still worried about his brother's health.    Expected Outcomes Short; attend cardiac rehab for education and exercise. Long: develop positive self care behaviors.    Continue Psychosocial Services  Follow up required by staff             Psychosocial Re-Evaluation:   Psychosocial Discharge (Final Psychosocial Re-Evaluation):   Vocational Rehabilitation: Provide vocational rehab assistance to qualifying candidates.   Vocational Rehab Evaluation & Intervention:   Education: Education Goals: Education classes  will be provided on a variety of topics geared toward better understanding of heart health and risk factor modification. Participant will state understanding/return demonstration of topics presented as noted by education test scores.  Learning Barriers/Preferences:  Learning Barriers/Preferences - 12/12/20 0752       Learning Barriers/Preferences   Learning Barriers None    Learning Preferences None             General Cardiac Education Topics:  AED/CPR: - Group verbal and written instruction with the use of models to demonstrate the basic use of the AED with the basic ABC's of resuscitation.   Anatomy and Cardiac Procedures: - Group verbal and visual presentation and models provide information about basic cardiac anatomy and function. Reviews the testing methods done to diagnose heart disease and the outcomes of the test results. Describes the treatment choices: Medical Management, Angioplasty, or Coronary Bypass Surgery for treating various heart conditions including Myocardial Infarction, Angina, Valve Disease, and Cardiac Arrhythmias.  Written material given at graduation. Flowsheet Row Cardiac Rehab from 09/13/2020 in [facility] Cardiac and Pulmonary Rehab  Education need identified 07/12/20  Date 08/16/20  Educator SB  Instruction Review Code 1- Verbalizes Understanding       Medication Safety: - Group verbal and visual instruction to review commonly prescribed medications for heart and lung disease. Reviews the medication, class of the drug, and side effects. Includes the steps to properly store meds and maintain the prescription regimen.  Written material given at graduation. Flowsheet Row Cardiac Rehab from 09/13/2020 in [facility] Cardiac and Pulmonary Rehab  Date 09/06/20  Educator [facility]  Instruction Review Code 1- Verbalizes Understanding       Intimacy: - Group verbal instruction through game format to discuss how heart and lung disease can affect sexual intimacy. Written  material given at graduation.. Flowsheet Row Cardiac Rehab from 09/13/2020 in [facility] Cardiac and Pulmonary Rehab  Date 08/09/20  Educator AS  Instruction Review Code 1- Verbalizes Understanding       Know Your Numbers and Heart Failure: - Group verbal and visual instruction to discuss disease risk factors for cardiac and pulmonary disease and treatment options.  Reviews associated critical values for Overweight/Obesity, Hypertension, Cholesterol, and Diabetes.  Discusses basics of heart failure: signs/symptoms and treatments.  Introduces Heart Failure Zone chart for action plan for heart failure.  Written material given at graduation. Flowsheet Row Cardiac Rehab from 09/13/2020 in [facility] Cardiac and Pulmonary Rehab  Date 09/13/20  Educator [facility]  Instruction Review Code 1- Verbalizes Understanding  Infection Prevention: - Provides verbal and written material to individual with discussion of infection control including proper hand washing and proper equipment cleaning during exercise session. Flowsheet Row Cardiac Rehab from 12/12/2020 in [facility] Cardiac and Pulmonary Rehab  Date 12/12/20  Educator [facility]  Instruction Review Code 1- Verbalizes Understanding       Falls Prevention: - Provides verbal and written material to individual with discussion of falls prevention and safety. Flowsheet Row Cardiac Rehab from 12/12/2020 in [facility] Cardiac and Pulmonary Rehab  Date 12/12/20  Educator [facility]  Instruction Review Code 1- Verbalizes Understanding       Other: -Provides group and verbal instruction on various topics (see comments)   Knowledge Questionnaire Score:  Knowledge Questionnaire Score - 12/12/20 0902       Knowledge Questionnaire Score   Pre Score 21/26             Core Components/Risk Factors/Patient Goals at Admission:  Personal Goals and Risk Factors at Admission - 12/12/20 0754       Core Components/Risk Factors/Patient Goals on Admission    Weight Management  Yes;Weight Loss    Intervention Weight Management: Develop a combined nutrition and exercise program designed to reach desired caloric intake, while maintaining appropriate intake of nutrient and fiber, sodium and fats, and appropriate energy expenditure required for the weight goal.;Weight Management: Provide education and appropriate resources to help participant work on and attain dietary goals.;Weight Management/Obesity: Establish reasonable short term and long term weight goals.    Admit Weight 208 lb (94.3 kg)    Goal Weight: Short Term 200 lb (90.7 kg)    Goal Weight: Long Term 190 lb (86.2 kg)    Expected Outcomes Short Term: Continue to assess and modify interventions until short term weight is achieved;Long Term: Adherence to nutrition and physical activity/exercise program aimed toward attainment of established weight goal;Weight Loss: Understanding of general recommendations for a balanced deficit meal plan, which promotes 1-2 lb weight loss per week and includes a negative energy balance of [phone] kcal/d;Understanding recommendations for meals to include 15-35% energy as protein, 25-35% energy from fat, 35-60% energy from carbohydrates, less than '200mg'$  of dietary cholesterol, 20-35 gm of total fiber daily;Understanding of distribution of calorie intake throughout the day with the consumption of 4-5 meals/snacks    Heart Failure Yes    Intervention Provide a combined exercise and nutrition program that is supplemented with education, support and counseling about heart failure. Directed toward relieving symptoms such as shortness of breath, decreased exercise tolerance, and extremity edema.    Expected Outcomes Improve functional capacity of life;Short term: Attendance in program 2-3 days a week with increased exercise capacity. Reported lower sodium intake. Reported increased fruit and vegetable intake. Reports medication compliance.;Short term: Daily weights obtained and reported for increase.  Utilizing diuretic protocols set by physician.;Long term: Adoption of self-care skills and reduction of barriers for early signs and symptoms recognition and intervention leading to self-care maintenance.    Hypertension Yes    Intervention Provide education on lifestyle modifcations including regular physical activity/exercise, weight management, moderate sodium restriction and increased consumption of fresh fruit, vegetables, and low fat dairy, alcohol moderation, and smoking cessation.;Monitor prescription use compliance.    Expected Outcomes Short Term: Continued assessment and intervention until BP is < 140/38m HG in hypertensive participants. < 130/872mHG in hypertensive participants with diabetes, heart failure or chronic kidney disease.;Long Term: Maintenance of blood pressure at goal levels.    Lipids Yes    Intervention Provide education  and support for participant on nutrition & aerobic/resistive exercise along with prescribed medications to achieve LDL '70mg'$ , HDL >'40mg'$ .    Expected Outcomes Short Term: Participant states understanding of desired cholesterol values and is compliant with medications prescribed. Participant is following exercise prescription and nutrition guidelines.;Long Term: Cholesterol controlled with medications as prescribed, with individualized exercise RX and with personalized nutrition plan. Value goals: LDL < '70mg'$ , HDL > 40 mg.             Education:Diabetes - Individual verbal and written instruction to review signs/symptoms of diabetes, desired ranges of glucose level fasting, after meals and with exercise. Acknowledge that pre and post exercise glucose checks will be done for 3 sessions at entry of program.   Core Components/Risk Factors/Patient Goals Review:    Core Components/Risk Factors/Patient Goals at Discharge (Final Review):    ITP Comments:  ITP Comments     Row Name 12/12/20 0857           ITP Comments Updated and reviewed medical  record for new stent on 11/15/20.  Documentation for diagnosis can be found in [facility] note from 11/15/20.  Completed 6MWT and gym orientation. Initial ITP created and sent for review to Dr. Emily Filbert, Medical Director.                Comments: Initial ITP

## 2020-12-13 ENCOUNTER — Other Ambulatory Visit: Payer: Self-pay

## 2020-12-13 DIAGNOSIS — Z955 Presence of coronary angioplasty implant and graft: Secondary | ICD-10-CM | POA: Diagnosis not present

## 2020-12-13 DIAGNOSIS — I214 Non-ST elevation (NSTEMI) myocardial infarction: Secondary | ICD-10-CM

## 2020-12-13 NOTE — Progress Notes (Signed)
Daily Session Note  Patient Details  Name: Willie Clark MRN: [id number] Date of Birth: July 06, 1950 Referring Provider:   Flowsheet Row Cardiac Rehab from 12/12/2020 in [facility] Cardiac and Pulmonary Rehab  Referring Provider End, Harrell Gave MD       Encounter Date: 12/13/2020  Check In:  Session Check In - 12/13/20 0925       Check-In   Supervising physician immediately available to respond to emergencies See telemetry face sheet for immediately available ER MD    Location [facility]-Cardiac & Pulmonary Rehab    Staff Present Birdie Sons, MPA, RN;Melissa [place], RDN, Rowe Pavy, BA, ACSM CEP, Exercise Physiologist;Joseph Tessie Fass, [place]    Virtual Visit No    Medication changes reported     No    Fall or balance concerns reported    No    Warm-up and Cool-down Performed on first and last piece of equipment    Resistance Training Performed Yes    VAD Patient? No    PAD/SET Patient? No      Pain Assessment   Currently in Pain? No/denies                Social History   Tobacco Use  Smoking Status Former   Packs/day: 2.50   Types: Cigarettes   Quit date: 08/21/1991   Years since quitting: 29.3  Smokeless Tobacco Never    Goals Met:  Independence with exercise equipment Exercise tolerated well No report of cardiac concerns or symptoms Strength training completed today  Goals Unmet:  Not Applicable  Comments: First full day of exercise!  Patient was oriented to gym and equipment including functions, settings, policies, and procedures.  Patient's individual exercise prescription and treatment plan were reviewed.  All starting workloads were established based on the results of the 6 minute walk test done at initial orientation visit.  The plan for exercise progression was also introduced and progression will be customized based on patient's performance and goals.     Dr. Emily Filbert is Medical Director for [place].  Dr. Ottie Glazier is Medical Director for [facility] Pulmonary Rehabilitation.

## 2020-12-13 NOTE — Telephone Encounter (Signed)
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

## 2020-12-14 NOTE — Telephone Encounter (Signed)
   Name:  Willie Clark  DOB:  06-14-1950  MRN:  [id number]   Primary Cardiologist: [name], MD  Chart reviewed as part of pre-operative protocol coverage. Patient has complex history of CAD s/p PCIs and CHF. He had PCI as recently as 11/15/20 at which time it was recommended to continue dual antiplatelet therapy for at least 12 months, ideally longer. This was originally with ASA + Brilinta but Brilinta was changed to Plavix earlier this month.   We will need to decline his clearance for this procedure. Case reviewed with Dr. Fletcher Anon who recommends NOT proceeding at least until 10/2021. Please re-send clearance at that time and we will be happy to re-review.   I will route to requesting team to inform them of the decision.  Kathyrn Drown, NP 12/14/2020, 8:42 AM

## 2020-12-15 ENCOUNTER — Encounter: Payer: Medicare HMO | Admitting: *Deleted

## 2020-12-15 ENCOUNTER — Other Ambulatory Visit: Payer: Self-pay

## 2020-12-15 DIAGNOSIS — Z955 Presence of coronary angioplasty implant and graft: Secondary | ICD-10-CM

## 2020-12-15 DIAGNOSIS — I214 Non-ST elevation (NSTEMI) myocardial infarction: Secondary | ICD-10-CM

## 2020-12-15 NOTE — Progress Notes (Signed)
Daily Session Note  Patient Details  Name: Willie Clark MRN: [id number] Date of Birth: 08/21/1950 Referring Provider:   Flowsheet Row Cardiac Rehab from 12/12/2020 in [facility] Cardiac and Pulmonary Rehab  Referring Provider End, Harrell Gave MD       Encounter Date: 12/15/2020  Check In:  Session Check In - 12/15/20 0914       Check-In   Supervising physician immediately available to respond to emergencies See telemetry face sheet for immediately available ER MD    Location [facility]-Cardiac & Pulmonary Rehab    Staff Present Renita Papa, RN BSN;Joseph Lawrence, RCP,RRT,BSRT;Jessica [place], [place], RCEP, CCRP, CCET    Virtual Visit No    Medication changes reported     No    Fall or balance concerns reported    No    Warm-up and Cool-down Performed on first and last piece of equipment    Resistance Training Performed Yes    VAD Patient? No    PAD/SET Patient? No      Pain Assessment   Currently in Pain? No/denies                Social History   Tobacco Use  Smoking Status Former   Packs/day: 2.50   Types: Cigarettes   Quit date: 08/21/1991   Years since quitting: 29.3  Smokeless Tobacco Never    Goals Met:  Independence with exercise equipment Exercise tolerated well No report of concerns or symptoms today Strength training completed today  Goals Unmet:  Not Applicable  Comments: Pt able to follow exercise prescription today without complaint.  Will continue to monitor for progression.    Dr. Emily Filbert is Medical Director for [place].  Dr. Ottie Glazier is Medical Director for [facility] Pulmonary Rehabilitation.

## 2020-12-18 ENCOUNTER — Encounter: Payer: Medicare HMO | Admitting: *Deleted

## 2020-12-18 ENCOUNTER — Other Ambulatory Visit: Payer: Self-pay | Admitting: Family

## 2020-12-18 ENCOUNTER — Other Ambulatory Visit: Payer: Self-pay | Admitting: Cardiovascular Disease

## 2020-12-18 ENCOUNTER — Telehealth: Payer: Self-pay | Admitting: Cardiovascular Disease

## 2020-12-18 ENCOUNTER — Other Ambulatory Visit: Payer: Self-pay | Admitting: Medical

## 2020-12-18 ENCOUNTER — Other Ambulatory Visit: Payer: Self-pay

## 2020-12-18 DIAGNOSIS — I10 Essential (primary) hypertension: Secondary | ICD-10-CM

## 2020-12-18 DIAGNOSIS — Z955 Presence of coronary angioplasty implant and graft: Secondary | ICD-10-CM | POA: Diagnosis not present

## 2020-12-18 DIAGNOSIS — I5020 Unspecified systolic (congestive) heart failure: Secondary | ICD-10-CM

## 2020-12-18 DIAGNOSIS — I5042 Chronic combined systolic (congestive) and diastolic (congestive) heart failure: Secondary | ICD-10-CM

## 2020-12-18 DIAGNOSIS — I5022 Chronic systolic (congestive) heart failure: Secondary | ICD-10-CM

## 2020-12-18 NOTE — Progress Notes (Signed)
Daily Session Note  Patient Details  Name: Willie Clark MRN: [id number] Date of Birth: 09/10/1950 Referring Provider:   Flowsheet Row Cardiac Rehab from 12/12/2020 in [facility] Cardiac and Pulmonary Rehab  Referring Provider End, Harrell Gave MD       Encounter Date: 12/18/2020  Check In:  Session Check In - 12/18/20 0957       Check-In   Supervising physician immediately available to respond to emergencies See telemetry face sheet for immediately available ER MD    Location [facility]-Cardiac & Pulmonary Rehab    Staff Present Heath Lark, RN, BSN, Laveda Norman, BS, ACSM CEP, Exercise Physiologist;Joseph Byron, [place]    Virtual Visit No    Medication changes reported     No    Fall or balance concerns reported    No    Warm-up and Cool-down Performed on first and last piece of equipment    Resistance Training Performed Yes    VAD Patient? No    PAD/SET Patient? No      Pain Assessment   Currently in Pain? No/denies                Social History   Tobacco Use  Smoking Status Former   Packs/day: 2.50   Types: Cigarettes   Quit date: 08/21/1991   Years since quitting: 29.3  Smokeless Tobacco Never    Goals Met:  Independence with exercise equipment Exercise tolerated well No report of concerns or symptoms today  Goals Unmet:  Not Applicable  Comments: Pt able to follow exercise prescription today without complaint.  Will continue to monitor for progression.    Dr. Emily Filbert is Medical Director for [place].  Dr. Ottie Glazier is Medical Director for [facility] Pulmonary Rehabilitation.

## 2020-12-18 NOTE — Telephone Encounter (Signed)
Patient states he is unable to afford [place]. He states his pharmacist tells him there are alternatives that are less expensive. Please call to discuss.

## 2020-12-18 NOTE — Telephone Encounter (Signed)
Can he check with his pharmacy or insurance to see if Vania Rea is covered?

## 2020-12-18 NOTE — Telephone Encounter (Signed)
Spoke with the patient.  Willie Clark [facility] after insurance has covered per pt.  Pt unable to continue taking unless other options available.  Notified pt will discuss with Dr. Fletcher Anon for further recc.  Will also forward to PharmD for possible options to assist with lower cost.  Pt appreciative and will wait for return call.

## 2020-12-19 ENCOUNTER — Other Ambulatory Visit (HOSPITAL_COMMUNITY): Payer: Self-pay | Admitting: Surgery

## 2020-12-19 ENCOUNTER — Other Ambulatory Visit: Payer: Self-pay | Admitting: Surgery

## 2020-12-19 DIAGNOSIS — R1030 Lower abdominal pain, unspecified: Secondary | ICD-10-CM

## 2020-12-19 MED ORDER — EMPAGLIFLOZIN 10 MG PO TABS: 10.0000 mg | ORAL_TABLET | Freq: Every day | ORAL | 5 refills | Status: DC

## 2020-12-19 NOTE — Telephone Encounter (Signed)
D/C 08/16/20

## 2020-12-19 NOTE — Telephone Encounter (Signed)
Vania Rea is his plan's preferred agent. The cost would be $45/month or 125/3 months, if he is not in the coverage gap. If this is not affordable either, he can try applying for patient assistance for either Jardiance or [place] or both.

## 2020-12-19 NOTE — Telephone Encounter (Signed)
Patient made aware of Dr. Fletcher Anon and Marcelle Overlie, PharmDs response and recommendation.  Patient is agreeable with switching from [place] to Jardiance 10 mg daily. [facility] Adv the patient that he could complete his supply of [place] and start Jardiance the next day.  Patient verbalized understanding and and voiced appreciation for the assistance.

## 2020-12-20 ENCOUNTER — Other Ambulatory Visit: Payer: Self-pay

## 2020-12-20 DIAGNOSIS — I214 Non-ST elevation (NSTEMI) myocardial infarction: Secondary | ICD-10-CM

## 2020-12-20 DIAGNOSIS — Z955 Presence of coronary angioplasty implant and graft: Secondary | ICD-10-CM | POA: Diagnosis not present

## 2020-12-20 NOTE — Progress Notes (Signed)
Daily Session Note  Patient Details  Name: Willie Clark MRN: [id number] Date of Birth: 09/25/1950 Referring Provider:   Flowsheet Row Cardiac Rehab from 12/12/2020 in [facility] Cardiac and Pulmonary Rehab  Referring Provider End, Harrell Gave MD       Encounter Date: 12/20/2020  Check In:  Session Check In - 12/20/20 0917       Check-In   Supervising physician immediately available to respond to emergencies See telemetry face sheet for immediately available ER MD    Location [facility]-Cardiac & Pulmonary Rehab    Staff Present Birdie Sons, MPA, Elveria Rising, BA, ACSM CEP, Exercise Physiologist;Joseph Tessie Fass, [place]    Virtual Visit No    Medication changes reported     No    Fall or balance concerns reported    No    Warm-up and Cool-down Performed on first and last piece of equipment    Resistance Training Performed Yes    VAD Patient? No    PAD/SET Patient? No      Pain Assessment   Currently in Pain? No/denies                Social History   Tobacco Use  Smoking Status Former   Packs/day: 2.50   Types: Cigarettes   Quit date: 08/21/1991   Years since quitting: 29.3  Smokeless Tobacco Never    Goals Met:  Independence with exercise equipment Exercise tolerated well No report of concerns or symptoms today Strength training completed today  Goals Unmet:  Not Applicable  Comments: Pt able to follow exercise prescription today without complaint.  Will continue to monitor for progression.    Dr. Emily Filbert is Medical Director for [place].  Dr. Ottie Glazier is Medical Director for [facility] Pulmonary Rehabilitation.

## 2020-12-21 ENCOUNTER — Telehealth: Payer: Self-pay | Admitting: Cardiovascular Disease

## 2020-12-21 MED ORDER — TORSEMIDE 20 MG PO TABS: 40.0000 mg | ORAL_TABLET | Freq: Every day | ORAL | 0 refills | Status: DC

## 2020-12-21 NOTE — Telephone Encounter (Signed)
*  STAT* If patient is at the pharmacy, call can be transferred to refill team.   1. Which medications need to be refilled? (please list name of each medication and dose if known)  Torsemide 20 mg po BID  2. Which pharmacy/location (including street and city if local pharmacy) is medication [facility] sent to[facility]   3. Do they need a 30 day or 90 day supply? 90   PATIENT IS OUT OF MEDS NO DOSE FOR TONIGHT

## 2020-12-22 ENCOUNTER — Encounter: Payer: Medicare HMO | Attending: Internal Medicine

## 2020-12-22 ENCOUNTER — Other Ambulatory Visit: Payer: Self-pay

## 2020-12-22 DIAGNOSIS — Z955 Presence of coronary angioplasty implant and graft: Secondary | ICD-10-CM | POA: Insufficient documentation

## 2020-12-22 DIAGNOSIS — I214 Non-ST elevation (NSTEMI) myocardial infarction: Secondary | ICD-10-CM | POA: Insufficient documentation

## 2020-12-22 NOTE — Progress Notes (Signed)
Daily Session Note  Patient Details  Name: Willie Clark MRN: [id number] Date of Birth: 08-01-1950 Referring Provider:   Flowsheet Row Cardiac Rehab from 12/12/2020 in [facility] Cardiac and Pulmonary Rehab  Referring Provider End, Harrell Gave MD       Encounter Date: 12/22/2020  Check In:  Session Check In - 12/22/20 0928       Check-In   Supervising physician immediately available to respond to emergencies See telemetry face sheet for immediately available ER MD    Location [facility]-Cardiac & Pulmonary Rehab    Staff Present Birdie Sons, MPA, RN;Jessica McArthur, MA, RCEP, CCRP, CCET;Joseph Numa, [place]    Virtual Visit No    Medication changes reported     Yes    Comments stopped taking tarxiane; added meltrexone 2.84m and jardiance 115m   Fall or balance concerns reported    No    Warm-up and Cool-down Performed on first and last piece of equipment    Resistance Training Performed Yes    VAD Patient? No    PAD/SET Patient? No      Pain Assessment   Currently in Pain? No/denies                Social History   Tobacco Use  Smoking Status Former   Packs/day: 2.50   Types: Cigarettes   Quit date: 08/21/1991   Years since quitting: 29.3  Smokeless Tobacco Never    Goals Met:  Independence with exercise equipment Exercise tolerated well No report of concerns or symptoms today Strength training completed today  Goals Unmet:  Not Applicable  Comments: Pt able to follow exercise prescription today without complaint.  Will continue to monitor for progression.  Updated home exercise with pt today.  Pt plans to walk and use treadmill at home for exercise.  We also talked about using staff videos for home use.  Reviewed THR, pulse, RPE, sign and symptoms, pulse oximetery and when to call 911 or MD.  Also discussed weather considerations and indoor options.  Pt voiced understanding.   Dr. MaEmily Filberts Medical Director for [address] [address].  FuOttie Glaziers Medical Director for [facility].

## 2020-12-22 NOTE — Telephone Encounter (Signed)
Patient picked up 30 day supply of jardiance and cost was more than farxiga approx $150 .  Patient not sure if he is in coverage gap but is agreeable to contact [employer] and or pharmacy to discuss and will fu with office if needing further assistance with med cost issue.

## 2020-12-22 NOTE — Telephone Encounter (Signed)
Patient cost for jardiance after this month will be over $400 .  Patient cannot afford this .  He has filled a 30 day supply for now.  Please advise.

## 2020-12-22 NOTE — Telephone Encounter (Signed)
Spoke with the patient. Patient sts that he picked up a 30 day supply of Jardiace and it cost him over $150. He was told that a 90 day supply would be more than $400. Patient was switched from [place] to [place] since Vania Rea is preferred by his insurance and was to be more cost effective. Adv the patient that he me be in the "donut whole" since we are approaching the end of the year. Adv the patient that he could apply for patient assistance through the manufacture. He is willing to try. He will comet to the office to pick up the application. Adv the patient that I will leave the application at the front desk for him to pick up. Patient will complete his portion, attach the documentation and drop it back off.  Patient voiced appreciation for the assistance.

## 2020-12-26 ENCOUNTER — Ambulatory Visit: Payer: Medicare HMO | Admitting: Cardiovascular Disease

## 2020-12-27 ENCOUNTER — Ambulatory Visit
Admission: RE | Admit: 2020-12-27 | Discharge: 2020-12-27 | Disposition: A | Discharge status: Home / Self Care | Payer: Medicare HMO | Source: Ambulatory Visit | Attending: Gastroenterology | Admitting: Gastroenterology

## 2020-12-27 ENCOUNTER — Other Ambulatory Visit: Payer: Self-pay | Admitting: Gastroenterology

## 2020-12-27 ENCOUNTER — Encounter: Payer: Self-pay | Admitting: *Deleted

## 2020-12-27 ENCOUNTER — Other Ambulatory Visit (HOSPITAL_COMMUNITY): Payer: Self-pay | Admitting: Gastroenterology

## 2020-12-27 ENCOUNTER — Other Ambulatory Visit: Payer: Self-pay

## 2020-12-27 DIAGNOSIS — R1032 Left lower quadrant pain: Secondary | ICD-10-CM

## 2020-12-27 DIAGNOSIS — R1031 Right lower quadrant pain: Secondary | ICD-10-CM | POA: Insufficient documentation

## 2020-12-27 DIAGNOSIS — Z955 Presence of coronary angioplasty implant and graft: Secondary | ICD-10-CM

## 2020-12-27 MED ORDER — IOHEXOL 350 MG/ML SOLN
80.0000 mL | Freq: Once | INTRAVENOUS | Status: AC | PRN
  Administered 2020-12-27: 80 mL via INTRAVENOUS

## 2020-12-27 NOTE — Progress Notes (Signed)
Cardiac Individual Treatment Plan  Patient Details  Name: Willie Clark MRN: [id number] Date of Birth: 30-Dec-1950 Referring Provider:   Flowsheet Row Cardiac Rehab from 12/12/2020 in [facility] Cardiac and Pulmonary Rehab  Referring Provider End, Harrell Gave MD       Initial Encounter Date:  Flowsheet Row Cardiac Rehab from 12/12/2020 in [facility] Cardiac and Pulmonary Rehab  Date 12/12/20       Visit Diagnosis: Status post coronary artery stent placement  Patient's Home Medications on Admission:  Current Outpatient Medications:    albuterol (PROVENTIL HFA;VENTOLIN HFA) 108 (90 Base) MCG/ACT inhaler, Inhale into the lungs every 4 (four) hours as needed for wheezing or shortness of breath., Disp: , Rfl:    aspirin EC 81 MG EC tablet, Take 1 tablet (81 mg total) by mouth daily. Swallow whole., Disp: 30 tablet, Rfl: 11   carvedilol (COREG) 3.125 MG tablet, Take 1 tablet (3.125 mg total) by mouth 2 (two) times daily with a meal., Disp: 180 tablet, Rfl: 0   citalopram (CELEXA) 20 MG tablet, Take 20 mg by mouth daily. , Disp: , Rfl:    clopidogrel (PLAVIX) 75 MG tablet, TAKE (4 TABLETS) 300 MG STARTING FRI 12/08/20, THEN STARTING THE NEXT DAY TAKE (1) TABLET 75 MG DAILY., Disp: 90 tablet, Rfl: 1   empagliflozin (JARDIANCE) 10 MG TABS tablet, Take 1 tablet (10 mg total) by mouth daily before breakfast., Disp: 30 tablet, Rfl: 5   EPINEPHrine 0.3 mg/0.3 mL IJ SOAJ injection, Inject 0.3 mg into the muscle as needed. , Disp: , Rfl:    FARXIGA 10 MG TABS tablet, TAKE 1 TABLET BY MOUTH DAILY BEFORE BREAKFAST., Disp: 30 tablet, Rfl: 5   fexofenadine (ALLEGRA) 180 MG tablet, Take 180 mg by mouth daily., Disp: , Rfl:    finasteride (PROSCAR) 5 MG tablet, Take 5 mg by mouth daily., Disp: , Rfl:    gabapentin (NEURONTIN) 800 MG tablet, Take 1 tablet by mouth 3 (three) times daily., Disp: , Rfl:    isosorbide mononitrate (IMDUR) 60 MG 24 hr tablet, Take 1 tablet (60 mg total) by mouth daily., Disp: 90 tablet, Rfl:  3   losartan (COZAAR) 25 MG tablet, TAKE 1 TABLET EVERY DAY, Disp: 90 tablet, Rfl: 0   Mesalamine 800 MG TBEC, Take 1,600 mg by mouth in the morning, at noon, and at bedtime., Disp: , Rfl:    Multiple Vitamins-Minerals (HAIR SKIN AND NAILS FORMULA PO), Take 1 capsule by mouth daily., Disp: , Rfl:    nitroGLYCERIN (NITROSTAT) 0.4 MG SL tablet, Place 1 tablet (0.4 mg total) under the tongue every 5 (five) minutes as needed for chest pain., Disp: 90 tablet, Rfl: 1   ondansetron (ZOFRAN) 4 MG tablet, Take 4 mg by mouth every 8 (eight) hours as needed for nausea or vomiting., Disp: , Rfl:    oxyCODONE (OXY IR/ROXICODONE) 5 MG immediate release tablet, take 1/2-1 tablet by oral route  every 8 hours as needed as needed, Disp: , Rfl:    pantoprazole (PROTONIX) 40 MG tablet, Take 40 mg by mouth 2 (two) times daily. , Disp: , Rfl:    Peppermint Oil (IBGARD PO), Take by mouth daily at 2 am., Disp: , Rfl:    potassium chloride SA (KLOR-CON) 20 MEQ tablet, Take 1 tablet (20 mEq total) by mouth daily., Disp: 30 tablet, Rfl: 2   pregabalin (LYRICA) 100 MG capsule, Take 100 mg by mouth 3 (three) times daily., Disp: , Rfl:    Probiotic Product (PROBIOTIC PO), Take by  mouth daily at 8 pm., Disp: , Rfl:    rOPINIRole (REQUIP) 1 MG tablet, Take 1 mg by mouth 2 (two) times daily., Disp: , Rfl:    rosuvastatin (CRESTOR) 40 MG tablet, TAKE 1 TABLET BY MOUTH EVERY DAY, Disp: 90 tablet, Rfl: 3   spironolactone (ALDACTONE) 25 MG tablet, TAKE 1 TABLET BY MOUTH EVERY DAY, Disp: 90 tablet, Rfl: 3   torsemide (DEMADEX) 20 MG tablet, Take 2 tablets (40 mg total) by mouth daily., Disp: 180 tablet, Rfl: 0   traMADol (ULTRAM) 50 MG tablet, Take 50 mg by mouth every 6 (six) hours as needed., Disp: , Rfl:    VASCEPA 1 g capsule, TAKE 2 CAPSULES TWICE DAILY, Disp: 360 capsule, Rfl: 1   vitamin B-12 (CYANOCOBALAMIN) 1000 MCG tablet, Take 1,000 mcg by mouth daily., Disp: , Rfl:   Past Medical History: Past Medical History:   Diagnosis Date   Anxiety    Arthritis    Barrett's esophagus    Bowel obstruction ([place]) 02/2009   small bowel   CHF (congestive heart failure) (HCC)    Chronic chest pain    Coronary artery disease    a. 1993 CABG; b. 2007 Redo CABG; c. 09/2015 Cath: LM mild dzs, LAD 100ost, RI mild dzs, LCX 60p, OM1 100, RCA 100ost, VG->OM2 min irregs, LIMA->LAD nl, VG->RPDA 30ost, 20d, nl EF->Med rx.   Depression    Diverticulitis    Diverticulosis    Erectile dysfunction    Fibromyalgia    Gastric ulcer    Gastritis    GERD (gastroesophageal reflux disease)    Headache    Hemorrhoids    HFrEF (heart failure with reduced ejection fraction) ([place])    a. 09/2016 Echo: EF 55-60%, no rwma, triv AI, mild MR, mildly dil LA. -> as of April 2020: EF 40 to 45%.  Septal dyssynergy/hypokinesis due to postop state but otherwise unable to assess wall motion [name] due to poor study.   History of myocardial infarct at age less than 40 years    Hyperlipidemia    Hypertension    Kidney stones    going to [facility]  June 26 to see kidney specialist   Lightheadedness    Mass on back    Nephrolithiasis    Orthostatic hypotension    a. Improved after discontinuation of metoprolol.   OSA (obstructive sleep apnea)    a. On CPAP.   Pulmonary embolism ([place]) 01/2006   Post-op/treated   Skin lesions, generalized    Stroke [facility]) 1993   right side weakness no blood thinners   on Aspirin   Syncope and collapse    Urticaria     Tobacco Use: Social History   Tobacco Use  Smoking Status Former   Packs/day: 2.50   Types: Cigarettes   Quit date: 08/21/1991   Years since quitting: 29.3  Smokeless Tobacco Never    Labs: Recent Review Flowsheet Data     Labs for ITP Cardiac and Pulmonary Rehab Latest Ref Rng & Units 08/01/2018 12/11/2019 12/13/2019 02/07/2020 11/14/2020   Cholestrol 0 - 200 mg/dL 154 - 181 165 -   LDLCALC 0 - 99 mg/dL 56 - 84 61 -   HDL >40 mg/dL 41 - 35(L) 41 -   Trlycerides <150 mg/dL  285(H) - 312(H) 317(H) -   Hemoglobin A1c 4.8 - 5.6 % - 6.0(H) - - 6.1(H)        Exercise Target Goals: Exercise Program Goal: Individual exercise prescription set using results from  initial 6 min walk test and THRR while considering  patient's activity barriers and safety.   Exercise Prescription Goal: Initial exercise prescription builds to 30-45 minutes a day of aerobic activity, 2-3 days per week.  Home exercise guidelines will be given to patient during program as part of exercise prescription that the participant will acknowledge.   Education: Aerobic Exercise: - Group verbal and visual presentation on the components of exercise prescription. Introduces F.I.T.T principle from ACSM for exercise prescriptions.  Reviews F.I.T.T. principles of aerobic exercise including progression. Written material given at graduation. Flowsheet Row Cardiac Rehab from 09/13/2020 in [facility] Cardiac and Pulmonary Rehab  Education need identified 07/12/20  Date 08/09/20  Educator AS  Instruction Review Code 1- Verbalizes Understanding       Education: Resistance Exercise: - Group verbal and visual presentation on the components of exercise prescription. Introduces F.I.T.T principle from ACSM for exercise prescriptions  Reviews F.I.T.T. principles of resistance exercise including progression. Written material given at graduation. Flowsheet Row Cardiac Rehab from 09/13/2020 in [facility] Cardiac and Pulmonary Rehab  Date 08/16/20  Educator AS  Instruction Review Code 1- Verbalizes Understanding        Education: Exercise & Equipment Safety: - Individual verbal instruction and demonstration of equipment use and safety with use of the equipment. Flowsheet Row Cardiac Rehab from 12/12/2020 in [facility] Cardiac and Pulmonary Rehab  Date 12/12/20  Educator [facility]  Instruction Review Code 1- Verbalizes Understanding       Education: Exercise Physiology & General Exercise Guidelines: - Group verbal and written  instruction with models to review the exercise physiology of the cardiovascular system and associated critical values. Provides general exercise guidelines with specific guidelines to those with heart or lung disease.  Flowsheet Row Cardiac Rehab from 09/13/2020 in [facility] Cardiac and Pulmonary Rehab  Date 08/02/20  Educator [facility]  Instruction Review Code 1- Verbalizes Understanding       Education: Flexibility, Balance, Mind/Body Relaxation: - Group verbal and visual presentation with interactive activity on the components of exercise prescription. Introduces F.I.T.T principle from ACSM for exercise prescriptions. Reviews F.I.T.T. principles of flexibility and balance exercise training including progression. Also discusses the mind body connection.  Reviews various relaxation techniques to help reduce and manage stress (i.e. Deep breathing, progressive muscle relaxation, and visualization). Balance handout provided to take home. Written material given at graduation. Flowsheet Row Cardiac Rehab from 12/12/2020 in [facility] Cardiac and Pulmonary Rehab  Education need identified 12/12/20       Activity Barriers & Risk Stratification:  Activity Barriers & Cardiac Risk Stratification - 12/12/20 0747       Activity Barriers & Cardiac Risk Stratification   Activity Barriers Arthritis;Back Problems;Neck/Spine Problems;Assistive Device;Deconditioning;Muscular Weakness;Shortness of Breath;Balance Concerns;Fibromyalgia    Cardiac Risk Stratification High             6 Minute Walk:  6 Minute Walk     Row Name 12/12/20 0858         6 Minute Walk   Phase Initial     Distance 1215 feet     Walk Time 6 minutes     # of Rest Breaks 0     MPH 2.3     METS 2.85     RPE 13     Perceived Dyspnea  3     VO2 Peak 9.96     Symptoms Yes (comment)     Comments SOB, ankle pain 10/10, knee pain 8/10 (Chronic pain)     Resting HR 65 bpm  Resting BP 144/84     Resting Oxygen Saturation  97 %      Exercise Oxygen Saturation  during 6 min walk 98 %     Max Ex. HR 98 bpm     Max Ex. BP 152/64     2 Minute Post BP 136/64              Oxygen Initial Assessment:   Oxygen Re-Evaluation:   Oxygen Discharge (Final Oxygen Re-Evaluation):   Initial Exercise Prescription:  Initial Exercise Prescription - 12/12/20 0800       Date of Initial Exercise RX and Referring Provider   Date 12/12/20    Referring Provider End, Harrell Gave MD      Treadmill   MPH 2.3    Grade 0.5    Minutes 15    METs 2.92      Recumbant Bike   Level 2    RPM 50    Watts 25    Minutes 15    METs 2.5      REL-XR   Level 2    Speed 50    Minutes 15    METs 2.5      T5 Nustep   Level 3    SPM 80    Minutes 15    METs 2.5      Prescription Details   Frequency (times per week) 3    Duration Progress to 30 minutes of continuous aerobic without signs/symptoms of physical distress      Intensity   THRR 40-80% of Max Heartrate 99-134    Ratings of Perceived Exertion 11-13    Perceived Dyspnea 0-4      Progression   Progression Continue to progress workloads to maintain intensity without signs/symptoms of physical distress.      Resistance Training   Training Prescription Yes    Weight 4 lb    Reps 10-15             Perform Capillary Blood Glucose checks as needed.  Exercise Prescription Changes:   Exercise Prescription Changes     Row Name 12/12/20 0800 12/18/20 1400 12/22/20 1000         Response to Exercise   Blood Pressure (Admit) 144/84 146/70 --     Blood Pressure (Exercise) 152/64 128/78 --     Blood Pressure (Exit) 136/64 124/64 --     Heart Rate (Admit) 65 bpm 59 bpm --     Heart Rate (Exercise) 98 bpm 97 bpm --     Heart Rate (Exit) 72 bpm 55 bpm --     Oxygen Saturation (Admit) 97 % -- --     Oxygen Saturation (Exercise) 98 % -- --     Rating of Perceived Exertion (Exercise) 13 13 --     Perceived Dyspnea (Exercise) 3 -- --     Symptoms SOB, ankle and  knee pain none --     Comments walk test results third full day of exercise --     Duration -- Continue with 30 min of aerobic exercise without signs/symptoms of physical distress. --     Intensity -- THRR unchanged --           Progression   Progression -- Continue to progress workloads to maintain intensity without signs/symptoms of physical distress. --     Average METs -- 2.94 --           Resistance Training   Training Prescription -- Yes --  Weight -- 4 lb --     Reps -- 10-15 --           Interval Training   Interval Training -- No --           Treadmill   MPH -- 2.3 --     Grade -- 0.5 --     Minutes -- 15 --     METs -- 2.92 --           Recumbant Bike   Level -- 7 --     Watts -- 26 --     Minutes -- 15 --     METs -- 2.83 --           REL-XR   Level -- 6 --     Minutes -- 15 --     METs -- 3.7 --           T5 Nustep   Level -- 6 --     Minutes -- 15 --     METs -- 2.3 --           Home Exercise Plan   Plans to continue exercise at -- -- Home (comment)  walking, treadmill     Frequency -- -- Add 2 additional days to program exercise sessions.     Initial Home Exercises Provided -- -- 12/22/20              Exercise Comments:   Exercise Comments     Row Name 12/13/20 [id number]           Exercise Comments First full day of exercise!  Patient was oriented to gym and equipment including functions, settings, policies, and procedures.  Patient's individual exercise prescription and treatment plan were reviewed.  All starting workloads were established based on the results of the 6 minute walk test done at initial orientation visit.  The plan for exercise progression was also introduced and progression will be customized based on patient's performance and goals.                Exercise Goals and Review:   Exercise Goals     Row Name 12/12/20 0756             Exercise Goals   Increase Physical Activity Yes       Intervention  Provide advice, education, support and counseling about physical activity/exercise needs.;Develop an individualized exercise prescription for aerobic and resistive training based on initial evaluation findings, risk stratification, comorbidities and participant's personal goals.       Expected Outcomes Short Term: Attend rehab on a regular basis to increase amount of physical activity.;Long Term: Add in home exercise to make exercise part of routine and to increase amount of physical activity.;Long Term: Exercising regularly at least 3-5 days a week.       Increase Strength and Stamina Yes       Intervention Provide advice, education, support and counseling about physical activity/exercise needs.;Develop an individualized exercise prescription for aerobic and resistive training based on initial evaluation findings, risk stratification, comorbidities and participant's personal goals.       Expected Outcomes Short Term: Increase workloads from initial exercise prescription for resistance, speed, and METs.;Short Term: Perform resistance training exercises routinely during rehab and add in resistance training at home;Long Term: Improve cardiorespiratory fitness, muscular endurance and strength as measured by increased METs and functional capacity (6MWT)       Able to understand and use rate of perceived exertion (  RPE) scale Yes       Intervention Provide education and explanation on how to use RPE scale       Expected Outcomes Short Term: Able to use RPE daily in rehab to express subjective intensity level;Long Term:  Able to use RPE to guide intensity level when exercising independently       Able to understand and use Dyspnea scale Yes       Intervention Provide education and explanation on how to use Dyspnea scale       Expected Outcomes Short Term: Able to use Dyspnea scale daily in rehab to express subjective sense of shortness of breath during exertion;Long Term: Able to use Dyspnea scale to guide  intensity level when exercising independently       Knowledge and understanding of Target Heart Rate Range (THRR) Yes       Intervention Provide education and explanation of THRR including how the numbers were predicted and where they are located for reference       Expected Outcomes Short Term: Able to state/look up THRR;Long Term: Able to use THRR to govern intensity when exercising independently;Short Term: Able to use daily as guideline for intensity in rehab       Able to check pulse independently Yes       Intervention Provide education and demonstration on how to check pulse in carotid and radial arteries.;Review the importance of being able to check your own pulse for safety during independent exercise       Expected Outcomes Short Term: Able to explain why pulse checking is important during independent exercise;Long Term: Able to check pulse independently and accurately       Understanding of Exercise Prescription Yes       Intervention Provide education, explanation, and written materials on patient's individual exercise prescription       Expected Outcomes Short Term: Able to explain program exercise prescription;Long Term: Able to explain home exercise prescription to exercise independently                Exercise Goals Re-Evaluation :  Exercise Goals Re-Evaluation     Row Name 12/13/20 [id number] 12/18/20 1413 12/22/20 1024         Exercise Goal Re-Evaluation   Exercise Goals Review Increase Physical Activity;Able to understand and use rate of perceived exertion (RPE) scale;Knowledge and understanding of Target Heart Rate Range (THRR);Understanding of Exercise Prescription;Increase Strength and Stamina;Able to understand and use Dyspnea scale;Able to check pulse independently Increase Physical Activity;Increase Strength and Stamina;Understanding of Exercise Prescription Increase Physical Activity;Increase Strength and Stamina;Understanding of Exercise Prescription     Comments  Reviewed RPE and dyspnea scales, THR and program prescription with pt today.  Pt voiced understanding and was given a copy of goals to take home. [name] is off to a good start returning to rehab.  He has completed his first three full days already thus far.  He is already up to level 7 on the bike and level 6 on the T5 NuStep.  We will continue to monitor his progress. Updated home exercise with pt today.  Pt plans to walk and use treadmill at home for exercise.  We also talked about using staff videos for home use.  Reviewed THR, pulse, RPE, sign and symptoms, pulse oximetery and when to call 911 or MD.  Also discussed weather considerations and indoor options.  Pt voiced understanding.     Expected Outcomes Short: Use RPE daily to regulate intensity. Long: Follow program prescription  in THR. Short: Continue to attend rehab regularly Long: Continue to follow program prescription Short: Continue to use treadmill on off days Long: Continue to improve stamina.              Discharge Exercise Prescription (Final Exercise Prescription Changes):  Exercise Prescription Changes - 12/22/20 1000       Home Exercise Plan   Plans to continue exercise at Home (comment)   walking, treadmill   Frequency Add 2 additional days to program exercise sessions.    Initial Home Exercises Provided 12/22/20             Nutrition:  Target Goals: Understanding of nutrition guidelines, daily intake of sodium '1500mg'$ , cholesterol '200mg'$ , calories 30% from fat and 7% or less from saturated fats, daily to have 5 or more servings of fruits and vegetables.  Education: All About Nutrition: -Group instruction provided by verbal, written material, interactive activities, discussions, models, and posters to present general guidelines for heart healthy nutrition including fat, fiber, MyPlate, the role of sodium in heart healthy nutrition, utilization of the nutrition label, and utilization of this knowledge for meal planning.  Follow up email sent as well. Written material given at graduation. Flowsheet Row Cardiac Rehab from 12/12/2020 in [facility] Cardiac and Pulmonary Rehab  Education need identified 12/12/20       Biometrics:  Pre Biometrics - 12/12/20 0754       Pre Biometrics   Height 5' 10.1" (1.781 m)    Weight 208 lb (94.3 kg)    BMI (Calculated) 29.74    Single Leg Stand 13.4 seconds              Nutrition Therapy Plan and Nutrition Goals:  Nutrition Therapy & Goals - 12/12/20 0752       Intervention Plan   Intervention Prescribe, educate and counsel regarding individualized specific dietary modifications aiming towards targeted core components such as weight, hypertension, lipid management, diabetes, heart failure and other comorbidities.    Expected Outcomes Short Term Goal: Understand basic principles of dietary content, such as calories, fat, sodium, cholesterol and nutrients.;Short Term Goal: A plan has been developed with personal nutrition goals set during dietitian appointment.;Long Term Goal: Adherence to prescribed nutrition plan.             Nutrition Assessments:  MEDIFICTS Score Key: ?70 Need to make dietary changes  40-70 Heart Healthy Diet ? 40 Therapeutic Level Cholesterol Diet  Flowsheet Row Cardiac Rehab from 12/12/2020 in [facility] Cardiac and Pulmonary Rehab  Picture Your Plate Total Score on Admission 70      Picture Your Plate Scores: [id number] Unhealthy dietary pattern with much room for improvement. 41-50 Dietary pattern unlikely to meet recommendations for good health and room for improvement. 51-60 More healthful dietary pattern, with some room for improvement.  >60 Healthy dietary pattern, although there may be some specific behaviors that could be improved.    Nutrition Goals Re-Evaluation:   Nutrition Goals Discharge (Final Nutrition Goals Re-Evaluation):   Psychosocial: Target Goals: Acknowledge presence or absence of significant depression and/or  stress, maximize coping skills, provide positive support system. Participant is able to verbalize types and ability to use techniques and skills needed for reducing stress and depression.   Education: Stress, Anxiety, and Depression - Group verbal and visual presentation to define topics covered.  Reviews how body is impacted by stress, anxiety, and depression.  Also discusses healthy ways to reduce stress and to treat/manage anxiety and depression.  Written material given at  graduation. Flowsheet Row Cardiac Rehab from 12/12/2020 in [facility] Cardiac and Pulmonary Rehab  Education need identified 12/12/20       Education: Sleep Hygiene -Provides group verbal and written instruction about how sleep can affect your health.  Define sleep hygiene, discuss sleep cycles and impact of sleep habits. Review good sleep hygiene tips.    Initial Review & Psychosocial Screening:  Initial Psych Review & Screening - 12/12/20 0748       Initial Review   Current issues with Current Stress Concerns;Current Psychotropic Meds;Current Sleep Concerns;History of Depression    Source of Stress Concerns Unable to participate in former interests or hobbies;Unable to perform yard/household activities;Family    Comments Knee pain, back pain, shortness of breath are limiting; brother's health (just got LVAD), still lack of sleep, on [place]? Yes   brother, wife     Barriers   Psychosocial barriers to participate in program The patient should benefit from training in stress management and relaxation.;Psychosocial barriers identified (see note)      Screening Interventions   Interventions Encouraged to exercise;To provide support and resources with identified psychosocial needs;Provide feedback about the scores to participant    Expected Outcomes Short Term goal: Utilizing psychosocial counselor, staff and physician to assist with identification of specific Stressors or current  issues interfering with healing process. Setting desired goal for each stressor or current issue identified.;Long Term Goal: Stressors or current issues are controlled or eliminated.;Short Term goal: Identification and review with participant of any Quality of Life or Depression concerns found by scoring the questionnaire.;Long Term goal: The participant improves quality of Life and PHQ9 Scores as seen by post scores and/or verbalization of changes             Quality of Life Scores:   Quality of Life - 12/12/20 0901       Quality of Life   Select Quality of Life      Quality of Life Scores   Health/Function Pre 16.67 %    Socioeconomic Pre 22.13 %    Psych/Spiritual Pre 23.36 %    Family Pre 20.2 %    GLOBAL Pre 19.45 %            Scores of 19 and below usually indicate a poorer quality of life in these areas.  A difference of  2-3 points is a clinically meaningful difference.  A difference of 2-3 points in the total score of the Quality of Life Index has been associated with significant improvement in overall quality of life, self-image, physical symptoms, and general health in studies assessing change in quality of life.  PHQ-9: Recent Review Flowsheet Data     Depression screen [facility] 2/9 12/12/2020 08/16/2020 07/12/2020   Decreased Interest 2 0 3   Down, Depressed, Hopeless 1 0 1   PHQ - 2 Score 3 0 4   Altered sleeping '3 3 1   '$ Tired, decreased energy '3 3 2   '$ Change in appetite 0 1 0   Feeling bad or failure about yourself  1 0 0   Trouble concentrating 1 3 0   Moving slowly or fidgety/restless 1 0 1   Suicidal thoughts 0 0 0   PHQ-9 Score '12 10 8   '$ Difficult doing work/chores Somewhat difficult Somewhat difficult Somewhat difficult      Interpretation of Total Score  Total Score Depression Severity:  1-4 = Minimal depression, 5-9 = Mild  depression, 10-14 = Moderate depression, 15-19 = Moderately severe depression, 20-27 = Severe depression   Psychosocial Evaluation  and Intervention:  Psychosocial Evaluation - 12/12/20 0750       Psychosocial Evaluation & Interventions   Interventions Encouraged to exercise with the program and follow exercise prescription;Stress management education;Relaxation education    Comments [name] is returning to rehab after another stent placement.  He has also just switched his blood thinner so he is beginning to feel better.  He has not been having any chest pain, and his breathing is beginning to recover.  He really wants to get back to being able to go and do again without the shortness of breath.  He is on celexa for depression and feeling well managed.  He continues to have issues with sleeping, but that is chronic in nature.  His brother got an LVAD after being in hospital for over a month and recovering. He is still worried about his brother's health.    Expected Outcomes Short; attend cardiac rehab for education and exercise. Long: develop positive self care behaviors.    Continue Psychosocial Services  Follow up required by staff             Psychosocial Re-Evaluation:   Psychosocial Discharge (Final Psychosocial Re-Evaluation):   Vocational Rehabilitation: Provide vocational rehab assistance to qualifying candidates.   Vocational Rehab Evaluation & Intervention:   Education: Education Goals: Education classes will be provided on a variety of topics geared toward better understanding of heart health and risk factor modification. Participant will state understanding/return demonstration of topics presented as noted by education test scores.  Learning Barriers/Preferences:  Learning Barriers/Preferences - 12/12/20 0752       Learning Barriers/Preferences   Learning Barriers None    Learning Preferences None             General Cardiac Education Topics:  AED/CPR: - Group verbal and written instruction with the use of models to demonstrate the basic use of the AED with the basic ABC's of  resuscitation.   Anatomy and Cardiac Procedures: - Group verbal and visual presentation and models provide information about basic cardiac anatomy and function. Reviews the testing methods done to diagnose heart disease and the outcomes of the test results. Describes the treatment choices: Medical Management, Angioplasty, or Coronary Bypass Surgery for treating various heart conditions including Myocardial Infarction, Angina, Valve Disease, and Cardiac Arrhythmias.  Written material given at graduation. Flowsheet Row Cardiac Rehab from 09/13/2020 in [facility] Cardiac and Pulmonary Rehab  Education need identified 07/12/20  Date 08/16/20  Educator SB  Instruction Review Code 1- Verbalizes Understanding       Medication Safety: - Group verbal and visual instruction to review commonly prescribed medications for heart and lung disease. Reviews the medication, class of the drug, and side effects. Includes the steps to properly store meds and maintain the prescription regimen.  Written material given at graduation. Flowsheet Row Cardiac Rehab from 09/13/2020 in [facility] Cardiac and Pulmonary Rehab  Date 09/06/20  Educator [facility]  Instruction Review Code 1- Verbalizes Understanding       Intimacy: - Group verbal instruction through game format to discuss how heart and lung disease can affect sexual intimacy. Written material given at graduation.. Flowsheet Row Cardiac Rehab from 09/13/2020 in [facility] Cardiac and Pulmonary Rehab  Date 08/09/20  Educator AS  Instruction Review Code 1- Verbalizes Understanding       Know Your Numbers and Heart Failure: - Group verbal and visual instruction to discuss  disease risk factors for cardiac and pulmonary disease and treatment options.  Reviews associated critical values for Overweight/Obesity, Hypertension, Cholesterol, and Diabetes.  Discusses basics of heart failure: signs/symptoms and treatments.  Introduces Heart Failure Zone chart for action plan for heart  failure.  Written material given at graduation. Flowsheet Row Cardiac Rehab from 09/13/2020 in [facility] Cardiac and Pulmonary Rehab  Date 09/13/20  Educator [facility]  Instruction Review Code 1- Verbalizes Understanding       Infection Prevention: - Provides verbal and written material to individual with discussion of infection control including proper hand washing and proper equipment cleaning during exercise session. Flowsheet Row Cardiac Rehab from 12/12/2020 in [facility] Cardiac and Pulmonary Rehab  Date 12/12/20  Educator [facility]  Instruction Review Code 1- Verbalizes Understanding       Falls Prevention: - Provides verbal and written material to individual with discussion of falls prevention and safety. Flowsheet Row Cardiac Rehab from 12/12/2020 in [facility] Cardiac and Pulmonary Rehab  Date 12/12/20  Educator [facility]  Instruction Review Code 1- Verbalizes Understanding       Other: -Provides group and verbal instruction on various topics (see comments)   Knowledge Questionnaire Score:  Knowledge Questionnaire Score - 12/12/20 0902       Knowledge Questionnaire Score   Pre Score 21/26             Core Components/Risk Factors/Patient Goals at Admission:  Personal Goals and Risk Factors at Admission - 12/12/20 0754       Core Components/Risk Factors/Patient Goals on Admission    Weight Management Yes;Weight Loss    Intervention Weight Management: Develop a combined nutrition and exercise program designed to reach desired caloric intake, while maintaining appropriate intake of nutrient and fiber, sodium and fats, and appropriate energy expenditure required for the weight goal.;Weight Management: Provide education and appropriate resources to help participant work on and attain dietary goals.;Weight Management/Obesity: Establish reasonable short term and long term weight goals.    Admit Weight 208 lb (94.3 kg)    Goal Weight: Short Term 200 lb (90.7 kg)    Goal Weight: Long Term 190 lb (86.2  kg)    Expected Outcomes Short Term: Continue to assess and modify interventions until short term weight is achieved;Long Term: Adherence to nutrition and physical activity/exercise program aimed toward attainment of established weight goal;Weight Loss: Understanding of general recommendations for a balanced deficit meal plan, which promotes 1-2 lb weight loss per week and includes a negative energy balance of [phone] kcal/d;Understanding recommendations for meals to include 15-35% energy as protein, 25-35% energy from fat, 35-60% energy from carbohydrates, less than '200mg'$  of dietary cholesterol, 20-35 gm of total fiber daily;Understanding of distribution of calorie intake throughout the day with the consumption of 4-5 meals/snacks    Heart Failure Yes    Intervention Provide a combined exercise and nutrition program that is supplemented with education, support and counseling about heart failure. Directed toward relieving symptoms such as shortness of breath, decreased exercise tolerance, and extremity edema.    Expected Outcomes Improve functional capacity of life;Short term: Attendance in program 2-3 days a week with increased exercise capacity. Reported lower sodium intake. Reported increased fruit and vegetable intake. Reports medication compliance.;Short term: Daily weights obtained and reported for increase. Utilizing diuretic protocols set by physician.;Long term: Adoption of self-care skills and reduction of barriers for early signs and symptoms recognition and intervention leading to self-care maintenance.    Hypertension Yes    Intervention Provide education on lifestyle modifcations including  regular physical activity/exercise, weight management, moderate sodium restriction and increased consumption of fresh fruit, vegetables, and low fat dairy, alcohol moderation, and smoking cessation.;Monitor prescription use compliance.    Expected Outcomes Short Term: Continued assessment and intervention  until BP is < 140/25m HG in hypertensive participants. < 130/842mHG in hypertensive participants with diabetes, heart failure or chronic kidney disease.;Long Term: Maintenance of blood pressure at goal levels.    Lipids Yes    Intervention Provide education and support for participant on nutrition & aerobic/resistive exercise along with prescribed medications to achieve LDL '70mg'$ , HDL >'40mg'$ .    Expected Outcomes Short Term: Participant states understanding of desired cholesterol values and is compliant with medications prescribed. Participant is following exercise prescription and nutrition guidelines.;Long Term: Cholesterol controlled with medications as prescribed, with individualized exercise RX and with personalized nutrition plan. Value goals: LDL < '70mg'$ , HDL > 40 mg.             Education:Diabetes - Individual verbal and written instruction to review signs/symptoms of diabetes, desired ranges of glucose level fasting, after meals and with exercise. Acknowledge that pre and post exercise glucose checks will be done for 3 sessions at entry of program.   Core Components/Risk Factors/Patient Goals Review:    Core Components/Risk Factors/Patient Goals at Discharge (Final Review):    ITP Comments:  ITP Comments     Row Name 12/12/20 0857 12/13/20 0926 12/27/20 0748       ITP Comments Updated and reviewed medical record for new stent on 11/15/20.  Documentation for diagnosis can be found in [facility] note from 11/15/20.  Completed 6MWT and gym orientation. Initial ITP created and sent for review to Dr. MaEmily FilbertMedical Director. First full day of exercise!  Patient was oriented to gym and equipment including functions, settings, policies, and procedures.  Patient's individual exercise prescription and treatment plan were reviewed.  All starting workloads were established based on the results of the 6 minute walk test done at initial orientation visit.  The plan for exercise progression was  also introduced and progression will be customized based on patient's performance and goals. 30 Day review completed. Medical Director ITP review done, changes made as directed, and signed approval by Medical Director.              Comments:

## 2020-12-28 ENCOUNTER — Ambulatory Visit: Payer: Medicare HMO | Admitting: Cardiovascular Disease

## 2020-12-28 ENCOUNTER — Other Ambulatory Visit: Payer: Self-pay | Admitting: Cardiovascular Disease

## 2020-12-28 ENCOUNTER — Other Ambulatory Visit: Payer: Self-pay | Admitting: Medical

## 2020-12-28 DIAGNOSIS — I5022 Chronic systolic (congestive) heart failure: Secondary | ICD-10-CM

## 2020-12-28 DIAGNOSIS — I10 Essential (primary) hypertension: Secondary | ICD-10-CM

## 2020-12-29 ENCOUNTER — Encounter: Payer: Medicare HMO | Admitting: *Deleted

## 2020-12-29 ENCOUNTER — Other Ambulatory Visit: Payer: Self-pay

## 2020-12-29 DIAGNOSIS — Z955 Presence of coronary angioplasty implant and graft: Secondary | ICD-10-CM

## 2020-12-29 DIAGNOSIS — I214 Non-ST elevation (NSTEMI) myocardial infarction: Secondary | ICD-10-CM

## 2020-12-29 NOTE — Progress Notes (Signed)
Daily Session Note  Patient Details  Name: Willie Clark MRN: [id number] Date of Birth: 18-Feb-1951 Referring Provider:   Flowsheet Row Cardiac Rehab from 12/12/2020 in [facility] Cardiac and Pulmonary Rehab  Referring Provider End, Harrell Gave MD       Encounter Date: 12/29/2020  Check In:  Session Check In - 12/29/20 0914       Check-In   Supervising physician immediately available to respond to emergencies See telemetry face sheet for immediately available ER MD    Location [facility]-Cardiac & Pulmonary Rehab    Staff Present Renita Papa, RN BSN;Joseph Newbury, RCP,RRT,BSRT;Jessica Sciota, [place], RCEP, CCRP, CCET    Virtual Visit No    Medication changes reported     Yes    Comments temporary medicine for his diverticulitis    Fall or balance concerns reported    No    Warm-up and Cool-down Performed on first and last piece of equipment    Resistance Training Performed Yes    VAD Patient? No    PAD/SET Patient? No      Pain Assessment   Currently in Pain? No/denies                Social History   Tobacco Use  Smoking Status Former   Packs/day: 2.50   Types: Cigarettes   Quit date: 08/21/1991   Years since quitting: 29.3  Smokeless Tobacco Never    Goals Met:  Independence with exercise equipment Exercise tolerated well No report of concerns or symptoms today Strength training completed today  Goals Unmet:  Not Applicable  Comments: Pt able to follow exercise prescription today without complaint.  Will continue to monitor for progression.    Dr. Emily Filbert is Medical Director for [place].  Dr. Ottie Glazier is Medical Director for [facility] Pulmonary Rehabilitation.

## 2021-01-01 ENCOUNTER — Encounter: Payer: Medicare HMO | Admitting: *Deleted

## 2021-01-01 ENCOUNTER — Other Ambulatory Visit: Payer: Self-pay

## 2021-01-01 DIAGNOSIS — Z955 Presence of coronary angioplasty implant and graft: Secondary | ICD-10-CM

## 2021-01-01 NOTE — Progress Notes (Signed)
Daily Session Note  Patient Details  Name: Willie Clark MRN: [id number] Date of Birth: 27-Oct-1950 Referring Provider:   Flowsheet Row Cardiac Rehab from 12/12/2020 in [facility] Cardiac and Pulmonary Rehab  Referring Provider End, Harrell Gave MD       Encounter Date: 01/01/2021  Check In:  Session Check In - 01/01/21 0937       Check-In   Supervising physician immediately available to respond to emergencies See telemetry face sheet for immediately available ER MD    Location [facility]-Cardiac & Pulmonary Rehab    Staff Present Heath Lark, RN, BSN, Laveda Norman, BS, ACSM CEP, Exercise Physiologist;Joseph Truman, RCP,RRT,BSRT;Amanda Girard, [place], ACSM CEP, Exercise Physiologist    Virtual Visit No    Medication changes reported     No    Fall or balance concerns reported    No    Warm-up and Cool-down Performed on first and last piece of equipment    Resistance Training Performed Yes    VAD Patient? No    PAD/SET Patient? No      Pain Assessment   Currently in Pain? No/denies                Social History   Tobacco Use  Smoking Status Former   Packs/day: 2.50   Types: Cigarettes   Quit date: 08/21/1991   Years since quitting: 29.3  Smokeless Tobacco Never    Goals Met:  Independence with exercise equipment Exercise tolerated well No report of concerns or symptoms today  Goals Unmet:  Not Applicable  Comments: Pt able to follow exercise prescription today without complaint.  Will continue to monitor for progression.    Dr. Emily Filbert is Medical Director for [place].  Dr. Ottie Glazier is Medical Director for [facility] Pulmonary Rehabilitation.

## 2021-01-02 ENCOUNTER — Ambulatory Visit
Admission: RE | Admit: 2021-01-02 | Discharge: 2021-01-02 | Disposition: A | Discharge status: Home / Self Care | Payer: Medicare HMO | Source: Ambulatory Visit | Attending: Surgery | Admitting: Surgery

## 2021-01-02 DIAGNOSIS — R1030 Lower abdominal pain, unspecified: Secondary | ICD-10-CM | POA: Diagnosis present

## 2021-01-02 MED ORDER — GADOBUTROL 1 MMOL/ML IV SOLN
9.0000 mL | Freq: Once | INTRAVENOUS | Status: AC | PRN
  Administered 2021-01-02: 9 mL via INTRAVENOUS

## 2021-01-02 NOTE — Addendum Note (Signed)
Addended by: Lamar Laundry on: 01/02/2021 11:13 AM   Modules accepted: Orders

## 2021-01-02 NOTE — Telephone Encounter (Signed)
Patient assistance application for Jardiance completed and placed on Dr. Tyrell Antonio desk to be signed.

## 2021-01-02 NOTE — Telephone Encounter (Signed)
Patient dropped off assistance forms to be completed Placed in nurse box  

## 2021-01-03 ENCOUNTER — Other Ambulatory Visit: Payer: Self-pay

## 2021-01-03 DIAGNOSIS — Z955 Presence of coronary angioplasty implant and graft: Secondary | ICD-10-CM

## 2021-01-03 DIAGNOSIS — I214 Non-ST elevation (NSTEMI) myocardial infarction: Secondary | ICD-10-CM

## 2021-01-03 NOTE — Progress Notes (Signed)
Daily Session Note  Patient Details  Name: DELAINE CANTER MRN: [id number] Date of Birth: 05-10-1950 Referring Provider:   Flowsheet Row Cardiac Rehab from 12/12/2020 in [facility] Cardiac and Pulmonary Rehab  Referring Provider End, Harrell Gave MD       Encounter Date: 01/03/2021  Check In:  Session Check In - 01/03/21 0916       Check-In   Supervising physician immediately available to respond to emergencies See telemetry face sheet for immediately available ER MD    Location [facility]-Cardiac & Pulmonary Rehab    Staff Present Birdie Sons, MPA, Elveria Rising, BA, ACSM CEP, Exercise Physiologist;Joseph Tessie Fass, [place]    Virtual Visit No    Medication changes reported     No    Fall or balance concerns reported    No    Warm-up and Cool-down Performed on first and last piece of equipment    Resistance Training Performed Yes    VAD Patient? No    PAD/SET Patient? No      Pain Assessment   Currently in Pain? No/denies                Social History   Tobacco Use  Smoking Status Former   Packs/day: 2.50   Types: Cigarettes   Quit date: 08/21/1991   Years since quitting: 29.3  Smokeless Tobacco Never    Goals Met:  Independence with exercise equipment Exercise tolerated well No report of concerns or symptoms today Strength training completed today  Goals Unmet:  Not Applicable  Comments: Pt able to follow exercise prescription today without complaint.  Will continue to monitor for progression.    Dr. Emily Filbert is Medical Director for [place].  Dr. Ottie Glazier is Medical Director for [facility] Pulmonary Rehabilitation.

## 2021-01-04 NOTE — Telephone Encounter (Addendum)
Patient assistance application for Jardiance faxed to [employer]. Fax confirmation received. Will await their decision.

## 2021-01-05 ENCOUNTER — Encounter: Payer: Medicare HMO | Admitting: *Deleted

## 2021-01-05 ENCOUNTER — Other Ambulatory Visit: Payer: Self-pay

## 2021-01-05 DIAGNOSIS — Z955 Presence of coronary angioplasty implant and graft: Secondary | ICD-10-CM | POA: Diagnosis not present

## 2021-01-05 NOTE — Progress Notes (Signed)
Daily Session Note  Patient Details  Name: Willie Clark MRN: [id number] Date of Birth: 02/11/1951 Referring Provider:   Flowsheet Row Cardiac Rehab from 12/12/2020 in [facility] Cardiac and Pulmonary Rehab  Referring Provider End, Christopher MD       Encounter Date: 01/05/2021  Check In:  Session Check In - 01/05/21 0909       Check-In   Supervising physician immediately available to respond to emergencies See telemetry face sheet for immediately available ER MD    Location [facility]-Cardiac & Pulmonary Rehab    Staff Present Meredith Craven, RN BSN;Joseph Hood, RCP,RRT,BSRT;Jessica Hawkins, MA, RCEP, CCRP, CCET    Virtual Visit No    Medication changes reported     No    Fall or balance concerns reported    No    Warm-up and Cool-down Performed on first and last piece of equipment    Resistance Training Performed Yes    VAD Patient? No    PAD/SET Patient? No      Pain Assessment   Currently in Pain? No/denies                Social History   Tobacco Use  Smoking Status Former   Packs/day: 2.50   Types: Cigarettes   Quit date: 08/21/1991   Years since quitting: 29.3  Smokeless Tobacco Never    Goals Met:  Independence with exercise equipment Exercise tolerated well No report of concerns or symptoms today Strength training completed today  Goals Unmet:  Not Applicable  Comments: Pt able to follow exercise prescription today without complaint.  Will continue to monitor for progression.    Dr. Mark Miller is Medical Director for [facility].  Dr. Fuad Aleskerov is Medical Director for [facility] Pulmonary Rehabilitation. 

## 2021-01-08 ENCOUNTER — Other Ambulatory Visit: Payer: Self-pay

## 2021-01-08 DIAGNOSIS — Z955 Presence of coronary angioplasty implant and graft: Secondary | ICD-10-CM | POA: Diagnosis not present

## 2021-01-08 DIAGNOSIS — I214 Non-ST elevation (NSTEMI) myocardial infarction: Secondary | ICD-10-CM

## 2021-01-08 NOTE — Progress Notes (Signed)
Daily Session Note  Patient Details  Name: Willie Clark MRN: [id number] Date of Birth: 15-Oct-1950 Referring Provider:   Flowsheet Row Cardiac Rehab from 12/12/2020 in [facility] Cardiac and Pulmonary Rehab  Referring Provider End, Harrell Gave MD       Encounter Date: 01/08/2021  Check In:  Session Check In - 01/08/21 0919       Check-In   Supervising physician immediately available to respond to emergencies See telemetry face sheet for immediately available ER MD    Location [facility]-Cardiac & Pulmonary Rehab    Staff Present Birdie Sons, MPA, RN;Joseph Gas, RCP,RRT,BSRT;Kimara Bencomo Amedeo Plenty, BS, ACSM CEP, Exercise Physiologist    Virtual Visit No    Medication changes reported     No    Fall or balance concerns reported    No    Warm-up and Cool-down Performed on first and last piece of equipment    Resistance Training Performed Yes    VAD Patient? No    PAD/SET Patient? No      Pain Assessment   Currently in Pain? No/denies                Social History   Tobacco Use  Smoking Status Former   Packs/day: 2.50   Types: Cigarettes   Quit date: 08/21/1991   Years since quitting: 29.4  Smokeless Tobacco Never    Goals Met:  Independence with exercise equipment Exercise tolerated well No report of concerns or symptoms today Strength training completed today  Goals Unmet:  Not Applicable  Comments: Pt able to follow exercise prescription today without complaint.  Will continue to monitor for progression.    Dr. Emily Filbert is Medical Director for [place].  Dr. Ottie Glazier is Medical Director for [facility] Pulmonary Rehabilitation.

## 2021-01-10 ENCOUNTER — Other Ambulatory Visit: Payer: Self-pay

## 2021-01-10 DIAGNOSIS — Z955 Presence of coronary angioplasty implant and graft: Secondary | ICD-10-CM | POA: Diagnosis not present

## 2021-01-10 DIAGNOSIS — I214 Non-ST elevation (NSTEMI) myocardial infarction: Secondary | ICD-10-CM

## 2021-01-10 NOTE — Progress Notes (Signed)
Daily Session Note  Patient Details  Name: Willie Clark MRN: [id number] Date of Birth: 1950/10/02 Referring Provider:   Flowsheet Row Cardiac Rehab from 12/12/2020 in [facility] Cardiac and Pulmonary Rehab  Referring Provider End, Harrell Gave MD       Encounter Date: 01/10/2021  Check In:  Session Check In - 01/10/21 0927       Check-In   Supervising physician immediately available to respond to emergencies See telemetry face sheet for immediately available ER MD    Location [facility]-Cardiac & Pulmonary Rehab    Staff Present Birdie Sons, MPA, Elveria Rising, BA, ACSM CEP, Exercise Physiologist;Joseph Tessie Fass, [place]    Virtual Visit No    Medication changes reported     No    Fall or balance concerns reported    No    Warm-up and Cool-down Performed on first and last piece of equipment    Resistance Training Performed Yes    VAD Patient? No    PAD/SET Patient? No      Pain Assessment   Currently in Pain? No/denies                Social History   Tobacco Use  Smoking Status Former   Packs/day: 2.50   Types: Cigarettes   Quit date: 08/21/1991   Years since quitting: 29.4  Smokeless Tobacco Never    Goals Met:  Independence with exercise equipment Exercise tolerated well No report of concerns or symptoms today Strength training completed today  Goals Unmet:  Not Applicable  Comments: Pt able to follow exercise prescription today without complaint.  Will continue to monitor for progression.    Dr. Emily Filbert is Medical Director for [place].  Dr. Ottie Glazier is Medical Director for [facility] Pulmonary Rehabilitation.

## 2021-01-12 ENCOUNTER — Other Ambulatory Visit: Payer: Self-pay

## 2021-01-12 DIAGNOSIS — I214 Non-ST elevation (NSTEMI) myocardial infarction: Secondary | ICD-10-CM

## 2021-01-12 DIAGNOSIS — Z955 Presence of coronary angioplasty implant and graft: Secondary | ICD-10-CM

## 2021-01-12 NOTE — Progress Notes (Signed)
Daily Session Note  Patient Details  Name: Willie Clark MRN: [id number] Date of Birth: 16-Feb-1951 Referring Provider:   Flowsheet Row Cardiac Rehab from 12/12/2020 in [facility] Cardiac and Pulmonary Rehab  Referring Provider End, Harrell Gave MD       Encounter Date: 01/12/2021  Check In:  Session Check In - 01/12/21 0921       Check-In   Supervising physician immediately available to respond to emergencies See telemetry face sheet for immediately available ER MD    Location [facility]-Cardiac & Pulmonary Rehab    Staff Present Hope Budds, RDN, LDN;Jessica Luan Pulling, MA, RCEP, CCRP, CCET;Callin Ashe, RN, BSN    Virtual Visit No    Medication changes reported     No    Fall or balance concerns reported    No    Warm-up and Cool-down Performed on first and last piece of equipment    Resistance Training Performed Yes    VAD Patient? No    PAD/SET Patient? No      Pain Assessment   Currently in Pain? No/denies                Social History   Tobacco Use  Smoking Status Former   Packs/day: 2.50   Types: Cigarettes   Quit date: 08/21/1991   Years since quitting: 29.4  Smokeless Tobacco Never    Goals Met:  Independence with exercise equipment Exercise tolerated well No report of concerns or symptoms today Strength training completed today  Goals Unmet:  Not Applicable  Comments: Pt able to follow exercise prescription today without complaint.  Will continue to monitor for progression.   Dr. Emily Filbert is Medical Director for [place].  Dr. Ottie Glazier is Medical Director for [facility] Pulmonary Rehabilitation.

## 2021-01-15 ENCOUNTER — Encounter: Payer: Medicare HMO | Admitting: *Deleted

## 2021-01-15 ENCOUNTER — Other Ambulatory Visit: Payer: Self-pay

## 2021-01-15 DIAGNOSIS — I214 Non-ST elevation (NSTEMI) myocardial infarction: Secondary | ICD-10-CM

## 2021-01-15 DIAGNOSIS — Z955 Presence of coronary angioplasty implant and graft: Secondary | ICD-10-CM

## 2021-01-15 NOTE — Progress Notes (Signed)
Daily Session Note  Patient Details  Name: Willie Clark MRN: [id number] Date of Birth: 1951/01/25 Referring Provider:   Flowsheet Row Cardiac Rehab from 12/12/2020 in [facility] Cardiac and Pulmonary Rehab  Referring Provider End, Harrell Gave MD       Encounter Date: 01/15/2021  Check In:  Session Check In - 01/15/21 0957       Check-In   Staff Present Nyoka Cowden, RN, BSN, Jennye Moccasin, MPA, RN;Kelly Amedeo Plenty, BS, ACSM CEP, Exercise Physiologist    Virtual Visit No    Medication changes reported     No    Fall or balance concerns reported    No    Tobacco Cessation No Change    Warm-up and Cool-down Performed on first and last piece of equipment    Resistance Training Performed Yes    VAD Patient? No    PAD/SET Patient? No      Pain Assessment   Currently in Pain? No/denies                Social History   Tobacco Use  Smoking Status Former   Packs/day: 2.50   Types: Cigarettes   Quit date: 08/21/1991   Years since quitting: 29.4  Smokeless Tobacco Never    Goals Met:  Independence with exercise equipment Exercise tolerated well No report of concerns or symptoms today  Goals Unmet:  Not Applicable  Comments: Pt able to follow exercise prescription today without complaint.  Will continue to monitor for progression.    Dr. Emily Filbert is Medical Director for [place].  Dr. Ottie Glazier is Medical Director for [facility] Pulmonary Rehabilitation.

## 2021-01-15 NOTE — Progress Notes (Signed)
Daily Session Note  Patient Details  Name: Willie Clark MRN: [id number] Date of Birth: [birth year] Referring Provider:   Flowsheet Row Cardiac Rehab from 12/12/2020 in [facility] Cardiac and Pulmonary Rehab  Referring Provider End, Harrell Gave MD       Encounter Date: 01/15/2021  Check In:  Session Check In - 01/15/21 0931       Check-In   Supervising physician immediately available to respond to emergencies See telemetry face sheet for immediately available ER MD    Location [facility]-Cardiac & Pulmonary Rehab    Staff Present Nyoka Cowden, RN, BSN, Jennye Moccasin, MPA, Mauricia Area, BS, ACSM CEP, Exercise Physiologist    Virtual Visit No    Medication changes reported     No    Fall or balance concerns reported    No    Tobacco Cessation No Change    Warm-up and Cool-down Performed on first and last piece of equipment    Resistance Training Performed Yes    VAD Patient? No    PAD/SET Patient? No                Social History   Tobacco Use  Smoking Status Former   Packs/day: 2.50   Types: Cigarettes   Quit date: 08/21/1991   Years since quitting: 29.4  Smokeless Tobacco Never    Goals Met:  Independence with exercise equipment Exercise tolerated well No report of concerns or symptoms today  Goals Unmet:  Not Applicable  Comments: Pt able to follow exercise prescription today without complaint.  Will continue to monitor for progression.    Dr. Emily Filbert is Medical Director for [place].  Dr. Ottie Glazier is Medical Director for [facility] Pulmonary Rehabilitation.

## 2021-01-17 ENCOUNTER — Encounter: Payer: Medicare HMO | Admitting: *Deleted

## 2021-01-17 ENCOUNTER — Other Ambulatory Visit: Payer: Self-pay

## 2021-01-17 DIAGNOSIS — Z955 Presence of coronary angioplasty implant and graft: Secondary | ICD-10-CM

## 2021-01-17 DIAGNOSIS — I214 Non-ST elevation (NSTEMI) myocardial infarction: Secondary | ICD-10-CM

## 2021-01-17 NOTE — Progress Notes (Signed)
Daily Session Note  Patient Details  Name: Willie Clark MRN: [id number] Date of Birth: 20-Oct-1950 Referring Provider:   Flowsheet Row Cardiac Rehab from 12/12/2020 in [facility] Cardiac and Pulmonary Rehab  Referring Provider End, Harrell Gave MD       Encounter Date: 01/17/2021  Check In:  Session Check In - 01/17/21 0927       Check-In   Supervising physician immediately available to respond to emergencies See telemetry face sheet for immediately available ER MD    Location [facility]-Cardiac & Pulmonary Rehab    Staff Present Nyoka Cowden, RN, BSN, Willette Pa, MA, RCEP, CCRP, [place], [place], ACSM CEP, Exercise Physiologist;Joseph Vandiver, [place]    Virtual Visit No    Medication changes reported     No    Fall or balance concerns reported    No    Tobacco Cessation No Change    Warm-up and Cool-down Performed on first and last piece of equipment    Resistance Training Performed Yes    VAD Patient? No    PAD/SET Patient? No      Pain Assessment   Currently in Pain? No/denies                Social History   Tobacco Use  Smoking Status Former   Packs/day: 2.50   Types: Cigarettes   Quit date: 08/21/1991   Years since quitting: 29.4  Smokeless Tobacco Never    Goals Met:  Independence with exercise equipment Exercise tolerated well No report of concerns or symptoms today  Goals Unmet:  Not Applicable  Comments: Pt able to follow exercise prescription today without complaint.  Will continue to monitor for progression.    Dr. Emily Filbert is Medical Director for [place].  Dr. Ottie Glazier is Medical Director for [facility] Pulmonary Rehabilitation.

## 2021-01-19 ENCOUNTER — Encounter: Payer: Medicare HMO | Admitting: *Deleted

## 2021-01-19 ENCOUNTER — Other Ambulatory Visit: Payer: Self-pay

## 2021-01-19 DIAGNOSIS — I214 Non-ST elevation (NSTEMI) myocardial infarction: Secondary | ICD-10-CM

## 2021-01-19 DIAGNOSIS — Z955 Presence of coronary angioplasty implant and graft: Secondary | ICD-10-CM | POA: Diagnosis not present

## 2021-01-19 NOTE — Progress Notes (Signed)
Daily Session Note  Patient Details  Name: Willie Clark MRN: [id number] Date of Birth: 11-30-1950 Referring Provider:   Flowsheet Row Cardiac Rehab from 12/12/2020 in [facility] Cardiac and Pulmonary Rehab  Referring Provider End, Harrell Gave MD       Encounter Date: 01/19/2021  Check In:  Session Check In - 01/19/21 4174       Check-In   Supervising physician immediately available to respond to emergencies See telemetry face sheet for immediately available ER MD    Location [facility]-Cardiac & Pulmonary Rehab    Staff Present Nyoka Cowden, RN, BSN, Willette Pa, MA, RCEP, CCRP, CCET;Joseph [place], [place]    Virtual Visit No    Medication changes reported     No    Fall or balance concerns reported    No    Tobacco Cessation No Change    Warm-up and Cool-down Performed on first and last piece of equipment    Resistance Training Performed Yes    VAD Patient? No    PAD/SET Patient? No      Pain Assessment   Currently in Pain? No/denies                Social History   Tobacco Use  Smoking Status Former   Packs/day: 2.50   Types: Cigarettes   Quit date: 08/21/1991   Years since quitting: 29.4  Smokeless Tobacco Never    Goals Met:  Independence with exercise equipment Exercise tolerated well No report of concerns or symptoms today  Goals Unmet:  Not Applicable  Comments: Pt able to follow exercise prescription today without complaint.  Will continue to monitor for progression.    Dr. Emily Filbert is Medical Director for [place].  Dr. Ottie Glazier is Medical Director for [facility] Pulmonary Rehabilitation.

## 2021-01-22 ENCOUNTER — Encounter: Payer: Medicare HMO | Attending: Internal Medicine

## 2021-01-22 ENCOUNTER — Other Ambulatory Visit: Payer: Self-pay

## 2021-01-22 DIAGNOSIS — Z48812 Encounter for surgical aftercare following surgery on the circulatory system: Secondary | ICD-10-CM | POA: Insufficient documentation

## 2021-01-22 DIAGNOSIS — Z955 Presence of coronary angioplasty implant and graft: Secondary | ICD-10-CM | POA: Insufficient documentation

## 2021-01-22 DIAGNOSIS — I214 Non-ST elevation (NSTEMI) myocardial infarction: Secondary | ICD-10-CM

## 2021-01-22 NOTE — Progress Notes (Signed)
Daily Session Note  Patient Details  Name: Willie Clark MRN: [id number] Date of Birth: 03-30-1951 Referring Provider:   Flowsheet Row Cardiac Rehab from 12/12/2020 in [facility] Cardiac and Pulmonary Rehab  Referring Provider End, Harrell Gave MD       Encounter Date: 01/22/2021  Check In:  Session Check In - 01/22/21 0913       Check-In   Supervising physician immediately available to respond to emergencies See telemetry face sheet for immediately available ER MD    Location [facility]-Cardiac & Pulmonary Rehab    Staff Present Birdie Sons, MPA, RN;Joseph [place], RCP,RRT,BSRT;[employer] [place], BS, ACSM CEP, Exercise Physiologist    Virtual Visit No    Medication changes reported     No    Fall or balance concerns reported    No    Tobacco Cessation No Change    Warm-up and Cool-down Performed on first and last piece of equipment    Resistance Training Performed Yes    VAD Patient? No    PAD/SET Patient? No      Pain Assessment   Currently in Pain? No/denies                Social History   Tobacco Use  Smoking Status Former   Packs/day: 2.50   Types: Cigarettes   Quit date: 08/21/1991   Years since quitting: 29.4  Smokeless Tobacco Never    Goals Met:  Independence with exercise equipment Exercise tolerated well No report of concerns or symptoms today Strength training completed today  Goals Unmet:  Not Applicable  Comments: Pt able to follow exercise prescription today without complaint.  Will continue to monitor for progression.    Dr. Emily Filbert is Medical Director for [place].  Dr. Ottie Glazier is Medical Director for [facility] Pulmonary Rehabilitation.

## 2021-01-24 ENCOUNTER — Other Ambulatory Visit: Payer: Self-pay

## 2021-01-24 ENCOUNTER — Encounter: Payer: Self-pay | Admitting: *Deleted

## 2021-01-24 DIAGNOSIS — Z955 Presence of coronary angioplasty implant and graft: Secondary | ICD-10-CM

## 2021-01-24 DIAGNOSIS — I214 Non-ST elevation (NSTEMI) myocardial infarction: Secondary | ICD-10-CM

## 2021-01-24 DIAGNOSIS — Z48812 Encounter for surgical aftercare following surgery on the circulatory system: Secondary | ICD-10-CM | POA: Diagnosis not present

## 2021-01-24 NOTE — Progress Notes (Signed)
Cardiac Individual Treatment Plan  Patient Details  Name: Willie Clark MRN: [id number] Date of Birth: 1951/01/16 Referring Provider:   Flowsheet Row Cardiac Rehab from 12/12/2020 in [facility] Cardiac and Pulmonary Rehab  Referring Provider End, Harrell Gave MD       Initial Encounter Date:  Flowsheet Row Cardiac Rehab from 12/12/2020 in [facility] Cardiac and Pulmonary Rehab  Date 12/12/20       Visit Diagnosis: Status post coronary artery stent placement  Patient's Home Medications on Admission:  Current Outpatient Medications:    albuterol (PROVENTIL HFA;VENTOLIN HFA) 108 (90 Base) MCG/ACT inhaler, Inhale into the lungs every 4 (four) hours as needed for wheezing or shortness of breath., Disp: , Rfl:    aspirin EC 81 MG EC tablet, Take 1 tablet (81 mg total) by mouth daily. Swallow whole., Disp: 30 tablet, Rfl: 11   carvedilol (COREG) 3.125 MG tablet, TAKE 1 TABLET (3.125 MG TOTAL) BY MOUTH 2 (TWO) TIMES DAILY WITH A MEAL., Disp: 180 tablet, Rfl: 0   citalopram (CELEXA) 20 MG tablet, Take 20 mg by mouth daily. , Disp: , Rfl:    clopidogrel (PLAVIX) 75 MG tablet, TAKE (4 TABLETS) 300 MG STARTING FRI 12/08/20, THEN STARTING THE NEXT DAY TAKE (1) TABLET 75 MG DAILY., Disp: 90 tablet, Rfl: 1   empagliflozin (JARDIANCE) 10 MG TABS tablet, Take 1 tablet (10 mg total) by mouth daily before breakfast., Disp: 30 tablet, Rfl: 5   EPINEPHrine 0.3 mg/0.3 mL IJ SOAJ injection, Inject 0.3 mg into the muscle as needed. , Disp: , Rfl:    fexofenadine (ALLEGRA) 180 MG tablet, Take 180 mg by mouth daily., Disp: , Rfl:    finasteride (PROSCAR) 5 MG tablet, Take 5 mg by mouth daily., Disp: , Rfl:    gabapentin (NEURONTIN) 800 MG tablet, Take 1 tablet by mouth 3 (three) times daily., Disp: , Rfl:    isosorbide mononitrate (IMDUR) 60 MG 24 hr tablet, Take 1 tablet (60 mg total) by mouth daily., Disp: 90 tablet, Rfl: 3   losartan (COZAAR) 25 MG tablet, TAKE 1 TABLET EVERY DAY, Disp: 90 tablet, Rfl: 0   Mesalamine  800 MG TBEC, Take 1,600 mg by mouth in the morning, at noon, and at bedtime., Disp: , Rfl:    Multiple Vitamins-Minerals (HAIR SKIN AND NAILS FORMULA PO), Take 1 capsule by mouth daily., Disp: , Rfl:    nitroGLYCERIN (NITROSTAT) 0.4 MG SL tablet, Place 1 tablet (0.4 mg total) under the tongue every 5 (five) minutes as needed for chest pain., Disp: 90 tablet, Rfl: 1   ondansetron (ZOFRAN) 4 MG tablet, Take 4 mg by mouth every 8 (eight) hours as needed for nausea or vomiting., Disp: , Rfl:    oxyCODONE (OXY IR/ROXICODONE) 5 MG immediate release tablet, take 1/2-1 tablet by oral route  every 8 hours as needed as needed, Disp: , Rfl:    pantoprazole (PROTONIX) 40 MG tablet, Take 40 mg by mouth 2 (two) times daily. , Disp: , Rfl:    Peppermint Oil (IBGARD PO), Take by mouth daily at 2 am., Disp: , Rfl:    potassium chloride SA (KLOR-CON) 20 MEQ tablet, Take 1 tablet (20 mEq total) by mouth daily., Disp: 30 tablet, Rfl: 2   pregabalin (LYRICA) 100 MG capsule, Take 100 mg by mouth 3 (three) times daily., Disp: , Rfl:    Probiotic Product (PROBIOTIC PO), Take by mouth daily at 8 pm., Disp: , Rfl:    rOPINIRole (REQUIP) 1 MG tablet, Take 1 mg by  mouth 2 (two) times daily., Disp: , Rfl:    rosuvastatin (CRESTOR) 40 MG tablet, TAKE 1 TABLET BY MOUTH EVERY DAY, Disp: 90 tablet, Rfl: 3   spironolactone (ALDACTONE) 25 MG tablet, TAKE 1 TABLET EVERY DAY, Disp: 90 tablet, Rfl: 1   torsemide (DEMADEX) 20 MG tablet, Take 2 tablets (40 mg total) by mouth daily., Disp: 180 tablet, Rfl: 0   traMADol (ULTRAM) 50 MG tablet, Take 50 mg by mouth every 6 (six) hours as needed., Disp: , Rfl:    VASCEPA 1 g capsule, TAKE 2 CAPSULES TWICE DAILY, Disp: 360 capsule, Rfl: 1   vitamin B-12 (CYANOCOBALAMIN) 1000 MCG tablet, Take 1,000 mcg by mouth daily., Disp: , Rfl:   Past Medical History: Past Medical History:  Diagnosis Date   Anxiety    Arthritis    Barrett's esophagus    Bowel obstruction ([place]) 02/2009   small bowel    CHF (congestive heart failure) (HCC)    Chronic chest pain    Coronary artery disease    a. 1993 CABG; b. 2007 Redo CABG; c. 09/2015 Cath: LM mild dzs, LAD 100ost, RI mild dzs, LCX 60p, OM1 100, RCA 100ost, VG->OM2 min irregs, LIMA->LAD nl, VG->RPDA 30ost, 20d, nl EF->Med rx.   Depression    Diverticulitis    Diverticulosis    Erectile dysfunction    Fibromyalgia    Gastric ulcer    Gastritis    GERD (gastroesophageal reflux disease)    Headache    Hemorrhoids    HFrEF (heart failure with reduced ejection fraction) ([place])    a. 09/2016 Echo: EF 55-60%, no rwma, triv AI, mild MR, mildly dil LA. -> as of April 2020: EF 40 to 45%.  Septal dyssynergy/hypokinesis due to postop state but otherwise unable to assess wall motion [name] due to poor study.   History of myocardial infarct at age less than 80 years    Hyperlipidemia    Hypertension    Kidney stones    going to [facility]  June 26 to see kidney specialist   Lightheadedness    Mass on back    Nephrolithiasis    Orthostatic hypotension    a. Improved after discontinuation of metoprolol.   OSA (obstructive sleep apnea)    a. On CPAP.   Pulmonary embolism ([place]) 01/2006   Post-op/treated   Skin lesions, generalized    Stroke [facility]) 1993   right side weakness no blood thinners   on Aspirin   Syncope and collapse    Urticaria     Tobacco Use: Social History   Tobacco Use  Smoking Status Former   Packs/day: 2.50   Types: Cigarettes   Quit date: 08/21/1991   Years since quitting: 29.4  Smokeless Tobacco Never    Labs: Recent Review Flowsheet Data     Labs for ITP Cardiac and Pulmonary Rehab Latest Ref Rng & Units 08/01/2018 12/11/2019 12/13/2019 02/07/2020 11/14/2020   Cholestrol 0 - 200 mg/dL 154 - 181 165 -   LDLCALC 0 - 99 mg/dL 56 - 84 61 -   HDL >40 mg/dL 41 - 35(L) 41 -   Trlycerides <150 mg/dL 285(H) - 312(H) 317(H) -   Hemoglobin A1c 4.8 - 5.6 % - 6.0(H) - - 6.1(H)        Exercise Target Goals: Exercise  Program Goal: Individual exercise prescription set using results from initial 6 min walk test and THRR while considering  patient's activity barriers and safety.   Exercise Prescription Goal: Initial exercise  prescription builds to 30-45 minutes a day of aerobic activity, 2-3 days per week.  Home exercise guidelines will be given to patient during program as part of exercise prescription that the participant will acknowledge.   Education: Aerobic Exercise: - Group verbal and visual presentation on the components of exercise prescription. Introduces F.I.T.T principle from ACSM for exercise prescriptions.  Reviews F.I.T.T. principles of aerobic exercise including progression. Written material given at graduation. Flowsheet Row Cardiac Rehab from 09/13/2020 in [facility] Cardiac and Pulmonary Rehab  Education need identified 07/12/20  Date 08/09/20  Educator AS  Instruction Review Code 1- Verbalizes Understanding       Education: Resistance Exercise: - Group verbal and visual presentation on the components of exercise prescription. Introduces F.I.T.T principle from ACSM for exercise prescriptions  Reviews F.I.T.T. principles of resistance exercise including progression. Written material given at graduation. Flowsheet Row Cardiac Rehab from 09/13/2020 in [facility] Cardiac and Pulmonary Rehab  Date 08/16/20  Educator AS  Instruction Review Code 1- Verbalizes Understanding        Education: Exercise & Equipment Safety: - Individual verbal instruction and demonstration of equipment use and safety with use of the equipment. Flowsheet Row Cardiac Rehab from 01/17/2021 in [facility] Cardiac and Pulmonary Rehab  Date 12/12/20  Educator [facility]  Instruction Review Code 1- Verbalizes Understanding       Education: Exercise Physiology & General Exercise Guidelines: - Group verbal and written instruction with models to review the exercise physiology of the cardiovascular system and associated critical values.  Provides general exercise guidelines with specific guidelines to those with heart or lung disease.  Flowsheet Row Cardiac Rehab from 09/13/2020 in [facility] Cardiac and Pulmonary Rehab  Date 08/02/20  Educator [facility]  Instruction Review Code 1- Verbalizes Understanding       Education: Flexibility, Balance, Mind/Body Relaxation: - Group verbal and visual presentation with interactive activity on the components of exercise prescription. Introduces F.I.T.T principle from ACSM for exercise prescriptions. Reviews F.I.T.T. principles of flexibility and balance exercise training including progression. Also discusses the mind body connection.  Reviews various relaxation techniques to help reduce and manage stress (i.e. Deep breathing, progressive muscle relaxation, and visualization). Balance handout provided to take home. Written material given at graduation. Flowsheet Row Cardiac Rehab from 01/17/2021 in [facility] Cardiac and Pulmonary Rehab  Education need identified 12/12/20       Activity Barriers & Risk Stratification:  Activity Barriers & Cardiac Risk Stratification - 12/12/20 0747       Activity Barriers & Cardiac Risk Stratification   Activity Barriers Arthritis;Back Problems;Neck/Spine Problems;Assistive Device;Deconditioning;Muscular Weakness;Shortness of Breath;Balance Concerns;Fibromyalgia    Cardiac Risk Stratification High             6 Minute Walk:  6 Minute Walk     Row Name 12/12/20 0858         6 Minute Walk   Phase Initial     Distance 1215 feet     Walk Time 6 minutes     # of Rest Breaks 0     MPH 2.3     METS 2.85     RPE 13     Perceived Dyspnea  3     VO2 Peak 9.96     Symptoms Yes (comment)     Comments SOB, ankle pain 10/10, knee pain 8/10 (Chronic pain)     Resting HR 65 bpm     Resting BP 144/84     Resting Oxygen Saturation  97 %     Exercise  Oxygen Saturation  during 6 min walk 98 %     Max Ex. HR 98 bpm     Max Ex. BP 152/64     2 Minute Post BP 136/64               Oxygen Initial Assessment:   Oxygen Re-Evaluation:   Oxygen Discharge (Final Oxygen Re-Evaluation):   Initial Exercise Prescription:  Initial Exercise Prescription - 12/12/20 0800       Date of Initial Exercise RX and Referring Provider   Date 12/12/20    Referring Provider End, Harrell Gave MD      Treadmill   MPH 2.3    Grade 0.5    Minutes 15    METs 2.92      Recumbant Bike   Level 2    RPM 50    Watts 25    Minutes 15    METs 2.5      REL-XR   Level 2    Speed 50    Minutes 15    METs 2.5      T5 Nustep   Level 3    SPM 80    Minutes 15    METs 2.5      Prescription Details   Frequency (times per week) 3    Duration Progress to 30 minutes of continuous aerobic without signs/symptoms of physical distress      Intensity   THRR 40-80% of Max Heartrate 99-134    Ratings of Perceived Exertion 11-13    Perceived Dyspnea 0-4      Progression   Progression Continue to progress workloads to maintain intensity without signs/symptoms of physical distress.      Resistance Training   Training Prescription Yes    Weight 4 lb    Reps 10-15             Perform Capillary Blood Glucose checks as needed.  Exercise Prescription Changes:   Exercise Prescription Changes     Row Name 12/12/20 0800 12/18/20 1400 12/22/20 1000 01/02/21 1200 01/16/21 0800     Response to Exercise   Blood Pressure (Admit) 144/84 146/70 -- 110/62 118/62   Blood Pressure (Exercise) 152/64 128/78 -- 130/62 --   Blood Pressure (Exit) 136/64 124/64 -- 104/62 110/70   Heart Rate (Admit) 65 bpm 59 bpm -- 68 bpm 97 bpm   Heart Rate (Exercise) 98 bpm 97 bpm -- 95 bpm 112 bpm   Heart Rate (Exit) 72 bpm 55 bpm -- 77 bpm 84 bpm   Oxygen Saturation (Admit) 97 % -- -- -- --   Oxygen Saturation (Exercise) 98 % -- -- -- --   Rating of Perceived Exertion (Exercise) 13 13 -- 13 13   Perceived Dyspnea (Exercise) 3 -- -- -- --   Symptoms SOB, ankle and knee pain none --  none none   Comments walk test results third full day of exercise -- -- --   Duration -- Continue with 30 min of aerobic exercise without signs/symptoms of physical distress. -- Continue with 30 min of aerobic exercise without signs/symptoms of physical distress. Continue with 30 min of aerobic exercise without signs/symptoms of physical distress.   Intensity -- THRR unchanged -- THRR unchanged THRR unchanged     Progression   Progression -- Continue to progress workloads to maintain intensity without signs/symptoms of physical distress. -- Continue to progress workloads to maintain intensity without signs/symptoms of physical distress. Continue to progress workloads to maintain intensity without signs/symptoms of  physical distress.   Average METs -- 2.94 -- 2.6 3.73     Resistance Training   Training Prescription -- Yes -- Yes Yes   Weight -- 4 lb -- 4 lb 4 lb   Reps -- 10-15 -- 10-15 10-15     Interval Training   Interval Training -- No -- No No     Treadmill   MPH -- 2.3 -- -- 2.4   Grade -- 0.5 -- -- 4.5   Minutes -- 15 -- -- 15   METs -- 2.92 -- -- 4.33     Recumbant Bike   Level -- 7 -- 7 10   Watts -- 26 -- 33 43   Minutes -- 15 -- 15 15   METs -- 2.83 -- 2.9 4     REL-XR   Level -- 6 -- -- 8   Minutes -- 15 -- -- 15   METs -- 3.7 -- -- 4.5     T5 Nustep   Level -- 6 -- 6 7   Minutes -- 15 -- 15 15   METs -- 2.3 -- 2.3 2.5     Home Exercise Plan   Plans to continue exercise at -- -- Home (comment)  walking, treadmill -- Home (comment)  walking, treadmill   Frequency -- -- Add 2 additional days to program exercise sessions. -- Add 2 additional days to program exercise sessions.   Initial Home Exercises Provided -- -- 12/22/20 -- 12/22/20     Oxygen   Maintain Oxygen Saturation -- -- -- -- 88% or higher            Exercise Comments:   Exercise Comments     Row Name 12/13/20 0926           Exercise Comments First full day of exercise!  Patient was  oriented to gym and equipment including functions, settings, policies, and procedures.  Patient's individual exercise prescription and treatment plan were reviewed.  All starting workloads were established based on the results of the 6 minute walk test done at initial orientation visit.  The plan for exercise progression was also introduced and progression will be customized based on patient's performance and goals.                Exercise Goals and Review:   Exercise Goals     Row Name 12/12/20 0756             Exercise Goals   Increase Physical Activity Yes       Intervention Provide advice, education, support and counseling about physical activity/exercise needs.;Develop an individualized exercise prescription for aerobic and resistive training based on initial evaluation findings, risk stratification, comorbidities and participant's personal goals.       Expected Outcomes Short Term: Attend rehab on a regular basis to increase amount of physical activity.;Long Term: Add in home exercise to make exercise part of routine and to increase amount of physical activity.;Long Term: Exercising regularly at least 3-5 days a week.       Increase Strength and Stamina Yes       Intervention Provide advice, education, support and counseling about physical activity/exercise needs.;Develop an individualized exercise prescription for aerobic and resistive training based on initial evaluation findings, risk stratification, comorbidities and participant's personal goals.       Expected Outcomes Short Term: Increase workloads from initial exercise prescription for resistance, speed, and METs.;Short Term: Perform resistance training exercises routinely during rehab and add in resistance training  at home;Long Term: Improve cardiorespiratory fitness, muscular endurance and strength as measured by increased METs and functional capacity (6MWT)       Able to understand and use rate of perceived exertion (RPE)  scale Yes       Intervention Provide education and explanation on how to use RPE scale       Expected Outcomes Short Term: Able to use RPE daily in rehab to express subjective intensity level;Long Term:  Able to use RPE to guide intensity level when exercising independently       Able to understand and use Dyspnea scale Yes       Intervention Provide education and explanation on how to use Dyspnea scale       Expected Outcomes Short Term: Able to use Dyspnea scale daily in rehab to express subjective sense of shortness of breath during exertion;Long Term: Able to use Dyspnea scale to guide intensity level when exercising independently       Knowledge and understanding of Target Heart Rate Range (THRR) Yes       Intervention Provide education and explanation of THRR including how the numbers were predicted and where they are located for reference       Expected Outcomes Short Term: Able to state/look up THRR;Long Term: Able to use THRR to govern intensity when exercising independently;Short Term: Able to use daily as guideline for intensity in rehab       Able to check pulse independently Yes       Intervention Provide education and demonstration on how to check pulse in carotid and radial arteries.;Review the importance of being able to check your own pulse for safety during independent exercise       Expected Outcomes Short Term: Able to explain why pulse checking is important during independent exercise;Long Term: Able to check pulse independently and accurately       Understanding of Exercise Prescription Yes       Intervention Provide education, explanation, and written materials on patient's individual exercise prescription       Expected Outcomes Short Term: Able to explain program exercise prescription;Long Term: Able to explain home exercise prescription to exercise independently                Exercise Goals Re-Evaluation :  Exercise Goals Re-Evaluation     Row Name 12/13/20 1660  12/18/20 1413 12/22/20 1024 01/02/21 1242 01/05/21 0925     Exercise Goal Re-Evaluation   Exercise Goals Review Increase Physical Activity;Able to understand and use rate of perceived exertion (RPE) scale;Knowledge and understanding of Target Heart Rate Range (THRR);Understanding of Exercise Prescription;Increase Strength and Stamina;Able to understand and use Dyspnea scale;Able to check pulse independently Increase Physical Activity;Increase Strength and Stamina;Understanding of Exercise Prescription Increase Physical Activity;Increase Strength and Stamina;Understanding of Exercise Prescription Increase Physical Activity;Increase Strength and Stamina Increase Physical Activity;Increase Strength and Stamina;Understanding of Exercise Prescription   Comments Reviewed RPE and dyspnea scales, THR and program prescription with pt today.  Pt voiced understanding and was given a copy of goals to take home. Willie Clark is off to a good start returning to rehab.  He has completed his first three full days already thus far.  He is already up to level 7 on the bike and level 6 on the T5 NuStep.  We will continue to monitor his progress. Updated home exercise with pt today.  Pt plans to walk and use treadmill at home for exercise.  We also talked about using staff videos for home  use.  Reviewed THR, pulse, RPE, sign and symptoms, pulse oximetery and when to call 911 or MD.  Also discussed weather considerations and indoor options.  Pt voiced understanding. Willie Clark continues to do well with exercise.  He has increased levels on seated machines.  Staff will encourage increasing Tm also. Willie Clark is doing well in rehab.  His diverticulitis is flared up currently, reducing his ability to exert.  He is trying to stay active at home.  He is starting to regain his strength and his wife keeps him busy.   Expected Outcomes Short: Use RPE daily to regulate intensity. Long: Follow program prescription in THR. Short: Continue to attend rehab  regularly Long: Continue to follow program prescription Short: Continue to use treadmill on off days Long: Continue to improve stamina. Short: increase levels on TM Long:increase MET level Short: Get back to pushing again after flare up Long: COntinue to improve stamina    Row Name 01/16/21 0808             Exercise Goal Re-Evaluation   Exercise Goals Review Increase Physical Activity;Increase Strength and Stamina;Understanding of Exercise Prescription       Comments Willie Clark is doing well in rehab.  He is now doing level 10 on the bike and level 7 on the T5.  We will continue to monitor his progress.       Expected Outcomes Short: Continue to push up on workloads Long: Continue to improve strength                Discharge Exercise Prescription (Final Exercise Prescription Changes):  Exercise Prescription Changes - 01/16/21 0800       Response to Exercise   Blood Pressure (Admit) 118/62    Blood Pressure (Exit) 110/70    Heart Rate (Admit) 97 bpm    Heart Rate (Exercise) 112 bpm    Heart Rate (Exit) 84 bpm    Rating of Perceived Exertion (Exercise) 13    Symptoms none    Duration Continue with 30 min of aerobic exercise without signs/symptoms of physical distress.    Intensity THRR unchanged      Progression   Progression Continue to progress workloads to maintain intensity without signs/symptoms of physical distress.    Average METs 3.73      Resistance Training   Training Prescription Yes    Weight 4 lb    Reps 10-15      Interval Training   Interval Training No      Treadmill   MPH 2.4    Grade 4.5    Minutes 15    METs 4.33      Recumbant Bike   Level 10    Watts 43    Minutes 15    METs 4      REL-XR   Level 8    Minutes 15    METs 4.5      T5 Nustep   Level 7    Minutes 15    METs 2.5      Home Exercise Plan   Plans to continue exercise at Home (comment)   walking, treadmill   Frequency Add 2 additional days to program exercise sessions.     Initial Home Exercises Provided 12/22/20      Oxygen   Maintain Oxygen Saturation 88% or higher             Nutrition:  Target Goals: Understanding of nutrition guidelines, daily intake of sodium <1536m, cholesterol <2018m calories 30%  from fat and 7% or less from saturated fats, daily to have 5 or more servings of fruits and vegetables.  Education: All About Nutrition: -Group instruction provided by verbal, written material, interactive activities, discussions, models, and posters to present general guidelines for heart healthy nutrition including fat, fiber, MyPlate, the role of sodium in heart healthy nutrition, utilization of the nutrition label, and utilization of this knowledge for meal planning. Follow up email sent as well. Written material given at graduation. Flowsheet Row Cardiac Rehab from 01/17/2021 in [facility] Cardiac and Pulmonary Rehab  Education need identified 12/12/20       Biometrics:  Pre Biometrics - 12/12/20 0754       Pre Biometrics   Height 5' 10.1" (1.781 m)    Weight 208 lb (94.3 kg)    BMI (Calculated) 29.74    Single Leg Stand 13.4 seconds              Nutrition Therapy Plan and Nutrition Goals:  Nutrition Therapy & Goals - 01/05/21 0928       Nutrition Therapy   RD appointment deferred Yes   Willie Clark was just here a few months ago and does not want to meet with RD this time.   Fiber 30 grams    Whole Grain Foods 3 servings    Saturated Fats 12 max. grams    Fruits and Vegetables 8 servings/day    Sodium 1.5 grams      Personal Nutrition Goals   Nutrition Goal Lose weight    Comments Focus on heart health eating and good balance      Intervention Plan   Intervention Prescribe, educate and counsel regarding individualized specific dietary modifications aiming towards targeted core components such as weight, hypertension, lipid management, diabetes, heart failure and other comorbidities.    Expected Outcomes Short Term Goal: Understand  basic principles of dietary content, such as calories, fat, sodium, cholesterol and nutrients.;Short Term Goal: A plan has been developed with personal nutrition goals set during dietitian appointment.;Long Term Goal: Adherence to prescribed nutrition plan.             Nutrition Assessments:  MEDIFICTS Score Key: ?70 Need to make dietary changes  40-70 Heart Healthy Diet ? 40 Therapeutic Level Cholesterol Diet  Flowsheet Row Cardiac Rehab from 12/12/2020 in [facility] Cardiac and Pulmonary Rehab  Picture Your Plate Total Score on Admission 70      Picture Your Plate Scores: <00 Unhealthy dietary pattern with much room for improvement. 41-50 Dietary pattern unlikely to meet recommendations for good health and room for improvement. 51-60 More healthful dietary pattern, with some room for improvement.  >60 Healthy dietary pattern, although there may be some specific behaviors that could be improved.    Nutrition Goals Re-Evaluation:   Nutrition Goals Discharge (Final Nutrition Goals Re-Evaluation):   Psychosocial: Target Goals: Acknowledge presence or absence of significant depression and/or stress, maximize coping skills, provide positive support system. Participant is able to verbalize types and ability to use techniques and skills needed for reducing stress and depression.   Education: Stress, Anxiety, and Depression - Group verbal and visual presentation to define topics covered.  Reviews how body is impacted by stress, anxiety, and depression.  Also discusses healthy ways to reduce stress and to treat/manage anxiety and depression.  Written material given at graduation. Flowsheet Row Cardiac Rehab from 01/17/2021 in [facility] Cardiac and Pulmonary Rehab  Education need identified 12/12/20       Education: Sleep Hygiene -Provides group verbal and  written instruction about how sleep can affect your health.  Define sleep hygiene, discuss sleep cycles and impact of sleep habits. Review  good sleep hygiene tips.    Initial Review & Psychosocial Screening:  Initial Psych Review & Screening - 12/12/20 0748       Initial Review   Current issues with Current Stress Concerns;Current Psychotropic Meds;Current Sleep Concerns;History of Depression    Source of Stress Concerns Unable to participate in former interests or hobbies;Unable to perform yard/household activities;Family    Comments Knee pain, back pain, shortness of breath are limiting; brother's health (just got LVAD), still lack of sleep, on [place]? Yes   brother, wife     Barriers   Psychosocial barriers to participate in program The patient should benefit from training in stress management and relaxation.;Psychosocial barriers identified (see note)      Screening Interventions   Interventions Encouraged to exercise;To provide support and resources with identified psychosocial needs;Provide feedback about the scores to participant    Expected Outcomes Short Term goal: Utilizing psychosocial counselor, staff and physician to assist with identification of specific Stressors or current issues interfering with healing process. Setting desired goal for each stressor or current issue identified.;Long Term Goal: Stressors or current issues are controlled or eliminated.;Short Term goal: Identification and review with participant of any Quality of Life or Depression concerns found by scoring the questionnaire.;Long Term goal: The participant improves quality of Life and PHQ9 Scores as seen by post scores and/or verbalization of changes             Quality of Life Scores:   Quality of Life - 12/12/20 0901       Quality of Life   Select Quality of Life      Quality of Life Scores   Health/Function Pre 16.67 %    Socioeconomic Pre 22.13 %    Psych/Spiritual Pre 23.36 %    Family Pre 20.2 %    GLOBAL Pre 19.45 %            Scores of 19 and below usually indicate a poorer  quality of life in these areas.  A difference of  2-3 points is a clinically meaningful difference.  A difference of 2-3 points in the total score of the Quality of Life Index has been associated with significant improvement in overall quality of life, self-image, physical symptoms, and general health in studies assessing change in quality of life.  PHQ-9: Recent Review Flowsheet Data     Depression screen [facility] 2/9 12/12/2020 08/16/2020 07/12/2020   Decreased Interest 2 0 3   Down, Depressed, Hopeless 1 0 1   PHQ - 2 Score 3 0 4   Altered sleeping _0 Tired, decreased energy _1 Change in appetite 0 1 0   Feeling bad or failure about yourself  1 0 0   Trouble concentrating 1 3 0   Moving slowly or fidgety/restless 1 0 1   Suicidal thoughts 0 0 0   PHQ-9 Score _2 Difficult doing work/chores Somewhat difficult Somewhat difficult Somewhat difficult      Interpretation of Total Score  Total Score Depression Severity:  1-4 = Minimal depression, 5-9 = Mild depression, 10-14 = Moderate depression, 15-19 = Moderately severe depression, 20-27 = Severe depression   Psychosocial Evaluation and Intervention:  Psychosocial Evaluation - 12/12/20 0750  Psychosocial Evaluation & Interventions   Interventions Encouraged to exercise with the program and follow exercise prescription;Stress management education;Relaxation education    Comments Willie Clark is returning to rehab after another stent placement.  He has also just switched his blood thinner so he is beginning to feel better.  He has not been having any chest pain, and his breathing is beginning to recover.  He really wants to get back to being able to go and do again without the shortness of breath.  He is on celexa for depression and feeling well managed.  He continues to have issues with sleeping, but that is chronic in nature.  His brother got an LVAD after being in hospital for over a month and recovering. He is still worried  about his brother's health.    Expected Outcomes Short; attend cardiac rehab for education and exercise. Long: develop positive self care behaviors.    Continue Psychosocial Services  Follow up required by staff             Psychosocial Re-Evaluation:  Psychosocial Re-Evaluation     [place] Name 01/05/21 0926             Psychosocial Re-Evaluation   Current issues with Current Sleep Concerns;Current Stress Concerns       Comments Willie Clark continues to struggle with sleep. This past week, his diverticulitis has been keeping him up at night.  His wife keeps him busy and active. He denies any other major stressor as he tries not to let things get to him as much.  He realizes that he can't do anything about it.  He is hurting, but he cannot get injections due to his blood thinner until July.       Expected Outcomes Short: Stay acitive for mental boost Long: continue to work on sleep.       Interventions Encouraged to attend Cardiac Rehabilitation for the exercise                Psychosocial Discharge (Final Psychosocial Re-Evaluation):  Psychosocial Re-Evaluation - 01/05/21 0926       Psychosocial Re-Evaluation   Current issues with Current Sleep Concerns;Current Stress Concerns    Comments Willie Clark continues to struggle with sleep. This past week, his diverticulitis has been keeping him up at night.  His wife keeps him busy and active. He denies any other major stressor as he tries not to let things get to him as much.  He realizes that he can't do anything about it.  He is hurting, but he cannot get injections due to his blood thinner until July.    Expected Outcomes Short: Stay acitive for mental boost Long: continue to work on sleep.    Interventions Encouraged to attend Cardiac Rehabilitation for the exercise             Vocational Rehabilitation: Provide vocational rehab assistance to qualifying candidates.   Vocational Rehab Evaluation &  Intervention:   Education: Education Goals: Education classes will be provided on a variety of topics geared toward better understanding of heart health and risk factor modification. Participant will state understanding/return demonstration of topics presented as noted by education test scores.  Learning Barriers/Preferences:  Learning Barriers/Preferences - 12/12/20 0752       Learning Barriers/Preferences   Learning Barriers None    Learning Preferences None             General Cardiac Education Topics:  AED/CPR: - Group verbal and written instruction with the use of models  to demonstrate the basic use of the AED with the basic ABC's of resuscitation.   Anatomy and Cardiac Procedures: - Group verbal and visual presentation and models provide information about basic cardiac anatomy and function. Reviews the testing methods done to diagnose heart disease and the outcomes of the test results. Describes the treatment choices: Medical Management, Angioplasty, or Coronary Bypass Surgery for treating various heart conditions including Myocardial Infarction, Angina, Valve Disease, and Cardiac Arrhythmias.  Written material given at graduation. Flowsheet Row Cardiac Rehab from 09/13/2020 in [facility] Cardiac and Pulmonary Rehab  Education need identified 07/12/20  Date 08/16/20  Educator SB  Instruction Review Code 1- Verbalizes Understanding       Medication Safety: - Group verbal and visual instruction to review commonly prescribed medications for heart and lung disease. Reviews the medication, class of the drug, and side effects. Includes the steps to properly store meds and maintain the prescription regimen.  Written material given at graduation. Flowsheet Row Cardiac Rehab from 01/17/2021 in [facility] Cardiac and Pulmonary Rehab  Date 01/03/21  Educator SB  Instruction Review Code 1- Verbalizes Understanding       Intimacy: - Group verbal instruction through game format to discuss  how heart and lung disease can affect sexual intimacy. Written material given at graduation.. Flowsheet Row Cardiac Rehab from 09/13/2020 in [facility] Cardiac and Pulmonary Rehab  Date 08/09/20  Educator AS  Instruction Review Code 1- Verbalizes Understanding       Know Your Numbers and Heart Failure: - Group verbal and visual instruction to discuss disease risk factors for cardiac and pulmonary disease and treatment options.  Reviews associated critical values for Overweight/Obesity, Hypertension, Cholesterol, and Diabetes.  Discusses basics of heart failure: signs/symptoms and treatments.  Introduces Heart Failure Zone chart for action plan for heart failure.  Written material given at graduation. Flowsheet Row Cardiac Rehab from 01/17/2021 in [facility] Cardiac and Pulmonary Rehab  Date 01/17/21  Educator KB  Instruction Review Code 1- Verbalizes Understanding       Infection Prevention: - Provides verbal and written material to individual with discussion of infection control including proper hand washing and proper equipment cleaning during exercise session. Flowsheet Row Cardiac Rehab from 01/17/2021 in [facility] Cardiac and Pulmonary Rehab  Date 12/12/20  Educator [facility]  Instruction Review Code 1- Verbalizes Understanding       Falls Prevention: - Provides verbal and written material to individual with discussion of falls prevention and safety. Flowsheet Row Cardiac Rehab from 01/17/2021 in [facility] Cardiac and Pulmonary Rehab  Date 12/12/20  Educator [facility]  Instruction Review Code 1- Verbalizes Understanding       Other: -Provides group and verbal instruction on various topics (see comments)   Knowledge Questionnaire Score:  Knowledge Questionnaire Score - 12/12/20 0902       Knowledge Questionnaire Score   Pre Score 21/26             Core Components/Risk Factors/Patient Goals at Admission:  Personal Goals and Risk Factors at Admission - 12/12/20 0754       Core Components/Risk  Factors/Patient Goals on Admission    Weight Management Yes;Weight Loss    Intervention Weight Management: Develop a combined nutrition and exercise program designed to reach desired caloric intake, while maintaining appropriate intake of nutrient and fiber, sodium and fats, and appropriate energy expenditure required for the weight goal.;Weight Management: Provide education and appropriate resources to help participant work on and attain dietary goals.;Weight Management/Obesity: Establish reasonable short term and long term weight  goals.    Admit Weight 208 lb (94.3 kg)    Goal Weight: Short Term 200 lb (90.7 kg)    Goal Weight: Long Term 190 lb (86.2 kg)    Expected Outcomes Short Term: Continue to assess and modify interventions until short term weight is achieved;Long Term: Adherence to nutrition and physical activity/exercise program aimed toward attainment of established weight goal;Weight Loss: Understanding of general recommendations for a balanced deficit meal plan, which promotes 1-2 lb weight loss per week and includes a negative energy balance of [phone] kcal/d;Understanding recommendations for meals to include 15-35% energy as protein, 25-35% energy from fat, 35-60% energy from carbohydrates, less than 267m of dietary cholesterol, 20-35 gm of total fiber daily;Understanding of distribution of calorie intake throughout the day with the consumption of 4-5 meals/snacks    Heart Failure Yes    Intervention Provide a combined exercise and nutrition program that is supplemented with education, support and counseling about heart failure. Directed toward relieving symptoms such as shortness of breath, decreased exercise tolerance, and extremity edema.    Expected Outcomes Improve functional capacity of life;Short term: Attendance in program 2-3 days a week with increased exercise capacity. Reported lower sodium intake. Reported increased fruit and vegetable intake. Reports medication  compliance.;Short term: Daily weights obtained and reported for increase. Utilizing diuretic protocols set by physician.;Long term: Adoption of self-care skills and reduction of barriers for early signs and symptoms recognition and intervention leading to self-care maintenance.    Hypertension Yes    Intervention Provide education on lifestyle modifcations including regular physical activity/exercise, weight management, moderate sodium restriction and increased consumption of fresh fruit, vegetables, and low fat dairy, alcohol moderation, and smoking cessation.;Monitor prescription use compliance.    Expected Outcomes Short Term: Continued assessment and intervention until BP is < 140/918mHG in hypertensive participants. < 130/8051mG in hypertensive participants with diabetes, heart failure or chronic kidney disease.;Long Term: Maintenance of blood pressure at goal levels.    Lipids Yes    Intervention Provide education and support for participant on nutrition & aerobic/resistive exercise along with prescribed medications to achieve LDL <42m37mDL >40mg66m Expected Outcomes Short Term: Participant states understanding of desired cholesterol values and is compliant with medications prescribed. Participant is following exercise prescription and nutrition guidelines.;Long Term: Cholesterol controlled with medications as prescribed, with individualized exercise RX and with personalized nutrition plan. Value goals: LDL < 42mg,33m > 40 mg.             Education:Diabetes - Individual verbal and written instruction to review signs/symptoms of diabetes, desired ranges of glucose level fasting, after meals and with exercise. Acknowledge that pre and post exercise glucose checks will be done for 3 sessions at entry of program.   Core Components/Risk Factors/Patient Goals Review:   Goals and Risk Factor Review     Row Name 01/05/21 0930             Core Components/Risk Factors/Patient Goals Review    Personal Goals Review Weight Management/Obesity;Heart Failure;Hypertension;Lipids       Review [name] Willie Browing well in rehab. His weight fluctuates some every day. He weighs daily in the morning and takes his meds.  His pressures have been in the 140s/50s recently.  He does still have some swelling in stomach, legs and feet.  He did take some extra lasix to help and it is better today.       Expected Outcomes Short: Continue to keep close eye on weight Long:  Continue to manage heart failure.                Core Components/Risk Factors/Patient Goals at Discharge (Final Review):   Goals and Risk Factor Review - 01/05/21 0930       Core Components/Risk Factors/Patient Goals Review   Personal Goals Review Weight Management/Obesity;Heart Failure;Hypertension;Lipids    Review Willie Clark is doing well in rehab. His weight fluctuates some every day. He weighs daily in the morning and takes his meds.  His pressures have been in the 140s/50s recently.  He does still have some swelling in stomach, legs and feet.  He did take some extra lasix to help and it is better today.    Expected Outcomes Short: Continue to keep close eye on weight Long: Continue to manage heart failure.             ITP Comments:  ITP Comments     Row Name 12/12/20 [phone] 12/13/20 0926 12/27/20 0748 01/24/21 1412     ITP Comments Updated and reviewed medical record for new stent on 11/15/20.  Documentation for diagnosis can be found in [facility] note from 11/15/20.  Completed 6MWT and gym orientation. Initial ITP created and sent for review to Dr. Emily Filbert, Medical Director. First full day of exercise!  Patient was oriented to gym and equipment including functions, settings, policies, and procedures.  Patient's individual exercise prescription and treatment plan were reviewed.  All starting workloads were established based on the results of the 6 minute walk test done at initial orientation visit.  The plan for exercise progression was  also introduced and progression will be customized based on patient's performance and goals. 30 Day review completed. Medical Director ITP review done, changes made as directed, and signed approval by Medical Director. 30 day review completed. ITP sent to Dr. Emily Filbert, Medical Director of Cardiac Rehab. Continue with ITP unless changes are made by physician.             Comments: 30 day review

## 2021-01-24 NOTE — Progress Notes (Signed)
Daily Session Note  Patient Details  Name: Willie Clark MRN: [id number] Date of Birth: 1951/04/15 Referring Provider:   Flowsheet Row Cardiac Rehab from 12/12/2020 in [facility] Cardiac and Pulmonary Rehab  Referring Provider End, Harrell Gave MD       Encounter Date: 01/24/2021  Check In:  Session Check In - 01/24/21 0923       Check-In   Supervising physician immediately available to respond to emergencies See telemetry face sheet for immediately available ER MD    Location [facility]-Cardiac & Pulmonary Rehab    Staff Present Birdie Sons, MPA, RN;Melissa Leonard, RDN, Rowe Pavy, BA, ACSM CEP, Exercise Physiologist    Virtual Visit No    Medication changes reported     No    Fall or balance concerns reported    No    Tobacco Cessation No Change    Warm-up and Cool-down Performed on first and last piece of equipment    Resistance Training Performed Yes    VAD Patient? No    PAD/SET Patient? No      Pain Assessment   Currently in Pain? No/denies                Social History   Tobacco Use  Smoking Status Former   Packs/day: 2.50   Types: Cigarettes   Quit date: 08/21/1991   Years since quitting: 29.4  Smokeless Tobacco Never    Goals Met:  Independence with exercise equipment Exercise tolerated well No report of concerns or symptoms today Strength training completed today  Goals Unmet:  Not Applicable  Comments: Pt able to follow exercise prescription today without complaint.  Will continue to monitor for progression.    Dr. Emily Filbert is Medical Director for [place].  Dr. Ottie Glazier is Medical Director for [facility] Pulmonary Rehabilitation.

## 2021-01-26 ENCOUNTER — Encounter: Payer: Medicare HMO | Admitting: *Deleted

## 2021-01-26 ENCOUNTER — Other Ambulatory Visit: Payer: Self-pay

## 2021-01-26 DIAGNOSIS — Z48812 Encounter for surgical aftercare following surgery on the circulatory system: Secondary | ICD-10-CM | POA: Diagnosis not present

## 2021-01-26 DIAGNOSIS — I214 Non-ST elevation (NSTEMI) myocardial infarction: Secondary | ICD-10-CM

## 2021-01-26 DIAGNOSIS — Z955 Presence of coronary angioplasty implant and graft: Secondary | ICD-10-CM

## 2021-01-26 NOTE — Progress Notes (Signed)
Daily Session Note  Patient Details  Name: Willie Clark MRN: [id number] Date of Birth: May 30, 1950 Referring Provider:   Flowsheet Row Cardiac Rehab from 12/12/2020 in [facility] Cardiac and Pulmonary Rehab  Referring Provider End, Harrell Gave MD       Encounter Date: 01/26/2021  Check In:  Session Check In - 01/26/21 0912       Check-In   Supervising physician immediately available to respond to emergencies See telemetry face sheet for immediately available ER MD    Location [facility]-Cardiac & Pulmonary Rehab    Staff Present Renita Papa, RN BSN;Joseph [place], RCP,RRT,BSRT;Jessica Myrtle, [place], RCEP, CCRP, CCET    Virtual Visit No    Medication changes reported     Yes    Comments added Mesalamine    Fall or balance concerns reported    No    Warm-up and Cool-down Performed on first and last piece of equipment    Resistance Training Performed Yes    VAD Patient? No    PAD/SET Patient? No      Pain Assessment   Currently in Pain? No/denies                Social History   Tobacco Use  Smoking Status Former   Packs/day: 2.50   Types: Cigarettes   Quit date: 08/21/1991   Years since quitting: 29.4  Smokeless Tobacco Never    Goals Met:  Independence with exercise equipment Exercise tolerated well No report of concerns or symptoms today Strength training completed today  Goals Unmet:  Not Applicable  Comments: Pt able to follow exercise prescription today without complaint.  Will continue to monitor for progression.'    Dr. Emily Filbert is Medical Director for [place].  Dr. Ottie Glazier is Medical Director for [facility] Pulmonary Rehabilitation.

## 2021-01-26 NOTE — Progress Notes (Signed)
Patient ID: Willie Clark, male    DOB: 26-Apr-1950, 70 y.o.   MRN: [id number]  HPI  Willie Clark is a 70 y/o male with a history of CAD, hyperlipidemia, HTN, stroke, anxiety, depression, GERD, obstructive sleep apnea, PE, remote tobacco use and chronic heart failure.   Echo report from 11/15/20 reviewed and showed an EF of 40-45% along with mild LAE, mild Willie and normal PA pressure of 24.9 mmHg.   LHC/RHC completed 11/15/20 and showed: Severe three-vessel coronary artery disease including chronic total occlusions of the ostial LAD, OM1, and proximal RCA. Patent LCx stent with moderate in-stent restenosis (~50%), similar to prior catheterizations. Widely patent LIMA to LAD. Patent SVG to RPDA with patent proximal/mid graft stents that demonstrate mild in-stent restenosis (~10%).  Distal anastomotic stent is widely patent.  There is significant ectasia in the midportion of the graft. Patent SVG to OM with sequential 75% and 60% ostial and mid graft stenoses. Upper normal to mildly elevated left and right heart filling pressures. Normal pulmonary artery pressure. Mildly reduced Fick cardiac output/index. Successful PCI to SVG to OM with nonoverlapping Resolute Onyx 3.0 x 12 mm (ostial) and 3.0 x 15 mm (mid graft) drug-eluting stents with 0% residual stenosis and TIMI-3 flow.  Admitted 11/14/20 due to chest pain and shortness of breath. Cardiology consult obtained & cath completed. Diuretic changed. Discharged after 2 days.   He presents today for a follow-up visit with a chief complaint of moderate fatigue with minimal exertion. He describes this as chronic in nature having been present for several months. He has associated cough, shortness of breath, intermittent chest pain ("nothing different"), pedal edema, abdominal pain (diverticulitis), light-headedness & difficulty sleeping along with this. He denies any abdominal distention, palpitations or weight gain.   Continues to weigh himself daily. Not  adding salt to his food but did eat a church even 3 days ago and they served soup and salad. Has been more active this past weekend.   Still attends cardiac rehab 3 days/ week. Wears his compression socks when he wears long pants.   No idea how much fluid he drinks / day.   Past Medical History:  Diagnosis Date   Anxiety    Arthritis    Barrett's esophagus    Bowel obstruction ([place]) 02/2009   small bowel   CHF (congestive heart failure) (HCC)    Chronic chest pain    Coronary artery disease    a. 1993 CABG; b. 2007 Redo CABG; c. 09/2015 Cath: LM mild dzs, LAD 100ost, RI mild dzs, LCX 60p, OM1 100, RCA 100ost, VG->OM2 min irregs, LIMA->LAD nl, VG->RPDA 30ost, 20d, nl EF->Med rx.   Depression    Diverticulitis    Diverticulosis    Erectile dysfunction    Fibromyalgia    Gastric ulcer    Gastritis    GERD (gastroesophageal reflux disease)    Headache    Hemorrhoids    HFrEF (heart failure with reduced ejection fraction) ([place])    a. 09/2016 Echo: EF 55-60%, no rwma, triv AI, mild Willie, mildly dil LA. -> as of April 2020: EF 40 to 45%.  Septal dyssynergy/hypokinesis due to postop state but otherwise unable to assess wall motion [name] due to poor study.   History of myocardial infarct at age less than 66 years    Hyperlipidemia    Hypertension    Kidney stones    going to [facility]  June 26 to see kidney specialist   Lightheadedness  Mass on back    Nephrolithiasis    Orthostatic hypotension    a. Improved after discontinuation of metoprolol.   OSA (obstructive sleep apnea)    a. On CPAP.   Pulmonary embolism ([place]) 01/2006   Post-op/treated   Skin lesions, generalized    Stroke [facility]) 1993   right side weakness no blood thinners   on Aspirin   Syncope and collapse    Urticaria    Past Surgical History:  Procedure Laterality Date   BACK SURGERY     bowel obstruction  01/2009   CARDIAC CATHETERIZATION  11/2006   patent grafts. Significant OM3 disease and occluded  diagonals.   CARDIAC CATHETERIZATION  06/2010   patent grafts. significant ISR in proximal LCX but OM2 is bypassed and gives retrograde flow to OM3.    CARDIAC CATHETERIZATION  06/2013   [facility]: Patent grafts with 60% proximal in-stent restenosis in the left circumflex with FFR of 0.85   CARDIAC CATHETERIZATION Left 09/25/2015   Procedure: Left Heart Cath and Cors/Grafts Angiography;  Surgeon: Wellington Hampshire, MD;  Location: [place] CV LAB;  Service: Cardiovascular;  Laterality: Left;   CERVICAL FUSION     CHOLECYSTECTOMY     COLON SURGERY  01/2009   BOWEL RESECTION DUE TO SMALL BOWEL OBSTRUCTION   COLONOSCOPY     COLONOSCOPY WITH PROPOFOL N/A 10/28/2017   Procedure: COLONOSCOPY WITH PROPOFOL;  Surgeon: Lollie Sails, MD;  Location: [facility] ENDOSCOPY;  Service: Endoscopy;  Laterality: N/A;   CORONARY ANGIOPLASTY  2006   CORONARY ARTERY BYPASS GRAFT  1993/01/2006   redo at [facility]. LIMA to LAD, SVG to OM2 and SVG to RPDA   CORONARY STENT INTERVENTION N/A 06/20/2020   Procedure: CORONARY STENT INTERVENTION;  Surgeon: Nelva Bush, MD;  Location: [place] [facility] LAB;  Service: Cardiovascular;  Laterality: N/A;   CORONARY STENT INTERVENTION N/A 11/15/2020   Procedure: CORONARY STENT INTERVENTION;  Surgeon: Nelva Bush, MD;  Location: [place] CV LAB;  Service: Cardiovascular;  Laterality: N/A;   CYSTOSCOPY WITH STENT PLACEMENT Bilateral 09/24/2015   Procedure: CYSTOSCOPY WITH BILATERAL RETROGRADES, BILATERAL STENT PLACEMENT;  Surgeon: Festus Aloe, MD;  Location: [facility] ORS;  Service: Urology;  Laterality: Bilateral;   ESOPHAGOGASTRODUODENOSCOPY N/A 10/25/2014   Procedure: ESOPHAGOGASTRODUODENOSCOPY (EGD);  Surgeon: Lollie Sails, MD;  Location: [facility] ENDOSCOPY;  Service: Endoscopy;  Laterality: N/A;   ESOPHAGOGASTRODUODENOSCOPY (EGD) WITH PROPOFOL N/A 02/03/2017   Procedure: ESOPHAGOGASTRODUODENOSCOPY (EGD) WITH PROPOFOL;  Surgeon: Lollie Sails, MD;  Location: [facility] ENDOSCOPY;   Service: Endoscopy;  Laterality: N/A;   ESOPHAGOGASTRODUODENOSCOPY (EGD) WITH PROPOFOL N/A 04/18/2017   Procedure: ESOPHAGOGASTRODUODENOSCOPY (EGD) WITH PROPOFOL;  Surgeon: Lollie Sails, MD;  Location: [facility] ENDOSCOPY;  Service: Endoscopy;  Laterality: N/A;   EYE SURGERY Bilateral    Cataract Extraction with IOL   HERNIA REPAIR     INGUINAL HERNIA REPAIR Bilateral 01/23/2016   Procedure: HERNIA REPAIR INGUINAL ADULT BILATERAL;  Surgeon: Leonie Green, MD;  Location: [facility] ORS;  Service: General;  Laterality: Bilateral;   LEFT HEART CATH AND CORS/GRAFTS ANGIOGRAPHY N/A 12/13/2019   Procedure: LEFT HEART CATH AND CORS/GRAFTS ANGIOGRAPHY;  Surgeon: Wellington Hampshire, MD;  Location: [place] [facility] LAB;  Service: Cardiovascular;  Laterality: N/A;   LEFT HEART CATH AND CORS/GRAFTS ANGIOGRAPHY N/A 06/20/2020   Procedure: LEFT HEART CATH AND CORS/GRAFTS ANGIOGRAPHY;  Surgeon: Nelva Bush, MD;  Location: [place] [facility] LAB;  Service: Cardiovascular;  Laterality: N/A;   LUMBAR LAMINECTOMY/DECOMPRESSION MICRODISCECTOMY Left 10/01/2016   Procedure: Left  Lumbar four-five Laminotomy for resection of synovial cyst;  Surgeon: Erline Levine, MD;  Location: [place];  Service: Neurosurgery;  Laterality: Left;  left   NECK SURGERY  06/2009   RIGHT/LEFT HEART CATH AND CORONARY/GRAFT ANGIOGRAPHY N/A 11/15/2020   Procedure: RIGHT/LEFT HEART CATH AND CORONARY/GRAFT ANGIOGRAPHY;  Surgeon: Nelva Bush, MD;  Location: [place] CV LAB;  Service: Cardiovascular;  Laterality: N/A;   SHOULDER SURGERY  2010   Family History  Problem Relation Age of Onset   Heart disease Father 99   Heart attack Father    Social History   Tobacco Use   Smoking status: Former    Packs/day: 2.50    Types: Cigarettes    Quit date: 08/21/1991    Years since quitting: 29.4   Smokeless tobacco: Never  Substance Use Topics   Alcohol use: Not Currently   Allergies  Allergen Reactions   Amitriptyline Other (See  Comments)    Pt unsure of what happens Other reaction(s): Other (See Comments) Pt unsure of what happens Pt unsure of what happens   Cyclobenzaprine Other (See Comments) and Hives    Pt unsure what hapens Pt unsure what hapens Pt unsure what hapens    Dicyclomine Shortness Of Breath and Swelling   Sacubitril-Valsartan Anaphylaxis and Itching   Amitriptyline Hcl    Berniece Salines Flavor Diarrhea   Hydrocodone    Fish Oil Itching   Lidocaine Nausea And Vomiting, Other (See Comments) and Nausea Only    Nasal Spray (only time had reaction) Other reaction(s): Dizziness Nasal Spray (only time had reaction)   Omega-3 Fatty Acids-Vitamin E Itching   Other Diarrhea and Itching    Reactive agents: Onions, bacon, steak   Oxycodone-Acetaminophen Other (See Comments)    Lowers BP Other reaction(s): Other (See Comments) Lowers BP Lowers BP   Sulfa Antibiotics Itching   Prior to Admission medications   Medication Sig Start Date End Date Taking? Authorizing Provider  albuterol (PROVENTIL HFA;VENTOLIN HFA) 108 (90 Base) MCG/ACT inhaler Inhale into the lungs every 4 (four) hours as needed for wheezing or shortness of breath.   Yes [provider]  aspirin EC 81 MG EC tablet Take 1 tablet (81 mg total) by mouth daily. Swallow whole. 06/23/20  Yes Nolberto Hanlon, MD  carvedilol (COREG) 3.125 MG tablet TAKE 1 TABLET (3.125 MG TOTAL) BY MOUTH 2 (TWO) TIMES DAILY WITH A MEAL. 12/28/20  Yes Wellington Hampshire, MD  citalopram (CELEXA) 20 MG tablet Take 20 mg by mouth daily.    Yes [provider]  clopidogrel (PLAVIX) 75 MG tablet TAKE (4 TABLETS) 300 MG STARTING FRI 12/08/20, THEN STARTING THE NEXT DAY TAKE (1) TABLET 75 MG DAILY. 12/19/20  Yes Wellington Hampshire, MD  docusate calcium (SURFAK) 240 MG capsule Take 240 mg by mouth daily.   Yes [provider]  empagliflozin (JARDIANCE) 10 MG TABS tablet Take 1 tablet (10 mg total) by mouth daily before breakfast. 12/19/20  Yes [name] A,  MD  EPINEPHrine 0.3 mg/0.3 mL IJ SOAJ injection Inject 0.3 mg into the muscle as needed.  12/27/19  Yes [provider]  fexofenadine (ALLEGRA) 180 MG tablet Take 180 mg by mouth daily.   Yes [provider]  finasteride (PROSCAR) 5 MG tablet Take 5 mg by mouth daily.   Yes [provider]  gabapentin (NEURONTIN) 800 MG tablet Take 1 tablet by mouth 3 (three) times daily. 12/22/19  Yes [provider]  isosorbide mononitrate (IMDUR) 60 MG 24 hr  tablet Take 1 tablet (60 mg total) by mouth daily. 11/06/20  Yes Furth, Cadence H, PA-C  losartan (COZAAR) 25 MG tablet TAKE 1 TABLET EVERY DAY 10/05/20  Yes Wellington Hampshire, MD  mesalamine (APRISO) 0.375 g 24 hr capsule Take 1.4 mg by mouth daily.   Yes [provider]  Multiple Vitamins-Minerals (HAIR SKIN AND NAILS FORMULA PO) Take 1 capsule by mouth daily.   Yes [provider]  NALTREXONE HCL PO Take 2.5 mg by mouth daily.   Yes [provider]  nitroGLYCERIN (NITROSTAT) 0.4 MG SL tablet Place 1 tablet (0.4 mg total) under the tongue every 5 (five) minutes as needed for chest pain. 06/22/20  Yes Nolberto Hanlon, MD  ondansetron (ZOFRAN) 4 MG tablet Take 4 mg by mouth every 8 (eight) hours as needed for nausea or vomiting.   Yes [provider]  pantoprazole (PROTONIX) 40 MG tablet Take 40 mg by mouth 2 (two) times daily.    Yes [provider]  Peppermint Oil (IBGARD PO) Take by mouth daily at 2 am.   Yes [provider]  potassium chloride SA (KLOR-CON) 20 MEQ tablet Take 1 tablet (20 mEq total) by mouth daily. 12/05/20  Yes Wellington Hampshire, MD  Probiotic Product (PROBIOTIC PO) Take by mouth daily at 8 pm.   Yes [provider]  rOPINIRole (REQUIP) 1 MG tablet Take 1 mg by mouth 2 (two) times daily. 05/18/20  Yes [provider]  rosuvastatin (CRESTOR) 40 MG tablet TAKE 1 TABLET BY MOUTH EVERY DAY 12/18/20  Yes Loel Dubonnet, NP  spironolactone  (ALDACTONE) 25 MG tablet TAKE 1 TABLET EVERY DAY 12/28/20  Yes Wellington Hampshire, MD  torsemide (DEMADEX) 20 MG tablet Take 2 tablets (40 mg total) by mouth daily. 12/21/20  Yes Wellington Hampshire, MD  VASCEPA 1 g capsule TAKE 2 CAPSULES TWICE DAILY 07/12/20  Yes Wellington Hampshire, MD  vitamin B-12 (CYANOCOBALAMIN) 1000 MCG tablet Take 1,000 mcg by mouth daily.   Yes [provider]  traMADol (ULTRAM) 50 MG tablet Take 50 mg by mouth every 6 (six) hours as needed. 11/07/20   [provider]    Review of Systems  Constitutional:  Positive for fatigue (easily). Negative for appetite change.  Eyes: Negative.   Respiratory:  Positive for cough and shortness of breath (easily).   Cardiovascular:  Positive for chest pain (at times) and leg swelling. Negative for palpitations.  Gastrointestinal:  Positive for abdominal pain ("improving"). Negative for abdominal distention.  Endocrine: Negative.   Genitourinary: Negative.   Musculoskeletal:  Positive for arthralgias (right leg).  Skin: Negative.   Allergic/Immunologic: Negative.   Neurological:  Positive for light-headedness (at times). Negative for dizziness.  Hematological:  Negative for adenopathy. Bruises/bleeds easily.  Psychiatric/Behavioral:  Positive for sleep disturbance (difficulty sleeping due to pain). Negative for dysphoric mood. The patient is not nervous/anxious.    Vitals:   01/29/21 0834  BP: 114/63  Pulse: 62  Resp: 18  SpO2: 100%  Weight: 207 lb (93.9 kg)  Height: 5\' 8"  (1.727 m)   Wt Readings from Last 3 Encounters:  01/29/21 207 lb (93.9 kg)  12/12/20 208 lb (94.3 kg)  12/05/20 200 lb (90.7 kg)   Lab Results  Component Value Date   CREATININE 0.99 11/30/2020   CREATININE 0.90 11/16/2020   CREATININE 1.07 11/14/2020   Physical Exam Vitals and nursing note reviewed. Exam conducted with a chaperone present (wife).  Constitutional:  Appearance: Normal appearance.  HENT:     Head: Normocephalic  and atraumatic.  Cardiovascular:     Rate and Rhythm: Normal rate and regular rhythm.  Pulmonary:     Effort: Pulmonary effort is normal. No respiratory distress.     Breath sounds: No wheezing, rhonchi or rales.  Abdominal:     General: There is no distension.     Palpations: Abdomen is soft.  Musculoskeletal:        General: No tenderness.     Cervical back: Normal range of motion and neck supple.     Right lower leg: Edema (1+ pitting) present.     Left lower leg: Edema (1+ pitting) present.  Skin:    General: Skin is warm and dry.  Neurological:     General: No focal deficit present.     Mental Status: He is alert and oriented to person, place, and time.  Psychiatric:        Mood and Affect: Mood normal.        Behavior: Behavior normal.        Thought Content: Thought content normal.    Assessment & Plan:  1: Chronic heart failure with reduced ejection fraction- - NYHA class III - euvolemic today - weighing daily; reminded to call for an overnight weight gain of > 2 pounds or a weekly weight gain of > 5 pounds - weight up 6 pounds from last visit here 2 months ago - instructed to take an additional torsemide/ potassium if he has above weight gain but to note how often he does this - not adding salt and is reading food labels for sodium content; did eat at a church function 3 days ago and had soup & salad - he doesn't know how much fluid he drinks daily so advised him to drink between 60-64 ounces of fluid daily - saw cardiology [place] Anon) 12/05/20; returns November  - on GDMT of carvedilol, farxiga, losartan & spironolactone - had severe itching/ rash when previously tried on entresto - participating in cardiac rehab 3 days/ week - encouraged to wear the compression socks every day with elevation of his legs during the day; he says that usually only wears the compression socks when he wears long pants - he has not taken his diuretic yet today but will do so as soon as he gets  home - BNP 11/14/20 was 20.8  2: HTN- - BP looks good (114/63) - follows with PCP at [place]  - BMP 11/30/20 reviewed and showed sodium 136, potassium 3.7, creatinine 0.99 & GFR > 60  3: Obstructive sleep apnea- - wearing his CPAP nightly  4: Chest pain/ tightness- - doesn't feel like it's any worse or any different than previously - EKG done in the office is unchanged from previous ones - advised him to contact cardiology if his angina pattern changes - having abdominal pain due to diverticulitis   Medication bottles reviewed.   Return in 6 months or sooner for any questions/problems before then.

## 2021-01-29 ENCOUNTER — Other Ambulatory Visit: Payer: Self-pay

## 2021-01-29 ENCOUNTER — Encounter: Payer: Self-pay | Admitting: Family

## 2021-01-29 ENCOUNTER — Ambulatory Visit: Payer: Medicare HMO | Attending: Family | Admitting: Family

## 2021-01-29 VITALS — BP 114/63 | HR 62 | Resp 18 | Ht 68.0 in | Wt 207.0 lb

## 2021-01-29 DIAGNOSIS — Z885 Allergy status to narcotic agent status: Secondary | ICD-10-CM | POA: Insufficient documentation

## 2021-01-29 DIAGNOSIS — I10 Essential (primary) hypertension: Secondary | ICD-10-CM | POA: Diagnosis not present

## 2021-01-29 DIAGNOSIS — Z8673 Personal history of transient ischemic attack (TIA), and cerebral infarction without residual deficits: Secondary | ICD-10-CM | POA: Insufficient documentation

## 2021-01-29 DIAGNOSIS — Z9049 Acquired absence of other specified parts of digestive tract: Secondary | ICD-10-CM | POA: Insufficient documentation

## 2021-01-29 DIAGNOSIS — K219 Gastro-esophageal reflux disease without esophagitis: Secondary | ICD-10-CM | POA: Insufficient documentation

## 2021-01-29 DIAGNOSIS — K5792 Diverticulitis of intestine, part unspecified, without perforation or abscess without bleeding: Secondary | ICD-10-CM | POA: Insufficient documentation

## 2021-01-29 DIAGNOSIS — Z7982 Long term (current) use of aspirin: Secondary | ICD-10-CM | POA: Diagnosis not present

## 2021-01-29 DIAGNOSIS — Z882 Allergy status to sulfonamides status: Secondary | ICD-10-CM | POA: Insufficient documentation

## 2021-01-29 DIAGNOSIS — Z951 Presence of aortocoronary bypass graft: Secondary | ICD-10-CM | POA: Insufficient documentation

## 2021-01-29 DIAGNOSIS — Z955 Presence of coronary angioplasty implant and graft: Secondary | ICD-10-CM | POA: Diagnosis not present

## 2021-01-29 DIAGNOSIS — I2510 Atherosclerotic heart disease of native coronary artery without angina pectoris: Secondary | ICD-10-CM | POA: Diagnosis not present

## 2021-01-29 DIAGNOSIS — I252 Old myocardial infarction: Secondary | ICD-10-CM | POA: Insufficient documentation

## 2021-01-29 DIAGNOSIS — E785 Hyperlipidemia, unspecified: Secondary | ICD-10-CM | POA: Diagnosis not present

## 2021-01-29 DIAGNOSIS — F32A Depression, unspecified: Secondary | ICD-10-CM | POA: Diagnosis not present

## 2021-01-29 DIAGNOSIS — I110 Hypertensive heart disease with heart failure: Secondary | ICD-10-CM | POA: Diagnosis not present

## 2021-01-29 DIAGNOSIS — Z8249 Family history of ischemic heart disease and other diseases of the circulatory system: Secondary | ICD-10-CM | POA: Insufficient documentation

## 2021-01-29 DIAGNOSIS — I5022 Chronic systolic (congestive) heart failure: Secondary | ICD-10-CM

## 2021-01-29 DIAGNOSIS — G4733 Obstructive sleep apnea (adult) (pediatric): Secondary | ICD-10-CM

## 2021-01-29 DIAGNOSIS — F419 Anxiety disorder, unspecified: Secondary | ICD-10-CM | POA: Insufficient documentation

## 2021-01-29 DIAGNOSIS — Z9989 Dependence on other enabling machines and devices: Secondary | ICD-10-CM | POA: Diagnosis not present

## 2021-01-29 DIAGNOSIS — Z888 Allergy status to other drugs, medicaments and biological substances status: Secondary | ICD-10-CM | POA: Diagnosis not present

## 2021-01-29 DIAGNOSIS — Z86711 Personal history of pulmonary embolism: Secondary | ICD-10-CM | POA: Insufficient documentation

## 2021-01-29 DIAGNOSIS — R0789 Other chest pain: Secondary | ICD-10-CM | POA: Insufficient documentation

## 2021-01-29 DIAGNOSIS — Z87891 Personal history of nicotine dependence: Secondary | ICD-10-CM | POA: Diagnosis not present

## 2021-01-29 DIAGNOSIS — Z79899 Other long term (current) drug therapy: Secondary | ICD-10-CM | POA: Diagnosis not present

## 2021-01-29 DIAGNOSIS — Z7902 Long term (current) use of antithrombotics/antiplatelets: Secondary | ICD-10-CM | POA: Insufficient documentation

## 2021-01-29 NOTE — Patient Instructions (Addendum)
Continue weighing daily and call for an overnight weight gain of > 2 pounds or a weekly weight gain of >5 pounds.    Drink between 60-64 ounces of fluid daily.    If you have above weight gain, take an additional torsemide/ potassium.

## 2021-01-31 ENCOUNTER — Other Ambulatory Visit: Payer: Self-pay

## 2021-01-31 DIAGNOSIS — I214 Non-ST elevation (NSTEMI) myocardial infarction: Secondary | ICD-10-CM

## 2021-01-31 DIAGNOSIS — Z955 Presence of coronary angioplasty implant and graft: Secondary | ICD-10-CM

## 2021-01-31 DIAGNOSIS — Z48812 Encounter for surgical aftercare following surgery on the circulatory system: Secondary | ICD-10-CM | POA: Diagnosis not present

## 2021-01-31 NOTE — Progress Notes (Signed)
Daily Session Note  Patient Details  Name: Willie Clark MRN: [id number] Date of Birth: 31-Mar-1951 Referring Provider:   Flowsheet Row Cardiac Rehab from 12/12/2020 in [facility] Cardiac and Pulmonary Rehab  Referring Provider End, Harrell Gave MD       Encounter Date: 01/31/2021  Check In:  Session Check In - 01/31/21 0933       Check-In   Supervising physician immediately available to respond to emergencies See telemetry face sheet for immediately available ER MD    Location [facility]-Cardiac & Pulmonary Rehab    Staff Present Birdie Sons, MPA, Elveria Rising, BA, ACSM CEP, Exercise Physiologist;Joseph Tessie Fass, [place]    Virtual Visit No    Medication changes reported     No    Fall or balance concerns reported    No    Tobacco Cessation No Change    Warm-up and Cool-down Performed on first and last piece of equipment    Resistance Training Performed Yes    VAD Patient? No    PAD/SET Patient? No      Pain Assessment   Currently in Pain? No/denies                Social History   Tobacco Use  Smoking Status Former   Packs/day: 2.50   Types: Cigarettes   Quit date: 08/21/1991   Years since quitting: 29.4  Smokeless Tobacco Never    Goals Met:  Independence with exercise equipment Exercise tolerated well No report of concerns or symptoms today Strength training completed today  Goals Unmet:  Not Applicable  Comments: Pt able to follow exercise prescription today without complaint.  Will continue to monitor for progression.    Dr. Emily Filbert is Medical Director for [place].  Dr. Ottie Glazier is Medical Director for [facility] Pulmonary Rehabilitation.

## 2021-02-02 ENCOUNTER — Other Ambulatory Visit: Payer: Self-pay

## 2021-02-02 DIAGNOSIS — Z955 Presence of coronary angioplasty implant and graft: Secondary | ICD-10-CM

## 2021-02-02 DIAGNOSIS — I214 Non-ST elevation (NSTEMI) myocardial infarction: Secondary | ICD-10-CM

## 2021-02-02 DIAGNOSIS — Z48812 Encounter for surgical aftercare following surgery on the circulatory system: Secondary | ICD-10-CM | POA: Diagnosis not present

## 2021-02-02 NOTE — Progress Notes (Signed)
Daily Session Note  Patient Details  Name: Willie Clark MRN: [id number] Date of Birth: Jan 05, 1951 Referring Provider:   Flowsheet Row Cardiac Rehab from 12/12/2020 in [facility] Cardiac and Pulmonary Rehab  Referring Provider End, Harrell Gave MD       Encounter Date: 02/02/2021  Check In:  Session Check In - 02/02/21 0916       Check-In   Supervising physician immediately available to respond to emergencies See telemetry face sheet for immediately available ER MD    Location [facility]-Cardiac & Pulmonary Rehab    Staff Present Birdie Sons, MPA, RN;Jessica [place], MA, RCEP, CCRP, CCET;Joseph [place], [place]    Virtual Visit No    Medication changes reported     No    Fall or balance concerns reported    No    Tobacco Cessation No Change    Warm-up and Cool-down Performed on first and last piece of equipment    Resistance Training Performed Yes    VAD Patient? No    PAD/SET Patient? No      Pain Assessment   Currently in Pain? No/denies                Social History   Tobacco Use  Smoking Status Former   Packs/day: 2.50   Types: Cigarettes   Quit date: 08/21/1991   Years since quitting: 29.4  Smokeless Tobacco Never    Goals Met:  Independence with exercise equipment Exercise tolerated well No report of concerns or symptoms today Strength training completed today  Goals Unmet:  Not Applicable  Comments: Pt able to follow exercise prescription today without complaint.  Will continue to monitor for progression.    Dr. Emily Filbert is Medical Director for [place].  Dr. Ottie Glazier is Medical Director for [facility] Pulmonary Rehabilitation.

## 2021-02-05 ENCOUNTER — Encounter: Payer: Medicare HMO | Admitting: *Deleted

## 2021-02-05 ENCOUNTER — Other Ambulatory Visit: Payer: Self-pay

## 2021-02-05 DIAGNOSIS — Z48812 Encounter for surgical aftercare following surgery on the circulatory system: Secondary | ICD-10-CM | POA: Diagnosis not present

## 2021-02-05 DIAGNOSIS — Z955 Presence of coronary angioplasty implant and graft: Secondary | ICD-10-CM

## 2021-02-05 NOTE — Progress Notes (Signed)
Daily Session Note  Patient Details  Name: FUQUAN WILSON MRN: [id number] Date of Birth: Oct 14, 1950 Referring Provider:   Flowsheet Row Cardiac Rehab from 12/12/2020 in [facility] Cardiac and Pulmonary Rehab  Referring Provider End, Harrell Gave MD       Encounter Date: 02/05/2021  Check In:  Session Check In - 02/05/21 0929       Check-In   Supervising physician immediately available to respond to emergencies See telemetry face sheet for immediately available ER MD    Location [facility]-Cardiac & Pulmonary Rehab    Staff Present Heath Lark, RN, BSN, Lance Sell, BA, ACSM CEP, Exercise Physiologist;Kelly Amedeo Plenty, BS, ACSM CEP, Exercise Physiologist    Virtual Visit No    Medication changes reported     No    Fall or balance concerns reported    No    Warm-up and Cool-down Performed on first and last piece of equipment    Resistance Training Performed Yes    VAD Patient? No    PAD/SET Patient? No      Pain Assessment   Currently in Pain? No/denies                Social History   Tobacco Use  Smoking Status Former   Packs/day: 2.50   Types: Cigarettes   Quit date: 08/21/1991   Years since quitting: 29.4  Smokeless Tobacco Never    Goals Met:  Independence with exercise equipment Exercise tolerated well Personal goals reviewed No report of concerns or symptoms today  Goals Unmet:  Not Applicable  Comments: Pt able to follow exercise prescription today without complaint.  Will continue to monitor for progression.    Dr. Emily Filbert is Medical Director for [place].  Dr. Ottie Glazier is Medical Director for [facility] Pulmonary Rehabilitation.

## 2021-02-07 ENCOUNTER — Other Ambulatory Visit: Payer: Self-pay

## 2021-02-07 DIAGNOSIS — Z48812 Encounter for surgical aftercare following surgery on the circulatory system: Secondary | ICD-10-CM | POA: Diagnosis not present

## 2021-02-07 DIAGNOSIS — I214 Non-ST elevation (NSTEMI) myocardial infarction: Secondary | ICD-10-CM

## 2021-02-07 DIAGNOSIS — Z955 Presence of coronary angioplasty implant and graft: Secondary | ICD-10-CM

## 2021-02-07 NOTE — Progress Notes (Signed)
Daily Session Note  Patient Details  Name: Willie Clark MRN: [id number] Date of Birth: May 18, 1950 Referring Provider:   Flowsheet Row Cardiac Rehab from 12/12/2020 in [facility] Cardiac and Pulmonary Rehab  Referring Provider End, Harrell Gave MD       Encounter Date: 02/07/2021  Check In:  Session Check In - 02/07/21 0915       Check-In   Supervising physician immediately available to respond to emergencies See telemetry face sheet for immediately available ER MD    Location [facility]-Cardiac & Pulmonary Rehab    Staff Present Birdie Sons, MPA, RN;Melissa Dewy Rose, RDN, Rowe Pavy, BA, ACSM CEP, Exercise Physiologist;Joseph Tessie Fass, [place]    Virtual Visit No    Medication changes reported     No    Fall or balance concerns reported    No    Tobacco Cessation No Change    Warm-up and Cool-down Performed on first and last piece of equipment    Resistance Training Performed Yes    VAD Patient? No    PAD/SET Patient? No      Pain Assessment   Currently in Pain? No/denies                Social History   Tobacco Use  Smoking Status Former   Packs/day: 2.50   Types: Cigarettes   Quit date: 08/21/1991   Years since quitting: 29.4  Smokeless Tobacco Never    Goals Met:  Independence with exercise equipment Exercise tolerated well No report of concerns or symptoms today Strength training completed today  Goals Unmet:  Not Applicable  Comments: Pt able to follow exercise prescription today without complaint.  Will continue to monitor for progression.    Dr. Emily Filbert is Medical Director for [place].  Dr. Ottie Glazier is Medical Director for [facility] Pulmonary Rehabilitation.

## 2021-02-09 ENCOUNTER — Other Ambulatory Visit: Payer: Self-pay

## 2021-02-09 ENCOUNTER — Encounter: Payer: Medicare HMO | Admitting: *Deleted

## 2021-02-09 DIAGNOSIS — Z955 Presence of coronary angioplasty implant and graft: Secondary | ICD-10-CM

## 2021-02-09 DIAGNOSIS — Z48812 Encounter for surgical aftercare following surgery on the circulatory system: Secondary | ICD-10-CM | POA: Diagnosis not present

## 2021-02-09 DIAGNOSIS — I214 Non-ST elevation (NSTEMI) myocardial infarction: Secondary | ICD-10-CM

## 2021-02-09 NOTE — Progress Notes (Signed)
Daily Session Note  Patient Details  Name: Willie Clark MRN: [id number] Date of Birth: Mar 12, 1951 Referring Provider:   Flowsheet Row Cardiac Rehab from 12/12/2020 in [facility] Cardiac and Pulmonary Rehab  Referring Provider End, Harrell Gave MD       Encounter Date: 02/09/2021  Check In:  Session Check In - 02/09/21 0934       Check-In   Supervising physician immediately available to respond to emergencies See telemetry face sheet for immediately available ER MD    Location [facility]-Cardiac & Pulmonary Rehab    Staff Present Nyoka Cowden, RN, BSN, Ardeth Sportsman, RDN, LDN;Susanne Bice, RN, BSN, CCRP    Virtual Visit No    Medication changes reported     No    Fall or balance concerns reported    No    Tobacco Cessation No Change    Warm-up and Cool-down Performed on first and last piece of equipment    Resistance Training Performed Yes    VAD Patient? No    PAD/SET Patient? No      Pain Assessment   Currently in Pain? No/denies                Social History   Tobacco Use  Smoking Status Former   Packs/day: 2.50   Types: Cigarettes   Quit date: 08/21/1991   Years since quitting: 29.4  Smokeless Tobacco Never    Goals Met:  Independence with exercise equipment Exercise tolerated well No report of concerns or symptoms today  Goals Unmet:  Not Applicable  Comments: Pt able to follow exercise prescription today without complaint.  Will continue to monitor for progression.    Dr. Emily Filbert is Medical Director for [place].  Dr. Ottie Glazier is Medical Director for [facility] Pulmonary Rehabilitation.

## 2021-02-12 ENCOUNTER — Other Ambulatory Visit: Payer: Self-pay

## 2021-02-12 ENCOUNTER — Encounter: Payer: Medicare HMO | Admitting: *Deleted

## 2021-02-12 DIAGNOSIS — Z955 Presence of coronary angioplasty implant and graft: Secondary | ICD-10-CM

## 2021-02-12 NOTE — Progress Notes (Signed)
Daily Session Note  Patient Details  Name: Willie Clark MRN: [id number] Date of Birth: November 08, 1950 Referring Provider:   Flowsheet Row Cardiac Rehab from 12/12/2020 in [facility] Cardiac and Pulmonary Rehab  Referring Provider End, Harrell Gave MD       Encounter Date: 02/12/2021  Check In:  Session Check In - 02/12/21 0931       Check-In   Supervising physician immediately available to respond to emergencies See telemetry face sheet for immediately available ER MD    Location [facility]-Cardiac & Pulmonary Rehab    Staff Present Heath Lark, RN, BSN, Laveda Norman, BS, ACSM CEP, Exercise Physiologist;Joseph [place], [place]    Virtual Visit No    Medication changes reported     No    Fall or balance concerns reported    No    Warm-up and Cool-down Performed on first and last piece of equipment    Resistance Training Performed Yes    VAD Patient? No    PAD/SET Patient? No      Pain Assessment   Currently in Pain? No/denies                Social History   Tobacco Use  Smoking Status Former   Packs/day: 2.50   Types: Cigarettes   Quit date: 08/21/1991   Years since quitting: 29.5  Smokeless Tobacco Never    Goals Met:  Independence with exercise equipment Exercise tolerated well No report of concerns or symptoms today  Goals Unmet:  Not Applicable  Comments: Pt able to follow exercise prescription today without complaint.  Will continue to monitor for progression.    Dr. Emily Filbert is Medical Director for [place].  Dr. Ottie Glazier is Medical Director for [facility] Pulmonary Rehabilitation.

## 2021-02-14 ENCOUNTER — Other Ambulatory Visit: Payer: Self-pay

## 2021-02-14 DIAGNOSIS — Z955 Presence of coronary angioplasty implant and graft: Secondary | ICD-10-CM

## 2021-02-14 DIAGNOSIS — Z48812 Encounter for surgical aftercare following surgery on the circulatory system: Secondary | ICD-10-CM | POA: Diagnosis not present

## 2021-02-14 DIAGNOSIS — I214 Non-ST elevation (NSTEMI) myocardial infarction: Secondary | ICD-10-CM

## 2021-02-14 NOTE — Progress Notes (Signed)
Daily Session Note  Patient Details  Name: Willie Clark MRN: [id number] Date of Birth: 09/13/1950 Referring Provider:   Flowsheet Row Cardiac Rehab from 12/12/2020 in [facility] Cardiac and Pulmonary Rehab  Referring Provider End, Harrell Gave MD       Encounter Date: 02/14/2021  Check In:  Session Check In - 02/14/21 0923       Check-In   Supervising physician immediately available to respond to emergencies See telemetry face sheet for immediately available ER MD    Location [facility]-Cardiac & Pulmonary Rehab    Staff Present Birdie Sons, MPA, Elveria Rising, BA, ACSM CEP, Exercise Physiologist;Joseph Tessie Fass, [place]    Virtual Visit No    Medication changes reported     No    Fall or balance concerns reported    No    Tobacco Cessation No Change    Warm-up and Cool-down Performed on first and last piece of equipment    Resistance Training Performed Yes    VAD Patient? No    PAD/SET Patient? No      Pain Assessment   Currently in Pain? No/denies                Social History   Tobacco Use  Smoking Status Former   Packs/day: 2.50   Types: Cigarettes   Quit date: 08/21/1991   Years since quitting: 29.5  Smokeless Tobacco Never    Goals Met:  Independence with exercise equipment Exercise tolerated well No report of concerns or symptoms today Strength training completed today  Goals Unmet:  Not Applicable  Comments: Pt able to follow exercise prescription today without complaint.  Will continue to monitor for progression.    Dr. Emily Filbert is Medical Director for [place].  Dr. Ottie Glazier is Medical Director for [facility] Pulmonary Rehabilitation.

## 2021-02-16 ENCOUNTER — Encounter: Payer: Medicare HMO | Admitting: *Deleted

## 2021-02-16 ENCOUNTER — Other Ambulatory Visit: Payer: Self-pay

## 2021-02-16 DIAGNOSIS — Z48812 Encounter for surgical aftercare following surgery on the circulatory system: Secondary | ICD-10-CM | POA: Diagnosis not present

## 2021-02-16 DIAGNOSIS — Z955 Presence of coronary angioplasty implant and graft: Secondary | ICD-10-CM

## 2021-02-16 NOTE — Progress Notes (Signed)
Daily Session Note  Patient Details  Name: Willie Clark MRN: [id number] Date of Birth: Oct 11, 1950 Referring Provider:   Flowsheet Row Cardiac Rehab from 12/12/2020 in [facility] Cardiac and Pulmonary Rehab  Referring Provider End, Harrell Gave MD       Encounter Date: 02/16/2021  Check In:  Session Check In - 02/16/21 0918       Check-In   Supervising physician immediately available to respond to emergencies See telemetry face sheet for immediately available ER MD    Location [facility]-Cardiac & Pulmonary Rehab    Staff Present Renita Papa, RN BSN;Joseph [place], RCP,RRT,BSRT;Jessica Donnelly, [place], RCEP, CCRP, CCET    Virtual Visit No    Medication changes reported     No    Fall or balance concerns reported    No    Warm-up and Cool-down Performed on first and last piece of equipment    Resistance Training Performed Yes    VAD Patient? No    PAD/SET Patient? No      Pain Assessment   Currently in Pain? No/denies                Social History   Tobacco Use  Smoking Status Former   Packs/day: 2.50   Types: Cigarettes   Quit date: 08/21/1991   Years since quitting: 29.5  Smokeless Tobacco Never    Goals Met:  Independence with exercise equipment Personal goals reviewed No report of concerns or symptoms today Strength training completed today  Goals Unmet:  Not Applicable  Comments: Pt able to follow exercise prescription today without complaint.  Will continue to monitor for progression.    Dr. Emily Filbert is Medical Director for [place].  Dr. Ottie Glazier is Medical Director for [facility] Pulmonary Rehabilitation.

## 2021-02-19 ENCOUNTER — Encounter: Payer: Medicare HMO | Admitting: *Deleted

## 2021-02-19 ENCOUNTER — Other Ambulatory Visit: Payer: Self-pay

## 2021-02-19 DIAGNOSIS — Z955 Presence of coronary angioplasty implant and graft: Secondary | ICD-10-CM

## 2021-02-19 DIAGNOSIS — Z48812 Encounter for surgical aftercare following surgery on the circulatory system: Secondary | ICD-10-CM | POA: Diagnosis not present

## 2021-02-19 NOTE — Progress Notes (Signed)
Daily Session Note  Patient Details  Name: Willie Clark MRN: [id number] Date of Birth: 01-30-1951 Referring Provider:   Flowsheet Row Cardiac Rehab from 12/12/2020 in [facility] Cardiac and Pulmonary Rehab  Referring Provider End, Harrell Gave MD       Encounter Date: 02/19/2021  Check In:  Session Check In - 02/19/21 0927       Check-In   Supervising physician immediately available to respond to emergencies See telemetry face sheet for immediately available ER MD    Location [facility]-Cardiac & Pulmonary Rehab    Staff Present Heath Lark, RN, BSN, CCRP;Laureen Owens Shark, BS, RRT, CPFT;Kelly Amedeo Plenty, BS, ACSM CEP, Exercise Physiologist    Virtual Visit No    Medication changes reported     No    Fall or balance concerns reported    No    Warm-up and Cool-down Performed on first and last piece of equipment    Resistance Training Performed Yes    VAD Patient? No    PAD/SET Patient? No      Pain Assessment   Currently in Pain? No/denies                Social History   Tobacco Use  Smoking Status Former   Packs/day: 2.50   Types: Cigarettes   Quit date: 08/21/1991   Years since quitting: 29.5  Smokeless Tobacco Never    Goals Met:  Independence with exercise equipment Exercise tolerated well No report of concerns or symptoms today  Goals Unmet:  Not Applicable  Comments: Pt able to follow exercise prescription today without complaint.  Will continue to monitor for progression.    Dr. Emily Filbert is Medical Director for [place].  Dr. Ottie Glazier is Medical Director for [facility] Pulmonary Rehabilitation.

## 2021-02-21 ENCOUNTER — Encounter: Payer: Self-pay | Admitting: *Deleted

## 2021-02-21 ENCOUNTER — Other Ambulatory Visit: Payer: Self-pay | Admitting: Gastroenterology

## 2021-02-21 ENCOUNTER — Other Ambulatory Visit: Payer: Self-pay

## 2021-02-21 ENCOUNTER — Encounter: Payer: Medicare HMO | Attending: Internal Medicine

## 2021-02-21 DIAGNOSIS — Z955 Presence of coronary angioplasty implant and graft: Secondary | ICD-10-CM

## 2021-02-21 DIAGNOSIS — Z8719 Personal history of other diseases of the digestive system: Secondary | ICD-10-CM

## 2021-02-21 DIAGNOSIS — I214 Non-ST elevation (NSTEMI) myocardial infarction: Secondary | ICD-10-CM | POA: Diagnosis present

## 2021-02-21 NOTE — Progress Notes (Signed)
Daily Session Note  Patient Details  Name: Willie Clark MRN: [id number] Date of Birth: 1951/03/27 Referring Provider:   Flowsheet Row Cardiac Rehab from 12/12/2020 in [facility] Cardiac and Pulmonary Rehab  Referring Provider End, Harrell Gave MD       Encounter Date: 02/21/2021  Check In:  Session Check In - 02/21/21 0919       Check-In   Supervising physician immediately available to respond to emergencies See telemetry face sheet for immediately available ER MD    Location [facility]-Cardiac & Pulmonary Rehab    Staff Present Birdie Sons, MPA, Elveria Rising, BA, ACSM CEP, Exercise Physiologist;Joseph Tessie Fass, [place]    Virtual Visit No    Medication changes reported     No    Fall or balance concerns reported    No    Tobacco Cessation No Change    Warm-up and Cool-down Performed on first and last piece of equipment    Resistance Training Performed Yes    VAD Patient? No    PAD/SET Patient? No      Pain Assessment   Currently in Pain? No/denies                Social History   Tobacco Use  Smoking Status Former   Packs/day: 2.50   Types: Cigarettes   Quit date: 08/21/1991   Years since quitting: 29.5  Smokeless Tobacco Never    Goals Met:  Independence with exercise equipment Exercise tolerated well No report of concerns or symptoms today Strength training completed today  Goals Unmet:  Not Applicable  Comments: Pt able to follow exercise prescription today without complaint.  Will continue to monitor for progression.    Dr. Emily Filbert is Medical Director for [place].  Dr. Ottie Glazier is Medical Director for [facility] Pulmonary Rehabilitation.

## 2021-02-21 NOTE — Progress Notes (Signed)
Cardiac Individual Treatment Plan  Patient Details  Name: FRANCESCO PROVENCAL MRN: [id number] Date of Birth: Aug 20, 1950 Referring Provider:   Flowsheet Row Cardiac Rehab from 12/12/2020 in [facility] Cardiac and Pulmonary Rehab  Referring Provider End, Harrell Gave MD       Initial Encounter Date:  Flowsheet Row Cardiac Rehab from 12/12/2020 in [facility] Cardiac and Pulmonary Rehab  Date 12/12/20       Visit Diagnosis: Status post coronary artery stent placement  Patient's Home Medications on Admission:  Current Outpatient Medications:    albuterol (PROVENTIL HFA;VENTOLIN HFA) 108 (90 Base) MCG/ACT inhaler, Inhale into the lungs every 4 (four) hours as needed for wheezing or shortness of breath., Disp: , Rfl:    aspirin EC 81 MG EC tablet, Take 1 tablet (81 mg total) by mouth daily. Swallow whole., Disp: 30 tablet, Rfl: 11   carvedilol (COREG) 3.125 MG tablet, TAKE 1 TABLET (3.125 MG TOTAL) BY MOUTH 2 (TWO) TIMES DAILY WITH A MEAL., Disp: 180 tablet, Rfl: 0   citalopram (CELEXA) 20 MG tablet, Take 20 mg by mouth daily. , Disp: , Rfl:    clopidogrel (PLAVIX) 75 MG tablet, TAKE (4 TABLETS) 300 MG STARTING FRI 12/08/20, THEN STARTING THE NEXT DAY TAKE (1) TABLET 75 MG DAILY., Disp: 90 tablet, Rfl: 1   docusate calcium (SURFAK) 240 MG capsule, Take 240 mg by mouth daily., Disp: , Rfl:    empagliflozin (JARDIANCE) 10 MG TABS tablet, Take 1 tablet (10 mg total) by mouth daily before breakfast., Disp: 30 tablet, Rfl: 5   EPINEPHrine 0.3 mg/0.3 mL IJ SOAJ injection, Inject 0.3 mg into the muscle as needed. , Disp: , Rfl:    fexofenadine (ALLEGRA) 180 MG tablet, Take 180 mg by mouth daily., Disp: , Rfl:    finasteride (PROSCAR) 5 MG tablet, Take 5 mg by mouth daily., Disp: , Rfl:    gabapentin (NEURONTIN) 800 MG tablet, Take 1 tablet by mouth 3 (three) times daily., Disp: , Rfl:    isosorbide mononitrate (IMDUR) 60 MG 24 hr tablet, Take 1 tablet (60 mg total) by mouth daily., Disp: 90 tablet, Rfl: 3   losartan  (COZAAR) 25 MG tablet, TAKE 1 TABLET EVERY DAY, Disp: 90 tablet, Rfl: 0   mesalamine (APRISO) 0.375 g 24 hr capsule, Take 1.4 mg by mouth daily., Disp: , Rfl:    Multiple Vitamins-Minerals (HAIR SKIN AND NAILS FORMULA PO), Take 1 capsule by mouth daily., Disp: , Rfl:    NALTREXONE HCL PO, Take 2.5 mg by mouth daily., Disp: , Rfl:    nitroGLYCERIN (NITROSTAT) 0.4 MG SL tablet, Place 1 tablet (0.4 mg total) under the tongue every 5 (five) minutes as needed for chest pain., Disp: 90 tablet, Rfl: 1   ondansetron (ZOFRAN) 4 MG tablet, Take 4 mg by mouth every 8 (eight) hours as needed for nausea or vomiting., Disp: , Rfl:    pantoprazole (PROTONIX) 40 MG tablet, Take 40 mg by mouth 2 (two) times daily. , Disp: , Rfl:    Peppermint Oil (IBGARD PO), Take by mouth daily at 2 am., Disp: , Rfl:    potassium chloride SA (KLOR-CON) 20 MEQ tablet, Take 1 tablet (20 mEq total) by mouth daily., Disp: 30 tablet, Rfl: 2   Probiotic Product (PROBIOTIC PO), Take by mouth daily at 8 pm., Disp: , Rfl:    rOPINIRole (REQUIP) 1 MG tablet, Take 1 mg by mouth 2 (two) times daily., Disp: , Rfl:    rosuvastatin (CRESTOR) 40 MG tablet, TAKE 1 TABLET  BY MOUTH EVERY DAY, Disp: 90 tablet, Rfl: 3   spironolactone (ALDACTONE) 25 MG tablet, TAKE 1 TABLET EVERY DAY, Disp: 90 tablet, Rfl: 1   torsemide (DEMADEX) 20 MG tablet, Take 2 tablets (40 mg total) by mouth daily., Disp: 180 tablet, Rfl: 0   traMADol (ULTRAM) 50 MG tablet, Take 50 mg by mouth every 6 (six) hours as needed., Disp: , Rfl:    VASCEPA 1 g capsule, TAKE 2 CAPSULES TWICE DAILY, Disp: 360 capsule, Rfl: 1   vitamin B-12 (CYANOCOBALAMIN) 1000 MCG tablet, Take 1,000 mcg by mouth daily., Disp: , Rfl:   Past Medical History: Past Medical History:  Diagnosis Date   Anxiety    Arthritis    Barrett's esophagus    Bowel obstruction ([place]) 02/2009   small bowel   CHF (congestive heart failure) (HCC)    Chronic chest pain    Coronary artery disease    a. 1993 CABG; b.  2007 Redo CABG; c. 09/2015 Cath: LM mild dzs, LAD 100ost, RI mild dzs, LCX 60p, OM1 100, RCA 100ost, VG->OM2 min irregs, LIMA->LAD nl, VG->RPDA 30ost, 20d, nl EF->Med rx.   Depression    Diverticulitis    Diverticulosis    Erectile dysfunction    Fibromyalgia    Gastric ulcer    Gastritis    GERD (gastroesophageal reflux disease)    Headache    Hemorrhoids    HFrEF (heart failure with reduced ejection fraction) ([place])    a. 09/2016 Echo: EF 55-60%, no rwma, triv AI, mild MR, mildly dil LA. -> as of April 2020: EF 40 to 45%.  Septal dyssynergy/hypokinesis due to postop state but otherwise unable to assess wall motion [name] due to poor study.   History of myocardial infarct at age less than 41 years    Hyperlipidemia    Hypertension    Kidney stones    going to [facility]  June 26 to see kidney specialist   Lightheadedness    Mass on back    Nephrolithiasis    Orthostatic hypotension    a. Improved after discontinuation of metoprolol.   OSA (obstructive sleep apnea)    a. On CPAP.   Pulmonary embolism ([place]) 01/2006   Post-op/treated   Skin lesions, generalized    Stroke [facility]) 1993   right side weakness no blood thinners   on Aspirin   Syncope and collapse    Urticaria     Tobacco Use: Social History   Tobacco Use  Smoking Status Former   Packs/day: 2.50   Types: Cigarettes   Quit date: 08/21/1991   Years since quitting: 29.5  Smokeless Tobacco Never    Labs: Recent Review Flowsheet Data     Labs for ITP Cardiac and Pulmonary Rehab Latest Ref Rng & Units 08/01/2018 12/11/2019 12/13/2019 02/07/2020 11/14/2020   Cholestrol 0 - 200 mg/dL 154 - 181 165 -   LDLCALC 0 - 99 mg/dL 56 - 84 61 -   HDL >40 mg/dL 41 - 35(L) 41 -   Trlycerides <150 mg/dL 285(H) - 312(H) 317(H) -   Hemoglobin A1c 4.8 - 5.6 % - 6.0(H) - - 6.1(H)        Exercise Target Goals: Exercise Program Goal: Individual exercise prescription set using results from initial 6 min walk test and THRR while  considering  patient's activity barriers and safety.   Exercise Prescription Goal: Initial exercise prescription builds to 30-45 minutes a day of aerobic activity, 2-3 days per week.  Home exercise guidelines will  be given to patient during program as part of exercise prescription that the participant will acknowledge.   Education: Aerobic Exercise: - Group verbal and visual presentation on the components of exercise prescription. Introduces F.I.T.T principle from ACSM for exercise prescriptions.  Reviews F.I.T.T. principles of aerobic exercise including progression. Written material given at graduation. Flowsheet Row Cardiac Rehab from 09/13/2020 in [facility] Cardiac and Pulmonary Rehab  Education need identified 07/12/20  Date 08/09/20  Educator AS  Instruction Review Code 1- Verbalizes Understanding       Education: Resistance Exercise: - Group verbal and visual presentation on the components of exercise prescription. Introduces F.I.T.T principle from ACSM for exercise prescriptions  Reviews F.I.T.T. principles of resistance exercise including progression. Written material given at graduation. Flowsheet Row Cardiac Rehab from 09/13/2020 in [facility] Cardiac and Pulmonary Rehab  Date 08/16/20  Educator AS  Instruction Review Code 1- Verbalizes Understanding        Education: Exercise & Equipment Safety: - Individual verbal instruction and demonstration of equipment use and safety with use of the equipment. Flowsheet Row Cardiac Rehab from 01/17/2021 in [facility] Cardiac and Pulmonary Rehab  Date 12/12/20  Educator [facility]  Instruction Review Code 1- Verbalizes Understanding       Education: Exercise Physiology & General Exercise Guidelines: - Group verbal and written instruction with models to review the exercise physiology of the cardiovascular system and associated critical values. Provides general exercise guidelines with specific guidelines to those with heart or lung disease.  Flowsheet Row  Cardiac Rehab from 09/13/2020 in [facility] Cardiac and Pulmonary Rehab  Date 08/02/20  Educator [facility]  Instruction Review Code 1- Verbalizes Understanding       Education: Flexibility, Balance, Mind/Body Relaxation: - Group verbal and visual presentation with interactive activity on the components of exercise prescription. Introduces F.I.T.T principle from ACSM for exercise prescriptions. Reviews F.I.T.T. principles of flexibility and balance exercise training including progression. Also discusses the mind body connection.  Reviews various relaxation techniques to help reduce and manage stress (i.e. Deep breathing, progressive muscle relaxation, and visualization). Balance handout provided to take home. Written material given at graduation. Flowsheet Row Cardiac Rehab from 01/17/2021 in [facility] Cardiac and Pulmonary Rehab  Education need identified 12/12/20       Activity Barriers & Risk Stratification:  Activity Barriers & Cardiac Risk Stratification - 12/12/20 0747       Activity Barriers & Cardiac Risk Stratification   Activity Barriers Arthritis;Back Problems;Neck/Spine Problems;Assistive Device;Deconditioning;Muscular Weakness;Shortness of Breath;Balance Concerns;Fibromyalgia    Cardiac Risk Stratification High             6 Minute Walk:  6 Minute Walk     Row Name 12/12/20 0858         6 Minute Walk   Phase Initial     Distance 1215 feet     Walk Time 6 minutes     # of Rest Breaks 0     MPH 2.3     METS 2.85     RPE 13     Perceived Dyspnea  3     VO2 Peak 9.96     Symptoms Yes (comment)     Comments SOB, ankle pain 10/10, knee pain 8/10 (Chronic pain)     Resting HR 65 bpm     Resting BP 144/84     Resting Oxygen Saturation  97 %     Exercise Oxygen Saturation  during 6 min walk 98 %     Max Ex. HR 98 bpm  Max Ex. BP 152/64     2 Minute Post BP 136/64              Oxygen Initial Assessment:   Oxygen Re-Evaluation:   Oxygen Discharge (Final Oxygen  Re-Evaluation):   Initial Exercise Prescription:  Initial Exercise Prescription - 12/12/20 0800       Date of Initial Exercise RX and Referring Provider   Date 12/12/20    Referring Provider End, Harrell Gave MD      Treadmill   MPH 2.3    Grade 0.5    Minutes 15    METs 2.92      Recumbant Bike   Level 2    RPM 50    Watts 25    Minutes 15    METs 2.5      REL-XR   Level 2    Speed 50    Minutes 15    METs 2.5      T5 Nustep   Level 3    SPM 80    Minutes 15    METs 2.5      Prescription Details   Frequency (times per week) 3    Duration Progress to 30 minutes of continuous aerobic without signs/symptoms of physical distress      Intensity   THRR 40-80% of Max Heartrate 99-134    Ratings of Perceived Exertion 11-13    Perceived Dyspnea 0-4      Progression   Progression Continue to progress workloads to maintain intensity without signs/symptoms of physical distress.      Resistance Training   Training Prescription Yes    Weight 4 lb    Reps 10-15             Perform Capillary Blood Glucose checks as needed.  Exercise Prescription Changes:   Exercise Prescription Changes     Row Name 12/12/20 0800 12/18/20 1400 12/22/20 1000 01/02/21 1200 01/16/21 0800     Response to Exercise   Blood Pressure (Admit) 144/84 146/70 -- 110/62 118/62   Blood Pressure (Exercise) 152/64 128/78 -- 130/62 --   Blood Pressure (Exit) 136/64 124/64 -- 104/62 110/70   Heart Rate (Admit) 65 bpm 59 bpm -- 68 bpm 97 bpm   Heart Rate (Exercise) 98 bpm 97 bpm -- 95 bpm 112 bpm   Heart Rate (Exit) 72 bpm 55 bpm -- 77 bpm 84 bpm   Oxygen Saturation (Admit) 97 % -- -- -- --   Oxygen Saturation (Exercise) 98 % -- -- -- --   Rating of Perceived Exertion (Exercise) 13 13 -- 13 13   Perceived Dyspnea (Exercise) 3 -- -- -- --   Symptoms SOB, ankle and knee pain none -- none none   Comments walk test results third full day of exercise -- -- --   Duration -- Continue with 30 min  of aerobic exercise without signs/symptoms of physical distress. -- Continue with 30 min of aerobic exercise without signs/symptoms of physical distress. Continue with 30 min of aerobic exercise without signs/symptoms of physical distress.   Intensity -- THRR unchanged -- THRR unchanged THRR unchanged     Progression   Progression -- Continue to progress workloads to maintain intensity without signs/symptoms of physical distress. -- Continue to progress workloads to maintain intensity without signs/symptoms of physical distress. Continue to progress workloads to maintain intensity without signs/symptoms of physical distress.   Average METs -- 2.94 -- 2.6 3.73     Resistance Training   Training Prescription --  Yes -- Yes Yes   Weight -- 4 lb -- 4 lb 4 lb   Reps -- 10-15 -- 10-15 10-15     Interval Training   Interval Training -- No -- No No     Treadmill   MPH -- 2.3 -- -- 2.4   Grade -- 0.5 -- -- 4.5   Minutes -- 15 -- -- 15   METs -- 2.92 -- -- 4.33     Recumbant Bike   Level -- 7 -- 7 10   Watts -- 26 -- 33 43   Minutes -- 15 -- 15 15   METs -- 2.83 -- 2.9 4     REL-XR   Level -- 6 -- -- 8   Minutes -- 15 -- -- 15   METs -- 3.7 -- -- 4.5     T5 Nustep   Level -- 6 -- 6 7   Minutes -- 15 -- 15 15   METs -- 2.3 -- 2.3 2.5     Home Exercise Plan   Plans to continue exercise at -- -- Home (comment)  walking, treadmill -- Home (comment)  walking, treadmill   Frequency -- -- Add 2 additional days to program exercise sessions. -- Add 2 additional days to program exercise sessions.   Initial Home Exercises Provided -- -- 12/22/20 -- 12/22/20     Oxygen   Maintain Oxygen Saturation -- -- -- -- 88% or higher    Row Name 01/29/21 1400 02/12/21 1100           Response to Exercise   Blood Pressure (Admit) 124/70 116/72      Blood Pressure (Exit) 126/70 120/60      Heart Rate (Admit) 61 bpm 58 bpm      Heart Rate (Exercise) 89 bpm 90 bpm      Heart Rate (Exit) 66 bpm 69 bpm       Rating of Perceived Exertion (Exercise) 13 13      Perceived Dyspnea (Exercise) 1 --      Symptoms none none      Duration Continue with 30 min of aerobic exercise without signs/symptoms of physical distress. Continue with 30 min of aerobic exercise without signs/symptoms of physical distress.      Intensity THRR unchanged THRR unchanged        Progression   Progression Continue to progress workloads to maintain intensity without signs/symptoms of physical distress. Continue to progress workloads to maintain intensity without signs/symptoms of physical distress.      Average METs 3.7 3.04        Resistance Training   Training Prescription Yes Yes      Weight 4 lb 4 lb      Reps 10-15 10-15        Interval Training   Interval Training No No        Recumbant Bike   Level -- 9      Watts -- 40      Minutes -- 15      METs -- 2.83        T5 Nustep   Level -- 7      Minutes -- 15      METs -- 2.6        Biostep-RELP   Level 6 6      Minutes 15 15      METs 4 4        Track   Laps -- 44  Minutes -- 15      METs -- 3.39        Home Exercise Plan   Plans to continue exercise at Home (comment)  walking, treadmill Home (comment)  walking, treadmill      Frequency Add 2 additional days to program exercise sessions. Add 2 additional days to program exercise sessions.      Initial Home Exercises Provided 12/22/20 12/22/20        Oxygen   Maintain Oxygen Saturation -- 88% or higher               Exercise Comments:   Exercise Comments     Row Name 12/13/20 6761           Exercise Comments First full day of exercise!  Patient was oriented to gym and equipment including functions, settings, policies, and procedures.  Patient's individual exercise prescription and treatment plan were reviewed.  All starting workloads were established based on the results of the 6 minute walk test done at initial orientation visit.  The plan for exercise progression was also  introduced and progression will be customized based on patient's performance and goals.                Exercise Goals and Review:   Exercise Goals     Row Name 12/12/20 0756             Exercise Goals   Increase Physical Activity Yes       Intervention Provide advice, education, support and counseling about physical activity/exercise needs.;Develop an individualized exercise prescription for aerobic and resistive training based on initial evaluation findings, risk stratification, comorbidities and participant's personal goals.       Expected Outcomes Short Term: Attend rehab on a regular basis to increase amount of physical activity.;Long Term: Add in home exercise to make exercise part of routine and to increase amount of physical activity.;Long Term: Exercising regularly at least 3-5 days a week.       Increase Strength and Stamina Yes       Intervention Provide advice, education, support and counseling about physical activity/exercise needs.;Develop an individualized exercise prescription for aerobic and resistive training based on initial evaluation findings, risk stratification, comorbidities and participant's personal goals.       Expected Outcomes Short Term: Increase workloads from initial exercise prescription for resistance, speed, and METs.;Short Term: Perform resistance training exercises routinely during rehab and add in resistance training at home;Long Term: Improve cardiorespiratory fitness, muscular endurance and strength as measured by increased METs and functional capacity (6MWT)       Able to understand and use rate of perceived exertion (RPE) scale Yes       Intervention Provide education and explanation on how to use RPE scale       Expected Outcomes Short Term: Able to use RPE daily in rehab to express subjective intensity level;Long Term:  Able to use RPE to guide intensity level when exercising independently       Able to understand and use Dyspnea scale Yes        Intervention Provide education and explanation on how to use Dyspnea scale       Expected Outcomes Short Term: Able to use Dyspnea scale daily in rehab to express subjective sense of shortness of breath during exertion;Long Term: Able to use Dyspnea scale to guide intensity level when exercising independently       Knowledge and understanding of Target Heart Rate Range (THRR) Yes  Intervention Provide education and explanation of THRR including how the numbers were predicted and where they are located for reference       Expected Outcomes Short Term: Able to state/look up THRR;Long Term: Able to use THRR to govern intensity when exercising independently;Short Term: Able to use daily as guideline for intensity in rehab       Able to check pulse independently Yes       Intervention Provide education and demonstration on how to check pulse in carotid and radial arteries.;Review the importance of being able to check your own pulse for safety during independent exercise       Expected Outcomes Short Term: Able to explain why pulse checking is important during independent exercise;Long Term: Able to check pulse independently and accurately       Understanding of Exercise Prescription Yes       Intervention Provide education, explanation, and written materials on patient's individual exercise prescription       Expected Outcomes Short Term: Able to explain program exercise prescription;Long Term: Able to explain home exercise prescription to exercise independently                Exercise Goals Re-Evaluation :  Exercise Goals Re-Evaluation     Row Name 12/13/20 1779 12/18/20 1413 12/22/20 1024 01/02/21 1242 01/05/21 0925     Exercise Goal Re-Evaluation   Exercise Goals Review Increase Physical Activity;Able to understand and use rate of perceived exertion (RPE) scale;Knowledge and understanding of Target Heart Rate Range (THRR);Understanding of Exercise Prescription;Increase Strength and  Stamina;Able to understand and use Dyspnea scale;Able to check pulse independently Increase Physical Activity;Increase Strength and Stamina;Understanding of Exercise Prescription Increase Physical Activity;Increase Strength and Stamina;Understanding of Exercise Prescription Increase Physical Activity;Increase Strength and Stamina Increase Physical Activity;Increase Strength and Stamina;Understanding of Exercise Prescription   Comments Reviewed RPE and dyspnea scales, THR and program prescription with pt today.  Pt voiced understanding and was given a copy of goals to take home. [name] is off to a good start returning to rehab.  He has completed his first three full days already thus far.  He is already up to level 7 on the bike and level 6 on the T5 NuStep.  We will continue to monitor his progress. Updated home exercise with pt today.  Pt plans to walk and use treadmill at home for exercise.  We also talked about using staff videos for home use.  Reviewed THR, pulse, RPE, sign and symptoms, pulse oximetery and when to call 911 or MD.  Also discussed weather considerations and indoor options.  Pt voiced understanding. [name] continues to do well with exercise.  He has increased levels on seated machines.  Staff will encourage increasing Tm also. [name] is doing well in rehab.  His diverticulitis is flared up currently, reducing his ability to exert.  He is trying to stay active at home.  He is starting to regain his strength and his wife keeps him busy.   Expected Outcomes Short: Use RPE daily to regulate intensity. Long: Follow program prescription in THR. Short: Continue to attend rehab regularly Long: Continue to follow program prescription Short: Continue to use treadmill on off days Long: Continue to improve stamina. Short: increase levels on TM Long:increase MET level Short: Get back to pushing again after flare up Long: COntinue to improve stamina    Row Name 01/16/21 0808 01/29/21 1448 02/05/21 0931  02/12/21 1112       Exercise Goal Re-Evaluation   Exercise Goals  Review Increase Physical Activity;Increase Strength and Stamina;Understanding of Exercise Prescription Increase Physical Activity;Increase Strength and Stamina Increase Physical Activity;Increase Strength and Stamina Increase Physical Activity;Increase Strength and Stamina    Comments [name] is doing well in rehab.  He is now doing level 10 on the bike and level 7 on the T5.  We will continue to monitor his progress. [name] attends consistently and works in correct RPE.  He is up to 43 laps on the track!  Staff will encourage trying 5 lb for strength. [name] is consistently coming to rehab 3 times per week. He feels like his SOB was getting better but is not coming back and that is his main limiting factor. He made some progress on levels on machines but feels like he has leveled off due to his SOB limiting him. 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 [name] is continuing to do well in rehab. He has reached a highest number of 44 laps on the track, to equal a little over 1 mile. He should be encouraged to increase to 5 lbs for his handweights. Will continue to monitor.    Expected Outcomes Short: Continue to push up on workloads Long: Continue to improve strength Short: continue to attend consistently Long: continue to build stamina Short: continue to attend consistently Long: continue to build stamina and improve SOB Short: Increase to 5 lbs handweights Long: Continue to increase overall MET level and strength             Discharge Exercise Prescription (Final Exercise Prescription Changes):  Exercise Prescription Changes - 02/12/21 1100       Response to Exercise   Blood Pressure (Admit) 116/72    Blood Pressure (Exit) 120/60    Heart Rate (Admit) 58 bpm    Heart Rate (Exercise) 90 bpm    Heart Rate (Exit) 69 bpm    Rating of Perceived Exertion (Exercise)  13    Symptoms none    Duration Continue with 30 min of aerobic exercise without signs/symptoms of physical distress.    Intensity THRR unchanged      Progression   Progression Continue to progress workloads to maintain intensity without signs/symptoms of physical distress.    Average METs 3.04      Resistance Training   Training Prescription Yes    Weight 4 lb    Reps 10-15      Interval Training   Interval Training No      Recumbant Bike   Level 9    Watts 40    Minutes 15    METs 2.83      T5 Nustep   Level 7    Minutes 15    METs 2.6      Biostep-RELP   Level 6    Minutes 15    METs 4      Track   Laps 44    Minutes 15    METs 3.39      Home Exercise Plan   Plans to continue exercise at Home (comment)   walking, treadmill   Frequency Add 2 additional days to program exercise sessions.    Initial Home Exercises Provided 12/22/20      Oxygen   Maintain Oxygen Saturation 88% or higher             Nutrition:  Target Goals: Understanding of nutrition guidelines, daily intake of sodium <1558m, cholesterol <  264m, calories 30% from fat and 7% or less from saturated fats, daily to have 5 or more servings of fruits and vegetables.  Education: All About Nutrition: -Group instruction provided by verbal, written material, interactive activities, discussions, models, and posters to present general guidelines for heart healthy nutrition including fat, fiber, MyPlate, the role of sodium in heart healthy nutrition, utilization of the nutrition label, and utilization of this knowledge for meal planning. Follow up email sent as well. Written material given at graduation. [facility]  Education need identified 12/12/20       Biometrics:  Pre Biometrics - 12/12/20 0754       Pre Biometrics   Height 5' 10.1" (1.781 m)    Weight 208 lb (94.3 kg)    BMI (Calculated) 29.74    Single Leg Stand 13.4  seconds              Nutrition Therapy Plan and Nutrition Goals:  Nutrition Therapy & Goals - 01/05/21 0928       Nutrition Therapy   RD appointment deferred Yes   DShanon Browwas just here a few months ago and does not want to meet with RD this time.   Fiber 30 grams    Whole Grain Foods 3 servings    Saturated Fats 12 max. grams    Fruits and Vegetables 8 servings/day    Sodium 1.5 grams      Personal Nutrition Goals   Nutrition Goal Lose weight    Comments Focus on heart health eating and good balance      Intervention Plan   Intervention Prescribe, educate and counsel regarding individualized specific dietary modifications aiming towards targeted core components such as weight, hypertension, lipid management, diabetes, heart failure and other comorbidities.    Expected Outcomes Short Term Goal: Understand basic principles of dietary content, such as calories, fat, sodium, cholesterol and nutrients.;Short Term Goal: A plan has been developed with personal nutrition goals set during dietitian appointment.;Long Term Goal: Adherence to prescribed nutrition plan.             Nutrition Assessments:  MEDIFICTS Score Key: ?70 Need to make dietary changes  40-70 Heart Healthy Diet ? 40 Therapeutic Level Cholesterol Diet  [facility]  Picture Your Plate Total Score on Admission 70      Picture Your Plate Scores: <<15Unhealthy dietary pattern with much room for improvement. 41-50 Dietary pattern unlikely to meet recommendations for good health and room for improvement. 51-60 More healthful dietary pattern, with some room for improvement.  >60 Healthy dietary pattern, although there may be some specific behaviors that could be improved.    Nutrition Goals Re-Evaluation:  Nutrition Goals Re-Evaluation     [place] 02/05/21 0941             Goals   Nutrition Goal Lose weight       Comment DShanon Browreports  that his weight has been steady. He continues to try to eat a balance diet. He does not use much dressing when eating salad and continues to watch his sodium intake.       Expected Outcome Short:  eat balanced amount of carbs fat and protein as recommended by RD Long:  manage weight on his own                Nutrition Goals Discharge (Final Nutrition Goals Re-Evaluation):  Nutrition Goals Re-Evaluation - 02/05/21 0941       Goals   Nutrition Goal Lose weight    Comment [name] reports that his weight has been steady. He continues to try to eat a balance diet. He does not use much dressing when eating salad and continues to watch his sodium intake.    Expected Outcome Short:  eat balanced amount of carbs fat and protein as recommended by RD Long:  manage weight on his own             Psychosocial: Target Goals: Acknowledge presence or absence of significant depression and/or stress, maximize coping skills, provide positive support system. Participant is able to verbalize types and ability to use techniques and skills needed for reducing stress and depression.   Education: Stress, Anxiety, and Depression - Group verbal and visual presentation to define topics covered.  Reviews how body is impacted by stress, anxiety, and depression.  Also discusses healthy ways to reduce stress and to treat/manage anxiety and depression.  Written material given at graduation. Flowsheet Row Cardiac Rehab from 01/17/2021 in [facility] Cardiac and Pulmonary Rehab  Education need identified 12/12/20       Education: Sleep Hygiene -Provides group verbal and written instruction about how sleep can affect your health.  Define sleep hygiene, discuss sleep cycles and impact of sleep habits. Review good sleep hygiene tips.    Initial Review & Psychosocial Screening:  Initial Psych Review & Screening - 12/12/20 0748       Initial Review   Current issues with Current Stress Concerns;Current Psychotropic  Meds;Current Sleep Concerns;History of Depression    Source of Stress Concerns Unable to participate in former interests or hobbies;Unable to perform yard/household activities;Family    Comments Knee pain, back pain, shortness of breath are limiting; brother's health (just got LVAD), still lack of sleep, on [place]? Yes   brother, wife     Barriers   Psychosocial barriers to participate in program The patient should benefit from training in stress management and relaxation.;Psychosocial barriers identified (see note)      Screening Interventions   Interventions Encouraged to exercise;To provide support and resources with identified psychosocial needs;Provide feedback about the scores to participant    Expected Outcomes Short Term goal: Utilizing psychosocial counselor, staff and physician to assist with identification of specific Stressors or current issues interfering with healing process. Setting desired goal for each stressor or current issue identified.;Long Term Goal: Stressors or current issues are controlled or eliminated.;Short Term goal: Identification and review with participant of any Quality of Life or Depression concerns found by scoring the questionnaire.;Long Term goal: The participant improves quality of Life and PHQ9 Scores as seen by post scores and/or verbalization of changes             Quality of Life Scores:   Quality of Life - 12/12/20 0901       Quality of Life   Select Quality of Life      Quality of Life Scores   Health/Function Pre 16.67 %    Socioeconomic Pre 22.13 %    Psych/Spiritual Pre 23.36 %    Family Pre 20.2 %    GLOBAL Pre 19.45 %            Scores of 19 and below usually indicate a poorer quality of life in these areas.  A difference of  2-3 points is a clinically meaningful difference.  A difference of 2-3 points in the total score of the Quality of Life Index has been associated with significant  improvement in overall quality of life, self-image, physical symptoms, and general health in studies assessing change in quality of life.  PHQ-9: Recent Review Flowsheet Data     Depression screen [facility] 2/9 12/12/2020 08/16/2020 07/12/2020   Decreased Interest 2 0 3   Down, Depressed, Hopeless 1 0 1   PHQ - 2 Score 3 0 4   Altered sleeping _0 Tired, decreased energy _1 Change in appetite 0 1 0   Feeling bad or failure about yourself  1 0 0   Trouble concentrating 1 3 0   Moving slowly or fidgety/restless 1 0 1   Suicidal thoughts 0 0 0   PHQ-9 Score _2 Difficult doing work/chores Somewhat difficult Somewhat difficult Somewhat difficult      Interpretation of Total Score  Total Score Depression Severity:  1-4 = Minimal depression, 5-9 = Mild depression, 10-14 = Moderate depression, 15-19 = Moderately severe depression, 20-27 = Severe depression   Psychosocial Evaluation and Intervention:  Psychosocial Evaluation - 12/12/20 0750       Psychosocial Evaluation & Interventions   Interventions Encouraged to exercise with the program and follow exercise prescription;Stress management education;Relaxation education    Comments [name] is returning to rehab after another stent placement.  He has also just switched his blood thinner so he is beginning to feel better.  He has not been having any chest pain, and his breathing is beginning to recover.  He really wants to get back to being able to go and do again without the shortness of breath.  He is on celexa for depression and feeling well managed.  He continues to have issues with sleeping, but that is chronic in nature.  His brother got an LVAD after being in hospital for over a month and recovering. He is still worried about his brother's health.    Expected Outcomes Short; attend cardiac rehab for education and exercise. Long: develop positive self care behaviors.    Continue Psychosocial Services  Follow up required by staff              Psychosocial Re-Evaluation:  Psychosocial Re-Evaluation     [place] Name 01/05/21 0926 02/05/21 0936           Psychosocial Re-Evaluation   Current issues with Current Sleep Concerns;Current Stress Concerns Current Sleep Concerns;Current Stress Concerns      Comments [name] continues to struggle with sleep. This past week, his diverticulitis has been keeping him up at night.  His wife keeps him busy and active. He denies any other major stressor as he tries not to let things get to him as much.  He realizes that he can't do anything about it.  He is hurting, but he cannot get injections due to his blood thinner until July. [name] reports no new stress or sleep concerns. He does not sleep well but this is a chronic issue. He reports still having a good support system and trying to have a positive outlook on life.      Expected Outcomes Short: Stay acitive for mental boost Long: continue to work on sleep. Short: Stay acitive for mental boost Long: continue to work on sleep.      Interventions Encouraged to attend Cardiac Rehabilitation for the exercise Encouraged to attend Cardiac Rehabilitation for the exercise  Continue Psychosocial Services  -- Follow up required by staff               Psychosocial Discharge (Final Psychosocial Re-Evaluation):  Psychosocial Re-Evaluation - 02/05/21 0936       Psychosocial Re-Evaluation   Current issues with Current Sleep Concerns;Current Stress Concerns    Comments [name] reports no new stress or sleep concerns. He does not sleep well but this is a chronic issue. He reports still having a good support system and trying to have a positive outlook on life.    Expected Outcomes Short: Stay acitive for mental boost Long: continue to work on sleep.    Interventions Encouraged to attend Cardiac Rehabilitation for the exercise    Continue Psychosocial Services  Follow up required by staff             Vocational Rehabilitation: Provide  vocational rehab assistance to qualifying candidates.   Vocational Rehab Evaluation & Intervention:   Education: Education Goals: Education classes will be provided on a variety of topics geared toward better understanding of heart health and risk factor modification. Participant will state understanding/return demonstration of topics presented as noted by education test scores.  Learning Barriers/Preferences:  Learning Barriers/Preferences - 12/12/20 0752       Learning Barriers/Preferences   Learning Barriers None    Learning Preferences None             General Cardiac Education Topics:  AED/CPR: - Group verbal and written instruction with the use of models to demonstrate the basic use of the AED with the basic ABC's of resuscitation.   Anatomy and Cardiac Procedures: - Group verbal and visual presentation and models provide information about basic cardiac anatomy and function. Reviews the testing methods done to diagnose heart disease and the outcomes of the test results. Describes the treatment choices: Medical Management, Angioplasty, or Coronary Bypass Surgery for treating various heart conditions including Myocardial Infarction, Angina, Valve Disease, and Cardiac Arrhythmias.  Written material given at graduation. Flowsheet Row Cardiac Rehab [facility]13/[facility] Re[facility]  Education need identified 07/12/20  Date 08/16/20  Educator SB  Instruction Review Code 1- Verbalizes Understanding       Medication Safety: - Group verbal and visual instruction to review commonly prescribed medications for heart and lung disease. Reviews the medication, class of the drug, and side effects. Includes the steps to properly store meds and maintain the prescription regimen.  Written material given at graduation. Flowsheet Row Cardiac Rehab from 01/17/2021 in [facility] Cardiac and Pulmonary Rehab  Date 01/03/21  Educator SB  Instruction Review Code 1- Verbalizes  Understanding       Intimacy: - Group verbal instruction through game format to discuss how heart and lung disease can affect sexual intimacy. Written material given at graduation.. Flowsheet Row Cardiac Rehab from 09/13/2020 in [facility] Cardiac and Pulmonary Rehab  Date 08/09/20  Educator AS  Instruction Review Code 1- Verbalizes Understanding       Know Your Numbers and Heart Failure: - Group verbal and visual instruction to discuss disease risk factors for cardiac and pulmonary disease and treatment options.  Reviews associated critical values for Overweight/Obesity, Hypertension, Cholesterol, and Diabetes.  Discusses basics of heart failure: signs/symptoms and treatments.  Introduces Heart Failure Zone chart for action plan for heart failure.  Written material given at graduation. Flowsheet Row Cardiac Rehab from 01/17/2021 in [facility] Cardiac and Pulmonary Rehab  Date 01/17/21  Educator KB  Instruction Review Code 1- Verbalizes Understanding  Infection Prevention: - Provides verbal and written material to individual with discussion of infection control including proper hand washing and proper equipment cleaning during exercise session. Flowsheet Row Cardiac Rehab from 01/17/2021 in [facility] Cardiac and Pulmonary Rehab  Date 12/12/20  Educator [facility]  Instruction Review Code 1- Verbalizes Understanding       Falls Prevention: - Provides verbal and written material to individual with discussion of falls prevention and safety. Flowsheet Row Cardiac Rehab from 01/17/2021 in [facility] Cardiac and Pulmonary Rehab  Date 12/12/20  Educator [facility]  Instruction Review Code 1- Verbalizes Understanding       Other: -Provides group and verbal instruction on various topics (see comments)   Knowledge Questionnaire Score:  Knowledge Questionnaire Score - 12/12/20 0902       Knowledge Questionnaire Score   Pre Score 21/26             Core Components/Risk Factors/Patient Goals at  Admission:  Personal Goals and Risk Factors at Admission - 12/12/20 0754       Core Components/Risk Factors/Patient Goals on Admission    Weight Management Yes;Weight Loss    Intervention Weight Management: Develop a combined nutrition and exercise program designed to reach desired caloric intake, while maintaining appropriate intake of nutrient and fiber, sodium and fats, and appropriate energy expenditure required for the weight goal.;Weight Management: Provide education and appropriate resources to help participant work on and attain dietary goals.;Weight Management/Obesity: Establish reasonable short term and long term weight goals.    Admit Weight 208 lb (94.3 kg)    Goal Weight: Short Term 200 lb (90.7 kg)    Goal Weight: Long Term 190 lb (86.2 kg)    Expected Outcomes Short Term: Continue to assess and modify interventions until short term weight is achieved;Long Term: Adherence to nutrition and physical activity/exercise program aimed toward attainment of established weight goal;Weight Loss: Understanding of general recommendations for a balanced deficit meal plan, which promotes 1-2 lb weight loss per week and includes a negative energy balance of [phone] kcal/d;Understanding recommendations for meals to include 15-35% energy as protein, 25-35% energy from fat, 35-60% energy from carbohydrates, less than 247m of dietary cholesterol, 20-35 gm of total fiber daily;Understanding of distribution of calorie intake throughout the day with the consumption of 4-5 meals/snacks    Heart Failure Yes    Intervention Provide a combined exercise and nutrition program that is supplemented with education, support and counseling about heart failure. Directed toward relieving symptoms such as shortness of breath, decreased exercise tolerance, and extremity edema.    Expected Outcomes Improve functional capacity of life;Short term: Attendance in program 2-3 days a week with increased exercise capacity. Reported  lower sodium intake. Reported increased fruit and vegetable intake. Reports medication compliance.;Short term: Daily weights obtained and reported for increase. Utilizing diuretic protocols set by physician.;Long term: Adoption of self-care skills and reduction of barriers for early signs and symptoms recognition and intervention leading to self-care maintenance.    Hypertension Yes    Intervention Provide education on lifestyle modifcations including regular physical activity/exercise, weight management, moderate sodium restriction and increased consumption of fresh fruit, vegetables, and low fat dairy, alcohol moderation, and smoking cessation.;Monitor prescription use compliance.    Expected Outcomes Short Term: Continued assessment and intervention until BP is < 140/967mHG in hypertensive participants. < 130/8059mG in hypertensive participants with diabetes, heart failure or chronic kidney disease.;Long Term: Maintenance of blood pressure at goal levels.    Lipids Yes    Intervention Provide education  and support for participant on nutrition & aerobic/resistive exercise along with prescribed medications to achieve LDL <25m, HDL >445m    Expected Outcomes Short Term: Participant states understanding of desired cholesterol values and is compliant with medications prescribed. Participant is following exercise prescription and nutrition guidelines.;Long Term: Cholesterol controlled with medications as prescribed, with individualized exercise RX and with personalized nutrition plan. Value goals: LDL < 7038mHDL > 40 mg.             Education:Diabetes - Individual verbal and written instruction to review signs/symptoms of diabetes, desired ranges of glucose level fasting, after meals and with exercise. Acknowledge that pre and post exercise glucose checks will be done for 3 sessions at entry of program.   Core Components/Risk Factors/Patient Goals Review:   Goals and Risk Factor Review     Row  Name 01/05/21 0930 02/05/21 [id number]        Core Components/Risk Factors/Patient Goals Review   Personal Goals Review Weight Management/Obesity;Heart Failure;Hypertension;Lipids Weight Management/Obesity;Heart Failure;Hypertension;Lipids      Review DavShanon Brow doing well in rehab. His weight fluctuates some every day. He weighs daily in the morning and takes his meds.  His pressures have been in the 140s/50s recently.  He does still have some swelling in stomach, legs and feet.  He did take some extra lasix to help and it is better today. Patient reports taking all medications as prescirbed. He weighs daily and has a prodocol set up with his doctor to take an extra fluid pill if weight is up more then 2 pounds. His blood pressure and lipids are reported to be under control.      Expected Outcomes Short: Continue to keep close eye on weight Long: Continue to manage heart failure. Short: continue to attend cardiac consistently and keep an eye on weight. Long: continue to manage heart failure.               Core Components/Risk Factors/Patient Goals at Discharge (Final Review):   Goals and Risk Factor Review - 02/05/21 0937       Core Components/Risk Factors/Patient Goals Review   Personal Goals Review Weight Management/Obesity;Heart Failure;Hypertension;Lipids    Review Patient reports taking all medications as prescirbed. He weighs daily and has a prodocol set up with his doctor to take an extra fluid pill if weight is up more then 2 pounds. His blood pressure and lipids are reported to be under control.    Expected Outcomes Short: continue to attend cardiac consistently and keep an eye on weight. Long: continue to manage heart failure.             ITP Comments:  ITP Comments     Row Name 12/12/20 [id number]/24/22 0926 12/27/20 0748 01/24/21 1412 02/21/21 0807   ITP Comments Updated and reviewed medical record for new stent on 11/15/20.  Documentation for diagnosis can be found in [facility] note  from 11/15/20.  Completed 6MWT and gym orientation. Initial ITP created and sent for review to Dr. MarEmily Filbertedical Director. First full day of exercise!  Patient was oriented to gym and equipment including functions, settings, policies, and procedures.  Patient's individual exercise prescription and treatment plan were reviewed.  All starting workloads were established based on the results of the 6 minute walk test done at initial orientation visit.  The plan for exercise progression was also introduced and progression will be customized based on patient's performance and goals. 30 Day review completed. Medical Director ITP  review done, changes made as directed, and signed approval by Medical Director. 30 day review completed. ITP sent to Dr. Emily Filbert, Medical Director of Cardiac Rehab. Continue with ITP unless changes are made by physician. 30 Day review completed. Medical Director ITP review done, changes made as directed, and signed approval by Medical Director.            Comments:  30 Day review completed. Medical Director ITP review done, changes made as directed, and signed approval by Medical Director.

## 2021-02-23 ENCOUNTER — Other Ambulatory Visit: Payer: Self-pay

## 2021-02-23 ENCOUNTER — Encounter: Payer: Medicare HMO | Admitting: *Deleted

## 2021-02-23 DIAGNOSIS — Z955 Presence of coronary angioplasty implant and graft: Secondary | ICD-10-CM | POA: Diagnosis not present

## 2021-02-23 NOTE — Progress Notes (Signed)
Daily Session Note  Patient Details  Name: Willie Clark MRN: [id number] Date of Birth: [birth year] Referring Provider:   Flowsheet Row Cardiac Rehab from 12/12/2020 in [facility] Cardiac and Pulmonary Rehab  Referring Provider End, Harrell Gave MD       Encounter Date: 02/23/2021  Check In:  Session Check In - 02/23/21 1000       Check-In   Supervising physician immediately available to respond to emergencies See telemetry face sheet for immediately available ER MD    Location [facility]-Cardiac & Pulmonary Rehab    Staff Present Heath Lark, RN, BSN, CCRP;Joseph [place], RCP,RRT,BSRT;Jessica [place], [place], [place], CCRP, CCET    Virtual Visit No    Medication changes reported     No    Fall or balance concerns reported    No    Warm-up and Cool-down Performed on first and last piece of equipment    Resistance Training Performed Yes    VAD Patient? No    PAD/SET Patient? No      Pain Assessment   Currently in Pain? No/denies                Social History   Tobacco Use  Smoking Status Former   Packs/day: 2.50   Types: Cigarettes   Quit date: 08/21/1991   Years since quitting: 29.5  Smokeless Tobacco Never    Goals Met:  Independence with exercise equipment Exercise tolerated well No report of concerns or symptoms today  Goals Unmet:  Not Applicable  Comments: Pt able to follow exercise prescription today without complaint.  Will continue to monitor for progression.    Dr. Emily Filbert is Medical Director for [facility].  Dr. Ottie Glazier is Medical Director for [facility] Pulmonary Rehabilitation.

## 2021-02-26 ENCOUNTER — Other Ambulatory Visit: Payer: Self-pay

## 2021-02-26 ENCOUNTER — Encounter: Payer: Medicare HMO | Admitting: *Deleted

## 2021-02-26 DIAGNOSIS — Z955 Presence of coronary angioplasty implant and graft: Secondary | ICD-10-CM

## 2021-02-26 NOTE — Progress Notes (Signed)
Daily Session Note  Patient Details  Name: Willie Clark MRN: [id number] Date of Birth: 1950-09-02 Referring Provider:   Flowsheet Row Cardiac Rehab from 12/12/2020 in [facility] Cardiac and Pulmonary Rehab  Referring Provider End, Harrell Gave MD       Encounter Date: 02/26/2021  Check In:  Session Check In - 02/26/21 0925       Check-In   Supervising physician immediately available to respond to emergencies See telemetry face sheet for immediately available ER MD    Location [facility]-Cardiac & Pulmonary Rehab    Staff Present Heath Lark, RN, BSN, Laveda Norman, BS, ACSM CEP, Exercise Physiologist;Joseph [place], [place]    Virtual Visit No    Medication changes reported     No    Fall or balance concerns reported    No    Warm-up and Cool-down Performed on first and last piece of equipment    Resistance Training Performed Yes    VAD Patient? No    PAD/SET Patient? No      Pain Assessment   Currently in Pain? No/denies                Social History   Tobacco Use  Smoking Status Former   Packs/day: 2.50   Types: Cigarettes   Quit date: 08/21/1991   Years since quitting: 29.5  Smokeless Tobacco Never    Goals Met:  Independence with exercise equipment Exercise tolerated well No report of concerns or symptoms today  Goals Unmet:  Not Applicable  Comments: Pt able to follow exercise prescription today without complaint.  Will continue to monitor for progression.   [place] Name 12/12/20 0858 02/26/21 0933       6 Minute Walk   Phase Initial Discharge    Distance 1215 feet 1500 feet    Distance % Change -- 23 %    Distance Feet Change -- 285 ft    Walk Time 6 minutes 6 minutes    # of Rest Breaks 0 0    MPH 2.3 2.84    METS 2.85 3.1    RPE 13 15    Perceived Dyspnea  3 1    VO2 Peak 9.96 10.86    Symptoms Yes (comment) No    Comments SOB, ankle pain 10/10, knee pain 8/10 (Chronic pain) --    Resting HR 65 bpm 60 bpm    Resting BP  144/84 110/70    Resting Oxygen Saturation  97 % 95 %    Exercise Oxygen Saturation  during 6 min walk 98 % 95 %    Max Ex. HR 98 bpm 81 bpm    Max Ex. BP 152/64 142/70    2 Minute Post BP 136/64 --                Dr. Emily Filbert is Medical Director for [place].  Dr. Ottie Glazier is Medical Director for [facility] Pulmonary Rehabilitation.

## 2021-02-28 ENCOUNTER — Other Ambulatory Visit: Payer: Self-pay

## 2021-02-28 DIAGNOSIS — Z955 Presence of coronary angioplasty implant and graft: Secondary | ICD-10-CM | POA: Diagnosis not present

## 2021-02-28 DIAGNOSIS — I214 Non-ST elevation (NSTEMI) myocardial infarction: Secondary | ICD-10-CM

## 2021-02-28 NOTE — Progress Notes (Signed)
Daily Session Note  Patient Details  Name: Willie Clark MRN: [id number] Date of Birth: 1951/01/27 Referring Provider:   Flowsheet Row Cardiac Rehab from 12/12/2020 in [facility] Cardiac and Pulmonary Rehab  Referring Provider End, Harrell Gave MD       Encounter Date: 02/28/2021  Check In:  Session Check In - 02/28/21 0915       Check-In   Supervising physician immediately available to respond to emergencies See telemetry face sheet for immediately available ER MD    Location [facility]-Cardiac & Pulmonary Rehab    Staff Present Birdie Sons, MPA, RN;Jessica [place], MA, RCEP, CCRP, CCET;Joseph [place], [place]    Virtual Visit No    Medication changes reported     No    Fall or balance concerns reported    No    Tobacco Cessation No Change    Warm-up and Cool-down Performed on first and last piece of equipment    Resistance Training Performed Yes    VAD Patient? No    PAD/SET Patient? No      Pain Assessment   Currently in Pain? No/denies                Social History   Tobacco Use  Smoking Status Former   Packs/day: 2.50   Types: Cigarettes   Quit date: 08/21/1991   Years since quitting: 29.5  Smokeless Tobacco Never    Goals Met:  Independence with exercise equipment Exercise tolerated well No report of concerns or symptoms today Strength training completed today  Goals Unmet:  Not Applicable  Comments: Pt able to follow exercise prescription today without complaint.  Will continue to monitor for progression.    Dr. Emily Filbert is Medical Director for [place].  Dr. Ottie Glazier is Medical Director for [facility] Pulmonary Rehabilitation.

## 2021-03-01 NOTE — Patient Instructions (Signed)
Discharge Patient Instructions  Patient Details  Name: Willie Clark MRN: [id number] Date of Birth: May 11, 1950 Referring Provider:  Nelva Bush, MD   Number of Visits: 83  Reason for Discharge:  Patient reached a stable level of exercise. Patient independent in their exercise. Patient has met program and personal goals.  Diagnosis:  Status post coronary artery stent placement  Initial Exercise Prescription:  Initial Exercise Prescription - 12/12/20 0800       Date of Initial Exercise RX and Referring Provider   Date 12/12/20    Referring Provider End, Harrell Gave MD      Treadmill   MPH 2.3    Grade 0.5    Minutes 15    METs 2.92      Recumbant Bike   Level 2    RPM 50    Watts 25    Minutes 15    METs 2.5      REL-XR   Level 2    Speed 50    Minutes 15    METs 2.5      T5 Nustep   Level 3    SPM 80    Minutes 15    METs 2.5      Prescription Details   Frequency (times per week) 3    Duration Progress to 30 minutes of continuous aerobic without signs/symptoms of physical distress      Intensity   THRR 40-80% of Max Heartrate 99-134    Ratings of Perceived Exertion 11-13    Perceived Dyspnea 0-4      Progression   Progression Continue to progress workloads to maintain intensity without signs/symptoms of physical distress.      Resistance Training   Training Prescription Yes    Weight 4 lb    Reps 10-15             Discharge Exercise Prescription (Final Exercise Prescription Changes):  Exercise Prescription Changes - 02/26/21 1300       Response to Exercise   Blood Pressure (Admit) 114/62    Blood Pressure (Exit) 118/60    Heart Rate (Admit) 62 bpm    Heart Rate (Exercise) 92 bpm    Heart Rate (Exit) 65 bpm    Rating of Perceived Exertion (Exercise) 13    Symptoms none    Duration Continue with 30 min of aerobic exercise without signs/symptoms of physical distress.    Intensity THRR unchanged      Progression   Progression  Continue to progress workloads to maintain intensity without signs/symptoms of physical distress.    Average METs 3.7      Resistance Training   Training Prescription Yes    Weight 4 lb    Reps 10-15      Interval Training   Interval Training No      Biostep-RELP   Level 7    Minutes 15    METs 4      Home Exercise Plan   Plans to continue exercise at Home (comment)   walking, treadmill   Frequency Add 2 additional days to program exercise sessions.    Initial Home Exercises Provided 12/22/20      Oxygen   Maintain Oxygen Saturation 88% or higher             Functional Capacity:  6 Minute Walk     Row Name 12/12/20 0858 02/26/21 0933       6 Minute Walk   Phase Initial Discharge    Distance 1215  feet 1500 feet    Distance % Change -- 23 %    Distance Feet Change -- 285 ft    Walk Time 6 minutes 6 minutes    # of Rest Breaks 0 0    MPH 2.3 2.84    METS 2.85 3.1    RPE 13 15    Perceived Dyspnea  3 1    VO2 Peak 9.96 10.86    Symptoms Yes (comment) No    Comments SOB, ankle pain 10/10, knee pain 8/10 (Chronic pain) --    Resting HR 65 bpm 60 bpm    Resting BP 144/84 110/70    Resting Oxygen Saturation  97 % 95 %    Exercise Oxygen Saturation  during 6 min walk 98 % 95 %    Max Ex. HR 98 bpm 81 bpm    Max Ex. BP 152/64 142/70    2 Minute Post BP 136/64 --             Nutrition & Weight - Outcomes:  Pre Biometrics - 12/12/20 0754       Pre Biometrics   Height 5' 10.1" (1.781 m)    Weight 208 lb (94.3 kg)    BMI (Calculated) 29.74    Single Leg Stand 13.4 seconds             Post Biometrics - 02/26/21 0935        Post  Biometrics   Single Leg Stand 25.53 seconds             Nutrition:  Nutrition Therapy & Goals - 01/05/21 0928       Nutrition Therapy   RD appointment deferred Yes   [name] was just here a few months ago and does not want to meet with RD this time.   Fiber 30 grams    Whole Grain Foods 3 servings    Saturated  Fats 12 max. grams    Fruits and Vegetables 8 servings/day    Sodium 1.5 grams      Personal Nutrition Goals   Nutrition Goal Lose weight    Comments Focus on heart health eating and good balance      Intervention Plan   Intervention Prescribe, educate and counsel regarding individualized specific dietary modifications aiming towards targeted core components such as weight, hypertension, lipid management, diabetes, heart failure and other comorbidities.    Expected Outcomes Short Term Goal: Understand basic principles of dietary content, such as calories, fat, sodium, cholesterol and nutrients.;Short Term Goal: A plan has been developed with personal nutrition goals set during dietitian appointment.;Long Term Goal: Adherence to prescribed nutrition plan.              Goals reviewed with patient; copy given to patient.

## 2021-03-02 ENCOUNTER — Encounter: Payer: Medicare HMO | Admitting: *Deleted

## 2021-03-02 ENCOUNTER — Other Ambulatory Visit: Payer: Self-pay

## 2021-03-02 DIAGNOSIS — Z955 Presence of coronary angioplasty implant and graft: Secondary | ICD-10-CM | POA: Diagnosis not present

## 2021-03-02 NOTE — Progress Notes (Signed)
Daily Session Note  Patient Details  Name: Willie Clark MRN: [id number] Date of Birth: 1950-09-21 Referring Provider:   Flowsheet Row Cardiac Rehab from 12/12/2020 in [facility] Cardiac and Pulmonary Rehab  Referring Provider End, Harrell Gave MD       Encounter Date: 03/02/2021  Check In:  Session Check In - 03/02/21 0925       Check-In   Supervising physician immediately available to respond to emergencies See telemetry face sheet for immediately available ER MD    Location [facility]-Cardiac & Pulmonary Rehab    Staff Present Renita Papa, RN BSN;Joseph [place], RCP,RRT,BSRT;Laureen [place], [place], RRT, CPFT    Virtual Visit No    Medication changes reported     No    Fall or balance concerns reported    No    Warm-up and Cool-down Performed on first and last piece of equipment    Resistance Training Performed Yes    VAD Patient? No    PAD/SET Patient? No      Pain Assessment   Currently in Pain? No/denies                Social History   Tobacco Use  Smoking Status Former   Packs/day: 2.50   Types: Cigarettes   Quit date: 08/21/1991   Years since quitting: 29.5  Smokeless Tobacco Never    Goals Met:  Independence with exercise equipment Exercise tolerated well No report of concerns or symptoms today Strength training completed today  Goals Unmet:  Not Applicable  Comments: Pt able to follow exercise prescription today without complaint.  Will continue to monitor for progression.    Dr. Emily Filbert is Medical Director for [place].  Dr. Ottie Glazier is Medical Director for [facility] Pulmonary Rehabilitation.

## 2021-03-05 ENCOUNTER — Encounter: Payer: Medicare HMO | Admitting: *Deleted

## 2021-03-05 ENCOUNTER — Other Ambulatory Visit: Payer: Self-pay

## 2021-03-05 ENCOUNTER — Other Ambulatory Visit: Payer: Self-pay | Admitting: Cardiovascular Disease

## 2021-03-05 DIAGNOSIS — Z955 Presence of coronary angioplasty implant and graft: Secondary | ICD-10-CM

## 2021-03-05 NOTE — Progress Notes (Signed)
Daily Session Note  Patient Details  Name: Willie Clark MRN: [id number] Date of Birth: February 09, 1951 Referring Provider:   Flowsheet Row Cardiac Rehab from 12/12/2020 in [facility] Cardiac and Pulmonary Rehab  Referring Provider End, Harrell Gave MD       Encounter Date: 03/05/2021  Check In:  Session Check In - 03/05/21 0923       Check-In   Supervising physician immediately available to respond to emergencies See telemetry face sheet for immediately available ER MD    Location [facility]-Cardiac & Pulmonary Rehab    Staff Present Heath Lark, RN, BSN, Laveda Norman, BS, ACSM CEP, Exercise Physiologist;Joseph [place], [place]    Virtual Visit No    Medication changes reported     No    Fall or balance concerns reported    No    Warm-up and Cool-down Performed on first and last piece of equipment    Resistance Training Performed Yes    VAD Patient? No    PAD/SET Patient? No      Pain Assessment   Currently in Pain? No/denies                Social History   Tobacco Use  Smoking Status Former   Packs/day: 2.50   Types: Cigarettes   Quit date: 08/21/1991   Years since quitting: 29.5  Smokeless Tobacco Never    Goals Met:  Independence with exercise equipment Exercise tolerated well No report of concerns or symptoms today  Goals Unmet:  Not Applicable  Comments: Pt able to follow exercise prescription today without complaint.  Will continue to monitor for progression.    Dr. Emily Filbert is Medical Director for [place].  Dr. Ottie Glazier is Medical Director for [facility] Pulmonary Rehabilitation.

## 2021-03-06 ENCOUNTER — Other Ambulatory Visit: Payer: Self-pay

## 2021-03-06 ENCOUNTER — Ambulatory Visit
Admission: RE | Admit: 2021-03-06 | Discharge: 2021-03-06 | Disposition: A | Discharge status: Home / Self Care | Payer: Medicare HMO | Source: Ambulatory Visit | Attending: Gastroenterology | Admitting: Gastroenterology

## 2021-03-06 DIAGNOSIS — Z8719 Personal history of other diseases of the digestive system: Secondary | ICD-10-CM | POA: Diagnosis present

## 2021-03-06 DIAGNOSIS — N200 Calculus of kidney: Secondary | ICD-10-CM | POA: Insufficient documentation

## 2021-03-06 DIAGNOSIS — K429 Umbilical hernia without obstruction or gangrene: Secondary | ICD-10-CM | POA: Diagnosis not present

## 2021-03-06 DIAGNOSIS — K4090 Unilateral inguinal hernia, without obstruction or gangrene, not specified as recurrent: Secondary | ICD-10-CM | POA: Insufficient documentation

## 2021-03-06 DIAGNOSIS — K5730 Diverticulosis of large intestine without perforation or abscess without bleeding: Secondary | ICD-10-CM | POA: Diagnosis not present

## 2021-03-06 DIAGNOSIS — I700 Atherosclerosis of aorta: Secondary | ICD-10-CM | POA: Insufficient documentation

## 2021-03-06 DIAGNOSIS — K760 Fatty (change of) liver, not elsewhere classified: Secondary | ICD-10-CM | POA: Insufficient documentation

## 2021-03-06 LAB — POCT I-STAT CREATININE: Creatinine, Ser: 1.2 mg/dL (ref 0.61–1.24)

## 2021-03-06 MED ORDER — IOHEXOL 300 MG/ML  SOLN
100.0000 mL | Freq: Once | INTRAMUSCULAR | Status: AC | PRN
  Administered 2021-03-06: 100 mL via INTRAVENOUS

## 2021-03-07 DIAGNOSIS — Z955 Presence of coronary angioplasty implant and graft: Secondary | ICD-10-CM

## 2021-03-07 DIAGNOSIS — I214 Non-ST elevation (NSTEMI) myocardial infarction: Secondary | ICD-10-CM

## 2021-03-07 NOTE — Progress Notes (Signed)
Daily Session Note  Patient Details  Name: Willie Clark MRN: [id number] Date of Birth: July 18, 1950 Referring Provider:   Flowsheet Row Cardiac Rehab from 12/12/2020 in [facility] Cardiac and Pulmonary Rehab  Referring Provider End, Harrell Gave MD       Encounter Date: 03/07/2021  Check In:  Session Check In - 03/07/21 0913       Check-In   Supervising physician immediately available to respond to emergencies See telemetry face sheet for immediately available ER MD    Location [facility]-Cardiac & Pulmonary Rehab    Staff Present Birdie Sons, MPA, Elveria Rising, BA, ACSM CEP, Exercise Physiologist;Joseph Tessie [place], [place]    Virtual Visit No    Medication changes reported     No    Fall or balance concerns reported    No    Tobacco Cessation No Change    Warm-up and Cool-down Performed on first and last piece of equipment    Resistance Training Performed Yes    VAD Patient? No    PAD/SET Patient? No      Pain Assessment   Currently in Pain? No/denies                Social History   Tobacco Use  Smoking Status Former   Packs/day: 2.50   Types: Cigarettes   Quit date: 08/21/1991   Years since quitting: 29.5  Smokeless Tobacco Never    Goals Met:  Independence with exercise equipment Exercise tolerated well No report of concerns or symptoms today Strength training completed today  Goals Unmet:  Not Applicable  Comments: Pt able to follow exercise prescription today without complaint.  Will continue to monitor for progression.    Dr. Emily Filbert is Medical Director for [place].  Dr. Ottie Glazier is Medical Director for [facility] Pulmonary Rehabilitation.

## 2021-03-09 ENCOUNTER — Encounter: Payer: Medicare HMO | Admitting: *Deleted

## 2021-03-09 ENCOUNTER — Other Ambulatory Visit: Payer: Self-pay

## 2021-03-09 DIAGNOSIS — Z955 Presence of coronary angioplasty implant and graft: Secondary | ICD-10-CM

## 2021-03-09 NOTE — Progress Notes (Signed)
Cardiac Individual Treatment Plan  Patient Details  Name: Willie Clark MRN: [id number] Date of Birth: 01-Jul-1950 Referring Provider:   Flowsheet Row Cardiac Rehab from 12/12/2020 in [facility] Cardiac and Pulmonary Rehab  Referring Provider End, Harrell Gave MD       Initial Encounter Date:  Flowsheet Row Cardiac Rehab from 12/12/2020 in [facility] Cardiac and Pulmonary Rehab  Date 12/12/20       Visit Diagnosis: Status post coronary artery stent placement  Patient's Home Medications on Admission:  Current Outpatient Medications:    albuterol (PROVENTIL HFA;VENTOLIN HFA) 108 (90 Base) MCG/ACT inhaler, Inhale into the lungs every 4 (four) hours as needed for wheezing or shortness of breath., Disp: , Rfl:    aspirin EC 81 MG EC tablet, Take 1 tablet (81 mg total) by mouth daily. Swallow whole., Disp: 30 tablet, Rfl: 11   carvedilol (COREG) 3.125 MG tablet, TAKE 1 TABLET (3.125 MG TOTAL) BY MOUTH 2 (TWO) TIMES DAILY WITH A MEAL., Disp: 180 tablet, Rfl: 0   citalopram (CELEXA) 20 MG tablet, Take 20 mg by mouth daily. , Disp: , Rfl:    clopidogrel (PLAVIX) 75 MG tablet, TAKE (4 TABLETS) 300 MG STARTING FRI 12/08/20, THEN STARTING THE NEXT DAY TAKE (1) TABLET 75 MG DAILY., Disp: 90 tablet, Rfl: 1   docusate calcium (SURFAK) 240 MG capsule, Take 240 mg by mouth daily., Disp: , Rfl:    empagliflozin (JARDIANCE) 10 MG TABS tablet, Take 1 tablet (10 mg total) by mouth daily before breakfast., Disp: 30 tablet, Rfl: 5   EPINEPHrine 0.3 mg/0.3 mL IJ SOAJ injection, Inject 0.3 mg into the muscle as needed. , Disp: , Rfl:    fexofenadine (ALLEGRA) 180 MG tablet, Take 180 mg by mouth daily., Disp: , Rfl:    finasteride (PROSCAR) 5 MG tablet, Take 5 mg by mouth daily., Disp: , Rfl:    gabapentin (NEURONTIN) 800 MG tablet, Take 1 tablet by mouth 3 (three) times daily., Disp: , Rfl:    isosorbide mononitrate (IMDUR) 60 MG 24 hr tablet, Take 1 tablet (60 mg total) by mouth daily., Disp: 90 tablet, Rfl: 3   KLOR-CON  M20 20 MEQ tablet, TAKE 1 TABLET BY MOUTH EVERY DAY, Disp: 90 tablet, Rfl: 0   losartan (COZAAR) 25 MG tablet, TAKE 1 TABLET EVERY DAY, Disp: 90 tablet, Rfl: 0   mesalamine (APRISO) 0.375 g 24 hr capsule, Take 1.4 mg by mouth daily., Disp: , Rfl:    Multiple Vitamins-Minerals (HAIR SKIN AND NAILS FORMULA PO), Take 1 capsule by mouth daily., Disp: , Rfl:    NALTREXONE HCL PO, Take 2.5 mg by mouth daily., Disp: , Rfl:    nitroGLYCERIN (NITROSTAT) 0.4 MG SL tablet, Place 1 tablet (0.4 mg total) under the tongue every 5 (five) minutes as needed for chest pain., Disp: 90 tablet, Rfl: 1   ondansetron (ZOFRAN) 4 MG tablet, Take 4 mg by mouth every 8 (eight) hours as needed for nausea or vomiting., Disp: , Rfl:    pantoprazole (PROTONIX) 40 MG tablet, Take 40 mg by mouth 2 (two) times daily. , Disp: , Rfl:    Peppermint Oil (IBGARD PO), Take by mouth daily at 2 am., Disp: , Rfl:    Probiotic Product (PROBIOTIC PO), Take by mouth daily at 8 pm., Disp: , Rfl:    rOPINIRole (REQUIP) 1 MG tablet, Take 1 mg by mouth 2 (two) times daily., Disp: , Rfl:    rosuvastatin (CRESTOR) 40 MG tablet, TAKE 1 TABLET BY MOUTH EVERY DAY,  Disp: 90 tablet, Rfl: 3   spironolactone (ALDACTONE) 25 MG tablet, TAKE 1 TABLET EVERY DAY, Disp: 90 tablet, Rfl: 1   torsemide (DEMADEX) 20 MG tablet, Take 2 tablets (40 mg total) by mouth daily., Disp: 180 tablet, Rfl: 0   traMADol (ULTRAM) 50 MG tablet, Take 50 mg by mouth every 6 (six) hours as needed., Disp: , Rfl:    VASCEPA 1 g capsule, TAKE 2 CAPSULES TWICE DAILY, Disp: 360 capsule, Rfl: 1   vitamin B-12 (CYANOCOBALAMIN) 1000 MCG tablet, Take 1,000 mcg by mouth daily., Disp: , Rfl:   Past Medical History: Past Medical History:  Diagnosis Date   Anxiety    Arthritis    Barrett's esophagus    Bowel obstruction ([place]) 02/2009   small bowel   CHF (congestive heart failure) (HCC)    Chronic chest pain    Coronary artery disease    a. 1993 CABG; b. 2007 Redo CABG; c. 09/2015 Cath:  LM mild dzs, LAD 100ost, RI mild dzs, LCX 60p, OM1 100, RCA 100ost, VG->OM2 min irregs, LIMA->LAD nl, VG->RPDA 30ost, 20d, nl EF->Med rx.   Depression    Diverticulitis    Diverticulosis    Erectile dysfunction    Fibromyalgia    Gastric ulcer    Gastritis    GERD (gastroesophageal reflux disease)    Headache    Hemorrhoids    HFrEF (heart failure with reduced ejection fraction) ([place])    a. 09/2016 Echo: EF 55-60%, no rwma, triv AI, mild MR, mildly dil LA. -> as of April 2020: EF 40 to 45%.  Septal dyssynergy/hypokinesis due to postop state but otherwise unable to assess wall motion [name] due to poor study.   History of myocardial infarct at age less than 70 years    Hyperlipidemia    Hypertension    Kidney stones    going to [facility]  June 26 to see kidney specialist   Lightheadedness    Mass on back    Nephrolithiasis    Orthostatic hypotension    a. Improved after discontinuation of metoprolol.   OSA (obstructive sleep apnea)    a. On CPAP.   Pulmonary embolism ([place]) 01/2006   Post-op/treated   Skin lesions, generalized    Stroke [facility]) 1993   right side weakness no blood thinners   on Aspirin   Syncope and collapse    Urticaria     Tobacco Use: Social History   Tobacco Use  Smoking Status Former   Packs/day: 2.50   Types: Cigarettes   Quit date: 08/21/1991   Years since quitting: 29.5  Smokeless Tobacco Never    Labs: Recent Review Flowsheet Data     Labs for ITP Cardiac and Pulmonary Rehab Latest Ref Rng & Units 08/01/2018 12/11/2019 12/13/2019 02/07/2020 11/14/2020   Cholestrol 0 - 200 mg/dL 154 - 181 165 -   LDLCALC 0 - 99 mg/dL 56 - 84 61 -   HDL >40 mg/dL 41 - 35(L) 41 -   Trlycerides <150 mg/dL 285(H) - 312(H) 317(H) -   Hemoglobin A1c 4.8 - 5.6 % - 6.0(H) - - 6.1(H)        Exercise Target Goals: Exercise Program Goal: Individual exercise prescription set using results from initial 6 min walk test and THRR while considering  patient's activity  barriers and safety.   Exercise Prescription Goal: Initial exercise prescription builds to 30-45 minutes a day of aerobic activity, 2-3 days per week.  Home exercise guidelines will be given to patient  during program as part of exercise prescription that the participant will acknowledge.   Education: Aerobic Exercise: - Group verbal and visual presentation on the components of exercise prescription. Introduces F.I.T.T principle from ACSM for exercise prescriptions.  Reviews F.I.T.T. principles of aerobic exercise including progression. Written material given at graduation. Flowsheet Row Cardiac Rehab from 09/13/2020 in [facility] Cardiac [facility] Pulmonary Rehab  Education need identified 07/12/20  Date 08/09/20  Educator AS  Instruction Review Code 1- Verbalizes Understanding       Education: Resistance Exercise: - Group verbal and visual presentation on the components of exercise prescription. Introduces F.I.T.T principle from ACSM for exercise prescriptions  Reviews F.I.T.T. principles of resistance exercise including progression. Written material given at graduation. Flowsheet Row Cardiac Rehab from 09/13/2020 in [facility] Cardiac and Pulmonary Rehab  Date 08/16/20  Educator AS  Instruction Review Code 1- Verbalizes Understanding        Education: Exercise & Equipment Safety: - Individual verbal instruction and demonstration of equipment use and safety with use of the equipment. Flowsheet Row Cardiac Rehab from 01/17/2021 in [facility] Cardiac and Pulmonary Rehab  Date 12/12/20  Educator [facility]  Instruction Review Code 1- Verbalizes Understanding       Education: Exercise Physiology & General Exercise Guidelines: - Group verbal and written instruction with models to review the exercise physiology of the cardiovascular system and associated critical values. Provides general exercise guidelines with specific guidelines to those with heart or lung disease.  Flowsheet Row Cardiac Rehab from 09/13/2020 in  [facility] Cardiac and Pulmonary Rehab  Date 08/02/20  Educator [facility]  Instruction Review Code 1- Verbalizes Understanding       Education: Flexibility, Balance, Mind/Body Relaxation: - Group verbal and visual presentation with interactive activity on the components of exercise prescription. Introduces F.I.T.T principle from ACSM for exercise prescriptions. Reviews F.I.T.T. principles of flexibility and balance exercise training including progression. Also discusses the mind body connection.  Reviews various relaxation techniques to help reduce and manage stress (i.e. Deep breathing, progressive muscle relaxation, and visualization). Balance handout provided to take home. Written material given at graduation. Flowsheet Row Cardiac Rehab from 01/17/2021 in [facility] Cardiac and Pulmonary Rehab  Education need identified 12/12/20       Activity Barriers & Risk Stratification:  Activity Barriers & Cardiac Risk Stratification - 12/12/20 0747       Activity Barriers & Cardiac Risk Stratification   Activity Barriers Arthritis;Back Problems;Neck/Spine Problems;Assistive Device;Deconditioning;Muscular Weakness;Shortness of Breath;Balance Concerns;Fibromyalgia    Cardiac Risk Stratification High             6 Minute Walk:  6 Minute Walk     Row Name 12/12/20 0858 02/26/21 0933       6 Minute Walk   Phase Initial Discharge    Distance 1215 feet 1500 feet    Distance % Change -- 23 %    Distance Feet Change -- 285 ft    Walk Time 6 minutes 6 minutes    # of Rest Breaks 0 0    MPH 2.3 2.84    METS 2.85 3.1    RPE 13 15    Perceived Dyspnea  3 1    VO2 Peak 9.96 10.86    Symptoms Yes (comment) No    Comments SOB, ankle pain 10/10, knee pain 8/10 (Chronic pain) --    Resting HR 65 bpm 60 bpm    Resting BP 144/84 110/70    Resting Oxygen Saturation  97 % 95 %    Exercise Oxygen  Saturation  during 6 min walk 98 % 95 %    Max Ex. HR 98 bpm 81 bpm    Max Ex. BP 152/64 142/70    2 Minute Post  BP 136/64 --             Oxygen Initial Assessment:   Oxygen Re-Evaluation:   Oxygen Discharge (Final Oxygen Re-Evaluation):   Initial Exercise Prescription:  Initial Exercise Prescription - 12/12/20 0800       Date of Initial Exercise RX and Referring Provider   Date 12/12/20    Referring Provider End, Harrell Gave MD      Treadmill   MPH 2.3    Grade 0.5    Minutes 15    METs 2.92      Recumbant Bike   Level 2    RPM 50    Watts 25    Minutes 15    METs 2.5      REL-XR   Level 2    Speed 50    Minutes 15    METs 2.5      T5 Nustep   Level 3    SPM 80    Minutes 15    METs 2.5      Prescription Details   Frequency (times per week) 3    Duration Progress to 30 minutes of continuous aerobic without signs/symptoms of physical distress      Intensity   THRR 40-80% of Max Heartrate 99-134    Ratings of Perceived Exertion 11-13    Perceived Dyspnea 0-4      Progression   Progression Continue to progress workloads to maintain intensity without signs/symptoms of physical distress.      Resistance Training   Training Prescription Yes    Weight 4 lb    Reps 10-15             Perform Capillary Blood Glucose checks as needed.  Exercise Prescription Changes:   Exercise Prescription Changes     Row Name 12/12/20 0800 12/18/20 1400 12/22/20 1000 01/02/21 1200 01/16/21 0800     Response to Exercise   Blood Pressure (Admit) 144/84 146/70 -- 110/62 118/62   Blood Pressure (Exercise) 152/64 128/78 -- 130/62 --   Blood Pressure (Exit) 136/64 124/64 -- 104/62 110/70   Heart Rate (Admit) 65 bpm 59 bpm -- 68 bpm 97 bpm   Heart Rate (Exercise) 98 bpm 97 bpm -- 95 bpm 112 bpm   Heart Rate (Exit) 72 bpm 55 bpm -- 77 bpm 84 bpm   Oxygen Saturation (Admit) 97 % -- -- -- --   Oxygen Saturation (Exercise) 98 % -- -- -- --   Rating of Perceived Exertion (Exercise) 13 13 -- 13 13   Perceived Dyspnea (Exercise) 3 -- -- -- --   Symptoms SOB, ankle and knee  pain none -- none none   Comments walk test results third full day of exercise -- -- --   Duration -- Continue with 30 min of aerobic exercise without signs/symptoms of physical distress. -- Continue with 30 min of aerobic exercise without signs/symptoms of physical distress. Continue with 30 min of aerobic exercise without signs/symptoms of physical distress.   Intensity -- THRR unchanged -- THRR unchanged THRR unchanged     Progression   Progression -- Continue to progress workloads to maintain intensity without signs/symptoms of physical distress. -- Continue to progress workloads to maintain intensity without signs/symptoms of physical distress. Continue to progress workloads to maintain intensity without signs/symptoms  of physical distress.   Average METs -- 2.94 -- 2.6 3.73     Resistance Training   Training Prescription -- Yes -- Yes Yes   Weight -- 4 lb -- 4 lb 4 lb   Reps -- 10-15 -- 10-15 10-15     Interval Training   Interval Training -- No -- No No     Treadmill   MPH -- 2.3 -- -- 2.4   Grade -- 0.5 -- -- 4.5   Minutes -- 15 -- -- 15   METs -- 2.92 -- -- 4.33     Recumbant Bike   Level -- 7 -- 7 10   Watts -- 26 -- 33 43   Minutes -- 15 -- 15 15   METs -- 2.83 -- 2.9 4     REL-XR   Level -- 6 -- -- 8   Minutes -- 15 -- -- 15   METs -- 3.7 -- -- 4.5     T5 Nustep   Level -- 6 -- 6 7   Minutes -- 15 -- 15 15   METs -- 2.3 -- 2.3 2.5     Home Exercise Plan   Plans to continue exercise at -- -- Home (comment)  walking, treadmill -- Home (comment)  walking, treadmill   Frequency -- -- Add 2 additional days to program exercise sessions. -- Add 2 additional days to program exercise sessions.   Initial Home Exercises Provided -- -- 12/22/20 -- 12/22/20     Oxygen   Maintain Oxygen Saturation -- -- -- -- 88% or higher    Row Name 01/29/21 1400 02/12/21 1100 02/26/21 1300         Response to Exercise   Blood Pressure (Admit) 124/70 116/72 114/62     Blood  Pressure (Exit) 126/70 120/60 118/60     Heart Rate (Admit) 61 bpm 58 bpm 62 bpm     Heart Rate (Exercise) 89 bpm 90 bpm 92 bpm     Heart Rate (Exit) 66 bpm 69 bpm 65 bpm     Rating of Perceived Exertion (Exercise) _0 Perceived Dyspnea (Exercise) 1 -- --     Symptoms none none none     Duration Continue with 30 min of aerobic exercise without signs/symptoms of physical distress. Continue with 30 min of aerobic exercise without signs/symptoms of physical distress. Continue with 30 min of aerobic exercise without signs/symptoms of physical distress.     Intensity THRR unchanged THRR unchanged THRR unchanged       Progression   Progression Continue to progress workloads to maintain intensity without signs/symptoms of physical distress. Continue to progress workloads to maintain intensity without signs/symptoms of physical distress. Continue to progress workloads to maintain intensity without signs/symptoms of physical distress.     Average METs 3.7 3.04 3.7       Resistance Training   Training Prescription Yes Yes Yes     Weight 4 lb 4 lb 4 lb     Reps 10-15 10-15 10-15       Interval Training   Interval Training No No No       Recumbant Bike   Level -- 9 --     Watts -- 40 --     Minutes -- 15 --     METs -- 2.83 --       T5 Nustep   Level -- 7 --     Minutes -- 15 --  METs -- 2.6 --       Biostep-RELP   Level _0 Minutes _1 METs _2 Track   Laps -- 44 --     Minutes -- 15 --     METs -- 3.39 --       Home Exercise Plan   Plans to continue exercise at Home (comment)  walking, treadmill Home (comment)  walking, treadmill Home (comment)  walking, treadmill     Frequency Add 2 additional days to program exercise sessions. Add 2 additional days to program exercise sessions. Add 2 additional days to program exercise sessions.     Initial Home Exercises Provided 12/22/20 12/22/20 12/22/20       Oxygen   Maintain Oxygen Saturation -- 88% or  higher 88% or higher              Exercise Comments:   Exercise Comments     Row Name 12/13/20 3832           Exercise Comments First full day of exercise!  Patient was oriented to gym and equipment including functions, settings, policies, and procedures.  Patient's individual exercise prescription and treatment plan were reviewed.  All starting workloads were established based on the results of the 6 minute walk test done at initial orientation visit.  The plan for exercise progression was also introduced and progression will be customized based on patient's performance and goals.                Exercise Goals and Review:   Exercise Goals     Row Name 12/12/20 0756             Exercise Goals   Increase Physical Activity Yes       Intervention Provide advice, education, support and counseling about physical activity/exercise needs.;Develop an individualized exercise prescription for aerobic and resistive training based on initial evaluation findings, risk stratification, comorbidities and participant's personal goals.       Expected Outcomes Short Term: Attend rehab on a regular basis to increase amount of physical activity.;Long Term: Add in home exercise to make exercise part of routine and to increase amount of physical activity.;Long Term: Exercising regularly at least 3-5 days a week.       Increase Strength and Stamina Yes       Intervention Provide advice, education, support and counseling about physical activity/exercise needs.;Develop an individualized exercise prescription for aerobic and resistive training based on initial evaluation findings, risk stratification, comorbidities and participant's personal goals.       Expected Outcomes Short Term: Increase workloads from initial exercise prescription for resistance, speed, and METs.;Short Term: Perform resistance training exercises routinely during rehab and add in resistance training at home;Long Term: Improve  cardiorespiratory fitness, muscular endurance and strength as measured by increased METs and functional capacity (6MWT)       Able to understand and use rate of perceived exertion (RPE) scale Yes       Intervention Provide education and explanation on how to use RPE scale       Expected Outcomes Short Term: Able to use RPE daily in rehab to express subjective intensity level;Long Term:  Able to use RPE to guide intensity level when exercising independently       Able to understand and use Dyspnea scale Yes       Intervention Provide education and explanation on how  to use Dyspnea scale       Expected Outcomes Short Term: Able to use Dyspnea scale daily in rehab to express subjective sense of shortness of breath during exertion;Long Term: Able to use Dyspnea scale to guide intensity level when exercising independently       Knowledge and understanding of Target Heart Rate Range (THRR) Yes       Intervention Provide education and explanation of THRR including how the numbers were predicted and where they are located for reference       Expected Outcomes Short Term: Able to state/look up THRR;Long Term: Able to use THRR to govern intensity when exercising independently;Short Term: Able to use daily as guideline for intensity in rehab       Able to check pulse independently Yes       Intervention Provide education and demonstration on how to check pulse in carotid and radial arteries.;Review the importance of being able to check your own pulse for safety during independent exercise       Expected Outcomes Short Term: Able to explain why pulse checking is important during independent exercise;Long Term: Able to check pulse independently and accurately       Understanding of Exercise Prescription Yes       Intervention Provide education, explanation, and written materials on patient's individual exercise prescription       Expected Outcomes Short Term: Able to explain program exercise prescription;Long  Term: Able to explain home exercise prescription to exercise independently                Exercise Goals Re-Evaluation :  Exercise Goals Re-Evaluation     Row Name 12/13/20 4081 12/18/20 1413 12/22/20 1024 01/02/21 1242 01/05/21 0925     Exercise Goal Re-Evaluation   Exercise Goals Review Increase Physical Activity;Able to understand and use rate of perceived exertion (RPE) scale;Knowledge and understanding of Target Heart Rate Range (THRR);Understanding of Exercise Prescription;Increase Strength and Stamina;Able to understand and use Dyspnea scale;Able to check pulse independently Increase Physical Activity;Increase Strength and Stamina;Understanding of Exercise Prescription Increase Physical Activity;Increase Strength and Stamina;Understanding of Exercise Prescription Increase Physical Activity;Increase Strength and Stamina Increase Physical Activity;Increase Strength and Stamina;Understanding of Exercise Prescription   Comments Reviewed RPE and dyspnea scales, THR and program prescription with pt today.  Pt voiced understanding and was given a copy of goals to take home. [name] is off to a good start returning to rehab.  He has completed his first three full days already thus far.  He is already up to level 7 on the bike and level 6 on the T5 NuStep.  We will continue to monitor his progress. Updated home exercise with pt today.  Pt plans to walk and use treadmill at home for exercise.  We also talked about using staff videos for home use.  Reviewed THR, pulse, RPE, sign and symptoms, pulse oximetery and when to call 911 or MD.  Also discussed weather considerations and indoor options.  Pt voiced understanding. [name] continues to do well with exercise.  He has increased levels on seated machines.  Staff will encourage increasing Tm also. [name] is doing well in rehab.  His diverticulitis is flared up currently, reducing his ability to exert.  He is trying to stay active at home.  He is starting to  regain his strength and his wife keeps him busy.   Expected Outcomes Short: Use RPE daily to regulate intensity. Long: Follow program prescription in THR. Short: Continue to attend rehab regularly  Long: Continue to follow program prescription Short: Continue to use treadmill on off days Long: Continue to improve stamina. Short: increase levels on TM Long:increase MET level Short: Get back to pushing again after flare up Long: COntinue to improve stamina    Row Name 01/16/21 0808 01/29/21 1448 02/05/21 0931 02/12/21 1112 02/23/21 0926     Exercise Goal Re-Evaluation   Exercise Goals Review Increase Physical Activity;Increase Strength and Stamina;Understanding of Exercise Prescription Increase Physical Activity;Increase Strength and Stamina Increase Physical Activity;Increase Strength and Stamina Increase Physical Activity;Increase Strength and Stamina Increase Physical Activity;Increase Strength and Stamina;Understanding of Exercise Prescription   Comments [name] is doing well in rehab.  He is now doing level 10 on the bike and level 7 on the T5.  We will continue to monitor his progress. [name] attends consistently and works in correct RPE.  He is up to 43 laps on the track!  Staff will encourage trying 5 lb for strength. [name] is consistently coming to rehab 3 times per week. He feels like his SOB was getting better but is not coming back and that is his main limiting factor. He made some progress on levels on machines but feels like he has leveled off due to his SOB limiting him. 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 [name] is continuing to do well in rehab. He has reached a highest number of 44 laps on the track, to equal a little over 1 mile. He should be encouraged to increase to 5 lbs for his handweights. Will continue to monitor. [name] is nearing graduation.  He should improve on his post 6MWT test next week.  He is planning to  continue to walk with his dog after graduation.  He is feeling better overall.   Expected Outcomes Short: Continue to push up on workloads Long: Continue to improve strength Short: continue to attend consistently Long: continue to build stamina Short: continue to attend consistently Long: continue to build stamina and improve SOB Short: Increase to 5 lbs handweights Long: Continue to increase overall MET level and strength Short: Improve post 6MWT Long: Continue to improve stamina    Row Name 02/26/21 1344             Exercise Goal Re-Evaluation   Exercise Goals Review Increase Physical Activity;Increase Strength and Stamina       Comments [name] did improve walk test by 23% ! He will complete the program in 4 more sessions.       Expected Outcomes Short:complete HT Long:maintain exercise on his own                Discharge Exercise Prescription (Final Exercise Prescription Changes):  Exercise Prescription Changes - 02/26/21 1300       Response to Exercise   Blood Pressure (Admit) 114/62    Blood Pressure (Exit) 118/60    Heart Rate (Admit) 62 bpm    Heart Rate (Exercise) 92 bpm    Heart Rate (Exit) 65 bpm    Rating of Perceived Exertion (Exercise) 13    Symptoms none    Duration Continue with 30 min of aerobic exercise without signs/symptoms of physical distress.    Intensity THRR unchanged      Progression   Progression Continue to progress workloads to maintain intensity without signs/symptoms of physical distress.    Average METs 3.7      Resistance Training   Training  Prescription Yes    Weight 4 lb    Reps 10-15      Interval Training   Interval Training No      Biostep-RELP   Level 7    Minutes 15    METs 4      Home Exercise Plan   Plans to continue exercise at Home (comment)   walking, treadmill   Frequency Add 2 additional days to program exercise sessions.    Initial Home Exercises Provided 12/22/20      Oxygen   Maintain Oxygen Saturation 88% or  higher             Nutrition:  Target Goals: Understanding of nutrition guidelines, daily intake of sodium <1546m, cholesterol <2076m calories 30% from fat and 7% or less from saturated fats, daily to have 5 or more servings of fruits and vegetables.  Education: All About Nutrition: -Group instruction provided by verbal, written material, interactive activities, discussions, models, and posters to present general guidelines for heart healthy nutrition including fat, fiber, MyPlate, the role of sodium in heart healthy nutrition, utilization of the nutrition label, and utilization of this knowledge for meal planning. Follow up email sent as well. Written material given at graduation. [facility]  Education need identified 12/12/20       Biometrics:  Pre Biometrics - 12/12/20 0754       Pre Biometrics   Height 5' 10.1" (1.781 m)    Weight 208 lb (94.3 kg)    BMI (Calculated) 29.74    Single Leg Stand 13.4 seconds             Post Biometrics - 02/26/21 0935        Post  Biometrics   Single Leg Stand 25.53 seconds             Nutrition Therapy Plan and Nutrition Goals:  Nutrition Therapy & Goals - 01/05/21 0928       Nutrition Therapy   RD appointment deferred Yes   DaShanon Browas just here a few months ago and does not want to meet with RD this time.   Fiber 30 grams    Whole Grain Foods 3 servings    Saturated Fats 12 max. grams    Fruits and Vegetables 8 servings/day    Sodium 1.5 grams      Personal Nutrition Goals   Nutrition Goal Lose weight    Comments Focus on heart health eating and good balance      Intervention Plan   Intervention Prescribe, educate and counsel regarding individualized specific dietary modifications aiming towards targeted core components such as weight, hypertension, lipid management, diabetes, heart failure and other comorbidities.    Expected Outcomes Short Term  Goal: Understand basic principles of dietary content, such as calories, fat, sodium, cholesterol and nutrients.;Short Term Goal: A plan has been developed with personal nutrition goals set during dietitian appointment.;Long Term Goal: Adherence to prescribed nutrition plan.             Nutrition Assessments:  MEDIFICTS Score Key: ?70 Need to make dietary changes  40-70 Heart Healthy Diet ? 40 Therapeutic Level Cholesterol Diet  [facility]  Picture Your Plate Total Score on Admission 70  Picture Your Plate Total Score on Discharge 67      Picture Your Plate Scores: <4<73nhealthy dietary pattern with much room for improvement. 41-50 Dietary  pattern unlikely to meet recommendations for good health and room for improvement. 51-60 More healthful dietary pattern, with some room for improvement.  >60 Healthy dietary pattern, although there may be some specific behaviors that could be improved.    Nutrition Goals Re-Evaluation:  Nutrition Goals Re-Evaluation     [place] Name 02/05/21 0941 02/23/21 0928           Goals   Nutrition Goal Lose weight Short:  eat balanced amount of carbs fat and protein as recommended by RD Long:  manage weight on his own      Comment [name] reports that his weight has been steady. He continues to try to eat a balance diet. He does not use much dressing when eating salad and continues to watch his sodium intake. [name] is doing well with his diet.   He is aiming for more balance but does cheat on occasion.  He is trying to get enough protein.  He says that if it taste good you shouldn't eat it.  He continue to aim for heart healthy.      Expected Outcome Short:  eat balanced amount of carbs fat and protein as recommended by RD Long:  manage weight on his own Continue to focus on heart healthy eating               Nutrition Goals Discharge (Final Nutrition Goals Re-Evaluation):  Nutrition  Goals Re-Evaluation - 02/23/21 0928       Goals   Nutrition Goal Short:  eat balanced amount of carbs fat and protein as recommended by RD Long:  manage weight on his own    Comment [name] is doing well with his diet.   He is aiming for more balance but does cheat on occasion.  He is trying to get enough protein.  He says that if it taste good you shouldn't eat it.  He continue to aim for heart healthy.    Expected Outcome Continue to focus on heart healthy eating             Psychosocial: Target Goals: Acknowledge presence or absence of significant depression and/or stress, maximize coping skills, provide positive support system. Participant is able to verbalize types and ability to use techniques and skills needed for reducing stress and depression.   Education: Stress, Anxiety, and Depression - Group verbal and visual presentation to define topics covered.  Reviews how body is impacted by stress, anxiety, and depression.  Also discusses healthy ways to reduce stress and to treat/manage anxiety and depression.  Written material given at graduation. Flowsheet Row Cardiac Rehab from 01/17/2021 in [facility] Cardiac and Pulmonary Rehab  Education need identified 12/12/20       Education: Sleep Hygiene -Provides group verbal and written instruction about how sleep can affect your health.  Define sleep hygiene, discuss sleep cycles and impact of sleep habits. Review good sleep hygiene tips.    Initial Review & Psychosocial Screening:  Initial Psych Review & Screening - 12/12/20 0748       Initial Review   Current issues with Current Stress Concerns;Current Psychotropic Meds;Current Sleep Concerns;History of Depression    Source of Stress Concerns Unable to participate in former interests or hobbies;Unable to perform yard/household activities;Family    Comments Knee pain, back pain, shortness of breath are limiting; brother's health (just got LVAD), still lack of sleep, on [place]? Yes   brother, wife  Barriers   Psychosocial barriers to participate in program The patient should benefit from training in stress management and relaxation.;Psychosocial barriers identified (see note)      Screening Interventions   Interventions Encouraged to exercise;To provide support and resources with identified psychosocial needs;Provide feedback about the scores to participant    Expected Outcomes Short Term goal: Utilizing psychosocial counselor, staff and physician to assist with identification of specific Stressors or current issues interfering with healing process. Setting desired goal for each stressor or current issue identified.;Long Term Goal: Stressors or current issues are controlled or eliminated.;Short Term goal: Identification and review with participant of any Quality of Life or Depression concerns found by scoring the questionnaire.;Long Term goal: The participant improves quality of Life and PHQ9 Scores as seen by post scores and/or verbalization of changes             Quality of Life Scores:   Quality of Life - 02/28/21 0937       Quality of Life Scores   Health/Function Pre 16.67 %    Health/Function Post 19.76 %    Health/Function % Change 18.54 %    Socioeconomic Pre 22.13 %    Socioeconomic Post 20.44 %    Socioeconomic % Change  -7.64 %    Psych/Spiritual Pre 23.36 %    Psych/Spiritual Post 23.07 %    Psych/Spiritual % Change -1.24 %    Family Pre 20.2 %    Family Post 16 %    Family % Change -20.79 %    GLOBAL Pre 19.45 %    GLOBAL Post 20.03 %    GLOBAL % Change 2.98 %            Scores of 19 and below usually indicate a poorer quality of life in these areas.  A difference of  2-3 points is a clinically meaningful difference.  A difference of 2-3 points in the total score of the Quality of Life Index has been associated with significant improvement in overall quality of life, self-image, physical  symptoms, and general health in studies assessing change in quality of life.  PHQ-9: Recent Review Flowsheet Data     Depression screen [facility] 2/9 02/28/2021 12/12/2020 08/16/2020 07/12/2020   Decreased Interest 2 2 0 3   Down, Depressed, Hopeless 0 1 0 1   PHQ - 2 Score 2 3 0 4   Altered sleeping _0 Tired, decreased energy _1 Change in appetite 1 0 1 0   Feeling bad or failure about yourself  0 1 0 0   Trouble concentrating 0 1 3 0   Moving slowly or fidgety/restless 1 1 0 1   Suicidal thoughts 0 0 0 0   PHQ-9 Score _2 Difficult doing work/chores Not difficult at all Somewhat difficult Somewhat difficult Somewhat difficult      Interpretation of Total Score  Total Score Depression Severity:  1-4 = Minimal depression, 5-9 = Mild depression, 10-14 = Moderate depression, 15-19 = Moderately severe depression, 20-27 = Severe depression   Psychosocial Evaluation and Intervention:  Psychosocial Evaluation - 12/12/20 0750       Psychosocial Evaluation & Interventions   Interventions Encouraged to exercise with the program and follow exercise prescription;Stress management education;Relaxation education    Comments [name] is returning to rehab after another stent placement.  He has also just switched his blood thinner so he is beginning to feel better.  He has not been having any chest pain, and his breathing is beginning to recover.  He really wants to get back to being able to go and do again without the shortness of breath.  He is on celexa for depression and feeling well managed.  He continues to have issues with sleeping, but that is chronic in nature.  His brother got an LVAD after being in hospital for over a month and recovering. He is still worried about his brother's health.    Expected Outcomes Short; attend cardiac rehab for education and exercise. Long: develop positive self care behaviors.    Continue Psychosocial Services  Follow up required by staff              Psychosocial Re-Evaluation:  Psychosocial Re-Evaluation     Row Name 01/05/21 0926 02/05/21 0936 02/23/21 0927         Psychosocial Re-Evaluation   Current issues with Current Sleep Concerns;Current Stress Concerns Current Sleep Concerns;Current Stress Concerns Current Sleep Concerns     Comments [name] continues to struggle with sleep. This past week, his diverticulitis has been keeping him up at night.  His wife keeps him busy and active. He denies any other major stressor as he tries not to let things get to him as much.  He realizes that he can't do anything about it.  He is hurting, but he cannot get injections due to his blood thinner until July. [name] reports no new stress or sleep concerns. He does not sleep well but this is a chronic issue. He reports still having a good support system and trying to have a positive outlook on life. [name] is doing well in rehab. He continues to deal with his chronic stress issues. He has enjoyed meeting people and getting out and about.     Expected Outcomes Short: Stay acitive for mental boost Long: continue to work on sleep. Short: Stay acitive for mental boost Long: continue to work on sleep. Continue to stay active for mental boost     Interventions Encouraged to attend Cardiac Rehabilitation for the exercise Encouraged to attend Cardiac Rehabilitation for the exercise --     Continue Psychosocial Services  -- Follow up required by staff --              Psychosocial Discharge (Final Psychosocial Re-Evaluation):  Psychosocial Re-Evaluation - 02/23/21 0927       Psychosocial Re-Evaluation   Current issues with Current Sleep Concerns    Comments [name] is doing well in rehab. He continues to deal with his chronic stress issues. He has enjoyed meeting people and getting out and about.    Expected Outcomes Continue to stay active for mental boost             Vocational Rehabilitation: Provide vocational rehab assistance to  qualifying candidates.   Vocational Rehab Evaluation & Intervention:   Education: Education Goals: Education classes will be provided on a variety of topics geared toward better understanding of heart health and risk factor modification. Participant will state understanding/return demonstration of topics presented as noted by education test scores.  Learning Barriers/Preferences:  Learning Barriers/Preferences - 12/12/20 0752       Learning Barriers/Preferences   Learning Barriers None    Learning Preferences None             General Cardiac Education Topics:  AED/CPR: - Group verbal and written instruction with the use of models to demonstrate the basic use of the AED with the  basic ABC's of resuscitation.   Anatomy and Cardiac Procedures: - Group verbal and visual presentation and models provide information about basic cardiac anatomy and function. Reviews the testing methods done to diagnose heart disease and the outcomes of the test results. Describes the treatment choices: Medical Management, Angioplasty, or Coronary Bypass Surgery for treating various heart conditions including Myocardial Infarction, Angina, Valve Disease, and Cardiac Arrhythmias.  Written material given at graduation. Flowsheet Row Cardiac Rehab from 09/13/2020 in [facility] Cardiac and Pulmonary Rehab  Education need identified 07/12/20  Date 08/16/20  Educator SB  Instruction Review Code 1- Verbalizes Understanding       Medication Safety: - Group verbal and visual instruction to review commonly prescribed medications for heart and lung disease. Reviews the medication, class of the drug, and side effects. Includes the steps to properly store meds and maintain the prescription regimen.  Written material given at graduation. Flowsheet Row Cardiac Rehab from 01/17/2021 in [facility] Cardiac and Pulmonary Rehab  Date 01/03/21  Educator SB  Instruction Review Code 1- Verbalizes Understanding       Intimacy: -  Group verbal instruction through game format to discuss how heart and lung disease can affect sexual intimacy. Written material given at graduation.. Flowsheet Row Cardiac Rehab from 09/13/2020 in [facility] Cardiac and Pulmonary Rehab  Date 08/09/20  Educator AS  Instruction Review Code 1- Verbalizes Understanding       Know Your Numbers and Heart Failure: - Group verbal and visual instruction to discuss disease risk factors for cardiac and pulmonary disease and treatment options.  Reviews associated critical values for Overweight/Obesity, Hypertension, Cholesterol, and Diabetes.  Discusses basics of heart failure: signs/symptoms and treatments.  Introduces Heart Failure Zone chart for action plan for heart failure.  Written material given at graduation. Flowsheet Row Cardiac Rehab from 01/17/2021 in [facility] Cardiac and Pulmonary Rehab  Date 01/17/21  Educator KB  Instruction Review Code 1- Verbalizes Understanding       Infection Prevention: - Provides verbal and written material to individual with discussion of infection control including proper hand washing and proper equipment cleaning during exercise session. Flowsheet Row Cardiac Rehab from 01/17/2021 in [facility] Cardiac and Pulmonary Rehab  Date 12/12/20  Educator [facility]  Instruction Review Code 1- Verbalizes Understanding       Falls Prevention: - Provides verbal and written material to individual with discussion of falls prevention and safety. Flowsheet Row Cardiac Rehab from 01/17/2021 in [facility] Cardiac and Pulmonary Rehab  Date 12/12/20  Educator [facility]  Instruction Review Code 1- Verbalizes Understanding       Other: -Provides group and verbal instruction on various topics (see comments)   Knowledge Questionnaire Score:  Knowledge Questionnaire Score - 02/28/21 0937       Knowledge Questionnaire Score   Pre Score 21/26    Post Score 21/26             Core Components/Risk Factors/Patient Goals at Admission:  Personal Goals  and Risk Factors at Admission - 12/12/20 0754       Core Components/Risk Factors/Patient Goals on Admission    Weight Management Yes;Weight Loss    Intervention Weight Management: Develop a combined nutrition and exercise program designed to reach desired caloric intake, while maintaining appropriate intake of nutrient and fiber, sodium and fats, and appropriate energy expenditure required for the weight goal.;Weight Management: Provide education and appropriate resources to help participant work on and attain dietary goals.;Weight Management/Obesity: Establish reasonable short term and long term weight goals.  Admit Weight 208 lb (94.3 kg)    Goal Weight: Short Term 200 lb (90.7 kg)    Goal Weight: Long Term 190 lb (86.2 kg)    Expected Outcomes Short Term: Continue to assess and modify interventions until short term weight is achieved;Long Term: Adherence to nutrition and physical activity/exercise program aimed toward attainment of established weight goal;Weight Loss: Understanding of general recommendations for a balanced deficit meal plan, which promotes 1-2 lb weight loss per week and includes a negative energy balance of [phone] kcal/d;Understanding recommendations for meals to include 15-35% energy as protein, 25-35% energy from fat, 35-60% energy from carbohydrates, less than 258m of dietary cholesterol, 20-35 gm of total fiber daily;Understanding of distribution of calorie intake throughout the day with the consumption of 4-5 meals/snacks    Heart Failure Yes    Intervention Provide a combined exercise and nutrition program that is supplemented with education, support and counseling about heart failure. Directed toward relieving symptoms such as shortness of breath, decreased exercise tolerance, and extremity edema.    Expected Outcomes Improve functional capacity of life;Short term: Attendance in program 2-3 days a week with increased exercise capacity. Reported lower sodium intake.  Reported increased fruit and vegetable intake. Reports medication compliance.;Short term: Daily weights obtained and reported for increase. Utilizing diuretic protocols set by physician.;Long term: Adoption of self-care skills and reduction of barriers for early signs and symptoms recognition and intervention leading to self-care maintenance.    Hypertension Yes    Intervention Provide education on lifestyle modifcations including regular physical activity/exercise, weight management, moderate sodium restriction and increased consumption of fresh fruit, vegetables, and low fat dairy, alcohol moderation, and smoking cessation.;Monitor prescription use compliance.    Expected Outcomes Short Term: Continued assessment and intervention until BP is < 140/969mHG in hypertensive participants. < 130/8020mG in hypertensive participants with diabetes, heart failure or chronic kidney disease.;Long Term: Maintenance of blood pressure at goal levels.    Lipids Yes    Intervention Provide education and support for participant on nutrition & aerobic/resistive exercise along with prescribed medications to achieve LDL <94m42mDL >40mg37m Expected Outcomes Short Term: Participant states understanding of desired cholesterol values and is compliant with medications prescribed. Participant is following exercise prescription and nutrition guidelines.;Long Term: Cholesterol controlled with medications as prescribed, with individualized exercise RX and with personalized nutrition plan. Value goals: LDL < 94mg,60m > 40 mg.             Education:Diabetes - Individual verbal and written instruction to review signs/symptoms of diabetes, desired ranges of glucose level fasting, after meals and with exercise. Acknowledge that pre and post exercise glucose checks will be done for 3 sessions at entry of program.   Core Components/Risk Factors/Patient Goals Review:   Goals and Risk Factor Review     Row Name 01/05/21 0930  02/05/21 0937 16067/22 0931         Core Components/Risk Factors/Patient Goals Review   Personal Goals Review Weight Management/Obesity;Heart Failure;Hypertension;Lipids Weight Management/Obesity;Heart Failure;Hypertension;Lipids Weight Management/Obesity;Heart Failure;Hypertension;Lipids     Review [name] Shanon Browing well in rehab. His weight fluctuates some every day. He weighs daily in the morning and takes his meds.  His pressures have been in the 140s/50s recently.  He does still have some swelling in stomach, legs and feet.  He did take some extra lasix to help and it is better today. Patient reports taking all medications as prescirbed. He weighs daily and has a prodocol set up with  his doctor to take an extra fluid pill if weight is up more then 2 pounds. His blood pressure and lipids are reported to be under control. [name] has done well in rehab. His weight continues to trend down and hopes to keep it that way.  He has not had any heart failure symptoms.  His pressures are doing well and he continues to check them.  He is scheduled for a CT scan for his stomach for his diverticulitis check in.     Expected Outcomes Short: Continue to keep close eye on weight Long: Continue to manage heart failure. Short: continue to attend cardiac consistently and keep an eye on weight. Long: continue to manage heart failure. Continue to montior risk factors and manage heart failure.              Core Components/Risk Factors/Patient Goals at Discharge (Final Review):   Goals and Risk Factor Review - 02/23/21 0931       Core Components/Risk Factors/Patient Goals Review   Personal Goals Review Weight Management/Obesity;Heart Failure;Hypertension;Lipids    Review [name] has done well in rehab. His weight continues to trend down and hopes to keep it that way.  He has not had any heart failure symptoms.  His pressures are doing well and he continues to check them.  He is scheduled for a CT scan for his stomach  for his diverticulitis check in.    Expected Outcomes Continue to montior risk factors and manage heart failure.             ITP Comments:  ITP Comments     Row Name 12/12/20 [phone] 12/13/20 0926 12/27/20 0748 01/24/21 1412 02/21/21 0807   ITP Comments Updated and reviewed medical record for new stent on 11/15/20.  Documentation for diagnosis can be found in [facility] note from 11/15/20.  Completed 6MWT and gym orientation. Initial ITP created and sent for review to Dr. Emily Filbert, Medical Director. First full day of exercise!  Patient was oriented to gym and equipment including functions, settings, policies, and procedures.  Patient's individual exercise prescription and treatment plan were reviewed.  All starting workloads were established based on the results of the 6 minute walk test done at initial orientation visit.  The plan for exercise progression was also introduced and progression will be customized based on patient's performance and goals. 30 Day review completed. Medical Director ITP review done, changes made as directed, and signed approval by Medical Director. 30 day review completed. ITP sent to Dr. Emily Filbert, Medical Director of Cardiac Rehab. Continue with ITP unless changes are made by physician. 30 Day review completed. Medical Director ITP review done, changes made as directed, and signed approval by Medical Director.    [place] Name 03/09/21 0914           ITP Comments Aditya graduated today from  rehab with 36 sessions completed.  Details of the patient's exercise prescription and what He needs to do in order to continue the prescription and progress were discussed with patient.  Patient was given a copy of prescription and goals.  Patient verbalized understanding.  Haydn plans to continue to exercise by walking and using treadmill.                Comments: Discharge ITP

## 2021-03-09 NOTE — Progress Notes (Signed)
Daily Session Note  Patient Details  Name: Willie Clark MRN: [id number] Date of Birth: [birth year] Referring Provider:   Flowsheet Row Cardiac Rehab from 12/12/2020 in [facility] Cardiac [facility] Pulmonary Rehab  Referring Provider End, Harrell Gave MD       Encounter Date: 03/09/2021  Check In:  Session Check In - 03/09/21 0912       Check-In   Supervising physician immediately available to respond to emergencies See telemetry face sheet for immediately available ER MD    Location [facility]-Cardiac & Pulmonary Rehab    Staff Present Renita Papa, RN BSN;Joseph Arcanum, RCP,RRT,BSRT;Jessica Chalmers, [place], RCEP, CCRP, CCET    Virtual Visit No    Medication changes reported     No    Fall or balance concerns reported    No    Warm-up and Cool-down Performed on first and last piece of equipment    Resistance Training Performed Yes    VAD Patient? No    PAD/SET Patient? No      Pain Assessment   Currently in Pain? No/denies                Social History   Tobacco Use  Smoking Status Former   Packs/day: 2.50   Types: Cigarettes   Quit date: 08/21/1991   Years since quitting: 29.5  Smokeless Tobacco Never    Goals Met:  Independence with exercise equipment Exercise tolerated well No report of concerns or symptoms today Strength training completed today  Goals Unmet:  Not Applicable  Comments:  Willie Clark graduated today from  rehab with 36 sessions completed.  Details of the patient's exercise prescription and what He needs to do in order to continue the prescription and progress were discussed with patient.  Patient was given a copy of prescription and goals.  Patient verbalized understanding.  Willie Clark plans to continue to exercise by walking and using treadmill.     Dr. Emily Filbert is Medical Director for [place].  Dr. Ottie Glazier is Medical Director for [facility] Pulmonary Rehabilitation.

## 2021-03-09 NOTE — Progress Notes (Signed)
Discharge Progress Report  Patient Details  Name: Willie Clark MRN: [id number] Date of Birth: 08-Jun-1950 Referring Provider:   Flowsheet Row Cardiac Rehab from 12/12/2020 in [facility] Cardiac and Pulmonary Rehab  Referring Provider End, Harrell Gave MD        Number of Visits: 36  Reason for Discharge:  Patient reached a stable level of exercise. Patient independent in their exercise. Patient has met program and personal goals.  Smoking History:  Social History   Tobacco Use  Smoking Status Former   Packs/day: 2.50   Types: Cigarettes   Quit date: 08/21/1991   Years since quitting: 29.5  Smokeless Tobacco Never    Diagnosis:  Status post coronary artery stent placement  ADL [facility]SD:   Initial Exercise Prescription:  Initial Exercise Prescription - 12/12/20 0800       Date of Initial Exercise RX and Referring Provider   Date 12/12/20    Referring Provider End, Harrell Gave MD      Treadmill   MPH 2.3    Grade 0.5    Minutes 15    METs 2.92      Recumbant Bike   Level 2    RPM 50    Watts 25    Minutes 15    METs 2.5      REL-XR   Level 2    Speed 50    Minutes 15    METs 2.5      T5 Nustep   Level 3    SPM 80    Minutes 15    METs 2.5      Prescription Details   Frequency (times per week) 3    Duration Progress to 30 minutes of continuous aerobic without signs/symptoms of physical distress      Intensity   THRR 40-80% of Max Heartrate 99-134    Ratings of Perceived Exertion 11-13    Perceived Dyspnea 0-4      Progression   Progression Continue to progress workloads to maintain intensity without signs/symptoms of physical distress.      Resistance Training   Training Prescription Yes    Weight 4 lb    Reps 10-15             Discharge Exercise Prescription (Final Exercise Prescription Changes):  Exercise Prescription Changes - 02/26/21 1300       Response to Exercise   Blood Pressure (Admit) 114/62    Blood Pressure (Exit) 118/60     Heart Rate (Admit) 62 bpm    Heart Rate (Exercise) 92 bpm    Heart Rate (Exit) 65 bpm    Rating of Perceived Exertion (Exercise) 13    Symptoms none    Duration Continue with 30 min of aerobic exercise without signs/symptoms of physical distress.    Intensity THRR unchanged      Progression   Progression Continue to progress workloads to maintain intensity without signs/symptoms of physical distress.    Average METs 3.7      Resistance Training   Training Prescription Yes    Weight 4 lb    Reps 10-15      Interval Training   Interval Training No      Biostep-RELP   Level 7    Minutes 15    METs 4      Home Exercise Plan   Plans to continue exercise at Home (comment)   walking, treadmill   Frequency Add 2 additional days to program exercise sessions.    Initial Home  Exercises Provided 12/22/20      Oxygen   Maintain Oxygen Saturation 88% or higher             Functional Capacity:  6 Minute Walk     Row Name 12/12/20 0858 02/26/21 0933       6 Minute Walk   Phase Initial Discharge    Distance 1215 feet 1500 feet    Distance % Change -- 23 %    Distance Feet Change -- 285 ft    Walk Time 6 minutes 6 minutes    # of Rest Breaks 0 0    MPH 2.3 2.84    METS 2.85 3.1    RPE 13 15    Perceived Dyspnea  3 1    VO2 Peak 9.96 10.86    Symptoms Yes (comment) No    Comments SOB, ankle pain 10/10, knee pain 8/10 (Chronic pain) --    Resting HR 65 bpm 60 bpm    Resting BP 144/84 110/70    Resting Oxygen Saturation  97 % 95 %    Exercise Oxygen Saturation  during 6 min walk 98 % 95 %    Max Ex. HR 98 bpm 81 bpm    Max Ex. BP 152/64 142/70    2 Minute Post BP 136/64 --             Psychological, QOL, Others - Outcomes: PHQ 2/9: Depression screen [facility] 2/9 02/28/2021 12/12/2020 08/16/2020 07/12/2020  Decreased Interest 2 2 0 3  Down, Depressed, Hopeless 0 1 0 1  PHQ - 2 Score 2 3 0 4  Altered sleeping 2 3 3 1   Tired, decreased energy 2 3 3 2   Change in  appetite 1 0 1 0  Feeling bad or failure about yourself  0 1 0 0  Trouble concentrating 0 1 3 0  Moving slowly or fidgety/restless 1 1 0 1  Suicidal thoughts 0 0 0 0  PHQ-9 Score 8 12 10 8   Difficult doing work/chores Not difficult at all Somewhat difficult Somewhat difficult Somewhat difficult  Some recent data might be hidden    Quality of Life:  Quality of Life - 02/28/21 0937       Quality of Life Scores   Health/Function Pre 16.67 %    Health/Function Post 19.76 %    Health/Function % Change 18.54 %    Socioeconomic Pre 22.13 %    Socioeconomic Post 20.44 %    Socioeconomic % Change  -7.64 %    Psych/Spiritual Pre 23.36 %    Psych/Spiritual Post 23.07 %    Psych/Spiritual % Change -1.24 %    Family Pre 20.2 %    Family Post 16 %    Family % Change -20.79 %    GLOBAL Pre 19.45 %    GLOBAL Post 20.03 %    GLOBAL % Change 2.98 %             Nutrition & Weight - Outcomes:  Pre Biometrics - 12/12/20 0754       Pre Biometrics   Height 5' 10.1" (1.781 m)    Weight 208 lb (94.3 kg)    BMI (Calculated) 29.74    Single Leg Stand 13.4 seconds             Post Biometrics - 02/26/21 0935        Post  Biometrics   Single Leg Stand 25.53 seconds  Nutrition:  Nutrition Therapy & Goals - 01/05/21 0928       Nutrition Therapy   RD appointment deferred Yes   [name] was just here a few months ago and does not want to meet with RD this time.   Fiber 30 grams    Whole Grain Foods 3 servings    Saturated Fats 12 max. grams    Fruits and Vegetables 8 servings/day    Sodium 1.5 grams      Personal Nutrition Goals   Nutrition Goal Lose weight    Comments Focus on heart health eating and good balance      Intervention Plan   Intervention Prescribe, educate and counsel regarding individualized specific dietary modifications aiming towards targeted core components such as weight, hypertension, lipid management, diabetes, heart failure and other  comorbidities.    Expected Outcomes Short Term Goal: Understand basic principles of dietary content, such as calories, fat, sodium, cholesterol and nutrients.;Short Term Goal: A plan has been developed with personal nutrition goals set during dietitian appointment.;Long Term Goal: Adherence to prescribed nutrition plan.              Education Questionnaire Score:  Knowledge Questionnaire Score - 02/28/21 0937       Knowledge Questionnaire Score   Pre Score 21/26    Post Score 21/26             Goals reviewed with patient; copy given to patient.

## 2021-03-13 ENCOUNTER — Encounter: Payer: Self-pay | Admitting: Cardiovascular Disease

## 2021-03-13 ENCOUNTER — Ambulatory Visit: Payer: Medicare HMO | Admitting: Cardiovascular Disease

## 2021-03-13 ENCOUNTER — Other Ambulatory Visit: Payer: Self-pay

## 2021-03-13 VITALS — BP 110/70 | HR 58 | Ht 68.5 in | Wt 205.1 lb

## 2021-03-13 DIAGNOSIS — I25118 Atherosclerotic heart disease of native coronary artery with other forms of angina pectoris: Secondary | ICD-10-CM

## 2021-03-13 DIAGNOSIS — I5022 Chronic systolic (congestive) heart failure: Secondary | ICD-10-CM

## 2021-03-13 DIAGNOSIS — I10 Essential (primary) hypertension: Secondary | ICD-10-CM

## 2021-03-13 DIAGNOSIS — I714 Abdominal aortic aneurysm, without rupture, unspecified: Secondary | ICD-10-CM

## 2021-03-13 DIAGNOSIS — E785 Hyperlipidemia, unspecified: Secondary | ICD-10-CM | POA: Diagnosis not present

## 2021-03-13 NOTE — Patient Instructions (Signed)
Medication Instructions:  Your physician recommends that you continue on your current medications as directed. Please refer to the Current Medication list given to you today.  *If you need a refill on your cardiac medications before your next appointment, please call your pharmacy*   Lab Work: None ordered  If you have labs (blood work) drawn today and your tests are completely normal, you will receive your results only by: [place] (if you have MyChart) OR A paper copy in the mail If you have any lab test that is abnormal or we need to change your treatment, we will call you to review the results.   Testing/Procedures: None ordered   Follow-Up: At [facility], you and your health needs are our priority.  As part of our continuing mission to provide you with exceptional heart care, we have created designated Provider Care Teams.  These Care Teams include your primary Cardiologist (physician) and Advanced Practice Providers (APPs -  Physician Assistants and Nurse Practitioners) who all work together to provide you with the care you need, when you need it.  We recommend signing up for the patient portal called "MyChart".  Sign up information is provided on this After Visit Summary.  MyChart is used to connect with patients for Virtual Visits (Telemedicine).  Patients are able to view lab/test results, encounter notes, upcoming appointments, etc.  Non-urgent messages can be sent to your provider as well.   To learn more about what you can do with MyChart, go to [email or web address].    Your next appointment:   6 month(s)  The format for your next appointment:   In Person  Provider:   You may see [name], MD or one of the following Advanced Practice Providers on your designated Care Team:   Murray Hodgkins, NP Christell Faith, PA-C Cadence Kathlen Mody, [place]     Other Instructions Medication Samples have been provided to the patient.  Drug name: Jardiance         Strength: 10 mg          Qty: 2 boxes   LOT: [id number]   Exp.Date: 08/2022  Dosing instructions: 1 tablet daily

## 2021-03-13 NOTE — Progress Notes (Signed)
Cardiology Office Note   Date:  03/13/2021   ID:  Willie Clark, DOB 1951/03/25, MRN [id number]  PCP:  [facility] [place]  Cardiologist:   [name], MD   Chief Complaint  Patient presents with   Other    3 Month f/u c/o sob and giving out with exertion. Meds reviewed verbally with pt.      History of Present Illness: Willie Clark is a 70 y.o. male who presents for regarding coronary artery disease and chronic systolic heart failure.  He has known history of coronary artery disease. He is status post coronary artery bypass graft surgery in 1993 and redo in 2007.   He suffers from fibromyalgia and chronic pain.  He had GI symptoms in 2015 due to documented gastric ulcer. He had neck surgery in 6568 without complications. He has known history of chronic neck pain with 2 previous cervical spine surgeries.    He was hospitalized in August of 2021with chest pain.  Echocardiogram showed an EF of 40 to 45%.  Cardiac catheterization showed severe underlying three-vessel coronary arteries with patent grafts and no significant change since 2017.  The patient antihypertensive medications were switched to losartan and carvedilol.  Rosuvastatin was increased to 40 mg daily. Losartan was subsequently switched to [facility].  However, the patient had allergic reaction with rash and itching that had to be treated in the emergency room.  Spironolactone was subsequently added.  He was seen on March in the office with symptoms of unstable angina.  His EKG was worrisome for inferior ST elevation myocardial infarction and thus he was transferred emergently to the Cath Lab where he underwent cardiac catheterization which showed severe native three-vessel coronary artery disease with patent left circumflex stent with mild to moderate in-stent restenosis, widely patent LIMA to LAD, patent SVG to OM 2 with 60 to 70% proximal graft stenosis and subtotally occluded SVG to RCA with long  segment of proximal graft disease and 99% stenosis at the distal anastomosis.  He underwent successful PCI to SVG to RCA with overlapping resolute Onyx drug-eluting stents. Echocardiogram during that admission showed an EF of 35 to 40%.    He was hospitalized in July with increased shortness of breath and chest pain.   Cardiac catheterization was performed which showed significant underlying three-vessel coronary artery disease with patent left circumflex stent with moderate in-stent restenosis, patent LIMA to LAD, patent SVG to right PDA with patent stents and patent SVG to OM with sequential 75% and 60% ostial and mid graft stenoses.  Right heart catheterization showed mildly elevated filling pressures with mildly reduced cardiac output.  PCI with 2 nonoverlapping drug-eluting stent placement was done to SVG to OM.  He continued to have shortness of breath after the procedure.  Thus, during last visit, I switched him from ticagrelor to clopidogrel.  [name] was added and was subsequently switched to [facility] for better coverage. He finished cardiac rehab.  He reports stable episodes of chest pain.  He continues to have exertional dyspnea.  His wife was recently diagnosed with COVID.   Past Medical History:  Diagnosis Date   Anxiety    Arthritis    Barrett's esophagus    Bowel obstruction ([place]) 02/2009   small bowel   CHF (congestive heart failure) (HCC)    Chronic chest pain    Coronary artery disease    a. 1993 CABG; b. 2007 Redo CABG; c. 09/2015 Cath: LM mild dzs, LAD 100ost, RI mild  dzs, LCX 60p, OM1 100, RCA 100ost, VG->OM2 min irregs, LIMA->LAD nl, VG->RPDA 30ost, 20d, nl EF->Med rx.   Depression    Diverticulitis    Diverticulosis    Erectile dysfunction    Fibromyalgia    Gastric ulcer    Gastritis    GERD (gastroesophageal reflux disease)    Headache    Hemorrhoids    HFrEF (heart failure with reduced ejection fraction) ([place])    a. 09/2016 Echo: EF 55-60%, no rwma, triv AI,  mild MR, mildly dil LA. -> as of April 2020: EF 40 to 45%.  Septal dyssynergy/hypokinesis due to postop state but otherwise unable to assess wall motion [name] due to poor study.   History of myocardial infarct at age less than 75 years    Hyperlipidemia    Hypertension    Kidney stones    going to [facility]  June 26 to see kidney specialist   Lightheadedness    Mass on back    Nephrolithiasis    Orthostatic hypotension    a. Improved after discontinuation of metoprolol.   OSA (obstructive sleep apnea)    a. On CPAP.   Pulmonary embolism ([place]) 01/2006   Post-op/treated   Skin lesions, generalized    Stroke [facility]) 1993   right side weakness no blood thinners   on Aspirin   Syncope and collapse    Urticaria     Past Surgical History:  Procedure Laterality Date   BACK SURGERY     bowel obstruction  01/2009   CARDIAC CATHETERIZATION  11/2006   patent grafts. Significant OM3 disease and occluded diagonals.   CARDIAC CATHETERIZATION  06/2010   patent grafts. significant ISR in proximal LCX but OM2 is bypassed and gives retrograde flow to OM3.    CARDIAC CATHETERIZATION  06/2013   [facility]: Patent grafts with 60% proximal in-stent restenosis in the left circumflex with FFR of 0.85   CARDIAC CATHETERIZATION Left 09/25/2015   Procedure: Left Heart Cath and Cors/Grafts Angiography;  Surgeon: Wellington Hampshire, MD;  Location: [place] CV LAB;  Service: Cardiovascular;  Laterality: Left;   CERVICAL FUSION     CHOLECYSTECTOMY     COLON SURGERY  01/2009   BOWEL RESECTION DUE TO SMALL BOWEL OBSTRUCTION   COLONOSCOPY     COLONOSCOPY WITH PROPOFOL N/A 10/28/2017   Procedure: COLONOSCOPY WITH PROPOFOL;  Surgeon: Lollie Sails, MD;  Location: [facility] ENDOSCOPY;  Service: Endoscopy;  Laterality: N/A;   CORONARY ANGIOPLASTY  2006   CORONARY ARTERY BYPASS GRAFT  1993/01/2006   redo at [facility]. LIMA to LAD, SVG to OM2 and SVG to RPDA   CORONARY STENT INTERVENTION N/A 06/20/2020   Procedure: CORONARY  STENT INTERVENTION;  Surgeon: Nelva Bush, MD;  Location: [place] CV LAB;  Service: Cardiovascular;  Laterality: N/A;   CORONARY STENT INTERVENTION N/A 11/15/2020   Procedure: CORONARY STENT INTERVENTION;  Surgeon: Nelva Bush, MD;  Location: [place] [facility] LAB;  Service: Cardiovascular;  Laterality: N/A;   CYSTOSCOPY WITH STENT PLACEMENT Bilateral 09/24/2015   Procedure: CYSTOSCOPY WITH BILATERAL RETROGRADES, BILATERAL STENT PLACEMENT;  Surgeon: Festus Aloe, MD;  Location: [facility] ORS;  Service: Urology;  Laterality: Bilateral;   ESOPHAGOGASTRODUODENOSCOPY N/A 10/25/2014   Procedure: ESOPHAGOGASTRODUODENOSCOPY (EGD);  Surgeon: Lollie Sails, MD;  Location: [facility] ENDOSCOPY;  Service: Endoscopy;  Laterality: N/A;   ESOPHAGOGASTRODUODENOSCOPY (EGD) WITH PROPOFOL N/A 02/03/2017   Procedure: ESOPHAGOGASTRODUODENOSCOPY (EGD) WITH PROPOFOL;  Surgeon: Lollie Sails, MD;  Location: [facility] ENDOSCOPY;  Service: Endoscopy;  Laterality: N/A;  ESOPHAGOGASTRODUODENOSCOPY (EGD) WITH PROPOFOL N/A 04/18/2017   Procedure: ESOPHAGOGASTRODUODENOSCOPY (EGD) WITH PROPOFOL;  Surgeon: Lollie Sails, MD;  Location: [facility] ENDOSCOPY;  Service: Endoscopy;  Laterality: N/A;   EYE SURGERY Bilateral    Cataract Extraction with IOL   HERNIA REPAIR     INGUINAL HERNIA REPAIR Bilateral 01/23/2016   Procedure: HERNIA REPAIR INGUINAL ADULT BILATERAL;  Surgeon: Leonie Green, MD;  Location: [facility] ORS;  Service: General;  Laterality: Bilateral;   LEFT HEART CATH AND CORS/GRAFTS ANGIOGRAPHY N/A 12/13/2019   Procedure: LEFT HEART CATH AND CORS/GRAFTS ANGIOGRAPHY;  Surgeon: Wellington Hampshire, MD;  Location: [place] [facility] LAB;  Service: Cardiovascular;  Laterality: N/A;   LEFT HEART CATH AND CORS/GRAFTS ANGIOGRAPHY N/A 06/20/2020   Procedure: LEFT HEART CATH AND CORS/GRAFTS ANGIOGRAPHY;  Surgeon: Nelva Bush, MD;  Location: [place] CV LAB;  Service: Cardiovascular;  Laterality: N/A;   LUMBAR  LAMINECTOMY/DECOMPRESSION MICRODISCECTOMY Left 10/01/2016   Procedure: Left Lumbar four-five Laminotomy for resection of synovial cyst;  Surgeon: Erline Levine, MD;  Location: [place];  Service: Neurosurgery;  Laterality: Left;  left   NECK SURGERY  06/2009   RIGHT/LEFT HEART CATH AND CORONARY/GRAFT ANGIOGRAPHY N/A 11/15/2020   Procedure: RIGHT/LEFT HEART CATH AND CORONARY/GRAFT ANGIOGRAPHY;  Surgeon: Nelva Bush, MD;  Location: [place] [facility] LAB;  Service: Cardiovascular;  Laterality: N/A;   SHOULDER SURGERY  2010     Current Outpatient Medications  Medication Sig Dispense Refill   albuterol (PROVENTIL HFA;VENTOLIN HFA) 108 (90 Base) MCG/ACT inhaler Inhale into the lungs every 4 (four) hours as needed for wheezing or shortness of breath.     aspirin EC 81 MG EC tablet Take 1 tablet (81 mg total) by mouth daily. Swallow whole. 30 tablet 11   carvedilol (COREG) 3.125 MG tablet TAKE 1 TABLET (3.125 MG TOTAL) BY MOUTH 2 (TWO) TIMES DAILY WITH A MEAL. 180 tablet 0   citalopram (CELEXA) 20 MG tablet Take 20 mg by mouth daily.      clopidogrel (PLAVIX) 75 MG tablet TAKE (4 TABLETS) 300 MG STARTING FRI 12/08/20, THEN STARTING THE NEXT DAY TAKE (1) TABLET 75 MG DAILY. 90 tablet 1   docusate calcium (SURFAK) 240 MG capsule Take 240 mg by mouth daily.     empagliflozin (JARDIANCE) 10 MG TABS tablet Take 1 tablet (10 mg total) by mouth daily before breakfast. 30 tablet 5   EPINEPHrine 0.3 mg/0.3 mL IJ SOAJ injection Inject 0.3 mg into the muscle as needed.      fexofenadine (ALLEGRA) 180 MG tablet Take 180 mg by mouth daily.     finasteride (PROSCAR) 5 MG tablet Take 5 mg by mouth daily.     gabapentin (NEURONTIN) 800 MG tablet Take 1 tablet by mouth 3 (three) times daily.     isosorbide mononitrate (IMDUR) 60 MG 24 hr tablet Take 1 tablet (60 mg total) by mouth daily. 90 tablet 3   KLOR-CON M20 20 MEQ tablet TAKE 1 TABLET BY MOUTH EVERY DAY 90 tablet 0   losartan (COZAAR) 25 MG tablet TAKE 1 TABLET  EVERY DAY 90 tablet 0   mesalamine (APRISO) 0.375 g 24 hr capsule Take 1.4 mg by mouth daily.     Multiple Vitamins-Minerals (HAIR SKIN AND NAILS FORMULA PO) Take 1 capsule by mouth daily.     NALTREXONE HCL PO Take 2.5 mg by mouth daily.     nitroGLYCERIN (NITROSTAT) 0.4 MG SL tablet Place 1 tablet (0.4 mg total) under the tongue every 5 (five) minutes as  needed for chest pain. 90 tablet 1   ondansetron (ZOFRAN) 4 MG tablet Take 4 mg by mouth every 8 (eight) hours as needed for nausea or vomiting.     oxyCODONE (OXY IR/ROXICODONE) 5 MG immediate release tablet 0.5-1 mg every 6 (six) hours as needed.     pantoprazole (PROTONIX) 40 MG tablet Take 40 mg by mouth 2 (two) times daily.      Peppermint Oil (IBGARD PO) Take by mouth daily at 2 am.     Probiotic Product (PROBIOTIC PO) Take by mouth daily at 8 pm.     rOPINIRole (REQUIP) 1 MG tablet Take 1 mg by mouth 2 (two) times daily.     rosuvastatin (CRESTOR) 40 MG tablet TAKE 1 TABLET BY MOUTH EVERY DAY 90 tablet 3   spironolactone (ALDACTONE) 25 MG tablet TAKE 1 TABLET EVERY DAY 90 tablet 1   torsemide (DEMADEX) 20 MG tablet Take 2 tablets (40 mg total) by mouth daily. 180 tablet 0   traMADol (ULTRAM) 50 MG tablet Take 50 mg by mouth every 6 (six) hours as needed.     VASCEPA 1 g capsule TAKE 2 CAPSULES TWICE DAILY 360 capsule 1   vitamin B-12 (CYANOCOBALAMIN) 1000 MCG tablet Take 1,000 mcg by mouth daily.     No current facility-administered medications for this visit.    Allergies:   Amitriptyline, Cyclobenzaprine, Dicyclomine, Sacubitril-valsartan, Amitriptyline hcl, Bacon flavor, Hydrocodone, Fish oil, Lidocaine, Omega-3 fatty acids-vitamin e, Other, Oxycodone-acetaminophen, and Sulfa antibiotics    Social History:  The patient  reports that he quit smoking about 29 years ago. His smoking use included cigarettes. He smoked an average of 2.5 packs per day. He has never used smokeless tobacco. He reports that he does not currently use  alcohol. He reports that he does not use drugs.   Family History:  The patient's family history includes Heart attack in his father; Heart disease (age of onset: 52) in his father.    ROS:  Please see the history of present illness.   Otherwise, review of systems are positive for none.   All other systems are reviewed and negative.    PHYSICAL EXAM: VS:  BP 110/70 (BP Location: Left Arm, Patient Position: Sitting, Cuff Size: Normal)   Pulse (!) 58   Ht 5' 8.5" (1.74 m)   Wt 205 lb 2 oz (93 kg)   SpO2 98%   BMI 30.74 kg/m  , BMI Body mass index is 30.74 kg/m. GEN: Well nourished, well developed, in no acute distress  HEENT: normal  Neck: no JVD, carotid bruits, or masses Cardiac: RRR; no murmurs, rubs, or gallops,no edema  Respiratory:  clear to auscultation bilaterally, normal work of breathing GI: soft, nontender, nondistended, + BS MS: no deformity or atrophy  Skin: warm and dry, no rash Neuro:  Strength and sensation are intact Psych: euthymic mood, full affect No radial hematoma  EKG:  EKG is not ordered today.   Recent Labs: 08/07/2020: NT-Pro BNP 242 11/14/2020: ALT 23; B Natriuretic Peptide 20.8; TSH 2.375 11/16/2020: Hemoglobin 13.3; Platelets 164 11/30/2020: BUN 14; Potassium 3.7; Sodium 136 03/06/2021: Creatinine, Ser 1.20    Lipid Panel    Component Value Date/Time   CHOL 165 02/07/2020 0658   TRIG 317 (H) 02/07/2020 0658   HDL 41 02/07/2020 0658   CHOLHDL 4.0 02/07/2020 0658   VLDL 63 (H) 02/07/2020 0658   LDLCALC 61 02/07/2020 0658      Wt Readings from Last 3 Encounters:  03/13/21 205  lb 2 oz (93 kg)  01/29/21 207 lb (93.9 kg)  12/12/20 208 lb (94.3 kg)         ASSESSMENT AND PLAN:  1.  Coronary artery disease involving native coronary arteries with other forms of angina: Stable episodes of chest pain overall.  Shortness of breath initially improved after switching ticagrelor to clopidogrel but he reports recurrent shortness of breath but  symptoms are overall stable.    2.  Chronic systolic heart failure: Most recent echocardiogram showed an EF of 40 to 45%.  Filling pressures were only mildly elevated and currently appears to be euvolemic on current dose of torsemide 40 mg once daily.   His blood pressure is on the low side and does not allow up titration of medications. Continue small dose carvedilol, losartan, spironolactone and Jardiance.  He is currently in the "donut hole" and we provided him with samples of Jardiance.  3. Essential hypertension: Blood pressure is controlled.   4. Hyperlipidemia: Continue high-dose rosuvastatin and Vascepa.  Most recent lipid profile showed an LDL of 61 and triglyceride of 317.   5. Mild carotid disease: Previous carotid Doppler showed less than 40% stenosis bilaterally. No need to repeat.  6.  Small abdominal aortic aneurysm: This was noted on ultrasound yesterday measuring 3 cm.  Recommend repeat ultrasound in 2 years .    Disposition: Follow-up in 6 months.   Signed,  [name], MD  03/13/2021 2:41 PM    [place]

## 2021-03-14 ENCOUNTER — Ambulatory Visit: Payer: Medicare HMO | Admitting: Urology

## 2021-03-14 ENCOUNTER — Encounter: Payer: Self-pay | Admitting: Urology

## 2021-03-14 VITALS — BP 112/61 | HR 59 | Ht 68.5 in | Wt 201.0 lb

## 2021-03-14 DIAGNOSIS — N476 Balanoposthitis: Secondary | ICD-10-CM

## 2021-03-14 DIAGNOSIS — N4889 Other specified disorders of penis: Secondary | ICD-10-CM | POA: Diagnosis not present

## 2021-03-14 LAB — URINALYSIS, COMPLETE
Bilirubin, UA: NEGATIVE
Ketones, UA: NEGATIVE
Leukocytes,UA: NEGATIVE
Nitrite, UA: NEGATIVE
Protein,UA: NEGATIVE
RBC, UA: NEGATIVE
Specific Gravity, UA: 1.01 (ref 1.005–1.030)
Urobilinogen, Ur: 0.2 mg/dL (ref 0.2–1.0)
pH, UA: 5.5 (ref 5.0–7.5)

## 2021-03-14 LAB — MICROSCOPIC EXAMINATION: Bacteria, UA: NONE SEEN

## 2021-03-14 MED ORDER — NYSTATIN-TRIAMCINOLONE 100000-0.1 UNIT/GM-% EX OINT: 1.0000 "application " | TOPICAL_OINTMENT | Freq: Two times a day (BID) | CUTANEOUS | 0 refills | Status: DC

## 2021-03-14 NOTE — Patient Instructions (Signed)
Balanitis ?Balanitis is swelling and irritation of the head of the penis (glans penis). Balanitis occurs most often among males who have not had their foreskin removed (uncircumcised). In uncircumcised males, the condition may also cause inflammation of the skin around the foreskin. ?Balanitis sometimes causes scarring of the penis or foreskin, which can require surgery. This condition may develop because of an infection or another medical condition. Untreated balanitis can increase the risk of penile cancer. ?What are the causes? ?Common causes of this condition include: ?Irritation and lack of airflow due to fluid (smegma) that can build up on the glans penis. ?Poor personal hygiene, especially in uncircumcised males. Not cleaning the glans penis and foreskin well can result in a buildup of bacteria, viruses, and yeast, which can lead to infection and inflammation. ?Other causes include: ?Chemical irritation from products such as soaps or shower gels, especially those that have fragrance. Chemical irritation can also be caused by condoms, personal lubricants, petroleum jelly, spermicides, fabric softeners, or laundry detergents. ?Skin conditions, such as eczema, dermatitis, and psoriasis. ?Allergies to medicines, such as tetracycline and sulfa drugs. ?What increases the risk? ?The following factors may make you more likely to develop this condition: ?Being an uncircumcised male. ?Having diabetes. ?Having other medical conditions, including liver cirrhosis, congestive heart failure, or kidney disease. ?Having infections, such as candidiasis, HPV (human papillomavirus), herpes simplex, gonorrhea, or syphilis. ?Having a tight foreskin that is difficult to pull back (retract) past the glans penis. ?Being severely obese. ?History of reactive arthritis. ?What are the signs or symptoms? ?Symptoms of this condition include: ?Discharge from under the foreskin, and pain or difficulty retracting the foreskin. ?A bad smell or  itchiness on the penis. ?Tenderness, redness, and swelling of the glans penis. ?A rash or sores on the glans penis or foreskin. ?Inability to get an erection due to pain. ?Trouble urinating. ?Scarring of the penis or foreskin, in some cases. ?How is this diagnosed? ?This condition may be diagnosed based on a physical exam and tests of a swab of discharge to check for bacterial or fungal infection. ?You may also have blood tests to check for: ?Viruses that can cause balanitis. ?A high blood sugar (glucose) level. This could be a sign of diabetes, which can increase the risk of balanitis. ?How is this treated? ?Treatment for this condition depends on the cause. Treatment may include: ?Improving personal hygiene. Your health care provider may recommend sitting in a bath of warm water that is deep enough to cover your hips and buttocks (sitz bath). ?Medicines such as: ?Creams or ointments to reduce swelling (steroids) or to treat an infection. ?Antibiotic medicine. ?Antifungal medicine. ?Having surgery to remove or cut the foreskin (circumcision). This may be done if you have scarring on the foreskin that makes it difficult to retract. ?Controlling other medical problems that may be causing your condition or making it worse. ?Follow these instructions at home: ?Medicines ?Take over-the-counter and prescription medicines only as told by your health care provider. ?If you were prescribed an antibiotic medicine, use it as told by your health care provider. Do not stop using the antibiotic even if you start to feel better. ?General instructions ?Do not have sex until the condition clears up, or until your health care provider approves. ?Keep your penis clean and dry. Take sitz baths as recommended by your health care provider. ?Avoid products that irritate your skin or make symptoms worse, such as soaps and shower gels that have fragrance. ?Keep all follow-up visits. This is important. ?  Contact a health care provider  if: ?Your symptoms get worse or do not improve with home care. ?You develop chills or a fever. ?You have trouble urinating. ?You cannot retract your foreskin. ?Get help right away if: ?You develop severe pain. ?You are unable to urinate. ?Summary ?Balanitis is swelling and irritation of the head of the penis (glans penis). This condition is most common among uncircumcised males. ?Balanitis causes pain, redness, and swelling of the glans penis. ?Good personal hygiene is important. ?Treatment may include improving personal hygiene and applying creams or ointments. ?Contact a health care provider if your symptoms get worse or do not improve with home care. ?This information is not intended to replace advice given to you by your health care provider. Make sure you discuss any questions you have with your health care provider. ?Document Revised: 09/20/2020 Document Reviewed: 09/20/2020 ?Elsevier Patient Education ? 2022 [employer] ? ?

## 2021-03-14 NOTE — Progress Notes (Signed)
03/14/21 10:43 AM   Willie Clark 11-21-1950 [id number]  CC: " Penile bleeding"  HPI: 70 year old extremely comorbid male who reports about a month of irritative itching and pain at the glans of the penis and foreskin, as well as some bleeding from the foreskin.  He is not having any significant dysuria or urinary symptoms.  He denies any gross hematuria.  Urinalysis today is completely benign.  He recently had a CT abdomen pelvis with contrast dated 03/06/2021 that was also benign with no urologic abnormalities.   PMH: Past Medical History:  Diagnosis Date   Anxiety    Arthritis    Barrett's esophagus    Bowel obstruction ([place]) 02/2009   small bowel   CHF (congestive heart failure) (HCC)    Chronic chest pain    Coronary artery disease    a. 1993 CABG; b. 2007 Redo CABG; c. 09/2015 Cath: LM mild dzs, LAD 100ost, RI mild dzs, LCX 60p, OM1 100, RCA 100ost, VG->OM2 min irregs, LIMA->LAD nl, VG->RPDA 30ost, 20d, nl EF->Med rx.   Depression    Diverticulitis    Diverticulosis    Erectile dysfunction    Fibromyalgia    Gastric ulcer    Gastritis    GERD (gastroesophageal reflux disease)    Headache    Hemorrhoids    HFrEF (heart failure with reduced ejection fraction) ([place])    a. 09/2016 Echo: EF 55-60%, no rwma, triv AI, mild MR, mildly dil LA. -> as of April 2020: EF 40 to 45%.  Septal dyssynergy/hypokinesis due to postop state but otherwise unable to assess wall motion [name] due to poor study.   History of myocardial infarct at age less than 43 years    Hyperlipidemia    Hypertension    Kidney stones    going to [facility]  June 26 to see kidney specialist   Lightheadedness    Mass on back    Nephrolithiasis    Orthostatic hypotension    a. Improved after discontinuation of metoprolol.   OSA (obstructive sleep apnea)    a. On CPAP.   Pulmonary embolism ([place]) 01/2006   Post-op/treated   Skin lesions, generalized    Stroke [facility]) 1993   right side weakness no blood  thinners   on Aspirin   Syncope and collapse    Urticaria     Surgical History: Past Surgical History:  Procedure Laterality Date   BACK SURGERY     bowel obstruction  01/2009   CARDIAC CATHETERIZATION  11/2006   patent grafts. Significant OM3 disease and occluded diagonals.   CARDIAC CATHETERIZATION  06/2010   patent grafts. significant ISR in proximal LCX but OM2 is bypassed and gives retrograde flow to OM3.    CARDIAC CATHETERIZATION  06/2013   [facility]: Patent grafts with 60% proximal in-stent restenosis in the left circumflex with FFR of 0.85   CARDIAC CATHETERIZATION Left 09/25/2015   Procedure: Left Heart Cath and Cors/Grafts Angiography;  Surgeon: Wellington Hampshire, MD;  Location: [place] [facility];  Service: Cardiovascular;  Laterality: Left;   CERVICAL FUSION     CHOLECYSTECTOMY     COLON SURGERY  01/2009   BOWEL RESECTION DUE TO SMALL BOWEL OBSTRUCTION   COLONOSCOPY     COLONOSCOPY WITH PROPOFOL N/A 10/28/2017   Procedure: COLONOSCOPY WITH PROPOFOL;  Surgeon: Lollie Sails, MD;  Location: [facility] ENDOSCOPY;  Service: Endoscopy;  Laterality: N/A;   CORONARY ANGIOPLASTY  2006   CORONARY ARTERY BYPASS GRAFT  1993/01/2006  redo at [facility]. LIMA to LAD, SVG to OM2 and SVG to RPDA   CORONARY STENT INTERVENTION N/A 06/20/2020   Procedure: CORONARY STENT INTERVENTION;  Surgeon: Nelva Bush, MD;  Location: [place] [facility];  Service: Cardiovascular;  Laterality: N/A;   CORONARY STENT INTERVENTION N/A 11/15/2020   Procedure: CORONARY STENT INTERVENTION;  Surgeon: Nelva Bush, MD;  Location: [place] [facility];  Service: Cardiovascular;  Laterality: N/A;   CYSTOSCOPY WITH STENT PLACEMENT Bilateral 09/24/2015   Procedure: CYSTOSCOPY WITH BILATERAL RETROGRADES, BILATERAL STENT PLACEMENT;  Surgeon: Festus Aloe, MD;  Location: [facility] ORS;  Service: Urology;  Laterality: Bilateral;   ESOPHAGOGASTRODUODENOSCOPY N/A 10/25/2014   Procedure: ESOPHAGOGASTRODUODENOSCOPY (EGD);  Surgeon:  Lollie Sails, MD;  Location: [facility] ENDOSCOPY;  Service: Endoscopy;  Laterality: N/A;   ESOPHAGOGASTRODUODENOSCOPY (EGD) WITH PROPOFOL N/A 02/03/2017   Procedure: ESOPHAGOGASTRODUODENOSCOPY (EGD) WITH PROPOFOL;  Surgeon: Lollie Sails, MD;  Location: [facility] ENDOSCOPY;  Service: Endoscopy;  Laterality: N/A;   ESOPHAGOGASTRODUODENOSCOPY (EGD) WITH PROPOFOL N/A 04/18/2017   Procedure: ESOPHAGOGASTRODUODENOSCOPY (EGD) WITH PROPOFOL;  Surgeon: Lollie Sails, MD;  Location: [facility] ENDOSCOPY;  Service: Endoscopy;  Laterality: N/A;   EYE SURGERY Bilateral    Cataract Extraction with IOL   HERNIA REPAIR     INGUINAL HERNIA REPAIR Bilateral 01/23/2016   Procedure: HERNIA REPAIR INGUINAL ADULT BILATERAL;  Surgeon: Leonie Green, MD;  Location: [facility] ORS;  Service: General;  Laterality: Bilateral;   LEFT HEART CATH AND CORS/GRAFTS ANGIOGRAPHY N/A 12/13/2019   Procedure: LEFT HEART CATH AND CORS/GRAFTS ANGIOGRAPHY;  Surgeon: Wellington Hampshire, MD;  Location: [place] [facility];  Service: Cardiovascular;  Laterality: N/A;   LEFT HEART CATH AND CORS/GRAFTS ANGIOGRAPHY N/A 06/20/2020   Procedure: LEFT HEART CATH AND CORS/GRAFTS ANGIOGRAPHY;  Surgeon: Nelva Bush, MD;  Location: [place] [facility];  Service: Cardiovascular;  Laterality: N/A;   LUMBAR LAMINECTOMY/DECOMPRESSION MICRODISCECTOMY Left 10/01/2016   Procedure: Left Lumbar four-five Laminotomy for resection of synovial cyst;  Surgeon: Erline Levine, MD;  Location: [place];  Service: Neurosurgery;  Laterality: Left;  left   NECK SURGERY  06/2009   RIGHT/LEFT HEART CATH AND CORONARY/GRAFT ANGIOGRAPHY N/A 11/15/2020   Procedure: RIGHT/LEFT HEART CATH AND CORONARY/GRAFT ANGIOGRAPHY;  Surgeon: Nelva Bush, MD;  Location: [place] [facility];  Service: Cardiovascular;  Laterality: N/A;   SHOULDER SURGERY  2010   Family History: Family History  Problem Relation Age of Onset   Heart disease Father 59   Heart attack Father     Social  History:  reports that he quit smoking about 29 years ago. His smoking use included cigarettes. He smoked an average of 2.5 packs per day. He has never used smokeless tobacco. He reports that he does not currently use alcohol. He reports that he does not use drugs.  Physical Exam: BP 112/61 (BP Location: Left Arm, Patient Position: Sitting, Cuff Size: Normal)   Pulse (!) 59   Ht 5' 8.5" (1.74 m)   Wt 201 lb (91.2 kg)   BMI 30.12 kg/m    Constitutional:  Alert and oriented, No acute distress. Cardiovascular: No clubbing, cyanosis, or edema. Respiratory: Normal respiratory effort, no increased work of breathing. GI: Abdomen is soft, nontender, nondistended, no abdominal masses GU: Uncircumcised phallus with mild balanitis, no suspicious lesions  Pertinent Imaging: I have personally viewed and interpreted the CT dated 03/06/2021 that shows no significant urologic abnormalities, small prostate.  Assessment & Plan:   70 year old male with likely balanitis.  We reviewed behavioral strategies, and I  recommended a trial of Mycolog cream twice daily.  Return precautions discussed, and recommended follow-up in 6 weeks for symptom check and to confirm resolution of symptoms.   Nickolas Madrid, MD 03/14/2021  [facility] [address] [place], [place] [address] [phone]

## 2021-03-16 ENCOUNTER — Other Ambulatory Visit: Payer: Self-pay | Admitting: Cardiovascular Disease

## 2021-03-16 DIAGNOSIS — I5042 Chronic combined systolic (congestive) and diastolic (congestive) heart failure: Secondary | ICD-10-CM

## 2021-03-18 ENCOUNTER — Other Ambulatory Visit: Payer: Self-pay | Admitting: Cardiovascular Disease

## 2021-03-26 ENCOUNTER — Other Ambulatory Visit: Payer: Self-pay | Admitting: Primary Care

## 2021-03-26 DIAGNOSIS — N644 Mastodynia: Secondary | ICD-10-CM

## 2021-03-28 ENCOUNTER — Other Ambulatory Visit: Payer: Self-pay | Admitting: Primary Care

## 2021-03-28 DIAGNOSIS — N644 Mastodynia: Secondary | ICD-10-CM

## 2021-04-02 ENCOUNTER — Ambulatory Visit
Admission: RE | Admit: 2021-04-02 | Discharge: 2021-04-02 | Disposition: A | Discharge status: Home / Self Care | Payer: Medicare HMO | Source: Ambulatory Visit | Attending: Primary Care | Admitting: Primary Care

## 2021-04-02 ENCOUNTER — Other Ambulatory Visit: Payer: Self-pay

## 2021-04-02 DIAGNOSIS — N644 Mastodynia: Secondary | ICD-10-CM

## 2021-04-11 ENCOUNTER — Other Ambulatory Visit: Payer: Self-pay

## 2021-04-11 MED ORDER — CLOPIDOGREL BISULFATE 75 MG PO TABS: ORAL_TABLET | ORAL | 1 refills | Status: DC

## 2021-04-11 MED ORDER — TORSEMIDE 20 MG PO TABS: 40.0000 mg | ORAL_TABLET | Freq: Every day | ORAL | 1 refills | Status: DC

## 2021-04-11 MED ORDER — EMPAGLIFLOZIN 10 MG PO TABS: 10.0000 mg | ORAL_TABLET | Freq: Every day | ORAL | 1 refills | Status: DC

## 2021-04-12 ENCOUNTER — Other Ambulatory Visit: Payer: Self-pay

## 2021-04-12 MED ORDER — EMPAGLIFLOZIN 10 MG PO TABS: 10.0000 mg | ORAL_TABLET | Freq: Every day | ORAL | 3 refills | Status: DC

## 2021-04-12 MED ORDER — CLOPIDOGREL BISULFATE 75 MG PO TABS: ORAL_TABLET | ORAL | 3 refills | Status: DC

## 2021-04-12 MED ORDER — TORSEMIDE 20 MG PO TABS: 40.0000 mg | ORAL_TABLET | Freq: Every day | ORAL | 2 refills | Status: DC

## 2021-04-25 ENCOUNTER — Encounter: Payer: Self-pay | Admitting: Urology

## 2021-04-25 ENCOUNTER — Other Ambulatory Visit: Payer: Self-pay

## 2021-04-25 ENCOUNTER — Ambulatory Visit (INDEPENDENT_AMBULATORY_CARE_PROVIDER_SITE_OTHER): Payer: Medicare HMO | Admitting: Urology

## 2021-04-25 VITALS — BP 129/76 | HR 56 | Ht 68.5 in | Wt 202.0 lb

## 2021-04-25 DIAGNOSIS — N200 Calculus of kidney: Secondary | ICD-10-CM

## 2021-04-25 DIAGNOSIS — N481 Balanitis: Secondary | ICD-10-CM | POA: Diagnosis not present

## 2021-04-25 NOTE — Progress Notes (Signed)
° °  04/25/2021 10:55 AM   Willie Clark [birth year] [id number]  Reason for visit: Follow up balanitis, nephrolithiasis  HPI: 71 year old male with history of recurrent nephrolithiasis who presented in November with balanitis and was treated with Mycolog cream.  He reports his symptoms have completely resolved and he denies any dysuria or penile itching.  On exam he has a normal-appearing uncircumcised phallus with no lesions or evidence of residual infection.  CT November 2022 with no significant evidence of stone disease or hydronephrosis.  We discussed general stone prevention strategies including adequate hydration with goal of producing 2.5 L of urine daily, increasing citric acid intake, increasing calcium intake during high oxalate meals, minimizing animal protein, and decreasing salt intake. Information about dietary recommendations given today.   Follow-up with urology as needed   [place], [place]ane [address] [place], [place] [address] [phone]

## 2021-04-25 NOTE — Patient Instructions (Signed)
Dietary Guidelines to Help Prevent Kidney Stones Kidney stones are deposits of minerals and salts that form inside your kidneys. Your risk of developing kidney stones may be greater depending on your diet, your lifestyle, the medicines you take, and whether you have certain medical conditions. Most people can lower their chances of developing kidney stones by following the instructions below. Your dietitian may give you more specific instructions depending on your overall health and the type of kidney stones you tend to develop. What are tips for following this plan? Reading food labels  Choose foods with "no salt added" or "low-salt" labels. Limit your salt (sodium) intake to less than 1,500 mg a day. Choose foods with calcium for each meal and snack. Try to eat about 300 mg of calcium at each meal. Foods that contain 200-500 mg of calcium a serving include: 8 oz (237 mL) of milk, calcium-fortifiednon-dairy milk, and calcium-fortifiedfruit juice. Calcium-fortified means that calcium has been added to these drinks. 8 oz (237 mL) of kefir, yogurt, and soy yogurt. 4 oz (114 g) of tofu. 1 oz (28 g) of cheese. 1 cup (150 g) of dried figs. 1 cup (91 g) of cooked broccoli. One 3 oz (85 g) can of sardines or mackerel. Most people need 1,000-1,500 mg of calcium a day. Talk to your dietitian about how much calcium is recommended for you. Shopping Buy plenty of fresh fruits and vegetables. Most people do not need to avoid fruits and vegetables, even if these foods contain nutrients that may contribute to kidney stones. When shopping for convenience foods, choose: Whole pieces of fruit. Pre-made salads with dressing on the side. Low-fat fruit and yogurt smoothies. Avoid buying frozen meals or prepared deli foods. These can be high in sodium. Look for foods with live cultures, such as yogurt and kefir. Choose high-fiber grains, such as whole-wheat breads, oat bran, and wheat cereals. Cooking Do not add  salt to food when cooking. Place a salt shaker on the table and allow each person to add his or her own salt to taste. Use vegetable protein, such as beans, textured vegetable protein (TVP), or tofu, instead of meat in pasta, casseroles, and soups. Meal planning Eat less salt, if told by your dietitian. To do this: Avoid eating processed or pre-made food. Avoid eating fast food. Eat less animal protein, including cheese, meat, poultry, or fish, if told by your dietitian. To do this: Limit the number of times you have meat, poultry, fish, or cheese each week. Eat a diet free of meat at least 2 days a week. Eat only one serving each day of meat, poultry, fish, or seafood. When you prepare animal protein, cut pieces into small portion sizes. For most meat and fish, one serving is about the size of the palm of your hand. Eat at least five servings of fresh fruits and vegetables each day. To do this: Keep fruits and vegetables on hand for snacks. Eat one piece of fruit or a handful of berries with breakfast. Have a salad and fruit at lunch. Have two kinds of vegetables at dinner. Limit foods that are high in a substance called oxalate. These include: Spinach (cooked), rhubarb, beets, sweet potatoes, and Swiss chard. Peanuts. Potato chips, french fries, and baked potatoes with skin on. Nuts and nut products. Chocolate. If you regularly take a diuretic medicine, make sure to eat at least 1 or 2 servings of fruits or vegetables that are high in potassium each day. These include: Avocado. Banana. Orange, prune,   carrot, or tomato juice. Baked potato. Cabbage. Beans and split peas. Lifestyle  Drink enough fluid to keep your urine pale yellow. This is the most important thing you can do. Spread your fluid intake throughout the day. If you drink alcohol: Limit how much you use to: 0-1 drink a day for women who are not pregnant. 0-2 drinks a day for men. Be aware of how much alcohol is in your  drink. In the [place] one drink equals one 12 oz bottle of beer (355 mL), one 5 oz glass of wine (148 mL), or one 1 oz glass of hard liquor (44 mL). Lose weight if told by your health care provider. Work with your dietitian to find an eating plan and weight loss strategies that work best for you. General information Talk to your health care provider and dietitian about taking daily supplements. You may be told the following depending on your health and the cause of your kidney stones: Not to take supplements with vitamin C. To take a calcium supplement. To take a daily probiotic supplement. To take other supplements such as magnesium, fish oil, or vitamin B6. Take over-the-counter and prescription medicines only as told by your health care provider. These include supplements. What foods should I limit? Limit your intake of the following foods, or eat them as told by your dietitian. Vegetables Spinach. Rhubarb. Beets. Canned vegetables. Pickles. Olives. Baked potatoes with skin. Grains Wheat bran. Baked goods. Salted crackers. Cereals high in sugar. Meats and other proteins Nuts. Nut butters. Large portions of meat, poultry, or fish. Salted, precooked, or cured meats, such as sausages, meat loaves, and hot dogs. Dairy Cheese. Beverages Regular soft drinks. Regular vegetable juice. Seasonings and condiments Seasoning blends with salt. Salad dressings. Soy sauce. Ketchup. Barbecue sauce. Other foods Canned soups. Canned pasta sauce. Casseroles. Pizza. Lasagna. Frozen meals. Potato chips. French fries. The items listed above may not be a complete list of foods and beverages you should limit. Contact a dietitian for more information. What foods should I avoid? Talk to your dietitian about specific foods you should avoid based on the type of kidney stones you have and your overall health. Fruits Grapefruit. The item listed above may not be a complete list of foods and beverages you should  avoid. Contact a dietitian for more information. Summary Kidney stones are deposits of minerals and salts that form inside your kidneys. You can lower your risk of kidney stones by making changes to your diet. The most important thing you can do is drink enough fluid. Drink enough fluid to keep your urine pale yellow. Talk to your dietitian about how much calcium you should have each day, and eat less salt and animal protein as told by your dietitian. This information is not intended to replace advice given to you by your health care provider. Make sure you discuss any questions you have with your health care provider. Document Revised: 04/01/2019 Document Reviewed: 04/01/2019 Elsevier Patient Education  2022 [employer] Inc.  

## 2021-05-03 ENCOUNTER — Telehealth: Payer: Self-pay | Admitting: Cardiovascular Disease

## 2021-05-03 NOTE — Telephone Encounter (Signed)
Patient would like to return to [facility]. Please call to discuss.

## 2021-05-03 NOTE — Telephone Encounter (Signed)
Okay; thanks.

## 2021-05-03 NOTE — Telephone Encounter (Signed)
Spoke with patient and reviewed request. He requests referral to [facility]. He called them this morning and was told he needed referral. Patient states he "feels like he needs this again". Referral placed and they should reach out to get that set up. He verbalized understanding with no further questions.

## 2021-05-08 ENCOUNTER — Encounter: Payer: Medicare HMO | Attending: Internal Medicine | Admitting: *Deleted

## 2021-05-08 ENCOUNTER — Encounter: Payer: Self-pay | Admitting: *Deleted

## 2021-05-08 ENCOUNTER — Other Ambulatory Visit: Payer: Self-pay

## 2021-05-08 DIAGNOSIS — I5022 Chronic systolic (congestive) heart failure: Secondary | ICD-10-CM | POA: Insufficient documentation

## 2021-05-08 NOTE — Progress Notes (Signed)
Virtual orientation call completed today. he has an appointment on Date: 05/23/2021  for EP eval and gym Orientation.  Documentation of diagnosis can be found in [facility]  Date: 03/13/2021 .

## 2021-05-23 ENCOUNTER — Other Ambulatory Visit: Payer: Self-pay

## 2021-05-23 ENCOUNTER — Encounter: Payer: Medicare HMO | Attending: Internal Medicine | Admitting: *Deleted

## 2021-05-23 VITALS — Ht 70.0 in | Wt 207.7 lb

## 2021-05-23 DIAGNOSIS — I5022 Chronic systolic (congestive) heart failure: Secondary | ICD-10-CM | POA: Diagnosis present

## 2021-05-23 NOTE — Patient Instructions (Signed)
Patient Instructions  Patient Details  Name: Willie Clark MRN: [id number] Date of Birth: June 27, 1950 Referring Provider:  Wellington Hampshire, MD  Below are your personal goals for exercise, nutrition, and risk factors. Our goal is to help you stay on track towards obtaining and maintaining these goals. We will be discussing your progress on these goals with you throughout the program.  Initial Exercise Prescription:  Initial Exercise Prescription - 05/23/21 1400       Date of Initial Exercise RX and Referring Provider   Date 05/23/21    Referring Provider [name] MD      Oxygen   Maintain Oxygen Saturation 88% or higher      Treadmill   MPH 2.5    Grade 0.5    Minutes 15    METs 3.09      Arm Ergometer   Level 1    Watts 25    RPM 25    Minutes 15    METs 2      REL-XR   Level 3    Speed 50    Minutes 15    METs 3      Track   Laps 36    Minutes 15    METs 2.96      Prescription Details   Frequency (times per week) 3    Duration Progress to 30 minutes of continuous aerobic without signs/symptoms of physical distress      Intensity   THRR 40-80% of Max Heartrate 96-132    Ratings of Perceived Exertion 11-13    Perceived Dyspnea 0-4      Progression   Progression Continue to progress workloads to maintain intensity without signs/symptoms of physical distress.      Resistance Training   Training Prescription Yes    Weight 4 lb    Reps 10-15             Exercise Goals: Frequency: Be able to perform aerobic exercise two to three times per week in program working toward 2-5 days per week of home exercise.  Intensity: Work with a perceived exertion of 11 (fairly light) - 15 (hard) while following your exercise prescription.  We will make changes to your prescription with you as you progress through the program.   Duration: Be able to do 30 to 45 minutes of continuous aerobic exercise in addition to a 5 minute warm-up and a 5 minute cool-down  routine.   Nutrition Goals: Your personal nutrition goals will be established when you do your nutrition analysis with the dietician.  The following are general nutrition guidelines to follow: Cholesterol < 200mg /day Sodium < 1500mg /day Fiber: Men over 50 yrs - 30 grams per day  Personal Goals:  Personal Goals and Risk Factors at Admission - 05/23/21 1449       Core Components/Risk Factors/Patient Goals on Admission    Weight Management Yes;Weight Loss    Intervention Weight Management: Develop a combined nutrition and exercise program designed to reach desired caloric intake, while maintaining appropriate intake of nutrient and fiber, sodium and fats, and appropriate energy expenditure required for the weight goal.;Weight Management: Provide education and appropriate resources to help participant work on and attain dietary goals.;Weight Management/Obesity: Establish reasonable short term and long term weight goals.    Admit Weight 207 lb 11.2 oz (94.2 kg)    Goal Weight: Short Term 200 lb (90.7 kg)    Goal Weight: Long Term 190 lb (86.2 kg)    Expected Outcomes  Short Term: Continue to assess and modify interventions until short term weight is achieved;Long Term: Adherence to nutrition and physical activity/exercise program aimed toward attainment of established weight goal;Weight Loss: Understanding of general recommendations for a balanced deficit meal plan, which promotes 1-2 lb weight loss per week and includes a negative energy balance of [phone] kcal/d;Understanding recommendations for meals to include 15-35% energy as protein, 25-35% energy from fat, 35-60% energy from carbohydrates, less than 200mg  of dietary cholesterol, 20-35 gm of total fiber daily;Understanding of distribution of calorie intake throughout the day with the consumption of 4-5 meals/snacks    Improve shortness of breath with ADL's Yes    Intervention Provide education, individualized exercise plan and daily activity  instruction to help decrease symptoms of SOB with activities of daily living.    Expected Outcomes Short Term: Improve cardiorespiratory fitness to achieve a reduction of symptoms when performing ADLs;Long Term: Be able to perform more ADLs without symptoms or delay the onset of symptoms    Increase knowledge of respiratory medications and ability to use respiratory devices properly  Yes    Intervention Provide education and demonstration as needed of appropriate use of medications, inhalers, and oxygen therapy.    Expected Outcomes Short Term: Achieves understanding of medications use. Understands that oxygen is a medication prescribed by physician. Demonstrates appropriate use of inhaler and oxygen therapy.;Long Term: Maintain appropriate use of medications, inhalers, and oxygen therapy.    Heart Failure Yes    Intervention Provide a combined exercise and nutrition program that is supplemented with education, support and counseling about heart failure. Directed toward relieving symptoms such as shortness of breath, decreased exercise tolerance, and extremity edema.    Expected Outcomes Improve functional capacity of life;Short term: Attendance in program 2-3 days a week with increased exercise capacity. Reported lower sodium intake. Reported increased fruit and vegetable intake. Reports medication compliance.;Short term: Daily weights obtained and reported for increase. Utilizing diuretic protocols set by physician.;Long term: Adoption of self-care skills and reduction of barriers for early signs and symptoms recognition and intervention leading to self-care maintenance.    Hypertension Yes    Intervention Provide education on lifestyle modifcations including regular physical activity/exercise, weight management, moderate sodium restriction and increased consumption of fresh fruit, vegetables, and low fat dairy, alcohol moderation, and smoking cessation.;Monitor prescription use compliance.    Expected  Outcomes Short Term: Continued assessment and intervention until BP is < 140/29mm HG in hypertensive participants. < 130/1mm HG in hypertensive participants with diabetes, heart failure or chronic kidney disease.;Long Term: Maintenance of blood pressure at goal levels.    Lipids Yes    Intervention Provide education and support for participant on nutrition & aerobic/resistive exercise along with prescribed medications to achieve LDL 70mg , HDL >40mg .    Expected Outcomes Short Term: Participant states understanding of desired cholesterol values and is compliant with medications prescribed. Participant is following exercise prescription and nutrition guidelines.;Long Term: Cholesterol controlled with medications as prescribed, with individualized exercise RX and with personalized nutrition plan. Value goals: LDL < 70mg , HDL > 40 mg.             Tobacco Use Initial Evaluation: Social History   Tobacco Use  Smoking Status Former   Packs/day: 2.50   Types: Cigarettes   Quit date: 08/21/1991   Years since quitting: 29.7  Smokeless Tobacco Never    Exercise Goals and Review:  Exercise Goals     Row Name  05/23/21 1447  Increase Physical Activity  Yes      Intervention  Provide advice, education, support and counseling about physical activity/exercise needs.;Develop an individualized exercise prescription for aerobic and resistive training based on initial evaluation findings, risk stratification, comorbidities and participant's personal goals.      Expected Outcomes  Short Term: Attend rehab on a regular basis to increase amount of physical activity.;Long Term: Add in home exercise to make exercise part of routine and to increase amount of physical activity.;Long Term: Exercising regularly at least 3-5 days a week.      Increase Strength and Stamina  Yes      Intervention  Provide advice, education, support and counseling about physical activity/exercise needs.;Develop an  individualized exercise prescription for aerobic and resistive training based on initial evaluation findings, risk stratification, comorbidities and participant's personal goals.      Expected Outcomes  Short Term: Increase workloads from initial exercise prescription for resistance, speed, and METs.;Short Term: Perform resistance training exercises routinely during rehab and add in resistance training at home;Long Term: Improve cardiorespiratory fitness, muscular endurance and strength as measured by increased METs and functional capacity (6MWT)      Able to understand and use rate of perceived exertion (RPE) scale  Yes      Intervention  Provide education and explanation on how to use RPE scale      Expected Outcomes  Short Term: Able to use RPE daily in rehab to express subjective intensity level;Long Term:  Able to use RPE to guide intensity level when exercising independently      Able to understand and use Dyspnea scale  Yes      Intervention  Provide education and explanation on how to use Dyspnea scale      Expected Outcomes  Short Term: Able to use Dyspnea scale daily in rehab to express subjective sense of shortness of breath during exertion;Long Term: Able to use Dyspnea scale to guide intensity level when exercising independently      Knowledge and understanding of Target Heart Rate Range (THRR)  Yes      Intervention  Provide education and explanation of THRR including how the numbers were predicted and where they are located for reference      Expected Outcomes  Short Term: Able to state/look up THRR;Long Term: Able to use THRR to govern intensity when exercising independently;Short Term: Able to use daily as guideline for intensity in rehab      Able to check pulse independently  Yes      Intervention  Provide education and demonstration on how to check pulse in carotid and radial arteries.;Review the importance of being able to check your own pulse for safety during independent exercise       Expected Outcomes  Short Term: Able to explain why pulse checking is important during independent exercise;Long Term: Able to check pulse independently and accurately      Understanding of Exercise Prescription  Yes      Intervention  Provide education, explanation, and written materials on patient's individual exercise prescription      Expected Outcomes  Short Term: Able to explain program exercise prescription;Long Term: Able to explain home exercise prescription to exercise independently               Copy of goals given to participant.

## 2021-05-23 NOTE — Progress Notes (Signed)
Pulmonary Individual Treatment Plan  Patient Details  Name: Willie Clark MRN: [id number] Date of Birth: Sep 21, 1950 Referring Provider:   Flowsheet Row Pulmonary Rehab from 05/23/2021 in [facility] Cardiac and Pulmonary Rehab  Referring Provider [name] MD       Initial Encounter Date:  Flowsheet Row Pulmonary Rehab from 05/23/2021 in [facility] Cardiac and Pulmonary Rehab  Date 05/23/21       Visit Diagnosis: Heart failure, chronic systolic ([place])  Patient's Home Medications on Admission:  Current Outpatient Medications:    albuterol (PROVENTIL HFA;VENTOLIN HFA) 108 (90 Base) MCG/ACT inhaler, Inhale into the lungs every 4 (four) hours as needed for wheezing or shortness of breath., Disp: , Rfl:    aspirin EC 81 MG EC tablet, Take 1 tablet (81 mg total) by mouth daily. Swallow whole., Disp: 30 tablet, Rfl: 11   carvedilol (COREG) 3.125 MG tablet, TAKE 1 TABLET (3.125 MG TOTAL) BY MOUTH 2 (TWO) TIMES DAILY WITH A MEAL., Disp: 180 tablet, Rfl: 0   citalopram (CELEXA) 20 MG tablet, Take 20 mg by mouth daily. , Disp: , Rfl:    clopidogrel (PLAVIX) 75 MG tablet, TAKE (1) TABLET 75 MG DAILY., Disp: 90 tablet, Rfl: 3   dicyclomine (BENTYL) 10 MG capsule, TAKE 1 CAPSULE THREE TIMES DAILY AS NEEDED, Disp: , Rfl:    docusate calcium (SURFAK) 240 MG capsule, Take 240 mg by mouth daily., Disp: , Rfl:    empagliflozin (JARDIANCE) 10 MG TABS tablet, Take 1 tablet (10 mg total) by mouth daily before breakfast., Disp: 90 tablet, Rfl: 3   EPINEPHrine 0.3 mg/0.3 mL IJ SOAJ injection, Inject 0.3 mg into the muscle as needed. , Disp: , Rfl:    fexofenadine (ALLEGRA) 180 MG tablet, Take 180 mg by mouth daily., Disp: , Rfl:    finasteride (PROSCAR) 5 MG tablet, Take 5 mg by mouth daily., Disp: , Rfl:    furosemide (LASIX) 20 MG tablet, Take 20 mg by mouth daily., Disp: , Rfl:    gabapentin (NEURONTIN) 800 MG tablet, Take 1 tablet by mouth 3 (three) times daily., Disp: , Rfl:    isosorbide mononitrate (IMDUR) 60  MG 24 hr tablet, Take 1 tablet (60 mg total) by mouth daily., Disp: 90 tablet, Rfl: 3   KLOR-CON M20 20 MEQ tablet, TAKE 1 TABLET BY MOUTH EVERY DAY, Disp: 90 tablet, Rfl: 0   losartan (COZAAR) 25 MG tablet, TAKE 1 TABLET EVERY DAY, Disp: 90 tablet, Rfl: 0   mesalamine (APRISO) 0.375 g 24 hr capsule, Take 1.4 mg by mouth daily., Disp: , Rfl:    Multiple Vitamins-Minerals (HAIR SKIN AND NAILS FORMULA PO), Take 1 capsule by mouth daily., Disp: , Rfl:    NALTREXONE HCL PO, Take 2.5 mg by mouth daily., Disp: , Rfl:    nitroGLYCERIN (NITROSTAT) 0.4 MG SL tablet, Place 1 tablet (0.4 mg total) under the tongue every 5 (five) minutes as needed for chest pain., Disp: 90 tablet, Rfl: 1   nystatin-triamcinolone ointment (MYCOLOG), Apply 1 application topically 2 (two) times daily., Disp: 30 g, Rfl: 0   ondansetron (ZOFRAN) 4 MG tablet, Take 4 mg by mouth every 8 (eight) hours as needed for nausea or vomiting., Disp: , Rfl:    oxyCODONE (OXY IR/ROXICODONE) 5 MG immediate release tablet, 0.5-1 mg every 6 (six) hours as needed., Disp: , Rfl:    pantoprazole (PROTONIX) 40 MG tablet, Take 40 mg by mouth 2 (two) times daily. , Disp: , Rfl:    Peppermint Oil (IBGARD PO),  Take by mouth daily at 2 am., Disp: , Rfl:    Probiotic Product (PROBIOTIC PO), Take by mouth daily at 8 pm., Disp: , Rfl:    rOPINIRole (REQUIP) 1 MG tablet, Take 1 mg by mouth 2 (two) times daily., Disp: , Rfl:    rosuvastatin (CRESTOR) 40 MG tablet, TAKE 1 TABLET BY MOUTH EVERY DAY, Disp: 90 tablet, Rfl: 3   spironolactone (ALDACTONE) 25 MG tablet, TAKE 1 TABLET EVERY DAY, Disp: 90 tablet, Rfl: 1   torsemide (DEMADEX) 20 MG tablet, Take 2 tablets (40 mg total) by mouth daily., Disp: 180 tablet, Rfl: 2   traMADol (ULTRAM) 50 MG tablet, Take 50 mg by mouth every 6 (six) hours as needed., Disp: , Rfl:    VASCEPA 1 g capsule, TAKE 2 CAPSULES TWICE DAILY, Disp: 360 capsule, Rfl: 1   vitamin B-12 (CYANOCOBALAMIN) 1000 MCG tablet, Take 1,000 mcg by  mouth daily., Disp: , Rfl:   Past Medical History: Past Medical History:  Diagnosis Date   Anxiety    Arthritis    Barrett's esophagus    Bowel obstruction ([place]) 02/2009   small bowel   CHF (congestive heart failure) (HCC)    Chronic chest pain    Coronary artery disease    a. 1993 CABG; b. 2007 Redo CABG; c. 09/2015 Cath: LM mild dzs, LAD 100ost, RI mild dzs, LCX 60p, OM1 100, RCA 100ost, VG->OM2 min irregs, LIMA->LAD nl, VG->RPDA 30ost, 20d, nl EF->Med rx.   Depression    Diverticulitis    Diverticulosis    Erectile dysfunction    Fibromyalgia    Gastric ulcer    Gastritis    GERD (gastroesophageal reflux disease)    Headache    Hemorrhoids    HFrEF (heart failure with reduced ejection fraction) ([place])    a. 09/2016 Echo: EF 55-60%, no rwma, triv AI, mild MR, mildly dil LA. -> as of April 2020: EF 40 to 45%.  Septal dyssynergy/hypokinesis due to postop state but otherwise unable to assess wall motion [name] due to poor study.   History of myocardial infarct at age less than 33 years    Hyperlipidemia    Hypertension    Kidney stones    going to [facility]  June 26 to see kidney specialist   Lightheadedness    Mass on back    Nephrolithiasis    Orthostatic hypotension    a. Improved after discontinuation of metoprolol.   OSA (obstructive sleep apnea)    a. On CPAP.   Pulmonary embolism ([place]) 01/2006   Post-op/treated   Skin lesions, generalized    Stroke [facility]) 1993   right side weakness no blood thinners   on Aspirin   Syncope and collapse    Urticaria     Tobacco Use: Social History   Tobacco Use  Smoking Status Former   Packs/day: 2.50   Types: Cigarettes   Quit date: 08/21/1991   Years since quitting: 29.7  Smokeless Tobacco Never    Labs: Recent Review Flowsheet Data     Labs for ITP Cardiac and Pulmonary Rehab Latest Ref Rng & Units 08/01/2018 12/11/2019 12/13/2019 02/07/2020 11/14/2020   Cholestrol 0 - 200 mg/dL 154 - 181 165 -   LDLCALC 0 - 99 mg/dL  56 - 84 61 -   HDL >40 mg/dL 41 - 35(L) 41 -   Trlycerides <150 mg/dL 285(H) - 312(H) 317(H) -   Hemoglobin A1c 4.8 - 5.6 % - 6.0(H) - - 6.1(H)  Pulmonary Assessment Scores:  Pulmonary Assessment Scores     Row Name 05/23/21 1450         ADL UCSD   ADL Phase Entry     SOB Score total 40     Rest 1     Walk 3     Stairs 3     Bath 2     Dress 2     Shop 0       CAT Score   CAT Score 13       mMRC Score   mMRC Score 1              UCSD: Self-administered rating of dyspnea associated with activities of daily living (ADLs) 6-point scale (0 = "not at all" to 5 = "maximal or unable to do because of breathlessness")  Scoring Scores range from 0 to 120.  Minimally important difference is 5 units  CAT: CAT can identify the health impairment of COPD patients and is better correlated with disease progression.  CAT has a scoring range of zero to 40. The CAT score is classified into four groups of low (less than 10), medium (10 - 20), high (21-30) and very high (31-40) based on the impact level of disease on health status. A CAT score over 10 suggests significant symptoms.  A worsening CAT score could be explained by an exacerbation, poor medication adherence, poor inhaler technique, or progression of COPD or comorbid conditions.  CAT MCID is 2 points  mMRC: mMRC (Modified Medical Research Council) Dyspnea Scale is used to assess the degree of baseline functional disability in patients of respiratory disease due to dyspnea. No minimal important difference is established. A decrease in score of 1 point or greater is considered a positive change.   Pulmonary Function Assessment:   Exercise Target Goals: Exercise Program Goal: Individual exercise prescription set using results from initial 6 min walk test and THRR while considering  patients activity barriers and safety.   Exercise Prescription Goal: Initial exercise prescription builds to 30-45 minutes a day of  aerobic activity, 2-3 days per week.  Home exercise guidelines will be given to patient during program as part of exercise prescription that the participant will acknowledge.  Education: Aerobic Exercise: - Group verbal and visual presentation on the components of exercise prescription. Introduces F.I.T.T principle from ACSM for exercise prescriptions.  Reviews F.I.T.T. principles of aerobic exercise including progression. Written material given at graduation. Flowsheet Row Cardiac Rehab from 09/13/2020 in [facility] Cardiac and Pulmonary Rehab  Education need identified 07/12/20  Date 08/09/20  Educator AS  Instruction Review Code 1- Verbalizes Understanding       Education: Resistance Exercise: - Group verbal and visual presentation on the components of exercise prescription. Introduces F.I.T.T principle from ACSM for exercise prescriptions  Reviews F.I.T.T. principles of resistance exercise including progression. Written material given at graduation. Flowsheet Row Cardiac Rehab from 09/13/2020 in [facility] Cardiac and Pulmonary Rehab  Date 08/16/20  Educator AS  Instruction Review Code 1- Verbalizes Understanding        Education: Exercise & Equipment Safety: - Individual verbal instruction and demonstration of equipment use and safety with use of the equipment. Flowsheet Row Pulmonary Rehab from 05/23/2021 in [facility] Cardiac and Pulmonary Rehab  Date 05/23/21  Educator [facility]  Instruction Review Code 1- Verbalizes Understanding       Education: Exercise Physiology & General Exercise Guidelines: - Group verbal and written instruction with models to review the exercise physiology of the cardiovascular  system and associated critical values. Provides general exercise guidelines with specific guidelines to those with heart or lung disease.  Flowsheet Row Cardiac Rehab from 09/13/2020 in [facility] Cardiac and Pulmonary Rehab  Date 08/02/20  Educator [facility]  Instruction Review Code 1- Verbalizes Understanding        Education: Flexibility, Balance, Mind/Body Relaxation: - Group verbal and visual presentation with interactive activity on the components of exercise prescription. Introduces F.I.T.T principle from ACSM for exercise prescriptions. Reviews F.I.T.T. principles of flexibility and balance exercise training including progression. Also discusses the mind body connection.  Reviews various relaxation techniques to help reduce and manage stress (i.e. Deep breathing, progressive muscle relaxation, and visualization). Balance handout provided to take home. Written material given at graduation. Flowsheet Row Cardiac Rehab from 01/17/2021 in [facility] Cardiac and Pulmonary Rehab  Education need identified 12/12/20       Activity Barriers & Risk Stratification:  Activity Barriers & Cardiac Risk Stratification - 05/23/21 1444       Activity Barriers & Cardiac Risk Stratification   Activity Barriers Fibromyalgia;Deconditioning;Shortness of Breath;Balance Concerns;Back Problems;Neck/Spine Problems;Arthritis;Muscular Weakness;Decreased Ventricular Function             6 Minute Walk:  6 Minute Walk     Row Name 12/12/20 0858 02/26/21 0933 05/23/21 1442     6 Minute Walk   Phase Initial Discharge Initial   Distance 1215 feet 1500 feet 1460 feet   Distance % Change -- 23 % --   Distance Feet Change -- 285 ft --   Walk Time 6 minutes 6 minutes 6 minutes   # of Rest Breaks 0 0 0   MPH 2.3 2.84 2.77   METS 2.85 3.1 3.02   RPE 13 15 13    Perceived Dyspnea  3 1 2    VO2 Peak 9.96 10.86 10.56   Symptoms Yes (comment) No Yes (comment)   Comments SOB, ankle pain 10/10, knee pain 8/10 (Chronic pain) -- SOB   Resting HR 65 bpm 60 bpm 60 bpm   Resting BP 144/84 110/70 136/76   Resting Oxygen Saturation  97 % 95 % 94 %   Exercise Oxygen Saturation  during 6 min walk 98 % 95 % 93 %   Max Ex. HR 98 bpm 81 bpm 84 bpm   Max Ex. BP 152/64 142/70 146/72   2 Minute Post BP 136/64 -- 122/64     Interval  HR   1 Minute HR -- -- 74   2 Minute HR -- -- 80   3 Minute HR -- -- 83   4 Minute HR -- -- 83   5 Minute HR -- -- 83   6 Minute HR -- -- 84   2 Minute Post HR -- -- 57   Interval Heart Rate? -- -- Yes     Interval Oxygen   Interval Oxygen? -- -- Yes   Baseline Oxygen Saturation % -- -- 94 %   1 Minute Oxygen Saturation % -- -- 94 %   1 Minute Liters of Oxygen -- -- 0 L  Room Air   2 Minute Oxygen Saturation % -- -- 94 %   2 Minute Liters of Oxygen -- -- 0 L   3 Minute Oxygen Saturation % -- -- 94 %   3 Minute Liters of Oxygen -- -- 0 L   4 Minute Oxygen Saturation % -- -- 93 %   4 Minute Liters of Oxygen -- -- 0 L   5 Minute Oxygen Saturation % -- --  93 %   5 Minute Liters of Oxygen -- -- 0 L   6 Minute Oxygen Saturation % -- -- 93 %   6 Minute Liters of Oxygen -- -- 0 L   2 Minute Post Oxygen Saturation % -- -- 96 %   2 Minute Post Liters of Oxygen -- -- 0 L           Oxygen Initial Assessment:  Oxygen Initial Assessment - 05/23/21 1449       Home Oxygen   Home Oxygen Device None    Sleep Oxygen Prescription None    Home Exercise Oxygen Prescription None    Home Resting Oxygen Prescription None    Compliance with Home Oxygen Use Yes      Initial 6 min Walk   Oxygen Used None      Program Oxygen Prescription   Program Oxygen Prescription None      Intervention   Short Term Goals To learn and demonstrate proper pursed lip breathing techniques or other breathing techniques. ;To learn and demonstrate proper use of respiratory medications;To learn and understand importance of monitoring SPO2 with pulse oximeter and demonstrate accurate use of the pulse oximeter.;To learn and understand importance of maintaining oxygen saturations>88%    Long  Term Goals Exhibits proper breathing techniques, such as pursed lip breathing or other method taught during program session;Compliance with respiratory medication;Demonstrates proper use of MDIs;Maintenance of O2  saturations>88%;Verbalizes importance of monitoring SPO2 with pulse oximeter and return demonstration             Oxygen Re-Evaluation:   Oxygen Discharge (Final Oxygen Re-Evaluation):   Initial Exercise Prescription:  Initial Exercise Prescription - 05/23/21 1400       Date of Initial Exercise RX and Referring Provider   Date 05/23/21    Referring Provider [name] MD      Oxygen   Maintain Oxygen Saturation 88% or higher      Treadmill   MPH 2.5    Grade 0.5    Minutes 15    METs 3.09      Arm Ergometer   Level 1    Watts 25    RPM 25    Minutes 15    METs 2      REL-XR   Level 3    Speed 50    Minutes 15    METs 3      Track   Laps 36    Minutes 15    METs 2.96      Prescription Details   Frequency (times per week) 3    Duration Progress to 30 minutes of continuous aerobic without signs/symptoms of physical distress      Intensity   THRR 40-80% of Max Heartrate 96-132    Ratings of Perceived Exertion 11-13    Perceived Dyspnea 0-4      Progression   Progression Continue to progress workloads to maintain intensity without signs/symptoms of physical distress.      Resistance Training   Training Prescription Yes    Weight 4 lb    Reps 10-15             Perform Capillary Blood Glucose checks as needed.  Exercise Prescription Changes:   Exercise Prescription Changes     Row Name 12/12/20 0800 12/18/20 1400 12/22/20 1000 01/02/21 1200 01/16/21 0800     Response to Exercise   Blood Pressure (Admit) 144/84 146/70 -- 110/62 118/62   Blood Pressure (Exercise) 152/64 128/78 --  130/62 --   Blood Pressure (Exit) 136/64 124/64 -- 104/62 110/70   Heart Rate (Admit) 65 bpm 59 bpm -- 68 bpm 97 bpm   Heart Rate (Exercise) 98 bpm 97 bpm -- 95 bpm 112 bpm   Heart Rate (Exit) 72 bpm 55 bpm -- 77 bpm 84 bpm   Oxygen Saturation (Admit) 97 % -- -- -- --   Oxygen Saturation (Exercise) 98 % -- -- -- --   Rating of Perceived Exertion (Exercise)  13 13 -- 13 13   Perceived Dyspnea (Exercise) 3 -- -- -- --   Symptoms SOB, ankle and knee pain none -- none none   Comments walk test results third full day of exercise -- -- --   Duration -- Continue with 30 min of aerobic exercise without signs/symptoms of physical distress. -- Continue with 30 min of aerobic exercise without signs/symptoms of physical distress. Continue with 30 min of aerobic exercise without signs/symptoms of physical distress.   Intensity -- THRR unchanged -- THRR unchanged THRR unchanged     Progression   Progression -- Continue to progress workloads to maintain intensity without signs/symptoms of physical distress. -- Continue to progress workloads to maintain intensity without signs/symptoms of physical distress. Continue to progress workloads to maintain intensity without signs/symptoms of physical distress.   Average METs -- 2.94 -- 2.6 3.73     Resistance Training   Training Prescription -- Yes -- Yes Yes   Weight -- 4 lb -- 4 lb 4 lb   Reps -- 10-15 -- 10-15 10-15     Interval Training   Interval Training -- No -- No No     Treadmill   MPH -- 2.3 -- -- 2.4   Grade -- 0.5 -- -- 4.5   Minutes -- 15 -- -- 15   METs -- 2.92 -- -- 4.33     Recumbant Bike   Level -- 7 -- 7 10   Watts -- 26 -- 33 43   Minutes -- 15 -- 15 15   METs -- 2.83 -- 2.9 4     REL-XR   Level -- 6 -- -- 8   Minutes -- 15 -- -- 15   METs -- 3.7 -- -- 4.5     T5 Nustep   Level -- 6 -- 6 7   Minutes -- 15 -- 15 15   METs -- 2.3 -- 2.3 2.5     Home Exercise Plan   Plans to continue exercise at -- -- Home (comment)  walking, treadmill -- Home (comment)  walking, treadmill   Frequency -- -- Add 2 additional days to program exercise sessions. -- Add 2 additional days to program exercise sessions.   Initial Home Exercises Provided -- -- 12/22/20 -- 12/22/20     Oxygen   Maintain Oxygen Saturation -- -- -- -- 88% or higher    Row Name 01/29/21 1400 02/12/21 1100 02/26/21 1300  05/23/21 1400       Response to Exercise   Blood Pressure (Admit) 124/70 116/72 114/62 136/76    Blood Pressure (Exercise) -- -- -- 146/72    Blood Pressure (Exit) 126/70 120/60 118/60 112/60    Heart Rate (Admit) 61 bpm 58 bpm 62 bpm 60 bpm    Heart Rate (Exercise) 89 bpm 90 bpm 92 bpm 84 bpm    Heart Rate (Exit) 66 bpm 69 bpm 65 bpm 63 bpm    Oxygen Saturation (Admit) -- -- -- 94 %  Oxygen Saturation (Exercise) -- -- -- 93 %    Oxygen Saturation (Exit) -- -- -- 98 %    Rating of Perceived Exertion (Exercise) 13 13 13 13     Perceived Dyspnea (Exercise) 1 -- -- 2    Symptoms none none none SOB    Comments -- -- -- walk test results    Duration Continue with 30 min of aerobic exercise without signs/symptoms of physical distress. Continue with 30 min of aerobic exercise without signs/symptoms of physical distress. Continue with 30 min of aerobic exercise without signs/symptoms of physical distress. --    Intensity THRR unchanged THRR unchanged THRR unchanged --      Progression   Progression Continue to progress workloads to maintain intensity without signs/symptoms of physical distress. Continue to progress workloads to maintain intensity without signs/symptoms of physical distress. Continue to progress workloads to maintain intensity without signs/symptoms of physical distress. --    Average METs 3.7 3.04 3.7 --      Resistance Training   Training Prescription Yes Yes Yes --    Weight 4 lb 4 lb 4 lb --    Reps 10-15 10-15 10-15 --      Interval Training   Interval Training No No No --      Recumbant Bike   Level -- 9 -- --    Watts -- 40 -- --    Minutes -- 15 -- --    METs -- 2.83 -- --      T5 Nustep   Level -- 7 -- --    Minutes -- 15 -- --    METs -- 2.6 -- --      Biostep-RELP   Level 6 6 7  --    Minutes 15 15 15  --    METs 4 4 4  --      Track   Laps -- 44 -- --    Minutes -- 15 -- --    METs -- 3.39 -- --      Home Exercise Plan   Plans to continue  exercise at Home (comment)  walking, treadmill Home (comment)  walking, treadmill Home (comment)  walking, treadmill --    Frequency Add 2 additional days to program exercise sessions. Add 2 additional days to program exercise sessions. Add 2 additional days to program exercise sessions. --    Initial Home Exercises Provided 12/22/20 12/22/20 12/22/20 --      Oxygen   Maintain Oxygen Saturation -- 88% or higher 88% or higher --             Exercise Comments:   Exercise Comments     Row Name 12/13/20 5638           Exercise Comments First full day of exercise!  Patient was oriented to gym and equipment including functions, settings, policies, and procedures.  Patient's individual exercise prescription and treatment plan were reviewed.  All starting workloads were established based on the results of the 6 minute walk test done at initial orientation visit.  The plan for exercise progression was also introduced and progression will be customized based on patient's performance and goals.                Exercise Goals and Review:   Exercise Goals     Row Name 12/12/20 0756 05/23/21 1447           Exercise Goals   Increase Physical Activity Yes Yes      Intervention Provide advice,  education, support and counseling about physical activity/exercise needs.;Develop an individualized exercise prescription for aerobic and resistive training based on initial evaluation findings, risk stratification, comorbidities and participant's personal goals. Provide advice, education, support and counseling about physical activity/exercise needs.;Develop an individualized exercise prescription for aerobic and resistive training based on initial evaluation findings, risk stratification, comorbidities and participant's personal goals.      Expected Outcomes Short Term: Attend rehab on a regular basis to increase amount of physical activity.;Long Term: Add in home exercise to make exercise part of  routine and to increase amount of physical activity.;Long Term: Exercising regularly at least 3-5 days a week. Short Term: Attend rehab on a regular basis to increase amount of physical activity.;Long Term: Add in home exercise to make exercise part of routine and to increase amount of physical activity.;Long Term: Exercising regularly at least 3-5 days a week.      Increase Strength and Stamina Yes Yes      Intervention Provide advice, education, support and counseling about physical activity/exercise needs.;Develop an individualized exercise prescription for aerobic and resistive training based on initial evaluation findings, risk stratification, comorbidities and participant's personal goals. Provide advice, education, support and counseling about physical activity/exercise needs.;Develop an individualized exercise prescription for aerobic and resistive training based on initial evaluation findings, risk stratification, comorbidities and participant's personal goals.      Expected Outcomes Short Term: Increase workloads from initial exercise prescription for resistance, speed, and METs.;Short Term: Perform resistance training exercises routinely during rehab and add in resistance training at home;Long Term: Improve cardiorespiratory fitness, muscular endurance and strength as measured by increased METs and functional capacity (6MWT) Short Term: Increase workloads from initial exercise prescription for resistance, speed, and METs.;Short Term: Perform resistance training exercises routinely during rehab and add in resistance training at home;Long Term: Improve cardiorespiratory fitness, muscular endurance and strength as measured by increased METs and functional capacity (6MWT)      Able to understand and use rate of perceived exertion (RPE) scale Yes Yes      Intervention Provide education and explanation on how to use RPE scale Provide education and explanation on how to use RPE scale      Expected Outcomes  Short Term: Able to use RPE daily in rehab to express subjective intensity level;Long Term:  Able to use RPE to guide intensity level when exercising independently Short Term: Able to use RPE daily in rehab to express subjective intensity level;Long Term:  Able to use RPE to guide intensity level when exercising independently      Able to understand and use Dyspnea scale Yes Yes      Intervention Provide education and explanation on how to use Dyspnea scale Provide education and explanation on how to use Dyspnea scale      Expected Outcomes Short Term: Able to use Dyspnea scale daily in rehab to express subjective sense of shortness of breath during exertion;Long Term: Able to use Dyspnea scale to guide intensity level when exercising independently Short Term: Able to use Dyspnea scale daily in rehab to express subjective sense of shortness of breath during exertion;Long Term: Able to use Dyspnea scale to guide intensity level when exercising independently      Knowledge and understanding of Target Heart Rate Range (THRR) Yes Yes      Intervention Provide education and explanation of THRR including how the numbers were predicted and where they are located for reference Provide education and explanation of THRR including how the numbers were predicted and where  they are located for reference      Expected Outcomes Short Term: Able to state/look up THRR;Long Term: Able to use THRR to govern intensity when exercising independently;Short Term: Able to use daily as guideline for intensity in rehab Short Term: Able to state/look up THRR;Long Term: Able to use THRR to govern intensity when exercising independently;Short Term: Able to use daily as guideline for intensity in rehab      Able to check pulse independently Yes Yes      Intervention Provide education and demonstration on how to check pulse in carotid and radial arteries.;Review the importance of being able to check your own pulse for safety during  independent exercise Provide education and demonstration on how to check pulse in carotid and radial arteries.;Review the importance of being able to check your own pulse for safety during independent exercise      Expected Outcomes Short Term: Able to explain why pulse checking is important during independent exercise;Long Term: Able to check pulse independently and accurately Short Term: Able to explain why pulse checking is important during independent exercise;Long Term: Able to check pulse independently and accurately      Understanding of Exercise Prescription Yes Yes      Intervention Provide education, explanation, and written materials on patient's individual exercise prescription Provide education, explanation, and written materials on patient's individual exercise prescription      Expected Outcomes Short Term: Able to explain program exercise prescription;Long Term: Able to explain home exercise prescription to exercise independently Short Term: Able to explain program exercise prescription;Long Term: Able to explain home exercise prescription to exercise independently               Exercise Goals Re-Evaluation :  Exercise Goals Re-Evaluation     Row Name 12/13/20 0962 12/18/20 1413 12/22/20 1024 01/02/21 1242 01/05/21 0925     Exercise Goal Re-Evaluation   Exercise Goals Review Increase Physical Activity;Able to understand and use rate of perceived exertion (RPE) scale;Knowledge and understanding of Target Heart Rate Range (THRR);Understanding of Exercise Prescription;Increase Strength and Stamina;Able to understand and use Dyspnea scale;Able to check pulse independently Increase Physical Activity;Increase Strength and Stamina;Understanding of Exercise Prescription Increase Physical Activity;Increase Strength and Stamina;Understanding of Exercise Prescription Increase Physical Activity;Increase Strength and Stamina Increase Physical Activity;Increase Strength and Stamina;Understanding  of Exercise Prescription   Comments Reviewed RPE and dyspnea scales, THR and program prescription with pt today.  Pt voiced understanding and was given a copy of goals to take home. Willie Clark is off to a good start returning to rehab.  He has completed his first three full days already thus far.  He is already up to level 7 on the bike and level 6 on the T5 NuStep.  We will continue to monitor his progress. Updated home exercise with pt today.  Pt plans to walk and use treadmill at home for exercise.  We also talked about using staff videos for home use.  Reviewed THR, pulse, RPE, sign and symptoms, pulse oximetery and when to call 911 or MD.  Also discussed weather considerations and indoor options.  Pt voiced understanding. Willie Clark continues to do well with exercise.  He has increased levels on seated machines.  Staff will encourage increasing Tm also. Willie Clark is doing well in rehab.  His diverticulitis is flared up currently, reducing his ability to exert.  He is trying to stay active at home.  He is starting to regain his strength and his wife keeps him busy.   Expected  Outcomes Short: Use RPE daily to regulate intensity. Long: Follow program prescription in THR. Short: Continue to attend rehab regularly Long: Continue to follow program prescription Short: Continue to use treadmill on off days Long: Continue to improve stamina. Short: increase levels on TM Long:increase MET level Short: Get back to pushing again after flare up Long: COntinue to improve stamina    Row Name 01/16/21 0808 01/29/21 1448 02/05/21 0931 02/12/21 1112 02/23/21 0926     Exercise Goal Re-Evaluation   Exercise Goals Review Increase Physical Activity;Increase Strength and Stamina;Understanding of Exercise Prescription Increase Physical Activity;Increase Strength and Stamina Increase Physical Activity;Increase Strength and Stamina Increase Physical Activity;Increase Strength and Stamina Increase Physical Activity;Increase Strength and  Stamina;Understanding of Exercise Prescription   Comments Willie Clark is doing well in rehab.  He is now doing level 10 on the bike and level 7 on the T5.  We will continue to monitor his progress. Willie Clark attends consistently and works in correct RPE.  He is up to 43 laps on the track!  Staff will encourage trying 5 lb for strength. Willie Clark is consistently coming to rehab 3 times per week. He feels like his SOB was getting better but is not coming back and that is his main limiting factor. He made some progress on levels on machines but feels like he has leveled off due to his SOB limiting him. 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 Willie Clark is continuing to do well in rehab. He has reached a highest number of 44 laps on the track, to equal a little over 1 mile. He should be encouraged to increase to 5 lbs for his handweights. Will continue to monitor. Willie Clark is nearing graduation.  He should improve on his post 6MWT test next week.  He is planning to continue to walk with his dog after graduation.  He is feeling better overall.   Expected Outcomes Short: Continue to push up on workloads Long: Continue to improve strength Short: continue to attend consistently Long: continue to build stamina Short: continue to attend consistently Long: continue to build stamina and improve SOB Short: Increase to 5 lbs handweights Long: Continue to increase overall MET level and strength Short: Improve post 6MWT Long: Continue to improve stamina    Row Name 02/26/21 1344             Exercise Goal Re-Evaluation   Exercise Goals Review Increase Physical Activity;Increase Strength and Stamina       Comments Willie Clark did improve walk test by 23% ! He will complete the program in 4 more sessions.       Expected Outcomes Short:complete HT Long:maintain exercise on his own                Discharge Exercise Prescription (Final Exercise Prescription Changes):   Exercise Prescription Changes - 05/23/21 1400       Response to Exercise   Blood Pressure (Admit) 136/76    Blood Pressure (Exercise) 146/72    Blood Pressure (Exit) 112/60    Heart Rate (Admit) 60 bpm    Heart Rate (Exercise) 84 bpm    Heart Rate (Exit) 63 bpm    Oxygen Saturation (Admit) 94 %    Oxygen Saturation (Exercise) 93 %    Oxygen Saturation (Exit) 98 %    Rating of Perceived Exertion (Exercise) 13    Perceived Dyspnea (Exercise) 2    Symptoms SOB  Comments walk test results             Nutrition:  Target Goals: Understanding of nutrition guidelines, daily intake of sodium <154m, cholesterol <20100m calories 30% from fat and 7% or less from saturated fats, daily to have 5 or more servings of fruits and vegetables.  Education: All About Nutrition: -Group instruction provided by verbal, written material, interactive activities, discussions, models, and posters to present general guidelines for heart healthy nutrition including fat, fiber, MyPlate, the role of sodium in heart healthy nutrition, utilization of the nutrition label, and utilization of this knowledge for meal planning. Follow up email sent as well. Written material given at graduation. [facility]  Education need identified 12/12/20       Biometrics:  Pre Biometrics - 05/23/21 1447       Pre Biometrics   Height 5' 10"  (1.778 m)    Weight 207 lb 11.2 oz (94.2 kg)    BMI (Calculated) 29.8    Single Leg Stand 21.3 seconds             Post Biometrics - 02/26/21 0935        Post  Biometrics   Single Leg Stand 25.53 seconds             Nutrition Therapy Plan and Nutrition Goals:  Nutrition Therapy & Goals - 05/23/21 1448       Intervention Plan   Intervention Prescribe, educate and counsel regarding individualized specific dietary modifications aiming towards targeted core components such as weight, hypertension,  lipid management, diabetes, heart failure and other comorbidities.    Expected Outcomes Short Term Goal: Understand basic principles of dietary content, such as calories, fat, sodium, cholesterol and nutrients.;Short Term Goal: A plan has been developed with personal nutrition goals set during dietitian appointment.;Long Term Goal: Adherence to prescribed nutrition plan.             Nutrition Assessments:  MEDIFICTS Score Key: ?70 Need to make dietary changes  40-70 Heart Healthy Diet ? 40 Therapeutic Level Cholesterol Diet  [facility]  Picture Your Plate Total Score on Admission 54      Picture Your Plate Scores: <4<48nhealthy dietary pattern with much room for improvement. 41-50 Dietary pattern unlikely to meet recommendations for good health and room for improvement. 51-60 More healthful dietary pattern, with some room for improvement.  >60 Healthy dietary pattern, although there may be some specific behaviors that could be improved.   Nutrition Goals Re-Evaluation:  Nutrition Goals Re-Evaluation     RoKennerdellame 02/05/21 0941 02/23/21 0928           Goals   Nutrition Goal Lose weight Short:  eat balanced amount of carbs fat and protein as recommended by RD Long:  manage weight on his own      Comment DaShanon Broweports that his weight has been steady. He continues to try to eat a balance diet. He does not use much dressing when eating salad and continues to watch his sodium intake. DaShanon Brows doing well with his diet.   He is aiming for more balance but does cheat on occasion.  He is trying to get enough protein.  He says that if it taste good you shouldn't eat it.  He continue to aim for heart healthy.      Expected Outcome Short:  eat balanced amount of carbs fat  and protein as recommended by RD Long:  manage weight on his own Continue to focus on heart healthy eating               Nutrition Goals Discharge  (Final Nutrition Goals Re-Evaluation):  Nutrition Goals Re-Evaluation - 02/23/21 0928       Goals   Nutrition Goal Short:  eat balanced amount of carbs fat and protein as recommended by RD Long:  manage weight on his own    Comment Willie Clark is doing well with his diet.   He is aiming for more balance but does cheat on occasion.  He is trying to get enough protein.  He says that if it taste good you shouldn't eat it.  He continue to aim for heart healthy.    Expected Outcome Continue to focus on heart healthy eating             Psychosocial: Target Goals: Acknowledge presence or absence of significant depression and/or stress, maximize coping skills, provide positive support system. Participant is able to verbalize types and ability to use techniques and skills needed for reducing stress and depression.   Education: Stress, Anxiety, and Depression - Group verbal and visual presentation to define topics covered.  Reviews how body is impacted by stress, anxiety, and depression.  Also discusses healthy ways to reduce stress and to treat/manage anxiety and depression.  Written material given at graduation. Flowsheet Row Cardiac Rehab from 01/17/2021 in [facility] Cardiac and Pulmonary Rehab  Education need identified 12/12/20       Education: Sleep Hygiene -Provides group verbal and written instruction about how sleep can affect your health.  Define sleep hygiene, discuss sleep cycles and impact of sleep habits. Review good sleep hygiene tips.    Initial Review & Psychosocial Screening:  Initial Psych Review & Screening - 12/12/20 0748       Initial Review   Current issues with Current Stress Concerns;Current Psychotropic Meds;Current Sleep Concerns;History of Depression    Source of Stress Concerns Unable to participate in former interests or hobbies;Unable to perform yard/household activities;Family    Comments Knee pain, back pain, shortness of breath are limiting; brother's health (just got  LVAD), still lack of sleep, on [place]? Yes   brother, wife     Barriers   Psychosocial barriers to participate in program The patient should benefit from training in stress management and relaxation.;Psychosocial barriers identified (see note)      Screening Interventions   Interventions Encouraged to exercise;To provide support and resources with identified psychosocial needs;Provide feedback about the scores to participant    Expected Outcomes Short Term goal: Utilizing psychosocial counselor, staff and physician to assist with identification of specific Stressors or current issues interfering with healing process. Setting desired goal for each stressor or current issue identified.;Long Term Goal: Stressors or current issues are controlled or eliminated.;Short Term goal: Identification and review with participant of any Quality of Life or Depression concerns found by scoring the questionnaire.;Long Term goal: The participant improves quality of Life and PHQ9 Scores as seen by post scores and/or verbalization of changes             Quality of Life Scores:  Quality of Life - 02/28/21 0937       Quality of Life Scores   Health/Function Pre 16.67 %    Health/Function Post 19.76 %    Health/Function % Change 18.54 %    Socioeconomic  Pre 22.13 %    Socioeconomic Post 20.44 %    Socioeconomic % Change  -7.64 %    Psych/Spiritual Pre 23.36 %    Psych/Spiritual Post 23.07 %    Psych/Spiritual % Change -1.24 %    Family Pre 20.2 %    Family Post 16 %    Family % Change -20.79 %    GLOBAL Pre 19.45 %    GLOBAL Post 20.03 %    GLOBAL % Change 2.98 %            Scores of 19 and below usually indicate a poorer quality of life in these areas.  A difference of  2-3 points is a clinically meaningful difference.  A difference of 2-3 points in the total score of the Quality of Life Index has been associated with significant improvement in overall  quality of life, self-image, physical symptoms, and general health in studies assessing change in quality of life.  PHQ-9: Recent Review Flowsheet Data     Depression screen [facility] 2/9 05/23/2021 02/28/2021 12/12/2020 08/16/2020 07/12/2020   Decreased Interest 1 2 2  0 3   Down, Depressed, Hopeless 2 0 1 0 1   PHQ - 2 Score 3 2 3  0 4   Altered sleeping 3 2 3 3 1    Tired, decreased energy 3 2 3 3 2    Change in appetite 0 1 0 1 0   Feeling bad or failure about yourself  0 0 1 0 0   Trouble concentrating 1 0 1 3 0   Moving slowly or fidgety/restless 0 1 1 0 1   Suicidal thoughts 0 0 0 0 0   PHQ-9 Score 10 8 12 10 8    Difficult doing work/chores Somewhat difficult Not difficult at all Somewhat difficult Somewhat difficult Somewhat difficult      Interpretation of Total Score  Total Score Depression Severity:  1-4 = Minimal depression, 5-9 = Mild depression, 10-14 = Moderate depression, 15-19 = Moderately severe depression, 20-27 = Severe depression   Psychosocial Evaluation and Intervention:  Psychosocial Evaluation - 05/08/21 0942       Psychosocial Evaluation & Interventions   Interventions Encouraged to exercise with the program and follow exercise prescription;Relaxation education    Comments Willie Clark has no barriers to attending the program. He has been through [facility] several times and is entering Pulmonary Rehab for Heart Failure. He is well versed in his Heart Failure routine of watching his weight and taking extra diurectic if his weight is over 2 lb change . He continues with Celexa and has no cnverns about depression today. He has stress in he continues to worry about his brother. His brother recently had an LVAD placement. Willie Clark encouraged his brother to enroll in Cardiac Rehab and Willie Clark got a Pulmonary Rehab referral so he can be in class with his brother.  Willie Clark continues with his SOB symptoms and is hoping he can decrease the symptome as he progresses in the program.    Expected  Outcomes STG: Attend all scheduled sessions. Progress with exercise prescription to aid with stress control and decrease SOB. LTG Willie Clark is able to continue his progress after discharge    Continue Psychosocial Services  Follow up required by staff             Psychosocial Re-Evaluation:  Psychosocial Re-Evaluation     Dane Name 01/05/21 0926 02/05/21 0936 02/23/21 0927         Psychosocial Re-Evaluation   Current  issues with Current Sleep Concerns;Current Stress Concerns Current Sleep Concerns;Current Stress Concerns Current Sleep Concerns     Comments Willie Clark continues to struggle with sleep. This past week, his diverticulitis has been keeping him up at night.  His wife keeps him busy and active. He denies any other major stressor as he tries not to let things get to him as much.  He realizes that he can't do anything about it.  He is hurting, but he cannot get injections due to his blood thinner until July. Willie Clark reports no new stress or sleep concerns. He does not sleep well but this is a chronic issue. He reports still having a good support system and trying to have a positive outlook on life. Willie Clark is doing well in rehab. He continues to deal with his chronic stress issues. He has enjoyed meeting people and getting out and about.     Expected Outcomes Short: Stay acitive for mental boost Long: continue to work on sleep. Short: Stay acitive for mental boost Long: continue to work on sleep. Continue to stay active for mental boost     Interventions Encouraged to attend Cardiac Rehabilitation for the exercise Encouraged to attend Cardiac Rehabilitation for the exercise --     Continue Psychosocial Services  -- Follow up required by staff --              Psychosocial Discharge (Final Psychosocial Re-Evaluation):  Psychosocial Re-Evaluation - 02/23/21 0927       Psychosocial Re-Evaluation   Current issues with Current Sleep Concerns    Comments Willie Clark is doing well in rehab. He  continues to deal with his chronic stress issues. He has enjoyed meeting people and getting out and about.    Expected Outcomes Continue to stay active for mental boost             Education: Education Goals: Education classes will be provided on a weekly basis, covering required topics. Participant will state understanding/return demonstration of topics presented.  Learning Barriers/Preferences:  Learning Barriers/Preferences - 12/12/20 0752       Learning Barriers/Preferences   Learning Barriers None    Learning Preferences None             General Pulmonary Education Topics:  Infection Prevention: - Provides verbal and written material to individual with discussion of infection control including proper hand washing and proper equipment cleaning during exercise session. Flowsheet Row Pulmonary Rehab from 05/23/2021 in [facility] Cardiac and Pulmonary Rehab  Date 05/23/21  Educator [facility]  Instruction Review Code 1- Verbalizes Understanding       Falls Prevention: - Provides verbal and written material to individual with discussion of falls prevention and safety. Flowsheet Row Pulmonary Rehab from 05/23/2021 in [facility] Cardiac and Pulmonary Rehab  Date 05/23/21  Educator [facility]  Instruction Review Code 1- Verbalizes Understanding       Chronic Lung Disease Review: - Group verbal instruction with posters, models, PowerPoint presentations and videos,  to review new updates, new respiratory medications, new advancements in procedures and treatments. Providing information on websites and "800" numbers for continued self-education. Includes information about supplement oxygen, available portable oxygen systems, continuous and intermittent flow rates, oxygen safety, concentrators, and Medicare reimbursement for oxygen. Explanation of Pulmonary Drugs, including class, frequency, complications, importance of spacers, rinsing mouth after steroid MDI's, and proper cleaning methods for nebulizers.  Review of basic lung anatomy and physiology related to function, structure, and complications of lung disease. Review of risk factors. Discussion about methods for  diagnosing sleep apnea and types of masks and machines for OSA. Includes a review of the use of types of environmental controls: home humidity, furnaces, filters, dust mite/pet prevention, HEPA vacuums. Discussion about weather changes, air quality and the benefits of nasal washing. Instruction on Warning signs, infection symptoms, calling MD promptly, preventive modes, and value of vaccinations. Review of effective airway clearance, coughing and/or vibration techniques. Emphasizing that all should Create an Action Plan. Written material given at graduation. Flowsheet Row Pulmonary Rehab from 05/23/2021 in [facility] Cardiac and Pulmonary Rehab  Education need identified 05/23/21       AED/CPR: - Group verbal and written instruction with the use of models to demonstrate the basic use of the AED with the basic ABC's of resuscitation.    Anatomy and Cardiac Procedures: - Group verbal and visual presentation and models provide information about basic cardiac anatomy and function. Reviews the testing methods done to diagnose heart disease and the outcomes of the test results. Describes the treatment choices: Medical Management, Angioplasty, or Coronary Bypass Surgery for treating various heart conditions including Myocardial Infarction, Angina, Valve Disease, and Cardiac Arrhythmias.  Written material given at graduation. Flowsheet Row Cardiac Rehab from 09/13/2020 in [facility] Cardiac and Pulmonary Rehab  Education need identified 07/12/20  Date 08/16/20  Educator SB  Instruction Review Code 1- Verbalizes Understanding       Medication Safety: - Group verbal and visual instruction to review commonly prescribed medications for heart and lung disease. Reviews the medication, class of the drug, and side effects. Includes the steps to properly store  meds and maintain the prescription regimen.  Written material given at graduation. Flowsheet Row Cardiac Rehab from 01/17/2021 in [facility] Cardiac and Pulmonary Rehab  Date 01/03/21  Educator SB  Instruction Review Code 1- Verbalizes Understanding       Other: -Provides group and verbal instruction on various topics (see comments)   Knowledge Questionnaire Score:  Knowledge Questionnaire Score - 05/23/21 1448       Knowledge Questionnaire Score   Pre Score 14/18              Core Components/Risk Factors/Patient Goals at Admission:  Personal Goals and Risk Factors at Admission - 05/23/21 1449       Core Components/Risk Factors/Patient Goals on Admission    Weight Management Yes;Weight Loss    Intervention Weight Management: Develop a combined nutrition and exercise program designed to reach desired caloric intake, while maintaining appropriate intake of nutrient and fiber, sodium and fats, and appropriate energy expenditure required for the weight goal.;Weight Management: Provide education and appropriate resources to help participant work on and attain dietary goals.;Weight Management/Obesity: Establish reasonable short term and long term weight goals.    Admit Weight 207 lb 11.2 oz (94.2 kg)    Goal Weight: Short Term 200 lb (90.7 kg)    Goal Weight: Long Term 190 lb (86.2 kg)    Expected Outcomes Short Term: Continue to assess and modify interventions until short term weight is achieved;Long Term: Adherence to nutrition and physical activity/exercise program aimed toward attainment of established weight goal;Weight Loss: Understanding of general recommendations for a balanced deficit meal plan, which promotes 1-2 lb weight loss per week and includes a negative energy balance of [phone] kcal/d;Understanding recommendations for meals to include 15-35% energy as protein, 25-35% energy from fat, 35-60% energy from carbohydrates, less than 291m of dietary cholesterol, 20-35 gm of total  fiber daily;Understanding of distribution of calorie intake throughout the day with the consumption of  4-5 meals/snacks    Improve shortness of breath with ADL's Yes    Intervention Provide education, individualized exercise plan and daily activity instruction to help decrease symptoms of SOB with activities of daily living.    Expected Outcomes Short Term: Improve cardiorespiratory fitness to achieve a reduction of symptoms when performing ADLs;Long Term: Be able to perform more ADLs without symptoms or delay the onset of symptoms    Increase knowledge of respiratory medications and ability to use respiratory devices properly  Yes    Intervention Provide education and demonstration as needed of appropriate use of medications, inhalers, and oxygen therapy.    Expected Outcomes Short Term: Achieves understanding of medications use. Understands that oxygen is a medication prescribed by physician. Demonstrates appropriate use of inhaler and oxygen therapy.;Long Term: Maintain appropriate use of medications, inhalers, and oxygen therapy.    Heart Failure Yes    Intervention Provide a combined exercise and nutrition program that is supplemented with education, support and counseling about heart failure. Directed toward relieving symptoms such as shortness of breath, decreased exercise tolerance, and extremity edema.    Expected Outcomes Improve functional capacity of life;Short term: Attendance in program 2-3 days a week with increased exercise capacity. Reported lower sodium intake. Reported increased fruit and vegetable intake. Reports medication compliance.;Short term: Daily weights obtained and reported for increase. Utilizing diuretic protocols set by physician.;Long term: Adoption of self-care skills and reduction of barriers for early signs and symptoms recognition and intervention leading to self-care maintenance.    Hypertension Yes    Intervention Provide education on lifestyle modifcations including  regular physical activity/exercise, weight management, moderate sodium restriction and increased consumption of fresh fruit, vegetables, and low fat dairy, alcohol moderation, and smoking cessation.;Monitor prescription use compliance.    Expected Outcomes Short Term: Continued assessment and intervention until BP is < 140/20m HG in hypertensive participants. < 130/859mHG in hypertensive participants with diabetes, heart failure or chronic kidney disease.;Long Term: Maintenance of blood pressure at goal levels.    Lipids Yes    Intervention Provide education and support for participant on nutrition & aerobic/resistive exercise along with prescribed medications to achieve LDL <7086mHDL >67m79m  Expected Outcomes Short Term: Participant states understanding of desired cholesterol values and is compliant with medications prescribed. Participant is following exercise prescription and nutrition guidelines.;Long Term: Cholesterol controlled with medications as prescribed, with individualized exercise RX and with personalized nutrition plan. Value goals: LDL < 70mg45mL > 40 mg.             Education:Diabetes - Individual verbal and written instruction to review signs/symptoms of diabetes, desired ranges of glucose level fasting, after meals and with exercise. Acknowledge that pre and post exercise glucose checks will be done for 3 sessions at entry of program.   Know Your Numbers and Heart Failure: - Group verbal and visual instruction to discuss disease risk factors for cardiac and pulmonary disease and treatment options.  Reviews associated critical values for Overweight/Obesity, Hypertension, Cholesterol, and Diabetes.  Discusses basics of heart failure: signs/symptoms and treatments.  Introduces Heart Failure Zone chart for action plan for heart failure.  Written material given at graduation. Flowsheet [facility]  Date 01/17/21  Educator  KB  Instruction Review Code 1- Verbalizes Understanding       Core Components/Risk Factors/Patient Goals Review:   Goals and Risk Factor Review     Row Name 01/05/21 0930 02/05/21 0937 [phone] [id number]  Core Components/Risk Factors/Patient Goals Review   Personal Goals Review Weight Management/Obesity;Heart Failure;Hypertension;Lipids Weight Management/Obesity;Heart Failure;Hypertension;Lipids Weight Management/Obesity;Heart Failure;Hypertension;Lipids     Review Willie Clark is doing well in rehab. His weight fluctuates some every day. He weighs daily in the morning and takes his meds.  His pressures have been in the 140s/50s recently.  He does still have some swelling in stomach, legs and feet.  He did take some extra lasix to help and it is better today. Patient reports taking all medications as prescirbed. He weighs daily and has a prodocol set up with his doctor to take an extra fluid pill if weight is up more then 2 pounds. His blood pressure and lipids are reported to be under control. Willie Clark has done well in rehab. His weight continues to trend down and hopes to keep it that way.  He has not had any heart failure symptoms.  His pressures are doing well and he continues to check them.  He is scheduled for a CT scan for his stomach for his diverticulitis check in.     Expected Outcomes Short: Continue to keep close eye on weight Long: Continue to manage heart failure. Short: continue to attend cardiac consistently and keep an eye on weight. Long: continue to manage heart failure. Continue to montior risk factors and manage heart failure.              Core Components/Risk Factors/Patient Goals at Discharge (Final Review):   Goals and Risk Factor Review - 02/23/21 0931       Core Components/Risk Factors/Patient Goals Review   Personal Goals Review Weight Management/Obesity;Heart Failure;Hypertension;Lipids    Review Willie Clark has done well in rehab. His weight continues to trend down and  hopes to keep it that way.  He has not had any heart failure symptoms.  His pressures are doing well and he continues to check them.  He is scheduled for a CT scan for his stomach for his diverticulitis check in.    Expected Outcomes Continue to montior risk factors and manage heart failure.             ITP Comments:  ITP Comments     Row Name 12/12/20 [phone] 12/13/20 0926 03/09/21 0914 05/08/21 0948 05/23/21 1442   ITP Comments Updated and reviewed medical record for new stent on 11/15/20.  Documentation for diagnosis can be found in [facility] note from 11/15/20.  Completed 6MWT and gym orientation. Initial ITP created and sent for review to Dr. Emily Filbert, Medical Director. First full day of exercise!  Patient was oriented to gym and equipment including functions, settings, policies, and procedures.  Patient's individual exercise prescription and treatment plan were reviewed.  All starting workloads were established based on the results of the 6 minute walk test done at initial orientation visit.  The plan for exercise progression was also introduced and progression will be customized based on patient's performance and goals. Paton graduated today from  rehab with 36 sessions completed.  Details of the patient's exercise prescription and what He needs to do in order to continue the prescription and progress were discussed with patient.  Patient was given a copy of prescription and goals.  Patient verbalized understanding.  Oren plans to continue to exercise by walking and using treadmill. Virtual orientation call completed today. he has an appointment on Date: 05/23/2021  for EP eval and gym Orientation.  Documentation of diagnosis can be found in [facility]  Date: 03/13/2021 .   Willie Clark is newly enrolled  in the Pulmonary Rehab program. Completed 6MWT and gym orientation. Initial ITP created and sent for review to Dr. Zetta Bills, Medical Director.            Comments: Initial ITP

## 2021-05-25 ENCOUNTER — Encounter: Payer: Medicare HMO | Admitting: *Deleted

## 2021-05-25 ENCOUNTER — Other Ambulatory Visit: Payer: Self-pay

## 2021-05-25 DIAGNOSIS — I5022 Chronic systolic (congestive) heart failure: Secondary | ICD-10-CM | POA: Diagnosis not present

## 2021-05-25 NOTE — Progress Notes (Signed)
Daily Session Note  Patient Details  Name: Willie Clark MRN: [id number] Date of Birth: 1950/10/25 Referring Provider:   Flowsheet Row Pulmonary Rehab from 05/23/2021 in [facility] Cardiac and Pulmonary Rehab  Referring Provider [name] MD       Encounter Date: 05/25/2021  Check In:  Session Check In - 05/25/21 0937       Check-In   Supervising physician immediately available to respond to emergencies See telemetry face sheet for immediately available ER MD    Location [facility]-Cardiac & Pulmonary Rehab    Staff Present Renita Papa, RN BSN;Joseph [place], RCP,RRT,BSRT;Jessica Seligman, [place], RCEP, CCRP, CCET    Virtual Visit No    Medication changes reported     No    Fall or balance concerns reported    No    Warm-up and Cool-down Performed on first and last piece of equipment    Resistance Training Performed Yes    VAD Patient? No    PAD/SET Patient? No      Pain Assessment   Currently in Pain? No/denies                Social History   Tobacco Use  Smoking Status Former   Packs/day: 2.50   Types: Cigarettes   Quit date: 08/21/1991   Years since quitting: 29.7  Smokeless Tobacco Never    Goals Met:  Independence with exercise equipment Exercise tolerated well No report of concerns or symptoms today Strength training completed today  Goals Unmet:  Not Applicable  Comments: First full day of exercise!  Patient was oriented to gym and equipment including functions, settings, policies, and procedures.  Patient's individual exercise prescription and treatment plan were reviewed.  All starting workloads were established based on the results of the 6 minute walk test done at initial orientation visit.  The plan for exercise progression was also introduced and progression will be customized based on patient's performance and goals.     Dr. Emily Filbert is Medical Director for [place].  Dr. Ottie Glazier is Medical Director for [facility]  Pulmonary Rehabilitation.

## 2021-05-28 ENCOUNTER — Encounter: Payer: Medicare HMO | Admitting: *Deleted

## 2021-05-28 ENCOUNTER — Other Ambulatory Visit: Payer: Self-pay

## 2021-05-28 DIAGNOSIS — I5022 Chronic systolic (congestive) heart failure: Secondary | ICD-10-CM

## 2021-05-28 NOTE — Progress Notes (Signed)
Daily Session Note  Patient Details  Name: Willie Clark MRN: [id number] Date of Birth: November 29, 1950 Referring Provider:   April Manson Pulmonary Rehab from 05/23/2021 in [facility] Cardiac and Pulmonary Rehab  Referring Provider [name] MD       Encounter Date: 05/28/2021  Check In:  Session Check In - 05/28/21 0932       Check-In   Supervising physician immediately available to respond to emergencies See telemetry face sheet for immediately available ER MD    Location [facility]-Cardiac & Pulmonary Rehab    Staff Present Earlean Shawl, BS, ACSM CEP, Exercise Physiologist;Amanda Oletta Darter, BA, ACSM CEP, Exercise Physiologist;Joseph [place], [place];Heath Lark, RN, BSN, CCRP    Virtual Visit No    Medication changes reported     No    Fall or balance concerns reported    No    Warm-up and Cool-down Performed on first and last piece of equipment    Resistance Training Performed Yes    VAD Patient? No    PAD/SET Patient? No      Pain Assessment   Currently in Pain? No/denies                Social History   Tobacco Use  Smoking Status Former   Packs/day: 2.50   Types: Cigarettes   Quit date: 08/21/1991   Years since quitting: 29.7  Smokeless Tobacco Never    Goals Met:  Proper associated with RPD/PD & O2 Sat Independence with exercise equipment Exercise tolerated well No report of concerns or symptoms today  Goals Unmet:  Not Applicable  Comments: Pt able to follow exercise prescription today without complaint.  Will continue to monitor for progression.    Dr. Emily Filbert is Medical Director for [place].  Dr. Ottie Glazier is Medical Director for [facility] Pulmonary Rehabilitation.

## 2021-05-30 ENCOUNTER — Other Ambulatory Visit: Payer: Self-pay | Admitting: Cardiovascular Disease

## 2021-05-30 ENCOUNTER — Other Ambulatory Visit: Payer: Self-pay

## 2021-05-30 DIAGNOSIS — I5022 Chronic systolic (congestive) heart failure: Secondary | ICD-10-CM

## 2021-05-30 DIAGNOSIS — K5792 Diverticulitis of intestine, part unspecified, without perforation or abscess without bleeding: Secondary | ICD-10-CM | POA: Diagnosis not present

## 2021-05-30 DIAGNOSIS — A401 Sepsis due to streptococcus, group B: Secondary | ICD-10-CM | POA: Diagnosis not present

## 2021-05-30 NOTE — Progress Notes (Signed)
Daily Session Note  Patient Details  Name: Willie Clark MRN: [id number] Date of Birth: 1950/05/07 Referring Provider:   Flowsheet Row Pulmonary Rehab from 05/23/2021 in [facility] Cardiac and Pulmonary Rehab  Referring Provider [name] MD       Encounter Date: 05/30/2021  Check In:  Session Check In - 05/30/21 0924       Check-In   Supervising physician immediately available to respond to emergencies See telemetry face sheet for immediately available ER MD    Location [facility]-Cardiac & Pulmonary Rehab    Staff Present Birdie Sons, MPA, RN;Amanda Oletta Darter, BA, ACSM CEP, Exercise Physiologist;Melissa Caiola, RDN, LDN    Virtual Visit No    Medication changes reported     No    Tobacco Cessation No Change    Warm-up and Cool-down Performed on first and last piece of equipment    Resistance Training Performed Yes    VAD Patient? No    PAD/SET Patient? No      Pain Assessment   Currently in Pain? No/denies                Social History   Tobacco Use  Smoking Status Former   Packs/day: 2.50   Types: Cigarettes   Quit date: 08/21/1991   Years since quitting: 29.7  Smokeless Tobacco Never    Goals Met:  Independence with exercise equipment Exercise tolerated well No report of concerns or symptoms today Strength training completed today  Goals Unmet:  Not Applicable  Comments: Pt able to follow exercise prescription today without complaint.  Will continue to monitor for progression.    Dr. Emily Filbert is Medical Director for [place].  Dr. Ottie Glazier is Medical Director for [facility] Pulmonary Rehabilitation.

## 2021-06-01 ENCOUNTER — Emergency Department: Payer: Medicare HMO

## 2021-06-01 ENCOUNTER — Other Ambulatory Visit: Payer: Self-pay

## 2021-06-01 ENCOUNTER — Encounter: Payer: Medicare HMO | Admitting: *Deleted

## 2021-06-01 ENCOUNTER — Inpatient Hospital Stay
Admission: EM | Admit: 2021-06-01 | Discharge: 2021-06-05 | DRG: 871 | Disposition: A | Discharge status: Home / Self Care | Payer: Medicare HMO | Attending: Family Medicine | Admitting: Family Medicine

## 2021-06-01 DIAGNOSIS — I248 Other forms of acute ischemic heart disease: Secondary | ICD-10-CM | POA: Diagnosis present

## 2021-06-01 DIAGNOSIS — I25810 Atherosclerosis of coronary artery bypass graft(s) without angina pectoris: Secondary | ICD-10-CM | POA: Diagnosis present

## 2021-06-01 DIAGNOSIS — I10 Essential (primary) hypertension: Secondary | ICD-10-CM | POA: Diagnosis present

## 2021-06-01 DIAGNOSIS — Z955 Presence of coronary angioplasty implant and graft: Secondary | ICD-10-CM

## 2021-06-01 DIAGNOSIS — A419 Sepsis, unspecified organism: Principal | ICD-10-CM | POA: Diagnosis present

## 2021-06-01 DIAGNOSIS — I714 Abdominal aortic aneurysm, without rupture, unspecified: Secondary | ICD-10-CM | POA: Diagnosis present

## 2021-06-01 DIAGNOSIS — M961 Postlaminectomy syndrome, not elsewhere classified: Secondary | ICD-10-CM | POA: Diagnosis present

## 2021-06-01 DIAGNOSIS — I110 Hypertensive heart disease with heart failure: Secondary | ICD-10-CM | POA: Diagnosis present

## 2021-06-01 DIAGNOSIS — R079 Chest pain, unspecified: Secondary | ICD-10-CM | POA: Diagnosis not present

## 2021-06-01 DIAGNOSIS — Z7982 Long term (current) use of aspirin: Secondary | ICD-10-CM

## 2021-06-01 DIAGNOSIS — I5020 Unspecified systolic (congestive) heart failure: Secondary | ICD-10-CM | POA: Diagnosis present

## 2021-06-01 DIAGNOSIS — K5732 Diverticulitis of large intestine without perforation or abscess without bleeding: Secondary | ICD-10-CM | POA: Diagnosis present

## 2021-06-01 DIAGNOSIS — K5792 Diverticulitis of intestine, part unspecified, without perforation or abscess without bleeding: Secondary | ICD-10-CM | POA: Diagnosis present

## 2021-06-01 DIAGNOSIS — Z87891 Personal history of nicotine dependence: Secondary | ICD-10-CM

## 2021-06-01 DIAGNOSIS — Z9989 Dependence on other enabling machines and devices: Secondary | ICD-10-CM

## 2021-06-01 DIAGNOSIS — M797 Fibromyalgia: Secondary | ICD-10-CM | POA: Diagnosis present

## 2021-06-01 DIAGNOSIS — I25119 Atherosclerotic heart disease of native coronary artery with unspecified angina pectoris: Secondary | ICD-10-CM | POA: Diagnosis not present

## 2021-06-01 DIAGNOSIS — Z8673 Personal history of transient ischemic attack (TIA), and cerebral infarction without residual deficits: Secondary | ICD-10-CM

## 2021-06-01 DIAGNOSIS — R0609 Other forms of dyspnea: Secondary | ICD-10-CM | POA: Diagnosis not present

## 2021-06-01 DIAGNOSIS — I214 Non-ST elevation (NSTEMI) myocardial infarction: Secondary | ICD-10-CM | POA: Diagnosis not present

## 2021-06-01 DIAGNOSIS — I2510 Atherosclerotic heart disease of native coronary artery without angina pectoris: Secondary | ICD-10-CM | POA: Diagnosis present

## 2021-06-01 DIAGNOSIS — Z20822 Contact with and (suspected) exposure to covid-19: Secondary | ICD-10-CM | POA: Diagnosis present

## 2021-06-01 DIAGNOSIS — I5022 Chronic systolic (congestive) heart failure: Secondary | ICD-10-CM | POA: Diagnosis present

## 2021-06-01 DIAGNOSIS — Z8249 Family history of ischemic heart disease and other diseases of the circulatory system: Secondary | ICD-10-CM

## 2021-06-01 DIAGNOSIS — Z888 Allergy status to other drugs, medicaments and biological substances status: Secondary | ICD-10-CM | POA: Diagnosis not present

## 2021-06-01 DIAGNOSIS — R6520 Severe sepsis without septic shock: Secondary | ICD-10-CM | POA: Diagnosis present

## 2021-06-01 DIAGNOSIS — K219 Gastro-esophageal reflux disease without esophagitis: Secondary | ICD-10-CM | POA: Diagnosis present

## 2021-06-01 DIAGNOSIS — G9340 Encephalopathy, unspecified: Secondary | ICD-10-CM

## 2021-06-01 DIAGNOSIS — Z885 Allergy status to narcotic agent status: Secondary | ICD-10-CM

## 2021-06-01 DIAGNOSIS — I2489 Other forms of acute ischemic heart disease: Secondary | ICD-10-CM | POA: Diagnosis present

## 2021-06-01 DIAGNOSIS — R609 Edema, unspecified: Secondary | ICD-10-CM

## 2021-06-01 DIAGNOSIS — F32A Depression, unspecified: Secondary | ICD-10-CM | POA: Diagnosis present

## 2021-06-01 DIAGNOSIS — Z882 Allergy status to sulfonamides status: Secondary | ICD-10-CM

## 2021-06-01 DIAGNOSIS — A401 Sepsis due to streptococcus, group B: Principal | ICD-10-CM | POA: Diagnosis present

## 2021-06-01 DIAGNOSIS — R778 Other specified abnormalities of plasma proteins: Secondary | ICD-10-CM | POA: Diagnosis not present

## 2021-06-01 DIAGNOSIS — R109 Unspecified abdominal pain: Secondary | ICD-10-CM | POA: Diagnosis present

## 2021-06-01 DIAGNOSIS — N179 Acute kidney failure, unspecified: Secondary | ICD-10-CM | POA: Diagnosis present

## 2021-06-01 DIAGNOSIS — G9341 Metabolic encephalopathy: Secondary | ICD-10-CM | POA: Diagnosis present

## 2021-06-01 DIAGNOSIS — R54 Age-related physical debility: Secondary | ICD-10-CM | POA: Diagnosis present

## 2021-06-01 DIAGNOSIS — Z7902 Long term (current) use of antithrombotics/antiplatelets: Secondary | ICD-10-CM

## 2021-06-01 DIAGNOSIS — E785 Hyperlipidemia, unspecified: Secondary | ICD-10-CM | POA: Diagnosis present

## 2021-06-01 DIAGNOSIS — I5042 Chronic combined systolic (congestive) and diastolic (congestive) heart failure: Secondary | ICD-10-CM | POA: Diagnosis present

## 2021-06-01 DIAGNOSIS — Z86711 Personal history of pulmonary embolism: Secondary | ICD-10-CM

## 2021-06-01 DIAGNOSIS — B951 Streptococcus, group B, as the cause of diseases classified elsewhere: Secondary | ICD-10-CM

## 2021-06-01 DIAGNOSIS — Z79899 Other long term (current) drug therapy: Secondary | ICD-10-CM

## 2021-06-01 DIAGNOSIS — Z981 Arthrodesis status: Secondary | ICD-10-CM

## 2021-06-01 DIAGNOSIS — E871 Hypo-osmolality and hyponatremia: Secondary | ICD-10-CM | POA: Diagnosis not present

## 2021-06-01 DIAGNOSIS — R7881 Bacteremia: Secondary | ICD-10-CM | POA: Diagnosis not present

## 2021-06-01 DIAGNOSIS — G4733 Obstructive sleep apnea (adult) (pediatric): Secondary | ICD-10-CM | POA: Diagnosis present

## 2021-06-01 DIAGNOSIS — K2270 Barrett's esophagus without dysplasia: Secondary | ICD-10-CM | POA: Diagnosis present

## 2021-06-01 DIAGNOSIS — J449 Chronic obstructive pulmonary disease, unspecified: Secondary | ICD-10-CM | POA: Diagnosis present

## 2021-06-01 DIAGNOSIS — J189 Pneumonia, unspecified organism: Secondary | ICD-10-CM | POA: Diagnosis present

## 2021-06-01 DIAGNOSIS — Z951 Presence of aortocoronary bypass graft: Secondary | ICD-10-CM

## 2021-06-01 DIAGNOSIS — I21A1 Myocardial infarction type 2: Secondary | ICD-10-CM | POA: Diagnosis not present

## 2021-06-01 HISTORY — DX: Abnormal result of other cardiovascular function study: R94.39

## 2021-06-01 LAB — COMPREHENSIVE METABOLIC PANEL
ALT: 22 U/L (ref 0–44)
AST: 30 U/L (ref 15–41)
Albumin: 4.1 g/dL (ref 3.5–5.0)
Alkaline Phosphatase: 70 U/L (ref 38–126)
Anion gap: 12 (ref 5–15)
BUN: 11 mg/dL (ref 8–23)
CO2: 22 mmol/L (ref 22–32)
Calcium: 8.6 mg/dL — ABNORMAL LOW (ref 8.9–10.3)
Chloride: 99 mmol/L (ref 98–111)
Creatinine, Ser: 1.31 mg/dL — ABNORMAL HIGH (ref 0.61–1.24)
GFR, Estimated: 59 mL/min — ABNORMAL LOW (ref >60)
Glucose, Bld: 105 mg/dL — ABNORMAL HIGH (ref 70–99)
Potassium: 4.5 mmol/L (ref 3.5–5.1)
Sodium: 133 mmol/L — ABNORMAL LOW (ref 135–145)
Total Bilirubin: 1 mg/dL (ref 0.3–1.2)
Total Protein: 6.6 g/dL (ref 6.5–8.1)

## 2021-06-01 LAB — URINALYSIS, ROUTINE W REFLEX MICROSCOPIC
Bacteria, UA: NONE SEEN
Bilirubin Urine: NEGATIVE
Glucose, UA: >=500 mg/dL — AB
Hgb urine dipstick: NEGATIVE
Ketones, ur: NEGATIVE mg/dL
Leukocytes,Ua: NEGATIVE
Nitrite: NEGATIVE
Protein, ur: NEGATIVE mg/dL
Specific Gravity, Urine: 1.021 (ref 1.005–1.030)
pH: 5 (ref 5.0–8.0)

## 2021-06-01 LAB — LACTIC ACID, PLASMA
Lactic Acid, Venous: 2.8 mmol/L (ref 0.5–1.9)
Lactic Acid, Venous: 3 mmol/L (ref 0.5–1.9)
Lactic Acid, Venous: 2.2 mmol/L (ref 0.5–1.9)

## 2021-06-01 LAB — TROPONIN I (HIGH SENSITIVITY)
Troponin I (High Sensitivity): 14 ng/L (ref <18)
Troponin I (High Sensitivity): 139 ng/L (ref <18)

## 2021-06-01 LAB — PROCALCITONIN
Procalcitonin: 1.63 ng/mL
Procalcitonin: 8.05 ng/mL

## 2021-06-01 LAB — RESP PANEL BY RT-PCR (FLU A&B, COVID) ARPGX2
Influenza A by PCR: NEGATIVE
Influenza B by PCR: NEGATIVE
SARS Coronavirus 2 by RT PCR: NEGATIVE

## 2021-06-01 LAB — CBC WITH DIFFERENTIAL/PLATELET
Abs Immature Granulocytes: 0.05 10*3/uL (ref 0.00–0.07)
Basophils Absolute: 0 10*3/uL (ref 0.0–0.1)
Basophils Relative: 0 %
Eosinophils Absolute: 0.1 10*3/uL (ref 0.0–0.5)
Eosinophils Relative: 1 %
HCT: 42.7 % (ref 39.0–52.0)
Hemoglobin: 14.1 g/dL (ref 13.0–17.0)
Immature Granulocytes: 0 %
Lymphocytes Relative: 13 %
Lymphs Abs: 1.5 10*3/uL (ref 0.7–4.0)
MCH: 32.9 pg (ref 26.0–34.0)
MCHC: 33 g/dL (ref 30.0–36.0)
MCV: 99.8 fL (ref 80.0–100.0)
Monocytes Absolute: 0.5 10*3/uL (ref 0.1–1.0)
Monocytes Relative: 4 %
Neutro Abs: 9.1 10*3/uL — ABNORMAL HIGH (ref 1.7–7.7)
Neutrophils Relative %: 82 %
Platelets: 176 10*3/uL (ref 150–400)
RBC: 4.28 MIL/uL (ref 4.22–5.81)
RDW: 13.3 % (ref 11.5–15.5)
WBC: 11.3 10*3/uL — ABNORMAL HIGH (ref 4.0–10.5)
nRBC: 0 % (ref 0.0–0.2)

## 2021-06-01 LAB — BLOOD GAS, VENOUS
Acid-Base Excess: 6.3 mmol/L — ABNORMAL HIGH (ref 0.0–2.0)
Bicarbonate: 32.3 mmol/L — ABNORMAL HIGH (ref 20.0–28.0)
O2 Saturation: 71.3 %
Patient temperature: 37
pCO2, Ven: 51 mmHg (ref 44.0–60.0)
pH, Ven: 7.41 (ref 7.250–7.430)
pO2, Ven: 37 mmHg (ref 32.0–45.0)

## 2021-06-01 LAB — HIV ANTIBODY (ROUTINE TESTING W REFLEX): HIV Screen 4th Generation wRfx: NONREACTIVE

## 2021-06-01 LAB — AMMONIA: Ammonia: 15 umol/L (ref 9–35)

## 2021-06-01 LAB — LIPASE, BLOOD: Lipase: 42 U/L (ref 11–51)

## 2021-06-01 LAB — PROTIME-INR
INR: 0.9 (ref 0.8–1.2)
Prothrombin Time: 12 seconds (ref 11.4–15.2)

## 2021-06-01 LAB — CBG MONITORING, ED: Glucose-Capillary: 112 mg/dL — ABNORMAL HIGH (ref 70–99)

## 2021-06-01 LAB — BRAIN NATRIURETIC PEPTIDE: B Natriuretic Peptide: 90 pg/mL (ref 0.0–100.0)

## 2021-06-01 LAB — APTT: aPTT: 26 seconds (ref 24–36)

## 2021-06-01 MED ORDER — HEPARIN (PORCINE) 25000 UT/250ML-% IV SOLN
1600.0000 [IU]/h | INTRAVENOUS | Status: AC
  Administered 2021-06-01: 21:00:00 1200 [IU]/h via INTRAVENOUS
  Administered 2021-06-02: 1400 [IU]/h via INTRAVENOUS
  Administered 2021-06-03: 1600 [IU]/h via INTRAVENOUS
  Filled 2021-06-01 (×2): qty 250

## 2021-06-01 MED ORDER — CITALOPRAM HYDROBROMIDE 20 MG PO TABS
20.0000 mg | ORAL_TABLET | Freq: Every day | ORAL | Status: DC
  Administered 2021-06-02 – 2021-06-05 (×4): 20 mg via ORAL
  Filled 2021-06-01 (×4): qty 1

## 2021-06-01 MED ORDER — ONDANSETRON HCL 4 MG/2ML IJ SOLN
4.0000 mg | Freq: Four times a day (QID) | INTRAMUSCULAR | Status: DC | PRN
  Administered 2021-06-02: 4 mg via INTRAVENOUS
  Filled 2021-06-01: qty 2

## 2021-06-01 MED ORDER — GABAPENTIN 400 MG PO CAPS
800.0000 mg | ORAL_CAPSULE | Freq: Three times a day (TID) | ORAL | Status: DC
  Administered 2021-06-02 (×2): 800 mg via ORAL
  Filled 2021-06-01 (×4): qty 2

## 2021-06-01 MED ORDER — VANCOMYCIN HCL IN DEXTROSE 1-5 GM/200ML-% IV SOLN
1000.0000 mg | Freq: Once | INTRAVENOUS | Status: AC
  Administered 2021-06-01: 1000 mg via INTRAVENOUS
  Filled 2021-06-01: qty 200

## 2021-06-01 MED ORDER — METRONIDAZOLE 500 MG/100ML IV SOLN
500.0000 mg | Freq: Once | INTRAVENOUS | Status: AC
  Administered 2021-06-01: 500 mg via INTRAVENOUS
  Filled 2021-06-01: qty 100

## 2021-06-01 MED ORDER — ROPINIROLE HCL 1 MG PO TABS
1.0000 mg | ORAL_TABLET | Freq: Two times a day (BID) | ORAL | Status: DC
  Administered 2021-06-02 – 2021-06-05 (×8): 1 mg via ORAL
  Filled 2021-06-01 (×9): qty 1

## 2021-06-01 MED ORDER — SODIUM CHLORIDE 0.9 % IV BOLUS
500.0000 mL | Freq: Once | INTRAVENOUS | Status: AC
  Administered 2021-06-01: 500 mL via INTRAVENOUS

## 2021-06-01 MED ORDER — ACETAMINOPHEN 325 MG PO TABS
650.0000 mg | ORAL_TABLET | Freq: Four times a day (QID) | ORAL | Status: DC | PRN
  Administered 2021-06-02 – 2021-06-03 (×5): 650 mg via ORAL
  Filled 2021-06-01 (×5): qty 2

## 2021-06-01 MED ORDER — ICOSAPENT ETHYL 1 G PO CAPS
2.0000 g | ORAL_CAPSULE | Freq: Two times a day (BID) | ORAL | Status: DC
  Administered 2021-06-02 – 2021-06-05 (×8): 2 g via ORAL
  Filled 2021-06-01 (×9): qty 2

## 2021-06-01 MED ORDER — ONDANSETRON HCL 4 MG PO TABS: 4.0000 mg | ORAL_TABLET | Freq: Four times a day (QID) | ORAL | Status: DC | PRN

## 2021-06-01 MED ORDER — NITROGLYCERIN 0.4 MG SL SUBL: 0.4000 mg | SUBLINGUAL_TABLET | SUBLINGUAL | Status: DC | PRN

## 2021-06-01 MED ORDER — VANCOMYCIN HCL 1750 MG/350ML IV SOLN: 1750.0000 mg | INTRAVENOUS | Status: DC

## 2021-06-01 MED ORDER — CLOPIDOGREL BISULFATE 75 MG PO TABS
75.0000 mg | ORAL_TABLET | Freq: Every day | ORAL | Status: DC
  Administered 2021-06-02 – 2021-06-05 (×4): 75 mg via ORAL
  Filled 2021-06-01 (×4): qty 1

## 2021-06-01 MED ORDER — ACETAMINOPHEN 650 MG RE SUPP: 650.0000 mg | Freq: Four times a day (QID) | RECTAL | Status: DC | PRN

## 2021-06-01 MED ORDER — HEPARIN BOLUS VIA INFUSION
4000.0000 [IU] | Freq: Once | INTRAVENOUS | Status: AC
  Administered 2021-06-01: 4000 [IU] via INTRAVENOUS
  Filled 2021-06-01: qty 4000

## 2021-06-01 MED ORDER — SODIUM CHLORIDE 0.9 % IV BOLUS (SEPSIS)
500.0000 mL | Freq: Once | INTRAVENOUS | Status: AC
  Administered 2021-06-01: 500 mL via INTRAVENOUS

## 2021-06-01 MED ORDER — DICYCLOMINE HCL 10 MG PO CAPS
10.0000 mg | ORAL_CAPSULE | Freq: Three times a day (TID) | ORAL | Status: DC | PRN
  Filled 2021-06-01: qty 1

## 2021-06-01 MED ORDER — ROSUVASTATIN CALCIUM 10 MG PO TABS
40.0000 mg | ORAL_TABLET | Freq: Every day | ORAL | Status: DC
  Administered 2021-06-02 – 2021-06-05 (×4): 40 mg via ORAL
  Filled 2021-06-01: qty 4
  Filled 2021-06-01 (×2): qty 2
  Filled 2021-06-01 (×2): qty 4

## 2021-06-01 MED ORDER — ASPIRIN EC 81 MG PO TBEC
81.0000 mg | DELAYED_RELEASE_TABLET | Freq: Every day | ORAL | Status: DC
  Administered 2021-06-02 – 2021-06-05 (×4): 81 mg via ORAL
  Filled 2021-06-01 (×4): qty 1

## 2021-06-01 MED ORDER — ACETAMINOPHEN 500 MG PO TABS: 1000.0000 mg | ORAL_TABLET | Freq: Once | ORAL | Status: DC

## 2021-06-01 MED ORDER — IOHEXOL 350 MG/ML SOLN
80.0000 mL | Freq: Once | INTRAVENOUS | Status: AC | PRN
  Administered 2021-06-01: 80 mL via INTRAVENOUS

## 2021-06-01 MED ORDER — SODIUM CHLORIDE 0.9 % IV SOLN
500.0000 mg | INTRAVENOUS | Status: DC
  Administered 2021-06-01: 500 mg via INTRAVENOUS
  Filled 2021-06-01: qty 5

## 2021-06-01 MED ORDER — ENOXAPARIN SODIUM 40 MG/0.4ML IJ SOSY: 40.0000 mg | PREFILLED_SYRINGE | INTRAMUSCULAR | Status: DC

## 2021-06-01 MED ORDER — METRONIDAZOLE 500 MG/100ML IV SOLN
500.0000 mg | Freq: Two times a day (BID) | INTRAVENOUS | Status: DC
  Administered 2021-06-02 – 2021-06-05 (×7): 500 mg via INTRAVENOUS
  Filled 2021-06-01 (×8): qty 100

## 2021-06-01 MED ORDER — LABETALOL HCL 5 MG/ML IV SOLN: 5.0000 mg | INTRAVENOUS | Status: DC | PRN

## 2021-06-01 MED ORDER — SODIUM CHLORIDE 0.9 % IV SOLN
2.0000 g | Freq: Three times a day (TID) | INTRAVENOUS | Status: DC
  Administered 2021-06-02: 2 g via INTRAVENOUS
  Filled 2021-06-01: qty 2

## 2021-06-01 MED ORDER — SODIUM CHLORIDE 0.9 % IV SOLN
2.0000 g | Freq: Once | INTRAVENOUS | Status: AC
  Administered 2021-06-01: 2 g via INTRAVENOUS
  Filled 2021-06-01: qty 2

## 2021-06-01 MED ORDER — SODIUM CHLORIDE 0.9 % IV SOLN: 2.0000 g | INTRAVENOUS | Status: DC

## 2021-06-01 MED ORDER — ACETAMINOPHEN 10 MG/ML IV SOLN
1000.0000 mg | Freq: Once | INTRAVENOUS | Status: AC
  Administered 2021-06-01: 1000 mg via INTRAVENOUS
  Filled 2021-06-01: qty 100

## 2021-06-01 NOTE — Consult Note (Signed)
Pharmacy Antibiotic Note  Willie Clark is a 71 y.o. male admitted on 06/01/2021 with sepsis.  Pharmacy has been consulted for vancomycin dosing.  Plan: Vancomycin 2000 mg IV loading dose, followed by 1750 mg IV q24h Goal AUC 400-550  Est AUC: 522.7  Est Cmax: 37.2 Est Cmin: 12.5 Calculated with SCr 1.31, Vd 0.72  Cefepime 2 g IV q8h   Monitor clinical picture, renal function, and vancomycin levels at steady state F/U C&S, abx deescalation / LOT   Height: 5\' 10"  (177.8 cm) Weight: 94.2 kg (207 lb 10.8 oz) IBW/kg (Calculated) : 73  Temp (24hrs), Avg:100.6 F (38.1 C), Min:98 F (36.7 C), Max:103.2 F (39.6 C)  Recent Labs  Lab 06/01/21 1638 06/01/21 1642  WBC 11.3*  --   CREATININE 1.31*  --   LATICACIDVEN  --  2.8*    Estimated Creatinine Clearance: 60.5 mL/min (A) (by C-G formula based on SCr of 1.31 mg/dL (H)).    Allergies  Allergen Reactions   Amitriptyline Other (See Comments)    Pt unsure of what happens Other reaction(s): Other (See Comments) Pt unsure of what happens Pt unsure of what happens   Cyclobenzaprine Other (See Comments) and Hives    Pt unsure what hapens Pt unsure what hapens Pt unsure what hapens    Dicyclomine Shortness Of Breath and Swelling   Sacubitril-Valsartan Anaphylaxis and Itching   Amitriptyline Hcl    Berniece Salines Flavor Diarrhea   Hydrocodone    Fish Oil Itching   Lidocaine Nausea And Vomiting, Other (See Comments) and Nausea Only    Nasal Spray (only time had reaction) Other reaction(s): Dizziness Nasal Spray (only time had reaction)   Omega-3 Fatty Acids-Vitamin E Itching   Other Diarrhea and Itching    Reactive agents: Onions, bacon, steak   Oxycodone-Acetaminophen Other (See Comments)    Lowers BP Other reaction(s): Other (See Comments) Lowers BP Lowers BP   Sulfa Antibiotics Itching    Antimicrobials this admission: 2/10 flagyl x 1 2/10 vancomycin >>  2/10 cefepime >> 2/10 azithromycin >>  Dose adjustments this  admission:   Microbiology results: 2/10 BCx: pending 2/10 UCx: pending    Thank you for allowing pharmacy to be a part of this patients care.  Darnelle Bos, PharmD 06/01/2021 7:15 PM

## 2021-06-01 NOTE — Assessment & Plan Note (Addendum)
-   Multifactorial including ACS secondary to sepsis versus NSTEMI secondary to severe sepsis - Given patient's extensive coronary artery disease, NSTEMI may be secondary to sepsis, however ACS cannot be excluded at this time due to patient's presentation and chief concern was chest pain - Heparin GTT has been ordered - Cardiology service has been consulted via secure chat, Dr. Aundra Dubin is aware and recommends to continue heparin GTT

## 2021-06-01 NOTE — Assessment & Plan Note (Addendum)
-   Etiology work-up in progress at this time - Differentials include diverticulitis versus community-acquired pneumonia - Blood cultures x2 - Urine culture - Elevated procalcitonin and lactic acid - Continue to trend lactic acid - Status post sodium chloride 500 mL bolus - Ordered additional 500 mL bolus due to patient's low EF - Status post vancomycin, cefepime, metronidazole per EDP - Continue broad-spectrum antibiotic - Added azithromycin for atypical pneumonia coverage

## 2021-06-01 NOTE — Sepsis Progress Note (Signed)
Notified bedside nurse of need to draw repeat lactic acid. 

## 2021-06-01 NOTE — Sepsis Progress Note (Signed)
eLink is following this Code Sepsis. °

## 2021-06-01 NOTE — Consult Note (Signed)
PHARMACY -  BRIEF ANTIBIOTIC NOTE   Pharmacy has received consult(s) for Vancomycin and Cefepime from an ED provider.  The patient's profile has been reviewed for ht/wt/allergies/indication/available labs.    One time order(s) placed for Vancomycin 1g IV and Cefepime 2g IV x 1 dose each.  Further antibiotics/pharmacy consults should be ordered by admitting physician if indicated.                       Thank you, Pearla Dubonnet 06/01/2021  4:55 PM

## 2021-06-01 NOTE — Consult Note (Signed)
ANTICOAGULATION CONSULT NOTE - Initial Consult  Pharmacy Consult for heparin Indication: chest pain/ACS  Allergies  Allergen Reactions   Amitriptyline Other (See Comments)    Pt unsure of what happens Other reaction(s): Other (See Comments) Pt unsure of what happens Pt unsure of what happens   Cyclobenzaprine Other (See Comments) and Hives    Pt unsure what hapens Pt unsure what hapens Pt unsure what hapens    Dicyclomine Shortness Of Breath and Swelling   Sacubitril-Valsartan Anaphylaxis and Itching   Amitriptyline Hcl    Berniece Salines Flavor Diarrhea   Hydrocodone    Fish Oil Itching   Lidocaine Nausea And Vomiting, Other (See Comments) and Nausea Only    Nasal Spray (only time had reaction) Other reaction(s): Dizziness Nasal Spray (only time had reaction)   Omega-3 Fatty Acids-Vitamin E Itching   Other Diarrhea and Itching    Reactive agents: Onions, bacon, steak   Oxycodone-Acetaminophen Other (See Comments)    Lowers BP Other reaction(s): Other (See Comments) Lowers BP Lowers BP   Sulfa Antibiotics Itching    Patient Measurements: Height: 5\' 10"  (177.8 cm) Weight: 94.2 kg (207 lb 10.8 oz) IBW/kg (Calculated) : 73 Heparin Dosing Weight: 92.1 kg  Vital Signs: Temp: 103.2 F (39.6 C) (02/10 1647) Temp Source: Oral (02/10 1647) BP: 101/50 (02/10 1745) Pulse Rate: 99 (02/10 1850)  Labs: Recent Labs    06/01/21 1638 06/01/21 1836  HGB 14.1  --   HCT 42.7  --   PLT 176  --   APTT 26  --   LABPROT 12.0  --   INR 0.9  --   CREATININE 1.31*  --   TROPONINIHS 14 139*    Estimated Creatinine Clearance: 60.5 mL/min (A) (by C-G formula based on SCr of 1.31 mg/dL (H)).   Medical History: Past Medical History:  Diagnosis Date   Anxiety    Arthritis    Barrett's esophagus    Bowel obstruction ([place]) 02/2009   small bowel   CHF (congestive heart failure) (HCC)    Chronic chest pain    Coronary artery disease    a. 1993 CABG; b. 2007 Redo CABG; c. 09/2015 Cath:  LM mild dzs, LAD 100ost, RI mild dzs, LCX 60p, OM1 100, RCA 100ost, VG->OM2 min irregs, LIMA->LAD nl, VG->RPDA 30ost, 20d, nl EF->Med rx.   Depression    Diverticulitis    Diverticulosis    Erectile dysfunction    Fibromyalgia    Gastric ulcer    Gastritis    GERD (gastroesophageal reflux disease)    Headache    Hemorrhoids    HFrEF (heart failure with reduced ejection fraction) ([place])    a. 09/2016 Echo: EF 55-60%, no rwma, triv AI, mild MR, mildly dil LA. -> as of April 2020: EF 40 to 45%.  Septal dyssynergy/hypokinesis due to postop state but otherwise unable to assess wall motion [name] due to poor study.   History of myocardial infarct at age less than 61 years    Hyperlipidemia    Hypertension    Kidney stones    going to [facility]  June 26 to see kidney specialist   Lightheadedness    Mass on back    Nephrolithiasis    Orthostatic hypotension    a. Improved after discontinuation of metoprolol.   OSA (obstructive sleep apnea)    a. On CPAP.   Pulmonary embolism ([place]) 01/2006   Post-op/treated   Skin lesions, generalized    Stroke [facility]) 1993   right side  weakness no blood thinners   on Aspirin   Syncope and collapse    Urticaria     Medications:  No PTA anticoagulation, only Plavix 75 mg and aspirin 81 mg daily  Assessment: 71 y.o. male with CAD and CHF presents to the ED with chest pain. Troponin elevated to 139. Pharmacy has been consulted for heparin dosing for ACS.   Baseline labs: H/H and plts WNL, aPTT 26, INR 0.9  Goal of Therapy:  Heparin level 0.3-0.7 units/ml Monitor platelets by anticoagulation protocol: Yes   Plan:  Give 4000 units bolus x 1 Start heparin infusion at 1200 units/hr Check anti-Xa level in 8 hours and daily while on heparin Continue to monitor H&H and platelets  Darnelle Bos, PharmD 06/01/2021,8:06 PM

## 2021-06-01 NOTE — Assessment & Plan Note (Signed)
-   Present on admission - Continue metronidazole and cefepime IV

## 2021-06-01 NOTE — Assessment & Plan Note (Signed)
-   Resumed home aspirin 81 mg daily, Plavix 75 mg daily for 06/02/2021 as patient has taken these medications prior to presentation to the ED per med reconciliation - Resumed home dosing of high intensity Crestor 40 mg daily, Vascepa twice daily - Holding home Coreg and other antihypertensive medications at this time due to severe sepsis

## 2021-06-01 NOTE — ED Provider Notes (Signed)
[facility] Provider Note    Event Date/Time   First MD Initiated Contact with Patient 06/01/21 1631     (approximate)   History   Chest Pain   HPI  Willie Clark is a 71 y.o. male with coronary disease with chronic systolic heart failure who comes in with chest pain. Sudden onset 1 hour chest pain.  V3 and V4 st depressions noted, but then after nitro they seemed to be resolved and on posterior EKG there was no ST elevation noted.   On review of records I reviewed the cardiology note from November 2022.  Patient is followed by Dr. Fletcher Anon.  Patient has had a coronary bypass in 1993 and a redo in 2007.  He does suffer from fibromyalgia and chronic pain. He underwent successful PCI to SVG to RCA with overlapping resolute Onyx drug-eluting stents in March and EF 35%  Patient is only reporting pain in his chest.  Started 2 hours prior to arrival.  However patient seems very sleepy and frequently drifting off and not fully answering all my questions.  Patient seems to have a change in his mental status.  We checked a temperature and he was febrile to 103.  Glucoses were normal.  Unable to get full HPI due to patient's altered mental status from fever  Physical Exam   Triage Vital Signs: Blood pressure (!) 101/50, pulse (!) 104, temperature (!) 103.2 F (39.6 C), temperature source Oral, resp. rate (!) 21, height 5\' 10"  (1.778 m), weight 94.2 kg, SpO2 94 %.   Most recent vital signs: Vitals:   06/01/21 1730 06/01/21 1745  BP: (!) 107/53 (!) 101/50  Pulse: (!) 106 (!) 104  Resp: (!) 23 (!) 21  Temp:    SpO2: 93% 94%     General: Patient is able to wake up and is alert and oriented x2 but sleepy CV:  Good peripheral perfusion.  Tachycardic Resp:  Normal effort.  Abd:  No distention.  Hard to tell if he is has any tenderness due to him being kind of sleepy Other:  Equal strength in arms and legs.  Trace edema in bilateral legs   ED Results / Procedures /  Treatments   Labs (all labs ordered are listed, but only abnormal results are displayed) Labs Reviewed  CBC WITH DIFFERENTIAL/PLATELET - Abnormal; Notable for the following components:      Result Value   WBC 11.3 (*)    Neutro Abs 9.1 (*)    All other components within normal limits  COMPREHENSIVE METABOLIC PANEL - Abnormal; Notable for the following components:   Sodium 133 (*)    Glucose, Bld 105 (*)    Creatinine, Ser 1.31 (*)    Calcium 8.6 (*)    GFR, Estimated 59 (*)    All other components within normal limits  LACTIC ACID, PLASMA - Abnormal; Notable for the following components:   Lactic Acid, Venous 2.8 (*)    All other components within normal limits  URINALYSIS, ROUTINE W REFLEX MICROSCOPIC - Abnormal; Notable for the following components:   Color, Urine YELLOW (*)    APPearance CLEAR (*)    Glucose, UA >=500 (*)    All other components within normal limits  BLOOD GAS, VENOUS - Abnormal; Notable for the following components:   Bicarbonate 32.3 (*)    Acid-Base Excess 6.3 (*)    All other components within normal limits  CBG MONITORING, ED - Abnormal; Notable for the following components:  Glucose-Capillary 112 (*)    All other components within normal limits  RESP PANEL BY RT-PCR (FLU A&B, COVID) ARPGX2  CULTURE, BLOOD (ROUTINE X 2)  CULTURE, BLOOD (ROUTINE X 2)  URINE CULTURE  LIPASE, BLOOD  PROTIME-INR  APTT  AMMONIA  LACTIC ACID, PLASMA  PROCALCITONIN  PROCALCITONIN  BRAIN NATRIURETIC PEPTIDE  TROPONIN I (HIGH SENSITIVITY)  TROPONIN I (HIGH SENSITIVITY)     EKG  My interpretation of EKG: Normal sinus rate of 95 with T wave inversions in the inferior lateral leads, normal intervals.  Maybe 1 mm ST elevation in aVR reviewed prior EKGs and has had some of these T wave inversions previously.   RADIOLOGY I have reviewed the xray personally and agree with radiology read chest x-ray was negative  CT head, PE, abdomen are still pending  read   PROCEDURES:  Critical Care performed: Yes, see critical care procedure note(s)  .Critical Care Performed by: Vanessa Breckenridge, MD Authorized by: Vanessa [place], MD   Critical care provider statement:    Critical care time (minutes):  30   Critical care was necessary to treat or prevent imminent or life-threatening deterioration of the following conditions:  Sepsis   Critical care was time spent personally by me on the following activities:  Development of treatment plan with patient or surrogate, discussions with consultants, evaluation of patient's response to treatment, examination of patient, ordering and review of laboratory studies, ordering and review of radiographic studies, ordering and performing treatments and interventions, pulse oximetry, re-evaluation of patient's condition and review of old charts .1-3 Lead EKG Interpretation Performed by: Vanessa Cortland, MD Authorized by: Vanessa , MD     Interpretation: abnormal     ECG rate:  100   ECG rate assessment: tachycardic     Rhythm: sinus tachycardia     Ectopy: none     Conduction: normal     MEDICATIONS ORDERED IN ED: Medications  vancomycin (VANCOCIN) IVPB 1000 mg/200 mL premix (1,000 mg Intravenous New Bag/Given 06/01/21 1838)  sodium chloride 0.9 % bolus 500 mL (has no administration in time range)  ceFEPIme (MAXIPIME) 2 g in sodium chloride 0.9 % 100 mL IVPB (0 g Intravenous Stopped 06/01/21 1701)  metroNIDAZOLE (FLAGYL) IVPB 500 mg (0 mg Intravenous Stopped 06/01/21 1819)  acetaminophen (OFIRMEV) IV 1,000 mg (0 mg Intravenous Stopped 06/01/21 1749)  iohexol (OMNIPAQUE) 350 MG/ML injection 80 mL (80 mLs Intravenous Contrast Given 06/01/21 1753)     IMPRESSION / MDM / ASSESSMENT AND PLAN / ED COURSE  I reviewed the triage vital signs and the nursing notes.                              Patient with complex cardiac history who comes in with reported chest pain but noted to be slightly altered with fever.  EKG  does show a lot of T wave inversions with some slight elevation in aVR but do not feel like this represents a STEMI at this time especially given patient's febrile.  We will treat for sepsis to start and start broad-spectrum antibiotics, Tylenol and give a little bit of gentle fluids given patient does have CHF.  Given patient is not the best historian we will get pan CT scan to look for cause for infection but I suspect that there could be a pneumonia versus COVID given patient is slightly hypoxic  CBC shows elevated white count at 11 with left shift  COVID, flu are negative UA are negative other than glucose in it VBG without evidence of hypercapnia Lactate elevated 2.8 we will give some gentle hydration given known decreased EF CMP shows slightly elevated kidney function Lipase normal Initial troponin is negative but will get repeat  CT head is negative other than atrophy noted CT PE is also negative  CT abdomen concerning for diverticulitis.  On repeat assessment patient now more awake.  He is more participating in the examination.  He does report left lower quadrant pain which is consistent with his diverticulitis.  Repeat EKG my interpretation is sinus rhythm 97 without any ST elevation but continued inferior lateral T wave inversions.  He denies any chest pain at this time.  Patient getting repeat troponin due to onset of chest pain.  We will discussed with the hospital team for admission for sepsis, diverticulitis encephalopathy    The patient is on the cardiac monitor to evaluate for evidence of arrhythmia and/or significant heart rate changes.   FINAL CLINICAL IMPRESSION(S) / ED DIAGNOSES   Final diagnoses:  Sepsis, due to unspecified organism, unspecified whether acute organ dysfunction present [facility])  Diverticulitis  Encephalopathy     Rx / DC Orders   ED Discharge Orders     None        Note:  This document was prepared using Dragon voice recognition software and  may include unintentional dictation errors.   Vanessa [place], MD 06/01/21 [phone]

## 2021-06-01 NOTE — Sepsis Progress Note (Signed)
Notified provider of need to order repeat lactic acid. ° °

## 2021-06-01 NOTE — Assessment & Plan Note (Signed)
-   Patient is currently septic with hypotension - Holding home Coreg 3.125 mg p.o. twice daily, furosemide 20 mg daily, Imdur 60 mg daily, losartan 25 mg daily, spironolactone 25 mg daily - Labetalol 5 mg IV every 2 hours as needed for SBP greater than 170, 4 doses ordered

## 2021-06-01 NOTE — Hospital Course (Addendum)
[name] is a 71 year old male with history of CAD status post CABG (in 1993 and 2007) and PCI (2006 and 2022), most recent PCI in July 2022, heart failure reduced ejection fraction, hypertension, hyperlipidemia, depression, anxiety, abdominal aortic aneurysm, neuropathy, who presents emergency department for chief concerns of chest pain.  Dose in the emergency department showed temperature of 103, respiration rate of 28, heart rate of 102, blood pressure 115/62, SPO2 95% on room air.  VBG in the emergency department showed 7.4 1/51/37.  Serum sodium was 133, potassium 4.5, chloride 99, bicarb 22, BUN of 11, serum creatinine of 1.31, nonfasting blood glucose 102, GFR 59.  WBC was elevated at 11.3, hemoglobin 14.1, platelets of 176.  Lactic acid was elevated at 2.8 and increased to 3.0.  Procalcitonin was 1.63 and increased to 8.05.  COVID/nonsensitive/influenza B PCR were negative.  CTA chest for pulmonary embolism was read and found to be negative for acute PE or aortic dissection.  No acute airspace disease.  CT abdomen and pelvis with contrast was ordered and was read as acute uncomplicated diverticulitis at the descending/sigmoid junction.  Bilateral nephrolithiasis.  Moderate hepatic steatosis similar to prior study.  Small hiatal hernia.  Small fat-containing umbilical hernia.  Per ED provider, in the middle of her HPI, patient became sleepy and difficult to arouse.  Patient's mentation improved after he received IV antibiotics and fluid.  ED treatment: Cefepime 2 g IV, metronidazole 500 mg IV, sodium chloride 500 mL bolus, vancomycin per pharmacy for sepsis.

## 2021-06-01 NOTE — Assessment & Plan Note (Signed)
-   Community-acquired pneumonia - Added azithromycin 500 mg IV for atypical coverage - Continue vancomycin and cefepime IV - Check MRSA PCR

## 2021-06-01 NOTE — ED Triage Notes (Signed)
BIB ems from home. HX of STEMI March 2022 with stents in july 22. ST depression. NTG and 15 lead. Depression went away. 325 ASA PTA ems. EMS did 2 NTG.  140/90 92% RA  BGL

## 2021-06-01 NOTE — Consult Note (Signed)
CODE SEPSIS - PHARMACY COMMUNICATION  **Broad Spectrum Antibiotics should be administered within 1 hour of Sepsis diagnosis**  Time Code Sepsis Called/Page Received: 1646  Antibiotics Ordered: Cefepime, Vancomycin, Flagyl  Time of 1st antibiotic administration: 4742  Additional action taken by pharmacy: none  If necessary, Name of Provider/Nurse Contacted: n/a    Willie Clark ,PharmD Clinical Pharmacist  06/01/2021  5:06 PM

## 2021-06-01 NOTE — H&P (Addendum)
History and Physical   Willie Clark DQQ:[id number] DOB: 06/15/1950 DOA: 06/01/2021  PCP: [facility] [place]  Outpatient Specialists: Dr. Rockey Situ, cardiology Patient coming from: Home via EMS   I have personally briefly reviewed patient's old medical records in [place].  Chief Concern: Chest pain [name] is a 71 year old male with significant history of CAD  HPI: [name] is a 71 year old male with history of CAD status post CABG (in 1993 and 2007) and PCI (2006 and 2022), most recent PCI in July 2022, heart failure reduced ejection fraction, hypertension, hyperlipidemia, depression, anxiety, abdominal aortic aneurysm, neuropathy, who presents emergency department for chief concerns of chest pain.  Dose in the emergency department showed temperature of 103, respiration rate of 28, heart rate of 102, blood pressure 115/62, SPO2 95% on room air.  VBG in the emergency department showed 7.4 1/51/37.  Serum sodium was 133, potassium 4.5, chloride 99, bicarb 22, BUN of 11, serum creatinine of 1.31, nonfasting blood glucose 102, GFR 59.  WBC was elevated at 11.3, hemoglobin 14.1, platelets of 176.  Lactic acid was elevated at 2.8 and increased to 3.0.  Procalcitonin was 1.63 and increased to 8.05.  COVID/nonsensitive/influenza B PCR were negative.  CTA chest for pulmonary embolism was read and found to be negative for acute PE or aortic dissection.  No acute airspace disease.  CT abdomen and pelvis with contrast was ordered and was read as acute uncomplicated diverticulitis at the descending/sigmoid junction.  Bilateral nephrolithiasis.  Moderate hepatic steatosis similar to prior study.  Small hiatal hernia.  Small fat-containing umbilical hernia.  Per ED provider, in the middle of her HPI, patient became sleepy and difficult to arouse.  Patient's mentation improved after he received IV antibiotics and fluid.  ED treatment: Cefepime 2 g IV,  metronidazole 500 mg IV, sodium chloride 500 mL bolus, vancomycin per pharmacy for sepsis.  At bedside patient was able to tell me his name, his age, and he knows he is in the hospital.  He states his main complaint is chest pain, he describes as sharp, 9 out of 10 and is now a 2 out of 10.  He states that the pain lasted about 1 hour and is similar to his pain in 2022 when he had stents placed in his heart.  This prompted him to call EMS to come to the emergency department for further evaluation.  He states that shortness of breath is worse with exertion.  He endorses bilateral lower extremity swelling that is worse than his normal.  He endorses weight gain of approximately 1.5 pounds in the last 2 days.  He denies changes in diet and states that he eats a low-sodium diet.  He endorses shortness of breath.  He denies any nausea, vomiting, fever, diarrhea.  He endorses left lower quadrant abdominal tenderness.  He states he is never felt this way before.   Social history: He is a former tobacco user, quitting over 20 years ago.  ROS: Constitutional: no weight change, no fever ENT/Mouth: no sore throat, no rhinorrhea Eyes: no eye pain, no vision changes Cardiovascular: + chest pain, + dyspnea,  no edema, no palpitations Respiratory: + cough, no sputum, no wheezing Gastrointestinal: no nausea, no vomiting, no diarrhea, no constipation Genitourinary: no urinary incontinence, no dysuria, no hematuria Musculoskeletal: no arthralgias, no myalgias Skin: no skin lesions, no pruritus, Neuro: + weakness, no loss of consciousness, no syncope Psych: no anxiety, no depression, + decrease appetite Heme/Lymph: no  bruising, no bleeding  ED Course: Discussed with emergency medicine provider, patient requiring hospitalization for chief concerns of severe sepsis.  Assessment/Plan  Principal Problem:   Severe sepsis (HCC) Active Problems:   Coronary artery disease involving native coronary artery of  native heart with angina pectoris (HCC)   Hyperlipidemia   Abdominal pain   Essential hypertension   Depression   NSTEMI (non-ST elevated myocardial infarction) (HCC)   GERD (gastroesophageal reflux disease)   HFrEF (heart failure with reduced ejection fraction) (HCC)   Hx of CABG   COPD (chronic obstructive pulmonary disease) (HCC)   Left lower lobe pneumonia   Diverticulitis   OSA on CPAP   * Severe sepsis (HCC) Assessment & Plan - Etiology work-up in progress at this time - Differentials include diverticulitis versus community-acquired pneumonia - Blood cultures x2 - Urine culture - Elevated procalcitonin and lactic acid - Continue to trend lactic acid - Status post sodium chloride 500 mL bolus - Ordered additional 500 mL bolus due to patient's low EF - Status post vancomycin, cefepime, metronidazole per EDP - Continue broad-spectrum antibiotic - Added azithromycin for atypical pneumonia coverage  NSTEMI (non-ST elevated myocardial infarction) [facility]) Assessment & Plan - Multifactorial including ACS secondary to sepsis versus NSTEMI secondary to severe sepsis - Given patient's extensive coronary artery disease, NSTEMI may be secondary to sepsis, however ACS cannot be excluded at this time due to patient's presentation and chief concern was chest pain - Heparin GTT has been ordered - Cardiology service has been consulted via secure chat, Dr. Aundra Dubin is aware and recommends to continue heparin GTT - Complete echo ordered  Essential hypertension Assessment & Plan - Patient is currently septic with hypotension - Holding home Coreg 3.125 mg p.o. twice daily, furosemide 20 mg daily, Imdur 60 mg daily, losartan 25 mg daily, spironolactone 25 mg daily - Labetalol 5 mg IV every 2 hours as needed for SBP greater than 170, 4 doses ordered  Coronary artery disease involving native coronary artery of native heart with angina pectoris (HCC) Assessment & Plan - Resumed home aspirin 81 mg  daily, Plavix 75 mg daily for 06/02/2021 as patient has taken these medications prior to presentation to the ED per med reconciliation - Resumed home dosing of high intensity Crestor 40 mg daily, Vascepa twice daily - Holding home Coreg and other antihypertensive medications at this time due to severe sepsis  Left lower lobe pneumonia Assessment & Plan - Community-acquired pneumonia - Added azithromycin 500 mg IV for atypical coverage - Continue vancomycin and cefepime IV - Check MRSA PCR  Diverticulitis Assessment & Plan - Present on admission - Continue metronidazole and cefepime IV  OSA on CPAP Assessment & Plan - CPAP nightly ordered  Chart reviewed.   Complete echo 11/15/2020: Left ventricular ejection fraction, by estimation, 40 to 45%.  Left ventricle has mildly decreased function.  Mild global hypokinesis with moderate anteroseptal wall hypokinesis possibly secondary to postoperative state and inferior wall hypokinesis.  RVSP is 24.9 millimeters of mercury.  Left heart cath 11/15/2020: Severe three-vessel coronary artery disease including chronic total occlusion of the ostial LAD, OM1, proximal RCA.  Patent left circumflex stent with moderate in-stent restenosis (50%), similar to prior catheterization.  Widely patent LIMA to LAD.  Patent SVG to RPDA with patent proximal/mid graft stents that demonstrate mild in-stent restenosis (approximately 10%).  Patent SVG to OM with sequential 75% and 60% ostial and mid graft stenosis.  Successful PCI to SVG to OM with overlapping resolute Onyx and  mid graft DES.  - Recommendations: Dual antiplatelet therapy with aspirin and Singulair for at least 12 months, ideally longer.  Cardiology outpatient clinic 03/13/2021: Shortness of breath initially improved after switching ticagrelor to clopidogrel but he reports recurrent shortness of breath but symptoms are overall stable.  Continue high-dose rosuvastatin and Vascepa.  DVT prophylaxis: Heparin  GTT Code Status: Full code Diet: N.p.o. except for sips with meds and ice chips Family Communication: Updated spouse at bedside with patient's permission Disposition Plan: Pending clinical course, anticipate greater than 2 night stay Consults called: Cardiology Admission status: Telemetry cardiac, inpatient  Past Medical History:  Diagnosis Date   Anxiety    Arthritis    Barrett's esophagus    Bowel obstruction ([place]) 02/2009   small bowel   CHF (congestive heart failure) ([place])    Chronic chest pain    Coronary artery disease    a. 1993 CABG; b. 2007 Redo CABG; c. 09/2015 Cath: LM mild dzs, LAD 100ost, RI mild dzs, LCX 60p, OM1 100, RCA 100ost, VG->OM2 min irregs, LIMA->LAD nl, VG->RPDA 30ost, 20d, nl EF->Med rx.   Depression    Diverticulitis    Diverticulosis    Erectile dysfunction    Fibromyalgia    Gastric ulcer    Gastritis    GERD (gastroesophageal reflux disease)    Headache    Hemorrhoids    HFrEF (heart failure with reduced ejection fraction) ([place])    a. 09/2016 Echo: EF 55-60%, no rwma, triv AI, mild MR, mildly dil LA. -> as of April 2020: EF 40 to 45%.  Septal dyssynergy/hypokinesis due to postop state but otherwise unable to assess wall motion [name] due to poor study.   History of myocardial infarct at age less than 73 years    Hyperlipidemia    Hypertension    Kidney stones    going to [facility]  June 26 to see kidney specialist   Lightheadedness    Mass on back    Nephrolithiasis    Orthostatic hypotension    a. Improved after discontinuation of metoprolol.   OSA (obstructive sleep apnea)    a. On CPAP.   Pulmonary embolism ([place]) 01/2006   Post-op/treated   Skin lesions, generalized    Stroke [facility]) 1993   right side weakness no blood thinners   on Aspirin   Syncope and collapse    Urticaria    Past Surgical History:  Procedure Laterality Date   BACK SURGERY     bowel obstruction  01/2009   CARDIAC CATHETERIZATION  11/2006   patent grafts.  Significant OM3 disease and occluded diagonals.   CARDIAC CATHETERIZATION  06/2010   patent grafts. significant ISR in proximal LCX but OM2 is bypassed and gives retrograde flow to OM3.    CARDIAC CATHETERIZATION  06/2013   [facility]: Patent grafts with 60% proximal in-stent restenosis in the left circumflex with FFR of 0.85   CARDIAC CATHETERIZATION Left 09/25/2015   Procedure: Left Heart Cath and Cors/Grafts Angiography;  Surgeon: Wellington Hampshire, MD;  Location: [place] [facility];  Service: Cardiovascular;  Laterality: Left;   CERVICAL FUSION     CHOLECYSTECTOMY     COLON SURGERY  01/2009   BOWEL RESECTION DUE TO SMALL BOWEL OBSTRUCTION   COLONOSCOPY     COLONOSCOPY WITH PROPOFOL N/A 10/28/2017   Procedure: COLONOSCOPY WITH PROPOFOL;  Surgeon: Lollie Sails, MD;  Location: [facility] ENDOSCOPY;  Service: Endoscopy;  Laterality: N/A;   CORONARY ANGIOPLASTY  2006   CORONARY ARTERY BYPASS  GRAFT  1993/01/2006   redo at [facility] LIMA to LAD, SVG to OM2 and SVG to RPDA   CORONARY STENT INTERVENTION N/A 06/20/2020   Procedure: CORONARY STENT INTERVENTION;  Surgeon: Nelva Bush, MD;  Location: [place] [facility];  Service: Cardiovascular;  Laterality: N/A;   CORONARY STENT INTERVENTION N/A 11/15/2020   Procedure: CORONARY STENT INTERVENTION;  Surgeon: Nelva Bush, MD;  Location: [place] [facility];  Service: Cardiovascular;  Laterality: N/A;   CYSTOSCOPY WITH STENT PLACEMENT Bilateral 09/24/2015   Procedure: CYSTOSCOPY WITH BILATERAL RETROGRADES, BILATERAL STENT PLACEMENT;  Surgeon: Festus Aloe, MD;  Location: [facility] ORS;  Service: Urology;  Laterality: Bilateral;   ESOPHAGOGASTRODUODENOSCOPY N/A 10/25/2014   Procedure: ESOPHAGOGASTRODUODENOSCOPY (EGD);  Surgeon: Lollie Sails, MD;  Location: [facility] ENDOSCOPY;  Service: Endoscopy;  Laterality: N/A;   ESOPHAGOGASTRODUODENOSCOPY (EGD) WITH PROPOFOL N/A 02/03/2017   Procedure: ESOPHAGOGASTRODUODENOSCOPY (EGD) WITH PROPOFOL;  Surgeon: Lollie Sails, MD;  Location: [facility] ENDOSCOPY;  Service: Endoscopy;  Laterality: N/A;   ESOPHAGOGASTRODUODENOSCOPY (EGD) WITH PROPOFOL N/A 04/18/2017   Procedure: ESOPHAGOGASTRODUODENOSCOPY (EGD) WITH PROPOFOL;  Surgeon: Lollie Sails, MD;  Location: [facility] ENDOSCOPY;  Service: Endoscopy;  Laterality: N/A;   EYE SURGERY Bilateral    Cataract Extraction with IOL   HERNIA REPAIR     INGUINAL HERNIA REPAIR Bilateral 01/23/2016   Procedure: HERNIA REPAIR INGUINAL ADULT BILATERAL;  Surgeon: Leonie Green, MD;  Location: [facility] ORS;  Service: General;  Laterality: Bilateral;   LEFT HEART CATH AND CORS/GRAFTS ANGIOGRAPHY N/A 12/13/2019   Procedure: LEFT HEART CATH AND CORS/GRAFTS ANGIOGRAPHY;  Surgeon: Wellington Hampshire, MD;  Location: [place] [facility];  Service: Cardiovascular;  Laterality: N/A;   LEFT HEART CATH AND CORS/GRAFTS ANGIOGRAPHY N/A 06/20/2020   Procedure: LEFT HEART CATH AND CORS/GRAFTS ANGIOGRAPHY;  Surgeon: Nelva Bush, MD;  Location: [place] [facility];  Service: Cardiovascular;  Laterality: N/A;   LUMBAR LAMINECTOMY/DECOMPRESSION MICRODISCECTOMY Left 10/01/2016   Procedure: Left Lumbar four-five Laminotomy for resection of synovial cyst;  Surgeon: Erline Levine, MD;  Location: [facility];  Service: Neurosurgery;  Laterality: Left;  left   NECK SURGERY  06/2009   RIGHT/LEFT HEART CATH AND CORONARY/GRAFT ANGIOGRAPHY N/A 11/15/2020   Procedure: RIGHT/LEFT HEART CATH AND CORONARY/GRAFT ANGIOGRAPHY;  Surgeon: Nelva Bush, MD;  Location: [place] [facility];  Service: Cardiovascular;  Laterality: N/A;   SHOULDER SURGERY  2010   Social History:  reports that he quit smoking about 29 years ago. His smoking use included cigarettes. He smoked an average of 2.5 packs per day. He has never used smokeless tobacco. He reports that he does not currently use alcohol. He reports that he does not use drugs.  Allergies  Allergen Reactions   Amitriptyline Other (See Comments)    Pt unsure of  what happens Other reaction(s): Other (See Comments) Pt unsure of what happens Pt unsure of what happens   Cyclobenzaprine Other (See Comments) and Hives    Pt unsure what hapens Pt unsure what hapens Pt unsure what hapens    Dicyclomine Shortness Of Breath and Swelling   Sacubitril-Valsartan Anaphylaxis and Itching   Amitriptyline Hcl    Berniece Salines Flavor Diarrhea   Hydrocodone    Fish Oil Itching   Lidocaine Nausea And Vomiting, Other (See Comments) and Nausea Only    Nasal Spray (only time had reaction) Other reaction(s): Dizziness Nasal Spray (only time had reaction)   Omega-3 Fatty Acids-Vitamin E Itching   Other Diarrhea and Itching    Reactive agents: Onions, bacon, steak  Oxycodone-Acetaminophen Other (See Comments)    Lowers BP Other reaction(s): Other (See Comments) Lowers BP Lowers BP   Sulfa Antibiotics Itching   Family History  Problem Relation Age of Onset   Heart disease Father 87   Heart attack Father    Breast cancer Paternal Aunt    Family history: Family history reviewed and pertinent for heart disease in father.  Prior to Admission medications   Medication Sig Start Date End Date Taking? Authorizing Provider  albuterol (PROVENTIL HFA;VENTOLIN HFA) 108 (90 Base) MCG/ACT inhaler Inhale into the lungs every 4 (four) hours as needed for wheezing or shortness of breath.    [provider]  aspirin EC 81 MG EC tablet Take 1 tablet (81 mg total) by mouth daily. Swallow whole. 06/23/20   Nolberto Hanlon, MD  carvedilol (COREG) 3.125 MG tablet TAKE 1 TABLET (3.125 MG TOTAL) BY MOUTH 2 (TWO) TIMES DAILY WITH A MEAL. 12/28/20   Wellington Hampshire, MD  citalopram (CELEXA) 20 MG tablet Take 20 mg by mouth daily.     [provider]  clopidogrel (PLAVIX) 75 MG tablet TAKE (1) TABLET 75 MG DAILY. 04/12/21   Wellington Hampshire, MD  dicyclomine (BENTYL) 10 MG capsule TAKE 1 CAPSULE THREE TIMES DAILY AS NEEDED 09/28/20   [provider]  docusate calcium  (SURFAK) 240 MG capsule Take 240 mg by mouth daily.    [provider]  empagliflozin (JARDIANCE) 10 MG TABS tablet Take 1 tablet (10 mg total) by mouth daily before breakfast. 04/12/21   Wellington Hampshire, MD  EPINEPHrine 0.3 mg/0.3 mL IJ SOAJ injection Inject 0.3 mg into the muscle as needed.  12/27/19   [provider]  fexofenadine (ALLEGRA) 180 MG tablet Take 180 mg by mouth daily.    [provider]  finasteride (PROSCAR) 5 MG tablet Take 5 mg by mouth daily.    [provider]  furosemide (LASIX) 20 MG tablet Take 20 mg by mouth daily. 11/02/20   [provider]  gabapentin (NEURONTIN) 800 MG tablet Take 1 tablet by mouth 3 (three) times daily. 12/22/19   [provider]  isosorbide mononitrate (IMDUR) 60 MG 24 hr tablet Take 1 tablet (60 mg total) by mouth daily. 11/06/20   Furth, Cadence H, PA-C  KLOR-CON M20 20 MEQ tablet TAKE 1 TABLET BY MOUTH EVERY DAY 05/30/21   Wellington Hampshire, MD  losartan (COZAAR) 25 MG tablet TAKE 1 TABLET EVERY DAY 03/19/21   Wellington Hampshire, MD  mesalamine (APRISO) 0.375 g 24 hr capsule Take 1.4 mg by mouth daily.    [provider]  Multiple Vitamins-Minerals (HAIR SKIN AND NAILS FORMULA PO) Take 1 capsule by mouth daily.    [provider]  NALTREXONE HCL PO Take 2.5 mg by mouth daily.    [provider]  nitroGLYCERIN (NITROSTAT) 0.4 MG SL tablet Place 1 tablet (0.4 mg total) under the tongue every 5 (five) minutes as needed for chest pain. 06/22/20   Nolberto Hanlon, MD  nystatin-triamcinolone ointment (MYCOLOG) Apply 1 application topically 2 (two) times daily. 03/14/21   Billey Co, MD  ondansetron (ZOFRAN) 4 MG tablet Take 4 mg by mouth every 8 (eight) hours as needed for nausea or vomiting.    [provider]  oxyCODONE (OXY IR/ROXICODONE) 5 MG immediate release tablet 0.5-1 mg every 6 (six) hours as needed. 02/07/21   [provider]  pantoprazole (PROTONIX) 40  MG tablet Take 40 mg by mouth 2 (  two) times daily.     [provider]  Peppermint Oil (IBGARD PO) Take by mouth daily at 2 am.    [provider]  Probiotic Product (PROBIOTIC PO) Take by mouth daily at 8 pm.    [provider]  rOPINIRole (REQUIP) 1 MG tablet Take 1 mg by mouth 2 (two) times daily. 05/18/20   [provider]  rosuvastatin (CRESTOR) 40 MG tablet TAKE 1 TABLET BY MOUTH EVERY DAY 12/18/20   Loel Dubonnet, NP  spironolactone (ALDACTONE) 25 MG tablet TAKE 1 TABLET EVERY DAY 12/28/20   Wellington Hampshire, MD  torsemide (DEMADEX) 20 MG tablet Take 2 tablets (40 mg total) by mouth daily. 04/12/21   Wellington Hampshire, MD  traMADol (ULTRAM) 50 MG tablet Take 50 mg by mouth every 6 (six) hours as needed. 11/07/20   [provider]  VASCEPA 1 g capsule TAKE 2 CAPSULES TWICE DAILY 07/12/20   Wellington Hampshire, MD  vitamin B-12 (CYANOCOBALAMIN) 1000 MCG tablet Take 1,000 mcg by mouth daily.    [provider]   Physical Exam: Vitals:   06/01/21 1745 06/01/21 1850 06/01/21 2200 06/01/21 2234  BP: (!) 101/50  (!) 103/52   Pulse: (!) 104 99 97   Resp: (!) 21 (!) 23 20   Temp:    100.2 F (37.9 C)  TempSrc:      SpO2: 94% 100% 95%   Weight:      Height:       Constitutional: appears age-appropriate, frail, NAD, calm, comfortable Eyes: PERRL, lids and conjunctivae normal ENMT: Mucous membranes are moist. Posterior pharynx clear of any exudate or lesions. Age-appropriate dentition. Hearing appropriate. Neck: normal, supple, no masses, no thyromegaly Respiratory: clear to auscultation bilaterally, no wheezing, no crackles. Normal respiratory effort. No accessory muscle use.  Cardiovascular: Regular rate and rhythm, no murmurs / rubs / gallops.  3+ pitting edema of the lower extremities.  2+ pedal pulses. No carotid bruits.  Abdomen: no tenderness, no masses palpated, no hepatosplenomegaly. Bowel sounds positive.  Musculoskeletal: no  clubbing / cyanosis. No joint deformity upper and lower extremities. Good ROM, no contractures, no atrophy. Normal muscle tone.  Skin: no rashes, lesions, ulcers. No induration Neurologic: Sensation intact. Strength 5/5 in all 4.  Psychiatric: Normal judgment and insight. Alert and oriented x 3. Normal mood.   EKG: independently reviewed, showing ST depression in leads II 3, aVF and V4 to V6.  Chest x-ray on Admission: I personally reviewed and I agree with radiologist reading as below.  CT HEAD WO CONTRAST (5MM)  Result Date: 06/01/2021 CLINICAL DATA:  A 71 year old male presents for evaluation of mental status change of unknown cause. Post coronary stenting in July. EXAM: CT HEAD WITHOUT CONTRAST TECHNIQUE: Contiguous axial images were obtained from the base of the skull through the vertex without intravenous contrast. RADIATION DOSE REDUCTION: This exam was performed according to the departmental dose-optimization program which includes automated exposure control, adjustment of the mA and/or kV according to patient size and/or use of iterative reconstruction technique. COMPARISON:  August 01, 2014. FINDINGS: Brain: No evidence of acute infarction, hemorrhage, hydrocephalus, extra-axial collection or mass lesion/mass effect. Signs of atrophy which has worsened since the prior study of April of 2016. Vascular: No hyperdense vessel or unexpected calcification. Skull: Normal. Negative for fracture or focal lesion. Sinuses/Orbits: Mucous retention cyst or polyp in the LEFT maxillary sinus. No substantial mucosal thickening or air-fluid level in the sinuses. Mastoid air cells are clear. Orbits  are unremarkable. Other: None. IMPRESSION: 1. No acute intracranial abnormality. 2. Signs of atrophy which has worsened since the prior study of April of 2016. Electronically Signed   By: Zetta Bills M.D.   On: 06/01/2021 18:15   CT Angio Chest PE W and/or Wo Contrast  Result Date: 06/01/2021 CLINICAL DATA:   Chest pain EXAM: CT ANGIOGRAPHY CHEST WITH CONTRAST TECHNIQUE: Multidetector CT imaging of the chest was performed using the standard protocol during bolus administration of intravenous contrast. Multiplanar CT image reconstructions and MIPs were obtained to evaluate the vascular anatomy. RADIATION DOSE REDUCTION: This exam was performed according to the departmental dose-optimization program which includes automated exposure control, adjustment of the mA and/or kV according to patient size and/or use of iterative reconstruction technique. CONTRAST:  28mL OMNIPAQUE IOHEXOL 350 MG/ML SOLN COMPARISON:  Chest x-ray 06/01/2021, CT chest 07/31/2018, 12/11/2019 FINDINGS: Cardiovascular: Satisfactory opacification of the pulmonary arteries to the segmental level. No evidence of pulmonary embolism. Moderate aortic atherosclerosis. No aneurysm. Post CABG changes. Coronary vascular calcification. Normal cardiac size. No pericardial effusion Mediastinum/Nodes: Midline trachea. No thyroid mass. No suspicious lymph nodes. Esophagus within normal limits Lungs/Pleura: No consolidation, pleural effusion or pneumothorax. Atelectasis or scar at the lingula and left base Upper Abdomen: No acute abnormality.  Hepatic steatosis Musculoskeletal: Post sternotomy changes. Mild gynecomastia. No acute osseous abnormality. Review of the MIP images confirms the above findings. IMPRESSION: 1. Negative for acute pulmonary embolus or aortic dissection. 2. No acute airspace disease. Aortic Atherosclerosis (ICD10-I70.0). Electronically Signed   By: Donavan Foil M.D.   On: 06/01/2021 18:17   CT ABDOMEN PELVIS W CONTRAST  Result Date: 06/01/2021 CLINICAL DATA:  Acute abdominal pain in a 71 year old male. EXAM: CT ABDOMEN AND PELVIS WITH CONTRAST TECHNIQUE: Multidetector CT imaging of the abdomen and pelvis was performed using the standard protocol following bolus administration of intravenous contrast. RADIATION DOSE REDUCTION: This exam was  performed according to the departmental dose-optimization program which includes automated exposure control, adjustment of the mA and/or kV according to patient size and/or use of iterative reconstruction technique. CONTRAST:  25mL OMNIPAQUE IOHEXOL 350 MG/ML SOLN COMPARISON:  March 06, 2021. FINDINGS: Lower chest: Basilar atelectasis without effusion or sign of consolidation. Hepatobiliary: Paste that Pancreas: Normal, without mass, inflammation or ductal dilatation. Spleen: Normal size and contour. Adrenals/Urinary Tract: Adrenal glands are unremarkable. Symmetric renal enhancement. No sign of hydronephrosis. No suspicious renal lesion or perinephric stranding. Urinary bladder is grossly unremarkable. Small cysts in the bilateral kidneys. Punctate calculi in the interpolar LEFT and RIGHT kidney may approach 3 mm greatest size, 2 on the LEFT and a single calculus on the RIGHT. No signs of ureteral calculus or perivesical stranding. Stomach/Bowel: Small hiatal hernia. No perigastric strain. No sign of small bowel obstruction or inflammation of the small bowel. Normal appendix. Pancolonic diverticulosis which is worse at the distal descending and sigmoid colon. Focal inflammation extending from a diverticulum at the descending/sigmoid junction with adjacent fascial thickening and pericolonic stranding extending from this area. No signs of abscess. No free intraperitoneal air. Vascular/Lymphatic: Aortic atherosclerosis. No sign of aneurysm. Smooth contour of the IVC. There is no gastrohepatic or hepatoduodenal ligament lymphadenopathy. No retroperitoneal or mesenteric lymphadenopathy. No pelvic sidewall lymphadenopathy. Reproductive: Unremarkable by CT. Other: Small fat containing umbilical hernia.  No ascites. Musculoskeletal: No acute bone finding. No destructive bone process. Spinal degenerative changes. IMPRESSION: 1. Acute uncomplicated diverticulitis at the descending/sigmoid junction. 2. Bilateral  nephrolithiasis. 3. Moderate hepatic steatosis similar to the prior study. 4. Small  hiatal hernia. 5. Small fat containing umbilical hernia. Aortic Atherosclerosis (ICD10-I70.0). Electronically Signed   By: Zetta Bills M.D.   On: 06/01/2021 18:25   DG Chest Portable 1 View  Result Date: 06/01/2021 CLINICAL DATA:  Chest pain EXAM: PORTABLE CHEST 1 VIEW COMPARISON:  11/14/2020 FINDINGS: Post sternotomy changes. Hypoventilatory change causes bronchovascular crowding. No consolidation, pleural effusion, or pneumothorax. IMPRESSION: No active disease.  Low lung volumes. Electronically Signed   By: Donavan Foil M.D.   On: 06/01/2021 17:12    Labs on Admission: I have personally reviewed following labs CBC: Recent Labs  Lab 06/01/21 1638  WBC 11.3*  NEUTROABS 9.1*  HGB 14.1  HCT 42.7  MCV 99.8  PLT 482   Basic Metabolic Panel: Recent Labs  Lab 06/01/21 1638  NA 133*  K 4.5  CL 99  CO2 22  GLUCOSE 105*  BUN 11  CREATININE 1.31*  CALCIUM 8.6*   GFR: Estimated Creatinine Clearance: 60.5 mL/min (A) (by C-G formula based on SCr of 1.31 mg/dL (H)).  Liver Function Tests: Recent Labs  Lab 06/01/21 1638  AST 30  ALT 22  ALKPHOS 70  BILITOT 1.0  PROT 6.6  ALBUMIN 4.1   Recent Labs  Lab 06/01/21 1638  LIPASE 42   Recent Labs  Lab 06/01/21 1655  AMMONIA 15   Coagulation Profile: Recent Labs  Lab 06/01/21 1638  INR 0.9   BNP (last 3 results) Recent Labs    08/07/20 1417  PROBNP 242   CBG: Recent Labs  Lab 06/01/21 1647  GLUCAP 112*   Urine analysis:    Component Value Date/Time   COLORURINE YELLOW (A) 06/01/2021 1655   APPEARANCEUR CLEAR (A) 06/01/2021 1655   APPEARANCEUR Clear 03/14/2021 1017   LABSPEC 1.021 06/01/2021 1655   PHURINE 5.0 06/01/2021 1655   GLUCOSEU >=500 (A) 06/01/2021 1655   HGBUR NEGATIVE 06/01/2021 1655   BILIRUBINUR NEGATIVE 06/01/2021 1655   BILIRUBINUR Negative 03/14/2021 1017   KETONESUR NEGATIVE 06/01/2021 1655    PROTEINUR NEGATIVE 06/01/2021 1655   NITRITE NEGATIVE 06/01/2021 1655   LEUKOCYTESUR NEGATIVE 06/01/2021 1655   CRITICAL CARE Performed by: Briant Cedar Sharen Youngren  Total critical care time: 35 minutes  Critical care time was exclusive of separately billable procedures and treating other patients.  Critical care was necessary to treat or prevent imminent or life-threatening deterioration.  Critical care was time spent personally by me on the following activities: development of treatment plan with patient and/or surrogate as well as nursing, discussions with consultants, evaluation of patient's response to treatment, examination of patient, obtaining history from patient or surrogate, ordering and performing treatments and interventions, ordering and review of laboratory studies, ordering and review of radiographic studies, pulse oximetry and re-evaluation of patient's condition.  Dr. Tobie Poet [facility]  If 7PM-7AM, please contact overnight-coverage provider If 7AM-7PM, please contact day coverage provider [email or web address]  06/01/2021, 11:11 PM

## 2021-06-01 NOTE — Assessment & Plan Note (Signed)
-   CPAP nightly ordered 

## 2021-06-01 NOTE — Progress Notes (Signed)
Daily Session Note  Patient Details  Name: Willie Clark MRN: [id number] Date of Birth: 1951/02/25 Referring Provider:   Flowsheet Row Pulmonary Rehab from 05/23/2021 in [facility] Cardiac and Pulmonary Rehab  Referring Provider [name] MD       Encounter Date: 06/01/2021  Check In:  Session Check In - 06/01/21 0926       Check-In   Supervising physician immediately available to respond to emergencies See telemetry face sheet for immediately available ER MD    Location [facility]-Cardiac & Pulmonary Rehab    Staff Present Renita Papa, RN BSN;Joseph Hodge, RCP,RRT,BSRT;Jessica Washington, [place], RCEP, CCRP, CCET    Virtual Visit No    Medication changes reported     No    Fall or balance concerns reported    No    Warm-up and Cool-down Performed on first and last piece of equipment    Resistance Training Performed Yes    VAD Patient? No    PAD/SET Patient? No      Pain Assessment   Currently in Pain? No/denies                Social History   Tobacco Use  Smoking Status Former   Packs/day: 2.50   Types: Cigarettes   Quit date: 08/21/1991   Years since quitting: 29.8  Smokeless Tobacco Never    Goals Met:  Independence with exercise equipment Exercise tolerated well No report of concerns or symptoms today Strength training completed today  Goals Unmet:  Not Applicable  Comments: Pt able to follow exercise prescription today without complaint.  Will continue to monitor for progression.    Dr. Emily Filbert is Medical Director for [place].  Dr. Ottie Glazier is Medical Director for [facility] Pulmonary Rehabilitation.

## 2021-06-02 ENCOUNTER — Encounter: Payer: Self-pay | Admitting: Internal Medicine

## 2021-06-02 DIAGNOSIS — K5792 Diverticulitis of intestine, part unspecified, without perforation or abscess without bleeding: Secondary | ICD-10-CM

## 2021-06-02 DIAGNOSIS — B951 Streptococcus, group B, as the cause of diseases classified elsewhere: Secondary | ICD-10-CM

## 2021-06-02 DIAGNOSIS — F32A Depression, unspecified: Secondary | ICD-10-CM

## 2021-06-02 DIAGNOSIS — I5042 Chronic combined systolic (congestive) and diastolic (congestive) heart failure: Secondary | ICD-10-CM | POA: Diagnosis not present

## 2021-06-02 DIAGNOSIS — A419 Sepsis, unspecified organism: Secondary | ICD-10-CM | POA: Diagnosis not present

## 2021-06-02 DIAGNOSIS — E785 Hyperlipidemia, unspecified: Secondary | ICD-10-CM

## 2021-06-02 DIAGNOSIS — I25119 Atherosclerotic heart disease of native coronary artery with unspecified angina pectoris: Secondary | ICD-10-CM | POA: Diagnosis not present

## 2021-06-02 DIAGNOSIS — I10 Essential (primary) hypertension: Secondary | ICD-10-CM | POA: Diagnosis not present

## 2021-06-02 DIAGNOSIS — R7881 Bacteremia: Secondary | ICD-10-CM

## 2021-06-02 LAB — BLOOD CULTURE ID PANEL (REFLEXED) - BCID2

## 2021-06-02 LAB — CBC WITH DIFFERENTIAL/PLATELET
Abs Immature Granulocytes: 0.35 10*3/uL — ABNORMAL HIGH (ref 0.00–0.07)
Basophils Absolute: 0 10*3/uL (ref 0.0–0.1)
Basophils Relative: 0 %
Eosinophils Absolute: 0.2 10*3/uL (ref 0.0–0.5)
Eosinophils Relative: 1 %
HCT: 38.8 % — ABNORMAL LOW (ref 39.0–52.0)
Hemoglobin: 12.8 g/dL — ABNORMAL LOW (ref 13.0–17.0)
Immature Granulocytes: 2 %
Lymphocytes Relative: 3 %
Lymphs Abs: 0.6 10*3/uL — ABNORMAL LOW (ref 0.7–4.0)
MCH: 32.2 pg (ref 26.0–34.0)
MCHC: 33 g/dL (ref 30.0–36.0)
MCV: 97.7 fL (ref 80.0–100.0)
Monocytes Absolute: 0.9 10*3/uL (ref 0.1–1.0)
Monocytes Relative: 5 %
Neutro Abs: 17.3 10*3/uL — ABNORMAL HIGH (ref 1.7–7.7)
Neutrophils Relative %: 89 %
Platelets: 136 10*3/uL — ABNORMAL LOW (ref 150–400)
RBC: 3.97 MIL/uL — ABNORMAL LOW (ref 4.22–5.81)
RDW: 13.8 % (ref 11.5–15.5)
WBC: 19.4 10*3/uL — ABNORMAL HIGH (ref 4.0–10.5)
nRBC: 0 % (ref 0.0–0.2)

## 2021-06-02 LAB — PROTIME-INR
INR: 1.1 (ref 0.8–1.2)
Prothrombin Time: 14.2 seconds (ref 11.4–15.2)

## 2021-06-02 LAB — CORTISOL-AM, BLOOD: Cortisol - AM: 13.6 ug/dL (ref 6.7–22.6)

## 2021-06-02 LAB — BASIC METABOLIC PANEL
Anion gap: 11 (ref 5–15)
BUN: 15 mg/dL (ref 8–23)
CO2: 25 mmol/L (ref 22–32)
Calcium: 8.4 mg/dL — ABNORMAL LOW (ref 8.9–10.3)
Chloride: 101 mmol/L (ref 98–111)
Creatinine, Ser: 1.26 mg/dL — ABNORMAL HIGH (ref 0.61–1.24)
GFR, Estimated: >60 mL/min (ref >60)
Glucose, Bld: 134 mg/dL — ABNORMAL HIGH (ref 70–99)
Potassium: 4.1 mmol/L (ref 3.5–5.1)
Sodium: 137 mmol/L (ref 135–145)

## 2021-06-02 LAB — MAGNESIUM: Magnesium: 1.8 mg/dL (ref 1.7–2.4)

## 2021-06-02 LAB — PROCALCITONIN: Procalcitonin: 26.17 ng/mL

## 2021-06-02 LAB — LACTIC ACID, PLASMA: Lactic Acid, Venous: 1.3 mmol/L (ref 0.5–1.9)

## 2021-06-02 LAB — HEPARIN LEVEL (UNFRACTIONATED)
Heparin Unfractionated: 0.29 IU/mL — ABNORMAL LOW (ref 0.30–0.70)
Heparin Unfractionated: 0.22 IU/mL — ABNORMAL LOW (ref 0.30–0.70)

## 2021-06-02 LAB — PHOSPHORUS: Phosphorus: 4 mg/dL (ref 2.5–4.6)

## 2021-06-02 MED ORDER — HEPARIN BOLUS VIA INFUSION
1400.0000 [IU] | Freq: Once | INTRAVENOUS | Status: AC
  Administered 2021-06-02: 1400 [IU] via INTRAVENOUS
  Filled 2021-06-02: qty 1400

## 2021-06-02 MED ORDER — OXYCODONE HCL 5 MG PO TABS
2.5000 mg | ORAL_TABLET | Freq: Four times a day (QID) | ORAL | Status: DC | PRN
Start: 2021-06-02 — End: 2021-06-05
  Administered 2021-06-02 – 2021-06-05 (×4): 2.5 mg via ORAL
  Filled 2021-06-02 (×5): qty 1

## 2021-06-02 MED ORDER — FINASTERIDE 5 MG PO TABS
5.0000 mg | ORAL_TABLET | Freq: Every day | ORAL | Status: DC
  Administered 2021-06-02 – 2021-06-05 (×4): 5 mg via ORAL
  Filled 2021-06-02 (×5): qty 1

## 2021-06-02 MED ORDER — SODIUM CHLORIDE 0.9 % IV SOLN
2.0000 g | INTRAVENOUS | Status: DC
  Administered 2021-06-02 – 2021-06-05 (×4): 2 g via INTRAVENOUS
  Filled 2021-06-02 (×3): qty 20
  Filled 2021-06-02: qty 2

## 2021-06-02 MED ORDER — GABAPENTIN 400 MG PO CAPS
800.0000 mg | ORAL_CAPSULE | Freq: Three times a day (TID) | ORAL | Status: DC
  Administered 2021-06-02 – 2021-06-05 (×9): 800 mg via ORAL
  Filled 2021-06-02 (×9): qty 2

## 2021-06-02 MED ORDER — CARVEDILOL 3.125 MG PO TABS
3.1250 mg | ORAL_TABLET | Freq: Two times a day (BID) | ORAL | Status: DC
  Administered 2021-06-02 – 2021-06-05 (×6): 3.125 mg via ORAL
  Filled 2021-06-02 (×7): qty 1

## 2021-06-02 NOTE — Assessment & Plan Note (Addendum)
Appears euvolemic. Repeat Echo shows unchanged EF 40-45% and grade I DD Has some leg swelling, net positive today, swelling no change. - Continue IV lasix, increase dose - Resume spironolacotne  - Continue Coreg - Hold Jardiance, losartan, torsemide for now - I/Os - Daily BMP

## 2021-06-02 NOTE — ED Notes (Signed)
Lab called to collect morning labwork

## 2021-06-02 NOTE — Assessment & Plan Note (Signed)
-   Continue citalopram 

## 2021-06-02 NOTE — Consult Note (Signed)
[place] for heparin Indication: chest pain/ACS  Allergies  Allergen Reactions   Amitriptyline Other (See Comments)    Pt unsure of what happens Other reaction(s): Other (See Comments) Pt unsure of what happens Pt unsure of what happens   Cyclobenzaprine Other (See Comments) and Hives    Pt unsure what hapens Pt unsure what hapens Pt unsure what hapens    Dicyclomine Shortness Of Breath and Swelling   Sacubitril-Valsartan Anaphylaxis and Itching   Amitriptyline Hcl    Berniece Salines Flavor Diarrhea   Hydrocodone    Fish Oil Itching   Lidocaine Nausea And Vomiting, Other (See Comments) and Nausea Only    Nasal Spray (only time had reaction) Other reaction(s): Dizziness Nasal Spray (only time had reaction)   Omega-3 Fatty Acids-Vitamin E Itching   Other Diarrhea and Itching    Reactive agents: Onions, bacon, steak   Oxycodone-Acetaminophen Other (See Comments)    Lowers BP Other reaction(s): Other (See Comments) Lowers BP Lowers BP   Sulfa Antibiotics Itching    Patient Measurements: Height: 5\' 10"  (177.8 cm) Weight: 94.2 kg (207 lb 10.8 oz) IBW/kg (Calculated) : 73 Heparin Dosing Weight: 92.1 kg  Vital Signs: Temp: 98.8 F (37.1 C) (02/11 0258) Temp Source: Oral (02/11 0137) BP: 94/60 (02/11 0600) Pulse Rate: 86 (02/11 0600)  Labs: Recent Labs    06/01/21 1638 06/01/21 1836 06/02/21 0613  HGB 14.1  --  12.8*  HCT 42.7  --  38.8*  PLT 176  --  136*  APTT 26  --   --   LABPROT 12.0  --  14.2  INR 0.9  --  1.1  HEPARINUNFRC  --   --  0.29*  CREATININE 1.31*  --   --   TROPONINIHS 14 139*  --      Estimated Creatinine Clearance: 60.5 mL/min (A) (by C-G formula based on SCr of 1.31 mg/dL (H)).   Medical History: Past Medical History:  Diagnosis Date   Anxiety    Arthritis    Barrett's esophagus    Bowel obstruction ([place]) 02/2009   small bowel   CHF (congestive heart failure) (HCC)    Chronic chest pain     Coronary artery disease    a. 1993 CABG; b. 2007 Redo CABG; c. 09/2015 Cath: LM mild dzs, LAD 100ost, RI mild dzs, LCX 60p, OM1 100, RCA 100ost, VG->OM2 min irregs, LIMA->LAD nl, VG->RPDA 30ost, 20d, nl EF->Med rx.   Depression    Diverticulitis    Diverticulosis    Erectile dysfunction    Fibromyalgia    Gastric ulcer    Gastritis    GERD (gastroesophageal reflux disease)    Headache    Hemorrhoids    HFrEF (heart failure with reduced ejection fraction) ([place])    a. 09/2016 Echo: EF 55-60%, no rwma, triv AI, mild MR, mildly dil LA. -> as of April 2020: EF 40 to 45%.  Septal dyssynergy/hypokinesis due to postop state but otherwise unable to assess wall motion [name] due to poor study.   History of myocardial infarct at age less than 37 years    Hyperlipidemia    Hypertension    Kidney stones    going to [facility]  June 26 to see kidney specialist   Lightheadedness    Mass on back    Nephrolithiasis    Orthostatic hypotension    a. Improved after discontinuation of metoprolol.   OSA (obstructive sleep apnea)    a. On CPAP.  Pulmonary embolism ([place]) 01/2006   Post-op/treated   Skin lesions, generalized    Stroke ([place]) 1993   right side weakness no blood thinners   on Aspirin   Syncope and collapse    Urticaria     Medications:  No PTA anticoagulation, only Plavix 75 mg and aspirin 81 mg daily  Assessment: 71 y.o. male with CAD and CHF presents to the ED with chest pain. Troponin elevated to 139. Pharmacy has been consulted for heparin dosing for ACS.   Baseline labs: H/H and plts WNL, aPTT 26, INR 0.9  Goal of Therapy:  Heparin level 0.3-0.7 units/ml Monitor platelets by anticoagulation protocol: Yes  2/11 0613 HL 0.29, subtherapeutic    Plan:  Increase heparin infusion to 1400 units/hr Recheck HL in 8 hr after rate change CBC daily while on heparin.  Renda Rolls, PharmD, [facility] 06/02/2021 6:40 AM

## 2021-06-02 NOTE — Progress Notes (Signed)
Progress Note   Patient: DIXON LUCZAK EPP:[id number] DOB: 25-Jun-1950 DOA: 06/01/2021     1 DOS: the patient was seen and examined on 06/02/2021       Brief hospital course: Mr. Govoni is a 71 y.o. M with CAD s/p PCI most recently 8 months ago, OSA on CPAP, ulcerative colitis, diverticulitis, hx PE no longer on AC, HTN, and sCHF EF 40-45% who presented with chest pain.  In the ER, also noted to have leukocytosis, tachycardia and abdominal pain, and CT showed acute diverticulitis.  CTA chest showed no pneumonia or PE.  Troponin became elevated, and he was started on heparin for NSTEMI after discussion with Cardiology.  He developed somnolence, which improved some after fluids and antibiotics.       Assessment and Plan: * Severe sepsis ([place])- (present on admission) Presented with fever, tachycardia, elevated lactic acid, encephalopathy (somnolence, generalized weakness), myocardial ischemia. -See above  NSTEMI (non-ST elevated myocardial infarction) ([place])- (present on admission) Presented with chest pain, troponin delta.  Known multivessel coronary disease.  Unclear at this time if this is a provoked ACS, versus demand ischemia - Heparin gtt - Consult cardiology - Continue aspirin, Plavix, Coreg, Crestor  Diverticulitis- (present on admission) Patient has known severe recurrent diverticulitis. At this time CT imaging shows diverticulitis, he has left lower quadrant tenderness, fever, abdominal pain, and group B strep bacteremia - Continue IV Rocephin and Flagyl - Obtain echocardiogram  - Hold mesalamine, used for diverticulitis  Bacteremia due to group B Streptococcus -Obtain echocardiogram - Continue antibiotics  OSA on CPAP - CPAP at night  GERD (gastroesophageal reflux disease)- (present on admission) - Hold pantoprazole  Depression- (present on admission) - Continue citalopram  Essential hypertension- (present on admission) Blood pressure low overnight -  Continue Coreg -Hold spironolactone, torsemide, losartan, Imdur until hemodynamics are clear  Chronic combined systolic and diastolic congestive heart failure (HCC)- (present on admission) Appears euvolemic - Continue Coreg - Hold Jardiance, losartan, spironolactone, torsemide given hypotension  Coronary artery disease involving native coronary artery of native heart with angina pectoris ([place])- (present on admission) - Continue aspirin, Plavix, Coreg, Crestor        Subjective: Patient still has abdominal pain, malaise, fatigue.  No confusion.  Still has fever.  No vomiting.  No hematochezia.  Has mild right-sided chest discomfort, no clear anginal symptoms.  Physical Exam: Vitals:   06/02/21 0654 06/02/21 0700 06/02/21 0715 06/02/21 0730  BP: 107/64 (!) 102/58 105/61 (!) 102/54  Pulse: 86 77 79 82  Resp:      Temp:      TempSrc:      SpO2: 94% 92% 94% 96%  Weight:      Height:       Elderly adult male, lying in bed, appears weak and tired. RRR, no murmurs, 1+ bilateral lower extremity edema to the shin Normal respiratory rate and rhythm, lungs clear without rales or wheezes No abdominal tenderness on the right, tenderness to deep palpation of the left, no rigidity or rebound Awake and alert, moves upper extremities with generalized weakness but normal coordination, speech fluent, face symmetric Attention normal, responds appropriately, oriented to person, place, and time, judgment and insight appear normal, affect very blunted by fatigue and malaise      Data Reviewed: Discussed with cardiology Labs and imaging, reviewed by me, notable for CT angiogram of the chest without pneumonia or pulmonary embolism CT abdomen and pelvis showing sigmoid diverticulitis without abscess or perforation White blood cell count elevated  on complete blood count Lactic acid elevated but improving Patient metabolic panel with normal electrolytes, creatinine 1.2, baseline Echocardiogram  pending    Family Communication: Wife at the bedside  Disposition: Status is: Inpatient Remains inpatient appropriate because: He requires ongoing IV antibiotics for severe sepsis with strep bacteremia, from diverticulitis source, and possible in-stent          Planned Discharge Destination:  TBD        Author: Edwin Dada, MD 06/02/2021 8:46 AM  For on call review [email or web address].

## 2021-06-02 NOTE — Consult Note (Signed)
[place] for heparin Indication: chest pain/ACS  Allergies  Allergen Reactions   Amitriptyline Other (See Comments)    Pt unsure of what happens Other reaction(s): Other (See Comments) Pt unsure of what happens Pt unsure of what happens   Cyclobenzaprine Other (See Comments) and Hives    Pt unsure what hapens Pt unsure what hapens Pt unsure what hapens    Dicyclomine Shortness Of Breath and Swelling   Sacubitril-Valsartan Anaphylaxis and Itching   Amitriptyline Hcl    Berniece Salines Flavor Diarrhea   Hydrocodone    Fish Oil Itching   Lidocaine Nausea And Vomiting, Other (See Comments) and Nausea Only    Nasal Spray (only time had reaction) Other reaction(s): Dizziness Nasal Spray (only time had reaction)   Omega-3 Fatty Acids-Vitamin E Itching   Other Diarrhea and Itching    Reactive agents: Onions, bacon, steak   Oxycodone-Acetaminophen Other (See Comments)    Lowers BP Other reaction(s): Other (See Comments) Lowers BP Lowers BP   Sulfa Antibiotics Itching    Patient Measurements: Height: 5\' 10"  (177.8 cm) Weight: 94.2 kg (207 lb 10.8 oz) IBW/kg (Calculated) : 73 Heparin Dosing Weight: 92.1 kg  Vital Signs: Temp: 98.4 F (36.9 C) (02/11 1409) Temp Source: Oral (02/11 1409) BP: 112/66 (02/11 1409) Pulse Rate: 80 (02/11 1409)  Labs: Recent Labs    06/01/21 1638 06/01/21 1836 06/02/21 0613 06/02/21 1500  HGB 14.1  --  12.8*  --   HCT 42.7  --  38.8*  --   PLT 176  --  136*  --   APTT 26  --   --   --   LABPROT 12.0  --  14.2  --   INR 0.9  --  1.1  --   HEPARINUNFRC  --   --  0.29* 0.22*  CREATININE 1.31*  --  1.26*  --   TROPONINIHS 14 139*  --   --      Estimated Creatinine Clearance: 62.9 mL/min (A) (by C-G formula based on SCr of 1.26 mg/dL (H)).   Medical History: Past Medical History:  Diagnosis Date   Abnormal stress test    Anxiety    Arthritis    Barrett's esophagus    Bowel obstruction ([place]) 02/2009    small bowel   CHF (congestive heart failure) (HCC)    Chronic chest pain    Coronary artery disease    a. 1993 CABG; b. 2007 Redo CABG; c. 09/2015 Cath: LM mild dzs, LAD 100ost, RI mild dzs, LCX 60p, OM1 100, RCA 100ost, VG->OM2 min irregs, LIMA->LAD nl, VG->RPDA 30ost, 20d, nl EF->Med rx.   Depression    Diverticulitis    Diverticulosis    Erectile dysfunction    Fibromyalgia    Gastric ulcer    Gastritis    GERD (gastroesophageal reflux disease)    Headache    Hemorrhoids    HFrEF (heart failure with reduced ejection fraction) ([place])    a. 09/2016 Echo: EF 55-60%, no rwma, triv AI, mild MR, mildly dil LA. -> as of April 2020: EF 40 to 45%.  Septal dyssynergy/hypokinesis due to postop state but otherwise unable to assess wall motion [name] due to poor study.   History of myocardial infarct at age less than 43 years    Hyperlipidemia    Hypertension    Kidney stones    going to [facility]  June 26 to see kidney specialist   Lightheadedness    Mass on  back    Nephrolithiasis    Orthostatic hypotension    a. Improved after discontinuation of metoprolol.   OSA (obstructive sleep apnea)    a. On CPAP.   Pulmonary embolism ([place]) 01/2006   Post-op/treated   Skin lesions, generalized    Stroke [facility]) 1993   right side weakness no blood thinners   on Aspirin   Syncope and collapse    TIA (transient ischemic attack) 08/13/2014   Urticaria     Medications:  No PTA anticoagulation, only Plavix 75 mg and aspirin 81 mg daily  Assessment: 71 y.o. male with CAD and CHF presents to the ED with chest pain. Troponin elevated to 139. Pharmacy has been consulted for heparin dosing for ACS.   Baseline labs: H/H and plts WNL, aPTT 26, INR 0.9  Goal of Therapy:  Heparin level 0.3-0.7 units/ml Monitor platelets by anticoagulation protocol: Yes   Date Time HL  Rate/comment 2/11  0613  0.29     subtherapeutic; 1200 units/hr 2/11     1500  0.22      subtherapeutic; 1400  units/hr   Plan:  Heparin bolus 1400 units x 1 Increase heparin infusion to 1600 units/hr Recheck HL in 8 hr after rate change CBC daily    Wynelle Cleveland, PharmD Pharmacy Resident  06/02/2021 3:37 PM

## 2021-06-02 NOTE — Assessment & Plan Note (Addendum)
Good quality echo shows no signs of vegetation.   - Continue Rocephin - Repeat BCx

## 2021-06-02 NOTE — Progress Notes (Signed)
Arrived to unit, placed on cardiac monitor. Oriented to unit and safety measures.

## 2021-06-02 NOTE — ED Notes (Signed)
Pt A&Ox4, NAD, calm, interactive, speech clear, resting on R side. Wife at [facility]. Denies questions or needs, sx or complaints.

## 2021-06-02 NOTE — Assessment & Plan Note (Addendum)
Patient has known severe recurrent diverticulitis. At this time CT imaging shows diverticulitis, he has left lower quadrant tenderness, fever, abdominal pain, and group B strep bacteremia - Continue IV Rocephin and Flagyl  - Hold mesalamine, used for diverticulitis

## 2021-06-02 NOTE — Assessment & Plan Note (Signed)
-   Continue aspirin, Plavix, Coreg, Crestor

## 2021-06-02 NOTE — Progress Notes (Signed)
PHARMACY - PHYSICIAN COMMUNICATION CRITICAL VALUE ALERT - BLOOD CULTURE IDENTIFICATION (BCID)  BCID results 1 set (both bottles) of 2 with Streptococcus agalactiae (Group B).  Elevated WBC, Max Temp 103.2 in past 24hrs.  1st line recommendation is Pen G.  Pt currently on Vancomycin (2nd line therapy), Azithromycin, Cefepime, and Flagyl for concurrent L lower lobe pneumonia.  Name of provider contacted: Rachael Fee, NP  Changes to prescribed antibiotics required: No changes at this time.  Possible multi-microbial pneumonia suspected.  Renda Rolls, PharmD, [facility] 06/02/2021 4:31 AM

## 2021-06-02 NOTE — Hospital Course (Addendum)
Willie Clark is a 71 y.o. M with CAD s/p PCI most recently 8 months ago, OSA on CPAP, ulcerative colitis, diverticulitis, hx PE no longer on AC, HTN, and sCHF EF 40-45% who presented with chest pain.  In the ER, also noted to have leukocytosis, tachycardia and abdominal pain, and CT showed acute diverticulitis.  CTA chest showed no pneumonia or PE.  Troponin became elevated, and he was started on heparin for NSTEMI after discussion with Cardiology.  He developed somnolence, which improved some after fluids and antibiotics.   2/11: 1/2 blood culture positive for GBS 2/12: No fever, clinically improving, no further CP

## 2021-06-02 NOTE — Consult Note (Signed)
Cardiology Consultation:   Patient ID: JONOVAN BOEDECKER; [id number]; Jul 12, 1950   Admit date: 06/01/2021 Date of Consult: 06/02/2021  Primary Care Provider: [facility] [place] Primary Cardiologist: Fletcher Anon Primary Electrophysiologist:  None   Patient Profile:   CHRISTORPHER HISAW is a 71 y.o. male with a hx of CAD s/p CABG in 1993 with redo in 2007 s/p subsequent PCI's as outlined below, HFrEF secondary to ICM, fibromyalgia, chronic pain, gastric ulcer, HTN, HLD, OSA on CPAP, postoperative PE s/p therapy, CVA, SBO, Barrett's esophagus, diverticulitis, hemorrhoids, nephrolithiasis, orthostatic hypotension, and cervical spine pain s/p prior surgeries  who is being seen today for the evaluation of elevated troponin at the request of Dr. Tobie Poet.  History of Present Illness:   Mr. Olivo was admitted to the hospital 11/2019 with angina.  Echo showed an EF of 40 to 45%.  LHC showed severe three-vessel CAD with patent grafts and no significant change with comparison to cath in 2017.  He was medically managed and GDMT was optimized.  However, he had an allergic reaction to [facility] with rash and pruritus that required treatment in the ED.  He was seen in the office in 06/2020 with unstable angina.  EKG was worrisome for inferior ST segment elevation MI.  He underwent emergent LHC that showed severe native three-vessel CAD with patent LCx stent with mild to moderate ISR, widely patent LIMA to LAD, patent SVG to OM 2 with 60 to 70% proximal graft stenosis and subtotally occluded SVG to RCA with a long segment of proximal disease and a 99% stenosis of the distal anastomosis.  He underwent successful PCI to the SVG to RCA with overlapping DES.  Echo during that admission showed an EF of 35 to 40%.  He was admitted in 10/2020 with increased shortness of breath and chest pain.  R/LHC showed severe three-vessel CAD including CTO of the ostial LAD, OM1, and proximal RCA.  Patent LCx stent with moderate  in-stent restenosis estimated at approximately 50% and similar to prior cardiac caths.  Widely patent LIMA to LAD.  Patent SVG to RPDA with patent proximal/mid graft stents that demonstrated mild in-stent restenosis estimated at 10%.  Distal anastomotic stent was widely patent.  There was a significant ectasia in the midportion of the graft.  Patent SVG to OM with sequential 75% and 60% ostial and mid graft stenoses.  Upper normal to mildly elevated left and right heart filling pressures.  Normal pulmonary pressure.  Mildly reduced cardiac output/index.  He underwent successful PCI/DES to the SVG to OM with 2 nonoverlapping stents.  Echo during that admission demonstrated an EF of 40 to 45%, mild global hypokinesis with moderate anteroseptal wall hypokinesis and inferior wall hypokinesis, normal RV systolic function, ventricular cavity size, and RVSP, mildly dilated left atrium, and mild mitral regurgitation.  Following the above intervention, he continues to have shortness of breath leading his Brilinta to be transition to clopidogrel.  Medications were optimized in follow-up.  He was last seen in the office in 02/2021 and reported his shortness of breath initially improved after transitioning Brilinta to clopidogrel, though this returned and was overall stable.  He noted stable chest discomfort.  He presented to [facility] on 06/01/2021 with chest pain.  Chest x-ray showed no active disease with low lung volumes.  CT head showed no acute intracranial abnormality.  CTA chest was negative for PE or aortic dissection.  ET abdomen/pelvis showed acute uncomplicated diverticulitis at the descending/sigmoid junction, bilateral nephrolithiasis, moderate  hepatic steatosis, small hiatal hernia, and a small fat-containing umbilical hernia.  He has been admitted for severe sepsis with H&P indicating possible left lower lobe pneumonia etiology.  BP has ranged from the 90s to 1 teens systolic.  Tmax 103.2.  Oxygen saturation in the  low to mid 90s%.  He was noted to have a mildly elevated high-sensitivity troponin, initially normal at 14 with a delta troponin and current peak of 139.  BNP normal at 90.  Lactic acid elevated at 2.8.  Procalcitonin elevated at 1.63 trending to 26.17.  WBC 11.3 trending to 19.4.  Initial serum creatinine 1.31 trending to 1.26 with a baseline around 0.9-1.1.  He has been initiated on a heparin drip for elevated troponin.  Currently, his chest pain has resolved.  He noted on the day of presentation he initially felt well and suddenly started feeling ill.  He called his wife who subsequently called EMS.  He notes that he is not feeling like he has an the setting of prior need for intervention on his coronaries.  He has had a little more edema than usual lately.  He denies orthopnea or PND.  His breathing is stable.    Past Medical History:  Diagnosis Date   Abnormal stress test    Anxiety    Arthritis    Barrett's esophagus    Bowel obstruction ([place]) 02/2009   small bowel   CHF (congestive heart failure) (HCC)    Chronic chest pain    Coronary artery disease    a. 1993 CABG; b. 2007 Redo CABG; c. 09/2015 Cath: LM mild dzs, LAD 100ost, RI mild dzs, LCX 60p, OM1 100, RCA 100ost, VG->OM2 min irregs, LIMA->LAD nl, VG->RPDA 30ost, 20d, nl EF->Med rx.   Depression    Diverticulitis    Diverticulosis    Erectile dysfunction    Fibromyalgia    Gastric ulcer    Gastritis    GERD (gastroesophageal reflux disease)    Headache    Hemorrhoids    HFrEF (heart failure with reduced ejection fraction) ([place])    a. 09/2016 Echo: EF 55-60%, no rwma, triv AI, mild MR, mildly dil LA. -> as of April 2020: EF 40 to 45%.  Septal dyssynergy/hypokinesis due to postop state but otherwise unable to assess wall motion [name] due to poor study.   History of myocardial infarct at age less than 1 years    Hyperlipidemia    Hypertension    Kidney stones    going to [facility]  June 26 to see kidney specialist    Lightheadedness    Mass on back    Nephrolithiasis    Orthostatic hypotension    a. Improved after discontinuation of metoprolol.   OSA (obstructive sleep apnea)    a. On CPAP.   Pulmonary embolism ([place]) 01/2006   Post-op/treated   Skin lesions, generalized    Stroke [facility]) 1993   right side weakness no blood thinners   on Aspirin   Syncope and collapse    TIA (transient ischemic attack) 08/13/2014   Urticaria     Past Surgical History:  Procedure Laterality Date   BACK SURGERY     bowel obstruction  01/2009   CARDIAC CATHETERIZATION  11/2006   patent grafts. Significant OM3 disease and occluded diagonals.   CARDIAC CATHETERIZATION  06/2010   patent grafts. significant ISR in proximal LCX but OM2 is bypassed and gives retrograde flow to OM3.    CARDIAC CATHETERIZATION  06/2013   [facility]: Patent  grafts with 60% proximal in-stent restenosis in the left circumflex with FFR of 0.85   CARDIAC CATHETERIZATION Left 09/25/2015   Procedure: Left Heart Cath and Cors/Grafts Angiography;  Surgeon: Wellington Hampshire, MD;  Location: [place] [facility];  Service: Cardiovascular;  Laterality: Left;   CERVICAL FUSION     CHOLECYSTECTOMY     COLON SURGERY  01/2009   BOWEL RESECTION DUE TO SMALL BOWEL OBSTRUCTION   COLONOSCOPY     COLONOSCOPY WITH PROPOFOL N/A 10/28/2017   Procedure: COLONOSCOPY WITH PROPOFOL;  Surgeon: Lollie Sails, MD;  Location: [facility] ENDOSCOPY;  Service: Endoscopy;  Laterality: N/A;   CORONARY ANGIOPLASTY  2006   CORONARY ARTERY BYPASS GRAFT  1993/01/2006   redo at [facility] LIMA to LAD, SVG to OM2 and SVG to RPDA   CORONARY STENT INTERVENTION N/A 06/20/2020   Procedure: CORONARY STENT INTERVENTION;  Surgeon: Nelva Bush, MD;  Location: [place] [facility];  Service: Cardiovascular;  Laterality: N/A;   CORONARY STENT INTERVENTION N/A 11/15/2020   Procedure: CORONARY STENT INTERVENTION;  Surgeon: Nelva Bush, MD;  Location: [place] [facility];  Service: Cardiovascular;   Laterality: N/A;   CYSTOSCOPY WITH STENT PLACEMENT Bilateral 09/24/2015   Procedure: CYSTOSCOPY WITH BILATERAL RETROGRADES, BILATERAL STENT PLACEMENT;  Surgeon: Festus Aloe, MD;  Location: [facility] ORS;  Service: Urology;  Laterality: Bilateral;   ESOPHAGOGASTRODUODENOSCOPY N/A 10/25/2014   Procedure: ESOPHAGOGASTRODUODENOSCOPY (EGD);  Surgeon: Lollie Sails, MD;  Location: [facility] ENDOSCOPY;  Service: Endoscopy;  Laterality: N/A;   ESOPHAGOGASTRODUODENOSCOPY (EGD) WITH PROPOFOL N/A 02/03/2017   Procedure: ESOPHAGOGASTRODUODENOSCOPY (EGD) WITH PROPOFOL;  Surgeon: Lollie Sails, MD;  Location: [facility] ENDOSCOPY;  Service: Endoscopy;  Laterality: N/A;   ESOPHAGOGASTRODUODENOSCOPY (EGD) WITH PROPOFOL N/A 04/18/2017   Procedure: ESOPHAGOGASTRODUODENOSCOPY (EGD) WITH PROPOFOL;  Surgeon: Lollie Sails, MD;  Location: [facility] ENDOSCOPY;  Service: Endoscopy;  Laterality: N/A;   EYE SURGERY Bilateral    Cataract Extraction with IOL   HERNIA REPAIR     INGUINAL HERNIA REPAIR Bilateral 01/23/2016   Procedure: HERNIA REPAIR INGUINAL ADULT BILATERAL;  Surgeon: Leonie Green, MD;  Location: [facility] ORS;  Service: General;  Laterality: Bilateral;   LEFT HEART CATH AND CORS/GRAFTS ANGIOGRAPHY N/A 12/13/2019   Procedure: LEFT HEART CATH AND CORS/GRAFTS ANGIOGRAPHY;  Surgeon: Wellington Hampshire, MD;  Location: [place] [facility];  Service: Cardiovascular;  Laterality: N/A;   LEFT HEART CATH AND CORS/GRAFTS ANGIOGRAPHY N/A 06/20/2020   Procedure: LEFT HEART CATH AND CORS/GRAFTS ANGIOGRAPHY;  Surgeon: Nelva Bush, MD;  Location: [place] [facility];  Service: Cardiovascular;  Laterality: N/A;   LUMBAR LAMINECTOMY/DECOMPRESSION MICRODISCECTOMY Left 10/01/2016   Procedure: Left Lumbar four-five Laminotomy for resection of synovial cyst;  Surgeon: Erline Levine, MD;  Location: [place];  Service: Neurosurgery;  Laterality: Left;  left   NECK SURGERY  06/2009   RIGHT/LEFT HEART CATH AND CORONARY/GRAFT ANGIOGRAPHY  N/A 11/15/2020   Procedure: RIGHT/LEFT HEART CATH AND CORONARY/GRAFT ANGIOGRAPHY;  Surgeon: Nelva Bush, MD;  Location: [place] [facility];  Service: Cardiovascular;  Laterality: N/A;   SHOULDER SURGERY  2010     Home Meds: Prior to Admission medications   Medication Sig Start Date End Date Taking? Authorizing Provider  aspirin EC 81 MG EC tablet Take 1 tablet (81 mg total) by mouth daily. Swallow whole. 06/23/20  Yes Nolberto Hanlon, MD  carvedilol (COREG) 3.125 MG tablet TAKE 1 TABLET (3.125 MG TOTAL) BY MOUTH 2 (TWO) TIMES DAILY WITH A MEAL. 12/28/20  Yes Wellington Hampshire, MD  citalopram (  CELEXA) 20 MG tablet Take 20 mg by mouth daily.    Yes [provider]  clopidogrel (PLAVIX) 75 MG tablet TAKE (1) TABLET 75 MG DAILY. 04/12/21  Yes Wellington Hampshire, MD  docusate calcium (SURFAK) 240 MG capsule Take 240 mg by mouth daily.   Yes [provider]  empagliflozin (JARDIANCE) 10 MG TABS tablet Take 1 tablet (10 mg total) by mouth daily before breakfast. 04/12/21  Yes Wellington Hampshire, MD  fexofenadine (ALLEGRA) 180 MG tablet Take 180 mg by mouth daily.   Yes [provider]  finasteride (PROSCAR) 5 MG tablet Take 5 mg by mouth daily.   Yes [provider]  furosemide (LASIX) 20 MG tablet Take 20 mg by mouth daily. 11/02/20  Yes [provider]  gabapentin (NEURONTIN) 800 MG tablet Take 1 tablet by mouth 3 (three) times daily. 12/22/19  Yes [provider]  isosorbide mononitrate (IMDUR) 60 MG 24 hr tablet Take 1 tablet (60 mg total) by mouth daily. 11/06/20  Yes Furth, Cadence H, PA-C  KLOR-CON M20 20 MEQ tablet TAKE 1 TABLET BY MOUTH EVERY DAY 05/30/21  Yes Wellington Hampshire, MD  losartan (COZAAR) 25 MG tablet TAKE 1 TABLET EVERY DAY 03/19/21  Yes Wellington Hampshire, MD  mesalamine (APRISO) 0.375 g 24 hr capsule Take 1.4 mg by mouth daily.   Yes [provider]  Multiple Vitamins-Minerals (HAIR SKIN AND NAILS FORMULA PO) Take 1 capsule  by mouth daily.   Yes [provider]  NALTREXONE HCL PO Take 2.5 mg by mouth daily.   Yes [provider]  nystatin-triamcinolone ointment (MYCOLOG) Apply 1 application topically 2 (two) times daily. 03/14/21  Yes Billey Co, MD  pantoprazole (PROTONIX) 40 MG tablet Take 40 mg by mouth 2 (two) times daily.    Yes [provider]  Peppermint Oil (IBGARD PO) Take by mouth daily at 2 am.   Yes [provider]  Probiotic Product (PROBIOTIC PO) Take by mouth daily at 8 pm.   Yes [provider]  rOPINIRole (REQUIP) 1 MG tablet Take 1 mg by mouth 2 (two) times daily. 05/18/20  Yes [provider]  rosuvastatin (CRESTOR) 40 MG tablet TAKE 1 TABLET BY MOUTH EVERY DAY 12/18/20  Yes Loel Dubonnet, NP  spironolactone (ALDACTONE) 25 MG tablet TAKE 1 TABLET EVERY DAY 12/28/20  Yes Wellington Hampshire, MD  torsemide (DEMADEX) 20 MG tablet Take 2 tablets (40 mg total) by mouth daily. 04/12/21  Yes Wellington Hampshire, MD  VASCEPA 1 g capsule TAKE 2 CAPSULES TWICE DAILY 07/12/20  Yes Wellington Hampshire, MD  vitamin B-12 (CYANOCOBALAMIN) 1000 MCG tablet Take 1,000 mcg by mouth daily.   Yes [provider]  albuterol (PROVENTIL HFA;VENTOLIN HFA) 108 (90 Base) MCG/ACT inhaler Inhale into the lungs every 4 (four) hours as needed for wheezing or shortness of breath.    [provider]  dicyclomine (BENTYL) 10 MG capsule TAKE 1 CAPSULE THREE TIMES DAILY AS NEEDED 09/28/20   [provider]  EPINEPHrine 0.3 mg/0.3 mL IJ SOAJ injection Inject 0.3 mg into the muscle as needed.  12/27/19   [provider]  nitroGLYCERIN (NITROSTAT) 0.4 MG SL tablet Place 1 tablet (0.4 mg total) under the tongue every 5 (five) minutes as needed for chest pain. 06/22/20   Nolberto Hanlon, MD  ondansetron (ZOFRAN) 4 MG tablet Take 4 mg by mouth every 8 (eight) hours as needed for nausea or vomiting.  [provider]  oxyCODONE (OXY IR/ROXICODONE) 5 MG  immediate release tablet 0.5-1 mg every 6 (six) hours as needed. 02/07/21   [provider]  traMADol (ULTRAM) 50 MG tablet Take 50 mg by mouth every 6 (six) hours as needed. 11/07/20   [provider]    Inpatient Medications: Scheduled Meds:  aspirin EC  81 mg Oral Daily   citalopram  20 mg Oral Daily   clopidogrel  75 mg Oral Daily   gabapentin  800 mg Oral TID   icosapent Ethyl  2 g Oral BID   rOPINIRole  1 mg Oral BID   rosuvastatin  40 mg Oral Daily   Continuous Infusions:  cefTRIAXone (ROCEPHIN)  IV     heparin 1,400 Units/hr (06/02/21 0654)   metronidazole Stopped (06/02/21 0733)   PRN Meds: acetaminophen **OR** acetaminophen, dicyclomine, labetalol, nitroGLYCERIN, ondansetron **OR** ondansetron (ZOFRAN) IV  Allergies:   Allergies  Allergen Reactions   Amitriptyline Other (See Comments)    Pt unsure of what happens Other reaction(s): Other (See Comments) Pt unsure of what happens Pt unsure of what happens   Cyclobenzaprine Other (See Comments) and Hives    Pt unsure what hapens Pt unsure what hapens Pt unsure what hapens    Dicyclomine Shortness Of Breath and Swelling   Sacubitril-Valsartan Anaphylaxis and Itching   Amitriptyline Hcl    Berniece Salines Flavor Diarrhea   Hydrocodone    Fish Oil Itching   Lidocaine Nausea And Vomiting, Other (See Comments) and Nausea Only    Nasal Spray (only time had reaction) Other reaction(s): Dizziness Nasal Spray (only time had reaction)   Omega-3 Fatty Acids-Vitamin E Itching   Other Diarrhea and Itching    Reactive agents: Onions, bacon, steak   Oxycodone-Acetaminophen Other (See Comments)    Lowers BP Other reaction(s): Other (See Comments) Lowers BP Lowers BP   Sulfa Antibiotics Itching    Social History:   Social History   Socioeconomic History   Marital status: Married    Spouse name: Not on file   Number of children: Not on file   Years of education: Not on file   Highest education level: Not on  file  Occupational History   Not on file  Tobacco Use   Smoking status: Former    Packs/day: 2.50    Types: Cigarettes    Quit date: 08/21/1991    Years since quitting: 29.8   Smokeless tobacco: Never  Vaping Use   Vaping Use: Never used  Substance and Sexual Activity   Alcohol use: Not Currently   Drug use: No   Sexual activity: Not on file  Other Topics Concern   Not on file  Social History Narrative   Not on file   Social Determinants of Health   Financial Resource Strain: Not on file  Food Insecurity: Not on file  Transportation Needs: Not on file  Physical Activity: Not on file  Stress: Not on file  Social Connections: Not on file  Intimate Partner Violence: Not on file     Family History:   Family History  Problem Relation Age of Onset   Heart disease Father 16   Heart attack Father    Breast cancer Paternal Aunt     ROS: As per HPI ROS    Physical Exam/Data:   Vitals:   06/02/21 0654 06/02/21 0700 06/02/21 0715 06/02/21 0730  BP: 107/64 (!) 102/58 105/61 (!) 102/54  Pulse: 86 77 79 82  Resp:  Temp:      TempSrc:      SpO2: 94% 92% 94% 96%  Weight:      Height:        Intake/Output Summary (Last 24 hours) at 06/02/2021 0815 Last data filed at 06/02/2021 0733 Gross per 24 hour  Intake 100 ml  Output --  Net 100 ml   Filed Weights   06/01/21 1632  Weight: 94.2 kg   Body mass index is 29.8 kg/m.   Physical Exam: General: Well developed, well nourished, in no acute distress. Head: Normocephalic, atraumatic, sclera non-icteric, no xanthomas, nares without discharge.  Neck: Negative for carotid bruits. JVD not elevated. Lungs: Clear bilaterally to auscultation without wheezes, rales, or rhonchi. Breathing is unlabored. Heart: RRR with S1 S2. No murmurs, rubs, or gallops appreciated. Abdomen: Soft, non-tender, non-distended with normoactive bowel sounds. No hepatomegaly. No rebound/guarding. No obvious abdominal masses. Msk:  Strength and  tone appear normal for age. Extremities: No clubbing or cyanosis. 1+ bilateral lower extremity edema. Distal pedal pulses are 2+ and equal bilaterally. Neuro: Alert and oriented X 3. No facial asymmetry. No focal deficit. Moves all extremities spontaneously. Psych:  Responds to questions appropriately with a normal affect.   EKG:  The EKG was personally reviewed and demonstrates: Sinus rhythm.  Rate 97 bpm.  Inferolateral T wave inversion. Telemetry:  Telemetry was personally reviewed and demonstrates:   Weights: [employer]   06/01/21 1632  Weight: 94.2 kg    Relevant CV Studies:  2D echo 11/15/2020: 1. Challenging images, definity used   2. Left ventricular ejection fraction, by estimation, is 40 to 45 %. The  left ventricle has mildly decreased function. Mild global hypokinesis with  moderate anteroseptal wall hypokinesis possibly secondary to postoperative  state, and inferior wall  hypokinesis.   3. Right ventricular systolic function is normal. The right ventricular  size is normal. There is normal pulmonary artery systolic pressure. The  estimated right ventricular systolic pressure is 41.9 mmHg.   4. Left atrial size was mildly dilated.   5. The mitral valve is normal in structure. Mild mitral valve  regurgitation. __________  [facility] 11/15/2020: Conclusions: Severe three-vessel coronary artery disease including chronic total occlusions of the ostial LAD, OM1, and proximal RCA. Patent LCx stent with moderate in-stent restenosis (~50%), similar to prior catheterizations. Widely patent LIMA to LAD. Patent SVG to RPDA with patent proximal/mid graft stents that demonstrate mild in-stent restenosis (~10%).  Distal anastomotic stent is widely patent.  There is significant ectasia in the midportion of the graft. Patent SVG to OM with sequential 75% and 60% ostial and mid graft stenoses. Upper normal to mildly elevated left and right heart filling pressures. Normal pulmonary  artery pressure. Mildly reduced Fick cardiac output/index. Successful PCI to SVG to OM with nonoverlapping Resolute Onyx 3.0 x 12 mm (ostial) and 3.0 x 15 mm (mid graft) drug-eluting stents with 0% residual stenosis and TIMI-3 flow.   Recommendations: Overnight observation. Continue dual antiplatelet therapy with aspirin and ticagrelor for at least 12 months, ideally longer.  We will plan to CYP2C19 genotype to see if he is an appropriate clopidogrel responder in case dyspnea persists and alternative therapies need to be considered. Aggressive secondary prevention. Obtain echocardiogram. Optimize goal-directed medical therapy for chronic HFrEF that appears relatively well compensated at this time based on RHC findings.  Laboratory Data:  Chemistry Recent Labs  Lab 06/01/21 1638 06/02/21 0613  NA 133* 137  K 4.5 4.1  CL 99 101  CO2  22 25  GLUCOSE 105* 134*  BUN 11 15  CREATININE 1.31* 1.26*  CALCIUM 8.6* 8.4*  GFRNONAA 59* >60  ANIONGAP 12 11    Recent Labs  Lab 06/01/21 1638  PROT 6.6  ALBUMIN 4.1  AST 30  ALT 22  ALKPHOS 70  BILITOT 1.0   Hematology Recent Labs  Lab 06/01/21 1638 06/02/21 0613  WBC 11.3* 19.4*  RBC 4.28 3.97*  HGB 14.1 12.8*  HCT 42.7 38.8*  MCV 99.8 97.7  MCH 32.9 32.2  MCHC 33.0 33.0  RDW 13.3 13.8  PLT 176 136*   Cardiac EnzymesNo results for input(s): TROPONINI in the last 168 hours. No results for input(s): TROPIPOC in the last 168 hours.  BNP Recent Labs  Lab 06/01/21 2028  BNP 90.0    DDimer No results for input(s): DDIMER in the last 168 hours.  Radiology/Studies:  CT HEAD WO CONTRAST (5MM)  Result Date: 06/01/2021 IMPRESSION: 1. No acute intracranial abnormality. 2. Signs of atrophy which has worsened since the prior study of April of 2016. Electronically Signed   By: Zetta Bills M.D.   On: 06/01/2021 18:15   CT Angio Chest PE W and/or Wo Contrast  Result Date: 06/01/2021 IMPRESSION: 1. Negative for acute pulmonary  embolus or aortic dissection. 2. No acute airspace disease. Aortic Atherosclerosis (ICD10-I70.0). Electronically Signed   By: Donavan Foil M.D.   On: 06/01/2021 18:17   CT ABDOMEN PELVIS W CONTRAST  Result Date: 06/01/2021 IMPRESSION: 1. Acute uncomplicated diverticulitis at the descending/sigmoid junction. 2. Bilateral nephrolithiasis. 3. Moderate hepatic steatosis similar to the prior study. 4. Small hiatal hernia. 5. Small fat containing umbilical hernia. Aortic Atherosclerosis (ICD10-I70.0). Electronically Signed   By: Zetta Bills M.D.   On: 06/01/2021 18:25   DG Chest Portable 1 View  Result Date: 06/01/2021 IMPRESSION: No active disease.  Low lung volumes. Electronically Signed   By: Donavan Foil M.D.   On: 06/01/2021 17:12    Assessment and Plan:   1.  CAD status post CABG status post subsequent PCI with mildly elevated troponin: -Mildly elevated troponin stable in the setting of the patient's severe sepsis with left lower lobe pneumonia and a degree of AKI with mildly elevated serum creatinine from baseline -Trend troponin to peak -It is reasonable to continue heparin drip for now, for a total of 48 hours -Echo pending -PTA aspirin/clopidogrel  2.  HFrEF secondary to ICM: -Blood pressure has been running on the soft side in the setting of his severe sepsis leading GDMT to be held, resume as his clinical course improves -Echo pending as above -Daily weights -Strict I's and O's  3.  HTN: -Blood pressure soft in the setting of his severe sepsis -GDMT held as above  4.  HLD: -LDL 61 with a triglyceride 317 -PTA rosuvastatin and Vascepa  5.  Small AAA: -Noted on ultrasound in 02/2021 measuring 3 cm -Follow-up ultrasound has been recommended in 02/2023 -Outpatient follow-up   For questions or updates, please contact [place] Please consult [email or web address] for contact info under Cardiology/STEMI.   Signed, Christell Faith, PA-C [facility] Pager: [phone] 06/02/2021, 8:15 AM  Attending Addendum  History and all data above reviewed.  Patient examined.  I agree with the findings as above.   All available labs, radiology testing, previous records reviewed. Agree with documented assessment and plan. Mr. Gut is a 8M with CAD status post CABG (1990 06/2005) multiple PCI, chronic systolic and diastolic heart failure, ischemic heart myopathy, gastric  ulcer, hypertension, hyperlipidemia, OSA on CPAP, prior CVA, prior PE, Barrett's esophagus, and diverticulitis admitted with chest pain.  Cardiology was consulted because he was also found to have mildly elevated troponin.  He awakened feeling well but subsequently developed chest pain and came to the emergency department.  He was subsequently diagnosed with sepsis thought to be due to pneumonia.  He does not have any significant respiratory symptoms.  He has had some increased lower extremity edema but no orthopnea or PND.  White blood cell count is increased from 11-19.  The etiology of his infection is still somewhat unclear.  He was started on a heparin drip.  His chest pain has subsided.  EKG did show some inferolateral T wave inversions.  Overall it seems that his chest pain now is less likely due to ischemia than infection and demand ischemia.  Echocardiogram is pending.  We will repeat high-sensitivity troponin to see if there is a significant increase.  For now, we will plan for 48 hours of heparin and medical management.  We will reevaluate the need for further ischemic evaluation after management of his acute illness.  Kota Ciancio C. Oval Linsey, MD, [facility] 06/02/2021 9:39 AM   Amyrah Pinkhasov C. Oval Linsey, MD, [facility]  06/02/2021 9:36 AM

## 2021-06-02 NOTE — Assessment & Plan Note (Addendum)
Blood pressure low normal - Continue Coreg - Hold spironolactone, torsemide, losartan, Imdur until hemodynamics are clearer

## 2021-06-02 NOTE — Assessment & Plan Note (Signed)
-   Hold pantoprazole 

## 2021-06-02 NOTE — Assessment & Plan Note (Addendum)
Presented with fever, tachycardia, elevated lactic acid, encephalopathy (somnolence, generalized weakness), myocardial ischemia.  Due to diverticulitis, Group B strep bacteremia.

## 2021-06-02 NOTE — Progress Notes (Signed)
Pt refused cpap tonight. Stated he would be ok without it for a night but could get his from home if needed.

## 2021-06-02 NOTE — Assessment & Plan Note (Addendum)
Presented with chest pain, troponin delta.  Known multivessel coronary disease.  Treated with 48 hrs heparin gtt.  Evaluated by Cardiology who felt this was demand ischemia, not primary ACS.  NSTEMI ruled out.  - Continue aspirin, Plavix, Coreg, Crestor

## 2021-06-02 NOTE — Assessment & Plan Note (Signed)
-   CPAP at night °

## 2021-06-03 ENCOUNTER — Inpatient Hospital Stay (HOSPITAL_COMMUNITY)
Admit: 2021-06-03 | Discharge: 2021-06-03 | Disposition: A | Discharge status: Home / Self Care | Payer: Medicare HMO | Attending: Internal Medicine | Admitting: Internal Medicine

## 2021-06-03 DIAGNOSIS — I214 Non-ST elevation (NSTEMI) myocardial infarction: Secondary | ICD-10-CM

## 2021-06-03 DIAGNOSIS — I5042 Chronic combined systolic (congestive) and diastolic (congestive) heart failure: Secondary | ICD-10-CM | POA: Diagnosis not present

## 2021-06-03 DIAGNOSIS — I25119 Atherosclerotic heart disease of native coronary artery with unspecified angina pectoris: Secondary | ICD-10-CM | POA: Diagnosis not present

## 2021-06-03 DIAGNOSIS — R0609 Other forms of dyspnea: Secondary | ICD-10-CM

## 2021-06-03 DIAGNOSIS — I110 Hypertensive heart disease with heart failure: Secondary | ICD-10-CM

## 2021-06-03 DIAGNOSIS — R079 Chest pain, unspecified: Secondary | ICD-10-CM

## 2021-06-03 DIAGNOSIS — F32A Depression, unspecified: Secondary | ICD-10-CM

## 2021-06-03 DIAGNOSIS — B951 Streptococcus, group B, as the cause of diseases classified elsewhere: Secondary | ICD-10-CM | POA: Diagnosis not present

## 2021-06-03 DIAGNOSIS — R7881 Bacteremia: Secondary | ICD-10-CM

## 2021-06-03 DIAGNOSIS — R778 Other specified abnormalities of plasma proteins: Secondary | ICD-10-CM

## 2021-06-03 DIAGNOSIS — E871 Hypo-osmolality and hyponatremia: Secondary | ICD-10-CM

## 2021-06-03 DIAGNOSIS — K219 Gastro-esophageal reflux disease without esophagitis: Secondary | ICD-10-CM

## 2021-06-03 DIAGNOSIS — G4733 Obstructive sleep apnea (adult) (pediatric): Secondary | ICD-10-CM

## 2021-06-03 DIAGNOSIS — I21A1 Myocardial infarction type 2: Secondary | ICD-10-CM

## 2021-06-03 DIAGNOSIS — A419 Sepsis, unspecified organism: Secondary | ICD-10-CM | POA: Diagnosis not present

## 2021-06-03 LAB — CBC
HCT: 37 % — ABNORMAL LOW (ref 39.0–52.0)
Hemoglobin: 12 g/dL — ABNORMAL LOW (ref 13.0–17.0)
MCH: 31.9 pg (ref 26.0–34.0)
MCHC: 32.4 g/dL (ref 30.0–36.0)
MCV: 98.4 fL (ref 80.0–100.0)
Platelets: 137 10*3/uL — ABNORMAL LOW (ref 150–400)
RBC: 3.76 MIL/uL — ABNORMAL LOW (ref 4.22–5.81)
RDW: 14.1 % (ref 11.5–15.5)
WBC: 13.8 10*3/uL — ABNORMAL HIGH (ref 4.0–10.5)
nRBC: 0 % (ref 0.0–0.2)

## 2021-06-03 LAB — BASIC METABOLIC PANEL
Anion gap: 8 (ref 5–15)
BUN: 11 mg/dL (ref 8–23)
CO2: 26 mmol/L (ref 22–32)
Calcium: 8.2 mg/dL — ABNORMAL LOW (ref 8.9–10.3)
Chloride: 99 mmol/L (ref 98–111)
Creatinine, Ser: 1.01 mg/dL (ref 0.61–1.24)
GFR, Estimated: >60 mL/min (ref >60)
Glucose, Bld: 110 mg/dL — ABNORMAL HIGH (ref 70–99)
Potassium: 3.8 mmol/L (ref 3.5–5.1)
Sodium: 133 mmol/L — ABNORMAL LOW (ref 135–145)

## 2021-06-03 LAB — ECHOCARDIOGRAM COMPLETE
AR max vel: 1.79 cm2
AV Area VTI: 1.92 cm2
AV Area mean vel: 1.87 cm2
AV Mean grad: 5 mmHg
AV Peak grad: 10 mmHg
Ao pk vel: 1.58 m/s
Area-P 1/2: 4.99 cm2
Calc EF: 55 %
Height: 70 in
MV VTI: 2.02 cm2
P 1/2 time: 398 msec
S' Lateral: 4.68 cm
Single Plane A2C EF: 59.3 %
Single Plane A4C EF: 52.8 %
Weight: 3315.2 oz

## 2021-06-03 LAB — PROCALCITONIN: Procalcitonin: 14.53 ng/mL

## 2021-06-03 LAB — URINE CULTURE: Culture: NO GROWTH

## 2021-06-03 LAB — HEPARIN LEVEL (UNFRACTIONATED)
Heparin Unfractionated: 0.4 IU/mL (ref 0.30–0.70)
Heparin Unfractionated: 0.52 IU/mL (ref 0.30–0.70)

## 2021-06-03 MED ORDER — FUROSEMIDE 10 MG/ML IJ SOLN
40.0000 mg | Freq: Two times a day (BID) | INTRAMUSCULAR | Status: AC
  Administered 2021-06-03 – 2021-06-05 (×4): 40 mg via INTRAVENOUS
  Filled 2021-06-03 (×4): qty 4

## 2021-06-03 NOTE — Progress Notes (Signed)
Progress Note  Patient Name: Willie Clark Date of Encounter: 06/03/2021  [facility] Cardiologist: [name], MD   Subjective   Feeling about the same.  Continues to have epigastric discomfort.  Breathing mildly labored and he is discomfort in bilateral legs that he attributes to swelling.  Inpatient Medications    Scheduled Meds:  aspirin EC  81 mg Oral Daily   carvedilol  3.125 mg Oral BID WC   citalopram  20 mg Oral Daily   clopidogrel  75 mg Oral Daily   finasteride  5 mg Oral Daily   gabapentin  800 mg Oral TID   icosapent Ethyl  2 g Oral BID   rOPINIRole  1 mg Oral BID   rosuvastatin  40 mg Oral Daily   Continuous Infusions:  cefTRIAXone (ROCEPHIN)  IV Stopped (06/02/21 1020)   heparin 1,600 Units/hr (06/03/21 0452)   metronidazole Stopped (06/03/21 0621)   PRN Meds: acetaminophen **OR** acetaminophen, dicyclomine, labetalol, nitroGLYCERIN, ondansetron **OR** ondansetron (ZOFRAN) IV, oxyCODONE   Vital Signs    Vitals:   06/02/21 2019 06/03/21 0022 06/03/21 0353 06/03/21 0818  BP: (!) 125/46 (!) 118/56 111/68 128/66  Pulse: 86 98 72 77  Resp: 20 17 16 16   Temp: 99 F (37.2 C) 98.8 F (37.1 C) 98.9 F (37.2 C) 98 F (36.7 C)  TempSrc: Oral Oral Oral Oral  SpO2:  99% 97% 99%  Weight:      Height:        Intake/Output Summary (Last 24 hours) at 06/03/2021 1128 Last data filed at 06/03/2021 9628 Gross per 24 hour  Intake 1114.62 ml  Output 600 ml  Net 514.62 ml   Last 3 Weights 06/02/2021 06/01/2021 05/23/2021  Weight (lbs) 207 lb 3.2 oz 207 lb 10.8 oz 207 lb 11.2 oz  Weight (kg) 93.985 kg 94.2 kg 94.212 kg      Telemetry    Sinus rhythm.  PVCs.- Personally Reviewed  ECG    N/A - Personally Reviewed  Physical Exam   VS:  BP 128/66 (BP Location: Left Arm)    Pulse 77    Temp 98 F (36.7 C) (Oral)    Resp 16    Ht 5\' 10"  (1.778 m)    Wt 94 kg    SpO2 99%    BMI 29.73 kg/m  , BMI Body mass index is 29.73 kg/m. GENERAL:  Well  appearing HEENT: Pupils equal round and reactive, fundi not visualized, oral mucosa unremarkable NECK:  No jugular venous distention, waveform within normal limits, carotid upstroke brisk and symmetric, no bruits, no thyromegaly LUNGS:  Clear to auscultation bilaterally HEART:  RRR.  PMI not displaced or sustained,S1 and S2 within normal limits, no S3, no S4, no clicks, no rubs, no murmurs ABD:  Flat, positive bowel sounds normal in frequency in pitch, no bruits, no rebound, no guarding, no midline pulsatile mass, no hepatomegaly, no splenomegaly EXT:  2 plus pulses throughout, 1+ LE edema to the upper tibia bilaterally. TTP.  No cyanosis no clubbing SKIN:  Mild erythema of R LE NEURO:  Cranial nerves II through XII grossly intact, motor grossly intact throughout PSYCH:  Cognitively intact, oriented to person place and time   Labs    High Sensitivity Troponin:   Recent Labs  Lab 06/01/21 1638 06/01/21 1836  TROPONINIHS 14 139*     Chemistry Recent Labs  Lab 06/01/21 1638 06/02/21 0613 06/03/21 0756  NA 133* 137 133*  K 4.5 4.1 3.8  CL  99 101 99  CO2 22 25 26   GLUCOSE 105* 134* 110*  BUN 11 15 11   CREATININE 1.31* 1.26* 1.01  CALCIUM 8.6* 8.4* 8.2*  MG  --  1.8  --   PROT 6.6  --   --   ALBUMIN 4.1  --   --   AST 30  --   --   ALT 22  --   --   ALKPHOS 70  --   --   BILITOT 1.0  --   --   GFRNONAA 59* >60 >60  ANIONGAP 12 11 8     Lipids No results for input(s): CHOL, TRIG, HDL, LABVLDL, LDLCALC, CHOLHDL in the last 168 hours.  Hematology Recent Labs  Lab 06/01/21 1638 06/02/21 0613 06/03/21 0735  WBC 11.3* 19.4* 13.8*  RBC 4.28 3.97* 3.76*  HGB 14.1 12.8* 12.0*  HCT 42.7 38.8* 37.0*  MCV 99.8 97.7 98.4  MCH 32.9 32.2 31.9  MCHC 33.0 33.0 32.4  RDW 13.3 13.8 14.1  PLT 176 136* 137*   Thyroid No results for input(s): TSH, FREET4 in the last 168 hours.  BNP Recent Labs  Lab 06/01/21 2028  BNP 90.0    DDimer No results for input(s): DDIMER in the last  168 hours.   Radiology    CT HEAD WO CONTRAST (5MM)  Result Date: 06/01/2021 CLINICAL DATA:  A 71 year old male presents for evaluation of mental status change of unknown cause. Post coronary stenting in July. EXAM: CT HEAD WITHOUT CONTRAST TECHNIQUE: Contiguous axial images were obtained from the base of the skull through the vertex without intravenous contrast. RADIATION DOSE REDUCTION: This exam was performed according to the departmental dose-optimization program which includes automated exposure control, adjustment of the mA and/or kV according to patient size and/or use of iterative reconstruction technique. COMPARISON:  August 01, 2014. FINDINGS: Brain: No evidence of acute infarction, hemorrhage, hydrocephalus, extra-axial collection or mass lesion/mass effect. Signs of atrophy which has worsened since the prior study of April of 2016. Vascular: No hyperdense vessel or unexpected calcification. Skull: Normal. Negative for fracture or focal lesion. Sinuses/Orbits: Mucous retention cyst or polyp in the LEFT maxillary sinus. No substantial mucosal thickening or air-fluid level in the sinuses. Mastoid air cells are clear. Orbits are unremarkable. Other: None. IMPRESSION: 1. No acute intracranial abnormality. 2. Signs of atrophy which has worsened since the prior study of April of 2016. Electronically Signed   By: Zetta Bills M.D.   On: 06/01/2021 18:15   CT Angio Chest PE W and/or Wo Contrast  Result Date: 06/01/2021 CLINICAL DATA:  Chest pain EXAM: CT ANGIOGRAPHY CHEST WITH CONTRAST TECHNIQUE: Multidetector CT imaging of the chest was performed using the standard protocol during bolus administration of intravenous contrast. Multiplanar CT image reconstructions and MIPs were obtained to evaluate the vascular anatomy. RADIATION DOSE REDUCTION: This exam was performed according to the departmental dose-optimization program which includes automated exposure control, adjustment of the mA and/or kV  according to patient size and/or use of iterative reconstruction technique. CONTRAST:  22mL OMNIPAQUE IOHEXOL 350 MG/ML SOLN COMPARISON:  Chest x-ray 06/01/2021, CT chest 07/31/2018, 12/11/2019 FINDINGS: Cardiovascular: Satisfactory opacification of the pulmonary arteries to the segmental level. No evidence of pulmonary embolism. Moderate aortic atherosclerosis. No aneurysm. Post CABG changes. Coronary vascular calcification. Normal cardiac size. No pericardial effusion Mediastinum/Nodes: Midline trachea. No thyroid mass. No suspicious lymph nodes. Esophagus within normal limits Lungs/Pleura: No consolidation, pleural effusion or pneumothorax. Atelectasis or scar at the lingula and left base Upper Abdomen:  No acute abnormality.  Hepatic steatosis Musculoskeletal: Post sternotomy changes. Mild gynecomastia. No acute osseous abnormality. Review of the MIP images confirms the above findings. IMPRESSION: 1. Negative for acute pulmonary embolus or aortic dissection. 2. No acute airspace disease. Aortic Atherosclerosis (ICD10-I70.0). Electronically Signed   By: Donavan Foil M.D.   On: 06/01/2021 18:17   CT ABDOMEN PELVIS W CONTRAST  Result Date: 06/01/2021 CLINICAL DATA:  Acute abdominal pain in a 71 year old male. EXAM: CT ABDOMEN AND PELVIS WITH CONTRAST TECHNIQUE: Multidetector CT imaging of the abdomen and pelvis was performed using the standard protocol following bolus administration of intravenous contrast. RADIATION DOSE REDUCTION: This exam was performed according to the departmental dose-optimization program which includes automated exposure control, adjustment of the mA and/or kV according to patient size and/or use of iterative reconstruction technique. CONTRAST:  1mL OMNIPAQUE IOHEXOL 350 MG/ML SOLN COMPARISON:  March 06, 2021. FINDINGS: Lower chest: Basilar atelectasis without effusion or sign of consolidation. Hepatobiliary: Paste that Pancreas: Normal, without mass, inflammation or ductal  dilatation. Spleen: Normal size and contour. Adrenals/Urinary Tract: Adrenal glands are unremarkable. Symmetric renal enhancement. No sign of hydronephrosis. No suspicious renal lesion or perinephric stranding. Urinary bladder is grossly unremarkable. Small cysts in the bilateral kidneys. Punctate calculi in the interpolar LEFT and RIGHT kidney may approach 3 mm greatest size, 2 on the LEFT and a single calculus on the RIGHT. No signs of ureteral calculus or perivesical stranding. Stomach/Bowel: Small hiatal hernia. No perigastric strain. No sign of small bowel obstruction or inflammation of the small bowel. Normal appendix. Pancolonic diverticulosis which is worse at the distal descending and sigmoid colon. Focal inflammation extending from a diverticulum at the descending/sigmoid junction with adjacent fascial thickening and pericolonic stranding extending from this area. No signs of abscess. No free intraperitoneal air. Vascular/Lymphatic: Aortic atherosclerosis. No sign of aneurysm. Smooth contour of the IVC. There is no gastrohepatic or hepatoduodenal ligament lymphadenopathy. No retroperitoneal or mesenteric lymphadenopathy. No pelvic sidewall lymphadenopathy. Reproductive: Unremarkable by CT. Other: Small fat containing umbilical hernia.  No ascites. Musculoskeletal: No acute bone finding. No destructive bone process. Spinal degenerative changes. IMPRESSION: 1. Acute uncomplicated diverticulitis at the descending/sigmoid junction. 2. Bilateral nephrolithiasis. 3. Moderate hepatic steatosis similar to the prior study. 4. Small hiatal hernia. 5. Small fat containing umbilical hernia. Aortic Atherosclerosis (ICD10-I70.0). Electronically Signed   By: Zetta Bills M.D.   On: 06/01/2021 18:25   DG Chest Portable 1 View  Result Date: 06/01/2021 CLINICAL DATA:  Chest pain EXAM: PORTABLE CHEST 1 VIEW COMPARISON:  11/14/2020 FINDINGS: Post sternotomy changes. Hypoventilatory change causes bronchovascular  crowding. No consolidation, pleural effusion, or pneumothorax. IMPRESSION: No active disease.  Low lung volumes. Electronically Signed   By: Donavan Foil M.D.   On: 06/01/2021 17:12    Cardiac Studies   2D echo 11/15/2020: 1. Challenging images, definity used   2. Left ventricular ejection fraction, by estimation, is 40 to 45 %. The  left ventricle has mildly decreased function. Mild global hypokinesis with  moderate anteroseptal wall hypokinesis possibly secondary to postoperative  state, and inferior wall  hypokinesis.   3. Right ventricular systolic function is normal. The right ventricular  size is normal. There is normal pulmonary artery systolic pressure. The  estimated right ventricular systolic pressure is 93.2 mmHg.   4. Left atrial size was mildly dilated.   5. The mitral valve is normal in structure. Mild mitral valve  regurgitation. __________   [facility] 11/15/2020: Conclusions: Severe three-vessel coronary artery disease including chronic total  occlusions of the ostial LAD, OM1, and proximal RCA. Patent LCx stent with moderate in-stent restenosis (~50%), similar to prior catheterizations. Widely patent LIMA to LAD. Patent SVG to RPDA with patent proximal/mid graft stents that demonstrate mild in-stent restenosis (~10%).  Distal anastomotic stent is widely patent.  There is significant ectasia in the midportion of the graft. Patent SVG to OM with sequential 75% and 60% ostial and mid graft stenoses. Upper normal to mildly elevated left and right heart filling pressures. Normal pulmonary artery pressure. Mildly reduced Fick cardiac output/index. Successful PCI to SVG to OM with nonoverlapping Resolute Onyx 3.0 x 12 mm (ostial) and 3.0 x 15 mm (mid graft) drug-eluting stents with 0% residual stenosis and TIMI-3 flow.   Recommendations: Overnight observation. Continue dual antiplatelet therapy with aspirin and ticagrelor for at least 12 months, ideally longer.  We will plan to  [id number] genotype to see if he is an appropriate clopidogrel responder in case dyspnea persists and alternative therapies need to be considered. Aggressive secondary prevention. Obtain echocardiogram. Optimize goal-directed medical therapy for chronic HFrEF that appears relatively well compensated at this time based on RHC findings.  Patient Profile     Mr. Heinle is a 83M with CAD status post CABG (1990 06/2005) multiple PCI, chronic systolic and diastolic heart failure, ischemic heart myopathy, gastric ulcer, hypertension, hyperlipidemia, OSA on CPAP, prior CVA, prior PE, Barrett's esophagus, and diverticulitis admitted with chest pain.   Assessment & Plan    # CAD s/p CABG/PCI:  # NSTEMI:  Nonobstructive coronary disease.  High-sensitivity troponin was mildly elevated here and he initially had chest discomfort.  However this has resolved with management of his sepsis.  He notes that he feels different than in the past when he is needed PCI.  No plan for further ischemic evaluation in the hospital.  Planning for 48 hours of IV heparin.  Given that he did have some inferolateral T wave inversions, plan for outpatient Myoview once his acute illness has resolved.  He is being treated for diverticulitis and CAP.  Continue aspirin, clopidogrel, carvedilol, Vascepa, and rosuvastatin.  He does report pleuritic chest pain.  Echo pending to assess for evidence of pericardial fluid.  Any changes in his systolic function.  # Chronic systolic and diastolic heart failure: # HTN:  BP stable on carvedilol.  He is volume overloaded.  His home torsemide was held initially.  We will give IV Lasix 40 mg twice daily for 2 days.  Continue to follow renal function.  Echo results pending.     For questions or updates, please contact [place] Please consult [email or web address] for contact info under        Signed, Skeet Latch, MD  06/03/2021, 11:28 AM

## 2021-06-03 NOTE — Consult Note (Signed)
[place] for heparin Indication: chest pain/ACS  Allergies  Allergen Reactions   Amitriptyline Other (See Comments)    Pt unsure of what happens Other reaction(s): Other (See Comments) Pt unsure of what happens Pt unsure of what happens   Cyclobenzaprine Other (See Comments) and Hives    Pt unsure what hapens Pt unsure what hapens Pt unsure what hapens    Dicyclomine Shortness Of Breath and Swelling   Sacubitril-Valsartan Anaphylaxis and Itching   Amitriptyline Hcl    Berniece Salines Flavor Diarrhea   Hydrocodone    Fish Oil Itching   Lidocaine Nausea And Vomiting, Other (See Comments) and Nausea Only    Nasal Spray (only time had reaction) Other reaction(s): Dizziness Nasal Spray (only time had reaction)   Omega-3 Fatty Acids-Vitamin E Itching   Other Diarrhea and Itching    Reactive agents: Onions, bacon, steak   Oxycodone-Acetaminophen Other (See Comments)    Lowers BP Other reaction(s): Other (See Comments) Lowers BP Lowers BP   Sulfa Antibiotics Itching    Patient Measurements: Height: 5\' 10"  (177.8 cm) Weight: 94 kg (207 lb 3.2 oz) IBW/kg (Calculated) : 73 Heparin Dosing Weight: 92.1 kg  Vital Signs: Temp: 98 F (36.7 C) (02/12 0818) Temp Source: Oral (02/12 0818) BP: 128/66 (02/12 0818) Pulse Rate: 77 (02/12 0818)  Labs: Recent Labs    06/01/21 1638 06/01/21 1638 06/01/21 1836 06/02/21 0613 06/02/21 1500 06/03/21 0012 06/03/21 0735 06/03/21 0756  HGB 14.1  --   --  12.8*  --   --  12.0*  --   HCT 42.7  --   --  38.8*  --   --  37.0*  --   PLT 176  --   --  136*  --   --  137*  --   APTT 26  --   --   --   --   --   --   --   LABPROT 12.0  --   --  14.2  --   --   --   --   INR 0.9  --   --  1.1  --   --   --   --   HEPARINUNFRC  --    < >  --  0.29* 0.22* 0.52 0.40  --   CREATININE 1.31*  --   --  1.26*  --   --   --  1.01  TROPONINIHS 14  --  139*  --   --   --   --   --    < > = values in this interval not  displayed.     Estimated Creatinine Clearance: 78.4 mL/min (by C-G formula based on SCr of 1.01 mg/dL).   Medical History: Past Medical History:  Diagnosis Date   Abnormal stress test    Anxiety    Arthritis    Barrett's esophagus    Bowel obstruction ([place]) 02/2009   small bowel   CHF (congestive heart failure) (HCC)    Chronic chest pain    Coronary artery disease    a. 1993 CABG; b. 2007 Redo CABG; c. 09/2015 Cath: LM mild dzs, LAD 100ost, RI mild dzs, LCX 60p, OM1 100, RCA 100ost, VG->OM2 min irregs, LIMA->LAD nl, VG->RPDA 30ost, 20d, nl EF->Med rx.   Depression    Diverticulitis    Diverticulosis    Erectile dysfunction    Fibromyalgia    Gastric ulcer    Gastritis    GERD (  gastroesophageal reflux disease)    Headache    Hemorrhoids    HFrEF (heart failure with reduced ejection fraction) ([place])    a. 09/2016 Echo: EF 55-60%, no rwma, triv AI, mild MR, mildly dil LA. -> as of April 2020: EF 40 to 45%.  Septal dyssynergy/hypokinesis due to postop state but otherwise unable to assess wall motion [name] due to poor study.   History of myocardial infarct at age less than 58 years    Hyperlipidemia    Hypertension    Kidney stones    going to [facility]  June 26 to see kidney specialist   Lightheadedness    Mass on back    Nephrolithiasis    Orthostatic hypotension    a. Improved after discontinuation of metoprolol.   OSA (obstructive sleep apnea)    a. On CPAP.   Pulmonary embolism ([place]) 01/2006   Post-op/treated   Skin lesions, generalized    Stroke [facility]) 1993   right side weakness no blood thinners   on Aspirin   Syncope and collapse    TIA (transient ischemic attack) 08/13/2014   Urticaria     Medications:  No PTA anticoagulation, only Plavix 75 mg and aspirin 81 mg daily  Assessment: 71 y.o. male with CAD and CHF presents to the ED with chest pain. Troponin elevated to 139. Pharmacy has been consulted for heparin dosing for ACS.   Baseline labs: H/H and  plts WNL, aPTT 26, INR 0.9  Goal of Therapy:  Heparin level 0.3-0.7 units/ml Monitor platelets by anticoagulation protocol: Yes   Date Time HL  Rate/comment 2/11  0613  0.29     subtherapeutic; 1200 units/hr 2/11     1500   0.22      subtherapeutic; 1400 units/hr 2/12 0012 0.52 Therapeutic x 1 2/12 0735 0.40 Therapeutic x 2   Plan:  Continue heparin infusion at 1600 units/hr Will recheck HL with AM labs CBC daily while on heparin  Pearla Dubonnet, PharmD Clinical Pharmacist 06/03/2021 9:29 AM

## 2021-06-03 NOTE — Progress Notes (Signed)
°   06/03/21 1815  Mobility  Activity Ambulated independently in hallway;Transferred from bed to chair  Level of Assistance Independent after set-up  Assistive Device None  Distance Ambulated (ft) 500 ft  Activity Response Tolerated well    Took patient on a walk in hallways, able to ambulate without assistive device. Did have fatigue, but was able tolerate exercise well. Left in chair with call bell in reach, safety precautions continued.

## 2021-06-03 NOTE — Consult Note (Signed)
[place] for heparin Indication: chest pain/ACS  Allergies  Allergen Reactions   Amitriptyline Other (See Comments)    Pt unsure of what happens Other reaction(s): Other (See Comments) Pt unsure of what happens Pt unsure of what happens   Cyclobenzaprine Other (See Comments) and Hives    Pt unsure what hapens Pt unsure what hapens Pt unsure what hapens    Dicyclomine Shortness Of Breath and Swelling   Sacubitril-Valsartan Anaphylaxis and Itching   Amitriptyline Hcl    Berniece Salines Flavor Diarrhea   Hydrocodone    Fish Oil Itching   Lidocaine Nausea And Vomiting, Other (See Comments) and Nausea Only    Nasal Spray (only time had reaction) Other reaction(s): Dizziness Nasal Spray (only time had reaction)   Omega-3 Fatty Acids-Vitamin E Itching   Other Diarrhea and Itching    Reactive agents: Onions, bacon, steak   Oxycodone-Acetaminophen Other (See Comments)    Lowers BP Other reaction(s): Other (See Comments) Lowers BP Lowers BP   Sulfa Antibiotics Itching    Patient Measurements: Height: 5\' 10"  (177.8 cm) Weight: 94.2 kg (207 lb 10.8 oz) IBW/kg (Calculated) : 73 Heparin Dosing Weight: 92.1 kg  Vital Signs: Temp: 98.8 F (37.1 C) (02/12 0022) Temp Source: Oral (02/12 0022) BP: 118/56 (02/12 0022) Pulse Rate: 98 (02/12 0022)  Labs: Recent Labs    06/01/21 1638 06/01/21 1836 06/02/21 0613 06/02/21 1500 06/03/21 0012  HGB 14.1  --  12.8*  --   --   HCT 42.7  --  38.8*  --   --   PLT 176  --  136*  --   --   APTT 26  --   --   --   --   LABPROT 12.0  --  14.2  --   --   INR 0.9  --  1.1  --   --   HEPARINUNFRC  --   --  0.29* 0.22* 0.52  CREATININE 1.31*  --  1.26*  --   --   TROPONINIHS 14 139*  --   --   --      Estimated Creatinine Clearance: 62.9 mL/min (A) (by C-G formula based on SCr of 1.26 mg/dL (H)).   Medical History: Past Medical History:  Diagnosis Date   Abnormal stress test    Anxiety    Arthritis     Barrett's esophagus    Bowel obstruction ([place]) 02/2009   small bowel   CHF (congestive heart failure) (HCC)    Chronic chest pain    Coronary artery disease    a. 1993 CABG; b. 2007 Redo CABG; c. 09/2015 Cath: LM mild dzs, LAD 100ost, RI mild dzs, LCX 60p, OM1 100, RCA 100ost, VG->OM2 min irregs, LIMA->LAD nl, VG->RPDA 30ost, 20d, nl EF->Med rx.   Depression    Diverticulitis    Diverticulosis    Erectile dysfunction    Fibromyalgia    Gastric ulcer    Gastritis    GERD (gastroesophageal reflux disease)    Headache    Hemorrhoids    HFrEF (heart failure with reduced ejection fraction) ([place])    a. 09/2016 Echo: EF 55-60%, no rwma, triv AI, mild MR, mildly dil LA. -> as of April 2020: EF 40 to 45%.  Septal dyssynergy/hypokinesis due to postop state but otherwise unable to assess wall motion [name] due to poor study.   History of myocardial infarct at age less than 19 years    Hyperlipidemia    Hypertension  Kidney stones    going to [facility]  June 26 to see kidney specialist   Lightheadedness    Mass on back    Nephrolithiasis    Orthostatic hypotension    a. Improved after discontinuation of metoprolol.   OSA (obstructive sleep apnea)    a. On CPAP.   Pulmonary embolism ([place]) 01/2006   Post-op/treated   Skin lesions, generalized    Stroke [facility]) 1993   right side weakness no blood thinners   on Aspirin   Syncope and collapse    TIA (transient ischemic attack) 08/13/2014   Urticaria     Medications:  No PTA anticoagulation, only Plavix 75 mg and aspirin 81 mg daily  Assessment: 71 y.o. male with CAD and CHF presents to the ED with chest pain. Troponin elevated to 139. Pharmacy has been consulted for heparin dosing for ACS.   Baseline labs: H/H and plts WNL, aPTT 26, INR 0.9  Goal of Therapy:  Heparin level 0.3-0.7 units/ml Monitor platelets by anticoagulation protocol: Yes   Date Time HL  Rate/comment 2/11  0613  0.29     subtherapeutic; 1200 units/hr 2/11      1500  0.22      subtherapeutic; 1400 units/hr 2/12 0012 0.52 Therapeutic x 1   Plan:  Increase heparin infusion to 1600 units/hr Recheck HL in 8 hr to confirm, then daily CBC daily while on heparin  Renda Rolls, PharmD, [facility] 06/03/2021 1:21 AM

## 2021-06-03 NOTE — Progress Notes (Signed)
Progress Note   Patient: Willie Clark ZRA:[id number] DOB: August 08, 1950 DOA: 06/01/2021     2 DOS: the patient was seen and examined on 06/03/2021       Brief hospital course: Willie Clark is a 71 y.o. M with CAD s/p PCI most recently 8 months ago, OSA on CPAP, ulcerative colitis, diverticulitis, hx PE no longer on AC, HTN, and sCHF EF 40-45% who presented with chest pain.  In the ER, also noted to have leukocytosis, tachycardia and abdominal pain, and CT showed acute diverticulitis.  CTA chest showed no pneumonia or PE.  Troponin became elevated, and he was started on heparin for NSTEMI after discussion with Cardiology.  He developed somnolence, which improved some after fluids and antibiotics.   2/11: 1/2 blood culture positive for GBS 2/12: No fever, clinically improving, no further CP       Assessment and Plan: * Severe sepsis (Willie Clark)- (present on admission) Presented with fever, tachycardia, elevated lactic acid, encephalopathy (somnolence, generalized weakness), myocardial ischemia.  Due to diverticulitis, Group B strep bacteremia.   Diverticulitis- (present on admission) Patient has known severe recurrent diverticulitis. At this time CT imaging shows diverticulitis, he has left lower quadrant tenderness, fever, abdominal pain, and group B strep bacteremia - Continue IV Rocephin and Flagyl  - Hold mesalamine, used for diverticulitis  Bacteremia due to group B Streptococcus Good quality echo shows no signs of vegetation.   - Continue Rocephin - Repeat BCx       Demand ischemia (Willie Clark)- (present on admission) Presented with chest pain, troponin delta.  Known multivessel coronary disease.  Treated with 48 hrs heparin gtt.  Evaluated by Cardiology who felt this was demand ischemia, not primary ACS.  NSTEMI ruled out.  - Continue aspirin, Plavix, Coreg, Crestor      Hyponatremia Hypervolemic - Start IV Lasix - Trend BMP  OSA on CPAP - CPAP at night  GERD  (gastroesophageal reflux disease)- (present on admission) - Hold pantoprazole  Depression- (present on admission) - Continue citalopram  Essential hypertension- (present on admission) Blood pressure low normal - Continue Coreg - Hold spironolactone, torsemide, losartan, Imdur until hemodynamics are clearer  Chronic combined systolic and diastolic congestive heart failure (Willie Clark)- (present on admission) Appears euvolemic. Repeat Echo shows unchanged EF 40-45% and grade I DD Has some leg swelling.  - Continue Coreg - Hold Jardiance, losartan, spironolactone, torsemide for now - Start IV Lasix - I/Os  Coronary artery disease involving native coronary artery of native heart with angina pectoris (Willie Clark)- (present on admission) - Continue aspirin, Plavix, Coreg, Crestor        Subjective: He has some moderate left-sided lower quadrant abdominal pain, fatigue.  No fever, vomiting, hematochezia, confusion.  Physical Exam: Vitals:   06/03/21 0353 06/03/21 0818 06/03/21 1212 06/03/21 1521  BP: 111/68 128/66 95/63 106/62  Pulse: 72 77 (!) 57 (!) 59  Resp: 16 16 16 16   Temp: 98.9 F (37.2 C) 98 F (36.7 C) 98.1 F (36.7 C) 98.4 F (36.9 C)  TempSrc: Oral Oral Oral Oral  SpO2: 97% 99% 97% 97%  Weight:      Height:       Elderly adult male, lying in bed, gait shuffling, appears tired Heart rate regular, no murmurs, mild lower extremity edema, slightly worse on the left Respiratory rate and effort normal, lungs clear without rales or wheezes Abdominal exam soft, mild tenderness in the left lower quadrant, no rigidity or rebound Attention normal, affect normal, judgment Syprine  Data Reviewed: Discussed with cardiology Echocardiogram shows EF 40 to 56%, grade 1 diastolic dysfunction, normal valves Procalcitonin improving, cortisol indeterminate but unremarkable Complete blood count shows improving white count, stable hemoglobin, normal platelets Patient metabolic panel  shows mild hyponatremia, normal renal function    Family Communication: Wife at the bedside  Disposition: Status is: Inpatient Remains inpatient appropriate because: He requires ongoing IV antibiotics for severe sepsis with strep bacteremia, from diverticulitis source, and possible in-stent          Planned Discharge Destination:  TBD        Author: Edwin Dada, MD 06/03/2021 3:28 PM  For on call review [email or web address].

## 2021-06-03 NOTE — Assessment & Plan Note (Signed)
Hypervolemic - Start IV Lasix - Trend BMP

## 2021-06-04 ENCOUNTER — Telehealth: Payer: Self-pay

## 2021-06-04 ENCOUNTER — Inpatient Hospital Stay: Payer: Medicare HMO

## 2021-06-04 DIAGNOSIS — I248 Other forms of acute ischemic heart disease: Secondary | ICD-10-CM | POA: Diagnosis not present

## 2021-06-04 DIAGNOSIS — I25119 Atherosclerotic heart disease of native coronary artery with unspecified angina pectoris: Secondary | ICD-10-CM | POA: Diagnosis not present

## 2021-06-04 DIAGNOSIS — A419 Sepsis, unspecified organism: Secondary | ICD-10-CM | POA: Diagnosis not present

## 2021-06-04 DIAGNOSIS — I5042 Chronic combined systolic (congestive) and diastolic (congestive) heart failure: Secondary | ICD-10-CM | POA: Diagnosis not present

## 2021-06-04 DIAGNOSIS — R7881 Bacteremia: Secondary | ICD-10-CM | POA: Diagnosis not present

## 2021-06-04 LAB — CBC
HCT: 37.1 % — ABNORMAL LOW (ref 39.0–52.0)
Hemoglobin: 12 g/dL — ABNORMAL LOW (ref 13.0–17.0)
MCH: 32 pg (ref 26.0–34.0)
MCHC: 32.3 g/dL (ref 30.0–36.0)
MCV: 98.9 fL (ref 80.0–100.0)
Platelets: 147 10*3/uL — ABNORMAL LOW (ref 150–400)
RBC: 3.75 MIL/uL — ABNORMAL LOW (ref 4.22–5.81)
RDW: 13.7 % (ref 11.5–15.5)
WBC: 10.2 10*3/uL (ref 4.0–10.5)
nRBC: 0 % (ref 0.0–0.2)

## 2021-06-04 LAB — CULTURE, BLOOD (ROUTINE X 2)

## 2021-06-04 LAB — BASIC METABOLIC PANEL
Anion gap: 11 (ref 5–15)
BUN: 11 mg/dL (ref 8–23)
CO2: 26 mmol/L (ref 22–32)
Calcium: 8.6 mg/dL — ABNORMAL LOW (ref 8.9–10.3)
Chloride: 99 mmol/L (ref 98–111)
Creatinine, Ser: 0.85 mg/dL (ref 0.61–1.24)
GFR, Estimated: >60 mL/min (ref >60)
Glucose, Bld: 116 mg/dL — ABNORMAL HIGH (ref 70–99)
Potassium: 3.8 mmol/L (ref 3.5–5.1)
Sodium: 136 mmol/L (ref 135–145)

## 2021-06-04 MED ORDER — SPIRONOLACTONE 25 MG PO TABS
25.0000 mg | ORAL_TABLET | Freq: Every day | ORAL | Status: DC
  Administered 2021-06-04 – 2021-06-05 (×2): 25 mg via ORAL
  Filled 2021-06-04 (×2): qty 1

## 2021-06-04 NOTE — Progress Notes (Signed)
PT Cancellation Note  Patient Details Name: Willie Clark MRN: [id number] DOB: 11-18-1950   Cancelled Treatment:    Reason Eval/Treat Not Completed: Other (comment). Per pt and chart review, pt has been up, ambulating with nursing staff, no AD. PT does endorse having bad balance "for years" and uses a SPC as needed. Pt has plans to resume Heart Track at discharge, no acute PT needs identified, PT to sign off.   Lieutenant Diego PT, DPT 8:46 AM,06/04/21

## 2021-06-04 NOTE — Progress Notes (Signed)
Progress Note   Patient: Willie Clark [id number]:[id number] DOB: [birth year] DOA: 06/01/2021     3 DOS: the patient was seen and examined on 06/04/2021       Brief hospital course: Mr. Vana is a 71 y.o. M with CAD s/p PCI most recently 8 months ago, OSA on CPAP, ulcerative colitis, diverticulitis, hx PE no longer on AC, HTN, and sCHF EF 40-45% who presented with chest pain.  In the ER, also noted to have leukocytosis, tachycardia and abdominal pain, and CT showed acute diverticulitis.  CTA chest showed no pneumonia or PE.  Troponin became elevated, and he was started on heparin for NSTEMI after discussion with Cardiology.  He developed somnolence, which improved some after fluids and antibiotics.   2/11: 1/2 blood culture positive for GBS 2/12: No fever, clinically improving, no further CP       Assessment and Plan: * Severe sepsis ([place])- (present on admission) Presented with fever, tachycardia, elevated lactic acid, encephalopathy (somnolence, generalized weakness), myocardial ischemia.  Due to diverticulitis, Group B strep bacteremia.   Diverticulitis- (present on admission) Patient has known severe recurrent diverticulitis. At this time CT imaging shows diverticulitis, he has left lower quadrant tenderness, fever, abdominal pain, and group B strep bacteremia - Continue IV Rocephin and Flagyl  - Hold mesalamine, used for diverticulitis  Bacteremia due to group B Streptococcus Good quality echo shows no signs of vegetation.   - Continue Rocephin - Repeat BCx       Demand ischemia ([place])- (present on admission) Presented with chest pain, troponin delta.  Known multivessel coronary disease.  Treated with 48 hrs heparin gtt.  Evaluated by Cardiology who felt this was demand ischemia, not primary ACS.  NSTEMI ruled out.  - Continue aspirin, Plavix, Coreg, Crestor      Hyponatremia Hypervolemic Resolved with diuresis  OSA on CPAP - CPAP at night  GERD  (gastroesophageal reflux disease)- (present on admission) - Hold pantoprazole  Depression- (present on admission) - Continue citalopram  Essential hypertension- (present on admission) Blood pressure low normal - Continue Coreg - Hold spironolactone, torsemide, losartan, Imdur until hemodynamics are clearer  Chronic combined systolic and diastolic congestive heart failure ([place])- (present on admission) Appears euvolemic. Repeat Echo shows unchanged EF 40-45% and grade I DD Has some leg swelling, net positive today, swelling no change. - Continue IV lasix, increase dose - Resume spironolacotne  - Continue Coreg - Hold Jardiance, losartan, torsemide for now - I/Os - Daily BMP  Coronary artery disease involving native coronary artery of native heart with angina pectoris ([place])- (present on admission) - Continue aspirin, Plavix, Coreg, Crestor        Subjective: Has generalized malaise.  Still has some lower quadrant pain, still has watery diarrhea.  Appetite okay, no confusion  Physical Exam: Vitals:   06/03/21 2312 06/04/21 0437 06/04/21 0750 06/04/21 1136  BP: 107/67 (!) 94/44 131/74 126/77  Pulse: (!) 57 63 65 (!) 57  Resp: 16 17 16 16   Temp: 98.2 F (36.8 C) 98.5 F (36.9 C) 98.4 F (36.9 C) 97.9 F (36.6 C)  TempSrc: Oral Oral Oral Oral  SpO2: 97% 97% 99% 100%  Weight:      Height:       Elderly adult male, lying in bed, gait shuffling, appears tired Heart rate regular, no murmurs, mild lower extremity edema, slightly worse on the left Respiratory rate and effort normal, lungs clear without rales or wheezes Abdominal exam soft, mild tenderness in the left lower quadrant,  no rigidity or rebound Attention normal, affect normal, judgment and insight appear normal      Data Reviewed: Labs and imaging are notable for basic metabolic panel is normal, sodium resolved Complete blood count shows mild anemia, no change from previous, platelets 140, no  change    Family Communication: Wife at the bedside  Disposition: Status is: Inpatient Remains inpatient appropriate because: He requires ongoing IV antibiotics for severe sepsis with strep bacteremia, from diverticulitis source           Planned Discharge Destination:  TBD       Author: Edwin Dada, MD 06/04/2021 3:01 PM  For on call review [email or web address].

## 2021-06-04 NOTE — Care Management Important Message (Signed)
Important Message  Patient Details  Name: Willie Clark MRN: [id number] Date of Birth: 16-Nov-1950   Medicare Important Message Given:  N/A - LOS <3 / Initial given by admissions     Dannette Barbara 06/04/2021, 8:45 AM

## 2021-06-04 NOTE — Progress Notes (Signed)
Progress Note  Patient Name: Willie Clark Date of Encounter: 06/04/2021  [facility] Cardiologist: [name], MD   Subjective   Says he feels tired today. Had some dull chest pain last night.   Inpatient Medications    Scheduled Meds:  aspirin EC  81 mg Oral Daily   carvedilol  3.125 mg Oral BID WC   citalopram  20 mg Oral Daily   clopidogrel  75 mg Oral Daily   finasteride  5 mg Oral Daily   furosemide  40 mg Intravenous BID   gabapentin  800 mg Oral TID   icosapent Ethyl  2 g Oral BID   rOPINIRole  1 mg Oral BID   rosuvastatin  40 mg Oral Daily   Continuous Infusions:  cefTRIAXone (ROCEPHIN)  IV 2 g (06/04/21 0813)   metronidazole 500 mg (06/04/21 0619)   PRN Meds: acetaminophen **OR** acetaminophen, dicyclomine, labetalol, nitroGLYCERIN, ondansetron **OR** ondansetron (ZOFRAN) IV, oxyCODONE   Vital Signs    Vitals:   06/03/21 2312 06/04/21 0437 06/04/21 0750 06/04/21 1136  BP: 107/67 (!) 94/44 131/74 126/77  Pulse: (!) 57 63 65 (!) 57  Resp: 16 17 16 16   Temp: 98.2 F (36.8 C) 98.5 F (36.9 C) 98.4 F (36.9 C) 97.9 F (36.6 C)  TempSrc: Oral Oral Oral Oral  SpO2: 97% 97% 99% 100%  Weight:      Height:        Intake/Output Summary (Last 24 hours) at 06/04/2021 1317 Last data filed at 06/04/2021 0813 Gross per 24 hour  Intake 1143.15 ml  Output 1200 ml  Net -56.85 ml   Last 3 Weights 06/02/2021 06/01/2021 05/23/2021  Weight (lbs) 207 lb 3.2 oz 207 lb 10.8 oz 207 lb 11.2 oz  Weight (kg) 93.985 kg 94.2 kg 94.212 kg      Telemetry    NSR, PVCs, HR 70s - Personally Reviewed  ECG    No new - Personally Reviewed  Physical Exam   GEN: No acute distress.   Neck: No JVD Cardiac: RRR, no murmurs, rubs, or gallops.  Respiratory: Clear to auscultation bilaterally. GI: Soft, nontender, non-distended  MS: 1-2+ lower leg edema; No deformity. Neuro:  Nonfocal  Psych: Normal affect   Labs    High Sensitivity Troponin:   Recent Labs  Lab  06/01/21 1638 06/01/21 1836  TROPONINIHS 14 139*     Chemistry Recent Labs  Lab 06/01/21 1638 06/02/21 0613 06/03/21 0756 06/04/21 0419  NA 133* 137 133* 136  K 4.5 4.1 3.8 3.8  CL 99 101 99 99  CO2 22 25 26 26   GLUCOSE 105* 134* 110* 116*  BUN 11 15 11 11   CREATININE 1.31* 1.26* 1.01 0.85  CALCIUM 8.6* 8.4* 8.2* 8.6*  MG  --  1.8  --   --   PROT 6.6  --   --   --   ALBUMIN 4.1  --   --   --   AST 30  --   --   --   ALT 22  --   --   --   ALKPHOS 70  --   --   --   BILITOT 1.0  --   --   --   GFRNONAA 59* >60 >60 >60  ANIONGAP 12 11 8 11     Lipids No results for input(s): CHOL, TRIG, HDL, LABVLDL, LDLCALC, CHOLHDL in the last 168 hours.  Hematology Recent Labs  Lab 06/02/21 0613 06/03/21 0735 06/04/21 0419  WBC  19.4* 13.8* 10.2  RBC 3.97* 3.76* 3.75*  HGB 12.8* 12.0* 12.0*  HCT 38.8* 37.0* 37.1*  MCV 97.7 98.4 98.9  MCH 32.2 31.9 32.0  MCHC 33.0 32.4 32.3  RDW 13.8 14.1 13.7  PLT 136* 137* 147*   Thyroid No results for input(s): TSH, FREET4 in the last 168 hours.  BNP Recent Labs  Lab 06/01/21 2028  BNP 90.0    DDimer No results for input(s): DDIMER in the last 168 hours.   Radiology    ECHOCARDIOGRAM COMPLETE  Result Date: 06/03/2021    ECHOCARDIOGRAM REPORT   Patient Name:   Willie Clark Date of Exam: 06/03/2021 Medical Rec #:  [id number]     Height:       70.0 in Accession #:    [id number]    Weight:       207.7 lb Date of Birth:  [birth year]     BSA:          2.121 m Patient Age:    71 years      BP:           128/73 mmHg Patient Gender: M             HR:           72 bpm. Exam Location:  [facility] Procedure: 2D Echo Indications:     Elevated Troponin, Dyspnea, Chest Pain  History:         Patient has prior history of Echocardiogram examinations. Risk                  Factors:Hypertension, Sleep Apnea and Dyslipidemia.  Sonographer:     L Thornton-Maynard Referring Phys:  [id number] AMY N COX Diagnosing Phys: Skeet Latch MD IMPRESSIONS  1. Septal-lateral  dyssynchrony. Left ventricular ejection fraction, by estimation, is 45 to 50%. The left ventricle has mildly decreased function. The left ventricle demonstrates global hypokinesis. The left ventricular internal cavity size was mildly dilated. There is mild left ventricular hypertrophy of the lateral segment. Left ventricular diastolic parameters are consistent with Grade I diastolic dysfunction (impaired relaxation).  2. Right ventricular systolic function is normal. The right ventricular size is moderately enlarged.  3. The mitral valve is normal in structure. Mild mitral valve regurgitation. No evidence of mitral stenosis.  4. The aortic valve is tricuspid. Aortic valve regurgitation is trivial. No aortic stenosis is present. FINDINGS  Left Ventricle: Septal-lateral dyssynchrony. Left ventricular ejection fraction, by estimation, is 45 to 50%. The left ventricle has mildly decreased function. The left ventricle demonstrates global hypokinesis. The left ventricular internal cavity size  was mildly dilated. There is mild left ventricular hypertrophy of the lateral segment. Left ventricular diastolic parameters are consistent with Grade I diastolic dysfunction (impaired relaxation). Indeterminate filling pressures. Right Ventricle: The right ventricular size is moderately enlarged. No increase in right ventricular wall thickness. Right ventricular systolic function is normal. Left Atrium: Left atrial size was normal in size. Right Atrium: Right atrial size was normal in size. Pericardium: There is no evidence of pericardial effusion. Mitral Valve: The mitral valve is normal in structure. Mild mitral valve regurgitation. No evidence of mitral valve stenosis. MV peak gradient, 2.8 mmHg. The mean mitral valve gradient is 2.0 mmHg. Tricuspid Valve: The tricuspid valve is normal in structure. Tricuspid valve regurgitation is mild . No evidence of tricuspid stenosis. Aortic Valve: The aortic valve is tricuspid. Aortic  valve regurgitation is trivial. Aortic regurgitation PHT measures 398 msec. No aortic stenosis is present. Aortic  valve mean gradient measures 5.0 mmHg. Aortic valve peak gradient measures 10.0 mmHg. Aortic valve area, by VTI measures 1.92 cm. Pulmonic Valve: The pulmonic valve was normal in structure. Pulmonic valve regurgitation is mild. No evidence of pulmonic stenosis. Aorta: The aortic root is normal in size and structure. IAS/Shunts: No atrial level shunt detected by color flow Doppler.  LEFT VENTRICLE PLAX 2D LVIDd:         5.94 cm     Diastology LVIDs:         4.68 cm     LV e' medial:    5.98 cm/s LV PW:         1.16 cm     LV E/e' medial:  10.0 LV IVS:        0.73 cm     LV e' lateral:   9.79 cm/s LVOT diam:     2.10 cm     LV E/e' lateral: 6.1 LV SV:         54 LV SV Index:   25 LVOT Area:     3.46 cm  LV Volumes (MOD) LV vol d, MOD A2C: 94.3 ml LV vol d, MOD A4C: 80.7 ml LV vol s, MOD A2C: 38.4 ml LV vol s, MOD A4C: 38.1 ml LV SV MOD A2C:     55.9 ml LV SV MOD A4C:     80.7 ml LV SV MOD BP:      48.9 ml RIGHT VENTRICLE RV S prime:     9.14 cm/s TAPSE (M-mode): 0.8 cm LEFT ATRIUM             Index LA diam:        4.00 cm 1.89 cm/m LA Vol (A2C):   31.4 ml 14.80 ml/m LA Vol (A4C):   34.5 ml 16.27 ml/m LA Biplane Vol: 35.3 ml 16.64 ml/m  AORTIC VALVE                     PULMONIC VALVE AV Area (Vmax):    1.79 cm      PV Vmax:          1.10 m/s AV Area (Vmean):   1.87 cm      PV Peak grad:     4.8 mmHg AV Area (VTI):     1.92 cm      PR End Diast Vel: 4.93 msec AV Vmax:           158.00 cm/s AV Vmean:          109.000 cm/s AV VTI:            0.280 m AV Peak Grad:      10.0 mmHg AV Mean Grad:      5.0 mmHg LVOT Vmax:         81.60 cm/s LVOT Vmean:        59.000 cm/s LVOT VTI:          0.155 m LVOT/AV VTI ratio: 0.55 AI PHT:            398 msec  AORTA Ao Root diam: 3.60 cm Ao Asc diam:  3.30 cm MITRAL VALVE               TRICUSPID VALVE MV Area (PHT): 4.99 cm    TR Peak grad:   28.1 mmHg MV Area VTI:    2.02 cm    TR Vmax:        265.00 cm/s MV Peak grad:  2.8 mmHg MV Mean grad:  2.0 mmHg    SHUNTS MV Vmax:       0.84 m/s    Systemic VTI:  0.16 m MV Vmean:      60.4 cm/s   Systemic Diam: 2.10 cm MV Decel Time: 152 msec MV E velocity: 59.60 cm/s MV A velocity: 66.80 cm/s MV E/A ratio:  0.89 Skeet Latch MD Electronically signed by Skeet Latch MD Signature Date/Time: 06/03/2021/2:53:12 PM    Final     Cardiac Studies   Echo 06/03/21 1. Septal-lateral dyssynchrony. Left ventricular ejection fraction, by  estimation, is 45 to 50%. The left ventricle has mildly decreased  function. The left ventricle demonstrates global hypokinesis. The left  ventricular internal cavity size was mildly  dilated. There is mild left ventricular hypertrophy of the lateral  segment. Left ventricular diastolic parameters are consistent with Grade I  diastolic dysfunction (impaired relaxation).   2. Right ventricular systolic function is normal. The right ventricular  size is moderately enlarged.   3. The mitral valve is normal in structure. Mild mitral valve  regurgitation. No evidence of mitral stenosis.   4. The aortic valve is tricuspid. Aortic valve regurgitation is trivial.  No aortic stenosis is present.   2D echo 11/15/2020: 1. Challenging images, definity used   2. Left ventricular ejection fraction, by estimation, is 40 to 45 %. The  left ventricle has mildly decreased function. Mild global hypokinesis with  moderate anteroseptal wall hypokinesis possibly secondary to postoperative  state, and inferior wall  hypokinesis.   3. Right ventricular systolic function is normal. The right ventricular  size is normal. There is normal pulmonary artery systolic pressure. The  estimated right ventricular systolic pressure is 00.8 mmHg.   4. Left atrial size was mildly dilated.   5. The mitral valve is normal in structure. Mild mitral valve  regurgitation. __________   [facility]  11/15/2020: Conclusions: Severe three-vessel coronary artery disease including chronic total occlusions of the ostial LAD, OM1, and proximal RCA. Patent LCx stent with moderate in-stent restenosis (~50%), similar to prior catheterizations. Widely patent LIMA to LAD. Patent SVG to RPDA with patent proximal/mid graft stents that demonstrate mild in-stent restenosis (~10%).  Distal anastomotic stent is widely patent.  There is significant ectasia in the midportion of the graft. Patent SVG to OM with sequential 75% and 60% ostial and mid graft stenoses. Upper normal to mildly elevated left and right heart filling pressures. Normal pulmonary artery pressure. Mildly reduced Fick cardiac output/index. Successful PCI to SVG to OM with nonoverlapping Resolute Onyx 3.0 x 12 mm (ostial) and 3.0 x 15 mm (mid graft) drug-eluting stents with 0% residual stenosis and TIMI-3 flow.   Recommendations: Overnight observation. Continue dual antiplatelet therapy with aspirin and ticagrelor for at least 12 months, ideally longer.  We will plan to CYP2C19 genotype to see if he is an appropriate clopidogrel responder in case dyspnea persists and alternative therapies need to be considered. Aggressive secondary prevention. Obtain echocardiogram. Optimize goal-directed medical therapy for chronic HFrEF that appears relatively well compensated at this time based on RHC findings.    Patient Profile     71 y.o. male with CAD status post CABG (1990 06/2005) multiple PCI, chronic systolic and diastolic heart failure, ischemic heart myopathy, gastric ulcer, hypertension, hyperlipidemia, OSA on CPAP, prior CVA, prior PE, Barrett's esophagus, and diverticulitis admitted with chest pain.   Assessment & Plan    NSTEMI CAD s/p CABG/PCI - mildly elevated troponin in the setting of  severe sepsis left lower lobe PNA and degree of AKI - HS trop peak 139 - Last cath was 10/2020,report above, with PCI performed - s/p IV heparin x  48 hours - Echo ordered LVEF 45-50%, global HK. G1DD - EKG showed Sr with old TWI inferolateral leads  - can consider Myoview Lexiscan, however can possible be OP - he reports persistent dull chest pain - continue Aspirin, Plavix, Coreg, statin  Chronic systolic and diastolic heart failure ICM - Echo this admission showed LVEF 45-50%, global HK, G1DD, moderately enlarged RV , mild MR - Echo in July 2022 showed LVEF 40-45% - PTA torsemide held - IV lasix 40mg  BID - I/Os 1.1L - continue with diuresis  HTN - BP good on coreg  For questions or updates, please contact [facility] Please consult [email or web address] for contact info under        Signed, Khadeejah Castner Ninfa Meeker, PA-C  06/04/2021, 1:17 PM

## 2021-06-04 NOTE — TOC Initial Note (Signed)
Transition of Care [facility]) - Initial/Assessment Note    Patient Details  Name: Willie Clark MRN: [id number] Date of Birth: 04/13/1951  Transition of Care [facility]) CM/SW Contact:    Alberteen Sam, LCSW Phone Number: 06/04/2021, 12:16 PM  Clinical Narrative:                  CSW met with patient at bedside to complete readmission risk assessment.   [facility] [place] and he continues to go to [place] for his PCP apts. [facility], reports no needs at this time.   Please consult TOC should needs arise.    Expected Discharge Plan: Home/Self Care Barriers to Discharge: Continued Medical Work up   Patient Goals and CMS Choice Patient states their goals for this hospitalization and ongoing recovery are:: to go home CMS Medicare.gov Compare Post Acute Care list provided to:: Patient Choice offered to / list presented to : Patient  Expected Discharge Plan and Services Expected Discharge Plan: Home/Self Care       Living arrangements for the past 2 months: Single Family Home                                      Prior Living Arrangements/Services Living arrangements for the past 2 months: Single Family Home Lives with:: Self                   Activities of Daily Living Home Assistive Devices/Equipment: Cane (specify quad or straight) (Straight) ADL Screening (condition at time of admission) Patient's cognitive ability adequate to safely complete daily activities?: Yes Is the patient deaf or have difficulty hearing?: No Does the patient have difficulty seeing, even when wearing glasses/contacts?: No Does the patient have difficulty concentrating, remembering, or making decisions?: No Patient able to express need for assistance with ADLs?: Yes Does the patient have difficulty dressing or bathing?: No Independently performs ADLs?: Yes (appropriate for developmental age) Does the patient have  difficulty walking or climbing stairs?: Yes Weakness of Legs: None Weakness of Arms/Hands: None  Permission Sought/Granted                  Emotional Assessment Appearance:: Appears stated age Attitude/Demeanor/Rapport: Gracious Affect (typically observed): Calm Orientation: : Oriented to Self, Oriented to Place, Oriented to  Time, Oriented to Situation Alcohol / Substance Use: Not Applicable Psych Involvement: No (comment)  Admission diagnosis:  Diverticulitis [K57.92] Encephalopathy [G93.40] Sepsis ([place]) [A41.9] Sepsis, due to unspecified organism, unspecified whether acute organ dysfunction present [facility]) [A41.9] Patient Active Problem List   Diagnosis Date Noted   Hyponatremia 06/03/2021   Bacteremia due to group B Streptococcus 06/02/2021   Diverticulitis 06/01/2021   Severe sepsis ([place]) 06/01/2021   OSA on CPAP 06/01/2021   COPD (chronic obstructive pulmonary disease) ([place]) 11/16/2020   Acute coronary syndrome ([place]) 06/20/2020   Demand ischemia ([place]) 06/20/2020   GERD (gastroesophageal reflux disease) 06/20/2020   Depression 12/12/2019   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   Carpal tunnel syndrome 04/06/2018   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   Synovial cyst of lumbar facet joint 10/01/2016   Kidney stones    Ureteral obstruction    Obstructive uropathy 09/24/2015   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: [phone]:  [phone] [address]Phone: [phone]: [phone] CStanley FHuttigHMarion Surgery Center [phone][address] [id number]: [phone]: [phone]    Social Determinants of Health (SDOH) Interventions    Readmission Risk Interventions No flowsheet data found.

## 2021-06-04 NOTE — Telephone Encounter (Signed)
Willie Clark called in to let us know he is in the hospital with diverticulitis.  He will let us know when he can return.

## 2021-06-05 DIAGNOSIS — K5792 Diverticulitis of intestine, part unspecified, without perforation or abscess without bleeding: Secondary | ICD-10-CM

## 2021-06-05 DIAGNOSIS — A419 Sepsis, unspecified organism: Secondary | ICD-10-CM | POA: Diagnosis not present

## 2021-06-05 DIAGNOSIS — R7881 Bacteremia: Secondary | ICD-10-CM | POA: Diagnosis not present

## 2021-06-05 DIAGNOSIS — R6520 Severe sepsis without septic shock: Secondary | ICD-10-CM | POA: Diagnosis not present

## 2021-06-05 DIAGNOSIS — I25119 Atherosclerotic heart disease of native coronary artery with unspecified angina pectoris: Secondary | ICD-10-CM | POA: Diagnosis not present

## 2021-06-05 DIAGNOSIS — I248 Other forms of acute ischemic heart disease: Secondary | ICD-10-CM | POA: Diagnosis not present

## 2021-06-05 DIAGNOSIS — I5042 Chronic combined systolic (congestive) and diastolic (congestive) heart failure: Secondary | ICD-10-CM | POA: Diagnosis not present

## 2021-06-05 LAB — BASIC METABOLIC PANEL
Anion gap: 9 (ref 5–15)
BUN: 10 mg/dL (ref 8–23)
CO2: 27 mmol/L (ref 22–32)
Calcium: 9.2 mg/dL (ref 8.9–10.3)
Chloride: 104 mmol/L (ref 98–111)
Creatinine, Ser: 0.92 mg/dL (ref 0.61–1.24)
GFR, Estimated: >60 mL/min (ref >60)
Glucose, Bld: 107 mg/dL — ABNORMAL HIGH (ref 70–99)
Potassium: 3.2 mmol/L — ABNORMAL LOW (ref 3.5–5.1)
Sodium: 140 mmol/L (ref 135–145)

## 2021-06-05 MED ORDER — AMOXICILLIN-POT CLAVULANATE 875-125 MG PO TABS: 1.0000 | ORAL_TABLET | Freq: Two times a day (BID) | ORAL | 0 refills | Status: AC

## 2021-06-05 MED ORDER — POTASSIUM CHLORIDE CRYS ER 20 MEQ PO TBCR
30.0000 meq | EXTENDED_RELEASE_TABLET | Freq: Two times a day (BID) | ORAL | Status: DC
  Administered 2021-06-05: 30 meq via ORAL
  Filled 2021-06-05: qty 1

## 2021-06-05 MED ORDER — ISOSORBIDE MONONITRATE ER 60 MG PO TB24: 30.0000 mg | ORAL_TABLET | Freq: Every day | ORAL | 3 refills | Status: DC

## 2021-06-05 NOTE — Discharge Summary (Signed)
Physician Discharge Summary   Patient: CORDIE BUENING MRN: [id number] DOB: 1950/07/24  Admit date:     06/01/2021  Discharge date: 06/05/21  Discharge Physician: Edwin Dada   PCP: [facility] [place]   Recommendations at discharge:  Follow up with PCP Freddy Finner in 1 week Dr. Elsworth Soho: Please obtain BMP and check BP and resume losartan and full dose Imdur if appropriate Follow up with Cardiology, Drs. Arida and Gollan as directed for ischemic work up in 2-4 weeks Follow up with GI Dr. Haig Prophet as planned Dr. Haig Prophet: Please refer to General Surgery for recurrent diverticulitis (now with bacteremia) as you feel appropriate       Discharge Diagnoses: Principal Problem:   Severe sepsis ([place]) Active Problems:   Diverticulitis   Bacteremia due to group B Streptococcus   Demand ischemia ([place])   Coronary artery disease involving native coronary artery of native heart with angina pectoris ([place])   Chronic combined systolic and diastolic congestive heart failure ([place])   Essential hypertension   Depression   GERD (gastroesophageal reflux disease)   OSA on CPAP   Hyponatremia       Hospital Course: Mr. Housman is a 71 y.o. M with CAD s/p PCI most recently 8 months ago, OSA on CPAP, ulcerative colitis, diverticulitis, hx PE no longer on AC, HTN, and sCHF EF 40-45% who presented with chest pain.  In the ER, also noted to have leukocytosis, tachycardia and abdominal pain, and CT showed acute diverticulitis.  CTA chest showed no pneumonia or PE.  Troponin became elevated, and he was started on heparin for NSTEMI after discussion with Cardiology.  He developed somnolence, which improved some after fluids and antibiotics.         Assessment and Plan: * Severe sepsis ([place])- (present on admission) Severe sepsis due to Group B streptococcus bacteremia due to diverticulitis Presented with fever, tachycardia, elevated lactic acid, encephalopathy (somnolence,  generalized weakness), myocardial ischemia.  Due to diverticulitis, Group B strep bacteremia.   Patient has known severe recurrent diverticulitis. Here, he had CT imaging showing diverticulitis, as well as LLQ tenderness, fever, abdominal pain, and group B strep bacteremia  Treated with Rocephin and Flagyl.  Blood cultures positive for GBS, likely source GI.  Low likelihood of IE.  Rpeat cultures negative.  Discharged to complete 14 days with Augmentin and recommended close PCP and GI follow up.        Demand ischemia ([place])- (present on admission) Presented with chest pain, troponin delta.  Known multivessel coronary disease.  Treated with 48 hrs heparin gtt.  Evaluated by Cardiology who felt this was demand ischemia, not primary ACS.  NSTEMI ruled out.               Pain control - [employer] Controlled Substance Reporting System database was reviewed.        Consultants: Cardiology Procedures performed: CT abdomen, Echo  Disposition: Home Diet recommendation:  Low residue  DISCHARGE MEDICATION: Allergies as of 06/05/2021       Reactions   Amitriptyline Other (See Comments)   Pt unsure of what happens Other reaction(s): Other (See Comments) Pt unsure of what happens Pt unsure of what happens   Cyclobenzaprine Other (See Comments), Hives   Pt unsure what hapens Pt unsure what hapens Pt unsure what hapens   Dicyclomine Shortness Of Breath, Swelling   Sacubitril-valsartan Anaphylaxis, Itching   Amitriptyline Hcl    Berniece Salines Flavor Diarrhea   Hydrocodone    Fish  Oil Itching   Lidocaine Nausea And Vomiting, Other (See Comments), Nausea Only   Nasal Spray (only time had reaction) Other reaction(s): Dizziness Nasal Spray (only time had reaction)   Omega-3 Fatty Acids-vitamin E Itching   Other Diarrhea, Itching   Reactive agents: Onions, bacon, steak   Oxycodone-acetaminophen Other (See Comments)   Lowers BP Other reaction(s): Other (See Comments) Lowers  BP Lowers BP   Sulfa Antibiotics Itching        Medication List     STOP taking these medications    losartan 25 MG tablet Commonly known as: COZAAR       TAKE these medications    albuterol 108 (90 Base) MCG/ACT inhaler Commonly known as: VENTOLIN HFA Inhale into the lungs every 4 (four) hours as needed for wheezing or shortness of breath.   amoxicillin-clavulanate 875-125 MG tablet Commonly known as: Augmentin Take 1 tablet by mouth 2 (two) times daily for 10 days.   aspirin 81 MG EC tablet Take 1 tablet (81 mg total) by mouth daily. Swallow whole.   carvedilol 3.125 MG tablet Commonly known as: COREG TAKE 1 TABLET (3.125 MG TOTAL) BY MOUTH 2 (TWO) TIMES DAILY WITH A MEAL.   citalopram 20 MG tablet Commonly known as: CELEXA Take 20 mg by mouth daily.   clopidogrel 75 MG tablet Commonly known as: PLAVIX TAKE (1) TABLET 75 MG DAILY.   dicyclomine 10 MG capsule Commonly known as: BENTYL TAKE 1 CAPSULE THREE TIMES DAILY AS NEEDED   docusate calcium 240 MG capsule Commonly known as: SURFAK Take 240 mg by mouth daily.   empagliflozin 10 MG Tabs tablet Commonly known as: Jardiance Take 1 tablet (10 mg total) by mouth daily before breakfast.   EPINEPHrine 0.3 mg/0.3 mL Soaj injection Commonly known as: EPI-PEN Inject 0.3 mg into the muscle as needed.   fexofenadine 180 MG tablet Commonly known as: ALLEGRA Take 180 mg by mouth daily.   finasteride 5 MG tablet Commonly known as: PROSCAR Take 5 mg by mouth daily.   furosemide 20 MG tablet Commonly known as: LASIX Take 20 mg by mouth daily.   gabapentin 800 MG tablet Commonly known as: NEURONTIN Take 1 tablet by mouth 3 (three) times daily.   HAIR SKIN AND NAILS FORMULA PO Take 1 capsule by mouth daily.   IBGARD PO Take by mouth daily at 2 am.   isosorbide mononitrate 60 MG 24 hr tablet Commonly known as: IMDUR Take 0.5 tablets (30 mg total) by mouth daily. What changed: how much to take    Klor-Con M20 20 MEQ tablet Generic drug: potassium chloride SA TAKE 1 TABLET BY MOUTH EVERY DAY   mesalamine 0.375 g 24 hr capsule Commonly known as: APRISO Take 1.4 mg by mouth daily.   NALTREXONE HCL PO Take 2.5 mg by mouth daily.   nitroGLYCERIN 0.4 MG SL tablet Commonly known as: NITROSTAT Place 1 tablet (0.4 mg total) under the tongue every 5 (five) minutes as needed for chest pain.   nystatin-triamcinolone ointment Commonly known as: MYCOLOG Apply 1 application topically 2 (two) times daily.   ondansetron 4 MG tablet Commonly known as: ZOFRAN Take 4 mg by mouth every 8 (eight) hours as needed for nausea or vomiting.   oxyCODONE 5 MG immediate release tablet Commonly known as: Oxy IR/ROXICODONE 0.5-1 mg every 6 (six) hours as needed.   pantoprazole 40 MG tablet Commonly known as: PROTONIX Take 40 mg by mouth 2 (two) times daily.   PROBIOTIC PO Take by  mouth daily at 8 pm.   rOPINIRole 1 MG tablet Commonly known as: REQUIP Take 1 mg by mouth 2 (two) times daily.   rosuvastatin 40 MG tablet Commonly known as: CRESTOR TAKE 1 TABLET BY MOUTH EVERY DAY   spironolactone 25 MG tablet Commonly known as: ALDACTONE TAKE 1 TABLET EVERY DAY   torsemide 20 MG tablet Commonly known as: DEMADEX Take 2 tablets (40 mg total) by mouth daily.   traMADol 50 MG tablet Commonly known as: ULTRAM Take 50 mg by mouth every 6 (six) hours as needed.   Vascepa 1 g capsule Generic drug: icosapent Ethyl TAKE 2 CAPSULES TWICE DAILY   vitamin B-12 1000 MCG tablet Commonly known as: CYANOCOBALAMIN Take 1,000 mcg by mouth daily.        Follow-up [place], [facility]. Schedule an appointment as soon as possible for a visit in 1 week(s).   Specialty: General Practice Contact information: [place] [address] [phone]         Wellington Hampshire, MD. Schedule an appointment as soon as possible for a visit  on 07/12/2021.   Specialty: Cardiology Why: @ 2:20pm Contact information: [address] [place] [address] [phone]         Lesly Rubenstein, MD. Schedule an appointment as soon as possible for a visit in 1 week(s).   Specialty: Gastroenterology Contact information: [place] [address] [phone]                Discharge Instructions     Discharge instructions   Complete by: As directed    From Dr. Loleta Books: You were admitted for diverticulitis. You had a CT scan that showed a new divertulitis in the left lower abdomen This was treated with antibiotics here, and you should complete treatment with 10 more days of Augmentin 875-125 mg  Take this twice daily, once in the morning and once at night  Eat a soft low fiber diet This is foods like bananas, applesauce, toast, crackers, pudding, white bread, pasta. AVOID fiber, nuts, seeds, raw vegetables or fruits for the next week or two.  You did have bacteria in your blood culture, this likely came from the diverticulitis and was transient.  Go see Dr. Haig Prophet as planned Discuss with him follow up with a surgeon to remove that part of the colon that keeps causing problems    Also, at first we were concerned that you had had a heart attack, because of the chest pain. However, we suspect (in retrospect) that this was just stress from the infection  Go see your primary Cardiologist in 1 month for further instructions    Here are some TEMPORARY changes to your medicines: Do NOT take losartan REDUCE isosorbide mononitrate/Imdur to 30 mg (half tab)   Take your other home medicines, including your diuretics, spironolactone and Jardiance and torsemide/Furosemide (IMPORTANT: take EITHER furosemide or torsemide, whichever you were prescribed, not both)  Go see your primary care doctor in 1 week When you see them, ask about restarting the losartan and Imdur, based on your  blood pressure   Increase activity slowly   Complete by: As directed         Discharge Exam: Filed Weights   06/01/21 1632 06/02/21 1416  Weight: 94.2 kg 94 kg   General: Pt is alert, awake, not in acute distress Cardiovascular: RRR, nl S1-S2, no murmurs appreciated.  No LE edema.   Respiratory: Normal respiratory rate and rhythm.  CTAB without rales or wheezes. Neuro/Psych: Strength symmetric in upper and lower extremities.  Judgment and insight appear normal.   Condition at discharge: good  The results of significant diagnostics from this hospitalization (including imaging, microbiology, ancillary and laboratory) are listed below for reference.   Imaging Studies: CT HEAD WO CONTRAST (5MM)  Result Date: 06/01/2021 CLINICAL DATA:  A 71 year old male presents for evaluation of mental status change of unknown cause. Post coronary stenting in July. EXAM: CT HEAD WITHOUT CONTRAST TECHNIQUE: Contiguous axial images were obtained from the base of the skull through the vertex without intravenous contrast. RADIATION DOSE REDUCTION: This exam was performed according to the departmental dose-optimization program which includes automated exposure control, adjustment of the mA and/or kV according to patient size and/or use of iterative reconstruction technique. COMPARISON:  August 01, 2014. FINDINGS: Brain: No evidence of acute infarction, hemorrhage, hydrocephalus, extra-axial collection or mass lesion/mass effect. Signs of atrophy which has worsened since the prior study of April of 2016. Vascular: No hyperdense vessel or unexpected calcification. Skull: Normal. Negative for fracture or focal lesion. Sinuses/Orbits: Mucous retention cyst or polyp in the LEFT maxillary sinus. No substantial mucosal thickening or air-fluid level in the sinuses. Mastoid air cells are clear. Orbits are unremarkable. Other: None. IMPRESSION: 1. No acute intracranial abnormality. 2. Signs of atrophy which has worsened  since the prior study of April of 2016. Electronically Signed   By: Zetta Bills M.D.   On: 06/01/2021 18:15   CT Angio Chest PE W and/or Wo Contrast  Result Date: 06/01/2021 CLINICAL DATA:  Chest pain EXAM: CT ANGIOGRAPHY CHEST WITH CONTRAST TECHNIQUE: Multidetector CT imaging of the chest was performed using the standard protocol during bolus administration of intravenous contrast. Multiplanar CT image reconstructions and MIPs were obtained to evaluate the vascular anatomy. RADIATION DOSE REDUCTION: This exam was performed according to the departmental dose-optimization program which includes automated exposure control, adjustment of the mA and/or kV according to patient size and/or use of iterative reconstruction technique. CONTRAST:  49mL OMNIPAQUE IOHEXOL 350 MG/ML SOLN COMPARISON:  Chest x-ray 06/01/2021, CT chest 07/31/2018, 12/11/2019 FINDINGS: Cardiovascular: Satisfactory opacification of the pulmonary arteries to the segmental level. No evidence of pulmonary embolism. Moderate aortic atherosclerosis. No aneurysm. Post CABG changes. Coronary vascular calcification. Normal cardiac size. No pericardial effusion Mediastinum/Nodes: Midline trachea. No thyroid mass. No suspicious lymph nodes. Esophagus within normal limits Lungs/Pleura: No consolidation, pleural effusion or pneumothorax. Atelectasis or scar at the lingula and left base Upper Abdomen: No acute abnormality.  Hepatic steatosis Musculoskeletal: Post sternotomy changes. Mild gynecomastia. No acute osseous abnormality. Review of the MIP images confirms the above findings. IMPRESSION: 1. Negative for acute pulmonary embolus or aortic dissection. 2. No acute airspace disease. Aortic Atherosclerosis (ICD10-I70.0). Electronically Signed   By: Donavan Foil M.D.   On: 06/01/2021 18:17   CT ABDOMEN PELVIS W CONTRAST  Result Date: 06/01/2021 CLINICAL DATA:  Acute abdominal pain in a 71 year old male. EXAM: CT ABDOMEN AND PELVIS WITH CONTRAST  TECHNIQUE: Multidetector CT imaging of the abdomen and pelvis was performed using the standard protocol following bolus administration of intravenous contrast. RADIATION DOSE REDUCTION: This exam was performed according to the departmental dose-optimization program which includes automated exposure control, adjustment of the mA and/or kV according to patient size and/or use of iterative reconstruction technique. CONTRAST:  19mL OMNIPAQUE IOHEXOL 350 MG/ML SOLN COMPARISON:  March 06, 2021. FINDINGS: Lower chest: Basilar atelectasis without effusion or  sign of consolidation. Hepatobiliary: Paste that Pancreas: Normal, without mass, inflammation or ductal dilatation. Spleen: Normal size and contour. Adrenals/Urinary Tract: Adrenal glands are unremarkable. Symmetric renal enhancement. No sign of hydronephrosis. No suspicious renal lesion or perinephric stranding. Urinary bladder is grossly unremarkable. Small cysts in the bilateral kidneys. Punctate calculi in the interpolar LEFT and RIGHT kidney may approach 3 mm greatest size, 2 on the LEFT and a single calculus on the RIGHT. No signs of ureteral calculus or perivesical stranding. Stomach/Bowel: Small hiatal hernia. No perigastric strain. No sign of small bowel obstruction or inflammation of the small bowel. Normal appendix. Pancolonic diverticulosis which is worse at the distal descending and sigmoid colon. Focal inflammation extending from a diverticulum at the descending/sigmoid junction with adjacent fascial thickening and pericolonic stranding extending from this area. No signs of abscess. No free intraperitoneal air. Vascular/Lymphatic: Aortic atherosclerosis. No sign of aneurysm. Smooth contour of the IVC. There is no gastrohepatic or hepatoduodenal ligament lymphadenopathy. No retroperitoneal or mesenteric lymphadenopathy. No pelvic sidewall lymphadenopathy. Reproductive: Unremarkable by CT. Other: Small fat containing umbilical hernia.  No ascites.  Musculoskeletal: No acute bone finding. No destructive bone process. Spinal degenerative changes. IMPRESSION: 1. Acute uncomplicated diverticulitis at the descending/sigmoid junction. 2. Bilateral nephrolithiasis. 3. Moderate hepatic steatosis similar to the prior study. 4. Small hiatal hernia. 5. Small fat containing umbilical hernia. Aortic Atherosclerosis (ICD10-I70.0). Electronically Signed   By: Zetta Bills M.D.   On: 06/01/2021 18:25   US Venous Img Lower Unilateral Left (DVT)  Result Date: 06/04/2021 CLINICAL DATA:  Left lower extremity swelling for 3 weeks EXAM: LEFT LOWER EXTREMITY VENOUS DOPPLER ULTRASOUND TECHNIQUE: Gray-scale sonography with graded compression, as well as color Doppler and duplex ultrasound were performed to evaluate the lower extremity deep venous systems from the level of the common femoral vein and including the common femoral, femoral, profunda femoral, popliteal and calf veins including the posterior tibial, peroneal and gastrocnemius veins when visible. The superficial great saphenous vein was also interrogated. Spectral Doppler was utilized to evaluate flow at rest and with distal augmentation maneuvers in the common femoral, femoral and popliteal veins. COMPARISON:  None. FINDINGS: Contralateral Common Femoral Vein: Respiratory phasicity is normal and symmetric with the symptomatic side. No evidence of thrombus. Normal compressibility. Common Femoral Vein: No evidence of thrombus. Normal compressibility, respiratory phasicity and response to augmentation. Saphenofemoral Junction: No evidence of thrombus. Normal compressibility and flow on color Doppler imaging. Profunda Femoral Vein: No evidence of thrombus. Normal compressibility and flow on color Doppler imaging. Femoral Vein: No evidence of thrombus. Normal compressibility, respiratory phasicity and response to augmentation. Popliteal Vein: No evidence of thrombus. Normal compressibility, respiratory phasicity and  response to augmentation. Calf Veins: No evidence of thrombus. Normal compressibility and flow on color Doppler imaging. Other Findings:  Peripheral subcutaneous calf edema noted. IMPRESSION: No evidence of deep venous thrombosis. Electronically Signed   By: Jerilynn Mages.  Shick M.D.   On: 06/04/2021 16:09   DG Chest Portable 1 View  Result Date: 06/01/2021 CLINICAL DATA:  Chest pain EXAM: PORTABLE CHEST 1 VIEW COMPARISON:  11/14/2020 FINDINGS: Post sternotomy changes. Hypoventilatory change causes bronchovascular crowding. No consolidation, pleural effusion, or pneumothorax. IMPRESSION: No active disease.  Low lung volumes. Electronically Signed   By: Donavan Foil M.D.   On: 06/01/2021 17:12   ECHOCARDIOGRAM COMPLETE  Result Date: 06/03/2021    ECHOCARDIOGRAM REPORT   Patient Name:   KIJUAN GALLICCHIO Date of Exam: 06/03/2021 Medical Rec #:  [id number]     Height:  70.0 in Accession #:    [id number]    Weight:       207.7 lb Date of Birth:  09-26-1950     BSA:          2.121 m Patient Age:    75 years      BP:           128/73 mmHg Patient Gender: M             HR:           72 bpm. Exam Location:  [facility] Procedure: 2D Echo Indications:     Elevated Troponin, Dyspnea, Chest Pain  History:         Patient has prior history of Echocardiogram examinations. Risk                  Factors:Hypertension, Sleep Apnea and Dyslipidemia.  Sonographer:     L Thornton-Maynard Referring Phys:  [id number] AMY N COX Diagnosing Phys: Skeet Latch MD IMPRESSIONS  1. Septal-lateral dyssynchrony. Left ventricular ejection fraction, by estimation, is 45 to 50%. The left ventricle has mildly decreased function. The left ventricle demonstrates global hypokinesis. The left ventricular internal cavity size was mildly dilated. There is mild left ventricular hypertrophy of the lateral segment. Left ventricular diastolic parameters are consistent with Grade I diastolic dysfunction (impaired relaxation).  2. Right ventricular systolic function is  normal. The right ventricular size is moderately enlarged.  3. The mitral valve is normal in structure. Mild mitral valve regurgitation. No evidence of mitral stenosis.  4. The aortic valve is tricuspid. Aortic valve regurgitation is trivial. No aortic stenosis is present. FINDINGS  Left Ventricle: Septal-lateral dyssynchrony. Left ventricular ejection fraction, by estimation, is 45 to 50%. The left ventricle has mildly decreased function. The left ventricle demonstrates global hypokinesis. The left ventricular internal cavity size  was mildly dilated. There is mild left ventricular hypertrophy of the lateral segment. Left ventricular diastolic parameters are consistent with Grade I diastolic dysfunction (impaired relaxation). Indeterminate filling pressures. Right Ventricle: The right ventricular size is moderately enlarged. No increase in right ventricular wall thickness. Right ventricular systolic function is normal. Left Atrium: Left atrial size was normal in size. Right Atrium: Right atrial size was normal in size. Pericardium: There is no evidence of pericardial effusion. Mitral Valve: The mitral valve is normal in structure. Mild mitral valve regurgitation. No evidence of mitral valve stenosis. MV peak gradient, 2.8 mmHg. The mean mitral valve gradient is 2.0 mmHg. Tricuspid Valve: The tricuspid valve is normal in structure. Tricuspid valve regurgitation is mild . No evidence of tricuspid stenosis. Aortic Valve: The aortic valve is tricuspid. Aortic valve regurgitation is trivial. Aortic regurgitation PHT measures 398 msec. No aortic stenosis is present. Aortic valve mean gradient measures 5.0 mmHg. Aortic valve peak gradient measures 10.0 mmHg. Aortic valve area, by VTI measures 1.92 cm. Pulmonic Valve: The pulmonic valve was normal in structure. Pulmonic valve regurgitation is mild. No evidence of pulmonic stenosis. Aorta: The aortic root is normal in size and structure. IAS/Shunts: No atrial level shunt  detected by color flow Doppler.  LEFT VENTRICLE PLAX 2D LVIDd:         5.94 cm     Diastology LVIDs:         4.68 cm     LV e' medial:    5.98 cm/s LV PW:         1.16 cm     LV E/e' medial:  10.0 LV IVS:  0.73 cm     LV e' lateral:   9.79 cm/s LVOT diam:     2.10 cm     LV E/e' lateral: 6.1 LV SV:         54 LV SV Index:   25 LVOT Area:     3.46 cm  LV Volumes (MOD) LV vol d, MOD A2C: 94.3 ml LV vol d, MOD A4C: 80.7 ml LV vol s, MOD A2C: 38.4 ml LV vol s, MOD A4C: 38.1 ml LV SV MOD A2C:     55.9 ml LV SV MOD A4C:     80.7 ml LV SV MOD BP:      48.9 ml RIGHT VENTRICLE RV S prime:     9.14 cm/s TAPSE (M-mode): 0.8 cm LEFT ATRIUM             Index LA diam:        4.00 cm 1.89 cm/m LA Vol (A2C):   31.4 ml 14.80 ml/m LA Vol (A4C):   34.5 ml 16.27 ml/m LA Biplane Vol: 35.3 ml 16.64 ml/m  AORTIC VALVE                     PULMONIC VALVE AV Area (Vmax):    1.79 cm      PV Vmax:          1.10 m/s AV Area (Vmean):   1.87 cm      PV Peak grad:     4.8 mmHg AV Area (VTI):     1.92 cm      PR End Diast Vel: 4.93 msec AV Vmax:           158.00 cm/s AV Vmean:          109.000 cm/s AV VTI:            0.280 m AV Peak Grad:      10.0 mmHg AV Mean Grad:      5.0 mmHg LVOT Vmax:         81.60 cm/s LVOT Vmean:        59.000 cm/s LVOT VTI:          0.155 m LVOT/AV VTI ratio: 0.55 AI PHT:            398 msec  AORTA Ao Root diam: 3.60 cm Ao Asc diam:  3.30 cm MITRAL VALVE               TRICUSPID VALVE MV Area (PHT): 4.99 cm    TR Peak grad:   28.1 mmHg MV Area VTI:   2.02 cm    TR Vmax:        265.00 cm/s MV Peak grad:  2.8 mmHg MV Mean grad:  2.0 mmHg    SHUNTS MV Vmax:       0.84 m/s    Systemic VTI:  0.16 m MV Vmean:      60.4 cm/s   Systemic Diam: 2.10 cm MV Decel Time: 152 msec MV E velocity: 59.60 cm/s MV A velocity: 66.80 cm/s MV E/A ratio:  0.89 Skeet Latch MD Electronically signed by Skeet Latch MD Signature Date/Time: 06/03/2021/2:53:12 PM    Final     Microbiology: Results for orders placed or  performed during the hospital encounter of 06/01/21  Blood culture (routine x 2)     Status: Abnormal   Collection Time: 06/01/21  4:42 PM   Specimen: BLOOD  Result Value Ref Range Status   Specimen Description  Final    BLOOD LAC Performed at [facility], [address], [place], [place] [address]    Special Requests   Final    BOTTLES DRAWN AEROBIC AND ANAEROBIC BCAV Performed at [facility], [address] [place], [place] [address]    Culture  Setup Time   Final    GRAM POSITIVE COCCI IN BOTH AEROBIC AND ANAEROBIC BOTTLES CRITICAL RESULT CALLED TO, READ BACK BY AND VERIFIED WITH: Lloyd Huger PHARMD AT 0400 06/02/2021 GAA Performed at [facility], [place] [address], [place], [place] [address]    Culture GROUP B STREP(S.AGALACTIAE)ISOLATED (A)  Final   Report Status 06/04/2021 FINAL  Final   Organism ID, Bacteria GROUP B STREP(S.AGALACTIAE)ISOLATED  Final      Susceptibility   Group b strep(s.agalactiae)isolated - MIC*    CLINDAMYCIN >=1 RESISTANT Resistant     AMPICILLIN <=0.25 SENSITIVE Sensitive     ERYTHROMYCIN >=8 RESISTANT Resistant     VANCOMYCIN 0.5 SENSITIVE Sensitive     CEFTRIAXONE <=0.12 SENSITIVE Sensitive     LEVOFLOXACIN 0.5 SENSITIVE Sensitive     PENICILLIN Value in next row Sensitive      SENSITIVE0.06    * GROUP B STREP(S.AGALACTIAE)ISOLATED  Blood Culture ID Panel (Reflexed)     Status: Abnormal   Collection Time: 06/01/21  4:42 PM  Result Value Ref Range Status   Enterococcus faecalis NOT DETECTED NOT DETECTED Final   Enterococcus Faecium NOT DETECTED NOT DETECTED Final   Listeria monocytogenes NOT DETECTED NOT DETECTED Final   Staphylococcus species NOT DETECTED NOT DETECTED Final   Staphylococcus aureus (BCID) NOT DETECTED NOT DETECTED Final   Staphylococcus epidermidis NOT DETECTED NOT DETECTED Final   Staphylococcus lugdunensis NOT DETECTED NOT DETECTED Final   Streptococcus species DETECTED (A) NOT DETECTED Final     Comment: CRITICAL RESULT CALLED TO, READ BACK BY AND VERIFIED WITH: NATHAN BELUE PHARMD AT 0400 06/02/2021 [place]    Streptococcus agalactiae DETECTED (A) NOT DETECTED Final    Comment: CRITICAL RESULT CALLED TO, READ BACK BY AND VERIFIED WITH: NATHAN BELUE PHARMD AT 0400 06/02/2021 GA    Streptococcus pneumoniae NOT DETECTED NOT DETECTED Final   Streptococcus pyogenes NOT DETECTED NOT DETECTED Final   A.calcoaceticus-baumannii NOT DETECTED NOT DETECTED Final   Bacteroides fragilis NOT DETECTED NOT DETECTED Final   Enterobacterales NOT DETECTED NOT DETECTED Final   Enterobacter cloacae complex NOT DETECTED NOT DETECTED Final   Escherichia coli NOT DETECTED NOT DETECTED Final   Klebsiella aerogenes NOT DETECTED NOT DETECTED Final   Klebsiella oxytoca NOT DETECTED NOT DETECTED Final   Klebsiella pneumoniae NOT DETECTED NOT DETECTED Final   Proteus species NOT DETECTED NOT DETECTED Final   Salmonella species NOT DETECTED NOT DETECTED Final   Serratia marcescens NOT DETECTED NOT DETECTED Final   Haemophilus influenzae NOT DETECTED NOT DETECTED Final   Neisseria meningitidis NOT DETECTED NOT DETECTED Final   Pseudomonas aeruginosa NOT DETECTED NOT DETECTED Final   Stenotrophomonas maltophilia NOT DETECTED NOT DETECTED Final   Candida albicans NOT DETECTED NOT DETECTED Final   Candida auris NOT DETECTED NOT DETECTED Final   Candida glabrata NOT DETECTED NOT DETECTED Final   Candida krusei NOT DETECTED NOT DETECTED Final   Candida parapsilosis NOT DETECTED NOT DETECTED Final   Candida tropicalis NOT DETECTED NOT DETECTED Final   Cryptococcus neoformans/gattii NOT DETECTED NOT DETECTED Final    Comment: Performed at [facility], [address] [place], [place] [address]  Resp Panel by RT-PCR (Flu  A&B, Covid) Nasopharyngeal Swab     Status: None   Collection Time: 06/01/21  4:55 PM   Specimen: Nasopharyngeal Swab; Nasopharyngeal(NP) swabs in vial transport medium  Result Value Ref  Range Status   SARS Coronavirus 2 by RT PCR NEGATIVE NEGATIVE Final    Comment: (NOTE) SARS-CoV-2 target nucleic acids are NOT DETECTED.  The SARS-CoV-2 RNA is generally detectable in upper respiratory specimens during the acute phase of infection. The lowest concentration of SARS-CoV-2 viral copies this assay can detect is 138 copies/mL. A negative result does not preclude SARS-Cov-2 infection and should not be used as the sole basis for treatment or other patient management decisions. A negative result may occur with  improper specimen collection/handling, submission of specimen other than nasopharyngeal swab, presence of viral mutation(s) within the areas targeted by this assay, and inadequate number of viral copies(<138 copies/mL). A negative result must be combined with clinical observations, patient history, and epidemiological information. The expected result is Negative.  Fact Sheet for Patients:  EntrepreneurPulse.com.au  Fact Sheet for Healthcare Providers:  IncredibleEmployment.be  This test is no t yet approved or cleared by the [place] FDA and  has been authorized for detection and/or diagnosis of SARS-CoV-2 by FDA under an Emergency Use Authorization (EUA). This EUA will remain  in effect (meaning this test can be used) for the duration of the COVID-19 declaration under Section 564(b)(1) of the Act, 21 U.S.C.section 360bbb-3(b)(1), unless the authorization is terminated  or revoked sooner.       Influenza A by PCR NEGATIVE NEGATIVE Final   Influenza B by PCR NEGATIVE NEGATIVE Final    Comment: (NOTE) The Xpert Xpress SARS-CoV-2/FLU/RSV plus assay is intended as an aid in the diagnosis of influenza from Nasopharyngeal swab specimens and should not be used as a sole basis for treatment. Nasal washings and aspirates are unacceptable for Xpert Xpress SARS-CoV-2/FLU/RSV testing.  Fact Sheet for  Patients: EntrepreneurPulse.com.au  Fact Sheet for Healthcare Providers: IncredibleEmployment.be  This test is not yet approved or cleared by the [place] FDA and has been authorized for detection and/or diagnosis of SARS-CoV-2 by FDA under an Emergency Use Authorization (EUA). This EUA will remain in effect (meaning this test can be used) for the duration of the COVID-19 declaration under Section 564(b)(1) of the Act, 21 U.S.C. section 360bbb-3(b)(1), unless the authorization is terminated or revoked.  Performed at [facility], [address] [place], [place] [address]   Urine Culture     Status: None   Collection Time: 06/01/21  4:55 PM   Specimen: Urine, Random  Result Value Ref Range Status   Specimen Description   Final    URINE, RANDOM Performed at [facility], [address] [place], [place] [address]    Special Requests   Final    NONE Performed at [facility], [address] [place], [place] [address]    Culture   Final    NO GROWTH Performed at [facility], [place] [address] [place], [place] [address]    Report Status 06/03/2021 FINAL  Final  Culture, blood (Routine X 2) w Reflex to ID Panel     Status: None (Preliminary result)   Collection Time: 06/01/21 11:03 PM   Specimen: BLOOD  Result Value Ref Range Status   Specimen Description BLOOD LEFT ANTECUBITAL  Final   Special Requests   Final    BOTTLES DRAWN AEROBIC AND ANAEROBIC Blood Culture results may not be optimal due to an  excessive volume of blood received in culture bottles   Culture   Final    NO GROWTH 4 DAYS Performed at [facility], [address] [place], [place] [address]    Report Status PENDING  Incomplete  CULTURE, BLOOD (ROUTINE X 2) w Reflex to ID Panel     Status: None (Preliminary result)   Collection Time: 06/03/21  3:48 PM   Specimen: BLOOD  Result Value Ref Range Status   Specimen  Description BLOOD RAC  Final   Special Requests BOTTLES DRAWN AEROBIC AND ANAEROBIC BCAV  Final   Culture   Final    NO GROWTH 2 DAYS Performed at [facility], [address] [place], [place] [address]    Report Status PENDING  Incomplete  CULTURE, BLOOD (ROUTINE X 2) w Reflex to ID Panel     Status: None (Preliminary result)   Collection Time: 06/03/21  4:03 PM   Specimen: BLOOD  Result Value Ref Range Status   Specimen Description BLOOD RT FIN  Final   Special Requests BOTTLES DRAWN AEROBIC AND ANAEROBIC BCAV  Final   Culture   Final    NO GROWTH 2 DAYS Performed at [facility], [address] [place], [place] [address]    Report Status PENDING  Incomplete    Labs: CBC: Recent Labs  Lab 06/01/21 1638 06/02/21 0613 06/03/21 0735 06/04/21 0419  WBC 11.3* 19.4* 13.8* 10.2  NEUTROABS 9.1* 17.3*  --   --   HGB 14.1 12.8* 12.0* 12.0*  HCT 42.7 38.8* 37.0* 37.1*  MCV 99.8 97.7 98.4 98.9  PLT 176 136* 137* 433*   Basic Metabolic Panel: Recent Labs  Lab 06/01/21 1638 06/02/21 0613 06/03/21 0756 06/04/21 0419 06/05/21 0431  NA 133* 137 133* 136 140  K 4.5 4.1 3.8 3.8 3.2*  CL 99 101 99 99 104  CO2 22 25 26 26 27   GLUCOSE 105* 134* 110* 116* 107*  BUN 11 15 11 11 10   CREATININE 1.31* 1.26* 1.01 0.85 0.92  CALCIUM 8.6* 8.4* 8.2* 8.6* 9.2  MG  --  1.8  --   --   --   PHOS  --  4.0  --   --   --    Liver Function Tests: Recent Labs  Lab 06/01/21 1638  AST 30  ALT 22  ALKPHOS 70  BILITOT 1.0  PROT 6.6  ALBUMIN 4.1   CBG: Recent Labs  Lab 06/01/21 1647  GLUCAP 112*    Discharge time spent: 40 minutes.  Signed: Edwin Dada, MD [facility] 06/05/2021

## 2021-06-05 NOTE — Progress Notes (Signed)
Progress Note  Patient Name: Willie Clark Date of Encounter: 06/05/2021  [facility] Cardiologist: [name], MD   Subjective   Patient is feeling better today, breathing is better. No chest pain, still some abdominal pain. I/Os not being recorded. Volume status improving.   Inpatient Medications    Scheduled Meds:  aspirin EC  81 mg Oral Daily   carvedilol  3.125 mg Oral BID WC   citalopram  20 mg Oral Daily   clopidogrel  75 mg Oral Daily   finasteride  5 mg Oral Daily   gabapentin  800 mg Oral TID   icosapent Ethyl  2 g Oral BID   potassium chloride  30 mEq Oral BID   rOPINIRole  1 mg Oral BID   rosuvastatin  40 mg Oral Daily   spironolactone  25 mg Oral Daily   Continuous Infusions:  cefTRIAXone (ROCEPHIN)  IV Stopped (06/04/21 0845)   metronidazole 500 mg (06/05/21 0540)   PRN Meds: acetaminophen **OR** acetaminophen, dicyclomine, labetalol, nitroGLYCERIN, ondansetron **OR** ondansetron (ZOFRAN) IV, oxyCODONE   Vital Signs    Vitals:   06/04/21 1518 06/04/21 2045 06/05/21 0356 06/05/21 0803  BP: 121/73 109/69 119/60 122/65  Pulse: (!) 59 60 (!) 56 (!) 54  Resp: 16 18 17    Temp: 98.7 F (37.1 C) 98.7 F (37.1 C) 98.5 F (36.9 C) 98.1 F (36.7 C)  TempSrc: Oral Oral    SpO2: 99% 98% 96% 98%  Weight:      Height:        Intake/Output Summary (Last 24 hours) at 06/05/2021 0838 Last data filed at 06/05/2021 0153 Gross per 24 hour  Intake 300 ml  Output --  Net 300 ml   Last 3 Weights 06/02/2021 06/01/2021 05/23/2021  Weight (lbs) 207 lb 3.2 oz 207 lb 10.8 oz 207 lb 11.2 oz  Weight (kg) 93.985 kg 94.2 kg 94.212 kg      Telemetry    NSR, HR 50s, upper 40s, PVCs - Personally Reviewed  ECG    No new - Personally Reviewed  Physical Exam   GEN: No acute distress.   Neck: No JVD Cardiac: RRR, no murmurs, rubs, or gallops.  Respiratory: Clear to auscultation bilaterally. GI: Soft, nontender, non-distended  MS: minimal lower leg edema; No  deformity. Neuro:  Nonfocal  Psych: Normal affect   Labs    High Sensitivity Troponin:   Recent Labs  Lab 06/01/21 1638 06/01/21 1836  TROPONINIHS 14 139*     Chemistry Recent Labs  Lab 06/01/21 1638 06/02/21 0613 06/03/21 0756 06/04/21 0419 06/05/21 0431  NA 133* 137 133* 136 140  K 4.5 4.1 3.8 3.8 3.2*  CL 99 101 99 99 104  CO2 22 25 26 26 27   GLUCOSE 105* 134* 110* 116* 107*  BUN 11 15 11 11 10   CREATININE 1.31* 1.26* 1.01 0.85 0.92  CALCIUM 8.6* 8.4* 8.2* 8.6* 9.2  MG  --  1.8  --   --   --   PROT 6.6  --   --   --   --   ALBUMIN 4.1  --   --   --   --   AST 30  --   --   --   --   ALT 22  --   --   --   --   ALKPHOS 70  --   --   --   --   BILITOT 1.0  --   --   --   --  GFRNONAA 59* >60 >60 >60 >60  ANIONGAP 12 11 8 11 9     Lipids No results for input(s): CHOL, TRIG, HDL, LABVLDL, LDLCALC, CHOLHDL in the last 168 hours.  Hematology Recent Labs  Lab 06/02/21 0613 06/03/21 0735 06/04/21 0419  WBC 19.4* 13.8* 10.2  RBC 3.97* 3.76* 3.75*  HGB 12.8* 12.0* 12.0*  HCT 38.8* 37.0* 37.1*  MCV 97.7 98.4 98.9  MCH 32.2 31.9 32.0  MCHC 33.0 32.4 32.3  RDW 13.8 14.1 13.7  PLT 136* 137* 147*   Thyroid No results for input(s): TSH, FREET4 in the last 168 hours.  BNP Recent Labs  Lab 06/01/21 2028  BNP 90.0    DDimer No results for input(s): DDIMER in the last 168 hours.   Radiology    US Venous Img Lower Unilateral Left (DVT)  Result Date: 06/04/2021 CLINICAL DATA:  Left lower extremity swelling for 3 weeks EXAM: LEFT LOWER EXTREMITY VENOUS DOPPLER ULTRASOUND TECHNIQUE: Gray-scale sonography with graded compression, as well as color Doppler and duplex ultrasound were performed to evaluate the lower extremity deep venous systems from the level of the common femoral vein and including the common femoral, femoral, profunda femoral, popliteal and calf veins including the posterior tibial, peroneal and gastrocnemius veins when visible. The superficial great  saphenous vein was also interrogated. Spectral Doppler was utilized to evaluate flow at rest and with distal augmentation maneuvers in the common femoral, femoral and popliteal veins. COMPARISON:  None. FINDINGS: Contralateral Common Femoral Vein: Respiratory phasicity is normal and symmetric with the symptomatic side. No evidence of thrombus. Normal compressibility. Common Femoral Vein: No evidence of thrombus. Normal compressibility, respiratory phasicity and response to augmentation. Saphenofemoral Junction: No evidence of thrombus. Normal compressibility and flow on color Doppler imaging. Profunda Femoral Vein: No evidence of thrombus. Normal compressibility and flow on color Doppler imaging. Femoral Vein: No evidence of thrombus. Normal compressibility, respiratory phasicity and response to augmentation. Popliteal Vein: No evidence of thrombus. Normal compressibility, respiratory phasicity and response to augmentation. Calf Veins: No evidence of thrombus. Normal compressibility and flow on color Doppler imaging. Other Findings:  Peripheral subcutaneous calf edema noted. IMPRESSION: No evidence of deep venous thrombosis. Electronically Signed   By: Jerilynn Mages.  Shick M.D.   On: 06/04/2021 16:09    Cardiac Studies     Echo 06/03/21 1. Septal-lateral dyssynchrony. Left ventricular ejection fraction, by  estimation, is 45 to 50%. The left ventricle has mildly decreased  function. The left ventricle demonstrates global hypokinesis. The left  ventricular internal cavity size was mildly  dilated. There is mild left ventricular hypertrophy of the lateral  segment. Left ventricular diastolic parameters are consistent with Grade I  diastolic dysfunction (impaired relaxation).   2. Right ventricular systolic function is normal. The right ventricular  size is moderately enlarged.   3. The mitral valve is normal in structure. Mild mitral valve  regurgitation. No evidence of mitral stenosis.   4. The aortic valve is  tricuspid. Aortic valve regurgitation is trivial.  No aortic stenosis is present.    2D echo 11/15/2020: 1. Challenging images, definity used   2. Left ventricular ejection fraction, by estimation, is 40 to 45 %. The  left ventricle has mildly decreased function. Mild global hypokinesis with  moderate anteroseptal wall hypokinesis possibly secondary to postoperative  state, and inferior wall  hypokinesis.   3. Right ventricular systolic function is normal. The right ventricular  size is normal. There is normal pulmonary artery systolic pressure. The  estimated right ventricular systolic  pressure is 24.9 mmHg.   4. Left atrial size was mildly dilated.   5. The mitral valve is normal in structure. Mild mitral valve  regurgitation. __________   [facility] 11/15/2020: Conclusions: Severe three-vessel coronary artery disease including chronic total occlusions of the ostial LAD, OM1, and proximal RCA. Patent LCx stent with moderate in-stent restenosis (~50%), similar to prior catheterizations. Widely patent LIMA to LAD. Patent SVG to RPDA with patent proximal/mid graft stents that demonstrate mild in-stent restenosis (~10%).  Distal anastomotic stent is widely patent.  There is significant ectasia in the midportion of the graft. Patent SVG to OM with sequential 75% and 60% ostial and mid graft stenoses. Upper normal to mildly elevated left and right heart filling pressures. Normal pulmonary artery pressure. Mildly reduced Fick cardiac output/index. Successful PCI to SVG to OM with nonoverlapping Resolute Onyx 3.0 x 12 mm (ostial) and 3.0 x 15 mm (mid graft) drug-eluting stents with 0% residual stenosis and TIMI-3 flow.   Recommendations: Overnight observation. Continue dual antiplatelet therapy with aspirin and ticagrelor for at least 12 months, ideally longer.  We will plan to CYP2C19 genotype to see if he is an appropriate clopidogrel responder in case dyspnea persists and alternative therapies  need to be considered. Aggressive secondary prevention. Obtain echocardiogram. Optimize goal-directed medical therapy for chronic HFrEF that appears relatively well compensated at this time based on RHC findings.    Patient Profile     71 y.o. male with CAD status post CABG (1990 06/2005) multiple PCI, chronic systolic and diastolic heart failure, ischemic heart myopathy, gastric ulcer, hypertension, hyperlipidemia, OSA on CPAP, prior CVA, prior PE, Barrett's esophagus, and diverticulitis admitted with chest pain.   Assessment & Plan    NSTEMI CAD s/p CABG/PCI - mildly elevated troponin in the setting of severe sepsis, diverticulitis, AKI - HS trop peak 139 - Last cath was 10/2020,report above, with PCI performed - s/p IV heparin x 48 hours - Echo ordered LVEF 45-50%, global HK. G1DD - EKG showed Sr with old TWI inferolateral leads  - suspect troponin elevation suspected demand ischemia, no further ischemic work-up planned. - continue Aspirin, Plavix, Coreg, statin   Chronic systolic and diastolic heart failure ICM - Echo this admission showed LVEF 45-50%, global HK, G1DD, moderately enlarged RV , mild MR - Echo in July 2022 showed LVEF 40-45% - PTA torsemide, spiro, Losartan held - IV lasix 40mg  BID - I/Os  not accurate - continue with diuresis - spironolactone restarted - kidney function stable   HTN - BP good on coreg  Sepsis Diverticulitis Bacteremia - per IM  For questions or updates, please contact [facility] Please consult [email or web address] for contact info under        Signed, Lessly Stigler Ninfa Meeker, PA-C  06/05/2021, 8:38 AM

## 2021-06-06 ENCOUNTER — Telehealth: Payer: Self-pay

## 2021-06-06 LAB — CULTURE, BLOOD (ROUTINE X 2): Culture: NO GROWTH

## 2021-06-06 NOTE — Telephone Encounter (Signed)
L MOM to schedule hosp f/u

## 2021-06-06 NOTE — Telephone Encounter (Signed)
-----   Message from Melvin, PA-C sent at 06/05/2021 12:17 PM EST ----- Regarding: hospital follow-up Patient needs hospital follow-up in 2-3 weeks. Thanks!

## 2021-06-07 ENCOUNTER — Telehealth: Payer: Self-pay

## 2021-06-07 NOTE — Telephone Encounter (Signed)
LMOM

## 2021-06-08 LAB — CULTURE, BLOOD (ROUTINE X 2)
Culture: NO GROWTH
Culture: NO GROWTH

## 2021-06-11 ENCOUNTER — Other Ambulatory Visit: Payer: Self-pay

## 2021-06-11 ENCOUNTER — Encounter: Payer: Medicare HMO | Admitting: *Deleted

## 2021-06-11 DIAGNOSIS — I5022 Chronic systolic (congestive) heart failure: Secondary | ICD-10-CM | POA: Diagnosis not present

## 2021-06-11 NOTE — Progress Notes (Signed)
Daily Session Note  Patient Details  Name: Willie Clark MRN: [id number] Date of Birth: Sep 16, 1950 Referring Provider:   Flowsheet Row Pulmonary Rehab from 05/23/2021 in [facility] Card[facility] Pul[facility] Re[facility]  Referring Provider [name] MD       Encounter Date: 06/11/2021  Check In:  Session Check In - 06/11/21 0937       Check-In   Supervising physician immediately available to respond to emergencies See telemetry face sheet for immediately available ER MD    Location [facility]-Cardiac & Pulmonary Rehab    Staff Present Heath Lark, RN, BSN, Laveda Norman, BS, ACSM CEP, Exercise Physiologist;Kara Eliezer Bottom, MS, ASCM CEP, Exercise Physiologist    Virtual Visit No    Medication changes reported     No    Fall or balance concerns reported    No    Warm-up and Cool-down Performed on first and last piece of equipment    Resistance Training Performed Yes    VAD Patient? No    PAD/SET Patient? No      Pain Assessment   Currently in Pain? No/denies                Social History   Tobacco Use  Smoking Status Former   Packs/day: 2.50   Types: Cigarettes   Quit date: 08/21/1991   Years since quitting: 29.8  Smokeless Tobacco Never    Goals Met:  Proper associated with RPD/PD & O2 Sat Independence with exercise equipment Exercise tolerated well No report of concerns or symptoms today  Goals Unmet:  Not Applicable  Comments: Pt able to follow exercise prescription today without complaint.  Will continue to monitor for progression.    Dr. Emily Filbert is Medical Director for [place].  Dr. Ottie Glazier is Medical Director for [facility] Pulmonary Rehabilitation.

## 2021-06-13 ENCOUNTER — Other Ambulatory Visit: Payer: Self-pay

## 2021-06-13 ENCOUNTER — Encounter: Payer: Self-pay | Admitting: *Deleted

## 2021-06-13 DIAGNOSIS — I5022 Chronic systolic (congestive) heart failure: Secondary | ICD-10-CM | POA: Diagnosis not present

## 2021-06-13 NOTE — Progress Notes (Signed)
Daily Session Note  Patient Details  Name: Willie Clark MRN: [id number] Date of Birth: 29-Apr-1950 Referring Provider:   Flowsheet Row Pulmonary Rehab from 05/23/2021 in [facility] Cardiac [facility] Pulmonary Rehab  Referring Provider [name] MD       Encounter Date: 06/13/2021  Check In:  Session Check In - 06/13/21 0954       Check-In   Supervising physician immediately available to respond to emergencies See telemetry face sheet for immediately available ER MD    Location [facility]-Cardiac & Pulmonary Rehab    Staff Present Birdie Sons, MPA, Elveria Rising, BA, ACSM CEP, Exercise Physiologist;Joseph Tessie Fass, [place]    Virtual Visit No    Medication changes reported     No    Fall or balance concerns reported    No    Tobacco Cessation No Change    Warm-up and Cool-down Performed on first and last piece of equipment    Resistance Training Performed Yes    VAD Patient? No    PAD/SET Patient? No      Pain Assessment   Currently in Pain? No/denies                Social History   Tobacco Use  Smoking Status Former   Packs/day: 2.50   Types: Cigarettes   Quit date: 08/21/1991   Years since quitting: 29.8  Smokeless Tobacco Never    Goals Met:  Independence with exercise equipment Exercise tolerated well No report of concerns or symptoms today Strength training completed today  Goals Unmet:   Not Applicable  Comments: Pt able to follow exercise prescription today without complaint.  Will continue to monitor for progression.    Dr. Emily Filbert is Medical Director for [place].  Dr. Ottie Glazier is Medical Director for [facility] Pulmonary Rehabilitation.

## 2021-06-13 NOTE — Progress Notes (Signed)
Pulmonary Individual Treatment Plan  Patient Details  Name: Willie Clark MRN: [id number] Date of Birth: Mar 12, 1951 Referring Provider:   Flowsheet Row Pulmonary Rehab from 05/23/2021 in [facility] Cardiac and Pulmonary Rehab  Referring Provider [name] MD       Initial Encounter Date:  Flowsheet Row Pulmonary Rehab from 05/23/2021 in [name] Cardiac and Pulmonary Rehab  Date 05/23/21       Visit Diagnosis: Heart failure, chronic systolic ([place])  Patient's Home Medications on Admission:  Current Outpatient Medications:    albuterol (PROVENTIL HFA;VENTOLIN HFA) 108 (90 Base) MCG/ACT inhaler, Inhale into the lungs every 4 (four) hours as needed for wheezing or shortness of breath., Disp: , Rfl:    amoxicillin-clavulanate (AUGMENTIN) 875-125 MG tablet, Take 1 tablet by mouth 2 (two) times daily for 10 days., Disp: 20 tablet, Rfl: 0   aspirin EC 81 MG EC tablet, Take 1 tablet (81 mg total) by mouth daily. Swallow whole., Disp: 30 tablet, Rfl: 11   carvedilol (COREG) 3.125 MG tablet, TAKE 1 TABLET (3.125 MG TOTAL) BY MOUTH 2 (TWO) TIMES DAILY WITH A MEAL., Disp: 180 tablet, Rfl: 0   citalopram (CELEXA) 20 MG tablet, Take 20 mg by mouth daily. , Disp: , Rfl:    clopidogrel (PLAVIX) 75 MG tablet, TAKE (1) TABLET 75 MG DAILY., Disp: 90 tablet, Rfl: 3   dicyclomine (BENTYL) 10 MG capsule, TAKE 1 CAPSULE THREE TIMES DAILY AS NEEDED, Disp: , Rfl:    docusate calcium (SURFAK) 240 MG capsule, Take 240 mg by mouth daily., Disp: , Rfl:    empagliflozin (JARDIANCE) 10 MG TABS tablet, Take 1 tablet (10 mg total) by mouth daily before breakfast., Disp: 90 tablet, Rfl: 3   EPINEPHrine 0.3 mg/0.3 mL IJ SOAJ injection, Inject 0.3 mg into the muscle as needed. , Disp: , Rfl:    fexofenadine (ALLEGRA) 180 MG tablet, Take 180 mg by mouth daily., Disp: , Rfl:    finasteride (PROSCAR) 5 MG tablet, Take 5 mg by mouth daily., Disp: , Rfl:    furosemide (LASIX) 20 MG tablet, Take 20 mg by mouth daily., Disp: , Rfl:     gabapentin (NEURONTIN) 800 MG tablet, Take 1 tablet by mouth 3 (three) times daily., Disp: , Rfl:    isosorbide mononitrate (IMDUR) 60 MG 24 hr tablet, Take 0.5 tablets (30 mg total) by mouth daily., Disp: 90 tablet, Rfl: 3   KLOR-CON M20 20 MEQ tablet, TAKE 1 TABLET BY MOUTH EVERY DAY, Disp: 90 tablet, Rfl: 0   mesalamine (APRISO) 0.375 g 24 hr capsule, Take 1.4 mg by mouth daily., Disp: , Rfl:    Multiple Vitamins-Minerals (HAIR SKIN AND NAILS FORMULA PO), Take 1 capsule by mouth daily., Disp: , Rfl:    NALTREXONE HCL PO, Take 2.5 mg by mouth daily., Disp: , Rfl:    nitroGLYCERIN (NITROSTAT) 0.4 MG SL tablet, Place 1 tablet (0.4 mg total) under the tongue every 5 (five) minutes as needed for chest pain., Disp: 90 tablet, Rfl: 1   nystatin-triamcinolone ointment (MYCOLOG), Apply 1 application topically 2 (two) times daily., Disp: 30 g, Rfl: 0   ondansetron (ZOFRAN) 4 MG tablet, Take 4 mg by mouth every 8 (eight) hours as needed for nausea or vomiting., Disp: , Rfl:    oxyCODONE (OXY IR/ROXICODONE) 5 MG immediate release tablet, 0.5-1 mg every 6 (six) hours as needed., Disp: , Rfl:    pantoprazole (PROTONIX) 40 MG tablet, Take 40 mg by mouth 2 (two) times daily. , Disp: , Rfl:  Peppermint Oil (IBGARD PO), Take by mouth daily at 2 am., Disp: , Rfl:    Probiotic Product (PROBIOTIC PO), Take by mouth daily at 8 pm., Disp: , Rfl:    rOPINIRole (REQUIP) 1 MG tablet, Take 1 mg by mouth 2 (two) times daily., Disp: , Rfl:    rosuvastatin (CRESTOR) 40 MG tablet, TAKE 1 TABLET BY MOUTH EVERY DAY, Disp: 90 tablet, Rfl: 3   spironolactone (ALDACTONE) 25 MG tablet, TAKE 1 TABLET EVERY DAY, Disp: 90 tablet, Rfl: 1   torsemide (DEMADEX) 20 MG tablet, Take 2 tablets (40 mg total) by mouth daily., Disp: 180 tablet, Rfl: 2   traMADol (ULTRAM) 50 MG tablet, Take 50 mg by mouth every 6 (six) hours as needed., Disp: , Rfl:    VASCEPA 1 g capsule, TAKE 2 CAPSULES TWICE DAILY, Disp: 360 capsule, Rfl: 1   vitamin  B-12 (CYANOCOBALAMIN) 1000 MCG tablet, Take 1,000 mcg by mouth daily., Disp: , Rfl:   Past Medical History: Past Medical History:  Diagnosis Date   Abnormal stress test    Anxiety    Arthritis    Barrett's esophagus    Bowel obstruction ([place]) 02/2009   small bowel   CHF (congestive heart failure) (HCC)    Chronic chest pain    Coronary artery disease    a. 1993 CABG; b. 2007 Redo CABG; c. 09/2015 Cath: LM mild dzs, LAD 100ost, RI mild dzs, LCX 60p, OM1 100, RCA 100ost, VG->OM2 min irregs, LIMA->LAD nl, VG->RPDA 30ost, 20d, nl EF->Med rx.   Depression    Diverticulitis    Diverticulosis    Erectile dysfunction    Fibromyalgia    Gastric ulcer    Gastritis    GERD (gastroesophageal reflux disease)    Headache    Hemorrhoids    HFrEF (heart failure with reduced ejection fraction) ([place])    a. 09/2016 Echo: EF 55-60%, no rwma, triv AI, mild MR, mildly dil LA. -> as of April 2020: EF 40 to 45%.  Septal dyssynergy/hypokinesis due to postop state but otherwise unable to assess wall motion [name] due to poor study.   History of myocardial infarct at age less than 2 years    Hyperlipidemia    Hypertension    Kidney stones    going to [facility]  June 26 to see kidney specialist   Lightheadedness    Mass on back    Nephrolithiasis    Orthostatic hypotension    a. Improved after discontinuation of metoprolol.   OSA (obstructive sleep apnea)    a. On CPAP.   Pulmonary embolism ([place]) 01/2006   Post-op/treated   Skin lesions, generalized    Stroke [facility]) 1993   right side weakness no blood thinners   on Aspirin   Syncope and collapse    TIA (transient ischemic attack) 08/13/2014   Urticaria     Tobacco Use: Social History   Tobacco Use  Smoking Status Former   Packs/day: 2.50   Types: Cigarettes   Quit date: 08/21/1991   Years since quitting: 29.8  Smokeless Tobacco Never    Labs: Recent Review Flowsheet Data     Labs for ITP Cardiac and Pulmonary Rehab Latest Ref Rng &  Units 12/11/2019 12/13/2019 02/07/2020 11/14/2020 06/01/2021   Cholestrol 0 - 200 mg/dL - 181 165 - -   LDLCALC 0 - 99 mg/dL - 84 61 - -   HDL >40 mg/dL - 35(L) 41 - -   Trlycerides <150 mg/dL - 312(H) 317(H) - -  Hemoglobin A1c 4.8 - 5.6 % 6.0(H) - - 6.1(H) -   HCO3 20.0 - 28.0 mmol/L - - - - 32.3(H)   O2SAT % - - - - 71.3        Pulmonary Assessment Scores:  Pulmonary Assessment Scores     Row Name 05/23/21 1450         ADL UCSD   ADL Phase Entry     SOB Score total 40     Rest 1     Walk 3     Stairs 3     Bath 2     Dress 2     Shop 0       CAT Score   CAT Score 13       mMRC Score   mMRC Score 1              UCSD: Self-administered rating of dyspnea associated with activities of daily living (ADLs) 6-point scale (0 = "not at all" to 5 = "maximal or unable to do because of breathlessness")  Scoring Scores range from 0 to 120.  Minimally important difference is 5 units  CAT: CAT can identify the health impairment of COPD patients and is better correlated with disease progression.  CAT has a scoring range of zero to 40. The CAT score is classified into four groups of low (less than 10), medium (10 - 20), high (21-30) and very high (31-40) based on the impact level of disease on health status. A CAT score over 10 suggests significant symptoms.  A worsening CAT score could be explained by an exacerbation, poor medication adherence, poor inhaler technique, or progression of COPD or comorbid conditions.  CAT MCID is 2 points  mMRC: mMRC (Modified Medical Research Council) Dyspnea Scale is used to assess the degree of baseline functional disability in patients of respiratory disease due to dyspnea. No minimal important difference is established. A decrease in score of 1 point or greater is considered a positive change.   Pulmonary Function Assessment:   Exercise Target Goals: Exercise Program Goal: Individual exercise prescription set using results from initial 6  min walk test and THRR while considering  patients activity barriers and safety.   Exercise Prescription Goal: Initial exercise prescription builds to 30-45 minutes a day of aerobic activity, 2-3 days per week.  Home exercise guidelines will be given to patient during program as part of exercise prescription that the participant will acknowledge.  Education: Aerobic Exercise: - Group verbal and visual presentation on the components of exercise prescription. Introduces F.I.T.T principle from ACSM for exercise prescriptions.  Reviews F.I.T.T. principles of aerobic exercise including progression. Written material given at graduation. Flowsheet Row Cardiac Rehab from 09/13/2020 in [facility] Cardiac and Pulmonary Rehab  Education need identified 07/12/20  Date 08/09/20  Educator AS  Instruction Review Code 1- Verbalizes Understanding       Education: Resistance Exercise: - Group verbal and visual presentation on the components of exercise prescription. Introduces F.I.T.T principle from ACSM for exercise prescriptions  Reviews F.I.T.T. principles of resistance exercise including progression. Written material given at graduation. Flowsheet Row Cardiac Rehab from 09/13/2020 in [facility] Cardiac and Pulmonary Rehab  Date 08/16/20  Educator AS  Instruction Review Code 1- Verbalizes Understanding        Education: Exercise & Equipment Safety: - Individual verbal instruction and demonstration of equipment use and safety with use of the equipment. Flowsheet Row Pulmonary Rehab from 05/30/2021 in [facility] Cardiac and Pulmonary Rehab  Date 05/23/21  Educator [facility]  Instruction Review Code 1- Verbalizes Understanding       Education: Exercise Physiology & General Exercise Guidelines: - Group verbal and written instruction with models to review the exercise physiology of the cardiovascular system and associated critical values. Provides general exercise guidelines with specific guidelines to those with heart or lung  disease.  Flowsheet Row Cardiac Rehab from 09/13/2020 in [facility] Cardiac and Pulmonary Rehab  Date 08/02/20  Educator [facility]  Instruction Review Code 1- Verbalizes Understanding       Education: Flexibility, Balance, Mind/Body Relaxation: - Group verbal and visual presentation with interactive activity on the components of exercise prescription. Introduces F.I.T.T principle from ACSM for exercise prescriptions. Reviews F.I.T.T. principles of flexibility and balance exercise training including progression. Also discusses the mind body connection.  Reviews various relaxation techniques to help reduce and manage stress (i.e. Deep breathing, progressive muscle relaxation, and visualization). Balance handout provided to take home. Written material given at graduation. Flowsheet Row Cardiac Rehab from 01/17/2021 in [facility] Cardiac and Pulmonary Rehab  Education need identified 12/12/20       Activity Barriers & Risk Stratification:  Activity Barriers & Cardiac Risk Stratification - 05/23/21 1444       Activity Barriers & Cardiac Risk Stratification   Activity Barriers Fibromyalgia;Deconditioning;Shortness of Breath;Balance Concerns;Back Problems;Neck/Spine Problems;Arthritis;Muscular Weakness;Decreased Ventricular Function             6 Minute Walk:  6 Minute Walk     Row Name 05/23/21 1442         6 Minute Walk   Phase Initial     Distance 1460 feet     Walk Time 6 minutes     # of Rest Breaks 0     MPH 2.77     METS 3.02     RPE 13     Perceived Dyspnea  2     VO2 Peak 10.56     Symptoms Yes (comment)     Comments SOB     Resting HR 60 bpm     Resting BP 136/76     Resting Oxygen Saturation  94 %     Exercise Oxygen Saturation  during 6 min walk 93 %     Max Ex. HR 84 bpm     Max Ex. BP 146/72     2 Minute Post BP 122/64       Interval HR   1 Minute HR 74     2 Minute HR 80     3 Minute HR 83     4 Minute HR 83     5 Minute HR 83     6 Minute HR 84     2 Minute Post HR  57     Interval Heart Rate? Yes       Interval Oxygen   Interval Oxygen? Yes     Baseline Oxygen Saturation % 94 %     1 Minute Oxygen Saturation % 94 %     1 Minute Liters of Oxygen 0 L  Room Air     2 Minute Oxygen Saturation % 94 %     2 Minute Liters of Oxygen 0 L     3 Minute Oxygen Saturation % 94 %     3 Minute Liters of Oxygen 0 L     4 Minute Oxygen Saturation % 93 %     4 Minute Liters of Oxygen 0 L     5 Minute Oxygen Saturation %  93 %     5 Minute Liters of Oxygen 0 L     6 Minute Oxygen Saturation % 93 %     6 Minute Liters of Oxygen 0 L     2 Minute Post Oxygen Saturation % 96 %     2 Minute Post Liters of Oxygen 0 L             Oxygen Initial Assessment:  Oxygen Initial Assessment - 05/23/21 1449       Home Oxygen   Home Oxygen Device None    Sleep Oxygen Prescription None    Home Exercise Oxygen Prescription None    Home Resting Oxygen Prescription None    Compliance with Home Oxygen Use Yes      Initial 6 min Walk   Oxygen Used None      Program Oxygen Prescription   Program Oxygen Prescription None      Intervention   Short Term Goals To learn and demonstrate proper pursed lip breathing techniques or other breathing techniques. ;To learn and demonstrate proper use of respiratory medications;To learn and understand importance of monitoring SPO2 with pulse oximeter and demonstrate accurate use of the pulse oximeter.;To learn and understand importance of maintaining oxygen saturations>88%    Long  Term Goals Exhibits proper breathing techniques, such as pursed lip breathing or other method taught during program session;Compliance with respiratory medication;Demonstrates proper use of MDIs;Maintenance of O2 saturations>88%;Verbalizes importance of monitoring SPO2 with pulse oximeter and return demonstration             Oxygen Re-Evaluation:  Oxygen Re-Evaluation     Row Name 05/25/21 0951             Goals/Expected Outcomes   Short Term  Goals To learn and demonstrate proper pursed lip breathing techniques or other breathing techniques. ;To learn and demonstrate proper use of respiratory medications;To learn and understand importance of monitoring SPO2 with pulse oximeter and demonstrate accurate use of the pulse oximeter.;To learn and understand importance of maintaining oxygen saturations>88%       Long  Term Goals Exhibits proper breathing techniques, such as pursed lip breathing or other method taught during program session;Compliance with respiratory medication;Demonstrates proper use of MDIs;Maintenance of O2 saturations>88%;Verbalizes importance of monitoring SPO2 with pulse oximeter and return demonstration       Comments Reviewed PLB technique with pt.  Talked about how it works and it's importance in maintaining their exercise saturations.       Goals/Expected Outcomes Short: Become more profiecient at using PLB.   Long: Become independent at using PLB.                Oxygen Discharge (Final Oxygen Re-Evaluation):  Oxygen Re-Evaluation - 05/25/21 0951       Goals/Expected Outcomes   Short Term Goals To learn and demonstrate proper pursed lip breathing techniques or other breathing techniques. ;To learn and demonstrate proper use of respiratory medications;To learn and understand importance of monitoring SPO2 with pulse oximeter and demonstrate accurate use of the pulse oximeter.;To learn and understand importance of maintaining oxygen saturations>88%    Long  Term Goals Exhibits proper breathing techniques, such as pursed lip breathing or other method taught during program session;Compliance with respiratory medication;Demonstrates proper use of MDIs;Maintenance of O2 saturations>88%;Verbalizes importance of monitoring SPO2 with pulse oximeter and return demonstration    Comments Reviewed PLB technique with pt.  Talked about how it works and it's importance in maintaining their exercise saturations.  Goals/Expected  Outcomes Short: Become more profiecient at using PLB.   Long: Become independent at using PLB.             Initial Exercise Prescription:  Initial Exercise Prescription - 05/23/21 1400       Date of Initial Exercise RX and Referring Provider   Date 05/23/21    Referring Provider [name] MD      Oxygen   Maintain Oxygen Saturation 88% or higher      Treadmill   MPH 2.5    Grade 0.5    Minutes 15    METs 3.09      Arm Ergometer   Level 1    Watts 25    RPM 25    Minutes 15    METs 2      REL-XR   Level 3    Speed 50    Minutes 15    METs 3      Track   Laps 36    Minutes 15    METs 2.96      Prescription Details   Frequency (times per week) 3    Duration Progress to 30 minutes of continuous aerobic without signs/symptoms of physical distress      Intensity   THRR 40-80% of Max Heartrate 96-132    Ratings of Perceived Exertion 11-13    Perceived Dyspnea 0-4      Progression   Progression Continue to progress workloads to maintain intensity without signs/symptoms of physical distress.      Resistance Training   Training Prescription Yes    Weight 4 lb    Reps 10-15             Perform Capillary Blood Glucose checks as needed.  Exercise Prescription Changes:   Exercise Prescription Changes     Row Name 05/23/21 1400 06/04/21 1200           Response to Exercise   Blood Pressure (Admit) 136/76 128/64      Blood Pressure (Exercise) 146/72 126/62      Blood Pressure (Exit) 112/60 122/60      Heart Rate (Admit) 60 bpm 60 bpm      Heart Rate (Exercise) 84 bpm 99 bpm      Heart Rate (Exit) 63 bpm 62 bpm      Oxygen Saturation (Admit) 94 % 95 %      Oxygen Saturation (Exercise) 93 % 93 %      Oxygen Saturation (Exit) 98 % 94 %      Rating of Perceived Exertion (Exercise) 13 13      Perceived Dyspnea (Exercise) 2 2      Symptoms SOB SOB      Comments walk test results 3rd full day of exercise      Duration -- Continue with 30 min of  aerobic exercise without signs/symptoms of physical distress.      Intensity -- THRR unchanged        Progression   Progression -- Continue to progress workloads to maintain intensity without signs/symptoms of physical distress.      Average METs -- 2.87        Resistance Training   Training Prescription -- Yes      Weight -- 4 lb      Reps -- 10-15        Interval Training   Interval Training -- No        Treadmill   MPH -- 2.6  Grade -- 3      Minutes -- 15      METs -- 4.07        Arm Ergometer   Level -- 1      METs -- 2.1        REL-XR   Level -- 4      Minutes -- 15        Track   Laps -- 36      Minutes -- 15      METs -- 2.96        Oxygen   Maintain Oxygen Saturation -- 88% or higher               Exercise Comments:   Exercise Goals and Review:   Exercise Goals     Row Name 05/23/21 1447             Exercise Goals   Increase Physical Activity Yes       Intervention Provide advice, education, support and counseling about physical activity/exercise needs.;Develop an individualized exercise prescription for aerobic and resistive training based on initial evaluation findings, risk stratification, comorbidities and participant's personal goals.       Expected Outcomes Short Term: Attend rehab on a regular basis to increase amount of physical activity.;Long Term: Add in home exercise to make exercise part of routine and to increase amount of physical activity.;Long Term: Exercising regularly at least 3-5 days a week.       Increase Strength and Stamina Yes       Intervention Provide advice, education, support and counseling about physical activity/exercise needs.;Develop an individualized exercise prescription for aerobic and resistive training based on initial evaluation findings, risk stratification, comorbidities and participant's personal goals.       Expected Outcomes Short Term: Increase workloads from initial exercise prescription for  resistance, speed, and METs.;Short Term: Perform resistance training exercises routinely during rehab and add in resistance training at home;Long Term: Improve cardiorespiratory fitness, muscular endurance and strength as measured by increased METs and functional capacity (6MWT)       Able to understand and use rate of perceived exertion (RPE) scale Yes       Intervention Provide education and explanation on how to use RPE scale       Expected Outcomes Short Term: Able to use RPE daily in rehab to express subjective intensity level;Long Term:  Able to use RPE to guide intensity level when exercising independently       Able to understand and use Dyspnea scale Yes       Intervention Provide education and explanation on how to use Dyspnea scale       Expected Outcomes Short Term: Able to use Dyspnea scale daily in rehab to express subjective sense of shortness of breath during exertion;Long Term: Able to use Dyspnea scale to guide intensity level when exercising independently       Knowledge and understanding of Target Heart Rate Range (THRR) Yes       Intervention Provide education and explanation of THRR including how the numbers were predicted and where they are located for reference       Expected Outcomes Short Term: Able to state/look up THRR;Long Term: Able to use THRR to govern intensity when exercising independently;Short Term: Able to use daily as guideline for intensity in rehab       Able to check pulse independently Yes       Intervention Provide education and demonstration on how to check  pulse in carotid and radial arteries.;Review the importance of being able to check your own pulse for safety during independent exercise       Expected Outcomes Short Term: Able to explain why pulse checking is important during independent exercise;Long Term: Able to check pulse independently and accurately       Understanding of Exercise Prescription Yes       Intervention Provide education, explanation,  and written materials on patient's individual exercise prescription       Expected Outcomes Short Term: Able to explain program exercise prescription;Long Term: Able to explain home exercise prescription to exercise independently                Exercise Goals Re-Evaluation :  Exercise Goals Re-Evaluation     Row Name 05/25/21 0948 06/04/21 1201           Exercise Goal Re-Evaluation   Exercise Goals Review Increase Physical Activity;Increase Strength and Stamina;Knowledge and understanding of Target Heart Rate Range (THRR);Able to understand and use rate of perceived exertion (RPE) scale Increase Physical Activity;Increase Strength and Stamina      Comments Reviewed RPE and dyspnea scales, THR and program prescription with pt today.  Pt voiced understanding and was given a copy of goals to take home. Willie Clark is doing well for the first couple of sessions that he has been here. He is repeating the program and is slowly working his way up to his previous loads.  He tolerated 36 laps on the track and is now up to 4 lbs for handweights. He has only hit his [place] 1 time since starting, however, his RPEs are appropriate range      Expected Outcomes Short: Use RPE daily to regulate intensity. Long: Follow program prescription in THR. Short: Try to hit [place]: Continue to increase overall MET level               Discharge Exercise Prescription (Final Exercise Prescription Changes):  Exercise Prescription Changes - 06/04/21 1200       Response to Exercise   Blood Pressure (Admit) 128/64    Blood Pressure (Exercise) 126/62    Blood Pressure (Exit) 122/60    Heart Rate (Admit) 60 bpm    Heart Rate (Exercise) 99 bpm    Heart Rate (Exit) 62 bpm    Oxygen Saturation (Admit) 95 %    Oxygen Saturation (Exercise) 93 %    Oxygen Saturation (Exit) 94 %    Rating of Perceived Exertion (Exercise) 13    Perceived Dyspnea (Exercise) 2    Symptoms SOB    Comments 3rd full day of exercise     Duration Continue with 30 min of aerobic exercise without signs/symptoms of physical distress.    Intensity THRR unchanged      Progression   Progression Continue to progress workloads to maintain intensity without signs/symptoms of physical distress.    Average METs 2.87      Resistance Training   Training Prescription Yes    Weight 4 lb    Reps 10-15      Interval Training   Interval Training No      Treadmill   MPH 2.6    Grade 3    Minutes 15    METs 4.07      Arm Ergometer   Level 1    METs 2.1      REL-XR   Level 4    Minutes 15      Track   Laps  36    Minutes 15    METs 2.96      Oxygen   Maintain Oxygen Saturation 88% or higher             Nutrition:  Target Goals: Understanding of nutrition guidelines, daily intake of sodium <1568m, cholesterol <2040m calories 30% from fat and 7% or less from saturated fats, daily to have 5 or more servings of fruits and vegetables.  Education: All About Nutrition: -Group instruction provided by verbal, written material, interactive activities, discussions, models, and posters to present general guidelines for heart healthy nutrition including fat, fiber, MyPlate, the role of sodium in heart healthy nutrition, utilization of the nutrition label, and utilization of this knowledge for meal planning. Follow up email sent as well. Written material given at graduation. [facility]  Education need identified 12/12/20       Biometrics:  Pre Biometrics - 05/23/21 1447       Pre Biometrics   Height 5' 10"  (1.778 m)    Weight 207 lb 11.2 oz (94.2 kg)    BMI (Calculated) 29.8    Single Leg Stand 21.3 seconds              Nutrition Therapy Plan and Nutrition Goals:  Nutrition Therapy & Goals - 05/28/21 1148       Personal Nutrition Goals   Nutrition Goal DaShanon Browas recently completed the program and would not like to meet with the RD at this  time. Will continue to follow up.             Nutrition Assessments:  MEDIFICTS Score Key: ?70 Need to make dietary changes  40-70 Heart Healthy Diet ? 40 Therapeutic Level Cholesterol Diet  [facility]  Picture Your Plate Total Score on Admission 54      Picture Your Plate Scores: <4<41nhealthy dietary pattern with much room for improvement. 41-50 Dietary pattern unlikely to meet recommendations for good health and room for improvement. 51-60 More healthful dietary pattern, with some room for improvement.  >60 Healthy dietary pattern, although there may be some specific behaviors that could be improved.   Nutrition Goals Re-Evaluation:   Nutrition Goals Discharge (Final Nutrition Goals Re-Evaluation):   Psychosocial: Target Goals: Acknowledge presence or absence of significant depression and/or stress, maximize coping skills, provide positive support system. Participant is able to verbalize types and ability to use techniques and skills needed for reducing stress and depression.   Education: Stress, Anxiety, and Depression - Group verbal and visual presentation to define topics covered.  Reviews how body is impacted by stress, anxiety, and depression.  Also discusses healthy ways to reduce stress and to treat/manage anxiety and depression.  Written material given at graduation. Flow[facility]  Education need identified 12/12/20       Education: Sleep Hygiene -Provides group verbal and written instruction about how sleep can affect your health.  Define sleep hygiene, discuss sleep cycles and impact of sleep habits. Review good sleep hygiene tips.    Initial Review & Psychosocial Screening:   Quality of Life Scores:  Scores of 19 and below usually indicate a poorer quality of life in these areas.  A difference of  2-3 points is a  clinically meaningful difference.  A difference of 2-3 points in the total score of the Quality of Life  Index has been associated with significant improvement in overall quality of life, self-image, physical symptoms, and general health in studies assessing change in quality of life.  PHQ-9: Recent Review Flowsheet Data     Depression screen [facility] 2/9 05/23/2021 02/28/2021 12/12/2020 08/16/2020 07/12/2020   Decreased Interest 1 2 2  0 3   Down, Depressed, Hopeless 2 0 1 0 1   PHQ - 2 Score 3 2 3  0 4   Altered sleeping 3 2 3 3 1    Tired, decreased energy 3 2 3 3 2    Change in appetite 0 1 0 1 0   Feeling bad or failure about yourself  0 0 1 0 0   Trouble concentrating 1 0 1 3 0   Moving slowly or fidgety/restless 0 1 1 0 1   Suicidal thoughts 0 0 0 0 0   PHQ-9 Score 10 8 12 10 8    Difficult doing work/chores Somewhat difficult Not difficult at all Somewhat difficult Somewhat difficult Somewhat difficult      Interpretation of Total Score  Total Score Depression Severity:  1-4 = Minimal depression, 5-9 = Mild depression, 10-14 = Moderate depression, 15-19 = Moderately severe depression, 20-27 = Severe depression   Psychosocial Evaluation and Intervention:  Psychosocial Evaluation - 05/08/21 0942       Psychosocial Evaluation & Interventions   Interventions Encouraged to exercise with the program and follow exercise prescription;Relaxation education    Comments Willie Clark has no barriers to attending the program. He has been through [employer] [facility] several times and is entering Pulmonary Rehab for Heart Failure. He is well versed in his Heart Failure routine of watching his weight and taking extra diurectic if his weight is over 2 lb change . He continues with Celexa and has no cnverns about depression today. He has stress in he continues to worry about his brother. His brother recently had an LVAD placement. Willie Clark encouraged his brother to enroll in Cardiac Rehab and Willie Clark got a Pulmonary Rehab referral  so he can be in class with his brother.  Willie Clark continues with his SOB symptoms and is hoping he can decrease the symptome as he progresses in the program.    Expected Outcomes STG: Attend all scheduled sessions. Progress with exercise prescription to aid with stress control and decrease SOB. LTG Willie Clark is able to continue his progress after discharge    Continue Psychosocial Services  Follow up required by staff             Psychosocial Re-Evaluation:   Psychosocial Discharge (Final Psychosocial Re-Evaluation):   Education: Education Goals: Education classes will be provided on a weekly basis, covering required topics. Participant will state understanding/return demonstration of topics presented.  Learning Barriers/Preferences:   General Pulmonary Education Topics:  Infection Prevention: - Provides verbal and written material to individual with discussion of infection control including proper hand washing and proper equipment cleaning during exercise session. Flowsheet Row Pulmonary Rehab from 05/30/2021 in [facility] Cardiac and Pulmonary Rehab  Date 05/23/21  Educator [facility]  Instruction Review Code 1- Verbalizes Understanding       Falls Prevention: - Provides verbal and written material to individual with discussion of falls prevention and safety. Flowsheet Row Pulmonary Rehab from 05/30/2021 in [facility] Cardiac and Pulmonary Rehab  Date 05/23/21  Educator [facility]  Instruction Review Code 1- Verbalizes Understanding       Chronic Lung Disease Review: - Group verbal instruction with posters, models, PowerPoint presentations and videos,  to review new updates,  new respiratory medications, new advancements in procedures and treatments. Providing information on websites and "800" numbers for continued self-education. Includes information about supplement oxygen, available portable oxygen systems, continuous and intermittent flow rates, oxygen safety, concentrators, and Medicare reimbursement for  oxygen. Explanation of Pulmonary Drugs, including class, frequency, complications, importance of spacers, rinsing mouth after steroid MDI's, and proper cleaning methods for nebulizers. Review of basic lung anatomy and physiology related to function, structure, and complications of lung disease. Review of risk factors. Discussion about methods for diagnosing sleep apnea and types of masks and machines for OSA. Includes a review of the use of types of environmental controls: home humidity, furnaces, filters, dust mite/pet prevention, HEPA vacuums. Discussion about weather changes, air quality and the benefits of nasal washing. Instruction on Warning signs, infection symptoms, calling MD promptly, preventive modes, and value of vaccinations. Review of effective airway clearance, coughing and/or vibration techniques. Emphasizing that all should Create an Action Plan. Written material given at graduation. Flowsheet Row Pulmonary Rehab from 05/30/2021 in [facility] Cardiac and Pulmonary Rehab  Education need identified 05/23/21  Date 05/30/21  Educator [facility]  Instruction Review Code 1- Verbalizes Understanding       AED/CPR: - Group verbal and written instruction with the use of models to demonstrate the basic use of the AED with the basic ABC's of resuscitation.    Anatomy and Cardiac Procedures: - Group verbal and visual presentation and models provide information about basic cardiac anatomy and function. Reviews the testing methods done to diagnose heart disease and the outcomes of the test results. Describes the treatment choices: Medical Management, Angioplasty, or Coronary Bypass Surgery for treating various heart conditions including Myocardial Infarction, Angina, Valve Disease, and Cardiac Arrhythmias.  Written material given at graduation. Flowsheet Row Cardiac Rehab from 09/13/2020 in [facility] Cardiac and Pulmonary Rehab  Education need identified 07/12/20  Date 08/16/20  Educator SB  Instruction Review  Code 1- Verbalizes Understanding       Medication Safety: - Group verbal and visual instruction to review commonly prescribed medications for heart and lung disease. Reviews the medication, class of the drug, and side effects. Includes the steps to properly store meds and maintain the prescription regimen.  Written material given at graduation. Flowsheet Row Cardiac Rehab from 01/17/2021 in [facility] Cardiac and Pulmonary Rehab  Date 01/03/21  Educator SB  Instruction Review Code 1- Verbalizes Understanding       Other: -Provides group and verbal instruction on various topics (see comments)   Knowledge Questionnaire Score:  Knowledge Questionnaire Score - 05/23/21 1448       Knowledge Questionnaire Score   Pre Score 14/18              Core Components/Risk Factors/Patient Goals at Admission:  Personal Goals and Risk Factors at Admission - 05/23/21 1449       Core Components/Risk Factors/Patient Goals on Admission    Weight Management Yes;Weight Loss    Intervention Weight Management: Develop a combined nutrition and exercise program designed to reach desired caloric intake, while maintaining appropriate intake of nutrient and fiber, sodium and fats, and appropriate energy expenditure required for the weight goal.;Weight Management: Provide education and appropriate resources to help participant work on and attain dietary goals.;Weight Management/Obesity: Establish reasonable short term and long term weight goals.    Admit Weight 207 lb 11.2 oz (94.2 kg)    Goal Weight: Short Term 200 lb (90.7 kg)    Goal Weight: Long Term 190 lb (86.2 kg)  Expected Outcomes Short Term: Continue to assess and modify interventions until short term weight is achieved;Long Term: Adherence to nutrition and physical activity/exercise program aimed toward attainment of established weight goal;Weight Loss: Understanding of general recommendations for a balanced deficit meal plan, which promotes 1-2 lb  weight loss per week and includes a negative energy balance of [phone] kcal/d;Understanding recommendations for meals to include 15-35% energy as protein, 25-35% energy from fat, 35-60% energy from carbohydrates, less than 288m of dietary cholesterol, 20-35 gm of total fiber daily;Understanding of distribution of calorie intake throughout the day with the consumption of 4-5 meals/snacks    Improve shortness of breath with ADL's Yes    Intervention Provide education, individualized exercise plan and daily activity instruction to help decrease symptoms of SOB with activities of daily living.    Expected Outcomes Short Term: Improve cardiorespiratory fitness to achieve a reduction of symptoms when performing ADLs;Long Term: Be able to perform more ADLs without symptoms or delay the onset of symptoms    Increase knowledge of respiratory medications and ability to use respiratory devices properly  Yes    Intervention Provide education and demonstration as needed of appropriate use of medications, inhalers, and oxygen therapy.    Expected Outcomes Short Term: Achieves understanding of medications use. Understands that oxygen is a medication prescribed by physician. Demonstrates appropriate use of inhaler and oxygen therapy.;Long Term: Maintain appropriate use of medications, inhalers, and oxygen therapy.    Heart Failure Yes    Intervention Provide a combined exercise and nutrition program that is supplemented with education, support and counseling about heart failure. Directed toward relieving symptoms such as shortness of breath, decreased exercise tolerance, and extremity edema.    Expected Outcomes Improve functional capacity of life;Short term: Attendance in program 2-3 days a week with increased exercise capacity. Reported lower sodium intake. Reported increased fruit and vegetable intake. Reports medication compliance.;Short term: Daily weights obtained and reported for increase. Utilizing diuretic  protocols set by physician.;Long term: Adoption of self-care skills and reduction of barriers for early signs and symptoms recognition and intervention leading to self-care maintenance.    Hypertension Yes    Intervention Provide education on lifestyle modifcations including regular physical activity/exercise, weight management, moderate sodium restriction and increased consumption of fresh fruit, vegetables, and low fat dairy, alcohol moderation, and smoking cessation.;Monitor prescription use compliance.    Expected Outcomes Short Term: Continued assessment and intervention until BP is < 140/978mHG in hypertensive participants. < 130/8043mG in hypertensive participants with diabetes, heart failure or chronic kidney disease.;Long Term: Maintenance of blood pressure at goal levels.    Lipids Yes    Intervention Provide education and support for participant on nutrition & aerobic/resistive exercise along with prescribed medications to achieve LDL <18m3mDL >40mg28m Expected Outcomes Short Term: Participant states understanding of desired cholesterol values and is compliant with medications prescribed. Participant is following exercise prescription and nutrition guidelines.;Long Term: Cholesterol controlled with medications as prescribed, with individualized exercise RX and with personalized nutrition plan. Value goals: LDL < 18mg,54m > 40 mg.             Education:Diabetes - Individual verbal and written instruction to review signs/symptoms of diabetes, desired ranges of glucose level fasting, after meals and with exercise. Acknowledge that pre and post exercise glucose checks will be done for 3 sessions at entry of program.   Know Your Numbers and Heart Failure: - Group verbal and visual instruction to discuss disease risk factors for  cardiac and pulmonary disease and treatment options.  Reviews associated critical values for Overweight/Obesity, Hypertension, Cholesterol, and Diabetes.   Discusses basics of heart failure: signs/symptoms and treatments.  Introduces Heart Failure Zone chart for action plan for heart failure.  Written material given at graduation. Flowsheet Row Cardiac Rehab from 01/17/2021 in [facility] Cardiac and Pulmonary Rehab  Date 01/17/21  Educator KB  Instruction Review Code 1- Verbalizes Understanding       Core Components/Risk Factors/Patient Goals Review:    Core Components/Risk Factors/Patient Goals at Discharge (Final Review):    ITP Comments:  ITP Comments     Row Name 05/08/21 0948 05/23/21 1442 05/25/21 0946 06/13/21 0914     ITP Comments Virtual orientation call completed today. he has an appointment on Date: 05/23/2021  for EP eval and gym Orientation.  Documentation of diagnosis can be found in [facility]  Date: 03/13/2021 .   Willie Clark is newly enrolled in the Pulmonary Rehab program. Completed 6MWT and gym orientation. Initial ITP created and sent for review to Dr. Zetta Bills, Medical Director. First full day of exercise!  Patient was oriented to gym and equipment including functions, settings, policies, and procedures.  Patient's individual exercise prescription and treatment plan were reviewed.  All starting workloads were established based on the results of the 6 minute walk test done at initial orientation visit.  The plan for exercise progression was also introduced and progression will be customized based on patient's performance and goals. 30 Day review completed. Medical Director ITP review done, changes made as directed, and signed approval by Medical Director.   new to program             Comments:

## 2021-06-15 ENCOUNTER — Encounter: Payer: Medicare HMO | Admitting: *Deleted

## 2021-06-15 ENCOUNTER — Other Ambulatory Visit: Payer: Self-pay

## 2021-06-15 DIAGNOSIS — Z955 Presence of coronary angioplasty implant and graft: Secondary | ICD-10-CM

## 2021-06-15 DIAGNOSIS — I5022 Chronic systolic (congestive) heart failure: Secondary | ICD-10-CM

## 2021-06-15 NOTE — Progress Notes (Signed)
Daily Session Note  Patient Details  Name: Willie Clark MRN: [id number] Date of Birth: 1950/09/16 Referring Provider:   Flowsheet Row Pulmonary Rehab from 05/23/2021 in [facility] Cardiac and Pulmonary Rehab  Referring Provider [name] MD       Encounter Date: 06/15/2021  Check In:  Session Check In - 06/15/21 0932       Check-In   Supervising physician immediately available to respond to emergencies See telemetry face sheet for immediately available ER MD    Location [facility]-Cardiac & Pulmonary Rehab    Staff Present Renita Papa, RN BSN;Joseph Tessie Fass, RCP,RRT,BSRT;Melissa Spencer, [place], LDN    Virtual Visit No    Medication changes reported     No    Fall or balance concerns reported    No    Warm-up and Cool-down Performed on first and last piece of equipment    Resistance Training Performed Yes    VAD Patient? No    PAD/SET Patient? No      Pain Assessment   Currently in Pain? No/denies                Social History   Tobacco Use  Smoking Status Former   Packs/day: 2.50   Types: Cigarettes   Quit date: 08/21/1991   Years since quitting: 29.8  Smokeless Tobacco Never    Goals Met:  Independence with exercise equipment Exercise tolerated well No report of concerns or symptoms today Strength training completed today  Goals Unmet:  Not Applicable  Comments: Pt able to follow exercise prescription today without complaint.  Will continue to monitor for progression.    Dr. Emily Filbert is Medical Director for [place].  Dr. Ottie Glazier is Medical Director for [facility] Pulmonary Rehabilitation.

## 2021-06-18 ENCOUNTER — Encounter: Payer: Medicare HMO | Admitting: *Deleted

## 2021-06-18 ENCOUNTER — Other Ambulatory Visit: Payer: Self-pay

## 2021-06-18 DIAGNOSIS — I5022 Chronic systolic (congestive) heart failure: Secondary | ICD-10-CM | POA: Diagnosis not present

## 2021-06-18 NOTE — Progress Notes (Signed)
Daily Session Note  Patient Details  Name: Willie Clark MRN: [id number] Date of Birth: 1950/08/25 Referring Provider:   Flowsheet Row Pulmonary Rehab from 05/23/2021 in [facility] Cardiac and Pulmonary Rehab  Referring Provider [name] MD       Encounter Date: 06/18/2021  Check In:  Session Check In - 06/18/21 0941       Check-In   Supervising physician immediately available to respond to emergencies See telemetry face sheet for immediately available ER MD    Location [facility]-Cardiac & Pulmonary Rehab    Staff Present Heath Lark, RN, BSN, CCRP;Joseph [place], RCP,RRT,BSRT;Kelly [place], [place], ACSM CEP, Exercise Physiologist    Virtual Visit No    Medication changes reported     No    Fall or balance concerns reported    No    Warm-up and Cool-down Performed on first and last piece of equipment    Resistance Training Performed Yes    VAD Patient? No    PAD/SET Patient? No      Pain Assessment   Currently in Pain? No/denies                Social History   Tobacco Use  Smoking Status Former   Packs/day: 2.50   Types: Cigarettes   Quit date: 08/21/1991   Years since quitting: 29.8  Smokeless Tobacco Never    Goals Met:  Proper associated with RPD/PD & O2 Sat Independence with exercise equipment Exercise tolerated well No report of concerns or symptoms today  Goals Unmet:  Not Applicable  Comments: Pt able to follow exercise prescription today without complaint.  Will continue to monitor for progression.    Dr. Emily Filbert is Medical Director for [place].  Dr. Ottie Glazier is Medical Director for [facility] Pulmonary Rehabilitation.

## 2021-06-20 ENCOUNTER — Other Ambulatory Visit: Payer: Self-pay | Admitting: Cardiovascular Disease

## 2021-06-22 ENCOUNTER — Encounter: Payer: Medicare HMO | Attending: Internal Medicine | Admitting: *Deleted

## 2021-06-22 ENCOUNTER — Telehealth: Payer: Self-pay | Admitting: Cardiovascular Disease

## 2021-06-22 ENCOUNTER — Other Ambulatory Visit: Payer: Self-pay

## 2021-06-22 DIAGNOSIS — I5022 Chronic systolic (congestive) heart failure: Secondary | ICD-10-CM | POA: Diagnosis not present

## 2021-06-22 NOTE — Telephone Encounter (Signed)
? ?  Pre-operative Risk Assessment  ?  ?Patient Name: Willie Clark  ?DOB: Nov 06, 1950 ?MRN: [id number]  ? ?  ? ?Request for Surgical Clearance   ? ?Procedure:   Colonoscopy  ? ?Date of Surgery:  Clearance 07/30/21                              ?   ?Surgeon:  Dr Haig Prophet ?Surgeon's Group or Practice Name:  [facility]  ?Phone number:  [phone] ?Fax number:  [phone] ?  ?Type of Clearance Requested:   ?- Pharmacy:  Hold Clopidogrel (Plavix) instructions ?  ?Type of Anesthesia:  Not Indicated ?  ?Additional requests/questions:   ? ?Signed, ?Caryl Pina Gerringer   ?06/22/2021, 4:12 PM  ? ?

## 2021-06-22 NOTE — Progress Notes (Signed)
Daily Session Note ? ?Patient Details  ?Name: Willie Clark ?MRN: [id number] ?Date of Birth: [birth year] ?Referring Provider:   ?Flowsheet Row Pulmonary Rehab from 05/23/2021 in [facility] Cardiac and Pulmonary Rehab  ?Referring Provider [name] MD  ? ?  ? ? ?Encounter Date: 06/22/2021 ? ?Check In: ? Session Check In - 06/22/21 0912   ? ?  ? Check-In  ? Supervising physician immediately available to respond to emergencies See telemetry face sheet for immediately available ER MD   ? Location [facility]-Cardiac & Pulmonary Rehab   ? Staff Present Renita Papa, RN BSN;Joseph Ruth, RCP,RRT,BSRT;Jessica [place], [place], [place], [place], CCET   ? Virtual Visit No   ? Medication changes reported     No   ? Fall or balance concerns reported    No   ? Warm-up and Cool-down Performed on first and last piece of equipment   ? Resistance Training Performed Yes   ? VAD Patient? No   ? PAD/SET Patient? No   ?  ? Pain Assessment  ? Currently in Pain? No/denies   ? ?  ?  ? ?  ? ? ? ? ? ?Social History  ? ?Tobacco Use  ?Smoking Status Former  ? Packs/day: 2.50  ? Types: Cigarettes  ? Quit date: 08/21/1991  ? Years since quitting: 29.8  ?Smokeless Tobacco Never  ? ? ?Goals Met:  ?Independence with exercise equipment ?Exercise tolerated well ?No report of concerns or symptoms today ?Strength training completed today ? ?Goals Unmet:  ?Not Applicable ? ?Comments: Pt able to follow exercise prescription today without complaint.  Will continue to monitor for progression. ? ? ? ?Dr. Emily Filbert is Medical Director for [place].  ?Dr. Ottie Glazier is Medical Director for [facility] Pulmonary Rehabilitation. ?

## 2021-06-25 ENCOUNTER — Other Ambulatory Visit: Payer: Self-pay

## 2021-06-25 ENCOUNTER — Encounter: Payer: Medicare HMO | Admitting: *Deleted

## 2021-06-25 DIAGNOSIS — I5022 Chronic systolic (congestive) heart failure: Secondary | ICD-10-CM

## 2021-06-25 NOTE — Telephone Encounter (Signed)
See message from Dr. Fletcher Anon regarding DAPT. ? ?Patient has upcoming appointment with Christell Faith, PA on 07/12/21, will forward to him for clearance.  ?

## 2021-06-25 NOTE — Telephone Encounter (Signed)
Breanna D. Petraglia 71 year old male is requesting preoperative cardiac evaluation for colonoscopy.  He was last seen in the clinic on 03/13/2021.  During that time he remained stable from a cardiac standpoint.  He was noted to have stable exertional dyspnea.  His wife had recently contracted COVID.  Follow-up was planned for 6 months. ? ?His PMH includes coronary artery disease, chronic systolic CHF EF 45-99%, HTN, HLD, carotid artery disease, and abdominal aortic aneurysm. ? ?May his Plavix be held prior to his procedure? ? ?Thank you for your help.  Please direct your response to [facility] DIV preop pool. ? ?Jossie Ng. Keena Heesch NP-C ? ?  ?06/25/2021, 11:12 AM ?[place] ?[address] ?Office [phone] Fax [phone] ? ?

## 2021-06-25 NOTE — Telephone Encounter (Signed)
I discussed with gastroenterology.  They are going to do the procedure while the patient is on dual antiplatelet therapy.  Given multiple stents in vein grafts, DAPT will be continued without interruption for at least 1 year since most recent PCI. ?

## 2021-06-25 NOTE — Progress Notes (Signed)
Daily Session Note ? ?Patient Details  ?Name: Willie Clark ?MRN: [id number] ?Date of Birth: 1950/08/22 ?Referring Provider:   ?Flowsheet Row Pulmonary Rehab from 05/23/2021 in [facility] Cardiac and Pulmonary Rehab  ?Referring Provider [name] MD  ? ?  ? ? ?Encounter Date: 06/25/2021 ? ?Check In: ? Session Check In - 06/25/21 0928   ? ?  ? Check-In  ? Supervising physician immediately available to respond to emergencies See telemetry face sheet for immediately available ER MD   ? Location [facility]-Cardiac & Pulmonary Rehab   ? Staff Present Heath Lark, RN, BSN, CCRP;Joseph Smarr, RCP,RRT,BSRT;Kelly [place], [place], ACSM CEP, Exercise Physiologist   ? Virtual Visit No   ? Medication changes reported     No   ? Fall or balance concerns reported    No   ? Warm-up and Cool-down Performed on first and last piece of equipment   ? Resistance Training Performed Yes   ? VAD Patient? No   ? PAD/SET Patient? No   ? ?  ?  ? ?  ? ? ? ? ? ?Social History  ? ?Tobacco Use  ?Smoking Status Former  ? Packs/day: 2.50  ? Types: Cigarettes  ? Quit date: 08/21/1991  ? Years since quitting: 29.8  ?Smokeless Tobacco Never  ? ? ?Goals Met:  ?Proper associated with RPD/PD & O2 Sat ?Independence with exercise equipment ?Exercise tolerated well ?No report of concerns or symptoms today ? ?Goals Unmet:  ?Not Applicable ? ?Comments: Pt able to follow exercise prescription today without complaint.  Will continue to monitor for progression. ? ? ? ?Dr. Emily Filbert is Medical Director for [place].  ?Dr. Ottie Glazier is Medical Director for [facility] Pulmonary Rehabilitation. ?

## 2021-06-29 ENCOUNTER — Encounter: Payer: Medicare HMO | Admitting: *Deleted

## 2021-06-29 ENCOUNTER — Other Ambulatory Visit: Payer: Self-pay

## 2021-06-29 DIAGNOSIS — I5022 Chronic systolic (congestive) heart failure: Secondary | ICD-10-CM

## 2021-06-29 NOTE — Progress Notes (Signed)
Daily Session Note ? ?Patient Details  ?Name: Willie Clark ?MRN: [id number] ?Date of Birth: 02-05-1951 ?Referring Provider:   ?Flowsheet Row Pulmonary Rehab from 05/23/2021 in [facility] Cardiac and Pulmonary Rehab  ?Referring Provider [name] MD  ? ?  ? ? ?Encounter Date: 06/29/2021 ? ?Check In: ? Session Check In - 06/29/21 0915   ? ?  ? Check-In  ? Supervising physician immediately available to respond to emergencies See telemetry face sheet for immediately available ER MD   ? Location [facility]-Cardiac & Pulmonary Rehab   ? Staff Present Renita Papa, RN BSN;Joseph [place], RCP,RRT,BSRT;Jessica [place], [place], [place], [place], CCET   ? Virtual Visit No   ? Medication changes reported     No   ? Fall or balance concerns reported    No   ? Warm-up and Cool-down Performed on first and last piece of equipment   ? Resistance Training Performed Yes   ? VAD Patient? No   ? PAD/SET Patient? No   ?  ? Pain Assessment  ? Currently in Pain? No/denies   ? ?  ?  ? ?  ? ? ? ? ? ?Social History  ? ?Tobacco Use  ?Smoking Status Former  ? Packs/day: 2.50  ? Types: Cigarettes  ? Quit date: 08/21/1991  ? Years since quitting: 29.8  ?Smokeless Tobacco Never  ? ? ?Goals Met:  ?Independence with exercise equipment ?Exercise tolerated well ?No report of concerns or symptoms today ?Strength training completed today ? ?Goals Unmet:  ?Not Applicable ? ?Comments: Pt able to follow exercise prescription today without complaint.  Will continue to monitor for progression. ? ? ? ?Dr. Emily Filbert is Medical Director for [name].  ?Dr. Ottie Glazier is Medical Director for [facility] Pulmonary Rehabilitation. ?

## 2021-07-02 ENCOUNTER — Other Ambulatory Visit: Payer: Self-pay | Admitting: Cardiovascular Disease

## 2021-07-02 ENCOUNTER — Encounter: Payer: Medicare HMO | Admitting: *Deleted

## 2021-07-02 ENCOUNTER — Other Ambulatory Visit: Payer: Self-pay

## 2021-07-02 DIAGNOSIS — I5022 Chronic systolic (congestive) heart failure: Secondary | ICD-10-CM

## 2021-07-02 NOTE — Progress Notes (Signed)
Daily Session Note ? ?Patient Details  ?Name: SHAUNE WESTFALL ?MRN: [id number] ?Date of Birth: [birth year] ?Referring Provider:   ?Flowsheet Row Pulmonary Rehab from 05/23/2021 in [facility] Cardiac and Pulmonary Rehab  ?Referring Provider [name] MD  ? ?  ? ? ?Encounter Date: 07/02/2021 ? ?Check In: ? Session Check In - 07/02/21 0937   ? ?  ? Check-In  ? Supervising physician immediately available to respond to emergencies See telemetry face sheet for immediately available ER MD   ? Location [facility]-Cardiac & Pulmonary Rehab   ? Staff Present Heath Lark, RN, BSN, [place], BS, ACSM CEP, Exercise Physiologist;Amanda Sommer, [place], ACSM CEP, Exercise Physiologist   ? Virtual Visit No   ? Medication changes reported     No   ? Fall or balance concerns reported    No   ? Warm-up and Cool-down Performed on first and last piece of equipment   ? Resistance Training Performed Yes   ? VAD Patient? No   ? PAD/SET Patient? No   ?  ? Pain Assessment  ? Currently in Pain? No/denies   ? ?  ?  ? ?  ? ? ? ? ? ?Social History  ? ?Tobacco Use  ?Smoking Status Former  ? Packs/day: 2.50  ? Types: Cigarettes  ? Quit date: 08/21/1991  ? Years since quitting: 29.8  ?Smokeless Tobacco Never  ? ? ?Goals Met:  ?Proper associated with RPD/PD & O2 Sat ?Independence with exercise equipment ?Exercise tolerated well ?No report of concerns or symptoms today ? ?Goals Unmet:  ?Not Applicable ? ?Comments: Pt able to follow exercise prescription today without complaint.  Will continue to monitor for progression. ? ? ? ?Dr. Emily Filbert is Medical Director for [place].  ?Dr. Ottie Glazier is Medical Director for [facility] Pulmonary Rehabilitation. ?

## 2021-07-04 ENCOUNTER — Other Ambulatory Visit: Payer: Self-pay

## 2021-07-04 DIAGNOSIS — I5022 Chronic systolic (congestive) heart failure: Secondary | ICD-10-CM | POA: Diagnosis not present

## 2021-07-04 NOTE — Progress Notes (Signed)
Daily Session Note ? ?Patient Details  ?Name: SUTTER AHLGREN ?MRN: [id number] ?Date of Birth: Dec 03, 1950 ?Referring Provider:   ?Flowsheet Row Pulmonary Rehab from 05/23/2021 in [facility] Cardiac and Pulmonary Rehab  ?Referring Provider [name] MD  ? ?  ? ? ?Encounter Date: 07/04/2021 ? ?Check In: ? Session Check In - 07/04/21 0915   ? ?  ? Check-In  ? Supervising physician immediately available to respond to emergencies See telemetry face sheet for immediately available ER MD   ? Location [facility]-Cardiac & Pulmonary Rehab   ? Staff Present Birdie Sons, MPA, RN;Joseph [place], RCP,RRT,BSRT;Jessica [place], MA, RCEP, CCRP, CCET   ? Virtual Visit No   ? Medication changes reported     No   ? Fall or balance concerns reported    No   ? Tobacco Cessation No Change   ? Warm-up and Cool-down Performed on first and last piece of equipment   ? Resistance Training Performed Yes   ? VAD Patient? No   ? PAD/SET Patient? No   ?  ? Pain Assessment  ? Currently in Pain? No/denies   ? ?  ?  ? ?  ? ? ? ? ? ?Social History  ? ?Tobacco Use  ?Smoking Status Former  ? Packs/day: 2.50  ? Types: Cigarettes  ? Quit date: 08/21/1991  ? Years since quitting: 29.8  ?Smokeless Tobacco Never  ? ? ?Goals Met:  ?Independence with exercise equipment ?Exercise tolerated well ?No report of concerns or symptoms today ?Strength training completed today ? ?Goals Unmet:  ?Not Applicable ? ?Comments: Pt able to follow exercise prescription today without complaint.  Will continue to monitor for progression. ? ? ? ?Dr. Emily Filbert is Medical Director for [place].  ?Dr. Ottie Glazier is Medical Director for [facility] Pulmonary Rehabilitation. ?

## 2021-07-06 ENCOUNTER — Other Ambulatory Visit: Payer: Self-pay

## 2021-07-06 ENCOUNTER — Encounter: Payer: Medicare HMO | Admitting: *Deleted

## 2021-07-06 DIAGNOSIS — I5022 Chronic systolic (congestive) heart failure: Secondary | ICD-10-CM

## 2021-07-06 NOTE — Progress Notes (Signed)
Daily Session Note ? ?Patient Details  ?Name: Willie Clark ?MRN: [id number] ?Date of Birth: [birth year] ?Referring Provider:   ?Flowsheet Row Pulmonary Rehab from 05/23/2021 in [facility] Cardiac and Pulmonary Rehab  ?Referring Provider [name] MD  ? ?  ? ? ?Encounter Date: 07/06/2021 ? ?Check In: ? Session Check In - 07/06/21 0919   ? ?  ? Check-In  ? Supervising physician immediately available to respond to emergencies See telemetry face sheet for immediately available ER MD   ? Location [facility]-Cardiac & Pulmonary Rehab   ? Staff Present Renita Papa, RN BSN;Joseph [place], RCP,RRT,BSRT;Jessica [place], [place], [place], [place], CCET   ? Virtual Visit No   ? Medication changes reported     No   ? Fall or balance concerns reported    No   ? Warm-up and Cool-down Performed on first and last piece of equipment   ? Resistance Training Performed Yes   ? VAD Patient? No   ? PAD/SET Patient? No   ?  ? Pain Assessment  ? Currently in Pain? No/denies   ? ?  ?  ? ?  ? ? ? ? ? ?Social History  ? ?Tobacco Use  ?Smoking Status Former  ? Packs/day: 2.50  ? Types: Cigarettes  ? Quit date: 08/21/1991  ? Years since quitting: 29.8  ?Smokeless Tobacco Never  ? ? ?Goals Met:  ?Independence with exercise equipment ?Exercise tolerated well ?No report of concerns or symptoms today ?Strength training completed today ? ?Goals Unmet:  ?Not Applicable ? ?Comments: Pt able to follow exercise prescription today without complaint.  Will continue to monitor for progression. ? ? ? ?Dr. Emily Filbert is Medical Director for [place].  ?Dr. Ottie Glazier is Medical Director for [facility] Pulmonary Rehabilitation. ?

## 2021-07-09 ENCOUNTER — Encounter: Payer: Medicare HMO | Admitting: *Deleted

## 2021-07-09 ENCOUNTER — Other Ambulatory Visit: Payer: Self-pay

## 2021-07-09 DIAGNOSIS — I5022 Chronic systolic (congestive) heart failure: Secondary | ICD-10-CM | POA: Diagnosis not present

## 2021-07-09 NOTE — Progress Notes (Signed)
Daily Session Note ? ?Patient Details  ?Name: Willie Clark ?MRN: [id number] ?Date of Birth: 1950/05/03 ?Referring Provider:   ?Flowsheet Row Pulmonary Rehab from 05/23/2021 in [facility] Cardiac and Pulmonary Rehab  ?Referring Provider [name] MD  ? ?  ? ? ?Encounter Date: 07/09/2021 ? ?Check In: ? Session Check In - 07/09/21 1127   ? ?  ? Check-In  ? Supervising physician immediately available to respond to emergencies See telemetry face sheet for immediately available ER MD   ? Location [facility]-Cardiac & Pulmonary Rehab   ? Staff Present Heath Lark, RN, BSN, [employer], BS, ACSM CEP, Exercise Physiologist;Jessica [place], MA, RCEP, CCRP, CCET   ? Virtual Visit No   ? Medication changes reported     No   ? Fall or balance concerns reported    No   ? Warm-up and Cool-down Performed on first and last piece of equipment   ? Resistance Training Performed Yes   ? VAD Patient? No   ? PAD/SET Patient? No   ?  ? Pain Assessment  ? Currently in Pain? No/denies   ? ?  ?  ? ?  ? ? ? ? ? ?Social History  ? ?Tobacco Use  ?Smoking Status Former  ? Packs/day: 2.50  ? Types: Cigarettes  ? Quit date: 08/21/1991  ? Years since quitting: 29.9  ?Smokeless Tobacco Never  ? ? ?Goals Met:  ?Independence with exercise equipment ?Exercise tolerated well ?No report of concerns or symptoms today ?Strength training completed today ? ?Goals Unmet:  ?Not Applicable ? ?Comments: Pt able to follow exercise prescription today without complaint.  Will continue to monitor for progression. ? ? ? ?Dr. Emily Filbert is Medical Director for [place].  ?Dr. Ottie Glazier is Medical Director for [facility] Pulmonary Rehabilitation. ?

## 2021-07-09 NOTE — Progress Notes (Signed)
? ?Cardiology Office Note   ? ?Date:  07/12/2021  ? ?ID:  Willie Clark, DOB Jun 08, 1950, MRN [id number] ? ?PCP:  [facility] [place]  ?Cardiologist:  [name], MD  ?Electrophysiologist:  None  ? ?Chief Complaint: Hospital follow-up ? ?History of Present Illness:  ? ?Willie Clark is a 71 y.o. male with history of CAD status post CABG in 1993 with redo in 2007 status post subsequent PCI's, HFrEF secondary to ICM, fibromyalgia, chronic pain, gastric ulcer, HTN, HLD, OSA on CPAP, postoperative PE status post therapy, CVA, SBO, Barrett's esophagus, recurrent diverticulitis, hemorrhoids, nephrolithiasis, orthostatic hypotension, and cervical spine pain status post prior surgeries who presents for hospital follow-up as outlined below. ? ?Willie Clark was admitted to the hospital 11/2019 with angina.  Echo showed an EF of 40 to 45%.  LHC showed severe three-vessel CAD with patent grafts and no significant change with comparison to cath in 2017.  Willie Clark was medically managed and GDMT was optimized.  However, Willie Clark had an allergic reaction to [facility] with rash and pruritus that required treatment in the ED.  Willie Clark was seen in the office in 06/2020 with unstable angina.  EKG was worrisome for inferior ST segment elevation MI.  Willie Clark underwent emergent LHC that showed severe native three-vessel CAD with patent LCx stent with mild to moderate ISR, widely patent LIMA to LAD, patent SVG to OM 2 with 60 to 70% proximal graft stenosis and subtotally occluded SVG to RCA with a long segment of proximal disease and a 99% stenosis of the distal anastomosis.  Willie Clark underwent successful PCI to the SVG to RCA with overlapping DES.  Echo during that admission showed an EF of 35 to 40%.  Willie Clark was admitted in 10/2020 with increased shortness of breath and chest pain.  R/LHC showed severe three-vessel CAD including CTO of the ostial LAD, OM1, and proximal RCA.  Patent LCx stent with moderate in-stent restenosis estimated at approximately 50%  and similar to prior cardiac caths.  Widely patent LIMA to LAD.  Patent SVG to rPDA with patent proximal/mid graft stents that demonstrated mild in-stent restenosis estimated at 10%.  Distal anastomotic stent was widely patent.  There was a significant ectasia in the midportion of the graft.  Patent SVG to OM with sequential 75% and 60% ostial and mid graft stenoses.  Upper normal to mildly elevated left and right heart filling pressures.  Normal pulmonary pressure.  Mildly reduced cardiac output/index.  Willie Clark underwent successful PCI/DES to the SVG to OM with 2 nonoverlapping stents.  Echo during that admission demonstrated an EF of 40 to 45%, mild global hypokinesis with moderate anteroseptal wall hypokinesis and inferior wall hypokinesis, normal RV systolic function, ventricular cavity size, and RVSP, mildly dilated left atrium, and mild mitral regurgitation.  Following the above intervention, Willie Clark continued to have shortness of breath leading his Brilinta to be transitioned to clopidogrel, with brief improvement in dyspnea though his shortness of breath returned.   ? ?Willie Clark was admitted to [facility] in 05/2021 with sepsis due to acute diverticulitis, group B Streptococcus bacteremia, and demand ischemia.  Willie Clark was noted to have a mildly elevated high-sensitivity troponin peaking at 139.  Willie Clark was treated with 48 hours of heparin.  BNP normal at 90.  CTA chest was negative for PE or aortic dissection.  CT head showed no acute intracranial abnormality.  Echo during that admission demonstrated an EF of 45 to 50%, global hypokinesis, mildly dilated LV internal cavity size, mild LVH of  the lateral segment, grade 1 diastolic dysfunction, normal RV systolic function with moderately enlarged ventricular cavity size, mild mitral regurgitation, and trivial aortic insufficiency. ? ?Since his hospital discharge, we have received preprocedure cardiac evaluation request for upcoming colonoscopy.  The patient's primary cardiologist has  discussed this with GI with the recommendation for procedure to occur while the patient is on dual antiplatelet therapy.  Willie Clark has also been evaluated by general surgery with plans for possible left hemicolectomy.  At that visit, the patient was informed Willie Clark is high risk for major abdominal surgery, though the patient is specifically stated to the surgeon, and to me at our visit today that Willie Clark is "willing to die" to try and improve his pain. ? ?Willie Clark comes in today continuing to note abdominal pain, generalized fatigue, dyspnea, and chest discomfort.  Willie Clark has been working out through heart track and notes chest discomfort and dyspnea that becomes present when walking on the treadmill.  However, if Willie Clark is sitting on a recumbent bike and pedaling Willie Clark notes improvement in these symptoms.  His weight remains stable.  No significant lower extremity swelling or abdominal distention.  No orthopnea.  No dizziness, presyncope, or syncope.  No falls since Willie Clark was last seen.  No hematochezia or melena. ? ? ? ?Labs independently reviewed: ?05/2021 - potassium 3.2, BUN 10, serum creatinine 0.92, Hgb 12.0, PLT 147, magnesium 1.8, albumin 4.1, AST/ALT normal ?10/2020 - TSH normal, A1c 6.1 ?01/2020 - TC 165, TG 317, HDL 41, LDL 61 ? ?Past Medical History:  ?Diagnosis Date  ? Abnormal stress test   ? Anxiety   ? Arthritis   ? Barrett's esophagus   ? Bowel obstruction ([place]) 02/2009  ? small bowel  ? CHF (congestive heart failure) ([place])   ? Chronic chest pain   ? Coronary artery disease   ? a. 1993 CABG; b. 2007 Redo CABG; c. 09/2015 Cath: LM mild dzs, LAD 100ost, RI mild dzs, LCX 60p, OM1 100, RCA 100ost, VG->OM2 min irregs, LIMA->LAD nl, VG->RPDA 30ost, 20d, nl EF->Med rx.  ? Depression   ? Diverticulitis   ? Diverticulosis   ? Erectile dysfunction   ? Fibromyalgia   ? Gastric ulcer   ? Gastritis   ? GERD (gastroesophageal reflux disease)   ? Headache   ? Hemorrhoids   ? HFrEF (heart failure with reduced ejection fraction) ([place])   ? a. 09/2016  Echo: EF 55-60%, no rwma, triv AI, mild MR, mildly dil LA. -> as of April 2020: EF 40 to 45%.  Septal dyssynergy/hypokinesis due to postop state but otherwise unable to assess wall motion [name] due to poor study.  ? History of myocardial infarct at age less than 61 years   ? Hyperlipidemia   ? Hypertension   ? Kidney stones   ? going to [facility]  June 26 to see kidney specialist  ? Lightheadedness   ? Mass on back   ? Nephrolithiasis   ? Orthostatic hypotension   ? a. Improved after discontinuation of metoprolol.  ? OSA (obstructive sleep apnea)   ? a. On CPAP.  ? Pulmonary embolism ([place]) 01/2006  ? Post-op/treated  ? Skin lesions, generalized   ? Stroke [facility]) 1993  ? right side weakness no blood thinners   on Aspirin  ? Syncope and collapse   ? TIA (transient ischemic attack) 08/13/2014  ? Urticaria   ? ? ?Past Surgical History:  ?Procedure Laterality Date  ? BACK SURGERY    ? bowel obstruction  01/2009  ? CARDIAC CATHETERIZATION  11/2006  ? patent grafts. Significant OM3 disease and occluded diagonals.  ? CARDIAC CATHETERIZATION  06/2010  ? patent grafts. significant ISR in proximal LCX but OM2 is bypassed and gives retrograde flow to OM3.   ? CARDIAC CATHETERIZATION  06/2013  ? [facility]: Patent grafts with 60% proximal in-stent restenosis in the left circumflex with FFR of 0.85  ? CARDIAC CATHETERIZATION Left 09/25/2015  ? Procedure: Left Heart Cath and Cors/Grafts Angiography;  Surgeon: Wellington Hampshire, MD;  Location: [place] CV LAB;  Service: Cardiovascular;  Laterality: Left;  ? CERVICAL FUSION    ? CHOLECYSTECTOMY    ? COLON SURGERY  01/2009  ? BOWEL RESECTION DUE TO SMALL BOWEL OBSTRUCTION  ? COLONOSCOPY    ? COLONOSCOPY WITH PROPOFOL N/A 10/28/2017  ? Procedure: COLONOSCOPY WITH PROPOFOL;  Surgeon: Lollie Sails, MD;  Location: [facility] ENDOSCOPY;  Service: Endoscopy;  Laterality: N/A;  ? CORONARY ANGIOPLASTY  2006  ? CORONARY ARTERY BYPASS GRAFT  1993/01/2006  ? redo at [facility]. LIMA to LAD, SVG to OM2 and  SVG to RPDA  ? CORONARY STENT INTERVENTION N/A 06/20/2020  ? Procedure: CORONARY STENT INTERVENTION;  Surgeon: Nelva Bush, MD;  Location: [place] CV LAB;  Service: Cardiovascular;  Laterality: N/A;  ?

## 2021-07-11 ENCOUNTER — Other Ambulatory Visit: Payer: Self-pay

## 2021-07-11 ENCOUNTER — Encounter: Payer: Self-pay | Admitting: *Deleted

## 2021-07-11 DIAGNOSIS — I5022 Chronic systolic (congestive) heart failure: Secondary | ICD-10-CM

## 2021-07-11 NOTE — Progress Notes (Signed)
Pulmonary Individual Treatment Plan ? ?Patient Details  ?Name: Willie Clark ?MRN: [id number] ?Date of Birth: [birth year] ?Referring Provider:   ?Flowsheet Row Pulmonary Rehab from 05/23/2021 in [facility] Cardiac and Pulmonary Rehab  ?Referring Provider [name] MD  ? ?  ? ? ?Initial Encounter Date:  ?Flowsheet Row Pulmonary Rehab from 05/23/2021 in [facility] Cardiac and Pulmonary Rehab  ?Date 05/23/21  ? ?  ? ? ?Visit Diagnosis: Heart failure, chronic systolic (HCC) ? ?Patient's Home Medications on Admission: ? ?Current Outpatient Medications:  ?  albuterol (PROVENTIL HFA;VENTOLIN HFA) 108 (90 Base) MCG/ACT inhaler, Inhale into the lungs every 4 (four) hours as needed for wheezing or shortness of breath., Disp: , Rfl:  ?  aspirin EC 81 MG EC tablet, Take 1 tablet (81 mg total) by mouth daily. Swallow whole., Disp: 30 tablet, Rfl: 11 ?  carvedilol (COREG) 3.125 MG tablet, TAKE 1 TABLET (3.125 MG TOTAL) BY MOUTH 2 (TWO) TIMES DAILY WITH A MEAL., Disp: 180 tablet, Rfl: 0 ?  citalopram (CELEXA) 20 MG tablet, Take 20 mg by mouth daily. , Disp: , Rfl:  ?  clopidogrel (PLAVIX) 75 MG tablet, TAKE (1) TABLET 75 MG DAILY., Disp: 90 tablet, Rfl: 3 ?  dicyclomine (BENTYL) 10 MG capsule, TAKE 1 CAPSULE THREE TIMES DAILY AS NEEDED, Disp: , Rfl:  ?  docusate calcium (SURFAK) 240 MG capsule, Take 240 mg by mouth daily., Disp: , Rfl:  ?  empagliflozin (JARDIANCE) 10 MG TABS tablet, Take 1 tablet (10 mg total) by mouth daily before breakfast., Disp: 90 tablet, Rfl: 3 ?  EPINEPHrine 0.3 mg/0.3 mL IJ SOAJ injection, Inject 0.3 mg into the muscle as needed. , Disp: , Rfl:  ?  fexofenadine (ALLEGRA) 180 MG tablet, Take 180 mg by mouth daily., Disp: , Rfl:  ?  finasteride (PROSCAR) 5 MG tablet, Take 5 mg by mouth daily., Disp: , Rfl:  ?  furosemide (LASIX) 20 MG tablet, Take 20 mg by mouth daily., Disp: , Rfl:  ?  gabapentin (NEURONTIN) 800 MG tablet, Take 1 tablet by mouth 3 (three) times daily., Disp: , Rfl:  ?  isosorbide mononitrate (IMDUR) 60  MG 24 hr tablet, Take 0.5 tablets (30 mg total) by mouth daily., Disp: 90 tablet, Rfl: 3 ?  KLOR-CON M20 20 MEQ tablet, TAKE 1 TABLET BY MOUTH EVERY DAY, Disp: 90 tablet, Rfl: 0 ?  mesalamine (APRISO) 0.375 g 24 hr capsule, Take 1.4 mg by mouth daily., Disp: , Rfl:  ?  Multiple Vitamins-Minerals (HAIR SKIN AND NAILS FORMULA PO), Take 1 capsule by mouth daily., Disp: , Rfl:  ?  NALTREXONE HCL PO, Take 2.5 mg by mouth daily., Disp: , Rfl:  ?  nitroGLYCERIN (NITROSTAT) 0.4 MG SL tablet, Place 1 tablet (0.4 mg total) under the tongue every 5 (five) minutes as needed for chest pain., Disp: 90 tablet, Rfl: 1 ?  nystatin-triamcinolone ointment (MYCOLOG), Apply 1 application topically 2 (two) times daily., Disp: 30 g, Rfl: 0 ?  ondansetron (ZOFRAN) 4 MG tablet, Take 4 mg by mouth every 8 (eight) hours as needed for nausea or vomiting., Disp: , Rfl:  ?  oxyCODONE (OXY IR/ROXICODONE) 5 MG immediate release tablet, 0.5-1 mg every 6 (six) hours as needed., Disp: , Rfl:  ?  pantoprazole (PROTONIX) 40 MG tablet, Take 40 mg by mouth 2 (two) times daily. , Disp: , Rfl:  ?  Peppermint Oil (IBGARD PO), Take by mouth daily at 2 am., Disp: , Rfl:  ?  Probiotic Product (PROBIOTIC PO),  Take by mouth daily at 8 pm., Disp: , Rfl:  ?  rOPINIRole (REQUIP) 1 MG tablet, Take 1 mg by mouth 2 (two) times daily., Disp: , Rfl:  ?  rosuvastatin (CRESTOR) 40 MG tablet, TAKE 1 TABLET EVERY DAY ( DOSE INCREASE ), Disp: 90 tablet, Rfl: 0 ?  spironolactone (ALDACTONE) 25 MG tablet, TAKE 1 TABLET EVERY DAY, Disp: 90 tablet, Rfl: 1 ?  torsemide (DEMADEX) 20 MG tablet, Take 2 tablets (40 mg total) by mouth daily., Disp: 180 tablet, Rfl: 2 ?  traMADol (ULTRAM) 50 MG tablet, Take 50 mg by mouth every 6 (six) hours as needed., Disp: , Rfl:  ?  VASCEPA 1 g capsule, TAKE 2 CAPSULES TWICE DAILY, Disp: 360 capsule, Rfl: 1 ?  vitamin B-12 (CYANOCOBALAMIN) 1000 MCG tablet, Take 1,000 mcg by mouth daily., Disp: , Rfl:  ? ?Past Medical History: ?Past Medical History:   ?Diagnosis Date  ? Abnormal stress test   ? Anxiety   ? Arthritis   ? Barrett's esophagus   ? Bowel obstruction ([place]) 02/2009  ? small bowel  ? CHF (congestive heart failure) ([place])   ? Chronic chest pain   ? Coronary artery disease   ? a. 1993 CABG; b. 2007 Redo CABG; c. 09/2015 Cath: LM mild dzs, LAD 100ost, RI mild dzs, LCX 60p, OM1 100, RCA 100ost, VG->OM2 min irregs, LIMA->LAD nl, VG->RPDA 30ost, 20d, nl EF->Med rx.  ? Depression   ? Diverticulitis   ? Diverticulosis   ? Erectile dysfunction   ? Fibromyalgia   ? Gastric ulcer   ? Gastritis   ? GERD (gastroesophageal reflux disease)   ? Headache   ? Hemorrhoids   ? HFrEF (heart failure with reduced ejection fraction) ([place])   ? a. 09/2016 Echo: EF 55-60%, no rwma, triv AI, mild MR, mildly dil LA. -> as of April 2020: EF 40 to 45%.  Septal dyssynergy/hypokinesis due to postop state but otherwise unable to assess wall motion [name] due to poor study.  ? History of myocardial infarct at age less than 73 years   ? Hyperlipidemia   ? Hypertension   ? Kidney stones   ? going to [facility]  June 26 to see kidney specialist  ? Lightheadedness   ? Mass on back   ? Nephrolithiasis   ? Orthostatic hypotension   ? a. Improved after discontinuation of metoprolol.  ? OSA (obstructive sleep apnea)   ? a. On CPAP.  ? Pulmonary embolism ([place]) 01/2006  ? Post-op/treated  ? Skin lesions, generalized   ? Stroke [facility]) 1993  ? right side weakness no blood thinners   on Aspirin  ? Syncope and collapse   ? TIA (transient ischemic attack) 08/13/2014  ? Urticaria   ? ? ?Tobacco Use: ?Social History  ? ?Tobacco Use  ?Smoking Status Former  ? Packs/day: 2.50  ? Types: Cigarettes  ? Quit date: 08/21/1991  ? Years since quitting: 29.9  ?Smokeless Tobacco Never  ? ? ?Labs: ?Review Flowsheet   ? ?  ?  Latest Ref Rng & Units 12/11/2019 12/13/2019 02/07/2020 11/14/2020  ?Labs for [facility] Cardiac and Pulmonary Rehab  ?Cholestrol 0 - 200 mg/dL  181   165     ?LDL (calc) 0 - 99 mg/dL  84   61     ?HDL-C >40  mg/dL  35   41     ?Trlycerides <150 mg/dL  312   317     ?Hemoglobin A1c 4.8 - 5.6 % 6.0  6.1    ?Bicarbonate 20.0 - 28.0 mmol/L      ?O2 Saturation %      ? ?  06/01/2021  ?Labs for [facility] Cardiac and Pulmonary Rehab  ?Cholestrol   ?LDL (calc)   ?HDL-C   ?Trlycerides   ?Hemoglobin A1c   ?Bicarbonate 32.3    ?O2 Saturation 71.3    ?  ? ? Multiple values from one day are sorted in reverse-chronological order  ?  ?  ? ? ? ?Pulmonary Assessment Scores: ? Pulmonary Assessment Scores   ? ? [place] Name 05/23/21 1450  ?  ?  ?  ? ADL UCSD  ? ADL Phase Entry    ? SOB Score total 40    ? Rest 1    ? Walk 3    ? Stairs 3    ? Bath 2    ? Dress 2    ? Shop 0    ?  ? CAT Score  ? CAT Score 13    ?  ? mMRC Score  ? mMRC Score 1    ? ?  ?  ? ?  ?  ?UCSD: ?Self-administered rating of dyspnea associated with activities of daily living (ADLs) ?6-point scale (0 = "not at all" to 5 = "maximal or unable to do because of breathlessness")  ?Scoring Scores range from 0 to 120.  Minimally important difference is 5 units ? ?CAT: ?CAT can identify the health impairment of COPD patients and is better correlated with disease progression.  ?CAT has a scoring range of zero to 40. The CAT score is classified into four groups of low (less than 10), medium (10 - 20), high (21-30) and very high (31-40) based on the impact level of disease on health status. A CAT score over 10 suggests significant symptoms.  A worsening CAT score could be explained by an exacerbation, poor medication adherence, poor inhaler technique, or progression of COPD or comorbid conditions.  ?CAT MCID is 2 points ? ?mMRC: ?mMRC (Modified Medical Research Council) Dyspnea Scale is used to assess the degree of baseline functional disability in patients of respiratory disease due to dyspnea. ?No minimal important difference is established. A decrease in score of 1 point or greater is considered a positive change.  ? ?Pulmonary Function Assessment: ? ? ?Exercise Target  Goals: ?Exercise Program Goal: ?Individual exercise prescription set using results from initial 6 min walk test and THRR while considering  patient?s activity barriers and safety.  ? ?Exercise Prescription Goal: ?

## 2021-07-11 NOTE — Progress Notes (Signed)
Daily Session Note ? ?Patient Details  ?Name: Willie Clark ?MRN: [id number] ?Date of Birth: Aug 31, 1950 ?Referring Provider:   ?Flowsheet Row Pulmonary Rehab from 05/23/2021 in [facility] Cardiac and Pulmonary Rehab  ?Referring Provider [name] MD  ? ?  ? ? ?Encounter Date: 07/11/2021 ? ?Check In: ? Session Check In - 07/11/21 0924   ? ?  ? Check-In  ? Supervising physician immediately available to respond to emergencies See telemetry face sheet for immediately available ER MD   ? Location [facility]-Cardiac & Pulmonary Rehab   ? Staff Present Birdie Sons, MPA, RN;Joseph Clinton, RCP,RRT,BSRT;Melissa Superior, RDN, LDN   ? Virtual Visit No   ? Medication changes reported     No   ? Fall or balance concerns reported    No   ? Tobacco Cessation No Change   ? Warm-up and Cool-down Performed on first and last piece of equipment   ? Resistance Training Performed Yes   ? VAD Patient? No   ? PAD/SET Patient? No   ?  ? Pain Assessment  ? Currently in Pain? No/denies   ? ?  ?  ? ?  ? ? ? ? ? ?Social History  ? ?Tobacco Use  ?Smoking Status Former  ? Packs/day: 2.50  ? Types: Cigarettes  ? Quit date: 08/21/1991  ? Years since quitting: 29.9  ?Smokeless Tobacco Never  ? ? ?Goals Met:  ?Independence with exercise equipment ?Exercise tolerated well ?No report of concerns or symptoms today ?Strength training completed today ? ?Goals Unmet:  ?Not Applicable ? ?Comments: Pt able to follow exercise prescription today without complaint.  Will continue to monitor for progression. ? ? ? ?Dr. Emily Filbert is Medical Director for [place].  ?Dr. Ottie Glazier is Medical Director for [facility] Pulmonary Rehabilitation. ?

## 2021-07-12 ENCOUNTER — Encounter: Payer: Self-pay | Admitting: Physician Assistant

## 2021-07-12 ENCOUNTER — Other Ambulatory Visit: Payer: Self-pay

## 2021-07-12 ENCOUNTER — Ambulatory Visit: Payer: Medicare HMO | Admitting: Physician Assistant

## 2021-07-12 VITALS — BP 110/70 | HR 60 | Ht 70.0 in | Wt 200.0 lb

## 2021-07-12 DIAGNOSIS — R072 Precordial pain: Secondary | ICD-10-CM

## 2021-07-12 DIAGNOSIS — Z01810 Encounter for preprocedural cardiovascular examination: Secondary | ICD-10-CM

## 2021-07-12 DIAGNOSIS — I779 Disorder of arteries and arterioles, unspecified: Secondary | ICD-10-CM

## 2021-07-12 DIAGNOSIS — I25118 Atherosclerotic heart disease of native coronary artery with other forms of angina pectoris: Secondary | ICD-10-CM | POA: Diagnosis not present

## 2021-07-12 DIAGNOSIS — I714 Abdominal aortic aneurysm, without rupture, unspecified: Secondary | ICD-10-CM

## 2021-07-12 DIAGNOSIS — I10 Essential (primary) hypertension: Secondary | ICD-10-CM

## 2021-07-12 DIAGNOSIS — I5022 Chronic systolic (congestive) heart failure: Secondary | ICD-10-CM | POA: Diagnosis not present

## 2021-07-12 DIAGNOSIS — E785 Hyperlipidemia, unspecified: Secondary | ICD-10-CM

## 2021-07-12 DIAGNOSIS — I255 Ischemic cardiomyopathy: Secondary | ICD-10-CM | POA: Diagnosis not present

## 2021-07-12 NOTE — Patient Instructions (Addendum)
Medication Instructions:  ?No changes at this time. ? ?*If you need a refill on your cardiac medications before your next appointment, please call your pharmacy* ? ? ?Lab Work: ?BMET today ? ?If you have labs (blood work) drawn today and your tests are completely normal, you will receive your results only by: ?MyChart Message (if you have MyChart) OR ?A paper copy in the mail ?If you have any lab test that is abnormal or we need to change your treatment, we will call you to review the results. ? ? ?Testing/Procedures: ?[facility] MYOVIEW ? ?Your caregiver has ordered a Stress Test with nuclear imaging. The purpose of this test is to evaluate the blood supply to your heart muscle. This procedure is referred to as a "Non-Invasive Stress Test." This is because other than having an IV started in your vein, nothing is inserted or "invades" your body. Cardiac stress tests are done to find areas of poor blood flow to the heart by determining the extent of coronary artery disease (CAD). Some patients exercise on a treadmill, which naturally increases the blood flow to your heart, while others who are  unable to walk on a treadmill due to physical limitations have a pharmacologic/chemical stress agent called Lexiscan . This medicine will mimic walking on a treadmill by temporarily increasing your coronary blood flow.  ? ?Please note: these test may take anywhere between 2-4 hours to complete ? ?PLEASE REPORT TO [facility] MEDICAL MALL ENTRANCE  ?THE VOLUNTEERS AT THE FIRST DESK WILL DIRECT YOU WHERE TO GO ? ?Date of Procedure:_______________________ ? ?Arrival Time for Procedure:___________________________ ? ?Instructions regarding medication:  ? ?__XX__ : Hold diabetes medication morning of procedure ? ?_XX___:  Hold other medications as follows: Torsemide and Furosemide ? ?PLEASE NOTIFY THE OFFICE AT LEAST 57 HOURS IN ADVANCE IF YOU ARE UNABLE TO KEEP YOUR APPOINTMENT.  [phone] ?AND  ?PLEASE NOTIFY NUCLEAR MEDICINE AT [facility] AT  LEAST 20 HOURS IN ADVANCE IF YOU ARE UNABLE TO KEEP YOUR APPOINTMENT. [phone] ? ?How to prepare for your Myoview test: ? ?Do not eat or drink after midnight ?No caffeine for 24 hours prior to test ?No smoking 24 hours prior to test. ?Your medication may be taken with water.  If your doctor stopped a medication because of this test, do not take that medication. ?Ladies, please do not wear dresses.  Skirts or pants are appropriate. Please wear a short sleeve shirt. ?No perfume, cologne or lotion. ?Wear comfortable walking shoes. No heels! ? ? ?Follow-Up: ?At [facility], you and your health needs are our priority.  As part of our continuing mission to provide you with exceptional heart care, we have created designated Provider Care Teams.  These Care Teams include your primary Cardiologist (physician) and Advanced Practice Providers (APPs -  Physician Assistants and Nurse Practitioners) who all work together to provide you with the care you need, when you need it. ? ? ?Your next appointment:   ?2 month(s) ? ?The format for your next appointment:   ?In Person ? ?Provider:   ?[name], MD or Christell Faith, PA-C ?

## 2021-07-13 ENCOUNTER — Encounter: Payer: Medicare HMO | Admitting: *Deleted

## 2021-07-13 DIAGNOSIS — I5022 Chronic systolic (congestive) heart failure: Secondary | ICD-10-CM

## 2021-07-13 LAB — BASIC METABOLIC PANEL
BUN/Creatinine Ratio: 9 — ABNORMAL LOW (ref 10–24)
BUN: 9 mg/dL (ref 8–27)
CO2: 24 mmol/L (ref 20–29)
Calcium: 9.2 mg/dL (ref 8.6–10.2)
Chloride: 99 mmol/L (ref 96–106)
Creatinine, Ser: 0.96 mg/dL (ref 0.76–1.27)
Glucose: 153 mg/dL — ABNORMAL HIGH (ref 70–99)
Potassium: 3.8 mmol/L (ref 3.5–5.2)
Sodium: 142 mmol/L (ref 134–144)
eGFR: 85 mL/min/{1.73_m2} (ref >59)

## 2021-07-13 NOTE — Progress Notes (Signed)
Daily Session Note ? ?Patient Details  ?Name: JAIMESON GOPAL ?MRN: [id number] ?Date of Birth: 03-22-1951 ?Referring Provider:   ?Flowsheet Row Pulmonary Rehab from 05/23/2021 in [facility] Cardiac and Pulmonary Rehab  ?Referring Provider [name] MD  ? ?  ? ? ?Encounter Date: 07/13/2021 ? ?Check In: ? Session Check In - 07/13/21 0918   ? ?  ? Check-In  ? Supervising physician immediately available to respond to emergencies See telemetry face sheet for immediately available ER MD   ? Location [facility]-Cardiac & Pulmonary Rehab   ? Staff Present Renita Papa, RN BSN;Joseph Gillis, RCP,RRT,BSRT;Jessica [place], [place], [place], [place], CCET   ? Virtual Visit No   ? Medication changes reported     No   ? Fall or balance concerns reported    No   ? Warm-up and Cool-down Performed on first and last piece of equipment   ? Resistance Training Performed Yes   ? VAD Patient? No   ? PAD/SET Patient? No   ?  ? Pain Assessment  ? Currently in Pain? No/denies   ? ?  ?  ? ?  ? ? ? ? ? ?Social History  ? ?Tobacco Use  ?Smoking Status Former  ? Packs/day: 2.50  ? Types: Cigarettes  ? Quit date: 08/21/1991  ? Years since quitting: 29.9  ?Smokeless Tobacco Never  ? ? ?Goals Met:  ?Independence with exercise equipment ?Exercise tolerated well ?No report of concerns or symptoms today ?Strength training completed today ? ?Goals Unmet:  ?Not Applicable ? ?Comments: Pt able to follow exercise prescription today without complaint.  Will continue to monitor for progression. ? ? ? ?Dr. Emily Filbert is Medical Director for [place].  ?Dr. Ottie Glazier is Medical Director for [facility] Pulmonary Rehabilitation. ?

## 2021-07-16 ENCOUNTER — Other Ambulatory Visit: Payer: Self-pay

## 2021-07-16 ENCOUNTER — Encounter: Payer: Medicare HMO | Admitting: *Deleted

## 2021-07-16 DIAGNOSIS — I5022 Chronic systolic (congestive) heart failure: Secondary | ICD-10-CM | POA: Diagnosis not present

## 2021-07-16 NOTE — Progress Notes (Signed)
Daily Session Note ? ?Patient Details  ?Name: Willie Clark ?MRN: [id number] ?Date of Birth: 02/22/1951 ?Referring Provider:   ?Flowsheet Row Pulmonary Rehab from 05/23/2021 in [facility] Cardiac and Pulmonary Rehab  ?Referring Provider Arida, Muhammad MD  ? ?  ? ? ?Encounter Date: 07/16/2021 ? ?Check In: ? Session Check In - 07/16/21 0941   ? ?  ? Check-In  ? Supervising physician immediately available to respond to emergencies See telemetry face sheet for immediately available ER MD   ? Location [facility]-Cardiac & Pulmonary Rehab   ? Staff Present Susanne Bice, RN, BSN, CCRP;Kelly Hayes, BS, ACSM CEP, Exercise Physiologist;Amanda Sommer, BA, ACSM CEP, Exercise Physiologist   ? Virtual Visit No   ? Medication changes reported     No   ? Fall or balance concerns reported    No   ? Warm-up and Cool-down Performed on first and last piece of equipment   ? Resistance Training Performed Yes   ? VAD Patient? No   ? PAD/SET Patient? No   ?  ? Pain Assessment  ? Currently in Pain? No/denies   ? ?  ?  ? ?  ? ? ? ? ? ?Social History  ? ?Tobacco Use  ?Smoking Status Former  ? Packs/day: 2.50  ? Types: Cigarettes  ? Quit date: 08/21/1991  ? Years since quitting: 29.9  ?Smokeless Tobacco Never  ? ? ?Goals Met:  ?Proper associated with RPD/PD & O2 Sat ?Independence with exercise equipment ?Exercise tolerated well ?No report of concerns or symptoms today ? ?Goals Unmet:  ?Not Applicable ? ?Comments: Pt able to follow exercise prescription today without complaint.  Will continue to monitor for progression. ? ? ? ?Dr. Mark Miller is Medical Director for [facility].  ?Dr. Fuad Aleskerov is Medical Director for [facility] Pulmonary Rehabilitation. ?

## 2021-07-18 ENCOUNTER — Other Ambulatory Visit: Payer: Self-pay

## 2021-07-18 DIAGNOSIS — I5022 Chronic systolic (congestive) heart failure: Secondary | ICD-10-CM

## 2021-07-18 NOTE — Progress Notes (Signed)
Daily Session Note ? ?Patient Details  ?Name: Willie Clark ?MRN: [id number] ?Date of Birth: Oct 20, 1950 ?Referring Provider:   ?Flowsheet Row Pulmonary Rehab from 05/23/2021 in [facility] Cardiac and Pulmonary Rehab  ?Referring Provider [name] MD  ? ?  ? ? ?Encounter Date: 07/18/2021 ? ?Check In: ? Session Check In - 07/18/21 0919   ? ?  ? Check-In  ? Supervising physician immediately available to respond to emergencies See telemetry face sheet for immediately available ER MD   ? Location [facility]-Cardiac & Pulmonary Rehab   ? Staff Present Birdie Sons, MPA, RN;Amanda Sommer, BA, ACSM CEP, Exercise Physiologist;Joseph [place], [place]   ? Virtual Visit No   ? Medication changes reported     No   ? Fall or balance concerns reported    No   ? Tobacco Cessation No Change   ? Warm-up and Cool-down Performed on first and last piece of equipment   ? Resistance Training Performed Yes   ? VAD Patient? No   ? PAD/SET Patient? No   ?  ? Pain Assessment  ? Currently in Pain? No/denies   ? ?  ?  ? ?  ? ? ? ? ? ?Social History  ? ?Tobacco Use  ?Smoking Status Former  ? Packs/day: 2.50  ? Types: Cigarettes  ? Quit date: 08/21/1991  ? Years since quitting: 29.9  ?Smokeless Tobacco Never  ? ? ?Goals Met:  ?Independence with exercise equipment ?Exercise tolerated well ?No report of concerns or symptoms today ?Strength training completed today ? ?Goals Unmet:  ?Not Applicable ? ?Comments: Pt able to follow exercise prescription today without complaint.  Will continue to monitor for progression. ? ? ? ?Dr. Emily Filbert is Medical Director for [place].  ?Dr. Ottie Glazier is Medical Director for [facility] Pulmonary Rehabilitation. ?

## 2021-07-19 ENCOUNTER — Encounter
Admission: RE | Admit: 2021-07-19 | Discharge: 2021-07-19 | Disposition: A | Discharge status: Home / Self Care | Payer: Medicare HMO | Source: Ambulatory Visit | Attending: Physician Assistant | Admitting: Physician Assistant

## 2021-07-19 DIAGNOSIS — R072 Precordial pain: Secondary | ICD-10-CM | POA: Insufficient documentation

## 2021-07-19 MED ORDER — REGADENOSON 0.4 MG/5ML IV SOLN
0.4000 mg | Freq: Once | INTRAVENOUS | Status: AC
  Administered 2021-07-19: 0.4 mg via INTRAVENOUS
  Filled 2021-07-19: qty 5

## 2021-07-19 MED ORDER — TECHNETIUM TC 99M TETROFOSMIN IV KIT
30.0000 | PACK | Freq: Once | INTRAVENOUS | Status: AC | PRN
  Administered 2021-07-19: 32.1 via INTRAVENOUS

## 2021-07-19 MED ORDER — TECHNETIUM TC 99M TETROFOSMIN IV KIT
10.4100 | PACK | Freq: Once | INTRAVENOUS | Status: AC | PRN
  Administered 2021-07-19: 10.41 via INTRAVENOUS

## 2021-07-20 LAB — NM MYOCAR MULTI W/SPECT W/WALL MOTION / EF
LV dias vol: 134 mL (ref 62–150)
LV sys vol: 66 mL
MPHR: 150 {beats}/min
Nuc Stress EF: 51 %
Peak HR: 85 {beats}/min
Percent HR: 56 %
Rest HR: 63 {beats}/min
Rest Nuclear Isotope Dose: 10.4 mCi
SDS: 3
SRS: 9
SSS: 7
ST Depression (mm): 0 mm
Stress Nuclear Isotope Dose: 32.1 mCi
TID: 1.1

## 2021-07-23 ENCOUNTER — Encounter: Payer: Medicare HMO | Attending: Internal Medicine | Admitting: *Deleted

## 2021-07-23 DIAGNOSIS — I5022 Chronic systolic (congestive) heart failure: Secondary | ICD-10-CM | POA: Insufficient documentation

## 2021-07-23 NOTE — Progress Notes (Signed)
Daily Session Note ? ?Patient Details  ?Name: Willie Clark ?MRN: [id number] ?Date of Birth: 08-09-1950 ?Referring Provider:   ?Flowsheet Row Pulmonary Rehab from 05/23/2021 in [facility] Cardiac and Pulmonary Rehab  ?Referring Provider [name] MD  ? ?  ? ? ?Encounter Date: 07/23/2021 ? ?Check In: ? Session Check In - 07/23/21 0935   ? ?  ? Check-In  ? Supervising physician immediately available to respond to emergencies See telemetry face sheet for immediately available ER MD   ? Location [facility]-Cardiac & Pulmonary Rehab   ? Staff Present Earlean Shawl, BS, ACSM CEP, Exercise Physiologist;Amanda Oletta Darter, BA, ACSM CEP, Exercise Physiologist;Aynsley Fleet, RN, BSN, CCRP   ? Virtual Visit No   ? Medication changes reported     No   ? Fall or balance concerns reported    No   ? Warm-up and Cool-down Performed on first and last piece of equipment   ? Resistance Training Performed Yes   ? VAD Patient? No   ? PAD/SET Patient? No   ?  ? Pain Assessment  ? Currently in Pain? No/denies   ? ?  ?  ? ?  ? ? ? ? ? ?Social History  ? ?Tobacco Use  ?Smoking Status Former  ? Packs/day: 2.50  ? Types: Cigarettes  ? Quit date: 08/21/1991  ? Years since quitting: 29.9  ?Smokeless Tobacco Never  ? ? ?Goals Met:  ?Proper associated with RPD/PD & O2 Sat ?Independence with exercise equipment ?Exercise tolerated well ?No report of concerns or symptoms today ? ?Goals Unmet:  ?Not Applicable ? ?Comments: Pt able to follow exercise prescription today without complaint.  Will continue to monitor for progression. ? ? ? ?Dr. Emily Filbert is Medical Director for [place].  ?Dr. Ottie Glazier is Medical Director for [facility] Pulmonary Rehabilitation. ?

## 2021-07-24 ENCOUNTER — Other Ambulatory Visit: Payer: Self-pay | Admitting: *Deleted

## 2021-07-24 MED ORDER — POTASSIUM CHLORIDE CRYS ER 20 MEQ PO TBCR
20.0000 meq | EXTENDED_RELEASE_TABLET | Freq: Every day | ORAL | 0 refills | Status: DC
Start: 2021-07-24 — End: 2021-12-10

## 2021-07-25 DIAGNOSIS — I5022 Chronic systolic (congestive) heart failure: Secondary | ICD-10-CM

## 2021-07-25 NOTE — Progress Notes (Signed)
? Patient ID: Willie Clark, male    DOB: 19-Apr-1951, 71 y.o.   MRN: [id number] ? ?HPI ? ?Willie Clark is a 71 y/o male with a history of CAD, hyperlipidemia, HTN, stroke, anxiety, depression, GERD, obstructive sleep apnea, PE, remote tobacco use and chronic heart failure.  ? ?Echo report from 06/03/21 reviewed and showed an EF of 45-50% along with mild LVH and mild Willie. Echo report from 11/15/20 reviewed and showed an EF of 40-45% along with mild LAE, mild Willie and normal PA pressure of 24.9 mmHg.  ? ?LHC/RHC completed 11/15/20 and showed: ?Severe three-vessel coronary artery disease including chronic total occlusions of the ostial LAD, OM1, and proximal RCA. ?Patent LCx stent with moderate in-stent restenosis (~50%), similar to prior catheterizations. ?Widely patent LIMA to LAD. ?Patent SVG to RPDA with patent proximal/mid graft stents that demonstrate mild in-stent restenosis (~10%).  Distal anastomotic stent is widely patent.  There is significant ectasia in the midportion of the graft. ?Patent SVG to OM with sequential 75% and 60% ostial and mid graft stenoses. ?Upper normal to mildly elevated left and right heart filling pressures. ?Normal pulmonary artery pressure. ?Mildly reduced Fick cardiac output/index. ?Successful PCI to SVG to OM with nonoverlapping Resolute Onyx 3.0 x 12 mm (ostial) and 3.0 x 15 mm (mid graft) drug-eluting stents with 0% residual stenosis and TIMI-3 flow. ? ?Admitted 06/01/21 due to chest pain. Noted to have leukocytosis, tachycardia and abdominal pain, and CT showed acute diverticulitis.  CTA chest showed no pneumonia or PE. Troponin elevation thought to be due demand ischemia. Cardiology consult obtained. Given antibiotics for sepsis. PT evaluation done.     Discharged after 4 days.  ? ?He presents today for a follow-up visit with a chief complaint of moderate fatigue upon minimal exertion. He describes this as chronic in nature. He has associated decreased appetite, cough, shortness of breath,  intermittent chest pain, pedal edema (improving), abdominal pain, nausea, light-headedness and difficulty sleeping along with this. He denies any abdominal distention, palpitations or weight gain.  ? ?Biggest complaint today is of his abdominal pain. Says that the pain worsens no matter what he eats or drinks so he hasn't been eating much lately. Scheduled for a colonoscopy on 07/30/21 and says that if he needs some sort of surgery, he will take the risk of the surgery because of the pain that he is in.  ? ?Recently had a stress test and is now holding his plavix until the colonoscopy.  ? ?Past Medical History:  ?Diagnosis Date  ? Abnormal stress test   ? Anxiety   ? Arthritis   ? Barrett's esophagus   ? Bowel obstruction ([place]) 02/2009  ? small bowel  ? CHF (congestive heart failure) ([place])   ? Chronic chest pain   ? Coronary artery disease   ? a. 1993 CABG; b. 2007 Redo CABG; c. 09/2015 Cath: LM mild dzs, LAD 100ost, RI mild dzs, LCX 60p, OM1 100, RCA 100ost, VG->OM2 min irregs, LIMA->LAD nl, VG->RPDA 30ost, 20d, nl EF->Med rx.  ? Depression   ? Diverticulitis   ? Diverticulosis   ? Erectile dysfunction   ? Fibromyalgia   ? Gastric ulcer   ? Gastritis   ? GERD (gastroesophageal reflux disease)   ? Headache   ? Hemorrhoids   ? HFrEF (heart failure with reduced ejection fraction) ([place])   ? a. 09/2016 Echo: EF 55-60%, no rwma, triv AI, mild Willie, mildly dil LA. -> as of April 2020: EF 40 to 45%.  Septal dyssynergy/hypokinesis due to postop state but otherwise unable to assess wall motion [name] due to poor study.  ? History of myocardial infarct at age less than 22 years   ? Hyperlipidemia   ? Hypertension   ? Kidney stones   ? going to [facility]  June 26 to see kidney specialist  ? Lightheadedness   ? Mass on back   ? Nephrolithiasis   ? Orthostatic hypotension   ? a. Improved after discontinuation of metoprolol.  ? OSA (obstructive sleep apnea)   ? a. On CPAP.  ? Pulmonary embolism ([place]) 01/2006  ? Post-op/treated  ?  Skin lesions, generalized   ? Stroke [facility]) 1993  ? right side weakness no blood thinners   on Aspirin  ? Syncope and collapse   ? TIA (transient ischemic attack) 08/13/2014  ? Urticaria   ? ?Past Surgical History:  ?Procedure Laterality Date  ? BACK SURGERY    ? bowel obstruction  01/2009  ? CARDIAC CATHETERIZATION  11/2006  ? patent grafts. Significant OM3 disease and occluded diagonals.  ? CARDIAC CATHETERIZATION  06/2010  ? patent grafts. significant ISR in proximal LCX but OM2 is bypassed and gives retrograde flow to OM3.   ? CARDIAC CATHETERIZATION  06/2013  ? [facility]: Patent grafts with 60% proximal in-stent restenosis in the left circumflex with FFR of 0.85  ? CARDIAC CATHETERIZATION Left 09/25/2015  ? Procedure: Left Heart Cath and Cors/Grafts Angiography;  Surgeon: Wellington Hampshire, MD;  Location: [place] CV [facility];  Service: Cardiovascular;  Laterality: Left;  ? CERVICAL FUSION    ? CHOLECYSTECTOMY    ? COLON SURGERY  01/2009  ? BOWEL RESECTION DUE TO SMALL BOWEL OBSTRUCTION  ? COLONOSCOPY    ? COLONOSCOPY WITH PROPOFOL N/A 10/28/2017  ? Procedure: COLONOSCOPY WITH PROPOFOL;  Surgeon: Lollie Sails, MD;  Location: [facility] ENDOSCOPY;  Service: Endoscopy;  Laterality: N/A;  ? CORONARY ANGIOPLASTY  2006  ? CORONARY ARTERY BYPASS GRAFT  1993/01/2006  ? redo at [facility]. LIMA to LAD, SVG to OM2 and SVG to RPDA  ? CORONARY STENT INTERVENTION N/A 06/20/2020  ? Procedure: CORONARY STENT INTERVENTION;  Surgeon: Nelva Bush, MD;  Location: [place] [facility];  Service: Cardiovascular;  Laterality: N/A;  ? CORONARY STENT INTERVENTION N/A 11/15/2020  ? Procedure: CORONARY STENT INTERVENTION;  Surgeon: Nelva Bush, MD;  Location: [place] [facility];  Service: Cardiovascular;  Laterality: N/A;  ? CYSTOSCOPY WITH STENT PLACEMENT Bilateral 09/24/2015  ? Procedure: CYSTOSCOPY WITH BILATERAL RETROGRADES, BILATERAL STENT PLACEMENT;  Surgeon: Festus Aloe, MD;  Location: [facility] ORS;  Service: Urology;  Laterality:  Bilateral;  ? ESOPHAGOGASTRODUODENOSCOPY N/A 10/25/2014  ? Procedure: ESOPHAGOGASTRODUODENOSCOPY (EGD);  Surgeon: Lollie Sails, MD;  Location: [facility] ENDOSCOPY;  Service: Endoscopy;  Laterality: N/A;  ? ESOPHAGOGASTRODUODENOSCOPY (EGD) WITH PROPOFOL N/A 02/03/2017  ? Procedure: ESOPHAGOGASTRODUODENOSCOPY (EGD) WITH PROPOFOL;  Surgeon: Lollie Sails, MD;  Location: [facility] ENDOSCOPY;  Service: Endoscopy;  Laterality: N/A;  ? ESOPHAGOGASTRODUODENOSCOPY (EGD) WITH PROPOFOL N/A 04/18/2017  ? Procedure: ESOPHAGOGASTRODUODENOSCOPY (EGD) WITH PROPOFOL;  Surgeon: Lollie Sails, MD;  Location: [facility] ENDOSCOPY;  Service: Endoscopy;  Laterality: N/A;  ? EYE SURGERY Bilateral   ? Cataract Extraction with IOL  ? HERNIA REPAIR    ? INGUINAL HERNIA REPAIR Bilateral 01/23/2016  ? Procedure: HERNIA REPAIR INGUINAL ADULT BILATERAL;  Surgeon: Leonie Green, MD;  Location: [facility] ORS;  Service: General;  Laterality: Bilateral;  ? LEFT HEART CATH AND CORS/GRAFTS ANGIOGRAPHY N/A 12/13/2019  ? Procedure: LEFT  HEART CATH AND CORS/GRAFTS ANGIOGRAPHY;  Surgeon: Wellington Hampshire, MD;  Location: [place] [facility];  Service: Cardiovascular;  Laterality: N/A;  ? LEFT HEART CATH AND CORS/GRAFTS ANGIOGRAPHY N/A 06/20/2020  ? Procedure: LEFT HEART CATH AND CORS/GRAFTS ANGIOGRAPHY;  Surgeon: Nelva Bush, MD;  Location: [place] [facility];  Service: Cardiovascular;  Laterality: N/A;  ? LUMBAR LAMINECTOMY/DECOMPRESSION MICRODISCECTOMY Left 10/01/2016  ? Procedure: Left Lumbar four-five Laminotomy for resection of synovial cyst;  Surgeon: Erline Levine, MD;  Location: [place];  Service: Neurosurgery;  Laterality: Left;  left  ? NECK SURGERY  06/2009  ? RIGHT/LEFT HEART CATH AND CORONARY/GRAFT ANGIOGRAPHY N/A 11/15/2020  ? Procedure: RIGHT/LEFT HEART CATH AND CORONARY/GRAFT ANGIOGRAPHY;  Surgeon: Nelva Bush, MD;  Location: [place] [facility];  Service: Cardiovascular;  Laterality: N/A;  ? SHOULDER SURGERY  2010  ? ?Family History   ?Problem Relation Age of Onset  ? Heart disease Father 76  ? Heart attack Father   ? Breast cancer Paternal Aunt   ? ?Social History  ? ?Tobacco Use  ? Smoking status: Former  ?  Packs/day: 2.50  ?  Types: Ciga

## 2021-07-25 NOTE — Progress Notes (Signed)
Daily Session Note ? ?Patient Details  ?Name: Willie Clark ?MRN: [id number] ?Date of Birth: [birth year] ?Referring Provider:   ?Flowsheet Row Pulmonary Rehab from 05/23/2021 in [facility] Cardiac and Pulmonary Rehab  ?Referring Provider [name] MD  ? ?  ? ? ?Encounter Date: 07/25/2021 ? ?Check In: ? Session Check In - 07/25/21 0923   ? ?  ? Check-In  ? Supervising physician immediately available to respond to emergencies See telemetry face sheet for immediately available ER MD   ? Location [facility]-Cardiac & Pulmonary Rehab   ? Staff Present Birdie Sons, MPA, RN;Joseph Allerton, Sharren Bridge, MS, ASCM CEP, Exercise Physiologist;Melissa Tilford Pillar, RDN, LDN   ? Virtual Visit No   ? Medication changes reported     Yes   ? Comments not taking Plavix any longer   ? Fall or balance concerns reported    No   ? Tobacco Cessation No Change   ? Warm-up and Cool-down Performed on first and last piece of equipment   ? Resistance Training Performed Yes   ? VAD Patient? No   ? PAD/SET Patient? No   ?  ? Pain Assessment  ? Currently in Pain? No/denies   ? ?  ?  ? ?  ? ? ? ? ? ?Social History  ? ?Tobacco Use  ?Smoking Status Former  ? Packs/day: 2.50  ? Types: Cigarettes  ? Quit date: 08/21/1991  ? Years since quitting: 29.9  ?Smokeless Tobacco Never  ? ? ?Goals Met:  ?Independence with exercise equipment ?Exercise tolerated well ?No report of concerns or symptoms today ?Strength training completed today ? ?Goals Unmet:  ?Not Applicable ? ?Comments: Pt able to follow exercise prescription today without complaint.  Will continue to monitor for progression. ? ? ? ?Dr. Emily Filbert is Medical Director for [facility].  ?Dr. Ottie Glazier is Medical Director for [facility] Pulmonary Rehabilitation. ?

## 2021-07-26 ENCOUNTER — Encounter: Payer: Self-pay | Admitting: Family

## 2021-07-26 ENCOUNTER — Ambulatory Visit: Payer: Medicare HMO | Attending: Family | Admitting: Family

## 2021-07-26 VITALS — BP 118/71 | HR 51 | Resp 18 | Ht 69.0 in | Wt 202.0 lb

## 2021-07-26 DIAGNOSIS — F17210 Nicotine dependence, cigarettes, uncomplicated: Secondary | ICD-10-CM | POA: Diagnosis not present

## 2021-07-26 DIAGNOSIS — K219 Gastro-esophageal reflux disease without esophagitis: Secondary | ICD-10-CM | POA: Diagnosis not present

## 2021-07-26 DIAGNOSIS — Z7984 Long term (current) use of oral hypoglycemic drugs: Secondary | ICD-10-CM | POA: Insufficient documentation

## 2021-07-26 DIAGNOSIS — I110 Hypertensive heart disease with heart failure: Secondary | ICD-10-CM | POA: Diagnosis not present

## 2021-07-26 DIAGNOSIS — G4733 Obstructive sleep apnea (adult) (pediatric): Secondary | ICD-10-CM

## 2021-07-26 DIAGNOSIS — Z09 Encounter for follow-up examination after completed treatment for conditions other than malignant neoplasm: Secondary | ICD-10-CM | POA: Insufficient documentation

## 2021-07-26 DIAGNOSIS — R5383 Other fatigue: Secondary | ICD-10-CM | POA: Insufficient documentation

## 2021-07-26 DIAGNOSIS — Z955 Presence of coronary angioplasty implant and graft: Secondary | ICD-10-CM | POA: Insufficient documentation

## 2021-07-26 DIAGNOSIS — I10 Essential (primary) hypertension: Secondary | ICD-10-CM | POA: Diagnosis not present

## 2021-07-26 DIAGNOSIS — F32A Depression, unspecified: Secondary | ICD-10-CM | POA: Diagnosis not present

## 2021-07-26 DIAGNOSIS — Z8673 Personal history of transient ischemic attack (TIA), and cerebral infarction without residual deficits: Secondary | ICD-10-CM | POA: Diagnosis not present

## 2021-07-26 DIAGNOSIS — R059 Cough, unspecified: Secondary | ICD-10-CM | POA: Diagnosis not present

## 2021-07-26 DIAGNOSIS — Z9989 Dependence on other enabling machines and devices: Secondary | ICD-10-CM | POA: Diagnosis not present

## 2021-07-26 DIAGNOSIS — E785 Hyperlipidemia, unspecified: Secondary | ICD-10-CM | POA: Diagnosis not present

## 2021-07-26 DIAGNOSIS — R110 Nausea: Secondary | ICD-10-CM | POA: Diagnosis not present

## 2021-07-26 DIAGNOSIS — I2510 Atherosclerotic heart disease of native coronary artery without angina pectoris: Secondary | ICD-10-CM | POA: Insufficient documentation

## 2021-07-26 DIAGNOSIS — R0602 Shortness of breath: Secondary | ICD-10-CM | POA: Diagnosis not present

## 2021-07-26 DIAGNOSIS — I5022 Chronic systolic (congestive) heart failure: Secondary | ICD-10-CM | POA: Diagnosis not present

## 2021-07-26 DIAGNOSIS — K5792 Diverticulitis of intestine, part unspecified, without perforation or abscess without bleeding: Secondary | ICD-10-CM | POA: Diagnosis not present

## 2021-07-26 DIAGNOSIS — R42 Dizziness and giddiness: Secondary | ICD-10-CM | POA: Insufficient documentation

## 2021-07-26 DIAGNOSIS — R109 Unspecified abdominal pain: Secondary | ICD-10-CM | POA: Insufficient documentation

## 2021-07-26 DIAGNOSIS — Z79899 Other long term (current) drug therapy: Secondary | ICD-10-CM | POA: Diagnosis not present

## 2021-07-26 DIAGNOSIS — Z7901 Long term (current) use of anticoagulants: Secondary | ICD-10-CM | POA: Insufficient documentation

## 2021-07-26 NOTE — Patient Instructions (Addendum)
Continue weighing daily and call for an overnight weight gain of 3 pounds or more or a weekly weight gain of more than 5 pounds. ? ? ?If you have voicemail, please make sure your mailbox is cleaned out so that we may leave a message and please make sure to listen to any voicemails.  ? ? ?Call us after your surgery to schedule another appointment ?

## 2021-07-27 ENCOUNTER — Encounter: Payer: Self-pay | Admitting: *Deleted

## 2021-07-27 ENCOUNTER — Encounter: Payer: Medicare HMO | Admitting: *Deleted

## 2021-07-27 DIAGNOSIS — I5022 Chronic systolic (congestive) heart failure: Secondary | ICD-10-CM

## 2021-07-27 NOTE — Progress Notes (Signed)
Daily Session Note ? ?Patient Details  ?Name: Willie Clark ?MRN: [id number] ?Date of Birth: 1950/08/02 ?Referring Provider:   ?Flowsheet Row Pulmonary Rehab from 05/23/2021 in [facility] Cardiac and Pulmonary Rehab  ?Referring Provider [name] MD  ? ?  ? ? ?Encounter Date: 07/27/2021 ? ?Check In: ? Session Check In - 07/27/21 0935   ? ?  ? Check-In  ? Supervising physician immediately available to respond to emergencies See telemetry face sheet for immediately available ER MD   ? Location [facility]-Cardiac & Pulmonary Rehab   ? Staff Present Renita Papa, RN BSN;Joseph Hood, RCP,RRT,BSRT;Laureen Keefton, [place], RRT, CPFT   ? Virtual Visit No   ? Medication changes reported     No   ? Fall or balance concerns reported    No   ? Warm-up and Cool-down Performed on first and last piece of equipment   ? Resistance Training Performed Yes   ? VAD Patient? No   ? PAD/SET Patient? No   ?  ? Pain Assessment  ? Currently in Pain? No/denies   ? ?  ?  ? ?  ? ? ? ? ? ?Social History  ? ?Tobacco Use  ?Smoking Status Former  ? Packs/day: 2.50  ? Types: Cigarettes  ? Quit date: 08/21/1991  ? Years since quitting: 29.9  ?Smokeless Tobacco Never  ? ? ?Goals Met:  ?Independence with exercise equipment ?Exercise tolerated well ?No report of concerns or symptoms today ?Strength training completed today ? ?Goals Unmet:  ?Not Applicable ? ?Comments: Pt able to follow exercise prescription today without complaint.  Will continue to monitor for progression. ? ? ? ?Dr. Emily Filbert is Medical Director for [place].  ?Dr. Ottie Glazier is Medical Director for [facility] Pulmonary Rehabilitation. ?

## 2021-07-30 ENCOUNTER — Encounter
Admission: RE | Disposition: A | Discharge status: Home / Self Care | Payer: Self-pay | Source: Home / Self Care | Attending: Gastroenterology

## 2021-07-30 ENCOUNTER — Encounter: Payer: Self-pay | Admitting: *Deleted

## 2021-07-30 ENCOUNTER — Ambulatory Visit
Admission: RE | Admit: 2021-07-30 | Discharge: 2021-07-30 | Disposition: A | Discharge status: Home / Self Care | Payer: Medicare HMO | Attending: Gastroenterology | Admitting: Gastroenterology

## 2021-07-30 ENCOUNTER — Telehealth: Payer: Self-pay | Admitting: Physician Assistant

## 2021-07-30 ENCOUNTER — Ambulatory Visit: Payer: Medicare HMO | Admitting: Anesthesiology

## 2021-07-30 DIAGNOSIS — Z7982 Long term (current) use of aspirin: Secondary | ICD-10-CM | POA: Insufficient documentation

## 2021-07-30 DIAGNOSIS — F419 Anxiety disorder, unspecified: Secondary | ICD-10-CM | POA: Diagnosis not present

## 2021-07-30 DIAGNOSIS — G4733 Obstructive sleep apnea (adult) (pediatric): Secondary | ICD-10-CM | POA: Insufficient documentation

## 2021-07-30 DIAGNOSIS — D120 Benign neoplasm of cecum: Secondary | ICD-10-CM | POA: Diagnosis not present

## 2021-07-30 DIAGNOSIS — Z09 Encounter for follow-up examination after completed treatment for conditions other than malignant neoplasm: Secondary | ICD-10-CM | POA: Insufficient documentation

## 2021-07-30 DIAGNOSIS — K219 Gastro-esophageal reflux disease without esophagitis: Secondary | ICD-10-CM | POA: Insufficient documentation

## 2021-07-30 DIAGNOSIS — M797 Fibromyalgia: Secondary | ICD-10-CM | POA: Insufficient documentation

## 2021-07-30 DIAGNOSIS — Z955 Presence of coronary angioplasty implant and graft: Secondary | ICD-10-CM | POA: Diagnosis not present

## 2021-07-30 DIAGNOSIS — I110 Hypertensive heart disease with heart failure: Secondary | ICD-10-CM | POA: Diagnosis not present

## 2021-07-30 DIAGNOSIS — K5730 Diverticulosis of large intestine without perforation or abscess without bleeding: Secondary | ICD-10-CM | POA: Diagnosis not present

## 2021-07-30 DIAGNOSIS — Z8711 Personal history of peptic ulcer disease: Secondary | ICD-10-CM | POA: Insufficient documentation

## 2021-07-30 DIAGNOSIS — K5520 Angiodysplasia of colon without hemorrhage: Secondary | ICD-10-CM | POA: Diagnosis not present

## 2021-07-30 DIAGNOSIS — Z951 Presence of aortocoronary bypass graft: Secondary | ICD-10-CM | POA: Diagnosis not present

## 2021-07-30 DIAGNOSIS — F32A Depression, unspecified: Secondary | ICD-10-CM | POA: Diagnosis not present

## 2021-07-30 DIAGNOSIS — I2510 Atherosclerotic heart disease of native coronary artery without angina pectoris: Secondary | ICD-10-CM | POA: Diagnosis not present

## 2021-07-30 DIAGNOSIS — I5022 Chronic systolic (congestive) heart failure: Secondary | ICD-10-CM | POA: Diagnosis not present

## 2021-07-30 DIAGNOSIS — E119 Type 2 diabetes mellitus without complications: Secondary | ICD-10-CM | POA: Diagnosis not present

## 2021-07-30 DIAGNOSIS — I69951 Hemiplegia and hemiparesis following unspecified cerebrovascular disease affecting right dominant side: Secondary | ICD-10-CM | POA: Insufficient documentation

## 2021-07-30 DIAGNOSIS — I252 Old myocardial infarction: Secondary | ICD-10-CM | POA: Insufficient documentation

## 2021-07-30 HISTORY — DX: Induration penis plastica: N48.6

## 2021-07-30 HISTORY — PX: COLONOSCOPY WITH PROPOFOL: SHX5780

## 2021-07-30 SURGERY — COLONOSCOPY WITH PROPOFOL
Anesthesia: General

## 2021-07-30 MED ORDER — PHENYLEPHRINE 40 MCG/ML (10ML) SYRINGE FOR IV PUSH (FOR BLOOD PRESSURE SUPPORT)
PREFILLED_SYRINGE | INTRAVENOUS | Status: AC
  Filled 2021-07-30: qty 10

## 2021-07-30 MED ORDER — LIDOCAINE HCL (PF) 2 % IJ SOLN
INTRAMUSCULAR | Status: AC
  Filled 2021-07-30: qty 5

## 2021-07-30 MED ORDER — MIDAZOLAM HCL 2 MG/2ML IJ SOLN
INTRAMUSCULAR | Status: AC
Start: 2021-07-30 — End: ?
  Filled 2021-07-30: qty 2

## 2021-07-30 MED ORDER — PROPOFOL 10 MG/ML IV BOLUS
INTRAVENOUS | Status: DC | PRN
  Administered 2021-07-30: 10 mg via INTRAVENOUS
  Administered 2021-07-30 (×2): 20 mg via INTRAVENOUS

## 2021-07-30 MED ORDER — SODIUM CHLORIDE 0.9 % IV SOLN: INTRAVENOUS | Status: DC

## 2021-07-30 MED ORDER — MIDAZOLAM HCL 2 MG/2ML IJ SOLN
INTRAMUSCULAR | Status: DC | PRN
  Administered 2021-07-30: 2 mg via INTRAVENOUS

## 2021-07-30 MED ORDER — FENTANYL CITRATE (PF) 100 MCG/2ML IJ SOLN
INTRAMUSCULAR | Status: AC
Start: 2021-07-30 — End: ?
  Filled 2021-07-30: qty 2

## 2021-07-30 MED ORDER — GLYCOPYRROLATE 0.2 MG/ML IJ SOLN
INTRAMUSCULAR | Status: AC
  Filled 2021-07-30: qty 1

## 2021-07-30 MED ORDER — KETOROLAC TROMETHAMINE 30 MG/ML IJ SOLN
INTRAMUSCULAR | Status: AC
  Filled 2021-07-30: qty 1

## 2021-07-30 MED ORDER — LIDOCAINE HCL (CARDIAC) PF 100 MG/5ML IV SOSY
PREFILLED_SYRINGE | INTRAVENOUS | Status: DC | PRN
  Administered 2021-07-30: 50 mg via INTRAVENOUS

## 2021-07-30 MED ORDER — FENTANYL CITRATE (PF) 100 MCG/2ML IJ SOLN
INTRAMUSCULAR | Status: DC | PRN
  Administered 2021-07-30: 25 ug via INTRAVENOUS
  Administered 2021-07-30: 50 ug via INTRAVENOUS
  Administered 2021-07-30: 25 ug via INTRAVENOUS

## 2021-07-30 MED ORDER — DEXMEDETOMIDINE HCL IN NACL 80 MCG/20ML IV SOLN
INTRAVENOUS | Status: AC
  Filled 2021-07-30: qty 20

## 2021-07-30 MED ORDER — PROPOFOL 500 MG/50ML IV EMUL
INTRAVENOUS | Status: DC | PRN
  Administered 2021-07-30: 50 ug/kg/min via INTRAVENOUS

## 2021-07-30 NOTE — Interval H&P Note (Signed)
History and Physical Interval Note: ? ?07/30/2021 ?11:49 AM ? ?Willie Clark  has presented today for surgery, with the diagnosis of Diverticulitis.  The various methods of treatment have been discussed with the patient and family. After consideration of risks, benefits and other options for treatment, the patient has consented to  Procedure(s): ?COLONOSCOPY WITH PROPOFOL (N/A) as a surgical intervention.  The patient's history has been reviewed, patient examined, no change in status, stable for surgery.  I have reviewed the patient's chart and labs.  Questions were answered to the patient's satisfaction.   ? ? ?[name] [name] ? ?Ok to proceed with colonoscopy ?

## 2021-07-30 NOTE — Transfer of Care (Signed)
Immediate Anesthesia Transfer of Care Note ? ?Patient: Willie Clark ? ?Procedure(s) Performed: COLONOSCOPY WITH PROPOFOL ? ?Patient Location: PACU ? ?Anesthesia Type:General ? ?Level of Consciousness: sedated ? ?Airway & Oxygen Therapy: Patient Spontanous Breathing ? ?Post-op Assessment: Report given to RN and Post -op Vital signs reviewed and stable ? ?Post vital signs: Reviewed and stable ? ?Last Vitals:  ?Vitals Value Taken Time  ?BP    ?Temp    ?Pulse    ?Resp    ?SpO2    ? ? ?Last Pain:  ?Vitals:  ? 07/30/21 1047  ?TempSrc: Temporal  ?PainSc: 0-No pain  ?   ? ?  ? ?Complications: No notable events documented. ?

## 2021-07-30 NOTE — Telephone Encounter (Signed)
? ?  Pre-operative Risk Assessment  ?  ?Patient Name: Willie Clark  ?DOB: 10-07-1950 ?MRN: [id number]  ? ?  ? ?Request for Surgical Clearance   ? ?Procedure:   lumbar spine inj ? ?Date of Surgery:  Clearance TBD                              ?   ?Surgeon:  not noted  ?Surgeon's Group or Practice Name:  [facility] ?Phone number:  [phone] ?Fax number:  [phone] ?  ?Type of Clearance Requested:   ?- Medical  ?- Pharmacy:  Hold Clopidogrel (Plavix) 7 days prior  ?  ?Type of Anesthesia:  Not Indicated ?  ?Additional requests/questions:   ? ?Signed, ?Clarisse Gouge   ?07/30/2021, 2:05 PM  ? ?

## 2021-07-30 NOTE — Op Note (Signed)
[facility] ?Gastroenterology ?Patient Name: Willie Clark ?Procedure Date: 07/30/2021 11:46 AM ?MRN: [id number] ?Account #: [id number] ?Date of Birth: 04/13/1951 ?Admit Type: Outpatient ?Age: 71 ?Room: [facility] ENDO ROOM 3 ?Gender: Male ?Note Status: Finalized ?Instrument Name: Peds Colonoscope [id number] ?Procedure:             Colonoscopy ?Indications:           Follow-up of diverticulitis ?Providers:             Andrey Farmer MD, MD ?Referring MD:          Mertie Clause. Fletcher Anon, MD (Referring MD) ?Medicines:             Monitored Anesthesia Care ?Complications:         No immediate complications. Estimated blood loss:  ?                       Minimal. ?Procedure:             Pre-Anesthesia Assessment: ?                       - Prior to the procedure, a History and Physical was  ?                       performed, and patient medications and allergies were  ?                       reviewed. The patient is competent. The risks and  ?                       benefits of the procedure and the sedation options and  ?                       risks were discussed with the patient. All questions  ?                       were answered and informed consent was obtained.  ?                       Patient identification and proposed procedure were  ?                       verified by the physician, the nurse, the  ?                       anesthesiologist, the anesthetist and the technician  ?                       in the endoscopy suite. Mental Status Examination:  ?                       alert and oriented. Airway Examination: normal  ?                       oropharyngeal airway and neck mobility. Respiratory  ?                       Examination: clear to auscultation. CV Examination:  ?  normal. Prophylactic Antibiotics: The patient does not  ?                       require prophylactic antibiotics. Prior  ?                       Anticoagulants: The patient has taken Plavix  ?                        (clopidogrel), last dose was 5 days prior to  ?                       procedure. ASA Grade Assessment: III - A patient with  ?                       severe systemic disease. After reviewing the risks and  ?                       benefits, the patient was deemed in satisfactory  ?                       condition to undergo the procedure. The anesthesia  ?                       plan was to use monitored anesthesia care (MAC).  ?                       Immediately prior to administration of medications,  ?                       the patient was re-assessed for adequacy to receive  ?                       sedatives. The heart rate, respiratory rate, oxygen  ?                       saturations, blood pressure, adequacy of pulmonary  ?                       ventilation, and response to care were monitored  ?                       throughout the procedure. The physical status of the  ?                       patient was re-assessed after the procedure. ?                       After obtaining informed consent, the colonoscope was  ?                       passed under direct vision. Throughout the procedure,  ?                       the patient's blood pressure, pulse, and oxygen  ?                       saturations were monitored continuously. The  ?  Colonoscope was introduced through the anus and  ?                       advanced to the the cecum, identified by appendiceal  ?                       orifice and ileocecal valve. The colonoscopy was  ?                       performed without difficulty. The patient tolerated  ?                       the procedure well. The quality of the bowel  ?                       preparation was good. ?Findings: ?     The perianal and digital rectal examinations were normal. ?     A single small localized angiodysplastic lesion without bleeding was  ?     found in the cecum. ?     A 3 mm polyp was found in the cecum. The polyp was sessile. The polyp  ?     was removed  with a cold snare. Resection and retrieval were complete.  ?     Estimated blood loss was minimal. ?     A few small-mouthed diverticula were found in the hepatic flexure and  ?     ascending colon. ?     Multiple small and large-mouthed diverticula were found in the sigmoid  ?     colon, descending colon and splenic flexure. ?     The exam was otherwise without abnormality on direct and retroflexion  ?     views. ?Impression:            - A single non-bleeding colonic angiodysplastic lesion. ?                       - One 3 mm polyp in the cecum, removed with a cold  ?                       snare. Resected and retrieved. ?                       - Diverticulosis at the hepatic flexure and in the  ?                       ascending colon. ?                       - Diverticulosis in the sigmoid colon, in the  ?                       descending colon and at the splenic flexure. ?                       - The examination was otherwise normal on direct and  ?                       retroflexion views. ?Recommendation:        - Discharge patient to home. ?                       -  Resume previous diet. ?                       - Resume Plavix (clopidogrel) at prior dose today. ?                       - Await pathology results. ?                       - Repeat colonoscopy for surveillance based on  ?                       pathology results. ?                       - Return to referring physician as previously  ?                       scheduled. ?Procedure Code(s):     --- Professional --- ?                       [phone], Colonoscopy, flexible; with removal of  ?                       tumor(s), polyp(s), or other lesion(s) by snare  ?                       technique ?Diagnosis Code(s):     --- Professional --- ?                       K55.20, Angiodysplasia of colon without hemorrhage ?                       K63.5, Polyp of colon ?                       K57.32, Diverticulitis of large intestine without  ?                        perforation or abscess without bleeding ?                       K57.30, Diverticulosis of large intestine without  ?                       perforation or abscess without bleeding ?CPT copyright 2019 American Medical Association. All rights reserved. ?The codes documented in this report are preliminary and upon coder review may  ?be revised to meet current compliance requirements. ?Andrey Farmer MD, MD ?07/30/2021 12:29:55 PM ?Number of Addenda: 0 ?Note Initiated On: 07/30/2021 11:46 AM ?Scope Withdrawal Time: 0 hours 18 minutes 19 seconds  ?Total Procedure Duration: 0 hours 29 minutes 54 seconds  ?Estimated Blood Loss:  Estimated blood loss was minimal. ?     [facility] ?

## 2021-07-30 NOTE — Anesthesia Preprocedure Evaluation (Addendum)
Anesthesia Evaluation  ?Patient identified by MRN, date of birth, ID band ?Patient awake ? ? ? ?Reviewed: ?Allergy & Precautions, NPO status , Patient's Chart, lab work & pertinent test results ? ?History of Anesthesia Complications ?Negative for: history of anesthetic complications ? ?Airway ?Mallampati: II ? ? ?Neck ROM: Full ? ? ? Dental ? ?(+) Poor Dentition ?  ?Pulmonary ?sleep apnea and Continuous Positive Airway Pressure Ventilation , former smoker (quit 1993),  ?  ?Pulmonary exam normal ?breath sounds clear to auscultation ? ? ? ? ? ? Cardiovascular ?hypertension, + CAD (s/p MI, CABG, stents on Plavix) and +CHF  ?Normal cardiovascular exam ?Rhythm:Regular Rate:Normal ? ?Myocardial perfusion 07/19/21:  ?No ST deviation was noted. ?Pharmacological myocardial perfusion imaging study with a small region of ischemia in the inferolateral wall ?Small fixed defect distal anterior/apical wall ?Fixed basal to mid septal wall perfusion defect ?Normal wall motion, EF estimated at 53% ?GI uptake artifact noted ?No EKG changes concerning for ischemia at peak stress or in recovery. ?Low to moderate risk scan ? ?ECG 07/12/21: SR with occasional PACs ?  ?Neuro/Psych ? 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 GI/Hepatic ?PUD, GERD  ,Diverticulitis  ?  ?Endo/Other  ?negative endocrine ROS ? Renal/GU ?Renal disease (nephrolithiasis)  ? ?  ?Musculoskeletal ? ?(+) Arthritis , Fibromyalgia - ? Abdominal ?  ?Peds ? Hematology ?negative hematology ROS ?(+)   ?Anesthesia Other Findings ?Cardiology note 07/12/21:  ?1. CAD status post CABG status post redo CABG status post subsequent PCI with stable angina: He continues to note stable episodes of chest pain, that are notable when walking on a treadmill, though improved when riding on a recumbent bike.  Recently admitted for severe sepsis in the setting of Streptococcus B bacteremia and recurrent  diverticulitis with noted demand ischemia with mild elevation in high-sensitivity troponin.  Currently chest pain-free.  Schedule Lexiscan MPI to evaluate for high risk ischemia given elevated troponin and in the context of preoperative cardiac evaluation.  He should remain on DAPT with aspirin and clopidogrel without interruption for a minimum of 12 months dating back to date of last PCI (11/15/2020).  Aggressive risk factor modification and secondary prevention is encouraged along with continuation of carvedilol, Imdur, rosuvastatin, and Vascepa. ?? ?2. HFrEF secondary to ICM: He appears to be euvolemic and well compensated.  Most recent echo did show a slight improvement in his LV systolic function with an EF of 45 to 50%.  Continue current medical therapy including carvedilol, Jardiance, furosemide, and spironolactone.  Defer rechallenge of ACE inhibitor or ARB given documented anaphylaxis noted with Entresto.  Update BMP.  CHF education. ?? ?3. HTN: Blood pressure is well controlled in the office today.  Continue current medical therapy as outlined above. ?? ?4. HLD: LDL 61 with a triglyceride of 317.  He remains on rosuvastatin and Vascepa. ?? ?5. Mild carotid artery disease: Prior carotid artery ultrasound showed less than 40% stenosis bilaterally. ?? ?6. Small AAA: Noted on ultrasound in 02/2021 measuring 3 cm.  Repeat ultrasound has been recommended in 02/2023.  This will need to be scheduled as this date approaches. ?? ?7. Preoperative cardiac risk stratification: The patient is scheduled to undergo a colonoscopy as part of his recurrent diverticulitis evaluation.  Dr. Fletcher Anon has already discussed this with Dr. Haig Prophet with recommendation for the patient to remain on DAPT without interruption while undergoing colonoscopy.  The patient has also been evaluated by general surgery with the patient preferring to  undergo surgical treatment for his recurrent diverticulitis with possible planned left hemicolectomy.   Regardless of coronary testing or intervention, the patient will be high risk.  He informs me at our visit today that he is "willing to die" with surgical intervention.  We will pursue a Lexiscan MPI as outlined above to evaluate for high risk ischemia.  Functional status is difficult to assess at this time.  Pending Lexiscan MPI results, we will reach out to the patient's primary cardiologist for further discussion regarding DAPT and timing of potential abdominal surgery. ? Reproductive/Obstetrics ? ?  ? ? ? ? ? ? ? ? ? ? ? ? ? ?  ?  ? ? ? ? ? ? ? ?Anesthesia Physical ?Anesthesia Plan ? ?ASA: 4 ? ?Anesthesia Plan: General  ? ?Post-op Pain Management:   ? ?Induction: Intravenous ? ?PONV Risk Score and Plan: 2 and Propofol infusion, TIVA and Treatment may vary due to age or medical condition ? ?Airway Management Planned: Natural Airway ? ?Additional Equipment:  ? ?Intra-op Plan:  ? ?Post-operative Plan:  ? ?Informed Consent: I have reviewed the patients History and Physical, chart, labs and discussed the procedure including the risks, benefits and alternatives for the proposed anesthesia with the patient or authorized representative who has indicated his/her understanding and acceptance.  ? ? ? ? ? ?Plan Discussed with: CRNA ? ?Anesthesia Plan Comments: (LMA/GETA backup discussed.  Patient consented for risks of anesthesia including but not limited to:  ?- adverse reactions to medications ?- damage to eyes, teeth, lips or other oral mucosa ?- nerve damage due to positioning  ?- sore throat or hoarseness ?- damage to heart, brain, nerves, lungs, other parts of body or loss of life ? ?Informed patient about role of CRNA in peri- and intra-operative care.  Patient voiced understanding.)  ? ? ? ? ? ? ?Anesthesia Quick Evaluation ? ?

## 2021-07-30 NOTE — H&P (Signed)
Outpatient short stay form Pre-procedure ?07/30/2021  ?Lesly Rubenstein, MD ? ?Primary Physician: [facility] [place] ? ?Reason for visit:  History of diverticulitis ? ?History of present illness:   ? ?71 y/o gentleman with history of CAD s/p stents, fibromyalgia, and DM II here for colonoscopy due to history of recurrent diverticulitis. Last took plavix 5 days ago. Takes aspirin. History of small bowel resection due to SBO. No family history of colon cancer. ? ? ? ?Current Facility-Administered Medications:  ?  0.9 %  sodium chloride infusion, , Intravenous, Continuous, Talita Recht, [name], MD, Last Rate: 20 mL/hr at 07/30/21 1104, New Bag at 07/30/21 1104 ? ?Medications Prior to Admission  ?Medication Sig Dispense Refill Last Dose  ? albuterol (PROVENTIL HFA;VENTOLIN HFA) 108 (90 Base) MCG/ACT inhaler Inhale into the lungs every 4 (four) hours as needed for wheezing or shortness of breath.   Past Week  ? aspirin EC 81 MG EC tablet Take 1 tablet (81 mg total) by mouth daily. Swallow whole. 30 tablet 11 07/29/2021  ? carvedilol (COREG) 3.125 MG tablet TAKE 1 TABLET (3.125 MG TOTAL) BY MOUTH 2 (TWO) TIMES DAILY WITH A MEAL. 180 tablet 0 07/29/2021  ? fexofenadine (ALLEGRA) 180 MG tablet Take 180 mg by mouth daily.   07/29/2021  ? gabapentin (NEURONTIN) 800 MG tablet Take 1 tablet by mouth 3 (three) times daily.   07/29/2021  ? isosorbide mononitrate (IMDUR) 60 MG 24 hr tablet Take 60 mg by mouth daily.   07/29/2021  ? losartan (COZAAR) 25 MG tablet Take 25 mg by mouth daily.   07/29/2021  ? oxyCODONE (OXY IR/ROXICODONE) 5 MG immediate release tablet 0.5-1 mg every 6 (six) hours as needed.   07/29/2021  ? pantoprazole (PROTONIX) 40 MG tablet Take 40 mg by mouth 2 (two) times daily.    07/29/2021  ? potassium chloride SA (KLOR-CON M20) 20 MEQ tablet Take 1 tablet (20 mEq total) by mouth daily. 90 tablet 0 07/29/2021  ? rOPINIRole (REQUIP) 1 MG tablet Take 1 mg by mouth 2 (two) times daily.   07/29/2021  ? rosuvastatin  (CRESTOR) 40 MG tablet TAKE 1 TABLET EVERY DAY ( DOSE INCREASE ) 90 tablet 0 07/29/2021  ? torsemide (DEMADEX) 20 MG tablet Take 2 tablets (40 mg total) by mouth daily. 180 tablet 2 07/29/2021  ? citalopram (CELEXA) 20 MG tablet Take 20 mg by mouth daily.      ? clopidogrel (PLAVIX) 75 MG tablet TAKE (1) TABLET 75 MG DAILY. 90 tablet 3 07/23/2021  ? dicyclomine (BENTYL) 10 MG capsule TAKE 1 CAPSULE THREE TIMES DAILY AS NEEDED     ? docusate calcium (SURFAK) 240 MG capsule Take 240 mg by mouth daily.     ? empagliflozin (JARDIANCE) 10 MG TABS tablet Take 1 tablet (10 mg total) by mouth daily before breakfast. 90 tablet 3   ? EPINEPHrine 0.3 mg/0.3 mL IJ SOAJ injection Inject 0.3 mg into the muscle as needed.      ? finasteride (PROSCAR) 5 MG tablet Take 5 mg by mouth daily.     ? mesalamine (APRISO) 0.375 g 24 hr capsule Take 1.4 mg by mouth daily.     ? Multiple Vitamins-Minerals (HAIR SKIN AND NAILS FORMULA PO) Take 1 capsule by mouth daily.     ? NALTREXONE HCL PO Take 2.5 mg by mouth daily.     ? nitroGLYCERIN (NITROSTAT) 0.4 MG SL tablet Place 1 tablet (0.4 mg total) under the tongue every 5 (five) minutes  as needed for chest pain. 90 tablet 1   ? nystatin-triamcinolone ointment (MYCOLOG) Apply 1 application topically 2 (two) times daily. 30 g 0   ? ondansetron (ZOFRAN) 4 MG tablet Take 4 mg by mouth every 8 (eight) hours as needed for nausea or vomiting.     ? Peppermint Oil (IBGARD PO) Take by mouth daily at 2 am.     ? Probiotic Product (PROBIOTIC PO) Take by mouth daily at 8 pm.     ? spironolactone (ALDACTONE) 25 MG tablet TAKE 1 TABLET EVERY DAY 90 tablet 1   ? traMADol (ULTRAM) 50 MG tablet Take 50 mg by mouth every 6 (six) hours as needed. (Patient not taking: Reported on 07/26/2021)   Not Taking  ? VASCEPA 1 g capsule TAKE 2 CAPSULES TWICE DAILY 360 capsule 1   ? vitamin B-12 (CYANOCOBALAMIN) 1000 MCG tablet Take 1,000 mcg by mouth daily.     ? ? ? ?Allergies  ?Allergen Reactions  ? Amitriptyline Other (See  Comments)  ?  Pt unsure of what happens ?Other reaction(s): Other (See Comments) ?Pt unsure of what happens ?Pt unsure of what happens  ? Cyclobenzaprine Other (See Comments) and Hives  ?  Pt unsure what hapens ?Pt unsure what hapens ?Pt unsure what hapens ?  ? Dicyclomine Shortness Of Breath and Swelling  ? Sacubitril-Valsartan Anaphylaxis and Itching  ? Amitriptyline Hcl   ? Berniece Salines Flavor Diarrhea  ? Hydrocodone   ? Fish Oil Itching  ? Lidocaine Nausea And Vomiting, Other (See Comments) and Nausea Only  ?  Nasal Spray (only time had reaction) ?Other reaction(s): Dizziness ?Nasal Spray (only time had reaction)  ? Omega-3 Fatty Acids-Vitamin E Itching  ? Other Diarrhea and Itching  ?  Reactive agents: Onions, bacon, steak  ? Oxycodone-Acetaminophen Other (See Comments)  ?  Lowers BP ?Other reaction(s): Other (See Comments) ?Lowers BP ?Lowers BP  ? Sulfa Antibiotics Itching  ? ? ? ?Past Medical History:  ?Diagnosis Date  ? Abnormal stress test   ? Anxiety   ? Arthritis   ? Barrett's esophagus   ? Bowel obstruction ([place]) 02/2009  ? small bowel  ? CHF (congestive heart failure) ([place])   ? Chronic chest pain   ? Coronary artery disease   ? a. 1993 CABG; b. 2007 Redo CABG; c. 09/2015 Cath: LM mild dzs, LAD 100ost, RI mild dzs, LCX 60p, OM1 100, RCA 100ost, VG->OM2 min irregs, LIMA->LAD nl, VG->RPDA 30ost, 20d, nl EF->Med rx.  ? Depression   ? Diverticulitis   ? Diverticulosis   ? Erectile dysfunction   ? Fibromyalgia   ? Gastric ulcer   ? Gastritis   ? GERD (gastroesophageal reflux disease)   ? Headache   ? Hemorrhoids   ? HFrEF (heart failure with reduced ejection fraction) ([place])   ? a. 09/2016 Echo: EF 55-60%, no rwma, triv AI, mild MR, mildly dil LA. -> as of April 2020: EF 40 to 45%.  Septal dyssynergy/hypokinesis due to postop state but otherwise unable to assess wall motion [name] due to poor study.  ? History of myocardial infarct at age less than 16 years   ? Hyperlipidemia   ? Hypertension   ? Kidney stones   ?  going to [facility]  June 26 to see kidney specialist  ? Lightheadedness   ? Mass on back   ? Nephrolithiasis   ? Orthostatic hypotension   ? a. Improved after discontinuation of metoprolol.  ? OSA (obstructive sleep apnea)   ?  a. On CPAP.  ? Peyronie disease   ? Pulmonary embolism ([place]) 01/2006  ? Post-op/treated  ? Skin lesions, generalized   ? Stroke [facility]) 1993  ? right side weakness no blood thinners   on Aspirin  ? Syncope and collapse   ? TIA (transient ischemic attack) 08/13/2014  ? Urticaria   ? ? ?Review of systems:  Otherwise negative.  ? ? ?Physical Exam ? ?Gen: Alert, oriented. Appears stated age.  ?HEENT: PERRLA. ?Lungs: No respiratory distress ?CV: RRR ?Abd: soft, benign, no masses ?Ext: No edema ? ? ? ?Planned procedures: Proceed with colonoscopy. The patient understands the nature of the planned procedure, indications, risks, alternatives and potential complications including but not limited to bleeding, infection, perforation, damage to internal organs and possible oversedation/side effects from anesthesia. The patient agrees and gives consent to proceed.  ?Please refer to procedure notes for findings, recommendations and patient disposition/instructions.  ? ? ? ?Lesly Rubenstein, MD ?[facility] ? ? ? ?  ? ?

## 2021-07-30 NOTE — Anesthesia Postprocedure Evaluation (Signed)
Anesthesia Post Note ? ?Patient: Willie Clark ? ?Procedure(s) Performed: COLONOSCOPY WITH PROPOFOL ? ?Patient location during evaluation: PACU ?Anesthesia Type: General ?Level of consciousness: awake and alert, oriented and patient cooperative ?Pain management: pain level controlled ?Vital Signs Assessment: post-procedure vital signs reviewed and stable ?Respiratory status: spontaneous breathing, nonlabored ventilation and respiratory function stable ?Cardiovascular status: blood pressure returned to baseline and stable ?Postop Assessment: adequate PO intake ?Anesthetic complications: no ? ? ?No notable events documented. ? ? ?Last Vitals:  ?Vitals:  ? 07/30/21 1240 07/30/21 1250  ?BP: 105/80 104/60  ?Pulse: 63 60  ?Resp: 15 20  ?Temp:  (!) 35.7 ?C  ?SpO2: 100% 99%  ?  ?Last Pain:  ?Vitals:  ? 07/30/21 1250  ?TempSrc: Temporal  ?PainSc: 0-No pain  ? ? ?  ?  ?  ?  ?  ?  ? ?Darrin Nipper ? ? ? ? ?

## 2021-07-31 ENCOUNTER — Encounter: Payer: Self-pay | Admitting: Gastroenterology

## 2021-07-31 ENCOUNTER — Other Ambulatory Visit: Payer: Self-pay | Admitting: Cardiovascular Disease

## 2021-07-31 LAB — SURGICAL PATHOLOGY

## 2021-07-31 NOTE — Telephone Encounter (Signed)
Per Dr. Fletcher Anon, patient will need to continue dual anti-platelet therapy with clopidogrel and aspirin until 12 months post coronary intervention on 11/15/20.  ? ?Routing to [facility] for review. If they choose to proceed, we will conduct a virtual visit for medical clearance.  ?

## 2021-08-01 ENCOUNTER — Encounter: Payer: Medicare HMO | Admitting: *Deleted

## 2021-08-01 DIAGNOSIS — I5022 Chronic systolic (congestive) heart failure: Secondary | ICD-10-CM | POA: Diagnosis not present

## 2021-08-01 NOTE — Progress Notes (Signed)
Daily Session Note ? ?Patient Details  ?Name: ROBYN NOHR ?MRN: [id number] ?Date of Birth: 03/20/1951 ?Referring Provider:   ?Flowsheet Row Pulmonary Rehab from 05/23/2021 in [facility] Cardiac and Pulmonary Rehab  ?Referring Provider [name] MD  ? ?  ? ? ?Encounter Date: 08/01/2021 ? ?Check In: ? Session Check In - 08/01/21 1001   ? ?  ? Check-In  ? Supervising physician immediately available to respond to emergencies See telemetry face sheet for immediately available ER MD   ? Location [facility]-Cardiac & Pulmonary Rehab   ? Staff Present Hope Budds, RDN, LDN;Amanda Sommer, BA, ACSM CEP, Exercise Physiologist;Nylah Butkus, RN, BSN, CCRP   ? Virtual Visit No   ? Medication changes reported     No   ? Fall or balance concerns reported    No   ? Warm-up and Cool-down Performed on first and last piece of equipment   ? Resistance Training Performed Yes   ? VAD Patient? No   ? PAD/SET Patient? No   ?  ? Pain Assessment  ? Currently in Pain? No/denies   ? ?  ?  ? ?  ? ? ? ? ? ?Social History  ? ?Tobacco Use  ?Smoking Status Former  ? Packs/day: 2.50  ? Types: Cigarettes  ? Quit date: 08/21/1991  ? Years since quitting: 29.9  ?Smokeless Tobacco Never  ? ? ?Goals Met:  ?Proper associated with RPD/PD & O2 Sat ?Independence with exercise equipment ?Exercise tolerated well ?No report of concerns or symptoms today ? ?Goals Unmet:  ?Not Applicable ? ?Comments: Pt able to follow exercise prescription today without complaint.  Will continue to monitor for progression. ? ? ? ?Dr. Emily Filbert is Medical Director for [place].  ?Dr. Ottie Glazier is Medical Director for [facility] Pulmonary Rehabilitation. ?

## 2021-08-03 ENCOUNTER — Encounter: Payer: Medicare HMO | Admitting: *Deleted

## 2021-08-03 DIAGNOSIS — I5022 Chronic systolic (congestive) heart failure: Secondary | ICD-10-CM

## 2021-08-03 NOTE — Progress Notes (Signed)
Daily Session Note ? ?Patient Details  ?Name: Willie Clark ?MRN: [id number] ?Date of Birth: Feb 24, 1951 ?Referring Provider:   ?Flowsheet Row Pulmonary Rehab from 05/23/2021 in [facility] Cardiac and Pulmonary Rehab  ?Referring Provider [name] MD  ? ?  ? ? ?Encounter Date: 08/03/2021 ? ?Check In: ? Session Check In - 08/03/21 0926   ? ?  ? Check-In  ? Supervising physician immediately available to respond to emergencies See telemetry face sheet for immediately available ER MD   ? Location [facility]-Cardiac & Pulmonary Rehab   ? Staff Present Renita Papa, RN BSN;Joseph Ferney, RCP,RRT,BSRT;Melissa Primrose, [place], LDN   ? Virtual Visit No   ? Medication changes reported     No   ? Fall or balance concerns reported    No   ? Warm-up and Cool-down Performed on first and last piece of equipment   ? Resistance Training Performed Yes   ? VAD Patient? No   ? PAD/SET Patient? No   ?  ? Pain Assessment  ? Currently in Pain? No/denies   ? ?  ?  ? ?  ? ? ? ? ? ?Social History  ? ?Tobacco Use  ?Smoking Status Former  ? Packs/day: 2.50  ? Types: Cigarettes  ? Quit date: 08/21/1991  ? Years since quitting: 29.9  ?Smokeless Tobacco Never  ? ? ?Goals Met:  ?Independence with exercise equipment ?Exercise tolerated well ?No report of concerns or symptoms today ?Strength training completed today ? ?Goals Unmet:  ?Not Applicable ? ?Comments: Pt able to follow exercise prescription today without complaint.  Will continue to monitor for progression. ? ? ? ?Dr. Emily Filbert is Medical Director for [place].  ?Dr. Ottie Glazier is Medical Director for [facility] Pulmonary Rehabilitation. ?

## 2021-08-06 ENCOUNTER — Encounter: Payer: Medicare HMO | Admitting: *Deleted

## 2021-08-06 DIAGNOSIS — I5022 Chronic systolic (congestive) heart failure: Secondary | ICD-10-CM

## 2021-08-06 NOTE — Telephone Encounter (Addendum)
Received a call from the patient stating he did not cancel his injections. He states the [facility] called and told him Dr Fletcher Anon cleared him to have in injection and to stop his Plavix 8 days prior. Then he said he got a call the next day from that same office stating his injection was canceled. He states he is going to contact [facility] and see what is going and get his injection rescheduled.  ? ?Gave patient next appointment date and time. He states his appointment was scheduled on 4/25 at 9:20 am and wanted to know who moved his appointment. I advised the patient that I was not aware of who moved his appointment. He voiced understanding and thanked me for taking his call.  ?

## 2021-08-06 NOTE — Telephone Encounter (Signed)
? ?  Patient Name: Willie Clark  ?DOB: 1950-06-25 ?MRN: [id number] ? ?Primary Cardiologist: [name], MD ? ?Chart reviewed as part of pre-operative protocol coverage. Will route to callback team to reach out to [facility] to determine if procedure is felt urgent/absolutely necessary. If not, I would suggest keeping follow-up as scheduled with Dr. Fletcher Anon 09/11/21 at which time finalized plans for clearance for holding Plavix/proceeding with injection can be finalized. Please let preop APP pool know what they say. Thanks! ? ?[name], PA-C ?08/06/2021, 8:14 AM ? ? ?

## 2021-08-06 NOTE — Progress Notes (Signed)
Daily Session Note ? ?Patient Details  ?Name: Willie Clark ?MRN: [id number] ?Date of Birth: 03-06-1951 ?Referring Provider:   ?Flowsheet Row Pulmonary Rehab from 05/23/2021 in [facility] Cardiac and Pulmonary Rehab  ?Referring Provider [name] MD  ? ?  ? ? ?Encounter Date: 08/06/2021 ? ?Check In: ? Session Check In - 08/06/21 0926   ? ?  ? Check-In  ? Supervising physician immediately available to respond to emergencies See telemetry face sheet for immediately available ER MD   ? Location [facility]-Cardiac & Pulmonary Rehab   ? Staff Present Earlean Shawl, BS, ACSM CEP, Exercise Physiologist;Joseph [place], [place];Heath Lark, RN, BSN, CCRP   ? Virtual Visit No   ? Medication changes reported     No   ? Fall or balance concerns reported    No   ? Warm-up and Cool-down Performed on first and last piece of equipment   ? Resistance Training Performed Yes   ? VAD Patient? No   ? PAD/SET Patient? No   ?  ? Pain Assessment  ? Currently in Pain? No/denies   ? ?  ?  ? ?  ? ? ? ? ? ?Social History  ? ?Tobacco Use  ?Smoking Status Former  ? Packs/day: 2.50  ? Types: Cigarettes  ? Quit date: 08/21/1991  ? Years since quitting: 29.9  ?Smokeless Tobacco Never  ? ? ?Goals Met:  ?Independence with exercise equipment ?Exercise tolerated well ?No report of concerns or symptoms today ? ?Goals Unmet:  ?Not Applicable ? ?Comments: Pt able to follow exercise prescription today without complaint.  Will continue to monitor for progression. ? ? ? ?Dr. Emily Filbert is Medical Director for [place].  ?Dr. Ottie Glazier is Medical Director for [facility] Pulmonary Rehabilitation. ?

## 2021-08-06 NOTE — Telephone Encounter (Signed)
[facility] and spoke with the receptionist who informed me that the patient has canceled his procedure.  ? ?Contacted the patient but no answer. Left message informing him that his cardiac clearance does not need to be addressed at his next office visit with Dr Fletcher Anon and if he didn't need to be seen at that time then he could reschedule the appointment. Advised a call back with any questions or concerns.  ?

## 2021-08-07 ENCOUNTER — Encounter: Payer: Self-pay | Admitting: Dermatology

## 2021-08-07 ENCOUNTER — Ambulatory Visit: Payer: Medicare HMO | Admitting: Dermatology

## 2021-08-07 DIAGNOSIS — L814 Other melanin hyperpigmentation: Secondary | ICD-10-CM | POA: Diagnosis not present

## 2021-08-07 DIAGNOSIS — L578 Other skin changes due to chronic exposure to nonionizing radiation: Secondary | ICD-10-CM

## 2021-08-07 DIAGNOSIS — L820 Inflamed seborrheic keratosis: Secondary | ICD-10-CM | POA: Diagnosis not present

## 2021-08-07 DIAGNOSIS — L821 Other seborrheic keratosis: Secondary | ICD-10-CM

## 2021-08-07 NOTE — Progress Notes (Signed)
? ?  Follow-Up Visit ?  ?Subjective  ?Willie Clark is a 71 y.o. male who presents for the following: moles (Head, torso, pubic area. Increasing in number, itching. Brown, spreading). Bothersome to patient. ? ? ? ?The following portions of the chart were reviewed this encounter and updated as appropriate:   ?  ? ?Review of Systems: No other skin or systemic complaints except as noted in HPI or Assessment and Plan. ? ? ?Objective  ?Well appearing patient in no apparent distress; mood and affect are within normal limits. ? ?All skin waist up examined. ? ?back x8, left groin x1, left temporal hairline x2, right upper arm x1, chest x4, left lower flank x1, left malar cheek x1 (18) ?Erythematous keratotic or waxy stuck-on papule or plaque. ? ? ?Assessment & Plan  ?Inflamed seborrheic keratosis (18) ?back x8, left groin x1, left temporal hairline x2, right upper arm x1, chest x4, left lower flank x1, left malar cheek x1 ? ?Destruction of lesion - back x8, left groin x1, left temporal hairline x2, right upper arm x1, chest x4, left lower flank x1, left malar cheek x1 ? ?Destruction method: cryotherapy   ?Informed consent: discussed and consent obtained   ?Lesion destroyed using liquid nitrogen: Yes   ?Region frozen until ice ball extended beyond lesion: Yes   ?Outcome: patient tolerated procedure well with no complications   ?Post-procedure details: wound care instructions given   ?Additional details:  Prior to procedure, discussed risks of blister formation, small wound, skin dyspigmentation, or rare scar following cryotherapy. Recommend Vaseline ointment to treated areas while healing.  ? ? ?Actinic Damage ?- chronic, secondary to cumulative UV radiation exposure/sun exposure over time ?- diffuse scaly erythematous macules with underlying dyspigmentation ?- Recommend daily broad spectrum sunscreen SPF 30+ to sun-exposed areas, reapply every 2 hours as needed.  ?- Recommend staying in the shade or wearing long sleeves, sun  glasses (UVA+UVB protection) and wide brim hats (4-inch brim around the entire circumference of the hat). ?- Call for new or changing lesions. ? ?Seborrheic Keratoses ?- Stuck-on, waxy, tan-brown papules and/or plaques  ?- Benign-appearing ?- Discussed benign etiology and prognosis. ?- Observe ?- Call for any changes ? ?Lentigines ?- Scattered tan macules ?- Due to sun exposure ?- Benign-appering, observe ?- Recommend daily broad spectrum sunscreen SPF 30+ to sun-exposed areas, reapply every 2 hours as needed. ?- Call for any changes  ? ? ?Return if symptoms worsen or fail to improve. ? ?I, Emelia Salisbury, CMA, am acting as scribe for Brendolyn Patty, MD. ? ?Documentation: I have reviewed the above documentation for accuracy and completeness, and I agree with the above. ? ?Brendolyn Patty MD  ? ?

## 2021-08-07 NOTE — Patient Instructions (Addendum)
Cryotherapy Aftercare ? ?Wash gently with soap and water everyday.   ?Apply Vaseline and Band-Aid daily until healed.  ? ?Prior to procedure, discussed risks of blister formation, small wound, skin dyspigmentation, or rare scar following cryotherapy. Recommend Vaseline ointment to treated areas while healing.  ? ? ?Seborrheic Keratosis ? ?What causes seborrheic keratoses? ?Seborrheic keratoses are harmless, common skin growths that first appear during adult life.  As time goes by, more growths appear.  Some people may develop a large number of them.  Seborrheic keratoses appear on both covered and uncovered body parts.  They are not caused by sunlight.  The tendency to develop seborrheic keratoses can be inherited.  They vary in color from skin-colored to gray, brown, or even black.  They can be either smooth or have a rough, warty surface.   ?Seborrheic keratoses are superficial and look as if they were stuck on the skin.  Under the microscope this type of keratosis looks like layers upon layers of skin.  That is why at times the top layer may seem to fall off, but the rest of the growth remains and re-grows.   ? ?Treatment ?Seborrheic keratoses do not need to be treated, but can easily be removed in the office.  Seborrheic keratoses often cause symptoms when they rub on clothing or jewelry.  Lesions can be in the way of shaving.  If they become inflamed, they can cause itching, soreness, or burning.  Removal of a seborrheic keratosis can be accomplished by freezing, burning, or surgery. ?If any spot bleeds, scabs, or grows rapidly, please return to have it checked, as these can be an indication of a skin cancer. ? ? ?If You Need Anything After Your Visit ? ?If you have any questions or concerns for your doctor, please call our main line at [phone] and press option 4 to reach your doctor's medical assistant. If no one answers, please leave a voicemail as directed and we will return your call as soon as  possible. Messages left after 4 pm will be answered the following business day.  ? ?You may also send us a message via MyChart. We typically respond to MyChart messages within 1-2 business days. ? ?For prescription refills, please ask your pharmacy to contact our office. Our fax number is [phone]. ? ?If you have an urgent issue when the clinic is closed that cannot wait until the next business day, you can page your doctor at the number below.   ? ?Please note that while we do our best to be available for urgent issues outside of office hours, we are not available 24/7.  ? ?If you have an urgent issue and are unable to reach us, you may choose to seek medical care at your doctor's office, retail clinic, urgent care center, or emergency room. ? ?If you have a medical emergency, please immediately call 911 or go to the emergency department. ? ?Pager Numbers ? ?- Dr. Kowalski: [phone] ? ?- Dr. Moye: [phone] ? ?- Dr. Stewart: [phone] ? ?In the event of inclement weather, please call our main line at [phone] for an update on the status of any delays or closures. ? ?Dermatology Medication Tips: ?Please keep the boxes that topical medications come in in order to help keep track of the instructions about where and how to use these. Pharmacies typically print the medication instructions only on the boxes and not directly on the medication tubes.  ? ?If your medication is too expensive, please contact our office   at [phone] option 4 or send us a message through MyChart.  ? ?We are unable to tell what your co-pay for medications will be in advance as this is different depending on your insurance coverage. However, we may be able to find a substitute medication at lower cost or fill out paperwork to get insurance to cover a needed medication.  ? ?If a prior authorization is required to get your medication covered by your insurance company, please allow us 1-2 business days to complete this  process. ? ?Drug prices often vary depending on where the prescription is filled and some pharmacies may offer cheaper prices. ? ?The website www.goodrx.com contains coupons for medications through different pharmacies. The prices here do not account for what the cost may be with help from insurance (it may be cheaper with your insurance), but the website can give you the price if you did not use any insurance.  ?- You can print the associated coupon and take it with your prescription to the pharmacy.  ?- You may also stop by our office during regular business hours and pick up a GoodRx coupon card.  ?- If you need your prescription sent electronically to a different pharmacy, notify our office through [facility] MyChart or by phone at [phone] option 4. ? ? ? ? ?Si Usted Necesita Algo Despu?s de Su Visita ? ?Tambi?n puede enviarnos un mensaje a trav?s de MyChart. Por lo general respondemos a los mensajes de MyChart en el transcurso de 1 a 2 d?as h?biles. ? ?Para renovar recetas, por favor pida a su farmacia que se ponga en contacto con nuestra oficina. Nuestro n?mero de fax es el [phone]. ? ?Si tiene un asunto urgente cuando la cl?nica est? cerrada y que no puede esperar hasta el siguiente d?a h?bil, puede llamar/localizar a su doctor(a) al n?mero que aparece a continuaci?n.  ? ?Por favor, tenga en cuenta que aunque hacemos todo lo posible para estar disponibles para asuntos urgentes fuera del horario de oficina, no estamos disponibles las 24 horas del d?a, los 7 d?as de la semana.  ? ?Si tiene un problema urgente y no puede comunicarse con nosotros, puede optar por buscar atenci?n m?dica  en el consultorio de su doctor(a), en una cl?nica privada, en un centro de atenci?n urgente o en una sala de emergencias. ? ?Si tiene una emergencia m?dica, por favor llame inmediatamente al 911 o vaya a la sala de emergencias. ? ?N?meros de b?per ? ?- Dr. Kowalski: [phone] ? ?- Dra. Moye: [phone] ? ?- Dra.  Stewart: [phone] ? ?En caso de inclemencias del tiempo, por favor llame a nuestra l?nea principal al [phone] para una actualizaci?n sobre el estado de cualquier retraso o cierre. ? ?Consejos para la medicaci?n en dermatolog?a: ?Por favor, guarde las cajas en las que vienen los medicamentos de uso t?pico para ayudarle a seguir las instrucciones sobre d?nde y c?mo usarlos. Las farmacias generalmente imprimen las instrucciones del medicamento s?lo en las cajas y no directamente en los tubos del medicamento.  ? ?Si su medicamento es muy caro, por favor, p?ngase en contacto con nuestra oficina llamando al [phone] y presione la opci?n 4 o env?enos un mensaje a trav?s de MyChart.  ? ?No podemos decirle cu?l ser? su copago por los medicamentos por adelantado ya que esto es diferente dependiendo de la cobertura de su seguro. Sin embargo, es posible que podamos encontrar un medicamento sustituto a menor costo o llenar un formulario para que el seguro cubra el medicamento que se   considera necesario.  ? ?Si se requiere una autorizaci?n previa para que su compa??a de seguros cubra su medicamento, por favor perm?tanos de 1 a 2 d?as h?biles para completar este proceso. ? ?Los precios de los medicamentos var?an con frecuencia dependiendo del lugar de d?nde se surte la receta y alguna farmacias pueden ofrecer precios m?s baratos. ? ?El sitio web www.goodrx.com tiene cupones para medicamentos de diferentes farmacias. Los precios aqu? no tienen en cuenta lo que podr?a costar con la ayuda del seguro (puede ser m?s barato con su seguro), pero el sitio web puede darle el precio si no utiliz? ning?n seguro.  ?- Puede imprimir el cup?n correspondiente y llevarlo con su receta a la farmacia.  ?- Tambi?n puede pasar por nuestra oficina durante el horario de atenci?n regular y recoger una tarjeta de cupones de GoodRx.  ?- Si necesita que su receta se env?e electr?nicamente a una farmacia diferente, informe a nuestra oficina a  trav?s de MyChart de [place] o por tel?fono llamando al [phone] y presione la opci?n 4.  ?

## 2021-08-08 ENCOUNTER — Encounter: Payer: Self-pay | Admitting: *Deleted

## 2021-08-08 DIAGNOSIS — I5022 Chronic systolic (congestive) heart failure: Secondary | ICD-10-CM | POA: Diagnosis not present

## 2021-08-08 NOTE — Progress Notes (Signed)
Pulmonary Individual Treatment Plan ? ?Patient Details  ?Name: Willie Clark ?MRN: [id number] ?Date of Birth: 11/01/1950 ?Referring Provider:   ?Flowsheet Row Pulmonary Rehab from 05/23/2021 in [facility] Cardiac and Pulmonary Rehab  ?Referring Provider [name] MD  ? ?  ? ? ?Initial Encounter Date:  ?Flowsheet Row Pulmonary Rehab from 05/23/2021 in [facility] Cardiac and Pulmonary Rehab  ?Date 05/23/21  ? ?  ? ? ?Visit Diagnosis: Heart failure, chronic systolic (HCC) ? ?Patient's Home Medications on Admission: ? ?Current Outpatient Medications:  ?  albuterol (PROVENTIL HFA;VENTOLIN HFA) 108 (90 Base) MCG/ACT inhaler, Inhale into the lungs every 4 (four) hours as needed for wheezing or shortness of breath., Disp: , Rfl:  ?  aspirin EC 81 MG EC tablet, Take 1 tablet (81 mg total) by mouth daily. Swallow whole., Disp: 30 tablet, Rfl: 11 ?  carvedilol (COREG) 3.125 MG tablet, TAKE 1 TABLET (3.125 MG TOTAL) BY MOUTH 2 (TWO) TIMES DAILY WITH A MEAL., Disp: 180 tablet, Rfl: 0 ?  citalopram (CELEXA) 20 MG tablet, Take 20 mg by mouth daily. , Disp: , Rfl:  ?  clopidogrel (PLAVIX) 75 MG tablet, TAKE (1) TABLET 75 MG DAILY., Disp: 90 tablet, Rfl: 3 ?  dicyclomine (BENTYL) 10 MG capsule, TAKE 1 CAPSULE THREE TIMES DAILY AS NEEDED, Disp: , Rfl:  ?  docusate calcium (SURFAK) 240 MG capsule, Take 240 mg by mouth daily., Disp: , Rfl:  ?  empagliflozin (JARDIANCE) 10 MG TABS tablet, Take 1 tablet (10 mg total) by mouth daily before breakfast., Disp: 90 tablet, Rfl: 3 ?  EPINEPHrine 0.3 mg/0.3 mL IJ SOAJ injection, Inject 0.3 mg into the muscle as needed. , Disp: , Rfl:  ?  fexofenadine (ALLEGRA) 180 MG tablet, Take 180 mg by mouth daily., Disp: , Rfl:  ?  finasteride (PROSCAR) 5 MG tablet, Take 5 mg by mouth daily., Disp: , Rfl:  ?  gabapentin (NEURONTIN) 800 MG tablet, Take 1 tablet by mouth 3 (three) times daily., Disp: , Rfl:  ?  isosorbide mononitrate (IMDUR) 60 MG 24 hr tablet, Take 60 mg by mouth daily., Disp: , Rfl:  ?  losartan  (COZAAR) 25 MG tablet, Take 25 mg by mouth daily., Disp: , Rfl:  ?  mesalamine (APRISO) 0.375 g 24 hr capsule, Take 1.4 mg by mouth daily., Disp: , Rfl:  ?  Multiple Vitamins-Minerals (HAIR SKIN AND NAILS FORMULA PO), Take 1 capsule by mouth daily., Disp: , Rfl:  ?  NALTREXONE HCL PO, Take 2.5 mg by mouth daily., Disp: , Rfl:  ?  nitroGLYCERIN (NITROSTAT) 0.4 MG SL tablet, Place 1 tablet (0.4 mg total) under the tongue every 5 (five) minutes as needed for chest pain., Disp: 90 tablet, Rfl: 1 ?  nystatin-triamcinolone ointment (MYCOLOG), Apply 1 application topically 2 (two) times daily., Disp: 30 g, Rfl: 0 ?  ondansetron (ZOFRAN) 4 MG tablet, Take 4 mg by mouth every 8 (eight) hours as needed for nausea or vomiting., Disp: , Rfl:  ?  oxyCODONE (OXY IR/ROXICODONE) 5 MG immediate release tablet, 0.5-1 mg every 6 (six) hours as needed., Disp: , Rfl:  ?  pantoprazole (PROTONIX) 40 MG tablet, Take 40 mg by mouth 2 (two) times daily. , Disp: , Rfl:  ?  Peppermint Oil (IBGARD PO), Take by mouth daily at 2 am., Disp: , Rfl:  ?  potassium chloride SA (KLOR-CON M20) 20 MEQ tablet, Take 1 tablet (20 mEq total) by mouth daily., Disp: 90 tablet, Rfl: 0 ?  Probiotic Product (PROBIOTIC  PO), Take by mouth daily at 8 pm., Disp: , Rfl:  ?  rOPINIRole (REQUIP) 1 MG tablet, Take 1 mg by mouth 2 (two) times daily., Disp: , Rfl:  ?  rosuvastatin (CRESTOR) 40 MG tablet, TAKE 1 TABLET EVERY DAY ( DOSE INCREASE ), Disp: 90 tablet, Rfl: 0 ?  spironolactone (ALDACTONE) 25 MG tablet, TAKE 1 TABLET EVERY DAY, Disp: 90 tablet, Rfl: 1 ?  torsemide (DEMADEX) 20 MG tablet, Take 2 tablets (40 mg total) by mouth daily., Disp: 180 tablet, Rfl: 2 ?  traMADol (ULTRAM) 50 MG tablet, Take 50 mg by mouth every 6 (six) hours as needed. (Patient not taking: Reported on 07/26/2021), Disp: , Rfl:  ?  VASCEPA 1 g capsule, TAKE 2 CAPSULES TWICE DAILY, Disp: 360 capsule, Rfl: 1 ?  vitamin B-12 (CYANOCOBALAMIN) 1000 MCG tablet, Take 1,000 mcg by mouth daily., Disp:  , Rfl:  ? ?Past Medical History: ?Past Medical History:  ?Diagnosis Date  ? Abnormal stress test   ? Anxiety   ? Arthritis   ? Barrett's esophagus   ? Bowel obstruction ([place]) 02/2009  ? small bowel  ? CHF (congestive heart failure) ([place])   ? Chronic chest pain   ? Coronary artery disease   ? a. 1993 CABG; b. 2007 Redo CABG; c. 09/2015 Cath: LM mild dzs, LAD 100ost, RI mild dzs, LCX 60p, OM1 100, RCA 100ost, VG->OM2 min irregs, LIMA->LAD nl, VG->RPDA 30ost, 20d, nl EF->Med rx.  ? Depression   ? Diverticulitis   ? Diverticulosis   ? Erectile dysfunction   ? Fibromyalgia   ? Gastric ulcer   ? Gastritis   ? GERD (gastroesophageal reflux disease)   ? Headache   ? Hemorrhoids   ? HFrEF (heart failure with reduced ejection fraction) ([place])   ? a. 09/2016 Echo: EF 55-60%, no rwma, triv AI, mild MR, mildly dil LA. -> as of April 2020: EF 40 to 45%.  Septal dyssynergy/hypokinesis due to postop state but otherwise unable to assess wall motion [name] due to poor study.  ? History of myocardial infarct at age less than 86 years   ? Hyperlipidemia   ? Hypertension   ? Kidney stones   ? going to [facility]  June 26 to see kidney specialist  ? Lightheadedness   ? Mass on back   ? Nephrolithiasis   ? Orthostatic hypotension   ? a. Improved after discontinuation of metoprolol.  ? OSA (obstructive sleep apnea)   ? a. On CPAP.  ? Peyronie disease   ? Pulmonary embolism ([place]) 01/2006  ? Post-op/treated  ? Skin lesions, generalized   ? Stroke [facility]) 1993  ? right side weakness no blood thinners   on Aspirin  ? Syncope and collapse   ? TIA (transient ischemic attack) 08/13/2014  ? Urticaria   ? ? ?Tobacco Use: ?Social History  ? ?Tobacco Use  ?Smoking Status Former  ? Packs/day: 2.50  ? Types: Cigarettes  ? Quit date: 08/21/1991  ? Years since quitting: 29.9  ?Smokeless Tobacco Never  ? ? ?Labs: ?Review Flowsheet   ? ?  ?  Latest Ref Rng & Units 12/11/2019 12/13/2019 02/07/2020 11/14/2020  ?Labs for [facility]P Cardiac and Pulmonary Rehab  ?Cholestrol  0 - 200 mg/dL  181   165     ?LDL (calc) 0 - 99 mg/dL  84   61     ?HDL-C >40 mg/dL  35   41     ?Trlycerides <150 mg/dL  312  317     ?Hemoglobin A1c 4.8 - 5.6 % 6.0     6.1    ?Bicarbonate 20.0 - 28.0 mmol/L      ?O2 Saturation %      ? ?  06/01/2021  ?Labs for ITP Cardiac and Pulmonary Rehab  ?Cholestrol   ?LDL (calc)   ?HDL-C   ?Trlycerides   ?Hemoglobin A1c   ?Bicarbonate 32.3    ?O2 Saturation 71.3    ?  ? ? Multiple values from one day are sorted in reverse-chronological order  ?  ?  ? ? ? ?Pulmonary Assessment Scores: ? Pulmonary Assessment Scores   ? ? [place] Name 05/23/21 1450  ?  ?  ?  ? ADL UCSD  ? ADL Phase Entry    ? SOB Score total 40    ? Rest 1    ? Walk 3    ? Stairs 3    ? Bath 2    ? Dress 2    ? Shop 0    ?  ? CAT Score  ? CAT Score 13    ?  ? mMRC Score  ? mMRC Score 1    ? ?  ?  ? ?  ?  ?UCSD: ?Self-administered rating of dyspnea associated with activities of daily living (ADLs) ?6-point scale (0 = "not at all" to 5 = "maximal or unable to do because of breathlessness")  ?Scoring Scores range from 0 to 120.  Minimally important difference is 5 units ? ?CAT: ?CAT can identify the health impairment of COPD patients and is better correlated with disease progression.  ?CAT has a scoring range of zero to 40. The CAT score is classified into four groups of low (less than 10), medium (10 - 20), high (21-30) and very high (31-40) based on the impact level of disease on health status. A CAT score over 10 suggests significant symptoms.  A worsening CAT score could be explained by an exacerbation, poor medication adherence, poor inhaler technique, or progression of COPD or comorbid conditions.  ?CAT MCID is 2 points ? ?mMRC: ?mMRC (Modified Medical Research Council) Dyspnea Scale is used to assess the degree of baseline functional disability in patients of respiratory disease due to dyspnea. ?No minimal important difference is established. A decrease in score of 1 point or greater is considered a positive  change.  ? ?Pulmonary Function Assessment: ? ? ?Exercise Target Goals: ?Exercise Program Goal: ?Individual exercise prescription set using results from initial 6 min walk test and THRR while considering  pat

## 2021-08-08 NOTE — Progress Notes (Signed)
Daily Session Note ? ?Patient Details  ?Name: Willie Clark ?MRN: [id number] ?Date of Birth: [birth year] ?Referring Provider:   ?Flowsheet Row Pulmonary Rehab from 05/23/2021 in [facility] Cardiac and Pulmonary Rehab  ?Referring Provider [name] MD  ? ?  ? ? ?Encounter Date: 08/08/2021 ? ?Check In: ? Session Check In - 08/08/21 0927   ? ?  ? Check-In  ? Supervising physician immediately available to respond to emergencies See telemetry face sheet for immediately available ER MD   ? Location [facility]-Cardiac & Pulmonary Rehab   ? Staff Present Birdie Sons, MPA, RN;Amanda Sommer, BA, ACSM CEP, Exercise Physiologist;Joseph [place], [place]   ? Virtual Visit No   ? Medication changes reported     No   ? Fall or balance concerns reported    No   ? Tobacco Cessation No Change   ? Warm-up and Cool-down Performed on first and last piece of equipment   ? Resistance Training Performed Yes   ? VAD Patient? No   ? PAD/SET Patient? No   ?  ? Pain Assessment  ? Currently in Pain? No/denies   ? ?  ?  ? ?  ? ? ? ? ? ?Social History  ? ?Tobacco Use  ?Smoking Status Former  ? Packs/day: 2.50  ? Types: Cigarettes  ? Quit date: 08/21/1991  ? Years since quitting: 29.9  ?Smokeless Tobacco Never  ? ? ?Goals Met:  ?Independence with exercise equipment ?Exercise tolerated well ?No report of concerns or symptoms today ?Strength training completed today ? ?Goals Unmet:  ?Not Applicable ? ?Comments: Pt able to follow exercise prescription today without complaint.  Will continue to monitor for progression. ? ? ? ?Dr. Emily Filbert is Medical Director for [place].  ?Dr. Ottie Glazier is Medical Director for [facility] Pulmonary Rehabilitation. ?

## 2021-08-09 NOTE — Telephone Encounter (Addendum)
Callback, see my note 08/06/21, need to clarify urgency from [facility]. It looks like they mentioned patient cancelled injection (see Tee's note's) but just need to find out if this is something that can be deferred until patient is seen in follow-up end of May with Dr. Fletcher Anon (patient is still within 12 month window for recent PCI from 10/2020). If he latter, we can update the appt notes so Dr. Fletcher Anon is aware to review at upcoming visit. ?

## 2021-08-09 NOTE — Telephone Encounter (Signed)
Per pre op provider Melina Copa, PAC I called and left message for the requesting office to clarify if procedure is urgent; see all previous notes to this clearance. Left message to call back and let us know if urgent or if ok to wait until the pt has seen Dr. Fletcher Anon 09/11/21.  ?

## 2021-08-10 ENCOUNTER — Encounter: Payer: Medicare HMO | Admitting: *Deleted

## 2021-08-10 DIAGNOSIS — I5022 Chronic systolic (congestive) heart failure: Secondary | ICD-10-CM | POA: Diagnosis not present

## 2021-08-10 NOTE — Progress Notes (Signed)
Daily Session Note ? ?Patient Details  ?Name: Willie Clark ?MRN: [id number] ?Date of Birth: [birth year] ?Referring Provider:   ?Flowsheet Row Pulmonary Rehab from 05/23/2021 in [facility] Cardiac and Pulmonary Rehab  ?Referring Provider [name] MD  ? ?  ? ? ?Encounter Date: 08/10/2021 ? ?Check In: ? Session Check In - 08/10/21 0925   ? ?  ? Check-In  ? Supervising physician immediately available to respond to emergencies See telemetry face sheet for immediately available ER MD   ? Location [facility]-Cardiac & Pulmonary Rehab   ? Staff Present Renita Papa, RN BSN;Joseph [place], RCP,RRT,BSRT;Jessica [place], [place], [place], [place], CCET   ? Virtual Visit No   ? Medication changes reported     No   ? Fall or balance concerns reported    No   ? Warm-up and Cool-down Performed on first and last piece of equipment   ? Resistance Training Performed Yes   ? VAD Patient? No   ? PAD/SET Patient? No   ?  ? Pain Assessment  ? Currently in Pain? No/denies   ? ?  ?  ? ?  ? ? ? ? ? ?Social History  ? ?Tobacco Use  ?Smoking Status Former  ? Packs/day: 2.50  ? Types: Cigarettes  ? Quit date: 08/21/1991  ? Years since quitting: 29.9  ?Smokeless Tobacco Never  ? ? ?Goals Met:  ?Independence with exercise equipment ?Exercise tolerated well ?No report of concerns or symptoms today ?Strength training completed today ? ?Goals Unmet:  ?Not Applicable ? ?Comments: Pt able to follow exercise prescription today without complaint.  Will continue to monitor for progression. ? ? ? ?Dr. Emily Filbert is Medical Director for [place].  ?Dr. Ottie Glazier is Medical Director for [facility] Pulmonary Rehabilitation. ?

## 2021-08-13 ENCOUNTER — Encounter: Payer: Medicare HMO | Admitting: *Deleted

## 2021-08-13 DIAGNOSIS — I5022 Chronic systolic (congestive) heart failure: Secondary | ICD-10-CM | POA: Diagnosis not present

## 2021-08-13 NOTE — Progress Notes (Signed)
Daily Session Note ? ?Patient Details  ?Name: Willie Clark ?MRN: [id number] ?Date of Birth: 1950/12/04 ?Referring Provider:   ?Flowsheet Row Pulmonary Rehab from 05/23/2021 in [facility] Cardiac and Pulmonary Rehab  ?Referring Provider [name] MD  ? ?  ? ? ?Encounter Date: 08/13/2021 ? ?Check In: ? Session Check In - 08/13/21 0923   ? ?  ? Check-In  ? Supervising physician immediately available to respond to emergencies See telemetry face sheet for immediately available ER MD   ? Location [facility]-Cardiac & Pulmonary Rehab   ? Staff Present Heath Lark, RN, BSN, Laveda Norman, BS, ACSM CEP, Exercise Physiologist;Joseph [place], [place]   ? Virtual Visit No   ? Medication changes reported     No   ? Fall or balance concerns reported    No   ? Warm-up and Cool-down Performed on first and last piece of equipment   ? Resistance Training Performed Yes   ? VAD Patient? No   ? PAD/SET Patient? No   ?  ? Pain Assessment  ? Currently in Pain? No/denies   ? ?  ?  ? ?  ? ? ? ? ? ?Social History  ? ?Tobacco Use  ?Smoking Status Former  ? Packs/day: 2.50  ? Types: Cigarettes  ? Quit date: 08/21/1991  ? Years since quitting: 30.0  ?Smokeless Tobacco Never  ? ? ?Goals Met:  ?Proper associated with RPD/PD & O2 Sat ?Independence with exercise equipment ?Exercise tolerated well ?No report of concerns or symptoms today ? ?Goals Unmet:  ?Not Applicable ? ?Comments: Pt able to follow exercise prescription today without complaint.  Will continue to monitor for progression. ? ? ? ?Dr. Emily Filbert is Medical Director for [place].  ?Dr. Ottie Glazier is Medical Director for [facility] Pulmonary Rehabilitation. ?

## 2021-08-15 DIAGNOSIS — I5022 Chronic systolic (congestive) heart failure: Secondary | ICD-10-CM

## 2021-08-15 NOTE — Progress Notes (Signed)
Daily Session Note ? ?Patient Details  ?Name: Willie Clark ?MRN: [id number] ?Date of Birth: March 25, 1951 ?Referring Provider:   ?Flowsheet Row Pulmonary Rehab from 05/23/2021 in [facility] Cardiac and Pulmonary Rehab  ?Referring Provider [name] MD  ? ?  ? ? ?Encounter Date: 08/15/2021 ? ?Check In: ? Session Check In - 08/15/21 0923   ? ?  ? Check-In  ? Supervising physician immediately available to respond to emergencies See telemetry face sheet for immediately available ER MD   ? Location [facility]-Cardiac & Pulmonary Rehab   ? Staff Present Birdie Sons, MPA, RN;Joseph [place], RCP,RRT,BSRT;Kara [place], MS, ASCM CEP, Exercise Physiologist   ? Virtual Visit No   ? Medication changes reported     No   ? Fall or balance concerns reported    No   ? Tobacco Cessation No Change   ? Warm-up and Cool-down Performed on first and last piece of equipment   ? Resistance Training Performed Yes   ? VAD Patient? No   ? PAD/SET Patient? No   ?  ? Pain Assessment  ? Currently in Pain? No/denies   ? ?  ?  ? ?  ? ? ? ? ? ?Social History  ? ?Tobacco Use  ?Smoking Status Former  ? Packs/day: 2.50  ? Types: Cigarettes  ? Quit date: 08/21/1991  ? Years since quitting: 30.0  ?Smokeless Tobacco Never  ? ? ?Goals Met:  ?Independence with exercise equipment ?Exercise tolerated well ?No report of concerns or symptoms today ?Strength training completed today ? ?Goals Unmet:  ?Not Applicable ? ?Comments: Pt able to follow exercise prescription today without complaint.  Will continue to monitor for progression. ? ? ? ?Dr. Emily Filbert is Medical Director for [place].  ?Dr. Ottie Glazier is Medical Director for [facility] Pulmonary Rehabilitation. ?

## 2021-08-17 ENCOUNTER — Encounter: Payer: Medicare HMO | Admitting: *Deleted

## 2021-08-17 VITALS — Ht 70.0 in | Wt 204.9 lb

## 2021-08-17 DIAGNOSIS — I5022 Chronic systolic (congestive) heart failure: Secondary | ICD-10-CM | POA: Diagnosis not present

## 2021-08-17 NOTE — Progress Notes (Signed)
Daily Session Note ? ?Patient Details  ?Name: Willie Clark ?MRN: [id number] ?Date of Birth: [birth year] ?Referring Provider:   ?Flowsheet Row Pulmonary Rehab from 05/23/2021 in [facility] Cardiac and Pulmonary Rehab  ?Referring Provider [name] MD  ? ?  ? ? ?Encounter Date: 08/17/2021 ? ?Check In: ? Session Check In - 08/17/21 1009   ? ?  ? Check-In  ? Supervising physician immediately available to respond to emergencies See telemetry face sheet for immediately available ER MD   ? Location [facility]-Cardiac & Pulmonary Rehab   ? Staff Present Heath Lark, RN, BSN, CCRP;Jessica Valmeyer, MA, RCEP, CCRP, CCET;Joseph [place], [place]   ? Virtual Visit No   ? Medication changes reported     No   ? Fall or balance concerns reported    No   ? Warm-up and Cool-down Performed on first and last piece of equipment   ? Resistance Training Performed Yes   ? VAD Patient? No   ? PAD/SET Patient? No   ?  ? Pain Assessment  ? Currently in Pain? No/denies   ? ?  ?  ? ?  ? ? ? 6 Minute Walk   ? ? [place] Name 02/26/21 0933 05/23/21 1442 08/17/21 0935  ?  ? 6 Minute Walk  ? Phase Discharge Initial Discharge  ? Distance 1500 feet 1460 feet 1595 feet  ? Distance % Change 23 % -- 9.2 %  ? Distance Feet Change 285 ft -- 135 ft  ? Walk Time 6 minutes 6 minutes 6 minutes  ? # of Rest Breaks 0 0 0  ? MPH 2.84 2.77 3.02  ? METS 3.1 3.02 3.24  ? RPE 15 13 13   ? Perceived Dyspnea  1 2 1   ? VO2 Peak 10.86 10.56 11.33  ? Symptoms No Yes (comment) Yes (comment)  ? Comments -- SOB SOB  ? Resting HR 60 bpm 60 bpm 55 bpm  ? Resting BP 110/70 136/76 122/64  ? Resting Oxygen Saturation  95 % 94 % 94 %  ? Exercise Oxygen Saturation  during 6 min walk 95 % 93 % 88 %  ? Max Ex. HR 81 bpm 84 bpm 86 bpm  ? Max Ex. BP 142/70 146/72 136/64  ? 2 Minute Post BP -- 122/64 128/62  ?  ? Interval HR  ? 1 Minute HR -- 74 --  ? 2 Minute HR -- 80 --  ? 3 Minute HR -- 83 --  ? 4 Minute HR -- 83 --  ? 5 Minute HR -- 83 --  ? 6 Minute HR -- 84 --  ? 2 Minute Post HR -- 57 --  ?  Interval Heart Rate? -- Yes --  ?  ? Interval Oxygen  ? Interval Oxygen? -- Yes --  ? Baseline Oxygen Saturation % -- 94 % --  ? 1 Minute Oxygen Saturation % -- 94 % --  ? 1 Minute Liters of Oxygen -- 0 L  Room Air --  ? 2 Minute Oxygen Saturation % -- 94 % --  ? 2 Minute Liters of Oxygen -- 0 L --  ? 3 Minute Oxygen Saturation % -- 94 % --  ? 3 Minute Liters of Oxygen -- 0 L --  ? 4 Minute Oxygen Saturation % -- 93 % --  ? 4 Minute Liters of Oxygen -- 0 L --  ? 5 Minute Oxygen Saturation % -- 93 % --  ? 5 Minute Liters of Oxygen -- 0 L --  ?  6 Minute Oxygen Saturation % -- 93 % --  ? 6 Minute Liters of Oxygen -- 0 L --  ? 2 Minute Post Oxygen Saturation % -- 96 % --  ? 2 Minute Post Liters of Oxygen -- 0 L --  ? ?  ?  ? ?  ? ? ? ? ?Social History  ? ?Tobacco Use  ?Smoking Status Former  ? Packs/day: 2.50  ? Types: Cigarettes  ? Quit date: 08/21/1991  ? Years since quitting: 30.0  ?Smokeless Tobacco Never  ? ? ?Goals Met:  ?Proper associated with RPD/PD & O2 Sat ?Independence with exercise equipment ?Exercise tolerated well ?No report of concerns or symptoms today ? ?Goals Unmet:  ?Not Applicable ? ?Comments: Pt able to follow exercise prescription today without complaint.  Will continue to monitor for progression. ? ? ? ?Dr. Emily Filbert is Medical Director for [place].  ?Dr. Ottie Glazier is Medical Director for [facility] Pulmonary Rehabilitation. ?

## 2021-08-20 ENCOUNTER — Encounter: Payer: Medicare HMO | Attending: Internal Medicine | Admitting: *Deleted

## 2021-08-20 DIAGNOSIS — Z5189 Encounter for other specified aftercare: Secondary | ICD-10-CM | POA: Insufficient documentation

## 2021-08-20 DIAGNOSIS — I5022 Chronic systolic (congestive) heart failure: Secondary | ICD-10-CM | POA: Diagnosis present

## 2021-08-20 NOTE — Progress Notes (Signed)
Daily Session Note ? ?Patient Details  ?Name: Willie Clark ?MRN: [id number] ?Date of Birth: Jan 28, 1951 ?Referring Provider:   ?Flowsheet Row Pulmonary Rehab from 05/23/2021 in [facility] Cardiac and Pulmonary Rehab  ?Referring Provider [name] MD  ? ?  ? ? ?Encounter Date: 08/20/2021 ? ?Check In: ? Session Check In - 08/20/21 1009   ? ?  ? Check-In  ? Supervising physician immediately available to respond to emergencies See telemetry face sheet for immediately available ER MD   ? Location [facility]-Cardiac & Pulmonary Rehab   ? Staff Present Heath Lark, RN, BSN, [employer], MPA, RN;Kelly Amedeo Plenty, BS, ACSM CEP, Exercise Physiologist;Jessica Collins, MA, RCEP, CCRP, CCET   ? Virtual Visit No   ? Medication changes reported     No   ? Fall or balance concerns reported    No   ? Warm-up and Cool-down Performed on first and last piece of equipment   ? Resistance Training Performed Yes   ? VAD Patient? No   ? PAD/SET Patient? No   ?  ? Pain Assessment  ? Currently in Pain? No/denies   ? ?  ?  ? ?  ? ? ? ? ? ?Social History  ? ?Tobacco Use  ?Smoking Status Former  ? Packs/day: 2.50  ? Types: Cigarettes  ? Quit date: 08/21/1991  ? Years since quitting: 30.0  ?Smokeless Tobacco Never  ? ? ?Goals Met:  ?Independence with exercise equipment ?Exercise tolerated well ?No report of concerns or symptoms today ? ?Goals Unmet:  ?Not Applicable ? ?Comments: Pt able to follow exercise prescription today without complaint.  Will continue to monitor for progression. ? ? ? ?Dr. Emily Filbert is Medical Director for [place].  ?Dr. Ottie Glazier is Medical Director for [facility] Pulmonary Rehabilitation. ?

## 2021-08-20 NOTE — Telephone Encounter (Signed)
I s/w Bianca with requesting office. In our discussion today, it has been made clear the procedure was cancelled due to the pt had an infection at the time. In speaking with Wyatt Portela, it has been determined to have the pt see his cardiologist first as planned 09/11/21 @ 3:20. Will go from there is Dr. Fletcher Anon clears the pt. Wyatt Portela is aware per recommendations from Dr. Fletcher Anon preferred not to hold Plavix x 12 months from PCI 10/2020. I will send FYI to [facility] as well. I will remove from the pre op call back pool at this time. I will add pre op clearance to appt notes for MD.  ?

## 2021-08-22 DIAGNOSIS — I5022 Chronic systolic (congestive) heart failure: Secondary | ICD-10-CM

## 2021-08-22 NOTE — Progress Notes (Signed)
Daily Session Note ? ?Patient Details  ?Name: Willie Clark ?MRN: [id number] ?Date of Birth: 1951-03-12 ?Referring Provider:   ?Flowsheet Row Pulmonary Rehab from 05/23/2021 in [facility] Cardiac and Pulmonary Rehab  ?Referring Provider [name] MD  ? ?  ? ? ?Encounter Date: 08/22/2021 ? ?Check In: ? Session Check In - 08/22/21 0935   ? ?  ? Check-In  ? Supervising physician immediately available to respond to emergencies See telemetry face sheet for immediately available ER MD   ? Location [facility]-Cardiac & Pulmonary Rehab   ? Staff Present Birdie Sons, MPA, RN;Joseph [place], RCP,RRT,BSRT;Melissa Orofino, RDN, LDN   ? Virtual Visit No   ? Medication changes reported     No   ? Fall or balance concerns reported    No   ? Tobacco Cessation No Change   ? Warm-up and Cool-down Performed on first and last piece of equipment   ? Resistance Training Performed Yes   ? VAD Patient? No   ? PAD/SET Patient? No   ?  ? Pain Assessment  ? Currently in Pain? No/denies   ? ?  ?  ? ?  ? ? ? ? ? ?Social History  ? ?Tobacco Use  ?Smoking Status Former  ? Packs/day: 2.50  ? Types: Cigarettes  ? Quit date: 08/21/1991  ? Years since quitting: 30.0  ?Smokeless Tobacco Never  ? ? ?Goals Met:  ?Independence with exercise equipment ?Exercise tolerated well ?No report of concerns or symptoms today ?Strength training completed today ? ?Goals Unmet:  ?Not Applicable ? ?Comments: Pt able to follow exercise prescription today without complaint.  Will continue to monitor for progression. ? ? ? ?Dr. Emily Filbert is Medical Director for [place].  ?Dr. Ottie Glazier is Medical Director for [facility] Pulmonary Rehabilitation. ?

## 2021-08-22 NOTE — Patient Instructions (Signed)
Discharge Patient Instructions ? ?Patient Details  ?Name: Willie Clark ?MRN: [id number] ?Date of Birth: 01-05-1951 ?Referring Provider:  Wellington Hampshire, MD ? ? ?Number of Visits: 70 ? ?Reason for Discharge:  ?Patient reached a stable level of exercise. ?Patient independent in their exercise. ?Patient has met program and personal goals. ? ?Smoking History:  ?Social History  ? ?Tobacco Use  ?Smoking Status Former  ? Packs/day: 2.50  ? Types: Cigarettes  ? Quit date: 08/21/1991  ? Years since quitting: 30.0  ?Smokeless Tobacco Never  ? ? ?Diagnosis:  ?Heart failure, chronic systolic (HCC) ? ?Initial Exercise Prescription: ? Initial Exercise Prescription - 05/23/21 1400   ? ?  ? Date of Initial Exercise RX and Referring Provider  ? Date 05/23/21   ? Referring Provider [name] MD   ?  ? Oxygen  ? Maintain Oxygen Saturation 88% or higher   ?  ? Treadmill  ? MPH 2.5   ? Grade 0.5   ? Minutes 15   ? METs 3.09   ?  ? Arm Ergometer  ? Level 1   ? Watts 25   ? RPM 25   ? Minutes 15   ? METs 2   ?  ? REL-XR  ? Level 3   ? Speed 50   ? Minutes 15   ? METs 3   ?  ? Track  ? Laps 36   ? Minutes 15   ? METs 2.96   ?  ? Prescription Details  ? Frequency (times per week) 3   ? Duration Progress to 30 minutes of continuous aerobic without signs/symptoms of physical distress   ?  ? Intensity  ? THRR 40-80% of Max Heartrate 96-132   ? Ratings of Perceived Exertion 11-13   ? Perceived Dyspnea 0-4   ?  ? Progression  ? Progression Continue to progress workloads to maintain intensity without signs/symptoms of physical distress.   ?  ? Resistance Training  ? Training Prescription Yes   ? Weight 4 lb   ? Reps 10-15   ? ?  ?  ? ?  ? ? ?Discharge Exercise Prescription (Final Exercise Prescription Changes): ? Exercise Prescription Changes - 08/13/21 0800   ? ?  ? Response to Exercise  ? Blood Pressure (Admit) 124/72   ? Blood Pressure (Exit) 122/60   ? Heart Rate (Admit) 60 bpm   ? Heart Rate (Exercise) 80 bpm   ? Heart Rate (Exit) 63  bpm   ? Oxygen Saturation (Admit) 98 %   ? Oxygen Saturation (Exercise) 96 %   ? Oxygen Saturation (Exit) 98 %   ? Rating of Perceived Exertion (Exercise) 13   ? Perceived Dyspnea (Exercise) 2   ? Symptoms SOB   ? Duration Continue with 30 min of aerobic exercise without signs/symptoms of physical distress.   ? Intensity THRR unchanged   ?  ? Progression  ? Progression Continue to progress workloads to maintain intensity without signs/symptoms of physical distress.   ? Average METs 3.24   ?  ? Resistance Training  ? Training Prescription Yes   ? Weight 4 lb   ? Reps 10-15   ?  ? Interval Training  ? Interval Training No   ?  ? Treadmill  ? MPH 2.5   ? Grade 4.5   ? Minutes 15   ? METs 4.47   ?  ? Recumbant Bike  ? Level 4   ? Minutes 15   ?  METs 2.84   ?  ? NuStep  ? Level 5   ? Minutes 15   ? METs 4.4   ?  ? Arm Ergometer  ? Level 2.5   ? Minutes 15   ? METs 2.1   ?  ? REL-XR  ? Level 6   ? Minutes 15   ? METs 3.2   ?  ? Track  ? Laps 45   ? Minutes 15   ? METs 3.45   ?  ? Oxygen  ? Maintain Oxygen Saturation 88% or higher   ? ?  ?  ? ?  ? ? ?Functional Capacity: ? 6 Minute Walk   ? ? [place] Name 02/26/21 0933 05/23/21 1442 08/17/21 0935  ?  ? 6 Minute Walk  ? Phase Discharge Initial Discharge  ? Distance 1500 feet 1460 feet 1595 feet  ? Distance % Change 23 % -- 9.2 %  ? Distance Feet Change 285 ft -- 135 ft  ? Walk Time 6 minutes 6 minutes 6 minutes  ? # of Rest Breaks 0 0 0  ? MPH 2.84 2.77 3.02  ? METS 3.1 3.02 3.24  ? RPE 15 13 13   ? Perceived Dyspnea  1 2 1   ? VO2 Peak 10.86 10.56 11.33  ? Symptoms No Yes (comment) Yes (comment)  ? Comments -- SOB SOB  ? Resting HR 60 bpm 60 bpm 55 bpm  ? Resting BP 110/70 136/76 122/64  ? Resting Oxygen Saturation  95 % 94 % 94 %  ? Exercise Oxygen Saturation  during 6 min walk 95 % 93 % 88 %  ? Max Ex. HR 81 bpm 84 bpm 86 bpm  ? Max Ex. BP 142/70 146/72 136/64  ? 2 Minute Post BP -- 122/64 128/62  ?  ? Interval HR  ? 1 Minute HR -- 74 --  ? 2 Minute HR -- 80 --  ? 3 Minute HR  -- 83 --  ? 4 Minute HR -- 83 --  ? 5 Minute HR -- 83 --  ? 6 Minute HR -- 84 --  ? 2 Minute Post HR -- 57 --  ? Interval Heart Rate? -- Yes --  ?  ? Interval Oxygen  ? Interval Oxygen? -- Yes --  ? Baseline Oxygen Saturation % -- 94 % --  ? 1 Minute Oxygen Saturation % -- 94 % --  ? 1 Minute Liters of Oxygen -- 0 L  Room Air --  ? 2 Minute Oxygen Saturation % -- 94 % --  ? 2 Minute Liters of Oxygen -- 0 L --  ? 3 Minute Oxygen Saturation % -- 94 % --  ? 3 Minute Liters of Oxygen -- 0 L --  ? 4 Minute Oxygen Saturation % -- 93 % --  ? 4 Minute Liters of Oxygen -- 0 L --  ? 5 Minute Oxygen Saturation % -- 93 % --  ? 5 Minute Liters of Oxygen -- 0 L --  ? 6 Minute Oxygen Saturation % -- 93 % --  ? 6 Minute Liters of Oxygen -- 0 L --  ? 2 Minute Post Oxygen Saturation % -- 96 % --  ? 2 Minute Post Liters of Oxygen -- 0 L --  ? ?  ?  ? ?  ? ? ? ?Nutrition & Weight - Outcomes: ? Pre Biometrics - 05/23/21 1447   ? ?  ? Pre Biometrics  ? Height 5' 10"  (1.778 m)   ?  Weight 207 lb 11.2 oz (94.2 kg)   ? BMI (Calculated) 29.8   ? Single Leg Stand 21.3 seconds   ? ?  ?  ? ?  ? ? Post Biometrics - 08/17/21 2449   ? ?  ?  Post  Biometrics  ? Height 5' 10"  (1.778 m)   ? Weight 204 lb 14.4 oz (92.9 kg)   ? BMI (Calculated) 29.4   ? Single Leg Stand 6 seconds   ? ?  ?  ? ?  ? ? ?Nutrition: ? Nutrition Therapy & Goals - 05/28/21 1148   ? ?  ? Personal Nutrition Goals  ? Nutrition Goal [name] has recently completed the program and would not like to meet with the RD at this time. Will continue to follow up.   ? ?  ?  ? ?  ? ? ? ?Goals reviewed with patient; copy given to patient. ?

## 2021-08-24 ENCOUNTER — Encounter: Payer: Medicare HMO | Admitting: *Deleted

## 2021-08-24 ENCOUNTER — Other Ambulatory Visit: Payer: Self-pay | Admitting: Cardiovascular Disease

## 2021-08-24 DIAGNOSIS — I5022 Chronic systolic (congestive) heart failure: Secondary | ICD-10-CM

## 2021-08-24 NOTE — Progress Notes (Signed)
Discharge Progress Report ? ?Patient Details  ?Name: Willie Clark ?MRN: [id number] ?Date of Birth: 07/24/1950 ?Referring Provider:   ?Flowsheet Row Pulmonary Rehab from 05/23/2021 in [facility] Cardiac and Pulmonary Rehab  ?Referring Provider [name] MD  ? ?  ? ? ? ?Number of Visits: 37 ? ?Reason for Discharge:  ?Patient reached a stable level of exercise. ?Patient independent in their exercise. ? ?Diagnosis:  ?Heart failure, chronic systolic ([place]) ? ?ADL [facility]: ? Pulmonary Assessment Scores   ? ? [place] Name 05/23/21 1450 08/22/21 1020  ?  ?  ? ADL [facility]SD  ? ADL Phase Entry Exit   ? SOB Score total 40 45   ? Rest 1 1   ? Walk 3 2   ? Stairs 3 3   ? Bath 2 3   ? Dress 2 5   ? Shop 0 1   ?  ? CAT Score  ? CAT Score 13 21   ?  ? mMRC Score  ? mMRC Score 1 1   ? ?  ?  ? ?  ? ? ?Initial Exercise Prescription: ? Initial Exercise Prescription - 05/23/21 1400   ? ?  ? Date of Initial Exercise RX and Referring Provider  ? Date 05/23/21   ? Referring Provider [name] MD   ?  ? Oxygen  ? Maintain Oxygen Saturation 88% or higher   ?  ? Treadmill  ? MPH 2.5   ? Grade 0.5   ? Minutes 15   ? METs 3.09   ?  ? Arm Ergometer  ? Level 1   ? Watts 25   ? RPM 25   ? Minutes 15   ? METs 2   ?  ? REL-XR  ? Level 3   ? Speed 50   ? Minutes 15   ? METs 3   ?  ? Track  ? Laps 36   ? Minutes 15   ? METs 2.96   ?  ? Prescription Details  ? Frequency (times per week) 3   ? Duration Progress to 30 minutes of continuous aerobic without signs/symptoms of physical distress   ?  ? Intensity  ? THRR 40-80% of Max Heartrate 96-132   ? Ratings of Perceived Exertion 11-13   ? Perceived Dyspnea 0-4   ?  ? Progression  ? Progression Continue to progress workloads to maintain intensity without signs/symptoms of physical distress.   ?  ? Resistance Training  ? Training Prescription Yes   ? Weight 4 lb   ? Reps 10-15   ? ?  ?  ? ?  ? ? ?Discharge Exercise Prescription (Final Exercise Prescription Changes): ? Exercise Prescription Changes - 08/13/21 0800    ? ?  ? Response to Exercise  ? Blood Pressure (Admit) 124/72   ? Blood Pressure (Exit) 122/60   ? Heart Rate (Admit) 60 bpm   ? Heart Rate (Exercise) 80 bpm   ? Heart Rate (Exit) 63 bpm   ? Oxygen Saturation (Admit) 98 %   ? Oxygen Saturation (Exercise) 96 %   ? Oxygen Saturation (Exit) 98 %   ? Rating of Perceived Exertion (Exercise) 13   ? Perceived Dyspnea (Exercise) 2   ? Symptoms SOB   ? Duration Continue with 30 min of aerobic exercise without signs/symptoms of physical distress.   ? Intensity THRR unchanged   ?  ? Progression  ? Progression Continue to progress workloads to maintain intensity without signs/symptoms of  physical distress.   ? Average METs 3.24   ?  ? Resistance Training  ? Training Prescription Yes   ? Weight 4 lb   ? Reps 10-15   ?  ? Interval Training  ? Interval Training No   ?  ? Treadmill  ? MPH 2.5   ? Grade 4.5   ? Minutes 15   ? METs 4.47   ?  ? Recumbant Bike  ? Level 4   ? Minutes 15   ? METs 2.84   ?  ? NuStep  ? Level 5   ? Minutes 15   ? METs 4.4   ?  ? Arm Ergometer  ? Level 2.5   ? Minutes 15   ? METs 2.1   ?  ? REL-XR  ? Level 6   ? Minutes 15   ? METs 3.2   ?  ? Track  ? Laps 45   ? Minutes 15   ? METs 3.45   ?  ? Oxygen  ? Maintain Oxygen Saturation 88% or higher   ? ?  ?  ? ?  ? ? ?Functional Capacity: ? 6 Minute Walk   ? ? [place] Name 02/26/21 0933 05/23/21 1442 08/17/21 0935  ?  ? 6 Minute Walk  ? Phase Discharge Initial Discharge  ? Distance 1500 feet 1460 feet 1595 feet  ? Distance % Change 23 % -- 9.2 %  ? Distance Feet Change 285 ft -- 135 ft  ? Walk Time 6 minutes 6 minutes 6 minutes  ? # of Rest Breaks 0 0 0  ? MPH 2.84 2.77 3.02  ? METS 3.1 3.02 3.24  ? RPE '15 13 13  '$ ? Perceived Dyspnea  '1 2 1  '$ ? VO2 Peak 10.86 10.56 11.33  ? Symptoms No Yes (comment) Yes (comment)  ? Comments -- SOB SOB  ? Resting HR 60 bpm 60 bpm 55 bpm  ? Resting BP 110/70 136/76 122/64  ? Resting Oxygen Saturation  95 % 94 % 94 %  ? Exercise Oxygen Saturation  during 6 min walk 95 % 93 % 88 %  ?  Max Ex. HR 81 bpm 84 bpm 86 bpm  ? Max Ex. BP 142/70 146/72 136/64  ? 2 Minute Post BP -- 122/64 128/62  ?  ? Interval HR  ? 1 Minute HR -- 74 --  ? 2 Minute HR -- 80 --  ? 3 Minute HR -- 83 --  ? 4 Minute HR -- 83 --  ? 5 Minute HR -- 83 --  ? 6 Minute HR -- 84 --  ? 2 Minute Post HR -- 57 --  ? Interval Heart Rate? -- Yes --  ?  ? Interval Oxygen  ? Interval Oxygen? -- Yes --  ? Baseline Oxygen Saturation % -- 94 % --  ? 1 Minute Oxygen Saturation % -- 94 % --  ? 1 Minute Liters of Oxygen -- 0 L  Room Air --  ? 2 Minute Oxygen Saturation % -- 94 % --  ? 2 Minute Liters of Oxygen -- 0 L --  ? 3 Minute Oxygen Saturation % -- 94 % --  ? 3 Minute Liters of Oxygen -- 0 L --  ? 4 Minute Oxygen Saturation % -- 93 % --  ? 4 Minute Liters of Oxygen -- 0 L --  ? 5 Minute Oxygen Saturation % -- 93 % --  ? 5 Minute Liters of Oxygen --  0 L --  ? 6 Minute Oxygen Saturation % -- 93 % --  ? 6 Minute Liters of Oxygen -- 0 L --  ? 2 Minute Post Oxygen Saturation % -- 96 % --  ? 2 Minute Post Liters of Oxygen -- 0 L --  ? ?  ?  ? ?  ? ? ?Psychological, QOL, Others - Outcomes: ?PHQ 2/9: ? ?  08/22/2021  ? 10:07 AM 06/22/2021  ?  9:32 AM 05/23/2021  ?  2:52 PM 02/28/2021  ?  9:34 AM 12/12/2020  ?  9:02 AM  ?Depression screen PHQ 2/9  ?Decreased Interest 1 0 '1 2 2  '$ ?Down, Depressed, Hopeless 0 0 2 0 1  ?PHQ - 2 Score 1 0 '3 2 3  '$ ?Altered sleeping 0 '3 3 2 3  '$ ?Tired, decreased energy 0 '3 3 2 3  '$ ?Change in appetite 0 1 0 1 0  ?Feeling bad or failure about yourself  0 0 0 0 1  ?Trouble concentrating 0 0 1 0 1  ?Moving slowly or fidgety/restless 0 0 0 1 1  ?Suicidal thoughts 0 0 0 0 0  ?PHQ-9 Score '1 7 10 8 12  '$ ?Difficult doing work/chores Not difficult at all Somewhat difficult Somewhat difficult Not difficult at all Somewhat difficult  ? ? ?Quality of Life: ? Quality of Life - 08/22/21 1009   ? ?  ? Quality of Life Scores  ? Health/Function Pre 16.67 %   ? Health/Function Post 19.43 %   ? Health/Function % Change 16.56 %   ? Socioeconomic Pre  22.13 %   ? Socioeconomic Post 24.06 %   ? Socioeconomic % Change  8.72 %   ? Psych/Spiritual Pre 23.36 %   ? Psych/Spiritual Post 30 %   ? Psych/Spiritual % Change 28.42 %   ? Family Pre 20.2 %   ? Family Post 27.6 %   ? Family % Change 36.63 %   ? GLOBAL Pre 19.45 %   ? GLOBAL Post 23.77 %   ? GLOBAL % Change 22.21 %   ? ?  ?  ? ?  ? ? ? ?  ? ? ? ? ? ? ? ? ?Nutrition & Weight - Outcomes: ? Pre Biometrics - 05/23/21 1447   ? ?  ? Pre Biometrics  ? Height '5\' 10"'$  (1.778 m)   ? Weight 207 lb 11.2 oz (94.2 kg)   ? BMI (Calculated) 29.8   ? Single Leg Stand 21.3 seconds   ? ?  ?  ? ?  ? ? Post Biometrics - 08/17/21 0277   ? ?  ?  Post  Biometrics  ? Height '5\' 10"'$  (1.778 m)   ? Weight 204 lb 14.4 oz (92.9 kg)   ? BMI (Calculated) 29.4   ? Single Leg Stand 6 seconds   ? ?  ?  ? ?  ? ? ?Nutrition: ? Nutrition Therapy & Goals - 05/28/21 1148   ? ?  ? Personal Nutrition Goals  ? Nutrition Goal [name] has recently completed the program and would not like to meet with the RD at this time. Will continue to follow up.   ? ?  ?  ? ?  ? ? ? ?Goals reviewed with patient; copy given to patient. ?

## 2021-08-24 NOTE — Progress Notes (Signed)
Daily Session Note ? ?Patient Details  ?Name: Willie Clark ?MRN: [id number] ?Date of Birth: Dec 31, 1950 ?Referring Provider:   ?Flowsheet Row Pulmonary Rehab from 05/23/2021 in [facility] Cardiac and Pulmonary Rehab  ?Referring Provider [name] MD  ? ?  ? ? ?Encounter Date: 08/24/2021 ? ?Check In: ? Session Check In - 08/24/21 1110   ? ?  ? Check-In  ? Supervising physician immediately available to respond to emergencies See telemetry face sheet for immediately available ER MD   ? Location [facility]-Cardiac & Pulmonary Rehab   ? Staff Present Alberteen Sam, MA, RCEP, CCRP, CCET;Joseph C[place], [place];Heath Lark, RN, BSN, CCRP   ? Virtual Visit No   ? Medication changes reported     Yes   ? Comments Stopping APRISO   ? Fall or balance concerns reported    No   ? Warm-up and Cool-down Performed on first and last piece of equipment   ? Resistance Training Performed Yes   ? VAD Patient? No   ? PAD/SET Patient? No   ?  ? Pain Assessment  ? Currently in Pain? No/denies   ? ?  ?  ? ?  ? ? ? ? ? ?Social History  ? ?Tobacco Use  ?Smoking Status Former  ? Packs/day: 2.50  ? Types: Cigarettes  ? Quit date: 08/21/1991  ? Years since quitting: 30.0  ?Smokeless Tobacco Never  ? ? ?Goals Met:  ?Proper associated with RPD/PD & O2 Sat ?Independence with exercise equipment ?Exercise tolerated well ?No report of concerns or symptoms today ? ?Goals Unmet:  ?Not Applicable ? ?Comments:  Willie Clark graduated today from  rehab with 35 sessions completed.  Details of the patient's exercise prescription and what He needs to do in order to continue the prescription and progress were discussed with patient.  Patient was given a copy of prescription and goals.  Patient verbalized understanding.  Willie Clark plans to continue to exercise by joining the [employer]. ? ? ? ?Dr. Emily Filbert is Medical Director for [place].  ?Dr. Ottie Glazier is Medical Director for [facility] Pulmonary Rehabilitation. ?

## 2021-08-24 NOTE — Progress Notes (Signed)
? ?   Willie Clark graduated today from  rehab with 35 sessions completed.  Details of the patient's exercise prescription and what He needs to do in order to continue the prescription and progress were discussed with patient.  Patient was given a copy of prescription and goals.  Patient verbalized understanding.  [name] plans to continue to exercise by joining the [employer]. ? ? ? 6 Minute Walk   ? ? [place] Name 02/26/21 0933 05/23/21 1442 08/17/21 0935  ?  ? 6 Minute Walk  ? Phase Discharge Initial Discharge  ? Distance 1500 feet 1460 feet 1595 feet  ? Distance % Change 23 % -- 9.2 %  ? Distance Feet Change 285 ft -- 135 ft  ? Walk Time 6 minutes 6 minutes 6 minutes  ? # of Rest Breaks 0 0 0  ? MPH 2.84 2.77 3.02  ? METS 3.1 3.02 3.24  ? RPE '15 13 13  '$ ? Perceived Dyspnea  '1 2 1  '$ ? VO2 Peak 10.86 10.56 11.33  ? Symptoms No Yes (comment) Yes (comment)  ? Comments -- SOB SOB  ? Resting HR 60 bpm 60 bpm 55 bpm  ? Resting BP 110/70 136/76 122/64  ? Resting Oxygen Saturation  95 % 94 % 94 %  ? Exercise Oxygen Saturation  during 6 min walk 95 % 93 % 88 %  ? Max Ex. HR 81 bpm 84 bpm 86 bpm  ? Max Ex. BP 142/70 146/72 136/64  ? 2 Minute Post BP -- 122/64 128/62  ?  ? Interval HR  ? 1 Minute HR -- 74 --  ? 2 Minute HR -- 80 --  ? 3 Minute HR -- 83 --  ? 4 Minute HR -- 83 --  ? 5 Minute HR -- 83 --  ? 6 Minute HR -- 84 --  ? 2 Minute Post HR -- 57 --  ? Interval Heart Rate? -- Yes --  ?  ? Interval Oxygen  ? Interval Oxygen? -- Yes --  ? Baseline Oxygen Saturation % -- 94 % --  ? 1 Minute Oxygen Saturation % -- 94 % --  ? 1 Minute Liters of Oxygen -- 0 L  Room Air --  ? 2 Minute Oxygen Saturation % -- 94 % --  ? 2 Minute Liters of Oxygen -- 0 L --  ? 3 Minute Oxygen Saturation % -- 94 % --  ? 3 Minute Liters of Oxygen -- 0 L --  ? 4 Minute Oxygen Saturation % -- 93 % --  ? 4 Minute Liters of Oxygen -- 0 L --  ? 5 Minute Oxygen Saturation % -- 93 % --  ? 5 Minute Liters of Oxygen -- 0 L --  ? 6 Minute Oxygen Saturation  % -- 93 % --  ? 6 Minute Liters of Oxygen -- 0 L --  ? 2 Minute Post Oxygen Saturation % -- 96 % --  ? 2 Minute Post Liters of Oxygen -- 0 L --  ? ?  ?  ? ?  ? ?Thankyou for the referral. We enjoyed working with Willie Clark.  ?

## 2021-08-24 NOTE — Progress Notes (Signed)
Pulmonary Individual Treatment Plan ? ?Patient Details  ?Name: Willie Clark ?MRN: [id number] ?Date of Birth: 09-16-1950 ?Referring Provider:   ?Flowsheet Row Pulmonary Rehab from 05/23/2021 in [facility] Cardiac and Pulmonary Rehab  ?Referring Provider [name] MD  ? ?  ? ? ?Initial Encounter Date:  ?Flowsheet Row Pulmonary Rehab from 05/23/2021 in [facility] Cardiac and Pulmonary Rehab  ?Date 05/23/21  ? ?  ? ? ?Visit Diagnosis: Heart failure, chronic systolic (HCC) ? ?Patient's Home Medications on Admission: ? ?Current Outpatient Medications:  ?  albuterol (PROVENTIL HFA;VENTOLIN HFA) 108 (90 Base) MCG/ACT inhaler, Inhale into the lungs every 4 (four) hours as needed for wheezing or shortness of breath., Disp: , Rfl:  ?  aspirin EC 81 MG EC tablet, Take 1 tablet (81 mg total) by mouth daily. Swallow whole., Disp: 30 tablet, Rfl: 11 ?  carvedilol (COREG) 3.125 MG tablet, TAKE 1 TABLET (3.125 MG TOTAL) BY MOUTH 2 (TWO) TIMES DAILY WITH A MEAL., Disp: 180 tablet, Rfl: 0 ?  citalopram (CELEXA) 20 MG tablet, Take 20 mg by mouth daily. , Disp: , Rfl:  ?  clopidogrel (PLAVIX) 75 MG tablet, TAKE (1) TABLET 75 MG DAILY., Disp: 90 tablet, Rfl: 3 ?  dicyclomine (BENTYL) 10 MG capsule, TAKE 1 CAPSULE THREE TIMES DAILY AS NEEDED, Disp: , Rfl:  ?  docusate calcium (SURFAK) 240 MG capsule, Take 240 mg by mouth daily., Disp: , Rfl:  ?  empagliflozin (JARDIANCE) 10 MG TABS tablet, Take 1 tablet (10 mg total) by mouth daily before breakfast., Disp: 90 tablet, Rfl: 3 ?  EPINEPHrine 0.3 mg/0.3 mL IJ SOAJ injection, Inject 0.3 mg into the muscle as needed. , Disp: , Rfl:  ?  fexofenadine (ALLEGRA) 180 MG tablet, Take 180 mg by mouth daily., Disp: , Rfl:  ?  finasteride (PROSCAR) 5 MG tablet, Take 5 mg by mouth daily., Disp: , Rfl:  ?  gabapentin (NEURONTIN) 800 MG tablet, Take 1 tablet by mouth 3 (three) times daily., Disp: , Rfl:  ?  isosorbide mononitrate (IMDUR) 60 MG 24 hr tablet, Take 60 mg by mouth daily., Disp: , Rfl:  ?  losartan  (COZAAR) 25 MG tablet, Take 25 mg by mouth daily., Disp: , Rfl:  ?  mesalamine (APRISO) 0.375 g 24 hr capsule, Take 1.4 mg by mouth daily., Disp: , Rfl:  ?  Multiple Vitamins-Minerals (HAIR SKIN AND NAILS FORMULA PO), Take 1 capsule by mouth daily., Disp: , Rfl:  ?  NALTREXONE HCL PO, Take 2.5 mg by mouth daily., Disp: , Rfl:  ?  nitroGLYCERIN (NITROSTAT) 0.4 MG SL tablet, Place 1 tablet (0.4 mg total) under the tongue every 5 (five) minutes as needed for chest pain., Disp: 90 tablet, Rfl: 1 ?  nystatin-triamcinolone ointment (MYCOLOG), Apply 1 application topically 2 (two) times daily., Disp: 30 g, Rfl: 0 ?  ondansetron (ZOFRAN) 4 MG tablet, Take 4 mg by mouth every 8 (eight) hours as needed for nausea or vomiting., Disp: , Rfl:  ?  oxyCODONE (OXY IR/ROXICODONE) 5 MG immediate release tablet, 0.5-1 mg every 6 (six) hours as needed., Disp: , Rfl:  ?  pantoprazole (PROTONIX) 40 MG tablet, Take 40 mg by mouth 2 (two) times daily. , Disp: , Rfl:  ?  Peppermint Oil (IBGARD PO), Take by mouth daily at 2 am., Disp: , Rfl:  ?  potassium chloride SA (KLOR-CON M20) 20 MEQ tablet, Take 1 tablet (20 mEq total) by mouth daily., Disp: 90 tablet, Rfl: 0 ?  Probiotic Product (PROBIOTIC  PO), Take by mouth daily at 8 pm., Disp: , Rfl:  ?  rOPINIRole (REQUIP) 1 MG tablet, Take 1 mg by mouth 2 (two) times daily., Disp: , Rfl:  ?  rosuvastatin (CRESTOR) 40 MG tablet, TAKE 1 TABLET EVERY DAY ( DOSE INCREASE ), Disp: 90 tablet, Rfl: 0 ?  spironolactone (ALDACTONE) 25 MG tablet, TAKE 1 TABLET EVERY DAY, Disp: 90 tablet, Rfl: 1 ?  torsemide (DEMADEX) 20 MG tablet, Take 2 tablets (40 mg total) by mouth daily., Disp: 180 tablet, Rfl: 2 ?  traMADol (ULTRAM) 50 MG tablet, Take 50 mg by mouth every 6 (six) hours as needed. (Patient not taking: Reported on 07/26/2021), Disp: , Rfl:  ?  VASCEPA 1 g capsule, TAKE 2 CAPSULES TWICE DAILY, Disp: 360 capsule, Rfl: 1 ?  vitamin B-12 (CYANOCOBALAMIN) 1000 MCG tablet, Take 1,000 mcg by mouth daily., Disp:  , Rfl:  ? ?Past Medical History: ?Past Medical History:  ?Diagnosis Date  ? Abnormal stress test   ? Anxiety   ? Arthritis   ? Barrett's esophagus   ? Bowel obstruction ([place]) 02/2009  ? small bowel  ? CHF (congestive heart failure) ([place])   ? Chronic chest pain   ? Coronary artery disease   ? a. 1993 CABG; b. 2007 Redo CABG; c. 09/2015 Cath: LM mild dzs, LAD 100ost, RI mild dzs, LCX 60p, OM1 100, RCA 100ost, VG->OM2 min irregs, LIMA->LAD nl, VG->RPDA 30ost, 20d, nl EF->Med rx.  ? Depression   ? Diverticulitis   ? Diverticulosis   ? Erectile dysfunction   ? Fibromyalgia   ? Gastric ulcer   ? Gastritis   ? GERD (gastroesophageal reflux disease)   ? Headache   ? Hemorrhoids   ? HFrEF (heart failure with reduced ejection fraction) ([place])   ? a. 09/2016 Echo: EF 55-60%, no rwma, triv AI, mild MR, mildly dil LA. -> as of April 2020: EF 40 to 45%.  Septal dyssynergy/hypokinesis due to postop state but otherwise unable to assess wall motion [name] due to poor study.  ? History of myocardial infarct at age less than 77 years   ? Hyperlipidemia   ? Hypertension   ? Kidney stones   ? going to [facility]  June 26 to see kidney specialist  ? Lightheadedness   ? Mass on back   ? Nephrolithiasis   ? Orthostatic hypotension   ? a. Improved after discontinuation of metoprolol.  ? OSA (obstructive sleep apnea)   ? a. On CPAP.  ? Peyronie disease   ? Pulmonary embolism ([place]) 01/2006  ? Post-op/treated  ? Skin lesions, generalized   ? Stroke [facility]) 1993  ? right side weakness no blood thinners   on Aspirin  ? Syncope and collapse   ? TIA (transient ischemic attack) 08/13/2014  ? Urticaria   ? ? ?Tobacco Use: ?Social History  ? ?Tobacco Use  ?Smoking Status Former  ? Packs/day: 2.50  ? Types: Cigarettes  ? Quit date: 08/21/1991  ? Years since quitting: 30.0  ?Smokeless Tobacco Never  ? ? ?Labs: ?Review Flowsheet   ? ?  ?  Latest Ref Rng & Units 12/11/2019 12/13/2019 02/07/2020 11/14/2020  ?Labs for [facility] Cardiac and Pulmonary Rehab  ?Cholestrol  0 - 200 mg/dL  181   165     ?LDL (calc) 0 - 99 mg/dL  84   61     ?HDL-C >40 mg/dL  35   41     ?Trlycerides <150 mg/dL  312  317     ?Hemoglobin A1c 4.8 - 5.6 % 6.0     6.1    ?Bicarbonate 20.0 - 28.0 mmol/L      ?O2 Saturation %      ? ?  06/01/2021  ?Labs for [facility] Cardiac and Pulmonary Rehab  ?Cholestrol   ?LDL (calc)   ?HDL-C   ?Trlycerides   ?Hemoglobin A1c   ?Bicarbonate 32.3    ?O2 Saturation 71.3    ?  ? ? Multiple values from one day are sorted in reverse-chronological order  ?  ?  ? ? ? ?Pulmonary Assessment Scores: ? Pulmonary Assessment Scores   ? ? [place] Name 05/23/21 1450 08/22/21 1020  ?  ?  ? ADL UCSD  ? ADL Phase Entry Exit   ? SOB Score total 40 45   ? Rest 1 1   ? Walk 3 2   ? Stairs 3 3   ? Bath 2 3   ? Dress 2 5   ? Shop 0 1   ?  ? CAT Score  ? CAT Score 13 21   ?  ? mMRC Score  ? mMRC Score 1 1   ? ?  ?  ? ?  ?  ?UCSD: ?Self-administered rating of dyspnea associated with activities of daily living (ADLs) ?6-point scale (0 = "not at all" to 5 = "maximal or unable to do because of breathlessness")  ?Scoring Scores range from 0 to 120.  Minimally important difference is 5 units ? ?CAT: ?CAT can identify the health impairment of COPD patients and is better correlated with disease progression.  ?CAT has a scoring range of zero to 40. The CAT score is classified into four groups of low (less than 10), medium (10 - 20), high (21-30) and very high (31-40) based on the impact level of disease on health status. A CAT score over 10 suggests significant symptoms.  A worsening CAT score could be explained by an exacerbation, poor medication adherence, poor inhaler technique, or progression of COPD or comorbid conditions.  ?CAT MCID is 2 points ? ?mMRC: ?mMRC (Modified Medical Research Council) Dyspnea Scale is used to assess the degree of baseline functional disability in patients of respiratory disease due to dyspnea. ?No minimal important difference is established. A decrease in score of 1 point or greater  is considered a positive change.  ? ?Pulmonary Function Assessment: ? ? ?Exercise Target Goals: ?Exercise Program Goal: ?Individual exercise prescription set using results from initial 6 min walk test and T

## 2021-08-27 ENCOUNTER — Telehealth: Payer: Self-pay | Admitting: Cardiovascular Disease

## 2021-08-27 ENCOUNTER — Ambulatory Visit: Payer: Self-pay | Admitting: Surgery

## 2021-08-27 DIAGNOSIS — K5732 Diverticulitis of large intestine without perforation or abscess without bleeding: Secondary | ICD-10-CM

## 2021-08-27 MED ORDER — INDOCYANINE GREEN 25 MG IV SOLR
5.0000 mg | Freq: Once | INTRAVENOUS | Status: AC
  Filled 2021-08-27: qty 10

## 2021-08-27 NOTE — Telephone Encounter (Signed)
Based on prior recommendations from Dr. Fletcher Anon "I discussed with gastroenterology.  They are going to do the procedure while the patient is on dual antiplatelet therapy.  Given multiple stents in vein grafts, DAPT will be continued without interruption for at least 1 year since most recent PCI".  ? ?Patients needs to wait until 11/15/21 prior to interrupt DAPT. If procedure/surgery is urgent, this will be address during office visit with Dr. Fletcher Anon on 09/11/21.  ? ?I will route this recommendation to the requesting party via Epic fax function and remove from pre-op pool. ? ?Please call with questions. ? ?Leanor Kail, PA ?08/27/2021, 3:26 PM  ?

## 2021-08-27 NOTE — H&P (Signed)
Subjective: ? ?CC: Diverticulitis of large intestine without perforation or abscess without bleeding [K57.32] ? ?HPI: ? Willie Clark is a 71 y.o. male who returns for above.  Recurrent pain in same LLQ area again, persistent N/V. ? ?Colonoscopy showed TA, diverticulosis but no active infection, inflammation.  Pt and wife still wishing to proceed with resection ? ?  ?Past Medical History:  has a past medical history of Asthma without status asthmaticus, Barrett's esophagus (10/25/2014), Coronary disease, Depression, Diverticulosis (12/13/13), Erosive gastritis (10/25/2014), Gastric ulcer, Gastritis (12/13/13), GERD (gastroesophageal reflux disease), Hemorrhoids, Hyperlipidemia, Hypertension, Obstructive sleep apnea, Osteoarthritis, Pulmonary emboli (CMS-HCC), Reflux, and Sleep apnea. ? ?Past Surgical History:  has a past surgical history that includes Bowel resection secondary to small bowel obstruction (01/2009); Cervical neck surgery (05/2009); Shoulder surgery (12/31/2007); colonoscopy (04/04/2006); colonoscopy (12/19/2009); egd (11/23/2008); egd (12/19/2009); egd (02/18/2012); egd (05/05/2012); egd (06/22/2012); egd (01/11/2013); Cataract extraction (2011); Stent in heart (2007); Coronary artery bypass graft (1993 and 2007); Shoulder surgery (08/10/2012); L-spine surgery (2011); egd (12/13/13); Colonoscopy (12/13/13); egd (10/25/2014); Inguinal hernia repair (Bilateral, 01/23/2016); egd (02/03/2017); Colonoscopy; Upper gastrointestinal endoscopy (2018); egd (04/18/2017); Colonoscopy (10/28/2017); esophagogastrodoudenoscopy w/biopsy (N/A, 01/23/2018); and esophagogastrodoudenoscopy w/biopsy (N/A, 05/29/2018). ? ?Family History: family history includes Cancer in his brother; Diabetes type II in his mother; Gallbladder disease in his father; Heart disease in his brother and daughter; High blood pressure (Hypertension) in his father and mother; Liver disease in his father; Lung cancer in his father; Myocardial Infarction  (Heart attack) in his father and mother; Rheum arthritis in his mother; Stroke in his mother; Ulcers in his father. ? ?Social History:  reports that he quit smoking about 30 years ago. His smoking use included cigarettes. He has a 40.00 pack-year smoking history. He has never used smokeless tobacco. He reports current alcohol use. He reports that he does not use drugs. ? ?Current Medications: has a current medication list which includes the following prescription(s): acetaminophen, albuterol, aspirin, carvedilol, citalopram, clopidogrel, cyanocobalamin, dapagliflozin, dicyclomine, docusate, epinephrine, fexofenadine, finasteride, furosemide, gabapentin, hydrocortisone, indapamide, isosorbide mononitrate, isosorbide mononitrate, jardiance, losartan, methylcellulose, multivitamin with minerals, nitroglycerin, ondansetron, oxycodone, pantoprazole, potassium chloride, psyllium, psyllium husk, ropinirole, ropinirole, rosuvastatin, sodium, potassium, and magnesium, spironolactone, torsemide, triamcinolone, mesalamine, metronidazole, naltrexone hcl, neomycin, pregabalin, and vascepa. ? ?Allergies:  ?Allergies ?Allergen Reactions ? Amitriptyline Other (See Comments) ? Bentyl [Dicyclomine] Shortness Of Breath and Swelling ? Cyclobenzaprine Hives ? Entresto [Sacubitril-Valsartan] Itching ? Fish Derived Itching ? Fish Oil [Omega-3 Fatty Acids-Vitamin E] Itching ? Lidocaine Nausea and Dizziness ? Other Unknown ?  Onions, bacon ? Oxycodone-Acetaminophen Other (See Comments) ?  Lowers BP ? ? ?ROS:  ?A 15 point review of systems was performed and pertinent positives and negatives noted in HPI ?  ?Objective: ?  ? ?BP 96/60   Pulse 55   Ht 175.3 cm ('5\' 9"'$ ) Comment: Per chart  Wt 92.5 kg (204 lb) Comment: Per chart  BMI 30.13 kg/m?  ? ?Constitutional :  No distress, cooperative, alert ?Lymphatics/Throat:  Supple with no lymphadenopathy ?Respiratory:  Clear to auscultation bilaterally ?Cardiovascular:  Regular rate and  rhythm ?Gastrointestinal: Soft, TTP minor, all four quadrants, with no specific quadrant worse than other ?Musculoskeletal: Steady gait and movement ?Skin: Cool and moist, no surgical scars ?Psychiatric: Normal affect, non-agitated, not confused ?  ?  ?LABS:  ?  ? ? ?RADS: ?CLINICAL DATA:  Acute abdominal pain in a 71 year old male.  ? ?EXAM:  ?CT ABDOMEN AND PELVIS WITH CONTRAST  ? ?TECHNIQUE:  ?Multidetector CT imaging of the abdomen and  pelvis was performed  ?using the standard protocol following bolus administration of  ?intravenous contrast.  ? ?RADIATION DOSE REDUCTION: This exam was performed according to the  ?departmental dose-optimization program which includes automated  ?exposure control, adjustment of the mA and/or kV according to  ?patient size and/or use of iterative reconstruction technique.  ? ?CONTRAST:  41m OMNIPAQUE IOHEXOL 350 MG/ML SOLN  ? ?COMPARISON:  March 06, 2021.  ? ?FINDINGS:  ?Lower chest: Basilar atelectasis without effusion or sign of  ?consolidation.  ? ?Hepatobiliary: Paste that  ? ?Pancreas: Normal, without mass, inflammation or ductal dilatation.  ? ?Spleen: Normal size and contour.  ? ?Adrenals/Urinary Tract:  ? ?Adrenal glands are unremarkable. Symmetric renal enhancement. No  ?sign of hydronephrosis. No suspicious renal lesion or perinephric  ?stranding.  ? ?Urinary bladder is grossly unremarkable.  ? ?Small cysts in the bilateral kidneys.  ? ?Punctate calculi in the interpolar LEFT and RIGHT kidney may  ?approach 3 mm greatest size, 2 on the LEFT and a single calculus on  ?the RIGHT. No signs of ureteral calculus or perivesical stranding.  ? ?Stomach/Bowel: Small hiatal hernia. No perigastric strain. No sign  ?of small bowel obstruction or inflammation of the small bowel.  ? ?Normal appendix.  ? ?Pancolonic diverticulosis which is worse at the distal descending  ?and sigmoid colon. Focal inflammation extending from a diverticulum  ?at the descending/sigmoid junction with  adjacent fascial thickening  ?and pericolonic stranding extending from this area. No signs of  ?abscess. No free intraperitoneal air.  ? ?Vascular/Lymphatic:  ? ?Aortic atherosclerosis. No sign of aneurysm. Smooth contour of the  ?IVC. There is no gastrohepatic or hepatoduodenal ligament  ?lymphadenopathy. No retroperitoneal or mesenteric lymphadenopathy.  ? ?No pelvic sidewall lymphadenopathy.  ? ?Reproductive: Unremarkable by CT.  ? ?Other: Small fat containing umbilical hernia.  No ascites.  ? ?Musculoskeletal: No acute bone finding. No destructive bone process.  ?Spinal degenerative changes.  ? ?IMPRESSION:  ?1. Acute uncomplicated diverticulitis at the descending/sigmoid  ?junction.  ?2. Bilateral nephrolithiasis.  ?3. Moderate hepatic steatosis similar to the prior study.  ?4. Small hiatal hernia.  ?5. Small fat containing umbilical hernia.   ? ? ?Assessment: ? ?   ?Diverticulitis of large intestine without perforation or abscess without bleeding [K57.32]  ? ?CAD s/p CABG, mult stents in past on plavix ?Hx of PE per chart review ?OSA ? ?Pt is high risk to undergo any major abdominal procedures, but patient specifically stated he is "willing to die" to resolve the pain issues.  I specifically stated nausea/diarrhea episodes are unlikely to resolve proceeding with left hemicolectomy, and that the smaller diverticulosis on the right will have potential to become inflammed in the future.  I cannot recommend full colectomy at this point since there is only minimal diverticulosis on right based on CT.  Left hemicolectomy will likely prevent future acute episodes requiring hospitalization.   ? ?Wife and pt both verbalized understanding.  Still wishes to proceed with robotic assisted laparoscopic left hemicolectomy, with hopes of improvement at least for the pain.  They also understand diarrhea may worsen after surgery. ? ?Plan: ? ?  ? The risk of laparoscopic colon resection surgery includes, but not limited to,  recurrence, bleeding, chronic pain, post-op infxn, post-op SBO or ileus, hernias, resection of bowel, re-anastamosis, possible ostomy placement and need for re-operation to address said risks. The risks of general anest

## 2021-08-27 NOTE — Telephone Encounter (Signed)
? ?  Pre-operative Risk Assessment  ?  ?Patient Name: Willie Clark  ?DOB: May 25, 1950 ?MRN: [id number]  ? ?  ? ?Request for Surgical Clearance   ? ?Procedure:   Colectomy  ? ?Date of Surgery:  Clearance 09/11/21                              ?   ?Surgeon:  Dr Lysle Pearl ?Surgeon's Group or Practice Name:  [facility]  ?Phone number:  [phone] ?Fax number:  [phone] ?  ?Type of Clearance Requested:   ?- Pharmacy:  Hold Aspirin and Clopidogrel (Plavix) instructions ?  ?Type of Anesthesia:  Not Indicated ?  ?Additional requests/questions:   ? ?Signed, ?Caryl Pina Gerringer   ?08/27/2021, 2:40 PM  ? ?

## 2021-08-29 ENCOUNTER — Other Ambulatory Visit: Payer: Self-pay

## 2021-09-01 ENCOUNTER — Emergency Department
Admission: EM | Admit: 2021-09-01 | Discharge: 2021-09-01 | Disposition: A | Discharge status: Home / Self Care | Payer: Medicare HMO | Attending: Emergency Medicine | Admitting: Emergency Medicine

## 2021-09-01 ENCOUNTER — Emergency Department: Payer: Medicare HMO

## 2021-09-01 ENCOUNTER — Other Ambulatory Visit: Payer: Self-pay

## 2021-09-01 DIAGNOSIS — J449 Chronic obstructive pulmonary disease, unspecified: Secondary | ICD-10-CM | POA: Insufficient documentation

## 2021-09-01 DIAGNOSIS — I110 Hypertensive heart disease with heart failure: Secondary | ICD-10-CM | POA: Diagnosis not present

## 2021-09-01 DIAGNOSIS — Z955 Presence of coronary angioplasty implant and graft: Secondary | ICD-10-CM | POA: Insufficient documentation

## 2021-09-01 DIAGNOSIS — I509 Heart failure, unspecified: Secondary | ICD-10-CM | POA: Insufficient documentation

## 2021-09-01 DIAGNOSIS — I2510 Atherosclerotic heart disease of native coronary artery without angina pectoris: Secondary | ICD-10-CM | POA: Diagnosis not present

## 2021-09-01 DIAGNOSIS — I959 Hypotension, unspecified: Secondary | ICD-10-CM | POA: Diagnosis not present

## 2021-09-01 DIAGNOSIS — R42 Dizziness and giddiness: Secondary | ICD-10-CM | POA: Diagnosis present

## 2021-09-01 LAB — CBC
HCT: 40.3 % (ref 39.0–52.0)
Hemoglobin: 13.5 g/dL (ref 13.0–17.0)
MCH: 31.8 pg (ref 26.0–34.0)
MCHC: 33.5 g/dL (ref 30.0–36.0)
MCV: 94.8 fL (ref 80.0–100.0)
Platelets: 184 10*3/uL (ref 150–400)
RBC: 4.25 MIL/uL (ref 4.22–5.81)
RDW: 13.4 % (ref 11.5–15.5)
WBC: 6 10*3/uL (ref 4.0–10.5)
nRBC: 0 % (ref 0.0–0.2)

## 2021-09-01 LAB — BASIC METABOLIC PANEL
Anion gap: 13 (ref 5–15)
BUN: 12 mg/dL (ref 8–23)
CO2: 27 mmol/L (ref 22–32)
Calcium: 9.2 mg/dL (ref 8.9–10.3)
Chloride: 98 mmol/L (ref 98–111)
Creatinine, Ser: 0.93 mg/dL (ref 0.61–1.24)
GFR, Estimated: >60 mL/min (ref >60)
Glucose, Bld: 132 mg/dL — ABNORMAL HIGH (ref 70–99)
Potassium: 3.9 mmol/L (ref 3.5–5.1)
Sodium: 138 mmol/L (ref 135–145)

## 2021-09-01 LAB — TROPONIN I (HIGH SENSITIVITY): Troponin I (High Sensitivity): 5 ng/L (ref <18)

## 2021-09-01 MED ORDER — LACTATED RINGERS IV BOLUS
500.0000 mL | Freq: Once | INTRAVENOUS | Status: DC
Start: 2021-09-01 — End: 2021-09-01

## 2021-09-01 MED ORDER — ONDANSETRON HCL 4 MG/2ML IJ SOLN
4.0000 mg | Freq: Once | INTRAMUSCULAR | Status: AC
  Administered 2021-09-01: 4 mg via INTRAVENOUS
  Filled 2021-09-01: qty 2

## 2021-09-01 NOTE — ED Notes (Signed)
Pt given urinal and knows need for ua  ?

## 2021-09-01 NOTE — Discharge Instructions (Addendum)
Your blood pressure was low today, causing you to feel lightheaded and dizzy.  Your heart rate was also on the low side, which appears to have happened before.  Your work-up today was reassuring with no signs of heart issues or infection.  For now, you should stop your carvedilol (Coreg) as this could affect both your heart rate and your blood pressure.  Please schedule follow-up with your primary care doctor next week and follow-up with your cardiologist the week after that as previously scheduled.  Please return to the ER for new or worsening symptoms. ?

## 2021-09-01 NOTE — ED Triage Notes (Signed)
Pt in via EMS from home with c/o low BP. EMS reports pt with positive orthostatic chnages ? ?Sitting 130/70 ?Standing 90/60 ? ?HE 55, SR. Pt with hx of 2 heart surgeries, 96% RA, 97.7 temp oral, CBG 133. Pt takes plavix. 124/78 en route to ED. #18 to left wrist. Pt was given 221ms of NS. Pt also with hx of CHF ?

## 2021-09-01 NOTE — ED Triage Notes (Signed)
Pt to ED via EMS, pt states he felt weak and sweaty and nauseated, pt states it happens frequently, and he usually goes to bed until it feels better. Pt states he feels nauseated at present, pt pale.  ?

## 2021-09-01 NOTE — ED Notes (Signed)
Pt states when he was at home he began feeling nauseas and lightheaded and Pt began sweating. Pt states he feels that way when his blood pressure drops. Pt checked his BP and it was 60 systolic. Pt symptoms have resolved at this time.  ?

## 2021-09-01 NOTE — ED Notes (Signed)
Pt placed in recliner with feet up in sub wait. Wife with pt ?

## 2021-09-01 NOTE — ED Provider Notes (Signed)
? ?[facility] ?Provider Note ? ? ? Event Date/Time  ? First MD Initiated Contact with Patient 09/01/21 1345   ?  (approximate) ? ? ?History  ? ?Chief Complaint ?Hypotension ? ? ?HPI ? ?Willie Clark is a 71 y.o. male with past medical history of hypertension, CAD status post CABG, sinus bradycardia, CHF, COPD, PE, and ulcerative colitis who presents to the ED complaining of hypertension.  Patient reports that around noon today he suddenly began to feel very dizzy and lightheaded, almost like he was going to pass out.  He states this typically happens when his blood pressure gets low, which she states is common for him.  He checked his blood pressure and found it to be in the 16X systolic, subsequently called EMS.  Reports some tightness in his chest currently, but states this occurs to him most days and it is no worse than usual.  He denies any fevers, cough, or difficulty breathing.  He has not had any pain in his abdomen, nausea, vomiting, diarrhea, or dysuria.  He had positive orthostatic vital signs with EMS, noted to be hypotensive upon standing.  He does report some mild swelling in his legs recently, no worse than usual. ?  ? ? ?Physical Exam  ? ?Triage Vital Signs: ?ED Triage Vitals  ?Enc Vitals Group  ?   BP 09/01/21 1332 (!) 86/60  ?   Pulse Rate 09/01/21 1332 (!) 52  ?   Resp 09/01/21 1332 20  ?   Temp 09/01/21 1332 98.2 ?F (36.8 ?C)  ?   Temp Source 09/01/21 1332 Oral  ?   SpO2 09/01/21 1332 94 %  ?   Weight 09/01/21 1333 199 lb (90.3 kg)  ?   Height 09/01/21 1333 '5\' 8"'$  (1.727 m)  ?   Head Circumference --   ?   Peak Flow --   ?   Pain Score 09/01/21 1333 5  ?   Pain Loc --   ?   Pain Edu? --   ?   Excl. in [place]? --   ? ? ?Most recent vital signs: ?Vitals:  ? 09/01/21 1430 09/01/21 1500  ?BP: 106/68 111/66  ?Pulse: (!) 54 (!) 55  ?Resp: 13 12  ?Temp:    ?SpO2: 100% 96%  ? ? ?Constitutional: Alert and oriented. ?Eyes: Conjunctivae are normal. ?Head: Atraumatic. ?Nose: No  congestion/rhinnorhea. ?Mouth/Throat: Mucous membranes are moist.  ?Cardiovascular: Bradycardic, regular rhythm. Grossly normal heart sounds.  2+ radial pulses bilaterally. ?Respiratory: Normal respiratory effort.  No retractions. Lungs CTAB. ?Gastrointestinal: Soft and nontender. No distention. ?Musculoskeletal: No lower extremity tenderness, 1+ pitting edema around ankles bilaterally. ?Neurologic:  Normal speech and language. No gross focal neurologic deficits are appreciated. ? ? ? ?ED Results / Procedures / Treatments  ? ?Labs ?(all labs ordered are listed, but only abnormal results are displayed) ?Labs Reviewed  ?BASIC METABOLIC PANEL - Abnormal; Notable for the following components:  ?    Result Value  ? Glucose, Bld 132 (*)   ? All other components within normal limits  ?CBC  ?URINALYSIS, ROUTINE W REFLEX MICROSCOPIC  ?TROPONIN I (HIGH SENSITIVITY)  ? ? ? ?EKG ? ?ED ECG REPORT ?Tempie Hoist, the attending physician, personally viewed and interpreted this ECG. ? ? Date: 09/01/2021 ? EKG Time: 14:51 ? Rate: 58 ? Rhythm: normal sinus rhythm ? Axis: Normal ? Intervals:nonspecific intraventricular conduction delay ? ST&T Change: None ? ?RADIOLOGY ?Chest x-ray reviewed and interpreted by me with mild edema  noted, no focal infiltrates or effusions noted. ? ?PROCEDURES: ? ?Critical Care performed: No ? ?Procedures ? ? ?MEDICATIONS ORDERED IN ED: ?Medications  ?ondansetron (ZOFRAN) injection 4 mg (has no administration in time range)  ? ? ? ?IMPRESSION / MDM / ASSESSMENT AND PLAN / ED COURSE  ?I reviewed the triage vital signs and the nursing notes. ?             ?               ? ?71 y.o. male with past medical history of hypertension, CAD status post CABG, CHF, sinus bradycardia, COPD, PE, and ulcerative colitis who presents to the ED complaining of feeling dizzy and lightheaded since around noon today with low blood pressure when he checked at home. ? ?Differential diagnosis includes, but is not limited to,  arrhythmia, ACS, PE, pneumonia, sepsis, UTI, dehydration, electrolyte abnormality, orthostasis. ? ?Patient nontoxic-appearing and in no acute distress, initial vital signs concerning for mild bradycardia with low blood pressure, however once this is rechecked in the room his pressure appears to have normalized with him laying flat.  He states this is a common occurrence for him and there are no findings on EKG to suggest ACS, does complain of chest pain but states this is common for him.  He has no symptoms to suggest infection or sepsis, we will screen chest x-ray and UA.  Additional labs including CBC, CMP, and troponin are pending. ? ?Labs are reassuring with CBC showing no anemia or leukocytosis, BMP without electrolyte abnormality or AKI, troponin within normal limits. Patient's blood pressure has remained normal since he was brought back to a room and he states he is now feeling better other than some nausea. We will give dose of IV zofran, but he is otherwise appropriate for discharge home. With borderline bradycardia and low blood pressure, he was instructed to hold his carvedilol until he is seen by his PCP or cardiologist. He was counseled to return to the ED for new or worsening symptoms, patient agrees with plan. ? ?  ? ? ?FINAL CLINICAL IMPRESSION(S) / ED DIAGNOSES  ? ?Final diagnoses:  ?Hypotension, unspecified hypotension type  ? ? ? ?Rx / DC Orders  ? ?ED Discharge Orders   ? ? None  ? ?  ? ? ? ?Note:  This document was prepared using Dragon voice recognition software and may include unintentional dictation errors. ?  ?Blake Divine, MD ?09/01/21 1525 ? ?

## 2021-09-05 ENCOUNTER — Other Ambulatory Visit: Payer: Self-pay | Admitting: Cardiovascular Disease

## 2021-09-10 ENCOUNTER — Other Ambulatory Visit: Payer: Self-pay | Admitting: Medical

## 2021-09-11 ENCOUNTER — Ambulatory Visit: Payer: Medicare HMO | Admitting: Cardiovascular Disease

## 2021-09-11 ENCOUNTER — Inpatient Hospital Stay: Admit: 2021-09-11 | Payer: Medicare HMO | Admitting: Surgery

## 2021-09-11 ENCOUNTER — Encounter: Payer: Self-pay | Admitting: Cardiovascular Disease

## 2021-09-11 VITALS — BP 124/90 | HR 64 | Ht 70.0 in | Wt 206.4 lb

## 2021-09-11 DIAGNOSIS — E785 Hyperlipidemia, unspecified: Secondary | ICD-10-CM

## 2021-09-11 DIAGNOSIS — I10 Essential (primary) hypertension: Secondary | ICD-10-CM

## 2021-09-11 DIAGNOSIS — I25118 Atherosclerotic heart disease of native coronary artery with other forms of angina pectoris: Secondary | ICD-10-CM

## 2021-09-11 DIAGNOSIS — I5022 Chronic systolic (congestive) heart failure: Secondary | ICD-10-CM

## 2021-09-11 DIAGNOSIS — Z01810 Encounter for preprocedural cardiovascular examination: Secondary | ICD-10-CM

## 2021-09-11 SURGERY — COLECTOMY, SIGMOID, ROBOT-ASSISTED
Anesthesia: General

## 2021-09-11 NOTE — Patient Instructions (Signed)
Medication Instructions:  Your physician recommends that you continue on your current medications as directed. Please refer to the Current Medication list given to you today.   HOLD Plavix and Asprin 5 days prior to surgery.  *If you need a refill on your cardiac medications before your next appointment, please call your pharmacy*   Lab Work: None ordered If you have labs (blood work) drawn today and your tests are completely normal, you will receive your results only by: [place] (if you have MyChart) OR A paper copy in the mail If you have any lab test that is abnormal or we need to change your treatment, we will call you to review the results.   Testing/Procedures: None ordered   Follow-Up: At [facility], you and your health needs are our priority.  As part of our continuing mission to provide you with exceptional heart care, we have created designated Provider Care Teams.  These Care Teams include your primary Cardiologist (physician) and Advanced Practice Providers (APPs -  Physician Assistants and Nurse Practitioners) who all work together to provide you with the care you need, when you need it.  We recommend signing up for the patient portal called "MyChart".  Sign up information is provided on this After Visit Summary.  MyChart is used to connect with patients for Virtual Visits (Telemedicine).  Patients are able to view lab/test results, encounter notes, upcoming appointments, etc.  Non-urgent messages can be sent to your provider as well.   To learn more about what you can do with MyChart, go to [email or web address].    Your next appointment:   3 month(s)  The format for your next appointment:   In Person  Provider:   You may see [name], MD or one of the following Advanced Practice Providers on your designated Care Team:   Murray Hodgkins, NP Christell Faith, PA-C Cadence Kathlen Mody, [place]   Other Instructions N/A  Important Information About  [place]

## 2021-09-11 NOTE — Progress Notes (Unsigned)
Cardiology Office Note   Date:  09/11/2021   ID:  Willie Clark, DOB 09-30-1950, MRN [id number]  PCP:  Willie Clark  Cardiologist:   Willie Sacramento, MD   Chief Complaint  Patient presents with   Other    Pre-op c/o pain and would like to discuss blood thinner. Meds reviewed verbally with pt.      History of Present Illness: Willie Clark is a 71 y.o. male who presents for regarding coronary artery disease and chronic systolic heart failure.  He has known history of coronary artery disease. He is status post coronary artery bypass graft surgery in 1993 and redo in 2007.   He suffers from fibromyalgia and chronic pain.  He had GI symptoms in 2015 due to documented gastric ulcer. He had neck surgery in 2409 without complications. He has known history of chronic neck pain with 2 previous cervical spine surgeries.    He was hospitalized in August of 2021with chest pain.  Echocardiogram showed an EF of 40 to 45%.  Cardiac catheterization showed severe underlying three-vessel coronary arteries with patent grafts and no significant change since 2017.  The patient antihypertensive medications were switched to losartan and carvedilol.  Rosuvastatin was increased to 40 mg daily. Losartan was subsequently switched to [facility].  However, the patient had allergic reaction with rash and itching that had to be treated in the emergency room.  Spironolactone was subsequently added.  He was seen on March in the office with symptoms of unstable angina.  His EKG was worrisome for inferior ST elevation myocardial infarction and thus he was transferred emergently to the Cath Lab where he underwent cardiac catheterization which showed severe native three-vessel coronary artery disease with patent left circumflex stent with mild to moderate in-stent restenosis, widely patent LIMA to LAD, patent SVG to OM 2 with 60 to 70% proximal graft stenosis and subtotally occluded SVG to RCA with long  segment of proximal graft disease and 99% stenosis at the distal anastomosis.  He underwent successful PCI to SVG to RCA with overlapping resolute Onyx drug-eluting stents. Echocardiogram during that admission showed an EF of 35 to 40%.    He was hospitalized in July with increased shortness of breath and chest pain.   Cardiac catheterization was performed which showed significant underlying three-vessel coronary artery disease with patent left circumflex stent with moderate in-stent restenosis, patent LIMA to LAD, patent SVG to right PDA with patent stents and patent SVG to OM with sequential 75% and 60% ostial and mid graft stenoses.  Right heart catheterization showed mildly elevated filling pressures with mildly reduced cardiac output.  PCI with 2 nonoverlapping drug-eluting stent placement was done to SVG to OM.  He continued to have shortness of breath after the procedure.  Thus, during last visit, I switched him from ticagrelor to clopidogrel.  [name] was added and was subsequently switched to [facility] for better coverage.  He has been having issues with recurrent diverticulitis most recently complicated by bacteremia that required prolonged antibiotics.  Since then, he experienced lower abdominal pain with associated nausea and occasional vomiting.  Due to that, he is being evaluated for left hemicolectomy.  The surgery was supposed to be done today but was canceled given that he is on dual antiplatelet therapy. He denies chest pain but continues to have exertional dyspnea.    Past Medical History:  Diagnosis Date   Abnormal stress test    Anxiety    Arthritis  Barrett's esophagus    Bowel obstruction ([place]) 02/2009   small bowel   CHF (congestive heart failure) (HCC)    Chronic chest pain    Coronary artery disease    a. 1993 CABG; b. 2007 Redo CABG; c. 09/2015 Cath: LM mild dzs, LAD 100ost, RI mild dzs, LCX 60p, OM1 100, RCA 100ost, VG->OM2 min irregs, LIMA->LAD nl, VG->RPDA 30ost,  20d, nl EF->Med rx.   Depression    Diverticulitis    Diverticulosis    Erectile dysfunction    Fibromyalgia    Gastric ulcer    Gastritis    GERD (gastroesophageal reflux disease)    Headache    Hemorrhoids    HFrEF (heart failure with reduced ejection fraction) ([place])    a. 09/2016 Echo: EF 55-60%, no rwma, triv AI, mild MR, mildly dil LA. -> as of April 2020: EF 40 to 45%.  Septal dyssynergy/hypokinesis due to postop state but otherwise unable to assess wall motion [name] due to poor study.   History of myocardial infarct at age less than 52 years    Hyperlipidemia    Hypertension    Kidney stones    going to [facility]  June 26 to see kidney specialist   Lightheadedness    Mass on back    Nephrolithiasis    Orthostatic hypotension    a. Improved after discontinuation of metoprolol.   OSA (obstructive sleep apnea)    a. On CPAP.   Peyronie disease    Pulmonary embolism ([place]) 01/2006   Post-op/treated   Skin lesions, generalized    Stroke [facility]) 1993   right side weakness no blood thinners   on Aspirin   Syncope and collapse    TIA (transient ischemic attack) 08/13/2014   Urticaria     Past Surgical History:  Procedure Laterality Date   BACK SURGERY     bowel obstruction  01/2009   CARDIAC CATHETERIZATION  11/2006   patent grafts. Significant OM3 disease and occluded diagonals.   CARDIAC CATHETERIZATION  06/2010   patent grafts. significant ISR in proximal LCX but OM2 is bypassed and gives retrograde flow to OM3.    CARDIAC CATHETERIZATION  06/2013   [facility]: Patent grafts with 60% proximal in-stent restenosis in the left circumflex with FFR of 0.85   CARDIAC CATHETERIZATION Left 09/25/2015   Procedure: Left Heart Cath and Cors/Grafts Angiography;  Surgeon: Willie Hampshire, MD;  Location: [place] CV LAB;  Service: Cardiovascular;  Laterality: Left;   CERVICAL FUSION     CHOLECYSTECTOMY     COLON SURGERY  01/2009   BOWEL RESECTION DUE TO SMALL BOWEL OBSTRUCTION    COLONOSCOPY     COLONOSCOPY WITH PROPOFOL N/A 10/28/2017   Procedure: COLONOSCOPY WITH PROPOFOL;  Surgeon: Willie Sails, MD;  Location: [facility] ENDOSCOPY;  Service: Endoscopy;  Laterality: N/A;   COLONOSCOPY WITH PROPOFOL N/A 07/30/2021   Procedure: COLONOSCOPY WITH PROPOFOL;  Surgeon: Lesly Rubenstein, MD;  Location: [facility] ENDOSCOPY;  Service: Endoscopy;  Laterality: N/A;   CORONARY ANGIOPLASTY  2006   CORONARY ARTERY BYPASS GRAFT  1993/01/2006   redo at [facility]. LIMA to LAD, SVG to OM2 and SVG to RPDA   CORONARY STENT INTERVENTION N/A 06/20/2020   Procedure: CORONARY STENT INTERVENTION;  Surgeon: Nelva Bush, MD;  Location: [place] CV LAB;  Service: Cardiovascular;  Laterality: N/A;   CORONARY STENT INTERVENTION N/A 11/15/2020   Procedure: CORONARY STENT INTERVENTION;  Surgeon: Nelva Bush, MD;  Location: [place] CV LAB;  Service: Cardiovascular;  Laterality: N/A;   CYSTOSCOPY WITH STENT PLACEMENT Bilateral 09/24/2015   Procedure: CYSTOSCOPY WITH BILATERAL RETROGRADES, BILATERAL STENT PLACEMENT;  Surgeon: Festus Aloe, MD;  Location: [facility] ORS;  Service: Urology;  Laterality: Bilateral;   ESOPHAGOGASTRODUODENOSCOPY N/A 10/25/2014   Procedure: ESOPHAGOGASTRODUODENOSCOPY (EGD);  Surgeon: Willie Sails, MD;  Location: [facility] ENDOSCOPY;  Service: Endoscopy;  Laterality: N/A;   ESOPHAGOGASTRODUODENOSCOPY (EGD) WITH PROPOFOL N/A 02/03/2017   Procedure: ESOPHAGOGASTRODUODENOSCOPY (EGD) WITH PROPOFOL;  Surgeon: Willie Sails, MD;  Location: [facility] ENDOSCOPY;  Service: Endoscopy;  Laterality: N/A;   ESOPHAGOGASTRODUODENOSCOPY (EGD) WITH PROPOFOL N/A 04/18/2017   Procedure: ESOPHAGOGASTRODUODENOSCOPY (EGD) WITH PROPOFOL;  Surgeon: Willie Sails, MD;  Location: [facility] ENDOSCOPY;  Service: Endoscopy;  Laterality: N/A;   EYE SURGERY Bilateral    Cataract Extraction with IOL   HERNIA REPAIR     INGUINAL HERNIA REPAIR Bilateral 01/23/2016   Procedure: HERNIA REPAIR INGUINAL ADULT  BILATERAL;  Surgeon: Leonie Green, MD;  Location: [facility] ORS;  Service: General;  Laterality: Bilateral;   LEFT HEART CATH AND CORS/GRAFTS ANGIOGRAPHY N/A 12/13/2019   Procedure: LEFT HEART CATH AND CORS/GRAFTS ANGIOGRAPHY;  Surgeon: Willie Hampshire, MD;  Location: [place] CV LAB;  Service: Cardiovascular;  Laterality: N/A;   LEFT HEART CATH AND CORS/GRAFTS ANGIOGRAPHY N/A 06/20/2020   Procedure: LEFT HEART CATH AND CORS/GRAFTS ANGIOGRAPHY;  Surgeon: Nelva Bush, MD;  Location: [place] [facility] LAB;  Service: Cardiovascular;  Laterality: N/A;   LUMBAR LAMINECTOMY/DECOMPRESSION MICRODISCECTOMY Left 10/01/2016   Procedure: Left Lumbar four-five Laminotomy for resection of synovial cyst;  Surgeon: Erline Levine, MD;  Location: [place];  Service: Neurosurgery;  Laterality: Left;  left   NECK SURGERY  06/2009   RIGHT/LEFT HEART CATH AND CORONARY/GRAFT ANGIOGRAPHY N/A 11/15/2020   Procedure: RIGHT/LEFT HEART CATH AND CORONARY/GRAFT ANGIOGRAPHY;  Surgeon: Nelva Bush, MD;  Location: [place] CV LAB;  Service: Cardiovascular;  Laterality: N/A;   SHOULDER SURGERY  2010     Current Outpatient Medications  Medication Sig Dispense Refill   aspirin EC 81 MG EC tablet Take 1 tablet (81 mg total) by mouth daily. Swallow whole. 30 tablet 11   citalopram (CELEXA) 20 MG tablet Take 20 mg by mouth daily.      clopidogrel (PLAVIX) 75 MG tablet TAKE (1) TABLET 75 MG DAILY. 90 tablet 3   dicyclomine (BENTYL) 10 MG capsule TAKE 1 CAPSULE THREE TIMES DAILY AS NEEDED     docusate calcium (SURFAK) 240 MG capsule Take 240 mg by mouth daily.     empagliflozin (JARDIANCE) 10 MG TABS tablet Take 1 tablet (10 mg total) by mouth daily before breakfast. 90 tablet 3   EPINEPHrine 0.3 mg/0.3 mL IJ SOAJ injection Inject 0.3 mg into the muscle as needed.      fexofenadine (ALLEGRA) 180 MG tablet Take 180 mg by mouth daily.     finasteride (PROSCAR) 5 MG tablet Take 5 mg by mouth daily.     gabapentin  (NEURONTIN) 800 MG tablet Take 1 tablet by mouth 3 (three) times daily.     isosorbide mononitrate (IMDUR) 60 MG 24 hr tablet TAKE 1 TABLET EVERY DAY 90 tablet 3   Multiple Vitamins-Minerals (HAIR SKIN AND NAILS FORMULA PO) Take 1 capsule by mouth daily.     nitroGLYCERIN (NITROSTAT) 0.4 MG SL tablet Place 1 tablet (0.4 mg total) under the tongue every 5 (five) minutes as needed for chest pain. 90 tablet 1   nystatin-triamcinolone ointment (MYCOLOG) Apply 1 application topically 2 (two) times daily. 30 g  0   ondansetron (ZOFRAN) 4 MG tablet Take 4 mg by mouth every 8 (eight) hours as needed for nausea or vomiting.     oxyCODONE (OXY IR/ROXICODONE) 5 MG immediate release tablet 0.5-1 mg every 6 (six) hours as needed.     pantoprazole (PROTONIX) 40 MG tablet Take 40 mg by mouth 2 (two) times daily.      Peppermint Oil (IBGARD PO) Take by mouth daily at 2 am.     potassium chloride SA (KLOR-CON M20) 20 MEQ tablet Take 1 tablet (20 mEq total) by mouth daily. 90 tablet 0   Probiotic Product (PROBIOTIC PO) Take by mouth daily at 8 pm.     rOPINIRole (REQUIP) 1 MG tablet Take 1 mg by mouth 2 (two) times daily.     rosuvastatin (CRESTOR) 40 MG tablet TAKE 1 TABLET EVERY DAY ( DOSE INCREASE ) 90 tablet 0   spironolactone (ALDACTONE) 25 MG tablet TAKE 1 TABLET EVERY DAY 90 tablet 1   torsemide (DEMADEX) 20 MG tablet Take 2 tablets (40 mg total) by mouth daily. 180 tablet 2   traMADol (ULTRAM) 50 MG tablet Take 50 mg by mouth every 6 (six) hours as needed.     VASCEPA 1 g capsule TAKE 2 CAPSULES TWICE DAILY 360 capsule 1   vitamin B-12 (CYANOCOBALAMIN) 1000 MCG tablet Take 1,000 mcg by mouth daily.     carvedilol (COREG) 3.125 MG tablet TAKE 1 TABLET (3.125 MG TOTAL) BY MOUTH 2 (TWO) TIMES DAILY WITH A MEAL. (Patient not taking: Reported on 09/11/2021) 180 tablet 0   losartan (COZAAR) 25 MG tablet TAKE 1 TABLET EVERY DAY (Patient not taking: Reported on 09/11/2021) 30 tablet 0   No current  facility-administered medications for this visit.   Facility-Administered Medications Ordered in Other Visits  Medication Dose Route Frequency Provider Last Rate Last Admin   indocyanine green (IC-GREEN) injection 5 mg  5 mg Intravenous Once Sakai, Isami, DO        Allergies:   Amitriptyline, Cyclobenzaprine, Dicyclomine, Sacubitril-valsartan, Amitriptyline hcl, Bacon flavor, Hydrocodone, Fish oil, Lidocaine, Omega-3 fatty acids-vitamin e, Other, Oxycodone-acetaminophen, and Sulfa antibiotics    Social History:  The patient  reports that he quit smoking about 30 years ago. His smoking use included cigarettes. He smoked an average of 2.5 packs per day. He has never used smokeless tobacco. He reports that he does not currently use alcohol. He reports that he does not use drugs.   Family History:  The patient's family history includes Breast cancer in his paternal aunt; Heart attack in his father; Heart disease (age of onset: 17) in his father.    ROS:  Please see the history of present illness.   Otherwise, review of systems are positive for none.   All other systems are reviewed and negative.    PHYSICAL EXAM: VS:  BP 124/90 (BP Location: Left Arm, Patient Position: Sitting, Cuff Size: Normal)   Pulse 64   Ht '5\' 10"'$  (1.778 m)   Wt 206 lb 6 oz (93.6 kg)   SpO2 96%   BMI 29.61 kg/m  , BMI Body mass index is 29.61 kg/m. GEN: Well nourished, well developed, in no acute distress  HEENT: normal  Neck: no JVD, carotid bruits, or masses Cardiac: RRR; no murmurs, rubs, or gallops,no edema  Respiratory:  clear to auscultation bilaterally, normal work of breathing GI: soft, nontender, nondistended, + BS MS: no deformity or atrophy  Skin: warm and dry, no rash Neuro:  Strength and sensation are  intact Psych: euthymic mood, full affect No radial hematoma  EKG:  EKG is not ordered today.   Recent Labs: 11/14/2020: TSH 2.375 06/01/2021: ALT 22; B Natriuretic Peptide 90.0 06/02/2021:  Magnesium 1.8 09/01/2021: BUN 12; Creatinine, Ser 0.93; Hemoglobin 13.5; Platelets 184; Potassium 3.9; Sodium 138    Lipid Panel    Component Value Date/Time   CHOL 165 02/07/2020 0658   TRIG 317 (H) 02/07/2020 0658   HDL 41 02/07/2020 0658   CHOLHDL 4.0 02/07/2020 0658   VLDL 63 (H) 02/07/2020 0658   LDLCALC 61 02/07/2020 0658      Wt Readings from Last 3 Encounters:  09/11/21 206 lb 6 oz (93.6 kg)  09/01/21 199 lb (90.3 kg)  08/17/21 204 lb 14.4 oz (92.9 kg)         ASSESSMENT AND PLAN:  1.  Coronary artery disease involving native coronary arteries with other forms of angina: Stable episodes of chest pain overall.   Chronic exertional dyspnea seems to be stable.  2.  Chronic systolic heart failure: Most recent echocardiogram in February showed improvement in ejection fraction to 45 to 50%.  He does complain of increased lower extremity edema but I am hesitant to increase torsemide above 40 mg daily.  He had recent hypotension and thus both carvedilol and losartan were held.  I will continue to hold for now and consider resuming in the near future.  Continue Jardiance and spironolactone. 3. Essential hypertension: Blood pressure is controlled.   4. Hyperlipidemia: Continue high-dose rosuvastatin and Vascepa.  Most recent lipid profile showed an LDL of 61 and triglyceride of 317.   5. Mild carotid disease: Previous carotid Doppler showed less than 40% stenosis bilaterally. No need to repeat.  6.  Small abdominal aortic aneurysm: This was noted on ultrasound yesterday measuring 3 cm.  Recommend repeat ultrasound in 2 years .   7.  Preop cardiovascular evaluation for sigmoid colectomy: I had a prolonged discussion with the patient and his wife about this complicated issue.  The patient had drug-eluting stent placements to SVG graft in July of last year.  Thus, he is at high risk for stent thrombosis with interruption of dual antiplatelet therapy.  I favor that he waits until July  to decrease the chance of that.  However, he feels very limited by his GI symptoms and wants to proceed with surgery even knowing that he is at increased risk.  I advised him to hold aspirin and clopidogrel 5 days before the surgery and resume after.   Disposition: Follow-up in 3 months.   Signed,  Willie Sacramento, MD  09/11/2021 3:41 PM    [place]

## 2021-09-12 ENCOUNTER — Ambulatory Visit: Payer: Self-pay | Admitting: Surgery

## 2021-09-12 NOTE — H&P (Signed)
Subjective:   CC: Diverticulitis of large intestine without perforation or abscess without bleeding [K57.32]   HPI:  Willie Clark is a 71 y.o. male who returns for above.  Recurrent pain in same LLQ area again, persistent N/V.   Colonoscopy showed TA, diverticulosis but no active infection, inflammation.  Pt and wife still wishing to proceed with resection     Past Medical History:  has a past medical history of Asthma without status asthmaticus, Barrett's esophagus (10/25/2014), Coronary disease, Depression, Diverticulosis (12/13/13), Erosive gastritis (10/25/2014), Gastric ulcer, Gastritis (12/13/13), GERD (gastroesophageal reflux disease), Hemorrhoids, Hyperlipidemia, Hypertension, Obstructive sleep apnea, Osteoarthritis, Pulmonary emboli (CMS-HCC), Reflux, and Sleep apnea.   Past Surgical History:  has a past surgical history that includes Bowel resection secondary to small bowel obstruction (01/2009); Cervical neck surgery (05/2009); Shoulder surgery (12/31/2007); colonoscopy (04/04/2006); colonoscopy (12/19/2009); egd (11/23/2008); egd (12/19/2009); egd (02/18/2012); egd (05/05/2012); egd (06/22/2012); egd (01/11/2013); Cataract extraction (2011); Stent in heart (2007); Coronary artery bypass graft (1993 and 2007); Shoulder surgery (08/10/2012); L-spine surgery (2011); egd (12/13/13); Colonoscopy (12/13/13); egd (10/25/2014); Inguinal hernia repair (Bilateral, 01/23/2016); egd (02/03/2017); Colonoscopy; Upper gastrointestinal endoscopy (2018); egd (04/18/2017); Colonoscopy (10/28/2017); esophagogastrodoudenoscopy w/biopsy (N/A, 01/23/2018); and esophagogastrodoudenoscopy w/biopsy (N/A, 05/29/2018).   Family History: family history includes Cancer in his brother; Diabetes type II in his mother; Gallbladder disease in his father; Heart disease in his brother and daughter; High blood pressure (Hypertension) in his father and mother; Liver disease in his father; Lung cancer in his father; Myocardial  Infarction (Heart attack) in his father and mother; Rheum arthritis in his mother; Stroke in his mother; Ulcers in his father.   Social History:  reports that he quit smoking about 30 years ago. His smoking use included cigarettes. He has a 40.00 pack-year smoking history. He has never used smokeless tobacco. He reports current alcohol use. He reports that he does not use drugs.   Current Medications: has a current medication list which includes the following prescription(s): acetaminophen, albuterol, aspirin, carvedilol, citalopram, clopidogrel, cyanocobalamin, dapagliflozin, dicyclomine, docusate, epinephrine, fexofenadine, finasteride, furosemide, gabapentin, hydrocortisone, indapamide, isosorbide mononitrate, isosorbide mononitrate, jardiance, losartan, methylcellulose, multivitamin with minerals, nitroglycerin, ondansetron, oxycodone, pantoprazole, potassium chloride, psyllium, psyllium husk, ropinirole, ropinirole, rosuvastatin, sodium, potassium, and magnesium, spironolactone, torsemide, triamcinolone, mesalamine, metronidazole, naltrexone hcl, neomycin, pregabalin, and vascepa.   Allergies:  Allergies Allergen Reactions  Amitriptyline Other (See Comments)  Bentyl [Dicyclomine] Shortness Of Breath and Swelling  Cyclobenzaprine Hives  Entresto [Sacubitril-Valsartan] Itching  Fish Derived Itching  Fish Oil [Omega-3 Fatty Acids-Vitamin E] Itching  Lidocaine Nausea and Dizziness  Other Unknown     Onions, bacon  Oxycodone-Acetaminophen Other (See Comments)     Lowers BP     ROS:  A 15 point review of systems was performed and pertinent positives and negatives noted in HPI   Objective:   BP 96/60   Pulse 55   Ht 175.3 cm ('5\' 9"'$ ) Comment: Per chart  Wt 92.5 kg (204 lb) Comment: Per chart  BMI 30.13 kg/m    Constitutional :  No distress, cooperative, alert Lymphatics/Throat:  Supple with no lymphadenopathy Respiratory:  Clear to auscultation bilaterally Cardiovascular:  Regular  rate and rhythm Gastrointestinal: Soft, TTP minor, all four quadrants, with no specific quadrant worse than other Musculoskeletal: Steady gait and movement Skin: Cool and moist, no surgical scars Psychiatric: Normal affect, non-agitated, not confused       LABS:        RADS: CLINICAL DATA:  Acute abdominal pain in a 71 year old male.  EXAM:  CT ABDOMEN AND PELVIS WITH CONTRAST   TECHNIQUE:  Multidetector CT imaging of the abdomen and pelvis was performed  using the standard protocol following bolus administration of  intravenous contrast.   RADIATION DOSE REDUCTION: This exam was performed according to the  departmental dose-optimization program which includes automated  exposure control, adjustment of the mA and/or kV according to  patient size and/or use of iterative reconstruction technique.   CONTRAST:  56m OMNIPAQUE IOHEXOL 350 MG/ML SOLN   COMPARISON:  March 06, 2021.   FINDINGS:  Lower chest: Basilar atelectasis without effusion or sign of  consolidation.   Hepatobiliary: Paste that   Pancreas: Normal, without mass, inflammation or ductal dilatation.   Spleen: Normal size and contour.   Adrenals/Urinary Tract:   Adrenal glands are unremarkable. Symmetric renal enhancement. No  sign of hydronephrosis. No suspicious renal lesion or perinephric  stranding.   Urinary bladder is grossly unremarkable.   Small cysts in the bilateral kidneys.   Punctate calculi in the interpolar LEFT and RIGHT kidney may  approach 3 mm greatest size, 2 on the LEFT and a single calculus on  the RIGHT. No signs of ureteral calculus or perivesical stranding.   Stomach/Bowel: Small hiatal hernia. No perigastric strain. No sign  of small bowel obstruction or inflammation of the small bowel.   Normal appendix.   Pancolonic diverticulosis which is worse at the distal descending  and sigmoid colon. Focal inflammation extending from a diverticulum  at the descending/sigmoid  junction with adjacent fascial thickening  and pericolonic stranding extending from this area. No signs of  abscess. No free intraperitoneal air.   Vascular/Lymphatic:   Aortic atherosclerosis. No sign of aneurysm. Smooth contour of the  IVC. There is no gastrohepatic or hepatoduodenal ligament  lymphadenopathy. No retroperitoneal or mesenteric lymphadenopathy.   No pelvic sidewall lymphadenopathy.   Reproductive: Unremarkable by CT.   Other: Small fat containing umbilical hernia.  No ascites.   Musculoskeletal: No acute bone finding. No destructive bone process.  Spinal degenerative changes.   IMPRESSION:  1. Acute uncomplicated diverticulitis at the descending/sigmoid  junction.  2. Bilateral nephrolithiasis.  3. Moderate hepatic steatosis similar to the prior study.  4. Small hiatal hernia.  5. Small fat containing umbilical hernia.       Assessment:      Diverticulitis of large intestine without perforation or abscess without bleeding [K57.32]    CAD s/p CABG, mult stents in past on plavix Hx of PE per chart review OSA   Pt is high risk to undergo any major abdominal procedures, but patient specifically stated he is "willing to die" to resolve the pain issues.  I specifically stated nausea/diarrhea episodes are unlikely to resolve proceeding with left hemicolectomy, and that the smaller diverticulosis on the right will have potential to become inflammed in the future.  I cannot recommend full colectomy at this point since there is only minimal diverticulosis on right based on CT.  Left hemicolectomy will likely prevent future acute episodes requiring hospitalization.     Wife and pt both verbalized understanding.  Still wishes to proceed with robotic assisted laparoscopic left hemicolectomy, with hopes of improvement at least for the pain.  They also understand diarrhea may worsen after surgery.   Plan:    The risk of laparoscopic colon resection surgery includes, but  not limited to, recurrence, bleeding, chronic pain, post-op infxn, post-op SBO or ileus, hernias, resection of bowel, re-anastamosis, possible ostomy placement and need for re-operation to address  said risks. The risks of general anesthetic, if used, includes MI, CVA, sudden death or even reaction to anesthetic medications also discussed. Alternatives include continued observation.  Benefits include possible symptom relief, preventing further decline in health and possible death.   Typical post-op recovery time of additional days in hospital for observation afterwards also discussed.   Cardiology recommended stopping plavix/aspirin 5 days prior to surgery   Ostomy nurse to mark site of possible ostomy prior to surgery   Will also ask urologist to place Icg to visualize ureter during the surgery.   labs/images/medications/previous chart entries reviewed personally and relevant changes/updates noted above.

## 2021-09-13 ENCOUNTER — Ambulatory Visit: Payer: Medicare HMO | Admitting: Cardiovascular Disease

## 2021-09-19 ENCOUNTER — Other Ambulatory Visit: Payer: Self-pay

## 2021-10-05 ENCOUNTER — Other Ambulatory Visit: Payer: Self-pay

## 2021-10-05 ENCOUNTER — Encounter
Admission: RE | Admit: 2021-10-05 | Discharge: 2021-10-05 | Disposition: A | Discharge status: Home / Self Care | Payer: Medicare HMO | Source: Ambulatory Visit | Attending: Surgery | Admitting: Surgery

## 2021-10-05 ENCOUNTER — Telehealth: Payer: Self-pay | Admitting: *Deleted

## 2021-10-05 HISTORY — DX: Acute myocardial infarction, unspecified: I21.9

## 2021-10-05 HISTORY — DX: Chronic obstructive pulmonary disease, unspecified: J44.9

## 2021-10-05 HISTORY — DX: Angina pectoris, unspecified: I20.9

## 2021-10-05 NOTE — Telephone Encounter (Signed)
Request for pre-operative cardiac clearance Received: Today Willie Kitchens, NP  P Cv Div Preop Callback Request for pre-operative cardiac clearance:   NOTE: Patient has been seen fore pre-operative clearance by Dr. Fletcher Anon. Clearance was discussed and patient was advised to wait until end of July. Patient does not wish to do so. He is "willing to take the risk even if he dies on the table" given current GI symptoms. MD mentions this to some degree in note, however unclear if patient "cleared" to proceed at increased risk. Seeking clarification.   1. What type of surgery is being performed?  XI ROBOT ASSISTED LAPAROSCOPIC LEFT HEMI-COLECTOMY   2. When is this surgery scheduled?  10/15/2021     3. Type of clearance being requested (medical, pharmacy, both).  BOTH     4. Are there any medications that need to be held prior to surgery?  ASA + CLOPIDOGREL   5. Practice name and name of physician performing surgery?  Performing surgeon: Dr. Benjamine Sprague, DO  Requesting clearance: Honor Loh, FNP-C       6. Anesthesia type (none, local, MAC, general)?  GENERAL   7. What is the office phone and fax number?    Phone: [phone]  Fax: [phone]   ATTENTION: Unable to create telephone message as per your standard workflow. Directed by [facility] providers to send requests for cardiac clearance to this pool for appropriate distribution to provider covering pre-operative clearances.   Honor Loh, MSN, APRN, FNP-C, CEN  [facility]  Peri-operative Services Nurse Practitioner  Phone: [phone]  10/05/21 1:55 PM

## 2021-10-05 NOTE — Patient Instructions (Addendum)
Your procedure is scheduled on: 10/15/21 - Monday Report to the Registration Desk on the 1st floor of the Post. To find out your arrival time, please call [phone] between 1PM - 3PM on: 10/12/21 - Friday If your arrival time is 6:00 am, do not arrive prior to that time as the [facility] entrance doors do not open until 6:00 am.  REMEMBER: Instructions that are not followed completely may result in serious medical risk, up to and including death; or upon the discretion of your surgeon and anesthesiologist your surgery may need to be rescheduled.  - Please take medications as instructed and follow instructions for the bowel prep as instructed by Dr. Lysle Pearl. neomycin 500 mg tablet   Take 2 tablets at 2pm, 3pm, and 10pm the day before surgery       metroNIDAZOLE (FLAGYL) 500 MG tablet   Take 2 tablets at 2pm, 3pm, and 10pm the day before surgery        - Ostomy nurse to mark site of possible ostomy prior to surgery - 10/12/21 at 10 am at the [facility] [address].   TAKE THESE MEDICATIONS THE MORNING OF SURGERY WITH A SIP OF WATER:  - citalopram (CELEXA) 20 MG tablet - gabapentin (NEURONTIN) 800 MG tablet - isosorbide mononitrate (IMDUR) 60 MG 24 hr tablet - pantoprazole (PROTONIX) 40 MG tablet, (take one the night before and one on the morning of surgery - helps to prevent nausea after surgery.) - rOPINIRole (REQUIP) 1 MG tablet - VASCEPA 1 g capsule   - Stop taking empagliflozin (JARDIANCE) 10 MG TABS tablet beginning 10/12/21, may resume the day after your procedure.  - STOP taking Aspirin and Plavix beginning 10/12/21.  One week prior to surgery: Stop Anti-inflammatories (NSAIDS) such as Advil, Aleve, Ibuprofen, Motrin, Naproxen, Naprosyn and Aspirin based products such as Excedrin, Goodys Powder, BC Powder.  Stop ANY OVER THE COUNTER supplements until after surgery - Multiple Vitamins-Minerals (HAIR SKIN AND NAILS FORMULA PO).  You may take Tylenol if  needed for pain up until the day of surgery.  No Alcohol for 24 hours before or after surgery.  No Smoking including e-cigarettes for 24 hours prior to surgery.  No chewable tobacco products for at least 6 hours prior to surgery.  No nicotine patches on the day of surgery.  Do not use any "recreational" drugs for at least a week prior to your surgery.  Please be advised that the combination of cocaine and anesthesia may have negative outcomes, up to and including death. If you test positive for cocaine, your surgery will be cancelled.  On the morning of surgery brush your teeth with toothpaste and water, you may rinse your mouth with mouthwash if you wish. Do not swallow any toothpaste or mouthwash.  Use CHG Soap or wipes as directed on instruction sheet.  Do not wear jewelry, make-up, hairpins, clips or nail polish.  Do not wear lotions, powders, or perfumes.   Do not shave body from the neck down 48 hours prior to surgery just in case you cut yourself which could leave a site for infection.  Also, freshly shaved skin may become irritated if using the CHG soap.  Contact lenses, hearing aids and dentures may not be worn into surgery.  Do not bring valuables to the hospital. [facility] is not responsible for any missing/lost belongings or valuables.   Bring your C-PAP to the hospital with you in case you may have to spend the night.  Notify your doctor if there is any change in your medical condition (cold, fever, infection).  Wear comfortable clothing (specific to your surgery type) to the hospital.  After surgery, you can help prevent lung complications by doing breathing exercises.  Take deep breaths and cough every 1-2 hours. Your doctor may order a device called an Incentive Spirometer to help you take deep breaths. When coughing or sneezing, hold a pillow firmly against your incision with both hands. This is called "splinting." Doing this helps protect your incision. It  also decreases belly discomfort.  If you are being admitted to the hospital overnight, leave your suitcase in the car. After surgery it may be brought to your room.  If you are being discharged the day of surgery, you will not be allowed to drive home. You will need a responsible adult (18 years or older) to drive you home and stay with you that night.   If you are taking public transportation, you will need to have a responsible adult (18 years or older) with you. Please confirm with your physician that it is acceptable to use public transportation.   Please call the [facility] at [phone] if you have any questions about these instructions.  Surgery Visitation Policy:  Patients undergoing a surgery or procedure may have two family members or support persons with them as long as the person is not COVID-19 positive or experiencing its symptoms.   Inpatient Visitation:    Visiting hours are 7 a.m. to 8 p.m. Up to four visitors are allowed at one time in a patient room, including children. The visitors may rotate out with other people during the day. One designated support person (adult) may remain overnight.

## 2021-10-05 NOTE — Telephone Encounter (Signed)
Preoperative team, patient was seen and evaluated by Dr.Arida on 09/11/2021.  He was cleared for his surgery at that time.  Please forward this note to the requesting surgeon/office.  Thank you for your help.  Jossie Ng. Lilyonna Steidle NP-C    10/05/2021, 2:44 PM [address] [place] [address] Office [phone] Fax [phone]

## 2021-10-08 ENCOUNTER — Other Ambulatory Visit: Payer: Medicare HMO

## 2021-10-12 ENCOUNTER — Encounter
Admission: RE | Admit: 2021-10-12 | Discharge: 2021-10-12 | Disposition: A | Discharge status: Home / Self Care | Payer: Medicare HMO | Source: Ambulatory Visit | Attending: Surgery | Admitting: Surgery

## 2021-10-12 ENCOUNTER — Encounter: Payer: Self-pay | Admitting: Surgery

## 2021-10-12 ENCOUNTER — Other Ambulatory Visit: Payer: Medicare HMO

## 2021-10-12 LAB — TYPE AND SCREEN
ABO/RH(D): A POS
Antibody Screen: NEGATIVE

## 2021-10-12 NOTE — Consult Note (Signed)
WOC Nurse requested for preoperative stoma site marking  Discussed surgical procedure and stoma creation with patient.  Explained role of the WOC nurse team.  Provided the patient with educational booklet and provided samples of pouching options.  Answered patient questions.   Examined patient sitting in order to place the marking in the patient's visual field, away from any creases or abdominal contour issues and within the rectus muscle.  Attempted to mark above the patient's belt line.   Marked for colostomy in the LMQ  _5___ cm to the left of the umbilicus and even with the umbilicus.  Patient's abdomen cleansed with CHG wipes at site markings, allowed to air dry prior to marking.Covered mark with thin film transparent dressing to preserve mark until date of surgery.   WOC Nurse team will follow up with patient after surgery for continue ostomy care and teaching.  Helmut Muster, RN, MSN, CWOCN, CNS-BC, pager [phone]

## 2021-10-14 MED ORDER — ACETAMINOPHEN 500 MG PO TABS: 1000.0000 mg | ORAL_TABLET | ORAL | Status: AC

## 2021-10-14 MED ORDER — LACTATED RINGERS IV SOLN: INTRAVENOUS | Status: DC

## 2021-10-14 MED ORDER — CHLORHEXIDINE GLUCONATE 0.12 % MT SOLN: 15.0000 mL | Freq: Once | OROMUCOSAL | Status: AC

## 2021-10-14 MED ORDER — CELECOXIB 200 MG PO CAPS: 200.0000 mg | ORAL_CAPSULE | ORAL | Status: AC

## 2021-10-14 MED ORDER — SODIUM CHLORIDE 0.9 % IV SOLN
2.0000 g | INTRAVENOUS | Status: AC
  Administered 2021-10-15: 2 g via INTRAVENOUS

## 2021-10-14 MED ORDER — ORAL CARE MOUTH RINSE: 15.0000 mL | Freq: Once | OROMUCOSAL | Status: AC

## 2021-10-14 MED ORDER — CHLORHEXIDINE GLUCONATE CLOTH 2 % EX PADS
6.0000 | MEDICATED_PAD | Freq: Once | CUTANEOUS | Status: AC
  Administered 2021-10-15: 6 via TOPICAL

## 2021-10-15 ENCOUNTER — Other Ambulatory Visit: Payer: Self-pay

## 2021-10-15 ENCOUNTER — Telehealth: Payer: Self-pay | Admitting: Cardiovascular Disease

## 2021-10-15 ENCOUNTER — Inpatient Hospital Stay
Admission: RE | Admit: 2021-10-15 | Discharge: 2021-10-17 | DRG: 330 | Disposition: A | Discharge status: Home / Self Care | Payer: Medicare HMO | Attending: Surgery | Admitting: Surgery

## 2021-10-15 ENCOUNTER — Inpatient Hospital Stay: Payer: Medicare HMO | Admitting: Urgent Care

## 2021-10-15 ENCOUNTER — Encounter
Admission: RE | Disposition: A | Discharge status: Home / Self Care | Payer: Self-pay | Source: Home / Self Care | Attending: Surgery

## 2021-10-15 ENCOUNTER — Encounter: Payer: Self-pay | Admitting: Surgery

## 2021-10-15 DIAGNOSIS — I110 Hypertensive heart disease with heart failure: Secondary | ICD-10-CM | POA: Diagnosis present

## 2021-10-15 DIAGNOSIS — K449 Diaphragmatic hernia without obstruction or gangrene: Secondary | ICD-10-CM | POA: Diagnosis present

## 2021-10-15 DIAGNOSIS — G4733 Obstructive sleep apnea (adult) (pediatric): Secondary | ICD-10-CM | POA: Diagnosis present

## 2021-10-15 DIAGNOSIS — I5022 Chronic systolic (congestive) heart failure: Secondary | ICD-10-CM | POA: Diagnosis present

## 2021-10-15 DIAGNOSIS — Z8249 Family history of ischemic heart disease and other diseases of the circulatory system: Secondary | ICD-10-CM

## 2021-10-15 DIAGNOSIS — Z86711 Personal history of pulmonary embolism: Secondary | ICD-10-CM

## 2021-10-15 DIAGNOSIS — I2510 Atherosclerotic heart disease of native coronary artery without angina pectoris: Secondary | ICD-10-CM | POA: Diagnosis present

## 2021-10-15 DIAGNOSIS — Z01812 Encounter for preprocedural laboratory examination: Principal | ICD-10-CM

## 2021-10-15 DIAGNOSIS — J449 Chronic obstructive pulmonary disease, unspecified: Secondary | ICD-10-CM | POA: Diagnosis present

## 2021-10-15 DIAGNOSIS — Z882 Allergy status to sulfonamides status: Secondary | ICD-10-CM | POA: Diagnosis not present

## 2021-10-15 DIAGNOSIS — Z885 Allergy status to narcotic agent status: Secondary | ICD-10-CM

## 2021-10-15 DIAGNOSIS — M797 Fibromyalgia: Secondary | ICD-10-CM | POA: Diagnosis present

## 2021-10-15 DIAGNOSIS — Z888 Allergy status to other drugs, medicaments and biological substances status: Secondary | ICD-10-CM | POA: Diagnosis not present

## 2021-10-15 DIAGNOSIS — Z87891 Personal history of nicotine dependence: Secondary | ICD-10-CM

## 2021-10-15 DIAGNOSIS — Z8673 Personal history of transient ischemic attack (TIA), and cerebral infarction without residual deficits: Secondary | ICD-10-CM

## 2021-10-15 DIAGNOSIS — F32A Depression, unspecified: Secondary | ICD-10-CM | POA: Diagnosis present

## 2021-10-15 DIAGNOSIS — Z981 Arthrodesis status: Secondary | ICD-10-CM

## 2021-10-15 DIAGNOSIS — K9181 Other intraoperative complications of digestive system: Secondary | ICD-10-CM | POA: Diagnosis not present

## 2021-10-15 DIAGNOSIS — Z9102 Food additives allergy status: Secondary | ICD-10-CM

## 2021-10-15 DIAGNOSIS — K5732 Diverticulitis of large intestine without perforation or abscess without bleeding: Principal | ICD-10-CM | POA: Diagnosis present

## 2021-10-15 DIAGNOSIS — K5792 Diverticulitis of intestine, part unspecified, without perforation or abscess without bleeding: Secondary | ICD-10-CM | POA: Diagnosis present

## 2021-10-15 DIAGNOSIS — K219 Gastro-esophageal reflux disease without esophagitis: Secondary | ICD-10-CM | POA: Diagnosis present

## 2021-10-15 DIAGNOSIS — K529 Noninfective gastroenteritis and colitis, unspecified: Secondary | ICD-10-CM | POA: Diagnosis present

## 2021-10-15 DIAGNOSIS — I700 Atherosclerosis of aorta: Secondary | ICD-10-CM | POA: Diagnosis present

## 2021-10-15 DIAGNOSIS — E785 Hyperlipidemia, unspecified: Secondary | ICD-10-CM | POA: Diagnosis present

## 2021-10-15 DIAGNOSIS — K429 Umbilical hernia without obstruction or gangrene: Secondary | ICD-10-CM | POA: Diagnosis present

## 2021-10-15 DIAGNOSIS — Z955 Presence of coronary angioplasty implant and graft: Secondary | ICD-10-CM

## 2021-10-15 DIAGNOSIS — Z91018 Allergy to other foods: Secondary | ICD-10-CM

## 2021-10-15 DIAGNOSIS — Z7902 Long term (current) use of antithrombotics/antiplatelets: Secondary | ICD-10-CM | POA: Diagnosis not present

## 2021-10-15 DIAGNOSIS — Z951 Presence of aortocoronary bypass graft: Secondary | ICD-10-CM | POA: Diagnosis not present

## 2021-10-15 DIAGNOSIS — I252 Old myocardial infarction: Secondary | ICD-10-CM

## 2021-10-15 DIAGNOSIS — Z79899 Other long term (current) drug therapy: Secondary | ICD-10-CM | POA: Diagnosis not present

## 2021-10-15 DIAGNOSIS — Z7982 Long term (current) use of aspirin: Secondary | ICD-10-CM

## 2021-10-15 DIAGNOSIS — Z7984 Long term (current) use of oral hypoglycemic drugs: Secondary | ICD-10-CM

## 2021-10-15 HISTORY — DX: Obstructive sleep apnea (adult) (pediatric): G47.33

## 2021-10-15 HISTORY — DX: Atherosclerosis of aorta: I70.0

## 2021-10-15 HISTORY — DX: Arthrodesis status: Z98.1

## 2021-10-15 HISTORY — DX: Restless legs syndrome: G25.81

## 2021-10-15 HISTORY — DX: Long term (current) use of antithrombotics/antiplatelets: Z79.02

## 2021-10-15 LAB — ABO/RH: ABO/RH(D): A POS

## 2021-10-15 SURGERY — COLECTOMY, PARTIAL, ROBOT-ASSISTED, LAPAROSCOPIC
Anesthesia: General | Site: Abdomen

## 2021-10-15 MED ORDER — DEXAMETHASONE SODIUM PHOSPHATE 10 MG/ML IJ SOLN
INTRAMUSCULAR | Status: AC
  Filled 2021-10-15: qty 1

## 2021-10-15 MED ORDER — STERILE WATER FOR IRRIGATION IR SOLN
Status: DC | PRN
  Administered 2021-10-15: 1000 mL

## 2021-10-15 MED ORDER — INDOCYANINE GREEN 25 MG IV SOLR
INTRAVENOUS | Status: DC | PRN
  Administered 2021-10-15: 5 mg via INTRAVENOUS

## 2021-10-15 MED ORDER — BUPIVACAINE HCL (PF) 0.5 % IJ SOLN
INTRAMUSCULAR | Status: DC | PRN
  Administered 2021-10-15: 30 mL

## 2021-10-15 MED ORDER — FENTANYL CITRATE (PF) 250 MCG/5ML IJ SOLN
INTRAMUSCULAR | Status: AC
  Filled 2021-10-15: qty 5

## 2021-10-15 MED ORDER — ONDANSETRON HCL 4 MG/2ML IJ SOLN
INTRAMUSCULAR | Status: AC
  Filled 2021-10-15: qty 2

## 2021-10-15 MED ORDER — PROPOFOL 10 MG/ML IV BOLUS
INTRAVENOUS | Status: DC | PRN
  Administered 2021-10-15: 100 mg via INTRAVENOUS

## 2021-10-15 MED ORDER — INDOCYANINE GREEN 25 MG IV SOLR
INTRAVENOUS | Status: DC | PRN
  Administered 2021-10-15: 25 mg

## 2021-10-15 MED ORDER — SODIUM CHLORIDE 0.9 % IV SOLN
INTRAVENOUS | Status: AC
  Filled 2021-10-15: qty 2

## 2021-10-15 MED ORDER — ACETAMINOPHEN 500 MG PO TABS
ORAL_TABLET | ORAL | Status: AC
  Administered 2021-10-15: 1000 mg via ORAL
  Filled 2021-10-15: qty 2

## 2021-10-15 MED ORDER — LORATADINE 10 MG PO TABS
10.0000 mg | ORAL_TABLET | Freq: Every day | ORAL | Status: DC
  Administered 2021-10-16 – 2021-10-17 (×2): 10 mg via ORAL
  Filled 2021-10-15 (×2): qty 1

## 2021-10-15 MED ORDER — ROPINIROLE HCL 1 MG PO TABS
1.0000 mg | ORAL_TABLET | Freq: Two times a day (BID) | ORAL | Status: DC
  Administered 2021-10-16 – 2021-10-17 (×3): 1 mg via ORAL
  Filled 2021-10-15 (×3): qty 1

## 2021-10-15 MED ORDER — FENTANYL CITRATE (PF) 100 MCG/2ML IJ SOLN
INTRAMUSCULAR | Status: AC
  Filled 2021-10-15: qty 2

## 2021-10-15 MED ORDER — PROMETHAZINE HCL 25 MG/ML IJ SOLN: 6.2500 mg | INTRAMUSCULAR | Status: DC | PRN

## 2021-10-15 MED ORDER — DEXMEDETOMIDINE (PRECEDEX) IN NS 20 MCG/5ML (4 MCG/ML) IV SYRINGE
PREFILLED_SYRINGE | INTRAVENOUS | Status: DC | PRN
  Administered 2021-10-15 (×4): 8 ug via INTRAVENOUS
  Administered 2021-10-15 (×2): 4 ug via INTRAVENOUS
  Administered 2021-10-15: 8 ug via INTRAVENOUS
  Administered 2021-10-15: 4 ug via INTRAVENOUS
  Administered 2021-10-15 (×2): 8 ug via INTRAVENOUS

## 2021-10-15 MED ORDER — HYDROMORPHONE HCL 1 MG/ML IJ SOLN: 0.2500 mg | INTRAMUSCULAR | Status: DC | PRN

## 2021-10-15 MED ORDER — HYDROMORPHONE HCL 1 MG/ML IJ SOLN
0.5000 mg | INTRAMUSCULAR | Status: DC | PRN
  Filled 2021-10-15: qty 0.5

## 2021-10-15 MED ORDER — SODIUM CHLORIDE 0.9 % IV SOLN
INTRAVENOUS | Status: DC | PRN
  Administered 2021-10-15: 70 mL

## 2021-10-15 MED ORDER — NYSTATIN-TRIAMCINOLONE 100000-0.1 UNIT/GM-% EX OINT: 1.0000 | TOPICAL_OINTMENT | Freq: Two times a day (BID) | CUTANEOUS | Status: DC | PRN

## 2021-10-15 MED ORDER — TRAMADOL HCL 50 MG PO TABS
50.0000 mg | ORAL_TABLET | Freq: Four times a day (QID) | ORAL | Status: DC | PRN
  Administered 2021-10-15 – 2021-10-16 (×4): 50 mg via ORAL
  Filled 2021-10-15 (×4): qty 1

## 2021-10-15 MED ORDER — EPHEDRINE 5 MG/ML INJ
INTRAVENOUS | Status: AC
  Filled 2021-10-15: qty 10

## 2021-10-15 MED ORDER — IBUPROFEN 600 MG PO TABS: 600.0000 mg | ORAL_TABLET | Freq: Four times a day (QID) | ORAL | Status: DC | PRN

## 2021-10-15 MED ORDER — ROCURONIUM BROMIDE 10 MG/ML (PF) SYRINGE
PREFILLED_SYRINGE | INTRAVENOUS | Status: AC
  Filled 2021-10-15: qty 10

## 2021-10-15 MED ORDER — DEXMEDETOMIDINE HCL IN NACL 80 MCG/20ML IV SOLN
INTRAVENOUS | Status: AC
  Filled 2021-10-15: qty 20

## 2021-10-15 MED ORDER — FENTANYL CITRATE (PF) 100 MCG/2ML IJ SOLN: 25.0000 ug | INTRAMUSCULAR | Status: DC | PRN

## 2021-10-15 MED ORDER — ROSUVASTATIN CALCIUM 10 MG PO TABS
40.0000 mg | ORAL_TABLET | Freq: Every day | ORAL | Status: DC
  Administered 2021-10-16 – 2021-10-17 (×2): 40 mg via ORAL
  Filled 2021-10-15 (×2): qty 4

## 2021-10-15 MED ORDER — SEVOFLURANE IN SOLN
RESPIRATORY_TRACT | Status: AC
  Filled 2021-10-15: qty 250

## 2021-10-15 MED ORDER — EPHEDRINE SULFATE (PRESSORS) 50 MG/ML IJ SOLN
INTRAMUSCULAR | Status: DC | PRN
  Administered 2021-10-15 (×8): 5 mg via INTRAVENOUS

## 2021-10-15 MED ORDER — LIDOCAINE HCL (PF) 2 % IJ SOLN
INTRAMUSCULAR | Status: AC
  Filled 2021-10-15: qty 5

## 2021-10-15 MED ORDER — GABAPENTIN 400 MG PO CAPS
800.0000 mg | ORAL_CAPSULE | Freq: Three times a day (TID) | ORAL | Status: DC
  Administered 2021-10-15 – 2021-10-17 (×6): 800 mg via ORAL
  Filled 2021-10-15 (×6): qty 2

## 2021-10-15 MED ORDER — EYE WASH OP SOLN: 2.0000 [drp] | OPHTHALMIC | Status: DC | PRN

## 2021-10-15 MED ORDER — FINASTERIDE 5 MG PO TABS
5.0000 mg | ORAL_TABLET | Freq: Every day | ORAL | Status: DC
  Administered 2021-10-16 – 2021-10-17 (×2): 5 mg via ORAL
  Filled 2021-10-15 (×2): qty 1

## 2021-10-15 MED ORDER — ONDANSETRON 4 MG PO TBDP: 4.0000 mg | ORAL_TABLET | Freq: Four times a day (QID) | ORAL | Status: DC | PRN

## 2021-10-15 MED ORDER — OXYCODONE HCL 5 MG PO TABS
5.0000 mg | ORAL_TABLET | Freq: Four times a day (QID) | ORAL | Status: DC | PRN
  Administered 2021-10-15 – 2021-10-16 (×3): 5 mg via ORAL
  Filled 2021-10-15 (×2): qty 1
  Filled 2021-10-15: qty 2
  Filled 2021-10-15: qty 1

## 2021-10-15 MED ORDER — ONDANSETRON HCL 4 MG/2ML IJ SOLN
INTRAMUSCULAR | Status: DC | PRN
  Administered 2021-10-15: 4 mg via INTRAVENOUS

## 2021-10-15 MED ORDER — CITALOPRAM HYDROBROMIDE 20 MG PO TABS
20.0000 mg | ORAL_TABLET | Freq: Every day | ORAL | Status: DC
  Administered 2021-10-16 – 2021-10-17 (×2): 20 mg via ORAL
  Filled 2021-10-15 (×2): qty 1

## 2021-10-15 MED ORDER — MIDAZOLAM HCL 2 MG/2ML IJ SOLN
INTRAMUSCULAR | Status: DC | PRN
  Administered 2021-10-15: 1 mg via INTRAVENOUS

## 2021-10-15 MED ORDER — LIDOCAINE HCL (CARDIAC) PF 100 MG/5ML IV SOSY
PREFILLED_SYRINGE | INTRAVENOUS | Status: DC | PRN
  Administered 2021-10-15: 100 mg via INTRAVENOUS

## 2021-10-15 MED ORDER — SPIRONOLACTONE 25 MG PO TABS
25.0000 mg | ORAL_TABLET | Freq: Every day | ORAL | Status: DC
  Administered 2021-10-16 – 2021-10-17 (×2): 25 mg via ORAL
  Filled 2021-10-15 (×2): qty 1

## 2021-10-15 MED ORDER — MIDAZOLAM HCL 2 MG/2ML IJ SOLN
INTRAMUSCULAR | Status: AC
  Filled 2021-10-15: qty 2

## 2021-10-15 MED ORDER — ROCURONIUM BROMIDE 10 MG/ML (PF) SYRINGE
PREFILLED_SYRINGE | INTRAVENOUS | Status: AC
Start: 2021-10-15 — End: ?
  Filled 2021-10-15: qty 10

## 2021-10-15 MED ORDER — ONDANSETRON HCL 4 MG/2ML IJ SOLN: 4.0000 mg | Freq: Four times a day (QID) | INTRAMUSCULAR | Status: DC | PRN

## 2021-10-15 MED ORDER — FENTANYL CITRATE (PF) 100 MCG/2ML IJ SOLN
INTRAMUSCULAR | Status: DC | PRN
  Administered 2021-10-15: 25 ug via INTRAVENOUS
  Administered 2021-10-15: 50 ug via INTRAVENOUS
  Administered 2021-10-15: 25 ug via INTRAVENOUS
  Administered 2021-10-15: 50 ug via INTRAVENOUS
  Administered 2021-10-15: 100 ug via INTRAVENOUS
  Administered 2021-10-15 (×2): 50 ug via INTRAVENOUS

## 2021-10-15 MED ORDER — PANTOPRAZOLE SODIUM 40 MG PO TBEC
40.0000 mg | DELAYED_RELEASE_TABLET | Freq: Two times a day (BID) | ORAL | Status: DC
  Administered 2021-10-15 – 2021-10-17 (×4): 40 mg via ORAL
  Filled 2021-10-15 (×4): qty 1

## 2021-10-15 MED ORDER — PROPOFOL 10 MG/ML IV BOLUS
INTRAVENOUS | Status: AC
  Filled 2021-10-15: qty 20

## 2021-10-15 MED ORDER — ROCURONIUM BROMIDE 100 MG/10ML IV SOLN
INTRAVENOUS | Status: DC | PRN
  Administered 2021-10-15: 80 mg via INTRAVENOUS
  Administered 2021-10-15: 20 mg via INTRAVENOUS
  Administered 2021-10-15: 40 mg via INTRAVENOUS
  Administered 2021-10-15 (×2): 30 mg via INTRAVENOUS

## 2021-10-15 MED ORDER — CHLORHEXIDINE GLUCONATE 0.12 % MT SOLN
OROMUCOSAL | Status: AC
  Administered 2021-10-15: 15 mL via OROMUCOSAL
  Filled 2021-10-15: qty 15

## 2021-10-15 MED ORDER — ACETAMINOPHEN 10 MG/ML IV SOLN: 1000.0000 mg | Freq: Once | INTRAVENOUS | Status: DC | PRN

## 2021-10-15 MED ORDER — ACETAMINOPHEN 10 MG/ML IV SOLN
INTRAVENOUS | Status: AC
  Filled 2021-10-15: qty 100

## 2021-10-15 MED ORDER — POLYVINYL ALCOHOL 1.4 % OP SOLN
2.0000 [drp] | OPHTHALMIC | Status: DC | PRN
  Administered 2021-10-15 (×2): 2 [drp] via OPHTHALMIC
  Filled 2021-10-15: qty 15

## 2021-10-15 MED ORDER — TORSEMIDE 20 MG PO TABS
40.0000 mg | ORAL_TABLET | Freq: Every day | ORAL | Status: DC
  Administered 2021-10-16 – 2021-10-17 (×2): 40 mg via ORAL
  Filled 2021-10-15 (×2): qty 2

## 2021-10-15 MED ORDER — ACETAMINOPHEN 500 MG PO TABS
1000.0000 mg | ORAL_TABLET | Freq: Four times a day (QID) | ORAL | Status: DC
  Administered 2021-10-15 – 2021-10-17 (×6): 1000 mg via ORAL
  Filled 2021-10-15 (×6): qty 2

## 2021-10-15 MED ORDER — CELECOXIB 200 MG PO CAPS
ORAL_CAPSULE | ORAL | Status: AC
  Administered 2021-10-15: 200 mg via ORAL
  Filled 2021-10-15: qty 1

## 2021-10-15 MED ORDER — DEXAMETHASONE SODIUM PHOSPHATE 10 MG/ML IJ SOLN
INTRAMUSCULAR | Status: DC | PRN
  Administered 2021-10-15: 10 mg via INTRAVENOUS

## 2021-10-15 MED ORDER — BUPIVACAINE LIPOSOME 1.3 % IJ SUSP
INTRAMUSCULAR | Status: AC
  Filled 2021-10-15: qty 20

## 2021-10-15 MED ORDER — SUGAMMADEX SODIUM 200 MG/2ML IV SOLN
INTRAVENOUS | Status: DC | PRN
  Administered 2021-10-15: 200 mg via INTRAVENOUS

## 2021-10-15 MED ORDER — ISOSORBIDE MONONITRATE ER 30 MG PO TB24
60.0000 mg | ORAL_TABLET | Freq: Every day | ORAL | Status: DC
  Administered 2021-10-16 – 2021-10-17 (×2): 60 mg via ORAL
  Filled 2021-10-15 (×2): qty 2

## 2021-10-15 MED ORDER — BUPIVACAINE HCL (PF) 0.5 % IJ SOLN
INTRAMUSCULAR | Status: AC
  Filled 2021-10-15: qty 30

## 2021-10-15 SURGICAL SUPPLY — 110 items
BAG DRAIN CYSTO-URO LG1000N (MISCELLANEOUS) ×3 IMPLANT
BASIN KIT SINGLE STR (MISCELLANEOUS) ×3 IMPLANT
BLADE CLIPPER SURG (BLADE) ×3 IMPLANT
BLADE SURG SZ10 CARB STEEL (BLADE) IMPLANT
BLADE SURG SZ11 CARB STEEL (BLADE) ×3 IMPLANT
BULB RESERV EVAC DRAIN JP 100C (MISCELLANEOUS) ×1 IMPLANT
CANNULA REDUC XI 12-8 STAPL (CANNULA) ×3
CANNULA REDUCER 12-8 DVNC XI (CANNULA) ×2 IMPLANT
CATH FOL LX CONE TIP  8F (CATHETERS) ×3
CATH FOL LX CONE TIP 8F (CATHETERS) ×2 IMPLANT
CATH FOLEY 2WAY  5CC 16FR (CATHETERS) ×3
CATH URETL OPEN 5X70 (CATHETERS) IMPLANT
CATH URTH 16FR FL 2W BLN LF (CATHETERS) ×2 IMPLANT
COVER TIP SHEARS 8 DVNC (MISCELLANEOUS) ×2 IMPLANT
COVER TIP SHEARS 8MM DA VINCI (MISCELLANEOUS) ×3
DERMABOND ADVANCED (GAUZE/BANDAGES/DRESSINGS)
DERMABOND ADVANCED .7 DNX12 (GAUZE/BANDAGES/DRESSINGS) IMPLANT
DRAIN JP 15F RND TROCAR (DRAIN) ×1 IMPLANT
DRAPE ARM DVNC X/XI (DISPOSABLE) ×8 IMPLANT
DRAPE COLUMN DVNC XI (DISPOSABLE) ×2 IMPLANT
DRAPE DA VINCI XI ARM (DISPOSABLE) ×12
DRAPE DA VINCI XI COLUMN (DISPOSABLE) ×3
DRAPE LEGGINS SURG 28X43 STRL (DRAPES) ×3 IMPLANT
DRAPE UNDER BUTTOCK W/FLU (DRAPES) ×4 IMPLANT
DRSG OPSITE POSTOP 4X10 (GAUZE/BANDAGES/DRESSINGS) IMPLANT
DRSG OPSITE POSTOP 4X8 (GAUZE/BANDAGES/DRESSINGS) IMPLANT
ELECT CAUTERY BLADE 6.4 (BLADE) IMPLANT
ELECT REM PT RETURN 9FT ADLT (ELECTROSURGICAL) ×3
ELECTRODE REM PT RTRN 9FT ADLT (ELECTROSURGICAL) ×2 IMPLANT
GAUZE 4X4 16PLY ~~LOC~~+RFID DBL (SPONGE) ×6 IMPLANT
GAUZE SPONGE 4X4 16PLY XRAY LF (GAUZE/BANDAGES/DRESSINGS) ×1 IMPLANT
GLOVE BIOGEL PI IND STRL 7.0 (GLOVE) ×6 IMPLANT
GLOVE BIOGEL PI INDICATOR 7.0 (GLOVE) ×6
GLOVE SURG SYN 6.5 ES PF (GLOVE) ×12 IMPLANT
GLOVE SURG SYN 6.5 PF PI (GLOVE) ×6 IMPLANT
GLOVE SURG UNDER POLY LF SZ7.5 (GLOVE) ×3 IMPLANT
GOWN STRL REUS W/ TWL LRG LVL3 (GOWN DISPOSABLE) ×14 IMPLANT
GOWN STRL REUS W/TWL LRG LVL3 (GOWN DISPOSABLE) ×27
GRASPER SUT TROCAR 14GX15 (MISCELLANEOUS) IMPLANT
GUIDEWIRE STR ZIPWIRE 035X150 (MISCELLANEOUS) ×1 IMPLANT
HANDLE YANKAUER SUCT BULB TIP (MISCELLANEOUS) ×2 IMPLANT
IRRIGATION STRYKERFLOW (MISCELLANEOUS) IMPLANT
IRRIGATOR STRYKERFLOW (MISCELLANEOUS)
IRRIGATOR SUCT 8 DISP DVNC XI (IRRIGATION / IRRIGATOR) IMPLANT
IRRIGATOR SUCTION 8MM XI DISP (IRRIGATION / IRRIGATOR) ×3
IV NS 1000ML (IV SOLUTION) ×3
IV NS 1000ML BAXH (IV SOLUTION) IMPLANT
IV NS IRRIG 3000ML ARTHROMATIC (IV SOLUTION) ×3 IMPLANT
KIT IMAGING PINPOINTPAQ (MISCELLANEOUS) ×4 IMPLANT
KIT PINK PAD W/HEAD ARE REST (MISCELLANEOUS) ×3
KIT PINK PAD W/HEAD ARM REST (MISCELLANEOUS) ×2 IMPLANT
LABEL OR SOLS (LABEL) IMPLANT
MANIFOLD NEPTUNE II (INSTRUMENTS) ×6 IMPLANT
NEEDLE HYPO 22GX1.5 SAFETY (NEEDLE) ×3 IMPLANT
OBTURATOR OPTICAL STANDARD 8MM (TROCAR) ×3
OBTURATOR OPTICAL STND 8 DVNC (TROCAR) ×2
OBTURATOR OPTICALSTD 8 DVNC (TROCAR) ×2 IMPLANT
PACK COLON CLEAN CLOSURE (MISCELLANEOUS) ×3 IMPLANT
PACK CYSTO AR (MISCELLANEOUS) ×3 IMPLANT
PACK LAP CHOLECYSTECTOMY (MISCELLANEOUS) ×3 IMPLANT
PENCIL ELECTRO HAND CTR (MISCELLANEOUS) ×3 IMPLANT
RELOAD STAPLE 60 3.5 BLU DVNC (STAPLE) IMPLANT
RELOAD STAPLER 3.5X60 BLU DVNC (STAPLE) ×4 IMPLANT
RETRACTOR WOUND ALXS 18CM MED (MISCELLANEOUS) IMPLANT
RTRCTR WOUND ALEXIS O 18CM MED (MISCELLANEOUS) ×3
SEAL CANN UNIV 5-8 DVNC XI (MISCELLANEOUS) ×6 IMPLANT
SEAL XI 5MM-8MM UNIVERSAL (MISCELLANEOUS) ×9
SEALER VESSEL DA VINCI XI (MISCELLANEOUS) ×3
SEALER VESSEL EXT DVNC XI (MISCELLANEOUS) IMPLANT
SET CYSTO W/LG BORE CLAMP LF (SET/KITS/TRAYS/PACK) ×3 IMPLANT
SET TRI-LUMEN FLTR TB AIRSEAL (TUBING) ×3 IMPLANT
SOL PREP PVP 2OZ (MISCELLANEOUS) ×3
SOLUTION ELECTROLUBE (MISCELLANEOUS) ×3 IMPLANT
SOLUTION PREP PVP 2OZ (MISCELLANEOUS) ×2 IMPLANT
SPONGE T-LAP 18X18 ~~LOC~~+RFID (SPONGE) ×4 IMPLANT
STAPLER 60 DA VINCI SURE FORM (STAPLE) ×3
STAPLER 60 SUREFORM DVNC (STAPLE) IMPLANT
STAPLER CANNULA SEAL DVNC XI (STAPLE) ×2 IMPLANT
STAPLER CANNULA SEAL XI (STAPLE) ×3
STAPLER CIRCULAR MANUAL XL 25 (STAPLE) IMPLANT
STAPLER CIRCULAR MANUAL XL 29 (STAPLE) ×1 IMPLANT
STAPLER CIRCULAR MANUAL XL 33 (STAPLE) IMPLANT
STAPLER RELOAD 3.5X60 BLU DVNC (STAPLE) ×4
STAPLER RELOAD 3.5X60 BLUE (STAPLE) ×6
STAPLER RELOADABLE 65 2-0 SUT (MISCELLANEOUS) IMPLANT
STAPLER SKIN PROX 35W (STAPLE) ×1 IMPLANT
STAPLER SYS INTERNAL RELOAD SS (MISCELLANEOUS) ×3 IMPLANT
SURGILUBE 2OZ TUBE FLIPTOP (MISCELLANEOUS) ×6 IMPLANT
SUT DVC VLOC 90 3-0 CV23 VLT (SUTURE)
SUT ETHILON 3-0 FS-10 30 BLK (SUTURE) ×3
SUT MNCRL AB 4-0 PS2 18 (SUTURE) ×6 IMPLANT
SUT PDS AB 1 CT1 36 (SUTURE) ×6 IMPLANT
SUT SILK 3 0 SH 30 (SUTURE) ×1 IMPLANT
SUT VIC AB 3-0 SH 27 (SUTURE) ×3
SUT VIC AB 3-0 SH 27X BRD (SUTURE) ×2 IMPLANT
SUT VICRYL 0 AB UR-6 (SUTURE) ×1 IMPLANT
SUTURE DVC VLC 90 3-0 CV23 VLT (SUTURE) IMPLANT
SUTURE EHLN 3-0 FS-10 30 BLK (SUTURE) IMPLANT
SYR 10ML LL (SYRINGE) ×6 IMPLANT
SYR 30ML LL (SYRINGE) ×6 IMPLANT
SYS LAPSCP GELPORT 120MM (MISCELLANEOUS)
SYS TROCAR 1.5-3 SLV ABD GEL (ENDOMECHANICALS) ×3
SYSTEM LAPSCP GELPORT 120MM (MISCELLANEOUS) ×2 IMPLANT
SYSTEM TROCR 1.5-3 SLV ABD GEL (ENDOMECHANICALS) ×2 IMPLANT
SYSTEM WECK SHIELD CLOSURE (TROCAR) IMPLANT
TRAY FOLEY MTR SLVR 16FR STAT (SET/KITS/TRAYS/PACK) ×3 IMPLANT
TROCAR PORT AIRSEAL 5X120 (TROCAR) ×1 IMPLANT
TROCAR XCEL NON-BLD 5MMX100MML (ENDOMECHANICALS) ×1 IMPLANT
WATER STERILE IRR 1000ML POUR (IV SOLUTION) ×3 IMPLANT
WATER STERILE IRR 500ML POUR (IV SOLUTION) ×6 IMPLANT

## 2021-10-15 NOTE — Telephone Encounter (Signed)
Wife was calling to let dr know he is room 203. Please advise

## 2021-10-15 NOTE — Op Note (Signed)
Preoperative diagnosis:  Chronic diverticulitis  Postoperative diagnosis:  Same  Procedure: Cystoscopy with bilateral ureteral catheterization Instillation ureteral indocyanine green-bilateral  Surgeon: Abbie Sons, MD  Anesthesia: General  Complications: None  Intraoperative findings:  Cystoscopy-urethra normal in caliber without stricture.  Prostate with mild lateral lobe enlargement.  Bladder mucosa without erythema, solid or papillary lesions.  Right UO normall in position.  Left UO located just underneath an area of trabeculation and initially difficult to visualize  EBL: None  Specimens: None  Indication: Willie Clark is a 71 y.o. patient with chronic diverticulitis scheduled for robotic assisted laparoscopic sigmoidectomy by Dr. Lysle Pearl is a what is you do.  Instillation of intraureteral ICG has been requested to aid in intraoperative ureteral localization.  Informed consent has been obtained.  Description of procedure:  The patient was taken to the operating room and general anesthesia was induced.  The patient was placed in the dorsal lithotomy position, prepped and draped in the usual sterile fashion, and preoperative antibiotics were administered. A preoperative time-out was performed.   A 21 French scope was lubricated, passed per urethra and advanced proximally under direct vision with findings as described above.  A 5 French open-ended catheter was placed through the working channel of the cystoscope into the right ureteral orifice for a distance of ~ 3-4 cm. 10 mL of ICG was instilled without problems.  The left ureteral orifice was difficult to visualize as noted above.  A 0.038 zip-wire was placed through the ureteral catheter and the UO was identified as noted above.  The catheter was advanced over the guidewire followed by guidewire removal.  10 mL of ICG was then instilled in a similar fashion.  A 16 French Foley catheter was placed and the balloon was inflated  with 10 cc of sterile water.  The catheter was placed to gravity drainage.  The patient was reprepped and draped for for his laparoscopic procedure which will be dictated separately by Dr. Lysle Pearl.   Abbie Sons, M.D.

## 2021-10-16 LAB — CBC
HCT: 41.7 % (ref 39.0–52.0)
Hemoglobin: 13.7 g/dL (ref 13.0–17.0)
MCH: 32.2 pg (ref 26.0–34.0)
MCHC: 32.9 g/dL (ref 30.0–36.0)
MCV: 98.1 fL (ref 80.0–100.0)
Platelets: 181 10*3/uL (ref 150–400)
RBC: 4.25 MIL/uL (ref 4.22–5.81)
RDW: 13.4 % (ref 11.5–15.5)
WBC: 13 10*3/uL — ABNORMAL HIGH (ref 4.0–10.5)
nRBC: 0 % (ref 0.0–0.2)

## 2021-10-16 LAB — BASIC METABOLIC PANEL
Anion gap: 9 (ref 5–15)
BUN: 11 mg/dL (ref 8–23)
CO2: 26 mmol/L (ref 22–32)
Calcium: 9 mg/dL (ref 8.9–10.3)
Chloride: 103 mmol/L (ref 98–111)
Creatinine, Ser: 0.94 mg/dL (ref 0.61–1.24)
GFR, Estimated: >60 mL/min (ref >60)
Glucose, Bld: 145 mg/dL — ABNORMAL HIGH (ref 70–99)
Potassium: 4.1 mmol/L (ref 3.5–5.1)
Sodium: 138 mmol/L (ref 135–145)

## 2021-10-16 NOTE — Anesthesia Postprocedure Evaluation (Signed)
Anesthesia Post Note  Patient: Willie Clark  Procedure(s) Performed: XI ROBOT ASSISTED LAPAROSCOPIC Lt  Hemi- COLECTOMY (Abdomen) INDOCYANINE GREEN FLUORESCENCE IMAGING (ICG)  Patient location during evaluation: PACU Anesthesia Type: General Level of consciousness: awake and alert Pain management: pain level controlled Vital Signs Assessment: post-procedure vital signs reviewed and stable Respiratory status: spontaneous breathing, nonlabored ventilation and respiratory function stable Cardiovascular status: blood pressure returned to baseline and stable Postop Assessment: no apparent nausea or vomiting Anesthetic complications: no Comments: Pt had complained about an irritated, red, painful right eye after surgery. He was given eye lubrication. Today, he states his eye feels normal. There is slight medial right eye conjunctival injection.    No notable events documented.   Last Vitals:  Vitals:   10/16/21 0343 10/16/21 0835  BP: 140/79 129/69  Pulse: 64 64  Resp: 16 16  Temp: 36.5 C 36.5 C  SpO2: 100% 97%    Last Pain:  Vitals:   10/16/21 0906  TempSrc:   PainSc: 4                  Foye Deer

## 2021-10-17 LAB — BASIC METABOLIC PANEL
Anion gap: 9 (ref 5–15)
BUN: 19 mg/dL (ref 8–23)
CO2: 28 mmol/L (ref 22–32)
Calcium: 8.9 mg/dL (ref 8.9–10.3)
Chloride: 101 mmol/L (ref 98–111)
Creatinine, Ser: 1.06 mg/dL (ref 0.61–1.24)
GFR, Estimated: >60 mL/min (ref >60)
Glucose, Bld: 112 mg/dL — ABNORMAL HIGH (ref 70–99)
Potassium: 3.6 mmol/L (ref 3.5–5.1)
Sodium: 138 mmol/L (ref 135–145)

## 2021-10-17 LAB — CBC
HCT: 40.4 % (ref 39.0–52.0)
Hemoglobin: 13.2 g/dL (ref 13.0–17.0)
MCH: 32 pg (ref 26.0–34.0)
MCHC: 32.7 g/dL (ref 30.0–36.0)
MCV: 98.1 fL (ref 80.0–100.0)
Platelets: 159 10*3/uL (ref 150–400)
RBC: 4.12 MIL/uL — ABNORMAL LOW (ref 4.22–5.81)
RDW: 14 % (ref 11.5–15.5)
WBC: 11 10*3/uL — ABNORMAL HIGH (ref 4.0–10.5)
nRBC: 0 % (ref 0.0–0.2)

## 2021-10-17 LAB — SURGICAL PATHOLOGY

## 2021-10-17 MED ORDER — TRAMADOL HCL 50 MG PO TABS: 50.0000 mg | ORAL_TABLET | Freq: Four times a day (QID) | ORAL | 0 refills | Status: AC | PRN

## 2021-10-17 NOTE — Discharge Summary (Signed)
Physician Discharge Summary  Patient ID: Willie Clark MRN: [id number] DOB/AGE: [birth year] 71 y.o.  Admit date: 10/15/2021 Discharge date: 10/17/2021  Admission Diagnoses: Diverticular colitis  Discharge Diagnoses:  Same as above  Discharged Condition: good  Hospital Course: admitted for above. Underwent surgery.  Please see op note for details.  Post op, recovered as expected.  At time of d/c, tolerating diet and pain controlled  Consults: None  Discharge Exam: Blood pressure 133/75, pulse 61, temperature 98.1 F (36.7 C), resp. rate 16, height '5\' 9"'$  (1.753 m), weight 89.8 kg, SpO2 95 %. General appearance: alert, cooperative, and no distress GI: Minor tenderness to palpation along the infraumbilical midline incision as expected.  JP with serosanguineous drainage.  Staples clean dry and intact at all skin incision sites  Disposition:  Discharge disposition: 01-Home or Self Care        Allergies as of 10/17/2021       Reactions   Amitriptyline Other (See Comments)   Pt unsure of what happens Other reaction(s): Other (See Comments) Pt unsure of what happens Pt unsure of what happens   Cyclobenzaprine Other (See Comments), Hives   Pt unsure what hapens Pt unsure what hapens Pt unsure what hapens   Dicyclomine Shortness Of Breath, Swelling   Sacubitril-valsartan Anaphylaxis, Itching   Amitriptyline Hcl    Berniece Salines Flavor Diarrhea   Hydrocodone    Fish Oil Itching   Lidocaine Nausea And Vomiting, Other (See Comments), Nausea Only   Nasal Spray (only time had reaction) Other reaction(s): Dizziness Nasal Spray (only time had reaction)   Omega-3 Fatty Acids-vitamin E Itching   Other Diarrhea, Itching   Reactive agents: Onions, bacon, steak   Oxycodone-acetaminophen Other (See Comments)   Lowers BP Other reaction(s): Other (See Comments) Lowers BP Lowers BP   Sulfa Antibiotics Itching        Medication List     STOP taking these medications    carvedilol  3.125 MG tablet Commonly known as: COREG   dicyclomine 10 MG capsule Commonly known as: BENTYL   docusate calcium 240 MG capsule Commonly known as: SURFAK   losartan 25 MG tablet Commonly known as: COZAAR   oxyCODONE 5 MG immediate release tablet Commonly known as: Oxy IR/ROXICODONE   polycarbophil 625 MG tablet Commonly known as: FIBERCON   Vascepa 1 g capsule Generic drug: icosapent Ethyl       TAKE these medications    acetaminophen 500 MG tablet Commonly known as: TYLENOL Take 1,000 mg by mouth every 6 (six) hours as needed for mild pain.   aspirin EC 81 MG tablet Take 1 tablet (81 mg total) by mouth daily. Swallow whole.   citalopram 20 MG tablet Commonly known as: CELEXA Take 20 mg by mouth daily.   clopidogrel 75 MG tablet Commonly known as: PLAVIX TAKE (1) TABLET 75 MG DAILY.   empagliflozin 10 MG Tabs tablet Commonly known as: Jardiance Take 1 tablet (10 mg total) by mouth daily before breakfast.   EPINEPHrine 0.3 mg/0.3 mL Soaj injection Commonly known as: EPI-PEN Inject 0.3 mg into the muscle as needed.   fexofenadine 180 MG tablet Commonly known as: ALLEGRA Take 180 mg by mouth daily.   finasteride 5 MG tablet Commonly known as: PROSCAR Take 5 mg by mouth daily.   gabapentin 800 MG tablet Commonly known as: NEURONTIN Take 1 tablet by mouth 3 (three) times daily.   HAIR SKIN AND NAILS FORMULA PO Take 1 capsule by mouth daily.   IBGARD PO  Take by mouth daily at 2 am.   isosorbide mononitrate 60 MG 24 hr tablet Commonly known as: IMDUR TAKE 1 TABLET EVERY DAY   nitroGLYCERIN 0.4 MG SL tablet Commonly known as: NITROSTAT Place 1 tablet (0.4 mg total) under the tongue every 5 (five) minutes as needed for chest pain.   nystatin-triamcinolone ointment Commonly known as: MYCOLOG Apply 1 application topically 2 (two) times daily.   ondansetron 4 MG tablet Commonly known as: ZOFRAN Take 4 mg by mouth every 8 (eight) hours as needed  for nausea or vomiting.   pantoprazole 40 MG tablet Commonly known as: PROTONIX Take 40 mg by mouth 2 (two) times daily.   potassium chloride SA 20 MEQ tablet Commonly known as: Klor-Con M20 Take 1 tablet (20 mEq total) by mouth daily.   PROBIOTIC PO Take by mouth daily at 8 pm.   rOPINIRole 1 MG tablet Commonly known as: REQUIP Take 1 mg by mouth 2 (two) times daily.   rosuvastatin 40 MG tablet Commonly known as: CRESTOR TAKE 1 TABLET EVERY DAY ( DOSE INCREASE )   spironolactone 25 MG tablet Commonly known as: ALDACTONE TAKE 1 TABLET EVERY DAY   torsemide 20 MG tablet Commonly known as: DEMADEX Take 2 tablets (40 mg total) by mouth daily.   traMADol 50 MG tablet Commonly known as: ULTRAM Take 1 tablet (50 mg total) by mouth every 6 (six) hours as needed for up to 15 doses for severe pain or moderate pain. What changed: reasons to take this   vitamin B-12 1000 MCG tablet Commonly known as: CYANOCOBALAMIN Take 1,000 mcg by mouth daily.        Follow-up Information     Addalyn Speedy, DO Follow up in 1 week(s).   Specialty: Surgery Why: post op sigmoidectomy, possible drain removal Contact information: [address] [place] [address] [phone]                  Total time spent arranging discharge was >60mn. Signed: IBenjamine Sprague6/28/2023, 1:44 PM

## 2021-10-17 NOTE — Discharge Instructions (Signed)
Laparoscopic Colectomy, Care After This sheet gives you information about how to care for yourself after your procedure. Your health care provider may also give you more specific instructions. If you have problems or questions, contact your health care provider. What can I expect after the procedure? After your procedure, it is common to have the following: Pain in your abdomen, especially in the incision areas. You will be given medicine to control the pain. Tiredness. This is a normal part of the recovery process. Your energy level will return to normal over the next several weeks. Changes in your bowel movements, such as constipation or needing to go more often. Talk with your health care provider about how to manage this. Follow these instructions at home: Medicines  tylenol and advil as needed for discomfort.  Please alternate between the two every four hours as needed for pain.    Use narcotics, if prescribed, only when tylenol and motrin is not enough to control pain.  325-650mg every 8hrs to max of 4000mg/24hrs (including the 325mg in every norco dose) for the tylenol.    Advil up to 800mg per dose every 8hrs as needed for pain.   Do not drive or use heavy machinery while taking prescription pain medicine. Do not drink alcohol while taking prescription pain medicine. If you were prescribed an antibiotic medicine, use it as told by your health care provider. Do not stop using the antibiotic even if you start to feel better. Incision care    Follow instructions from your health care provider about how to take care of your incision areas. Make sure you: Keep your incisions clean and dry. Wash your hands with soap and water before and after applying medicine to the areas, and before and after changing your bandage (dressing). If soap and water are not available, use hand sanitizer. Change your dressing as told by your health care provider. Leave stitches (sutures), skin glue, or adhesive  strips in place. These skin closures may need to stay in place for 2 weeks or longer. If adhesive strip edges start to loosen and curl up, you may trim the loose edges. Do not remove adhesive strips completely unless your health care provider tells you to do that. Do not wear tight clothing over the incisions. Tight clothing may rub and irritate the incision areas, which may cause the incisions to open. Do not take baths, swim, or use a hot tub until your health care provider approves. OK TO SHOWER.   Check your incision area every day for signs of infection. Check for: More redness, swelling, or pain. More fluid or blood. Warmth. Pus or a bad smell. Activity Avoid lifting anything that is heavier than 10 lb (4.5 kg) for 2 weeks or until your health care provider says it is okay. You may resume normal activities as told by your health care provider. Ask your health care provider what activities are safe for you. Take rest breaks during the day as needed. Eating and drinking Follow instructions from your health care provider about what you can eat after surgery. To prevent or treat constipation while you are taking prescription pain medicine, your health care provider may recommend that you: Drink enough fluid to keep your urine clear or pale yellow. Take over-the-counter or prescription medicines. Eat foods that are high in fiber, such as fresh fruits and vegetables, whole grains, and beans. Limit foods that are high in fat and processed sugars, such as fried and sweet foods. General instructions Ask your   health care provider when you will need an appointment to get your sutures or staples removed. Keep all follow-up visits as told by your health care provider. This is important. Contact a health care provider if: You have more redness, swelling, or pain around your incisions. You have more fluid or blood coming from the incisions. Your incisions feel warm to the touch. You have pus or a  bad smell coming from your incisions or your dressing. You have a fever. You have an incision that breaks open (edges not staying together) after sutures or staples have been removed. Get help right away if: You develop a rash. You have chest pain or difficulty breathing. You have pain or swelling in your legs. You feel light-headed or you faint. Your abdomen swells (becomes distended). You have nausea or vomiting. You have blood in your stool (feces). This information is not intended to replace advice given to you by your health care provider. Make sure you discuss any questions you have with your health care provider. Document Released: 10/26/2004 Document Revised: 12/26/2017 Document Reviewed: 01/08/2016 Elsevier Interactive Patient Education  2019 [employer]    

## 2021-10-24 ENCOUNTER — Other Ambulatory Visit: Payer: Self-pay | Admitting: Cardiovascular Disease

## 2021-10-24 DIAGNOSIS — I5022 Chronic systolic (congestive) heart failure: Secondary | ICD-10-CM

## 2021-10-24 DIAGNOSIS — I10 Essential (primary) hypertension: Secondary | ICD-10-CM

## 2021-10-31 ENCOUNTER — Other Ambulatory Visit: Payer: Self-pay | Admitting: Physician Assistant

## 2021-11-08 ENCOUNTER — Emergency Department
Admission: EM | Admit: 2021-11-08 | Discharge: 2021-11-08 | Disposition: A | Discharge status: Home / Self Care | Payer: Medicare HMO | Attending: Emergency Medicine | Admitting: Emergency Medicine

## 2021-11-08 ENCOUNTER — Other Ambulatory Visit: Payer: Self-pay

## 2021-11-08 ENCOUNTER — Emergency Department: Payer: Medicare HMO

## 2021-11-08 DIAGNOSIS — I10 Essential (primary) hypertension: Secondary | ICD-10-CM | POA: Insufficient documentation

## 2021-11-08 DIAGNOSIS — A0472 Enterocolitis due to Clostridium difficile, not specified as recurrent: Secondary | ICD-10-CM | POA: Diagnosis not present

## 2021-11-08 DIAGNOSIS — I2510 Atherosclerotic heart disease of native coronary artery without angina pectoris: Secondary | ICD-10-CM | POA: Insufficient documentation

## 2021-11-08 DIAGNOSIS — R197 Diarrhea, unspecified: Secondary | ICD-10-CM | POA: Diagnosis present

## 2021-11-08 DIAGNOSIS — D72829 Elevated white blood cell count, unspecified: Secondary | ICD-10-CM | POA: Insufficient documentation

## 2021-11-08 DIAGNOSIS — E876 Hypokalemia: Secondary | ICD-10-CM | POA: Diagnosis not present

## 2021-11-08 DIAGNOSIS — R1084 Generalized abdominal pain: Secondary | ICD-10-CM | POA: Diagnosis not present

## 2021-11-08 DIAGNOSIS — J45909 Unspecified asthma, uncomplicated: Secondary | ICD-10-CM | POA: Diagnosis not present

## 2021-11-08 LAB — COMPREHENSIVE METABOLIC PANEL
ALT: 29 U/L (ref 0–44)
AST: 30 U/L (ref 15–41)
Albumin: 4 g/dL (ref 3.5–5.0)
Alkaline Phosphatase: 79 U/L (ref 38–126)
Anion gap: 11 (ref 5–15)
BUN: 10 mg/dL (ref 8–23)
CO2: 28 mmol/L (ref 22–32)
Calcium: 9 mg/dL (ref 8.9–10.3)
Chloride: 100 mmol/L (ref 98–111)
Creatinine, Ser: 1.1 mg/dL (ref 0.61–1.24)
GFR, Estimated: >60 mL/min (ref >60)
Glucose, Bld: 110 mg/dL — ABNORMAL HIGH (ref 70–99)
Potassium: 3.2 mmol/L — ABNORMAL LOW (ref 3.5–5.1)
Sodium: 139 mmol/L (ref 135–145)
Total Bilirubin: 0.3 mg/dL (ref 0.3–1.2)
Total Protein: 7.2 g/dL (ref 6.5–8.1)

## 2021-11-08 LAB — CBC WITH DIFFERENTIAL/PLATELET
Abs Immature Granulocytes: 0.04 10*3/uL (ref 0.00–0.07)
Basophils Absolute: 0.1 10*3/uL (ref 0.0–0.1)
Basophils Relative: 0 %
Eosinophils Absolute: 0.2 10*3/uL (ref 0.0–0.5)
Eosinophils Relative: 1 %
HCT: 44.6 % (ref 39.0–52.0)
Hemoglobin: 14.8 g/dL (ref 13.0–17.0)
Immature Granulocytes: 0 %
Lymphocytes Relative: 21 %
Lymphs Abs: 2.5 10*3/uL (ref 0.7–4.0)
MCH: 31.3 pg (ref 26.0–34.0)
MCHC: 33.2 g/dL (ref 30.0–36.0)
MCV: 94.3 fL (ref 80.0–100.0)
Monocytes Absolute: 0.8 10*3/uL (ref 0.1–1.0)
Monocytes Relative: 6 %
Neutro Abs: 8.7 10*3/uL — ABNORMAL HIGH (ref 1.7–7.7)
Neutrophils Relative %: 72 %
Platelets: 257 10*3/uL (ref 150–400)
RBC: 4.73 MIL/uL (ref 4.22–5.81)
RDW: 12.8 % (ref 11.5–15.5)
WBC: 12.2 10*3/uL — ABNORMAL HIGH (ref 4.0–10.5)
nRBC: 0 % (ref 0.0–0.2)

## 2021-11-08 LAB — LIPASE, BLOOD: Lipase: 51 U/L (ref 11–51)

## 2021-11-08 MED ORDER — VANCOMYCIN HCL 125 MG PO CAPS
125.0000 mg | ORAL_CAPSULE | ORAL | Status: AC
  Administered 2021-11-08: 125 mg via ORAL
  Filled 2021-11-08: qty 1

## 2021-11-08 MED ORDER — LACTATED RINGERS IV BOLUS
1000.0000 mL | Freq: Once | INTRAVENOUS | Status: AC
  Administered 2021-11-08: 1000 mL via INTRAVENOUS

## 2021-11-08 MED ORDER — POTASSIUM CHLORIDE CRYS ER 20 MEQ PO TBCR
40.0000 meq | EXTENDED_RELEASE_TABLET | Freq: Once | ORAL | Status: AC
Start: 2021-11-08 — End: 2021-11-08
  Administered 2021-11-08: 40 meq via ORAL
  Filled 2021-11-08: qty 2

## 2021-11-08 MED ORDER — IOHEXOL 300 MG/ML  SOLN
100.0000 mL | Freq: Once | INTRAMUSCULAR | Status: AC | PRN
Start: 2021-11-08 — End: 2021-11-08
  Administered 2021-11-08: 100 mL via INTRAVENOUS

## 2021-11-08 NOTE — ED Provider Notes (Signed)
[facility] Provider Note    Event Date/Time   First MD Initiated Contact with Patient 11/08/21 1936     (approximate)   History   Diarrhea   HPI  Willie Clark is a 71 y.o. male with a past medical history of asthma, CAD, HTN, HDL, OSA, arthritis, PE, OSA, and diverticulitis status post recent partial colectomy complicated by abdominal wound infection treated with Augmentin having subsequently developed diarrhea and diagnosed with C. difficile toxin on 7/18.  Patient states he has had weeks of diarrhea was gotten worse the last couple days he has had slightly more increased generalized crampy abdominal pain.  No fevers, cough, chest pain, shortness of breath, vomiting, nausea, urinary symptoms but he states he has had some bright red blood when he wipes and he thinks one of his hemorrhoids bleeding.  States he was told that the antibiotic that was called in for his C. difficile is over thousand dollars and he cannot afford this.  He has no other acute concerns at this time.      Physical Exam  Triage Vital Signs: ED Triage Vitals  Enc Vitals Group     BP 11/08/21 1451 106/77     Pulse Rate 11/08/21 1451 87     Resp 11/08/21 1451 18     Temp 11/08/21 1451 98.6 F (37 C)     Temp Source 11/08/21 1451 Oral     SpO2 11/08/21 1451 96 %     Weight 11/08/21 1452 182 lb (82.6 kg)     Height 11/08/21 1452 '5\' 9"'$  (1.753 m)     Head Circumference --      Peak Flow --      Pain Score 11/08/21 1451 2     Pain Loc --      Pain Edu? --      Excl. in [place]? --     Most recent vital signs: Vitals:   11/08/21 1451  BP: 106/77  Pulse: 87  Resp: 18  Temp: 98.6 F (37 C)  SpO2: 96%    General: Awake, no distress.  CV:  Good peripheral perfusion.  2+ radial pulse. Resp:  Normal effort.  Clear bilaterally. Abd:  No distention.  Mildly tender throughout but soft.  Incision sites are clean.  Some scant dried blood in right lower quadrant incision  site Other:     ED Results / Procedures / Treatments  Labs (all labs ordered are listed, but only abnormal results are displayed) Labs Reviewed  CBC WITH DIFFERENTIAL/PLATELET - Abnormal; Notable for the following components:      Result Value   WBC 12.2 (*)    Neutro Abs 8.7 (*)    All other components within normal limits  COMPREHENSIVE METABOLIC PANEL - Abnormal; Notable for the following components:   Potassium 3.2 (*)    Glucose, Bld 110 (*)    All other components within normal limits  LIPASE, BLOOD  URINALYSIS, ROUTINE W REFLEX MICROSCOPIC     EKG   RADIOLOGY  CT abdomen pelvis my interpretation without evidence of abscess, perforation but does show some thickening and pericolonic areas.  I reviewed radiology interpretation and agree to findings the patient is status post left hemicolectomy and there is some mild rectal wall thickening with pericolonic stranding favoring proctocolitis without other acute process.   PROCEDURES:  Critical Care performed: No  .1-3 Lead EKG Interpretation  Performed by: Lucrezia Starch, MD Authorized by: Lucrezia Starch, MD  Interpretation: normal     ECG rate assessment: normal     Rhythm: sinus rhythm     Ectopy: none     Conduction: normal     The patient is on the cardiac monitor to evaluate for evidence of arrhythmia and/or significant heart rate changes.   MEDICATIONS ORDERED IN ED: Medications  lactated ringers bolus 1,000 mL (has no administration in time range)  potassium chloride SA (KLOR-CON M) CR tablet 40 mEq (has no administration in time range)     IMPRESSION / MDM / ASSESSMENT AND PLAN / ED COURSE  I reviewed the triage vital signs and the nursing notes. Patient's presentation is most consistent with acute presentation with potential threat to life or bodily function.                               Differential diagnosis includes, but is not limited to symptoms related to C. difficile colitis  possibly causing some dehydration, electrolyte derangements and recurrent diverticulitis given reports of worsening pain over the last couple days.  CMP today shows a K of 3.2 without any other significant electrolyte or metabolic derangements.  No evidence of hepatitis or cholestatic process.  Lipase is WNL nonsuggestive of pancreatitis.  CBC with WBC count of 12.2, hemoglobin normal and platelets normal.  I reviewed outside labs including C. difficile toxin that was +2 days ago.  CT abdomen pelvis my interpretation without evidence of abscess, perforation but does show some thickening and pericolonic areas.  I reviewed radiology interpretation and agree to findings the patient is status post left hemicolectomy and there is some mild rectal wall thickening with pericolonic stranding favoring proctocolitis without other acute process.  Patient is overall nontoxic-appearing and there is no evidence of perforation or sepsis.  He is able to tolerate p.o.  We will give a dose of oral vancomycin here.  I did speak with the pharmacist at patient's preferred pharmacy states that patient's information he received apparently from a neighbor is incorrect and that this medication is vancomycin 4 times a day for 10 days and is $25 with his insurance.  I think he is appropriate for outpatient management and will pick up his vancomycin tomorrow.  He can follow-up with his PCP.  Discussed returning for any new or worsening of symptoms.  Discharged in stable condition.  Strict return precautions advised and discussed.      FINAL CLINICAL IMPRESSION(S) / ED DIAGNOSES   Final diagnoses:  C. difficile diarrhea  Hypokalemia     Rx / DC Orders   ED Discharge Orders     None        Note:  This document was prepared using Dragon voice recognition software and may include unintentional dictation errors.   Lucrezia Starch, MD 11/08/21 2125

## 2021-11-08 NOTE — ED Triage Notes (Signed)
Pt states that he has had diarrhea x4 weeks and he was seen at [facility] and had a GI panel done- pt states that it came back positive for something and they called in a medicine for him but it was too expensive and then was told to come here

## 2021-11-08 NOTE — ED Provider Triage Note (Signed)
Emergency Medicine Provider Triage Evaluation Note  Willie Clark , a 71 y.o. male  was evaluated in triage.  Patient has a history of PE, fibromyalgia, hypertension, hyperlipidemia and diverticulitis and recently tested positive for C. difficile.  Patient reports that he was unable to afford medication that was called into his pharmacy and his general surgeon, Dr. Lysle Pearl recommended that he come into the emergency department.  Patient is having some perianal bleeding due to constant diarrhea and patient states that he is not able to maintain p.o. intake at home.  Review of Systems  Positive: Patient has C. difficile and is having diarrhea. Negative: No chest pain or abdominal pain.  Physical Exam  BP 106/77 (BP Location: Left Arm)   Pulse 87   Temp 98.6 F (37 C) (Oral)   Resp 18   SpO2 96%  Gen:   Awake, no distress   Resp:  Normal effort  MSK:   Moves extremities without difficulty  Other:    Medical Decision Making  Medically screening exam initiated at 2:52 PM.  Appropriate orders placed.  Willie Clark was informed that the remainder of the evaluation will be completed by another provider, this initial triage assessment does not replace that evaluation, and the importance of remaining in the ED until their evaluation is complete.     Vallarie Mare [place], [place] 11/08/21 1453

## 2021-11-08 NOTE — ED Notes (Signed)
Patient verbalizes understanding of discharge instructions. Opportunity for questioning and answers were provided. Armband removed by staff, pt discharged from ED to home via POV  

## 2021-11-14 ENCOUNTER — Other Ambulatory Visit: Payer: Self-pay | Admitting: Physician Assistant

## 2021-12-10 ENCOUNTER — Other Ambulatory Visit: Payer: Self-pay | Admitting: Cardiovascular Disease

## 2021-12-10 NOTE — Progress Notes (Signed)
Requested primary care notes.

## 2021-12-13 ENCOUNTER — Other Ambulatory Visit: Payer: Self-pay | Admitting: Cardiovascular Disease

## 2021-12-18 ENCOUNTER — Encounter: Payer: Self-pay | Admitting: Cardiovascular Disease

## 2021-12-18 ENCOUNTER — Ambulatory Visit: Payer: Medicare HMO | Attending: Cardiovascular Disease | Admitting: Cardiovascular Disease

## 2021-12-18 VITALS — BP 116/72 | HR 56 | Ht 68.5 in | Wt 195.6 lb

## 2021-12-18 DIAGNOSIS — E785 Hyperlipidemia, unspecified: Secondary | ICD-10-CM | POA: Diagnosis not present

## 2021-12-18 DIAGNOSIS — I779 Disorder of arteries and arterioles, unspecified: Secondary | ICD-10-CM

## 2021-12-18 DIAGNOSIS — I5022 Chronic systolic (congestive) heart failure: Secondary | ICD-10-CM

## 2021-12-18 DIAGNOSIS — I25118 Atherosclerotic heart disease of native coronary artery with other forms of angina pectoris: Secondary | ICD-10-CM

## 2021-12-18 DIAGNOSIS — I714 Abdominal aortic aneurysm, without rupture, unspecified: Secondary | ICD-10-CM

## 2021-12-18 NOTE — Progress Notes (Signed)
Cardiology Office Note   Date:  12/18/2021   ID:  Willie Clark 1950/09/04, MRN [id number]  PCP:  [facility]  Cardiologist:   [name], MD   No chief complaint on file.     History of Present Illness: Willie Clark is a 71 y.o. male who presents for regarding coronary artery disease and chronic systolic heart failure.  He has known history of coronary artery disease. He is status post coronary artery bypass graft surgery in 1993 and redo in 2007.   He suffers from fibromyalgia and chronic pain.  He had GI symptoms in 2015 due to documented gastric ulcer. He had neck surgery in 7616 without complications. He has known history of chronic neck pain with 2 previous cervical spine surgeries.    He was hospitalized in August of 2021with chest pain.  Echocardiogram showed an EF of 40 to 45%.  Cardiac catheterization showed severe underlying three-vessel coronary arteries with patent grafts and no significant change since 2017.  The patient antihypertensive medications were switched to losartan and carvedilol.  Rosuvastatin was increased to 40 mg daily. Losartan was subsequently switched to [facility].  However, the patient had allergic reaction with rash and itching that had to be treated in the emergency room.  Spironolactone was subsequently added.  He was seen on March in the office with symptoms of unstable angina.  His EKG was worrisome for inferior ST elevation myocardial infarction and thus he was transferred emergently to the Cath Lab where he underwent cardiac catheterization which showed severe native three-vessel coronary artery disease with patent left circumflex stent with mild to moderate in-stent restenosis, widely patent LIMA to LAD, patent SVG to OM 2 with 60 to 70% proximal graft stenosis and subtotally occluded SVG to RCA with long segment of proximal graft disease and 99% stenosis at the distal anastomosis.  He underwent successful PCI to SVG  to RCA with overlapping resolute Onyx drug-eluting stents. Echocardiogram during that admission showed an EF of 35 to 40%.    He was hospitalized in July with increased shortness of breath and chest pain.   Cardiac catheterization was performed which showed significant underlying three-vessel coronary artery disease with patent left circumflex stent with moderate in-stent restenosis, patent LIMA to LAD, patent SVG to right PDA with patent stents and patent SVG to OM with sequential 75% and 60% ostial and mid graft stenoses.  Right heart catheterization showed mildly elevated filling pressures with mildly reduced cardiac output.  PCI with 2 nonoverlapping drug-eluting stent placement was done to SVG to OM.  He was switched from Brilinta to Plavix due to persistent shortness of breath.  He underwent hemicolectomy in June for recurrent diverticulitis and abdominal pain.  He recovered well and resume dual antiplatelet therapy.  He did have C. difficile colitis that subsequently improved.  He is now doing reasonably well with improved abdominal pain.  He has chronic chest pain and shortness of breath which has not changed.  He has lower extremity edema that seems to be stable.  Past Medical History:  Diagnosis Date   AAA (abdominal aortic aneurysm) ([place]) 02/23/2020   a.) AAA duplex 02/23/2020: measured 3.0 cm   Anxiety    Aortic atherosclerosis (HCC)    Arthritis    ASD (atrial septal defect) 01/20/2006   a.) noted on intraoperative TEE; s/p closure   Barrett's esophagus    Chronic chest pain    COPD (chronic obstructive pulmonary disease) ([place])  Coronary artery disease    a.)CABG 93; Redo 07; b.)LHC 03/15: 100 mLAD, 60 pLCx, 100 OM1, 80 pRCA, 100 mRCA - med mgmt. c.)LHC 6/17: LAD 100ost,  LCx 60p, OM1 100, 100 oRCA, VG-OM2 min irreg, VG-RPDA 30ost, 20d - med mgmt; d.)LHC 8/21: 100 oLAD, 100 oOM1-OM1, 100 o-pRCA, 100 RPL2, 100 RPL3, 100 OM2; pat grafts - med mgmt; e.)LHC 3/22: 60-70 o-pVG-OM2,  80-99 VG-RCA (DES x3); f.)R/LHC 07/22: 75 (ost) & 60 (mid) VG-OM (DES x2)   Depression    Diverticulitis    Erectile dysfunction    Fibromyalgia    Gastric ulcer    Gastritis    GERD (gastroesophageal reflux disease)    Headache    Hemorrhoids    HFrEF (heart failure with reduced ejection fraction) ([place])    a.) 09/2016 Echo: EF 55-60%, no rwma, triv AI, mild MR, mildly dil LA. -> as of April 2020: EF 40 to 45%.  Septal dyssynergy/hypokinesis due to postop state but otherwise unable to assess wall motion [name] due to poor study. b.) TTE 12/12/2019: EF 40-45%, LAE, mild AR, G1DD; c.) TTE 06/20/2020: EF 35-40%, sev inferolateral HK, mild AR, G1DD.   History of myocardial infarct at age less than 4 years    Hyperlipidemia    Hypertension    Kidney stones    Lightheadedness    Long term current use of antithrombotics/antiplatelets    a.) daily DAPT therapy (ASA + clopidogrel)   Mass on back    Myocardial infarction ([place]) 1993   Nephrolithiasis    Orthostatic hypotension    a. Improved after discontinuation of metoprolol.   OSA on CPAP    Peyronie disease    Postoperative atrial fibrillation/flutter ([place]) 01/20/2006   a.) POD2 following re-do CABG; Tx'd with IV amiodarone, which did not convert; EF 20% on POD3; DCCV (100 J x 1) POD4.   Pulmonary embolism ([place]) 01/2006   Post-op/treated   RLS (restless legs syndrome)    a.) on ropinirole   S/P CABG x 2 01/24/1992   a.) 2v CABG: LIMA-LAD, SVG-D2   S/P cervical spinal fusion    S/P RE-DO CABG x 2 01/20/2006   a.) 2v REDO CABG: reserse SVG-PDA, SVG-OM1   Skin lesions, generalized    Small bowel obstruction ([place]) 02/2009   ST elevation myocardial infarction (STEMI) of inferior wall ([place]) 06/2020   a.) LHC 06/2020 --> subtotally occluded SVG-RCA with long segment of proximal graft disease and 99% stenosis at the distal anastomosis --> successful PCI to SVG to RCA with overlapping Resolute Onyx DES x 3   Stroke [facility]) 1993   right side  weakness no blood thinners   on Aspirin   Syncope and collapse    TIA (transient ischemic attack) 08/13/2014   Urticaria     Past Surgical History:  Procedure Laterality Date   BACK SURGERY     bowel obstruction  01/2009   CARDIAC CATHETERIZATION  11/2006   patent grafts. Significant OM3 disease and occluded diagonals.   CARDIAC CATHETERIZATION  06/2010   patent grafts. significant ISR in proximal LCX but OM2 is bypassed and gives retrograde flow to OM3.    CARDIAC CATHETERIZATION  06/2013   [facility]: Patent grafts with 60% proximal in-stent restenosis in the left circumflex with FFR of 0.85   CARDIAC CATHETERIZATION Left 09/25/2015   Procedure: Left Heart Cath and Cors/Grafts Angiography;  Surgeon: Wellington Hampshire, MD;  Location: [place] CV LAB;  Service: Cardiovascular;  Laterality: Left;   CERVICAL FUSION  CHOLECYSTECTOMY     COLON SURGERY  01/2009   BOWEL RESECTION DUE TO SMALL BOWEL OBSTRUCTION   COLONOSCOPY     COLONOSCOPY WITH PROPOFOL N/A 10/28/2017   Procedure: COLONOSCOPY WITH PROPOFOL;  Surgeon: Lollie Sails, MD;  Location: [facility] ENDOSCOPY;  Service: Endoscopy;  Laterality: N/A;   COLONOSCOPY WITH PROPOFOL N/A 07/30/2021   Procedure: COLONOSCOPY WITH PROPOFOL;  Surgeon: Lesly Rubenstein, MD;  Location: [facility] ENDOSCOPY;  Service: Endoscopy;  Laterality: N/A;   CORONARY ANGIOPLASTY  2006   CORONARY ARTERY BYPASS GRAFT  1993/01/2006   redo at [facility]. LIMA to LAD, SVG to OM2 and SVG to RPDA   CORONARY STENT INTERVENTION N/A 06/20/2020   Procedure: CORONARY STENT INTERVENTION;  Surgeon: Nelva Bush, MD;  Location: [place] CV LAB;  Service: Cardiovascular;  Laterality: N/A;   CORONARY STENT INTERVENTION N/A 11/15/2020   Procedure: CORONARY STENT INTERVENTION;  Surgeon: Nelva Bush, MD;  Location: [place] CV LAB;  Service: Cardiovascular;  Laterality: N/A;   CYSTOSCOPY WITH STENT PLACEMENT Bilateral 09/24/2015   Procedure: CYSTOSCOPY WITH  BILATERAL RETROGRADES, BILATERAL STENT PLACEMENT;  Surgeon: Festus Aloe, MD;  Location: [facility] ORS;  Service: Urology;  Laterality: Bilateral;   ESOPHAGOGASTRODUODENOSCOPY N/A 10/25/2014   Procedure: ESOPHAGOGASTRODUODENOSCOPY (EGD);  Surgeon: Lollie Sails, MD;  Location: [facility] ENDOSCOPY;  Service: Endoscopy;  Laterality: N/A;   ESOPHAGOGASTRODUODENOSCOPY (EGD) WITH PROPOFOL N/A 02/03/2017   Procedure: ESOPHAGOGASTRODUODENOSCOPY (EGD) WITH PROPOFOL;  Surgeon: Lollie Sails, MD;  Location: [facility] ENDOSCOPY;  Service: Endoscopy;  Laterality: N/A;   ESOPHAGOGASTRODUODENOSCOPY (EGD) WITH PROPOFOL N/A 04/18/2017   Procedure: ESOPHAGOGASTRODUODENOSCOPY (EGD) WITH PROPOFOL;  Surgeon: Lollie Sails, MD;  Location: [facility] ENDOSCOPY;  Service: Endoscopy;  Laterality: N/A;   EYE SURGERY Bilateral    Cataract Extraction with IOL   HERNIA REPAIR     INGUINAL HERNIA REPAIR Bilateral 01/23/2016   Procedure: HERNIA REPAIR INGUINAL ADULT BILATERAL;  Surgeon: Leonie Green, MD;  Location: [facility] ORS;  Service: General;  Laterality: Bilateral;   LEFT HEART CATH AND CORS/GRAFTS ANGIOGRAPHY N/A 12/13/2019   Procedure: LEFT HEART CATH AND CORS/GRAFTS ANGIOGRAPHY;  Surgeon: Wellington Hampshire, MD;  Location: [place] [facility] LAB;  Service: Cardiovascular;  Laterality: N/A;   LEFT HEART CATH AND CORS/GRAFTS ANGIOGRAPHY N/A 06/20/2020   Procedure: LEFT HEART CATH AND CORS/GRAFTS ANGIOGRAPHY;  Surgeon: Nelva Bush, MD;  Location: [place] CV LAB;  Service: Cardiovascular;  Laterality: N/A;   LUMBAR LAMINECTOMY/DECOMPRESSION MICRODISCECTOMY Left 10/01/2016   Procedure: Left Lumbar four-five Laminotomy for resection of synovial cyst;  Surgeon: Erline Levine, MD;  Location: [place];  Service: Neurosurgery;  Laterality: Left;  left   NECK SURGERY  06/2009   RIGHT/LEFT HEART CATH AND CORONARY/GRAFT ANGIOGRAPHY N/A 11/15/2020   Procedure: RIGHT/LEFT HEART CATH AND CORONARY/GRAFT ANGIOGRAPHY;  Surgeon:  Nelva Bush, MD;  Location: [place] CV LAB;  Service: Cardiovascular;  Laterality: N/A;   SHOULDER SURGERY Bilateral 2010     Current Outpatient Medications  Medication Sig Dispense Refill   acetaminophen (TYLENOL) 500 MG tablet Take 1,000 mg by mouth every 6 (six) hours as needed for mild pain.     albuterol (VENTOLIN HFA) 108 (90 Base) MCG/ACT inhaler Inhale into the lungs.     aspirin EC 81 MG EC tablet Take 1 tablet (81 mg total) by mouth daily. Swallow whole. 30 tablet 11   citalopram (CELEXA) 20 MG tablet Take 20 mg by mouth daily.      clopidogrel (PLAVIX) 75 MG tablet TAKE (1) TABLET 75 MG  DAILY. 90 tablet 3   empagliflozin (JARDIANCE) 10 MG TABS tablet Take 1 tablet (10 mg total) by mouth daily before breakfast. 90 tablet 3   EPINEPHrine 0.3 mg/0.3 mL IJ SOAJ injection Inject 0.3 mg into the muscle as needed.      fexofenadine (ALLEGRA) 180 MG tablet Take 180 mg by mouth daily.     finasteride (PROSCAR) 5 MG tablet Take 5 mg by mouth daily.     gabapentin (NEURONTIN) 800 MG tablet Take 1 tablet by mouth 3 (three) times daily.     isosorbide mononitrate (IMDUR) 60 MG 24 hr tablet TAKE 1 TABLET EVERY DAY 90 tablet 3   Multiple Vitamins-Minerals (HAIR SKIN AND NAILS FORMULA PO) Take 1 capsule by mouth daily.     nitroGLYCERIN (NITROSTAT) 0.4 MG SL tablet Place 1 tablet (0.4 mg total) under the tongue every 5 (five) minutes as needed for chest pain. 90 tablet 1   nystatin-triamcinolone ointment (MYCOLOG) Apply 1 application topically 2 (two) times daily. 30 g 0   ondansetron (ZOFRAN) 4 MG tablet Take 4 mg by mouth every 8 (eight) hours as needed for nausea or vomiting.     oxyCODONE (OXY IR/ROXICODONE) 5 MG immediate release tablet Take 2.5-5 mg by mouth every 6 (six) hours as needed.     pantoprazole (PROTONIX) 40 MG tablet Take 40 mg by mouth 2 (two) times daily.      Peppermint Oil (IBGARD PO) Take by mouth daily at 2 am.     potassium chloride SA (KLOR-CON M) 20 MEQ  tablet TAKE 1 TABLET (20 MEQ TOTAL) BY MOUTH DAILY. 90 tablet 1   rOPINIRole (REQUIP) 1 MG tablet Take 1 mg by mouth 2 (two) times daily.     rosuvastatin (CRESTOR) 40 MG tablet TAKE 1 TABLET EVERY DAY 90 tablet 0   spironolactone (ALDACTONE) 25 MG tablet TAKE 1 TABLET EVERY DAY 90 tablet 1   torsemide (DEMADEX) 20 MG tablet Take 2 tablets (40 mg total) by mouth daily. 180 tablet 2   vitamin B-12 (CYANOCOBALAMIN) 1000 MCG tablet Take 1,000 mcg by mouth daily.     Probiotic Product (PROBIOTIC PO) Take by mouth daily at 8 pm.     traMADol (ULTRAM) 50 MG tablet Take 1 tablet (50 mg total) by mouth every 6 (six) hours as needed for up to 15 doses for severe pain or moderate pain. (Patient not taking: Reported on 12/18/2021) 15 tablet 0   No current facility-administered medications for this visit.   Facility-Administered Medications Ordered in Other Visits  Medication Dose Route Frequency Provider Last Rate Last Admin   indocyanine green (IC-GREEN) injection 5 mg  5 mg Intravenous Once Sakai, Isami, DO        Allergies:   Amitriptyline, Amoxicillin-pot clavulanate, Cyclobenzaprine, Dicyclomine, Sacubitril-valsartan, Amitriptyline hcl, Bacon flavor, Hydrocodone, Fish oil, Lidocaine, Omega-3 fatty acids-vitamin e, Other, Oxycodone-acetaminophen, and Sulfa antibiotics    Social History:  The patient  reports that he quit smoking about 30 years ago. His smoking use included cigarettes. He smoked an average of 2.5 packs per day. He has never used smokeless tobacco. He reports that he does not currently use alcohol. He reports that he does not use drugs.   Family History:  The patient's family history includes Breast cancer in his paternal aunt; Heart attack in his father; Heart disease (age of onset: 57) in his father.    ROS:  Please see the history of present illness.   Otherwise, review of systems are positive for  none.   All other systems are reviewed and negative.    PHYSICAL EXAM: VS:  BP  116/72   Pulse (!) 56   Ht 5' 8.5" (1.74 m)   Wt 195 lb 9.6 oz (88.7 kg)   SpO2 98%   BMI 29.31 kg/m  , BMI Body mass index is 29.31 kg/m. GEN: Well nourished, well developed, in no acute distress  HEENT: normal  Neck: no JVD, carotid bruits, or masses Cardiac: RRR; no murmurs, rubs, or gallops, mild bilateral leg edema Respiratory:  clear to auscultation bilaterally, normal work of breathing GI: soft, nontender, nondistended, + BS MS: no deformity or atrophy  Skin: warm and dry, no rash Neuro:  Strength and sensation are intact Psych: euthymic mood, full affect   EKG:  EKG is not ordered today.   Recent Labs: 06/01/2021: B Natriuretic Peptide 90.0 06/02/2021: Magnesium 1.8 11/08/2021: ALT 29; BUN 10; Creatinine, Ser 1.10; Hemoglobin 14.8; Platelets 257; Potassium 3.2; Sodium 139    Lipid Panel    Component Value Date/Time   CHOL 165 02/07/2020 0658   TRIG 317 (H) 02/07/2020 0658   HDL 41 02/07/2020 0658   CHOLHDL 4.0 02/07/2020 0658   VLDL 63 (H) 02/07/2020 0658   LDLCALC 61 02/07/2020 0658      Wt Readings from Last 3 Encounters:  12/18/21 195 lb 9.6 oz (88.7 kg)  11/08/21 182 lb (82.6 kg)  10/15/21 198 lb (89.8 kg)         ASSESSMENT AND PLAN:  1.  Coronary artery disease involving native coronary arteries with other forms of angina: Stable episodes of chest pain overall.   Chronic exertional dyspnea seems to be stable.  2.  Chronic systolic heart failure: Most recent echocardiogram in February showed improvement in ejection fraction to 45 to 50%.  He does have mild bilateral leg edema.  Continue torsemide 40 mg daily and spironolactone 25 mg once daily.  He does have a component of chronic venous insufficiency and I encouraged him to use knee-high support stockings.    3. Essential hypertension: Blood pressure is controlled.   4. Hyperlipidemia: Continue high-dose rosuvastatin and Vascepa.  Most recent lipid profile showed an LDL of 61 and triglyceride of  317.   5. Mild carotid disease: Previous carotid Doppler showed less than 40% stenosis bilaterally. No need to repeat.  6.  Small abdominal aortic aneurysm: This was noted on ultrasound measuring 3 cm.  Recommend repeat ultrasound in 2 years .   7.  The patient needs to have some steroid injections done for chronic pain.  Plavix can be held 5 days before.  Prefer to keep aspirin 81 mg daily without interruption but if needed, this also can be held for 5 days.   Disposition: Follow-up in 6 months.   Signed,  [name], MD  12/18/2021 5:38 PM    [place]

## 2021-12-18 NOTE — Patient Instructions (Signed)
Medication Instructions:  NONE *If you need a refill on your cardiac medications before your next appointment, please call your pharmacy*   Lab Work: NONE If you have labs (blood work) drawn today and your tests are completely normal, you will receive your results only by: [place] (if you have MyChart) OR A paper copy in the mail If you have any lab test that is abnormal or we need to change your treatment, we will call you to review the results.   Testing/Procedures: NONE   Follow-Up: At [facility], you and your health needs are our priority.  As part of our continuing mission to provide you with exceptional heart care, we have created designated Provider Care Teams.  These Care Teams include your primary Cardiologist (physician) and Advanced Practice Providers (APPs -  Physician Assistants and Nurse Practitioners) who all work together to provide you with the care you need, when you need it.  We recommend signing up for the patient portal called "MyChart".  Sign up information is provided on this After Visit Summary.  MyChart is used to connect with patients for Virtual Visits (Telemedicine).  Patients are able to view lab/test results, encounter notes, upcoming appointments, etc.  Non-urgent messages can be sent to your provider as well.   To learn more about what you can do with MyChart, go to [email or web address].    Your next appointment:    6 month(s)  The format for your next appointment:   In Person  Provider:   You may see [name], MD or one of the following Advanced Practice Providers on your designated Care Team:   Murray Hodgkins, NP Christell Faith, PA-C Cadence Kathlen Mody, PA-C Gerrie Nordmann, NP   Important Information About Sugar

## 2021-12-19 ENCOUNTER — Telehealth: Payer: Self-pay | Admitting: Cardiovascular Disease

## 2021-12-19 NOTE — Telephone Encounter (Signed)
    Pt seen by Dr. Fletcher Anon yesterday. This is Assessment and Plan from his note:  ASSESSMENT AND PLAN: 1.  Coronary artery disease involving native coronary arteries with other forms of angina: Stable episodes of chest pain overall.   Chronic exertional dyspnea seems to be stable.   2.  Chronic systolic heart failure: Most recent echocardiogram in February showed improvement in ejection fraction to 45 to 50%.  He does have mild bilateral leg edema.  Continue torsemide 40 mg daily and spironolactone 25 mg once daily.  He does have a component of chronic venous insufficiency and I encouraged him to use knee-high support stockings.     3. Essential hypertension: Blood pressure is controlled.   4. Hyperlipidemia: Continue high-dose rosuvastatin and Vascepa.  Most recent lipid profile showed an LDL of 61 and triglyceride of 317.   5. Mild carotid disease: Previous carotid Doppler showed less than 40% stenosis bilaterally. No need to repeat.   6.  Small abdominal aortic aneurysm: This was noted on ultrasound measuring 3 cm.  Recommend repeat ultrasound in 2 years .    7.  The patient needs to have some steroid injections done for chronic pain.  Plavix can be held 5 days before.  Prefer to keep aspirin 81 mg daily without interruption but if needed, this also can be held for 5 days.     Disposition: Follow-up in 6 months.    Signed,   [name], MD  12/18/2021 5:38 PM    [place]   Patient may hold Plavix 5 day as outlined by Dr. Fletcher Anon. Call with questions.  Richardson Dopp, PA-C    12/19/2021 6:15 PM

## 2021-12-19 NOTE — Telephone Encounter (Signed)
   Pre-operative Risk Assessment    Patient Name: Willie Clark  DOB: May 26, 1950 MRN: [id number]      Request for Surgical Clearance    Procedure:   Cervical spine - epidural steroid injection  Date of Surgery:  Clearance TBD                                 Surgeon:  Dr Juleen China, RN Surgeon's Group or Practice Name:  [facility] Phone number:  [phone] Fax number:  [phone]   Type of Clearance Requested:   - Pharmacy:  Hold Clopidogrel (Plavix) hold 7 days - resume the next day   Type of Anesthesia:  Not Indicated   Additional requests/questions:    [name]   12/19/2021, 3:57 PM

## 2021-12-19 NOTE — Telephone Encounter (Signed)
Notes faxed to surgeon. This phone note will be removed from the preop pool. Richardson Dopp, PA-C  12/19/2021 6:16 PM

## 2021-12-20 NOTE — Telephone Encounter (Signed)
[facility] faxed form indicating patient was cleared for 5 days but they need it to be 7 days Please review

## 2021-12-21 NOTE — Telephone Encounter (Signed)
Please inform the patient to hold Plavix for 7 days prior to the procedure and restart as soon as possible afterward

## 2021-12-21 NOTE — Telephone Encounter (Signed)
   Patient Name: Willie Clark  DOB: Sep 16, 1950 MRN: [id number]  Primary Cardiologist: [name], MD  Chart reviewed as part of pre-operative protocol coverage. Given past medical history and time since last visit, based on ACC/AHA guidelines, [name] would be at acceptable risk for the planned procedure without further cardiovascular testing.   The case has been reviewed, as mentioned by Dr. Fletcher Anon, would prefer to keep the patient on aspirin 81 mg daily, however may hold Plavix for 7 days prior to the procedure and restart as soon as possible afterward at the surgeon's discretion.   I will route this recommendation to the requesting party via Epic fax function and remove from pre-op pool.  Please call with questions.  [place], [place] 12/21/2021, 11:13 PM

## 2021-12-26 NOTE — Telephone Encounter (Signed)
S/w the pt and went over the recommendations ok per Dr. Fletcher Anon to hold Plavix x 7 days prior to procedure with Dr. Davy Pique. Pt advised per Dr. Fletcher Anon would prefer he stay on ASA through procedure, however if Dr. Davy Pique prefers ASA to be held, then can hold ASA x 5 days prior to procedure. Pt thanked me for the call and the help. Notes have been faxed to Dr. Dionne Ano office.

## 2022-01-09 ENCOUNTER — Other Ambulatory Visit: Payer: Self-pay | Admitting: Cardiovascular Disease

## 2022-02-27 ENCOUNTER — Ambulatory Visit (INDEPENDENT_AMBULATORY_CARE_PROVIDER_SITE_OTHER): Payer: Medicare HMO | Admitting: Dermatology

## 2022-02-27 DIAGNOSIS — L820 Inflamed seborrheic keratosis: Secondary | ICD-10-CM | POA: Diagnosis not present

## 2022-02-27 DIAGNOSIS — L578 Other skin changes due to chronic exposure to nonionizing radiation: Secondary | ICD-10-CM | POA: Diagnosis not present

## 2022-02-27 DIAGNOSIS — L821 Other seborrheic keratosis: Secondary | ICD-10-CM | POA: Diagnosis not present

## 2022-02-27 DIAGNOSIS — L280 Lichen simplex chronicus: Secondary | ICD-10-CM

## 2022-02-27 MED ORDER — CLOBETASOL PROPIONATE 0.05 % EX CREA: TOPICAL_CREAM | CUTANEOUS | 1 refills | Status: DC

## 2022-02-27 NOTE — Progress Notes (Signed)
Follow-Up Visit   Subjective  Willie Clark is a 71 y.o. male who presents for the following: Growths (Back, arms. Itchy, patient picks at and bleeds. ). No history of skin cancer.  He has a persistent rash on his elbows that itches. No h/o psoriasis.  The following portions of the chart were reviewed this encounter and updated as appropriate:       Review of Systems:  No other skin or systemic complaints except as noted in HPI or Assessment and Plan.  Objective  Well appearing patient in no apparent distress; mood and affect are within normal limits.  A focused examination was performed including face, trunk, arms. Relevant physical exam findings are noted in the Assessment and Plan.  R mid back x 5; L mid back x 5; L post shoulder x 2; L upper arm x 1; R upper arm x 2 (15) Erythematous stuck-on, waxy papule or plaque  bilateral elbows Hyperkeratosis with mild erythema of the bilateral elbows, L > R    Assessment & Plan  Actinic Damage - chronic, secondary to cumulative UV radiation exposure/sun exposure over time - diffuse scaly erythematous macules with underlying dyspigmentation - Recommend daily broad spectrum sunscreen SPF 30+ to sun-exposed areas, reapply every 2 hours as needed.  - Recommend staying in the shade or wearing long sleeves, sun glasses (UVA+UVB protection) and wide brim hats (4-inch brim around the entire circumference of the hat). - Call for new or changing lesions.  Seborrheic Keratoses - Stuck-on, waxy, tan-brown papules and/or plaques  - Benign-appearing - Discussed benign etiology and prognosis. - Observe - Call for any changes  Inflamed seborrheic keratosis (15) R mid back x 5; L mid back x 5; L post shoulder x 2; L upper arm x 1; R upper arm x 2  Symptomatic, irritating, patient would like treated.  Destruction of lesion - R mid back x 5; L mid back x 5; L post shoulder x 2; L upper arm x 1; R upper arm x 2  Destruction method:  cryotherapy   Informed consent: discussed and consent obtained   Lesion destroyed using liquid nitrogen: Yes   Region frozen until ice ball extended beyond lesion: Yes   Outcome: patient tolerated procedure well with no complications   Post-procedure details: wound care instructions given   Additional details:  Prior to procedure, discussed risks of blister formation, small wound, skin dyspigmentation, or rare scar following cryotherapy. Recommend Vaseline ointment to treated areas while healing.   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 Condition is bothersome/symptomatic for patient. Currently flared.   Start Clobetasol Cream Apply BID to AA until improved. Avoid face, groin, axilla. Dsp 30g 1Rf. Topical steroids (such as triamcinolone, fluocinolone, fluocinonide, mometasone, clobetasol, halobetasol, betamethasone, hydrocortisone) can cause thinning and lightening of the skin if they are used for too long in the same area. Your physician has selected the right strength medicine for your problem and area affected on the body. Please use your medication only as directed by your physician to prevent side effects.   Avoid picking/rubbing elbows  Recommend starting moisturizer with exfoliant (Urea, Salicylic acid, or Lactic acid) one to two times daily to help smooth rough and bumpy skin.  OTC options include Cetaphil Rough and Bumpy lotion (Urea), Eucerin Roughness Relief lotion or spot treatment cream (Urea), CeraVe SA lotion/cream for Rough and Bumpy skin (Sal Acid), Gold Bond Rough and Bumpy cream (Sal Acid),  and AmLactin 12% lotion/cream (Lactic Acid).  If applying in morning, also apply sunscreen to sun-exposed areas, since these exfoliating moisturizers can increase sensitivity to sun.   clobetasol cream (TEMOVATE) 0.05 % - bilateral elbows Apply to itchy areas on elbows twice a day until improved. Avoid  face, groin, underarms.   Return in about 2 months (around 04/29/2022) for [facility].  IJamesetta Orleans, CMA, am acting as scribe for Brendolyn Patty, MD .  Documentation: I have reviewed the above documentation for accuracy and completeness, and I agree with the above.  Brendolyn Patty MD

## 2022-02-27 NOTE — Patient Instructions (Addendum)
Recommend starting moisturizer with exfoliant (Urea, Salicylic acid, or Lactic acid) one to two times daily to help smooth rough and bumpy skin.  OTC options include Cetaphil Rough and Bumpy lotion (Urea), Eucerin Roughness Relief lotion or spot treatment cream (Urea), CeraVe SA lotion/cream for Rough and Bumpy skin (Sal Acid), Gold Bond Rough and Bumpy cream (Sal Acid), and AmLactin 12% lotion/cream (Lactic Acid).  If applying in morning, also apply sunscreen to sun-exposed areas, since these exfoliating moisturizers can increase sensitivity to sun.  Clobetasol Cream - Apply to itchy spots on elbows twice daily until improved. Avoid face, groin, underarms.  Topical steroids (such as triamcinolone, fluocinolone, fluocinonide, mometasone, clobetasol, halobetasol, betamethasone, hydrocortisone) can cause thinning and lightening of the skin if they are used for too long in the same area. Your physician has selected the right strength medicine for your problem and area affected on the body. Please use your medication only as directed by your physician to prevent side effects.     Seborrheic Keratosis  What causes seborrheic keratoses? Seborrheic keratoses are harmless, common skin growths that first appear during adult life.  As time goes by, more growths appear.  Some people may develop a large number of them.  Seborrheic keratoses appear on both covered and uncovered body parts.  They are not caused by sunlight.  The tendency to develop seborrheic keratoses can be inherited.  They vary in color from skin-colored to gray, brown, or even black.  They can be either smooth or have a rough, warty surface.   Seborrheic keratoses are superficial and look as if they were stuck on the skin.  Under the microscope this type of keratosis looks like layers upon layers of skin.  That is why at times the top layer may seem to fall off, but the rest of the growth remains and re-grows.    Treatment Seborrheic keratoses  do not need to be treated, but can easily be removed in the office.  Seborrheic keratoses often cause symptoms when they rub on clothing or jewelry.  Lesions can be in the way of shaving.  If they become inflamed, they can cause itching, soreness, or burning.  Removal of a seborrheic keratosis can be accomplished by freezing, burning, or surgery. If any spot bleeds, scabs, or grows rapidly, please return to have it checked, as these can be an indication of a skin cancer.  Cryotherapy Aftercare  Wash gently with soap and water everyday.   Apply Vaseline and Band-Aid daily until healed.   Due to recent changes in healthcare laws, you may see results of your pathology and/or laboratory studies on MyChart before the doctors have had a chance to review them. We understand that in some cases there may be results that are confusing or concerning to you. Please understand that not all results are received at the same time and often the doctors may need to interpret multiple results in order to provide you with the best plan of care or course of treatment. Therefore, we ask that you please give [place] 2 business days to thoroughly review all your results before contacting the office for clarification. Should we see a critical lab result, you will be contacted sooner.   If You Need Anything After Your Visit  If you have any questions or concerns for your doctor, please call our main line at [phone] and press option 4 to reach your doctor's medical assistant. If no one answers, please leave a voicemail as directed and we will return  your call as soon as possible. Messages left after 4 pm will be answered the following business day.   You may also send [place] a message via [place]. We typically respond to MyChart messages within 1-2 business days.  For prescription refills, please ask your pharmacy to contact our office. Our fax number is [phone].  If you have an urgent issue when the clinic is closed that  cannot wait until the next business day, you can page your doctor at the number below.    Please note that while we do our best to be available for urgent issues outside of office hours, we are not available 24/7.   If you have an urgent issue and are unable to reach [place], you may choose to seek medical care at your doctor's office, retail clinic, urgent care center, or emergency room.  If you have a medical emergency, please immediately call 911 or go to the emergency department.  Pager Numbers  - Dr. Nehemiah Massed: [phone]  - Dr. Laurence Ferrari: [phone]  - Dr. Nicole Kindred: [phone]  In the event of inclement weather, please call our main line at [phone] for an update on the status of any delays or closures.  Dermatology Medication Tips: Please keep the boxes that topical medications come in in order to help keep track of the instructions about where and how to use these. Pharmacies typically print the medication instructions only on the boxes and not directly on the medication tubes.   If your medication is too expensive, please contact our office at [phone] option 4 or send [place] a message through [place].   We are unable to tell what your co-pay for medications will be in advance as this is different depending on your insurance coverage. However, we may be able to find a substitute medication at lower cost or fill out paperwork to get insurance to cover a needed medication.   If a prior authorization is required to get your medication covered by your insurance company, please allow [place] 1-2 business days to complete this process.  Drug prices often vary depending on where the prescription is filled and some pharmacies may offer cheaper prices.  The website www.goodrx.com contains coupons for medications through different pharmacies. The prices here do not account for what the cost may be with help from insurance (it may be cheaper with your insurance), but the website can give you the  price if you did not use any insurance.  - You can print the associated coupon and take it with your prescription to the pharmacy.  - You may also stop by our office during regular business hours and pick up a GoodRx coupon card.  - If you need your prescription sent electronically to a different pharmacy, notify our office through [facility] or by phone at [phone] option 4.     Si Usted Necesita Algo Despus de Su Visita  Tambin puede enviarnos un mensaje a travs de Pharmacist, community. Por lo general respondemos a los mensajes de MyChart en el transcurso de 1 a 2 das hbiles.  Para renovar recetas, por favor pida a su farmacia que se ponga en contacto con nuestra oficina. Harland Dingwall de fax es [place] [phone].  Si tiene un asunto urgente cuando la clnica est cerrada y que no puede esperar hasta el siguiente da hbil, puede llamar/localizar a su doctor(a) al nmero que aparece a continuacin.   Por favor, tenga en cuenta que aunque hacemos todo lo posible para estar disponibles para asuntos urgentes  fuera del horario de oficina, no estamos disponibles las 24 horas del da, los 7 das de la [place].   Si tiene un problema urgente y no puede comunicarse con nosotros, puede optar por buscar atencin mdica  en el consultorio de su doctor(a), en una clnica privada, en un centro de atencin urgente o en una sala de emergencias.  Si tiene Engineering geologist, por favor llame inmediatamente al 911 o vaya a la sala de emergencias.  Nmeros de bper  - Dr. Nehemiah Massed: [phone]  - Dra. Moye: [phone]  - Dra. Nicole Kindred: [phone]  En caso de inclemencias del [place], por favor llame a Johnsie Kindred principal al [phone] para una actualizacin sobre el [place] de cualquier retraso o cierre.  Consejos para la medicacin en dermatologa: Por favor, guarde las cajas en las que vienen los medicamentos de uso tpico para ayudarle a seguir las instrucciones sobre dnde y cmo  usarlos. Las farmacias generalmente imprimen las instrucciones del medicamento slo en las cajas y no directamente en los tubos del [place].   Si su medicamento es muy caro, por favor, pngase en contacto con Zigmund Daniel llamando al [phone] y presione la opcin 4 o envenos un mensaje a travs de Pharmacist, community.   No podemos decirle cul ser su copago por los medicamentos por adelantado ya que esto es diferente dependiendo de la cobertura de su seguro. Sin embargo, es posible que podamos encontrar un medicamento sustituto a Electrical engineer un formulario para que el seguro cubra el medicamento que se considera necesario.   Si se requiere una autorizacin previa para que su compaa de seguros [place] su medicamento, por favor permtanos de 1 a 2 das hbiles para completar este proceso.  Los precios de los medicamentos varan con frecuencia dependiendo del Environmental consultant de dnde se surte la receta y alguna farmacias pueden ofrecer precios ms baratos.  El sitio web www.goodrx.com tiene cupones para medicamentos de Airline pilot. Los precios aqu no tienen en cuenta lo que podra costar con la ayuda del seguro (puede ser ms barato con su seguro), pero el sitio web puede darle el precio si no utiliz Research scientist (physical sciences).  - Puede imprimir el cupn correspondiente y llevarlo con su receta a la farmacia.  - Tambin puede pasar por nuestra oficina durante el horario de atencin regular y Charity fundraiser una tarjeta de cupones de GoodRx.  - Si necesita que su receta se enve electrnicamente a una farmacia diferente, informe a nuestra oficina a travs de MyChart de [place] o por telfono llamando al [phone] y presione la opcin 4.

## 2022-02-28 ENCOUNTER — Other Ambulatory Visit: Payer: Self-pay | Admitting: Cardiovascular Disease

## 2022-04-29 ENCOUNTER — Ambulatory Visit: Payer: Medicare HMO | Admitting: Dermatology

## 2022-04-29 VITALS — BP 123/69

## 2022-04-29 DIAGNOSIS — L720 Epidermal cyst: Secondary | ICD-10-CM

## 2022-04-29 DIAGNOSIS — L820 Inflamed seborrheic keratosis: Secondary | ICD-10-CM | POA: Diagnosis not present

## 2022-04-29 DIAGNOSIS — L0291 Cutaneous abscess, unspecified: Secondary | ICD-10-CM

## 2022-04-29 DIAGNOSIS — L280 Lichen simplex chronicus: Secondary | ICD-10-CM | POA: Diagnosis not present

## 2022-04-29 DIAGNOSIS — L02413 Cutaneous abscess of right upper limb: Secondary | ICD-10-CM

## 2022-04-29 MED ORDER — DOXYCYCLINE HYCLATE 100 MG PO CAPS: 100.0000 mg | ORAL_CAPSULE | Freq: Two times a day (BID) | ORAL | 0 refills | Status: AC

## 2022-04-29 MED ORDER — MUPIROCIN 2 % EX OINT: TOPICAL_OINTMENT | CUTANEOUS | 0 refills | Status: DC

## 2022-04-29 NOTE — Patient Instructions (Addendum)
Pre-Operative Instructions  You are scheduled for a surgical procedure at [facility]. We recommend you read the following instructions. If you have any questions or concerns, please call the office at [phone].  Shower and wash the entire body with soap and water the day of your surgery paying special attention to cleansing at and around the planned surgery site.  Avoid aspirin or aspirin containing products at least fourteen (14) days prior to your surgical procedure and for at least one week (7 Days) after your surgical procedure. If you take aspirin on a regular basis for heart disease or history of stroke or for any other reason, we may recommend you continue taking aspirin but please notify us if you take this on a regular basis. Aspirin can cause more bleeding to occur during surgery as well as prolonged bleeding and bruising after surgery.   Avoid other nonsteroidal pain medications at least one week prior to surgery and at least one week prior to your surgery. These include medications such as Ibuprofen (Motrin, Advil and Nuprin), Naprosyn, Voltaren, Relafen, etc. If medications are used for therapeutic reasons, please inform us as they can cause increased bleeding or prolonged bleeding during and bruising after surgical procedures.   Please advise [place] if you are taking any "blood thinner" medications such as Coumadin or Dipyridamole or Plavix or similar medications. These cause increased bleeding and prolonged bleeding during procedures and bruising after surgical procedures. We may have to consider discontinuing these medications briefly prior to and shortly after your surgery if safe to do so.   Please inform us of all medications you are currently taking. All medications that are taken regularly should be taken the day of surgery as you always do. Nevertheless, we need to be informed of what medications you are taking prior to surgery to know whether they will affect the  procedure or cause any complications.   Please inform us of any medication allergies. Also inform us of whether you have allergies to Latex or rubber products or whether you have had any adverse reaction to Lidocaine or Epinephrine.  Please inform us of any prosthetic or artificial body parts such as artificial heart valve, joint replacements, etc., or similar condition that might require preoperative antibiotics.   We recommend avoidance of alcohol at least two weeks prior to surgery and continued avoidance for at least two weeks after surgery.   We recommend discontinuation of tobacco smoking at least two weeks prior to surgery and continued abstinence for at least two weeks after surgery.  Do not plan strenuous exercise, strenuous work or strenuous lifting for approximately four weeks after your surgery.   We request if you are unable to make your scheduled surgical appointment, please call us at least a week in advance or as soon as you are aware of a problem so that we can cancel or reschedule the appointment.   You MAY TAKE TYLENOL (acetaminophen) for pain as it is not a blood thinner.   PLEASE PLAN TO BE IN TOWN FOR TWO WEEKS FOLLOWING SURGERY, THIS IS IMPORTANT SO YOU CAN BE CHECKED FOR DRESSING CHANGES, SUTURE REMOVAL AND TO MONITOR FOR POSSIBLE COMPLICATIONS.   Cryotherapy Aftercare  Wash gently with soap and water everyday.   Apply Vaseline and Band-Aid daily until healed.    Doxycycline should be taken with food to prevent nausea. Do not lay down for 30 minutes after taking. Be cautious with sun exposure and use good sun protection while on this medication.  Pregnant women should not take this medication.   Due to recent changes in healthcare laws, you may see results of your pathology and/or laboratory studies on MyChart before the doctors have had a chance to review them. We understand that in some cases there may be results that are confusing or concerning to you. Please  understand that not all results are received at the same time and often the doctors may need to interpret multiple results in order to provide you with the best plan of care or course of treatment. Therefore, we ask that you please give [place] 2 business days to thoroughly review all your results before contacting the office for clarification. Should we see a critical lab result, you will be contacted sooner.   If You Need Anything After Your Visit  If you have any questions or concerns for your doctor, please call our main line at [phone] and press option 4 to reach your doctor's medical assistant. If no one answers, please leave a voicemail as directed and we will return your call as soon as possible. Messages left after 4 pm will be answered the following business day.   You may also send [place] a message via [place]. We typically respond to MyChart messages within 1-2 business days.  For prescription refills, please ask your pharmacy to contact our office. Our fax number is [phone].  If you have an urgent issue when the clinic is closed that cannot wait until the next business day, you can page your doctor at the number below.    Please note that while we do our best to be available for urgent issues outside of office hours, we are not available 24/7.   If you have an urgent issue and are unable to reach [place], you may choose to seek medical care at your doctor's office, retail clinic, urgent care center, or emergency room.  If you have a medical emergency, please immediately call 911 or go to the emergency department.  Pager Numbers  - Dr. Nehemiah Massed: [phone]  - Dr. Laurence Ferrari: [phone]  - Dr. Nicole Kindred: [phone]  In the event of inclement weather, please call our main line at [phone] for an update on the status of any delays or closures.  Dermatology Medication Tips: Please keep the boxes that topical medications come in in order to help keep track of the instructions about  where and how to use these. Pharmacies typically print the medication instructions only on the boxes and not directly on the medication tubes.   If your medication is too expensive, please contact our office at [phone] option 4 or send [place] a message through [place].   We are unable to tell what your co-pay for medications will be in advance as this is different depending on your insurance coverage. However, we may be able to find a substitute medication at lower cost or fill out paperwork to get insurance to cover a needed medication.   If a prior authorization is required to get your medication covered by your insurance company, please allow [place] 1-2 business days to complete this process.  Drug prices often vary depending on where the prescription is filled and some pharmacies may offer cheaper prices.  The website www.goodrx.com contains coupons for medications through different pharmacies. The prices here do not account for what the cost may be with help from insurance (it may be cheaper with your insurance), but the website can give you the price if you did not use any insurance.  -  You can print the associated coupon and take it with your prescription to the pharmacy.  - You may also stop by our office during regular business hours and pick up a GoodRx coupon card.  - If you need your prescription sent electronically to a different pharmacy, notify our office through [facility] or by phone at [phone] option 4.     Si Usted Necesita Algo Despus de Su Visita  Tambin puede enviarnos un mensaje a travs de Pharmacist, community. Por lo general respondemos a los mensajes de MyChart en el transcurso de 1 a 2 das hbiles.  Para renovar recetas, por favor pida a su farmacia que se ponga en contacto con nuestra oficina. Harland Dingwall de fax es [place] [phone].  Si tiene un asunto urgente cuando la clnica est cerrada y que no puede esperar hasta el siguiente da hbil, puede  llamar/localizar a su doctor(a) al nmero que aparece a continuacin.   Por favor, tenga en cuenta que aunque hacemos todo lo posible para estar disponibles para asuntos urgentes fuera del horario de [place], no estamos disponibles las 24 horas del da, los 7 das de la [place].   Si tiene un problema urgente y no puede comunicarse con nosotros, puede optar por buscar atencin mdica  en el consultorio de su doctor(a), en una clnica privada, en un centro de atencin urgente o en una sala de emergencias.  Si tiene Engineering geologist, por favor llame inmediatamente al 911 o vaya a la sala de emergencias.  Nmeros de bper  - Dr. Nehemiah Massed: [phone]  - Dra. Moye: [phone]  - Dra. Nicole Kindred: [phone]  En caso de inclemencias del [place], por favor llame a Johnsie Kindred principal al [phone] para una actualizacin sobre el [place] de cualquier retraso o cierre.  Consejos para la medicacin en dermatologa: Por favor, guarde las cajas en las que vienen los medicamentos de uso tpico para ayudarle a seguir las instrucciones sobre dnde y cmo usarlos. Las farmacias generalmente imprimen las instrucciones del medicamento slo en las cajas y no directamente en los tubos del [place].   Si su medicamento es muy caro, por favor, pngase en contacto con Zigmund Daniel llamando al [phone] y presione la opcin 4 o envenos un mensaje a travs de Pharmacist, community.   No podemos decirle cul ser su copago por los medicamentos por adelantado ya que esto es diferente dependiendo de la cobertura de su seguro. Sin embargo, es posible que podamos encontrar un medicamento sustituto a Electrical engineer un formulario para que el seguro cubra el medicamento que se considera necesario.   Si se requiere una autorizacin previa para que su compaa de seguros [place] su medicamento, por favor permtanos de 1 a 2 das hbiles para completar este proceso.  Los precios de los medicamentos varan con  frecuencia dependiendo del Environmental consultant de dnde se surte la receta y alguna farmacias pueden ofrecer precios ms baratos.  El sitio web www.goodrx.com tiene cupones para medicamentos de Airline pilot. Los precios aqu no tienen en cuenta lo que podra costar con la ayuda del seguro (puede ser ms barato con su seguro), pero el sitio web puede darle el precio si no utiliz Research scientist (physical sciences).  - Puede imprimir el cupn correspondiente y llevarlo con su receta a la farmacia.  - Tambin puede pasar por nuestra oficina durante el horario de atencin regular y Charity fundraiser una tarjeta de cupones de GoodRx.  - Si necesita que su receta se enve electrnicamente a una farmacia diferente, informe  a nuestra oficina a travs de MyChart de [place] o por telfono llamando al [phone] y presione la opcin 4.

## 2022-04-29 NOTE — Progress Notes (Signed)
Follow-Up Visit   Subjective  Willie Clark is a 72 y.o. male who presents for the following: Follow-up.  Patient presents for 2 month follow-up ISKs of the back and upper arms. Also, LSC of the elbows, improving with clobetasol cream. He has a tender swollen area on his right elbow and was bleeding this morning. He has a bump on his cheek that gets irritated.  He has multiple scaly spots on his arms and face that itch and he scratches at.  The following portions of the chart were reviewed this encounter and updated as appropriate:       Review of Systems:  No other skin or systemic complaints except as noted in HPI or Assessment and Plan.  Objective  Well appearing patient in no apparent distress; mood and affect are within normal limits.  A focused examination was performed including face, trunk, arms. Relevant physical exam findings are noted in the Assessment and Plan.  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 Condition is symptomatic / bothersome to patient. Not to goal, but improving.   Continue clobetasol cream QD/BID AA L elbow. D/C to R elbow and use prn flares. Avoid face, groin, axilla.  Topical steroids (such as triamcinolone, fluocinolone, fluocinonide, mometasone, clobetasol, halobetasol, betamethasone, hydrocortisone) can cause thinning and lightening of the skin if they are used for too long in the same area. Your physician has selected the right strength  medicine for your problem and area affected on the body. Please use your medication only as directed by your physician to prevent side effects.   Avoid rubbing/scratching areas     Related Medications clobetasol cream (TEMOVATE) 0.05 % Apply to itchy areas on elbows twice a day until improved. Avoid face, groin, underarms.  Abscess Right Elbow  vs Inflamed Cyst  Start mupirocin 2% ointment Apply to AA BID until improved dsp 22g 0Rf Start doxycycline 100 mg take 1 po BID with food x 2 weeks dsp #28 RTC if not improving, if cyst, may need surgery to remove once inflammation resolved.  doxycycline (VIBRAMYCIN) 100 MG capsule - Right Elbow Take 1 capsule (100 mg total) by mouth 2 (two) times daily for 14 days. Take with food.  mupirocin ointment (BACTROBAN) 2 % - Right Elbow Apply to affected area right elbow twice a day until improved.  Epidermal cyst L mid cheek  Cyst with symptoms and/or recent change.  Discussed surgical excision to remove, including resulting scar and possible recurrence.  Patient will schedule for surgery. Pre-op information given.   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  Destruction of lesion - L temple x 5, R temple x 4, L chest x 1, L elbow/forearm x 9, L shoulder x 1, R elbow/forearm x 7  Destruction method: cryotherapy   Informed consent: discussed and  consent obtained   Lesion destroyed using liquid nitrogen: Yes   Region frozen until ice ball extended beyond lesion: Yes   Outcome: patient tolerated procedure well with no complications   Post-procedure details: wound care instructions given   Additional details:  Prior to procedure, discussed risks of blister formation, small wound, skin dyspigmentation, or rare scar following cryotherapy. Recommend Vaseline ointment to treated areas while  healing.    Return for surgery - cyst L cheek. Also f/u ISKs 3 mos.  IJamesetta Orleans, CMA, am acting as scribe for Brendolyn Patty, MD .  Documentation: I have reviewed the above documentation for accuracy and completeness, and I agree with the above.  Brendolyn Patty MD

## 2022-05-11 ENCOUNTER — Other Ambulatory Visit: Payer: Self-pay | Admitting: Cardiovascular Disease

## 2022-05-21 ENCOUNTER — Telehealth: Payer: Self-pay

## 2022-05-21 NOTE — Telephone Encounter (Signed)
Patient has surgery coming up on 06/03/22 with you. He is on plavix and has gotten the OK to stop before for surgical procedures at other offices. He wants to know how far in advance should he d/c medication.  Patient states he has been told to not stop Asprin by cardiologist in the past.

## 2022-05-21 NOTE — Telephone Encounter (Signed)
Patient advised of information per Dr. Nicole Kindred. aw

## 2022-05-22 ENCOUNTER — Other Ambulatory Visit: Payer: Self-pay | Admitting: Cardiovascular Disease

## 2022-06-03 ENCOUNTER — Encounter: Payer: Self-pay | Admitting: Dermatology

## 2022-06-03 ENCOUNTER — Ambulatory Visit (INDEPENDENT_AMBULATORY_CARE_PROVIDER_SITE_OTHER): Payer: Medicare HMO | Admitting: Dermatology

## 2022-06-03 ENCOUNTER — Other Ambulatory Visit: Payer: Self-pay | Admitting: Cardiovascular Disease

## 2022-06-03 VITALS — BP 132/71 | HR 72

## 2022-06-03 DIAGNOSIS — D492 Neoplasm of unspecified behavior of bone, soft tissue, and skin: Secondary | ICD-10-CM

## 2022-06-03 DIAGNOSIS — D485 Neoplasm of uncertain behavior of skin: Secondary | ICD-10-CM

## 2022-06-03 DIAGNOSIS — L578 Other skin changes due to chronic exposure to nonionizing radiation: Secondary | ICD-10-CM

## 2022-06-03 DIAGNOSIS — I10 Essential (primary) hypertension: Secondary | ICD-10-CM

## 2022-06-03 DIAGNOSIS — I5022 Chronic systolic (congestive) heart failure: Secondary | ICD-10-CM

## 2022-06-03 NOTE — Patient Instructions (Signed)
Wound Care Instructions  Cleanse wound gently with soap and water once a day then pat dry with clean gauze. Apply a thin coat of Petrolatum (petroleum jelly, "Vaseline") over the wound (unless you have an allergy to this). We recommend that you use a new, sterile tube of Vaseline. Do not pick or remove scabs. Do not remove the yellow or white "healing tissue" from the base of the wound.  Cover the wound with fresh, clean, nonstick gauze and secure with paper tape. You may use Band-Aids in place of gauze and tape if the wound is small enough, but would recommend trimming much of the tape off as there is often too much. Sometimes Band-Aids can irritate the skin.  You should call the office for your biopsy report after 1 week if you have not already been contacted.  If you experience any problems, such as abnormal amounts of bleeding, swelling, significant bruising, significant pain, or evidence of infection, please call the office immediately.  FOR ADULT SURGERY PATIENTS: If you need something for pain relief you may take 1 extra strength Tylenol (acetaminophen) AND 2 Ibuprofen (200mg each) together every 4 hours as needed for pain. (do not take these if you are allergic to them or if you have a reason you should not take them.) Typically, you may only need pain medication for 1 to 3 days.     Due to recent changes in healthcare laws, you may see results of your pathology and/or laboratory studies on MyChart before the doctors have had a chance to review them. We understand that in some cases there may be results that are confusing or concerning to you. Please understand that not all results are received at the same time and often the doctors may need to interpret multiple results in order to provide you with the best plan of care or course of treatment. Therefore, we ask that you please give us 2 business days to thoroughly review all your results before contacting the office for clarification. Should  we see a critical lab result, you will be contacted sooner.   If You Need Anything After Your Visit  If you have any questions or concerns for your doctor, please call our main line at [phone] and press option 4 to reach your doctor's medical assistant. If no one answers, please leave a voicemail as directed and we will return your call as soon as possible. Messages left after 4 pm will be answered the following business day.   You may also send us a message via MyChart. We typically respond to MyChart messages within 1-2 business days.  For prescription refills, please ask your pharmacy to contact our office. Our fax number is [phone].  If you have an urgent issue when the clinic is closed that cannot wait until the next business day, you can page your doctor at the number below.    Please note that while we do our best to be available for urgent issues outside of office hours, we are not available 24/7.   If you have an urgent issue and are unable to reach us, you may choose to seek medical care at your doctor's office, retail clinic, urgent care center, or emergency room.  If you have a medical emergency, please immediately call 911 or go to the emergency department.  Pager Numbers  - Dr. Kowalski: [phone]  - Dr. Moye: [phone]  - Dr. Stewart: [phone]  In the event of inclement weather, please call our main line at   [phone] for an update on the status of any delays or closures.  Dermatology Medication Tips: Please keep the boxes that topical medications come in in order to help keep track of the instructions about where and how to use these. Pharmacies typically print the medication instructions only on the boxes and not directly on the medication tubes.   If your medication is too expensive, please contact our office at [phone] option 4 or send us a message through MyChart.   We are unable to tell what your co-pay for medications will be in  advance as this is different depending on your insurance coverage. However, we may be able to find a substitute medication at lower cost or fill out paperwork to get insurance to cover a needed medication.   If a prior authorization is required to get your medication covered by your insurance company, please allow us 1-2 business days to complete this process.  Drug prices often vary depending on where the prescription is filled and some pharmacies may offer cheaper prices.  The website www.goodrx.com contains coupons for medications through different pharmacies. The prices here do not account for what the cost may be with help from insurance (it may be cheaper with your insurance), but the website can give you the price if you did not use any insurance.  - You can print the associated coupon and take it with your prescription to the pharmacy.  - You may also stop by our office during regular business hours and pick up a GoodRx coupon card.  - If you need your prescription sent electronically to a different pharmacy, notify our office through [place] MyChart or by phone at [phone] option 4.     Si Usted Necesita Algo Despus de Su Visita  Tambin puede enviarnos un mensaje a travs de MyChart. Por lo general respondemos a los mensajes de MyChart en el transcurso de 1 a 2 das hbiles.  Para renovar recetas, por favor pida a su farmacia que se ponga en contacto con nuestra oficina. Nuestro nmero de fax es el [phone].  Si tiene un asunto urgente cuando la clnica est cerrada y que no puede esperar hasta el siguiente da hbil, puede llamar/localizar a su doctor(a) al nmero que aparece a continuacin.   Por favor, tenga en cuenta que aunque hacemos todo lo posible para estar disponibles para asuntos urgentes fuera del horario de oficina, no estamos disponibles las 24 horas del da, los 7 das de la semana.   Si tiene un problema urgente y no puede comunicarse con nosotros, puede  optar por buscar atencin mdica  en el consultorio de su doctor(a), en una clnica privada, en un centro de atencin urgente o en una sala de emergencias.  Si tiene una emergencia mdica, por favor llame inmediatamente al 911 o vaya a la sala de emergencias.  Nmeros de bper  - Dr. Kowalski: [phone]  - Dra. Moye: [phone]  - Dra. Stewart: [phone]  En caso de inclemencias del tiempo, por favor llame a nuestra lnea principal al [phone] para una actualizacin sobre el estado de cualquier retraso o cierre.  Consejos para la medicacin en dermatologa: Por favor, guarde las cajas en las que vienen los medicamentos de uso tpico para ayudarle a seguir las instrucciones sobre dnde y cmo usarlos. Las farmacias generalmente imprimen las instrucciones del medicamento slo en las cajas y no directamente en los tubos del medicamento.   Si su medicamento es muy caro, por favor, pngase en contacto con   nuestra oficina llamando al [phone] y presione la opcin 4 o envenos un mensaje a travs de MyChart.   No podemos decirle cul ser su copago por los medicamentos por adelantado ya que esto es diferente dependiendo de la cobertura de su seguro. Sin embargo, es posible que podamos encontrar un medicamento sustituto a menor costo o llenar un formulario para que el seguro cubra el medicamento que se considera necesario.   Si se requiere una autorizacin previa para que su compaa de seguros cubra su medicamento, por favor permtanos de 1 a 2 das hbiles para completar este proceso.  Los precios de los medicamentos varan con frecuencia dependiendo del lugar de dnde se surte la receta y alguna farmacias pueden ofrecer precios ms baratos.  El sitio web www.goodrx.com tiene cupones para medicamentos de diferentes farmacias. Los precios aqu no tienen en cuenta lo que podra costar con la ayuda del seguro (puede ser ms barato con su seguro), pero el sitio web puede darle el  precio si no utiliz ningn seguro.  - Puede imprimir el cupn correspondiente y llevarlo con su receta a la farmacia.  - Tambin puede pasar por nuestra oficina durante el horario de atencin regular y recoger una tarjeta de cupones de GoodRx.  - Si necesita que su receta se enve electrnicamente a una farmacia diferente, informe a nuestra oficina a travs de MyChart de [place] o por telfono llamando al [phone] y presione la opcin 4.  

## 2022-06-03 NOTE — Progress Notes (Signed)
   Follow-Up Visit   Subjective  Willie Clark is a 72 y.o. male who presents for the following: Cyst (Here for surgical excision of probable cyst at left mid cheek).    The following portions of the chart were reviewed this encounter and updated as appropriate:      Review of Systems: No other skin or systemic complaints except as noted in HPI or Assessment and Plan.   Objective  Well appearing patient in no apparent distress; mood and affect are within normal limits.  A focused examination was performed including face. Relevant physical exam findings are noted in the Assessment and Plan.  left cheek at beard line 3 mm firm flesh papule        Assessment & Plan  Neoplasm of skin left cheek at beard line  Skin / nail biopsy Type of biopsy: punch   Informed consent: discussed and consent obtained   Timeout: patient name, date of birth, surgical site, and procedure verified   Patient was prepped and draped in usual sterile fashion: Area prepped with alcohol. Anesthesia: the lesion was anesthetized in a standard fashion   Anesthetic:  1% lidocaine w/ epinephrine 1-100,000 buffered w/ 8.4% NaHCO3 Punch size:  3 mm Suture size:  5-0 Suture type: nylon   Suture removal (days):  7 Hemostasis achieved with: suture and pressure   Outcome: patient tolerated procedure well   Post-procedure details: wound care instructions given   Post-procedure details comment:  Ointment and small bandage applied  Specimen 1 - Surgical pathology Differential Diagnosis: R/O Cyst vs nevus  Check Margins: No  Actinic Damage - chronic, secondary to cumulative UV radiation exposure/sun exposure over time - diffuse scaly erythematous macules with underlying dyspigmentation - Recommend daily broad spectrum sunscreen SPF 30+ to sun-exposed areas, reapply every 2 hours as needed.  - Recommend staying in the shade or wearing long sleeves, sun glasses (UVA+UVB protection) and wide brim hats (4-inch  brim around the entire circumference of the hat). - Call for new or changing lesions.   Return in about 1 week (around 06/10/2022) for Suture Removal.  I, Emelia Salisbury, CMA, am acting as scribe for Brendolyn Patty, MD.  Documentation: I have reviewed the above documentation for accuracy and completeness, and I agree with the above.  Brendolyn Patty MD

## 2022-06-10 ENCOUNTER — Ambulatory Visit (INDEPENDENT_AMBULATORY_CARE_PROVIDER_SITE_OTHER): Payer: Medicare HMO | Admitting: Dermatology

## 2022-06-10 DIAGNOSIS — Z4802 Encounter for removal of sutures: Secondary | ICD-10-CM

## 2022-06-10 DIAGNOSIS — D369 Benign neoplasm, unspecified site: Secondary | ICD-10-CM

## 2022-06-10 NOTE — Progress Notes (Signed)
   Follow-Up Visit   Subjective  Willie Clark is a 72 y.o. male who presents for the following: Suture / Staple Removal.   The following portions of the chart were reviewed this encounter and updated as appropriate:       Review of Systems:  No other skin or systemic complaints except as noted in HPI or Assessment and Plan.  Objective  Well appearing patient in no apparent distress; mood and affect are within normal limits.  A focused examination was performed including face. Relevant physical exam findings are noted in the Assessment and Plan.    Assessment & Plan   Encounter for Removal of Sutures - Incision site at the left cheek at beard line is clean, dry and intact - Wound cleansed, sutures removed, wound cleansed and steri strips applied.  - Discussed pathology results showing [place]   - Patient advised to keep steri-strips dry until they fall off. - Scars remodel for a full year. - Once steri-strips fall off, patient can apply over-the-counter silicone scar cream each night to help with scar remodeling if desired. - Patient advised to call with any concerns or if they notice any new or changing lesions.  Return for as scheduled.  Graciella Belton, RMA, am acting as scribe for Brendolyn Patty, MD .  Documentation: I have reviewed the above documentation for accuracy and completeness, and I agree with the above.  Brendolyn Patty MD

## 2022-06-10 NOTE — Patient Instructions (Signed)
Due to recent changes in healthcare laws, you may see results of your pathology and/or laboratory studies on MyChart before the doctors have had a chance to review them. We understand that in some cases there may be results that are confusing or concerning to you. Please understand that not all results are received at the same time and often the doctors may need to interpret multiple results in order to provide you with the best plan of care or course of treatment. Therefore, we ask that you please give us 2 business days to thoroughly review all your results before contacting the office for clarification. Should we see a critical lab result, you will be contacted sooner.   If You Need Anything After Your Visit  If you have any questions or concerns for your doctor, please call our main line at [phone] and press option 4 to reach your doctor's medical assistant. If no one answers, please leave a voicemail as directed and we will return your call as soon as possible. Messages left after 4 pm will be answered the following business day.   You may also send us a message via MyChart. We typically respond to MyChart messages within 1-2 business days.  For prescription refills, please ask your pharmacy to contact our office. Our fax number is [phone].  If you have an urgent issue when the clinic is closed that cannot wait until the next business day, you can page your doctor at the number below.    Please note that while we do our best to be available for urgent issues outside of office hours, we are not available 24/7.   If you have an urgent issue and are unable to reach us, you may choose to seek medical care at your doctor's office, retail clinic, urgent care center, or emergency room.  If you have a medical emergency, please immediately call 911 or go to the emergency department.  Pager Numbers  - Dr. Kowalski: [phone]  - Dr. Moye: [phone]  - Dr. Stewart:  [phone]  In the event of inclement weather, please call our main line at [phone] for an update on the status of any delays or closures.  Dermatology Medication Tips: Please keep the boxes that topical medications come in in order to help keep track of the instructions about where and how to use these. Pharmacies typically print the medication instructions only on the boxes and not directly on the medication tubes.   If your medication is too expensive, please contact our office at [phone] option 4 or send us a message through MyChart.   We are unable to tell what your co-pay for medications will be in advance as this is different depending on your insurance coverage. However, we may be able to find a substitute medication at lower cost or fill out paperwork to get insurance to cover a needed medication.   If a prior authorization is required to get your medication covered by your insurance company, please allow us 1-2 business days to complete this process.  Drug prices often vary depending on where the prescription is filled and some pharmacies may offer cheaper prices.  The website www.goodrx.com contains coupons for medications through different pharmacies. The prices here do not account for what the cost may be with help from insurance (it may be cheaper with your insurance), but the website can give you the price if you did not use any insurance.  - You can print the associated coupon and take it with   your prescription to the pharmacy.  - You may also stop by our office during regular business hours and pick up a GoodRx coupon card.  - If you need your prescription sent electronically to a different pharmacy, notify our office through [facility] MyChart or by phone at [phone] option 4.     Si Usted Necesita Algo Despus de Su Visita  Tambin puede enviarnos un mensaje a travs de MyChart. Por lo general respondemos a los mensajes de MyChart en el transcurso de 1 a 2  das hbiles.  Para renovar recetas, por favor pida a su farmacia que se ponga en contacto con nuestra oficina. Nuestro nmero de fax es el [phone].  Si tiene un asunto urgente cuando la clnica est cerrada y que no puede esperar hasta el siguiente da hbil, puede llamar/localizar a su doctor(a) al nmero que aparece a continuacin.   Por favor, tenga en cuenta que aunque hacemos todo lo posible para estar disponibles para asuntos urgentes fuera del horario de oficina, no estamos disponibles las 24 horas del da, los 7 das de la semana.   Si tiene un problema urgente y no puede comunicarse con nosotros, puede optar por buscar atencin mdica  en el consultorio de su doctor(a), en una clnica privada, en un centro de atencin urgente o en una sala de emergencias.  Si tiene una emergencia mdica, por favor llame inmediatamente al 911 o vaya a la sala de emergencias.  Nmeros de bper  - Dr. Kowalski: [phone]  - Dra. Moye: [phone]  - Dra. Stewart: [phone]  En caso de inclemencias del tiempo, por favor llame a nuestra lnea principal al [phone] para una actualizacin sobre el estado de cualquier retraso o cierre.  Consejos para la medicacin en dermatologa: Por favor, guarde las cajas en las que vienen los medicamentos de uso tpico para ayudarle a seguir las instrucciones sobre dnde y cmo usarlos. Las farmacias generalmente imprimen las instrucciones del medicamento slo en las cajas y no directamente en los tubos del medicamento.   Si su medicamento es muy caro, por favor, pngase en contacto con nuestra oficina llamando al [phone] y presione la opcin 4 o envenos un mensaje a travs de MyChart.   No podemos decirle cul ser su copago por los medicamentos por adelantado ya que esto es diferente dependiendo de la cobertura de su seguro. Sin embargo, es posible que podamos encontrar un medicamento sustituto a menor costo o llenar un formulario para que el  seguro cubra el medicamento que se considera necesario.   Si se requiere una autorizacin previa para que su compaa de seguros cubra su medicamento, por favor permtanos de 1 a 2 das hbiles para completar este proceso.  Los precios de los medicamentos varan con frecuencia dependiendo del lugar de dnde se surte la receta y alguna farmacias pueden ofrecer precios ms baratos.  El sitio web www.goodrx.com tiene cupones para medicamentos de diferentes farmacias. Los precios aqu no tienen en cuenta lo que podra costar con la ayuda del seguro (puede ser ms barato con su seguro), pero el sitio web puede darle el precio si no utiliz ningn seguro.  - Puede imprimir el cupn correspondiente y llevarlo con su receta a la farmacia.  - Tambin puede pasar por nuestra oficina durante el horario de atencin regular y recoger una tarjeta de cupones de GoodRx.  - Si necesita que su receta se enve electrnicamente a una farmacia diferente, informe a nuestra oficina a travs de MyChart de    o por telfono llamando al [phone] y presione la opcin 4.  

## 2022-06-20 ENCOUNTER — Encounter: Payer: Self-pay | Admitting: Cardiovascular Disease

## 2022-06-20 ENCOUNTER — Ambulatory Visit: Payer: Medicare HMO | Attending: Cardiovascular Disease | Admitting: Cardiovascular Disease

## 2022-06-20 VITALS — BP 140/78 | HR 63 | Ht 68.5 in | Wt 203.2 lb

## 2022-06-20 DIAGNOSIS — I5022 Chronic systolic (congestive) heart failure: Secondary | ICD-10-CM | POA: Diagnosis not present

## 2022-06-20 DIAGNOSIS — I779 Disorder of arteries and arterioles, unspecified: Secondary | ICD-10-CM

## 2022-06-20 DIAGNOSIS — I10 Essential (primary) hypertension: Secondary | ICD-10-CM | POA: Diagnosis not present

## 2022-06-20 DIAGNOSIS — E785 Hyperlipidemia, unspecified: Secondary | ICD-10-CM

## 2022-06-20 DIAGNOSIS — I714 Abdominal aortic aneurysm, without rupture, unspecified: Secondary | ICD-10-CM

## 2022-06-20 DIAGNOSIS — I25118 Atherosclerotic heart disease of native coronary artery with other forms of angina pectoris: Secondary | ICD-10-CM | POA: Diagnosis not present

## 2022-06-20 NOTE — Progress Notes (Signed)
Cardiology Office Note   Date:  06/20/2022   ID:  Willie Clark, DOB [birth year], MRN [id number]  PCP:  [facility] [place]  Cardiologist:   [name], MD   Chief Complaint  Patient presents with   Other    6 month f/u c/o weight gain and sob. Meds reviewed verbally with pt.      History of Present Illness: Willie Clark is a 72 y.o. male who presents for regarding coronary artery disease and chronic systolic heart failure.  He has known history of coronary artery disease. He is status post coronary artery bypass graft surgery in 1993 and redo in 2007.   He suffers from fibromyalgia and chronic pain.  He had GI symptoms in 2015 due to documented gastric ulcer. He had neck surgery in [id number] without complications. He has known history of chronic neck pain with 2 previous cervical spine surgeries.    He was hospitalized in August of 2021with chest pain.  Echocardiogram showed an EF of 40 to 45%.  Cardiac catheterization showed severe underlying three-vessel coronary arteries with patent grafts and no significant change since 2017.  The patient antihypertensive medications were switched to losartan and carvedilol.  Rosuvastatin was increased to 40 mg daily. Losartan was subsequently switched to [facility].  However, the patient had allergic reaction with rash and itching that had to be treated in the emergency room.  Spironolactone was subsequently added.  He was seen on March of 2022 with symptoms of unstable angina.  His EKG was worrisome for inferior ST elevation myocardial infarction and thus he was transferred emergently to the Cath Lab where he underwent cardiac catheterization which showed severe native three-vessel coronary artery disease with patent left circumflex stent with mild to moderate in-stent restenosis, widely patent LIMA to LAD, patent SVG to OM 2 with 60 to 70% proximal graft stenosis and subtotally occluded SVG to RCA with long segment of proximal graft  disease and 99% stenosis at the distal anastomosis.  He underwent successful PCI to SVG to RCA with overlapping resolute Onyx drug-eluting stents. Echocardiogram during that admission showed an EF of 35 to 40%.    He was hospitalized in July, 2022 with increased shortness of breath and chest pain.   Cardiac catheterization was performed which showed significant underlying three-vessel coronary artery disease with patent left circumflex stent with moderate in-stent restenosis, patent LIMA to LAD, patent SVG to right PDA with patent stents and patent SVG to OM with sequential 75% and 60% ostial and mid graft stenoses.  Right heart catheterization showed mildly elevated filling pressures with mildly reduced cardiac output.  PCI with 2 nonoverlapping drug-eluting stent placement was done to SVG to OM.  He was switched from Brilinta to Plavix due to persistent shortness of breath.  He underwent hemicolectomy in June, 2023 for recurrent diverticulitis and abdominal pain.    He has been doing reasonably well with stable chest pain and exertional dyspnea.  He gained about 20 pounds since last year but that is likely related to improved appetite and abdominal symptoms after his hemicolectomy.  He seems to be back to his baseline weight before he had the GI issues. Will continue intermittent episodes of symptomatic hypotension at home with systolic blood pressure of 90.  Past Medical History:  Diagnosis Date   AAA (abdominal aortic aneurysm) ([place]) 02/23/2020   a.) AAA duplex 02/23/2020: measured 3.0 cm   Anxiety    Aortic atherosclerosis ([place])    Arthritis  ASD (atrial septal defect) 01/20/2006   a.) noted on intraoperative TEE; s/p closure   Barrett's esophagus    Chronic chest pain    COPD (chronic obstructive pulmonary disease) (HCC)    Coronary artery disease    a.)CABG 93; Redo 07; b.)LHC 03/15: 100 mLAD, 60 pLCx, 100 OM1, 80 pRCA, 100 mRCA - med mgmt. c.)LHC 6/17: LAD 100ost,  LCx 60p, OM1  100, 100 oRCA, VG-OM2 min irreg, VG-RPDA 30ost, 20d - med mgmt; d.)LHC 8/21: 100 oLAD, 100 oOM1-OM1, 100 o-pRCA, 100 RPL2, 100 RPL3, 100 OM2; pat grafts - med mgmt; e.)LHC 3/22: 60-70 o-pVG-OM2, 80-99 VG-RCA (DES x3); f.)R/LHC 07/22: 75 (ost) & 60 (mid) VG-OM (DES x2)   Depression    Diverticulitis    Erectile dysfunction    Fibromyalgia    Gastric ulcer    Gastritis    GERD (gastroesophageal reflux disease)    Headache    Hemorrhoids    HFrEF (heart failure with reduced ejection fraction) ([place])    a.) 09/2016 Echo: EF 55-60%, no rwma, triv AI, mild MR, mildly dil LA. -> as of April 2020: EF 40 to 45%.  Septal dyssynergy/hypokinesis due to postop state but otherwise unable to assess wall motion [name] due to poor study. b.) TTE 12/12/2019: EF 40-45%, LAE, mild AR, G1DD; c.) TTE 06/20/2020: EF 35-40%, sev inferolateral HK, mild AR, G1DD.   History of myocardial infarct at age less than 5 years    Hyperlipidemia    Hypertension    Kidney stones    Lightheadedness    Long term current use of antithrombotics/antiplatelets    a.) daily DAPT therapy (ASA + clopidogrel)   Mass on back    Myocardial infarction ([place]) 1993   Nephrolithiasis    Orthostatic hypotension    a. Improved after discontinuation of metoprolol.   OSA on CPAP    Peyronie disease    Postoperative atrial fibrillation/flutter ([place]) 01/20/2006   a.) POD2 following re-do CABG; Tx'd with IV amiodarone, which did not convert; EF 20% on POD3; DCCV (100 J x 1) POD4.   Pulmonary embolism ([place]) 01/2006   Post-op/treated   RLS (restless legs syndrome)    a.) on ropinirole   S/P CABG x 2 01/24/1992   a.) 2v CABG: LIMA-LAD, SVG-D2   S/P cervical spinal fusion    S/P RE-DO CABG x 2 01/20/2006   a.) 2v REDO CABG: reserse SVG-PDA, SVG-OM1   Skin lesions, generalized    Small bowel obstruction ([place]) 02/2009   ST elevation myocardial infarction (STEMI) of inferior wall ([place]) 06/2020   a.) LHC 06/2020 --> subtotally occluded  SVG-RCA with long segment of proximal graft disease and 99% stenosis at the distal anastomosis --> successful PCI to SVG to RCA with overlapping Resolute Onyx DES x 3   Stroke [facility]) 1993   right side weakness no blood thinners   on Aspirin   Syncope and collapse    TIA (transient ischemic attack) 08/13/2014   Urticaria     Past Surgical History:  Procedure Laterality Date   BACK SURGERY     bowel obstruction  01/2009   CARDIAC CATHETERIZATION  11/2006   patent grafts. Significant OM3 disease and occluded diagonals.   CARDIAC CATHETERIZATION  06/2010   patent grafts. significant ISR in proximal LCX but OM2 is bypassed and gives retrograde flow to OM3.    CARDIAC CATHETERIZATION  06/2013   [facility]: Patent grafts with 60% proximal in-stent restenosis in the left circumflex with FFR of 0.85  CARDIAC CATHETERIZATION Left 09/25/2015   Procedure: Left Heart Cath and Cors/Grafts Angiography;  Surgeon: Wellington Hampshire, MD;  Location: [place] CV LAB;  Service: Cardiovascular;  Laterality: Left;   CERVICAL FUSION     CHOLECYSTECTOMY     COLON SURGERY  01/2009   BOWEL RESECTION DUE TO SMALL BOWEL OBSTRUCTION   COLONOSCOPY     COLONOSCOPY WITH PROPOFOL N/A 10/28/2017   Procedure: COLONOSCOPY WITH PROPOFOL;  Surgeon: Lollie Sails, MD;  Location: [facility] ENDOSCOPY;  Service: Endoscopy;  Laterality: N/A;   COLONOSCOPY WITH PROPOFOL N/A 07/30/2021   Procedure: COLONOSCOPY WITH PROPOFOL;  Surgeon: Lesly Rubenstein, MD;  Location: [facility] ENDOSCOPY;  Service: Endoscopy;  Laterality: N/A;   CORONARY ANGIOPLASTY  2006   CORONARY ARTERY BYPASS GRAFT  1993/01/2006   redo at [facility]. LIMA to LAD, SVG to OM2 and SVG to RPDA   CORONARY STENT INTERVENTION N/A 06/20/2020   Procedure: CORONARY STENT INTERVENTION;  Surgeon: Nelva Bush, MD;  Location: [place] CV LAB;  Service: Cardiovascular;  Laterality: N/A;   CORONARY STENT INTERVENTION N/A 11/15/2020   Procedure: CORONARY STENT INTERVENTION;   Surgeon: Nelva Bush, MD;  Location: [place] CV LAB;  Service: Cardiovascular;  Laterality: N/A;   CYSTOSCOPY WITH STENT PLACEMENT Bilateral 09/24/2015   Procedure: CYSTOSCOPY WITH BILATERAL RETROGRADES, BILATERAL STENT PLACEMENT;  Surgeon: Festus Aloe, MD;  Location: [facility] ORS;  Service: Urology;  Laterality: Bilateral;   ESOPHAGOGASTRODUODENOSCOPY N/A 10/25/2014   Procedure: ESOPHAGOGASTRODUODENOSCOPY (EGD);  Surgeon: Lollie Sails, MD;  Location: [facility] ENDOSCOPY;  Service: Endoscopy;  Laterality: N/A;   ESOPHAGOGASTRODUODENOSCOPY (EGD) WITH PROPOFOL N/A 02/03/2017   Procedure: ESOPHAGOGASTRODUODENOSCOPY (EGD) WITH PROPOFOL;  Surgeon: Lollie Sails, MD;  Location: [facility] ENDOSCOPY;  Service: Endoscopy;  Laterality: N/A;   ESOPHAGOGASTRODUODENOSCOPY (EGD) WITH PROPOFOL N/A 04/18/2017   Procedure: ESOPHAGOGASTRODUODENOSCOPY (EGD) WITH PROPOFOL;  Surgeon: Lollie Sails, MD;  Location: [facility] ENDOSCOPY;  Service: Endoscopy;  Laterality: N/A;   EYE SURGERY Bilateral    Cataract Extraction with IOL   HERNIA REPAIR     INGUINAL HERNIA REPAIR Bilateral 01/23/2016   Procedure: HERNIA REPAIR INGUINAL ADULT BILATERAL;  Surgeon: Leonie Green, MD;  Location: [facility] ORS;  Service: General;  Laterality: Bilateral;   LEFT HEART CATH AND CORS/GRAFTS ANGIOGRAPHY N/A 12/13/2019   Procedure: LEFT HEART CATH AND CORS/GRAFTS ANGIOGRAPHY;  Surgeon: Wellington Hampshire, MD;  Location: [place] CV LAB;  Service: Cardiovascular;  Laterality: N/A;   LEFT HEART CATH AND CORS/GRAFTS ANGIOGRAPHY N/A 06/20/2020   Procedure: LEFT HEART CATH AND CORS/GRAFTS ANGIOGRAPHY;  Surgeon: Nelva Bush, MD;  Location: [place] CV LAB;  Service: Cardiovascular;  Laterality: N/A;   LUMBAR LAMINECTOMY/DECOMPRESSION MICRODISCECTOMY Left 10/01/2016   Procedure: Left Lumbar four-five Laminotomy for resection of synovial cyst;  Surgeon: Erline Levine, MD;  Location: [place];  Service: Neurosurgery;   Laterality: Left;  left   NECK SURGERY  06/2009   RIGHT/LEFT HEART CATH AND CORONARY/GRAFT ANGIOGRAPHY N/A 11/15/2020   Procedure: RIGHT/LEFT HEART CATH AND CORONARY/GRAFT ANGIOGRAPHY;  Surgeon: Nelva Bush, MD;  Location: [place] CV LAB;  Service: Cardiovascular;  Laterality: N/A;   SHOULDER SURGERY Bilateral 2010     Current Outpatient Medications  Medication Sig Dispense Refill   acetaminophen (TYLENOL) 500 MG tablet Take 1,000 mg by mouth every 6 (six) hours as needed for mild pain.     albuterol (VENTOLIN HFA) 108 (90 Base) MCG/ACT inhaler Inhale into the lungs.     aspirin EC 81 MG EC tablet Take 1 tablet (  81 mg total) by mouth daily. Swallow whole. 30 tablet 11   carvedilol (COREG) 3.125 MG tablet Take 3.125 mg by mouth 2 (two) times daily with a meal.     citalopram (CELEXA) 20 MG tablet Take 20 mg by mouth daily.      clobetasol cream (TEMOVATE) 0.05 % Apply to itchy areas on elbows twice a day until improved. Avoid face, groin, underarms. 30 g 1   clopidogrel (PLAVIX) 75 MG tablet TAKE 1 TABLET EVERY DAY 90 tablet 0   empagliflozin (JARDIANCE) 10 MG TABS tablet Take 1 tablet (10 mg total) by mouth daily before breakfast. 90 tablet 3   EPINEPHrine 0.3 mg/0.3 mL IJ SOAJ injection Inject 0.3 mg into the muscle as needed.      fexofenadine (ALLEGRA) 180 MG tablet Take 180 mg by mouth daily.     finasteride (PROSCAR) 5 MG tablet Take 5 mg by mouth daily.     gabapentin (NEURONTIN) 800 MG tablet Take 1 tablet by mouth 3 (three) times daily.     isosorbide mononitrate (IMDUR) 60 MG 24 hr tablet TAKE 1 TABLET EVERY DAY 90 tablet 3   Multiple Vitamins-Minerals (HAIR SKIN AND NAILS FORMULA PO) Take 1 capsule by mouth daily.     mupirocin ointment (BACTROBAN) 2 % Apply to affected area right elbow twice a day until improved. 22 g 0   nitroGLYCERIN (NITROSTAT) 0.4 MG SL tablet Place 1 tablet (0.4 mg total) under the tongue every 5 (five) minutes as needed for chest pain. 90 tablet 1    nystatin-triamcinolone ointment (MYCOLOG) Apply 1 application topically 2 (two) times daily. 30 g 0   ondansetron (ZOFRAN) 4 MG tablet Take 4 mg by mouth every 8 (eight) hours as needed for nausea or vomiting.     oxyCODONE (OXY IR/ROXICODONE) 5 MG immediate release tablet Take 2.5-5 mg by mouth every 6 (six) hours as needed.     pantoprazole (PROTONIX) 40 MG tablet Take 40 mg by mouth 2 (two) times daily.      potassium chloride SA (KLOR-CON M) 20 MEQ tablet TAKE 1 TABLET (20 MEQ TOTAL) BY MOUTH DAILY. 90 tablet 1   Probiotic Product (PROBIOTIC PO) Take by mouth daily at 8 pm.     rOPINIRole (REQUIP) 1 MG tablet Take 1 mg by mouth 2 (two) times daily.     rosuvastatin (CRESTOR) 40 MG tablet TAKE 1 TABLET EVERY DAY 90 tablet 0   spironolactone (ALDACTONE) 25 MG tablet TAKE 1 TABLET EVERY DAY 90 tablet 3   torsemide (DEMADEX) 20 MG tablet TAKE 2 TABLETS EVERY DAY 180 tablet 1   traMADol (ULTRAM) 50 MG tablet Take 1 tablet (50 mg total) by mouth every 6 (six) hours as needed for up to 15 doses for severe pain or moderate pain. 15 tablet 0   vitamin B-12 (CYANOCOBALAMIN) 1000 MCG tablet Take 1,000 mcg by mouth daily.     No current facility-administered medications for this visit.   Facility-Administered Medications Ordered in Other Visits  Medication Dose Route Frequency Provider Last Rate Last Admin   indocyanine green (IC-GREEN) injection 5 mg  5 mg Intravenous Once Sakai, Isami, DO        Allergies:   Amitriptyline, Amoxicillin-pot clavulanate, Cyclobenzaprine, Dicyclomine, Sacubitril-valsartan, Amitriptyline hcl, Bacon flavor, Hydrocodone, Fish oil, Lidocaine, Omega-3 fatty acids-vitamin e, Other, Oxycodone-acetaminophen, and Sulfa antibiotics    Social History:  The patient  reports that he quit smoking about 30 years ago. His smoking use included cigarettes. He smoked an  average of 2.5 packs per day. He has never used smokeless tobacco. He reports that he does not currently use alcohol.  He reports that he does not use drugs.   Family History:  The patient's family history includes Breast cancer in his paternal aunt; Heart attack in his father; Heart disease (age of onset: 49) in his father.    ROS:  Please see the history of present illness.   Otherwise, review of systems are positive for none.   All other systems are reviewed and negative.    PHYSICAL EXAM: VS:  BP (!) 140/78 (BP Location: Left Arm, Patient Position: Sitting, Cuff Size: Normal)   Pulse 63   Ht 5' 8.5" (1.74 m)   Wt 203 lb 4 oz (92.2 kg) Comment: Clinic Weight  SpO2 97%   BMI 30.45 kg/m  , BMI Body mass index is 30.45 kg/m. GEN: Well nourished, well developed, in no acute distress  HEENT: normal  Neck: no JVD, carotid bruits, or masses Cardiac: RRR; no murmurs, rubs, or gallops, mild bilateral leg edema Respiratory:  clear to auscultation bilaterally, normal work of breathing GI: soft, nontender, nondistended, + BS MS: no deformity or atrophy  Skin: warm and dry, no rash Neuro:  Strength and sensation are intact Psych: euthymic mood, full affect   EKG:  EKG is ordered today EKG showed normal sinus rhythm with nonspecific ST changes.   Recent Labs: 11/08/2021: ALT 29; BUN 10; Creatinine, Ser 1.10; Hemoglobin 14.8; Platelets 257; Potassium 3.2; Sodium 139    Lipid Panel    Component Value Date/Time   CHOL 165 02/07/2020 0658   TRIG 317 (H) 02/07/2020 0658   HDL 41 02/07/2020 0658   CHOLHDL 4.0 02/07/2020 0658   VLDL 63 (H) 02/07/2020 0658   LDLCALC 61 02/07/2020 0658      Wt Readings from Last 3 Encounters:  06/20/22 203 lb 4 oz (92.2 kg)  12/18/21 195 lb 9.6 oz (88.7 kg)  11/08/21 182 lb (82.6 kg)         ASSESSMENT AND PLAN:  1.  Coronary artery disease involving native coronary arteries with other forms of angina: Stable episodes of chest pain overall.  Continue lifelong dual antiplatelet therapy as tolerated.  2.  Chronic systolic heart failure: Most recent  echocardiogram in February of 2023 showed improvement in ejection fraction to 45 to 50%.  In spite of weight gain, he does not appear to be significantly volume overloaded.   Continue torsemide 40 mg daily and spironolactone 25 mg once daily.  Continue small dose carvedilol.  He is allergic to [facility] and hesitant to add an ARB given continued episodes of symptomatic hypotension.  3. Essential hypertension: Blood pressure is controlled.   4. Hyperlipidemia: Continue high-dose rosuvastatin and Vascepa.  Most recent lipid profile showed an LDL of 61 and triglyceride of 317.   5. Mild carotid disease: Previous carotid Doppler showed less than 40% stenosis bilaterally. No need to repeat.  6.  Small abdominal aortic aneurysm: This was noted on ultrasound in 2021 measuring 3 cm.  However, subsequent CT abdomen did not show significant aneurysm.  Will consider repeat abdominal ultrasound at some point.   Disposition: Follow-up in 6 months.   Signed,  [name], MD  06/20/2022 8:05 AM    [place]

## 2022-06-20 NOTE — Patient Instructions (Signed)
Medication Instructions:  No changes *If you need a refill on your cardiac medications before your next appointment, please call your pharmacy*   Lab Work: None ordered If you have labs (blood work) drawn today and your tests are completely normal, you will receive your results only by: [place] (if you have MyChart) OR A paper copy in the mail If you have any lab test that is abnormal or we need to change your treatment, we will call you to review the results.   Testing/Procedures: None ordered   Follow-Up: At [facility], you and your health needs are our priority.  As part of our continuing mission to provide you with exceptional heart care, we have created designated Provider Care Teams.  These Care Teams include your primary Cardiologist (physician) and Advanced Practice Providers (APPs -  Physician Assistants and Nurse Practitioners) who all work together to provide you with the care you need, when you need it.  We recommend signing up for the patient portal called "MyChart".  Sign up information is provided on this After Visit Summary.  MyChart is used to connect with patients for Virtual Visits (Telemedicine).  Patients are able to view lab/test results, encounter notes, upcoming appointments, etc.  Non-urgent messages can be sent to your provider as well.   To learn more about what you can do with MyChart, go to [email or web address].    Your next appointment:   6 month(s)  Provider:   You may see [name], MD or one of the following Advanced Practice Providers on your designated Care Team:   Murray Hodgkins, NP Christell Faith, PA-C Cadence Kathlen Mody, PA-C Gerrie Nordmann, NP

## 2022-07-18 ENCOUNTER — Other Ambulatory Visit: Payer: Self-pay | Admitting: Cardiovascular Disease

## 2022-07-25 ENCOUNTER — Other Ambulatory Visit: Payer: Self-pay | Admitting: Cardiovascular Disease

## 2022-07-30 ENCOUNTER — Other Ambulatory Visit: Payer: Self-pay | Admitting: Gastroenterology

## 2022-07-30 DIAGNOSIS — Z8719 Personal history of other diseases of the digestive system: Secondary | ICD-10-CM

## 2022-07-30 DIAGNOSIS — R197 Diarrhea, unspecified: Secondary | ICD-10-CM

## 2022-07-31 ENCOUNTER — Encounter: Payer: Self-pay | Admitting: Gastroenterology

## 2022-08-03 ENCOUNTER — Emergency Department
Admission: EM | Admit: 2022-08-03 | Discharge: 2022-08-03 | Disposition: A | Discharge status: Home / Self Care | Payer: Medicare HMO | Attending: Emergency Medicine | Admitting: Emergency Medicine

## 2022-08-03 ENCOUNTER — Other Ambulatory Visit: Payer: Self-pay

## 2022-08-03 ENCOUNTER — Other Ambulatory Visit: Payer: Self-pay | Admitting: Cardiovascular Disease

## 2022-08-03 DIAGNOSIS — I2510 Atherosclerotic heart disease of native coronary artery without angina pectoris: Secondary | ICD-10-CM | POA: Insufficient documentation

## 2022-08-03 DIAGNOSIS — W268XXA Contact with other sharp object(s), not elsewhere classified, initial encounter: Secondary | ICD-10-CM | POA: Diagnosis not present

## 2022-08-03 DIAGNOSIS — S81811A Laceration without foreign body, right lower leg, initial encounter: Secondary | ICD-10-CM | POA: Diagnosis not present

## 2022-08-03 DIAGNOSIS — Z23 Encounter for immunization: Secondary | ICD-10-CM | POA: Diagnosis not present

## 2022-08-03 DIAGNOSIS — Z7902 Long term (current) use of antithrombotics/antiplatelets: Secondary | ICD-10-CM | POA: Diagnosis not present

## 2022-08-03 DIAGNOSIS — Y92009 Unspecified place in unspecified non-institutional (private) residence as the place of occurrence of the external cause: Secondary | ICD-10-CM | POA: Insufficient documentation

## 2022-08-03 MED ORDER — TETANUS-DIPHTH-ACELL PERTUSSIS 5-2.5-18.5 LF-MCG/0.5 IM SUSY
0.5000 mL | PREFILLED_SYRINGE | Freq: Once | INTRAMUSCULAR | Status: AC
  Administered 2022-08-03: 0.5 mL via INTRAMUSCULAR
  Filled 2022-08-03: qty 0.5

## 2022-08-03 MED ORDER — LIDOCAINE-EPINEPHRINE (PF) 1 %-1:200000 IJ SOLN
10.0000 mL | Freq: Once | INTRAMUSCULAR | Status: AC
  Administered 2022-08-03: 10 mL
  Filled 2022-08-03: qty 10

## 2022-08-03 NOTE — ED Triage Notes (Signed)
Pt to ED with friend for laceration to RLE from 2 by 2 piece of wood. Unknown last Tdap. Wife has pic on phone, appears long and deep, wound currently wrapped with gauze/pressure dressing. Pt ambulatory. Injury occurred about 3 hour sago. Takes plavix 75mg  nightly.

## 2022-08-03 NOTE — Discharge Instructions (Signed)
Do not get the sutured area wet for 24 hours. After 24 hours, shower/bathe as usual and pat the area dry. °Change the bandage 2 times per day and apply antibiotic ointment. °Leave open to air when at no risk of getting the area dirty, but cover at night before bed. °See your PCP or go to Urgent Care in 10 days for suture removal or sooner for signs or concern of infection. ° °

## 2022-08-03 NOTE — ED Provider Notes (Signed)
[facility] Provider Note    Event Date/Time   First MD Initiated Contact with Patient 08/03/22 1641     (approximate)   History   Laceration   HPI  Willie Clark is a 72 y.o. male  with history of CAD on Plavix, GERD, PE and as listed in EMR presents to the emergency department for evaluation of laceration to right lower extremity sustained from a piece of wood Bleeding controlled with pressure.[name]    Physical Exam   Triage Vital Signs: ED Triage Vitals  Enc Vitals Group     BP 08/03/22 1632 130/81     Pulse Rate 08/03/22 1632 (!) 103     Resp 08/03/22 1632 17     Temp 08/03/22 1632 98.5 F (36.9 C)     Temp Source 08/03/22 1632 Oral     SpO2 08/03/22 1632 95 %     Weight 08/03/22 1633 202 lb (91.6 kg)     Height 08/03/22 1633 5\' 8"  (1.727 m)     Head Circumference --      Peak Flow --      Pain Score 08/03/22 1632 3     Pain Loc --      Pain Edu? --      Excl. in GC? --     Most recent vital signs: Vitals:   08/03/22 1632 08/03/22 1817  BP: 130/81   Pulse: (!) 103 87  Resp: 17   Temp: 98.5 F (36.9 C)   SpO2: 95%     General: Awake, no distress.  CV:  Good peripheral perfusion.  Resp:  Normal effort.  Abd:  No distention.  Other:  6 cm laceration to the right lower extremity.   ED Results / Procedures / Treatments   Labs (all labs ordered are listed, but only abnormal results are displayed) Labs Reviewed - No data to display   EKG  Not indicated.   RADIOLOGY  Image and radiology report reviewed and interpreted by me. Radiology report consistent with the same.  Not indicated.  PROCEDURES:  Critical Care performed: No  ..Laceration Repair  Date/Time: 08/03/2022 6:52 PM  Performed by: Chinita Pester, FNP Authorized by: Chinita Pester, FNP   Consent:    Consent obtained:  Verbal   Consent given by:  Parent   Risks discussed:  Infection and poor cosmetic result Universal protocol:    Procedure explained  and questions answered to patient or proxy's satisfaction: yes   Anesthesia:    Anesthesia method:  Local infiltration   Local anesthetic:  Lidocaine 1% WITH epi Laceration details:    Location:  Leg   Leg location:  R lower leg   Length (cm):  6 Pre-procedure details:    Preparation:  Patient was prepped and draped in usual sterile fashion Exploration:    Hemostasis achieved with:  Direct pressure   Contaminated: no   Treatment:    Area cleansed with:  Povidone-iodine and saline   Irrigation method:  Syringe and tap Skin repair:    Repair method:  Sutures   Suture size:  5-0   Suture material:  Nylon   Suture technique:  Simple interrupted   Number of sutures:  6 Approximation:    Approximation:  Close Repair type:    Repair type:  Simple Post-procedure details:    Dressing:  Adhesive bandage   Procedure completion:  Tolerated well, no immediate complications    MEDICATIONS ORDERED IN ED:  Medications  lidocaine-EPINEPHrine (PF) (XYLOCAINE-EPINEPHrine) 1 %-1:200000 (PF) injection 10 mL (10 mLs Infiltration Given 08/03/22 1727)  Tdap (BOOSTRIX) injection 0.5 mL (0.5 mLs Intramuscular Given 08/03/22 1757)     IMPRESSION / MDM / ASSESSMENT AND PLAN / ED COURSE   I have reviewed the triage note.  Differential diagnosis includes, but is not limited to, Retained FB, skin laceration  Patient's presentation is most consistent with acute, uncomplicated illness.  72 year old male presenting to the emergency department for treatment and evaluation after sustaining a laceration to the right lower extremity at home earlier today.  See HPI for further details.  Wound was cleaned and repaired as described above.  Wound care instructions were given.  He will see primary care or go to urgent care for suture removal in 10 to 12 days.     FINAL CLINICAL IMPRESSION(S) / ED DIAGNOSES   Final diagnoses:  Laceration of right lower extremity, initial encounter     Rx / DC Orders    ED Discharge Orders     None        Note:  This document was prepared using Dragon voice recognition software and may include unintentional dictation errors.   Chinita Pester, FNP 08/03/22 1854    Minna Antis, MD 08/03/22 1935

## 2022-08-06 ENCOUNTER — Ambulatory Visit
Admission: RE | Admit: 2022-08-06 | Discharge: 2022-08-06 | Disposition: A | Discharge status: Home / Self Care | Payer: Medicare HMO | Source: Ambulatory Visit | Attending: Gastroenterology | Admitting: Gastroenterology

## 2022-08-06 DIAGNOSIS — R197 Diarrhea, unspecified: Secondary | ICD-10-CM

## 2022-08-06 DIAGNOSIS — Z8719 Personal history of other diseases of the digestive system: Secondary | ICD-10-CM

## 2022-08-06 MED ORDER — IOPAMIDOL (ISOVUE-300) INJECTION 61%
100.0000 mL | Freq: Once | INTRAVENOUS | Status: AC | PRN
  Administered 2022-08-06: 100 mL via INTRAVENOUS

## 2022-08-16 ENCOUNTER — Other Ambulatory Visit: Payer: Self-pay | Admitting: Cardiovascular Disease

## 2022-09-02 ENCOUNTER — Telehealth: Payer: Self-pay | Admitting: Cardiovascular Disease

## 2022-09-02 NOTE — Telephone Encounter (Signed)
Left voicemail for patient to schedule 1 month follow up appointment

## 2022-09-02 NOTE — Telephone Encounter (Signed)
-----   Message from Iran Ouch, MD sent at 09/02/2022  3:04 PM EDT ----- I saw him in the hospital today visiting a relative and he reported increased shortness of breath and fatigue.  Please schedule him for a follow-up visit within 1 month instead of August.

## 2022-09-03 ENCOUNTER — Encounter: Payer: Self-pay | Admitting: Dermatology

## 2022-09-03 ENCOUNTER — Ambulatory Visit: Payer: Medicare HMO | Admitting: Dermatology

## 2022-09-03 VITALS — BP 119/62 | HR 62

## 2022-09-03 DIAGNOSIS — W908XXA Exposure to other nonionizing radiation, initial encounter: Secondary | ICD-10-CM | POA: Diagnosis not present

## 2022-09-03 DIAGNOSIS — L97911 Non-pressure chronic ulcer of unspecified part of right lower leg limited to breakdown of skin: Secondary | ICD-10-CM

## 2022-09-03 DIAGNOSIS — L820 Inflamed seborrheic keratosis: Secondary | ICD-10-CM

## 2022-09-03 DIAGNOSIS — L578 Other skin changes due to chronic exposure to nonionizing radiation: Secondary | ICD-10-CM

## 2022-09-03 DIAGNOSIS — L98491 Non-pressure chronic ulcer of skin of other sites limited to breakdown of skin: Secondary | ICD-10-CM

## 2022-09-03 DIAGNOSIS — X32XXXA Exposure to sunlight, initial encounter: Secondary | ICD-10-CM

## 2022-09-03 MED ORDER — MUPIROCIN 2 % EX OINT: TOPICAL_OINTMENT | CUTANEOUS | 1 refills | Status: DC

## 2022-09-03 NOTE — Progress Notes (Signed)
   Follow-Up Visit   Subjective  Willie Clark is a 72 y.o. male who presents for the following: ISK follow up. Torso, face, arms. Has spots on arms, back, legs to address today.  Spots get sore and irritated.   The following portions of the chart were reviewed this encounter and updated as appropriate: medications, allergies, medical history  Review of Systems:  No other skin or systemic complaints except as noted in HPI or Assessment and Plan.  Objective  Well appearing patient in no apparent distress; mood and affect are within normal limits.  A focused examination was performed of the following areas: Face, torso, arms, legs  Relevant exam findings are noted in the Assessment and Plan.  L Lateral Lower Leg x1, left posterior upper arm x5, left posterior shoulder x9, right paranasal x1 (6) Erythematous keratotic or waxy stuck-on papule  Right Lower Leg - Anterior Linear ulceration       Assessment & Plan     Inflamed seborrheic keratosis (6) L Lateral Lower Leg x1, left posterior upper arm x5, left posterior shoulder x9, right paranasal x1  Symptomatic, irritating, patient would like treated.  Destruction of lesion - L Lateral Lower Leg x1, left posterior upper arm x5, left posterior shoulder x9, right paranasal x1  Destruction method: cryotherapy   Informed consent: discussed and consent obtained   Lesion destroyed using liquid nitrogen: Yes   Region frozen until ice ball extended beyond lesion: Yes   Outcome: patient tolerated procedure well with no complications   Post-procedure details: wound care instructions given   Additional details:  Prior to procedure, discussed risks of blister formation, small wound, skin dyspigmentation, or rare scar following cryotherapy. Recommend Vaseline ointment to treated areas while healing.   Traumatic ulcer, limited to breakdown of skin (HCC) Right Lower Leg - Anterior  Secondary to laceration. Was seen at hospital, sutured  area 07/03/2022  Watch for signs of infection.   Start Mupirocin ointment twice daily for one week then start Stratamed bid. Stratamed sample given today. Apply twice daily.   ACTINIC DAMAGE - chronic, secondary to cumulative UV radiation exposure/sun exposure over time - diffuse scaly erythematous macules with underlying dyspigmentation - Recommend daily broad spectrum sunscreen SPF 30+ to sun-exposed areas, reapply every 2 hours as needed.  - Recommend staying in the shade or wearing long sleeves, sun glasses (UVA+UVB protection) and wide brim hats (4-inch brim around the entire circumference of the hat). - Call for new or changing lesions.   Return for ISK Follow Up in 3-4 months.  I, Lawson Radar, CMA, am acting as scribe for Willeen Niece, MD.   Documentation: I have reviewed the above documentation for accuracy and completeness, and I agree with the above.  Willeen Niece, MD

## 2022-09-03 NOTE — Patient Instructions (Addendum)
Start Mupirocin twice daily for one week then start Stradamed.  Stratamed sample given today. Apply twice daily after using Mupirocin for the first week.     Cryotherapy Aftercare  Wash gently with soap and water everyday.   Apply Vaseline Jelly or Aquaphor until healed.    Recommend daily broad spectrum sunscreen SPF 30+ to sun-exposed areas, reapply every 2 hours as needed. Call for new or changing lesions.  Staying in the shade or wearing long sleeves, sun glasses (UVA+UVB protection) and wide brim hats (4-inch brim around the entire circumference of the hat) are also recommended for sun protection.     Due to recent changes in healthcare laws, you may see results of your pathology and/or laboratory studies on MyChart before the doctors have had a chance to review them. We understand that in some cases there may be results that are confusing or concerning to you. Please understand that not all results are received at the same time and often the doctors may need to interpret multiple results in order to provide you with the best plan of care or course of treatment. Therefore, we ask that you please give [place] 2 business days to thoroughly review all your results before contacting the office for clarification. Should we see a critical lab result, you will be contacted sooner.   If You Need Anything After Your Visit  If you have any questions or concerns for your doctor, please call our main line at [phone] and press option 4 to reach your doctor's medical assistant. If no one answers, please leave a voicemail as directed and we will return your call as soon as possible. Messages left after 4 pm will be answered the following business day.   You may also send [place] a message via MyChart. We typically respond to MyChart messages within 1-2 business days.  For prescription refills, please ask your pharmacy to contact our office. Our fax number is [phone].  If you have an urgent issue  when the clinic is closed that cannot wait until the next business day, you can page your doctor at the number below.    Please note that while we do our best to be available for urgent issues outside of office hours, we are not available 24/7.   If you have an urgent issue and are unable to reach [place], you may choose to seek medical care at your doctor's office, retail clinic, urgent care center, or emergency room.  If you have a medical emergency, please immediately call 911 or go to the emergency department.  Pager Numbers  - Dr. Gwen Pounds: [phone]  - Dr. Neale Burly: [phone]  - Dr. Roseanne Reno: [phone]  In the event of inclement weather, please call our main line at [phone] for an update on the status of any delays or closures.  Dermatology Medication Tips: Please keep the boxes that topical medications come in in order to help keep track of the instructions about where and how to use these. Pharmacies typically print the medication instructions only on the boxes and not directly on the medication tubes.   If your medication is too expensive, please contact our office at [phone] option 4 or send [place] a message through MyChart.   We are unable to tell what your co-pay for medications will be in advance as this is different depending on your insurance coverage. However, we may be able to find a substitute medication at lower cost or fill out paperwork to get insurance to cover a  needed medication.   If a prior authorization is required to get your medication covered by your insurance company, please allow [place] 1-2 business days to complete this process.  Drug prices often vary depending on where the prescription is filled and some pharmacies may offer cheaper prices.  The website www.goodrx.com contains coupons for medications through different pharmacies. The prices here do not account for what the cost may be with help from insurance (it may be cheaper with your insurance),  but the website can give you the price if you did not use any insurance.  - You can print the associated coupon and take it with your prescription to the pharmacy.  - You may also stop by our office during regular business hours and pick up a GoodRx coupon card.  - If you need your prescription sent electronically to a different pharmacy, notify our office through [facility] or by phone at [phone] option 4.     Si Usted Necesita Algo Despus de Su Visita  Tambin puede enviarnos un mensaje a travs de Clinical cytogeneticist. Por lo general respondemos a los mensajes de MyChart en el transcurso de 1 a 2 das hbiles.  Para renovar recetas, por favor pida a su farmacia que se ponga en contacto con nuestra oficina. Annie Sable de fax es [place] [phone].  Si tiene un asunto urgente cuando la clnica est cerrada y que no puede esperar hasta el siguiente da hbil, puede llamar/localizar a su doctor(a) al nmero que aparece a continuacin.   Por favor, tenga en cuenta que aunque hacemos todo lo posible para estar disponibles para asuntos urgentes fuera del horario de [place], no estamos disponibles las 24 horas del da, los 7 [address]  [address] de la [place].   Si tiene un problema urgente y no puede comunicarse con nosotros, puede optar por buscar atencin mdica  en el consultorio de su doctor(a), en una clnica privada, en un centro de atencin urgente o en una sala de emergencias.  Si tiene Engineer, drilling, por favor llame inmediatamente al 911 o vaya a la sala de emergencias.  Nmeros de bper  - Dr. Gwen Pounds: [phone]  - Dra. Moye: [phone]  - Dra. Roseanne Reno: [phone]  En caso de inclemencias del [place], por favor llame a Lacy Duverney principal al [phone] para una actualizacin sobre el [place] de cualquier retraso o cierre.  Consejos para la medicacin en dermatologa: Por favor, guarde las cajas en las que vienen los medicamentos de uso tpico para ayudarle a seguir las  instrucciones sobre dnde y cmo usarlos. Las farmacias generalmente imprimen las instrucciones del medicamento slo en las cajas y no directamente en los tubos del [place].   Si su medicamento es muy caro, por favor, pngase en contacto con Rolm Gala llamando al [phone] y presione la opcin 4 o envenos un mensaje a travs de Clinical cytogeneticist.   No podemos decirle cul ser su copago por los medicamentos por adelantado ya que esto es diferente dependiendo de la cobertura de su seguro. Sin embargo, es posible que podamos encontrar un medicamento sustituto a Audiological scientist un formulario para que el seguro cubra el medicamento que se considera necesario.   Si se requiere una autorizacin previa para que su compaa de seguros [place] su medicamento, por favor permtanos de 1 a 2 das hbiles para completar [address]  Los precios de los medicamentos varan con frecuencia dependiendo del Environmental consultant de dnde se surte la receta y alguna farmacias pueden ofrecer precios ms baratos.  El sitio web www.goodrx.com tiene cupones para medicamentos de diferentes farmacias. Los precios aqu no tienen en cuenta lo que podra costar con la ayuda del seguro (puede ser ms barato con su seguro), pero el sitio web puede darle el precio si no utiliz ningn seguro.  - Puede imprimir el cupn correspondiente y llevarlo con su receta a la farmacia.  - Tambin puede pasar por nuestra oficina durante el horario de atencin regular y recoger una tarjeta de cupones de GoodRx.  - Si necesita que su receta se enve electrnicamente a una farmacia diferente, informe a nuestra oficina a travs de MyChart de [place] o por telfono llamando al [phone] y presione la opcin 4.  

## 2022-09-06 ENCOUNTER — Other Ambulatory Visit: Payer: Self-pay | Admitting: Cardiovascular Disease

## 2022-09-24 ENCOUNTER — Ambulatory Visit: Payer: Medicare HMO | Attending: Cardiovascular Disease | Admitting: Cardiovascular Disease

## 2022-09-24 ENCOUNTER — Encounter: Payer: Self-pay | Admitting: Cardiovascular Disease

## 2022-09-24 VITALS — BP 136/76 | HR 61 | Ht 69.0 in | Wt 209.4 lb

## 2022-09-24 DIAGNOSIS — I2510 Atherosclerotic heart disease of native coronary artery without angina pectoris: Secondary | ICD-10-CM | POA: Diagnosis not present

## 2022-09-24 DIAGNOSIS — I779 Disorder of arteries and arterioles, unspecified: Secondary | ICD-10-CM

## 2022-09-24 DIAGNOSIS — I5022 Chronic systolic (congestive) heart failure: Secondary | ICD-10-CM | POA: Diagnosis not present

## 2022-09-24 DIAGNOSIS — I10 Essential (primary) hypertension: Secondary | ICD-10-CM

## 2022-09-24 DIAGNOSIS — E785 Hyperlipidemia, unspecified: Secondary | ICD-10-CM

## 2022-09-24 NOTE — Patient Instructions (Signed)
Medication Instructions:  No changes *If you need a refill on your cardiac medications before your next appointment, please call your pharmacy*   Lab Work: None ordered If you have labs (blood work) drawn today and your tests are completely normal, you will receive your results only by: MyChart Message (if you have MyChart) OR A paper copy in the mail If you have any lab test that is abnormal or we need to change your treatment, we will call you to review the results.   Testing/Procedures: None ordered   Follow-Up: At [facility], you and your health needs are our priority.  As part of our continuing mission to provide you with exceptional heart care, we have created designated Provider Care Teams.  These Care Teams include your primary Cardiologist (physician) and Advanced Practice Providers (APPs -  Physician Assistants and Nurse Practitioners) who all work together to provide you with the care you need, when you need it.  We recommend signing up for the patient portal called "MyChart".  Sign up information is provided on this After Visit Summary.  MyChart is used to connect with patients for Virtual Visits (Telemedicine).  Patients are able to view lab/test results, encounter notes, upcoming appointments, etc.  Non-urgent messages can be sent to your provider as well.   To learn more about what you can do with MyChart, go to [email or web address]://[email or web address].    Your next appointment:   6 month(s)  Provider:   You may see Muhammad Arida, MD or one of the following Advanced Practice Providers on your designated Care Team:   Christopher Berge, NP Ryan Dunn, PA-C [name], PA-C Sheri Hammock, NP    

## 2022-09-24 NOTE — Progress Notes (Signed)
Cardiology Office Note   Date:  09/24/2022   ID:  Willie Clark, DOB 02-15-1951, MRN [id number]  PCP:  [facility]  Cardiologist:   Lorine Bears, MD   Chief Complaint  Patient presents with   Follow-up    Patient has been experiencing fatigue. Meds reviewed.       History of Present Illness: Willie Clark is a 72 y.o. male who presents for regarding coronary artery disease and chronic systolic heart failure.  He has known history of coronary artery disease. He is status post coronary artery bypass graft surgery in 1993 and redo in 2007.   He suffers from fibromyalgia and chronic pain.  He had GI symptoms in 2015 due to documented gastric ulcer. He had neck surgery in 2016 without complications. He has known history of chronic neck pain with 2 previous cervical spine surgeries.    He was hospitalized in August of 2021with chest pain.  Echocardiogram showed an EF of 40 to 45%.  Cardiac catheterization showed severe underlying three-vessel coronary arteries with patent grafts and no significant change since 2017.  The patient antihypertensive medications were switched to losartan and carvedilol.  Rosuvastatin was increased to 40 mg daily. Losartan was subsequently switched to [facility].  However, the patient had allergic reaction with rash and itching that had to be treated in the emergency room.  Spironolactone was subsequently added.  He was seen on March of 2022 with symptoms of unstable angina.  His EKG was worrisome for inferior ST elevation myocardial infarction and thus he was transferred emergently to the Cath Lab where he underwent cardiac catheterization which showed severe native three-vessel coronary artery disease with patent left circumflex stent with mild to moderate in-stent restenosis, widely patent LIMA to LAD, patent SVG to OM 2 with 60 to 70% proximal graft stenosis and subtotally occluded SVG to RCA with long segment of proximal graft disease  and 99% stenosis at the distal anastomosis.  He underwent successful PCI to SVG to RCA with overlapping resolute Onyx drug-eluting stents. Echocardiogram during that admission showed an EF of 35 to 40%.    He was hospitalized in July, 2022 with increased shortness of breath and chest pain.   Cardiac catheterization was performed which showed significant underlying three-vessel coronary artery disease with patent left circumflex stent with moderate in-stent restenosis, patent LIMA to LAD, patent SVG to right PDA with patent stents and patent SVG to OM with sequential 75% and 60% ostial and mid graft stenoses.  Right heart catheterization showed mildly elevated filling pressures with mildly reduced cardiac output.  PCI with 2 nonoverlapping drug-eluting stent placement was done to SVG to OM.  He underwent hemicolectomy in June, 2023 for recurrent diverticulitis and abdominal pain.    He has been stable from a cardiac standpoint.  He has chronic fatigue, exertional dyspnea and chest pain that has not changed.  In addition, he continues to have lower extremity edema.  Past Medical History:  Diagnosis Date   AAA (abdominal aortic aneurysm) (HCC) 02/23/2020   a.) AAA duplex 02/23/2020: measured 3.0 cm   Anxiety    Aortic atherosclerosis (HCC)    Arthritis    ASD (atrial septal defect) 01/20/2006   a.) noted on intraoperative TEE; s/p closure   Barrett's esophagus    Chronic chest pain    COPD (chronic obstructive pulmonary disease) (HCC)    Coronary artery disease    a.)CABG 93; Redo 07; b.)LHC 03/15: 100 mLAD, 60 pLCx,  100 OM1, 80 pRCA, 100 mRCA - med mgmt. c.)LHC 6/17: LAD 100ost,  LCx 60p, OM1 100, 100 oRCA, VG-OM2 min irreg, VG-RPDA 30ost, 20d - med mgmt; d.)LHC 8/21: 100 oLAD, 100 oOM1-OM1, 100 o-pRCA, 100 RPL2, 100 RPL3, 100 OM2; pat grafts - med mgmt; e.)LHC 3/22: 60-70 o-pVG-OM2, 80-99 VG-RCA (DES x3); f.)R/LHC 07/22: 75 (ost) & 60 (mid) VG-OM (DES x2)   Depression    Diverticulitis     Erectile dysfunction    Fibromyalgia    Gastric ulcer    Gastritis    GERD (gastroesophageal reflux disease)    Headache    Hemorrhoids    HFrEF (heart failure with reduced ejection fraction) (HCC)    a.) 09/2016 Echo: EF 55-60%, no rwma, triv AI, mild MR, mildly dil LA. -> as of April 2020: EF 40 to 45%.  Septal dyssynergy/hypokinesis due to postop state but otherwise unable to assess wall motion Gershon Mussel due to poor study. b.) TTE 12/12/2019: EF 40-45%, LAE, mild AR, G1DD; c.) TTE 06/20/2020: EF 35-40%, sev inferolateral HK, mild AR, G1DD.   History of myocardial infarct at age less than 60 years    Hyperlipidemia    Hypertension    Kidney stones    Lightheadedness    Long term current use of antithrombotics/antiplatelets    a.) daily DAPT therapy (ASA + clopidogrel)   Mass on back    Myocardial infarction (HCC) 1993   Nephrolithiasis    Orthostatic hypotension    a. Improved after discontinuation of metoprolol.   OSA on CPAP    Peyronie disease    Postoperative atrial fibrillation/flutter (HCC) 01/20/2006   a.) POD2 following re-do CABG; Tx'd with IV amiodarone, which did not convert; EF 20% on POD3; DCCV (100 J x 1) POD4.   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   a.) 2v CABG: LIMA-LAD, SVG-D2   S/P cervical spinal fusion    S/P RE-DO CABG x 2 01/20/2006   a.) 2v REDO CABG: reserse SVG-PDA, SVG-OM1   Skin lesions, generalized    Small bowel obstruction (HCC) 02/2009   ST elevation myocardial infarction (STEMI) of inferior wall (HCC) 06/2020   a.) LHC 06/2020 --> subtotally occluded SVG-RCA with long segment of proximal graft disease and 99% stenosis at the distal anastomosis --> successful PCI to SVG to RCA with overlapping Resolute Onyx DES x 3   Stroke [facility]) 1993   right side weakness no blood thinners   on Aspirin   Syncope and collapse    TIA (transient ischemic attack) 08/13/2014    Urticaria     Past Surgical History:  Procedure Laterality Date   BACK SURGERY     bowel obstruction  01/2009   CARDIAC CATHETERIZATION  11/2006   patent grafts. Significant OM3 disease and occluded diagonals.   CARDIAC CATHETERIZATION  06/2010   patent grafts. significant ISR in proximal LCX but OM2 is bypassed and gives retrograde flow to OM3.    CARDIAC CATHETERIZATION  06/2013   [facility]: Patent grafts with 60% proximal in-stent restenosis in the left circumflex with FFR of 0.85   CARDIAC CATHETERIZATION Left 09/25/2015   Procedure: Left Heart Cath and Cors/Grafts Angiography;  Surgeon: Iran Ouch, MD;  Location: [facility] INVASIVE CV LAB;  Service: Cardiovascular;  Laterality: Left;   CERVICAL FUSION     CHOLECYSTECTOMY     COLON SURGERY  01/2009   BOWEL  RESECTION DUE TO SMALL BOWEL OBSTRUCTION   COLONOSCOPY     COLONOSCOPY WITH PROPOFOL N/A 10/28/2017   Procedure: COLONOSCOPY WITH PROPOFOL;  Surgeon: Christena Deem, MD;  Location: [facility] ENDOSCOPY;  Service: Endoscopy;  Laterality: N/A;   COLONOSCOPY WITH PROPOFOL N/A 07/30/2021   Procedure: COLONOSCOPY WITH PROPOFOL;  Surgeon: Regis Bill, MD;  Location: [facility] ENDOSCOPY;  Service: Endoscopy;  Laterality: N/A;   CORONARY ANGIOPLASTY  2006   CORONARY ARTERY BYPASS GRAFT  1993/01/2006   redo at [facility] LIMA to LAD, SVG to OM2 and SVG to RPDA   CORONARY STENT INTERVENTION N/A 06/20/2020   Procedure: CORONARY STENT INTERVENTION;  Surgeon: Yvonne Kendall, MD;  Location: [facility] INVASIVE CV LAB;  Service: Cardiovascular;  Laterality: N/A;   CORONARY STENT INTERVENTION N/A 11/15/2020   Procedure: CORONARY STENT INTERVENTION;  Surgeon: Yvonne Kendall, MD;  Location: [facility] INVASIVE CV LAB;  Service: Cardiovascular;  Laterality: N/A;   CYSTOSCOPY WITH STENT PLACEMENT Bilateral 09/24/2015   Procedure: CYSTOSCOPY WITH BILATERAL RETROGRADES, BILATERAL STENT PLACEMENT;  Surgeon: Jerilee Field, MD;  Location: [facility] ORS;  Service:  Urology;  Laterality: Bilateral;   ESOPHAGOGASTRODUODENOSCOPY N/A 10/25/2014   Procedure: ESOPHAGOGASTRODUODENOSCOPY (EGD);  Surgeon: Christena Deem, MD;  Location: [facility] ENDOSCOPY;  Service: Endoscopy;  Laterality: N/A;   ESOPHAGOGASTRODUODENOSCOPY (EGD) WITH PROPOFOL N/A 02/03/2017   Procedure: ESOPHAGOGASTRODUODENOSCOPY (EGD) WITH PROPOFOL;  Surgeon: Christena Deem, MD;  Location: [facility] ENDOSCOPY;  Service: Endoscopy;  Laterality: N/A;   ESOPHAGOGASTRODUODENOSCOPY (EGD) WITH PROPOFOL N/A 04/18/2017   Procedure: ESOPHAGOGASTRODUODENOSCOPY (EGD) WITH PROPOFOL;  Surgeon: Christena Deem, MD;  Location: [facility] ENDOSCOPY;  Service: Endoscopy;  Laterality: N/A;   EYE SURGERY Bilateral    Cataract Extraction with IOL   HERNIA REPAIR     INGUINAL HERNIA REPAIR Bilateral 01/23/2016   Procedure: HERNIA REPAIR INGUINAL ADULT BILATERAL;  Surgeon: Nadeen Landau, MD;  Location: [facility] ORS;  Service: General;  Laterality: Bilateral;   LEFT HEART CATH AND CORS/GRAFTS ANGIOGRAPHY N/A 12/13/2019   Procedure: LEFT HEART CATH AND CORS/GRAFTS ANGIOGRAPHY;  Surgeon: Iran Ouch, MD;  Location: [facility] INVASIVE CV LAB;  Service: Cardiovascular;  Laterality: N/A;   LEFT HEART CATH AND CORS/GRAFTS ANGIOGRAPHY N/A 06/20/2020   Procedure: LEFT HEART CATH AND CORS/GRAFTS ANGIOGRAPHY;  Surgeon: Yvonne Kendall, MD;  Location: [facility] INVASIVE CV LAB;  Service: Cardiovascular;  Laterality: N/A;   LUMBAR LAMINECTOMY/DECOMPRESSION MICRODISCECTOMY Left 10/01/2016   Procedure: Left Lumbar four-five Laminotomy for resection of synovial cyst;  Surgeon: Maeola Harman, MD;  Location: [facility] OR;  Service: Neurosurgery;  Laterality: Left;  left   NECK SURGERY  06/2009   RIGHT/LEFT HEART CATH AND CORONARY/GRAFT ANGIOGRAPHY N/A 11/15/2020   Procedure: RIGHT/LEFT HEART CATH AND CORONARY/GRAFT ANGIOGRAPHY;  Surgeon: Yvonne Kendall, MD;  Location: [facility] INVASIVE CV LAB;  Service: Cardiovascular;  Laterality: N/A;   SHOULDER  SURGERY Bilateral 2010     Current Outpatient Medications  Medication Sig Dispense Refill   acetaminophen (TYLENOL) 500 MG tablet Take 1,000 mg by mouth every 6 (six) hours as needed for mild pain.     aspirin EC 81 MG EC tablet Take 1 tablet (81 mg total) by mouth daily. Swallow whole. 30 tablet 11   carvedilol (COREG) 3.125 MG tablet Take 3.125 mg by mouth 2 (two) times daily with a meal.     citalopram (CELEXA) 20 MG tablet Take 20 mg by mouth daily.      clopidogrel (PLAVIX) 75 MG tablet TAKE 1 TABLET EVERY DAY 90 tablet 0   empagliflozin (JARDIANCE)  10 MG TABS tablet Take 1 tablet (10 mg total) by mouth daily before breakfast. 90 tablet 3   fexofenadine (ALLEGRA) 180 MG tablet Take 180 mg by mouth daily.     finasteride (PROSCAR) 5 MG tablet Take 5 mg by mouth daily.     gabapentin (NEURONTIN) 800 MG tablet Take 1 tablet by mouth 3 (three) times daily.     isosorbide mononitrate (IMDUR) 60 MG 24 hr tablet TAKE 1 TABLET EVERY DAY 90 tablet 0   Multiple Vitamins-Minerals (HAIR SKIN AND NAILS FORMULA PO) Take 1 capsule by mouth daily.     nitroGLYCERIN (NITROSTAT) 0.4 MG SL tablet Place 1 tablet (0.4 mg total) under the tongue every 5 (five) minutes as needed for chest pain. 90 tablet 1   pantoprazole (PROTONIX) 40 MG tablet Take 40 mg by mouth 2 (two) times daily.      potassium chloride SA (KLOR-CON M) 20 MEQ tablet TAKE 1 TABLET EVERY DAY 90 tablet 2   Psyllium (DAILY FIBER PO) Take 1 tablet by mouth daily.     rOPINIRole (REQUIP) 1 MG tablet Take 1 mg by mouth 2 (two) times daily.     rosuvastatin (CRESTOR) 40 MG tablet TAKE 1 TABLET EVERY DAY 90 tablet 1   spironolactone (ALDACTONE) 25 MG tablet TAKE 1 TABLET EVERY DAY 90 tablet 3   torsemide (DEMADEX) 20 MG tablet TAKE 2 TABLETS EVERY DAY 180 tablet 0   vitamin B-12 (CYANOCOBALAMIN) 1000 MCG tablet Take 1,000 mcg by mouth daily.     albuterol (VENTOLIN HFA) 108 (90 Base) MCG/ACT inhaler Inhale into the lungs.     clobetasol cream  (TEMOVATE) 0.05 % Apply to itchy areas on elbows twice a day until improved. Avoid face, groin, underarms. 30 g 1   EPINEPHrine 0.3 mg/0.3 mL IJ SOAJ injection Inject 0.3 mg into the muscle as needed.  (Patient not taking: Reported on 09/24/2022)     mupirocin ointment (BACTROBAN) 2 % Apply twice daily to wound on leg (Patient not taking: Reported on 09/24/2022) 22 g 1   nystatin-triamcinolone ointment (MYCOLOG) Apply 1 application topically 2 (two) times daily. (Patient not taking: Reported on 09/24/2022) 30 g 0   ondansetron (ZOFRAN) 4 MG tablet Take 4 mg by mouth every 8 (eight) hours as needed for nausea or vomiting. (Patient not taking: Reported on 09/24/2022)     oxyCODONE (OXY IR/ROXICODONE) 5 MG immediate release tablet Take 2.5-5 mg by mouth every 6 (six) hours as needed.     Probiotic Product (PROBIOTIC PO) Take by mouth daily at 8 pm.     traMADol (ULTRAM) 50 MG tablet Take 1 tablet (50 mg total) by mouth every 6 (six) hours as needed for up to 15 doses for severe pain or moderate pain. (Patient not taking: Reported on 09/24/2022) 15 tablet 0   No current facility-administered medications for this visit.   Facility-Administered Medications Ordered in Other Visits  Medication Dose Route Frequency Provider Last Rate Last Admin   indocyanine green (IC-GREEN) injection 5 mg  5 mg Intravenous Once Sakai, Isami, DO        Allergies:   Amitriptyline, Amoxicillin-pot clavulanate, Cyclobenzaprine, Dicyclomine, Sacubitril-valsartan, Amitriptyline hcl, Bacon flavor, Hydrocodone, Fish oil, Lidocaine, Omega-3 fatty acids-vitamin e, Other, Oxycodone-acetaminophen, and Sulfa antibiotics    Social History:  The patient  reports that he quit smoking about 31 years ago. His smoking use included cigarettes. He smoked an average of 2.5 packs per day. He has never used smokeless tobacco. He reports that  he does not currently use alcohol. He reports that he does not use drugs.   Family History:  The patient's family  history includes Breast cancer in his paternal aunt; Heart attack in his father; Heart disease (age of onset: 49) in his father.    ROS:  Please see the history of present illness.   Otherwise, review of systems are positive for none.   All other systems are reviewed and negative.    PHYSICAL EXAM: VS:  BP 136/76 (BP Location: Right Arm, Patient Position: Sitting, Cuff Size: Normal)   Pulse 61   Ht 5\' 9"  (1.753 m)   Wt 209 lb 6.4 oz (95 kg)   SpO2 97%   BMI 30.92 kg/m  , BMI Body mass index is 30.92 kg/m. GEN: Well nourished, well developed, in no acute distress  HEENT: normal  Neck: no JVD, carotid bruits, or masses Cardiac: RRR; no murmurs, rubs, or gallops, mild bilateral leg edema Respiratory:  clear to auscultation bilaterally, normal work of breathing GI: soft, nontender, nondistended, + BS MS: no deformity or atrophy  Skin: warm and dry, no rash Neuro:  Strength and sensation are intact Psych: euthymic mood, full affect   EKG:  EKG is not ordered today    Recent Labs: 11/08/2021: ALT 29; BUN 10; Creatinine, Ser 1.10; Hemoglobin 14.8; Platelets 257; Potassium 3.2; Sodium 139    Lipid Panel    Component Value Date/Time   CHOL 165 02/07/2020 0658   TRIG 317 (H) 02/07/2020 0658   HDL 41 02/07/2020 0658   CHOLHDL 4.0 02/07/2020 0658   VLDL 63 (H) 02/07/2020 0658   LDLCALC 61 02/07/2020 0658      Wt Readings from Last 3 Encounters:  09/24/22 209 lb 6.4 oz (95 kg)  08/03/22 202 lb (91.6 kg)  06/20/22 203 lb 4 oz (92.2 kg)         ASSESSMENT AND PLAN:  1.  Coronary artery disease involving native coronary arteries with other forms of angina: Stable episodes of chest pain overall.  Continue lifelong dual antiplatelet therapy as tolerated.  2.  Chronic systolic heart failure: Most recent echocardiogram in February of 2023 showed improvement in ejection fraction to 45 to 50%.  He appears to be euvolemic on torsemide 40 mg once daily and spironolactone 25 mg  once daily.    He is allergic to [facility] and hesitant to add an ARB .  Continue carvedilol and Jardiance.  3. Essential hypertension: Blood pressure is controlled.   4. Hyperlipidemia: Continue high-dose rosuvastatin and Vascepa.  Most recent lipid profile showed an LDL of 61 and triglyceride of 317.   5. Mild carotid disease: Previous carotid Doppler showed less than 40% stenosis bilaterally. No need to repeat.  6.  Small abdominal aortic aneurysm: This was noted on ultrasound in 2021 measuring 3 cm.  However, subsequent CT abdomen did not show significant aneurysm.  He had recent CT abdomen in April which was personally reviewed by me and showed normal size aorta with no evidence of aneurysm.   Disposition: Follow-up in 6 months.   Signed,  Lorine Bears, MD  09/24/2022 4:46 PM    [facility]

## 2022-10-14 ENCOUNTER — Other Ambulatory Visit: Payer: Self-pay | Admitting: Cardiovascular Disease

## 2022-10-28 ENCOUNTER — Other Ambulatory Visit: Payer: Self-pay | Admitting: Cardiovascular Disease

## 2022-11-15 ENCOUNTER — Other Ambulatory Visit: Payer: Self-pay | Admitting: Cardiovascular Disease

## 2022-12-13 ENCOUNTER — Other Ambulatory Visit: Payer: Self-pay | Admitting: Cardiovascular Disease

## 2023-01-02 ENCOUNTER — Telehealth: Payer: Self-pay | Admitting: Cardiovascular Disease

## 2023-01-02 ENCOUNTER — Telehealth: Payer: Self-pay | Admitting: *Deleted

## 2023-01-02 NOTE — Telephone Encounter (Signed)
Pt has been scheduled for a tete visit, 01/16/23 9:00.  Consent on file / medications reconciled.

## 2023-01-02 NOTE — Telephone Encounter (Signed)
   Pre-operative Risk Assessment    Patient Name: Willie Clark  DOB: [birth year] MRN: [id number]     Request for Surgical Clearance    Procedure:   CERVICAL/LUMBAR SPINE INJECTIONS  Date of Surgery:  Clearance TBD                               Surgeon:  DR Aileen Fass Surgeon's Group or Practice Name:  [facility] Phone number:  [phone] Fax number:  [phone]  Type of Clearance Requested:   - Pharmacy:  Hold Clopidogrel (Plavix) 7 DAYS PRIOR AND RESUME THE DAY OFTER   Type of Anesthesia:  Not Indicated   Additional requests/questions:    Signed, Dalia Heading   01/02/2023, 10:22 AM

## 2023-01-02 NOTE — Telephone Encounter (Signed)
   Name: Willie Clark  DOB: [birth year]  MRN: [id number]  Primary Cardiologist: Lorine Bears, MD   Preoperative team, please contact this patient and set up a phone call appointment for further preoperative risk assessment. Please obtain consent and complete medication review. Thank you for your help.  I confirm that guidance regarding antiplatelet and oral anticoagulation therapy has been completed and, if necessary, noted below.  Per office protocol, if patient is without any new symptoms or concerns at the time of their virtual visit, he may hold Plavix for 5 days prior to procedure. Please resume Plavix as soon as possible postprocedure, at the discretion of the surgeon.     Joylene Grapes, NP 01/02/2023, 1:03 PM [facility]

## 2023-01-02 NOTE — Telephone Encounter (Signed)
Pt has been scheduled for a tete visit, 01/16/23 9:00.  Consent on file / medications reconciled.     Patient Consent for Virtual Visit        Willie Clark has provided verbal consent on 01/02/2023 for a virtual visit (video or telephone).   CONSENT FOR VIRTUAL VISIT FOR:  Willie Clark Level  By participating in this virtual visit I agree to the following:  I hereby voluntarily request, consent and authorize [facility] and its employed or contracted physicians, physician assistants, nurse practitioners or other licensed health care professionals (the Practitioner), to provide me with telemedicine health care services (the "Services") as deemed necessary by the treating Practitioner. I acknowledge and consent to receive the Services by the Practitioner via telemedicine. I understand that the telemedicine visit will involve communicating with the Practitioner through live audiovisual communication technology and the disclosure of certain medical information by electronic transmission. I acknowledge that I have been given the opportunity to request an in-person assessment or other available alternative prior to the telemedicine visit and am voluntarily participating in the telemedicine visit.  I understand that I have the right to withhold or withdraw my consent to the use of telemedicine in the course of my care at any time, without affecting my right to future care or treatment, and that the Practitioner or I may terminate the telemedicine visit at any time. I understand that I have the right to inspect all information obtained and/or recorded in the course of the telemedicine visit and may receive copies of available information for a reasonable fee.  I understand that some of the potential risks of receiving the Services via telemedicine include:  Delay or interruption in medical evaluation due to technological equipment failure or disruption; Information transmitted may not be sufficient  (e.g. poor resolution of images) to allow for appropriate medical decision making by the Practitioner; and/or  In rare instances, security protocols could fail, causing a breach of personal health information.  Furthermore, I acknowledge that it is my responsibility to provide information about my medical history, conditions and care that is complete and accurate to the best of my ability. I acknowledge that Practitioner's advice, recommendations, and/or decision may be based on factors not within their control, such as incomplete or inaccurate data provided by me or distortions of diagnostic images or specimens that may result from electronic transmissions. I understand that the practice of medicine is not an exact science and that Practitioner makes no warranties or guarantees regarding treatment outcomes. I acknowledge that a copy of this consent can be made available to me via my patient portal [facility] MyChart), or I can request a printed copy by calling the office of  [facility].    I understand that my insurance will be billed for this visit.   I have read or had this consent read to me. I understand the contents of this consent, which adequately explains the benefits and risks of the Services being provided via telemedicine.  I have been provided ample opportunity to ask questions regarding this consent and the Services and have had my questions answered to my satisfaction. I give my informed consent for the services to be provided through the use of telemedicine in my medical care

## 2023-01-06 ENCOUNTER — Ambulatory Visit: Payer: Medicare HMO | Admitting: Dermatology

## 2023-01-09 ENCOUNTER — Encounter: Payer: Self-pay | Admitting: Dermatology

## 2023-01-09 ENCOUNTER — Ambulatory Visit: Payer: Medicare HMO | Admitting: Dermatology

## 2023-01-09 ENCOUNTER — Other Ambulatory Visit: Payer: Self-pay | Admitting: Dermatology

## 2023-01-09 VITALS — BP 123/74

## 2023-01-09 DIAGNOSIS — L820 Inflamed seborrheic keratosis: Secondary | ICD-10-CM | POA: Diagnosis not present

## 2023-01-09 DIAGNOSIS — W908XXA Exposure to other nonionizing radiation, initial encounter: Secondary | ICD-10-CM

## 2023-01-09 DIAGNOSIS — L578 Other skin changes due to chronic exposure to nonionizing radiation: Secondary | ICD-10-CM | POA: Diagnosis not present

## 2023-01-09 DIAGNOSIS — L280 Lichen simplex chronicus: Secondary | ICD-10-CM

## 2023-01-09 DIAGNOSIS — D692 Other nonthrombocytopenic purpura: Secondary | ICD-10-CM | POA: Diagnosis not present

## 2023-01-09 DIAGNOSIS — L821 Other seborrheic keratosis: Secondary | ICD-10-CM

## 2023-01-09 NOTE — Patient Instructions (Addendum)
Cryotherapy Aftercare  Wash gently with soap and water everyday.   Apply Vaseline and Band-Aid daily until healed.    Due to recent changes in healthcare laws, you may see results of your pathology and/or laboratory studies on MyChart before the doctors have had a chance to review them. We understand that in some cases there may be results that are confusing or concerning to you. Please understand that not all results are received at the same time and often the doctors may need to interpret multiple results in order to provide you with the best plan of care or course of treatment. Therefore, we ask that you please give [place] 2 business days to thoroughly review all your results before contacting the office for clarification. Should we see a critical lab result, you will be contacted sooner.   If You Need Anything After Your Visit  If you have any questions or concerns for your doctor, please call our main line at [phone] and press option 4 to reach your doctor's medical assistant. If no one answers, please leave a voicemail as directed and we will return your call as soon as possible. Messages left after 4 pm will be answered the following business day.   You may also send [place] a message via MyChart. We typically respond to MyChart messages within 1-2 business days.  For prescription refills, please ask your pharmacy to contact our office. Our fax number is [phone].  If you have an urgent issue when the clinic is closed that cannot wait until the next business day, you can page your doctor at the number below.    Please note that while we do our best to be available for urgent issues outside of office hours, we are not available 24/7.   If you have an urgent issue and are unable to reach [place], you may choose to seek medical care at your doctor's office, retail clinic, urgent care center, or emergency room.  If you have a medical emergency, please immediately call 911 or go to the emergency  department.  Pager Numbers  - Dr. Gwen Pounds: [phone]  - Dr. Roseanne Reno: [phone]  - Dr. Katrinka Blazing: [phone]   In the event of inclement weather, please call our main line at [phone] for an update on the status of any delays or closures.  Dermatology Medication Tips: Please keep the boxes that topical medications come in in order to help keep track of the instructions about where and how to use these. Pharmacies typically print the medication instructions only on the boxes and not directly on the medication tubes.   If your medication is too expensive, please contact our office at [phone] option 4 or send [place] a message through MyChart.   We are unable to tell what your co-pay for medications will be in advance as this is different depending on your insurance coverage. However, we may be able to find a substitute medication at lower cost or fill out paperwork to get insurance to cover a needed medication.   If a prior authorization is required to get your medication covered by your insurance company, please allow [place] 1-2 business days to complete this process.  Drug prices often vary depending on where the prescription is filled and some pharmacies may offer cheaper prices.  The website www.goodrx.com contains coupons for medications through different pharmacies. The prices here do not account for what the cost may be with help from insurance (it may be cheaper with your insurance), but the website can  give you the price if you did not use any insurance.  - You can print the associated coupon and take it with your prescription to the pharmacy.  - You may also stop by our office during regular business hours and pick up a GoodRx coupon card.  - If you need your prescription sent electronically to a different pharmacy, notify our office through [facility] or by phone at [phone] option 4.     Si Usted Necesita Algo Despus de Su Visita  Tambin puede enviarnos  un mensaje a travs de Clinical cytogeneticist. Por lo general respondemos a los mensajes de MyChart en el transcurso de 1 a 2 das hbiles.  Para renovar recetas, por favor pida a su farmacia que se ponga en contacto con nuestra oficina. Annie Sable de fax es [place] [phone].  Si tiene un asunto urgente cuando la clnica est cerrada y que no puede esperar hasta el siguiente da hbil, puede llamar/localizar a su doctor(a) al nmero que aparece a continuacin.   Por favor, tenga en cuenta que aunque hacemos todo lo posible para estar disponibles para asuntos urgentes fuera del horario de [place], no estamos disponibles las 24 horas del da, los 7 [address]  [address] de la [place].   Si tiene un problema urgente y no puede comunicarse con nosotros, puede optar por buscar atencin mdica  en el consultorio de su doctor(a), en una clnica privada, en un centro de atencin urgente o en una sala de emergencias.  Si tiene Engineer, drilling, por favor llame inmediatamente al 911 o vaya a la sala de emergencias.  Nmeros de bper  - Dr. Gwen Pounds: [phone]  - Dra. Roseanne Reno: [phone]  - Dr. Katrinka Blazing: [phone]   En caso de inclemencias del tiempo, por favor llame a Lacy Duverney principal al [phone] para una actualizacin sobre el [place] de cualquier retraso o cierre.  Consejos para la medicacin en dermatologa: Por favor, guarde las cajas en las que vienen los medicamentos de uso tpico para ayudarle a seguir las instrucciones sobre dnde y cmo usarlos. Las farmacias generalmente imprimen las instrucciones del medicamento slo en las cajas y no directamente en los tubos del [place].   Si su medicamento es muy caro, por favor, pngase en contacto con Rolm Gala llamando al [phone] y presione la opcin 4 o envenos un mensaje a travs de Clinical cytogeneticist.   No podemos decirle cul ser su copago por los medicamentos por adelantado ya que esto es diferente dependiendo de la cobertura de su seguro. Sin  embargo, es posible que podamos encontrar un medicamento sustituto a Audiological scientist un formulario para que el seguro cubra el medicamento que se considera necesario.   Si se requiere una autorizacin previa para que su compaa de seguros [place] su medicamento, por favor permtanos de 1 a 2 das hbiles para completar [address]  Los precios de los medicamentos varan con frecuencia dependiendo del Environmental consultant de dnde se surte la receta y alguna farmacias pueden ofrecer precios ms baratos.  El sitio web www.goodrx.com tiene cupones para medicamentos de Health and safety inspector. Los precios aqu no tienen en cuenta lo que podra costar con la ayuda del seguro (puede ser ms barato con su seguro), pero el sitio web puede darle el precio si no utiliz Tourist information centre manager.  - Puede imprimir el cupn correspondiente y llevarlo con su receta a la farmacia.  - Tambin puede pasar por nuestra oficina durante el horario de atencin regular y Education officer, museum una tarjeta de cupones de GoodRx.  -  Si necesita que su receta se enve electrnicamente a Psychiatrist, informe a nuestra oficina a travs de MyChart de [place] o por telfono llamando al [phone] y presione la opcin 4.

## 2023-01-09 NOTE — Progress Notes (Signed)
   Follow-Up Visit   Subjective  Willie Clark is a 72 y.o. male who presents for the following: ISK 84m f/u, L Lateral Lower Leg x1, left posterior upper arm x5, left posterior shoulder x9, right paranasal x1, mostly cleared but others on R post shoulder/back, chest, check spot L forehead- itchy and irritated- scratches at and they bleed. The patient has spots, moles and lesions to be evaluated, some may be new or changing and the patient may have concern these could be cancer.   The following portions of the chart were reviewed this encounter and updated as appropriate: medications, allergies, medical history  Review of Systems:  No other skin or systemic complaints except as noted in HPI or Assessment and Plan.  Objective  Well appearing patient in no apparent distress; mood and affect are within normal limits.   A focused examination was performed of the following areas: Face, legs, arms  Relevant exam findings are noted in the Assessment and Plan.  L chest x 6, R chest x 1, R shoulder x 7, L forehead above brow x 1, post lower neck x 1, R cheek x 1 (17) Stuck on waxy papules with erythema and crusting    Assessment & Plan     Inflamed seborrheic keratosis (17) L chest x 6, R chest x 1, R shoulder x 7, L forehead above brow x 1, post lower neck x 1, R cheek x 1  Symptomatic, irritating, patient would like treated.  Destruction of lesion - L chest x 6, R chest x 1, R shoulder x 7, L forehead above brow x 1, post lower neck x 1, R cheek x 1 (17)  Destruction method: cryotherapy   Informed consent: discussed and consent obtained   Lesion destroyed using liquid nitrogen: Yes   Region frozen until ice ball extended beyond lesion: Yes   Outcome: patient tolerated procedure well with no complications   Post-procedure details: wound care instructions given   Additional details:  Prior to procedure, discussed risks of blister formation, small wound, skin dyspigmentation, or rare  scar following cryotherapy. Recommend Vaseline ointment to treated areas while healing.    Purpura - Chronic; persistent and recurrent.  Treatable, but not curable. - Violaceous macules and patches - Benign - Related to trauma, age, sun damage and/or use of blood thinners, chronic use of topical and/or oral steroids - Observe - Can use OTC arnica containing moisturizer such as Dermend Bruise Formula if desired - Call for worsening or other concerns  SEBORRHEIC KERATOSIS - Stuck-on, waxy, tan-brown papules and/or plaques  - Benign-appearing - Discussed benign etiology and prognosis. - Observe - Call for any changes  ACTINIC DAMAGE - chronic, secondary to cumulative UV radiation exposure/sun exposure over time - diffuse scaly erythematous macules with underlying dyspigmentation - Recommend daily broad spectrum sunscreen SPF 30+ to sun-exposed areas, reapply every 2 hours as needed.  - Recommend staying in the shade or wearing long sleeves, sun glasses (UVA+UVB protection) and wide brim hats (4-inch brim around the entire circumference of the hat). - Call for new or changing lesions.   Return in about 6 months (around 07/09/2023) for ISK f/u.  I, Ardis Rowan, RMA, am acting as scribe for Willeen Niece, MD .   Documentation: I have reviewed the above documentation for accuracy and completeness, and I agree with the above.  Willeen Niece, MD

## 2023-01-16 ENCOUNTER — Ambulatory Visit: Payer: Medicare HMO | Attending: Cardiology

## 2023-01-16 DIAGNOSIS — Z01810 Encounter for preprocedural cardiovascular examination: Secondary | ICD-10-CM

## 2023-01-16 NOTE — Progress Notes (Signed)
Virtual Visit via Telephone Note   Because of Willie Clark's co-morbid illnesses, he is at least at moderate risk for complications without adequate follow up.  This format is felt to be most appropriate for this patient at this time.  The patient did not have access to video technology/had technical difficulties with video requiring transitioning to audio format only (telephone).  All issues noted in this document were discussed and addressed.  No physical exam could be performed with this format.  Please refer to the patient's chart for his consent to telehealth for [facility].  Evaluation Performed:  Preoperative cardiovascular risk assessment _____________   Date:  01/16/2023   Patient ID:  Willie Clark, DOB [birth year], MRN [id number] Patient Location:  Home Provider location:   Office  Primary Care Provider:  [facility] Primary Cardiologist:  Lorine Bears, MD  Chief Complaint / Patient Profile   72 y.o. y/o male with a h/o CAD, HLD, HTN, carotid artery disease who is pending cervical/lumbar spine injection and presents today for telephonic preoperative cardiovascular risk assessment.  History of Present Illness    Willie Clark is a 72 y.o. male who presents via audio/video conferencing for a telehealth visit today.  Pt was last seen in cardiology clinic on 09/24/2022 by Dr. Kirke Corin.  At that time Willie Clark was doing well .  The patient is now pending procedure as outlined above. Since his last visit, he remains stable from a cardiac standpoint.  Today he denies lower extremity edema, fatigue, palpitations, melena, hematuria, hemoptysis, diaphoresis, weakness, presyncope, syncope, orthopnea, and PND.   Past Medical History    Past Medical History:  Diagnosis Date   AAA (abdominal aortic aneurysm) (HCC) 02/23/2020   a.) AAA duplex 02/23/2020: measured 3.0 cm   Anxiety    Aortic atherosclerosis (HCC)    Arthritis    ASD (atrial  septal defect) 01/20/2006   a.) noted on intraoperative TEE; s/p closure   Barrett's esophagus    Chronic chest pain    COPD (chronic obstructive pulmonary disease) (HCC)    Coronary artery disease    a.)CABG 93; Redo 07; b.)LHC 03/15: 100 mLAD, 60 pLCx, 100 OM1, 80 pRCA, 100 mRCA - med mgmt. c.)LHC 6/17: LAD 100ost,  LCx 60p, OM1 100, 100 oRCA, VG-OM2 min irreg, VG-RPDA 30ost, 20d - med mgmt; d.)LHC 8/21: 100 oLAD, 100 oOM1-OM1, 100 o-pRCA, 100 RPL2, 100 RPL3, 100 OM2; pat grafts - med mgmt; e.)LHC 3/22: 60-70 o-pVG-OM2, 80-99 VG-RCA (DES x3); f.)R/LHC 07/22: 75 (ost) & 60 (mid) VG-OM (DES x2)   Depression    Diverticulitis    Erectile dysfunction    Fibromyalgia    Gastric ulcer    Gastritis    GERD (gastroesophageal reflux disease)    Headache    Hemorrhoids    HFrEF (heart failure with reduced ejection fraction) (HCC)    a.) 09/2016 Echo: EF 55-60%, no rwma, triv AI, mild MR, mildly dil LA. -> as of April 2020: EF 40 to 45%.  Septal dyssynergy/hypokinesis due to postop state but otherwise unable to assess wall motion Willie Clark due to poor study. b.) TTE 12/12/2019: EF 40-45%, LAE, mild AR, G1DD; c.) TTE 06/20/2020: EF 35-40%, sev inferolateral HK, mild AR, G1DD.   History of myocardial infarct at age less than 60 years    Hyperlipidemia    Hypertension    Kidney stones    Lightheadedness    Long term current use of antithrombotics/antiplatelets  a.) daily DAPT therapy (ASA + clopidogrel)   Mass on back    Myocardial infarction (HCC) 1993   Nephrolithiasis    Orthostatic hypotension    a. Improved after discontinuation of metoprolol.   OSA on CPAP    Peyronie disease    Postoperative atrial fibrillation/flutter (HCC) 01/20/2006   a.) POD2 following re-do CABG; Tx'd with IV amiodarone, which did not convert; EF 20% on POD3; DCCV (100 J x 1) POD4.   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    a.) 2v CABG: LIMA-LAD, SVG-D2   S/P cervical spinal fusion    S/P RE-DO CABG x 2 01/20/2006   a.) 2v REDO CABG: reserse SVG-PDA, SVG-OM1   Skin lesions, generalized    Small bowel obstruction (HCC) 02/2009   ST elevation myocardial infarction (STEMI) of inferior wall (HCC) 06/2020   a.) LHC 06/2020 --> subtotally occluded SVG-RCA with long segment of proximal graft disease and 99% stenosis at the distal anastomosis --> successful PCI to SVG to RCA with overlapping Resolute Onyx DES x 3   Stroke [facility]) 1993   right side weakness no blood thinners   on Aspirin   Syncope and collapse    TIA (transient ischemic attack) 08/13/2014   Urticaria    Past Surgical History:  Procedure Laterality Date   BACK SURGERY     bowel obstruction  01/2009   CARDIAC CATHETERIZATION  11/2006   patent grafts. Significant OM3 disease and occluded diagonals.   CARDIAC CATHETERIZATION  06/2010   patent grafts. significant ISR in proximal LCX but OM2 is bypassed and gives retrograde flow to OM3.    CARDIAC CATHETERIZATION  06/2013   [facility]: Patent grafts with 60% proximal in-stent restenosis in the left circumflex with FFR of 0.85   CARDIAC CATHETERIZATION Left 09/25/2015   Procedure: Left Heart Cath and Cors/Grafts Angiography;  Surgeon: Iran Ouch, MD;  Location: [facility] INVASIVE CV LAB;  Service: Cardiovascular;  Laterality: Left;   CERVICAL FUSION     CHOLECYSTECTOMY     COLON SURGERY  01/2009   BOWEL RESECTION DUE TO SMALL BOWEL OBSTRUCTION   COLONOSCOPY     COLONOSCOPY WITH PROPOFOL N/A 10/28/2017   Procedure: COLONOSCOPY WITH PROPOFOL;  Surgeon: Christena Deem, MD;  Location: [facility] ENDOSCOPY;  Service: Endoscopy;  Laterality: N/A;   COLONOSCOPY WITH PROPOFOL N/A 07/30/2021   Procedure: COLONOSCOPY WITH PROPOFOL;  Surgeon: Regis Bill, MD;  Location: [facility] ENDOSCOPY;  Service: Endoscopy;  Laterality: N/A;   CORONARY ANGIOPLASTY  2006   CORONARY ARTERY BYPASS GRAFT  1993/01/2006   redo at  [facility]. LIMA to LAD, SVG to OM2 and SVG to RPDA   CORONARY STENT INTERVENTION N/A 06/20/2020   Procedure: CORONARY STENT INTERVENTION;  Surgeon: Yvonne Kendall, MD;  Location: [facility] INVASIVE CV LAB;  Service: Cardiovascular;  Laterality: N/A;   CORONARY STENT INTERVENTION N/A 11/15/2020   Procedure: CORONARY STENT INTERVENTION;  Surgeon: Yvonne Kendall, MD;  Location: [facility] INVASIVE CV LAB;  Service: Cardiovascular;  Laterality: N/A;   CYSTOSCOPY WITH STENT PLACEMENT Bilateral 09/24/2015   Procedure: CYSTOSCOPY WITH BILATERAL RETROGRADES, BILATERAL STENT PLACEMENT;  Surgeon: Jerilee Field, MD;  Location: [facility] ORS;  Service: Urology;  Laterality: Bilateral;   ESOPHAGOGASTRODUODENOSCOPY N/A 10/25/2014   Procedure: ESOPHAGOGASTRODUODENOSCOPY (EGD);  Surgeon: Christena Deem, MD;  Location: [facility] ENDOSCOPY;  Service: Endoscopy;  Laterality: N/A;   ESOPHAGOGASTRODUODENOSCOPY (EGD) WITH PROPOFOL N/A  02/03/2017   Procedure: ESOPHAGOGASTRODUODENOSCOPY (EGD) WITH PROPOFOL;  Surgeon: Christena Deem, MD;  Location: [facility] ENDOSCOPY;  Service: Endoscopy;  Laterality: N/A;   ESOPHAGOGASTRODUODENOSCOPY (EGD) WITH PROPOFOL N/A 04/18/2017   Procedure: ESOPHAGOGASTRODUODENOSCOPY (EGD) WITH PROPOFOL;  Surgeon: Christena Deem, MD;  Location: [facility] ENDOSCOPY;  Service: Endoscopy;  Laterality: N/A;   EYE SURGERY Bilateral    Cataract Extraction with IOL   HERNIA REPAIR     INGUINAL HERNIA REPAIR Bilateral 01/23/2016   Procedure: HERNIA REPAIR INGUINAL ADULT BILATERAL;  Surgeon: Nadeen Landau, MD;  Location: [facility] ORS;  Service: General;  Laterality: Bilateral;   LEFT HEART CATH AND CORS/GRAFTS ANGIOGRAPHY N/A 12/13/2019   Procedure: LEFT HEART CATH AND CORS/GRAFTS ANGIOGRAPHY;  Surgeon: Iran Ouch, MD;  Location: [facility] INVASIVE CV LAB;  Service: Cardiovascular;  Laterality: N/A;   LEFT HEART CATH AND CORS/GRAFTS ANGIOGRAPHY N/A 06/20/2020   Procedure: LEFT HEART CATH AND CORS/GRAFTS ANGIOGRAPHY;   Surgeon: Yvonne Kendall, MD;  Location: [facility] INVASIVE CV LAB;  Service: Cardiovascular;  Laterality: N/A;   LUMBAR LAMINECTOMY/DECOMPRESSION MICRODISCECTOMY Left 10/01/2016   Procedure: Left Lumbar four-five Laminotomy for resection of synovial cyst;  Surgeon: Maeola Harman, MD;  Location: [facility] OR;  Service: Neurosurgery;  Laterality: Left;  left   NECK SURGERY  06/2009   RIGHT/LEFT HEART CATH AND CORONARY/GRAFT ANGIOGRAPHY N/A 11/15/2020   Procedure: RIGHT/LEFT HEART CATH AND CORONARY/GRAFT ANGIOGRAPHY;  Surgeon: Yvonne Kendall, MD;  Location: [facility] INVASIVE CV LAB;  Service: Cardiovascular;  Laterality: N/A;   SHOULDER SURGERY Bilateral 2010    Allergies  Allergies  Allergen Reactions   Amitriptyline Other (See Comments)    Pt unsure of what happens Other reaction(s): Other (See Comments) Pt unsure of what happens Pt unsure of what happens   Amoxicillin-Pot Clavulanate Itching   Cyclobenzaprine Hives and Other (See Comments)    Pt unsure what hapens   Dicyclomine Shortness Of Breath and Swelling   Sacubitril-Valsartan Anaphylaxis and Itching   Amitriptyline Hcl    Tomasa Blase Flavor Diarrhea   Hydrocodone    Fish Oil Itching   Lidocaine Nausea And Vomiting, Other (See Comments) and Nausea Only    Nasal Spray (only time had reaction) Other reaction(s): Dizziness Nasal Spray (only time had reaction)   Omega-3 Fatty Acids-Vitamin E Itching   Other Diarrhea and Itching    Reactive agents: Onions, bacon, steak   Oxycodone-Acetaminophen Other (See Comments)    Lowers BP Other reaction(s): Other (See Comments) Lowers BP Lowers BP   Sulfa Antibiotics Itching    Home Medications    Prior to Admission medications   Medication Sig Start Date End Date Taking? Authorizing Provider  acetaminophen (TYLENOL) 500 MG tablet Take 1,000 mg by mouth every 6 (six) hours as needed for mild pain.    [provider]  albuterol (VENTOLIN HFA) 108 (90 Base) MCG/ACT inhaler Inhale into the  lungs. 11/01/21   [provider]  aspirin EC 81 MG EC tablet Take 1 tablet (81 mg total) by mouth daily. Swallow whole. 06/23/20   Lynn Ito, MD  carvedilol (COREG) 3.125 MG tablet Take 3.125 mg by mouth 2 (two) times daily with a meal.    [provider]  citalopram (CELEXA) 20 MG tablet Take 20 mg by mouth daily.     [provider]  clobetasol cream (TEMOVATE) 0.05 % APPLY TO ITCHY AREAS ON ELBOWS TWICE A DAY UNTIL IMPROVED. AVOID FACE, GROIN, UNDERARMS. 01/09/23   Willeen Niece, MD  clopidogrel (PLAVIX) 75 MG tablet TAKE 1  TABLET EVERY DAY 10/14/22   Iran Ouch, MD  empagliflozin (JARDIANCE) 10 MG TABS tablet Take 1 tablet (10 mg total) by mouth daily before breakfast. Patient not taking: Reported on 01/02/2023 04/12/21   Iran Ouch, MD  EPINEPHrine 0.3 mg/0.3 mL IJ SOAJ injection Inject 0.3 mg into the muscle as needed. 12/27/19   [provider]  fexofenadine (ALLEGRA) 180 MG tablet Take 180 mg by mouth daily.    [provider]  finasteride (PROSCAR) 5 MG tablet Take 5 mg by mouth daily.    [provider]  gabapentin (NEURONTIN) 800 MG tablet Take 1 tablet by mouth 3 (three) times daily. 12/22/19   [provider]  isosorbide mononitrate (IMDUR) 60 MG 24 hr tablet TAKE 1 TABLET EVERY DAY 11/15/22   Iran Ouch, MD  Multiple Vitamins-Minerals (HAIR SKIN AND NAILS FORMULA PO) Take 1 capsule by mouth daily.    [provider]  mupirocin ointment (BACTROBAN) 2 % Apply twice daily to wound on leg 09/03/22   Willeen Niece, MD  nitroGLYCERIN (NITROSTAT) 0.4 MG SL tablet Place 1 tablet (0.4 mg total) under the tongue every 5 (five) minutes as needed for chest pain. 06/22/20   Lynn Ito, MD  nystatin-triamcinolone ointment (MYCOLOG) Apply 1 application topically 2 (two) times daily. 03/14/21   Sondra Come, MD  ondansetron (ZOFRAN) 4 MG tablet Take 4 mg by mouth every 8 (eight) hours as needed for nausea or  vomiting.    [provider]  oxyCODONE (OXY IR/ROXICODONE) 5 MG immediate release tablet Take 2.5-5 mg by mouth every 6 (six) hours as needed. 10/18/21   [provider]  pantoprazole (PROTONIX) 40 MG tablet Take 40 mg by mouth 2 (two) times daily.     [provider]  potassium chloride SA (KLOR-CON M) 20 MEQ tablet TAKE 1 TABLET EVERY DAY 07/18/22   Iran Ouch, MD  Probiotic Product (PROBIOTIC PO) Take by mouth daily at 8 pm.    [provider]  Psyllium (DAILY FIBER PO) Take 1 tablet by mouth daily.    [provider]  rOPINIRole (REQUIP) 1 MG tablet Take 1 mg by mouth 2 (two) times daily. 05/18/20   [provider]  rosuvastatin (CRESTOR) 40 MG tablet TAKE 1 TABLET EVERY DAY 12/13/22   Iran Ouch, MD  spironolactone (ALDACTONE) 25 MG tablet TAKE 1 TABLET EVERY DAY 06/04/22   Iran Ouch, MD  torsemide (DEMADEX) 20 MG tablet TAKE 2 TABLETS EVERY DAY 10/28/22   Iran Ouch, MD  traMADol (ULTRAM) 50 MG tablet Take 1 tablet (50 mg total) by mouth every 6 (six) hours as needed for up to 15 doses for severe pain or moderate pain. 10/17/21   Sung Amabile, DO  vitamin B-12 (CYANOCOBALAMIN) 1000 MCG tablet Take 1,000 mcg by mouth daily.    [provider]    Physical Exam    Vital Signs:  Willie Clark does not have vital signs available for review today.  Given telephonic nature of communication, physical exam is limited. AAOx3. NAD. Normal affect.  Speech and respirations are unlabored.  Accessory Clinical Findings    None  Assessment & Plan    1.  Preoperative Cardiovascular Risk Assessment:  CERVICAL/LUMBAR SPINE INJECTIONS,   DR Aileen Fass, Fax number:  [phone]       Primary Cardiologist: Lorine Bears, MD  Chart reviewed as part of pre-operative protocol coverage. Given past medical history and time since last visit,  based on ACC/AHA guidelines, Willie Clark would be at acceptable risk for  the planned procedure without further cardiovascular testing.   Patient was advised that if he develops new symptoms prior to surgery to contact our office to arrange a follow-up appointment.  He verbalized understanding.  I will route this recommendation to the requesting party via Epic fax function and remove from pre-op pool.  His Plavix may be held for 5 days prior to procedure. Please resume Plavix as soon as possible postprocedure, at the discretion of the surgeon.      Time:   Today, I have spent 5 minutes with the patient with telehealth technology discussing medical history, symptoms, and management plan.  Prior to patient's phone evaluation I spent greater than 10 minutes reviewing their past medical history and cardiac medications.    Ronney Asters, NP  01/16/2023, 7:21 AM

## 2023-02-24 ENCOUNTER — Other Ambulatory Visit: Payer: Self-pay | Admitting: Cardiovascular Disease

## 2023-03-09 ENCOUNTER — Other Ambulatory Visit: Payer: Self-pay | Admitting: Cardiovascular Disease

## 2023-03-18 ENCOUNTER — Encounter: Payer: Self-pay | Admitting: *Deleted

## 2023-03-23 ENCOUNTER — Other Ambulatory Visit: Payer: Self-pay | Admitting: Cardiovascular Disease

## 2023-03-23 DIAGNOSIS — I5022 Chronic systolic (congestive) heart failure: Secondary | ICD-10-CM

## 2023-03-23 DIAGNOSIS — I10 Essential (primary) hypertension: Secondary | ICD-10-CM

## 2023-04-01 ENCOUNTER — Encounter: Payer: Self-pay | Admitting: Cardiovascular Disease

## 2023-04-01 ENCOUNTER — Ambulatory Visit: Payer: Medicare HMO | Attending: Cardiovascular Disease | Admitting: Cardiovascular Disease

## 2023-04-01 VITALS — BP 128/60 | HR 63 | Ht 68.5 in | Wt 195.5 lb

## 2023-04-01 DIAGNOSIS — E785 Hyperlipidemia, unspecified: Secondary | ICD-10-CM

## 2023-04-01 DIAGNOSIS — I25118 Atherosclerotic heart disease of native coronary artery with other forms of angina pectoris: Secondary | ICD-10-CM | POA: Diagnosis not present

## 2023-04-01 DIAGNOSIS — I10 Essential (primary) hypertension: Secondary | ICD-10-CM

## 2023-04-01 DIAGNOSIS — I5022 Chronic systolic (congestive) heart failure: Secondary | ICD-10-CM

## 2023-04-01 DIAGNOSIS — I779 Disorder of arteries and arterioles, unspecified: Secondary | ICD-10-CM

## 2023-04-01 NOTE — Progress Notes (Signed)
Cardiology Office Note   Date:  04/01/2023   ID:  Clark, Willie April 02, 1951, MRN [id number]  PCP:  [facility]  Cardiologist:   Lorine Bears, MD   Chief Complaint  Patient presents with   Follow-up    6 month f/u c/o coughing green/yellow phelgm with blood at times and congestion. Meds reviewed verbally with pt.      History of Present Illness: Willie Clark is a 72 y.o. male who presents for regarding coronary artery disease and chronic systolic heart failure.  He has known history of coronary artery disease. He is status post coronary artery bypass graft surgery in 1993 and redo in 2007.   He suffers from fibromyalgia and chronic pain.  He had GI symptoms in 2015 due to documented gastric ulcer. He had neck surgery in 2016 without complications. He has known history of chronic neck pain with 2 previous cervical spine surgeries.    He was hospitalized in August of 2021 with chest pain.  Echocardiogram showed an EF of 40 to 45%.  Cardiac catheterization showed severe underlying three-vessel coronary arteries with patent grafts and no significant change since 2017.  The patient antihypertensive medications were switched to losartan and carvedilol.  Rosuvastatin was increased to 40 mg daily. Losartan was subsequently switched to [facility].  However, the patient had allergic reaction with rash and itching that had to be treated in the emergency room.  Spironolactone was subsequently added.  He was seen on March of 2022 with symptoms of unstable angina.  His EKG was worrisome for inferior ST elevation myocardial infarction and thus he was transferred emergently to the Cath Lab where he underwent cardiac catheterization which showed severe native three-vessel coronary artery disease with patent left circumflex stent with mild to moderate in-stent restenosis, widely patent LIMA to LAD, patent SVG to OM 2 with 60 to 70% proximal graft stenosis and subtotally  occluded SVG to RCA with long segment of proximal graft disease and 99% stenosis at the distal anastomosis.  He underwent successful PCI to SVG to RCA with overlapping resolute Onyx drug-eluting stents. Echocardiogram during that admission showed an EF of 35 to 40%.    He was hospitalized in July, 2022 with increased shortness of breath and chest pain.   Cardiac catheterization was performed which showed significant underlying three-vessel coronary artery disease with patent left circumflex stent with moderate in-stent restenosis, patent LIMA to LAD, patent SVG to right PDA with patent stents and patent SVG to OM with sequential 75% and 60% ostial and mid graft stenoses.  Right heart catheterization showed mildly elevated filling pressures with mildly reduced cardiac output.  PCI with 2 nonoverlapping drug-eluting stent placement was done to SVG to OM.  He underwent hemicolectomy in June, 2023 for recurrent diverticulitis and abdominal pain.    He has been doing reasonably well with chronic chest pain that has not changed.  He does complain of increased shortness of breath and fatigue.  It appears that his losartan was resumed and he has been having issues with intermittent symptomatic hypotension.  No lower extremity edema.  Past Medical History:  Diagnosis Date   AAA (abdominal aortic aneurysm) (HCC) 02/23/2020   a.) AAA duplex 02/23/2020: measured 3.0 cm   Acute coronary syndrome (HCC)    Anxiety    Aortic atherosclerosis (HCC)    Arthritis    ASD (atrial septal defect) 01/20/2006   a.) noted on intraoperative TEE; s/p closure   Barrett esophagus  Barrett's esophagus    Bilateral leg edema    Bilateral occipital neuralgia    Bowel obstruction (HCC)    Carpal tunnel syndrome    CHF (congestive heart failure) (HCC)    Chronic chest pain    COPD (chronic obstructive pulmonary disease) (HCC)    Coronary artery disease    a.)CABG 93; Redo 07; b.)LHC 03/15: 100 mLAD, 60 pLCx, 100 OM1, 80  pRCA, 100 mRCA - med mgmt. c.)LHC 6/17: LAD 100ost,  LCx 60p, OM1 100, 100 oRCA, VG-OM2 min irreg, VG-RPDA 30ost, 20d - med mgmt; d.)LHC 8/21: 100 oLAD, 100 oOM1-OM1, 100 o-pRCA, 100 RPL2, 100 RPL3, 100 OM2; pat grafts - med mgmt; e.)LHC 3/22: 60-70 o-pVG-OM2, 80-99 VG-RCA (DES x3); f.)R/LHC 07/22: 75 (ost) & 60 (mid) VG-OM (DES x2)   Depression    Diverticulitis    Dyshidrosis    Dysphagia    Enlarged prostate without lower urinary tract symptoms (luts)    Erectile dysfunction    Fibromyalgia    Gastric ulcer    Gastritis    GERD (gastroesophageal reflux disease)    Headache    Hemorrhoids    HFrEF (heart failure with reduced ejection fraction) (HCC)    a.) 09/2016 Echo: EF 55-60%, no rwma, triv AI, mild MR, mildly dil LA. -> as of April 2020: EF 40 to 45%.  Septal dyssynergy/hypokinesis due to postop state but otherwise unable to assess wall motion Gershon Mussel due to poor study. b.) TTE 12/12/2019: EF 40-45%, LAE, mild AR, G1DD; c.) TTE 06/20/2020: EF 35-40%, sev inferolateral HK, mild AR, G1DD.   History of BPH    History of kidney stones    History of myocardial infarct at age less than 60 years    Hyperlipidemia    Hypertension    Hyponatremia    Idiopathic urticaria    Intermittent claudication (HCC)    Kidney stones    Left ventricular failure (HCC)    Lightheadedness    Liver disease    Long term current use of antithrombotics/antiplatelets    a.) daily DAPT therapy (ASA + clopidogrel)   Mass on back    Migraine without aura    Myocardial infarction (HCC) 1993   Nail dystrophy    Nephrolithiasis    Obstructive uropathy    Orthostatic hypotension    a. Improved after discontinuation of metoprolol.   OSA on CPAP    Peripheral neuropathy    Peripheral neuropathy    Peyronie disease    Postoperative atrial fibrillation/flutter (HCC) 01/20/2006   a.) POD2 following re-do CABG; Tx'd with IV amiodarone, which did not convert; EF 20% on POD3; DCCV (100 J x 1) POD4.   Pulmonary  embolism (HCC) 01/2006   Post-op/treated   RLS (restless legs syndrome)    a.) on ropinirole   S/P CABG x 2 01/24/1992   a.) 2v CABG: LIMA-LAD, SVG-D2   S/P cervical spinal fusion    S/P RE-DO CABG x 2 01/20/2006   a.) 2v REDO CABG: reserse SVG-PDA, SVG-OM1   Sinus bradycardia    Skin lesions, generalized    Small bowel obstruction (HCC) 02/2009   ST elevation myocardial infarction (STEMI) of inferior wall (HCC) 06/2020   a.) LHC 06/2020 --> subtotally occluded SVG-RCA with long segment of proximal graft disease and 99% stenosis at the distal anastomosis --> successful PCI to SVG to RCA with overlapping Resolute Onyx DES x 3   Stroke [facility]) 1993   right side weakness no blood thinners   on Aspirin  Syncope and collapse    Synovial cyst of lumbar facet joint    TIA (transient ischemic attack) 08/13/2014   Urticaria     Past Surgical History:  Procedure Laterality Date   BACK SURGERY     bowel obstruction  01/2009   CARDIAC CATHETERIZATION  11/2006   patent grafts. Significant OM3 disease and occluded diagonals.   CARDIAC CATHETERIZATION  06/2010   patent grafts. significant ISR in proximal LCX but OM2 is bypassed and gives retrograde flow to OM3.    CARDIAC CATHETERIZATION  06/2013   [facility]: Patent grafts with 60% proximal in-stent restenosis in the left circumflex with FFR of 0.85   CARDIAC CATHETERIZATION Left 09/25/2015   Procedure: Left Heart Cath and Cors/Grafts Angiography;  Surgeon: Iran Ouch, MD;  Location: [facility] INVASIVE CV LAB;  Service: Cardiovascular;  Laterality: Left;   CATARACT EXTRACTION     CERVICAL FUSION     CHOLECYSTECTOMY     COLON SURGERY  01/2009   BOWEL RESECTION DUE TO SMALL BOWEL OBSTRUCTION   COLONOSCOPY     COLONOSCOPY WITH PROPOFOL N/A 10/28/2017   Procedure: COLONOSCOPY WITH PROPOFOL;  Surgeon: Christena Deem, MD;  Location: [facility] ENDOSCOPY;  Service: Endoscopy;  Laterality: N/A;   COLONOSCOPY WITH PROPOFOL N/A 07/30/2021   Procedure:  COLONOSCOPY WITH PROPOFOL;  Surgeon: Regis Bill, MD;  Location: [facility] ENDOSCOPY;  Service: Endoscopy;  Laterality: N/A;   CORONARY ANGIOPLASTY  2006   CORONARY ARTERY BYPASS GRAFT  1993/01/2006   redo at [facility]. LIMA to LAD, SVG to OM2 and SVG to RPDA   CORONARY STENT INTERVENTION N/A 06/20/2020   Procedure: CORONARY STENT INTERVENTION;  Surgeon: Yvonne Kendall, MD;  Location: [facility] INVASIVE CV LAB;  Service: Cardiovascular;  Laterality: N/A;   CORONARY STENT INTERVENTION N/A 11/15/2020   Procedure: CORONARY STENT INTERVENTION;  Surgeon: Yvonne Kendall, MD;  Location: [facility] INVASIVE CV LAB;  Service: Cardiovascular;  Laterality: N/A;   CYSTOSCOPY WITH STENT PLACEMENT Bilateral 09/24/2015   Procedure: CYSTOSCOPY WITH BILATERAL RETROGRADES, BILATERAL STENT PLACEMENT;  Surgeon: Jerilee Field, MD;  Location: [facility] ORS;  Service: Urology;  Laterality: Bilateral;   ESOPHAGOGASTRODUODENOSCOPY N/A 10/25/2014   Procedure: ESOPHAGOGASTRODUODENOSCOPY (EGD);  Surgeon: Christena Deem, MD;  Location: [facility] ENDOSCOPY;  Service: Endoscopy;  Laterality: N/A;   ESOPHAGOGASTRODUODENOSCOPY (EGD) WITH PROPOFOL N/A 02/03/2017   Procedure: ESOPHAGOGASTRODUODENOSCOPY (EGD) WITH PROPOFOL;  Surgeon: Christena Deem, MD;  Location: [facility] ENDOSCOPY;  Service: Endoscopy;  Laterality: N/A;   ESOPHAGOGASTRODUODENOSCOPY (EGD) WITH PROPOFOL N/A 04/18/2017   Procedure: ESOPHAGOGASTRODUODENOSCOPY (EGD) WITH PROPOFOL;  Surgeon: Christena Deem, MD;  Location: [facility] ENDOSCOPY;  Service: Endoscopy;  Laterality: N/A;   EYE SURGERY Bilateral    Cataract Extraction with IOL   HERNIA REPAIR     INGUINAL HERNIA REPAIR Bilateral 01/23/2016   Procedure: HERNIA REPAIR INGUINAL ADULT BILATERAL;  Surgeon: Nadeen Landau, MD;  Location: [facility] ORS;  Service: General;  Laterality: Bilateral;   LEFT HEART CATH AND CORS/GRAFTS ANGIOGRAPHY N/A 12/13/2019   Procedure: LEFT HEART CATH AND CORS/GRAFTS ANGIOGRAPHY;  Surgeon: Iran Ouch, MD;  Location: [facility] INVASIVE CV LAB;  Service: Cardiovascular;  Laterality: N/A;   LEFT HEART CATH AND CORS/GRAFTS ANGIOGRAPHY N/A 06/20/2020   Procedure: LEFT HEART CATH AND CORS/GRAFTS ANGIOGRAPHY;  Surgeon: Yvonne Kendall, MD;  Location: [facility] INVASIVE CV LAB;  Service: Cardiovascular;  Laterality: N/A;   LUMBAR LAMINECTOMY/DECOMPRESSION MICRODISCECTOMY Left 10/01/2016   Procedure: Left Lumbar four-five Laminotomy for resection of synovial cyst;  Surgeon: Maeola Harman, MD;  [facility] OR;  Service: Neurosurgery;  Laterality: Left;  left   NECK SURGERY  06/2009   RIGHT/LEFT HEART CATH AND CORONARY/GRAFT ANGIOGRAPHY N/A 11/15/2020   Procedure: RIGHT/LEFT HEART CATH AND CORONARY/GRAFT ANGIOGRAPHY;  Surgeon: Yvonne Kendall, MD;  Location: [facility] INVASIVE CV LAB;  Service: Cardiovascular;  Laterality: N/A;   SHOULDER SURGERY Bilateral 2010     Current Outpatient Medications  Medication Sig Dispense Refill   acetaminophen (TYLENOL) 500 MG tablet Take 1,000 mg by mouth every 6 (six) hours as needed for mild pain.     albuterol (VENTOLIN HFA) 108 (90 Base) MCG/ACT inhaler Inhale into the lungs.     aspirin EC 81 MG EC tablet Take 1 tablet (81 mg total) by mouth daily. Swallow whole. 30 tablet 11   carvedilol (COREG) 3.125 MG tablet Take 3.125 mg by mouth 2 (two) times daily with a meal.     citalopram (CELEXA) 20 MG tablet Take 20 mg by mouth daily.      clobetasol cream (TEMOVATE) 0.05 % APPLY TO ITCHY AREAS ON ELBOWS TWICE A DAY UNTIL IMPROVED. AVOID FACE, GROIN, UNDERARMS. 30 g 1   clopidogrel (PLAVIX) 75 MG tablet TAKE 1 TABLET EVERY DAY 90 tablet 0   empagliflozin (JARDIANCE) 10 MG TABS tablet Take 1 tablet (10 mg total) by mouth daily before breakfast. 90 tablet 3   EPINEPHrine 0.3 mg/0.3 mL IJ SOAJ injection Inject 0.3 mg into the muscle as needed.     fexofenadine (ALLEGRA) 180 MG tablet Take 180 mg by mouth daily.     finasteride (PROSCAR) 5 MG tablet Take 5 mg by mouth  daily.     furosemide (LASIX) 40 MG tablet Take 40 mg by mouth daily.     gabapentin (NEURONTIN) 800 MG tablet Take 1 tablet by mouth 3 (three) times daily.     isosorbide mononitrate (IMDUR) 60 MG 24 hr tablet TAKE 1 TABLET EVERY DAY 90 tablet 1   losartan (COZAAR) 25 MG tablet Take 25 mg by mouth daily.     Multiple Vitamins-Minerals (HAIR SKIN AND NAILS FORMULA PO) Take 1 capsule by mouth daily.     mupirocin ointment (BACTROBAN) 2 % Apply twice daily to wound on leg 22 g 1   nitroGLYCERIN (NITROSTAT) 0.4 MG SL tablet Place 1 tablet (0.4 mg total) under the tongue every 5 (five) minutes as needed for chest pain. 90 tablet 1   nystatin-triamcinolone ointment (MYCOLOG) Apply 1 application topically 2 (two) times daily. 30 g 0   ondansetron (ZOFRAN) 4 MG tablet Take 4 mg by mouth every 8 (eight) hours as needed for nausea or vomiting.     oxyCODONE (OXY IR/ROXICODONE) 5 MG immediate release tablet Take 2.5-5 mg by mouth every 6 (six) hours as needed.     pantoprazole (PROTONIX) 40 MG tablet Take 40 mg by mouth 2 (two) times daily.      potassium chloride SA (KLOR-CON M) 20 MEQ tablet TAKE 1 TABLET EVERY DAY 90 tablet 3   Probiotic Product (PROBIOTIC PO) Take by mouth daily at 8 pm.     Psyllium (DAILY FIBER PO) Take 1 tablet by mouth daily.     rOPINIRole (REQUIP) 1 MG tablet Take 1 mg by mouth 2 (two) times daily.     rosuvastatin (CRESTOR) 40 MG tablet TAKE 1 TABLET EVERY DAY 90 tablet 1   spironolactone (ALDACTONE) 25 MG tablet TAKE 1 TABLET EVERY DAY 90 tablet 0   torsemide (DEMADEX) 20 MG tablet TAKE 2 TABLETS EVERY DAY 180  tablet 0   traMADol (ULTRAM) 50 MG tablet Take 1 tablet (50 mg total) by mouth every 6 (six) hours as needed for up to 15 doses for severe pain or moderate pain. 15 tablet 0   vitamin B-12 (CYANOCOBALAMIN) 1000 MCG tablet Take 1,000 mcg by mouth daily.     No current facility-administered medications for this visit.   Facility-Administered Medications Ordered in Other  Visits  Medication Dose Route Frequency Provider Last Rate Last Admin   indocyanine green (IC-GREEN) injection 5 mg  5 mg Intravenous Once Sakai, Isami, DO        Allergies:   Amitriptyline, Amoxicillin-pot clavulanate, Cyclobenzaprine, Dicyclomine, Sacubitril-valsartan, Amitriptyline hcl, Bacon flavoring agent (non-screening), Hydrocodone, Fish oil, Lidocaine, Omega-3 fatty acids-vitamin e, Other, Oxycodone-acetaminophen, and Sulfa antibiotics    Social History:  The patient  reports that he quit smoking about 31 years ago. His smoking use included cigarettes. He has never used smokeless tobacco. He reports that he does not currently use alcohol. He reports that he does not use drugs.   Family History:  The patient's family history includes Breast cancer in his paternal aunt; Heart attack in his father; Heart disease (age of onset: 39) in his father.    ROS:  Please see the history of present illness.   Otherwise, review of systems are positive for none.   All other systems are reviewed and negative.    PHYSICAL EXAM: VS:  BP 128/60 (BP Location: Left Arm, Patient Position: Sitting, Cuff Size: Normal)   Pulse 63   Ht 5' 8.5" (1.74 m)   Wt 195 lb 8 oz (88.7 kg)   SpO2 97%   BMI 29.29 kg/m  , BMI Body mass index is 29.29 kg/m. GEN: Well nourished, well developed, in no acute distress  HEENT: normal  Neck: no JVD, carotid bruits, or masses Cardiac: RRR; no murmurs, rubs, or gallops, mild bilateral leg edema Respiratory:  clear to auscultation bilaterally, normal work of breathing GI: soft, nontender, nondistended, + BS MS: no deformity or atrophy  Skin: warm and dry, no rash Neuro:  Strength and sensation are intact Psych: euthymic mood, full affect   EKG:  EKG is  ordered today EKG showed: Normal sinus rhythm Nonspecific ST abnormality    Recent Labs: No results found for requested labs within last 365 days.    Lipid Panel    Component Value Date/Time   CHOL 165  02/07/2020 0658   TRIG 317 (H) 02/07/2020 0658   HDL 41 02/07/2020 0658   CHOLHDL 4.0 02/07/2020 0658   VLDL 63 (H) 02/07/2020 0658   LDLCALC 61 02/07/2020 0658      Wt Readings from Last 3 Encounters:  04/01/23 195 lb 8 oz (88.7 kg)  09/24/22 209 lb 6.4 oz (95 kg)  08/03/22 202 lb (91.6 kg)         ASSESSMENT AND PLAN:  1.  Coronary artery disease involving native coronary arteries with other forms of angina: Stable episodes of chest pain overall.  Continue lifelong dual antiplatelet therapy as tolerated.  2.  Chronic systolic heart failure: Most recent echocardiogram in February of 2023 showed improvement in ejection fraction to 45 to 50%.  He appears to be euvolemic on torsemide 40 mg once daily and spironolactone 25 mg once daily.    He is allergic to [facility].  Given continued episode of symptomatic hypotension, I discontinued losartan again.  Continue carvedilol and Jardiance.  [place] Pepper is expensive but coverage might improve by January.  3. Essential  hypertension: Blood pressure is controlled.   4. Hyperlipidemia: Continue high-dose rosuvastatin and Vascepa.  Most recent lipid profile showed an LDL of 61 and triglyceride of 317.   5. Mild carotid disease: Previous carotid Doppler showed less than 40% stenosis bilaterally. No need to repeat.    Disposition: Follow-up in 6 months.   Signed,  Lorine Bears, MD  04/01/2023 3:03 PM    [facility]

## 2023-04-01 NOTE — Patient Instructions (Signed)
Medication Instructions:  STOP the Losartan   *If you need a refill on your cardiac medications before your next appointment, please call your pharmacy*   Lab Work: None ordered If you have labs (blood work) drawn today and your tests are completely normal, you will receive your results only by: MyChart Message (if you have MyChart) OR A paper copy in the mail If you have any lab test that is abnormal or we need to change your treatment, we will call you to review the results.   Testing/Procedures: None ordered   Follow-Up: At [facility], you and your health needs are our priority.  As part of our continuing mission to provide you with exceptional heart care, we have created designated Provider Care Teams.  These Care Teams include your primary Cardiologist (physician) and Advanced Practice Providers (APPs -  Physician Assistants and Nurse Practitioners) who all work together to provide you with the care you need, when you need it.  We recommend signing up for the patient portal called "MyChart".  Sign up information is provided on this After Visit Summary.  MyChart is used to connect with patients for Virtual Visits (Telemedicine).  Patients are able to view lab/test results, encounter notes, upcoming appointments, etc.  Non-urgent messages can be sent to your provider as well.   To learn more about what you can do with MyChart, go to [email or web address].    Your next appointment:   6 month(s)  Provider:   You may see Dr. Kirke Corin or one of the following Advanced Practice Providers on your designated Care Team:   Nicolasa Ducking, NP Eula Listen, PA-C Cadence Fransico Michael, PA-C Charlsie Quest, NP Carlos Levering, NP

## 2023-04-11 ENCOUNTER — Other Ambulatory Visit: Payer: Self-pay | Admitting: Cardiovascular Disease

## 2023-04-22 ENCOUNTER — Ambulatory Visit: Payer: Medicare HMO | Admitting: Anesthesiology

## 2023-04-22 ENCOUNTER — Other Ambulatory Visit: Payer: Self-pay

## 2023-04-22 ENCOUNTER — Ambulatory Visit
Admission: RE | Admit: 2023-04-22 | Discharge: 2023-04-22 | Disposition: A | Discharge status: Home / Self Care | Payer: Medicare HMO | Source: Ambulatory Visit | Attending: Gastroenterology | Admitting: Gastroenterology

## 2023-04-22 ENCOUNTER — Encounter: Payer: Self-pay | Admitting: *Deleted

## 2023-04-22 ENCOUNTER — Encounter
Admission: RE | Disposition: A | Discharge status: Home / Self Care | Payer: Self-pay | Source: Ambulatory Visit | Attending: Gastroenterology

## 2023-04-22 DIAGNOSIS — I4892 Unspecified atrial flutter: Secondary | ICD-10-CM | POA: Diagnosis not present

## 2023-04-22 DIAGNOSIS — I252 Old myocardial infarction: Secondary | ICD-10-CM | POA: Insufficient documentation

## 2023-04-22 DIAGNOSIS — K219 Gastro-esophageal reflux disease without esophagitis: Secondary | ICD-10-CM | POA: Diagnosis not present

## 2023-04-22 DIAGNOSIS — I2510 Atherosclerotic heart disease of native coronary artery without angina pectoris: Secondary | ICD-10-CM | POA: Diagnosis not present

## 2023-04-22 DIAGNOSIS — J449 Chronic obstructive pulmonary disease, unspecified: Secondary | ICD-10-CM | POA: Diagnosis not present

## 2023-04-22 DIAGNOSIS — Z87891 Personal history of nicotine dependence: Secondary | ICD-10-CM | POA: Insufficient documentation

## 2023-04-22 DIAGNOSIS — D122 Benign neoplasm of ascending colon: Secondary | ICD-10-CM | POA: Diagnosis not present

## 2023-04-22 DIAGNOSIS — Z7984 Long term (current) use of oral hypoglycemic drugs: Secondary | ICD-10-CM | POA: Diagnosis not present

## 2023-04-22 DIAGNOSIS — K529 Noninfective gastroenteritis and colitis, unspecified: Secondary | ICD-10-CM | POA: Insufficient documentation

## 2023-04-22 DIAGNOSIS — Z86711 Personal history of pulmonary embolism: Secondary | ICD-10-CM | POA: Diagnosis not present

## 2023-04-22 DIAGNOSIS — K640 First degree hemorrhoids: Secondary | ICD-10-CM | POA: Diagnosis not present

## 2023-04-22 DIAGNOSIS — D120 Benign neoplasm of cecum: Secondary | ICD-10-CM | POA: Diagnosis not present

## 2023-04-22 DIAGNOSIS — Z9049 Acquired absence of other specified parts of digestive tract: Secondary | ICD-10-CM | POA: Insufficient documentation

## 2023-04-22 DIAGNOSIS — Z980 Intestinal bypass and anastomosis status: Secondary | ICD-10-CM | POA: Insufficient documentation

## 2023-04-22 DIAGNOSIS — K5730 Diverticulosis of large intestine without perforation or abscess without bleeding: Secondary | ICD-10-CM | POA: Diagnosis not present

## 2023-04-22 DIAGNOSIS — I4891 Unspecified atrial fibrillation: Secondary | ICD-10-CM | POA: Insufficient documentation

## 2023-04-22 DIAGNOSIS — I5022 Chronic systolic (congestive) heart failure: Secondary | ICD-10-CM | POA: Insufficient documentation

## 2023-04-22 DIAGNOSIS — I110 Hypertensive heart disease with heart failure: Secondary | ICD-10-CM | POA: Insufficient documentation

## 2023-04-22 DIAGNOSIS — Z955 Presence of coronary angioplasty implant and graft: Secondary | ICD-10-CM | POA: Diagnosis not present

## 2023-04-22 DIAGNOSIS — G4733 Obstructive sleep apnea (adult) (pediatric): Secondary | ICD-10-CM | POA: Insufficient documentation

## 2023-04-22 DIAGNOSIS — Z951 Presence of aortocoronary bypass graft: Secondary | ICD-10-CM | POA: Diagnosis not present

## 2023-04-22 DIAGNOSIS — K2970 Gastritis, unspecified, without bleeding: Secondary | ICD-10-CM | POA: Insufficient documentation

## 2023-04-22 DIAGNOSIS — Z7902 Long term (current) use of antithrombotics/antiplatelets: Secondary | ICD-10-CM | POA: Diagnosis not present

## 2023-04-22 DIAGNOSIS — M797 Fibromyalgia: Secondary | ICD-10-CM | POA: Insufficient documentation

## 2023-04-22 HISTORY — DX: Benign prostatic hyperplasia without lower urinary tract symptoms: N40.0

## 2023-04-22 HISTORY — DX: Personal history of other diseases of male genital organs: Z87.438

## 2023-04-22 HISTORY — DX: Acute ischemic heart disease, unspecified: I24.9

## 2023-04-22 HISTORY — DX: Polyneuropathy, unspecified: G62.9

## 2023-04-22 HISTORY — DX: Dyshidrosis (pompholyx): L30.1

## 2023-04-22 HISTORY — DX: Migraine without aura, not intractable, without status migrainosus: G43.009

## 2023-04-22 HISTORY — DX: Bradycardia, unspecified: R00.1

## 2023-04-22 HISTORY — DX: Barrett's esophagus without dysplasia: K22.70

## 2023-04-22 HISTORY — DX: Obstructive and reflux uropathy, unspecified: N13.9

## 2023-04-22 HISTORY — DX: Carpal tunnel syndrome, unspecified upper limb: G56.00

## 2023-04-22 HISTORY — PX: POLYPECTOMY: SHX5525

## 2023-04-22 HISTORY — PX: BIOPSY: SHX5522

## 2023-04-22 HISTORY — DX: Occipital neuralgia: M54.81

## 2023-04-22 HISTORY — DX: Dysphagia, unspecified: R13.10

## 2023-04-22 HISTORY — DX: Peripheral vascular disease, unspecified: I73.9

## 2023-04-22 HISTORY — DX: Hypo-osmolality and hyponatremia: E87.1

## 2023-04-22 HISTORY — DX: Idiopathic urticaria: L50.1

## 2023-04-22 HISTORY — DX: Liver disease, unspecified: K76.9

## 2023-04-22 HISTORY — PX: ESOPHAGOGASTRODUODENOSCOPY (EGD) WITH PROPOFOL: SHX5813

## 2023-04-22 HISTORY — DX: Left ventricular failure, unspecified: I50.1

## 2023-04-22 HISTORY — DX: Other bursal cyst, other site: M71.38

## 2023-04-22 HISTORY — DX: Nail dystrophy: L60.3

## 2023-04-22 HISTORY — DX: Localized edema: R60.0

## 2023-04-22 HISTORY — PX: COLONOSCOPY WITH PROPOFOL: SHX5780

## 2023-04-22 LAB — GLUCOSE, CAPILLARY: Glucose-Capillary: 109 mg/dL — ABNORMAL HIGH (ref 70–99)

## 2023-04-22 SURGERY — COLONOSCOPY WITH PROPOFOL
Anesthesia: General

## 2023-04-22 MED ORDER — PHENYLEPHRINE 80 MCG/ML (10ML) SYRINGE FOR IV PUSH (FOR BLOOD PRESSURE SUPPORT)
PREFILLED_SYRINGE | INTRAVENOUS | Status: AC
  Filled 2023-04-22: qty 10

## 2023-04-22 MED ORDER — GLYCOPYRROLATE 0.2 MG/ML IJ SOLN
INTRAMUSCULAR | Status: DC | PRN
  Administered 2023-04-22: .1 mg via INTRAVENOUS

## 2023-04-22 MED ORDER — SODIUM CHLORIDE 0.9 % IV SOLN: INTRAVENOUS | Status: DC

## 2023-04-22 MED ORDER — PROPOFOL 500 MG/50ML IV EMUL
INTRAVENOUS | Status: DC | PRN
  Administered 2023-04-22: 125 ug/kg/min via INTRAVENOUS

## 2023-04-22 MED ORDER — PHENYLEPHRINE HCL (PRESSORS) 10 MG/ML IV SOLN
INTRAVENOUS | Status: DC | PRN
  Administered 2023-04-22: 120 ug via INTRAVENOUS

## 2023-04-22 MED ORDER — GLYCOPYRROLATE 0.2 MG/ML IJ SOLN
INTRAMUSCULAR | Status: AC
  Filled 2023-04-22: qty 1

## 2023-04-22 MED ORDER — PROPOFOL 10 MG/ML IV BOLUS
INTRAVENOUS | Status: DC | PRN
  Administered 2023-04-22: 30 mg via INTRAVENOUS
  Administered 2023-04-22: 20 mg via INTRAVENOUS
  Administered 2023-04-22: 30 mg via INTRAVENOUS
  Administered 2023-04-22: 100 mg via INTRAVENOUS

## 2023-04-22 NOTE — Op Note (Signed)
 [facility] Gastroenterology Patient Name: Willie Clark Procedure Date: 04/22/2023 10:01 AM MRN: [id number] Account #: [id number] Date of Birth: [birth year] Admit Type: Outpatient Age: 71 Room: [facility] ENDO ROOM 1 Gender: Male Note Status: Finalized Instrument Name: Upper Endoscope [id number] Procedure:             Upper GI endoscopy Indications:           Nausea Providers:             Ole Schick MD, MD Referring MD:          [facility], dr (Referring MD) Medicines:             Monitored Anesthesia Care Complications:         No immediate complications. Estimated blood loss:                         Minimal. Procedure:             Pre-Anesthesia Assessment:                        - Prior to the procedure, a History and Physical was                         performed, and patient medications and allergies were                         reviewed. The patient is competent. The risks and                         benefits of the procedure and the sedation options and                         risks were discussed with the patient. All questions                         were answered and informed consent was obtained.                         Patient identification and proposed procedure were                         verified by the physician, the nurse, the                         anesthesiologist, the anesthetist and the technician                         in the endoscopy suite. Mental Status Examination:                         alert and oriented. Airway Examination: normal                         oropharyngeal airway and neck mobility. Respiratory                         Examination: clear to auscultation. CV Examination:  normal. Prophylactic Antibiotics: The patient does not                         require prophylactic antibiotics. Prior                         Anticoagulants: The patient has taken Plavix                           (clopidogrel ), last dose was 8 days prior to                         procedure. ASA Grade Assessment: III - A patient with                         severe systemic disease. After reviewing the risks and                         benefits, the patient was deemed in satisfactory                         condition to undergo the procedure. The anesthesia                         plan was to use monitored anesthesia care (MAC).                         Immediately prior to administration of medications,                         the patient was re-assessed for adequacy to receive                         sedatives. The heart rate, respiratory rate, oxygen                         saturations, blood pressure, adequacy of pulmonary                         ventilation, and response to care were monitored                         throughout the procedure. The physical status of the                         patient was re-assessed after the procedure.                        After obtaining informed consent, the endoscope was                         passed under direct vision. Throughout the procedure,                         the patient's blood pressure, pulse, and oxygen                         saturations were monitored continuously. The Endoscope  was introduced through the mouth, and advanced to the                         second part of duodenum. The upper GI endoscopy was                         accomplished without difficulty. The patient tolerated                         the procedure well. Findings:      The examined esophagus was normal.      Patchy minimal inflammation characterized by erythema was found in the       gastric antrum. Biopsies were taken with a cold forceps for histology.       Estimated blood loss was minimal.      The examined duodenum was normal. Impression:            - Normal esophagus.                        - Gastritis. Biopsied.                         - Normal examined duodenum. Recommendation:        - Discharge patient to home.                        - Resume previous diet.                        - Resume Plavix  (clopidogrel ) at prior dose tomorrow.                        - Await pathology results.                        - Return to referring physician as previously                         scheduled. Procedure Code(s):     --- Professional ---                        [phone], Esophagogastroduodenoscopy, flexible,                         transoral; with biopsy, single or multiple Diagnosis Code(s):     --- Professional ---                        K29.70, Gastritis, unspecified, without bleeding                        R11.0, Nausea CPT copyright 2022 [employer]. All rights reserved. The codes documented in this report are preliminary and upon coder review may  be revised to meet current compliance requirements. Ole Schick MD, MD 04/22/2023 10:44:12 AM Number of Addenda: 0 Note Initiated On: 04/22/2023 10:01 AM Estimated Blood Loss:  Estimated blood loss was minimal.      [facility]

## 2023-04-22 NOTE — Anesthesia Postprocedure Evaluation (Signed)
 Anesthesia Post Note  Patient: RHYLAN KAGEL  Procedure(s) Performed: COLONOSCOPY WITH PROPOFOL  ESOPHAGOGASTRODUODENOSCOPY (EGD) WITH PROPOFOL  BIOPSY POLYPECTOMY  Patient location during evaluation: Endoscopy Anesthesia Type: General Level of consciousness: awake and alert Pain management: pain level controlled Vital Signs Assessment: post-procedure vital signs reviewed and stable Respiratory status: spontaneous breathing, nonlabored ventilation and respiratory function stable Cardiovascular status: blood pressure returned to baseline and stable Postop Assessment: no apparent nausea or vomiting Anesthetic complications: no   No notable events documented.   Last Vitals:  Vitals:   04/22/23 1055 04/22/23 1106  BP: 125/79 122/75  Pulse: 63 69  Resp: 16 16  Temp:    SpO2: 100% 100%    Last Pain:  Vitals:   04/22/23 1106  TempSrc:   PainSc: 0-No pain                 Camellia Merilee Louder

## 2023-04-22 NOTE — Anesthesia Preprocedure Evaluation (Addendum)
 Anesthesia Evaluation  Patient identified by MRN, date of birth, ID band Patient awake    Reviewed: Allergy & Precautions, NPO status , Patient's Chart, lab work & pertinent test results  History of Anesthesia Complications Negative for: history of anesthetic complications  Airway Mallampati: III  TM Distance: >3 FB Neck ROM: Full    Dental  (+) Poor Dentition, Missing,    Pulmonary shortness of breath and with exertion, sleep apnea and Continuous Positive Airway Pressure Ventilation , COPD,  COPD inhaler, former smoker S/p PE 2007    + wheezing      Cardiovascular Exercise Tolerance: Poor METS: 3 - Mets hypertension, + angina (stable at rest nearly once a day) at rest + CAD (s/p MI, CABG, stents on Plavix ), + Past MI, + CABG (status post coronary artery bypass graft surgery in 1993 and redo in 2007), + Peripheral Vascular Disease (Mild carotid artery disease), +CHF (HFrEF), + Orthopnea and + DOE  Normal cardiovascular exam Rhythm:Regular Rate:Normal  Myocardial perfusion 07/19/21:  No ST deviation was noted. Pharmacological myocardial perfusion imaging study with a small region of ischemia in the inferolateral wall Small fixed defect distal anterior/apical wall Fixed basal to mid septal wall perfusion defect Normal wall motion, EF estimated at 53% GI uptake artifact noted No EKG changes concerning for ischemia at peak stress or in recovery. Low to moderate risk scan  ECG 07/12/21: SR with occasional PACs  ASD (s/p closure)  2/23 ECHO: 1. Septal-lateral dyssynchrony. Left ventricular ejection fraction, by  estimation, is 45 to 50%. The left ventricle has mildly decreased  function. The left ventricle demonstrates global hypokinesis. The left  ventricular internal cavity size was mildly  dilated. There is mild left ventricular hypertrophy of the lateral  segment. Left ventricular diastolic parameters are consistent with Grade  I  diastolic dysfunction (impaired relaxation).  2. Right ventricular systolic function is normal. The right ventricular  size is moderately enlarged.  3. The mitral valve is normal in structure. Mild mitral valve  regurgitation. No evidence of mitral stenosis.  4. The aortic valve is tricuspid. Aortic valve regurgitation is trivial.  No aortic stenosis is present.   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 5/5 on exam in major muscle groups)    GI/Hepatic PUD,GERD  Controlled,,Diverticulitis    Endo/Other  negative endocrine ROS    Renal/GU Renal disease (nephrolithiasis)     Musculoskeletal  (+) Arthritis , Osteoarthritis,  Fibromyalgia -  Abdominal Normal abdominal exam  (+)   Peds  Hematology negative hematology ROS (+)   Anesthesia Other Findings    Reproductive/Obstetrics                             Anesthesia Physical Anesthesia Plan  ASA: 3  Anesthesia Plan: General   Post-op Pain Management:    Induction: Intravenous  PONV Risk Score and Plan: 2  Airway Management Planned: Natural Airway  Additional Equipment:   Intra-op Plan:   Post-operative Plan:   Informed Consent: I have reviewed the patients History and Physical, chart, labs and discussed the procedure including the risks, benefits and alternatives for the proposed anesthesia with the patient or authorized representative who has indicated his/her understanding and acceptance.     Dental advisory given  Plan Discussed with: CRNA and Anesthesiologist  Anesthesia Plan Comments:  Anesthesia Quick Evaluation

## 2023-04-22 NOTE — Interval H&P Note (Signed)
 History and Physical Interval Note:  04/22/2023 10:13 AM  Willie Clark  has presented today for surgery, with the diagnosis of Diarrhea nausea.  The various methods of treatment have been discussed with the patient and family. After consideration of risks, benefits and other options for treatment, the patient has consented to  Procedure(s): COLONOSCOPY WITH PROPOFOL  (N/A) ESOPHAGOGASTRODUODENOSCOPY (EGD) WITH PROPOFOL  (N/A) as a surgical intervention.  The patient's history has been reviewed, patient examined, no change in status, stable for surgery.  I have reviewed the patient's chart and labs.  Questions were answered to the patient's satisfaction.     Ole ONEIDA Schick  Ok to proceed with EGD/Colonoscopy

## 2023-04-22 NOTE — Op Note (Signed)
 [facility] Patient Name: Willie Clark Procedure Date: 04/22/2023 10:01 AM MRN: [id number] Account #: [id number] Date of Birth: Aug 28, 1950 Admit Type: Outpatient Age: 72 Room: [facility] ENDO ROOM 1 Gender: Male Note Status: Finalized Instrument Name: Arvis [id number] Procedure:             Colonoscopy Indications:           Chronic diarrhea Providers:             Ole Schick MD, MD Referring MD:          [facility], dr (Referring MD) Medicines:             Monitored Anesthesia Care Complications:         No immediate complications. Estimated blood loss:                         Minimal. Procedure:             Pre-Anesthesia Assessment:                        - Prior to the procedure, a History and Physical was                         performed, and patient medications and allergies were                         reviewed. The patient is competent. The risks and                         benefits of the procedure and the sedation options and                         risks were discussed with the patient. All questions                         were answered and informed consent was obtained.                         Patient identification and proposed procedure were                         verified by the physician, the nurse, the                         anesthesiologist, the anesthetist and the technician                         in the endoscopy suite. Mental Status Examination:                         alert and oriented. Airway Examination: normal                         oropharyngeal airway and neck mobility. Respiratory                         Examination: clear to auscultation. CV Examination:  normal. Prophylactic Antibiotics: The patient does not                         require prophylactic antibiotics. Prior                         Anticoagulants: The patient has taken Plavix                           (clopidogrel ), last dose was 8 days prior to                         procedure. ASA Grade Assessment: III - A patient with                         severe systemic disease. After reviewing the risks and                         benefits, the patient was deemed in satisfactory                         condition to undergo the procedure. The anesthesia                         plan was to use monitored anesthesia care (MAC).                         Immediately prior to administration of medications,                         the patient was re-assessed for adequacy to receive                         sedatives. The heart rate, respiratory rate, oxygen                         saturations, blood pressure, adequacy of pulmonary                         ventilation, and response to care were monitored                         throughout the procedure. The physical status of the                         patient was re-assessed after the procedure.                        After obtaining informed consent, the colonoscope was                         passed under direct vision. Throughout the procedure,                         the patient's blood pressure, pulse, and oxygen                         saturations were monitored continuously. The  Colonoscope was introduced through the anus and                         advanced to the the terminal ileum, with                         identification of the appendiceal orifice and IC                         valve. The colonoscopy was performed without                         difficulty. The patient tolerated the procedure well.                         The quality of the bowel preparation was good. The                         terminal ileum, ileocecal valve, appendiceal orifice,                         and rectum were photographed. Findings:      The perianal and digital rectal examinations were normal.      The terminal ileum appeared normal.       A 2 mm polyp was found in the cecum. The polyp was sessile. The polyp       was removed with a cold snare. Resection and retrieval were complete.       Estimated blood loss was minimal.      Three sessile polyps were found in the ascending colon. The polyps were       1 to 3 mm in size. These polyps were removed with a cold snare.       Resection and retrieval were complete. Estimated blood loss was minimal.      Scattered small-mouthed diverticula were found in the sigmoid colon,       descending colon and ascending colon.      Normal mucosa was found in the entire colon. Biopsies for histology were       taken with a cold forceps from the entire colon for evaluation of       microscopic colitis. Estimated blood loss was minimal.      There was evidence of a prior end-to-end colo-colonic anastomosis in the       sigmoid colon. This was patent and was characterized by healthy       appearing mucosa.      Internal hemorrhoids were found during retroflexion. The hemorrhoids       were Grade I (internal hemorrhoids that do not prolapse).      The exam was otherwise without abnormality on direct and retroflexion       views. Impression:            - The examined portion of the ileum was normal.                        - One 2 mm polyp in the cecum, removed with a cold                         snare. Resected and retrieved.                        -  Three 1 to 3 mm polyps in the ascending colon,                         removed with a cold snare. Resected and retrieved.                        - Diverticulosis in the sigmoid colon, in the                         descending colon and in the ascending colon.                        - Normal mucosa in the entire examined colon. Biopsied.                        - Patent end-to-end colo-colonic anastomosis,                         characterized by healthy appearing mucosa.                        - Internal hemorrhoids.                        - The  examination was otherwise normal on direct and                         retroflexion views. Recommendation:        - Discharge patient to home.                        - Resume previous diet.                        - Resume Plavix  (clopidogrel ) at prior dose tomorrow.                        - Await pathology results.                        - Repeat colonoscopy in 5 years for surveillance.                        - Return to referring physician as previously                         scheduled. Procedure Code(s):     --- Professional ---                        [phone], Colonoscopy, flexible; with removal of                         tumor(s), polyp(s), or other lesion(s) by snare                         technique                        45380, 59, Colonoscopy, flexible; with biopsy, single  or multiple Diagnosis Code(s):     --- Professional ---                        K64.0, First degree hemorrhoids                        D12.0, Benign neoplasm of cecum                        D12.2, Benign neoplasm of ascending colon                        Z98.0, Intestinal bypass and anastomosis status                        K52.9, Noninfective gastroenteritis and colitis,                         unspecified                        K57.30, Diverticulosis of large intestine without                         perforation or abscess without bleeding CPT copyright 2022 American Medical Association. All rights reserved. The codes documented in this report are preliminary and upon coder review may  be revised to meet current compliance requirements. Ole Schick MD, MD 04/22/2023 10:49:03 AM Number of Addenda: 0 Note Initiated On: 04/22/2023 10:01 AM Scope Withdrawal Time: 0 hours 8 minutes 6 seconds  Total Procedure Duration: 0 hours 13 minutes 42 seconds  Estimated Blood Loss:  Estimated blood loss was minimal.      [facility]

## 2023-04-22 NOTE — H&P (Signed)
 Outpatient short stay form Pre-procedure 04/22/2023  Willie ONEIDA Schick, MD  Primary Physician: [facility]  Reason for visit:  Nausea/Chronic diarrhea  History of present illness:    72 y/o gentleman with history of CAD, COPD, chronic diverticulitis s/p partial colectomy, and anxiety/depression here for EGD/Colonoscopy for chronic nausea/bloating and chronic loose stools. Last colonoscopy in 2023 before his partial colectomy with small polyp. Last dose of plavix  was > 5 days ago. No first degree relatives with GI malignancies but notes a lot of cancer in his family.    Current Facility-Administered Medications:    0.9 %  sodium chloride  infusion, , Intravenous, Continuous, Wilman Tucker, Willie ONEIDA, MD, Last Rate: 20 mL/hr at 04/22/23 1010, Continued from Pre-op at 04/22/23 1010   0.9 %  sodium chloride  infusion, , Intravenous, Continuous, Shannon Balthazar, Willie ONEIDA, MD  Facility-Administered Medications Ordered in Other Encounters:    indocyanine green  (IC-GREEN ) injection 5 mg, 5 mg, Intravenous, Once, Sakai, Isami, DO  Medications Prior to Admission  Medication Sig Dispense Refill Last Dose/Taking   acetaminophen  (TYLENOL ) 500 MG tablet Take 1,000 mg by mouth every 6 (six) hours as needed for mild pain.   04/21/2023   albuterol  (VENTOLIN  HFA) 108 (90 Base) MCG/ACT inhaler Inhale into the lungs.   04/21/2023   aspirin  EC 81 MG EC tablet Take 1 tablet (81 mg total) by mouth daily. Swallow whole. 30 tablet 11 04/21/2023   carvedilol  (COREG ) 3.125 MG tablet Take 3.125 mg by mouth 2 (two) times daily with a meal.   04/21/2023   citalopram  (CELEXA ) 20 MG tablet Take 20 mg by mouth daily.    04/21/2023   empagliflozin  (JARDIANCE ) 10 MG TABS tablet Take 1 tablet (10 mg total) by mouth daily before breakfast. 90 tablet 3 04/21/2023   fexofenadine (ALLEGRA) 180 MG tablet Take 180 mg by mouth daily.   04/21/2023   finasteride  (PROSCAR ) 5 MG tablet Take 5 mg by mouth daily.    04/21/2023   furosemide  (LASIX ) 40 MG tablet Take 40 mg by mouth daily.   04/21/2023   gabapentin  (NEURONTIN ) 800 MG tablet Take 1 tablet by mouth 3 (three) times daily.   04/21/2023   isosorbide  mononitrate (IMDUR ) 60 MG 24 hr tablet TAKE 1 TABLET EVERY DAY 90 tablet 1 04/21/2023   oxyCODONE  (OXY IR/ROXICODONE ) 5 MG immediate release tablet Take 2.5-5 mg by mouth every 6 (six) hours as needed.   04/21/2023   pantoprazole  (PROTONIX ) 40 MG tablet Take 40 mg by mouth 2 (two) times daily.    04/21/2023   rOPINIRole  (REQUIP ) 1 MG tablet Take 1 mg by mouth 2 (two) times daily.   04/21/2023   rosuvastatin  (CRESTOR ) 40 MG tablet TAKE 1 TABLET EVERY DAY 90 tablet 1 04/21/2023   spironolactone  (ALDACTONE ) 25 MG tablet TAKE 1 TABLET EVERY DAY 90 tablet 0 04/21/2023   torsemide  (DEMADEX ) 20 MG tablet TAKE 2 TABLETS EVERY DAY 180 tablet 0 04/21/2023   traMADol  (ULTRAM ) 50 MG tablet Take 1 tablet (50 mg total) by mouth every 6 (six) hours as needed for up to 15 doses for severe pain or moderate pain. 15 tablet 0 04/21/2023   clobetasol  cream (TEMOVATE ) 0.05 % APPLY TO ITCHY AREAS ON ELBOWS TWICE A DAY UNTIL IMPROVED. AVOID FACE, GROIN, UNDERARMS. 30 g 1    clopidogrel  (PLAVIX ) 75 MG tablet TAKE 1 TABLET EVERY DAY 90 tablet 0 04/14/2023   EPINEPHrine  0.3 mg/0.3 mL IJ SOAJ injection Inject 0.3 mg into the muscle as needed.  Multiple Vitamins-Minerals (HAIR SKIN AND NAILS FORMULA PO) Take 1 capsule by mouth daily.      mupirocin  ointment (BACTROBAN ) 2 % Apply twice daily to wound on leg 22 g 1    nitroGLYCERIN  (NITROSTAT ) 0.4 MG SL tablet Place 1 tablet (0.4 mg total) under the tongue every 5 (five) minutes as needed for chest pain. 90 tablet 1    nystatin -triamcinolone  ointment (MYCOLOG) Apply 1 application topically 2 (two) times daily. 30 g 0    ondansetron  (ZOFRAN ) 4 MG tablet Take 4 mg by mouth every 8 (eight) hours as needed for nausea or vomiting.      potassium chloride  SA (KLOR-CON  M) 20 MEQ tablet  TAKE 1 TABLET EVERY DAY 90 tablet 3    Probiotic Product (PROBIOTIC PO) Take by mouth daily at 8 pm.      Psyllium (DAILY FIBER PO) Take 1 tablet by mouth daily.      vitamin B-12 (CYANOCOBALAMIN ) 1000 MCG tablet Take 1,000 mcg by mouth daily.        Allergies  Allergen Reactions   Amitriptyline Other (See Comments)    Pt unsure of what happens Other reaction(s): Other (See Comments) Pt unsure of what happens Pt unsure of what happens   Amoxicillin -Pot Clavulanate Itching   Cyclobenzaprine Hives and Other (See Comments)    Pt unsure what hapens   Dicyclomine  Shortness Of Breath and Swelling   Sacubitril-Valsartan Anaphylaxis and Itching   Amitriptyline Hcl    Bacon Flavoring Agent (Non-Screening) Diarrhea   Hydrocodone     Fish Oil Itching   Lidocaine  Nausea And Vomiting, Other (See Comments) and Nausea Only    Nasal Spray (only time had reaction) Other reaction(s): Dizziness Nasal Spray (only time had reaction)   Omega-3 Fatty Acids-Vitamin E Itching   Other Diarrhea and Itching    Reactive agents: Onions, bacon, steak   Oxycodone -Acetaminophen  Other (See Comments)    Lowers BP Other reaction(s): Other (See Comments) Lowers BP Lowers BP   Sulfa Antibiotics Itching     Past Medical History:  Diagnosis Date   AAA (abdominal aortic aneurysm) (HCC) 02/23/2020   a.) AAA duplex 02/23/2020: measured 3.0 cm   Acute coronary syndrome (HCC)    Anxiety    Aortic atherosclerosis (HCC)    Arthritis    ASD (atrial septal defect) 01/20/2006   a.) noted on intraoperative TEE; s/p closure   Barrett esophagus    Barrett's esophagus    Bilateral leg edema    Bilateral occipital neuralgia    Bowel obstruction (HCC)    Carpal tunnel syndrome    CHF (congestive heart failure) (HCC)    Chronic chest pain    COPD (chronic obstructive pulmonary disease) (HCC)    Coronary artery disease    a.)CABG 93; Redo 07; b.)LHC 03/15: 100 mLAD, 60 pLCx, 100 OM1, 80 pRCA, 100 mRCA - med mgmt.  c.)LHC 6/17: LAD 100ost,  LCx 60p, OM1 100, 100 oRCA, VG-OM2 min irreg, VG-RPDA 30ost, 20d - med mgmt; d.)LHC 8/21: 100 oLAD, 100 oOM1-OM1, 100 o-pRCA, 100 RPL2, 100 RPL3, 100 OM2; pat grafts - med mgmt; e.)LHC 3/22: 60-70 o-pVG-OM2, 80-99 VG-RCA (DES x3); f.)R/LHC 07/22: 75 (ost) & 60 (mid) VG-OM (DES x2)   Depression    Diverticulitis    Dyshidrosis    Dysphagia    Enlarged prostate without lower urinary tract symptoms (luts)    Erectile dysfunction    Fibromyalgia    Gastric ulcer    Gastritis    GERD (gastroesophageal reflux disease)  Headache    Hemorrhoids    HFrEF (heart failure with reduced ejection fraction) (HCC)    a.) 09/2016 Echo: EF 55-60%, no rwma, triv AI, mild MR, mildly dil LA. -> as of April 2020: EF 40 to 45%.  Septal dyssynergy/hypokinesis due to postop state but otherwise unable to assess wall motion Sharol due to poor study. b.) TTE 12/12/2019: EF 40-45%, LAE, mild AR, G1DD; c.) TTE 06/20/2020: EF 35-40%, sev inferolateral HK, mild AR, G1DD.   History of BPH    History of kidney stones    History of myocardial infarct at age less than 60 years    Hyperlipidemia    Hypertension    Hyponatremia    Idiopathic urticaria    Intermittent claudication (HCC)    Kidney stones    Left ventricular failure (HCC)    Lightheadedness    Liver disease    Long term current use of antithrombotics/antiplatelets    a.) daily DAPT therapy (ASA + clopidogrel )   Mass on back    Migraine without aura    Myocardial infarction (HCC) 1993   Nail dystrophy    Nephrolithiasis    Obstructive uropathy    Orthostatic hypotension    a. Improved after discontinuation of metoprolol .   OSA on CPAP    Peripheral neuropathy    Peripheral neuropathy    Peyronie disease    Postoperative atrial fibrillation/flutter (HCC) 01/20/2006   a.) POD2 following re-do CABG; Tx'd with IV amiodarone, which did not convert; EF 20% on POD3; DCCV (100 J x 1) POD4.   Pulmonary embolism (HCC) 01/2006    Post-op/treated   RLS (restless legs syndrome)    a.) on ropinirole    S/P CABG x 2 01/24/1992   a.) 2v CABG: LIMA-LAD, SVG-D2   S/P cervical spinal fusion    S/P RE-DO CABG x 2 01/20/2006   a.) 2v REDO CABG: reserse SVG-PDA, SVG-OM1   Sinus bradycardia    Skin lesions, generalized    Small bowel obstruction (HCC) 02/2009   ST elevation myocardial infarction (STEMI) of inferior wall (HCC) 06/2020   a.) LHC 06/2020 --> subtotally occluded SVG-RCA with long segment of proximal graft disease and 99% stenosis at the distal anastomosis --> successful PCI to SVG to RCA with overlapping Resolute Onyx DES x 3   Stroke [facility]) 1993   right side weakness no blood thinners   on Aspirin    Syncope and collapse    Synovial cyst of lumbar facet joint    TIA (transient ischemic attack) 08/13/2014   Urticaria     Review of systems:  Otherwise negative.    Physical Exam  Gen: Alert, oriented. Appears stated age.  HEENT: PERRLA. Lungs: No respiratory distress CV: RRR Abd: soft, benign, no masses Ext: No edema    Planned procedures: Proceed with EGD/colonoscopy. The patient understands the nature of the planned procedure, indications, risks, alternatives and potential complications including but not limited to bleeding, infection, perforation, damage to internal organs and possible oversedation/side effects from anesthesia. The patient agrees and gives consent to proceed.  Please refer to procedure notes for findings, recommendations and patient disposition/instructions.     Willie ONEIDA Schick, MD [facility]

## 2023-04-22 NOTE — Transfer of Care (Signed)
 Immediate Anesthesia Transfer of Care Note  Patient: Willie Clark  Procedure(s) Performed: COLONOSCOPY WITH PROPOFOL  ESOPHAGOGASTRODUODENOSCOPY (EGD) WITH PROPOFOL  BIOPSY POLYPECTOMY  Patient Location: PACU  Anesthesia Type:General  Level of Consciousness: drowsy  Airway & Oxygen Therapy: Patient Spontanous Breathing  Post-op Assessment: Report given to RN and Post -op Vital signs reviewed and stable  Post vital signs: Reviewed and stable  Last Vitals:  Vitals Value Taken Time  BP 131/88 04/22/23 1045  Temp 35.6 C 04/22/23 1045  Pulse 79 04/22/23 1045  Resp 20 04/22/23 1045  SpO2 99 % 04/22/23 1045    Last Pain:  Vitals:   04/22/23 1045  TempSrc: Temporal  PainSc: 0-No pain         Complications: No notable events documented.

## 2023-04-24 ENCOUNTER — Encounter: Payer: Self-pay | Admitting: Gastroenterology

## 2023-04-29 LAB — SURGICAL PATHOLOGY

## 2023-05-09 ENCOUNTER — Other Ambulatory Visit: Payer: Self-pay | Admitting: Cardiovascular Disease

## 2023-05-26 ENCOUNTER — Telehealth: Payer: Self-pay | Admitting: Cardiovascular Disease

## 2023-05-26 DIAGNOSIS — I10 Essential (primary) hypertension: Secondary | ICD-10-CM

## 2023-05-26 DIAGNOSIS — I5022 Chronic systolic (congestive) heart failure: Secondary | ICD-10-CM

## 2023-05-26 MED ORDER — CLOPIDOGREL BISULFATE 75 MG PO TABS: ORAL_TABLET | ORAL | 2 refills | Status: DC

## 2023-05-26 MED ORDER — EMPAGLIFLOZIN 10 MG PO TABS: 10.0000 mg | ORAL_TABLET | Freq: Every day | ORAL | 2 refills | Status: DC

## 2023-05-26 MED ORDER — ROSUVASTATIN CALCIUM 40 MG PO TABS: 40.0000 mg | ORAL_TABLET | Freq: Every day | ORAL | 2 refills | Status: DC

## 2023-05-26 MED ORDER — ISOSORBIDE MONONITRATE ER 60 MG PO TB24: 60.0000 mg | ORAL_TABLET | Freq: Every day | ORAL | 2 refills | Status: DC

## 2023-05-26 MED ORDER — TORSEMIDE 20 MG PO TABS: 40.0000 mg | ORAL_TABLET | Freq: Every day | ORAL | 2 refills | Status: DC

## 2023-05-26 MED ORDER — POTASSIUM CHLORIDE CRYS ER 20 MEQ PO TBCR: 20.0000 meq | EXTENDED_RELEASE_TABLET | Freq: Every day | ORAL | 2 refills | Status: DC

## 2023-05-26 MED ORDER — SPIRONOLACTONE 25 MG PO TABS: 25.0000 mg | ORAL_TABLET | Freq: Every day | ORAL | 2 refills | Status: DC

## 2023-05-26 NOTE — Telephone Encounter (Signed)
*  STAT* If patient is at the pharmacy, call can be transferred to refill team.   1. Which medications need to be refilled? (please list name of each medication and dose if known) pt requesting all medications that we fill to be sent [employer] which is coming up [facility]  2. Which pharmacy/location (including street and city if local pharmacy) is medication to be sent to? [facility] - [place], [place] - [address] Portal to [facility] Sites [phone]   3. Do they need a 30 day or 90 day supply? 90

## 2023-05-26 NOTE — Telephone Encounter (Signed)
Requested Prescriptions   Signed Prescriptions Disp Refills   clopidogrel (PLAVIX) 75 MG tablet 90 tablet 2    Sig: TAKE 1 TABLET EVERY DAY    Authorizing Provider: Lorine Bears A    Ordering User: Katrinka Blazing, Swayze Pries L   empagliflozin (JARDIANCE) 10 MG TABS tablet 90 tablet 2    Sig: Take 1 tablet (10 mg total) by mouth daily before breakfast.    Authorizing Provider: Lorine Bears A    Ordering User: Feliberto Harts L   isosorbide mononitrate (IMDUR) 60 MG 24 hr tablet 90 tablet 2    Sig: Take 1 tablet (60 mg total) by mouth daily.    Authorizing Provider: Lorine Bears A    Ordering User: Feliberto Harts L   potassium chloride SA (KLOR-CON M) 20 MEQ tablet 90 tablet 2    Sig: Take 1 tablet (20 mEq total) by mouth daily.    Authorizing Provider: Lorine Bears A    Ordering User: Feliberto Harts L   rosuvastatin (CRESTOR) 40 MG tablet 90 tablet 2    Sig: Take 1 tablet (40 mg total) by mouth daily.    Authorizing Provider: Lorine Bears A    Ordering User: Guerry Minors   spironolactone (ALDACTONE) 25 MG tablet 90 tablet 2    Sig: Take 1 tablet (25 mg total) by mouth daily.    Authorizing Provider: Lorine Bears A    Ordering User: Guerry Minors   torsemide (DEMADEX) 20 MG tablet 180 tablet 2    Sig: Take 2 tablets (40 mg total) by mouth daily.    Authorizing Provider: Lorine Bears A    Ordering User: Guerry Minors   Last office visit:  04/01/23  with plan to f/u in 6 months  Next office visit:  none/active recall

## 2023-05-26 NOTE — Telephone Encounter (Signed)
Called patient to confirm pharmacy and which medications are needing to be filled.  Not in need of medications now but wants to get submitted to new mail order pharmacy for processing.

## 2023-06-02 ENCOUNTER — Ambulatory Visit: Payer: Medicare HMO | Attending: Nurse Practitioner | Admitting: Nurse Practitioner

## 2023-06-02 ENCOUNTER — Telehealth: Payer: Self-pay | Admitting: Cardiovascular Disease

## 2023-06-02 ENCOUNTER — Encounter: Payer: Self-pay | Admitting: Nurse Practitioner

## 2023-06-02 DIAGNOSIS — Z01810 Encounter for preprocedural cardiovascular examination: Secondary | ICD-10-CM

## 2023-06-02 NOTE — Progress Notes (Signed)
 Virtual Visit via Telephone Note   Because of Willie Clark's co-morbid illnesses, he is at least at moderate risk for complications without adequate follow up.  This format is felt to be most appropriate for this patient at this time.  The patient did not have access to video technology/had technical difficulties with video requiring transitioning to audio format only (telephone).  All issues noted in this document were discussed and addressed.  No physical exam could be performed with this format.  Please refer to the patient's chart for his consent to telehealth for [facility].  Evaluation Performed:  Preoperative cardiovascular risk assessment _____________   Date:  06/02/2023   Patient ID:  Willie Clark, DOB Jan 08, 1951, MRN [id number] Patient Location:  Home Provider location:   Office  Primary Care Provider:  [facility] Primary Cardiologist:  Willie Kirks, MD  Chief Complaint / Patient Profile   73 y.o. y/o male with a h/o CAD s/p CABG 1993, redo CABG 2007, DES x 2 to VG to OM, COPD, chronic HFrEF, HTN, hyperlipidemia, mild carotid artery disease who is pending Right C7-T1 injection and presents today for telephonic preoperative cardiovascular risk assessment.  History of Present Illness    Willie Clark is a 73 y.o. male who presents via audio/video conferencing for a telehealth visit today.  Pt was last seen in cardiology clinic on 04/01/23 by Willie Clark.  At that time Willie Clark was doing well.  The patient is now pending procedure as outlined above. Since his last visit, he denies chest pain, shortness of breath, lower extremity edema, fatigue, palpitations, melena, hematuria, hemoptysis, diaphoresis, weakness, presyncope, syncope, orthopnea, and PND. He remains active with walking on the treadmill, house work and yard work and is able to achieve > 4 METS Activity without concerning cardiac symptoms.    Past Medical History    Past  Medical History:  Diagnosis Date   AAA (abdominal aortic aneurysm) (HCC) 02/23/2020   a.) AAA duplex 02/23/2020: measured 3.0 cm   Acute coronary syndrome (HCC)    Anxiety    Aortic atherosclerosis (HCC)    Arthritis    ASD (atrial septal defect) 01/20/2006   a.) noted on intraoperative TEE; s/p closure   Barrett esophagus    Barrett's esophagus    Bilateral leg edema    Bilateral occipital neuralgia    Bowel obstruction (HCC)    Carpal tunnel syndrome    CHF (congestive heart failure) (HCC)    Chronic chest pain    COPD (chronic obstructive pulmonary disease) (HCC)    Coronary artery disease    a.)CABG 93; Redo 07; b.)LHC 03/15: 100 mLAD, 60 pLCx, 100 OM1, 80 pRCA, 100 mRCA - med mgmt. c.)LHC 6/17: LAD 100ost,  LCx 60p, OM1 100, 100 oRCA, VG-OM2 min irreg, VG-RPDA 30ost, 20d - med mgmt; d.)LHC 8/21: 100 oLAD, 100 oOM1-OM1, 100 o-pRCA, 100 RPL2, 100 RPL3, 100 OM2; pat grafts - med mgmt; e.)LHC 3/22: 60-70 o-pVG-OM2, 80-99 VG-RCA (DES x3); f.)R/LHC 07/22: 75 (ost) & 60 (mid) VG-OM (DES x2)   Depression    Diverticulitis    Dyshidrosis    Dysphagia    Enlarged prostate without lower urinary tract symptoms (luts)    Erectile dysfunction    Fibromyalgia    Gastric ulcer    Gastritis    GERD (gastroesophageal reflux disease)    Headache    Hemorrhoids    HFrEF (heart failure with reduced ejection fraction) (HCC)  a.) 09/2016 Echo: EF 55-60%, no rwma, triv AI, mild MR, mildly dil LA. -> as of April 2020: EF 40 to 45%.  Septal dyssynergy/hypokinesis due to postop state but otherwise unable to assess wall motion [name] due to poor study. b.) TTE 12/12/2019: EF 40-45%, LAE, mild AR, G1DD; c.) TTE 06/20/2020: EF 35-40%, sev inferolateral HK, mild AR, G1DD.   History of BPH    History of kidney stones    History of myocardial infarct at age less than 60 years    Hyperlipidemia    Hypertension    Hyponatremia    Idiopathic urticaria    Intermittent claudication (HCC)    Kidney  stones    Left ventricular failure (HCC)    Lightheadedness    Liver disease    Long term current use of antithrombotics/antiplatelets    a.) daily DAPT therapy (ASA + clopidogrel )   Mass on back    Migraine without aura    Myocardial infarction (HCC) 1993   Nail dystrophy    Nephrolithiasis    Obstructive uropathy    Orthostatic hypotension    a. Improved after discontinuation of metoprolol .   OSA on CPAP    Peripheral neuropathy    Peripheral neuropathy    Peyronie disease    Postoperative atrial fibrillation/flutter (HCC) 01/20/2006   a.) POD2 following re-do CABG; Tx'd with IV amiodarone, which did not convert; EF 20% on POD3; DCCV (100 J x 1) POD4.   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   a.) 2v CABG: LIMA-LAD, SVG-D2   S/P cervical spinal fusion    S/P RE-DO CABG x 2 01/20/2006   a.) 2v REDO CABG: reserse SVG-PDA, SVG-OM1   Sinus bradycardia    Skin lesions, generalized    Small bowel obstruction (HCC) 02/2009   ST elevation myocardial infarction (STEMI) of inferior wall (HCC) 06/2020   a.) LHC 06/2020 --> subtotally occluded SVG-RCA with long segment of proximal graft disease and 99% stenosis at the distal anastomosis --> successful PCI to SVG to RCA with overlapping Resolute Onyx DES x 3   Stroke Willie Clark) 1993   right side weakness no blood thinners   on Aspirin    Syncope and collapse    Synovial cyst of lumbar facet joint    TIA (transient ischemic attack) 08/13/2014   Urticaria    Past Surgical History:  Procedure Laterality Date   BACK SURGERY     BIOPSY  04/22/2023   Procedure: BIOPSY;  Surgeon: Willie Darling, MD;  Location: [facility] ENDOSCOPY;  Service: Endoscopy;;  Gastric and Colonic   bowel obstruction  01/2009   CARDIAC CATHETERIZATION  11/2006   patent grafts. Significant OM3 disease and occluded diagonals.   CARDIAC CATHETERIZATION  06/2010   patent grafts.  significant ISR in proximal LCX but OM2 is bypassed and gives retrograde flow to OM3.    CARDIAC CATHETERIZATION  06/2013   [facility]: Patent grafts with 60% proximal in-stent restenosis in the left circumflex with FFR of 0.85   CARDIAC CATHETERIZATION Left 09/25/2015   Procedure: Left Heart Cath and Cors/Grafts Angiography;  Surgeon: Wenona Hamilton, MD;  Location: [facility] INVASIVE CV LAB;  Service: Cardiovascular;  Laterality: Left;   CATARACT EXTRACTION     CERVICAL FUSION     CHOLECYSTECTOMY     COLON SURGERY  01/2009   BOWEL RESECTION DUE TO SMALL BOWEL OBSTRUCTION   COLONOSCOPY  COLONOSCOPY WITH PROPOFOL  N/A 10/28/2017   Procedure: COLONOSCOPY WITH PROPOFOL ;  Surgeon: Deveron Fly, MD;  Location[facility] ENDOSCOPY;  Service: Endoscopy;  Laterality: N/A;   COLONOSCOPY WITH PROPOFOL  N/A 07/30/2021   Procedure: COLONOSCOPY WITH PROPOFOL ;  Surgeon: Willie Darling, MD;  Location: [facility] ENDOSCOPY;  Service: Endoscopy;  Laterality: N/A;   COLONOSCOPY WITH PROPOFOL  N/A 04/22/2023   Procedure: COLONOSCOPY WITH PROPOFOL ;  Surgeon: Willie Darling, MD;  Location: [facility] ENDOSCOPY;  Service: Endoscopy;  Laterality: N/A;   CORONARY ANGIOPLASTY  2006   CORONARY ARTERY BYPASS GRAFT  1993/01/2006   redo at [facility]. LIMA to LAD, SVG to OM2 and SVG to RPDA   CORONARY STENT INTERVENTION N/A 06/20/2020   Procedure: CORONARY STENT INTERVENTION;  Surgeon: Sammy Crisp, MD;  Location: [facility] INVASIVE CV LAB;  Service: Cardiovascular;  Laterality: N/A;   CORONARY STENT INTERVENTION N/A 11/15/2020   Procedure: CORONARY STENT INTERVENTION;  Surgeon: Sammy Crisp, MD;  Location: [facility] INVAS[facility] CV LAB;  Service: Cardiovascular;  Laterality: N/A;   CYSTOSCOPY WITH STENT PLACEMENT Bilateral 09/24/2015   Procedure: CYSTOSCOPY WITH BILATERAL RETROGRADES, BILATERAL STENT PLACEMENT;  Surgeon: Christina Coyer, MD;  Location: [facility] ORS;  Service: Urology;  Laterality: Bilateral;   ESOPHAGOGASTRODUODENOSCOPY N/A  10/25/2014   Procedure: ESOPHAGOGASTRODUODENOSCOPY (EGD);  Surgeon: Deveron Fly, MD;  Location: [facility] ENDOSCOPY;  Service: Endoscopy;  Laterality: N/A;   ESOPHAGOGASTRODUODENOSCOPY (EGD) WITH PROPOFOL  N/A 02/03/2017   Procedure: ESOPHAGOGASTRODUODENOSCOPY (EGD) WITH PROPOFOL ;  Surgeon: Deveron Fly, MD;  Location: [facility] ENDOSCOPY;  Service: Endoscopy;  Laterality: N/A;   ESOPHAGOGASTRODUODENOSCOPY (EGD) WITH PROPOFOL  N/A 04/18/2017   Procedure: ESOPHAGOGASTRODUODENOSCOPY (EGD) WITH PROPOFOL ;  Surgeon: Deveron Fly, MD;  Location: [facility] ENDOSCOPY;  Service: Endoscopy;  Laterality: N/A;   ESOPHAGOGASTRODUODENOSCOPY (EGD) WITH PROPOFOL  N/A 04/22/2023   Procedure: ESOPHAGOGASTRODUODENOSCOPY (EGD) WITH PROPOFOL ;  Surgeon: Willie Darling, MD;  Location: [facility] ENDOSCOPY;  Service: Endoscopy;  Laterality: N/A;   EYE SURGERY Bilateral    Cataract Extraction with IOL   HERNIA REPAIR     INGUINAL HERNIA REPAIR Bilateral 01/23/2016   Procedure: HERNIA REPAIR INGUINAL ADULT BILATERAL;  Surgeon: Benancio Bracket, MD;  Location: [facility] ORS;  Service: General;  Laterality: Bilateral;   LEFT HEART CATH AND CORS/GRAFTS ANGIOGRAPHY N/A 12/13/2019   Procedure: LEFT HEART CATH AND CORS/GRAFTS ANGIOGRAPHY;  Surgeon: Wenona Hamilton, MD;  Location: [facility] INVASIVE CV LAB;  Service: Cardiovascular;  Laterality: N/A;   LEFT HEART CATH AND CORS/GRAFTS ANGIOGRAPHY N/A 06/20/2020   Procedure: LEFT HEART CATH AND CORS/GRAFTS ANGIOGRAPHY;  Surgeon: Sammy Crisp, MD;  Location: [facility] INVASIVE CV LAB;  Service: Cardiovascular;  Laterality: N/A;   LUMBAR LAMINECTOMY/DECOMPRESSION MICRODISCECTOMY Left 10/01/2016   Procedure: Left Lumbar four-five Laminotomy for resection of synovial cyst;  Surgeon: Manya Sells, MD;  [facility] OR;  Service: Neurosurgery;  Laterality: Left;  left   NECK SURGERY  06/2009   POLYPECTOMY  04/22/2023   Procedure: POLYPECTOMY;  Surgeon: Willie Darling, MD;  Location:  [facility] ENDOSCOPY;  Service: Endoscopy;;  Colon   RIGHT/LEFT HEART CATH AND CORONARY/GRAFT ANGIOGRAPHY N/A 11/15/2020   Procedure: RIGHT/LEFT HEART CATH AND CORONARY/GRAFT ANGIOGRAPHY;  Surgeon: Sammy Crisp, MD;  Location: [facility] INVASIVE CV LAB;  Service: Cardiovascular;  Laterality: N/A;   SHOULDER SURGERY Bilateral 2010    Allergies  Allergies  Allergen Reactions   Amitriptyline Other (See Comments)    Pt unsure of what happens Other reaction(s): Other (See Comments) Pt unsure of what happens Pt unsure of what happens   Amoxicillin -Pot Clavulanate  Itching   Cyclobenzaprine Hives and Other (See Comments)    Pt unsure what hapens   Dicyclomine  Shortness Of Breath and Swelling   Sacubitril-Valsartan Anaphylaxis and Itching   Amitriptyline Hcl    Bacon Flavoring Agent (Non-Screening) Diarrhea   Hydrocodone     Fish Oil Itching   Lidocaine  Nausea And Vomiting, Other (See Comments) and Nausea Only    Nasal Spray (only time had reaction) Other reaction(s): Dizziness Nasal Spray (only time had reaction)   Omega-3 Fatty Acids-Vitamin E Itching   Other Diarrhea and Itching    Reactive agents: Onions, bacon, steak   Oxycodone -Acetaminophen  Other (See Comments)    Lowers BP Other reaction(s): Other (See Comments) Lowers BP Lowers BP   Sulfa Antibiotics Itching    Home Medications    Prior to Admission medications   Medication Sig Start Date End Date Taking? Authorizing Provider  acetaminophen  (TYLENOL ) 500 MG tablet Take 1,000 mg by mouth every 6 (six) hours as needed for mild pain.    [provider]  albuterol  (VENTOLIN  HFA) 108 (90 Base) MCG/ACT inhaler Inhale into the lungs. 11/01/21   [provider]  aspirin  EC 81 MG EC tablet Take 1 tablet (81 mg total) by mouth daily. Swallow whole. 06/23/20   Garnet Just, MD  carvedilol  (COREG ) 3.125 MG tablet Take 3.125 mg by mouth 2 (two) times daily with a meal.    [provider]  citalopram  (CELEXA ) 20 MG  tablet Take 20 mg by mouth daily.     [provider]  clobetasol  cream (TEMOVATE ) 0.05 % APPLY TO ITCHY AREAS ON ELBOWS TWICE A DAY UNTIL IMPROVED. AVOID FACE, GROIN, UNDERARMS. 01/09/23   Artemio Larry, MD  clopidogrel  (PLAVIX ) 75 MG tablet TAKE 1 TABLET EVERY DAY 05/26/23   Wenona Hamilton, MD  empagliflozin  (JARDIANCE ) 10 MG TABS tablet Take 1 tablet (10 mg total) by mouth daily before breakfast. 05/26/23   Wenona Hamilton, MD  EPINEPHrine  0.3 mg/0.3 mL IJ SOAJ injection Inject 0.3 mg into the muscle as needed. 12/27/19   [provider]  fexofenadine (ALLEGRA) 180 MG tablet Take 180 mg by mouth daily.    [provider]  finasteride  (PROSCAR ) 5 MG tablet Take 5 mg by mouth daily.    [provider]  furosemide  (LASIX ) 40 MG tablet Take 40 mg by mouth daily.    [provider]  gabapentin  (NEURONTIN ) 800 MG tablet Take 1 tablet by mouth 3 (three) times daily. 12/22/19   [provider]  isosorbide  mononitrate (IMDUR ) 60 MG 24 hr tablet Take 1 tablet (60 mg total) by mouth daily. 05/26/23   Wenona Hamilton, MD  Multiple Vitamins-Minerals (HAIR SKIN AND NAILS FORMULA PO) Take 1 capsule by mouth daily.    [provider]  mupirocin  ointment (BACTROBAN ) 2 % Apply twice daily to wound on leg 09/03/22   Artemio Larry, MD  nitroGLYCERIN  (NITROSTAT ) 0.4 MG SL tablet Place 1 tablet (0.4 mg total) under the tongue every 5 (five) minutes as needed for chest pain. 06/22/20   Garnet Just, MD  nystatin -triamcinolone  ointment (MYCOLOG) Apply 1 application topically 2 (two) times daily. 03/14/21   Lawerence Pressman, MD  ondansetron  (ZOFRAN ) 4 MG tablet Take 4 mg by mouth every 8 (eight) hours as needed for nausea or vomiting.    [provider]  oxyCODONE  (OXY IR/ROXICODONE ) 5 MG immediate release tablet Take 2.5-5 mg by mouth every 6 (six) hours as needed. 10/18/21   [provider]  pantoprazole  (  PROTONIX ) 40 MG tablet Take 40 mg by mouth  2 (two) times daily.     [provider]  potassium chloride  SA (KLOR-CON  M) 20 MEQ tablet Take 1 tablet (20 mEq total) by mouth daily. 05/26/23   Wenona Hamilton, MD  Probiotic Product (PROBIOTIC PO) Take by mouth daily at 8 pm.    [provider]  Psyllium (DAILY FIBER PO) Take 1 tablet by mouth daily.    [provider]  rOPINIRole  (REQUIP ) 1 MG tablet Take 1 mg by mouth 2 (two) times daily. 05/18/20   [provider]  rosuvastatin  (CRESTOR ) 40 MG tablet Take 1 tablet (40 mg total) by mouth daily. 05/26/23   Wenona Hamilton, MD  spironolactone  (ALDACTONE ) 25 MG tablet Take 1 tablet (25 mg total) by mouth daily. 05/26/23   Wenona Hamilton, MD  torsemide  (DEMADEX ) 20 MG tablet Take 2 tablets (40 mg total) by mouth daily. 05/26/23   Wenona Hamilton, MD  traMADol  (ULTRAM ) 50 MG tablet Take 1 tablet (50 mg total) by mouth every 6 (six) hours as needed for up to 15 doses for severe pain or moderate pain. 10/17/21   Sakai, Isami, DO  vitamin B-12 (CYANOCOBALAMIN ) 1000 MCG tablet Take 1,000 mcg by mouth daily.    [provider]    Physical Exam    Vital Signs:  HAILEY GRAHAM does not have vital signs available for review today.  Given telephonic nature of communication, physical exam is limited. AAOx3. NAD. Normal affect.  Speech and respirations are unlabored.  Accessory Clinical Findings    None  Assessment & Plan    1.  Preoperative Cardiovascular Risk Assessment: According to the Revised Cardiac Risk Index (RCRI), his Perioperative Risk of Major Cardiac Event is (%): 11. His Functional Capacity in METs is: 7.59 according to the Duke Activity Status Index (DASI). The patient is doing well from a cardiac perspective. Therefore, based on ACC/AHA guidelines, the patient would be at acceptable risk for the planned procedure without further cardiovascular testing.   The patient was advised that if he develops new symptoms prior to surgery to contact our  office to arrange for a follow-up visit, and he verbalized understanding.  Per office protocol, he may hold Plavix  for 7 days prior to procedure and should resume as soon as hemodynamically stable postoperatively, preferably the day after the procedure.    A copy of this note will be routed to requesting surgeon.  Time:   Today, I have spent 10 minutes with the patient with telehealth technology discussing medical history, symptoms, and management plan.    Gerldine Koch, NP-C  06/02/2023, 3:42 PM [address], [address] Office [phone] Fax [phone]

## 2023-06-02 NOTE — Telephone Encounter (Signed)
 I will forward this to the preop APP to review if the pt has been cleared. Clearance request 01/02/23, pt tele appt 01/16/23. I have read the tele preop notes which reflect that the pt has been cleared and recommendations for Plavix  as well.

## 2023-06-02 NOTE — Telephone Encounter (Signed)
 Caller Catering manager) called to follow-up on patient's clearance.  Caller noted patient has already stopped his Plavix  and wants clearance faxed to [phone].

## 2023-06-02 NOTE — Telephone Encounter (Signed)
 I s/w the pt's wife (DPR) per preop APP Slater Duncan, NP to add pt on today 10:20 or 3:40. Pt's wife states the pt cannot do 10:20 but took the 3:40 add on. Med rec and consent are done.   See previous notes.

## 2023-06-02 NOTE — Telephone Encounter (Signed)
   Pre-operative Risk Assessment    Patient Name: Willie Clark  DOB: [birth year] MRN: [id number]   Date of last office visit: 04/01/23 Date of next office visit: n/a   Request for Surgical Clearance    Procedure:  Right C7-T1  Date of Surgery:  Clearance 06/02/23                                Surgeon:  Dr. Katheryn Pandy Surgeon's Group or Practice Name:  [facility] Phone number:  [phone] Fax number:  [phone]   Type of Clearance Requested:  Plavix  hold 7days prior, resume day after    Type of Anesthesia:  Not Indicated   Additional requests/questions:    Signed, Fletcher Humble   06/02/2023, 8:40 AM

## 2023-06-02 NOTE — Telephone Encounter (Signed)
 Due to clearance completed > 2 months ago, we will need to schedule a call with him today. He can be added to 10:20 or 3:40.   Thank you, Moira Andrews

## 2023-06-22 IMAGING — MR MR PELVIS WO/W CM
8 of 10 series · 35 of 48 positions shown · IV contrast (gadavist)
Comparison: CT on 12/27/2020

CLINICAL DATA: Palpable tender lump in right inguinal region for 1
year. Previous bilateral inguinal hernia repairs.

EXAM:
MRI PELVIS WITHOUT AND WITH CONTRAST
TECHNIQUE: Multiplanar multisequence MR imaging of the pelvis was performed
both before and after administration of intravenous contrast.
CONTRAST:  9mL GADAVIST GADOBUTROL 1 MMOL/ML IV SOLN

[Series 2: T2 · coronal · 5.0mm · 1.48mm/px · 4 of 38 slices shown (1 of 2)]
[im 1/38]
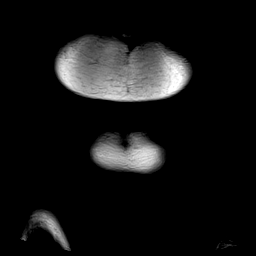
[im 13/38]
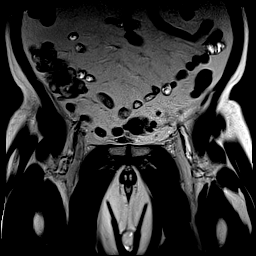
[im 25/38]
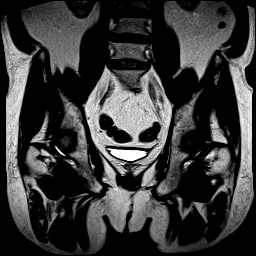
[im 38/38]
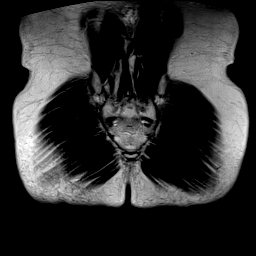

[Series 3: T2 · axial · 5.0mm · 0.88mm/px · z∈[-82,+152]mm · 4 of 40 slices shown (2 of 2)]
[im 1/40]
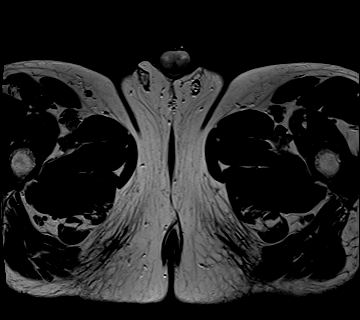
[im 14/40]
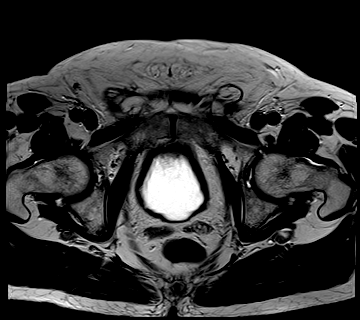
[im 27/40]
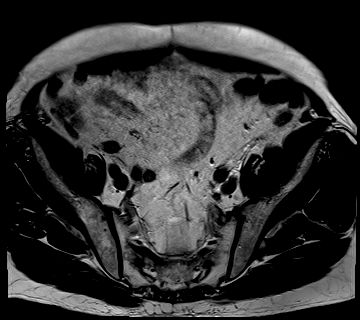
[im 40/40]
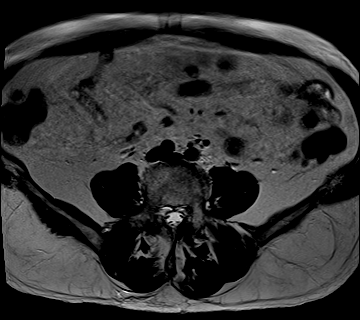

[Series 4: T2 fat-sat · axial · 5.0mm · 0.97mm/px · z∈[-82,+152]mm · 3 of 40 slices shown]
[im 1/40]
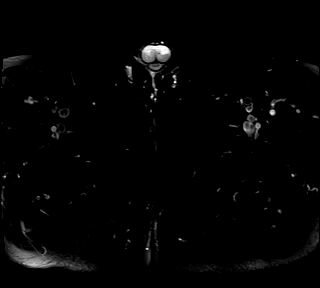
[im 20/40]
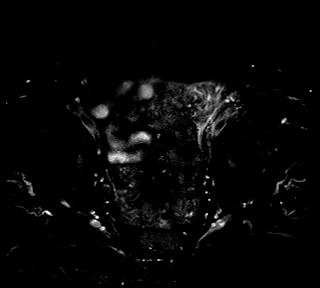
[im 40/40]
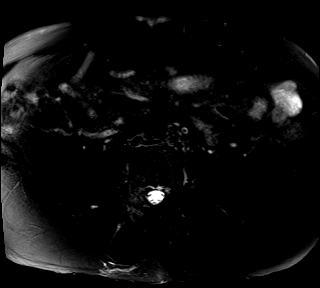

[Series 5: sag tse · sagittal · 5.0mm · 0.88mm/px · 3 of 40 slices shown]
[im 1/40]
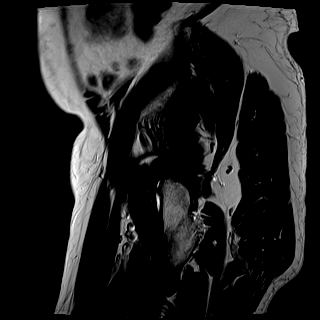
[im 20/40]
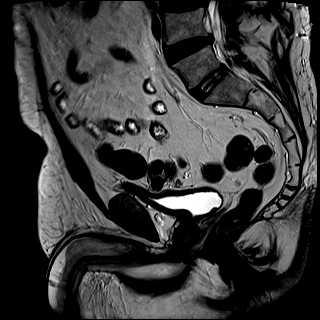
[im 40/40]
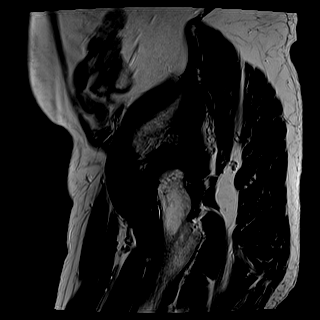

[Series 6: DIXON · axial · 3.0mm · 1.19mm/px · z∈[-84,+153]mm · 6 of 80 slices shown (1 of 2)]
[im 1/80]
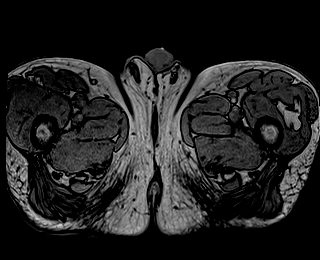
[im 16/80]
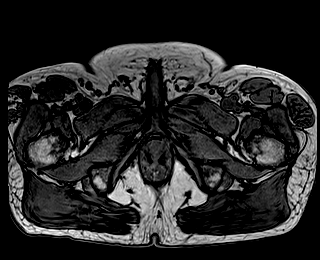
[im 32/80]
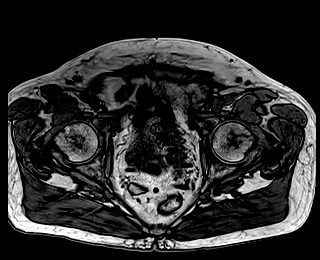
[im 48/80]
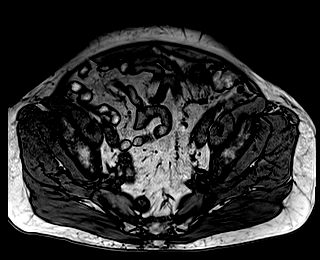
[im 64/80]
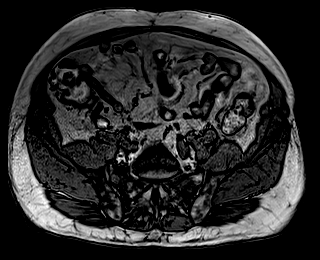
[im 80/80]
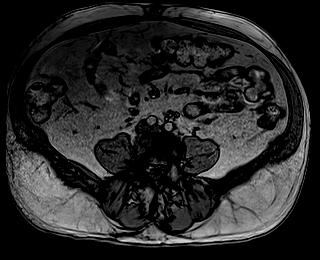

[Series 7: DIXON · axial · 3.0mm · 1.19mm/px · z∈[-84,+153]mm · 6 of 80 slices shown (2 of 2)]
[im 1/80]
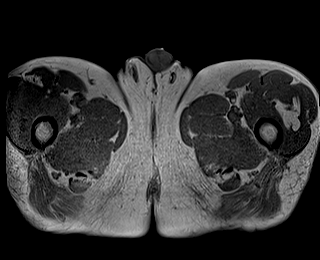
[im 16/80]
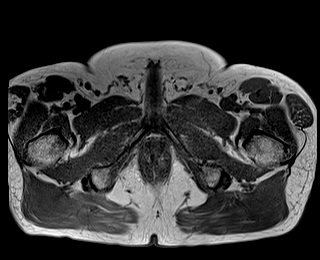
[im 32/80]
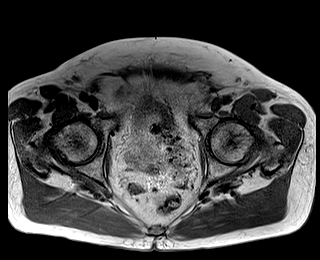
[im 48/80]
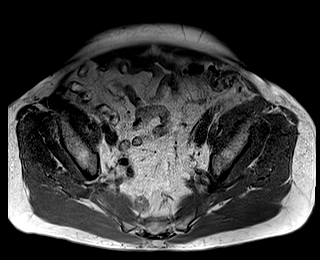
[im 64/80]
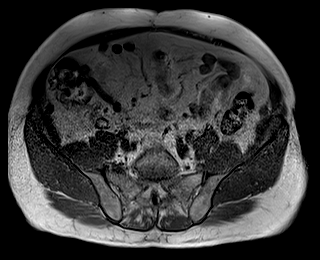
[im 80/80]
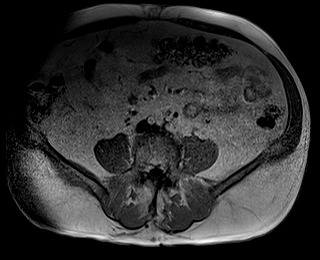

[Series 9: T1 dynamic fat-sat · axial · 3.0mm · 0.59mm/px · z∈[-84,+153]mm · 6 of 80 slices shown]
[im 1/80]
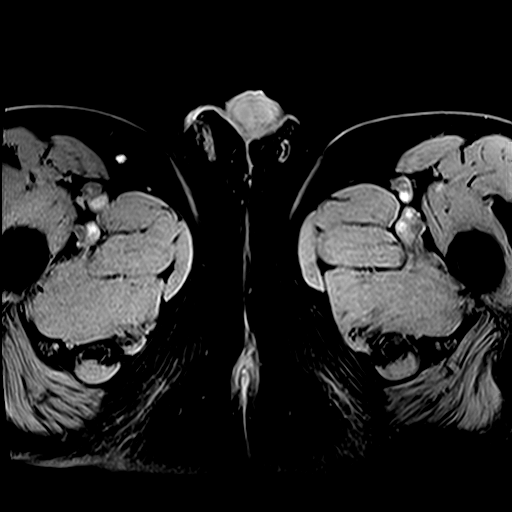
[im 16/80]
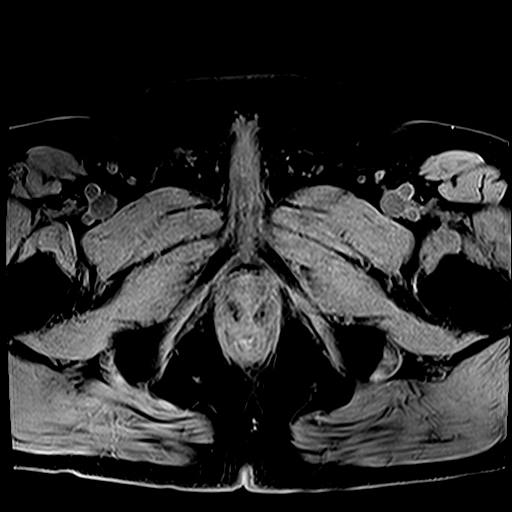
[im 32/80]
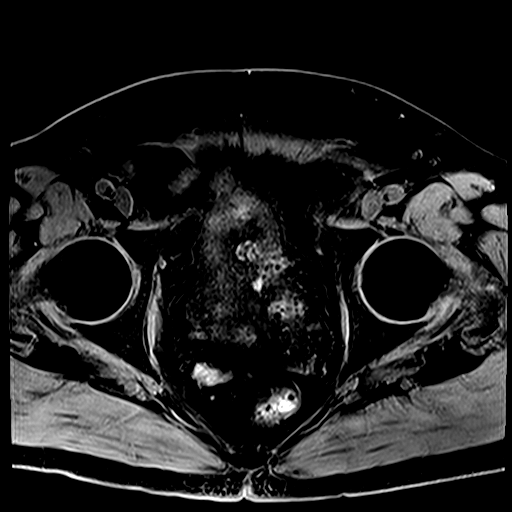
[im 48/80]
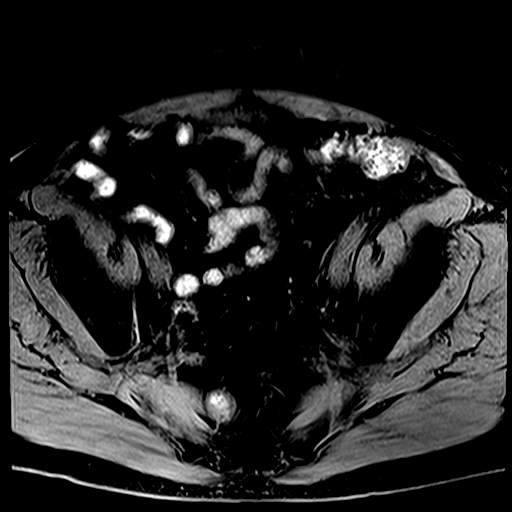
[im 64/80]
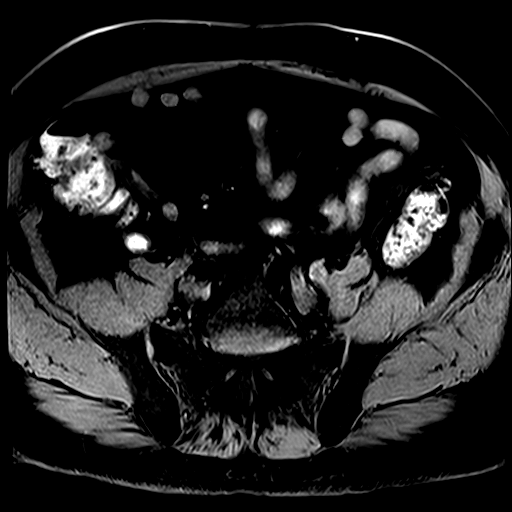
[im 80/80]
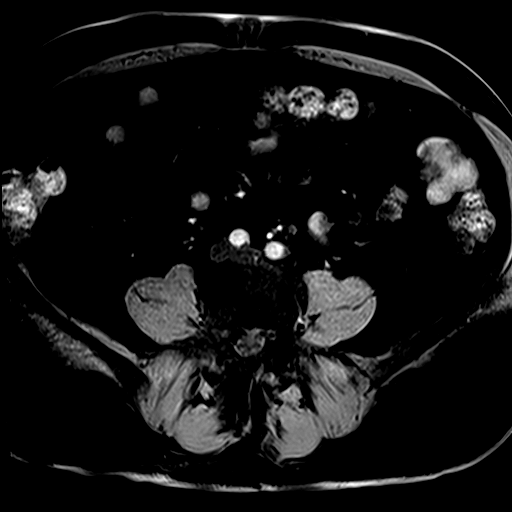

[Series 11: T1 dynamic · axial · 3.0mm · 0.59mm/px · z∈[-84,+9]mm · 3 of 80 slices shown]
[im 1/80]
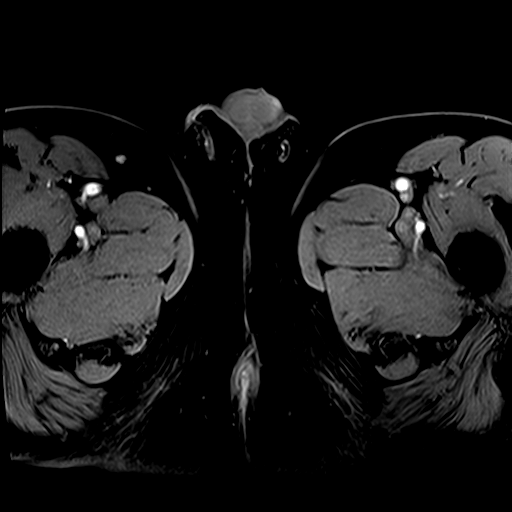
[im 16/80]
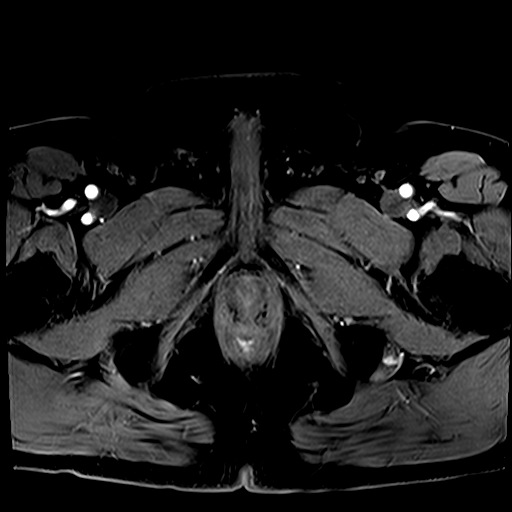
[im 32/80]
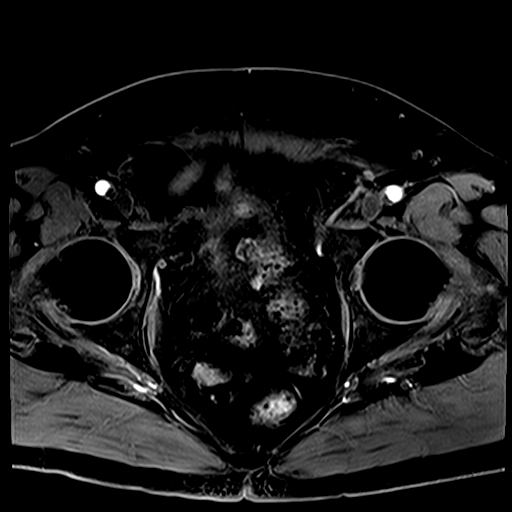

[35 of 48 positions shown; findings below may reference images not displayed]

FINDINGS: Lower Urinary Tract: Unremarkable urinary bladder.

Bowel: Diverticulosis is seen involving the descending sigmoid
colon, with mild wall thickening and pericolonic soft tissue
stranding near the junction of the descending and sigmoid colon,
consistent with mild diverticulitis. No evidence of abscess.

Vascular/Lymphatic: No pathologically enlarged lymph nodes or other
significant abnormality.

Reproductive:  No mass or other significant abnormality.

Other: No evidence of recurrent inguinal hernia, mass, or
lymphadenopathy.

Musculoskeletal: No suspicious bone lesions identified.
IMPRESSION: No evidence of recurrent inguinal hernia, mass, or lymphadenopathy.

Mild diverticulitis the junction of the descending and sigmoid
colon. No evidence of abscess or other complication.

## 2023-07-15 ENCOUNTER — Ambulatory Visit: Payer: Medicare HMO | Admitting: Dermatology

## 2023-07-15 ENCOUNTER — Encounter: Payer: Self-pay | Admitting: Dermatology

## 2023-07-15 DIAGNOSIS — L821 Other seborrheic keratosis: Secondary | ICD-10-CM | POA: Diagnosis not present

## 2023-07-15 DIAGNOSIS — W908XXA Exposure to other nonionizing radiation, initial encounter: Secondary | ICD-10-CM

## 2023-07-15 DIAGNOSIS — D692 Other nonthrombocytopenic purpura: Secondary | ICD-10-CM

## 2023-07-15 DIAGNOSIS — D492 Neoplasm of unspecified behavior of bone, soft tissue, and skin: Secondary | ICD-10-CM

## 2023-07-15 DIAGNOSIS — L578 Other skin changes due to chronic exposure to nonionizing radiation: Secondary | ICD-10-CM | POA: Diagnosis not present

## 2023-07-15 DIAGNOSIS — B079 Viral wart, unspecified: Secondary | ICD-10-CM

## 2023-07-15 DIAGNOSIS — L820 Inflamed seborrheic keratosis: Secondary | ICD-10-CM | POA: Diagnosis not present

## 2023-07-15 NOTE — Progress Notes (Unsigned)
 Follow-Up Visit   Subjective  Willie Clark is a 73 y.o. male who presents for the following: Spots. ISKs. Areas on back, arms, chest and lower legs itching today.   Recheck elbows. Dx LSC. Has been using Clobetasol cream twice a day as directed. Does not think is helping.   The patient has spots, moles and lesions to be evaluated, some may be new or changing and the patient may have concern these could be cancer.  This patient is accompanied in the office by his  wife .    The following portions of the chart were reviewed this encounter and updated as appropriate: medications, allergies, medical history  Review of Systems:  No other skin or systemic complaints except as noted in HPI or Assessment and Plan.  Objective  Well appearing patient in no apparent distress; mood and affect are within normal limits.  A focused examination was performed of the following areas: Torso, face, chest, arms, legs  Relevant physical exam findings are noted in the Assessment and Plan.  Right Elbow - Posterior 1.0 + 0.7 cm adjacent firm flesh papules   Left Lower Leg x5, L knee x1, R forearm x7, upper back x17 (30) Erythematous keratotic or waxy stuck-on papule or plaque.  Assessment & Plan   Purpura - Chronic; persistent and recurrent.  Treatable, but not curable. - Violaceous macules and patches at hands and arms.  - Benign - Related to trauma, age, sun damage and/or use of blood thinners, chronic use of topical and/or oral steroids - Observe - Can use OTC arnica containing moisturizer such as Dermend Bruise Formula if desired - Call for worsening or other concerns  SEBORRHEIC KERATOSIS - Stuck-on, waxy, tan-brown papules and/or plaques  - Benign-appearing - Discussed benign etiology and prognosis. - Observe - Call for any changes  ACTINIC DAMAGE - chronic, secondary to cumulative UV radiation exposure/sun exposure over time - diffuse scaly erythematous macules with underlying  dyspigmentation - Recommend daily broad spectrum sunscreen SPF 30+ to sun-exposed areas, reapply every 2 hours as needed.  - Recommend staying in the shade or wearing long sleeves, sun glasses (UVA+UVB protection) and wide brim hats (4-inch brim around the entire circumference of the hat). - Call for new or changing lesions.   SEBORRHEIC KERATOSIS   NEOPLASM OF SKIN Right Elbow - Posterior Epidermal / dermal shaving  Lesion diameter (cm):  1.7 Informed consent: discussed and consent obtained   Patient was prepped and draped in usual sterile fashion: Area prepped with alcohol. Anesthesia: the lesion was anesthetized in a standard fashion   Anesthetic:  1% lidocaine w/ epinephrine 1-100,000 buffered w/ 8.4% NaHCO3 Instrument used: flexible razor blade   Hemostasis achieved with: pressure, aluminum chloride and electrodesiccation   Outcome: patient tolerated procedure well   Post-procedure details: wound care instructions given   Post-procedure details comment:  Ointment and small bandage applied Specimen 1 - Surgical pathology Differential Diagnosis: ISK vs prurigo nodule vs other  Check Margins: No  2 specimens in bottle  INFLAMED SEBORRHEIC KERATOSIS (30) Left Lower Leg x5, L knee x1, R forearm x7, upper back x17 (30) Symptomatic, irritating, patient would like treated. Destruction of lesion - Left Lower Leg x5, L knee x1, R forearm x7, upper back x17 (30)  Destruction method: cryotherapy   Informed consent: discussed and consent obtained   Lesion destroyed using liquid nitrogen: Yes   Region frozen until ice ball extended beyond lesion: Yes   Outcome: patient tolerated procedure well with no  complications   Post-procedure details: wound care instructions given   Additional details:  Prior to procedure, discussed risks of blister formation, small wound, skin dyspigmentation, or rare scar following cryotherapy. Recommend Vaseline ointment to treated areas while healing.   SENILE PURPURA (HCC)   ACTINIC SKIN DAMAGE     Return in about 3 months (around 10/15/2023) for ISK Follow Up.  I, Lawson Radar, CMA, am acting as scribe for Willeen Niece, MD.   Documentation: I have reviewed the above documentation for accuracy and completeness, and I agree with the above.  Willeen Niece, MD

## 2023-07-15 NOTE — Patient Instructions (Addendum)
 Cryotherapy Aftercare  Wash gently with soap and water everyday.   Apply Vaseline Jelly daily until healed.      Wound Care Instructions  Cleanse wound gently with soap and water once a day then pat dry with clean gauze. Apply a thin coat of Petrolatum (petroleum jelly, "Vaseline") over the wound (unless you have an allergy to this). We recommend that you use a new, sterile tube of Vaseline. Do not pick or remove scabs. Do not remove the yellow or white "healing tissue" from the base of the wound.  Cover the wound with fresh, clean, nonstick gauze and secure with paper tape. You may use Band-Aids in place of gauze and tape if the wound is small enough, but would recommend trimming much of the tape off as there is often too much. Sometimes Band-Aids can irritate the skin.  You should call the office for your biopsy report after 1 week if you have not already been contacted.  If you experience any problems, such as abnormal amounts of bleeding, swelling, significant bruising, significant pain, or evidence of infection, please call the office immediately.  FOR ADULT SURGERY PATIENTS: If you need something for pain relief you may take 1 extra strength Tylenol (acetaminophen) AND 2 Ibuprofen (200mg  each) together every 4 hours as needed for pain. (do not take these if you are allergic to them or if you have a reason you should not take them.) Typically, you may only need pain medication for 1 to 3 days.    Stop Clobetasol cream to elbows.    Recommend starting moisturizer with exfoliant (Urea, Salicylic acid, or Lactic acid) one to two times daily to help smooth rough and bumpy skin.  OTC options include Cetaphil Rough and Bumpy lotion (Urea), Eucerin Roughness Relief lotion or spot treatment cream (Urea), CeraVe SA lotion/cream for Rough and Bumpy skin (Sal Acid), Gold Bond Rough and Bumpy cream (Sal Acid), and AmLactin 12% lotion/cream (Lactic Acid).  If applying in morning, also apply sunscreen  to sun-exposed areas, since these exfoliating moisturizers can increase sensitivity to sun.    Recommend daily broad spectrum sunscreen SPF 30+ to sun-exposed areas, reapply every 2 hours as needed. Call for new or changing lesions.  Staying in the shade or wearing long sleeves, sun glasses (UVA+UVB protection) and wide brim hats (4-inch brim around the entire circumference of the hat) are also recommended for sun protection.      Due to recent changes in healthcare laws, you may see results of your pathology and/or laboratory studies on MyChart before the doctors have had a chance to review them. We understand that in some cases there may be results that are confusing or concerning to you. Please understand that not all results are received at the same time and often the doctors may need to interpret multiple results in order to provide you with the best plan of care or course of treatment. Therefore, we ask that you please give [place] 2 business days to thoroughly review all your results before contacting the office for clarification. Should we see a critical lab result, you will be contacted sooner.   If You Need Anything After Your Visit  If you have any questions or concerns for your doctor, please call our main line at [phone] and press option 4 to reach your doctor's medical assistant. If no one answers, please leave a voicemail as directed and we will return your call as soon as possible. Messages left after 4 pm will be answered  the following business day.   You may also send [place] a message via MyChart. We typically respond to MyChart messages within 1-2 business days.  For prescription refills, please ask your pharmacy to contact our office. Our fax number is [phone].  If you have an urgent issue when the clinic is closed that cannot wait until the next business day, you can page your doctor at the number below.    Please note that while we do our best to be available for urgent  issues outside of office hours, we are not available 24/7.   If you have an urgent issue and are unable to reach [place], you may choose to seek medical care at your doctor's office, retail clinic, urgent care center, or emergency room.  If you have a medical emergency, please immediately call 911 or go to the emergency department.  Pager Numbers  - Dr. Gwen Pounds: [phone]  - Dr. Roseanne Reno: [phone]  - Dr. Katrinka Blazing: [phone]   In the event of inclement weather, please call our main line at [phone] for an update on the status of any delays or closures.  Dermatology Medication Tips: Please keep the boxes that topical medications come in in order to help keep track of the instructions about where and how to use these. Pharmacies typically print the medication instructions only on the boxes and not directly on the medication tubes.   If your medication is too expensive, please contact our office at [phone] option 4 or send [place] a message through MyChart.   We are unable to tell what your co-pay for medications will be in advance as this is different depending on your insurance coverage. However, we may be able to find a substitute medication at lower cost or fill out paperwork to get insurance to cover a needed medication.   If a prior authorization is required to get your medication covered by your insurance company, please allow [place] 1-2 business days to complete this process.  Drug prices often vary depending on where the prescription is filled and some pharmacies may offer cheaper prices.  The website www.goodrx.com contains coupons for medications through different pharmacies. The prices here do not account for what the cost may be with help from insurance (it may be cheaper with your insurance), but the website can give you the price if you did not use any insurance.  - You can print the associated coupon and take it with your prescription to the pharmacy.  - You may also stop  by our office during regular business hours and pick up a GoodRx coupon card.  - If you need your prescription sent electronically to a different pharmacy, notify our office through [facility] or by phone at [phone] option 4.     Si Usted Necesita Algo Despus de Su Visita  Tambin puede enviarnos un mensaje a travs de Clinical cytogeneticist. Por lo general respondemos a los mensajes de MyChart en el transcurso de 1 a 2 das hbiles.  Para renovar recetas, por favor pida a su farmacia que se ponga en contacto con nuestra oficina. Annie Sable de fax es [place] [phone].  Si tiene un asunto urgente cuando la clnica est cerrada y que no puede esperar hasta el siguiente da hbil, puede llamar/localizar a su doctor(a) al nmero que aparece a continuacin.   Por favor, tenga en cuenta que aunque hacemos todo lo posible para estar disponibles para asuntos urgentes fuera del horario de oficina, no estamos disponibles las 24 horas del da,  los 7 das de la [place].   Si tiene un problema urgente y no puede comunicarse con nosotros, puede optar por buscar atencin mdica  en el consultorio de su doctor(a), en una clnica privada, en un centro de atencin urgente o en una sala de emergencias.  Si tiene Engineer, drilling, por favor llame inmediatamente al 911 o vaya a la sala de emergencias.  Nmeros de bper  - Dr. Gwen Pounds: [phone]  - Dra. Roseanne Reno: [phone]  - Dr. Katrinka Blazing: [phone]   En caso de inclemencias del tiempo, por favor llame a Lacy Duverney principal al [phone] para una actualizacin sobre el [place] de cualquier retraso o cierre.  Consejos para la medicacin en dermatologa: Por favor, guarde las cajas en las que vienen los medicamentos de uso tpico para ayudarle a seguir las instrucciones sobre dnde y cmo usarlos. Las farmacias generalmente imprimen las instrucciones del medicamento slo en las cajas y no directamente en los tubos del [place].   Si su  medicamento es muy caro, por favor, pngase en contacto con Rolm Gala llamando al [phone] y presione la opcin 4 o envenos un mensaje a travs de Clinical cytogeneticist.   No podemos decirle cul ser su copago por los medicamentos por adelantado ya que esto es diferente dependiendo de la cobertura de su seguro. Sin embargo, es posible que podamos encontrar un medicamento sustituto a Audiological scientist un formulario para que el seguro cubra el medicamento que se considera necesario.   Si se requiere una autorizacin previa para que su compaa de seguros [place] su medicamento, por favor permtanos de 1 a 2 das hbiles para completar [address]  Los precios de los medicamentos varan con frecuencia dependiendo del Environmental consultant de dnde se surte la receta y alguna farmacias pueden ofrecer precios ms baratos.  El sitio web www.goodrx.com tiene cupones para medicamentos de Health and safety inspector. Los precios aqu no tienen en cuenta lo que podra costar con la ayuda del seguro (puede ser ms barato con su seguro), pero el sitio web puede darle el precio si no utiliz Tourist information centre manager.  - Puede imprimir el cupn correspondiente y llevarlo con su receta a la farmacia.  - Tambin puede pasar por nuestra oficina durante el horario de atencin regular y Education officer, museum una tarjeta de cupones de GoodRx.  - Si necesita que su receta se enve electrnicamente a una farmacia diferente, informe a nuestra oficina a travs de MyChart de [place] o por telfono llamando al [phone] y presione la opcin 4.

## 2023-07-16 LAB — SURGICAL PATHOLOGY

## 2023-07-17 ENCOUNTER — Telehealth: Payer: Self-pay

## 2023-07-17 NOTE — Telephone Encounter (Signed)
 Left pt msg to call for bx results/sh

## 2023-07-17 NOTE — Telephone Encounter (Signed)
 Patient advised of BX results. aw

## 2023-07-17 NOTE — Telephone Encounter (Signed)
-----   Message from Willeen Niece sent at 07/17/2023  1:30 PM EDT ----- 1. Skin, right elbow - posterior :       VERRUCA VULGARIS, IRRITATED   Benign warts, will treat with cryotherapy if they recur - please call patient

## 2023-08-05 ENCOUNTER — Other Ambulatory Visit: Payer: Self-pay

## 2023-08-06 MED ORDER — EMPAGLIFLOZIN 10 MG PO TABS: 10.0000 mg | ORAL_TABLET | Freq: Every day | ORAL | 2 refills | Status: DC

## 2023-08-21 ENCOUNTER — Telehealth: Payer: Self-pay | Admitting: Cardiovascular Disease

## 2023-08-21 DIAGNOSIS — I10 Essential (primary) hypertension: Secondary | ICD-10-CM

## 2023-08-21 DIAGNOSIS — I5022 Chronic systolic (congestive) heart failure: Secondary | ICD-10-CM

## 2023-08-21 NOTE — Telephone Encounter (Signed)
*  STAT* If patient is at the pharmacy, call can be transferred to refill team.   1. Which medications need to be refilled? (please list name of each medication and dose if known)   carvedilol  (COREG ) 3.125 MG tablet  clopidogrel  (PLAVIX ) 75 MG tablet  empagliflozin  (JARDIANCE ) 10 MG TABS tablet  furosemide  (LASIX ) 40 MG tablet  isosorbide  mononitrate (IMDUR ) 60 MG 24 hr tablet  pantoprazole  (PROTONIX ) 40 MG tablet  potassium chloride  SA (KLOR-CON  M) 20 MEQ tablet  rosuvastatin  (CRESTOR ) 40 MG tablet  spironolactone  (ALDACTONE ) 25 MG tablet  torsemide  (DEMADEX ) 20 MG tablet   2. Would you like to learn more about the convenience, safety, & potential cost savings by using the [facility]?   3. Are you open to using the [facility] (Type [facility]. ).  4. Which pharmacy/location (including street and city if local pharmacy) is medication to be sent to?  [facility] - [place], [place] - [address]   5. Do they need a 30 day or 90 day supply?   90 day  Patient stated he is out of these medications.

## 2023-08-22 MED ORDER — CLOPIDOGREL BISULFATE 75 MG PO TABS: ORAL_TABLET | ORAL | 0 refills | Status: DC

## 2023-08-22 MED ORDER — POTASSIUM CHLORIDE CRYS ER 20 MEQ PO TBCR: 20.0000 meq | EXTENDED_RELEASE_TABLET | Freq: Every day | ORAL | 0 refills | Status: AC

## 2023-08-22 MED ORDER — CARVEDILOL 3.125 MG PO TABS: 3.1250 mg | ORAL_TABLET | Freq: Two times a day (BID) | ORAL | 1 refills | Status: DC

## 2023-08-22 MED ORDER — TORSEMIDE 20 MG PO TABS: 40.0000 mg | ORAL_TABLET | Freq: Every day | ORAL | 0 refills | Status: DC

## 2023-08-22 MED ORDER — SPIRONOLACTONE 25 MG PO TABS: 25.0000 mg | ORAL_TABLET | Freq: Every day | ORAL | 0 refills | Status: DC

## 2023-08-22 MED ORDER — ROSUVASTATIN CALCIUM 40 MG PO TABS: 40.0000 mg | ORAL_TABLET | Freq: Every day | ORAL | 0 refills | Status: AC

## 2023-08-22 MED ORDER — ISOSORBIDE MONONITRATE ER 60 MG PO TB24: 60.0000 mg | ORAL_TABLET | Freq: Every day | ORAL | 0 refills | Status: DC

## 2023-08-22 NOTE — Telephone Encounter (Signed)
 Returned the call to the patient. He stated that he was not taking furosemide . This has been taken off of his medication list.   He will call his PCP to refill the Protonix .

## 2023-08-22 NOTE — Telephone Encounter (Signed)
 Please advise if ok to refill historical medications(Carvedilol , Pantoprazole ,). Also, the patient is requesting Lasix  which is also a historical medication. I see that the patient is taking torsemide . I didn't see Dr. Alvenia Aus mentioning lasix  in his last office note. Thank you so much.

## 2023-09-18 ENCOUNTER — Telehealth: Payer: Self-pay | Admitting: Cardiovascular Disease

## 2023-09-18 NOTE — Telephone Encounter (Signed)
 Spoke with a nurse from [facility] who called to inquire whether the patient's sternum wires are MRI compatible. 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  Will forward to Dr. Alvenia Aus for recommendations.

## 2023-09-18 NOTE — Telephone Encounter (Signed)
 Caller Ivette Marks) stated patient is due to have a procedure and has had a CABG.  Caller wants to confirm if patient's device is compatible for an MRI.  Call stated can also reach her at [phone].

## 2023-09-19 NOTE — Telephone Encounter (Signed)
 Sternal wires are MRI compatible.

## 2023-09-19 NOTE — Telephone Encounter (Signed)
 Attempted to call but the office was closed.

## 2023-09-22 ENCOUNTER — Other Ambulatory Visit: Payer: Self-pay

## 2023-09-22 DIAGNOSIS — G43711 Chronic migraine without aura, intractable, with status migrainosus: Secondary | ICD-10-CM

## 2023-09-22 DIAGNOSIS — R519 Headache, unspecified: Secondary | ICD-10-CM

## 2023-09-22 NOTE — Telephone Encounter (Signed)
 Willie Clark has been made aware and verbalized her understanding.

## 2023-09-23 ENCOUNTER — Ambulatory Visit
Admission: RE | Admit: 2023-09-23 | Discharge: 2023-09-23 | Disposition: A | Discharge status: Home / Self Care | Source: Ambulatory Visit

## 2023-09-23 DIAGNOSIS — G43711 Chronic migraine without aura, intractable, with status migrainosus: Secondary | ICD-10-CM | POA: Diagnosis present

## 2023-09-23 DIAGNOSIS — R519 Headache, unspecified: Secondary | ICD-10-CM | POA: Diagnosis present

## 2023-09-23 MED ORDER — GADOBUTROL 1 MMOL/ML IV SOLN
9.0000 mL | Freq: Once | INTRAVENOUS | Status: AC | PRN
Start: 2023-09-23 — End: 2023-09-23
  Administered 2023-09-23: 9 mL via INTRAVENOUS

## 2023-10-13 ENCOUNTER — Emergency Department

## 2023-10-13 ENCOUNTER — Other Ambulatory Visit: Payer: Self-pay

## 2023-10-13 ENCOUNTER — Inpatient Hospital Stay
Admission: EM | Admit: 2023-10-13 | Discharge: 2023-10-17 | DRG: 871 | Disposition: A | Discharge status: Home / Self Care | Attending: Family Medicine | Admitting: Family Medicine

## 2023-10-13 DIAGNOSIS — Z880 Allergy status to penicillin: Secondary | ICD-10-CM

## 2023-10-13 DIAGNOSIS — G9341 Metabolic encephalopathy: Secondary | ICD-10-CM | POA: Diagnosis present

## 2023-10-13 DIAGNOSIS — N3090 Cystitis, unspecified without hematuria: Secondary | ICD-10-CM | POA: Diagnosis present

## 2023-10-13 DIAGNOSIS — J449 Chronic obstructive pulmonary disease, unspecified: Secondary | ICD-10-CM | POA: Diagnosis present

## 2023-10-13 DIAGNOSIS — Z8711 Personal history of peptic ulcer disease: Secondary | ICD-10-CM

## 2023-10-13 DIAGNOSIS — K219 Gastro-esophageal reflux disease without esophagitis: Secondary | ICD-10-CM | POA: Diagnosis present

## 2023-10-13 DIAGNOSIS — I252 Old myocardial infarction: Secondary | ICD-10-CM

## 2023-10-13 DIAGNOSIS — Z9889 Other specified postprocedural states: Secondary | ICD-10-CM

## 2023-10-13 DIAGNOSIS — N529 Male erectile dysfunction, unspecified: Secondary | ICD-10-CM

## 2023-10-13 DIAGNOSIS — Z951 Presence of aortocoronary bypass graft: Secondary | ICD-10-CM

## 2023-10-13 DIAGNOSIS — Z1152 Encounter for screening for COVID-19: Secondary | ICD-10-CM

## 2023-10-13 DIAGNOSIS — E66811 Obesity, class 1: Secondary | ICD-10-CM | POA: Diagnosis present

## 2023-10-13 DIAGNOSIS — F419 Anxiety disorder, unspecified: Secondary | ICD-10-CM | POA: Diagnosis present

## 2023-10-13 DIAGNOSIS — K5792 Diverticulitis of intestine, part unspecified, without perforation or abscess without bleeding: Secondary | ICD-10-CM | POA: Diagnosis present

## 2023-10-13 DIAGNOSIS — F32A Depression, unspecified: Secondary | ICD-10-CM | POA: Diagnosis present

## 2023-10-13 DIAGNOSIS — Z86711 Personal history of pulmonary embolism: Secondary | ICD-10-CM

## 2023-10-13 DIAGNOSIS — E1151 Type 2 diabetes mellitus with diabetic peripheral angiopathy without gangrene: Secondary | ICD-10-CM | POA: Diagnosis present

## 2023-10-13 DIAGNOSIS — Z803 Family history of malignant neoplasm of breast: Secondary | ICD-10-CM

## 2023-10-13 DIAGNOSIS — K5730 Diverticulosis of large intestine without perforation or abscess without bleeding: Secondary | ICD-10-CM | POA: Diagnosis present

## 2023-10-13 DIAGNOSIS — N12 Tubulo-interstitial nephritis, not specified as acute or chronic: Secondary | ICD-10-CM | POA: Diagnosis not present

## 2023-10-13 DIAGNOSIS — E872 Acidosis, unspecified: Secondary | ICD-10-CM | POA: Diagnosis present

## 2023-10-13 DIAGNOSIS — Z8249 Family history of ischemic heart disease and other diseases of the circulatory system: Secondary | ICD-10-CM

## 2023-10-13 DIAGNOSIS — I110 Hypertensive heart disease with heart failure: Secondary | ICD-10-CM | POA: Diagnosis present

## 2023-10-13 DIAGNOSIS — Z79899 Other long term (current) drug therapy: Secondary | ICD-10-CM

## 2023-10-13 DIAGNOSIS — A4151 Sepsis due to Escherichia coli [E. coli]: Secondary | ICD-10-CM | POA: Diagnosis not present

## 2023-10-13 DIAGNOSIS — Z87891 Personal history of nicotine dependence: Secondary | ICD-10-CM

## 2023-10-13 DIAGNOSIS — Z7902 Long term (current) use of antithrombotics/antiplatelets: Secondary | ICD-10-CM

## 2023-10-13 DIAGNOSIS — R4182 Altered mental status, unspecified: Secondary | ICD-10-CM

## 2023-10-13 DIAGNOSIS — Z6830 Body mass index (BMI) 30.0-30.9, adult: Secondary | ICD-10-CM

## 2023-10-13 DIAGNOSIS — Z882 Allergy status to sulfonamides status: Secondary | ICD-10-CM

## 2023-10-13 DIAGNOSIS — Z955 Presence of coronary angioplasty implant and graft: Secondary | ICD-10-CM

## 2023-10-13 DIAGNOSIS — R6520 Severe sepsis without septic shock: Secondary | ICD-10-CM | POA: Diagnosis present

## 2023-10-13 DIAGNOSIS — I714 Abdominal aortic aneurysm, without rupture, unspecified: Secondary | ICD-10-CM | POA: Diagnosis present

## 2023-10-13 DIAGNOSIS — Z9109 Other allergy status, other than to drugs and biological substances: Secondary | ICD-10-CM

## 2023-10-13 DIAGNOSIS — Z7982 Long term (current) use of aspirin: Secondary | ICD-10-CM

## 2023-10-13 DIAGNOSIS — Z881 Allergy status to other antibiotic agents status: Secondary | ICD-10-CM

## 2023-10-13 DIAGNOSIS — G4733 Obstructive sleep apnea (adult) (pediatric): Secondary | ICD-10-CM | POA: Diagnosis present

## 2023-10-13 DIAGNOSIS — Z87440 Personal history of urinary (tract) infections: Secondary | ICD-10-CM

## 2023-10-13 DIAGNOSIS — M797 Fibromyalgia: Secondary | ICD-10-CM | POA: Diagnosis present

## 2023-10-13 DIAGNOSIS — Z888 Allergy status to other drugs, medicaments and biological substances status: Secondary | ICD-10-CM

## 2023-10-13 DIAGNOSIS — N200 Calculus of kidney: Secondary | ICD-10-CM | POA: Diagnosis present

## 2023-10-13 DIAGNOSIS — G2581 Restless legs syndrome: Secondary | ICD-10-CM | POA: Diagnosis present

## 2023-10-13 DIAGNOSIS — N400 Enlarged prostate without lower urinary tract symptoms: Secondary | ICD-10-CM | POA: Diagnosis present

## 2023-10-13 DIAGNOSIS — G43009 Migraine without aura, not intractable, without status migrainosus: Secondary | ICD-10-CM | POA: Diagnosis present

## 2023-10-13 DIAGNOSIS — A419 Sepsis, unspecified organism: Principal | ICD-10-CM | POA: Diagnosis present

## 2023-10-13 DIAGNOSIS — I2510 Atherosclerotic heart disease of native coronary artery without angina pectoris: Secondary | ICD-10-CM | POA: Diagnosis present

## 2023-10-13 DIAGNOSIS — Z8673 Personal history of transient ischemic attack (TIA), and cerebral infarction without residual deficits: Secondary | ICD-10-CM

## 2023-10-13 DIAGNOSIS — Z981 Arthrodesis status: Secondary | ICD-10-CM

## 2023-10-13 DIAGNOSIS — N10 Acute tubulo-interstitial nephritis: Secondary | ICD-10-CM | POA: Diagnosis present

## 2023-10-13 DIAGNOSIS — I5042 Chronic combined systolic (congestive) and diastolic (congestive) heart failure: Secondary | ICD-10-CM | POA: Diagnosis present

## 2023-10-13 DIAGNOSIS — Z9049 Acquired absence of other specified parts of digestive tract: Secondary | ICD-10-CM

## 2023-10-13 DIAGNOSIS — K2270 Barrett's esophagus without dysplasia: Secondary | ICD-10-CM | POA: Diagnosis present

## 2023-10-13 DIAGNOSIS — E785 Hyperlipidemia, unspecified: Secondary | ICD-10-CM | POA: Diagnosis present

## 2023-10-13 DIAGNOSIS — I5022 Chronic systolic (congestive) heart failure: Secondary | ICD-10-CM | POA: Diagnosis present

## 2023-10-13 LAB — CBC WITH DIFFERENTIAL/PLATELET
Abs Immature Granulocytes: 0.11 10*3/uL — ABNORMAL HIGH (ref 0.00–0.07)
Basophils Absolute: 0 10*3/uL (ref 0.0–0.1)
Basophils Relative: 0 %
Eosinophils Absolute: 0 10*3/uL (ref 0.0–0.5)
Eosinophils Relative: 0 %
HCT: 37.2 % — ABNORMAL LOW (ref 39.0–52.0)
Hemoglobin: 12.9 g/dL — ABNORMAL LOW (ref 13.0–17.0)
Immature Granulocytes: 1 %
Lymphocytes Relative: 7 %
Lymphs Abs: 0.9 10*3/uL (ref 0.7–4.0)
MCH: 34.3 pg — ABNORMAL HIGH (ref 26.0–34.0)
MCHC: 34.7 g/dL (ref 30.0–36.0)
MCV: 98.9 fL (ref 80.0–100.0)
Monocytes Absolute: 0.6 10*3/uL (ref 0.1–1.0)
Monocytes Relative: 5 %
Neutro Abs: 10 10*3/uL — ABNORMAL HIGH (ref 1.7–7.7)
Neutrophils Relative %: 87 %
Platelets: 203 10*3/uL (ref 150–400)
RBC: 3.76 MIL/uL — ABNORMAL LOW (ref 4.22–5.81)
RDW: 14 % (ref 11.5–15.5)
WBC: 11.6 10*3/uL — ABNORMAL HIGH (ref 4.0–10.5)
nRBC: 0 % (ref 0.0–0.2)

## 2023-10-13 MED ORDER — LACTATED RINGERS IV BOLUS (SEPSIS)
1000.0000 mL | Freq: Once | INTRAVENOUS | Status: AC
  Administered 2023-10-14: 1000 mL via INTRAVENOUS

## 2023-10-13 MED ORDER — ACETAMINOPHEN 500 MG PO TABS
1000.0000 mg | ORAL_TABLET | Freq: Once | ORAL | Status: AC
  Administered 2023-10-13: 1000 mg via ORAL
  Filled 2023-10-13: qty 2

## 2023-10-13 MED ORDER — VANCOMYCIN HCL IN DEXTROSE 1-5 GM/200ML-% IV SOLN
1000.0000 mg | Freq: Once | INTRAVENOUS | Status: AC
  Administered 2023-10-14: 1000 mg via INTRAVENOUS
  Filled 2023-10-13: qty 200

## 2023-10-13 MED ORDER — LACTATED RINGERS IV SOLN
INTRAVENOUS | Status: AC
Start: 2023-10-13 — End: 2023-10-14

## 2023-10-13 MED ORDER — SODIUM CHLORIDE 0.9 % IV SOLN
2.0000 g | Freq: Once | INTRAVENOUS | Status: AC
  Administered 2023-10-13: 2 g via INTRAVENOUS
  Filled 2023-10-13: qty 12.5

## 2023-10-13 MED ORDER — METRONIDAZOLE 500 MG/100ML IV SOLN
500.0000 mg | Freq: Once | INTRAVENOUS | Status: AC
  Administered 2023-10-14: 500 mg via INTRAVENOUS
  Filled 2023-10-13: qty 100

## 2023-10-13 NOTE — Progress Notes (Signed)
 CODE SEPSIS - PHARMACY COMMUNICATION  **Broad Spectrum Antibiotics should be administered within 1 hour of Sepsis diagnosis**  Time Code Sepsis Called/Page Received: 2324  Antibiotics Ordered: Cefepime , Flagyl , Vancomycin   Time of 1st antibiotic administration: 2357  [place] CANDIE Dills, PharmD, [facility] 10/13/2023 11:36 PM

## 2023-10-13 NOTE — ED Triage Notes (Signed)
 Pt to ED via EMS coming from home. Wife reports pt being weak and dizzy. Cardiac hx. EKG tachy. Oral temp 121f. 91 % on room air with EMS. Pt has frequent UTI's per wife. Skin tear on left arm present. GCS 15.

## 2023-10-13 NOTE — ED Provider Notes (Signed)
 [facility] Provider Note    Event Date/Time   First MD Initiated Contact with Patient 10/13/23 2305     (approximate)   History   Weakness   HPI Willie Clark is a 73 y.o. male with history of CAD, OSA, HTN, HLD, HFpEF presenting today for fever.  Patient reports he has not been feeling well for the past couple of days and EMS called by wife for weakness.  He reports having a fever, dry cough, abdominal pain, dysuria, and diarrhea.  Denies chest pain, difficulty breathing, congestion.  Wife reports prior history of UTIs that presented similar to this.  Patient otherwise denies pain symptoms elsewhere.     Physical Exam   Triage Vital Signs: ED Triage Vitals  Encounter Vitals Group     BP      Girls Systolic BP Percentile      Girls Diastolic BP Percentile      Boys Systolic BP Percentile      Boys Diastolic BP Percentile      Pulse      Resp      Temp      Temp src      SpO2      Weight      Height      Head Circumference      Peak Flow      Pain Score      Pain Loc      Pain Education      Exclude from Growth Chart     Most recent vital signs: There were no vitals filed for this visit.  Physical Exam: I have reviewed the vital signs and nursing notes. General: Awake, alert, no acute distress.  Nontoxic appearing. Head:  Atraumatic, normocephalic.   ENT:  EOM intact, PERRL. Oral mucosa is pink and moist with no lesions. Neck: Neck is supple with full range of motion, No meningeal signs. Cardiovascular:  tachycardic, RR, No murmurs. Peripheral pulses palpable and equal bilaterally. Respiratory:  Symmetrical chest wall expansion.  No rhonchi, rales, or wheezes.  Good air movement throughout.  No use of accessory muscles.   Musculoskeletal:  No cyanosis or edema. Moving extremities with full ROM Abdomen:  Soft, tender to palpation right lower quadrant, nondistended. Neuro:  GCS 14 with occasional confusion, oriented to person and place  but not year, moving all four extremities, interacting appropriately. Speech clear. Psych:  Calm, appropriate.   Skin:  Warm, dry, no rash.    ED Results / Procedures / Treatments   Labs (all labs ordered are listed, but only abnormal results are displayed) Labs Reviewed - No data to display   EKG My EKG interpretation: Rate of 109, sinus tachycardia, incomplete left bundle branch block.  No acute ST elevations or depressions   RADIOLOGY ***   PROCEDURES:  Critical Care performed: Yes, see critical care procedure note(s)  Procedures   MEDICATIONS ORDERED IN ED: Medications - No data to display   IMPRESSION / MDM / ASSESSMENT AND PLAN / ED COURSE  I reviewed the triage vital signs and the nursing notes.                              Differential diagnosis includes, but is not limited to, UTI, pneumonia, COVID, flu, RSV, appendicitis, other acute intra-abdominal infection, sepsis  Patient's presentation is most consistent with acute presentation with potential threat to life or bodily function.  Patient  is a 73 year old male presenting today for fever, altered mental status, dysuria, and abdominal pain.  He appears slightly confused on exam and not oriented to year.  He is febrile, tachycardic, and tachypneic and started on sepsis protocol with broad-spectrum antibiotics and blood cultures.  1 L of fluid given initially while pending first lactic acid.***  The patient is on the cardiac monitor to evaluate for evidence of arrhythmia and/or significant heart rate changes.     FINAL CLINICAL IMPRESSION(S) / ED DIAGNOSES   Final diagnoses:  None     Rx / DC Orders   ED Discharge Orders     None        Note:  This document was prepared using Dragon voice recognition software and may include unintentional dictation errors.

## 2023-10-13 NOTE — Sepsis Progress Note (Signed)
 Elink following for sepsis protocol

## 2023-10-14 ENCOUNTER — Encounter: Payer: Self-pay | Admitting: Internal Medicine

## 2023-10-14 ENCOUNTER — Emergency Department

## 2023-10-14 DIAGNOSIS — N10 Acute tubulo-interstitial nephritis: Secondary | ICD-10-CM

## 2023-10-14 DIAGNOSIS — N200 Calculus of kidney: Secondary | ICD-10-CM | POA: Diagnosis not present

## 2023-10-14 DIAGNOSIS — I5042 Chronic combined systolic (congestive) and diastolic (congestive) heart failure: Secondary | ICD-10-CM | POA: Diagnosis present

## 2023-10-14 DIAGNOSIS — E872 Acidosis, unspecified: Secondary | ICD-10-CM | POA: Diagnosis present

## 2023-10-14 DIAGNOSIS — A4151 Sepsis due to Escherichia coli [E. coli]: Secondary | ICD-10-CM | POA: Diagnosis present

## 2023-10-14 DIAGNOSIS — E785 Hyperlipidemia, unspecified: Secondary | ICD-10-CM | POA: Diagnosis present

## 2023-10-14 DIAGNOSIS — Z87891 Personal history of nicotine dependence: Secondary | ICD-10-CM | POA: Diagnosis not present

## 2023-10-14 DIAGNOSIS — R6520 Severe sepsis without septic shock: Secondary | ICD-10-CM | POA: Diagnosis present

## 2023-10-14 DIAGNOSIS — N12 Tubulo-interstitial nephritis, not specified as acute or chronic: Secondary | ICD-10-CM | POA: Diagnosis present

## 2023-10-14 DIAGNOSIS — F32A Depression, unspecified: Secondary | ICD-10-CM | POA: Diagnosis present

## 2023-10-14 DIAGNOSIS — Z9889 Other specified postprocedural states: Secondary | ICD-10-CM

## 2023-10-14 DIAGNOSIS — E1151 Type 2 diabetes mellitus with diabetic peripheral angiopathy without gangrene: Secondary | ICD-10-CM | POA: Diagnosis present

## 2023-10-14 DIAGNOSIS — K5792 Diverticulitis of intestine, part unspecified, without perforation or abscess without bleeding: Secondary | ICD-10-CM | POA: Diagnosis present

## 2023-10-14 DIAGNOSIS — J449 Chronic obstructive pulmonary disease, unspecified: Secondary | ICD-10-CM | POA: Diagnosis present

## 2023-10-14 DIAGNOSIS — G9341 Metabolic encephalopathy: Secondary | ICD-10-CM

## 2023-10-14 DIAGNOSIS — E66811 Obesity, class 1: Secondary | ICD-10-CM | POA: Diagnosis present

## 2023-10-14 DIAGNOSIS — Z1152 Encounter for screening for COVID-19: Secondary | ICD-10-CM | POA: Diagnosis not present

## 2023-10-14 DIAGNOSIS — Z8249 Family history of ischemic heart disease and other diseases of the circulatory system: Secondary | ICD-10-CM | POA: Diagnosis not present

## 2023-10-14 DIAGNOSIS — Z86711 Personal history of pulmonary embolism: Secondary | ICD-10-CM

## 2023-10-14 DIAGNOSIS — I2510 Atherosclerotic heart disease of native coronary artery without angina pectoris: Secondary | ICD-10-CM | POA: Diagnosis present

## 2023-10-14 DIAGNOSIS — Z7902 Long term (current) use of antithrombotics/antiplatelets: Secondary | ICD-10-CM | POA: Diagnosis not present

## 2023-10-14 DIAGNOSIS — M797 Fibromyalgia: Secondary | ICD-10-CM | POA: Diagnosis present

## 2023-10-14 DIAGNOSIS — I110 Hypertensive heart disease with heart failure: Secondary | ICD-10-CM | POA: Diagnosis present

## 2023-10-14 DIAGNOSIS — G2581 Restless legs syndrome: Secondary | ICD-10-CM | POA: Diagnosis present

## 2023-10-14 DIAGNOSIS — J439 Emphysema, unspecified: Secondary | ICD-10-CM | POA: Diagnosis not present

## 2023-10-14 DIAGNOSIS — G4733 Obstructive sleep apnea (adult) (pediatric): Secondary | ICD-10-CM | POA: Diagnosis present

## 2023-10-14 DIAGNOSIS — A419 Sepsis, unspecified organism: Secondary | ICD-10-CM | POA: Diagnosis not present

## 2023-10-14 DIAGNOSIS — I714 Abdominal aortic aneurysm, without rupture, unspecified: Secondary | ICD-10-CM | POA: Diagnosis present

## 2023-10-14 DIAGNOSIS — Z7982 Long term (current) use of aspirin: Secondary | ICD-10-CM | POA: Diagnosis not present

## 2023-10-14 DIAGNOSIS — N400 Enlarged prostate without lower urinary tract symptoms: Secondary | ICD-10-CM | POA: Diagnosis present

## 2023-10-14 LAB — RESP PANEL BY RT-PCR (RSV, FLU A&B, COVID)  RVPGX2
Influenza A by PCR: NEGATIVE
Influenza B by PCR: NEGATIVE
Resp Syncytial Virus by PCR: NEGATIVE
SARS Coronavirus 2 by RT PCR: NEGATIVE

## 2023-10-14 LAB — URINALYSIS, W/ REFLEX TO CULTURE (INFECTION SUSPECTED)
Bilirubin Urine: NEGATIVE
Glucose, UA: NEGATIVE mg/dL
Ketones, ur: NEGATIVE mg/dL
Nitrite: NEGATIVE
Protein, ur: NEGATIVE mg/dL
Specific Gravity, Urine: 1.009 (ref 1.005–1.030)
Squamous Epithelial / HPF: 0 /HPF (ref 0–5)
pH: 7 (ref 5.0–8.0)

## 2023-10-14 LAB — COMPREHENSIVE METABOLIC PANEL WITH GFR
ALT: 37 U/L (ref 0–44)
ALT: 35 U/L (ref 0–44)
AST: 50 U/L — ABNORMAL HIGH (ref 15–41)
AST: 49 U/L — ABNORMAL HIGH (ref 15–41)
Albumin: 3.9 g/dL (ref 3.5–5.0)
Albumin: 3.2 g/dL — ABNORMAL LOW (ref 3.5–5.0)
Alkaline Phosphatase: 79 U/L (ref 38–126)
Alkaline Phosphatase: 69 U/L (ref 38–126)
Anion gap: 14 (ref 5–15)
Anion gap: 12 (ref 5–15)
BUN: 9 mg/dL (ref 8–23)
BUN: 8 mg/dL (ref 8–23)
CO2: 24 mmol/L (ref 22–32)
CO2: 26 mmol/L (ref 22–32)
Calcium: 9.3 mg/dL (ref 8.9–10.3)
Calcium: 8.6 mg/dL — ABNORMAL LOW (ref 8.9–10.3)
Chloride: 97 mmol/L — ABNORMAL LOW (ref 98–111)
Chloride: 100 mmol/L (ref 98–111)
Creatinine, Ser: 0.96 mg/dL (ref 0.61–1.24)
Creatinine, Ser: 1.04 mg/dL (ref 0.61–1.24)
GFR, Estimated: >60 mL/min (ref >60)
GFR, Estimated: >60 mL/min (ref >60)
Glucose, Bld: 186 mg/dL — ABNORMAL HIGH (ref 70–99)
Glucose, Bld: 178 mg/dL — ABNORMAL HIGH (ref 70–99)
Potassium: 3.5 mmol/L (ref 3.5–5.1)
Potassium: 3.5 mmol/L (ref 3.5–5.1)
Sodium: 135 mmol/L (ref 135–145)
Sodium: 138 mmol/L (ref 135–145)
Total Bilirubin: 1.1 mg/dL (ref 0.0–1.2)
Total Bilirubin: 1 mg/dL (ref 0.0–1.2)
Total Protein: 7.1 g/dL (ref 6.5–8.1)
Total Protein: 5.9 g/dL — ABNORMAL LOW (ref 6.5–8.1)

## 2023-10-14 LAB — BLOOD CULTURE ID PANEL (REFLEXED) - BCID2

## 2023-10-14 LAB — LACTIC ACID, PLASMA
Lactic Acid, Venous: 2.3 mmol/L (ref 0.5–1.9)
Lactic Acid, Venous: 7.4 mmol/L (ref 0.5–1.9)
Lactic Acid, Venous: 2.7 mmol/L (ref 0.5–1.9)
Lactic Acid, Venous: 2.1 mmol/L (ref 0.5–1.9)

## 2023-10-14 LAB — CBC
HCT: 35.8 % — ABNORMAL LOW (ref 39.0–52.0)
Hemoglobin: 12 g/dL — ABNORMAL LOW (ref 13.0–17.0)
MCH: 33.7 pg (ref 26.0–34.0)
MCHC: 33.5 g/dL (ref 30.0–36.0)
MCV: 100.6 fL — ABNORMAL HIGH (ref 80.0–100.0)
Platelets: 162 10*3/uL (ref 150–400)
RBC: 3.56 MIL/uL — ABNORMAL LOW (ref 4.22–5.81)
RDW: 14.1 % (ref 11.5–15.5)
WBC: 14.4 10*3/uL — ABNORMAL HIGH (ref 4.0–10.5)
nRBC: 0.1 % (ref 0.0–0.2)

## 2023-10-14 LAB — PROTIME-INR
INR: 1 (ref 0.8–1.2)
Prothrombin Time: 13.3 s (ref 11.4–15.2)

## 2023-10-14 MED ORDER — ASPIRIN 81 MG PO TBEC
81.0000 mg | DELAYED_RELEASE_TABLET | Freq: Every day | ORAL | Status: DC
  Administered 2023-10-14 – 2023-10-17 (×4): 81 mg via ORAL
  Filled 2023-10-14 (×4): qty 1

## 2023-10-14 MED ORDER — ALBUTEROL SULFATE HFA 108 (90 BASE) MCG/ACT IN AERS: 1.0000 | INHALATION_SPRAY | RESPIRATORY_TRACT | Status: DC | PRN

## 2023-10-14 MED ORDER — ONDANSETRON HCL 4 MG/2ML IJ SOLN
4.0000 mg | Freq: Four times a day (QID) | INTRAMUSCULAR | Status: DC | PRN
  Administered 2023-10-15 – 2023-10-17 (×2): 4 mg via INTRAVENOUS
  Filled 2023-10-14 (×2): qty 2

## 2023-10-14 MED ORDER — CITALOPRAM HYDROBROMIDE 20 MG PO TABS
20.0000 mg | ORAL_TABLET | Freq: Every day | ORAL | Status: DC
  Administered 2023-10-14 – 2023-10-17 (×4): 20 mg via ORAL
  Filled 2023-10-14 (×4): qty 1

## 2023-10-14 MED ORDER — LACTATED RINGERS IV BOLUS (SEPSIS)
1000.0000 mL | Freq: Once | INTRAVENOUS | Status: AC
  Administered 2023-10-14: 1000 mL via INTRAVENOUS

## 2023-10-14 MED ORDER — LACTATED RINGERS IV SOLN
150.0000 mL/h | INTRAVENOUS | Status: AC
  Administered 2023-10-14 (×3): 150 mL/h via INTRAVENOUS

## 2023-10-14 MED ORDER — ALBUTEROL SULFATE (2.5 MG/3ML) 0.083% IN NEBU: 2.5000 mg | INHALATION_SOLUTION | RESPIRATORY_TRACT | Status: DC | PRN

## 2023-10-14 MED ORDER — PANTOPRAZOLE SODIUM 40 MG PO TBEC
40.0000 mg | DELAYED_RELEASE_TABLET | Freq: Two times a day (BID) | ORAL | Status: DC
  Administered 2023-10-14 – 2023-10-17 (×7): 40 mg via ORAL
  Filled 2023-10-14 (×7): qty 1

## 2023-10-14 MED ORDER — ACETAMINOPHEN 650 MG RE SUPP: 650.0000 mg | Freq: Four times a day (QID) | RECTAL | Status: DC | PRN

## 2023-10-14 MED ORDER — HYDROCODONE-ACETAMINOPHEN 5-325 MG PO TABS
1.0000 | ORAL_TABLET | ORAL | Status: DC | PRN
  Administered 2023-10-15 (×2): 1 via ORAL
  Administered 2023-10-16 – 2023-10-17 (×3): 2 via ORAL
  Filled 2023-10-14 (×2): qty 2
  Filled 2023-10-14 (×2): qty 1
  Filled 2023-10-14: qty 2

## 2023-10-14 MED ORDER — ROSUVASTATIN CALCIUM 20 MG PO TABS
40.0000 mg | ORAL_TABLET | Freq: Every day | ORAL | Status: DC
  Administered 2023-10-14 – 2023-10-17 (×4): 40 mg via ORAL
  Filled 2023-10-14 (×4): qty 2

## 2023-10-14 MED ORDER — ONDANSETRON HCL 4 MG PO TABS
4.0000 mg | ORAL_TABLET | Freq: Four times a day (QID) | ORAL | Status: DC | PRN
Start: 2023-10-14 — End: 2023-10-17

## 2023-10-14 MED ORDER — SODIUM CHLORIDE 0.9 % IV SOLN
2.0000 g | INTRAVENOUS | Status: DC
  Administered 2023-10-14 – 2023-10-17 (×4): 2 g via INTRAVENOUS
  Filled 2023-10-14 (×4): qty 20

## 2023-10-14 MED ORDER — ACETAMINOPHEN 325 MG PO TABS
650.0000 mg | ORAL_TABLET | Freq: Four times a day (QID) | ORAL | Status: DC | PRN
  Administered 2023-10-14 – 2023-10-15 (×3): 650 mg via ORAL
  Filled 2023-10-14 (×3): qty 2

## 2023-10-14 MED ORDER — IOHEXOL 300 MG/ML  SOLN
100.0000 mL | Freq: Once | INTRAMUSCULAR | Status: AC | PRN
  Administered 2023-10-14: 100 mL via INTRAVENOUS

## 2023-10-14 MED ORDER — ENOXAPARIN SODIUM 40 MG/0.4ML IJ SOSY
40.0000 mg | PREFILLED_SYRINGE | INTRAMUSCULAR | Status: DC
  Administered 2023-10-14 – 2023-10-17 (×4): 40 mg via SUBCUTANEOUS
  Filled 2023-10-14 (×4): qty 0.4

## 2023-10-14 NOTE — Assessment & Plan Note (Signed)
Not acutely exacerbated.  Albuterol as needed 

## 2023-10-14 NOTE — Assessment & Plan Note (Signed)
 Patient presents with confusion x 2 days which is not his baseline.  Oriented to person and place Likely related to sepsis Neurologic checks with fall and aspiration precautions Delirium precautions

## 2023-10-14 NOTE — Plan of Care (Signed)
°  Problem: Fluid Volume: °Goal: Hemodynamic stability will improve °Outcome: Progressing °  °Problem: Clinical Measurements: °Goal: Diagnostic test results will improve °Outcome: Progressing °Goal: Signs and symptoms of infection will decrease °Outcome: Progressing °  °

## 2023-10-14 NOTE — Assessment & Plan Note (Signed)
 No acute issues.

## 2023-10-14 NOTE — Assessment & Plan Note (Signed)
 Clinically compensated Holding carvedilol , Imdur , spironolactone , torsemide  in the setting of sepsis Daily weights Monitor for fluid in view of fluid resuscitation for sepsis

## 2023-10-14 NOTE — Assessment & Plan Note (Signed)
 CPAP nightly

## 2023-10-14 NOTE — Hospital Course (Signed)
 SABRA

## 2023-10-14 NOTE — TOC Initial Note (Signed)
 Transition of Care [facility]) - Initial/Assessment Note    Patient Details  Name: Willie Clark MRN: [id number] Date of Birth: [birth year]  Transition of Care [facility]) CM/SW Contact:    Dalia GORMAN Fuse, RN Phone Number: 10/14/2023, 2:49 PM  Clinical Narrative:                 Patient is from home with his wife. He drives and doesn't report any difficulty getting medications. The patient has DME: cane. His PCP is Harlene Patten and his [facility] [place]. No TOC needs, please outreach to [facility] if needs are identified.     Barriers to Discharge: Continued Medical Work up   Patient Goals and CMS Choice            Expected Discharge Plan and Services   Discharge Planning Services: CM Consult   Living arrangements for the past 2 months: Single Family Home                                      Prior Living Arrangements/Services Living arrangements for the past 2 months: Single Family Home Lives with:: Spouse              Current home services: DME (cane)    Activities of Daily Living   ADL Screening (condition at time of admission) Independently performs ADLs?: Yes (appropriate for developmental age) Is the patient deaf or have difficulty hearing?: No Does the patient have difficulty seeing, even when wearing glasses/contacts?: No Does the patient have difficulty concentrating, remembering, or making decisions?: Yes  Permission Sought/Granted                  Emotional Assessment              Admission diagnosis:  Pyelonephritis [N12] Sepsis (HCC) [A41.9] Altered mental status, unspecified altered mental status type [R41.82] Sepsis, due to unspecified organism, unspecified whether acute organ dysfunction present [facility]) [A41.9] Patient Active Problem List   Diagnosis Date Noted   Sepsis (HCC) 10/14/2023   Acute metabolic encephalopathy 10/14/2023   Acute pyelonephritis 10/14/2023   History of pulmonary embolism  10/14/2023   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 10/14/2023   Pyelonephritis 10/14/2023   Hyponatremia 06/03/2021   Bacteremia due to group B Streptococcus 06/02/2021   Diverticulitis 06/01/2021   Severe sepsis (HCC) 06/01/2021   OSA on CPAP 06/01/2021   COPD (chronic obstructive pulmonary disease) (HCC) 11/16/2020   Acute coronary syndrome (HCC) 06/20/2020   Demand ischemia (HCC) 06/20/2020   GERD (gastroesophageal reflux disease) 06/20/2020   Depression 12/12/2019   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   Carpal tunnel syndrome 04/06/2018   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   Synovial cyst of lumbar facet joint 10/01/2016   Nephrolithiasis    Ureteral obstruction    Obstructive uropathy 09/24/2015   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 [place] [address] Phone: [phone] Fax:  [phone]  [facility] - [place], [place] - [address] [place] [address] Phone: [phone] Fax: [phone]  [facility] [place], [place] - [address] [facility] Portal to Registered [facility] [address] [place] [address] Phone: [phone] Fax: [phone]     Social Drivers of Health (SDOH) Social History: SDOH Screenings   Food Insecurity: Patient Unable To Answer (10/14/2023)  Housing: Patient Unable To Answer (10/14/2023)  Transportation Needs: Patient Unable To Answer (10/14/2023)  Utilities: Patient Unable To Answer  (10/14/2023)  Depression (PHQ2-9): Low Risk  (08/22/2021)  Recent Concern: Depression (PHQ2-9) - Medium Risk (06/22/2021)  Financial Resource Strain: High Risk (07/17/2023)   Received from [facility]  Social Connections: Patient Unable To Answer (10/14/2023)  Tobacco Use: Medium Risk (10/14/2023)   SDOH Interventions:     Readmission Risk Interventions    06/04/2021   12:17 PM  Readmission Risk Prevention Plan  Transportation Screening Complete  PCP or Specialist Appt within 3-5 Days Complete  HRI or Home Care Consult Complete  Social Work Consult for Recovery Care Planning/Counseling Complete  Palliative Care Screening Not Applicable  Medication Review [employer]) Complete

## 2023-10-14 NOTE — Progress Notes (Signed)
 PHARMACY - PHYSICIAN COMMUNICATION CRITICAL VALUE ALERT - BLOOD CULTURE IDENTIFICATION (BCID)  Willie Clark is an 73 y.o. male who presented to [facility] on 10/13/2023 with a chief complaint of weakness and altered mental status w/ dysuria  Assessment:  blood culture from 6/23 with GNR, BCID detects E coli (not resistance).  Concern for cystitis and pyelonephritis per CT.   Name of physician (or Provider) Contacted: Dr Elpidio  Current antibiotics: Ceftriaxone   Changes to prescribed antibiotics recommended:  Patient is on recommended antibiotics - No changes needed  Results for orders placed or performed during the hospital encounter of 10/13/23  Blood Culture ID Panel (Reflexed) (Collected: 10/13/2023 11:28 PM)  Result Value Ref Range   Enterococcus faecalis NOT DETECTED NOT DETECTED   Enterococcus Faecium NOT DETECTED NOT DETECTED   Listeria monocytogenes NOT DETECTED NOT DETECTED   Staphylococcus species NOT DETECTED NOT DETECTED   Staphylococcus aureus (BCID) NOT DETECTED NOT DETECTED   Staphylococcus epidermidis NOT DETECTED NOT DETECTED   Staphylococcus lugdunensis NOT DETECTED NOT DETECTED   Streptococcus species NOT DETECTED NOT DETECTED   Streptococcus agalactiae NOT DETECTED NOT DETECTED   Streptococcus pneumoniae NOT DETECTED NOT DETECTED   Streptococcus pyogenes NOT DETECTED NOT DETECTED   A.calcoaceticus-baumannii NOT DETECTED NOT DETECTED   Bacteroides fragilis NOT DETECTED NOT DETECTED   Enterobacterales DETECTED (A) NOT DETECTED   Enterobacter cloacae complex NOT DETECTED NOT DETECTED   Escherichia coli DETECTED (A) NOT DETECTED   Klebsiella aerogenes NOT DETECTED NOT DETECTED   Klebsiella oxytoca NOT DETECTED NOT DETECTED   Klebsiella pneumoniae NOT DETECTED NOT DETECTED   Proteus species NOT DETECTED NOT DETECTED   Salmonella species NOT DETECTED NOT DETECTED   Serratia marcescens NOT DETECTED NOT DETECTED   Haemophilus influenzae NOT DETECTED NOT DETECTED    Neisseria meningitidis NOT DETECTED NOT DETECTED   Pseudomonas aeruginosa NOT DETECTED NOT DETECTED   Stenotrophomonas maltophilia NOT DETECTED NOT DETECTED   Candida albicans NOT DETECTED NOT DETECTED   Candida auris NOT DETECTED NOT DETECTED   Candida glabrata NOT DETECTED NOT DETECTED   Candida krusei NOT DETECTED NOT DETECTED   Candida parapsilosis NOT DETECTED NOT DETECTED   Candida tropicalis NOT DETECTED NOT DETECTED   Cryptococcus neoformans/gattii NOT DETECTED NOT DETECTED   CTX-M ESBL NOT DETECTED NOT DETECTED   Carbapenem resistance IMP NOT DETECTED NOT DETECTED   Carbapenem resistance KPC NOT DETECTED NOT DETECTED   Carbapenem resistance NDM NOT DETECTED NOT DETECTED   Carbapenem resist OXA 48 LIKE NOT DETECTED NOT DETECTED   Carbapenem resistance VIM NOT DETECTED NOT DETECTED    Celestine Slovak, PharmD, BCPS, BCIDP Work Cell: [phone] 10/14/2023 11:50 AM

## 2023-10-14 NOTE — Assessment & Plan Note (Signed)
 History of CABG Continue rosuvastatin  and aspirin  Holding carvedilol , Imdur  due to soft blood pressures related to sepsis

## 2023-10-14 NOTE — Progress Notes (Signed)
   10/14/23 [id number]  Assess: MEWS Score  Temp 98.5 F (36.9 C)  BP (!) 175/109  MAP (mmHg) 123  Pulse Rate (!) 126  SpO2 100 %  O2 Device Room Air  O2 Flow Rate (L/min) 3 L/min  Assess: MEWS Score  MEWS Temp 0  MEWS Systolic 0  MEWS Pulse 2  MEWS RR 0  MEWS LOC 0  MEWS Score 2  MEWS Score Color Yellow  Assess: if the MEWS score is Yellow or Red  Were vital signs accurate and taken at a resting state? Yes  Does the patient meet 2 or more of the SIRS criteria? No  MEWS guidelines implemented  Yes, yellow  Treat  MEWS Interventions Considered administering scheduled or prn medications/treatments as ordered  Take Vital Signs  Increase Vital Sign Frequency  Yellow: Q2hr x1, continue Q4hrs until patient remains green for 12hrs  Escalate  MEWS: Escalate Yellow: Discuss with charge nurse and consider notifying provider and/or RRT  Notify: Charge Nurse/RN  Name of Charge Nurse/RN Notified Donna,H,RN  Assess: SIRS CRITERIA  SIRS Temperature  0  SIRS Respirations  0  SIRS Pulse 1  SIRS WBC 0  SIRS Score Sum  1

## 2023-10-14 NOTE — H&P (Signed)
 History and Physical    Patient: Willie Clark FMW:[id number] DOB: Aug 30, 1950 DOA: 10/13/2023 DOS: the patient was seen and examined on 10/14/2023 PCP: [facility]  Patient coming from: Home  Chief Complaint:  Chief Complaint  Patient presents with   Weakness   Altered Mental Status    HPI: Willie Clark is a 73 y.o. male with medical history significant for CAD with history of CABG s/p PCI 2022, OSA on CPAP, diverticulitis s/p partial sigmoidectomy, hx PE no longer on AC, HTN, and sCHF EF 40-45%, recurrent nephrolithiasis, being admitted with sepsis secondary to UTI.  He presents with a fever of 103, weakness, confusion and disorientation which is not his baseline.  Had a similar presentation with a prior UTI.  Also reports pain with urination.  Has had no vomiting, abdominal pain or diarrhea.  Denies chest pain or shortness of breath. In the ED febrile to 100.7, tachycardic to 108 with respirations 25, BP 109/70.  Labs notable for strongly positive UA, WBC 11,600 with lactic acid 2.3.  Respiratory viral panel negative.  CMP unremarkable.EKG showed sinus tachycardia at 109 with nonspecific ST-T wave changes.  CT abdomen and pelvis consistent with cystitis and possible left pyelonephritis.  Shows nonobstructive nephrolithiasis and other nonacute findings.  Chest x-ray nonacute. Patient treated with sepsis fluids.  Initially started on cefepime  vancomycin  and Flagyl  prior to recognizing source. Admission requested for sepsis secondary to UTI     Past Medical History:  Diagnosis Date   AAA (abdominal aortic aneurysm) (HCC) 02/23/2020   a.) AAA duplex 02/23/2020: measured 3.0 cm   Acute coronary syndrome (HCC)    Anxiety    Aortic atherosclerosis (HCC)    Arthritis    ASD (atrial septal defect) 01/20/2006   a.) noted on intraoperative TEE; s/p closure   Barrett esophagus    Barrett's esophagus    Bilateral leg edema    Bilateral occipital neuralgia    Bowel  obstruction (HCC)    Carpal tunnel syndrome    CHF (congestive heart failure) (HCC)    Chronic chest pain    COPD (chronic obstructive pulmonary disease) (HCC)    Coronary artery disease    a.)CABG 93; Redo 07; b.)LHC 03/15: 100 mLAD, 60 pLCx, 100 OM1, 80 pRCA, 100 mRCA - med mgmt. c.)LHC 6/17: LAD 100ost,  LCx 60p, OM1 100, 100 oRCA, VG-OM2 min irreg, VG-RPDA 30ost, 20d - med mgmt; d.)LHC 8/21: 100 oLAD, 100 oOM1-OM1, 100 o-pRCA, 100 RPL2, 100 RPL3, 100 OM2; pat grafts - med mgmt; e.)LHC 3/22: 60-70 o-pVG-OM2, 80-99 VG-RCA (DES x3); f.)R/LHC 07/22: 75 (ost) & 60 (mid) VG-OM (DES x2)   Depression    Diverticulitis    Dyshidrosis    Dysphagia    Enlarged prostate without lower urinary tract symptoms (luts)    Erectile dysfunction    Fibromyalgia    Gastric ulcer    Gastritis    GERD (gastroesophageal reflux disease)    Headache    Hemorrhoids    HFrEF (heart failure with reduced ejection fraction) (HCC)    a.) 09/2016 Echo: EF 55-60%, no rwma, triv AI, mild MR, mildly dil LA. -> as of April 2020: EF 40 to 45%.  Septal dyssynergy/hypokinesis due to postop state but otherwise unable to assess wall motion Sharol due to poor study. b.) TTE 12/12/2019: EF 40-45%, LAE, mild AR, G1DD; c.) TTE 06/20/2020: EF 35-40%, sev inferolateral HK, mild AR, G1DD.   History of BPH    History of kidney stones  History of myocardial infarct at age less than 60 years    Hyperlipidemia    Hypertension    Hyponatremia    Idiopathic urticaria    Intermittent claudication (HCC)    Kidney stones    Left ventricular failure (HCC)    Lightheadedness    Liver disease    Long term current use of antithrombotics/antiplatelets    a.) daily DAPT therapy (ASA + clopidogrel )   Mass on back    Migraine without aura    Myocardial infarction (HCC) 1993   Nail dystrophy    Nephrolithiasis    Obstructive uropathy    Orthostatic hypotension    a. Improved after discontinuation of metoprolol .   OSA on CPAP     Peripheral neuropathy    Peripheral neuropathy    Peyronie disease    Postoperative atrial fibrillation/flutter (HCC) 01/20/2006   a.) POD2 following re-do CABG; Tx'd with IV amiodarone, which did not convert; EF 20% on POD3; DCCV (100 J x 1) POD4.   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   a.) 2v CABG: LIMA-LAD, SVG-D2   S/P cervical spinal fusion    S/P RE-DO CABG x 2 01/20/2006   a.) 2v REDO CABG: reserse SVG-PDA, SVG-OM1   Sinus bradycardia    Skin lesions, generalized    Small bowel obstruction (HCC) 02/2009   ST elevation myocardial infarction (STEMI) of inferior wall (HCC) 06/2020   a.) LHC 06/2020 --> subtotally occluded SVG-RCA with long segment of proximal graft disease and 99% stenosis at the distal anastomosis --> successful PCI to SVG to RCA with overlapping Resolute Onyx DES x 3   Stroke [facility]) 1993   right side weakness no blood thinners   on Aspirin    Syncope and collapse    Synovial cyst of lumbar facet joint    TIA (transient ischemic attack) 08/13/2014   Urticaria    Past Surgical History:  Procedure Laterality Date   BACK SURGERY     BIOPSY  04/22/2023   Procedure: BIOPSY;  Surgeon: Maryruth Ole DASEN, MD;  Location: [facility] ENDOSCOPY;  Service: Endoscopy;;  Gastric and Colonic   bowel obstruction  01/2009   CARDIAC CATHETERIZATION  11/2006   patent grafts. Significant OM3 disease and occluded diagonals.   CARDIAC CATHETERIZATION  06/2010   patent grafts. significant ISR in proximal LCX but OM2 is bypassed and gives retrograde flow to OM3.    CARDIAC CATHETERIZATION  06/2013   [facility]: Patent grafts with 60% proximal in-stent restenosis in the left circumflex with FFR of 0.85   CARDIAC CATHETERIZATION Left 09/25/2015   Procedure: Left Heart Cath and Cors/Grafts Angiography;  Surgeon: Deatrice DELENA Cage, MD;  Location: [facility] INVASIVE CV LAB;  Service: Cardiovascular;  Laterality:  Left;   CATARACT EXTRACTION     CERVICAL FUSION     CHOLECYSTECTOMY     COLON SURGERY  01/2009   BOWEL RESECTION DUE TO SMALL BOWEL OBSTRUCTION   COLONOSCOPY     COLONOSCOPY WITH PROPOFOL  N/A 10/28/2017   Procedure: COLONOSCOPY WITH PROPOFOL ;  Surgeon: Gaylyn Gladis PENNER, MD;  Location: [facility] ENDOSCOPY;  Service: Endoscopy;  Laterality: N/A;   COLONOSCOPY WITH PROPOFOL  N/A 07/30/2021   Procedure: COLONOSCOPY WITH PROPOFOL ;  Surgeon: Maryruth Ole DASEN, MD;  Location: [facility] ENDOSCOPY;  Service: Endoscopy;  Laterality: N/A;   COLONOSCOPY WITH PROPOFOL  N/A 04/22/2023   Procedure: COLONOSCOPY WITH PROPOFOL ;  Surgeon: Maryruth Ole  T, MD;  Location: [facility] ENDOSCOPY;  Service: Endoscopy;  Laterality: N/A;   CORONARY ANGIOPLASTY  2006   CORONARY ARTERY BYPASS GRAFT  1993/01/2006   redo at [facility]. LIMA to LAD, SVG to OM2 and SVG to RPDA   CORONARY STENT INTERVENTION N/A 06/20/2020   Procedure: CORONARY STENT INTERVENTION;  Surgeon: Mady Bruckner, MD;  Location: [facility] INVASIVE CV LAB;  Service: Cardiovascular;  Laterality: N/A;   CORONARY STENT INTERVENTION N/A 11/15/2020   Procedure: CORONARY STENT INTERVENTION;  Surgeon: Mady Bruckner, MD;  Location: [facility] INVASIVE CV LAB;  Service: Cardiovascular;  Laterality: N/A;   CYSTOSCOPY WITH STENT PLACEMENT Bilateral 09/24/2015   Procedure: CYSTOSCOPY WITH BILATERAL RETROGRADES, BILATERAL STENT PLACEMENT;  Surgeon: Donnice Brooks, MD;  Location: [facility] ORS;  Service: Urology;  Laterality: Bilateral;   ESOPHAGOGASTRODUODENOSCOPY N/A 10/25/2014   Procedure: ESOPHAGOGASTRODUODENOSCOPY (EGD);  Surgeon: Gladis RAYMOND Mariner, MD;  [facility] ENDOSCOPY;  Service: Endoscopy;  Laterality: N/A;   ESOPHAGOGASTRODUODENOSCOPY (EGD) WITH PROPOFOL  N/A 02/03/2017   Procedure: ESOPHAGOGASTRODUODENOSCOPY (EGD) WITH PROPOFOL ;  Surgeon: Mariner Gladis RAYMOND, MD;  [facility];  Service: Endoscopy;  Laterality: N/A;   ESOPHAGOGASTRODUODENOSCOPY (EGD) WITH  PROPOFOL  N/A 04/18/2017   Procedure: ESOPHAGOGASTRODUODENOSCOPY (EGD) WITH PROPOFOL ;  Surgeon: Mariner Gladis RAYMOND, MD;  Location: [facility] ENDOSCOPY;  Service: Endoscopy;  Laterality: N/A;   ESOPHAGOGASTRODUODENOSCOPY (EGD) WITH PROPOFOL  N/A 04/22/2023   Procedure: ESOPHAGOGASTRODUODENOSCOPY (EGD) WITH PROPOFOL ;  Surgeon: Maryruth Ole DASEN, MD;  Location: [facility] ENDOSCOPY;  Service: Endoscopy;  Laterality: N/A;   EYE SURGERY Bilateral    Cataract Extraction with IOL   HERNIA REPAIR     INGUINAL HERNIA REPAIR Bilateral 01/23/2016   Procedure: HERNIA REPAIR INGUINAL ADULT BILATERAL;  Surgeon: Larinda Unknown Sharps, MD;  Location: [facility] ORS;  Service: General;  Laterality: Bilateral;   LEFT HEART CATH AND CORS/GRAFTS ANGIOGRAPHY N/A 12/13/2019   Procedure: LEFT HEART CATH AND CORS/GRAFTS ANGIOGRAPHY;  Surgeon: Darron Deatrice LABOR, MD;  Location: [facility] INVASIVE CV LAB;  Service: Cardiovascular;  Laterality: N/A;   LEFT HEART CATH AND CORS/GRAFTS ANGIOGRAPHY N/A 06/20/2020   Procedure: LEFT HEART CATH AND CORS/GRAFTS ANGIOGRAPHY;  Surgeon: Mady Bruckner, MD;  Location: [facility] INVASIVE CV LAB;  Service: Cardiovascular;  Laterality: N/A;   LUMBAR LAMINECTOMY/DECOMPRESSION MICRODISCECTOMY Left 10/01/2016   Procedure: Left Lumbar four-five Laminotomy for resection of synovial cyst;  Surgeon: Unice Pac, MD;  [facility] OR;  Service: Neurosurgery;  Laterality: Left;  left   NECK SURGERY  06/2009   POLYPECTOMY  04/22/2023   Procedure: POLYPECTOMY;  Surgeon: Maryruth Ole DASEN, MD;  Location: [facility] ENDOSCOPY;  Service: Endoscopy;;  Colon   RIGHT/LEFT HEART CATH AND CORONARY/GRAFT ANGIOGRAPHY N/A 11/15/2020   Procedure: RIGHT/LEFT HEART CATH AND CORONARY/GRAFT ANGIOGRAPHY;  Surgeon: Mady Bruckner, MD;  Location: [facility] INVASIVE CV LAB;  Service: Cardiovascular;  Laterality: N/A;   SHOULDER SURGERY Bilateral 2010   Social History:  reports that he quit smoking about 32 years ago. His smoking use included  cigarettes. He has never used smokeless tobacco. He reports that he does not currently use alcohol . He reports that he does not use drugs.  Allergies  Allergen Reactions   Amitriptyline Other (See Comments)    Pt unsure of what happens Other reaction(s): Other (See Comments) Pt unsure of what happens Pt unsure of what happens   Amoxicillin -Pot Clavulanate Itching   Cyclobenzaprine Hives and Other (See Comments)    Pt unsure what hapens   Dicyclomine  Shortness Of Breath and Swelling   Sacubitril-Valsartan Anaphylaxis and Itching   Icosapent  Ethyl (Epa  Ethyl Ester) (Fish) Other (See Comments)    tongue felt slighlty swollen/different after starting   Amitriptyline Hcl    Copy (Non-Screening) Diarrhea   Hydrocodone     Fish Oil Itching   Lidocaine  Nausea And Vomiting, Other (See Comments) and Nausea Only    Nasal Spray (only time had reaction) Other reaction(s): Dizziness Nasal Spray (only time had reaction)   Omega-3 Fatty Acids-Vitamin E Itching   Other Diarrhea and Itching    Reactive agents: Onions, bacon, steak   Oxycodone -Acetaminophen  Other (See Comments)    Lowers BP Other reaction(s): Other (See Comments) Lowers BP Lowers BP   Sulfa Antibiotics Itching    Family History  Problem Relation Age of Onset   Heart disease Father 18   Heart attack Father    Breast cancer Paternal Aunt     Prior to Admission medications   Medication Sig Start Date End Date Taking? Authorizing Provider  acetaminophen  (TYLENOL ) 500 MG tablet Take 1,000 mg by mouth every 6 (six) hours as needed for mild pain.   Yes [provider]  albuterol  (VENTOLIN  HFA) 108 (90 Base) MCG/ACT inhaler Inhale 1 puff into the lungs as needed. 11/01/21  Yes [provider]  aspirin  EC 81 MG EC tablet Take 1 tablet (81 mg total) by mouth daily. Swallow whole. 06/23/20  Yes Dickie Begun, MD  benzonatate (TESSALON) 100 MG capsule Take 100 mg by mouth 3 (three) times daily as needed  for cough. 10/13/23  Yes [provider]  carvedilol  (COREG ) 3.125 MG tablet Take 1 tablet (3.125 mg total) by mouth 2 (two) times daily with a meal. 08/22/23  Yes Darron Deatrice LABOR, MD  CIPRO  500 MG tablet Take 500 mg by mouth 2 (two) times daily. 10/13/23 10/20/23 Yes [provider]  citalopram  (CELEXA ) 20 MG tablet Take 20 mg by mouth daily.    Yes [provider]  clobetasol  cream (TEMOVATE ) 0.05 % APPLY TO ITCHY AREAS ON ELBOWS TWICE A DAY UNTIL IMPROVED. AVOID FACE, GROIN, UNDERARMS. Patient taking differently: Apply 1 Application topically 2 (two) times daily. Apply to itchy areas on elbows twice a day until improved. Avoid face, groin, underarms. 01/09/23  Yes Jackquline Sawyer, MD  clopidogrel  (PLAVIX ) 75 MG tablet TAKE 1 TABLET EVERY DAY 08/22/23  Yes Darron Deatrice LABOR, MD  EPINEPHrine  0.3 mg/0.3 mL IJ SOAJ injection Inject 0.3 mg into the muscle as needed. 12/27/19  Yes [provider]  fexofenadine (ALLEGRA) 180 MG tablet Take 180 mg by mouth daily.   Yes [provider]  gabapentin  (NEURONTIN ) 400 MG capsule Take 400-800 mg by mouth 3 (three) times daily. 07/21/23  Yes [provider]  isosorbide  mononitrate (IMDUR ) 60 MG 24 hr tablet Take 1 tablet (60 mg total) by mouth daily. 08/22/23  Yes Darron Deatrice LABOR, MD  Multiple Vitamins-Minerals (HAIR SKIN AND NAILS FORMULA PO) Take 1 capsule by mouth daily.   Yes [provider]  nitroGLYCERIN  (NITROSTAT ) 0.4 MG SL tablet Place 1 tablet (0.4 mg total) under the tongue every 5 (five) minutes as needed for chest pain. 06/22/20  Yes Dickie Begun, MD  pantoprazole  (PROTONIX ) 40 MG tablet Take 40 mg by mouth 2 (two) times daily.    Yes [provider]  potassium chloride  SA (KLOR-CON  M) 20 MEQ tablet Take 1 tablet (20 mEq total) by mouth daily. 08/22/23  Yes Darron Deatrice LABOR, MD  Probiotic Product (PROBIOTIC PO) Take by mouth daily at 8 pm.   Yes [provider]  Psyllium (DAILY FIBER  PO) Take 1 tablet by mouth daily.   Yes [provider]  rosuvastatin  (CRESTOR ) 40 MG tablet Take 1 tablet (40 mg total) by mouth daily. 08/22/23  Yes Darron Deatrice LABOR, MD  spironolactone  (ALDACTONE ) 25 MG tablet Take 1 tablet (25 mg total) by mouth daily. 08/22/23  Yes Darron Deatrice LABOR, MD  torsemide  (DEMADEX ) 20 MG tablet Take 2 tablets (40 mg total) by mouth daily. 08/22/23  Yes Darron Deatrice LABOR, MD  traMADol  (ULTRAM ) 50 MG tablet Take 1 tablet (50 mg total) by mouth every 6 (six) hours as needed for up to 15 doses for severe pain or moderate pain. 10/17/21  Yes Sakai, Isami, DO  vitamin B-12 (CYANOCOBALAMIN ) 1000 MCG tablet Take 1,000 mcg by mouth daily.   Yes [provider]  finasteride  (PROSCAR ) 5 MG tablet Take 5 mg by mouth daily. Patient not taking: Reported on 10/14/2023    [provider]    Physical Exam: Vitals:   10/13/23 2309 10/14/23 0125 10/14/23 0138 10/14/23 0220  BP: 129/77  109/70 128/73  Pulse: (!) 108  (!) 103 98  Resp: (!) 25  17 20   Temp: (!) 100.7 F (38.2 C) 99.4 F (37.4 C)    TempSrc: Oral Oral    SpO2: 95%  93% 97%   Physical Exam Vitals and nursing note reviewed.  Constitutional:      General: He is not in acute distress. HENT:     Head: Normocephalic and atraumatic.   Cardiovascular:     Rate and Rhythm: Regular rhythm. Tachycardia present.     Heart sounds: Normal heart sounds.  Pulmonary:     Effort: Tachypnea present.     Breath sounds: Normal breath sounds.  Abdominal:     Palpations: Abdomen is soft.     Tenderness: There is no abdominal tenderness.   Neurological:     General: No focal deficit present.     Labs on Admission: I have personally reviewed following labs and imaging studies  CBC: Recent Labs  Lab 10/13/23 2328  WBC 11.6*  NEUTROABS 10.0*  HGB 12.9*  HCT 37.2*  MCV 98.9  PLT 203   Basic Metabolic Panel: Recent Labs  Lab 10/13/23 2328  NA 135  K 3.5  CL 97*  CO2 24  GLUCOSE 186*   BUN 9  CREATININE 0.96  CALCIUM  9.3   GFR: CrCl cannot be calculated (Unknown ideal weight.). Liver Function Tests: Recent Labs  Lab 10/13/23 2328  AST 50*  ALT 37  ALKPHOS 79  BILITOT 1.1  PROT 7.1  ALBUMIN 3.9   No results for input(s): LIPASE, AMYLASE in the last 168 hours. No results for input(s): AMMONIA in the last 168 hours. Coagulation Profile: Recent Labs  Lab 10/13/23 2328  INR 1.0   Cardiac Enzymes: No results for input(s): CKTOTAL, CKMB, CKMBINDEX, TROPONINI in the last 168 hours. BNP (last 3 results) No results for input(s): PROBNP in the last 8760 hours. HbA1C: No results for input(s): HGBA1C in the last 72 hours. CBG: No results for input(s): GLUCAP in the last 168 hours. Lipid Profile: No results for input(s): CHOL, HDL, LDLCALC, TRIG, CHOLHDL, LDLDIRECT in the last 72 hours. Thyroid Function Tests: No results for input(s): TSH, T4TOTAL, FREET4, T3FREE, THYROIDAB in the last 72 hours. Anemia Panel: No results for input(s): VITAMINB12, FOLATE, FERRITIN, TIBC, IRON, RETICCTPCT in the last 72 hours. Urine analysis:    Component Value Date/Time   COLORURINE STRAW (A) 10/14/2023 0009  APPEARANCEUR CLEAR (A) 10/14/2023 0009   APPEARANCEUR Clear 03/14/2021 1017   LABSPEC 1.009 10/14/2023 0009   PHURINE 7.0 10/14/2023 0009   GLUCOSEU NEGATIVE 10/14/2023 0009   HGBUR MODERATE (A) 10/14/2023 0009   BILIRUBINUR NEGATIVE 10/14/2023 0009   BILIRUBINUR Negative 03/14/2021 1017   KETONESUR NEGATIVE 10/14/2023 0009   PROTEINUR NEGATIVE 10/14/2023 0009   NITRITE NEGATIVE 10/14/2023 0009   LEUKOCYTESUR MODERATE (A) 10/14/2023 0009    Radiological Exams on Admission: CT ABDOMEN PELVIS W CONTRAST Result Date: 10/14/2023 CLINICAL DATA:  Right lower quadrant pain. Weakness, dizziness and fever. Possible sepsis. EXAM: CT ABDOMEN AND PELVIS WITH CONTRAST TECHNIQUE: Multidetector CT imaging of the abdomen and  pelvis was performed using the standard protocol following bolus administration of intravenous contrast. RADIATION DOSE REDUCTION: This exam was performed according to the departmental dose-optimization program which includes automated exposure control, adjustment of the mA and/or kV according to patient size and/or use of iterative reconstruction technique. CONTRAST:  OMNIPAQUE  IOHEXOL  300 MG/ML  SOLN COMPARISON:  CT with IV contrast 08/06/2022 and 11/08/2021. FINDINGS: Lower chest: Linear scarring or atelectasis both lung bases. Mild elevation right hemidiaphragm. The cardiac size is normal. 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 Small hiatal hernia. Hepatobiliary: The liver is 22 cm length with mild-to-moderate steatosis. The gallbladder is absent, without bile duct dilatation. Pancreas: No abnormality. Spleen: No abnormality. Adrenals/Urinary Tract: There is no adrenal mass. There is a stable 1.3 cm Bosniak 2 cyst in the upper pole left kidney, Hounsfield density is 30, stable subcentimeter Bosniak 2 cyst in the posterior right kidney is too small to characterize. No follow-up imaging is recommended. Both kidneys are otherwise homogeneous. There are occasional bilateral punctate nonobstructive caliceal stones in the kidneys. There are no ureteral stones and no hydronephrosis. In the lower pole of the left kidney there is subtle asymmetric hypoenhancement anteriorly concerning for pyelonephritis, with asymmetric increased left perinephric edema. Additionally there is mild urothelial thickening in the left ureter suggesting an ascending UTI. There is mild bladder thickening with slight perivesical stranding concerning for cystitis. Stomach/Bowel: No dilatation or wall thickening, including the appendix. There is a surgical colocolic anastomosis in the sigmoid segment, scattered colonic diverticulosis without evidence of an acute diverticulitis.  Vascular/Lymphatic: There is moderate aortoiliac calcific plaque without aneurysm. Visceral branch vessel atherosclerosis. No adenopathy is seen. Reproductive: Surgically absent prostate. No mass in the prostate bed. Both testicles in the scrotal sac. Other: Umbilical and left inguinal fat hernias. Unchanged. No incarcerated hernia. No free fluid, free hemorrhage or free air. Musculoskeletal: There is osteopenia with degenerative changes of the spine. Mild hip DJD. IMPRESSION: 1. Findings concerning for cystitis and ascending UTI on the left. 2. Subtle asymmetric hypoenhancement in the lower pole of the left kidney anteriorly concerning for pyelonephritis. 3. Nonobstructive nephrolithiasis. 4. Diverticulosis without evidence of diverticulitis. 5. Aortic and coronary artery atherosclerosis. 6. Umbilical and left inguinal fat hernias. 7. Osteopenia and degenerative change. 8. Small hiatal hernia. Aortic Atherosclerosis (ICD10-I70.0). Electronically Signed   By: Francis Quam M.D.   On: 10/14/2023 01:40   DG Chest Port 1 View Result Date: 10/13/2023 CLINICAL DATA:  Sepsis EXAM: PORTABLE CHEST 1 VIEW COMPARISON:  Chest x-ray 08/22/2021 FINDINGS: Patient is status post cardiac surgery. The heart size and mediastinal contours are within normal limits. Both lungs are clear. Cervical spinal fusion plates are seen. No acute fractures are identified. IMPRESSION: No active disease. Electronically Signed   By: Greig Maple HERO.D.  On: 10/13/2023 23:43   Data Reviewed for HPI: Relevant notes from primary care and specialist visits, past discharge summaries as available in EHR, including Care Everywhere. Prior diagnostic testing as pertinent to current admission diagnoses Updated medications and problem lists for reconciliation ED course, including vitals, labs, imaging, treatment and response to treatment Triage notes, nursing and pharmacy notes and ED provider's notes Notable results as noted above in  HPI      Assessment and Plan: * Acute pyelonephritis Sepsis Nephrolithiasis, nonobstructive Sepsis criteria include fever, tachycardia and tachypnea, leukocytosis and lactic acidosis, altered mental status Sepsis fluids Cefepime  for complicated UTI Follow cultures   Acute metabolic encephalopathy Patient presents with confusion x 2 days which is not his baseline.  Oriented to person and place Likely related to sepsis Neurologic checks with fall and aspiration precautions Delirium precautions  Chronic HFrEF (heart failure with reduced ejection fraction) (HCC) Clinically compensated Holding carvedilol , Imdur , spironolactone , torsemide  in the setting of sepsis Daily weights Monitor for fluid in view of fluid resuscitation for sepsis  CAD S/P percutaneous coronary angioplasty History of CABG Continue rosuvastatin  and aspirin  Holding carvedilol , Imdur  due to soft blood pressures related to sepsis  COPD (chronic obstructive pulmonary disease) (HCC) Not acutely exacerbated Albuterol  as needed  History of pulmonary embolism No longer on anticoagulation  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 No acute issues  OSA on CPAP CPAP nightly    DVT prophylaxis: Lovenox   Consults: none  Advance Care Planning:   Code Status: Prior   Family Communication: none  Disposition Plan: Back to previous home environment  Severity of Illness: The appropriate patient status for this patient is OBSERVATION. Observation status is judged to be reasonable and necessary in order to provide the required intensity of service to ensure the patient's safety. The patient's presenting symptoms, physical exam findings, and initial radiographic and laboratory data in the context of their medical condition is felt to place them at decreased risk for further clinical deterioration. Furthermore, it is anticipated that the patient will be medically stable for discharge from  the hospital within 2 midnights of admission.   Author: Delayne LULLA Solian, MD 10/14/2023 2:33 AM  For on call review [email or web address].

## 2023-10-14 NOTE — Assessment & Plan Note (Signed)
 Sepsis Nephrolithiasis, nonobstructive Sepsis criteria include fever, tachycardia and tachypnea, leukocytosis and lactic acidosis, altered mental status Sepsis fluids Cefepime  for complicated UTI Follow cultures

## 2023-10-14 NOTE — Assessment & Plan Note (Signed)
 No longer on anticoagulation

## 2023-10-14 NOTE — Progress Notes (Signed)
 PROGRESS NOTE  Willie Clark  FMW:[id number] DOB: 02/11/1951 DOA: 10/13/2023 PCP: [facility]  Brief Narrative: 73 y.o. male with a PMH significant for CAD with history of CABG s/p PCI 2022, OSA on CPAP, diverticulitis s/p partial sigmoidectomy, hx PE no longer on AC, HTN, and sCHF EF 40-45%, recurrent nephrolithiasis, being admitted with sepsis secondary to UTI.  He presents with a fever of 103, weakness, confusion and disorientation which is not his baseline.  Had a similar presentation with a prior UTI.  Also reports pain and hematuria with urination.  Has had no vomiting, abdominal pain or diarrhea.  Denies chest pain or shortness of breath. In the ED febrile to 100.7, tachycardic to 108 with respirations 25, BP 109/70.  Labs notable for strongly positive UA, WBC 11,600 with lactic acid 2.3.  Respiratory viral panel negative.  CMP unremarkable.EKG showed sinus tachycardia at 109 with nonspecific ST-T wave changes.  CT abdomen and pelvis consistent with cystitis and possible left pyelonephritis.  Shows nonobstructive nephrolithiasis and other nonacute findings.  Chest x-ray nonacute.  Patient treated with sepsis fluids.  Initially started on cefepime  vancomycin  and Flagyl  prior to recognizing source. Admission requested for sepsis secondary to UTI    Assessment & Plan: * Acute pyelonephritis Sepsis Nephrolithiasis, nonobstructive Sepsis criteria include fever, tachycardia and tachypnea, leukocytosis and lactic acidosis, altered mental status--> resolving.  Still on IV fluids and still with slightly elevated lactate on a.m. labs this morning but downtrending from 7.4. -Initially started on cefepime  for complicated UTI, this was switched to ceftriaxone  on admission. -Patient much more awake and oriented today.  Does not really remember the past several days.  Remembers EMS coming to his house but then woke up admitted to the hospital.  Does not remember ride  to the hospital nor being in the emergency department. -Continue antibiotics and trend white count.  Await cultures.    Acute metabolic encephalopathy Patient presents with confusion x 2 days which is not his baseline.  Oriented to person and place--> resolved on my examination this afternoon.  Oriented x 4. -Secondary to initial sepsis   Chronic HFrEF (heart failure with reduced ejection fraction) (HCC) Clinically compensated Holding carvedilol , Imdur , spironolactone , torsemide  in the setting of sepsis Daily weights, monitor for fluid overload in view of fluid resuscitation for sepsis, no issues today.   CAD S/P percutaneous coronary angioplasty History of CABG Continue rosuvastatin  and aspirin  Holding carvedilol , Imdur  due to soft blood pressures related to sepsis.  Restart as blood pressure improves.   COPD (chronic obstructive pulmonary disease) (HCC) Not acutely exacerbated Albuterol  as needed   History of pulmonary embolism No longer on anticoagulation   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 No acute issues   OSA on CPAP CPAP nightly     DVT prophylaxis:  enoxaparin  (LOVENOX ) injection 40 mg Start: 10/14/23 1000  Code Status:   Code Status: Full Code Family Communication: Wife at bedside.  All questions answered Level of care: Telemetry Medical Status is: Inpatient Dispo: In house while on IV antibiotics for acute pyelonephritis  Consults called: none   Subjective: Patient awake and alert and had just finished lunch.  Eating and drinking well.  Feels much more clearheaded than when he came into the hospital.  Foley in place.  No complaints or concerns other than still feeling a little warm.  Objective: Vitals:   10/14/23 0337 10/14/23 0433 10/14/23 0749 10/14/23 1134  BP: (!) 175/109 125/60 113/71 114/60  Pulse: (!) 126 (!) 120 (!) 114 78  Resp:   20 18  Temp: 98.5 F (36.9 C) 99.4 F (37.4 C) 99.6 F (37.6 C) 98.2 F (36.8  C)  TempSrc:      SpO2: 100% 95% 98% 98%    Intake/Output Summary (Last 24 hours) at 10/14/2023 1427 Last data filed at 10/14/2023 1300 Gross per 24 hour  Intake 100 ml  Output --  Net 100 ml   There were no vitals filed for this visit. There is no height or weight on file to calculate BMI.  Gen: 73 y.o. male in no apparent distress.  Nontoxic Pulm: Non-labored breathing.  Clear to auscultation bilaterally.  CV: Regular rate and rhythm.  GI: Abdomen soft, minimally tender bilateral lower quadrants.  No distention. Ext: Warm, no deformities, no pedal edema Skin: No rashes, lesions  Neuro: Alert and oriented x 4. No focal neurological deficits. Psych: Calm  Judgement and insight appear normal. Mood & affect appropriate.     I have personally reviewed the following labs and images: CBC: Recent Labs  Lab 10/13/23 2328 10/14/23 0823  WBC 11.6* 14.4*  NEUTROABS 10.0*  --   HGB 12.9* 12.0*  HCT 37.2* 35.8*  MCV 98.9 100.6*  PLT 203 162   BMP &GFR Recent Labs  Lab 10/13/23 2328 10/14/23 0823  NA 135 138  K 3.5 3.5  CL 97* 100  CO2 24 26  GLUCOSE 186* 178*  BUN 9 8  CREATININE 0.96 1.04  CALCIUM  9.3 8.6*   CrCl cannot be calculated (Unknown ideal weight.). Liver & Pancreas: Recent Labs  Lab 10/13/23 2328 10/14/23 0823  AST 50* 49*  ALT 37 35  ALKPHOS 79 69  BILITOT 1.1 1.0  PROT 7.1 5.9*  ALBUMIN 3.9 3.2*   No results for input(s): LIPASE, AMYLASE in the last 168 hours. No results for input(s): AMMONIA in the last 168 hours. Diabetic: No results for input(s): HGBA1C in the last 72 hours. No results for input(s): GLUCAP in the last 168 hours. Cardiac Enzymes: No results for input(s): CKTOTAL, CKMB, CKMBINDEX, TROPONINI in the last 168 hours. No results for input(s): PROBNP in the last 8760 hours. Coagulation Profile: Recent Labs  Lab 10/13/23 2328  INR 1.0   Thyroid Function Tests: No results for input(s): TSH, T4TOTAL,  FREET4, T3FREE, THYROIDAB in the last 72 hours. Lipid Profile: No results for input(s): CHOL, HDL, LDLCALC, TRIG, CHOLHDL, LDLDIRECT in the last 72 hours. Anemia Panel: No results for input(s): VITAMINB12, FOLATE, FERRITIN, TIBC, IRON, RETICCTPCT in the last 72 hours. Urine analysis:    Component Value Date/Time   COLORURINE STRAW (A) 10/14/2023 0009   APPEARANCEUR CLEAR (A) 10/14/2023 0009   APPEARANCEUR Clear 03/14/2021 1017   LABSPEC 1.009 10/14/2023 0009   PHURINE 7.0 10/14/2023 0009   GLUCOSEU NEGATIVE 10/14/2023 0009   HGBUR MODERATE (A) 10/14/2023 0009   BILIRUBINUR NEGATIVE 10/14/2023 0009   BILIRUBINUR Negative 03/14/2021 1017   KETONESUR NEGATIVE 10/14/2023 0009   PROTEINUR NEGATIVE 10/14/2023 0009   NITRITE NEGATIVE 10/14/2023 0009   LEUKOCYTESUR MODERATE (A) 10/14/2023 0009   Sepsis Labs: Invalid input(s): PROCALCITONIN, LACTICIDVEN  Microbiology: Recent Results (from the past 240 hours)  Culture, blood (Routine x 2)     Status: None (Preliminary result)   Collection Time: 10/13/23 11:28 PM   Specimen: BLOOD  Result Value Ref Range Status   Specimen Description BLOOD BLOOD LEFT ARM  Final   Special Requests   Final    BOTTLES DRAWN AEROBIC  AND ANAEROBIC Blood Culture adequate volume   Culture  Setup Time   Final    GRAM NEGATIVE RODS ANAEROBIC BOTTLE ONLY Organism ID to follow CRITICAL RESULT CALLED TO, READ BACK BY AND VERIFIED WITHBETHA LONGS [facility] PHARMD 1142 10/14/23 HNM    Culture   Final    NO GROWTH < 12 HOURS Performed at [facility], [address], [place], [place] [address]    Report Status PENDING  Incomplete  Resp panel by RT-PCR (RSV, Flu A&B, Covid) Anterior Nasal Swab     Status: None   Collection Time: 10/13/23 11:28 PM   Specimen: Anterior Nasal Swab  Result Value Ref Range Status   SARS Coronavirus 2 by RT PCR NEGATIVE NEGATIVE Final    Comment: (NOTE) SARS-CoV-2 target nucleic acids are NOT  DETECTED.  The SARS-CoV-2 RNA is generally detectable in upper respiratory specimens during the acute phase of infection. The lowest concentration of SARS-CoV-2 viral copies this assay can detect is 138 copies/mL. A negative result does not preclude SARS-Cov-2 infection and should not be used as the sole basis for treatment or other patient management decisions. A negative result may occur with  improper specimen collection/handling, submission of specimen other than nasopharyngeal swab, presence of viral mutation(s) within the areas targeted by this assay, and inadequate number of viral copies(<138 copies/mL). A negative result must be combined with clinical observations, patient history, and epidemiological information. The expected result is Negative.  Fact Sheet for Patients:  BloggerCourse.com  Fact Sheet for Healthcare Providers:  SeriousBroker.it  This test is no t yet approved or cleared by the [place]  FDA and  has been authorized for detection and/or diagnosis of SARS-CoV-2 by FDA under an Emergency Use Authorization (EUA). This EUA will remain  in effect (meaning this test can be used) for the duration of the COVID-19 declaration under Section 564(b)(1) of the Act, 21 U.S.C.section 360bbb-3(b)(1), unless the authorization is terminated  or revoked sooner.       Influenza A by PCR NEGATIVE NEGATIVE Final   Influenza B by PCR NEGATIVE NEGATIVE Final    Comment: (NOTE) The Xpert Xpress SARS-CoV-2/FLU/RSV plus assay is intended as an aid in the diagnosis of influenza from Nasopharyngeal swab specimens and should not be used as a sole basis for treatment. Nasal washings and aspirates are unacceptable for Xpert Xpress SARS-CoV-2/FLU/RSV testing.  Fact Sheet for Patients: BloggerCourse.com  Fact Sheet for Healthcare Providers: SeriousBroker.it  This test is not yet  approved or cleared by the [place]  FDA and has been authorized for detection and/or diagnosis of SARS-CoV-2 by FDA under an Emergency Use Authorization (EUA). This EUA will remain in effect (meaning this test can be used) for the duration of the COVID-19 declaration under Section 564(b)(1) of the Act, 21 U.S.C. section 360bbb-3(b)(1), unless the authorization is terminated or revoked.     Resp Syncytial Virus by PCR NEGATIVE NEGATIVE Final    Comment: (NOTE) Fact Sheet for Patients: BloggerCourse.com  Fact Sheet for Healthcare Providers: SeriousBroker.it  This test is not yet approved or cleared by the [place]  FDA and has been authorized for detection and/or diagnosis of SARS-CoV-2 by FDA under an Emergency Use Authorization (EUA). This EUA will remain in effect (meaning this test can be used) for the duration of the COVID-19 declaration under Section 564(b)(1) of the Act, 21 U.S.C. section 360bbb-3(b)(1), unless the authorization is terminated or revoked.  Performed at [facility], [address], [place], [place] [address]   Blood  Culture ID Panel (Reflexed)     Status: Abnormal   Collection Time: 10/13/23 11:28 PM  Result Value Ref Range Status   Enterococcus faecalis NOT DETECTED NOT DETECTED Final   Enterococcus Faecium NOT DETECTED NOT DETECTED Final   Listeria monocytogenes NOT DETECTED NOT DETECTED Final   Staphylococcus species NOT DETECTED NOT DETECTED Final   Staphylococcus aureus (BCID) NOT DETECTED NOT DETECTED Final   Staphylococcus epidermidis NOT DETECTED NOT DETECTED Final   Staphylococcus lugdunensis NOT DETECTED NOT DETECTED Final   Streptococcus species NOT DETECTED NOT DETECTED Final   Streptococcus agalactiae NOT DETECTED NOT DETECTED Final   Streptococcus pneumoniae NOT DETECTED NOT DETECTED Final   Streptococcus pyogenes NOT DETECTED NOT DETECTED Final   A.calcoaceticus-baumannii  NOT DETECTED NOT DETECTED Final   Bacteroides fragilis NOT DETECTED NOT DETECTED Final   Enterobacterales DETECTED (A) NOT DETECTED Final    Comment: Enterobacterales represent a large order of gram negative bacteria, not a single organism. CRITICAL RESULT CALLED TO, READ BACK BY AND VERIFIED WITH: ERIN [facility] PHARMD 1142 10/14/23 HNM    Enterobacter cloacae complex NOT DETECTED NOT DETECTED Final   Escherichia coli DETECTED (A) NOT DETECTED Final    Comment: CRITICAL RESULT CALLED TO, READ BACK BY AND VERIFIED WITH: ERIN [facility] PHARMD 1142 10/14/23 HNM    Klebsiella aerogenes NOT DETECTED NOT DETECTED Final   Klebsiella oxytoca NOT DETECTED NOT DETECTED Final   Klebsiella pneumoniae NOT DETECTED NOT DETECTED Final   Proteus species NOT DETECTED NOT DETECTED Final   Salmonella species NOT DETECTED NOT DETECTED Final   Serratia marcescens NOT DETECTED NOT DETECTED Final   Haemophilus influenzae NOT DETECTED NOT DETECTED Final   Neisseria meningitidis NOT DETECTED NOT DETECTED Final   Pseudomonas aeruginosa NOT DETECTED NOT DETECTED Final   Stenotrophomonas maltophilia NOT DETECTED NOT DETECTED Final   Candida albicans NOT DETECTED NOT DETECTED Final   Candida auris NOT DETECTED NOT DETECTED Final   Candida glabrata NOT DETECTED NOT DETECTED Final   Candida krusei NOT DETECTED NOT DETECTED Final   Candida parapsilosis NOT DETECTED NOT DETECTED Final   Candida tropicalis NOT DETECTED NOT DETECTED Final   Cryptococcus neoformans/gattii NOT DETECTED NOT DETECTED Final   CTX-M ESBL NOT DETECTED NOT DETECTED Final   Carbapenem resistance IMP NOT DETECTED NOT DETECTED Final   Carbapenem resistance KPC NOT DETECTED NOT DETECTED Final   Carbapenem resistance NDM NOT DETECTED NOT DETECTED Final   Carbapenem resist OXA 48 LIKE NOT DETECTED NOT DETECTED Final   Carbapenem resistance VIM NOT DETECTED NOT DETECTED Final    Comment: Performed at [facility], [address]  [place], [place] [address]  Culture, blood (Routine x 2)     Status: None (Preliminary result)   Collection Time: 10/14/23 12:12 AM   Specimen: BLOOD  Result Value Ref Range Status   Specimen Description BLOOD BLOOD RIGHT HAND  Final   Special Requests   Final    BOTTLES DRAWN AEROBIC AND ANAEROBIC Blood Culture results may not be optimal due to an excessive volume of blood received in culture bottles   Culture   Final    NO GROWTH < 12 HOURS Performed at [facility], [address] [place], [place] [address]    Report Status PENDING  Incomplete    Radiology Studies: CT ABDOMEN PELVIS W CONTRAST Result Date: 10/14/2023 CLINICAL DATA:  Right lower quadrant pain. Weakness, dizziness and fever. Possible sepsis. EXAM: CT ABDOMEN AND PELVIS WITH CONTRAST TECHNIQUE: Multidetector CT imaging of  the abdomen and pelvis was performed using the standard protocol following bolus administration of intravenous contrast. RADIATION DOSE REDUCTION: This exam was performed according to the departmental dose-optimization program which includes automated exposure control, adjustment of the mA and/or kV according to patient size and/or use of iterative reconstruction technique. CONTRAST:  OMNIPAQUE  IOHEXOL  300 MG/ML  SOLN COMPARISON:  CT with IV contrast 08/06/2022 and 11/08/2021. FINDINGS: Lower chest: Linear scarring or atelectasis both lung bases. Mild elevation right hemidiaphragm. The cardiac size is normal. 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 Small hiatal hernia. Hepatobiliary: The liver is 22 cm length with mild-to-moderate steatosis. The gallbladder is absent, without bile duct dilatation. Pancreas: No abnormality. Spleen: No abnormality. Adrenals/Urinary Tract: There is no adrenal mass. There is a stable 1.3 cm Bosniak 2 cyst in the upper pole left kidney, Hounsfield density is 30, stable subcentimeter Bosniak 2 cyst in the posterior  right kidney is too small to characterize. No follow-up imaging is recommended. Both kidneys are otherwise homogeneous. There are occasional bilateral punctate nonobstructive caliceal stones in the kidneys. There are no ureteral stones and no hydronephrosis. In the lower pole of the left kidney there is subtle asymmetric hypoenhancement anteriorly concerning for pyelonephritis, with asymmetric increased left perinephric edema. Additionally there is mild urothelial thickening in the left ureter suggesting an ascending UTI. There is mild bladder thickening with slight perivesical stranding concerning for cystitis. Stomach/Bowel: No dilatation or wall thickening, including the appendix. There is a surgical colocolic anastomosis in the sigmoid segment, scattered colonic diverticulosis without evidence of an acute diverticulitis. Vascular/Lymphatic: There is moderate aortoiliac calcific plaque without aneurysm. Visceral branch vessel atherosclerosis. No adenopathy is seen. Reproductive: Surgically absent prostate. No mass in the prostate bed. Both testicles in the scrotal sac. Other: Umbilical and left inguinal fat hernias. Unchanged. No incarcerated hernia. No free fluid, free hemorrhage or free air. Musculoskeletal: There is osteopenia with degenerative changes of the spine. Mild hip DJD. IMPRESSION: 1. Findings concerning for cystitis and ascending UTI on the left. 2. Subtle asymmetric hypoenhancement in the lower pole of the left kidney anteriorly concerning for pyelonephritis. 3. Nonobstructive nephrolithiasis. 4. Diverticulosis without evidence of diverticulitis. 5. Aortic and coronary artery atherosclerosis. 6. Umbilical and left inguinal fat hernias. 7. Osteopenia and degenerative change. 8. Small hiatal hernia. Aortic Atherosclerosis (ICD10-I70.0). Electronically Signed   By: Francis Quam M.D.   On: 10/14/2023 01:40   DG Chest Port 1 View Result Date: 10/13/2023 CLINICAL DATA:  Sepsis EXAM: PORTABLE CHEST 1  VIEW COMPARISON:  Chest x-ray 08/22/2021 FINDINGS: Patient is status post cardiac surgery. The heart size and mediastinal contours are within normal limits. Both lungs are clear. Cervical spinal fusion plates are seen. No acute fractures are identified. IMPRESSION: No active disease. Electronically Signed   By: Greig Pique M.D.   On: 10/13/2023 23:43    Scheduled Meds:  aspirin  EC  81 mg Oral Daily   citalopram   20 mg Oral Daily   enoxaparin  (LOVENOX ) injection  40 mg Subcutaneous Q24H   pantoprazole   40 mg Oral BID   rosuvastatin   40 mg Oral Daily   Continuous Infusions:  cefTRIAXone  (ROCEPHIN )  IV 2 g (10/14/23 0828)   lactated ringers  150 mL/hr at 10/13/23 2355   lactated ringers  150 mL/hr (10/14/23 1310)     LOS: 0 days   35 minutes with more than 50% spent in reviewing records, counseling patient/family and coordinating care.  Reyes VEAR Gaw, MD [facility] [email or web address]  10/14/2023, 2:27 PM

## 2023-10-15 DIAGNOSIS — N10 Acute tubulo-interstitial nephritis: Secondary | ICD-10-CM | POA: Diagnosis not present

## 2023-10-15 LAB — COMPREHENSIVE METABOLIC PANEL WITH GFR
ALT: 31 U/L (ref 0–44)
AST: 50 U/L — ABNORMAL HIGH (ref 15–41)
Albumin: 3.1 g/dL — ABNORMAL LOW (ref 3.5–5.0)
Alkaline Phosphatase: 68 U/L (ref 38–126)
Anion gap: 12 (ref 5–15)
BUN: 7 mg/dL — ABNORMAL LOW (ref 8–23)
CO2: 23 mmol/L (ref 22–32)
Calcium: 8.8 mg/dL — ABNORMAL LOW (ref 8.9–10.3)
Chloride: 102 mmol/L (ref 98–111)
Creatinine, Ser: 0.76 mg/dL (ref 0.61–1.24)
GFR, Estimated: >60 mL/min (ref >60)
Glucose, Bld: 168 mg/dL — ABNORMAL HIGH (ref 70–99)
Potassium: 3.3 mmol/L — ABNORMAL LOW (ref 3.5–5.1)
Sodium: 137 mmol/L (ref 135–145)
Total Bilirubin: 1 mg/dL (ref 0.0–1.2)
Total Protein: 6 g/dL — ABNORMAL LOW (ref 6.5–8.1)

## 2023-10-15 LAB — URINE CULTURE: Culture: NO GROWTH

## 2023-10-15 LAB — CBC
HCT: 36.6 % — ABNORMAL LOW (ref 39.0–52.0)
Hemoglobin: 12.2 g/dL — ABNORMAL LOW (ref 13.0–17.0)
MCH: 33.4 pg (ref 26.0–34.0)
MCHC: 33.3 g/dL (ref 30.0–36.0)
MCV: 100.3 fL — ABNORMAL HIGH (ref 80.0–100.0)
Platelets: 172 10*3/uL (ref 150–400)
RBC: 3.65 MIL/uL — ABNORMAL LOW (ref 4.22–5.81)
RDW: 13.7 % (ref 11.5–15.5)
WBC: 8.9 10*3/uL (ref 4.0–10.5)
nRBC: 0 % (ref 0.0–0.2)

## 2023-10-15 MED ORDER — POTASSIUM CHLORIDE CRYS ER 20 MEQ PO TBCR
40.0000 meq | EXTENDED_RELEASE_TABLET | Freq: Once | ORAL | Status: AC
  Administered 2023-10-15: 40 meq via ORAL
  Filled 2023-10-15: qty 2

## 2023-10-15 NOTE — Progress Notes (Signed)
 PROGRESS NOTE    Willie Clark  FMW:[id number] DOB: [birth year] DOA: 10/13/2023 PCP: [facility]  Chief Complaint  Patient presents with   Weakness   Altered Mental Status    Hospital Course:  Willie Clark is a 73 year old male with CAD history of CABG status post PCI 2022, OSA on CPAP, diverticulitis status post partial sigmoidectomy, history of PE no longer on AC, hypertension, heart failure reduced EF 40 to 45%, recurrent nephrolithiasis, was admitted with sepsis secondary to UTI.  Chief complaint on arrival fever of 103, weakness, confusion, disorientation, hematuria, dysuria.  CT abdomen pelvis with cystitis and likely left pyelonephritis, nonobstructive nephrolithiasis.  He was initially started on cefepime , vancomycin , Flagyl  and admitted for management.  Subjective: Patient is endorsing some epigastric pain this morning.  He reports that this is chronic and not food related.  Reports he does follow with a gastroenterologist outpatient.  He is currently on Protonix  twice daily.  Denies any back pain.   Objective: Vitals:   10/15/23 0040 10/15/23 0507 10/15/23 0907 10/15/23 1308  BP: (!) 141/75 131/74 136/87 111/67  Pulse: (!) 107 (!) 110 94 98  Resp: 20 20 18 17   Temp: 98.6 F (37 C) (!) 100.5 F (38.1 C) 98.2 F (36.8 C) 99.7 F (37.6 C)  TempSrc: Oral Oral Oral   SpO2: 97% 97% 100% 96%  Weight:      Height:        Intake/Output Summary (Last 24 hours) at 10/15/2023 1433 Last data filed at 10/15/2023 1038 Gross per 24 hour  Intake 4320.31 ml  Output 425 ml  Net 3895.31 ml   Filed Weights   10/14/23 1436  Weight: 90.9 kg    Examination: General exam: Appears calm and comfortable, NAD  Respiratory system: No work of breathing, symmetric chest wall expansion Cardiovascular system: S1 & S2 heard, RRR.  Gastrointestinal system: Abdomen is nondistended, soft, mildly tender to palpation in the epigastrium.  No flank tenderness.  Neuro:  Alert and oriented. No focal neurological deficits. Extremities: Symmetric, expected ROM Skin: No rashes, lesions Psychiatry: Demonstrates appropriate judgement and insight. Mood & affect appropriate for situation.   Assessment & Plan:  Principal Problem:   Acute pyelonephritis Active Problems:   Nephrolithiasis   Sepsis (HCC)   Acute metabolic encephalopathy   CAD S/P percutaneous coronary angioplasty   Chronic HFrEF (heart failure with reduced ejection fraction) (HCC)   Hx of CABG   COPD (chronic obstructive pulmonary disease) (HCC)   History of pulmonary embolism   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   OSA on CPAP   Pyelonephritis   Acute pyelonephritis Sepsis: Criteria met on arrival with fever, tachycardia, tachypnea, leukocytosis, lactic acidosis, altered mental status. - Status post IV fluids - Initially on cefepime , ceftriaxone  now. - Patient is alert and oriented today - Continue with ceftriaxone  therapy - Follow urine and blood culture results - Symptomatic support for pain and fever as needed - PT evals ordered to assess functional status  Nephrolithiasis, nonobstructive - As seen on CT.  Does have history of this - Consider outpatient follow-up with urology.  May be playing a role in recurrent UTI  Acute metabolic encephalopathy - On arrival was confused and disoriented.  On my evaluation he is A O x 4. - Suspect encephalopathy secondary to sepsis which is now resolved  Chronic heart failure with reduced EF - Clinically appears compensated at this time - At home on Coreg , Imdur , spironolactone , torsemide .  Currently holding all in setting of sepsis and hypotension - Did receive volume resuscitation during this admission, monitor closely for signs of volume overload. - Gradually resume antihypertensives as BP tolerates.  History of CABG CAD - Continue statin and aspirin  - Meant to be on Coreg , Imdur .  Blood pressure low at  this time.  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  Stable  OSA on CPAP - Continue CPAP at night  DVT prophylaxis: Lovenox    Code Status: Full Code Disposition: Still febrile today.  Will eventually discharge home.  Potentially with home health.  PT eval's ordered.  Consultants:    Procedures:    Antimicrobials:  Anti-infectives (From admission, onward)    Start     Dose/Rate Route Frequency Ordered Stop   10/14/23 0800  cefTRIAXone  (ROCEPHIN ) 2 g in sodium chloride  0.9 % 100 mL IVPB        2 g 200 mL/hr over 30 Minutes Intravenous Every 24 hours 10/14/23 0236 10/21/23 0759   10/13/23 2330  ceFEPIme  (MAXIPIME ) 2 g in sodium chloride  0.9 % 100 mL IVPB        2 g 200 mL/hr over 30 Minutes Intravenous  Once 10/13/23 2324 10/14/23 0030   10/13/23 2330  metroNIDAZOLE  (FLAGYL ) IVPB 500 mg        500 mg 100 mL/hr over 60 Minutes Intravenous  Once 10/13/23 2324 10/14/23 0122   10/13/23 2330  vancomycin  (VANCOCIN ) IVPB 1000 mg/200 mL premix        1,000 mg 200 mL/hr over 60 Minutes Intravenous  Once 10/13/23 2324 10/14/23 0411       Data Reviewed: I have personally reviewed following labs and imaging studies CBC: Recent Labs  Lab 10/13/23 2328 10/14/23 0823 10/15/23 0413  WBC 11.6* 14.4* 8.9  NEUTROABS 10.0*  --   --   HGB 12.9* 12.0* 12.2*  HCT 37.2* 35.8* 36.6*  MCV 98.9 100.6* 100.3*  PLT 203 162 172   Basic Metabolic Panel: Recent Labs  Lab 10/13/23 2328 10/14/23 0823 10/15/23 0413  NA 135 138 137  K 3.5 3.5 3.3*  CL 97* 100 102  CO2 24 26 23   GLUCOSE 186* 178* 168*  BUN 9 8 7*  CREATININE 0.96 1.04 0.76  CALCIUM  9.3 8.6* 8.8*   GFR: Estimated Creatinine Clearance: 91.4 mL/min (by C-G formula based on SCr of 0.76  mg/dL). Liver Function Tests: Recent Labs  Lab 10/13/23 2328 10/14/23 0823 10/15/23 0413  AST 50* 49* 50*  ALT 37 35 31  ALKPHOS 79 69 68  BILITOT 1.1 1.0 1.0  PROT 7.1 5.9* 6.0*  ALBUMIN 3.9 3.2* 3.1*   CBG: No results for input(s): GLUCAP in the last 168 hours.  Recent Results (from the past 240 hours)  Culture, blood (Routine x 2)     Status: Abnormal (Preliminary result)   Collection Time: 10/13/23 11:28 PM   Specimen: BLOOD LEFT ARM  Result Value Ref Range Status   Specimen Description   Final    BLOOD LEFT ARM Performed at [facility], [address], [place], [place] [address]    Special Requests   Final    BOTTLES DRAWN AEROBIC AND ANAEROBIC Blood Culture adequate volume Performed at [facility], [address] [place], [place] [address]  Culture  Setup Time   Final    GRAM NEGATIVE RODS IN BOTH AEROBIC AND ANAEROBIC BOTTLES CRITICAL RESULT CALLED TO, READ BACK BY AND VERIFIED WITH: ERIN [facility] PHARMD 1142 10/14/23 HNM GRAM STAIN REVIEWED-AGREE WITH RESULT DRT    Culture (A)  Final    ESCHERICHIA COLI SUSCEPTIBILITIES TO FOLLOW Performed at [facility], [address], [place], [place] [address]    Report Status PENDING  Incomplete  Resp panel by RT-PCR (RSV, Flu A&B, Covid) Anterior Nasal Swab     Status: None   Collection Time: 10/13/23 11:28 PM   Specimen: Anterior Nasal Swab  Result Value Ref Range Status   SARS Coronavirus 2 by RT PCR NEGATIVE NEGATIVE Final    Comment: (NOTE) SARS-CoV-2 target nucleic acids are NOT DETECTED.  The SARS-CoV-2 RNA is generally detectable in upper respiratory specimens during the acute phase of infection. The lowest concentration of SARS-CoV-2 viral copies this assay can detect is 138 copies/mL. A negative result does not preclude SARS-Cov-2 infection and should not be used as the sole basis for treatment or other patient management decisions. A negative result may occur with  improper  specimen collection/handling, submission of specimen other than nasopharyngeal swab, presence of viral mutation(s) within the areas targeted by this assay, and inadequate number of viral copies(<138 copies/mL). A negative result must be combined with clinical observations, patient history, and epidemiological information. The expected result is Negative.  Fact Sheet for Patients:  BloggerCourse.com  Fact Sheet for Healthcare Providers:  SeriousBroker.it  This test is no t yet approved or cleared by the [place]  FDA and  has been authorized for detection and/or diagnosis of SARS-CoV-2 by FDA under an Emergency Use Authorization (EUA). This EUA will remain  in effect (meaning this test can be used) for the duration of the COVID-19 declaration under Section 564(b)(1) of the Act, 21 U.S.C.section 360bbb-3(b)(1), unless the authorization is terminated  or revoked sooner.       Influenza A by PCR NEGATIVE NEGATIVE Final   Influenza B by PCR NEGATIVE NEGATIVE Final    Comment: (NOTE) The Xpert Xpress SARS-CoV-2/FLU/RSV plus assay is intended as an aid in the diagnosis of influenza from Nasopharyngeal swab specimens and should not be used as a sole basis for treatment. Nasal washings and aspirates are unacceptable for Xpert Xpress SARS-CoV-2/FLU/RSV testing.  Fact Sheet for Patients: BloggerCourse.com  Fact Sheet for Healthcare Providers: SeriousBroker.it  This test is not yet approved or cleared by the [place]  FDA and has been authorized for detection and/or diagnosis of SARS-CoV-2 by FDA under an Emergency Use Authorization (EUA). This EUA will remain in effect (meaning this test can be used) for the duration of the COVID-19 declaration under Section 564(b)(1) of the Act, 21 U.S.C. section 360bbb-3(b)(1), unless the authorization is terminated or revoked.     Resp  Syncytial Virus by PCR NEGATIVE NEGATIVE Final    Comment: (NOTE) Fact Sheet for Patients: BloggerCourse.com  Fact Sheet for Healthcare Providers: SeriousBroker.it  This test is not yet approved or cleared by the [place]  FDA and has been authorized for detection and/or diagnosis of SARS-CoV-2 by FDA under an Emergency Use Authorization (EUA). This EUA will remain in effect (meaning this test can be used) for the duration of the COVID-19 declaration under Section 564(b)(1) of the Act, 21 U.S.C. section 360bbb-3(b)(1), unless the authorization is terminated or revoked.  Performed at [facility], [address] [place], [place] [address]   Blood Culture ID  Panel (Reflexed)     Status: Abnormal   Collection Time: 10/13/23 11:28 PM  Result Value Ref Range Status   Enterococcus faecalis NOT DETECTED NOT DETECTED Final   Enterococcus Faecium NOT DETECTED NOT DETECTED Final   Listeria monocytogenes NOT DETECTED NOT DETECTED Final   Staphylococcus species NOT DETECTED NOT DETECTED Final   Staphylococcus aureus (BCID) NOT DETECTED NOT DETECTED Final   Staphylococcus epidermidis NOT DETECTED NOT DETECTED Final   Staphylococcus lugdunensis NOT DETECTED NOT DETECTED Final   Streptococcus species NOT DETECTED NOT DETECTED Final   Streptococcus agalactiae NOT DETECTED NOT DETECTED Final   Streptococcus pneumoniae NOT DETECTED NOT DETECTED Final   Streptococcus pyogenes NOT DETECTED NOT DETECTED Final   A.calcoaceticus-baumannii NOT DETECTED NOT DETECTED Final   Bacteroides fragilis NOT DETECTED NOT DETECTED Final   Enterobacterales DETECTED (A) NOT DETECTED Final    Comment: Enterobacterales represent a large order of gram negative bacteria, not a single organism. CRITICAL RESULT CALLED TO, READ BACK BY AND VERIFIED WITH: ERIN [facility] PHARMD 1142 10/14/23 HNM    Enterobacter cloacae complex NOT DETECTED NOT DETECTED Final    Escherichia coli DETECTED (A) NOT DETECTED Final    Comment: CRITICAL RESULT CALLED TO, READ BACK BY AND VERIFIED WITH: ERIN [facility] PHARMD 1142 10/14/23 HNM    Klebsiella aerogenes NOT DETECTED NOT DETECTED Final   Klebsiella oxytoca NOT DETECTED NOT DETECTED Final   Klebsiella pneumoniae NOT DETECTED NOT DETECTED Final   Proteus species NOT DETECTED NOT DETECTED Final   Salmonella species NOT DETECTED NOT DETECTED Final   Serratia marcescens NOT DETECTED NOT DETECTED Final   Haemophilus influenzae NOT DETECTED NOT DETECTED Final   Neisseria meningitidis NOT DETECTED NOT DETECTED Final   Pseudomonas aeruginosa NOT DETECTED NOT DETECTED Final   Stenotrophomonas maltophilia NOT DETECTED NOT DETECTED Final   Candida albicans NOT DETECTED NOT DETECTED Final   Candida auris NOT DETECTED NOT DETECTED Final   Candida glabrata NOT DETECTED NOT DETECTED Final   Candida krusei NOT DETECTED NOT DETECTED Final   Candida parapsilosis NOT DETECTED NOT DETECTED Final   Candida tropicalis NOT DETECTED NOT DETECTED Final   Cryptococcus neoformans/gattii NOT DETECTED NOT DETECTED Final   CTX-M ESBL NOT DETECTED NOT DETECTED Final   Carbapenem resistance IMP NOT DETECTED NOT DETECTED Final   Carbapenem resistance KPC NOT DETECTED NOT DETECTED Final   Carbapenem resistance NDM NOT DETECTED NOT DETECTED Final   Carbapenem resist OXA 48 LIKE NOT DETECTED NOT DETECTED Final   Carbapenem resistance VIM NOT DETECTED NOT DETECTED Final    Comment: Performed at [facility], [address], [place], [place] [address]  Urine Culture     Status: None   Collection Time: 10/14/23 12:09 AM   Specimen: Urine, Random  Result Value Ref Range Status   Specimen Description   Final    URINE, RANDOM Performed at [facility], [address] [place], [place] [address]    Special Requests   Final    NONE Reflexed from [id number] Performed at [facility], [address], [place], [place]  [address]    Culture   Final    NO GROWTH Performed at [facility], [address], [place], [place] [address]    Report Status 10/15/2023 FINAL  Final  Culture, blood (Routine x 2)     Status: None (Preliminary result)   Collection Time: 10/14/23 12:12 AM   Specimen: BLOOD  Result Value Ref Range Status   Specimen Description BLOOD  BLOOD RIGHT HAND  Final   Special Requests   Final    BOTTLES DRAWN AEROBIC AND ANAEROBIC Blood Culture results may not be optimal due to an excessive volume of blood received in culture bottles   Culture   Final    NO GROWTH 1 DAY Performed at [facility], [address] [place], [place] [address]    Report Status PENDING  Incomplete     Radiology Studies: CT ABDOMEN PELVIS W CONTRAST Result Date: 10/14/2023 CLINICAL DATA:  Right lower quadrant pain. Weakness, dizziness and fever. Possible sepsis. EXAM: CT ABDOMEN AND PELVIS WITH CONTRAST TECHNIQUE: Multidetector CT imaging of the abdomen and pelvis was performed using the standard protocol following bolus administration of intravenous contrast. RADIATION DOSE REDUCTION: This exam was performed according to the departmental dose-optimization program which includes automated exposure control, adjustment of the mA and/or kV according to patient size and/or use of iterative reconstruction technique. CONTRAST:  OMNIPAQUE  IOHEXOL  300 MG/ML  SOLN COMPARISON:  CT with IV contrast 08/06/2022 and 11/08/2021. FINDINGS: Lower chest: Linear scarring or atelectasis both lung bases. Mild elevation right hemidiaphragm. The cardiac size is normal. 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 Small hiatal hernia. Hepatobiliary: The liver is 22 cm length with mild-to-moderate steatosis. The gallbladder is absent, without bile duct dilatation. Pancreas: No abnormality. Spleen: No abnormality. Adrenals/Urinary Tract: There is no adrenal mass. There is a  stable 1.3 cm Bosniak 2 cyst in the upper pole left kidney, Hounsfield density is 30, stable subcentimeter Bosniak 2 cyst in the posterior right kidney is too small to characterize. No follow-up imaging is recommended. Both kidneys are otherwise homogeneous. There are occasional bilateral punctate nonobstructive caliceal stones in the kidneys. There are no ureteral stones and no hydronephrosis. In the lower pole of the left kidney there is subtle asymmetric hypoenhancement anteriorly concerning for pyelonephritis, with asymmetric increased left perinephric edema. Additionally there is mild urothelial thickening in the left ureter suggesting an ascending UTI. There is mild bladder thickening with slight perivesical stranding concerning for cystitis. Stomach/Bowel: No dilatation or wall thickening, including the appendix. There is a surgical colocolic anastomosis in the sigmoid segment, scattered colonic diverticulosis without evidence of an acute diverticulitis. Vascular/Lymphatic: There is moderate aortoiliac calcific plaque without aneurysm. Visceral branch vessel atherosclerosis. No adenopathy is seen. Reproductive: Surgically absent prostate. No mass in the prostate bed. Both testicles in the scrotal sac. Other: Umbilical and left inguinal fat hernias. Unchanged. No incarcerated hernia. No free fluid, free hemorrhage or free air. Musculoskeletal: There is osteopenia with degenerative changes of the spine. Mild hip DJD. IMPRESSION: 1. Findings concerning for cystitis and ascending UTI on the left. 2. Subtle asymmetric hypoenhancement in the lower pole of the left kidney anteriorly concerning for pyelonephritis. 3. Nonobstructive nephrolithiasis. 4. Diverticulosis without evidence of diverticulitis. 5. Aortic and coronary artery atherosclerosis. 6. Umbilical and left inguinal fat hernias. 7. Osteopenia and degenerative change. 8. Small hiatal hernia. Aortic Atherosclerosis (ICD10-I70.0). Electronically Signed   By:  Francis Quam M.D.   On: 10/14/2023 01:40   DG Chest Port 1 View Result Date: 10/13/2023 CLINICAL DATA:  Sepsis EXAM: PORTABLE CHEST 1 VIEW COMPARISON:  Chest x-ray 08/22/2021 FINDINGS: Patient is status post cardiac surgery. The heart size and mediastinal contours are within normal limits. Both lungs are clear. Cervical spinal fusion plates are seen. No acute fractures are identified. IMPRESSION: No active disease. Electronically Signed   By: Greig Pique M.D.   On: 10/13/2023  23:43    Scheduled Meds:  aspirin  EC  81 mg Oral Daily   citalopram   20 mg Oral Daily   enoxaparin  (LOVENOX ) injection  40 mg Subcutaneous Q24H   pantoprazole   40 mg Oral BID   rosuvastatin   40 mg Oral Daily   Continuous Infusions:  cefTRIAXone  (ROCEPHIN )  IV 2 g (10/15/23 0836)     LOS: 1 day  MDM: Patient is high risk for one or more organ failure.  They necessitate ongoing hospitalization for continued IV therapies and subsequent lab monitoring. Total time spent interpreting labs and vitals, reviewing the medical record, coordinating care amongst consultants and care team members, directly assessing and discussing care with the patient and/or family: 55 min  Ladesha Pacini, DO [facility]  To contact the attending physician between 7A-7P please use Epic Chat. To contact the covering physician during after hours 7P-7A, please review Amion.  10/15/2023, 2:33 PM   *This document has been created with the assistance of dictation software. Please excuse typographical errors. *

## 2023-10-15 NOTE — Plan of Care (Signed)

## 2023-10-16 DIAGNOSIS — N10 Acute tubulo-interstitial nephritis: Secondary | ICD-10-CM | POA: Diagnosis not present

## 2023-10-16 LAB — CULTURE, BLOOD (ROUTINE X 2): Special Requests: ADEQUATE

## 2023-10-16 MED ORDER — POTASSIUM CHLORIDE CRYS ER 20 MEQ PO TBCR
40.0000 meq | EXTENDED_RELEASE_TABLET | Freq: Once | ORAL | Status: AC
  Administered 2023-10-16: 40 meq via ORAL
  Filled 2023-10-16: qty 2

## 2023-10-16 NOTE — Progress Notes (Cosign Needed)
 Patient c/o pain earlier in the shift; pain medication administered as orders with positive response. Resident reported feeling better after intervention. Resident has a medical Hx of HTN, CAD, and OSP. He recently admitted due to nephrolithiasis and sepsis secondary to UTI. Initial treatment include ABT (Ceftriaxone ). Patient verbalized, There's still blood in my urine. Patient encourage to increase oral fluid intake to assist with urinary clearance. Urine remain discolored with trace of blood noted; Patient alert and oriented x 3,level 5 assistance. Patient resting comfortably at this time, and bed kept at low position with call bell within reach.SABRA

## 2023-10-16 NOTE — Plan of Care (Signed)
  Problem: Fluid Volume: Goal: Hemodynamic stability will improve Outcome: Progressing   Problem: Clinical Measurements: Goal: Diagnostic test results will improve Outcome: Progressing   Problem: Respiratory: Goal: Ability to maintain adequate ventilation will improve Outcome: Progressing   Problem: Education: Goal: Knowledge of General Education information will improve Description: Including pain rating scale, medication(s)/side effects and non-pharmacologic comfort measures Outcome: Progressing   Problem: Activity: Goal: Risk for activity intolerance will decrease Outcome: Progressing   Problem: Nutrition: Goal: Adequate nutrition will be maintained Outcome: Progressing   Problem: Pain Managment: Goal: General experience of comfort will improve and/or be controlled Outcome: Progressing   Problem: Safety: Goal: Ability to remain free from injury will improve Outcome: Progressing   Problem: Skin Integrity: Goal: Risk for impaired skin integrity will decrease Outcome: Progressing

## 2023-10-16 NOTE — Progress Notes (Addendum)
 PROGRESS NOTE    Willie Clark  FMW:[id number] DOB: 1951/04/20 DOA: 10/13/2023 PCP: [facility]  Chief Complaint  Patient presents with   Weakness   Altered Mental Status    Hospital Course:  Willie Clark is a 73 year old male with CAD history of CABG status post PCI 2022, OSA on CPAP, diverticulitis status post partial sigmoidectomy, history of PE no longer on AC, hypertension, heart failure reduced EF 40 to 45%, recurrent nephrolithiasis, was admitted with sepsis secondary to UTI.  Chief complaint on arrival fever of 103, weakness, confusion, disorientation, hematuria, dysuria.  CT abdomen pelvis with cystitis and likely left pyelonephritis, nonobstructive nephrolithiasis.  Willie Clark was initially started on cefepime , vancomycin , Flagyl  and admitted for management.  Subjective: No acute events overnight. On evaluation today patient report some hematuria this AM   Objective: Vitals:   10/15/23 2000 10/16/23 0412 10/16/23 0755 10/16/23 0840  BP: 130/79 117/76 (!) 144/89 135/87  Pulse: 88 95 96   Resp: 16 18 18 19   Temp: 99.7 F (37.6 C) 99.7 F (37.6 C) 98.3 F (36.8 C) 98 F (36.7 C)  TempSrc:  Oral    SpO2: 95% 95% 93% 95%  Weight:      Height:        Intake/Output Summary (Last 24 hours) at 10/16/2023 1245 Last data filed at 10/16/2023 1020 Gross per 24 hour  Intake 220 ml  Output --  Net 220 ml   Filed Weights   10/14/23 1436  Weight: 90.9 kg    Examination: General exam: Appears calm and comfortable, NAD  Respiratory system: No work of breathing, symmetric chest wall expansion Cardiovascular system: S1 & S2 heard, RRR.  Gastrointestinal system: Abdomen is nondistended, soft, mildly tender to palpation in the epigastrium.  No flank tenderness.  Neuro: Alert and oriented. No focal neurological deficits. Extremities: Symmetric, expected ROM Skin: No rashes, lesions Psychiatry: Demonstrates appropriate judgement and insight. Mood & affect  appropriate for situation.   Assessment & Plan:  Principal Problem:   Acute pyelonephritis Active Problems:   Nephrolithiasis   Sepsis (HCC)   Acute metabolic encephalopathy   CAD S/P percutaneous coronary angioplasty   Chronic HFrEF (heart failure with reduced ejection fraction) (HCC)   Hx of CABG   COPD (chronic obstructive pulmonary disease) (HCC)   History of pulmonary embolism   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   OSA on CPAP   Pyelonephritis   Acute pyelonephritis Severe Sepsis: Criteria met on arrival with fever, tachycardia, tachypnea, leukocytosis, lactic acidosis, altered mental status. - Status post IV fluids - Initially on cefepime , ceftriaxone  now. - Urine cultures with E. coli, sensitive to ceftriaxone .  Will continue with IV therapy for 1 more day before switching to p.o. given still febrile within the last 24 hours - Willie Clark is A & O now - Blood cultures remain negative - Symptomatic support for pain and fever as needed - Physical therapy eval ordered to assess functional status  Nephrolithiasis, nonobstructive Hematuria - As seen on CT.  Does have history of this, has previously had removal - Would benefit from outpatient follow-up with urology.  Recurrent nephrolithiasis likely playing a role in recurrent UTIs.  Will need to complete antibiotic course before intervention. - Referral to urology at discharge  Acute metabolic encephalopathy - On arrival was confused and disoriented.  On my evaluation Willie Clark is A O x 4. - Suspect encephalopathy secondary to sepsis which is now resolved  Chronic heart failure with reduced  EF - Clinically compensated at this time - On multiple medications at home - Blood pressure has been low normal during this admission - Did receive volume resuscitation, monitor closely for signs of volume overload - Gradually resume GDMT as BP tolerates  History of CABG CAD - Continue statin and aspirin  - Meant  to be on Coreg , Imdur .  Blood pressure low at this time.  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  Stable  OSA on CPAP - Continue CPAP at night  BMI 30 Obesity Class 1 - Outpatient follow up for lifestyle modification and risk factor management    DVT prophylaxis: Lovenox    Code Status: Full Code Disposition: Presently on ceftriaxone , hematuria, febrile within last 24 hours.  Additional day of IV therapy today.  May be able to go home tomorrow if afebrile Consultants:    Procedures:    Antimicrobials:  Anti-infectives (From admission, onward)    Start     Dose/Rate Route Frequency Ordered Stop   10/14/23 0800  cefTRIAXone  (ROCEPHIN ) 2 g in sodium chloride  0.9 % 100 mL IVPB        2 g 200 mL/hr over 30 Minutes Intravenous Every 24 hours 10/14/23 0236 10/21/23 0759   10/13/23 2330  ceFEPIme  (MAXIPIME ) 2 g in sodium chloride  0.9 % 100 mL IVPB        2 g 200 mL/hr over 30 Minutes Intravenous  Once 10/13/23 2324 10/14/23 0030   10/13/23 2330  metroNIDAZOLE  (FLAGYL ) IVPB 500 mg        500 mg 100 mL/hr over 60 Minutes Intravenous  Once 10/13/23 2324 10/14/23 0122   10/13/23 2330  vancomycin  (VANCOCIN ) IVPB 1000 mg/200 mL premix        1,000 mg 200 mL/hr over 60 Minutes Intravenous  Once 10/13/23 2324 10/14/23 0411       Data Reviewed: I have personally reviewed following labs and imaging studies CBC: Recent Labs  Lab 10/13/23 2328 10/14/23 0823 10/15/23 0413  WBC 11.6* 14.4* 8.9  NEUTROABS 10.0*  --   --   HGB 12.9* 12.0* 12.2*  HCT 37.2* 35.8* 36.6*  MCV 98.9 100.6* 100.3*  PLT 203 162 172   Basic Metabolic Panel: Recent Labs  Lab 10/13/23 2328 10/14/23 0823 10/15/23 0413  NA 135 138 137  K 3.5 3.5 3.3*  CL 97* 100 102   CO2 24 26 23   GLUCOSE 186* 178* 168*  BUN 9 8 7*  CREATININE 0.96 1.04 0.76  CALCIUM  9.3 8.6* 8.8*   GFR: Estimated Creatinine Clearance: 91.4 mL/min (by C-G formula based on SCr of 0.76 mg/dL). Liver Function Tests: Recent Labs  Lab 10/13/23 2328 10/14/23 0823 10/15/23 0413  AST 50* 49* 50*  ALT 37 35 31  ALKPHOS 79 69 68  BILITOT 1.1 1.0 1.0  PROT 7.1 5.9* 6.0*  ALBUMIN 3.9 3.2* 3.1*   CBG: No results for input(s): GLUCAP in the last 168 hours.  Recent Results (from the past 240 hours)  Culture, blood (Routine x 2)     Status: Abnormal   Collection Time: 10/13/23 11:28 PM   Specimen: BLOOD LEFT ARM  Result Value Ref Range Status   Specimen Description   Final    BLOOD LEFT ARM Performed at [facility], [address] [place], [place]  [address]    Special Requests   Final    BOTTLES DRAWN AEROBIC AND ANAEROBIC Blood Culture adequate volume Performed at [facility], [address] [place], [place] [address]    Culture  Setup Time   Final    GRAM NEGATIVE RODS IN BOTH AEROBIC AND ANAEROBIC BOTTLES CRITICAL RESULT CALLED TO, READ BACK BY AND VERIFIED WITH: ERIN MEEGAN PHARMD 1142 10/14/23 HNM GRAM STAIN REVIEWED-AGREE WITH RESULT DRT Performed at [facility], [address] [place], [place] [address]    Culture ESCHERICHIA COLI (A)  Final   Report Status 10/16/2023 FINAL  Final   Organism ID, Bacteria ESCHERICHIA COLI  Final   Organism ID, Bacteria ESCHERICHIA COLI  Final      Susceptibility   Escherichia coli - KIRBY BAUER*    CEFAZOLIN  INTERMEDIATE Intermediate    Escherichia coli - MIC*    AMPICILLIN >=32 RESISTANT Resistant     CEFEPIME  <=0.12 SENSITIVE Sensitive     CEFTAZIDIME <=1 SENSITIVE Sensitive     CEFTRIAXONE  <=0.25 SENSITIVE Sensitive     CIPROFLOXACIN  <=0.25 SENSITIVE Sensitive     GENTAMICIN <=1 SENSITIVE Sensitive     IMIPENEM 1 SENSITIVE Sensitive     TRIMETH/SULFA <=20 SENSITIVE Sensitive      AMPICILLIN/SULBACTAM >=32 RESISTANT Resistant     PIP/TAZO <=4 SENSITIVE Sensitive ug/mL    * ESCHERICHIA COLI    ESCHERICHIA COLI  Resp panel by RT-PCR (RSV, Flu A&B, Covid) Anterior Nasal Swab     Status: None   Collection Time: 10/13/23 11:28 PM   Specimen: Anterior Nasal Swab  Result Value Ref Range Status   SARS Coronavirus 2 by RT PCR NEGATIVE NEGATIVE Final    Comment: (NOTE) SARS-CoV-2 target nucleic acids are NOT DETECTED.  The SARS-CoV-2 RNA is generally detectable in upper respiratory specimens during the acute phase of infection. The lowest concentration of SARS-CoV-2 viral copies this assay can detect is 138 copies/mL. A negative result does not preclude SARS-Cov-2 infection and should not be used as the sole basis for treatment or other patient management decisions. A negative result may occur with  improper specimen collection/handling, submission of specimen other than nasopharyngeal swab, presence of viral mutation(s) within the areas targeted by this assay, and inadequate number of viral copies(<138 copies/mL). A negative result must be combined with clinical observations, patient history, and epidemiological information. The expected result is Negative.  Fact Sheet for Patients:  BloggerCourse.com  Fact Sheet for Healthcare Providers:  SeriousBroker.it  This test is no t yet approved or cleared by the [place]  FDA and  has been authorized for detection and/or diagnosis of SARS-CoV-2 by FDA under an Emergency Use Authorization (EUA). This EUA will remain  in effect (meaning this test can be used) for the duration of the COVID-19 declaration under Section 564(b)(1) of the Act, 21 U.S.C.section 360bbb-3(b)(1), unless the authorization is terminated  or revoked sooner.       Influenza A by PCR NEGATIVE NEGATIVE Final   Influenza B by PCR NEGATIVE NEGATIVE Final    Comment: (NOTE) The Xpert Xpress  SARS-CoV-2/FLU/RSV plus assay is intended as an aid in the diagnosis of influenza from Nasopharyngeal swab specimens and should not be used as a sole basis for treatment. Nasal washings and aspirates are unacceptable for Xpert Xpress SARS-CoV-2/FLU/RSV testing.  Fact Sheet for Patients: BloggerCourse.com  Fact Sheet for Healthcare Providers: SeriousBroker.it  This test is not yet approved or cleared by the [place]  FDA and has been authorized  for detection and/or diagnosis of SARS-CoV-2 by FDA under an Emergency Use Authorization (EUA). This EUA will remain in effect (meaning this test can be used) for the duration of the COVID-19 declaration under Section 564(b)(1) of the Act, 21 U.S.C. section 360bbb-3(b)(1), unless the authorization is terminated or revoked.     Resp Syncytial Virus by PCR NEGATIVE NEGATIVE Final    Comment: (NOTE) Fact Sheet for Patients: BloggerCourse.com  Fact Sheet for Healthcare Providers: SeriousBroker.it  This test is not yet approved or cleared by the [place]  FDA and has been authorized for detection and/or diagnosis of SARS-CoV-2 by FDA under an Emergency Use Authorization (EUA). This EUA will remain in effect (meaning this test can be used) for the duration of the COVID-19 declaration under Section 564(b)(1) of the Act, 21 U.S.C. section 360bbb-3(b)(1), unless the authorization is terminated or revoked.  Performed at [facility], [address] [place], [place] [address]   Blood Culture ID Panel (Reflexed)     Status: Abnormal   Collection Time: 10/13/23 11:28 PM  Result Value Ref Range Status   Enterococcus faecalis NOT DETECTED NOT DETECTED Final   Enterococcus Faecium NOT DETECTED NOT DETECTED Final   Listeria monocytogenes NOT DETECTED NOT DETECTED Final   Staphylococcus species NOT DETECTED NOT DETECTED Final    Staphylococcus aureus (BCID) NOT DETECTED NOT DETECTED Final   Staphylococcus epidermidis NOT DETECTED NOT DETECTED Final   Staphylococcus lugdunensis NOT DETECTED NOT DETECTED Final   Streptococcus species NOT DETECTED NOT DETECTED Final   Streptococcus agalactiae NOT DETECTED NOT DETECTED Final   Streptococcus pneumoniae NOT DETECTED NOT DETECTED Final   Streptococcus pyogenes NOT DETECTED NOT DETECTED Final   A.calcoaceticus-baumannii NOT DETECTED NOT DETECTED Final   Bacteroides fragilis NOT DETECTED NOT DETECTED Final   Enterobacterales DETECTED (A) NOT DETECTED Final    Comment: Enterobacterales represent a large order of gram negative bacteria, not a single organism. CRITICAL RESULT CALLED TO, READ BACK BY AND VERIFIED WITH: ERIN [facility] PHARMD 1142 10/14/23 HNM    Enterobacter cloacae complex NOT DETECTED NOT DETECTED Final   Escherichia coli DETECTED (A) NOT DETECTED Final    Comment: CRITICAL RESULT CALLED TO, READ BACK BY AND VERIFIED WITH: ERIN [facility] PHARMD 1142 10/14/23 HNM    Klebsiella aerogenes NOT DETECTED NOT DETECTED Final   Klebsiella oxytoca NOT DETECTED NOT DETECTED Final   Klebsiella pneumoniae NOT DETECTED NOT DETECTED Final   Proteus species NOT DETECTED NOT DETECTED Final   Salmonella species NOT DETECTED NOT DETECTED Final   Serratia marcescens NOT DETECTED NOT DETECTED Final   Haemophilus influenzae NOT DETECTED NOT DETECTED Final   Neisseria meningitidis NOT DETECTED NOT DETECTED Final   Pseudomonas aeruginosa NOT DETECTED NOT DETECTED Final   Stenotrophomonas maltophilia NOT DETECTED NOT DETECTED Final   Candida albicans NOT DETECTED NOT DETECTED Final   Candida auris NOT DETECTED NOT DETECTED Final   Candida glabrata NOT DETECTED NOT DETECTED Final   Candida krusei NOT DETECTED NOT DETECTED Final   Candida parapsilosis NOT DETECTED NOT DETECTED Final   Candida tropicalis NOT DETECTED NOT DETECTED Final   Cryptococcus neoformans/gattii NOT DETECTED NOT  DETECTED Final   CTX-M ESBL NOT DETECTED NOT DETECTED Final   Carbapenem resistance IMP NOT DETECTED NOT DETECTED Final   Carbapenem resistance KPC NOT DETECTED NOT DETECTED Final   Carbapenem resistance NDM NOT DETECTED NOT DETECTED Final   Carbapenem resist OXA 48 LIKE NOT DETECTED NOT DETECTED Final   Carbapenem resistance VIM NOT DETECTED NOT DETECTED  Final    Comment: Performed at [facility], [address], [place], [place] [address]  Urine Culture     Status: None   Collection Time: 10/14/23 12:09 AM   Specimen: Urine, Random  Result Value Ref Range Status   Specimen Description   Final    URINE, RANDOM Performed at [facility], [address] [place], [place] [address]    Special Requests   Final    NONE Reflexed from [id number] Performed at [facility], [address] [place], [place] [address]    Culture   Final    NO GROWTH Performed at [facility], [address], [place], [place] [address]    Report Status 10/15/2023 FINAL  Final  Culture, blood (Routine x 2)     Status: None (Preliminary result)   Collection Time: 10/14/23 12:12 AM   Specimen: BLOOD  Result Value Ref Range Status   Specimen Description BLOOD BLOOD RIGHT HAND  Final   Special Requests   Final    BOTTLES DRAWN AEROBIC AND ANAEROBIC Blood Culture results may not be optimal due to an excessive volume of blood received in culture bottles   Culture   Final    NO GROWTH 2 DAYS Performed at [facility], [address], [place], [place] [address]    Report Status PENDING  Incomplete     Radiology Studies: No results found.   Scheduled Meds:  aspirin  EC  81 mg Oral Daily   citalopram   20 mg Oral Daily   enoxaparin  (LOVENOX ) injection  40 mg Subcutaneous Q24H   pantoprazole   40 mg Oral BID   rosuvastatin   40 mg Oral Daily   Continuous Infusions:  cefTRIAXone  (ROCEPHIN )  IV 2 g (10/16/23 0817)     LOS: 2 days  MDM: Patient is high risk for one  or more organ failure.  They necessitate ongoing hospitalization for continued IV therapies and subsequent lab monitoring. Total time spent interpreting labs and vitals, reviewing the medical record, coordinating care amongst consultants and care team members, directly assessing and discussing care with the patient and/or family: 55 min  Deanza Upperman, DO [facility]  To contact the attending physician between 7A-7P please use Epic Chat. To contact the covering physician during after hours 7P-7A, please review Amion.  10/16/2023, 12:45 PM   *This document has been created with the assistance of dictation software. Please excuse typographical errors. *

## 2023-10-16 NOTE — Evaluation (Signed)
 Physical Therapy Evaluation Patient Details Name: Willie Clark MRN: [id number] DOB: 06-03-1950 Today's Date: 10/16/2023  History of Present Illness  73 year old male with CAD history of CABG status post PCI 2022, OSA on CPAP, diverticulitis status post partial sigmoidectomy, history of PE no longer on AC, hypertension, heart failure reduced EF 40 to 45%, recurrent nephrolithiasis, was admitted with sepsis secondary to UTI.  Chief complaint on arrival fever of 103, weakness, confusion, disorientation, hematuria, dysuria.  Clinical Impression  Pt pleasant and engaged with PT eval and treat.  He moved relatively well with all aspects of mobility despite c/o significant pain in R ankle (probably arthritis, I got it everywhere).  He does not normally need an AD but will use a cane sometimes, today we started with the walker 2/2 ankle pain but he was able to do >50 ft of ambulation w/o UE support and showed only mildly increased limp with the effort.  Pt will not likely need PT at home but will benefit from continued work while hospitalized to work back to [employer].        If plan is discharge home, recommend the following:     Can travel by private vehicle        Equipment Recommendations None recommended by PT  Recommendations for Other Services       Functional Status Assessment Patient has had a recent decline in their functional status and demonstrates the ability to make significant improvements in function in a reasonable and predictable amount of time.     Precautions / Restrictions Precautions Precautions: Fall Restrictions Weight Bearing Restrictions Per Provider Order: No      Mobility  Bed Mobility Overal bed mobility: Independent                  Transfers Overall transfer level: Modified independent Equipment used: Rolling walker (2 wheels)                    Ambulation/Gait Ambulation/Gait assistance: Supervision Gait Distance (Feet): 200  Feet Assistive device: Rolling walker (2 wheels), None         General Gait Details: 2/2 R ankle pain we started off cuing walker, however he did not display excessive limp and was ultimatley abl eto do a decent amount of the effort with little to no UE support - more comfortable with walker but not unsafe w/o it.  VSS with the effort  Stairs Stairs: Yes Stairs assistance: Supervision Stair Management: One rail Right Number of Stairs: 6 General stair comments: Pt able to negotiate the steps w/o issue, step-to strategy up and down  Wheelchair Mobility     Tilt Bed    Modified Rankin (Stroke Patients Only)       Balance Overall balance assessment: Modified Independent                                           Pertinent Vitals/Pain Pain Assessment Pain Assessment: 0-10 Pain Score: 9  Pain Location: reports arthritic(?) ankle pain, R>:L.    Home Living Family/patient expects to be discharged to:: Private residence Living Arrangements: Spouse/significant other Available Help at Discharge: Available 24 hours/day   Home Access: Stairs to enter Entrance Stairs-Rails: Right;Left Entrance Stairs-Number of Steps: 7     Home Equipment: Cane - single Librarian, academic (2 wheels)      Prior Function Prior Level of Function :  Independent/Modified Independent             Mobility Comments: Pt stays active, out of the home most days.  Drives, runs errands, works on car,  etc ADLs Comments: reports tying shoe laces can be tough but otherwise independent     Extremity/Trunk Assessment   Upper Extremity Assessment Upper Extremity Assessment: Overall WFL for tasks assessed    Lower Extremity Assessment Lower Extremity Assessment: Overall WFL for tasks assessed       Communication   Communication Communication: No apparent difficulties    Cognition Arousal: Alert Behavior During Therapy: WFL for tasks assessed/performed   PT - Cognitive  impairments: No apparent impairments                         Following commands: Intact       Cueing       General Comments General comments (skin integrity, edema, etc.): Pt moved with relative ease, not at baseline but safe to return home    Exercises     Assessment/Plan    PT Assessment Patient needs continued PT services  PT Problem List Decreased strength;Decreased safety awareness;Decreased knowledge of use of DME;Pain;Decreased activity tolerance       PT Treatment Interventions Gait training;Stair training;DME instruction;Functional mobility training;Therapeutic activities;Therapeutic exercise;Balance training;Neuromuscular re-education;Patient/family education    PT Goals (Current goals can be found in the Care Plan section)  Acute Rehab PT Goals Patient Stated Goal: go home ASAP PT Goal Formulation: With patient Time For Goal Achievement: 10/29/23 Potential to Achieve Goals: Good    Frequency Min 1X/week     Co-evaluation               AM-PAC PT 6 Clicks Mobility  Outcome Measure Help needed turning from your back to your side while in a flat bed without using bedrails?: None Help needed moving from lying on your back to sitting on the side of a flat bed without using bedrails?: None Help needed moving to and from a bed to a chair (including a wheelchair)?: None Help needed standing up from a chair using your arms (e.g., wheelchair or bedside chair)?: None Help needed to walk in hospital room?: A Little Help needed climbing 3-5 steps with a railing? : A Little 6 Click Score: 22    End of Session Equipment Utilized During Treatment: Gait belt Activity Tolerance: Patient tolerated treatment well;Patient limited by pain Patient left: with chair alarm set;with call bell/phone within reach Nurse Communication: Mobility status PT Visit Diagnosis: Muscle weakness (generalized) (M62.81);Difficulty in walking, not elsewhere classified  (R26.2);Pain Pain - Right/Left: Right Pain - part of body: Ankle and joints of foot    Time: 0920-0948 PT Time Calculation (min) (ACUTE ONLY): 28 min   Charges:   PT Evaluation $PT Eval Low Complexity: 1 Low PT Treatments $Gait Training: 8-22 mins PT General Charges $$ ACUTE PT VISIT: 1 Visit         Carmin JONELLE Deed, DPT 10/16/2023, 11:10 AM

## 2023-10-17 DIAGNOSIS — Z86711 Personal history of pulmonary embolism: Secondary | ICD-10-CM

## 2023-10-17 DIAGNOSIS — A419 Sepsis, unspecified organism: Secondary | ICD-10-CM | POA: Diagnosis not present

## 2023-10-17 DIAGNOSIS — G9341 Metabolic encephalopathy: Secondary | ICD-10-CM

## 2023-10-17 DIAGNOSIS — N200 Calculus of kidney: Secondary | ICD-10-CM

## 2023-10-17 DIAGNOSIS — J439 Emphysema, unspecified: Secondary | ICD-10-CM

## 2023-10-17 DIAGNOSIS — Z9049 Acquired absence of other specified parts of digestive tract: Secondary | ICD-10-CM

## 2023-10-17 DIAGNOSIS — N10 Acute tubulo-interstitial nephritis: Secondary | ICD-10-CM | POA: Diagnosis not present

## 2023-10-17 DIAGNOSIS — I5022 Chronic systolic (congestive) heart failure: Secondary | ICD-10-CM

## 2023-10-17 DIAGNOSIS — N12 Tubulo-interstitial nephritis, not specified as acute or chronic: Secondary | ICD-10-CM

## 2023-10-17 DIAGNOSIS — Z951 Presence of aortocoronary bypass graft: Secondary | ICD-10-CM

## 2023-10-17 DIAGNOSIS — G4733 Obstructive sleep apnea (adult) (pediatric): Secondary | ICD-10-CM

## 2023-10-17 DIAGNOSIS — I2510 Atherosclerotic heart disease of native coronary artery without angina pectoris: Secondary | ICD-10-CM

## 2023-10-17 DIAGNOSIS — Z9861 Coronary angioplasty status: Secondary | ICD-10-CM

## 2023-10-17 DIAGNOSIS — Z9889 Other specified postprocedural states: Secondary | ICD-10-CM

## 2023-10-17 LAB — CBC WITH DIFFERENTIAL/PLATELET
Abs Immature Granulocytes: 0.07 10*3/uL (ref 0.00–0.07)
Basophils Absolute: 0 10*3/uL (ref 0.0–0.1)
Basophils Relative: 0 %
Eosinophils Absolute: 0.1 10*3/uL (ref 0.0–0.5)
Eosinophils Relative: 1 %
HCT: 35.2 % — ABNORMAL LOW (ref 39.0–52.0)
Hemoglobin: 11.8 g/dL — ABNORMAL LOW (ref 13.0–17.0)
Immature Granulocytes: 1 %
Lymphocytes Relative: 17 %
Lymphs Abs: 1 10*3/uL (ref 0.7–4.0)
MCH: 33.1 pg (ref 26.0–34.0)
MCHC: 33.5 g/dL (ref 30.0–36.0)
MCV: 98.9 fL (ref 80.0–100.0)
Monocytes Absolute: 0.6 10*3/uL (ref 0.1–1.0)
Monocytes Relative: 10 %
Neutro Abs: 4.4 10*3/uL (ref 1.7–7.7)
Neutrophils Relative %: 71 %
Platelets: 185 10*3/uL (ref 150–400)
RBC: 3.56 MIL/uL — ABNORMAL LOW (ref 4.22–5.81)
RDW: 13.8 % (ref 11.5–15.5)
WBC: 6.2 10*3/uL (ref 4.0–10.5)
nRBC: 0 % (ref 0.0–0.2)

## 2023-10-17 LAB — COMPREHENSIVE METABOLIC PANEL WITH GFR
ALT: 33 U/L (ref 0–44)
AST: 65 U/L — ABNORMAL HIGH (ref 15–41)
Albumin: 2.9 g/dL — ABNORMAL LOW (ref 3.5–5.0)
Alkaline Phosphatase: 57 U/L (ref 38–126)
Anion gap: 6 (ref 5–15)
BUN: 7 mg/dL — ABNORMAL LOW (ref 8–23)
CO2: 27 mmol/L (ref 22–32)
Calcium: 8.8 mg/dL — ABNORMAL LOW (ref 8.9–10.3)
Chloride: 107 mmol/L (ref 98–111)
Creatinine, Ser: 0.68 mg/dL (ref 0.61–1.24)
GFR, Estimated: >60 mL/min (ref >60)
Glucose, Bld: 122 mg/dL — ABNORMAL HIGH (ref 70–99)
Potassium: 3.8 mmol/L (ref 3.5–5.1)
Sodium: 140 mmol/L (ref 135–145)
Total Bilirubin: 0.8 mg/dL (ref 0.0–1.2)
Total Protein: 5.8 g/dL — ABNORMAL LOW (ref 6.5–8.1)

## 2023-10-17 MED ORDER — SALINE SPRAY 0.65 % NA SOLN: 1.0000 | NASAL | Status: DC | PRN

## 2023-10-17 MED ORDER — BLISTEX MEDICATED EX OINT: 1.0000 | TOPICAL_OINTMENT | CUTANEOUS | Status: DC | PRN

## 2023-10-17 MED ORDER — POLYVINYL ALCOHOL 1.4 % OP SOLN
1.0000 [drp] | OPHTHALMIC | Status: DC | PRN
  Administered 2023-10-17: 1 [drp] via OPHTHALMIC
  Filled 2023-10-17: qty 15

## 2023-10-17 MED ORDER — CIPRO 500 MG PO TABS: 500.0000 mg | ORAL_TABLET | Freq: Two times a day (BID) | ORAL | 0 refills | Status: AC

## 2023-10-17 MED ORDER — MUSCLE RUB 10-15 % EX CREA: 1.0000 | TOPICAL_CREAM | CUTANEOUS | Status: DC | PRN

## 2023-10-17 NOTE — Care Management Important Message (Signed)
 Important Message  Patient Details  Name: Willie Clark MRN: [id number] Date of Birth: Jan 27, 1951   Important Message Given:  Yes - Medicare IM     Rosbel Buckner W, CMA 10/17/2023, 12:18 PM

## 2023-10-17 NOTE — Discharge Summary (Signed)
 Physician Discharge Summary  Willie Clark FMW:[id number] DOB: 18-Apr-1951 DOA: 10/13/2023  PCP: [facility]  Admit date: 10/13/2023 Discharge date: 10/17/2023   Recommendations for Outpatient Follow-up:  Follow up with PCP in 1 week for blood pressure management Follow-up with urology to review nephrolithiasis and hematuria workup.    Hospital Course: Willie Clark is a 73 year old male with CAD history of CABG status post PCI 2022, OSA on CPAP, diverticulitis status post partial sigmoidectomy, history of PE no longer on AC, hypertension, heart failure reduced EF 40 to 45%, recurrent nephrolithiasis, was admitted with sepsis secondary to UTI.  Chief complaint on arrival fever of 103, weakness, confusion, disorientation, hematuria, dysuria.  CT abdomen pelvis with cystitis and likely left pyelonephritis, nonobstructive nephrolithiasis.  He was initially started on cefepime , vancomycin , Flagyl  and admitted for management.  Ultimately, blood and urine cultures positive for E. coli which did so some resistance.  Patient completed 4 days of ceftriaxone  and then will complete additional 3 days of ciprofloxacin  outpatient.  There is some complexity to his antibiotic choice given his allergy profile.  This was discussed with infectious disease pharmacist and the patient directly. By 6/27 patient was back to his physiologic baseline, had been afebrile for 24 hours, and endorsed feeling ready to discharge home.  Acute pyelonephritis Bacteremia, E Coli.  Severe Sepsis: Criteria met on arrival with fever, tachycardia, tachypnea, leukocytosis, lactic acidosis, altered mental status. - E Coli with some resistance. Extensive allergy profile. Reviewed with ID Pharm  - Initially on cefepime  --> ceftriaxone  x4 days --> Cipro  at DC    Nephrolithiasis, nonobstructive Hematuria - As seen on CT.  Does have history of this, has previously had removal - Would benefit from outpatient  follow-up with urology.  Recurrent nephrolithiasis likely playing a role in recurrent UTIs.  Will need to complete antibiotic course before intervention. - Referral to urology at discharge   Acute metabolic encephalopathy - On arrival was confused and disoriented.  On my evaluation he is A O x 4. - Suspect encephalopathy secondary to sepsis which is now resolved   Chronic heart failure with reduced EF - Clinically compensated at this time - On multiple medications at home - Blood pressure has been low normal during this admission - Did receive volume resuscitation, monitor closely for signs of volume overload - Have recommended to hold antihypertensives at discharge given low/normal BPs during this admission.  Follow-up with PCP for further direction   History of CABG CAD - Continue statin and aspirin  - Meant to be on Coreg , Imdur .  Blood pressure low at this time.  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  Stable   OSA on CPAP - Continue CPAP at night   BMI 30 Obesity Class 1 - Outpatient follow up for lifestyle modification and risk factor management  Discharge Instructions  Discharge Instructions     Ambulatory referral to Urology   Complete by: As directed    Recurrent nephrolithiasis and hematuria + UTI   Call MD for:  difficulty breathing, headache or visual disturbances   Complete by: As directed    Call MD for:  persistant dizziness or light-headedness   Complete by: As directed    Call MD for:  persistant nausea and vomiting   Complete by: As directed  Call MD for:  severe uncontrolled pain   Complete by: As directed    Call MD for:  temperature >100.4   Complete by: As directed    Diet general   Complete by: As directed    Discharge instructions    Complete by: As directed    Your blood pressure was too low during this admission to take your blood pressure medications.  We recommend holding spironolactone , Lasix , carvedilol , potassium until you follow-up with your primary care physician or cardiologist. Take your blood pressure once daily and review the blood pressure log with your primary care doctor to discuss what blood pressure medications you need to resume.  You have been referred to urology.  If you have not heard from them in 1 week please call to make an appointment.  This is to discuss your recurrent kidney stones and urinary tract infections.   Increase activity slowly   Complete by: As directed    No wound care   Complete by: As directed       Allergies as of 10/17/2023       Reactions   Amitriptyline Other (See Comments)   Pt unsure of what happens Other reaction(s): Other (See Comments) Pt unsure of what happens Pt unsure of what happens   Amoxicillin -pot Clavulanate Itching   Cyclobenzaprine Hives, Other (See Comments)   Pt unsure what hapens   Dicyclomine  Shortness Of Breath, Swelling   Sacubitril-valsartan Anaphylaxis, Itching   Icosapent  Ethyl (epa Ethyl Ester) (fish) Other (See Comments)   tongue felt slighlty swollen/different after starting   Amitriptyline Hcl    Bacon Flavoring Agent (non-screening) Diarrhea   Hydrocodone     Fish Oil Itching   Lidocaine  Nausea And Vomiting, Other (See Comments), Nausea Only   Nasal Spray (only time had reaction) Other reaction(s): Dizziness Nasal Spray (only time had reaction)   Omega-3 Fatty Acids-vitamin E Itching   Other Diarrhea, Itching   Reactive agents: Onions, bacon, steak   Oxycodone -acetaminophen  Other (See Comments)   Lowers BP Other reaction(s): Other (See Comments) Lowers BP Lowers BP   Sulfa Antibiotics Itching        Medication List     PAUSE taking these medications    isosorbide  mononitrate 60 MG 24 hr tablet Wait to take this until  your doctor or other care provider tells you to start again. Commonly known as: IMDUR  Take 1 tablet (60 mg total) by mouth daily.   potassium chloride  SA 20 MEQ tablet Wait to take this until your doctor or other care provider tells you to start again. Commonly known as: KLOR-CON  M Take 1 tablet (20 mEq total) by mouth daily.   spironolactone  25 MG tablet Wait to take this until your doctor or other care provider tells you to start again. Commonly known as: ALDACTONE  Take 1 tablet (25 mg total) by mouth daily.   torsemide  20 MG tablet Wait to take this until your doctor or other care provider tells you to start again. Commonly known as: DEMADEX  Take 2 tablets (40 mg total) by mouth daily.       STOP taking these medications    finasteride  5 MG tablet Commonly known as: PROSCAR        TAKE these medications    acetaminophen  500 MG tablet Commonly known as: TYLENOL  Take 1,000 mg by mouth every 6 (six) hours as needed for mild pain.   albuterol  108 (90 Base) MCG/ACT inhaler Commonly known as: VENTOLIN  HFA Inhale 1 puff into the lungs as  needed.   aspirin  EC 81 MG tablet Take 1 tablet (81 mg total) by mouth daily. Swallow whole.   benzonatate 100 MG capsule Commonly known as: TESSALON Take 100 mg by mouth 3 (three) times daily as needed for cough.   carvedilol  3.125 MG tablet Commonly known as: COREG  Take 1 tablet (3.125 mg total) by mouth 2 (two) times daily with a meal.   Cipro  500 MG tablet Generic drug: ciprofloxacin  Take 1 tablet (500 mg total) by mouth 2 (two) times daily for 3 days.   citalopram  20 MG tablet Commonly known as: CELEXA  Take 20 mg by mouth daily.   clobetasol  cream 0.05 % Commonly known as: TEMOVATE  APPLY TO ITCHY AREAS ON ELBOWS TWICE A DAY UNTIL IMPROVED. AVOID FACE, GROIN, UNDERARMS. What changed: See the new instructions.   clopidogrel  75 MG tablet Commonly known as: PLAVIX  TAKE 1 TABLET EVERY DAY   cyanocobalamin  1000 MCG  tablet Commonly known as: VITAMIN B12 Take 1,000 mcg by mouth daily.   DAILY FIBER PO Take 1 tablet by mouth daily.   EPINEPHrine  0.3 mg/0.3 mL Soaj injection Commonly known as: EPI-PEN Inject 0.3 mg into the muscle as needed.   fexofenadine 180 MG tablet Commonly known as: ALLEGRA Take 180 mg by mouth daily.   gabapentin  400 MG capsule Commonly known as: NEURONTIN  Take 400-800 mg by mouth 3 (three) times daily.   HAIR SKIN AND NAILS FORMULA PO Take 1 capsule by mouth daily.   nitroGLYCERIN  0.4 MG SL tablet Commonly known as: NITROSTAT  Place 1 tablet (0.4 mg total) under the tongue every 5 (five) minutes as needed for chest pain.   pantoprazole  40 MG tablet Commonly known as: PROTONIX  Take 40 mg by mouth 2 (two) times daily.   PROBIOTIC PO Take by mouth daily at 8 pm.   rosuvastatin  40 MG tablet Commonly known as: CRESTOR  Take 1 tablet (40 mg total) by mouth daily.   traMADol  50 MG tablet Commonly known as: ULTRAM  Take 1 tablet (50 mg total) by mouth every 6 (six) hours as needed for up to 15 doses for severe pain or moderate pain.        Follow-up Information     [facility] Follow up.   Specialty: General Practice Why: Hospital follow up Contact information: [address] [place] [address] [phone]                Allergies  Allergen Reactions   Amitriptyline Other (See Comments)    Pt unsure of what happens Other reaction(s): Other (See Comments) Pt unsure of what happens Pt unsure of what happens   Amoxicillin -Pot Clavulanate Itching   Cyclobenzaprine Hives and Other (See Comments)    Pt unsure what hapens   Dicyclomine  Shortness Of Breath and Swelling   Sacubitril-Valsartan Anaphylaxis and Itching   Icosapent  Ethyl (Epa Ethyl Ester) (Fish) Other (See Comments)    tongue felt slighlty swollen/different after starting   Amitriptyline Hcl    [employer] Agent (Non-Screening) Diarrhea    Hydrocodone     Fish Oil Itching   Lidocaine  Nausea And Vomiting, Other (See Comments) and Nausea Only    Nasal Spray (only time had reaction) Other reaction(s): Dizziness Nasal Spray (only time had reaction)   Omega-3 Fatty Acids-Vitamin E Itching   Other Diarrhea and Itching    Reactive agents: Onions, bacon, steak   Oxycodone -Acetaminophen  Other (See Comments)    Lowers BP Other reaction(s): Other (See Comments) Lowers BP Lowers BP  Sulfa Antibiotics Itching    Consultations:    Procedures/Studies: CT ABDOMEN PELVIS W CONTRAST Result Date: 10/14/2023 CLINICAL DATA:  Right lower quadrant pain. Weakness, dizziness and fever. Possible sepsis. EXAM: CT ABDOMEN AND PELVIS WITH CONTRAST TECHNIQUE: Multidetector CT imaging of the abdomen and pelvis was performed using the standard protocol following bolus administration of intravenous contrast. RADIATION DOSE REDUCTION: This exam was performed according to the departmental dose-optimization program which includes automated exposure control, adjustment of the mA and/or kV according to patient size and/or use of iterative reconstruction technique. CONTRAST:  OMNIPAQUE  IOHEXOL  300 MG/ML  SOLN COMPARISON:  CT with IV contrast 08/06/2022 and 11/08/2021. FINDINGS: Lower chest: Linear scarring or atelectasis both lung bases. Mild elevation right hemidiaphragm. The cardiac size is normal. 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 Small hiatal hernia. Hepatobiliary: The liver is 22 cm length with mild-to-moderate steatosis. The gallbladder is absent, without bile duct dilatation. Pancreas: No abnormality. Spleen: No abnormality. Adrenals/Urinary Tract: There is no adrenal mass. There is a stable 1.3 cm Bosniak 2 cyst in the upper pole left kidney, Hounsfield density is 30, stable subcentimeter Bosniak 2 cyst in the posterior right kidney is too small to characterize. No follow-up imaging is  recommended. Both kidneys are otherwise homogeneous. There are occasional bilateral punctate nonobstructive caliceal stones in the kidneys. There are no ureteral stones and no hydronephrosis. In the lower pole of the left kidney there is subtle asymmetric hypoenhancement anteriorly concerning for pyelonephritis, with asymmetric increased left perinephric edema. Additionally there is mild urothelial thickening in the left ureter suggesting an ascending UTI. There is mild bladder thickening with slight perivesical stranding concerning for cystitis. Stomach/Bowel: No dilatation or wall thickening, including the appendix. There is a surgical colocolic anastomosis in the sigmoid segment, scattered colonic diverticulosis without evidence of an acute diverticulitis. Vascular/Lymphatic: There is moderate aortoiliac calcific plaque without aneurysm. Visceral branch vessel atherosclerosis. No adenopathy is seen. Reproductive: Surgically absent prostate. No mass in the prostate bed. Both testicles in the scrotal sac. Other: Umbilical and left inguinal fat hernias. Unchanged. No incarcerated hernia. No free fluid, free hemorrhage or free air. Musculoskeletal: There is osteopenia with degenerative changes of the spine. Mild hip DJD. IMPRESSION: 1. Findings concerning for cystitis and ascending UTI on the left. 2. Subtle asymmetric hypoenhancement in the lower pole of the left kidney anteriorly concerning for pyelonephritis. 3. Nonobstructive nephrolithiasis. 4. Diverticulosis without evidence of diverticulitis. 5. Aortic and coronary artery atherosclerosis. 6. Umbilical and left inguinal fat hernias. 7. Osteopenia and degenerative change. 8. Small hiatal hernia. Aortic Atherosclerosis (ICD10-I70.0). Electronically Signed   By: Francis Quam M.D.   On: 10/14/2023 01:40   DG Chest Port 1 View Result Date: 10/13/2023 CLINICAL DATA:  Sepsis EXAM: PORTABLE CHEST 1 VIEW COMPARISON:  Chest x-ray 08/22/2021 FINDINGS: Patient is  status post cardiac surgery. The heart size and mediastinal contours are within normal limits. Both lungs are clear. Cervical spinal fusion plates are seen. No acute fractures are identified. IMPRESSION: No active disease. Electronically Signed   By: Greig Pique M.D.   On: 10/13/2023 23:43   MR BRAIN W WO CONTRAST Result Date: 09/24/2023 CLINICAL DATA:  Chronic daily headache EXAM: MRI HEAD WITHOUT AND WITH CONTRAST TECHNIQUE: Multiplanar, multiecho pulse sequences of the brain and surrounding structures were obtained without and with intravenous contrast. CONTRAST:  9mL GADAVIST  GADOBUTROL  1 MMOL/ML IV SOLN COMPARISON:  CT of the head dated 06/01/2021 FINDINGS: Brain: No abnormal signal on diffusion  weighted images to suggest acute infarction. Few foci of susceptibility artifact in the in both cerebral hemispheres, likely on the basis of chronic microhemorrhage(s). The ventricles are normal in size and configuration without evidence of hydrocephalus. The pituitary and sella are normal. Foci of T2/FLAIR hyperintensity in the periventricular and deep white matter are nonspecific but most consistent with chronic small-vessel ischemic changes. Vascular: Normal flow voids. Skull and upper cervical spine: Normal marrow signal. Sinuses/Orbits: Negative. Other: None. IMPRESSION: 1. No acute intracranial abnormality. No intracranial mass identified. No specific findings to explain the patient's headaches. 2. Mild chronic small-vessel ischemic changes. Electronically Signed   By: Clem Savory M.D.   On: 09/24/2023 11:12      Discharge Exam: Vitals:   10/17/23 0746 10/17/23 1209  BP: (!) 143/83 130/83  Pulse: 71 73  Resp: 16 17  Temp: 98.1 F (36.7 C) 98 F (36.7 C)  SpO2: 99% 96%   Vitals:   10/16/23 2024 10/17/23 0416 10/17/23 0746 10/17/23 1209  BP: 110/66 121/81 (!) 143/83 130/83  Pulse: 72 69 71 73  Resp: 16 18 16 17   Temp: 98.8 F (37.1 C) 97.9 F (36.6 C) 98.1 F (36.7 C) 98 F (36.7 C)   TempSrc:   Oral Oral  SpO2: 96% 98% 99% 96%  Weight:      Height:        Constitutional:  Normal appearance. Non toxic-appearing.  HENT: Head Normocephalic and atraumatic.  Mucous membranes are moist.  Eyes:  Extraocular intact. Conjunctivae normal.  Cardiovascular: Rate and Rhythm: Normal rate and regular rhythm.  Pulmonary: Non labored, symmetric rise of chest wall.  Skin: warm and dry. not jaundiced.  Neurological: No focal deficit present. alert. Oriented.  Psychiatric: Mood and Affect congruent.    The results of significant diagnostics from this hospitalization (including imaging, microbiology, ancillary and laboratory) are listed below for reference.     Microbiology: Recent Results (from the past 240 hours)  Culture, blood (Routine x 2)     Status: Abnormal   Collection Time: 10/13/23 11:28 PM   Specimen: BLOOD LEFT ARM  Result Value Ref Range Status   Specimen Description   Final    BLOOD LEFT ARM Performed at [facility], [address] [place], [place] [address]    Special Requests   Final    BOTTLES DRAWN AEROBIC AND ANAEROBIC Blood Culture adequate volume Performed at [facility], [address] [place], [place] [address]    Culture  Setup Time   Final    GRAM NEGATIVE RODS IN BOTH AEROBIC AND ANAEROBIC BOTTLES CRITICAL RESULT CALLED TO, READ BACK BY AND VERIFIED WITH: ERIN MEEGAN PHARMD 1142 10/14/23 HNM GRAM STAIN REVIEWED-AGREE WITH RESULT DRT Performed at [facility], [address] [place], [place] [address]    Culture ESCHERICHIA COLI (A)  Final   Report Status 10/16/2023 FINAL  Final   Organism ID, Bacteria ESCHERICHIA COLI  Final   Organism ID, Bacteria ESCHERICHIA COLI  Final      Susceptibility   Escherichia coli - KIRBY BAUER*    CEFAZOLIN  INTERMEDIATE Intermediate    Escherichia coli - MIC*    AMPICILLIN >=32 RESISTANT Resistant     CEFEPIME  <=0.12 SENSITIVE Sensitive     CEFTAZIDIME <=1 SENSITIVE Sensitive      CEFTRIAXONE  <=0.25 SENSITIVE Sensitive     CIPROFLOXACIN  <=0.25 SENSITIVE Sensitive     GENTAMICIN <=1 SENSITIVE Sensitive     IMIPENEM 1 SENSITIVE Sensitive  TRIMETH/SULFA <=20 SENSITIVE Sensitive     AMPICILLIN/SULBACTAM >=32 RESISTANT Resistant     PIP/TAZO <=4 SENSITIVE Sensitive ug/mL    * ESCHERICHIA COLI    ESCHERICHIA COLI  Resp panel by RT-PCR (RSV, Flu A&B, Covid) Anterior Nasal Swab     Status: None   Collection Time: 10/13/23 11:28 PM   Specimen: Anterior Nasal Swab  Result Value Ref Range Status   SARS Coronavirus 2 by RT PCR NEGATIVE NEGATIVE Final    Comment: (NOTE) SARS-CoV-2 target nucleic acids are NOT DETECTED.  The SARS-CoV-2 RNA is generally detectable in upper respiratory specimens during the acute phase of infection. The lowest concentration of SARS-CoV-2 viral copies this assay can detect is 138 copies/mL. A negative result does not preclude SARS-Cov-2 infection and should not be used as the sole basis for treatment or other patient management decisions. A negative result may occur with  improper specimen collection/handling, submission of specimen other than nasopharyngeal swab, presence of viral mutation(s) within the areas targeted by this assay, and inadequate number of viral copies(<138 copies/mL). A negative result must be combined with clinical observations, patient history, and epidemiological information. The expected result is Negative.  Fact Sheet for Patients:  BloggerCourse.com  Fact Sheet for Healthcare Providers:  SeriousBroker.it  This test is no t yet approved or cleared by the [place]  FDA and  has been authorized for detection and/or diagnosis of SARS-CoV-2 by FDA under an Emergency Use Authorization (EUA). This EUA will remain  in effect (meaning this test can be used) for the duration of the COVID-19 declaration under Section 564(b)(1) of the Act, 21 U.S.C.section  360bbb-3(b)(1), unless the authorization is terminated  or revoked sooner.       Influenza A by PCR NEGATIVE NEGATIVE Final   Influenza B by PCR NEGATIVE NEGATIVE Final    Comment: (NOTE) The Xpert Xpress SARS-CoV-2/FLU/RSV plus assay is intended as an aid in the diagnosis of influenza from Nasopharyngeal swab specimens and should not be used as a sole basis for treatment. Nasal washings and aspirates are unacceptable for Xpert Xpress SARS-CoV-2/FLU/RSV testing.  Fact Sheet for Patients: BloggerCourse.com  Fact Sheet for Healthcare Providers: SeriousBroker.it  This test is not yet approved or cleared by the [place]  FDA and has been authorized for detection and/or diagnosis of SARS-CoV-2 by FDA under an Emergency Use Authorization (EUA). This EUA will remain in effect (meaning this test can be used) for the duration of the COVID-19 declaration under Section 564(b)(1) of the Act, 21 U.S.C. section 360bbb-3(b)(1), unless the authorization is terminated or revoked.     Resp Syncytial Virus by PCR NEGATIVE NEGATIVE Final    Comment: (NOTE) Fact Sheet for Patients: BloggerCourse.com  Fact Sheet for Healthcare Providers: SeriousBroker.it  This test is not yet approved or cleared by the [place]  FDA and has been authorized for detection and/or diagnosis of SARS-CoV-2 by FDA under an Emergency Use Authorization (EUA). This EUA will remain in effect (meaning this test can be used) for the duration of the COVID-19 declaration under Section 564(b)(1) of the Act, 21 U.S.C. section 360bbb-3(b)(1), unless the authorization is terminated or revoked.  Performed at [facility], [address] [place], [place] [address]   Blood Culture ID Panel (Reflexed)     Status: Abnormal   Collection Time: 10/13/23 11:28 PM  Result Value Ref Range Status   Enterococcus faecalis  NOT DETECTED NOT DETECTED Final   Enterococcus Faecium NOT DETECTED NOT DETECTED Final   Listeria monocytogenes NOT DETECTED  NOT DETECTED Final   Staphylococcus species NOT DETECTED NOT DETECTED Final   Staphylococcus aureus (BCID) NOT DETECTED NOT DETECTED Final   Staphylococcus epidermidis NOT DETECTED NOT DETECTED Final   Staphylococcus lugdunensis NOT DETECTED NOT DETECTED Final   Streptococcus species NOT DETECTED NOT DETECTED Final   Streptococcus agalactiae NOT DETECTED NOT DETECTED Final   Streptococcus pneumoniae NOT DETECTED NOT DETECTED Final   Streptococcus pyogenes NOT DETECTED NOT DETECTED Final   A.calcoaceticus-baumannii NOT DETECTED NOT DETECTED Final   Bacteroides fragilis NOT DETECTED NOT DETECTED Final   Enterobacterales DETECTED (A) NOT DETECTED Final    Comment: Enterobacterales represent a large order of gram negative bacteria, not a single organism. CRITICAL RESULT CALLED TO, READ BACK BY AND VERIFIED WITH: ERIN [facility] PHARMD 1142 10/14/23 HNM    Enterobacter cloacae complex NOT DETECTED NOT DETECTED Final   Escherichia coli DETECTED (A) NOT DETECTED Final    Comment: CRITICAL RESULT CALLED TO, READ BACK BY AND VERIFIED WITH: ERIN [facility] PHARMD 1142 10/14/23 HNM    Klebsiella aerogenes NOT DETECTED NOT DETECTED Final   Klebsiella oxytoca NOT DETECTED NOT DETECTED Final   Klebsiella pneumoniae NOT DETECTED NOT DETECTED Final   Proteus species NOT DETECTED NOT DETECTED Final   Salmonella species NOT DETECTED NOT DETECTED Final   Serratia marcescens NOT DETECTED NOT DETECTED Final   Haemophilus influenzae NOT DETECTED NOT DETECTED Final   Neisseria meningitidis NOT DETECTED NOT DETECTED Final   Pseudomonas aeruginosa NOT DETECTED NOT DETECTED Final   Stenotrophomonas maltophilia NOT DETECTED NOT DETECTED Final   Candida albicans NOT DETECTED NOT DETECTED Final   Candida auris NOT DETECTED NOT DETECTED Final   Candida glabrata NOT DETECTED NOT DETECTED Final    Candida krusei NOT DETECTED NOT DETECTED Final   Candida parapsilosis NOT DETECTED NOT DETECTED Final   Candida tropicalis NOT DETECTED NOT DETECTED Final   Cryptococcus neoformans/gattii NOT DETECTED NOT DETECTED Final   CTX-M ESBL NOT DETECTED NOT DETECTED Final   Carbapenem resistance IMP NOT DETECTED NOT DETECTED Final   Carbapenem resistance KPC NOT DETECTED NOT DETECTED Final   Carbapenem resistance NDM NOT DETECTED NOT DETECTED Final   Carbapenem resist OXA 48 LIKE NOT DETECTED NOT DETECTED Final   Carbapenem resistance VIM NOT DETECTED NOT DETECTED Final    Comment: Performed at [facility], [address], [place], [place] [address]  Urine Culture     Status: None   Collection Time: 10/14/23 12:09 AM   Specimen: Urine, Random  Result Value Ref Range Status   Specimen Description   Final    URINE, RANDOM Performed at [facility], [address], [place], [place] [address]    Special Requests   Final    NONE Reflexed from [phone] Performed at [facility], [address] [place], [place] [address]    Culture   Final    NO GROWTH Performed at [facility], [address], [place], [place] [address]    Report Status 10/15/2023 FINAL  Final  Culture, blood (Routine x 2)     Status: None (Preliminary result)   Collection Time: 10/14/23 12:12 AM   Specimen: BLOOD  Result Value Ref Range Status   Specimen Description BLOOD BLOOD RIGHT HAND  Final   Special Requests   Final    BOTTLES DRAWN AEROBIC AND ANAEROBIC Blood Culture results may not be optimal due to an excessive volume of blood received in culture bottles   Culture   Final  NO GROWTH 3 DAYS Performed at [facility], [address] [place], [place] [address]    Report Status PENDING  Incomplete     Labs: BNP (last 3 results) No results for input(s): BNP in the last 8760 hours. Basic Metabolic Panel: Recent Labs  Lab 10/13/23 2328 10/14/23 0823  10/15/23 0413 10/17/23 0844  NA 135 138 137 140  K 3.5 3.5 3.3* 3.8  CL 97* 100 102 107  CO2 24 26 23 27   GLUCOSE 186* 178* 168* 122*  BUN 9 8 7* 7*  CREATININE 0.96 1.04 0.76 0.68  CALCIUM  9.3 8.6* 8.8* 8.8*   Liver Function Tests: Recent Labs  Lab 10/13/23 2328 10/14/23 0823 10/15/23 0413 10/17/23 0844  AST 50* 49* 50* 65*  ALT 37 35 31 33  ALKPHOS 79 69 68 57  BILITOT 1.1 1.0 1.0 0.8  PROT 7.1 5.9* 6.0* 5.8*  ALBUMIN 3.9 3.2* 3.1* 2.9*   No results for input(s): LIPASE, AMYLASE in the last 168 hours. No results for input(s): AMMONIA in the last 168 hours. CBC: Recent Labs  Lab 10/13/23 2328 10/14/23 0823 10/15/23 0413 10/17/23 0844  WBC 11.6* 14.4* 8.9 6.2  NEUTROABS 10.0*  --   --  4.4  HGB 12.9* 12.0* 12.2* 11.8*  HCT 37.2* 35.8* 36.6* 35.2*  MCV 98.9 100.6* 100.3* 98.9  PLT 203 162 172 185   Cardiac Enzymes: No results for input(s): CKTOTAL, CKMB, CKMBINDEX, TROPONINI in the last 168 hours. BNP: Invalid input(s): POCBNP CBG: No results for input(s): GLUCAP in the last 168 hours. D-Dimer No results for input(s): DDIMER in the last 72 hours. Hgb A1c No results for input(s): HGBA1C in the last 72 hours. Lipid Profile No results for input(s): CHOL, HDL, LDLCALC, TRIG, CHOLHDL, LDLDIRECT in the last 72 hours. Thyroid function studies No results for input(s): TSH, T4TOTAL, T3FREE, THYROIDAB in the last 72 hours.  Invalid input(s): FREET3 Anemia work up No results for input(s): VITAMINB12, FOLATE, FERRITIN, TIBC, IRON, RETICCTPCT in the last 72 hours. Urinalysis    Component Value Date/Time   COLORURINE STRAW (A) 10/14/2023 0009   APPEARANCEUR CLEAR (A) 10/14/2023 0009   APPEARANCEUR Clear 03/14/2021 1017   LABSPEC 1.009 10/14/2023 0009   PHURINE 7.0 10/14/2023 0009   GLUCOSEU NEGATIVE 10/14/2023 0009   HGBUR MODERATE (A) 10/14/2023 0009   BILIRUBINUR NEGATIVE 10/14/2023 0009   BILIRUBINUR  Negative 03/14/2021 1017   KETONESUR NEGATIVE 10/14/2023 0009   PROTEINUR NEGATIVE 10/14/2023 0009   NITRITE NEGATIVE 10/14/2023 0009   LEUKOCYTESUR MODERATE (A) 10/14/2023 0009   Sepsis Labs Recent Labs  Lab 10/13/23 2328 10/14/23 0823 10/15/23 0413 10/17/23 0844  WBC 11.6* 14.4* 8.9 6.2   Microbiology Recent Results (from the past 240 hours)  Culture, blood (Routine x 2)     Status: Abnormal   Collection Time: 10/13/23 11:28 PM   Specimen: BLOOD LEFT ARM  Result Value Ref Range Status   Specimen Description   Final    BLOOD LEFT ARM Performed at [facility], [address] [place], [place] [address]    Special Requests   Final    BOTTLES DRAWN AEROBIC AND ANAEROBIC Blood Culture adequate volume Performed at [facility], [address] [place], [place] [address]    Culture  Setup Time   Final    GRAM NEGATIVE RODS IN BOTH AEROBIC AND ANAEROBIC BOTTLES CRITICAL RESULT CALLED TO, READ BACK BY AND VERIFIED WITH: ERIN [facility] PHARMD 1142 10/14/23 HNM GRAM STAIN REVIEWED-AGREE WITH RESULT  DRT Performed at [facility], [address] [place], [place] [address]    Culture ESCHERICHIA COLI (A)  Final   Report Status 10/16/2023 FINAL  Final   Organism ID, Bacteria ESCHERICHIA COLI  Final   Organism ID, Bacteria ESCHERICHIA COLI  Final      Susceptibility   Escherichia coli - KIRBY BAUER*    CEFAZOLIN  INTERMEDIATE Intermediate    Escherichia coli - MIC*    AMPICILLIN >=32 RESISTANT Resistant     CEFEPIME  <=0.12 SENSITIVE Sensitive     CEFTAZIDIME <=1 SENSITIVE Sensitive     CEFTRIAXONE  <=0.25 SENSITIVE Sensitive     CIPROFLOXACIN  <=0.25 SENSITIVE Sensitive     GENTAMICIN <=1 SENSITIVE Sensitive     IMIPENEM 1 SENSITIVE Sensitive     TRIMETH/SULFA <=20 SENSITIVE Sensitive     AMPICILLIN/SULBACTAM >=32 RESISTANT Resistant     PIP/TAZO <=4 SENSITIVE Sensitive ug/mL    * ESCHERICHIA COLI    ESCHERICHIA COLI  Resp panel by RT-PCR (RSV, Flu A&B,  Covid) Anterior Nasal Swab     Status: None   Collection Time: 10/13/23 11:28 PM   Specimen: Anterior Nasal Swab  Result Value Ref Range Status   SARS Coronavirus 2 by RT PCR NEGATIVE NEGATIVE Final    Comment: (NOTE) SARS-CoV-2 target nucleic acids are NOT DETECTED.  The SARS-CoV-2 RNA is generally detectable in upper respiratory specimens during the acute phase of infection. The lowest concentration of SARS-CoV-2 viral copies this assay can detect is 138 copies/mL. A negative result does not preclude SARS-Cov-2 infection and should not be used as the sole basis for treatment or other patient management decisions. A negative result may occur with  improper specimen collection/handling, submission of specimen other than nasopharyngeal swab, presence of viral mutation(s) within the areas targeted by this assay, and inadequate number of viral copies(<138 copies/mL). A negative result must be combined with clinical observations, patient history, and epidemiological information. The expected result is Negative.  Fact Sheet for Patients:  BloggerCourse.com  Fact Sheet for Healthcare Providers:  SeriousBroker.it  This test is no t yet approved or cleared by the [place]  FDA and  has been authorized for detection and/or diagnosis of SARS-CoV-2 by FDA under an Emergency Use Authorization (EUA). This EUA will remain  in effect (meaning this test can be used) for the duration of the COVID-19 declaration under Section 564(b)(1) of the Act, 21 U.S.C.section 360bbb-3(b)(1), unless the authorization is terminated  or revoked sooner.       Influenza A by PCR NEGATIVE NEGATIVE Final   Influenza B by PCR NEGATIVE NEGATIVE Final    Comment: (NOTE) The Xpert Xpress SARS-CoV-2/FLU/RSV plus assay is intended as an aid in the diagnosis of influenza from Nasopharyngeal swab specimens and should not be used as a sole basis for treatment. Nasal  washings and aspirates are unacceptable for Xpert Xpress SARS-CoV-2/FLU/RSV testing.  Fact Sheet for Patients: BloggerCourse.com  Fact Sheet for Healthcare Providers: SeriousBroker.it  This test is not yet approved or cleared by the [place]  FDA and has been authorized for detection and/or diagnosis of SARS-CoV-2 by FDA under an Emergency Use Authorization (EUA). This EUA will remain in effect (meaning this test can be used) for the duration of the COVID-19 declaration under Section 564(b)(1) of the Act, 21 U.S.C. section 360bbb-3(b)(1), unless the authorization is terminated or revoked.     Resp Syncytial Virus by PCR NEGATIVE NEGATIVE Final    Comment: (NOTE) Fact Sheet for Patients: BloggerCourse.com  Fact Sheet for  Healthcare Providers: SeriousBroker.it  This test is not yet approved or cleared by the [place]  FDA and has been authorized for detection and/or diagnosis of SARS-CoV-2 by FDA under an Emergency Use Authorization (EUA). This EUA will remain in effect (meaning this test can be used) for the duration of the COVID-19 declaration under Section 564(b)(1) of the Act, 21 U.S.C. section 360bbb-3(b)(1), unless the authorization is terminated or revoked.  Performed at [facility], [address] [place], [place] [address]   Blood Culture ID Panel (Reflexed)     Status: Abnormal   Collection Time: 10/13/23 11:28 PM  Result Value Ref Range Status   Enterococcus faecalis NOT DETECTED NOT DETECTED Final   Enterococcus Faecium NOT DETECTED NOT DETECTED Final   Listeria monocytogenes NOT DETECTED NOT DETECTED Final   Staphylococcus species NOT DETECTED NOT DETECTED Final   Staphylococcus aureus (BCID) NOT DETECTED NOT DETECTED Final   Staphylococcus epidermidis NOT DETECTED NOT DETECTED Final   Staphylococcus lugdunensis NOT DETECTED NOT DETECTED Final    Streptococcus species NOT DETECTED NOT DETECTED Final   Streptococcus agalactiae NOT DETECTED NOT DETECTED Final   Streptococcus pneumoniae NOT DETECTED NOT DETECTED Final   Streptococcus pyogenes NOT DETECTED NOT DETECTED Final   A.calcoaceticus-baumannii NOT DETECTED NOT DETECTED Final   Bacteroides fragilis NOT DETECTED NOT DETECTED Final   Enterobacterales DETECTED (A) NOT DETECTED Final    Comment: Enterobacterales represent a large order of gram negative bacteria, not a single organism. CRITICAL RESULT CALLED TO, READ BACK BY AND VERIFIED WITH: ERIN [facility] PHARMD 1142 10/14/23 HNM    Enterobacter cloacae complex NOT DETECTED NOT DETECTED Final   Escherichia coli DETECTED (A) NOT DETECTED Final    Comment: CRITICAL RESULT CALLED TO, READ BACK BY AND VERIFIED WITH: ERIN [facility] PHARMD 1142 10/14/23 HNM    Klebsiella aerogenes NOT DETECTED NOT DETECTED Final   Klebsiella oxytoca NOT DETECTED NOT DETECTED Final   Klebsiella pneumoniae NOT DETECTED NOT DETECTED Final   Proteus species NOT DETECTED NOT DETECTED Final   Salmonella species NOT DETECTED NOT DETECTED Final   Serratia marcescens NOT DETECTED NOT DETECTED Final   Haemophilus influenzae NOT DETECTED NOT DETECTED Final   Neisseria meningitidis NOT DETECTED NOT DETECTED Final   Pseudomonas aeruginosa NOT DETECTED NOT DETECTED Final   Stenotrophomonas maltophilia NOT DETECTED NOT DETECTED Final   Candida albicans NOT DETECTED NOT DETECTED Final   Candida auris NOT DETECTED NOT DETECTED Final   Candida glabrata NOT DETECTED NOT DETECTED Final   Candida krusei NOT DETECTED NOT DETECTED Final   Candida parapsilosis NOT DETECTED NOT DETECTED Final   Candida tropicalis NOT DETECTED NOT DETECTED Final   Cryptococcus neoformans/gattii NOT DETECTED NOT DETECTED Final   CTX-M ESBL NOT DETECTED NOT DETECTED Final   Carbapenem resistance IMP NOT DETECTED NOT DETECTED Final   Carbapenem resistance KPC NOT DETECTED NOT DETECTED Final    Carbapenem resistance NDM NOT DETECTED NOT DETECTED Final   Carbapenem resist OXA 48 LIKE NOT DETECTED NOT DETECTED Final   Carbapenem resistance VIM NOT DETECTED NOT DETECTED Final    Comment: Performed at [facility], [address] [place], [place] [address]  Urine Culture     Status: None   Collection Time: 10/14/23 12:09 AM   Specimen: Urine, Random  Result Value Ref Range Status   Specimen Description   Final    URINE, RANDOM Performed at [facility], [address] [place], [place] [address]    Special Requests   Final  NONE Reflexed from [phone] Performed at [facility], [address] [place], [place] [address]    Culture   Final    NO GROWTH Performed at [facility], [address] [place], [place] [address]    Report Status 10/15/2023 FINAL  Final  Culture, blood (Routine x 2)     Status: None (Preliminary result)   Collection Time: 10/14/23 12:12 AM   Specimen: BLOOD  Result Value Ref Range Status   Specimen Description BLOOD BLOOD RIGHT HAND  Final   Special Requests   Final    BOTTLES DRAWN AEROBIC AND ANAEROBIC Blood Culture results may not be optimal due to an excessive volume of blood received in culture bottles   Culture   Final    NO GROWTH 3 DAYS Performed at [facility], [address], [place], [place] [address]    Report Status PENDING  Incomplete     Time coordinating discharge: 32 min   SIGNED: Lorane Poland, MD  [facility] 10/17/2023, 12:37 PM Pager   If 7PM-7AM, please contact night-coverage

## 2023-10-17 NOTE — Plan of Care (Signed)

## 2023-10-17 NOTE — Plan of Care (Signed)
  Problem: Fluid Volume: Goal: Hemodynamic stability will improve Outcome: Progressing   Problem: Respiratory: Goal: Ability to maintain adequate ventilation will improve Outcome: Progressing   Problem: Education: Goal: Knowledge of General Education information will improve Description: Including pain rating scale, medication(s)/side effects and non-pharmacologic comfort measures Outcome: Progressing   Problem: Health Behavior/Discharge Planning: Goal: Ability to manage health-related needs will improve Outcome: Progressing   Problem: Activity: Goal: Risk for activity intolerance will decrease Outcome: Progressing   Problem: Nutrition: Goal: Adequate nutrition will be maintained Outcome: Progressing   Problem: Coping: Goal: Level of anxiety will decrease Outcome: Progressing   Problem: Elimination: Goal: Will not experience complications related to bowel motility Outcome: Progressing   Problem: Pain Managment: Goal: General experience of comfort will improve and/or be controlled Outcome: Progressing   Problem: Safety: Goal: Ability to remain free from injury will improve Outcome: Progressing   Problem: Skin Integrity: Goal: Risk for impaired skin integrity will decrease Outcome: Progressing

## 2023-10-19 LAB — CULTURE, BLOOD (ROUTINE X 2): Culture: NO GROWTH

## 2023-10-20 ENCOUNTER — Encounter: Payer: Self-pay | Admitting: Dermatology

## 2023-10-20 ENCOUNTER — Ambulatory Visit (INDEPENDENT_AMBULATORY_CARE_PROVIDER_SITE_OTHER): Admitting: Dermatology

## 2023-10-20 DIAGNOSIS — L299 Pruritus, unspecified: Secondary | ICD-10-CM | POA: Diagnosis not present

## 2023-10-20 DIAGNOSIS — L820 Inflamed seborrheic keratosis: Secondary | ICD-10-CM

## 2023-10-20 DIAGNOSIS — L821 Other seborrheic keratosis: Secondary | ICD-10-CM

## 2023-10-20 DIAGNOSIS — L853 Xerosis cutis: Secondary | ICD-10-CM | POA: Diagnosis not present

## 2023-10-20 DIAGNOSIS — D692 Other nonthrombocytopenic purpura: Secondary | ICD-10-CM | POA: Diagnosis not present

## 2023-10-20 NOTE — Patient Instructions (Addendum)
 Cryotherapy Aftercare  Wash gently with soap and water everyday.   Apply Vaseline and Band-Aid daily until healed.   Gentle Skin Care Guide  1. Bathe no more than once a day.  2. Avoid bathing in hot water  3. Use a mild soap like Dove, Vanicream, Cetaphil, CeraVe. Can use Lever 2000 or Cetaphil antibacterial soap  4. Use soap only where you need it. On most days, use it under your arms, between your legs, and on your feet. Let the water rinse other areas unless visibly dirty.  5. When you get out of the bath/shower, use a towel to gently blot your skin dry, don't rub it.  6. While your skin is still a little damp, apply a moisturizing cream such as Vanicream, CeraVe, Cetaphil, Eucerin, Sarna lotion or plain Vaseline Jelly. For hands apply Neutrogena [place] Hand Cream or Excipial Hand Cream.  7. Reapply moisturizer any time you start to itch or feel dry.  8. Sometimes using free and clear laundry detergents can be helpful. Fabric softener sheets should be avoided. Downy Free & Gentle liquid, or any liquid fabric softener that is free of dyes and perfumes, it acceptable to use  9. If your doctor has given you prescription creams you may apply moisturizers over them     Seborrheic Keratosis  What causes seborrheic keratoses? Seborrheic keratoses are harmless, common skin growths that first appear during adult life.  As time goes by, more growths appear.  Some people may develop a large number of them.  Seborrheic keratoses appear on both covered and uncovered body parts.  They are not caused by sunlight.  The tendency to develop seborrheic keratoses can be inherited.  They vary in color from skin-colored to gray, brown, or even black.  They can be either smooth or have a rough, warty surface.   Seborrheic keratoses are superficial and look as if they were stuck on the skin.  Under the microscope this type of keratosis looks like layers upon layers of skin.  That is why at times the  top layer may seem to fall off, but the rest of the growth remains and re-grows.    Treatment Seborrheic keratoses do not need to be treated, but can easily be removed in the office.  Seborrheic keratoses often cause symptoms when they rub on clothing or jewelry.  Lesions can be in the way of shaving.  If they become inflamed, they can cause itching, soreness, or burning.  Removal of a seborrheic keratosis can be accomplished by freezing, burning, or surgery. If any spot bleeds, scabs, or grows rapidly, please return to have it checked, as these can be an indication of a skin cancer.   Due to recent changes in healthcare laws, you may see results of your pathology and/or laboratory studies on MyChart before the doctors have had a chance to review them. We understand that in some cases there may be results that are confusing or concerning to you. Please understand that not all results are received at the same time and often the doctors may need to interpret multiple results in order to provide you with the best plan of care or course of treatment. Therefore, we ask that you please give us  2 business days to thoroughly review all your results before contacting the office for clarification. Should we see a critical lab result, you will be contacted sooner.   If You Need Anything After Your Visit  If you have any questions or concerns for your  doctor, please call our main line at [phone] and press option 4 to reach your doctor's medical assistant. If no one answers, please leave a voicemail as directed and we will return your call as soon as possible. Messages left after 4 pm will be answered the following business day.   You may also send us  a message via MyChart. We typically respond to MyChart messages within 1-2 business days.  For prescription refills, please ask your pharmacy to contact our office. Our fax number is [phone].  If you have an urgent issue when the clinic is closed that  cannot wait until the next business day, you can page your doctor at the number below.    Please note that while we do our best to be available for urgent issues outside of office hours, we are not available 24/7.   If you have an urgent issue and are unable to reach us , you may choose to seek medical care at your doctor's office, retail clinic, urgent care center, or emergency room.  If you have a medical emergency, please immediately call 911 or go to the emergency department.  Pager Numbers  - Dr. Bary Likes: [phone]  - Dr. Annette Barters: [phone]  - Dr. Felipe Horton: [phone]   In the event of inclement weather, please call our main line at [phone] for an update on the status of any delays or closures.  Dermatology Medication Tips: Please keep the boxes that topical medications come in in order to help keep track of the instructions about where and how to use these. Pharmacies typically print the medication instructions only on the boxes and not directly on the medication tubes.   If your medication is too expensive, please contact our office at [phone] option 4 or send us  a message through MyChart.   We are unable to tell what your co-pay for medications will be in advance as this is different depending on your insurance coverage. However, we may be able to find a substitute medication at lower cost or fill out paperwork to get insurance to cover a needed medication.   If a prior authorization is required to get your medication covered by your insurance company, please allow us  1-2 business days to complete this process.  Drug prices often vary depending on where the prescription is filled and some pharmacies may offer cheaper prices.  The website www.goodrx.com contains coupons for medications through different pharmacies. The prices here do not account for what the cost may be with help from insurance (it may be cheaper with your insurance), but the website can give you  the price if you did not use any insurance.  - You can print the associated coupon and take it with your prescription to the pharmacy.  - You may also stop by our office during regular business hours and pick up a GoodRx coupon card.  - If you need your prescription sent electronically to a different pharmacy, notify our office through [facility] or by phone at [phone] option 4.     Si Usted Necesita Algo Despus de Su Visita  Tambin puede enviarnos un mensaje a travs de Clinical cytogeneticist. Por lo general respondemos a los mensajes de MyChart en el transcurso de 1 a 2 das hbiles.  Para renovar recetas, por favor pida a su farmacia que se ponga en contacto con nuestra oficina. Franz Jacks de fax es [place] [phone].  Si tiene un asunto urgente cuando la clnica est cerrada y que no puede esperar Freight forwarder  siguiente da hbil, puede llamar/localizar a su doctor(a) al nmero que aparece a continuacin.   Por favor, tenga en cuenta que aunque hacemos todo lo posible para estar disponibles para asuntos urgentes fuera del horario de [place], no estamos disponibles las 24 horas del da, los 7 [address]  [address] de la [place].   Si tiene un problema urgente y no puede comunicarse con nosotros, puede optar por buscar atencin mdica  en el consultorio de su doctor(a), en una clnica privada, en un centro de atencin urgente o en una sala de emergencias.  Si tiene Engineer, drilling, por favor llame inmediatamente al 911 o vaya a la sala de emergencias.  Nmeros de bper  - Dr. Bary Likes: [phone]  - Dra. Annette Barters: [phone]  - Dr. Felipe Horton: [phone]   En caso de inclemencias del tiempo, por favor llame a Lajuan Pila principal al [phone] para una actualizacin sobre el [place] de cualquier retraso o cierre.  Consejos para la medicacin en dermatologa: Por favor, guarde las cajas en las que vienen los medicamentos de uso tpico para ayudarle a seguir las instrucciones sobre dnde y  cmo usarlos. Las farmacias generalmente imprimen las instrucciones del medicamento slo en las cajas y no directamente en los tubos del [place].   Si su medicamento es muy caro, por favor, pngase en contacto con Bettyjane Brunet llamando al [phone] y presione la opcin 4 o envenos un mensaje a travs de Clinical cytogeneticist.   No podemos decirle cul ser su copago por los medicamentos por adelantado ya que esto es diferente dependiendo de la cobertura de su seguro. Sin embargo, es posible que podamos encontrar un medicamento sustituto a Audiological scientist un formulario para que el seguro cubra el medicamento que se considera necesario.   Si se requiere una autorizacin previa para que su compaa de seguros [place] su medicamento, por favor permtanos de 1 a 2 das hbiles para completar este proceso.  Los precios de los medicamentos varan con frecuencia dependiendo del Environmental consultant de dnde se surte la receta y alguna farmacias pueden ofrecer precios ms baratos.  El sitio web www.goodrx.com tiene cupones para medicamentos de Health and safety inspector. Los precios aqu no tienen en cuenta lo que podra costar con la ayuda del seguro (puede ser ms barato con su seguro), pero el sitio web puede darle el precio si no utiliz Tourist information centre manager.  - Puede imprimir el cupn correspondiente y llevarlo con su receta a la farmacia.  - Tambin puede pasar por nuestra oficina durante el horario de atencin regular y Education officer, museum una tarjeta de cupones de GoodRx.  - Si necesita que su receta se enve electrnicamente a una farmacia diferente, informe a nuestra oficina a travs de MyChart de [place] o por telfono llamando al [phone] y presione la opcin 4.

## 2023-10-20 NOTE — Progress Notes (Signed)
   Follow-Up Visit   Subjective  PRABHAV FAULKENBERRY is a 73 y.o. male who presents for the following: ISK f/u Left Lower Leg x5, L knee x1, R forearm x7, upper back x17- cleared up The patient has spots, moles and lesions to be evaluated, some may be new or changing and the patient may have concern these could be cancer.  Multiple spots are itchy and irritated.   The following portions of the chart were reviewed this encounter and updated as appropriate: medications, allergies, medical history  Review of Systems:  No other skin or systemic complaints except as noted in HPI or Assessment and Plan.  Objective  Well appearing patient in no apparent distress; mood and affect are within normal limits.   A focused examination was performed of the following areas: Back, arms, face, scalp, chest, abdomen, L leg  Relevant exam findings are noted in the Assessment and Plan.  forehead x 4, R inferior vertex x 1, R upper arm x 1, R clavicle x 1, L upper arm x 2, L ant thigh x 1 (10) Stuck on waxy papules with erythema  Previously treated ISKs are clear  Assessment & Plan   Xerosis with PRURITUS arms - diffuse xerotic patches - recommend gentle, hydrating skin care - gentle skin care handout given Recommend Dove for sensitive skin, Cerave anit itch cream  Purpura - Chronic; persistent and recurrent.  Treatable, but not curable. arms - Violaceous macules and patches - Benign - Related to trauma, age, sun damage and/or use of blood thinners, chronic use of topical and/or oral steroids - Observe - Can use OTC arnica containing moisturizer such as Dermend Bruise Formula if desired - Call for worsening or other concerns  SEBORRHEIC KERATOSIS arms - Stuck-on, waxy, tan-brown papules and/or plaques  - Benign-appearing - Discussed benign etiology and prognosis. - Observe - Call for any changes     INFLAMED SEBORRHEIC KERATOSIS (10) forehead x 4, R inferior vertex x 1, R upper arm x 1, R  clavicle x 1, L upper arm x 2, L ant thigh x 1 (10) Symptomatic, irritating, patient would like treated. Destruction of lesion - forehead x 4, R inferior vertex x 1, R upper arm x 1, R clavicle x 1, L upper arm x 2, L ant thigh x 1 (10)  Destruction method: cryotherapy   Informed consent: discussed and consent obtained   Lesion destroyed using liquid nitrogen: Yes   Region frozen until ice ball extended beyond lesion: Yes   Outcome: patient tolerated procedure well with no complications   Post-procedure details: wound care instructions given   Additional details:  Prior to procedure, discussed risks of blister formation, small wound, skin dyspigmentation, or rare scar following cryotherapy. Recommend Vaseline ointment to treated areas while healing.   Return for 3-53m ISK f/u.  I, Grayce Saunas, RMA, am acting as scribe for Rexene Rattler, MD .   Documentation: I have reviewed the above documentation for accuracy and completeness, and I agree with the above.  Rexene Rattler, MD

## 2023-10-27 ENCOUNTER — Ambulatory Visit: Admitting: Physician Assistant

## 2023-10-28 ENCOUNTER — Ambulatory Visit: Admitting: Physician Assistant

## 2023-10-28 VITALS — BP 138/75 | HR 71 | Ht 68.0 in | Wt 200.4 lb

## 2023-10-28 DIAGNOSIS — R3912 Poor urinary stream: Secondary | ICD-10-CM | POA: Diagnosis not present

## 2023-10-28 DIAGNOSIS — N401 Enlarged prostate with lower urinary tract symptoms: Secondary | ICD-10-CM

## 2023-10-28 DIAGNOSIS — N200 Calculus of kidney: Secondary | ICD-10-CM

## 2023-10-28 DIAGNOSIS — N390 Urinary tract infection, site not specified: Secondary | ICD-10-CM

## 2023-10-28 LAB — URINALYSIS, COMPLETE
Bilirubin, UA: NEGATIVE
Glucose, UA: NEGATIVE
Ketones, UA: NEGATIVE
Leukocytes,UA: NEGATIVE
Nitrite, UA: NEGATIVE
Protein,UA: NEGATIVE
RBC, UA: NEGATIVE
Specific Gravity, UA: 1.015 (ref 1.005–1.030)
Urobilinogen, Ur: 0.2 mg/dL (ref 0.2–1.0)
pH, UA: 6 (ref 5.0–7.5)

## 2023-10-28 LAB — MICROSCOPIC EXAMINATION: Epithelial Cells (non renal): >10 /HPF — AB (ref 0–10)

## 2023-10-28 MED ORDER — SILODOSIN 8 MG PO CAPS: 8.0000 mg | ORAL_CAPSULE | Freq: Every day | ORAL | 3 refills | Status: DC

## 2023-10-28 NOTE — Progress Notes (Signed)
 10/28/2023 1:17 PM   Willie Clark [birth year] [id number]  CC: Chief Complaint  Patient presents with   Follow-up   HPI: Willie Clark is a 73 y.o. male with PMH CAD on daily Imdur  and nitroglycerin  as needed, BPH, balanitis, and nephrolithiasis recently admitted with sepsis due to E. coli UTI with pyelonephritis who presents today for outpatient follow-up.  He is accompanied today by his wife, who contributes to HPI  He completed culture appropriate antibiotics for his UTI (cefepime , ceftriaxone , ciprofloxacin ).  He reports 2 days of dysuria and gross hematuria leading up to his recent hospitalization.  He has a history of complicated UTI in the past, timing unclear.  He is asymptomatic of UTI today, though he admits to chronic spraying stream, weak stream, hesitancy, and postvoid dribbling.  Notably, he is prone to dizziness and lightheadedness.  CTAP with contrast dated 10/14/2023 was notable for bilateral, punctate nonobstructing renal stones  In-office UA today pan negative; urine microscopy with >10 epithelial cells/hpf.  PMH: Past Medical History:  Diagnosis Date   AAA (abdominal aortic aneurysm) (HCC) 02/23/2020   a.) AAA duplex 02/23/2020: measured 3.0 cm   Acute coronary syndrome (HCC)    Anxiety    Aortic atherosclerosis (HCC)    Arthritis    ASD (atrial septal defect) 01/20/2006   a.) noted on intraoperative TEE; s/p closure   Barrett esophagus    Barrett's esophagus    Bilateral leg edema    Bilateral occipital neuralgia    Bowel obstruction (HCC)    Carpal tunnel syndrome    CHF (congestive heart failure) (HCC)    Chronic chest pain    COPD (chronic obstructive pulmonary disease) (HCC)    Coronary artery disease    a.)CABG 93; Redo 07; b.)LHC 03/15: 100 mLAD, 60 pLCx, 100 OM1, 80 pRCA, 100 mRCA - med mgmt. c.)LHC 6/17: LAD 100ost,  LCx 60p, OM1 100, 100 oRCA, VG-OM2 min irreg, VG-RPDA 30ost, 20d - med mgmt; d.)LHC 8/21: 100 oLAD, 100 oOM1-OM1, 100 o-pRCA, 100  RPL2, 100 RPL3, 100 OM2; pat grafts - med mgmt; e.)LHC 3/22: 60-70 o-pVG-OM2, 80-99 VG-RCA (DES x3); f.)R/LHC 07/22: 75 (ost) & 60 (mid) VG-OM (DES x2)   Depression    Diverticulitis    Dyshidrosis    Dysphagia    Enlarged prostate without lower urinary tract symptoms (luts)    Erectile dysfunction    Fibromyalgia    Gastric ulcer    Gastritis    GERD (gastroesophageal reflux disease)    Headache    Hemorrhoids    HFrEF (heart failure with reduced ejection fraction) (HCC)    a.) 09/2016 Echo: EF 55-60%, no rwma, triv AI, mild MR, mildly dil LA. -> as of April 2020: EF 40 to 45%.  Septal dyssynergy/hypokinesis due to postop state but otherwise unable to assess wall motion Sharol due to poor study. b.) TTE 12/12/2019: EF 40-45%, LAE, mild AR, G1DD; c.) TTE 06/20/2020: EF 35-40%, sev inferolateral HK, mild AR, G1DD.   History of BPH    History of kidney stones    History of myocardial infarct at age less than 60 years    Hyperlipidemia    Hypertension    Hyponatremia    Idiopathic urticaria    Intermittent claudication (HCC)    Kidney stones    Left ventricular failure (HCC)    Lightheadedness    Liver disease    Long term current use of antithrombotics/antiplatelets    a.) daily DAPT therapy (ASA + clopidogrel )  Mass on back    Migraine without aura    Myocardial infarction [facility]) 1993   Nail dystrophy    Nephrolithiasis    Obstructive uropathy    Orthostatic hypotension    a. Improved after discontinuation of metoprolol .   OSA on CPAP    Peripheral neuropathy    Peripheral neuropathy    Peyronie disease    Postoperative atrial fibrillation/flutter (HCC) 01/20/2006   a.) POD2 following re-do CABG; Tx'd with IV amiodarone, which did not convert; EF 20% on POD3; DCCV (100 J x 1) POD4.   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   a.) 2v CABG: LIMA-LAD, SVG-D2   S/P cervical spinal  fusion    S/P RE-DO CABG x 2 01/20/2006   a.) 2v REDO CABG: reserse SVG-PDA, SVG-OM1   Sinus bradycardia    Skin lesions, generalized    Small bowel obstruction (HCC) 02/2009   ST elevation myocardial infarction (STEMI) of inferior wall (HCC) 06/2020   a.) LHC 06/2020 --> subtotally occluded SVG-RCA with long segment of proximal graft disease and 99% stenosis at the distal anastomosis --> successful PCI to SVG to RCA with overlapping Resolute Onyx DES x 3   Stroke [facility]) 1993   right side weakness no blood thinners   on Aspirin    Syncope and collapse    Synovial cyst of lumbar facet joint    TIA (transient ischemic attack) 08/13/2014   Urticaria     Surgical History: Past Surgical History:  Procedure Laterality Date   BACK SURGERY     BIOPSY  04/22/2023   Procedure: BIOPSY;  Surgeon: Maryruth Ole DASEN, MD;  Location: [facility] ENDOSCOPY;  Service: Endoscopy;;  Gastric and Colonic   bowel obstruction  01/2009   CARDIAC CATHETERIZATION  11/2006   patent grafts. Significant OM3 disease and occluded diagonals.   CARDIAC CATHETERIZATION  06/2010   patent grafts. significant ISR in proximal LCX but OM2 is bypassed and gives retrograde flow to OM3.    CARDIAC CATHETERIZATION  06/2013   [facility]: Patent grafts with 60% proximal in-stent restenosis in the left circumflex with FFR of 0.85   CARDIAC CATHETERIZATION Left 09/25/2015   Procedure: Left Heart Cath and Cors/Grafts Angiography;  Surgeon: Deatrice DELENA Cage, MD;  Location: [facility] INVASIVE CV LAB;  Service: Cardiovascular;  Laterality: Left;   CATARACT EXTRACTION     CERVICAL FUSION     CHOLECYSTECTOMY     COLON SURGERY  01/2009   BOWEL RESECTION DUE TO SMALL BOWEL OBSTRUCTION   COLONOSCOPY     COLONOSCOPY WITH PROPOFOL  N/A 10/28/2017   Procedure: COLONOSCOPY WITH PROPOFOL ;  Surgeon: Gaylyn Gladis PENNER, MD;  Location: [facility] ENDOSCOPY;  Service: Endoscopy;  Laterality: N/A;   COLONOSCOPY WITH PROPOFOL  N/A 07/30/2021   Procedure: COLONOSCOPY  WITH PROPOFOL ;  Surgeon: Maryruth Ole DASEN, MD;  Location: [facility] ENDOSCOPY;  Service: Endoscopy;  Laterality: N/A;   COLONOSCOPY WITH PROPOFOL  N/A 04/22/2023   Procedure: COLONOSCOPY WITH PROPOFOL ;  Surgeon: Maryruth Ole DASEN, MD;  Location: [facility] ENDOSCOPY;  Service: Endoscopy;  Laterality: N/A;   CORONARY ANGIOPLASTY  2006   CORONARY ARTERY BYPASS GRAFT  1993/01/2006   redo at [facility]. LIMA to LAD, SVG to OM2 and SVG to RPDA   CORONARY STENT INTERVENTION N/A 06/20/2020   Procedure: CORONARY STENT INTERVENTION;  Surgeon: Mady Bruckner, MD;  Location: [facility] INVASIVE CV LAB;  Service: Cardiovascular;  Laterality: N/A;  CORONARY STENT INTERVENTION N/A 11/15/2020   Procedure: CORONARY STENT INTERVENTION;  Surgeon: Mady Bruckner, MD;  Location: [facility] INVASIVE CV LAB;  Service: Cardiovascular;  Laterality: N/A;   CYSTOSCOPY WITH STENT PLACEMENT Bilateral 09/24/2015   Procedure: CYSTOSCOPY WITH BILATERAL RETROGRADES, BILATERAL STENT PLACEMENT;  Surgeon: Donnice Brooks, MD;  Location: [facility] ORS;  Service: Urology;  Laterality: Bilateral;   ESOPHAGOGASTRODUODENOSCOPY N/A 10/25/2014   Procedure: ESOPHAGOGASTRODUODENOSCOPY (EGD);  Surgeon: Gladis RAYMOND Mariner, MD;  Location: [facility] ENDOSCOPY;  Service: Endoscopy;  Laterality: N/A;   ESOPHAGOGASTRODUODENOSCOPY (EGD) WITH PROPOFOL  N/A 02/03/2017   Procedure: ESOPHAGOGASTRODUODENOSCOPY (EGD) WITH PROPOFOL ;  Surgeon: Mariner Gladis RAYMOND, MD;  Location: [facility] ENDOSCOPY;  Service: Endoscopy;  Laterality: N/A;   ESOPHAGOGASTRODUODENOSCOPY (EGD) WITH PROPOFOL  N/A 04/18/2017   Procedure: ESOPHAGOGASTRODUODENOSCOPY (EGD) WITH PROPOFOL ;  Surgeon: Mariner Gladis RAYMOND, MD;  Location: [facility] ENDOSCOPY;  Service: Endoscopy;  Laterality: N/A;   ESOPHAGOGASTRODUODENOSCOPY (EGD) WITH PROPOFOL  N/A 04/22/2023   Procedure: ESOPHAGOGASTRODUODENOSCOPY (EGD) WITH PROPOFOL ;  Surgeon: Maryruth Ole DASEN, MD;  Location: [facility] ENDOSCOPY;  Service: Endoscopy;  Laterality: N/A;   EYE  SURGERY Bilateral    Cataract Extraction with IOL   HERNIA REPAIR     INGUINAL HERNIA REPAIR Bilateral 01/23/2016   Procedure: HERNIA REPAIR INGUINAL ADULT BILATERAL;  Surgeon: Larinda Unknown Sharps, MD;  Location: [facility] ORS;  Service: General;  Laterality: Bilateral;   LEFT HEART CATH AND CORS/GRAFTS ANGIOGRAPHY N/A 12/13/2019   Procedure: LEFT HEART CATH AND CORS/GRAFTS ANGIOGRAPHY;  Surgeon: Darron Deatrice LABOR, MD;  Location: [facility] INVASIVE CV LAB;  Service: Cardiovascular;  Laterality: N/A;   LEFT HEART CATH AND CORS/GRAFTS ANGIOGRAPHY N/A 06/20/2020   Procedure: LEFT HEART CATH AND CORS/GRAFTS ANGIOGRAPHY;  Surgeon: Mady Bruckner, MD;  Location: [facility] INVASIVE CV LAB;  Service: Cardiovascular;  Laterality: N/A;   LUMBAR LAMINECTOMY/DECOMPRESSION MICRODISCECTOMY Left 10/01/2016   Procedure: Left Lumbar four-five Laminotomy for resection of synovial cyst;  Surgeon: Unice Pac, MD;  Location: [facility] OR;  Service: Neurosurgery;  Laterality: Left;  left   NECK SURGERY  06/2009   POLYPECTOMY  04/22/2023   Procedure: POLYPECTOMY;  Surgeon: Maryruth Ole DASEN, MD;  Location: [facility] ENDOSCOPY;  Service: Endoscopy;;  Colon   RIGHT/LEFT HEART CATH AND CORONARY/GRAFT ANGIOGRAPHY N/A 11/15/2020   Procedure: RIGHT/LEFT HEART CATH AND CORONARY/GRAFT ANGIOGRAPHY;  Surgeon: Mady Bruckner, MD;  Location: [facility] INVASIVE CV LAB;  Service: Cardiovascular;  Laterality: N/A;   SHOULDER SURGERY Bilateral 2010    Home Medications:  Allergies as of 10/28/2023       Reactions   Amitriptyline Other (See Comments)   Pt unsure of what happens Other reaction(s): Other (See Comments) Pt unsure of what happens Pt unsure of what happens   Amoxicillin -pot Clavulanate Itching   Cyclobenzaprine Hives, Other (See Comments)   Pt unsure what hapens   Dicyclomine  Shortness Of Breath, Swelling   Sacubitril-valsartan Anaphylaxis, Itching   Icosapent  Ethyl (epa Ethyl Ester) (fish) Other (See Comments)   tongue felt slighlty  swollen/different after starting   Amitriptyline Hcl    [employer] Agent (non-screening) Diarrhea   Hydrocodone     Fish Oil Itching   Lidocaine  Nausea And Vomiting, Other (See Comments), Nausea Only   Nasal Spray (only time had reaction) Other reaction(s): Dizziness Nasal Spray (only time had reaction)   Omega-3 Fatty Acids-vitamin E Itching   Other Diarrhea, Itching   Reactive agents: Onions, bacon, steak   Oxycodone -acetaminophen  Other (See Comments)   Lowers BP Other reaction(s): Other (See Comments) Lowers BP Lowers BP   Sulfa Antibiotics Itching  Medication List        Accurate as of October 28, 2023  1:17 PM. If you have any questions, ask your nurse or doctor.          PAUSE taking these medications    isosorbide  mononitrate 60 MG 24 hr tablet Wait to take this until your doctor or other care provider tells you to start again. Commonly known as: IMDUR  Take 1 tablet (60 mg total) by mouth daily.   potassium chloride  SA 20 MEQ tablet Wait to take this until your doctor or other care provider tells you to start again. Commonly known as: KLOR-CON  M Take 1 tablet (20 mEq total) by mouth daily.   spironolactone  25 MG tablet Wait to take this until your doctor or other care provider tells you to start again. Commonly known as: ALDACTONE  Take 1 tablet (25 mg total) by mouth daily.   torsemide  20 MG tablet Wait to take this until your doctor or other care provider tells you to start again. Commonly known as: DEMADEX  Take 2 tablets (40 mg total) by mouth daily.       TAKE these medications    acetaminophen  500 MG tablet Commonly known as: TYLENOL  Take 1,000 mg by mouth every 6 (six) hours as needed for mild pain.   albuterol  108 (90 Base) MCG/ACT inhaler Commonly known as: VENTOLIN  HFA Inhale 1 puff into the lungs as needed.   aspirin  EC 81 MG tablet Take 1 tablet (81 mg total) by mouth daily. Swallow whole.   benzonatate 100 MG  capsule Commonly known as: TESSALON Take 100 mg by mouth 3 (three) times daily as needed for cough.   carvedilol  3.125 MG tablet Commonly known as: COREG  Take 1 tablet (3.125 mg total) by mouth 2 (two) times daily with a meal.   citalopram  20 MG tablet Commonly known as: CELEXA  Take 20 mg by mouth daily.   clobetasol  cream 0.05 % Commonly known as: TEMOVATE  APPLY TO ITCHY AREAS ON ELBOWS TWICE A DAY UNTIL IMPROVED. AVOID FACE, GROIN, UNDERARMS. What changed: See the new instructions.   clopidogrel  75 MG tablet Commonly known as: PLAVIX  TAKE 1 TABLET EVERY DAY   cyanocobalamin  1000 MCG tablet Commonly known as: VITAMIN B12 Take 1,000 mcg by mouth daily.   DAILY FIBER PO Take 1 tablet by mouth daily.   EPINEPHrine  0.3 mg/0.3 mL Soaj injection Commonly known as: EPI-PEN Inject 0.3 mg into the muscle as needed.   fexofenadine 180 MG tablet Commonly known as: ALLEGRA Take 180 mg by mouth daily.   gabapentin  400 MG capsule Commonly known as: NEURONTIN  Take 400-800 mg by mouth 3 (three) times daily.   HAIR SKIN AND NAILS FORMULA PO Take 1 capsule by mouth daily.   nitroGLYCERIN  0.4 MG SL tablet Commonly known as: NITROSTAT  Place 1 tablet (0.4 mg total) under the tongue every 5 (five) minutes as needed for chest pain.   pantoprazole  40 MG tablet Commonly known as: PROTONIX  Take 40 mg by mouth 2 (two) times daily.   PROBIOTIC PO Take by mouth daily at 8 pm.   rOPINIRole  1 MG tablet Commonly known as: REQUIP  Take 1 mg by mouth 2 (two) times daily.   rosuvastatin  40 MG tablet Commonly known as: CRESTOR  Take 1 tablet (40 mg total) by mouth daily.   traMADol  50 MG tablet Commonly known as: ULTRAM  Take 1 tablet (50 mg total) by mouth every 6 (six) hours as needed for up to 15 doses for severe pain or moderate  pain.        Allergies:  Allergies  Allergen Reactions   Amitriptyline Other (See Comments)    Pt unsure of what happens Other reaction(s): Other  (See Comments) Pt unsure of what happens Pt unsure of what happens   Amoxicillin -Pot Clavulanate Itching   Cyclobenzaprine Hives and Other (See Comments)    Pt unsure what hapens   Dicyclomine  Shortness Of Breath and Swelling   Sacubitril-Valsartan Anaphylaxis and Itching   Icosapent  Ethyl (Epa Ethyl Ester) (Fish) Other (See Comments)    tongue felt slighlty swollen/different after starting   Amitriptyline Hcl    [employer] Agent (Non-Screening) Diarrhea   Hydrocodone     Fish Oil Itching   Lidocaine  Nausea And Vomiting, Other (See Comments) and Nausea Only    Nasal Spray (only time had reaction) Other reaction(s): Dizziness Nasal Spray (only time had reaction)   Omega-3 Fatty Acids-Vitamin E Itching   Other Diarrhea and Itching    Reactive agents: Onions, bacon, steak   Oxycodone -Acetaminophen  Other (See Comments)    Lowers BP Other reaction(s): Other (See Comments) Lowers BP Lowers BP   Sulfa Antibiotics Itching    Family History: Family History  Problem Relation Age of Onset   Heart disease Father 67   Heart attack Father    Breast cancer Paternal Aunt     Social History:   reports that he quit smoking about 32 years ago. His smoking use included cigarettes. He has never used smokeless tobacco. He reports that he does not currently use alcohol . He reports that he does not use drugs.  Physical Exam: BP 138/75   Clark 71   Ht 5' 8 (1.727 m)   Wt 200 lb 6.4 oz (90.9 kg)   BMI 30.47 kg/m   Constitutional:  Alert and oriented, no acute distress, nontoxic appearing HEENT: [place], AT Cardiovascular: No clubbing, cyanosis, or edema Respiratory: Normal respiratory effort, no increased work of breathing Skin: No rashes, bruises or suspicious lesions Neurologic: Grossly intact, no focal deficits, moving all 4 extremities Psychiatric: Normal mood and affect  Laboratory Data: Results for orders placed or performed during the hospital encounter of 10/13/23  Culture,  blood (Routine x 2)   Collection Time: 10/13/23 11:28 PM   Specimen: BLOOD LEFT ARM  Result Value Ref Range   Specimen Description      BLOOD LEFT ARM Performed at [facility], [address] [place], [place] [address]    Special Requests      BOTTLES DRAWN AEROBIC AND ANAEROBIC Blood Culture adequate volume Performed at [facility], [address] [place], [place] [address]    Culture  Setup Time      GRAM NEGATIVE RODS IN BOTH AEROBIC AND ANAEROBIC BOTTLES CRITICAL RESULT CALLED TO, READ BACK BY AND VERIFIED WITH: ERIN MEEGAN PHARMD 1142 10/14/23 HNM GRAM STAIN REVIEWED-AGREE WITH RESULT DRT Performed at [facility], [address] [place], [place] [address]    Culture ESCHERICHIA COLI (A)    Report Status 10/16/2023 FINAL    Organism ID, Bacteria ESCHERICHIA COLI    Organism ID, Bacteria ESCHERICHIA COLI       Susceptibility   Escherichia coli - KIRBY BAUER*    CEFAZOLIN  INTERMEDIATE Intermediate    Escherichia coli - MIC*    AMPICILLIN >=32 RESISTANT Resistant     CEFEPIME  <=0.12 SENSITIVE Sensitive     CEFTAZIDIME <=1 SENSITIVE Sensitive     CEFTRIAXONE  <=0.25 SENSITIVE Sensitive     CIPROFLOXACIN  <=0.25 SENSITIVE  Sensitive     GENTAMICIN <=1 SENSITIVE Sensitive     IMIPENEM 1 SENSITIVE Sensitive     TRIMETH/SULFA <=20 SENSITIVE Sensitive     AMPICILLIN/SULBACTAM >=32 RESISTANT Resistant     PIP/TAZO <=4 SENSITIVE Sensitive ug/mL    * ESCHERICHIA COLI    ESCHERICHIA COLI  Resp panel by RT-PCR (RSV, Flu A&B, Covid) Anterior Nasal Swab   Collection Time: 10/13/23 11:28 PM   Specimen: Anterior Nasal Swab  Result Value Ref Range   SARS Coronavirus 2 by RT PCR NEGATIVE NEGATIVE   Influenza A by PCR NEGATIVE NEGATIVE   Influenza B by PCR NEGATIVE NEGATIVE   Resp Syncytial Virus by PCR NEGATIVE NEGATIVE  Blood Culture ID Panel (Reflexed)   Collection Time: 10/13/23 11:28 PM  Result Value Ref Range   Enterococcus faecalis NOT DETECTED NOT  DETECTED   Enterococcus Faecium NOT DETECTED NOT DETECTED   Listeria monocytogenes NOT DETECTED NOT DETECTED   Staphylococcus species NOT DETECTED NOT DETECTED   Staphylococcus aureus (BCID) NOT DETECTED NOT DETECTED   Staphylococcus epidermidis NOT DETECTED NOT DETECTED   Staphylococcus lugdunensis NOT DETECTED NOT DETECTED   Streptococcus species NOT DETECTED NOT DETECTED   Streptococcus agalactiae NOT DETECTED NOT DETECTED   Streptococcus pneumoniae NOT DETECTED NOT DETECTED   Streptococcus pyogenes NOT DETECTED NOT DETECTED   A.calcoaceticus-baumannii NOT DETECTED NOT DETECTED   Bacteroides fragilis NOT DETECTED NOT DETECTED   Enterobacterales DETECTED (A) NOT DETECTED   Enterobacter cloacae complex NOT DETECTED NOT DETECTED   Escherichia coli DETECTED (A) NOT DETECTED   Klebsiella aerogenes NOT DETECTED NOT DETECTED   Klebsiella oxytoca NOT DETECTED NOT DETECTED   Klebsiella pneumoniae NOT DETECTED NOT DETECTED   Proteus species NOT DETECTED NOT DETECTED   Salmonella species NOT DETECTED NOT DETECTED   Serratia marcescens NOT DETECTED NOT DETECTED   Haemophilus influenzae NOT DETECTED NOT DETECTED   Neisseria meningitidis NOT DETECTED NOT DETECTED   Pseudomonas aeruginosa NOT DETECTED NOT DETECTED   Stenotrophomonas maltophilia NOT DETECTED NOT DETECTED   Candida albicans NOT DETECTED NOT DETECTED   Candida auris NOT DETECTED NOT DETECTED   Candida glabrata NOT DETECTED NOT DETECTED   Candida krusei NOT DETECTED NOT DETECTED   Candida parapsilosis NOT DETECTED NOT DETECTED   Candida tropicalis NOT DETECTED NOT DETECTED   Cryptococcus neoformans/gattii NOT DETECTED NOT DETECTED   CTX-M ESBL NOT DETECTED NOT DETECTED   Carbapenem resistance IMP NOT DETECTED NOT DETECTED   Carbapenem resistance KPC NOT DETECTED NOT DETECTED   Carbapenem resistance NDM NOT DETECTED NOT DETECTED   Carbapenem resist OXA 48 LIKE NOT DETECTED NOT DETECTED   Carbapenem resistance VIM NOT DETECTED  NOT DETECTED  Comprehensive metabolic panel   Collection Time: 10/13/23 11:28 PM  Result Value Ref Range   Sodium 135 135 - 145 mmol/L   Potassium 3.5 3.5 - 5.1 mmol/L   Chloride 97 (L) 98 - 111 mmol/L   CO2 24 22 - 32 mmol/L   Glucose, Bld 186 (H) 70 - 99 mg/dL   BUN 9 8 - 23 mg/dL   Creatinine, Ser 9.03 0.61 - 1.24 mg/dL   Calcium  9.3 8.9 - 10.3 mg/dL   Total Protein 7.1 6.5 - 8.1 g/dL   Albumin 3.9 3.5 - 5.0 g/dL   AST 50 (H) 15 - 41 U/L   ALT 37 0 - 44 U/L   Alkaline Phosphatase 79 38 - 126 U/L   Total Bilirubin 1.1 0.0 - 1.2 mg/dL   GFR, Estimated >39 >39 mL/min  Anion gap 14 5 - 15  Lactic acid, plasma   Collection Time: 10/13/23 11:28 PM  Result Value Ref Range   Lactic Acid, Venous 2.3 (HH) 0.5 - 1.9 mmol/L  CBC with Differential   Collection Time: 10/13/23 11:28 PM  Result Value Ref Range   WBC 11.6 (H) 4.0 - 10.5 K/uL   RBC 3.76 (L) 4.22 - 5.81 MIL/uL   Hemoglobin 12.9 (L) 13.0 - 17.0 g/dL   HCT 62.7 (L) 60.9 - 47.9 %   MCV 98.9 80.0 - 100.0 fL   MCH 34.3 (H) 26.0 - 34.0 pg   MCHC 34.7 30.0 - 36.0 g/dL   RDW 85.9 88.4 - 84.4 %   Platelets 203 150 - 400 K/uL   nRBC 0.0 0.0 - 0.2 %   Neutrophils Relative % 87 %   Neutro Abs 10.0 (H) 1.7 - 7.7 K/uL   Lymphocytes Relative 7 %   Lymphs Abs 0.9 0.7 - 4.0 K/uL   Monocytes Relative 5 %   Monocytes Absolute 0.6 0.1 - 1.0 K/uL   Eosinophils Relative 0 %   Eosinophils Absolute 0.0 0.0 - 0.5 K/uL   Basophils Relative 0 %   Basophils Absolute 0.0 0.0 - 0.1 K/uL   Immature Granulocytes 1 %   Abs Immature Granulocytes 0.11 (H) 0.00 - 0.07 K/uL  Protime-INR   Collection Time: 10/13/23 11:28 PM  Result Value Ref Range   Prothrombin Time 13.3 11.4 - 15.2 seconds   INR 1.0 0.8 - 1.2  Urine Culture   Collection Time: 10/14/23 12:09 AM   Specimen: Urine, Random  Result Value Ref Range   Specimen Description      URINE, RANDOM Performed at [facility], [address] [place], [place] [address]    Special  Requests      NONE Reflexed from [id number] Performed at [facility], [address], [place], [place] [address]    Culture      NO GROWTH Performed at [facility], [address] [place], [place] [address]    Report Status 10/15/2023 FINAL   Urinalysis, w/ Reflex to Culture (Infection Suspected) -Urine, Clean Catch   Collection Time: 10/14/23 12:09 AM  Result Value Ref Range   Specimen Source URINE, CATHETERIZED    Color, Urine STRAW (A) YELLOW   APPearance CLEAR (A) CLEAR   Specific Gravity, Urine 1.009 1.005 - 1.030   pH 7.0 5.0 - 8.0   Glucose, UA NEGATIVE NEGATIVE mg/dL   Hgb urine dipstick MODERATE (A) NEGATIVE   Bilirubin Urine NEGATIVE NEGATIVE   Ketones, ur NEGATIVE NEGATIVE mg/dL   Protein, ur NEGATIVE NEGATIVE mg/dL   Nitrite NEGATIVE NEGATIVE   Leukocytes,Ua MODERATE (A) NEGATIVE   RBC / HPF 0-5 0 - 5 RBC/hpf   WBC, UA 21-50 0 - 5 WBC/hpf   Bacteria, UA FEW (A) NONE SEEN   Squamous Epithelial / HPF 0 0 - 5 /HPF   Mucus PRESENT   Culture, blood (Routine x 2)   Collection Time: 10/14/23 12:12 AM   Specimen: BLOOD  Result Value Ref Range   Specimen Description BLOOD BLOOD RIGHT HAND    Special Requests      BOTTLES DRAWN AEROBIC AND ANAEROBIC Blood Culture results may not be optimal due to an excessive volume of blood received in culture bottles   Culture      NO GROWTH 5 DAYS Performed at [facility], [address], [place], [place] [address]    Report Status  10/19/2023 FINAL   Lactic acid, plasma   Collection Time: 10/14/23  2:22 AM  Result Value Ref Range   Lactic Acid, Venous 7.4 (HH) 0.5 - 1.9 mmol/L  Lactic acid, plasma   Collection Time: 10/14/23  5:29 AM  Result Value Ref Range   Lactic Acid, Venous 2.7 (HH) 0.5 - 1.9 mmol/L  Lactic acid, plasma   Collection Time: 10/14/23  8:23 AM  Result Value Ref Range   Lactic Acid, Venous 2.1 (HH) 0.5 - 1.9 mmol/L  Comprehensive metabolic panel with GFR   Collection Time: 10/14/23   8:23 AM  Result Value Ref Range   Sodium 138 135 - 145 mmol/L   Potassium 3.5 3.5 - 5.1 mmol/L   Chloride 100 98 - 111 mmol/L   CO2 26 22 - 32 mmol/L   Glucose, Bld 178 (H) 70 - 99 mg/dL   BUN 8 8 - 23 mg/dL   Creatinine, Ser 8.95 0.61 - 1.24 mg/dL   Calcium  8.6 (L) 8.9 - 10.3 mg/dL   Total Protein 5.9 (L) 6.5 - 8.1 g/dL   Albumin 3.2 (L) 3.5 - 5.0 g/dL   AST 49 (H) 15 - 41 U/L   ALT 35 0 - 44 U/L   Alkaline Phosphatase 69 38 - 126 U/L   Total Bilirubin 1.0 0.0 - 1.2 mg/dL   GFR, Estimated >39 >39 mL/min   Anion gap 12 5 - 15  CBC   Collection Time: 10/14/23  8:23 AM  Result Value Ref Range   WBC 14.4 (H) 4.0 - 10.5 K/uL   RBC 3.56 (L) 4.22 - 5.81 MIL/uL   Hemoglobin 12.0 (L) 13.0 - 17.0 g/dL   HCT 64.1 (L) 60.9 - 47.9 %   MCV 100.6 (H) 80.0 - 100.0 fL   MCH 33.7 26.0 - 34.0 pg   MCHC 33.5 30.0 - 36.0 g/dL   RDW 85.8 88.4 - 84.4 %   Platelets 162 150 - 400 K/uL   nRBC 0.1 0.0 - 0.2 %  Comprehensive metabolic panel with GFR   Collection Time: 10/15/23  4:13 AM  Result Value Ref Range   Sodium 137 135 - 145 mmol/L   Potassium 3.3 (L) 3.5 - 5.1 mmol/L   Chloride 102 98 - 111 mmol/L   CO2 23 22 - 32 mmol/L   Glucose, Bld 168 (H) 70 - 99 mg/dL   BUN 7 (L) 8 - 23 mg/dL   Creatinine, Ser 9.23 0.61 - 1.24 mg/dL   Calcium  8.8 (L) 8.9 - 10.3 mg/dL   Total Protein 6.0 (L) 6.5 - 8.1 g/dL   Albumin 3.1 (L) 3.5 - 5.0 g/dL   AST 50 (H) 15 - 41 U/L   ALT 31 0 - 44 U/L   Alkaline Phosphatase 68 38 - 126 U/L   Total Bilirubin 1.0 0.0 - 1.2 mg/dL   GFR, Estimated >39 >39 mL/min   Anion gap 12 5 - 15  CBC   Collection Time: 10/15/23  4:13 AM  Result Value Ref Range   WBC 8.9 4.0 - 10.5 K/uL   RBC 3.65 (L) 4.22 - 5.81 MIL/uL   Hemoglobin 12.2 (L) 13.0 - 17.0 g/dL   HCT 63.3 (L) 60.9 - 47.9 %   MCV 100.3 (H) 80.0 - 100.0 fL   MCH 33.4 26.0 - 34.0 pg   MCHC 33.3 30.0 - 36.0 g/dL   RDW 86.2 88.4 - 84.4 %   Platelets 172 150 - 400 K/uL   nRBC 0.0 0.0 - 0.2 %  Comprehensive  metabolic panel with GFR   Collection Time: 10/17/23  8:44 AM  Result Value Ref Range   Sodium 140 135 - 145 mmol/L   Potassium 3.8 3.5 - 5.1 mmol/L   Chloride 107 98 - 111 mmol/L   CO2 27 22 - 32 mmol/L   Glucose, Bld 122 (H) 70 - 99 mg/dL   BUN 7 (L) 8 - 23 mg/dL   Creatinine, Ser 9.31 0.61 - 1.24 mg/dL   Calcium  8.8 (L) 8.9 - 10.3 mg/dL   Total Protein 5.8 (L) 6.5 - 8.1 g/dL   Albumin 2.9 (L) 3.5 - 5.0 g/dL   AST 65 (H) 15 - 41 U/L   ALT 33 0 - 44 U/L   Alkaline Phosphatase 57 38 - 126 U/L   Total Bilirubin 0.8 0.0 - 1.2 mg/dL   GFR, Estimated >39 >39 mL/min   Anion gap 6 5 - 15  CBC with Differential/Platelet   Collection Time: 10/17/23  8:44 AM  Result Value Ref Range   WBC 6.2 4.0 - 10.5 K/uL   RBC 3.56 (L) 4.22 - 5.81 MIL/uL   Hemoglobin 11.8 (L) 13.0 - 17.0 g/dL   HCT 64.7 (L) 60.9 - 47.9 %   MCV 98.9 80.0 - 100.0 fL   MCH 33.1 26.0 - 34.0 pg   MCHC 33.5 30.0 - 36.0 g/dL   RDW 86.1 88.4 - 84.4 %   Platelets 185 150 - 400 K/uL   nRBC 0.0 0.0 - 0.2 %   Neutrophils Relative % 71 %   Neutro Abs 4.4 1.7 - 7.7 K/uL   Lymphocytes Relative 17 %   Lymphs Abs 1.0 0.7 - 4.0 K/uL   Monocytes Relative 10 %   Monocytes Absolute 0.6 0.1 - 1.0 K/uL   Eosinophils Relative 1 %   Eosinophils Absolute 0.1 0.0 - 0.5 K/uL   Basophils Relative 0 %   Basophils Absolute 0.0 0.0 - 0.1 K/uL   Immature Granulocytes 1 %   Abs Immature Granulocytes 0.07 0.00 - 0.07 K/uL   Pertinent Imaging: CTAP with contrast, 10/14/2023: CLINICAL DATA:  Right lower quadrant pain. Weakness, dizziness and fever. Possible sepsis.   EXAM: CT ABDOMEN AND PELVIS WITH CONTRAST   TECHNIQUE: Multidetector CT imaging of the abdomen and pelvis was performed using the standard protocol following bolus administration of intravenous contrast.   RADIATION DOSE REDUCTION: This exam was performed according to the departmental dose-optimization program which includes automated exposure control, adjustment of the  mA and/or kV according to patient size and/or use of iterative reconstruction technique.   CONTRAST:  OMNIPAQUE  IOHEXOL  300 MG/ML  SOLN   COMPARISON:  CT with IV contrast 08/06/2022 and 11/08/2021.   FINDINGS: Lower chest: Linear scarring or atelectasis both lung bases. Mild elevation right hemidiaphragm.   The cardiac size is normal. 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 Small hiatal hernia.   Hepatobiliary: The liver is 22 cm length with mild-to-moderate steatosis. The gallbladder is absent, without bile duct dilatation.   Pancreas: No abnormality.   Spleen: No abnormality.   Adrenals/Urinary Tract: There is no adrenal mass. There is a stable 1.3 cm Bosniak 2 cyst in the upper pole left kidney, Hounsfield density is 30, stable subcentimeter Bosniak 2 cyst in the posterior right kidney is too small to characterize.   No follow-up imaging is recommended. Both kidneys are otherwise homogeneous.   There are occasional bilateral punctate nonobstructive caliceal stones in the kidneys.  There are no ureteral stones and no hydronephrosis. In the lower pole of the left kidney there is subtle asymmetric hypoenhancement anteriorly concerning for pyelonephritis, with asymmetric increased left perinephric edema.   Additionally there is mild urothelial thickening in the left ureter suggesting an ascending UTI.   There is mild bladder thickening with slight perivesical stranding concerning for cystitis.   Stomach/Bowel: No dilatation or wall thickening, including the appendix.   There is a surgical colocolic anastomosis in the sigmoid segment, scattered colonic diverticulosis without evidence of an acute diverticulitis.   Vascular/Lymphatic: There is moderate aortoiliac calcific plaque without aneurysm. Visceral branch vessel atherosclerosis. No adenopathy is seen.   Reproductive: Surgically absent  prostate. No mass in the prostate bed. Both testicles in the scrotal sac.   Other: Umbilical and left inguinal fat hernias. Unchanged. No incarcerated hernia. No free fluid, free hemorrhage or free air.   Musculoskeletal: There is osteopenia with degenerative changes of the spine. Mild hip DJD.   IMPRESSION: 1. Findings concerning for cystitis and ascending UTI on the left. 2. Subtle asymmetric hypoenhancement in the lower pole of the left kidney anteriorly concerning for pyelonephritis. 3. Nonobstructive nephrolithiasis. 4. Diverticulosis without evidence of diverticulitis. 5. Aortic and coronary artery atherosclerosis. 6. Umbilical and left inguinal fat hernias. 7. Osteopenia and degenerative change. 8. Small hiatal hernia.   Aortic Atherosclerosis (ICD10-I70.0).     Electronically Signed   By: Francis Quam M.D.   On: 10/14/2023 01:40  I personally reviewed the images referenced above and note bilateral nonobstructing punctate renal stones.  Assessment & Plan:   1. Nephrolithiasis (Primary) Bilateral, nonobstructing punctate renal stones.  I recommended against treating these today due to their small size and nonobstructing position and he agreed.  Will continue to monitor.  2. Complicated UTI (urinary tract infection) Resolved, UA bland though contaminated today.  No persistent hematuria to warrant further workup.  I do suspect his UTI is secondary to #3 below. - Urinalysis, Complete  3. Benign prostatic hyperplasia with weak urinary stream He was previously found to have lateral lobe enlargement on cystoscopy, though overall his prostate is rather small on recent CT scan.  PDE 5 inhibitors are contraindicated given his nitrate use.  Flomax  contraindicated due to baseline dizziness/lightheadedness.  Will start silodosin  as an alternative and see him back in 6 weeks for symptom recheck and PVR. - silodosin  (RAPAFLO ) 8 MG CAPS capsule; Take 1 capsule (8 mg total) by mouth  daily with breakfast.  Dispense: 90 capsule; Refill: 3   Return in about 6 weeks (around 12/09/2023) for IPSS/PVR.  Lucie Hones, PA-C  [facility] [address], [address] [place], [place] [address] [phone]

## 2023-11-03 ENCOUNTER — Telehealth: Payer: Self-pay

## 2023-11-03 ENCOUNTER — Other Ambulatory Visit: Payer: Self-pay | Admitting: Cardiovascular Disease

## 2023-11-03 NOTE — Telephone Encounter (Signed)
 Pt's pharmacy is requesting a refill on medication torsemide . This medication is on pause. Does Dr. Darron want pt to resume this medication? Please address

## 2023-11-03 NOTE — Telephone Encounter (Signed)
 Incoming call on triage line pt states that over the weekend he had another episode of gross hematuria, he questions if he should be seen sooner. Denies fever, chills, or dysuria.

## 2023-11-04 ENCOUNTER — Other Ambulatory Visit: Payer: Self-pay | Admitting: Cardiovascular Disease

## 2023-11-04 DIAGNOSIS — I5022 Chronic systolic (congestive) heart failure: Secondary | ICD-10-CM

## 2023-11-04 DIAGNOSIS — I10 Essential (primary) hypertension: Secondary | ICD-10-CM

## 2023-11-04 NOTE — Telephone Encounter (Signed)
 Called pt to inquire further about symptoms, no answer. Per Willie Clark if hematuria is stable patient can wait until f/u to be seen.

## 2023-11-06 NOTE — Telephone Encounter (Signed)
 Pt's pharmacy is requesting a refill on medications isosorbide , potassium and spironolactone . These medications were put on pause. Would Dr. Darron like to resume these medications? Please address

## 2023-11-14 NOTE — Progress Notes (Signed)
 Cardiology Office Note    Date:  11/17/2023   ID:  Willie Clark, DOB 06/20/1950, MRN [id number]  PCP:  [facility]  Cardiologist:  Deatrice Cage, MD  Electrophysiologist:  None   Chief Complaint: Follow-up  History of Present Illness:   Willie Clark is a 73 y.o. 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  Regarding his CAD, he underwent CABG in 1993 with redo CABG in 2007.  He was admitted in 11/2019 with chest pain.  Echo showed an EF of 40 to 45%.  LHC showed severe underlying three-vessel coronary arteries with patent grafts and no significant change since 2017.  The patient antihypertensive medications were switched to losartan  and carvedilol .  Rosuvastatin  was increased to 40 mg daily.  Losartan  was subsequently switched to Entresto .  However, the patient had allergic reaction with rash and itching that had to be treated in the emergency room.  Spironolactone  was subsequently added.  He was seen on March of 2022 with symptoms of unstable angina.  His EKG was worrisome for inferior ST elevation myocardial infarction and thus he was transferred emergently to the cath lab where he underwent cardiac cath which showed severe native three-vessel coronary artery disease with patent LCx stent with mild to moderate in-stent restenosis, widely patent LIMA to LAD, patent SVG to OM2 with 60 to 70% proximal graft stenosis and subtotally occluded SVG to RCA with long segment of proximal graft disease and 99% stenosis at the distal anastomosis.  He underwent successful PCI to SVG to RCA with overlapping resolute Onyx drug-eluting stents.  Echo during that admission showed an EF  of 35 to 40%.  He was admitted in 10/2020 with increased shortness of breath and chest pain LHC showed showed significant underlying three-vessel coronary artery disease with patent left circumflex stent with moderate in-stent restenosis, patent LIMA to LAD, patent SVG to right PDA with patent stents and patent SVG to OM with sequential 75% and 60% ostial and mid graft stenoses. Right heart catheterization showed mildly elevated filling pressures with mildly reduced cardiac output. PCI with 2 nonoverlapping drug-eluting stent placement was done to SVG to OM.  Echo in 05/2021 showed an EF of 45 to 50%, global hypokinesis, mildly dilated LV internal cavity size, mild LVH of the lateral segment, grade 1 diastolic dysfunction, normal RV systolic function with moderately enlarged ventricular cavity size, mild mitral regurgitation, and trivial aortic insufficiency.  Lexiscan  MPI in 06/2021 showed a small region of ischemia in the inferolateral wall with an EF of 53% and was overall low to moderate risk.  He was last seen in the office in 03/2023 and doing reasonably well with chronic chest pain that was unchanged from baseline with some increased shortness of breath and fatigue.  It was noted that his losartan  had been resumed and he was having issues with intermittent symptomatic hypotension with recommendation to again discontinue losartan .  He was admitted to the hospital in 09/2023 with severe sepsis secondary to acute pyelonephritis complicated by recurrent nephrolithiasis and Enterobacterales bacteremia.  At time of discharge his Imdur , spironolactone , torsemide , and KCl were all held and have not yet been resumed.  He comes in today accompanied by his wife and reports progression  of exertional chest pain and shortness of breath with exertional nausea and vomiting over the past 2 to 3 months with exertional dyspnea being more pronounced over the past 1 month.  He also notes an increase in lower extremity swelling  since he has been off of spironolactone  and torsemide  with swelling more progressive throughout the day.  No progressive orthopnea.  No dizziness, presyncope, or syncope.  No falls or symptoms concerning for bleeding.  He has been off of Jardiance  for approximately 4 to 5 months due to financial constraints.   Labs independently reviewed: 09/2023 - Hgb 11.8, PLT 185, potassium 3.8, BUN 7, serum creatinine 0.60, albumin 2.9, AST 65, ALT normal 02/2023 - TSH normal, A1c 6.8 01/2020 - TC 165, TG 317, HDL 41, LDL 61   Past Medical History:  Diagnosis Date   AAA (abdominal aortic aneurysm) (HCC) 02/23/2020   a.) AAA duplex 02/23/2020: measured 3.0 cm   Acute coronary syndrome (HCC)    Anxiety    Aortic atherosclerosis (HCC)    Arthritis    ASD (atrial septal defect) 01/20/2006   a.) noted on intraoperative TEE; s/p closure   Barrett esophagus    Barrett's esophagus    Bilateral leg edema    Bilateral occipital neuralgia    Bowel obstruction (HCC)    Carpal tunnel syndrome    CHF (congestive heart failure) (HCC)    Chronic chest pain    COPD (chronic obstructive pulmonary disease) (HCC)    Coronary artery disease    a.)CABG 93; Redo 07; b.)LHC 03/15: 100 mLAD, 60 pLCx, 100 OM1, 80 pRCA, 100 mRCA - med mgmt. c.)LHC 6/17: LAD 100ost,  LCx 60p, OM1 100, 100 oRCA, VG-OM2 min irreg, VG-RPDA 30ost, 20d - med mgmt; d.)LHC 8/21: 100 oLAD, 100 oOM1-OM1, 100 o-pRCA, 100 RPL2, 100 RPL3, 100 OM2; pat grafts - med mgmt; e.)LHC 3/22: 60-70 o-pVG-OM2, 80-99 VG-RCA (DES x3); f.)R/LHC 07/22: 75 (ost) & 60 (mid) VG-OM (DES x2)   Depression    Diverticulitis    Dyshidrosis    Dysphagia    Enlarged prostate without lower urinary tract symptoms (luts)    Erectile dysfunction    Fibromyalgia    Gastric ulcer    Gastritis    GERD (gastroesophageal reflux disease)    Headache    Hemorrhoids    HFrEF (heart failure with reduced ejection fraction) (HCC)    a.) 09/2016 Echo: EF 55-60%, no rwma, triv AI, mild  MR, mildly dil LA. -> as of April 2020: EF 40 to 45%.  Septal dyssynergy/hypokinesis due to postop state but otherwise unable to assess wall motion Sharol due to poor study. b.) TTE 12/12/2019: EF 40-45%, LAE, mild AR, G1DD; c.) TTE 06/20/2020: EF 35-40%, sev inferolateral HK, mild AR, G1DD.   History of BPH    History of kidney stones    History of myocardial infarct at age less than 60 years    Hyperlipidemia    Hypertension    Hyponatremia    Idiopathic urticaria    Intermittent claudication (HCC)    Kidney stones    Left ventricular failure (HCC)    Lightheadedness    Liver disease    Long term current use of antithrombotics/antiplatelets    a.) daily DAPT therapy (ASA + clopidogrel )   Mass on back    Migraine without aura    Myocardial infarction (HCC) 1993   Nail dystrophy    Nephrolithiasis    Obstructive uropathy    Orthostatic hypotension    a.  Improved after discontinuation of metoprolol .   OSA on CPAP    Peripheral neuropathy    Peripheral neuropathy    Peyronie disease    Postoperative atrial fibrillation/flutter (HCC) 01/20/2006   a.) POD2 following re-do CABG; Tx'd with IV amiodarone, which did not convert; EF 20% on POD3; DCCV (100 J x 1) POD4.   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   a.) 2v CABG: LIMA-LAD, SVG-D2   S/P cervical spinal fusion    S/P RE-DO CABG x 2 01/20/2006   a.) 2v REDO CABG: reserse SVG-PDA, SVG-OM1   Sinus bradycardia    Skin lesions, generalized    Small bowel obstruction (HCC) 02/2009   ST elevation myocardial infarction (STEMI) of inferior wall (HCC) 06/2020   a.) LHC 06/2020 --> subtotally occluded SVG-RCA with long segment of proximal graft disease and 99% stenosis at the distal anastomosis --> successful PCI to SVG to RCA with overlapping Resolute Onyx DES x 3   Stroke [facility]) 1993   right side weakness no blood thinners   on Aspirin     Syncope and collapse    Synovial cyst of lumbar facet joint    TIA (transient ischemic attack) 08/13/2014   Urticaria     Past Surgical History:  Procedure Laterality Date   BACK SURGERY     BIOPSY  04/22/2023   Procedure: BIOPSY;  Surgeon: Maryruth Ole DASEN, MD;  Location: [facility] ENDOSCOPY;  Service: Endoscopy;;  Gastric and Colonic   bowel obstruction  01/2009   CARDIAC CATHETERIZATION  11/2006   patent grafts. Significant OM3 disease and occluded diagonals.   CARDIAC CATHETERIZATION  06/2010   patent grafts. significant ISR in proximal LCX but OM2 is bypassed and gives retrograde flow to OM3.    CARDIAC CATHETERIZATION  06/2013   [facility]: Patent grafts with 60% proximal in-stent restenosis in the left circumflex with FFR of 0.85   CARDIAC CATHETERIZATION Left 09/25/2015   Procedure: Left Heart Cath and Cors/Grafts Angiography;  Surgeon: Deatrice DELENA Cage, MD;  Location: [facility] INVASIVE CV LAB;  Service: Cardiovascular;  Laterality: Left;   CATARACT EXTRACTION     CERVICAL FUSION     CHOLECYSTECTOMY     COLON SURGERY  01/2009   BOWEL RESECTION DUE TO SMALL BOWEL OBSTRUCTION   COLONOSCOPY     COLONOSCOPY WITH PROPOFOL  N/A 10/28/2017   Procedure: COLONOSCOPY WITH PROPOFOL ;  Surgeon: Gaylyn Gladis PENNER, MD;  Location: [facility] ENDOSCOPY;  Service: Endoscopy;  Laterality: N/A;   COLONOSCOPY WITH PROPOFOL  N/A 07/30/2021   Procedure: COLONOSCOPY WITH PROPOFOL ;  Surgeon: Maryruth Ole DASEN, MD;  Location: [facility] ENDOSCOPY;  Service: Endoscopy;  Laterality: N/A;   COLONOSCOPY WITH PROPOFOL  N/A 04/22/2023   Procedure: COLONOSCOPY WITH PROPOFOL ;  Surgeon: Maryruth Ole DASEN, MD;  Location: [facility] ENDOSCOPY;  Service: Endoscopy;  Laterality: N/A;   CORONARY ANGIOPLASTY  2006   CORONARY ARTERY BYPASS GRAFT  1993/01/2006   redo at [facility] LIMA to LAD, SVG to OM2 and SVG to RPDA   CORONARY STENT INTERVENTION N/A 06/20/2020   Procedure: CORONARY STENT INTERVENTION;  Surgeon: Mady Bruckner, MD;  Location:  [facility] INVASIVE CV LAB;  Service: Cardiovascular;  Laterality: N/A;   CORONARY STENT INTERVENTION N/A 11/15/2020   Procedure: CORONARY STENT INTERVENTION;  Surgeon: Mady Bruckner, MD;  Location: [facility] INVASIVE CV LAB;  Service: Cardiovascular;  Laterality: N/A;   CYSTOSCOPY WITH STENT PLACEMENT Bilateral 09/24/2015   Procedure:  CYSTOSCOPY WITH BILATERAL RETROGRADES, BILATERAL STENT PLACEMENT;  Surgeon: Donnice Brooks, MD;  Location: [facility] ORS;  Service: Urology;  Laterality: Bilateral;   ESOPHAGOGASTRODUODENOSCOPY N/A 10/25/2014   Procedure: ESOPHAGOGASTRODUODENOSCOPY (EGD);  Surgeon: Gladis RAYMOND Mariner, MD;  Location: [facility] ENDOSCOPY;  Service: Endoscopy;  Laterality: N/A;   ESOPHAGOGASTRODUODENOSCOPY (EGD) WITH PROPOFOL  N/A 02/03/2017   Procedure: ESOPHAGOGASTRODUODENOSCOPY (EGD) WITH PROPOFOL ;  Surgeon: Mariner Gladis RAYMOND, MD;  Location: [facility] ENDOSCOPY;  Service: Endoscopy;  Laterality: N/A;   ESOPHAGOGASTRODUODENOSCOPY (EGD) WITH PROPOFOL  N/A 04/18/2017   Procedure: ESOPHAGOGASTRODUODENOSCOPY (EGD) WITH PROPOFOL ;  Surgeon: Mariner Gladis RAYMOND, MD;  Location: [facility] ENDOSCOPY;  Service: Endoscopy;  Laterality: N/A;   ESOPHAGOGASTRODUODENOSCOPY (EGD) WITH PROPOFOL  N/A 04/22/2023   Procedure: ESOPHAGOGASTRODUODENOSCOPY (EGD) WITH PROPOFOL ;  Surgeon: Maryruth Ole DASEN, MD;  Location: [facility] ENDOSCOPY;  Service: Endoscopy;  Laterality: N/A;   EYE SURGERY Bilateral    Cataract Extraction with IOL   HERNIA REPAIR     INGUINAL HERNIA REPAIR Bilateral 01/23/2016   Procedure: HERNIA REPAIR INGUINAL ADULT BILATERAL;  Surgeon: Larinda Unknown Sharps, MD;  Location: [facility] ORS;  Service: General;  Laterality: Bilateral;   LEFT HEART CATH AND CORS/GRAFTS ANGIOGRAPHY N/A 12/13/2019   Procedure: LEFT HEART CATH AND CORS/GRAFTS ANGIOGRAPHY;  Surgeon: Darron Deatrice LABOR, MD;  Location: [facility] INVASIVE CV LAB;  Service: Cardiovascular;  Laterality: N/A;   LEFT HEART CATH AND CORS/GRAFTS ANGIOGRAPHY N/A 06/20/2020    Procedure: LEFT HEART CATH AND CORS/GRAFTS ANGIOGRAPHY;  Surgeon: Mady Bruckner, MD;  Location: [facility] INVASIVE CV LAB;  Service: Cardiovascular;  Laterality: N/A;   LUMBAR LAMINECTOMY/DECOMPRESSION MICRODISCECTOMY Left 10/01/2016   Procedure: Left Lumbar four-five Laminotomy for resection of synovial cyst;  Surgeon: Unice Pac, MD;  Location[facility] OR;  Service: Neurosurgery;  Laterality: Left;  left   NECK SURGERY  06/2009   POLYPECTOMY  04/22/2023   Procedure: POLYPECTOMY;  Surgeon: Maryruth Ole DASEN, MD;  Location: [facility] ENDOSCOPY;  Service: Endoscopy;;  Colon   RIGHT/LEFT HEART CATH AND CORONARY/GRAFT ANGIOGRAPHY N/A 11/15/2020   Procedure: RIGHT/LEFT HEART CATH AND CORONARY/GRAFT ANGIOGRAPHY;  Surgeon: Mady Bruckner, MD;  Location: [facility] INVASIVE CV LAB;  Service: Cardiovascular;  Laterality: N/A;   SHOULDER SURGERY Bilateral 2010    Current Medications: Current Meds  Medication Sig   acetaminophen  (TYLENOL ) 500 MG tablet Take 1,000 mg by mouth every 6 (six) hours as needed for mild pain.   albuterol  (VENTOLIN  HFA) 108 (90 Base) MCG/ACT inhaler Inhale 1 puff into the lungs as needed.   aspirin  EC 81 MG EC tablet Take 1 tablet (81 mg total) by mouth daily. Swallow whole.   benzonatate (TESSALON) 100 MG capsule Take 100 mg by mouth 3 (three) times daily as needed for cough.   carvedilol  (COREG ) 3.125 MG tablet Take 1 tablet (3.125 mg total) by mouth 2 (two) times daily with a meal.   citalopram  (CELEXA ) 20 MG tablet Take 20 mg by mouth daily.    clobetasol  cream (TEMOVATE ) 0.05 % APPLY TO ITCHY AREAS ON ELBOWS TWICE A DAY UNTIL IMPROVED. AVOID FACE, GROIN, UNDERARMS.   clopidogrel  (PLAVIX ) 75 MG tablet TAKE 1 TABLET EVERY DAY   EPINEPHrine  0.3 mg/0.3 mL IJ SOAJ injection Inject 0.3 mg into the muscle as needed.   fexofenadine (ALLEGRA) 180 MG tablet Take 180 mg by mouth daily.   gabapentin  (NEURONTIN ) 400 MG capsule Take 400-800 mg by mouth 3 (three) times daily.   Multiple  Vitamins-Minerals (HAIR SKIN AND NAILS FORMULA PO) Take 1 capsule by mouth daily.   nitroGLYCERIN  (NITROSTAT ) 0.4 MG SL  tablet Place 1 tablet (0.4 mg total) under the tongue every 5 (five) minutes as needed for chest pain.   pantoprazole  (PROTONIX ) 40 MG tablet Take 40 mg by mouth 2 (two) times daily.    Probiotic Product (PROBIOTIC PO) Take by mouth daily at 8 pm.   Psyllium (DAILY FIBER PO) Take 1 tablet by mouth daily.   rOPINIRole  (REQUIP ) 1 MG tablet Take 1 mg by mouth 2 (two) times daily.   rosuvastatin  (CRESTOR ) 40 MG tablet Take 1 tablet (40 mg total) by mouth daily.   silodosin  (RAPAFLO ) 8 MG CAPS capsule Take 1 capsule (8 mg total) by mouth daily with breakfast.   traMADol  (ULTRAM ) 50 MG tablet Take 1 tablet (50 mg total) by mouth every 6 (six) hours as needed for up to 15 doses for severe pain or moderate pain.   vitamin B-12 (CYANOCOBALAMIN ) 1000 MCG tablet Take 1,000 mcg by mouth daily.    Allergies:   Amitriptyline, Amoxicillin -pot clavulanate, Cyclobenzaprine, Dicyclomine , Sacubitril-valsartan, Icosapent  ethyl (epa ethyl ester) (fish), Amitriptyline hcl, Bacon flavoring agent (non-screening), Hydrocodone , Fish oil, Lidocaine , Omega-3 fatty acids-vitamin e, Other, Oxycodone -acetaminophen , and Sulfa antibiotics   Social History   Socioeconomic History   Marital status: Married    Spouse name: Romero   Number of children: Not on file   Years of education: Not on file   Highest education level: Not on file  Occupational History   Not on file  Tobacco Use   Smoking status: Former    Current packs/day: 0.00    Types: Cigarettes    Quit date: 08/21/1991    Years since quitting: 32.2   Smokeless tobacco: Never  Vaping Use   Vaping status: Never Used  Substance and Sexual Activity   Alcohol  use: Not Currently   Drug use: No   Sexual activity: Not on file  Other Topics Concern   Not on file  Social History Narrative   Not on file   Social Drivers of Health   Financial  Resource Strain: High Risk (07/17/2023)   Received from [facility]   Overall Financial Resource Strain (CARDIA)    Difficulty of Paying Living Expenses: Hard  Food Insecurity: Patient Unable To Answer (10/14/2023)   Hunger Vital Sign    Worried About Running Out of Food in the Last Year: Patient unable to answer    Ran Out of Food in the Last Year: Patient unable to answer  Transportation Needs: Patient Unable To Answer (10/14/2023)   PRAPARE - Transportation    Lack of Transportation (Medical): Patient unable to answer    Lack of Transportation (Non-Medical): Patient unable to answer  Physical Activity: Not on file  Stress: Not on file  Social Connections: Patient Unable To Answer (10/14/2023)   Social Connection and Isolation Panel    Frequency of Communication with Friends and Family: Patient unable to answer    Frequency of Social Gatherings with Friends and Family: Patient unable to answer    Attends Religious Services: Patient unable to answer    Active Member of Clubs or Organizations: Patient unable to answer    Attends Banker Meetings: Patient unable to answer    Marital Status: Patient unable to answer     Family History:  The patient's family history includes Breast cancer in his paternal aunt; Heart attack in his father; Heart disease (age of onset: 4) in his father.  ROS:   12-point review of systems is negative unless otherwise noted in the HPI.  EKGs/Labs/Other Studies Reviewed:    Studies reviewed were summarized above. The additional studies were reviewed today:  Lexiscan  MPI 07/19/2021: Pharmacological myocardial perfusion imaging study with a small region of ischemia in the inferolateral wall Small fixed defect distal anterior/apical wall Fixed basal to mid septal wall perfusion defect Normal wall motion, EF estimated at 53% GI uptake artifact noted No EKG changes concerning for ischemia at peak stress or in recovery. Low to  moderate risk scan __________  2D echo 06/03/2021: 1. Septal-lateral dyssynchrony. Left ventricular ejection fraction, by  estimation, is 45 to 50%. The left ventricle has mildly decreased  function. The left ventricle demonstrates global hypokinesis. The left  ventricular internal cavity size was mildly  dilated. There is mild left ventricular hypertrophy of the lateral  segment. Left ventricular diastolic parameters are consistent with Grade I  diastolic dysfunction (impaired relaxation).   2. Right ventricular systolic function is normal. The right ventricular  size is moderately enlarged.   3. The mitral valve is normal in structure. Mild mitral valve  regurgitation. No evidence of mitral stenosis.   4. The aortic valve is tricuspid. Aortic valve regurgitation is trivial.  No aortic stenosis is present.  __________   2D echo 11/15/2020: 1. Challenging images, definity  used   2. Left ventricular ejection fraction, by estimation, is 40 to 45 %. The  left ventricle has mildly decreased function. Mild global hypokinesis with  moderate anteroseptal wall hypokinesis possibly secondary to postoperative  state, and inferior wall  hypokinesis.   3. Right ventricular systolic function is normal. The right ventricular  size is normal. There is normal pulmonary artery systolic pressure. The  estimated right ventricular systolic pressure is 24.9 mmHg.   4. Left atrial size was mildly dilated.   5. The mitral valve is normal in structure. Mild mitral valve  regurgitation.  __________     [facility] 11/15/2020: Conclusions: Severe three-vessel coronary artery disease including chronic total occlusions of the ostial LAD, OM1, and proximal RCA. Patent LCx stent with moderate in-stent restenosis (~50%), similar to prior catheterizations. Widely patent LIMA to LAD. Patent SVG to RPDA with patent proximal/mid graft stents that demonstrate mild in-stent restenosis (~10%).  Distal anastomotic stent is  widely patent.  There is significant ectasia in the midportion of the graft. Patent SVG to OM with sequential 75% and 60% ostial and mid graft stenoses. Upper normal to mildly elevated left and right heart filling pressures. Normal pulmonary artery pressure. Mildly reduced Fick cardiac output/index. Successful PCI to SVG to OM with nonoverlapping Resolute Onyx 3.0 x 12 mm (ostial) and 3.0 x 15 mm (mid graft) drug-eluting stents with 0% residual stenosis and TIMI-3 flow.   Recommendations: Overnight observation. Continue dual antiplatelet therapy with aspirin  and ticagrelor  for at least 12 months, ideally longer.  We will plan to CYP2C19 genotype to see if he is an appropriate clopidogrel  responder in case dyspnea persists and alternative therapies need to be considered. Aggressive secondary prevention. Obtain echocardiogram. Optimize goal-directed medical therapy for chronic HFrEF that appears relatively well compensated at this time based on RHC findings. __________   2D echo 06/20/2020: 1. Left ventricular ejection fraction, by estimation, is 35 to 40%. The  left ventricle has moderately decreased function. The left ventricle  demonstrates regional wall motion abnormalities (see scoring  diagram/findings for description). Left ventricular   diastolic parameters are consistent with Grade I diastolic dysfunction  (impaired relaxation). There is severe hypokinesis of the left  ventricular, entire inferolateral wall.   2. Right  ventricular systolic function is low normal. The right  ventricular size is normal.   3. The mitral valve is normal in structure. No evidence of mitral valve  regurgitation.   4. The aortic valve is tricuspid. Aortic valve regurgitation is mild.   5. The inferior vena cava is normal in size with greater than 50%  respiratory variability, suggesting right atrial pressure of 3 mmHg. __________   LHC 06/20/2020: Conclusions: Severe native CAD, including chronic total  occlusions of the ostial LAD and RCA. Patent LCx stent with mild to moderate in-stent restenosis. Widely patent LIMA-LAD. Patent SVG-OM2 with 60-70% ostial/proximal graft stenosis. Subtotally occluded SVG-RCA with long segment of proximal graft disease of up to 80% and 99% stenosis at the distal anastomosis with TIMI-1 flow. Moderately reduced left ventricular contraction with inferior akinesis.  Normal left ventricular filling pressure. Successful PCI to SVG-RCA with overlapping Resolute Onyx 4.0 x 26 mm and 4.0 x 38 mm proximal and 2.5 x 18 mm distal (extending into rPDA) drug-eluting stents.   Recommendations: Continue tirofiban  infusion x 6 hours. Dual antiplatelet therapy with aspirin  and tigagrelor for at least 12 months. Aggressive secondary prevention. If patient has recurrent chest pain, consider PCI to SVG-OM2. Follow-up echocardiogram. __________   LHC 12/13/2019: Ost LAD lesion is 100% stenosed. Ost 1st Mrg to 1st Mrg lesion is 100% stenosed. The left ventricular ejection fraction is 35-45% by visual estimate. There is moderate left ventricular systolic dysfunction. LV end diastolic pressure is mildly elevated. Ost RCA to Prox RCA lesion is 100% stenosed. SVG graft was visualized by angiography and is moderate in size. The graft exhibits mild . LIMA graft was visualized by angiography and is normal in caliber. The graft exhibits no disease. 2nd RPL lesion is 100% stenosed. 3rd RPL lesion is 100% stenosed. SVG graft was visualized by angiography and is normal in caliber. The graft exhibits mild . 2nd Mrg lesion is 100% stenosed. Prox Cx to Mid Cx lesion is 40% stenosed.   1.  Severe underlying three-vessel coronary artery disease with patent grafts including LIMA to LAD, SVG to OM and SVG to right PDA.  Patent stent in the native mid left circumflex with moderate in-stent restenosis.  No significant change in coronary anatomy since most recent cardiac catheterization in  2017.  The patient does have diffuse small vessel disease. 2.  Moderately reduced LV systolic function with an EF of 35 to 40% with mildly elevated left ventricular end-diastolic pressure at 15 mmHg.   Recommendations: Continue medical therapy for coronary artery disease and chronic systolic heart failure.  The patient is not able to tolerate long-acting nitroglycerin  due to headaches. For cardiomyopathy, I discontinued amlodipine  and indapamide  and will start him on small dose carvedilol  and losartan .  We could consider switching to Entresto  as an outpatient. __________   2D echo 12/12/2019: 1. Left ventricular ejection fraction, by estimation, is 40 to 45%. The  left ventricle has mildly decreased function. Left ventricular endocardial  border not optimally defined to evaluate regional wall motion. There is  mild left ventricular hypertrophy.   Left ventricular diastolic parameters are consistent with Grade I  diastolic dysfunction (impaired relaxation).   2. Right ventricular systolic function is normal. The right ventricular  size is normal. There is normal pulmonary artery systolic pressure. The  estimated right ventricular systolic pressure is 25.8 mmHg.   3. Left atrial size was mildly dilated.   4. The mitral valve is normal in structure. No evidence of mitral valve  regurgitation.  No evidence of mitral stenosis.   5. The aortic valve is normal in structure. Aortic valve regurgitation is  mild. Mild aortic valve sclerosis is present, with no evidence of aortic  valve stenosis. __________   2D echo 08/01/2018: 1. The left ventricle has mild-moderately reduced systolic function, with  an ejection fraction of 40 to 45%. The cavity size was mildly dilated.  Septal wall hypokinesis consistent with post-operative state. Unable to  exclude additional regional wall  motion abnormality. Left ventricular diastolic Doppler parameters are  consistent with pseudonormalization.   2. The  right ventricle has normal systolic function. The cavity was  normal. There is no increase in right ventricular wall thickness. Normal  RVSP   3. Small pericardial effusion. __________   See CV Studies for more remote cardiac imaging   EKG:  EKG is ordered today.  The EKG ordered today demonstrates NSR, 60 bpm, nonspecific inferolateral ST-T changes more pronounced from prior tracing  Recent Labs: 11/17/2023: ALT 24; BUN 8; Creatinine, Ser 0.77; Hemoglobin 13.5; Platelets 209; Potassium 4.6; Sodium 142  Recent Lipid Panel    Component Value Date/Time   CHOL 149 11/17/2023 1048   TRIG 249 (H) 11/17/2023 1048   HDL 45 11/17/2023 1048   CHOLHDL 3.3 11/17/2023 1048   VLDL 50 (H) 11/17/2023 1048   LDLCALC 54 11/17/2023 1048    PHYSICAL EXAM:    VS:  BP (!) 168/91 (BP Location: Left Arm, Patient Position: Sitting, Cuff Size: Normal)   Pulse 63   Resp 17   Ht 5' 8 (1.727 m)   Wt 204 lb 6.4 oz (92.7 kg)   SpO2 100%   BMI 31.08 kg/m   BMI: Body mass index is 31.08 kg/m.  Physical Exam Vitals reviewed.  Constitutional:      Appearance: He is well-developed.  HENT:     Head: Normocephalic and atraumatic.  Eyes:     General:        Right eye: No discharge.        Left eye: No discharge.  Cardiovascular:     Rate and Rhythm: Normal rate and regular rhythm.     Heart sounds: S1 normal and S2 normal. Heart sounds not distant. No midsystolic click and no opening snap. Murmur heard.     Systolic murmur is present with a grade of 1/6 at the upper left sternal border.     No friction rub.  Pulmonary:     Effort: Pulmonary effort is normal. No respiratory distress.     Breath sounds: Normal breath sounds. No decreased breath sounds, wheezing, rhonchi or rales.  Chest:     Chest wall: No tenderness.  Musculoskeletal:     Cervical back: Normal range of motion.     Right lower leg: Edema present.     Left lower leg: Edema present.     Comments: 1+ bilateral lower extremity  pitting edema to the knees.   Skin:    General: Skin is warm and dry.     Nails: There is no clubbing.  Neurological:     Mental Status: He is alert and oriented to person, place, and time.  Psychiatric:        Speech: Speech normal.        Behavior: Behavior normal.        Thought Content: Thought content normal.        Judgment: Judgment normal.     Wt Readings from Last 3 Encounters:  11/17/23 204 lb 6.4 oz (  92.7 kg)  10/28/23 200 lb 6.4 oz (90.9 kg)  10/14/23 200 lb 6.4 oz (90.9 kg)     ASSESSMENT & PLAN:   CAD status post CABG with redo CABG status post PCI/DES with accelerating angina: Currently without angina.  Over the past 2 to 3 months he notes exertional dyspnea, chest discomfort, and exertional nausea with vomiting.  In this setting, we will pursue diagnostic LHC given symptoms and extensive cardiac history.  Continue lifelong DAPT with aspirin  and clopidogrel  as long as tolerated.  Resume Imdur  60 mg daily.  Continue Crestor  40 mg.  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  Resume prior dose of torsemide  40 mg daily and spironolactone  25 mg daily.  Continue carvedilol  3.125 mg twice daily.  He has been off Jardiance  for approximately 4 to 5 months.  With recurrent pyelonephritis, would not rechallenge with SGLT2 inhibitor at this time.  Unable to tolerate ARB with intermittent hypotension.  Unable to prescribe ARNI due to allergy and intermittent hypotension.  Obtain echo.  HTN: Blood pressure is elevated in the office today.  We are resuming Imdur  with continuation of carvedilol  3.125 mg twice daily.  Will need close monitoring of blood pressure with history of intermittent hypotension.  HLD: LDL 61 in 2021.  Check lipid panel and LFT.  Remains on rosuvastatin  40 mg.  Carotid artery disease: Carotid artery ultrasound showed less than 40% stenosis bilaterally.   Aspirin  and statin as above.   Informed Consent   Shared Decision Making/Informed Consent{  The risks [stroke (1 in 1000), death (1 in 1000), kidney failure [usually temporary] (1 in 500), bleeding (1 in 200), allergic reaction [possibly serious] (1 in 200)], benefits (diagnostic support and management of coronary artery disease) and alternatives of a cardiac catheterization were discussed in detail with Mr. Eid and he is willing to proceed.        Disposition: F/u with Dr. Darron or an APP in 1-2 weeks.   Medication Adjustments/Labs and Tests Ordered: Current medicines are reviewed at length with the patient today.  Concerns regarding medicines are outlined above. Medication changes, Labs and Tests ordered today are summarized above and listed in the Patient Instructions accessible in Encounters.   Signed, Bernardino Bring, PA-C 11/17/2023 12:12 PM     [facility] [place] [address] [place], [place] [address] [phone]

## 2023-11-14 NOTE — H&P (View-Only) (Signed)
 Cardiology Office Note    Date:  11/17/2023   ID:  Willie Clark, DOB 06/20/1950, MRN [id number]  PCP:  [facility]  Cardiologist:  Deatrice Cage, MD  Electrophysiologist:  None   Chief Complaint: Follow-up  History of Present Illness:   Willie Clark is a 73 y.o. 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  Regarding his CAD, he underwent CABG in 1993 with redo CABG in 2007.  He was admitted in 11/2019 with chest pain.  Echo showed an EF of 40 to 45%.  LHC showed severe underlying three-vessel coronary arteries with patent grafts and no significant change since 2017.  The patient antihypertensive medications were switched to losartan  and carvedilol .  Rosuvastatin  was increased to 40 mg daily.  Losartan  was subsequently switched to Entresto .  However, the patient had allergic reaction with rash and itching that had to be treated in the emergency room.  Spironolactone  was subsequently added.  He was seen on March of 2022 with symptoms of unstable angina.  His EKG was worrisome for inferior ST elevation myocardial infarction and thus he was transferred emergently to the cath lab where he underwent cardiac cath which showed severe native three-vessel coronary artery disease with patent LCx stent with mild to moderate in-stent restenosis, widely patent LIMA to LAD, patent SVG to OM2 with 60 to 70% proximal graft stenosis and subtotally occluded SVG to RCA with long segment of proximal graft disease and 99% stenosis at the distal anastomosis.  He underwent successful PCI to SVG to RCA with overlapping resolute Onyx drug-eluting stents.  Echo during that admission showed an EF  of 35 to 40%.  He was admitted in 10/2020 with increased shortness of breath and chest pain LHC showed showed significant underlying three-vessel coronary artery disease with patent left circumflex stent with moderate in-stent restenosis, patent LIMA to LAD, patent SVG to right PDA with patent stents and patent SVG to OM with sequential 75% and 60% ostial and mid graft stenoses. Right heart catheterization showed mildly elevated filling pressures with mildly reduced cardiac output. PCI with 2 nonoverlapping drug-eluting stent placement was done to SVG to OM.  Echo in 05/2021 showed an EF of 45 to 50%, global hypokinesis, mildly dilated LV internal cavity size, mild LVH of the lateral segment, grade 1 diastolic dysfunction, normal RV systolic function with moderately enlarged ventricular cavity size, mild mitral regurgitation, and trivial aortic insufficiency.  Lexiscan  MPI in 06/2021 showed a small region of ischemia in the inferolateral wall with an EF of 53% and was overall low to moderate risk.  He was last seen in the office in 03/2023 and doing reasonably well with chronic chest pain that was unchanged from baseline with some increased shortness of breath and fatigue.  It was noted that his losartan  had been resumed and he was having issues with intermittent symptomatic hypotension with recommendation to again discontinue losartan .  He was admitted to the hospital in 09/2023 with severe sepsis secondary to acute pyelonephritis complicated by recurrent nephrolithiasis and Enterobacterales bacteremia.  At time of discharge his Imdur , spironolactone , torsemide , and KCl were all held and have not yet been resumed.  He comes in today accompanied by his wife and reports progression  of exertional chest pain and shortness of breath with exertional nausea and vomiting over the past 2 to 3 months with exertional dyspnea being more pronounced over the past 1 month.  He also notes an increase in lower extremity swelling  since he has been off of spironolactone  and torsemide  with swelling more progressive throughout the day.  No progressive orthopnea.  No dizziness, presyncope, or syncope.  No falls or symptoms concerning for bleeding.  He has been off of Jardiance  for approximately 4 to 5 months due to financial constraints.   Labs independently reviewed: 09/2023 - Hgb 11.8, PLT 185, potassium 3.8, BUN 7, serum creatinine 0.60, albumin 2.9, AST 65, ALT normal 02/2023 - TSH normal, A1c 6.8 01/2020 - TC 165, TG 317, HDL 41, LDL 61   Past Medical History:  Diagnosis Date   AAA (abdominal aortic aneurysm) (HCC) 02/23/2020   a.) AAA duplex 02/23/2020: measured 3.0 cm   Acute coronary syndrome (HCC)    Anxiety    Aortic atherosclerosis (HCC)    Arthritis    ASD (atrial septal defect) 01/20/2006   a.) noted on intraoperative TEE; s/p closure   Barrett esophagus    Barrett's esophagus    Bilateral leg edema    Bilateral occipital neuralgia    Bowel obstruction (HCC)    Carpal tunnel syndrome    CHF (congestive heart failure) (HCC)    Chronic chest pain    COPD (chronic obstructive pulmonary disease) (HCC)    Coronary artery disease    a.)CABG 93; Redo 07; b.)LHC 03/15: 100 mLAD, 60 pLCx, 100 OM1, 80 pRCA, 100 mRCA - med mgmt. c.)LHC 6/17: LAD 100ost,  LCx 60p, OM1 100, 100 oRCA, VG-OM2 min irreg, VG-RPDA 30ost, 20d - med mgmt; d.)LHC 8/21: 100 oLAD, 100 oOM1-OM1, 100 o-pRCA, 100 RPL2, 100 RPL3, 100 OM2; pat grafts - med mgmt; e.)LHC 3/22: 60-70 o-pVG-OM2, 80-99 VG-RCA (DES x3); f.)R/LHC 07/22: 75 (ost) & 60 (mid) VG-OM (DES x2)   Depression    Diverticulitis    Dyshidrosis    Dysphagia    Enlarged prostate without lower urinary tract symptoms (luts)    Erectile dysfunction    Fibromyalgia    Gastric ulcer    Gastritis    GERD (gastroesophageal reflux disease)    Headache    Hemorrhoids    HFrEF (heart failure with reduced ejection fraction) (HCC)    a.) 09/2016 Echo: EF 55-60%, no rwma, triv AI, mild  MR, mildly dil LA. -> as of April 2020: EF 40 to 45%.  Septal dyssynergy/hypokinesis due to postop state but otherwise unable to assess wall motion Sharol due to poor study. b.) TTE 12/12/2019: EF 40-45%, LAE, mild AR, G1DD; c.) TTE 06/20/2020: EF 35-40%, sev inferolateral HK, mild AR, G1DD.   History of BPH    History of kidney stones    History of myocardial infarct at age less than 60 years    Hyperlipidemia    Hypertension    Hyponatremia    Idiopathic urticaria    Intermittent claudication (HCC)    Kidney stones    Left ventricular failure (HCC)    Lightheadedness    Liver disease    Long term current use of antithrombotics/antiplatelets    a.) daily DAPT therapy (ASA + clopidogrel )   Mass on back    Migraine without aura    Myocardial infarction (HCC) 1993   Nail dystrophy    Nephrolithiasis    Obstructive uropathy    Orthostatic hypotension    a.  Improved after discontinuation of metoprolol .   OSA on CPAP    Peripheral neuropathy    Peripheral neuropathy    Peyronie disease    Postoperative atrial fibrillation/flutter (HCC) 01/20/2006   a.) POD2 following re-do CABG; Tx'd with IV amiodarone, which did not convert; EF 20% on POD3; DCCV (100 J x 1) POD4.   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   a.) 2v CABG: LIMA-LAD, SVG-D2   S/P cervical spinal fusion    S/P RE-DO CABG x 2 01/20/2006   a.) 2v REDO CABG: reserse SVG-PDA, SVG-OM1   Sinus bradycardia    Skin lesions, generalized    Small bowel obstruction (HCC) 02/2009   ST elevation myocardial infarction (STEMI) of inferior wall (HCC) 06/2020   a.) LHC 06/2020 --> subtotally occluded SVG-RCA with long segment of proximal graft disease and 99% stenosis at the distal anastomosis --> successful PCI to SVG to RCA with overlapping Resolute Onyx DES x 3   Stroke [facility]) 1993   right side weakness no blood thinners   on Aspirin     Syncope and collapse    Synovial cyst of lumbar facet joint    TIA (transient ischemic attack) 08/13/2014   Urticaria     Past Surgical History:  Procedure Laterality Date   BACK SURGERY     BIOPSY  04/22/2023   Procedure: BIOPSY;  Surgeon: Maryruth Ole DASEN, MD;  Location: [facility] ENDOSCOPY;  Service: Endoscopy;;  Gastric and Colonic   bowel obstruction  01/2009   CARDIAC CATHETERIZATION  11/2006   patent grafts. Significant OM3 disease and occluded diagonals.   CARDIAC CATHETERIZATION  06/2010   patent grafts. significant ISR in proximal LCX but OM2 is bypassed and gives retrograde flow to OM3.    CARDIAC CATHETERIZATION  06/2013   [facility]: Patent grafts with 60% proximal in-stent restenosis in the left circumflex with FFR of 0.85   CARDIAC CATHETERIZATION Left 09/25/2015   Procedure: Left Heart Cath and Cors/Grafts Angiography;  Surgeon: Deatrice DELENA Cage, MD;  Location: [facility] INVASIVE CV LAB;  Service: Cardiovascular;  Laterality: Left;   CATARACT EXTRACTION     CERVICAL FUSION     CHOLECYSTECTOMY     COLON SURGERY  01/2009   BOWEL RESECTION DUE TO SMALL BOWEL OBSTRUCTION   COLONOSCOPY     COLONOSCOPY WITH PROPOFOL  N/A 10/28/2017   Procedure: COLONOSCOPY WITH PROPOFOL ;  Surgeon: Gaylyn Gladis PENNER, MD;  Location: [facility] ENDOSCOPY;  Service: Endoscopy;  Laterality: N/A;   COLONOSCOPY WITH PROPOFOL  N/A 07/30/2021   Procedure: COLONOSCOPY WITH PROPOFOL ;  Surgeon: Maryruth Ole DASEN, MD;  Location: [facility] ENDOSCOPY;  Service: Endoscopy;  Laterality: N/A;   COLONOSCOPY WITH PROPOFOL  N/A 04/22/2023   Procedure: COLONOSCOPY WITH PROPOFOL ;  Surgeon: Maryruth Ole DASEN, MD;  Location: [facility] ENDOSCOPY;  Service: Endoscopy;  Laterality: N/A;   CORONARY ANGIOPLASTY  2006   CORONARY ARTERY BYPASS GRAFT  1993/01/2006   redo at [facility] LIMA to LAD, SVG to OM2 and SVG to RPDA   CORONARY STENT INTERVENTION N/A 06/20/2020   Procedure: CORONARY STENT INTERVENTION;  Surgeon: Mady Bruckner, MD;  Location:  [facility] INVASIVE CV LAB;  Service: Cardiovascular;  Laterality: N/A;   CORONARY STENT INTERVENTION N/A 11/15/2020   Procedure: CORONARY STENT INTERVENTION;  Surgeon: Mady Bruckner, MD;  Location: [facility] INVASIVE CV LAB;  Service: Cardiovascular;  Laterality: N/A;   CYSTOSCOPY WITH STENT PLACEMENT Bilateral 09/24/2015   Procedure:  CYSTOSCOPY WITH BILATERAL RETROGRADES, BILATERAL STENT PLACEMENT;  Surgeon: Donnice Brooks, MD;  Location: [facility] ORS;  Service: Urology;  Laterality: Bilateral;   ESOPHAGOGASTRODUODENOSCOPY N/A 10/25/2014   Procedure: ESOPHAGOGASTRODUODENOSCOPY (EGD);  Surgeon: Gladis RAYMOND Mariner, MD;  Location: [facility] ENDOSCOPY;  Service: Endoscopy;  Laterality: N/A;   ESOPHAGOGASTRODUODENOSCOPY (EGD) WITH PROPOFOL  N/A 02/03/2017   Procedure: ESOPHAGOGASTRODUODENOSCOPY (EGD) WITH PROPOFOL ;  Surgeon: Mariner Gladis RAYMOND, MD;  Location: [facility] ENDOSCOPY;  Service: Endoscopy;  Laterality: N/A;   ESOPHAGOGASTRODUODENOSCOPY (EGD) WITH PROPOFOL  N/A 04/18/2017   Procedure: ESOPHAGOGASTRODUODENOSCOPY (EGD) WITH PROPOFOL ;  Surgeon: Mariner Gladis RAYMOND, MD;  Location: [facility] ENDOSCOPY;  Service: Endoscopy;  Laterality: N/A;   ESOPHAGOGASTRODUODENOSCOPY (EGD) WITH PROPOFOL  N/A 04/22/2023   Procedure: ESOPHAGOGASTRODUODENOSCOPY (EGD) WITH PROPOFOL ;  Surgeon: Maryruth Ole DASEN, MD;  Location: [facility] ENDOSCOPY;  Service: Endoscopy;  Laterality: N/A;   EYE SURGERY Bilateral    Cataract Extraction with IOL   HERNIA REPAIR     INGUINAL HERNIA REPAIR Bilateral 01/23/2016   Procedure: HERNIA REPAIR INGUINAL ADULT BILATERAL;  Surgeon: Larinda Unknown Sharps, MD;  Location: [facility] ORS;  Service: General;  Laterality: Bilateral;   LEFT HEART CATH AND CORS/GRAFTS ANGIOGRAPHY N/A 12/13/2019   Procedure: LEFT HEART CATH AND CORS/GRAFTS ANGIOGRAPHY;  Surgeon: Darron Deatrice LABOR, MD;  Location: [facility] INVASIVE CV LAB;  Service: Cardiovascular;  Laterality: N/A;   LEFT HEART CATH AND CORS/GRAFTS ANGIOGRAPHY N/A 06/20/2020    Procedure: LEFT HEART CATH AND CORS/GRAFTS ANGIOGRAPHY;  Surgeon: Mady Bruckner, MD;  Location: [facility] INVASIVE CV LAB;  Service: Cardiovascular;  Laterality: N/A;   LUMBAR LAMINECTOMY/DECOMPRESSION MICRODISCECTOMY Left 10/01/2016   Procedure: Left Lumbar four-five Laminotomy for resection of synovial cyst;  Surgeon: Unice Pac, MD;  Location[facility] OR;  Service: Neurosurgery;  Laterality: Left;  left   NECK SURGERY  06/2009   POLYPECTOMY  04/22/2023   Procedure: POLYPECTOMY;  Surgeon: Maryruth Ole DASEN, MD;  Location: [facility] ENDOSCOPY;  Service: Endoscopy;;  Colon   RIGHT/LEFT HEART CATH AND CORONARY/GRAFT ANGIOGRAPHY N/A 11/15/2020   Procedure: RIGHT/LEFT HEART CATH AND CORONARY/GRAFT ANGIOGRAPHY;  Surgeon: Mady Bruckner, MD;  Location: [facility] INVASIVE CV LAB;  Service: Cardiovascular;  Laterality: N/A;   SHOULDER SURGERY Bilateral 2010    Current Medications: Current Meds  Medication Sig   acetaminophen  (TYLENOL ) 500 MG tablet Take 1,000 mg by mouth every 6 (six) hours as needed for mild pain.   albuterol  (VENTOLIN  HFA) 108 (90 Base) MCG/ACT inhaler Inhale 1 puff into the lungs as needed.   aspirin  EC 81 MG EC tablet Take 1 tablet (81 mg total) by mouth daily. Swallow whole.   benzonatate (TESSALON) 100 MG capsule Take 100 mg by mouth 3 (three) times daily as needed for cough.   carvedilol  (COREG ) 3.125 MG tablet Take 1 tablet (3.125 mg total) by mouth 2 (two) times daily with a meal.   citalopram  (CELEXA ) 20 MG tablet Take 20 mg by mouth daily.    clobetasol  cream (TEMOVATE ) 0.05 % APPLY TO ITCHY AREAS ON ELBOWS TWICE A DAY UNTIL IMPROVED. AVOID FACE, GROIN, UNDERARMS.   clopidogrel  (PLAVIX ) 75 MG tablet TAKE 1 TABLET EVERY DAY   EPINEPHrine  0.3 mg/0.3 mL IJ SOAJ injection Inject 0.3 mg into the muscle as needed.   fexofenadine (ALLEGRA) 180 MG tablet Take 180 mg by mouth daily.   gabapentin  (NEURONTIN ) 400 MG capsule Take 400-800 mg by mouth 3 (three) times daily.   Multiple  Vitamins-Minerals (HAIR SKIN AND NAILS FORMULA PO) Take 1 capsule by mouth daily.   nitroGLYCERIN  (NITROSTAT ) 0.4 MG SL  tablet Place 1 tablet (0.4 mg total) under the tongue every 5 (five) minutes as needed for chest pain.   pantoprazole  (PROTONIX ) 40 MG tablet Take 40 mg by mouth 2 (two) times daily.    Probiotic Product (PROBIOTIC PO) Take by mouth daily at 8 pm.   Psyllium (DAILY FIBER PO) Take 1 tablet by mouth daily.   rOPINIRole  (REQUIP ) 1 MG tablet Take 1 mg by mouth 2 (two) times daily.   rosuvastatin  (CRESTOR ) 40 MG tablet Take 1 tablet (40 mg total) by mouth daily.   silodosin  (RAPAFLO ) 8 MG CAPS capsule Take 1 capsule (8 mg total) by mouth daily with breakfast.   traMADol  (ULTRAM ) 50 MG tablet Take 1 tablet (50 mg total) by mouth every 6 (six) hours as needed for up to 15 doses for severe pain or moderate pain.   vitamin B-12 (CYANOCOBALAMIN ) 1000 MCG tablet Take 1,000 mcg by mouth daily.    Allergies:   Amitriptyline, Amoxicillin -pot clavulanate, Cyclobenzaprine, Dicyclomine , Sacubitril-valsartan, Icosapent  ethyl (epa ethyl ester) (fish), Amitriptyline hcl, Bacon flavoring agent (non-screening), Hydrocodone , Fish oil, Lidocaine , Omega-3 fatty acids-vitamin e, Other, Oxycodone -acetaminophen , and Sulfa antibiotics   Social History   Socioeconomic History   Marital status: Married    Spouse name: Romero   Number of children: Not on file   Years of education: Not on file   Highest education level: Not on file  Occupational History   Not on file  Tobacco Use   Smoking status: Former    Current packs/day: 0.00    Types: Cigarettes    Quit date: 08/21/1991    Years since quitting: 32.2   Smokeless tobacco: Never  Vaping Use   Vaping status: Never Used  Substance and Sexual Activity   Alcohol  use: Not Currently   Drug use: No   Sexual activity: Not on file  Other Topics Concern   Not on file  Social History Narrative   Not on file   Social Drivers of Health   Financial  Resource Strain: High Risk (07/17/2023)   Received from [facility]   Overall Financial Resource Strain (CARDIA)    Difficulty of Paying Living Expenses: Hard  Food Insecurity: Patient Unable To Answer (10/14/2023)   Hunger Vital Sign    Worried About Running Out of Food in the Last Year: Patient unable to answer    Ran Out of Food in the Last Year: Patient unable to answer  Transportation Needs: Patient Unable To Answer (10/14/2023)   PRAPARE - Transportation    Lack of Transportation (Medical): Patient unable to answer    Lack of Transportation (Non-Medical): Patient unable to answer  Physical Activity: Not on file  Stress: Not on file  Social Connections: Patient Unable To Answer (10/14/2023)   Social Connection and Isolation Panel    Frequency of Communication with Friends and Family: Patient unable to answer    Frequency of Social Gatherings with Friends and Family: Patient unable to answer    Attends Religious Services: Patient unable to answer    Active Member of Clubs or Organizations: Patient unable to answer    Attends Banker Meetings: Patient unable to answer    Marital Status: Patient unable to answer     Family History:  The patient's family history includes Breast cancer in his paternal aunt; Heart attack in his father; Heart disease (age of onset: 4) in his father.  ROS:   12-point review of systems is negative unless otherwise noted in the HPI.  EKGs/Labs/Other Studies Reviewed:    Studies reviewed were summarized above. The additional studies were reviewed today:  Lexiscan  MPI 07/19/2021: Pharmacological myocardial perfusion imaging study with a small region of ischemia in the inferolateral wall Small fixed defect distal anterior/apical wall Fixed basal to mid septal wall perfusion defect Normal wall motion, EF estimated at 53% GI uptake artifact noted No EKG changes concerning for ischemia at peak stress or in recovery. Low to  moderate risk scan __________  2D echo 06/03/2021: 1. Septal-lateral dyssynchrony. Left ventricular ejection fraction, by  estimation, is 45 to 50%. The left ventricle has mildly decreased  function. The left ventricle demonstrates global hypokinesis. The left  ventricular internal cavity size was mildly  dilated. There is mild left ventricular hypertrophy of the lateral  segment. Left ventricular diastolic parameters are consistent with Grade I  diastolic dysfunction (impaired relaxation).   2. Right ventricular systolic function is normal. The right ventricular  size is moderately enlarged.   3. The mitral valve is normal in structure. Mild mitral valve  regurgitation. No evidence of mitral stenosis.   4. The aortic valve is tricuspid. Aortic valve regurgitation is trivial.  No aortic stenosis is present.  __________   2D echo 11/15/2020: 1. Challenging images, definity  used   2. Left ventricular ejection fraction, by estimation, is 40 to 45 %. The  left ventricle has mildly decreased function. Mild global hypokinesis with  moderate anteroseptal wall hypokinesis possibly secondary to postoperative  state, and inferior wall  hypokinesis.   3. Right ventricular systolic function is normal. The right ventricular  size is normal. There is normal pulmonary artery systolic pressure. The  estimated right ventricular systolic pressure is 24.9 mmHg.   4. Left atrial size was mildly dilated.   5. The mitral valve is normal in structure. Mild mitral valve  regurgitation.  __________     [facility] 11/15/2020: Conclusions: Severe three-vessel coronary artery disease including chronic total occlusions of the ostial LAD, OM1, and proximal RCA. Patent LCx stent with moderate in-stent restenosis (~50%), similar to prior catheterizations. Widely patent LIMA to LAD. Patent SVG to RPDA with patent proximal/mid graft stents that demonstrate mild in-stent restenosis (~10%).  Distal anastomotic stent is  widely patent.  There is significant ectasia in the midportion of the graft. Patent SVG to OM with sequential 75% and 60% ostial and mid graft stenoses. Upper normal to mildly elevated left and right heart filling pressures. Normal pulmonary artery pressure. Mildly reduced Fick cardiac output/index. Successful PCI to SVG to OM with nonoverlapping Resolute Onyx 3.0 x 12 mm (ostial) and 3.0 x 15 mm (mid graft) drug-eluting stents with 0% residual stenosis and TIMI-3 flow.   Recommendations: Overnight observation. Continue dual antiplatelet therapy with aspirin  and ticagrelor  for at least 12 months, ideally longer.  We will plan to CYP2C19 genotype to see if he is an appropriate clopidogrel  responder in case dyspnea persists and alternative therapies need to be considered. Aggressive secondary prevention. Obtain echocardiogram. Optimize goal-directed medical therapy for chronic HFrEF that appears relatively well compensated at this time based on RHC findings. __________   2D echo 06/20/2020: 1. Left ventricular ejection fraction, by estimation, is 35 to 40%. The  left ventricle has moderately decreased function. The left ventricle  demonstrates regional wall motion abnormalities (see scoring  diagram/findings for description). Left ventricular   diastolic parameters are consistent with Grade I diastolic dysfunction  (impaired relaxation). There is severe hypokinesis of the left  ventricular, entire inferolateral wall.   2. Right  ventricular systolic function is low normal. The right  ventricular size is normal.   3. The mitral valve is normal in structure. No evidence of mitral valve  regurgitation.   4. The aortic valve is tricuspid. Aortic valve regurgitation is mild.   5. The inferior vena cava is normal in size with greater than 50%  respiratory variability, suggesting right atrial pressure of 3 mmHg. __________   LHC 06/20/2020: Conclusions: Severe native CAD, including chronic total  occlusions of the ostial LAD and RCA. Patent LCx stent with mild to moderate in-stent restenosis. Widely patent LIMA-LAD. Patent SVG-OM2 with 60-70% ostial/proximal graft stenosis. Subtotally occluded SVG-RCA with long segment of proximal graft disease of up to 80% and 99% stenosis at the distal anastomosis with TIMI-1 flow. Moderately reduced left ventricular contraction with inferior akinesis.  Normal left ventricular filling pressure. Successful PCI to SVG-RCA with overlapping Resolute Onyx 4.0 x 26 mm and 4.0 x 38 mm proximal and 2.5 x 18 mm distal (extending into rPDA) drug-eluting stents.   Recommendations: Continue tirofiban  infusion x 6 hours. Dual antiplatelet therapy with aspirin  and tigagrelor for at least 12 months. Aggressive secondary prevention. If patient has recurrent chest pain, consider PCI to SVG-OM2. Follow-up echocardiogram. __________   LHC 12/13/2019: Ost LAD lesion is 100% stenosed. Ost 1st Mrg to 1st Mrg lesion is 100% stenosed. The left ventricular ejection fraction is 35-45% by visual estimate. There is moderate left ventricular systolic dysfunction. LV end diastolic pressure is mildly elevated. Ost RCA to Prox RCA lesion is 100% stenosed. SVG graft was visualized by angiography and is moderate in size. The graft exhibits mild . LIMA graft was visualized by angiography and is normal in caliber. The graft exhibits no disease. 2nd RPL lesion is 100% stenosed. 3rd RPL lesion is 100% stenosed. SVG graft was visualized by angiography and is normal in caliber. The graft exhibits mild . 2nd Mrg lesion is 100% stenosed. Prox Cx to Mid Cx lesion is 40% stenosed.   1.  Severe underlying three-vessel coronary artery disease with patent grafts including LIMA to LAD, SVG to OM and SVG to right PDA.  Patent stent in the native mid left circumflex with moderate in-stent restenosis.  No significant change in coronary anatomy since most recent cardiac catheterization in  2017.  The patient does have diffuse small vessel disease. 2.  Moderately reduced LV systolic function with an EF of 35 to 40% with mildly elevated left ventricular end-diastolic pressure at 15 mmHg.   Recommendations: Continue medical therapy for coronary artery disease and chronic systolic heart failure.  The patient is not able to tolerate long-acting nitroglycerin  due to headaches. For cardiomyopathy, I discontinued amlodipine  and indapamide  and will start him on small dose carvedilol  and losartan .  We could consider switching to Entresto  as an outpatient. __________   2D echo 12/12/2019: 1. Left ventricular ejection fraction, by estimation, is 40 to 45%. The  left ventricle has mildly decreased function. Left ventricular endocardial  border not optimally defined to evaluate regional wall motion. There is  mild left ventricular hypertrophy.   Left ventricular diastolic parameters are consistent with Grade I  diastolic dysfunction (impaired relaxation).   2. Right ventricular systolic function is normal. The right ventricular  size is normal. There is normal pulmonary artery systolic pressure. The  estimated right ventricular systolic pressure is 25.8 mmHg.   3. Left atrial size was mildly dilated.   4. The mitral valve is normal in structure. No evidence of mitral valve  regurgitation.  No evidence of mitral stenosis.   5. The aortic valve is normal in structure. Aortic valve regurgitation is  mild. Mild aortic valve sclerosis is present, with no evidence of aortic  valve stenosis. __________   2D echo 08/01/2018: 1. The left ventricle has mild-moderately reduced systolic function, with  an ejection fraction of 40 to 45%. The cavity size was mildly dilated.  Septal wall hypokinesis consistent with post-operative state. Unable to  exclude additional regional wall  motion abnormality. Left ventricular diastolic Doppler parameters are  consistent with pseudonormalization.   2. The  right ventricle has normal systolic function. The cavity was  normal. There is no increase in right ventricular wall thickness. Normal  RVSP   3. Small pericardial effusion. __________   See CV Studies for more remote cardiac imaging   EKG:  EKG is ordered today.  The EKG ordered today demonstrates NSR, 60 bpm, nonspecific inferolateral ST-T changes more pronounced from prior tracing  Recent Labs: 11/17/2023: ALT 24; BUN 8; Creatinine, Ser 0.77; Hemoglobin 13.5; Platelets 209; Potassium 4.6; Sodium 142  Recent Lipid Panel    Component Value Date/Time   CHOL 149 11/17/2023 1048   TRIG 249 (H) 11/17/2023 1048   HDL 45 11/17/2023 1048   CHOLHDL 3.3 11/17/2023 1048   VLDL 50 (H) 11/17/2023 1048   LDLCALC 54 11/17/2023 1048    PHYSICAL EXAM:    VS:  BP (!) 168/91 (BP Location: Left Arm, Patient Position: Sitting, Cuff Size: Normal)   Pulse 63   Resp 17   Ht 5' 8 (1.727 m)   Wt 204 lb 6.4 oz (92.7 kg)   SpO2 100%   BMI 31.08 kg/m   BMI: Body mass index is 31.08 kg/m.  Physical Exam Vitals reviewed.  Constitutional:      Appearance: He is well-developed.  HENT:     Head: Normocephalic and atraumatic.  Eyes:     General:        Right eye: No discharge.        Left eye: No discharge.  Cardiovascular:     Rate and Rhythm: Normal rate and regular rhythm.     Heart sounds: S1 normal and S2 normal. Heart sounds not distant. No midsystolic click and no opening snap. Murmur heard.     Systolic murmur is present with a grade of 1/6 at the upper left sternal border.     No friction rub.  Pulmonary:     Effort: Pulmonary effort is normal. No respiratory distress.     Breath sounds: Normal breath sounds. No decreased breath sounds, wheezing, rhonchi or rales.  Chest:     Chest wall: No tenderness.  Musculoskeletal:     Cervical back: Normal range of motion.     Right lower leg: Edema present.     Left lower leg: Edema present.     Comments: 1+ bilateral lower extremity  pitting edema to the knees.   Skin:    General: Skin is warm and dry.     Nails: There is no clubbing.  Neurological:     Mental Status: He is alert and oriented to person, place, and time.  Psychiatric:        Speech: Speech normal.        Behavior: Behavior normal.        Thought Content: Thought content normal.        Judgment: Judgment normal.     Wt Readings from Last 3 Encounters:  11/17/23 204 lb 6.4 oz (  92.7 kg)  10/28/23 200 lb 6.4 oz (90.9 kg)  10/14/23 200 lb 6.4 oz (90.9 kg)     ASSESSMENT & PLAN:   CAD status post CABG with redo CABG status post PCI/DES with accelerating angina: Currently without angina.  Over the past 2 to 3 months he notes exertional dyspnea, chest discomfort, and exertional nausea with vomiting.  In this setting, we will pursue diagnostic LHC given symptoms and extensive cardiac history.  Continue lifelong DAPT with aspirin  and clopidogrel  as long as tolerated.  Resume Imdur  60 mg daily.  Continue Crestor  40 mg.  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  Resume prior dose of torsemide  40 mg daily and spironolactone  25 mg daily.  Continue carvedilol  3.125 mg twice daily.  He has been off Jardiance  for approximately 4 to 5 months.  With recurrent pyelonephritis, would not rechallenge with SGLT2 inhibitor at this time.  Unable to tolerate ARB with intermittent hypotension.  Unable to prescribe ARNI due to allergy and intermittent hypotension.  Obtain echo.  HTN: Blood pressure is elevated in the office today.  We are resuming Imdur  with continuation of carvedilol  3.125 mg twice daily.  Will need close monitoring of blood pressure with history of intermittent hypotension.  HLD: LDL 61 in 2021.  Check lipid panel and LFT.  Remains on rosuvastatin  40 mg.  Carotid artery disease: Carotid artery ultrasound showed less than 40% stenosis bilaterally.   Aspirin  and statin as above.   Informed Consent   Shared Decision Making/Informed Consent{  The risks [stroke (1 in 1000), death (1 in 1000), kidney failure [usually temporary] (1 in 500), bleeding (1 in 200), allergic reaction [possibly serious] (1 in 200)], benefits (diagnostic support and management of coronary artery disease) and alternatives of a cardiac catheterization were discussed in detail with Mr. Eid and he is willing to proceed.        Disposition: F/u with Dr. Darron or an APP in 1-2 weeks.   Medication Adjustments/Labs and Tests Ordered: Current medicines are reviewed at length with the patient today.  Concerns regarding medicines are outlined above. Medication changes, Labs and Tests ordered today are summarized above and listed in the Patient Instructions accessible in Encounters.   Signed, Bernardino Bring, PA-C 11/17/2023 12:12 PM     [facility] [place] [address] [place], [place] [address] [phone]

## 2023-11-17 ENCOUNTER — Ambulatory Visit: Attending: Physician Assistant | Admitting: Physician Assistant

## 2023-11-17 ENCOUNTER — Ambulatory Visit: Payer: Self-pay | Admitting: Physician Assistant

## 2023-11-17 ENCOUNTER — Encounter: Payer: Self-pay | Admitting: Physician Assistant

## 2023-11-17 ENCOUNTER — Other Ambulatory Visit
Admission: RE | Admit: 2023-11-17 | Discharge: 2023-11-17 | Disposition: A | Discharge status: Home / Self Care | Source: Ambulatory Visit | Attending: Physician Assistant | Admitting: Physician Assistant

## 2023-11-17 VITALS — BP 168/91 | HR 63 | Resp 17 | Ht 68.0 in | Wt 204.4 lb

## 2023-11-17 DIAGNOSIS — Z79899 Other long term (current) drug therapy: Secondary | ICD-10-CM

## 2023-11-17 DIAGNOSIS — I10 Essential (primary) hypertension: Secondary | ICD-10-CM

## 2023-11-17 DIAGNOSIS — E785 Hyperlipidemia, unspecified: Secondary | ICD-10-CM

## 2023-11-17 DIAGNOSIS — I200 Unstable angina: Secondary | ICD-10-CM

## 2023-11-17 DIAGNOSIS — I255 Ischemic cardiomyopathy: Secondary | ICD-10-CM | POA: Diagnosis not present

## 2023-11-17 DIAGNOSIS — I5022 Chronic systolic (congestive) heart failure: Secondary | ICD-10-CM

## 2023-11-17 DIAGNOSIS — I25118 Atherosclerotic heart disease of native coronary artery with other forms of angina pectoris: Secondary | ICD-10-CM

## 2023-11-17 DIAGNOSIS — I779 Disorder of arteries and arterioles, unspecified: Secondary | ICD-10-CM

## 2023-11-17 LAB — COMPREHENSIVE METABOLIC PANEL WITH GFR
ALT: 24 U/L (ref 0–44)
AST: 47 U/L — ABNORMAL HIGH (ref 15–41)
Albumin: 4 g/dL (ref 3.5–5.0)
Alkaline Phosphatase: 53 U/L (ref 38–126)
Anion gap: 11 (ref 5–15)
BUN: 8 mg/dL (ref 8–23)
CO2: 28 mmol/L (ref 22–32)
Calcium: 9.6 mg/dL (ref 8.9–10.3)
Chloride: 103 mmol/L (ref 98–111)
Creatinine, Ser: 0.77 mg/dL (ref 0.61–1.24)
GFR, Estimated: >60 mL/min (ref >60)
Glucose, Bld: 108 mg/dL — ABNORMAL HIGH (ref 70–99)
Potassium: 4.6 mmol/L (ref 3.5–5.1)
Sodium: 142 mmol/L (ref 135–145)
Total Bilirubin: 0.6 mg/dL (ref 0.0–1.2)
Total Protein: 6.4 g/dL — ABNORMAL LOW (ref 6.5–8.1)

## 2023-11-17 LAB — LIPID PANEL
Cholesterol: 149 mg/dL (ref 0–200)
HDL: 45 mg/dL (ref >40)
LDL Cholesterol: 54 mg/dL (ref 0–99)
Total CHOL/HDL Ratio: 3.3 ratio
Triglycerides: 249 mg/dL — ABNORMAL HIGH (ref <150)
VLDL: 50 mg/dL — ABNORMAL HIGH (ref 0–40)

## 2023-11-17 LAB — CBC
HCT: 41.7 % (ref 39.0–52.0)
Hemoglobin: 13.5 g/dL (ref 13.0–17.0)
MCH: 32.8 pg (ref 26.0–34.0)
MCHC: 32.4 g/dL (ref 30.0–36.0)
MCV: 101.5 fL — ABNORMAL HIGH (ref 80.0–100.0)
Platelets: 209 K/uL (ref 150–400)
RBC: 4.11 MIL/uL — ABNORMAL LOW (ref 4.22–5.81)
RDW: 13.7 % (ref 11.5–15.5)
WBC: 9.2 K/uL (ref 4.0–10.5)
nRBC: 0 % (ref 0.0–0.2)

## 2023-11-17 MED ORDER — TORSEMIDE 20 MG PO TABS: 40.0000 mg | ORAL_TABLET | Freq: Every day | ORAL | 3 refills | Status: DC

## 2023-11-17 MED ORDER — ISOSORBIDE MONONITRATE ER 60 MG PO TB24: 60.0000 mg | ORAL_TABLET | Freq: Every day | ORAL | 3 refills | Status: DC

## 2023-11-17 MED ORDER — SPIRONOLACTONE 25 MG PO TABS: 25.0000 mg | ORAL_TABLET | Freq: Every day | ORAL | 3 refills | Status: DC

## 2023-11-17 NOTE — Patient Instructions (Signed)
 Medication Instructions:  Your physician recommends the following medication changes.  START TAKING: Imdur  60 mg daily Torsemide  40 mg daily Spironolactone  25 mg daily  *If you need a refill on your cardiac medications before your next appointment, please call your pharmacy*  Lab Work: Your provider would like for you to have following labs drawn today CBC, CMP and Lipid panel.   If you have labs (blood work) drawn today and your tests are completely normal, you will receive your results only by: MyChart Message (if you have MyChart) OR A paper copy in the mail If you have any lab test that is abnormal or we need to change your treatment, we will call you to review the results.  Testing/Procedures: Your physician has requested that you have an echocardiogram. Echocardiography is a painless test that uses sound waves to create images of your heart. It provides your doctor with information about the size and shape of your heart and how well your heart's chambers and valves are working.   You may receive an ultrasound enhancing agent through an IV if needed to better visualize your heart during the echo. This procedure takes approximately one hour.  There are no restrictions for this procedure.  This will take place at [address] [facility] [address]) [address], [place] [address]  Please note: We ask at that you not bring children with you during ultrasound (echo/ vascular) testing. Due to room size and safety concerns, children are not allowed in the ultrasound rooms during exams. Our front office staff cannot provide observation of children in our lobby area while testing is being conducted. An adult accompanying a patient to their appointment will only be allowed in the ultrasound room at the discretion of the ultrasound technician under special circumstances. We apologize for any inconvenience.   [facility] A DEPT OF [facility].  [facility]  AT [facility] [address] [place] [address] Dept: [phone] Loc: [phone]  Willie Clark  11/17/2023  You are scheduled for a Cardiac Catheterization on Tuesday, July 29 with Dr. Deatrice Cage.  1. Please arrive at the [facility] Center Entrance of [facility], [address] [place], [place] [address] at 10:30 AM (This is 1 hour(s) prior to your procedure time).  Proceed to the Check-In Desk directly inside the entrance.  Procedure Parking: Use the entrance off of the [address] side of the hospital. Turn right upon entering and follow the driveway to parking that is directly in front of the Heart & Vascular Center. There is no valet parking available at this entrance, however there is an awning directly in front of the Heart & Vascular Center for drop off/ pick up for patients.  Special note: Every effort is made to have your procedure done on time. Please understand that emergencies sometimes delay scheduled procedures.  2. Diet: Do not eat solid foods after midnight.  The patient may have clear liquids until 5am upon the day of the procedure.  3. Labs: You will need to have blood drawn today in the [facility]  4. Medication instructions in preparation for your procedure:   Contrast Allergy: No  Stop taking, Torsemide  (Demadex ) Monday, July 28,   On the morning of your procedure, take your Aspirin  81 mg and Plavix /Clopidogrel  and any morning medicines NOT listed above.  You may use sips of water .  5. Plan to go home the same day, you will only stay overnight if  medically necessary. 6. Bring a current list of your medications and current insurance cards. 7. You MUST have a responsible person to drive you home. 8. Someone MUST be with you the first 24 hours after you arrive home or your discharge will be delayed. 9. Please wear clothes that are easy to get on and off and wear slip-on shoes.  Thank you for allowing us  to care for you!    -- [facility]   Follow-Up: At [facility], you and your health needs are our priority.  As part of our continuing mission to provide you with exceptional heart care, our providers are all part of one team.  This team includes your primary Cardiologist (physician) and Advanced Practice Providers or APPs (Physician Assistants and Nurse Practitioners) who all work together to provide you with the care you need, when you need it.  Your next appointment:   1-2 week(s)  Provider:   You may see Deatrice Cage, MD or Bernardino Bring, PA-C

## 2023-11-18 ENCOUNTER — Ambulatory Visit
Admission: RE | Admit: 2023-11-18 | Discharge: 2023-11-18 | Disposition: A | Discharge status: Home / Self Care | Attending: Cardiovascular Disease | Admitting: Cardiovascular Disease

## 2023-11-18 ENCOUNTER — Other Ambulatory Visit: Payer: Self-pay

## 2023-11-18 ENCOUNTER — Encounter: Payer: Self-pay | Admitting: Cardiovascular Disease

## 2023-11-18 ENCOUNTER — Encounter
Admission: RE | Disposition: A | Discharge status: Home / Self Care | Payer: Self-pay | Source: Home / Self Care | Attending: Cardiovascular Disease

## 2023-11-18 DIAGNOSIS — I200 Unstable angina: Secondary | ICD-10-CM | POA: Diagnosis present

## 2023-11-18 DIAGNOSIS — Z7982 Long term (current) use of aspirin: Secondary | ICD-10-CM | POA: Diagnosis not present

## 2023-11-18 DIAGNOSIS — Z955 Presence of coronary angioplasty implant and graft: Secondary | ICD-10-CM | POA: Insufficient documentation

## 2023-11-18 DIAGNOSIS — Z79899 Other long term (current) drug therapy: Secondary | ICD-10-CM | POA: Diagnosis not present

## 2023-11-18 DIAGNOSIS — M797 Fibromyalgia: Secondary | ICD-10-CM | POA: Diagnosis not present

## 2023-11-18 DIAGNOSIS — E785 Hyperlipidemia, unspecified: Secondary | ICD-10-CM | POA: Diagnosis not present

## 2023-11-18 DIAGNOSIS — I6523 Occlusion and stenosis of bilateral carotid arteries: Secondary | ICD-10-CM | POA: Diagnosis not present

## 2023-11-18 DIAGNOSIS — I110 Hypertensive heart disease with heart failure: Secondary | ICD-10-CM | POA: Diagnosis not present

## 2023-11-18 DIAGNOSIS — I2582 Chronic total occlusion of coronary artery: Secondary | ICD-10-CM | POA: Diagnosis not present

## 2023-11-18 DIAGNOSIS — I25708 Atherosclerosis of coronary artery bypass graft(s), unspecified, with other forms of angina pectoris: Secondary | ICD-10-CM | POA: Diagnosis not present

## 2023-11-18 DIAGNOSIS — I255 Ischemic cardiomyopathy: Secondary | ICD-10-CM | POA: Insufficient documentation

## 2023-11-18 DIAGNOSIS — Z87891 Personal history of nicotine dependence: Secondary | ICD-10-CM | POA: Diagnosis not present

## 2023-11-18 DIAGNOSIS — R079 Chest pain, unspecified: Secondary | ICD-10-CM | POA: Diagnosis present

## 2023-11-18 DIAGNOSIS — Z7902 Long term (current) use of antithrombotics/antiplatelets: Secondary | ICD-10-CM | POA: Insufficient documentation

## 2023-11-18 DIAGNOSIS — I5022 Chronic systolic (congestive) heart failure: Secondary | ICD-10-CM | POA: Diagnosis not present

## 2023-11-18 DIAGNOSIS — I25118 Atherosclerotic heart disease of native coronary artery with other forms of angina pectoris: Secondary | ICD-10-CM | POA: Diagnosis not present

## 2023-11-18 DIAGNOSIS — G894 Chronic pain syndrome: Secondary | ICD-10-CM | POA: Insufficient documentation

## 2023-11-18 HISTORY — PX: LEFT HEART CATH AND CORS/GRAFTS ANGIOGRAPHY: CATH118250

## 2023-11-18 SURGERY — LEFT HEART CATH AND CORS/GRAFTS ANGIOGRAPHY
Anesthesia: Moderate Sedation

## 2023-11-18 MED ORDER — HEPARIN (PORCINE) IN NACL 1000-0.9 UT/500ML-% IV SOLN
INTRAVENOUS | Status: DC | PRN
  Administered 2023-11-18: 500 mL

## 2023-11-18 MED ORDER — ACETAMINOPHEN 325 MG PO TABS
650.0000 mg | ORAL_TABLET | ORAL | Status: DC | PRN
  Administered 2023-11-18: 650 mg via ORAL
  Filled 2023-11-18: qty 2

## 2023-11-18 MED ORDER — VERAPAMIL HCL 2.5 MG/ML IV SOLN
INTRAVENOUS | Status: DC | PRN
  Administered 2023-11-18: 2.5 mg via INTRA_ARTERIAL

## 2023-11-18 MED ORDER — SODIUM CHLORIDE 0.9 % IV SOLN: INTRAVENOUS | Status: DC

## 2023-11-18 MED ORDER — HEPARIN SODIUM (PORCINE) 1000 UNIT/ML IJ SOLN
INTRAMUSCULAR | Status: DC | PRN
  Administered 2023-11-18: 5000 [IU] via INTRAVENOUS

## 2023-11-18 MED ORDER — SODIUM CHLORIDE 0.9% FLUSH: 3.0000 mL | Freq: Two times a day (BID) | INTRAVENOUS | Status: DC

## 2023-11-18 MED ORDER — FREE WATER: 500.0000 mL | Freq: Once | Status: DC

## 2023-11-18 MED ORDER — MIDAZOLAM HCL 2 MG/2ML IJ SOLN
INTRAMUSCULAR | Status: AC
  Filled 2023-11-18: qty 2

## 2023-11-18 MED ORDER — HEPARIN SODIUM (PORCINE) 1000 UNIT/ML IJ SOLN
INTRAMUSCULAR | Status: AC
  Filled 2023-11-18: qty 10

## 2023-11-18 MED ORDER — SODIUM CHLORIDE 0.9% FLUSH: 3.0000 mL | INTRAVENOUS | Status: DC | PRN

## 2023-11-18 MED ORDER — LIDOCAINE HCL 1 % IJ SOLN
INTRAMUSCULAR | Status: AC
  Filled 2023-11-18: qty 20

## 2023-11-18 MED ORDER — IOHEXOL 300 MG/ML  SOLN
INTRAMUSCULAR | Status: DC | PRN
  Administered 2023-11-18: 90 mL

## 2023-11-18 MED ORDER — LIDOCAINE HCL (PF) 1 % IJ SOLN
INTRAMUSCULAR | Status: DC | PRN
  Administered 2023-11-18: 2 mL

## 2023-11-18 MED ORDER — ONDANSETRON HCL 4 MG/2ML IJ SOLN: 4.0000 mg | Freq: Four times a day (QID) | INTRAMUSCULAR | Status: DC | PRN

## 2023-11-18 MED ORDER — HEPARIN (PORCINE) IN NACL 1000-0.9 UT/500ML-% IV SOLN
INTRAVENOUS | Status: AC
  Filled 2023-11-18: qty 1000

## 2023-11-18 MED ORDER — ASPIRIN 81 MG PO CHEW: 81.0000 mg | CHEWABLE_TABLET | ORAL | Status: DC

## 2023-11-18 MED ORDER — MIDAZOLAM HCL 2 MG/2ML IJ SOLN
INTRAMUSCULAR | Status: DC | PRN
  Administered 2023-11-18: 1 mg via INTRAVENOUS

## 2023-11-18 MED ORDER — SODIUM CHLORIDE 0.9 % IV SOLN: 250.0000 mL | INTRAVENOUS | Status: DC | PRN

## 2023-11-18 MED ORDER — FENTANYL CITRATE (PF) 100 MCG/2ML IJ SOLN
INTRAMUSCULAR | Status: AC
Start: 2023-11-18 — End: 2023-11-18
  Filled 2023-11-18: qty 2

## 2023-11-18 MED ORDER — FENTANYL CITRATE (PF) 100 MCG/2ML IJ SOLN
INTRAMUSCULAR | Status: DC | PRN
  Administered 2023-11-18: 50 ug via INTRAVENOUS

## 2023-11-18 MED ORDER — VERAPAMIL HCL 2.5 MG/ML IV SOLN
INTRAVENOUS | Status: AC
  Filled 2023-11-18: qty 2

## 2023-11-18 SURGICAL SUPPLY — 10 items
CATH INFINITI 5FR JL4 (CATHETERS) IMPLANT
CATH INFINITI AMBI 5FR TG (CATHETERS) IMPLANT
CATH INFINITI JR4 5F (CATHETERS) IMPLANT
DEVICE RAD TR BAND REGULAR (VASCULAR PRODUCTS) IMPLANT
DRAPE BRACHIAL (DRAPES) IMPLANT
GLIDESHEATH SLEND SS 6F .021 (SHEATH) IMPLANT
GUIDEWIRE INQWIRE 1.5J.035X260 (WIRE) IMPLANT
PACK CARDIAC CATH (CUSTOM PROCEDURE TRAY) ×1 IMPLANT
SET ATX-X65L (MISCELLANEOUS) IMPLANT
STATION PROTECTION PRESSURIZED (MISCELLANEOUS) IMPLANT

## 2023-11-18 NOTE — Interval H&P Note (Signed)
 History and Physical Interval Note:  11/18/2023 12:17 PM  Willie Clark  has presented today for surgery, with the diagnosis of L Cath    Chest pain.  The various methods of treatment have been discussed with the patient and family. After consideration of risks, benefits and other options for treatment, the patient has consented to  Procedure(s): LEFT HEART CATH AND CORS/GRAFTS ANGIOGRAPHY (N/A) as a surgical intervention.  The patient's history has been reviewed, patient examined, no change in status, stable for surgery.  I have reviewed the patient's chart and labs.  Questions were answered to the patient's satisfaction.     Alysha Doolan

## 2023-11-19 ENCOUNTER — Encounter: Payer: Self-pay | Admitting: Cardiovascular Disease

## 2023-12-03 ENCOUNTER — Ambulatory Visit: Admitting: Physician Assistant

## 2023-12-08 ENCOUNTER — Ambulatory Visit: Admitting: Physician Assistant

## 2023-12-08 VITALS — BP 144/81 | HR 78 | Ht 68.0 in | Wt 192.4 lb

## 2023-12-08 DIAGNOSIS — R3912 Poor urinary stream: Secondary | ICD-10-CM | POA: Diagnosis not present

## 2023-12-08 DIAGNOSIS — N390 Urinary tract infection, site not specified: Secondary | ICD-10-CM

## 2023-12-08 DIAGNOSIS — N401 Enlarged prostate with lower urinary tract symptoms: Secondary | ICD-10-CM

## 2023-12-08 LAB — BLADDER SCAN AMB NON-IMAGING: Scan Result: 2 mL

## 2023-12-08 NOTE — Progress Notes (Unsigned)
 Cardiology Office Note    Date:  12/11/2023   ID:  THELMA VIANA, DOB [birth year], MRN [id number]  PCP:  [facility]  Cardiologist:  Deatrice Cage, MD  Electrophysiologist:  None   Chief Complaint: Follow-up  History of Present Illness:   Willie Clark is a 73 y.o. 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  Regarding his CAD, he underwent CABG in 1993 with redo CABG in 2007.  He was admitted in 11/2019 with chest pain.  Echo showed an EF of 40 to 45%.  LHC showed severe underlying three-vessel coronary arteries with patent grafts and no significant change since 2017.  His antihypertensive medications were switched to losartan  and carvedilol .  Rosuvastatin  was increased to 40 mg daily.  Losartan  was subsequently switched to Entresto .  However, the patient had allergic reaction with rash and itching that had to be treated in the emergency room.  Spironolactone  was subsequently added.  He was seen in 06/2020 with symptoms of unstable angina.  His EKG was worrisome for inferior ST elevation myocardial infarction and thus he was transferred emergently to the cath lab where he underwent cardiac cath which showed severe native three-vessel coronary artery disease with patent LCx stent with mild to moderate in-stent restenosis, widely patent LIMA to LAD, patent SVG to OM2 with 60 to 70% proximal graft stenosis and subtotally occluded SVG to RCA with long segment of proximal graft disease and 99% stenosis at the distal anastomosis.  He underwent successful PCI to SVG to RCA with overlapping resolute Onyx drug-eluting stents.  Echo during that admission showed an EF of 35 to 40%.  He was  admitted in 10/2020 with increased shortness of breath and chest pain LHC showed showed significant underlying three-vessel coronary artery disease with patent left circumflex stent with moderate in-stent restenosis, patent LIMA to LAD, patent SVG to right PDA with patent stents and patent SVG to OM with sequential 75% and 60% ostial and mid graft stenoses. Right heart catheterization showed mildly elevated filling pressures with mildly reduced cardiac output. PCI with 2 nonoverlapping drug-eluting stent placement was done to SVG to OM.  Echo in 05/2021 showed an EF of 45 to 50%, global hypokinesis, mildly dilated LV internal cavity size, mild LVH of the lateral segment, grade 1 diastolic dysfunction, normal RV systolic function with moderately enlarged ventricular cavity size, mild mitral regurgitation, and trivial aortic insufficiency.  Lexiscan  MPI in 06/2021 showed a small region of ischemia in the inferolateral wall with an EF of 53% and was overall low to moderate risk.  He has continued to report chronic chest pain, shortness of breath, and fatigue at follow-up visits.  Relative hypotension has also previously led to the de-escalation of pharmacotherapy.   He was admitted to the hospital in 09/2023 with severe sepsis secondary to acute pyelonephritis complicated by recurrent nephrolithiasis and Enterobacterales bacteremia.  At time of discharge his Imdur , spironolactone , torsemide , and KCl were all held and have not yet been resumed.  He was last seen in the office on 11/17/2023 reporting a progression of exertional chest pain and shortness of breath over the preceding 2 to 3 months as well as an increase in lower extremity swelling occurring while  off torsemide  and spironolactone .  He also reported having been off of Jardiance  for approximately 4 to 5 months due to financial constraints.  It was recommended he resume torsemide  40 mg daily and spironolactone  25 mg daily with continuation of carvedilol  3.125 mg  twice daily.  He was not rechallenged with SGLT2 inhibitor due to recurrent pyelonephritis.  Given exertional angina and dyspnea, he underwent LHC on 11/18/2023 that showed severe underlying three-vessel CAD with interval occlusion of SVG to OM when compared to cath from 2022.  Patent LIMA to LAD and patent SVG to RPDA with patent stents.  The OM1 received collaterals from the RCA.  Moderately reduced LV systolic function with mildly elevated LVEDP.  The occluded SVG to OM1 was felt to be the likely culprit for increased angina.  However, there were no revascularization options for this with medical management recommended.  He comes in today and is currently without symptoms of angina or cardiac decompensation.  He continues to note longstanding unchanged chronic chest discomfort and shortness of breath.  He also reports mowing the lawn with a riding mower and using the weedeater on 8/20 which was followed by significant fatigue.  Now back on spironolactone  25 mg daily and torsemide  40 mg daily.  His weight is down 9 pounds today when compared to his visit last month.  Lower extremity swelling improved.  No progressive orthopnea.  No falls or symptoms concerning for bleeding.   Labs independently reviewed: 10/2023 - Hgb 13.5, PLT 209, potassium 4.6, BUN 8, serum creatinine 0.77, albumin 4.0, AST 47, ALT normal, TC 149, TG 249, HDL 45, LDL 54 02/2023 - TSH normal, A1c 6.8  Past Medical History:  Diagnosis Date   AAA (abdominal aortic aneurysm) (HCC) 02/23/2020   a.) AAA duplex 02/23/2020: measured 3.0 cm   Acute coronary syndrome (HCC)    Anxiety    Aortic atherosclerosis (HCC)    Arthritis    ASD (atrial septal defect) 01/20/2006   a.) noted on intraoperative TEE; s/p closure   Barrett esophagus    Barrett's esophagus    Bilateral leg edema    Bilateral occipital neuralgia    Bowel obstruction (HCC)    Carpal tunnel syndrome    CHF (congestive heart failure) (HCC)    Chronic chest pain     COPD (chronic obstructive pulmonary disease) (HCC)    Coronary artery disease    a.)CABG 93; Redo 07; b.)LHC 03/15: 100 mLAD, 60 pLCx, 100 OM1, 80 pRCA, 100 mRCA - med mgmt. c.)LHC 6/17: LAD 100ost,  LCx 60p, OM1 100, 100 oRCA, VG-OM2 min irreg, VG-RPDA 30ost, 20d - med mgmt; d.)LHC 8/21: 100 oLAD, 100 oOM1-OM1, 100 o-pRCA, 100 RPL2, 100 RPL3, 100 OM2; pat grafts - med mgmt; e.)LHC 3/22: 60-70 o-pVG-OM2, 80-99 VG-RCA (DES x3); f.)R/LHC 07/22: 75 (ost) & 60 (mid) VG-OM (DES x2)   Depression    Diverticulitis    Dyshidrosis    Dysphagia    Enlarged prostate without lower urinary tract symptoms (luts)    Erectile dysfunction    Fibromyalgia    Gastric ulcer    Gastritis    GERD (gastroesophageal reflux disease)    Headache    Hemorrhoids    HFrEF (heart failure with reduced ejection fraction) (HCC)    a.) 09/2016 Echo: EF 55-60%, no rwma, triv AI, mild MR, mildly dil LA. -> as of April 2020: EF 40 to 45%.  Septal dyssynergy/hypokinesis due to postop state but otherwise unable to assess wall motion Sharol due to  poor study. b.) TTE 12/12/2019: EF 40-45%, LAE, mild AR, G1DD; c.) TTE 06/20/2020: EF 35-40%, sev inferolateral HK, mild AR, G1DD.   History of BPH    History of kidney stones    History of myocardial infarct at age less than 60 years    Hyperlipidemia    Hypertension    Hyponatremia    Idiopathic urticaria    Intermittent claudication (HCC)    Kidney stones    Left ventricular failure (HCC)    Lightheadedness    Liver disease    Long term current use of antithrombotics/antiplatelets    a.) daily DAPT therapy (ASA + clopidogrel )   Mass on back    Migraine without aura    Myocardial infarction (HCC) 1993   Nail dystrophy    Nephrolithiasis    Obstructive uropathy    Orthostatic hypotension    a. Improved after discontinuation of metoprolol .   OSA on CPAP    Peripheral neuropathy    Peripheral neuropathy    Peyronie disease    Postoperative atrial fibrillation/flutter  (HCC) 01/20/2006   a.) POD2 following re-do CABG; Tx'd with IV amiodarone, which did not convert; EF 20% on POD3; DCCV (100 J x 1) POD4.   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   a.) 2v CABG: LIMA-LAD, SVG-D2   S/P cervical spinal fusion    S/P RE-DO CABG x 2 01/20/2006   a.) 2v REDO CABG: reserse SVG-PDA, SVG-OM1   Sinus bradycardia    Skin lesions, generalized    Small bowel obstruction (HCC) 02/2009   ST elevation myocardial infarction (STEMI) of inferior wall (HCC) 06/2020   a.) LHC 06/2020 --> subtotally occluded SVG-RCA with long segment of proximal graft disease and 99% stenosis at the distal anastomosis --> successful PCI to SVG to RCA with overlapping Resolute Onyx DES x 3   Stroke [facility]) 1993   right side weakness no blood thinners   on Aspirin    Syncope and collapse    Synovial cyst of lumbar facet joint    TIA (transient ischemic attack) 08/13/2014   Urticaria     Past Surgical History:  Procedure Laterality Date   BACK SURGERY     BIOPSY  04/22/2023   Procedure: BIOPSY;  Surgeon: Maryruth Ole DASEN, MD;  Location: [facility] ENDOSCOPY;  Service: Endoscopy;;  Gastric and Colonic   bowel obstruction  01/2009   CARDIAC CATHETERIZATION  11/2006   patent grafts. Significant OM3 disease and occluded diagonals.   CARDIAC CATHETERIZATION  06/2010   patent grafts. significant ISR in proximal LCX but OM2 is bypassed and gives retrograde flow to OM3.    CARDIAC CATHETERIZATION  06/2013   [facility]: Patent grafts with 60% proximal in-stent restenosis in the left circumflex with FFR of 0.85   CARDIAC CATHETERIZATION Left 09/25/2015   Procedure: Left Heart Cath and Cors/Grafts Angiography;  Surgeon: Deatrice DELENA Cage, MD;  Location: [facility] INVASIVE CV LAB;  Service: Cardiovascular;  Laterality: Left;   CATARACT EXTRACTION     CERVICAL FUSION     CHOLECYSTECTOMY     COLON SURGERY  01/2009   BOWEL  RESECTION DUE TO SMALL BOWEL OBSTRUCTION   COLONOSCOPY     COLONOSCOPY WITH PROPOFOL  N/A 10/28/2017   Procedure: COLONOSCOPY WITH PROPOFOL ;  Surgeon: Gaylyn Gladis PENNER, MD;  Location: [facility] ENDOSCOPY;  Service: Endoscopy;  Laterality: N/A;   COLONOSCOPY WITH PROPOFOL  N/A 07/30/2021  Procedure: COLONOSCOPY WITH PROPOFOL ;  Surgeon: Maryruth Ole DASEN, MD;  Location: [facility] ENDOSCOPY;  Service: Endoscopy;  Laterality: N/A;   COLONOSCOPY WITH PROPOFOL  N/A 04/22/2023   Procedure: COLONOSCOPY WITH PROPOFOL ;  Surgeon: Maryruth Ole DASEN, MD;  Location: [facility] ENDOSCOPY;  Service: Endoscopy;  Laterality: N/A;   CORONARY ANGIOPLASTY  2006   CORONARY ARTERY BYPASS GRAFT  1993/01/2006   redo at [facility]. LIMA to LAD, SVG to OM2 and SVG to RPDA   CORONARY STENT INTERVENTION N/A 06/20/2020   Procedure: CORONARY STENT INTERVENTION;  Surgeon: Mady Bruckner, MD;  Location: [facility] INVASIVE CV LAB;  Service: Cardiovascular;  Laterality: N/A;   CORONARY STENT INTERVENTION N/A 11/15/2020   Procedure: CORONARY STENT INTERVENTION;  Surgeon: Mady Bruckner, MD;  Location: [facility] INVASIVE CV LAB;  Service: Cardiovascular;  Laterality: N/A;   CYSTOSCOPY WITH STENT PLACEMENT Bilateral 09/24/2015   Procedure: CYSTOSCOPY WITH BILATERAL RETROGRADES, BILATERAL STENT PLACEMENT;  Surgeon: Donnice Brooks, MD;  Location: [facility] ORS;  Service: Urology;  Laterality: Bilateral;   ESOPHAGOGASTRODUODENOSCOPY N/A 10/25/2014   Procedure: ESOPHAGOGASTRODUODENOSCOPY (EGD);  Surgeon: Gladis RAYMOND Mariner, MD;  Location: [facility] ENDOSCOPY;  Service: Endoscopy;  Laterality: N/A;   ESOPHAGOGASTRODUODENOSCOPY (EGD) WITH PROPOFOL  N/A 02/03/2017   Procedure: ESOPHAGOGASTRODUODENOSCOPY (EGD) WITH PROPOFOL ;  Surgeon: Mariner Gladis RAYMOND, MD;  Location: [facility] ENDOSCOPY;  Service: Endoscopy;  Laterality: N/A;   ESOPHAGOGASTRODUODENOSCOPY (EGD) WITH PROPOFOL  N/A 04/18/2017   Procedure: ESOPHAGOGASTRODUODENOSCOPY (EGD) WITH PROPOFOL ;  Surgeon: Mariner Gladis RAYMOND, MD;  Location: [facility] ENDOSCOPY;  Service: Endoscopy;  Laterality: N/A;   ESOPHAGOGASTRODUODENOSCOPY (EGD) WITH PROPOFOL  N/A 04/22/2023   Procedure: ESOPHAGOGASTRODUODENOSCOPY (EGD) WITH PROPOFOL ;  Surgeon: Maryruth Ole DASEN, MD;  Location: [facility] ENDOSCOPY;  Service: Endoscopy;  Laterality: N/A;   EYE SURGERY Bilateral    Cataract Extraction with IOL   HERNIA REPAIR     INGUINAL HERNIA REPAIR Bilateral 01/23/2016   Procedure: HERNIA REPAIR INGUINAL ADULT BILATERAL;  Surgeon: Larinda Unknown Sharps, MD;  Location: [facility] ORS;  Service: General;  Laterality: Bilateral;   LEFT HEART CATH AND CORS/GRAFTS ANGIOGRAPHY N/A 12/13/2019   Procedure: LEFT HEART CATH AND CORS/GRAFTS ANGIOGRAPHY;  Surgeon: Darron Deatrice LABOR, MD;  Location: [facility] INVASIVE CV LAB;  Service: Cardiovascular;  Laterality: N/A;   LEFT HEART CATH AND CORS/GRAFTS ANGIOGRAPHY N/A 06/20/2020   Procedure: LEFT HEART CATH AND CORS/GRAFTS ANGIOGRAPHY;  Surgeon: Mady Bruckner, MD;  Location: [facility] INVASIVE CV LAB;  Service: Cardiovascular;  Laterality: N/A;   LEFT HEART CATH AND CORS/GRAFTS ANGIOGRAPHY N/A 11/18/2023   Procedure: LEFT HEART CATH AND CORS/GRAFTS ANGIOGRAPHY;  Surgeon: Darron Deatrice LABOR, MD;  Location: [facility] INVASIVE CV LAB;  Service: Cardiovascular;  Laterality: N/A;   LUMBAR LAMINECTOMY/DECOMPRESSION MICRODISCECTOMY Left 10/01/2016   Procedure: Left Lumbar four-five Laminotomy for resection of synovial cyst;  Surgeon: Unice Pac, MD;  Location: [facility] OR;  Service: Neurosurgery;  Laterality: Left;  left   NECK SURGERY  06/2009   POLYPECTOMY  04/22/2023   Procedure: POLYPECTOMY;  Surgeon: Maryruth Ole DASEN, MD;  Location: [facility] ENDOSCOPY;  Service: Endoscopy;;  Colon   RIGHT/LEFT HEART CATH AND CORONARY/GRAFT ANGIOGRAPHY N/A 11/15/2020   Procedure: RIGHT/LEFT HEART CATH AND CORONARY/GRAFT ANGIOGRAPHY;  Surgeon: Mady Bruckner, MD;  Location: [facility] INVASIVE CV LAB;  Service: Cardiovascular;  Laterality: N/A;   SHOULDER SURGERY  Bilateral 2010    Current Medications: Current Meds  Medication Sig   acetaminophen  (TYLENOL ) 500 MG tablet Take 1,000 mg by mouth every 6 (six) hours as needed for mild pain.   albuterol  (VENTOLIN  HFA) 108 (90 Base) MCG/ACT inhaler  Inhale 1 puff into the lungs as needed.   aspirin  EC 81 MG EC tablet Take 1 tablet (81 mg total) by mouth daily. Swallow whole.   benzonatate (TESSALON) 100 MG capsule Take 100 mg by mouth 3 (three) times daily as needed for cough.   carvedilol  (COREG ) 3.125 MG tablet Take 1 tablet (3.125 mg total) by mouth 2 (two) times daily with a meal.   citalopram  (CELEXA ) 20 MG tablet Take 20 mg by mouth daily.    clobetasol  cream (TEMOVATE ) 0.05 % APPLY TO ITCHY AREAS ON ELBOWS TWICE A DAY UNTIL IMPROVED. AVOID FACE, GROIN, UNDERARMS.   clopidogrel  (PLAVIX ) 75 MG tablet TAKE 1 TABLET EVERY DAY   EPINEPHrine  0.3 mg/0.3 mL IJ SOAJ injection Inject 0.3 mg into the muscle as needed.   fexofenadine (ALLEGRA) 180 MG tablet Take 180 mg by mouth daily.   gabapentin  (NEURONTIN ) 400 MG capsule Take 400-800 mg by mouth 3 (three) times daily.   isosorbide  mononitrate (IMDUR ) 60 MG 24 hr tablet Take 1 tablet (60 mg total) by mouth daily.   Multiple Vitamins-Minerals (HAIR SKIN AND NAILS FORMULA PO) Take 1 capsule by mouth daily.   nitroGLYCERIN  (NITROSTAT ) 0.4 MG SL tablet Place 1 tablet (0.4 mg total) under the tongue every 5 (five) minutes as needed for chest pain.   pantoprazole  (PROTONIX ) 40 MG tablet Take 40 mg by mouth 2 (two) times daily.    [Paused] potassium chloride  SA (KLOR-CON  M) 20 MEQ tablet Take 1 tablet (20 mEq total) by mouth daily.   Probiotic Product (PROBIOTIC PO) Take by mouth daily at 8 pm.   Psyllium (DAILY FIBER PO) Take 1 tablet by mouth daily.   rOPINIRole  (REQUIP ) 1 MG tablet Take 1 mg by mouth 2 (two) times daily.   rosuvastatin  (CRESTOR ) 40 MG tablet Take 1 tablet (40 mg total) by mouth daily.   spironolactone  (ALDACTONE ) 25 MG tablet Take 1 tablet (25 mg  total) by mouth daily.   torsemide  (DEMADEX ) 20 MG tablet Take 2 tablets (40 mg total) by mouth daily.   traMADol  (ULTRAM ) 50 MG tablet Take 1 tablet (50 mg total) by mouth every 6 (six) hours as needed for up to 15 doses for severe pain or moderate pain.   vitamin B-12 (CYANOCOBALAMIN ) 1000 MCG tablet Take 1,000 mcg by mouth daily.    Allergies:   Amitriptyline, Amoxicillin -pot clavulanate, Cyclobenzaprine, Dicyclomine , Sacubitril-valsartan, Icosapent  ethyl (epa ethyl ester) (fish), Amitriptyline hcl, Bacon flavoring agent (non-screening), Hydrocodone , Fish oil, Lidocaine , Omega-3 fatty acids-vitamin e, Other, Oxycodone -acetaminophen , and Sulfa antibiotics   Social History   Socioeconomic History   Marital status: Married    Spouse name: Willie Clark   Number of children: Not on file   Years of education: Not on file   Highest education level: Not on file  Occupational History   Not on file  Tobacco Use   Smoking status: Former    Current packs/day: 0.00    Types: Cigarettes    Quit date: 08/21/1991    Years since quitting: 32.3   Smokeless tobacco: Never  Vaping Use   Vaping status: Never Used  Substance and Sexual Activity   Alcohol  use: Not Currently   Drug use: No   Sexual activity: Not on file  Other Topics Concern   Not on file  Social History Narrative   Not on file   Social Drivers of Health   Financial Resource Strain: High Risk (07/17/2023)   Received [facility]   Overall Financial Resource Strain (  CARDIA)    Difficulty of Paying Living Expenses: Hard  Food Insecurity: Patient Unable To Answer (10/14/2023)   Hunger Vital Sign    Worried About Running Out of Food in the Last Year: Patient unable to answer    Ran Out of Food in the Last Year: Patient unable to answer  Transportation Needs: Patient Unable To Answer (10/14/2023)   PRAPARE - Transportation    Lack of Transportation (Medical): Patient unable to answer    Lack of Transportation  (Non-Medical): Patient unable to answer  Physical Activity: Not on file  Stress: Not on file  Social Connections: Patient Unable To Answer (10/14/2023)   Social Connection and Isolation Panel    Frequency of Communication with Friends and Family: Patient unable to answer    Frequency of Social Gatherings with Friends and Family: Patient unable to answer    Attends Religious Services: Patient unable to answer    Active Member of Clubs or Organizations: Patient unable to answer    Attends Banker Meetings: Patient unable to answer    Marital Status: Patient unable to answer     Family History:  The patient's family history includes Breast cancer in his paternal aunt; Heart attack in his father; Heart disease (age of onset: 25) in his father.  ROS:   12-point review of systems is negative unless otherwise noted in the HPI.   EKGs/Labs/Other Studies Reviewed:    Studies reviewed were summarized above. The additional studies were reviewed today:  LHC 11/18/2023:   Ost LAD lesion is 100% stenosed.   Prox Cx to Mid Cx lesion is 50% stenosed.   Ost RCA to Prox RCA lesion is 100% stenosed.   Origin to Prox Graft lesion is 10% stenosed.   Ost 1st Mrg to 1st Mrg lesion is 100% stenosed.   1st Mrg lesion is 40% stenosed.   2nd Mrg lesion is 100% stenosed.   2nd RPL lesion is 100% stenosed.   3rd RPL lesion is 100% stenosed.   Origin to Prox Graft lesion is 100% stenosed.   Non-stenotic Prox Graft lesion was previously treated.   Non-stenotic Mid Graft lesion was previously treated.   Non-stenotic Insertion lesion was previously treated.   SVG and is moderate in size.   LIMA and is normal in caliber.   SVG and is normal in caliber.   The graft exhibits no disease.   There is moderate left ventricular systolic dysfunction.   LV end diastolic pressure is mildly elevated.   The left ventricular ejection fraction is 35-45% by visual estimate.   1.  Severe underlying  three-vessel coronary artery disease with interval occlusion of SVG to OM since last year.  Patent LIMA to LAD and patent SVG to right PDA with patent stents.  The OM now gets collaterals from the right coronary artery. 2.  Moderately reduced LV systolic function with mildly elevated left ventricular end-diastolic pressure.   Recommendations: Occluded SVG to OM is the likely culprit for increased angina.  However, no revascularization options for this.  The OM now gets collaterals from the right coronary artery.  Recommend optimizing heart failure and anginal medications. __________  Lexiscan  MPI 07/19/2021: Pharmacological myocardial perfusion imaging study with a small region of ischemia in the inferolateral wall Small fixed defect distal anterior/apical wall Fixed basal to mid septal wall perfusion defect Normal wall motion, EF estimated at 53% GI uptake artifact noted No EKG changes concerning for ischemia at peak stress or in recovery. Low to  moderate risk scan __________   2D echo 06/03/2021: 1. Septal-lateral dyssynchrony. Left ventricular ejection fraction, by  estimation, is 45 to 50%. The left ventricle has mildly decreased  function. The left ventricle demonstrates global hypokinesis. The left  ventricular internal cavity size was mildly  dilated. There is mild left ventricular hypertrophy of the lateral  segment. Left ventricular diastolic parameters are consistent with Grade I  diastolic dysfunction (impaired relaxation).   2. Right ventricular systolic function is normal. The right ventricular  size is moderately enlarged.   3. The mitral valve is normal in structure. Mild mitral valve  regurgitation. No evidence of mitral stenosis.   4. The aortic valve is tricuspid. Aortic valve regurgitation is trivial.  No aortic stenosis is present.  __________   2D echo 11/15/2020: 1. Challenging images, definity  used   2. Left ventricular ejection fraction, by estimation, is 40 to  45 %. The  left ventricle has mildly decreased function. Mild global hypokinesis with  moderate anteroseptal wall hypokinesis possibly secondary to postoperative  state, and inferior wall  hypokinesis.   3. Right ventricular systolic function is normal. The right ventricular  size is normal. There is normal pulmonary artery systolic pressure. The  estimated right ventricular systolic pressure is 24.9 mmHg.   4. Left atrial size was mildly dilated.   5. The mitral valve is normal in structure. Mild mitral valve  regurgitation.  __________     [facility] 11/15/2020: Conclusions: Severe three-vessel coronary artery disease including chronic total occlusions of the ostial LAD, OM1, and proximal RCA. Patent LCx stent with moderate in-stent restenosis (~50%), similar to prior catheterizations. Widely patent LIMA to LAD. Patent SVG to RPDA with patent proximal/mid graft stents that demonstrate mild in-stent restenosis (~10%).  Distal anastomotic stent is widely patent.  There is significant ectasia in the midportion of the graft. Patent SVG to OM with sequential 75% and 60% ostial and mid graft stenoses. Upper normal to mildly elevated left and right heart filling pressures. Normal pulmonary artery pressure. Mildly reduced Fick cardiac output/index. Successful PCI to SVG to OM with nonoverlapping Resolute Onyx 3.0 x 12 mm (ostial) and 3.0 x 15 mm (mid graft) drug-eluting stents with 0% residual stenosis and TIMI-3 flow.   Recommendations: Overnight observation. Continue dual antiplatelet therapy with aspirin  and ticagrelor  for at least 12 months, ideally longer.  We will plan to CYP2C19 genotype to see if he is an appropriate clopidogrel  responder in case dyspnea persists and alternative therapies need to be considered. Aggressive secondary prevention. Obtain echocardiogram. Optimize goal-directed medical therapy for chronic HFrEF that appears relatively well compensated at this time based on RHC  findings. __________   2D echo 06/20/2020: 1. Left ventricular ejection fraction, by estimation, is 35 to 40%. The  left ventricle has moderately decreased function. The left ventricle  demonstrates regional wall motion abnormalities (see scoring  diagram/findings for description). Left ventricular   diastolic parameters are consistent with Grade I diastolic dysfunction  (impaired relaxation). There is severe hypokinesis of the left  ventricular, entire inferolateral wall.   2. Right ventricular systolic function is low normal. The right  ventricular size is normal.   3. The mitral valve is normal in structure. No evidence of mitral valve  regurgitation.   4. The aortic valve is tricuspid. Aortic valve regurgitation is mild.   5. The inferior vena cava is normal in size with greater than 50%  respiratory variability, suggesting right atrial pressure of 3 mmHg. __________   LHC 06/20/2020: Conclusions:  Severe native CAD, including chronic total occlusions of the ostial LAD and RCA. Patent LCx stent with mild to moderate in-stent restenosis. Widely patent LIMA-LAD. Patent SVG-OM2 with 60-70% ostial/proximal graft stenosis. Subtotally occluded SVG-RCA with long segment of proximal graft disease of up to 80% and 99% stenosis at the distal anastomosis with TIMI-1 flow. Moderately reduced left ventricular contraction with inferior akinesis.  Normal left ventricular filling pressure. Successful PCI to SVG-RCA with overlapping Resolute Onyx 4.0 x 26 mm and 4.0 x 38 mm proximal and 2.5 x 18 mm distal (extending into rPDA) drug-eluting stents.   Recommendations: Continue tirofiban  infusion x 6 hours. Dual antiplatelet therapy with aspirin  and tigagrelor for at least 12 months. Aggressive secondary prevention. If patient has recurrent chest pain, consider PCI to SVG-OM2. Follow-up echocardiogram. __________   LHC 12/13/2019: Ost LAD lesion is 100% stenosed. Ost 1st Mrg to 1st Mrg lesion is  100% stenosed. The left ventricular ejection fraction is 35-45% by visual estimate. There is moderate left ventricular systolic dysfunction. LV end diastolic pressure is mildly elevated. Ost RCA to Prox RCA lesion is 100% stenosed. SVG graft was visualized by angiography and is moderate in size. The graft exhibits mild . LIMA graft was visualized by angiography and is normal in caliber. The graft exhibits no disease. 2nd RPL lesion is 100% stenosed. 3rd RPL lesion is 100% stenosed. SVG graft was visualized by angiography and is normal in caliber. The graft exhibits mild . 2nd Mrg lesion is 100% stenosed. Prox Cx to Mid Cx lesion is 40% stenosed.   1.  Severe underlying three-vessel coronary artery disease with patent grafts including LIMA to LAD, SVG to OM and SVG to right PDA.  Patent stent in the native mid left circumflex with moderate in-stent restenosis.  No significant change in coronary anatomy since most recent cardiac catheterization in 2017.  The patient does have diffuse small vessel disease. 2.  Moderately reduced LV systolic function with an EF of 35 to 40% with mildly elevated left ventricular end-diastolic pressure at 15 mmHg.   Recommendations: Continue medical therapy for coronary artery disease and chronic systolic heart failure.  The patient is not able to tolerate long-acting nitroglycerin  due to headaches. For cardiomyopathy, I discontinued amlodipine  and indapamide  and will start him on small dose carvedilol  and losartan .  We could consider switching to Entresto  as an outpatient. __________   2D echo 12/12/2019: 1. Left ventricular ejection fraction, by estimation, is 40 to 45%. The  left ventricle has mildly decreased function. Left ventricular endocardial  border not optimally defined to evaluate regional wall motion. There is  mild left ventricular hypertrophy.   Left ventricular diastolic parameters are consistent with Grade I  diastolic dysfunction (impaired  relaxation).   2. Right ventricular systolic function is normal. The right ventricular  size is normal. There is normal pulmonary artery systolic pressure. The  estimated right ventricular systolic pressure is 25.8 mmHg.   3. Left atrial size was mildly dilated.   4. The mitral valve is normal in structure. No evidence of mitral valve  regurgitation. No evidence of mitral stenosis.   5. The aortic valve is normal in structure. Aortic valve regurgitation is  mild. Mild aortic valve sclerosis is present, with no evidence of aortic  valve stenosis. __________   2D echo 08/01/2018: 1. The left ventricle has mild-moderately reduced systolic function, with  an ejection fraction of 40 to 45%. The cavity size was mildly dilated.  Septal wall hypokinesis consistent with post-operative state. Unable  to  exclude additional regional wall  motion abnormality. Left ventricular diastolic Doppler parameters are  consistent with pseudonormalization.   2. The right ventricle has normal systolic function. The cavity was  normal. There is no increase in right ventricular wall thickness. Normal  RVSP   3. Small pericardial effusion. __________   See CV Studies for more remote cardiac imaging    EKG:  EKG is ordered today.  The EKG ordered today demonstrates NSR, 65 bpm, nonspecific ST-T changes  Recent Labs: 11/17/2023: ALT 24; BUN 8; Creatinine, Ser 0.77; Hemoglobin 13.5; Platelets 209; Potassium 4.6; Sodium 142  Recent Lipid Panel    Component Value Date/Time   CHOL 149 11/17/2023 1048   TRIG 249 (H) 11/17/2023 1048   HDL 45 11/17/2023 1048   CHOLHDL 3.3 11/17/2023 1048   VLDL 50 (H) 11/17/2023 1048   LDLCALC 54 11/17/2023 1048    PHYSICAL EXAM:    VS:  BP (!) 144/86 (BP Location: Left Arm, Patient Position: Sitting, Cuff Size: Normal)   Pulse 65   Ht 5' 8 (1.727 m)   Wt 195 lb (88.5 kg)   SpO2 96%   BMI 29.65 kg/m   BMI: Body mass index is 29.65 kg/m.  Physical Exam Vitals  reviewed.  Constitutional:      Appearance: He is well-developed.  HENT:     Head: Normocephalic and atraumatic.  Eyes:     General:        Right eye: No discharge.        Left eye: No discharge.  Cardiovascular:     Rate and Rhythm: Normal rate and regular rhythm.     Heart sounds: S1 normal and S2 normal. Heart sounds not distant. No midsystolic click and no opening snap. Murmur heard.     Systolic murmur is present with a grade of 1/6 at the upper left sternal border.     No friction rub.  Pulmonary:     Effort: Pulmonary effort is normal. No respiratory distress.     Breath sounds: Normal breath sounds. No decreased breath sounds, wheezing, rhonchi or rales.  Musculoskeletal:     Cervical back: Normal range of motion.     Right lower leg: No edema.     Left lower leg: No edema.  Skin:    General: Skin is warm and dry.     Nails: There is no clubbing.  Neurological:     Mental Status: He is alert and oriented to person, place, and time.  Psychiatric:        Speech: Speech normal.        Behavior: Behavior normal.        Thought Content: Thought content normal.        Judgment: Judgment normal.     Wt Readings from Last 3 Encounters:  12/11/23 195 lb (88.5 kg)  12/08/23 192 lb 6.4 oz (87.3 kg)  11/18/23 201 lb 6.4 oz (91.4 kg)     ASSESSMENT & PLAN:   CAD status post CABG with redo CABG status post PCI/DES with stable angina: Currently without symptoms of angina or cardiac decompensation.  Recent LHC in 10/2023 showed severe native vessel CAD with patent LIMA to LAD and patent SVG to RPDA with patent stents.  The SVG to OM had developed into full occlusion with the OM receiving collaterals from the RCA.  There were no revascularization options for the SVG to OM.  Continue aggressive risk factor modification and secondary prevention including aspirin  81 mg,  clopidogrel  75 mg, carvedilol  3.125 mg twice daily, and Imdur  60 mg daily.  Check BMP and CBC.  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  Currently on carvedilol  3.125 mg twice daily, spironolactone  25 mg daily, and torsemide  40 mg daily.  Not on Entresto  secondary to allergy.  Previously, losartan  has been discontinued in the setting of relative hypotension.  However, blood pressure seems to be running on the higher side more recently.  Check BMP today.  If renal function and electrolytes allow, anticipate adding low-dose losartan .  Defer rechallenge of SGLT2 inhibitor given recurrent pyelonephritis.  Scheduled for echo next month.  HTN: Blood pressure is on the higher side today.  Continue pharmacotherapy as outlined above with plans to add either losartan  or titrate Imdur  based on updated labs.  HLD: LDL 54 in 10/2023 with normal ALT at that time.  Remains on rosuvastatin  40 mg.  Carotid artery disease: Carotid artery ultrasound showed less than 40% stenosis bilaterally.  Remains on aspirin  and statin as above.     Disposition: F/u with Dr. Darron or an APP in 6 weeks.   Medication Adjustments/Labs and Tests Ordered: Current medicines are reviewed at length with the patient today.  Concerns regarding medicines are outlined above. Medication changes, Labs and Tests ordered today are summarized above and listed in the Patient Instructions accessible in Encounters.   Signed, Bernardino Bring, PA-C 12/11/2023 9:47 AM     [facility] [address] [place], [place] [address] [phone]

## 2023-12-08 NOTE — Progress Notes (Signed)
 12/08/2023 2:03 PM   Willie Clark Apr 14, 1951 [id number]  CC: Chief Complaint  Patient presents with   Follow-up   Nephrolithiasis   HPI: Willie Clark is a 73 y.o. male with PMH CAD on daily Imdur  and as needed nitroglycerin , BPH, balanitis, and nephrolithiasis with a recent history of sepsis due to E. coli UTI with pyelonephritis who presents today for symptom recheck on silodosin .   Today he reports no significant change in his voiding symptoms on silodosin .  He continues to have a weak or spraying stream, though when he takes diuretics he finds that it makes it stronger.  PVR 2 mL.  PMH: Past Medical History:  Diagnosis Date   AAA (abdominal aortic aneurysm) (HCC) 02/23/2020   a.) AAA duplex 02/23/2020: measured 3.0 cm   Acute coronary syndrome (HCC)    Anxiety    Aortic atherosclerosis (HCC)    Arthritis    ASD (atrial septal defect) 01/20/2006   a.) noted on intraoperative TEE; s/p closure   Barrett esophagus    Barrett's esophagus    Bilateral leg edema    Bilateral occipital neuralgia    Bowel obstruction (HCC)    Carpal tunnel syndrome    CHF (congestive heart failure) (HCC)    Chronic chest pain    COPD (chronic obstructive pulmonary disease) (HCC)    Coronary artery disease    a.)CABG 93; Redo 07; b.)LHC 03/15: 100 mLAD, 60 pLCx, 100 OM1, 80 pRCA, 100 mRCA - med mgmt. c.)LHC 6/17: LAD 100ost,  LCx 60p, OM1 100, 100 oRCA, VG-OM2 min irreg, VG-RPDA 30ost, 20d - med mgmt; d.)LHC 8/21: 100 oLAD, 100 oOM1-OM1, 100 o-pRCA, 100 RPL2, 100 RPL3, 100 OM2; pat grafts - med mgmt; e.)LHC 3/22: 60-70 o-pVG-OM2, 80-99 VG-RCA (DES x3); f.)R/LHC 07/22: 75 (ost) & 60 (mid) VG-OM (DES x2)   Depression    Diverticulitis    Dyshidrosis    Dysphagia    Enlarged prostate without lower urinary tract symptoms (luts)    Erectile dysfunction    Fibromyalgia    Gastric ulcer    Gastritis    GERD (gastroesophageal reflux disease)    Headache    Hemorrhoids    HFrEF (heart  failure with reduced ejection fraction) (HCC)    a.) 09/2016 Echo: EF 55-60%, no rwma, triv AI, mild MR, mildly dil LA. -> as of April 2020: EF 40 to 45%.  Septal dyssynergy/hypokinesis due to postop state but otherwise unable to assess wall motion Sharol due to poor study. b.) TTE 12/12/2019: EF 40-45%, LAE, mild AR, G1DD; c.) TTE 06/20/2020: EF 35-40%, sev inferolateral HK, mild AR, G1DD.   History of BPH    History of kidney stones    History of myocardial infarct at age less than 60 years    Hyperlipidemia    Hypertension    Hyponatremia    Idiopathic urticaria    Intermittent claudication (HCC)    Kidney stones    Left ventricular failure (HCC)    Lightheadedness    Liver disease    Long term current use of antithrombotics/antiplatelets    a.) daily DAPT therapy (ASA + clopidogrel )   Mass on back    Migraine without aura    Myocardial infarction (HCC) 1993   Nail dystrophy    Nephrolithiasis    Obstructive uropathy    Orthostatic hypotension    a. Improved after discontinuation of metoprolol .   OSA on CPAP    Peripheral neuropathy    Peripheral neuropathy  Peyronie disease    Postoperative atrial fibrillation/flutter (HCC) 01/20/2006   a.) POD2 following re-do CABG; Tx'd with IV amiodarone, which did not convert; EF 20% on POD3; DCCV (100 J x 1) POD4.   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   a.) 2v CABG: LIMA-LAD, SVG-D2   S/P cervical spinal fusion    S/P RE-DO CABG x 2 01/20/2006   a.) 2v REDO CABG: reserse SVG-PDA, SVG-OM1   Sinus bradycardia    Skin lesions, generalized    Small bowel obstruction (HCC) 02/2009   ST elevation myocardial infarction (STEMI) of inferior wall (HCC) 06/2020   a.) LHC 06/2020 --> subtotally occluded SVG-RCA with long segment of proximal graft disease and 99% stenosis at the distal anastomosis --> successful PCI to SVG to RCA with overlapping  Resolute Onyx DES x 3   Stroke [facility]) 1993   right side weakness no blood thinners   on Aspirin    Syncope and collapse    Synovial cyst of lumbar facet joint    TIA (transient ischemic attack) 08/13/2014   Urticaria     Surgical History: Past Surgical History:  Procedure Laterality Date   BACK SURGERY     BIOPSY  04/22/2023   Procedure: BIOPSY;  Surgeon: Maryruth Ole DASEN, MD;  Location: [facility] ENDOSCOPY;  Service: Endoscopy;;  Gastric and Colonic   bowel obstruction  01/2009   CARDIAC CATHETERIZATION  11/2006   patent grafts. Significant OM3 disease and occluded diagonals.   CARDIAC CATHETERIZATION  06/2010   patent grafts. significant ISR in proximal LCX but OM2 is bypassed and gives retrograde flow to OM3.    CARDIAC CATHETERIZATION  06/2013   [facility]: Patent grafts with 60% proximal in-stent restenosis in the left circumflex with FFR of 0.85   CARDIAC CATHETERIZATION Left 09/25/2015   Procedure: Left Heart Cath and Cors/Grafts Angiography;  Surgeon: Deatrice DELENA Cage, MD;  Location: [facility] INVASIVE CV LAB;  Service: Cardiovascular;  Laterality: Left;   CATARACT EXTRACTION     CERVICAL FUSION     CHOLECYSTECTOMY     COLON SURGERY  01/2009   BOWEL RESECTION DUE TO SMALL BOWEL OBSTRUCTION   COLONOSCOPY     COLONOSCOPY WITH PROPOFOL  N/A 10/28/2017   Procedure: COLONOSCOPY WITH PROPOFOL ;  Surgeon: Gaylyn Gladis PENNER, MD;  Location: [facility] ENDOSCOPY;  Service: Endoscopy;  Laterality: N/A;   COLONOSCOPY WITH PROPOFOL  N/A 07/30/2021   Procedure: COLONOSCOPY WITH PROPOFOL ;  Surgeon: Maryruth Ole DASEN, MD;  Location: [facility] ENDOSCOPY;  Service: Endoscopy;  Laterality: N/A;   COLONOSCOPY WITH PROPOFOL  N/A 04/22/2023   Procedure: COLONOSCOPY WITH PROPOFOL ;  Surgeon: Maryruth Ole DASEN, MD;  Location: [facility] ENDOSCOPY;  Service: Endoscopy;  Laterality: N/A;   CORONARY ANGIOPLASTY  2006   CORONARY ARTERY BYPASS GRAFT  1993/01/2006   redo at [facility]. LIMA to LAD, SVG to OM2 and SVG to RPDA   CORONARY  STENT INTERVENTION N/A 06/20/2020   Procedure: CORONARY STENT INTERVENTION;  Surgeon: Mady Bruckner, MD;  Location: [facility] INVASIVE CV LAB;  Service: Cardiovascular;  Laterality: N/A;   CORONARY STENT INTERVENTION N/A 11/15/2020   Procedure: CORONARY STENT INTERVENTION;  Surgeon: Mady Bruckner, MD;  Location: [facility] INVASIVE CV LAB;  Service: Cardiovascular;  Laterality: N/A;   CYSTOSCOPY WITH STENT PLACEMENT Bilateral 09/24/2015   Procedure: CYSTOSCOPY WITH BILATERAL RETROGRADES, BILATERAL STENT PLACEMENT;  Surgeon: Donnice Brooks, MD;  Location: [facility] ORS;  Service: Urology;  Laterality:  Bilateral;   ESOPHAGOGASTRODUODENOSCOPY N/A 10/25/2014   Procedure: ESOPHAGOGASTRODUODENOSCOPY (EGD);  Surgeon: Gladis RAYMOND Mariner, MD;  Location: [facility] ENDOSCOPY;  Service: Endoscopy;  Laterality: N/A;   ESOPHAGOGASTRODUODENOSCOPY (EGD) WITH PROPOFOL  N/A 02/03/2017   Procedure: ESOPHAGOGASTRODUODENOSCOPY (EGD) WITH PROPOFOL ;  Surgeon: Mariner Gladis RAYMOND, MD;  Location: [facility] ENDOSCOPY;  Service: Endoscopy;  Laterality: N/A;   ESOPHAGOGASTRODUODENOSCOPY (EGD) WITH PROPOFOL  N/A 04/18/2017   Procedure: ESOPHAGOGASTRODUODENOSCOPY (EGD) WITH PROPOFOL ;  Surgeon: Mariner Gladis RAYMOND, MD;  Location: [facility] ENDOSCOPY;  Service: Endoscopy;  Laterality: N/A;   ESOPHAGOGASTRODUODENOSCOPY (EGD) WITH PROPOFOL  N/A 04/22/2023   Procedure: ESOPHAGOGASTRODUODENOSCOPY (EGD) WITH PROPOFOL ;  Surgeon: Maryruth Ole DASEN, MD;  Location: [facility] ENDOSCOPY;  Service: Endoscopy;  Laterality: N/A;   EYE SURGERY Bilateral    Cataract Extraction with IOL   HERNIA REPAIR     INGUINAL HERNIA REPAIR Bilateral 01/23/2016   Procedure: HERNIA REPAIR INGUINAL ADULT BILATERAL;  Surgeon: Larinda Unknown Sharps, MD;  Location: [facility] ORS;  Service: General;  Laterality: Bilateral;   LEFT HEART CATH AND CORS/GRAFTS ANGIOGRAPHY N/A 12/13/2019   Procedure: LEFT HEART CATH AND CORS/GRAFTS ANGIOGRAPHY;  Surgeon: Darron Deatrice LABOR, MD;  Location: [facility] INVASIVE CV  LAB;  Service: Cardiovascular;  Laterality: N/A;   LEFT HEART CATH AND CORS/GRAFTS ANGIOGRAPHY N/A 06/20/2020   Procedure: LEFT HEART CATH AND CORS/GRAFTS ANGIOGRAPHY;  Surgeon: Mady Bruckner, MD;  Location: [facility] INVASIVE CV LAB;  Service: Cardiovascular;  Laterality: N/A;   LEFT HEART CATH AND CORS/GRAFTS ANGIOGRAPHY N/A 11/18/2023   Procedure: LEFT HEART CATH AND CORS/GRAFTS ANGIOGRAPHY;  Surgeon: Darron Deatrice LABOR, MD;  Location: [facility] INVASIVE CV LAB;  Service: Cardiovascular;  Laterality: N/A;   LUMBAR LAMINECTOMY/DECOMPRESSION MICRODISCECTOMY Left 10/01/2016   Procedure: Left Lumbar four-five Laminotomy for resection of synovial cyst;  Surgeon: Unice Pac, MD;  Location: [facility] OR;  Service: Neurosurgery;  Laterality: Left;  left   NECK SURGERY  06/2009   POLYPECTOMY  04/22/2023   Procedure: POLYPECTOMY;  Surgeon: Maryruth Ole DASEN, MD;  Location: [facility] ENDOSCOPY;  Service: Endoscopy;;  Colon   RIGHT/LEFT HEART CATH AND CORONARY/GRAFT ANGIOGRAPHY N/A 11/15/2020   Procedure: RIGHT/LEFT HEART CATH AND CORONARY/GRAFT ANGIOGRAPHY;  Surgeon: Mady Bruckner, MD;  Location: [facility] INVASIVE CV LAB;  Service: Cardiovascular;  Laterality: N/A;   SHOULDER SURGERY Bilateral 2010    Home Medications:  Allergies as of 12/08/2023       Reactions   Amitriptyline Other (See Comments)   Pt unsure of what happens Other reaction(s): Other (See Comments) Pt unsure of what happens Pt unsure of what happens   Amoxicillin -pot Clavulanate Itching   Cyclobenzaprine Hives, Other (See Comments)   Pt unsure what hapens   Dicyclomine  Shortness Of Breath, Swelling   Sacubitril-valsartan Anaphylaxis, Itching   Icosapent  Ethyl (epa Ethyl Ester) (fish) Other (See Comments)   tongue felt slighlty swollen/different after starting   Amitriptyline Hcl    [employer] Agent (non-screening) Diarrhea   Hydrocodone     Fish Oil Itching   Lidocaine  Nausea And Vomiting, Other (See Comments), Nausea Only   Nasal  Spray (only time had reaction) Other reaction(s): Dizziness Nasal Spray (only time had reaction)   Omega-3 Fatty Acids-vitamin E Itching   Other Diarrhea, Itching   Reactive agents: Onions, bacon, steak   Oxycodone -acetaminophen  Other (See Comments)   Lowers BP Other reaction(s): Other (See Comments) Lowers BP Lowers BP   Sulfa Antibiotics Itching        Medication List        Accurate as of December 08, 2023  2:03 PM.  If you have any questions, ask your nurse or doctor.          PAUSE taking these medications    potassium chloride  SA 20 MEQ tablet Wait to take this until your doctor or other care provider tells you to start again. Commonly known as: KLOR-CON  M Take 1 tablet (20 mEq total) by mouth daily.       TAKE these medications    acetaminophen  500 MG tablet Commonly known as: TYLENOL  Take 1,000 mg by mouth every 6 (six) hours as needed for mild pain.   albuterol  108 (90 Base) MCG/ACT inhaler Commonly known as: VENTOLIN  HFA Inhale 1 puff into the lungs as needed.   aspirin  EC 81 MG tablet Take 1 tablet (81 mg total) by mouth daily. Swallow whole.   benzonatate 100 MG capsule Commonly known as: TESSALON Take 100 mg by mouth 3 (three) times daily as needed for cough.   carvedilol  3.125 MG tablet Commonly known as: COREG  Take 1 tablet (3.125 mg total) by mouth 2 (two) times daily with a meal.   citalopram  20 MG tablet Commonly known as: CELEXA  Take 20 mg by mouth daily.   clobetasol  cream 0.05 % Commonly known as: TEMOVATE  APPLY TO ITCHY AREAS ON ELBOWS TWICE A DAY UNTIL IMPROVED. AVOID FACE, GROIN, UNDERARMS.   clopidogrel  75 MG tablet Commonly known as: PLAVIX  TAKE 1 TABLET EVERY DAY   cyanocobalamin  1000 MCG tablet Commonly known as: VITAMIN B12 Take 1,000 mcg by mouth daily.   DAILY FIBER PO Take 1 tablet by mouth daily.   EPINEPHrine  0.3 mg/0.3 mL Soaj injection Commonly known as: EPI-PEN Inject 0.3 mg into the muscle as needed.    fexofenadine 180 MG tablet Commonly known as: ALLEGRA Take 180 mg by mouth daily.   gabapentin  400 MG capsule Commonly known as: NEURONTIN  Take 400-800 mg by mouth 3 (three) times daily.   HAIR SKIN AND NAILS FORMULA PO Take 1 capsule by mouth daily.   isosorbide  mononitrate 60 MG 24 hr tablet Commonly known as: IMDUR  Take 1 tablet (60 mg total) by mouth daily.   nitroGLYCERIN  0.4 MG SL tablet Commonly known as: NITROSTAT  Place 1 tablet (0.4 mg total) under the tongue every 5 (five) minutes as needed for chest pain.   pantoprazole  40 MG tablet Commonly known as: PROTONIX  Take 40 mg by mouth 2 (two) times daily.   PROBIOTIC PO Take by mouth daily at 8 pm.   rOPINIRole  1 MG tablet Commonly known as: REQUIP  Take 1 mg by mouth 2 (two) times daily.   rosuvastatin  40 MG tablet Commonly known as: CRESTOR  Take 1 tablet (40 mg total) by mouth daily.   silodosin  8 MG Caps capsule Commonly known as: RAPAFLO  Take 1 capsule (8 mg total) by mouth daily with breakfast.   spironolactone  25 MG tablet Commonly known as: ALDACTONE  Take 1 tablet (25 mg total) by mouth daily.   torsemide  20 MG tablet Commonly known as: DEMADEX  Take 2 tablets (40 mg total) by mouth daily.   traMADol  50 MG tablet Commonly known as: ULTRAM  Take 1 tablet (50 mg total) by mouth every 6 (six) hours as needed for up to 15 doses for severe pain or moderate pain.        Allergies:  Allergies  Allergen Reactions   Amitriptyline Other (See Comments)    Pt unsure of what happens Other reaction(s): Other (See Comments) Pt unsure of what happens Pt unsure of what happens   Amoxicillin -Pot Clavulanate Itching  Cyclobenzaprine Hives and Other (See Comments)    Pt unsure what hapens   Dicyclomine  Shortness Of Breath and Swelling   Sacubitril-Valsartan Anaphylaxis and Itching   Icosapent  Ethyl (Epa Ethyl Ester) (Fish) Other (See Comments)    tongue felt slighlty swollen/different after starting    Amitriptyline Hcl    [employer] Agent (Non-Screening) Diarrhea   Hydrocodone     Fish Oil Itching   Lidocaine  Nausea And Vomiting, Other (See Comments) and Nausea Only    Nasal Spray (only time had reaction) Other reaction(s): Dizziness Nasal Spray (only time had reaction)   Omega-3 Fatty Acids-Vitamin E Itching   Other Diarrhea and Itching    Reactive agents: Onions, bacon, steak   Oxycodone -Acetaminophen  Other (See Comments)    Lowers BP Other reaction(s): Other (See Comments) Lowers BP Lowers BP   Sulfa Antibiotics Itching    Family History: Family History  Problem Relation Age of Onset   Heart disease Father 35   Heart attack Father    Breast cancer Paternal Aunt     Social History:   reports that he quit smoking about 32 years ago. His smoking use included cigarettes. He has never used smokeless tobacco. He reports that he does not currently use alcohol . He reports that he does not use drugs.  Physical Exam: BP (!) 144/81 (BP Location: Left Arm, Patient Position: Sitting, Cuff Size: Normal)   Pulse 78   Ht 5' 8 (1.727 m)   Wt 192 lb 6.4 oz (87.3 kg)   SpO2 96%   BMI 29.25 kg/m   Constitutional:  Alert and oriented, no acute distress, nontoxic appearing HEENT: [place], AT Cardiovascular: No clubbing, cyanosis, or edema Respiratory: Normal respiratory effort, no increased work of breathing Skin: No rashes, bruises or suspicious lesions Neurologic: Grossly intact, no focal deficits, moving all 4 extremities Psychiatric: Normal mood and affect  Laboratory Data: Results for orders placed or performed in visit on 12/08/23  Bladder Scan (Post Void Residual) in office   Collection Time: 12/08/23  4:30 PM  Result Value Ref Range   Scan Result 2 ml   Assessment & Plan:   1. Benign prostatic hyperplasia with weak urinary stream (Primary) No symptomatic improvement on silodosin , though he is emptying appropriately.  Okay to stop this, and we discussed that if his  symptoms worsen off it, I am happy to refill it.  I offered him cystoscopy for further evaluation of his symptoms, but he declined.  Will see him back next year for annual follow-up. - Bladder Scan (Post Void Residual) in office   Return in about 1 year (around 12/07/2024) for Annual IPSS/PVR.  Lucie Hones, PA-C  [facility] Urology [place] [address] [place], [place] [address] [phone]

## 2023-12-09 ENCOUNTER — Ambulatory Visit: Admitting: Physician Assistant

## 2023-12-11 ENCOUNTER — Ambulatory Visit: Attending: Physician Assistant | Admitting: Physician Assistant

## 2023-12-11 ENCOUNTER — Encounter: Payer: Self-pay | Admitting: Physician Assistant

## 2023-12-11 VITALS — BP 144/86 | HR 65 | Ht 68.0 in | Wt 195.0 lb

## 2023-12-11 DIAGNOSIS — I5022 Chronic systolic (congestive) heart failure: Secondary | ICD-10-CM | POA: Diagnosis not present

## 2023-12-11 DIAGNOSIS — I25118 Atherosclerotic heart disease of native coronary artery with other forms of angina pectoris: Secondary | ICD-10-CM | POA: Diagnosis not present

## 2023-12-11 DIAGNOSIS — I779 Disorder of arteries and arterioles, unspecified: Secondary | ICD-10-CM

## 2023-12-11 DIAGNOSIS — I10 Essential (primary) hypertension: Secondary | ICD-10-CM | POA: Diagnosis not present

## 2023-12-11 DIAGNOSIS — E785 Hyperlipidemia, unspecified: Secondary | ICD-10-CM

## 2023-12-11 DIAGNOSIS — I255 Ischemic cardiomyopathy: Secondary | ICD-10-CM | POA: Diagnosis not present

## 2023-12-11 NOTE — Patient Instructions (Signed)
 Medication Instructions:  Your physician recommends that you continue on your current medications as directed. Please refer to the Current Medication list given to you today.   *If you need a refill on your cardiac medications before your next appointment, please call your pharmacy*  Lab Work: Your provider would like for you to have following labs drawn today BMeT and CBC.   If you have labs (blood work) drawn today and your tests are completely normal, you will receive your results only by: MyChart Message (if you have MyChart) OR A paper copy in the mail If you have any lab test that is abnormal or we need to change your treatment, we will call you to review the results.  Follow-Up: At [facility], you and your health needs are our priority.  As part of our continuing mission to provide you with exceptional heart care, our providers are all part of one team.  This team includes your primary Cardiologist (physician) and Advanced Practice Providers or APPs (Physician Assistants and Nurse Practitioners) who all work together to provide you with the care you need, when you need it.  Your next appointment:   6 week(s)  Provider:   You may see Deatrice Cage, MD or one of the following Advanced Practice Providers on your designated Care Team:   Lonni Meager, NP Lesley Maffucci, PA-C Bernardino Bring, PA-C Cadence Stockton, PA-C Tylene Lunch, NP Barnie Hila, NP

## 2023-12-12 LAB — BASIC METABOLIC PANEL WITH GFR
BUN/Creatinine Ratio: 9 — ABNORMAL LOW (ref 10–24)
BUN: 8 mg/dL (ref 8–27)
CO2: 26 mmol/L (ref 20–29)
Calcium: 9.7 mg/dL (ref 8.6–10.2)
Chloride: 99 mmol/L (ref 96–106)
Creatinine, Ser: 0.88 mg/dL (ref 0.76–1.27)
Glucose: 159 mg/dL — ABNORMAL HIGH (ref 70–99)
Potassium: 4.3 mmol/L (ref 3.5–5.2)
Sodium: 141 mmol/L (ref 134–144)
eGFR: 91 mL/min/1.73 (ref >59)

## 2023-12-12 LAB — CBC
Hematocrit: 43.1 % (ref 37.5–51.0)
Hemoglobin: 14.4 g/dL (ref 13.0–17.7)
MCH: 34 pg — ABNORMAL HIGH (ref 26.6–33.0)
MCHC: 33.4 g/dL (ref 31.5–35.7)
MCV: 102 fL — ABNORMAL HIGH (ref 79–97)
Platelets: 183 x10E3/uL (ref 150–450)
RBC: 4.24 x10E6/uL (ref 4.14–5.80)
RDW: 13 % (ref 11.6–15.4)
WBC: 7.9 x10E3/uL (ref 3.4–10.8)

## 2023-12-16 ENCOUNTER — Ambulatory Visit: Payer: Self-pay | Admitting: Physician Assistant

## 2024-01-01 ENCOUNTER — Ambulatory Visit: Attending: Physician Assistant

## 2024-01-01 DIAGNOSIS — I25110 Atherosclerotic heart disease of native coronary artery with unstable angina pectoris: Secondary | ICD-10-CM

## 2024-01-01 DIAGNOSIS — I5022 Chronic systolic (congestive) heart failure: Secondary | ICD-10-CM

## 2024-01-01 DIAGNOSIS — I25118 Atherosclerotic heart disease of native coronary artery with other forms of angina pectoris: Secondary | ICD-10-CM

## 2024-01-01 DIAGNOSIS — I200 Unstable angina: Secondary | ICD-10-CM

## 2024-01-01 LAB — ECHOCARDIOGRAM COMPLETE
AR max vel: 2.02 cm2
AV Area VTI: 1.63 cm2
AV Area mean vel: 1.8 cm2
AV Mean grad: 5 mmHg
AV Peak grad: 7.6 mmHg
Ao pk vel: 1.38 m/s
Area-P 1/2: 2.51 cm2
P 1/2 time: 565 ms
S' Lateral: 4.2 cm

## 2024-01-01 MED ORDER — PERFLUTREN LIPID MICROSPHERE
1.0000 mL | INTRAVENOUS | Status: AC | PRN
  Administered 2024-01-01: 3 mL via INTRAVENOUS

## 2024-01-10 ENCOUNTER — Other Ambulatory Visit: Payer: Self-pay | Admitting: Cardiovascular Disease

## 2024-01-10 DIAGNOSIS — I200 Unstable angina: Secondary | ICD-10-CM

## 2024-01-10 DIAGNOSIS — I25118 Atherosclerotic heart disease of native coronary artery with other forms of angina pectoris: Secondary | ICD-10-CM

## 2024-01-22 ENCOUNTER — Other Ambulatory Visit: Payer: Self-pay | Admitting: Cardiovascular Disease

## 2024-01-23 ENCOUNTER — Ambulatory Visit: Attending: Nurse Practitioner | Admitting: Nurse Practitioner

## 2024-01-23 ENCOUNTER — Encounter: Payer: Self-pay | Admitting: Nurse Practitioner

## 2024-01-23 VITALS — BP 130/70 | HR 72 | Ht 68.0 in | Wt 203.2 lb

## 2024-01-23 DIAGNOSIS — E785 Hyperlipidemia, unspecified: Secondary | ICD-10-CM

## 2024-01-23 DIAGNOSIS — I255 Ischemic cardiomyopathy: Secondary | ICD-10-CM | POA: Diagnosis not present

## 2024-01-23 DIAGNOSIS — I5022 Chronic systolic (congestive) heart failure: Secondary | ICD-10-CM

## 2024-01-23 DIAGNOSIS — I25118 Atherosclerotic heart disease of native coronary artery with other forms of angina pectoris: Secondary | ICD-10-CM

## 2024-01-23 DIAGNOSIS — I10 Essential (primary) hypertension: Secondary | ICD-10-CM | POA: Diagnosis not present

## 2024-01-23 MED ORDER — RANOLAZINE ER 500 MG PO TB12: 500.0000 mg | ORAL_TABLET | Freq: Two times a day (BID) | ORAL | 3 refills | Status: DC

## 2024-01-23 NOTE — Patient Instructions (Signed)
 Medication Instructions:  - START ranexa  500 mg twice daily   *If you need a refill on your cardiac medications before your next appointment, please call your pharmacy*  Lab Work: No labs ordered today  If you have labs (blood work) drawn today and your tests are completely normal, you will receive your results only by: MyChart Message (if you have MyChart) OR A paper copy in the mail If you have any lab test that is abnormal or we need to change your treatment, we will call you to review the results.  Testing/Procedures: Come back to our office in 1 week on 01/30/24 for a nurse visit to get your EKG done.   Follow-Up: At [facility], you and your health needs are our priority.  As part of our continuing mission to provide you with exceptional heart care, our providers are all part of one team.  This team includes your primary Cardiologist (physician) and Advanced Practice Providers or APPs (Physician Assistants and Nurse Practitioners) who all work together to provide you with the care you need, when you need it.  Your next appointment:   2 month(s)  Provider:   Deatrice Cage, MD or Bernardino Bring, PA-C    We recommend signing up for the patient portal called MyChart.  Sign up information is provided on this After Visit Summary.  MyChart is used to connect with patients for Virtual Visits (Telemedicine).  Patients are able to view lab/test results, encounter notes, upcoming appointments, etc.  Non-urgent messages can be sent to your provider as well.   To learn more about what you can do with MyChart, go to [email or web address].

## 2024-01-23 NOTE — Progress Notes (Signed)
 Office Visit    Patient Name: Willie Clark Date of Encounter: 01/23/2024  Primary Care Provider:  [facility] Primary Cardiologist:  Deatrice Cage, MD  Cardiology APP:  Abigail Bernardino Willie Clark   Chief Complaint    73 y.o. 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  Past Medical History   Subjective   Past Medical History:  Diagnosis Date   AAA (abdominal aortic aneurysm) 02/23/2020   a.) AAA duplex 02/23/2020: measured 3.0 cm   Acute coronary syndrome (HCC)    Anxiety    Aortic atherosclerosis    Arthritis    ASD (atrial septal defect) 01/20/2006   a.) noted on intraoperative TEE; s/p closure   Barrett esophagus    Barrett's esophagus    Bilateral leg edema    Bilateral occipital neuralgia    Bowel obstruction (HCC)    Carpal tunnel syndrome    Chronic chest pain    Chronic HFrEF (heart failure with reduced ejection fraction) (HCC)    a. 12/2023 Echo: EF 35-40%, glob HK, GrI DD, mildly reduced RV fxn, mild MR/AI.   COPD (chronic obstructive pulmonary disease) (HCC)    Coronary artery disease    a. CABG 93; Redo 07; b. LHC 09/2015, 11/2019: Multivessel disease, patent grafts; c. LHC 06/2020: 80-99 VG-RCA (DES x3); d. R/LHC 07/22: 75 (ost) & 60 (mid) VG-OM (DES x2); e. 10/2023 Cath: Multivessel CAD with occlusion of the vein graft to the obtuse marginal.  LIMA to LAD patent, vein graft to the distal RCA patent.  Medical therapy.   Depression    Diverticulitis    Dyshidrosis    Dysphagia    Enlarged prostate without lower urinary tract symptoms (luts)    Erectile dysfunction    Fibromyalgia    Gastric ulcer    Gastritis    GERD  (gastroesophageal reflux disease)    Headache    Hemorrhoids    HFrEF (heart failure with reduced ejection fraction) (HCC)    a.) 09/2016 Echo: EF 55-60%, no rwma, triv AI, mild MR, mildly dil LA. -> as of April 2020: EF 40 to 45%.  Septal dyssynergy/hypokinesis due to postop state but otherwise unable to assess wall motion Sharol due to poor study. b.) TTE 12/12/2019: EF 40-45%, LAE, mild AR, G1DD; c.) TTE 06/20/2020: EF 35-40%, sev inferolateral HK, mild AR, G1DD.   History of BPH    History of kidney stones    History of myocardial infarct at age less than 60 years    Hyperlipidemia    Hypertension    Hyponatremia    Idiopathic urticaria    Intermittent claudication    Ischemic cardiomyopathy    Kidney stones    Left ventricular failure (HCC)    Lightheadedness    Liver disease    Long term current use of antithrombotics/antiplatelets    a.) daily DAPT therapy (ASA + clopidogrel )   Mass on back    Migraine without aura    Myocardial infarction (HCC) 1993   Nail dystrophy    Nephrolithiasis    Obstructive uropathy    Orthostatic hypotension    a. Improved after discontinuation of metoprolol .   OSA on CPAP  Peripheral neuropathy    Peripheral neuropathy    Peyronie disease    Postoperative atrial fibrillation/flutter (HCC) 01/20/2006   a.) POD2 following re-do CABG; Tx'd with IV amiodarone, which did not convert; EF 20% on POD3; DCCV (100 J x 1) POD4.   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   a.) 2v CABG: LIMA-LAD, SVG-D2   S/P cervical spinal fusion    S/P RE-DO CABG x 2 01/20/2006   a.) 2v REDO CABG: reserse SVG-PDA, SVG-OM1   Sinus bradycardia    Skin lesions, generalized    Small bowel obstruction (HCC) 02/2009   ST elevation myocardial infarction (STEMI) of inferior wall (HCC) 06/2020   a.) LHC 06/2020 --> subtotally occluded SVG-RCA with long segment of proximal graft  disease and 99% stenosis at the distal anastomosis --> successful PCI to SVG to RCA with overlapping Resolute Onyx DES x 3   Stroke [facility]) 1993   right side weakness no blood thinners   on Aspirin    Syncope and collapse    Synovial cyst of lumbar facet joint    TIA (transient ischemic attack) 08/13/2014   Urticaria    Past Surgical History:  Procedure Laterality Date   BACK SURGERY     BIOPSY  04/22/2023   Procedure: BIOPSY;  Surgeon: Maryruth Ole DASEN, MD;  Location: [facility] ENDOSCOPY;  Service: Endoscopy;;  Gastric and Colonic   bowel obstruction  01/2009   CARDIAC CATHETERIZATION  11/2006   patent grafts. Significant OM3 disease and occluded diagonals.   CARDIAC CATHETERIZATION  06/2010   patent grafts. significant ISR in proximal LCX but OM2 is bypassed and gives retrograde flow to OM3.    CARDIAC CATHETERIZATION  06/2013   [facility]: Patent grafts with 60% proximal in-stent restenosis in the left circumflex with FFR of 0.85   CARDIAC CATHETERIZATION Left 09/25/2015   Procedure: Left Heart Cath and Cors/Grafts Angiography;  Surgeon: Deatrice DELENA Cage, MD;  Location: [facility] INVASIVE CV LAB;  Service: Cardiovascular;  Laterality: Left;   CATARACT EXTRACTION     CERVICAL FUSION     CHOLECYSTECTOMY     COLON SURGERY  01/2009   BOWEL RESECTION DUE TO SMALL BOWEL OBSTRUCTION   COLONOSCOPY     COLONOSCOPY WITH PROPOFOL  N/A 10/28/2017   Procedure: COLONOSCOPY WITH PROPOFOL ;  Surgeon: Gaylyn Gladis PENNER, MD;  Location: [facility] ENDOSCOPY;  Service: Endoscopy;  Laterality: N/A;   COLONOSCOPY WITH PROPOFOL  N/A 07/30/2021   Procedure: COLONOSCOPY WITH PROPOFOL ;  Surgeon: Maryruth Ole DASEN, MD;  Location: [facility] ENDOSCOPY;  Service: Endoscopy;  Laterality: N/A;   COLONOSCOPY WITH PROPOFOL  N/A 04/22/2023   Procedure: COLONOSCOPY WITH PROPOFOL ;  Surgeon: Maryruth Ole DASEN, MD;  Location: [facility] ENDOSCOPY;  Service: Endoscopy;  Laterality: N/A;   CORONARY ANGIOPLASTY  2006   CORONARY ARTERY BYPASS GRAFT   1993/01/2006   redo at [facility]. LIMA to LAD, SVG to OM2 and SVG to RPDA   CORONARY STENT INTERVENTION N/A 06/20/2020   Procedure: CORONARY STENT INTERVENTION;  Surgeon: Mady Bruckner, MD;  Location: [facility] INVASIVE CV LAB;  Service: Cardiovascular;  Laterality: N/A;   CORONARY STENT INTERVENTION N/A 11/15/2020   Procedure: CORONARY STENT INTERVENTION;  Surgeon: Mady Bruckner, MD;  Location: [facility] INVASIVE CV LAB;  Service: Cardiovascular;  Laterality: N/A;   CYSTOSCOPY WITH STENT PLACEMENT Bilateral 09/24/2015   Procedure: CYSTOSCOPY WITH BILATERAL RETROGRADES, BILATERAL STENT PLACEMENT;  Surgeon: Donnice Brooks, MD;  Location:  [facility] ORS;  Service: Urology;  Laterality: Bilateral;   ESOPHAGOGASTRODUODENOSCOPY N/A 10/25/2014   Procedure: ESOPHAGOGASTRODUODENOSCOPY (EGD);  Surgeon: Gladis RAYMOND Mariner, MD;  Location: [facility] ENDOSCOPY;  Service: Endoscopy;  Laterality: N/A;   ESOPHAGOGASTRODUODENOSCOPY (EGD) WITH PROPOFOL  N/A 02/03/2017   Procedure: ESOPHAGOGASTRODUODENOSCOPY (EGD) WITH PROPOFOL ;  Surgeon: Mariner Gladis RAYMOND, MD;  Location: [facility] ENDOSCOPY;  Service: Endoscopy;  Laterality: N/A;   ESOPHAGOGASTRODUODENOSCOPY (EGD) WITH PROPOFOL  N/A 04/18/2017   Procedure: ESOPHAGOGASTRODUODENOSCOPY (EGD) WITH PROPOFOL ;  Surgeon: Mariner Gladis RAYMOND, MD;  Location: [facility] ENDOSCOPY;  Service: Endoscopy;  Laterality: N/A;   ESOPHAGOGASTRODUODENOSCOPY (EGD) WITH PROPOFOL  N/A 04/22/2023   Procedure: ESOPHAGOGASTRODUODENOSCOPY (EGD) WITH PROPOFOL ;  Surgeon: Maryruth Ole DASEN, MD;  Location: [facility] ENDOSCOPY;  Service: Endoscopy;  Laterality: N/A;   EYE SURGERY Bilateral    Cataract Extraction with IOL   HERNIA REPAIR     INGUINAL HERNIA REPAIR Bilateral 01/23/2016   Procedure: HERNIA REPAIR INGUINAL ADULT BILATERAL;  Surgeon: Larinda Unknown Sharps, MD;  Location: [facility] ORS;  Service: General;  Laterality: Bilateral;   LEFT HEART CATH AND CORS/GRAFTS ANGIOGRAPHY N/A 12/13/2019   Procedure: LEFT HEART CATH AND  CORS/GRAFTS ANGIOGRAPHY;  Surgeon: Darron Deatrice LABOR, MD;  Location: [facility] INVASIVE CV LAB;  Service: Cardiovascular;  Laterality: N/A;   LEFT HEART CATH AND CORS/GRAFTS ANGIOGRAPHY N/A 06/20/2020   Procedure: LEFT HEART CATH AND CORS/GRAFTS ANGIOGRAPHY;  Surgeon: Mady Bruckner, MD;  Location: [facility] INVASIVE CV LAB;  Service: Cardiovascular;  Laterality: N/A;   LEFT HEART CATH AND CORS/GRAFTS ANGIOGRAPHY N/A 11/18/2023   Procedure: LEFT HEART CATH AND CORS/GRAFTS ANGIOGRAPHY;  Surgeon: Darron Deatrice LABOR, MD;  Location: [facility] INVASIVE CV LAB;  Service: Cardiovascular;  Laterality: N/A;   LUMBAR LAMINECTOMY/DECOMPRESSION MICRODISCECTOMY Left 10/01/2016   Procedure: Left Lumbar four-five Laminotomy for resection of synovial cyst;  Surgeon: Unice Pac, MD;  Location: [facility] OR;  Service: Neurosurgery;  Laterality: Left;  left   NECK SURGERY  06/2009   POLYPECTOMY  04/22/2023   Procedure: POLYPECTOMY;  Surgeon: Maryruth Ole DASEN, MD;  Location: [facility] ENDOSCOPY;  Service: Endoscopy;;  Colon   RIGHT/LEFT HEART CATH AND CORONARY/GRAFT ANGIOGRAPHY N/A 11/15/2020   Procedure: RIGHT/LEFT HEART CATH AND CORONARY/GRAFT ANGIOGRAPHY;  Surgeon: Mady Bruckner, MD;  Location: [facility] INVASIVE CV LAB;  Service: Cardiovascular;  Laterality: N/A;   SHOULDER SURGERY Bilateral 2010    Allergies  Allergies  Allergen Reactions   Amitriptyline Other (See Comments)    Pt unsure of what happens Other reaction(s): Other (See Comments) Pt unsure of what happens Pt unsure of what happens   Amoxicillin -Pot Clavulanate Itching   Cyclobenzaprine Hives and Other (See Comments)    Pt unsure what hapens   Dicyclomine  Shortness Of Breath and Swelling   Sacubitril-Valsartan Anaphylaxis and Itching    Other Reaction(s): anaphylaxis after starting, required epi at urgent care PRESUMED DUE TO sacubitril since pt tolerated losartan  previously await cardiology recommendations re: ARB   Icosapent  Ethyl (Epa Ethyl Ester) (Fish)  Other (See Comments)    tongue felt slighlty swollen/different after starting   Amitriptyline Hcl    [employer] Agent (Non-Screening) Diarrhea   Hydrocodone     Fish Oil Itching   Lidocaine  Nausea And Vomiting, Other (See Comments) and Nausea Only    Nasal Spray (only time had reaction) Other reaction(s): Dizziness Nasal Spray (only time had reaction)   Omega-3 Fatty Acids-Vitamin E Itching   Other Diarrhea and Itching    Reactive agents: Onions, bacon, steak   Oxycodone -Acetaminophen  Other (See Comments)    Lowers BP Other reaction(s):  Other (See Comments) Lowers BP Lowers BP   Sulfa Antibiotics Itching       History of Present Illness      72 y.o. 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  Regarding his CAD, he underwent CABG in 1993 with redo CABG in 2007.  He was admitted in 11/2019 with chest pain.  Echo showed an EF of 40 to 45%.  LHC showed severe underlying three-vessel coronary arteries with patent grafts and no significant change since 2017, and he was medically managed with GDMT optimization.  He was switched to Entresto  at 1 point but this was discontinued due to allergic reaction (rash/itching) that required ED evaluation.  In March 2022, he was evaluated emergency department with unstable angina and ECG worrisome for inferior ST segment elevation.  He underwent emergent diagnostic catheterization showing severe native three-vessel disease with patent circumflex stent, moderate disease in the vein graft to the OM 2, and subtotally occluded vein graft to the right coronary artery with 99% stenosis at the distal anastomosis, which was successfully treated with a drug-eluting stent.  Echo during admission  showed EF of 35 to 40%.  He required repeat admission and diagnostic catheterization in July 2022 showing progression of disease and vein graft to the obtuse marginal and this was treated with 2 drug-eluting stents.  Echo in  February 2023 showed persistent mild LV dysfunction with an EF of 45 to 50%.  Stress testing March 2023 with showed inferolateral ischemia but was overall low to moderate risk and he was managed medically.  In June 2025, he was admitted severe sepsis in the setting of acute pyelonephritis complicated by recurrent nephrolithiasis and Enterobacterales bacteremia.  In July 2025 he reported worsening exertional dyspnea and angina underwent diagnostic catheterization revealing severe underlying three-vessel CAD with interval occlusion of the vein graft to the obtuse marginal, patent LIMA to LAD, and and patent vein graft to the RCA occlusion of the vein graft to the obtuse marginal was felt to be culprit for increased angina and medical therapy was recommended.  Echocardiogram in September 2025 showed EF of 35 to 40% with grade 1 diastolic dysfunction, mildly reduced RV function, and mild MR/AI.     Today, Mr. Grieger reports ongoing dyspnea on exertion with minimal activity.  He is somewhat constant sternal chest discomfort over the past several years that is worse with palpation.  In addition however, he experiences fairly typical exertional angina and chest pressure with small amounts of activity.  At slightly higher levels of exertion, this may become associated with nausea and sometimes vomiting.  To combat this, he has been limiting his activity for many months.  His weight fluctuates based on his ability to take his torsemide .  He sometimes will avoid it in the mornings if he has things to do.  He is busy taking care of his wife who has advancing dementia.  He is otherwise compliant with his medications.  Weight typically trends about 193 to 195 pounds at home.  He was 197 this morning  and does note mild right greater than left lower extremity swelling.  He denies palpitations, PND, orthopnea, or syncope.  He has noted a drop in his appetite.  He also continue experience orthostatic lightheadedness and says  he has documented pressures in the 80s to 90s at home associated with lightheadedness after standing. Objective   Home Medications    Current Outpatient Medications  Medication Sig Dispense Refill   acetaminophen  (TYLENOL ) 500 MG tablet Take 1,000 mg by mouth every 6 (six) hours as needed for mild pain.     albuterol  (VENTOLIN  HFA) 108 (90 Base) MCG/ACT inhaler Inhale 1 puff into the lungs as needed.     aspirin  EC 81 MG EC tablet Take 1 tablet (81 mg total) by mouth daily. Swallow whole. 30 tablet 11   benzonatate (TESSALON) 100 MG capsule Take 100 mg by mouth 3 (three) times daily as needed for cough.     citalopram  (CELEXA ) 20 MG tablet Take 20 mg by mouth daily.      clobetasol  cream (TEMOVATE ) 0.05 % APPLY TO ITCHY AREAS ON ELBOWS TWICE A DAY UNTIL IMPROVED. AVOID FACE, GROIN, UNDERARMS. 30 g 1   clopidogrel  (PLAVIX ) 75 MG tablet TAKE 1 TABLET EVERY DAY 90 tablet 0   EPINEPHrine  0.3 mg/0.3 mL IJ SOAJ injection Inject 0.3 mg into the muscle as needed.     fexofenadine (ALLEGRA) 180 MG tablet Take 180 mg by mouth daily.     gabapentin  (NEURONTIN ) 400 MG capsule Take 400-800 mg by mouth 3 (three) times daily.     isosorbide  mononitrate (IMDUR ) 60 MG 24 hr tablet TAKE 1 TABLET EVERY DAY 90 tablet 3   Multiple Vitamins-Minerals (HAIR SKIN AND NAILS FORMULA PO) Take 1 capsule by mouth daily.     nitroGLYCERIN  (NITROSTAT ) 0.4 MG SL tablet Place 1 tablet (0.4 mg total) under the tongue every 5 (five) minutes as needed for chest pain. 90 tablet 1   pantoprazole  (PROTONIX ) 40 MG tablet Take 40 mg by mouth 2 (two) times daily.      Probiotic Product (PROBIOTIC PO) Take by mouth daily at 8 pm.     Psyllium (DAILY FIBER PO) Take 1 tablet by mouth daily.     ranolazine  (RANEXA )  500 MG 12 hr tablet Take 1 tablet (500 mg total) by mouth 2 (two) times daily. 60 tablet 3   rOPINIRole  (REQUIP ) 1 MG tablet Take 1 mg by mouth 2 (two) times daily.     rosuvastatin  (CRESTOR ) 40 MG tablet Take 1 tablet (40 mg total) by mouth daily. 90 tablet 0   spironolactone  (ALDACTONE ) 25 MG tablet Take 1 tablet (25 mg total) by mouth daily. 90 tablet 3   torsemide  (DEMADEX ) 20 MG tablet Take 2 tablets (40 mg total) by mouth daily. 180 tablet 3   traMADol  (ULTRAM ) 50 MG tablet Take 1 tablet (50 mg total) by mouth every 6 (six) hours as needed for up to 15 doses for severe pain or moderate pain. 15 tablet 0   vitamin B-12 (CYANOCOBALAMIN ) 1000 MCG tablet Take 1,000 mcg by mouth daily.     carvedilol  (COREG ) 3.125 MG tablet TAKE 1 TABLET (3.125 MG TOTAL) BY MOUTH 2 (TWO) TIMES DAILY WITH A MEAL. 180 tablet 3   [Paused] potassium chloride  SA (KLOR-CON  M) 20 MEQ tablet Take 1 tablet (20 mEq total) by mouth daily. (Patient not taking: Reported on 01/23/2024) 90 tablet 0   No current facility-administered medications for this visit.   Facility-Administered Medications Ordered in Other Visits  Medication Dose Route Frequency Provider Last Rate Last Admin   indocyanine green  (IC-GREEN ) injection 5 mg  5 mg Intravenous Once Sakai, Isami, DO         Physical Exam  VS:  BP 130/70 (BP Location: Left Arm, Patient Position: Sitting, Cuff Size: Normal)   Pulse 72   Ht 5' 8 (1.727 m)   Wt 203 lb 4 oz (92.2 kg)   SpO2 92%   BMI 30.90 kg/m  , BMI Body mass index is 30.9 kg/m.          GEN: Well nourished, well developed, in no acute distress. HEENT: normal. Neck: Supple, no JVD, carotid bruits, or masses. Cardiac: RRR, no murmurs, rubs, or gallops. No clubbing, cyanosis, 2+ right lower extremity and 1+ left lower extremity edema.  Radials 2+/PT 2+ and equal bilaterally.  Respiratory:  Respirations regular and unlabored, clear to auscultation bilaterally. GI: Soft, nontender, nondistended, BS +  x 4. MS: no deformity or atrophy. Skin: warm and dry, no rash. Neuro:  Strength and sensation are intact. Psych: Normal affect.  Accessory Clinical Findings    ECG personally reviewed by me today - EKG Interpretation Date/Time:  Friday January 23 2024 08:27:38 EDT Ventricular Rate:  72 PR Interval:  174 QRS Duration:  102 QT Interval:  414 QTC Calculation: 453 R Axis:   48  Text Interpretation: Normal sinus rhythm with sinus arrhythmia Nonspecific ST and T wave abnormality Confirmed by Vivienne Bruckner [phone]) on 01/23/2024 8:33:20 AM  - no acute changes.  Lab Results  Component Value Date   WBC 7.9 12/11/2023   HGB 14.4 12/11/2023   HCT 43.1 12/11/2023   MCV 102 (H) 12/11/2023   PLT 183 12/11/2023   Lab Results  Component Value Date   CREATININE 0.88 12/11/2023   BUN 8 12/11/2023   NA 141 12/11/2023   K 4.3 12/11/2023   CL 99 12/11/2023   CO2 26 12/11/2023   Lab Results  Component Value Date   ALT 24 11/17/2023   AST 47 (H) 11/17/2023   ALKPHOS 53 11/17/2023   BILITOT 0.6 11/17/2023   Lab Results  Component Value Date   CHOL 149 11/17/2023   HDL 45 11/17/2023   LDLCALC 54 11/17/2023   TRIG 249 (H) 11/17/2023   CHOLHDL 3.3 11/17/2023    Lab Results  Component Value Date   HGBA1C 6.1 (H) 11/14/2020   Lab Results  Component Value Date   TSH 2.375 11/14/2020       Assessment & Plan    1.  Chronic HFrEF/ICM:  Long h/o LV dysfxn in the  setting of severe multivessel coronary artery disease.  EF 35-40% by most recent echo in September.  He has chronic dyspnea on exertion with some fluctuation in volume status and weight based on his ability to take his torsemide , often determined by other activities he is required to do throughout the day.  He did not take his torsemide  yesterday weight is up about 2 pounds on his home scale and he does have right greater than left lower extremity swelling.  He will take his torsemide  when he gets home today and notes that  his dry weight typically runs around 193 to 195 pounds.  GDMT has historically been limited by intolerances and allergies.  He was allergic to Entresto  and efforts getting back on losartan  have been provided by soft blood pressures and more recently orthostatic hypotension, which he reports is ongoing, as he becomes lightheaded when he stands up.  Not previously felt to be a good candidate for an SGLT2 inhibitor due to recurrent pyelonephritis.  He otherwise remains on carvedilol  and spironolactone .  2.  Coronary artery disease: Status post CABG and redo  CABG with multiple interventions involving his grafts.  Most recent catheterization in July 2025 showed severe multivessel native coronary artery disease with new occlusion of the previously stented vein graft to the obtuse marginal.  The LIMA to the LAD and vein graft to the distal RCA remain patent.  He has chronic chest wall pain as well as exertional angina, which is lifestyle limiting.  He is on beta-blocker, nitrate, aspirin , statin, and Plavix .  No blood pressure room to titrate nitrates in the setting of history of orthostasis.  I am adding Ranexa  500 mg twice daily, which he had taken many years ago.  He is unsure as to why it was discontinued but historical notes from greater than 10 years ago indicate that it did help with his chest pain in the past.  He does have mild QT prolongation and I will follow-up an EKG next week.  3.  Primary hypertension: Blood pressure stable on beta-blocker, nitrate, ARB, and torsemide .  4.  Hyperlipidemia: LDL of 54 in July with normal ALT at that time.  He remains on statin therapy.  5.  Carotid arterial disease: Mild disease on prior carotid ultrasound.  He remains on aspirin , clopidogrel , and statin therapy.  6.  Disposition: Follow-up ECG in 1 week.  Follow-up in clinic in 2 months or sooner if necessary.  Lonni Meager, NP 01/23/2024, 10:43 AM

## 2024-01-26 ENCOUNTER — Ambulatory Visit: Admitting: Physician Assistant

## 2024-01-30 ENCOUNTER — Ambulatory Visit: Attending: Cardiovascular Disease | Admitting: *Deleted

## 2024-01-30 ENCOUNTER — Other Ambulatory Visit: Payer: Self-pay | Admitting: *Deleted

## 2024-01-30 VITALS — BP 100/60 | HR 72 | Ht 68.0 in | Wt 199.4 lb

## 2024-01-30 DIAGNOSIS — I25118 Atherosclerotic heart disease of native coronary artery with other forms of angina pectoris: Secondary | ICD-10-CM

## 2024-01-30 NOTE — Progress Notes (Signed)
    Nurse Visit   Date of Encounter: 01/30/2024 ID: Willie Clark, DOB Aug 13, 1950, MRN [id number]  PCP:  [name], NP   [facility] Providers Cardiologist:  Deatrice Cage, MD Cardiology APP:  Abigail Bernardino HERO, PA-C      Visit Details   VS:  There were no vitals taken for this visit. , BMI There is no height or weight on file to calculate BMI.  Wt Readings from Last 3 Encounters:  01/23/24 203 lb 4 oz (92.2 kg)  12/11/23 195 lb (88.5 kg)  12/08/23 192 lb 6.4 oz (87.3 kg)     Reason for visit: f/u EKG Performed today: EKG Vitals, consulted with Lonni Meager, NP Changes (medications, testing, etc.) : no changes Length of Visit: 30  minutes    Medications Adjustments/Labs and Tests Ordered: No orders of the defined types were placed in this encounter.  No orders of the defined types were placed in this encounter.    Signed, Harl Heron Manas, RN  01/30/2024 2:11 PM

## 2024-02-05 ENCOUNTER — Other Ambulatory Visit: Payer: Self-pay | Admitting: Cardiovascular Disease

## 2024-02-05 DIAGNOSIS — I5022 Chronic systolic (congestive) heart failure: Secondary | ICD-10-CM

## 2024-02-05 DIAGNOSIS — I10 Essential (primary) hypertension: Secondary | ICD-10-CM

## 2024-02-10 ENCOUNTER — Ambulatory Visit: Admitting: Dermatology

## 2024-02-10 DIAGNOSIS — L820 Inflamed seborrheic keratosis: Secondary | ICD-10-CM

## 2024-02-10 DIAGNOSIS — D692 Other nonthrombocytopenic purpura: Secondary | ICD-10-CM

## 2024-02-10 DIAGNOSIS — W908XXA Exposure to other nonionizing radiation, initial encounter: Secondary | ICD-10-CM

## 2024-02-10 DIAGNOSIS — L578 Other skin changes due to chronic exposure to nonionizing radiation: Secondary | ICD-10-CM | POA: Diagnosis not present

## 2024-02-10 DIAGNOSIS — L821 Other seborrheic keratosis: Secondary | ICD-10-CM

## 2024-02-10 NOTE — Patient Instructions (Addendum)

## 2024-02-10 NOTE — Progress Notes (Signed)
   Follow-Up Visit   Subjective  Willie Clark is a 73 y.o. male who presents for the following: Inflamed SK follow-up. He has other spots to treat today, irritated, picks at. Upper back with itchy spots.   The patient has spots, moles and lesions to be evaluated, some may be new or changing.    The following portions of the chart were reviewed this encounter and updated as appropriate: medications, allergies, medical history  Review of Systems:  No other skin or systemic complaints except as noted in HPI or Assessment and Plan.  Objective  Well appearing patient in no apparent distress; mood and affect are within normal limits.  A focused examination was performed of the following areas: Face, arms, legs  Relevant physical exam findings are noted in the Assessment and Plan.  upper back x 22, L ant thigh x 1, R upper paranasal x 1 (24) Erythematous stuck-on, waxy papule  Assessment & Plan   ACTINIC DAMAGE - chronic, secondary to cumulative UV radiation exposure/sun exposure over time - diffuse scaly erythematous macules with underlying dyspigmentation - Recommend daily broad spectrum sunscreen SPF 30+ to sun-exposed areas, reapply every 2 hours as needed.  - Recommend staying in the shade or wearing long sleeves, sun glasses (UVA+UVB protection) and wide brim hats (4-inch brim around the entire circumference of the hat). - Call for new or changing lesions.  Purpura - Chronic; persistent and recurrent.  Treatable, but not curable. - Violaceous macules and patches - Benign - Related to trauma, age, sun damage and/or use of blood thinners, chronic use of topical and/or oral steroids - Observe - Can use OTC arnica containing moisturizer such as Dermend Bruise Formula if desired - Call for worsening or other concerns   INFLAMED SEBORRHEIC KERATOSIS (24) upper back x 22, L ant thigh x 1, R upper paranasal x 1 (24) Symptomatic, irritating, patient would like treated.  Recheck  right upper paranasal on f/u- second treatment today  Continue clobetasol  cream spot treating aa prn itch.  Avoid applying to face, groin, and axilla. Use as directed. Long-term use can cause thinning of the skin.  Destruction of lesion - upper back x 22, L ant thigh x 1, R upper paranasal x 1 (24)  Destruction method: cryotherapy   Informed consent: discussed and consent obtained   Lesion destroyed using liquid nitrogen: Yes   Region frozen until ice ball extended beyond lesion: Yes   Outcome: patient tolerated procedure well with no complications   Post-procedure details: wound care instructions given   Additional details:  Prior to procedure, discussed risks of blister formation, small wound, skin dyspigmentation, or rare scar following cryotherapy. Recommend Vaseline ointment to treated areas while healing.     Return in about 6 months (around 08/10/2024) for UBSE, ISKs.  IAndrea Kerns, CMA, am acting as scribe for Rexene Rattler, MD .   Documentation: I have reviewed the above documentation for accuracy and completeness, and I agree with the above.  Rexene Rattler, MD

## 2024-02-21 ENCOUNTER — Other Ambulatory Visit: Payer: Self-pay | Admitting: Cardiovascular Disease

## 2024-03-11 ENCOUNTER — Other Ambulatory Visit: Payer: Self-pay | Admitting: Primary Care

## 2024-03-11 DIAGNOSIS — R109 Unspecified abdominal pain: Secondary | ICD-10-CM

## 2024-03-15 ENCOUNTER — Emergency Department
Admission: EM | Admit: 2024-03-15 | Discharge: 2024-03-15 | Disposition: A | Discharge status: Home / Self Care | Source: Ambulatory Visit | Attending: Emergency Medicine | Admitting: Emergency Medicine

## 2024-03-15 ENCOUNTER — Emergency Department

## 2024-03-15 ENCOUNTER — Other Ambulatory Visit: Payer: Self-pay

## 2024-03-15 ENCOUNTER — Encounter: Payer: Self-pay | Admitting: Emergency Medicine

## 2024-03-15 DIAGNOSIS — R4781 Slurred speech: Secondary | ICD-10-CM | POA: Diagnosis present

## 2024-03-15 DIAGNOSIS — W19XXXA Unspecified fall, initial encounter: Secondary | ICD-10-CM | POA: Diagnosis not present

## 2024-03-15 DIAGNOSIS — R079 Chest pain, unspecified: Secondary | ICD-10-CM | POA: Insufficient documentation

## 2024-03-15 DIAGNOSIS — R531 Weakness: Secondary | ICD-10-CM | POA: Diagnosis not present

## 2024-03-15 DIAGNOSIS — I4891 Unspecified atrial fibrillation: Secondary | ICD-10-CM | POA: Diagnosis not present

## 2024-03-15 DIAGNOSIS — R42 Dizziness and giddiness: Secondary | ICD-10-CM | POA: Insufficient documentation

## 2024-03-15 DIAGNOSIS — Z7901 Long term (current) use of anticoagulants: Secondary | ICD-10-CM | POA: Insufficient documentation

## 2024-03-15 HISTORY — DX: Type 2 diabetes mellitus without complications: E11.9

## 2024-03-15 LAB — COMPREHENSIVE METABOLIC PANEL WITH GFR
ALT: 31 U/L (ref 0–44)
AST: 54 U/L — ABNORMAL HIGH (ref 15–41)
Albumin: 4.1 g/dL (ref 3.5–5.0)
Alkaline Phosphatase: 161 U/L — ABNORMAL HIGH (ref 38–126)
Anion gap: 11 (ref 5–15)
BUN: 11 mg/dL (ref 8–23)
CO2: 31 mmol/L (ref 22–32)
Calcium: 9.4 mg/dL (ref 8.9–10.3)
Chloride: 89 mmol/L — ABNORMAL LOW (ref 98–111)
Creatinine, Ser: 1.12 mg/dL (ref 0.61–1.24)
GFR, Estimated: >60 mL/min (ref >60)
Glucose, Bld: 325 mg/dL — ABNORMAL HIGH (ref 70–99)
Potassium: 3.7 mmol/L (ref 3.5–5.1)
Sodium: 131 mmol/L — ABNORMAL LOW (ref 135–145)
Total Bilirubin: 0.7 mg/dL (ref 0.0–1.2)
Total Protein: 7 g/dL (ref 6.5–8.1)

## 2024-03-15 LAB — CBC
HCT: 37.8 % — ABNORMAL LOW (ref 39.0–52.0)
Hemoglobin: 12.7 g/dL — ABNORMAL LOW (ref 13.0–17.0)
MCH: 32.1 pg (ref 26.0–34.0)
MCHC: 33.6 g/dL (ref 30.0–36.0)
MCV: 95.5 fL (ref 80.0–100.0)
Platelets: 242 K/uL (ref 150–400)
RBC: 3.96 MIL/uL — ABNORMAL LOW (ref 4.22–5.81)
RDW: 13.9 % (ref 11.5–15.5)
WBC: 7.4 K/uL (ref 4.0–10.5)
nRBC: 0 % (ref 0.0–0.2)

## 2024-03-15 LAB — URINALYSIS, ROUTINE W REFLEX MICROSCOPIC
Bacteria, UA: NONE SEEN
Bilirubin Urine: NEGATIVE
Glucose, UA: >=500 mg/dL — AB
Hgb urine dipstick: NEGATIVE
Ketones, ur: NEGATIVE mg/dL
Leukocytes,Ua: NEGATIVE
Nitrite: NEGATIVE
Protein, ur: NEGATIVE mg/dL
RBC / HPF: 0 RBC/hpf (ref 0–5)
Specific Gravity, Urine: 1.023 (ref 1.005–1.030)
Squamous Epithelial / HPF: 0 /HPF (ref 0–5)
pH: 7 (ref 5.0–8.0)

## 2024-03-15 LAB — TROPONIN T, HIGH SENSITIVITY
Troponin T High Sensitivity: 23 ng/L — ABNORMAL HIGH (ref 0–19)
Troponin T High Sensitivity: 20 ng/L — ABNORMAL HIGH (ref 0–19)

## 2024-03-15 LAB — CBG MONITORING, ED: Glucose-Capillary: 308 mg/dL — ABNORMAL HIGH (ref 70–99)

## 2024-03-15 MED ORDER — ONDANSETRON HCL 4 MG/2ML IJ SOLN: 4.0000 mg | Freq: Once | INTRAMUSCULAR | Status: DC

## 2024-03-15 MED ORDER — SODIUM CHLORIDE 0.9 % IV BOLUS
1000.0000 mL | Freq: Once | INTRAVENOUS | Status: AC
  Administered 2024-03-15: 1000 mL via INTRAVENOUS

## 2024-03-15 NOTE — ED Provider Notes (Signed)
 [facility] Provider Note    Event Date/Time   First MD Initiated Contact with Patient 03/15/24 1357     (approximate)   History   Fall   HPI  Willie Clark is a 73 y.o. male decease PE, Barrett's esophagus, diabetes presents emergency department complaining of slurred speech that started 2 days ago, his balance is off, glucose has been elevated, difficulty swallowing when he drinks liquids, some chest pain, was at his doctor's office and they told him he had a extra beat on his EKG.  Denies fever, chills, vomiting, sweating.  Patient did have a fall 2 days ago while he was standing at the counter.  Is on a blood thinner      Physical Exam   Triage Vital Signs: ED Triage Vitals [03/15/24 1215]  Encounter Vitals Group     BP 131/84     Girls Systolic BP Percentile      Girls Diastolic BP Percentile      Boys Systolic BP Percentile      Boys Diastolic BP Percentile      Pulse Rate 67     Resp 16     Temp 99.5 F (37.5 C)     Temp Source Oral     SpO2 95 %     Weight 199 lb 4.7 oz (90.4 kg)     Height      Head Circumference      Peak Flow      Pain Score 0     Pain Loc      Pain Education      Exclude from Growth Chart     Most recent vital signs: Vitals:   03/15/24 1600 03/15/24 1659  BP: 136/72   Pulse: 85   Resp: 18   Temp:  98.1 F (36.7 C)  SpO2: 97%      General: Awake, no distress.   CV:  Good peripheral perfusion. regular rate and  rhythm Resp:  Normal effort. Lungs cta Abd:  No distention.   Other:  Cranial nerves II through XII grossly intact, patient has clear speech, grips equal bilaterally, right elbow nontender   ED Results / Procedures / Treatments   Labs (all labs ordered are listed, but only abnormal results are displayed) Labs Reviewed  COMPREHENSIVE METABOLIC PANEL WITH GFR - Abnormal; Notable for the following components:      Result Value   Sodium 131 (*)    Chloride 89 (*)    Glucose, Bld 325 (*)     AST 54 (*)    Alkaline Phosphatase 161 (*)    All other components within normal limits  CBC - Abnormal; Notable for the following components:   RBC 3.96 (*)    Hemoglobin 12.7 (*)    HCT 37.8 (*)    All other components within normal limits  URINALYSIS, ROUTINE W REFLEX MICROSCOPIC - Abnormal; Notable for the following components:   Color, Urine YELLOW (*)    APPearance CLEAR (*)    Glucose, UA >=500 (*)    All other components within normal limits  CBG MONITORING, ED - Abnormal; Notable for the following components:   Glucose-Capillary 308 (*)    All other components within normal limits  TROPONIN T, HIGH SENSITIVITY - Abnormal; Notable for the following components:   Troponin T High Sensitivity 23 (*)    All other components within normal limits  TROPONIN T, HIGH SENSITIVITY - Abnormal; Notable for the following components:  Troponin T High Sensitivity 20 (*)    All other components within normal limits     EKG  EKG   RADIOLOGY cxr x-ray, CT head    PROCEDURES:   Procedures  Critical Care:  no Chief Complaint  Patient presents with   Fall      MEDICATIONS ORDERED IN ED: Medications  sodium chloride  0.9 % bolus 1,000 mL (0 mLs Intravenous Stopped 03/15/24 1658)     IMPRESSION / MDM / ASSESSMENT AND PLAN / ED COURSE  I reviewed the triage vital signs and the nursing notes.                              Differential diagnosis includes, but is not limited to, CVA, SAH, MI, NSTEMI, hyperglycemia, DKA, weakness  Patient's presentation is most consistent with acute presentation with potential threat to life or bodily function.    Medications given: Normal saline 1 L IV, Zofran  4 mg IV  Labs, imaging ordered   EKG shows A-fib with QT prolongation, 92 bpm, see physician read, interpret this as being abnormal for A-fib and QT prolongation  Patient's labs other than his glucose are reassuring, patient's glucose went from 325-308 following fluids.  CT  of the head, independent review interpretation by me as being negative for any acute abnormality  Chest x-ray, independent review interpretation of x-rays negative for any acute abnormality  MRI of the brain secondary strokelike symptoms  Still awaiting MRI, care transferred to Dr. Arlander at shift change   FINAL CLINICAL IMPRESSION(S) / ED DIAGNOSES   Final diagnoses:  Fall, initial encounter  Stroke-like symptom     Rx / DC Orders   ED Discharge Orders     None        Note:  This document was prepared using Dragon voice recognition software and may include unintentional dictation errors.    Gasper Devere ORN, PA-C 03/15/24 1900    Arlander [place], MD 03/15/24 [phone]

## 2024-03-15 NOTE — ED Notes (Signed)
 Patient transported to CT

## 2024-03-15 NOTE — ED Triage Notes (Addendum)
 Sent form PCP office for ED evaluation for generalized weakness, nausea, intermittent chest pain and dizziness and fall on 11/22.  Patient states symptoms have been ongoing x 1 month.  States patient just fell while standing in front of the refrigerator and just fell, hitting right elbow. Denies head injury.  AAOx3. Skin warm and dry> NAD  Also reports that blood glucose, from lab work on Wednesday, >500. Diagnosed with DM II on Saturday. Started insulin SQ on Saturday.

## 2024-03-24 ENCOUNTER — Encounter: Payer: Self-pay | Admitting: Nurse Practitioner

## 2024-03-24 ENCOUNTER — Ambulatory Visit: Attending: Nurse Practitioner | Admitting: Nurse Practitioner

## 2024-03-24 VITALS — BP 98/50 | HR 81 | Ht 68.5 in | Wt 205.5 lb

## 2024-03-24 DIAGNOSIS — I5022 Chronic systolic (congestive) heart failure: Secondary | ICD-10-CM | POA: Diagnosis not present

## 2024-03-24 DIAGNOSIS — E782 Mixed hyperlipidemia: Secondary | ICD-10-CM | POA: Diagnosis not present

## 2024-03-24 DIAGNOSIS — Z9861 Coronary angioplasty status: Secondary | ICD-10-CM

## 2024-03-24 DIAGNOSIS — I493 Ventricular premature depolarization: Secondary | ICD-10-CM

## 2024-03-24 DIAGNOSIS — I10 Essential (primary) hypertension: Secondary | ICD-10-CM | POA: Diagnosis not present

## 2024-03-24 DIAGNOSIS — I2510 Atherosclerotic heart disease of native coronary artery without angina pectoris: Secondary | ICD-10-CM

## 2024-03-24 NOTE — Progress Notes (Signed)
 Office Visit    Patient Name: Willie Clark Date of Encounter: 03/24/2024  Primary Care Provider:  [name], NP Primary Cardiologist:  Willie Cage, MD  Cardiology APP:  Willie Clark Willie DEVONNA Vivienne Lonni Tanda, NP   Chief Complaint    73 y.o. 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   Past Medical History   Subjective   Past Medical History:  Diagnosis Date   AAA (abdominal aortic aneurysm) 02/23/2020   a.) AAA duplex 02/23/2020: measured 3.0 cm   Acute coronary syndrome (HCC)    Anxiety    Aortic atherosclerosis    Arthritis    ASD (atrial septal defect) 01/20/2006   a.) noted on intraoperative TEE; s/p closure   Barrett esophagus    Barrett's esophagus    Bilateral leg edema    Bilateral occipital neuralgia    Bowel obstruction (HCC)    Carpal tunnel syndrome    Chronic chest pain    Chronic HFrEF (heart failure with reduced ejection fraction) (HCC)    a. 12/2023 Echo: EF 35-40%, glob HK, GrI DD, mildly reduced RV fxn, mild MR/AI.   COPD (chronic obstructive pulmonary disease) (HCC)    Coronary artery disease    a. CABG 93; Redo 07; b. LHC 09/2015, 11/2019: Multivessel disease, patent grafts; c. LHC 06/2020: 80-99 VG-RCA (DES x3); d. R/LHC 07/22: 75 (ost) & 60 (mid) VG-OM (DES x2); e. 10/2023 Cath: Multivessel CAD with occlusion of the vein graft to the obtuse marginal.  LIMA to LAD patent, vein graft to the distal RCA patent.  Medical therapy.   Depression    Diabetes mellitus without complication (HCC)    Diverticulitis    Dyshidrosis    Dysphagia    Enlarged prostate without lower urinary tract symptoms (luts)    Erectile dysfunction     Fibromyalgia    Gastric ulcer    Gastritis    GERD (gastroesophageal reflux disease)    Headache    Hemorrhoids    HFrEF (heart failure with reduced ejection fraction) (HCC)    a.) 09/2016 Echo: EF 55-60%, no rwma, triv AI, mild MR, mildly dil LA. -> as of April 2020: EF 40 to 45%.  Septal dyssynergy/hypokinesis due to postop state but otherwise unable to assess wall motion Willie Clark due to poor study. b.) TTE 12/12/2019: EF 40-45%, LAE, mild AR, G1DD; c.) TTE 06/20/2020: EF 35-40%, sev inferolateral HK, mild AR, G1DD.   History of BPH    History of kidney stones    History of myocardial infarct at age less than 60 years    Hyperlipidemia    Hypertension    Hyponatremia    Idiopathic urticaria    Intermittent claudication    Ischemic cardiomyopathy    Kidney stones    Left ventricular failure (HCC)    Lightheadedness    Liver disease    Long term current use of antithrombotics/antiplatelets    a.) daily DAPT therapy (ASA + clopidogrel )   Mass on back    Migraine without aura    Myocardial infarction (HCC) 1993   Nail dystrophy    Nephrolithiasis    Obstructive uropathy    Orthostatic hypotension    a. Improved after  discontinuation of metoprolol .   OSA on CPAP    Peripheral neuropathy    Peripheral neuropathy    Peyronie disease    Postoperative atrial fibrillation/flutter (HCC) 01/20/2006   a.) POD2 following re-do CABG; Tx'd with IV amiodarone, which did not convert; EF 20% on POD3; DCCV (100 J x 1) POD4.   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   a.) 2v CABG: LIMA-LAD, SVG-D2   S/P cervical spinal fusion    S/P RE-DO CABG x 2 01/20/2006   a.) 2v REDO CABG: reserse SVG-PDA, SVG-OM1   Sinus bradycardia    Skin lesions, generalized    Small bowel obstruction (HCC) 02/2009   ST elevation myocardial infarction (STEMI) of inferior wall (HCC) 06/2020   a.) LHC 06/2020 --> subtotally  occluded SVG-RCA with long segment of proximal graft disease and 99% stenosis at the distal anastomosis --> successful PCI to SVG to RCA with overlapping Resolute Onyx DES x 3   Stroke [facility]) 1993   right side weakness no blood thinners   on Aspirin    Syncope and collapse    Synovial cyst of lumbar facet joint    TIA (transient ischemic attack) 08/13/2014   Urticaria    Past Surgical History:  Procedure Laterality Date   BACK SURGERY     BIOPSY  04/22/2023   Procedure: BIOPSY;  Surgeon: Maryruth Ole DASEN, MD;  Location: [facility] ENDOSCOPY;  Service: Endoscopy;;  Gastric and Colonic   bowel obstruction  01/2009   CARDIAC CATHETERIZATION  11/2006   patent grafts. Significant OM3 disease and occluded diagonals.   CARDIAC CATHETERIZATION  06/2010   patent grafts. significant ISR in proximal LCX but OM2 is bypassed and gives retrograde flow to OM3.    CARDIAC CATHETERIZATION  06/2013   [facility]: Patent grafts with 60% proximal in-stent restenosis in the left circumflex with FFR of 0.85   CARDIAC CATHETERIZATION Left 09/25/2015   Procedure: Left Heart Cath and Cors/Grafts Angiography;  Surgeon: Willie DELENA Cage, MD;  Location: [facility] INVASIVE CV LAB;  Service: Cardiovascular;  Laterality: Left;   CATARACT EXTRACTION     CERVICAL FUSION     CHOLECYSTECTOMY     COLON SURGERY  01/2009   BOWEL RESECTION DUE TO SMALL BOWEL OBSTRUCTION   COLONOSCOPY     COLONOSCOPY WITH PROPOFOL  N/A 10/28/2017   Procedure: COLONOSCOPY WITH PROPOFOL ;  Surgeon: Gaylyn Gladis PENNER, MD;  Location: [facility] ENDOSCOPY;  Service: Endoscopy;  Laterality: N/A;   COLONOSCOPY WITH PROPOFOL  N/A 07/30/2021   Procedure: COLONOSCOPY WITH PROPOFOL ;  Surgeon: Maryruth Ole DASEN, MD;  Location: [facility] ENDOSCOPY;  Service: Endoscopy;  Laterality: N/A;   COLONOSCOPY WITH PROPOFOL  N/A 04/22/2023   Procedure: COLONOSCOPY WITH PROPOFOL ;  Surgeon: Maryruth Ole DASEN, MD;  Location: [facility] ENDOSCOPY;  Service: Endoscopy;  Laterality: N/A;   CORONARY  ANGIOPLASTY  2006   CORONARY ARTERY BYPASS GRAFT  1993/01/2006   redo at [facility]. LIMA to LAD, SVG to OM2 and SVG to RPDA   CORONARY STENT INTERVENTION N/A 06/20/2020   Procedure: CORONARY STENT INTERVENTION;  Surgeon: Mady Bruckner, MD;  Location: [facility] INVASIVE CV LAB;  Service: Cardiovascular;  Laterality: N/A;   CORONARY STENT INTERVENTION N/A 11/15/2020   Procedure: CORONARY STENT INTERVENTION;  Surgeon: Mady Bruckner, MD;  Location: [facility] INVASIVE CV LAB;  Service: Cardiovascular;  Laterality: N/A;   CYSTOSCOPY WITH STENT PLACEMENT Bilateral 09/24/2015   Procedure: CYSTOSCOPY WITH BILATERAL  RETROGRADES, BILATERAL STENT PLACEMENT;  Surgeon: Donnice Brooks, MD;  Location: [facility] ORS;  Service: Urology;  Laterality: Bilateral;   ESOPHAGOGASTRODUODENOSCOPY N/A 10/25/2014   Procedure: ESOPHAGOGASTRODUODENOSCOPY (EGD);  Surgeon: Gladis RAYMOND Mariner, MD;  Location: [facility] ENDOSCOPY;  Service: Endoscopy;  Laterality: N/A;   ESOPHAGOGASTRODUODENOSCOPY (EGD) WITH PROPOFOL  N/A 02/03/2017   Procedure: ESOPHAGOGASTRODUODENOSCOPY (EGD) WITH PROPOFOL ;  Surgeon: Mariner Gladis RAYMOND, MD;  Location: [facility] ENDOSCOPY;  Service: Endoscopy;  Laterality: N/A;   ESOPHAGOGASTRODUODENOSCOPY (EGD) WITH PROPOFOL  N/A 04/18/2017   Procedure: ESOPHAGOGASTRODUODENOSCOPY (EGD) WITH PROPOFOL ;  Surgeon: Mariner Gladis RAYMOND, MD;  Location: [facility] ENDOSCOPY;  Service: Endoscopy;  Laterality: N/A;   ESOPHAGOGASTRODUODENOSCOPY (EGD) WITH PROPOFOL  N/A 04/22/2023   Procedure: ESOPHAGOGASTRODUODENOSCOPY (EGD) WITH PROPOFOL ;  Surgeon: Maryruth Ole DASEN, MD;  Location: [facility] ENDOSCOPY;  Service: Endoscopy;  Laterality: N/A;   EYE SURGERY Bilateral    Cataract Extraction with IOL   HERNIA REPAIR     INGUINAL HERNIA REPAIR Bilateral 01/23/2016   Procedure: HERNIA REPAIR INGUINAL ADULT BILATERAL;  Surgeon: Larinda Unknown Sharps, MD;  Location: [facility] ORS;  Service: General;  Laterality: Bilateral;   LEFT HEART CATH AND CORS/GRAFTS ANGIOGRAPHY  N/A 12/13/2019   Procedure: LEFT HEART CATH AND CORS/GRAFTS ANGIOGRAPHY;  Surgeon: Darron Willie LABOR, MD;  Location: [facility] INVASIVE CV LAB;  Service: Cardiovascular;  Laterality: N/A;   LEFT HEART CATH AND CORS/GRAFTS ANGIOGRAPHY N/A 06/20/2020   Procedure: LEFT HEART CATH AND CORS/GRAFTS ANGIOGRAPHY;  Surgeon: Mady Bruckner, MD;  Location: [facility] INVASIVE CV LAB;  Service: Cardiovascular;  Laterality: N/A;   LEFT HEART CATH AND CORS/GRAFTS ANGIOGRAPHY N/A 11/18/2023   Procedure: LEFT HEART CATH AND CORS/GRAFTS ANGIOGRAPHY;  Surgeon: Darron Willie LABOR, MD;  Location: [facility] INVASIVE CV LAB;  Service: Cardiovascular;  Laterality: N/A;   LUMBAR LAMINECTOMY/DECOMPRESSION MICRODISCECTOMY Left 10/01/2016   Procedure: Left Lumbar four-five Laminotomy for resection of synovial cyst;  Surgeon: Unice Pac, MD;  Location: [facility] OR;  Service: Neurosurgery;  Laterality: Left;  left   NECK SURGERY  06/2009   POLYPECTOMY  04/22/2023   Procedure: POLYPECTOMY;  Surgeon: Maryruth Ole DASEN, MD;  Location: [facility] ENDOSCOPY;  Service: Endoscopy;;  Colon   RIGHT/LEFT HEART CATH AND CORONARY/GRAFT ANGIOGRAPHY N/A 11/15/2020   Procedure: RIGHT/LEFT HEART CATH AND CORONARY/GRAFT ANGIOGRAPHY;  Surgeon: Mady Bruckner, MD;  Location: [facility] INVASIVE CV LAB;  Service: Cardiovascular;  Laterality: N/A;   SHOULDER SURGERY Bilateral 2010    Allergies  Allergies  Allergen Reactions   Amitriptyline Other (See Comments)    Pt unsure of what happens Other reaction(s): Other (See Comments) Pt unsure of what happens Pt unsure of what happens   Amoxicillin -Pot Clavulanate Itching   Cyclobenzaprine Hives and Other (See Comments)    Pt unsure what hapens   Dicyclomine  Shortness Of Breath and Swelling   Sacubitril-Valsartan Anaphylaxis and Itching    Other Reaction(s): anaphylaxis after starting, required epi at urgent care PRESUMED DUE TO sacubitril since pt tolerated losartan  previously await cardiology recommendations re: ARB    Icosapent  Ethyl (Epa Ethyl Ester) (Fish) Other (See Comments)    tongue felt slighlty swollen/different after starting   Amitriptyline Hcl    [employer] Agent (Non-Screening) Diarrhea   Hydrocodone     Fish Oil Itching   Lidocaine  Nausea And Vomiting, Other (See Comments) and Nausea Only    Nasal Spray (only time had reaction) Other reaction(s): Dizziness Nasal Spray (only time had reaction)   Omega-3 Fatty Acids-Vitamin E Itching   Other Diarrhea and Itching    Reactive agents: Onions, bacon, steak  Oxycodone -Acetaminophen  Other (See Comments)    Lowers BP Other reaction(s): Other (See Comments) Lowers BP Lowers BP   Sulfa Antibiotics Itching       History of Present Illness      73 y.o. 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  Regarding his CAD, he underwent CABG in 1993 with redo CABG in 2007.  He was admitted in 11/2019 with chest pain.  Echo showed an EF of 40 to 45%.  LHC showed severe underlying three-vessel coronary arteries with patent grafts and no significant change since 2017, and he was medically managed with GDMT optimization.  He was switched to Entresto  at 1 point but this was discontinued due to allergic reaction (rash/itching) that required ED evaluation.  In March 2022, he was evaluated in the emergency department with unstable angina and ECG worrisome for inferior ST segment elevation.  He underwent emergent diagnostic catheterization showing severe native three-vessel disease with patent circumflex stent, moderate disease in the vein graft to the OM 2, and subtotally occluded vein graft to the right coronary artery with 99% stenosis at the distal anastomosis, which was successfully treated with  a drug-eluting stent.  Echo during admission showed EF of 35 to 40%.  He required repeat admission and diagnostic catheterization in July 2022 showing progression of disease and vein graft to the obtuse marginal and this was treated with 2 drug-eluting stents.  Echo in  February 2023 showed persistent mild LV dysfunction with an EF of 45 to 50%.  Stress testing March 2023 with showed inferolateral ischemia but was overall low to moderate risk and he was managed medically.  In June 2025, he was admitted severe sepsis in the setting of acute pyelonephritis complicated by recurrent nephrolithiasis and Enterobacterales bacteremia.  In July 2025 he reported worsening exertional dyspnea and angina underwent diagnostic catheterization revealing severe underlying three-vessel CAD with interval occlusion of the vein graft to the obtuse marginal, patent LIMA to LAD, patent vein graft to the RCA, and occlusion of the vein graft to the obtuse marginal, which was felt to be the culprit for increased angina and medical therapy was recommended.  Echocardiogram in September 2025 showed EF of 35 to 40% with grade 1 diastolic dysfunction, mildly reduced RV function, and mild MR/AI.    In October 2025 for office visit, Mr. Jobe reported ongoing angina.  We added Ranexa  500 mg twice daily.  On November 24, he was seen in the emergency department following a fall, where he said his legs just gave out.  He was found to be hyperglycemic with a glucose of 325.  Sodium was low at 131.  Troponin was mildly elevated at 23.  CT of the head was unremarkable and MRI of the brain also showed no acute abnormality.  He was subsequently discharged.  He says that over the past 2 weeks (preceding ER visit), he has been experiencing congestion and cough.  No fevers.  He saw primary care yesterday was prescribed cough medicine to take at night.  His angina has overall improved since starting Ranexa , though he has been having chest wall pain  associated with cough.  He has chronic dyspnea on exertion which has worsened over the past 2 weeks.  In addition, his weight is up  8 pounds over the past 2 days.  He misses doses of torsemide  at least 2 days a week related to other appointments and not wanting to have to urinate while traveling.  He he has not taken torsemide  in the past 48 hours.  He has had some increased lower extremity swelling.  He denies palpitations, PND, orthopnea, dizziness, syncope, or early satiety. Objective   Home Medications    Current Outpatient Medications  Medication Sig Dispense Refill   acetaminophen  (TYLENOL ) 500 MG tablet Take 1,000 mg by mouth every 6 (six) hours as needed for mild pain.     albuterol  (VENTOLIN  HFA) 108 (90 Base) MCG/ACT inhaler Inhale 1 puff into the lungs as needed.     aspirin  EC 81 MG EC tablet Take 1 tablet (81 mg total) by mouth daily. Swallow whole. 30 tablet 11   benzonatate (TESSALON) 100 MG capsule Take 100 mg by mouth 3 (three) times daily as needed for cough.     carvedilol  (COREG ) 3.125 MG tablet TAKE 1 TABLET (3.125 MG TOTAL) BY MOUTH 2 (TWO) TIMES DAILY WITH A MEAL. 180 tablet 3   citalopram  (CELEXA ) 20 MG tablet Take 20 mg by mouth daily.      clobetasol  cream (TEMOVATE ) 0.05 % APPLY TO ITCHY AREAS ON ELBOWS TWICE A DAY UNTIL IMPROVED. AVOID FACE, GROIN, UNDERARMS. 30 g 1   clopidogrel  (PLAVIX ) 75 MG tablet Take 1 tablet (75 mg total) by mouth daily. 90 tablet 3   EPINEPHrine  0.3 mg/0.3 mL IJ SOAJ injection Inject 0.3 mg into the muscle as needed.     fexofenadine (ALLEGRA) 180 MG tablet Take 180 mg by mouth daily.     finasteride  (PROSCAR ) 5 MG tablet Take 5 mg by mouth daily.     gabapentin  (NEURONTIN ) 400 MG capsule Take 400-800 mg by mouth 3 (three) times daily.     guaiFENesin-codeine 100-10 MG/5ML syrup Take 5 mLs by mouth at bedtime.     isosorbide  mononitrate (IMDUR ) 60 MG 24 hr tablet TAKE 1 TABLET EVERY DAY 90 tablet 3   JARDIANCE  10 MG TABS tablet Take 10 mg by  mouth daily.     LANTUS SOLOSTAR 100 UNIT/ML Solostar Pen Inject 12 Units into the skin daily.     LINZESS 72 MCG capsule Take 72 mcg by mouth daily before breakfast.     Multiple Vitamins-Minerals (HAIR SKIN AND NAILS FORMULA PO) Take 1 capsule by mouth daily.     nitroGLYCERIN  (NITROSTAT ) 0.4 MG SL tablet Place 1 tablet (0.4 mg total) under the tongue every 5 (five) minutes as needed for chest pain. 90 tablet 1   ondansetron  (ZOFRAN -ODT) 4 MG disintegrating tablet Take 4 mg by mouth every 8 (eight) hours as needed.     pantoprazole  (PROTONIX ) 40 MG tablet Take 40 mg by mouth 2 (two) times daily.      Probiotic Product (PROBIOTIC PO) Take by mouth daily at 8 pm.     Psyllium (DAILY FIBER PO) Take 1 tablet by mouth daily.     ranolazine  (RANEXA ) 500 MG 12 hr tablet Take 1 tablet (500 mg total) by mouth 2 (two) times daily. 60 tablet 3   rOPINIRole  (REQUIP ) 1 MG tablet Take 1 mg by mouth 2 (two) times daily.     rosuvastatin  (CRESTOR ) 40 MG tablet Take 1 tablet (40 mg total) by mouth daily. 90 tablet 0   spironolactone  (ALDACTONE ) 25 MG tablet TAKE 1 TABLET EVERY DAY 90 tablet 0   torsemide  (DEMADEX ) 20 MG tablet  TAKE 2 TABLETS EVERY DAY 180 tablet 0   traMADol  (ULTRAM ) 50 MG tablet Take 1 tablet (50 mg total) by mouth every 6 (six) hours as needed for up to 15 doses for severe pain or moderate pain. 15 tablet 0   vitamin B-12 (CYANOCOBALAMIN ) 1000 MCG tablet Take 1,000 mcg by mouth daily.     [Paused] potassium chloride  SA (KLOR-CON  M) 20 MEQ tablet Take 1 tablet (20 mEq total) by mouth daily. (Patient not taking: Reported on 03/24/2024) 90 tablet 0   No current facility-administered medications for this visit.   Facility-Administered Medications Ordered in Other Visits  Medication Dose Route Frequency Provider Last Rate Last Admin   indocyanine green  (IC-GREEN ) injection 5 mg  5 mg Intravenous Once Sakai, Isami, DO         Physical Exam    VS:  BP (!) 98/50 (BP Location: Left Arm, Patient  Position: Sitting, Cuff Size: Normal)   Pulse 81   Ht 5' 8.5 (1.74 m)   Wt 205 lb 8 oz (93.2 kg)   SpO2 98%   BMI 30.79 kg/m  , BMI Body mass index is 30.79 kg/m.          GEN: Well nourished, well developed, in no acute distress. HEENT: normal. Neck: Supple, no JVD, carotid bruits, or masses. Cardiac: RRR, no murmurs, rubs, or gallops. No clubbing, cyanosis, trace bilateral ankle edema.  Radials 2+/PT 2+ and equal bilaterally.  Respiratory:  Respirations regular and unlabored, clear to auscultation bilaterally. GI: Firm, protuberant, nontender, BS + x 4. MS: no deformity or atrophy. Skin: warm and dry, no rash. Neuro:  Strength and sensation are intact. Psych: Normal affect.  Accessory Clinical Findings    ECG personally reviewed by me today -    Sinus rhythm, 81, ventricular bigeminy, nonspecific ST/T changes- no acute changes.  Follow-up rhythm strip shows sinus rhythm without PVCs  Lab Results  Component Value Date   WBC 7.4 03/15/2024   HGB 12.7 (L) 03/15/2024   HCT 37.8 (L) 03/15/2024   MCV 95.5 03/15/2024   PLT 242 03/15/2024   Lab Results  Component Value Date   CREATININE 1.12 03/15/2024   BUN 11 03/15/2024   NA 131 (L) 03/15/2024   K 3.7 03/15/2024   CL 89 (L) 03/15/2024   CO2 31 03/15/2024   Lab Results  Component Value Date   ALT 31 03/15/2024   AST 54 (H) 03/15/2024   ALKPHOS 161 (H) 03/15/2024   BILITOT 0.7 03/15/2024   Lab Results  Component Value Date   CHOL 149 11/17/2023   HDL 45 11/17/2023   LDLCALC 54 11/17/2023   TRIG 249 (H) 11/17/2023   CHOLHDL 3.3 11/17/2023    Lab Results  Component Value Date   HGBA1C 6.1 (H) 11/14/2020   Lab Results  Component Value Date   TSH 2.375 11/14/2020       Assessment & Plan    1.  Chronic heart failure with reduced ejection fraction/ischemic cardiomyopathy: Long history of LV dysfunction in the setting of severe multivessel CAD.  EF 35 to 40% by most recent echo in September 2025.  He seems  to be missing his torsemide  several days a week as he notes that he would prefer to take it if he has appointments in the morning.  He has not taken in the past 2 days and his weight is up 8 pounds above his dry weight at home, which typically runs 193 to 195 pounds on his home scale.  He has been experiencing congestion and cough over the past 2 weeks as well, and his baseline dyspnea on exertion is slightly worse.  He is volume overloaded on exam with abdominal distention and trace lower extremity edema.  I stressed the importance of compliance with the medications and he plans to go home and take 40 mg of torsemide  this morning in addition to his carvedilol  and spironolactone .  GDMT remains limited by intolerances, allergies, and soft blood pressures (98/50 today).  Previously allergic to Entresto  and not on ARB due to soft blood pressures.  Also, not a good candidate for an SGLT2 inhibitor due to history of recurrent pyelonephritis.  2.  Coronary artery disease: Status post CABG and redo CABG multiple interventions involving his grafts.  Most recent catheterization July 2025 showed severe multivessel disease with new occlusion of the previously stented vein graft to the obtuse marginal.  LIMA to the LAD and vein graft to the distal RCA remained patent.  Since adding Ranexa  to his regimen in October, he has noted an improvement in angina though he has chronic chest wall pain, exacerbated by recent coughing.  QTc is stable today on Ranexa .  He otherwise remains on aspirin , Plavix , beta-blocker, nitrate, and statin therapy.  3.  PVCs/bigeminy: Noted on initial ECG today though follow-up rhythm strip shows sinus rhythm without any PVCs.  Encouraged to take his beta-blocker, which he has not done yet this morning.  4.  Primary hypertension: Persistently soft on beta-blocker, nitrate, and diuretic therapy.  5.  Hyperlipidemia: LDL 54 July.  Mild AST elevation with normal ALT on recent lab work.  He remains on  rosuvastatin  therapy.  6.  Carotid arterial disease: Mild disease on prior carotid ultrasound.  He remains on aspirin , clopidogrel , and statin therapy.  7.  Cough/congestion: Evaluated by primary care yesterday now on cough medicine.  Lungs are clear on examination and recent chest x-ray November 24 showed no active disease.  8.  Disposition: Patient strongly encouraged to take medicines as directed and follow-up in 4 to 6 weeks.   Lonni Meager, NP 03/24/2024, 9:41 AM

## 2024-03-24 NOTE — Patient Instructions (Signed)
 Medication Instructions:  Your physician recommends that you continue on your current medications as directed. Please refer to the Current Medication list given to you today.   Please take your torsemide  when returning home today and then continue daily. *If you need a refill on your cardiac medications before your next appointment, please call your pharmacy*  Lab Work: No labs ordered today    Testing/Procedures: No test ordered today   Follow-Up: At [facility], you and your health needs are our priority.  As part of our continuing mission to provide you with exceptional heart care, our providers are all part of one team.  This team includes your primary Cardiologist (physician) and Advanced Practice Providers or APPs (Physician Assistants and Nurse Practitioners) who all work together to provide you with the care you need, when you need it.  Your next appointment:   3 to 4 week(s)  Provider:   Deatrice Cage, MD or Lonni Meager, NP

## 2024-03-29 ENCOUNTER — Ambulatory Visit: Admission: RE | Admit: 2024-03-29 | Discharge: 2024-03-29 | Attending: Primary Care

## 2024-03-29 DIAGNOSIS — R109 Unspecified abdominal pain: Secondary | ICD-10-CM

## 2024-04-17 ENCOUNTER — Other Ambulatory Visit: Payer: Self-pay | Admitting: Nurse Practitioner

## 2024-04-19 ENCOUNTER — Other Ambulatory Visit: Payer: Self-pay | Admitting: Physician Assistant

## 2024-04-19 DIAGNOSIS — I5022 Chronic systolic (congestive) heart failure: Secondary | ICD-10-CM

## 2024-04-19 DIAGNOSIS — I10 Essential (primary) hypertension: Secondary | ICD-10-CM

## 2024-04-27 ENCOUNTER — Telehealth: Payer: Self-pay | Admitting: Cardiovascular Disease

## 2024-04-27 NOTE — Telephone Encounter (Signed)
 Per preop APP Rosabel ORN. NP pt has appt 05/06/24 with Dr. Darron at which time preop clearance can be addressed.

## 2024-04-27 NOTE — Telephone Encounter (Signed)
"  ° °  Pre-operative Risk Assessment    Patient Name: Willie Clark  DOB: 10-12-1950 MRN: [id number]   Date of last office visit: 03/24/24 Date of next office visit: 05/06/24   Request for Surgical Clearance    Procedure:  Cervical / lumbar injections  Date of Surgery:  Clearance TBD                                Surgeon:  Dr. Darlis Socks Group or Practice Name:  [facility] Phone number:  [phone] Fax number:  [phone]   Type of Clearance Requested:   - Pharmacy:  Hold Clopidogrel  (Plavix ) 75 mg tab 7 days prior   Type of Anesthesia:  Not Indicated   Additional requests/questions:    Willie Clark   04/27/2024, 8:39 AM   "

## 2024-04-27 NOTE — Telephone Encounter (Signed)
" ° °  Name: Willie Clark  DOB: [birth year]  MRN: [id number]  Primary Cardiologist: Deatrice Cage, MD  Chart reviewed as part of pre-operative protocol coverage. The patient has an upcoming visit scheduled with Dr. Cage on 05/06/24 at which time clearance can be addressed in case there are any issues that would impact surgical recommendations.  Cervical/lumbar injections is not scheduled as below. I added preop FYI to appointment note so that provider is aware to address at time of outpatient visit.  Per office protocol the cardiology provider should forward their finalized clearance decision and recommendations regarding antiplatelet therapy to the requesting party below.    I will route this message as FYI to requesting party and remove this message from the preop box as separate preop APP input not needed at this time.   Please call with any questions.  Ridley Schewe D Amario Longmore, NP  04/27/2024, 8:53 AM   "

## 2024-05-06 ENCOUNTER — Encounter: Payer: Self-pay | Admitting: Cardiovascular Disease

## 2024-05-06 ENCOUNTER — Ambulatory Visit: Attending: Cardiovascular Disease | Admitting: Cardiovascular Disease

## 2024-05-06 VITALS — BP 130/74 | HR 70 | Ht 68.5 in | Wt 205.4 lb

## 2024-05-06 DIAGNOSIS — I25118 Atherosclerotic heart disease of native coronary artery with other forms of angina pectoris: Secondary | ICD-10-CM

## 2024-05-06 DIAGNOSIS — I5022 Chronic systolic (congestive) heart failure: Secondary | ICD-10-CM

## 2024-05-06 DIAGNOSIS — I10 Essential (primary) hypertension: Secondary | ICD-10-CM

## 2024-05-06 DIAGNOSIS — E785 Hyperlipidemia, unspecified: Secondary | ICD-10-CM

## 2024-05-06 DIAGNOSIS — I779 Disorder of arteries and arterioles, unspecified: Secondary | ICD-10-CM | POA: Diagnosis not present

## 2024-05-06 DIAGNOSIS — I493 Ventricular premature depolarization: Secondary | ICD-10-CM | POA: Diagnosis not present

## 2024-05-06 NOTE — Patient Instructions (Signed)

## 2024-05-06 NOTE — Progress Notes (Signed)
 "    Cardiology Office Note   Date:  05/06/2024   ID:  Willie Clark, DOB 04/28/1950, MRN [id number]  PCP:  [name], NP  Cardiologist:   Deatrice Cage, MD   Chief Complaint  Patient presents with   Follow-up    Cardiac clearance c/o chest discomfort when he yawns or takes a deep breath. Meds reviewed verbally with pt.      History of Present Illness: Willie Clark is a 74 y.o. male who presents for regarding coronary artery disease and chronic systolic heart failure.  He has known history of coronary artery disease. He is status post coronary artery bypass graft surgery in 1993 and redo in 2007.   He suffers from fibromyalgia and chronic pain.  He had GI symptoms in 2015 due to documented gastric ulcer. He had neck surgery in 2016 without complications. He has known history of chronic neck pain with 2 previous cervical spine surgeries.    He was hospitalized in August of 2021 with chest pain.  Echocardiogram showed an EF of 40 to 45%.  Cardiac catheterization showed severe underlying three-vessel coronary arteries with patent grafts and no significant change since 2017.  The patient antihypertensive medications were switched to losartan  and carvedilol .  Rosuvastatin  was increased to 40 mg daily. Losartan  was subsequently switched to Entresto .  However, the patient had allergic reaction with rash and itching that had to be treated in the emergency room.  Spironolactone  was subsequently added.  He was seen on March of 2022 with symptoms of unstable angina.  His EKG was worrisome for inferior ST elevation myocardial infarction and thus he was transferred emergently to the Cath Lab where he underwent cardiac catheterization which showed severe native three-vessel coronary artery disease with patent left circumflex stent with mild to moderate in-stent restenosis, widely patent LIMA to LAD, patent SVG to OM 2 with 60 to 70% proximal graft stenosis and subtotally occluded SVG to RCA with long  segment of proximal graft disease and 99% stenosis at the distal anastomosis.  He underwent successful PCI to SVG to RCA with overlapping resolute Onyx drug-eluting stents. Echocardiogram during that admission showed an EF of 35 to 40%.    He was hospitalized in July, 2022 with increased shortness of breath and chest pain.   Cardiac catheterization was performed which showed significant underlying three-vessel coronary artery disease with patent left circumflex stent with moderate in-stent restenosis, patent LIMA to LAD, patent SVG to right PDA with patent stents and patent SVG to OM with sequential 75% and 60% ostial and mid graft stenoses.  Right heart catheterization showed mildly elevated filling pressures with mildly reduced cardiac output.  PCI with 2 nonoverlapping drug-eluting stent placement was done to SVG to OM.  He underwent hemicolectomy in June, 2023 for recurrent diverticulitis and abdominal pain.   He had recurrent angina in July 2025 and underwent cardiac catheterization which showed interval occlusion of SVG to OM with patent LIMA to LAD and patent SVG to right PDA with patent stents.  The OM got collaterals from the RCA. Ranexa  was added in October of last year.  He fell in November and had an emergency room visit for that.  His blood sugar was elevated at 325. He was most recently seen in our office in December and was noted to be mildly volume overloaded.  He was encouraged to take torsemide  daily. He has been doing reasonably well.  He has chronic angina that seems to be stable.  Past  Medical History:  Diagnosis Date   AAA (abdominal aortic aneurysm) 02/23/2020   a.) AAA duplex 02/23/2020: measured 3.0 cm   Acute coronary syndrome (HCC)    Anxiety    Aortic atherosclerosis    Arthritis    ASD (atrial septal defect) 01/20/2006   a.) noted on intraoperative TEE; s/p closure   Barrett esophagus    Barrett's esophagus    Bilateral leg edema    Bilateral occipital neuralgia     Bowel obstruction (HCC)    Carpal tunnel syndrome    Chronic chest pain    Chronic HFrEF (heart failure with reduced ejection fraction) (HCC)    a. 12/2023 Echo: EF 35-40%, glob HK, GrI DD, mildly reduced RV fxn, mild MR/AI.   COPD (chronic obstructive pulmonary disease) (HCC)    Coronary artery disease    a. CABG 93; Redo 07; b. LHC 09/2015, 11/2019: Multivessel disease, patent grafts; c. LHC 06/2020: 80-99 VG-RCA (DES x3); d. R/LHC 07/22: 75 (ost) & 60 (mid) VG-OM (DES x2); e. 10/2023 Cath: Multivessel CAD with occlusion of the vein graft to the obtuse marginal.  LIMA to LAD patent, vein graft to the distal RCA patent.  Medical therapy.   Depression    Diabetes mellitus without complication (HCC)    Diverticulitis    Dyshidrosis    Dysphagia    Enlarged prostate without lower urinary tract symptoms (luts)    Erectile dysfunction    Fibromyalgia    Gastric ulcer    Gastritis    GERD (gastroesophageal reflux disease)    Headache    Hemorrhoids    HFrEF (heart failure with reduced ejection fraction) (HCC)    a.) 09/2016 Echo: EF 55-60%, no rwma, triv AI, mild MR, mildly dil LA. -> as of April 2020: EF 40 to 45%.  Septal dyssynergy/hypokinesis due to postop state but otherwise unable to assess wall motion Sharol due to poor study. b.) TTE 12/12/2019: EF 40-45%, LAE, mild AR, G1DD; c.) TTE 06/20/2020: EF 35-40%, sev inferolateral HK, mild AR, G1DD.   History of BPH    History of kidney stones    History of myocardial infarct at age less than 60 years    Hyperlipidemia    Hypertension    Hyponatremia    Idiopathic urticaria    Intermittent claudication    Ischemic cardiomyopathy    Kidney stones    Left ventricular failure (HCC)    Lightheadedness    Liver disease    Long term current use of antithrombotics/antiplatelets    a.) daily DAPT therapy (ASA + clopidogrel )   Mass on back    Migraine without aura    Myocardial infarction (HCC) 1993   Nail dystrophy    Nephrolithiasis     Obstructive uropathy    Orthostatic hypotension    a. Improved after discontinuation of metoprolol .   OSA on CPAP    Peripheral neuropathy    Peripheral neuropathy    Peyronie disease    Postoperative atrial fibrillation/flutter (HCC) 01/20/2006   a.) POD2 following re-do CABG; Tx'd with IV amiodarone, which did not convert; EF 20% on POD3; DCCV (100 J x 1) POD4.   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   a.) 2v CABG: LIMA-LAD, SVG-D2   S/P cervical spinal fusion    S/P RE-DO CABG x 2 01/20/2006   a.) 2v REDO CABG: reserse SVG-PDA, SVG-OM1  Sinus bradycardia    Skin lesions, generalized    Small bowel obstruction (HCC) 02/2009   ST elevation myocardial infarction (STEMI) of inferior wall (HCC) 06/2020   a.) LHC 06/2020 --> subtotally occluded SVG-RCA with long segment of proximal graft disease and 99% stenosis at the distal anastomosis --> successful PCI to SVG to RCA with overlapping Resolute Onyx DES x 3   Stroke [facility]) 1993   right side weakness no blood thinners   on Aspirin    Syncope and collapse    Synovial cyst of lumbar facet joint    TIA (transient ischemic attack) 08/13/2014   Urticaria     Past Surgical History:  Procedure Laterality Date   BACK SURGERY     BIOPSY  04/22/2023   Procedure: BIOPSY;  Surgeon: Maryruth Ole DASEN, MD;  Location: [facility] ENDOSCOPY;  Service: Endoscopy;;  Gastric and Colonic   bowel obstruction  01/2009   CARDIAC CATHETERIZATION  11/2006   patent grafts. Significant OM3 disease and occluded diagonals.   CARDIAC CATHETERIZATION  06/2010   patent grafts. significant ISR in proximal LCX but OM2 is bypassed and gives retrograde flow to OM3.    CARDIAC CATHETERIZATION  06/2013   [facility]: Patent grafts with 60% proximal in-stent restenosis in the left circumflex with FFR of 0.85   CARDIAC CATHETERIZATION Left 09/25/2015   Procedure: Left Heart Cath and  Cors/Grafts Angiography;  Surgeon: Deatrice DELENA Cage, MD;  Location: [facility] INVASIVE CV LAB;  Service: Cardiovascular;  Laterality: Left;   CATARACT EXTRACTION     CERVICAL FUSION     CHOLECYSTECTOMY     COLON SURGERY  01/2009   BOWEL RESECTION DUE TO SMALL BOWEL OBSTRUCTION   COLONOSCOPY     COLONOSCOPY WITH PROPOFOL  N/A 10/28/2017   Procedure: COLONOSCOPY WITH PROPOFOL ;  Surgeon: Gaylyn Gladis PENNER, MD;  Location: [facility] ENDOSCOPY;  Service: Endoscopy;  Laterality: N/A;   COLONOSCOPY WITH PROPOFOL  N/A 07/30/2021   Procedure: COLONOSCOPY WITH PROPOFOL ;  Surgeon: Maryruth Ole DASEN, MD;  Location: [facility] ENDOSCOPY;  Service: Endoscopy;  Laterality: N/A;   COLONOSCOPY WITH PROPOFOL  N/A 04/22/2023   Procedure: COLONOSCOPY WITH PROPOFOL ;  Surgeon: Maryruth Ole DASEN, MD;  Location: [facility] ENDOSCOPY;  Service: Endoscopy;  Laterality: N/A;   CORONARY ANGIOPLASTY  2006   CORONARY ARTERY BYPASS GRAFT  1993/01/2006   redo at [facility]. LIMA to LAD, SVG to OM2 and SVG to RPDA   CORONARY STENT INTERVENTION N/A 06/20/2020   Procedure: CORONARY STENT INTERVENTION;  Surgeon: Mady Bruckner, MD;  Location: [facility] INVASIVE CV LAB;  Service: Cardiovascular;  Laterality: N/A;   CORONARY STENT INTERVENTION N/A 11/15/2020   Procedure: CORONARY STENT INTERVENTION;  Surgeon: Mady Bruckner, MD;  Location: [facility] INVASIVE CV LAB;  Service: Cardiovascular;  Laterality: N/A;   CYSTOSCOPY WITH STENT PLACEMENT Bilateral 09/24/2015   Procedure: CYSTOSCOPY WITH BILATERAL RETROGRADES, BILATERAL STENT PLACEMENT;  Surgeon: Donnice Brooks, MD;  Location: [facility] ORS;  Service: Urology;  Laterality: Bilateral;   ESOPHAGOGASTRODUODENOSCOPY N/A 10/25/2014   Procedure: ESOPHAGOGASTRODUODENOSCOPY (EGD);  Surgeon: Gladis PENNER Gaylyn, MD;  Location: [facility] ENDOSCOPY;  Service: Endoscopy;  Laterality: N/A;   ESOPHAGOGASTRODUODENOSCOPY (EGD) WITH PROPOFOL  N/A 02/03/2017   Procedure: ESOPHAGOGASTRODUODENOSCOPY (EGD) WITH PROPOFOL ;  Surgeon:  Gaylyn Gladis PENNER, MD;  Location: [facility] ENDOSCOPY;  Service: Endoscopy;  Laterality: N/A;   ESOPHAGOGASTRODUODENOSCOPY (EGD) WITH PROPOFOL  N/A 04/18/2017   Procedure: ESOPHAGOGASTRODUODENOSCOPY (EGD) WITH PROPOFOL ;  Surgeon: Gaylyn Gladis PENNER, MD;  Location: [facility] ENDOSCOPY;  Service: Endoscopy;  Laterality: N/A;   ESOPHAGOGASTRODUODENOSCOPY (EGD) WITH PROPOFOL  N/A 04/22/2023  Procedure: ESOPHAGOGASTRODUODENOSCOPY (EGD) WITH PROPOFOL ;  Surgeon: Maryruth Ole DASEN, MD;  Location: [facility] ENDOSCOPY;  Service: Endoscopy;  Laterality: N/A;   EYE SURGERY Bilateral    Cataract Extraction with IOL   HERNIA REPAIR     INGUINAL HERNIA REPAIR Bilateral 01/23/2016   Procedure: HERNIA REPAIR INGUINAL ADULT BILATERAL;  Surgeon: Larinda Unknown Sharps, MD;  Location: [facility] ORS;  Service: General;  Laterality: Bilateral;   LEFT HEART CATH AND CORS/GRAFTS ANGIOGRAPHY N/A 12/13/2019   Procedure: LEFT HEART CATH AND CORS/GRAFTS ANGIOGRAPHY;  Surgeon: Darron Deatrice LABOR, MD;  Location: [facility] INVASIVE CV LAB;  Service: Cardiovascular;  Laterality: N/A;   LEFT HEART CATH AND CORS/GRAFTS ANGIOGRAPHY N/A 06/20/2020   Procedure: LEFT HEART CATH AND CORS/GRAFTS ANGIOGRAPHY;  Surgeon: Mady Bruckner, MD;  Location: [facility] INVASIVE CV LAB;  Service: Cardiovascular;  Laterality: N/A;   LEFT HEART CATH AND CORS/GRAFTS ANGIOGRAPHY N/A 11/18/2023   Procedure: LEFT HEART CATH AND CORS/GRAFTS ANGIOGRAPHY;  Surgeon: Darron Deatrice LABOR, MD;  Location: [facility] INVASIVE CV LAB;  Service: Cardiovascular;  Laterality: N/A;   LUMBAR LAMINECTOMY/DECOMPRESSION MICRODISCECTOMY Left 10/01/2016   Procedure: Left Lumbar four-five Laminotomy for resection of synovial cyst;  Surgeon: Unice Pac, MD;  Location: [facility] OR;  Service: Neurosurgery;  Laterality: Left;  left   NECK SURGERY  06/2009   POLYPECTOMY  04/22/2023   Procedure: POLYPECTOMY;  Surgeon: Maryruth Ole DASEN, MD;  Location: [facility] ENDOSCOPY;  Service: Endoscopy;;  Colon   RIGHT/LEFT HEART  CATH AND CORONARY/GRAFT ANGIOGRAPHY N/A 11/15/2020   Procedure: RIGHT/LEFT HEART CATH AND CORONARY/GRAFT ANGIOGRAPHY;  Surgeon: Mady Bruckner, MD;  Location: [facility] INVASIVE CV LAB;  Service: Cardiovascular;  Laterality: N/A;   SHOULDER SURGERY Bilateral 2010     Current Outpatient Medications  Medication Sig Dispense Refill   acetaminophen  (TYLENOL ) 500 MG tablet Take 1,000 mg by mouth every 6 (six) hours as needed for mild pain.     albuterol  (VENTOLIN  HFA) 108 (90 Base) MCG/ACT inhaler Inhale 1 puff into the lungs as needed.     aspirin  EC 81 MG EC tablet Take 1 tablet (81 mg total) by mouth daily. Swallow whole. 30 tablet 11   benzonatate (TESSALON) 100 MG capsule Take 100 mg by mouth 3 (three) times daily as needed for cough.     carvedilol  (COREG ) 3.125 MG tablet TAKE 1 TABLET (3.125 MG TOTAL) BY MOUTH 2 (TWO) TIMES DAILY WITH A MEAL. 180 tablet 3   citalopram  (CELEXA ) 20 MG tablet Take 20 mg by mouth daily.      clobetasol  cream (TEMOVATE ) 0.05 % APPLY TO ITCHY AREAS ON ELBOWS TWICE A DAY UNTIL IMPROVED. AVOID FACE, GROIN, UNDERARMS. 30 g 1   clopidogrel  (PLAVIX ) 75 MG tablet Take 1 tablet (75 mg total) by mouth daily. 90 tablet 3   EPINEPHrine  0.3 mg/0.3 mL IJ SOAJ injection Inject 0.3 mg into the muscle as needed.     fexofenadine (ALLEGRA) 180 MG tablet Take 180 mg by mouth daily.     finasteride  (PROSCAR ) 5 MG tablet Take 5 mg by mouth daily.     gabapentin  (NEURONTIN ) 400 MG capsule Take 400-800 mg by mouth 3 (three) times daily.     guaiFENesin-codeine 100-10 MG/5ML syrup Take 5 mLs by mouth at bedtime.     isosorbide  mononitrate (IMDUR ) 60 MG 24 hr tablet TAKE 1 TABLET EVERY DAY 90 tablet 3   JARDIANCE  10 MG TABS tablet Take 10 mg by mouth daily.     LANTUS SOLOSTAR 100 UNIT/ML Solostar Pen Inject 12 Units into  the skin daily.     LINZESS 72 MCG capsule Take 72 mcg by mouth daily before breakfast.     Multiple Vitamins-Minerals (HAIR SKIN AND NAILS FORMULA PO) Take 1 capsule  by mouth daily.     nitroGLYCERIN  (NITROSTAT ) 0.4 MG SL tablet Place 1 tablet (0.4 mg total) under the tongue every 5 (five) minutes as needed for chest pain. 90 tablet 1   ondansetron  (ZOFRAN -ODT) 4 MG disintegrating tablet Take 4 mg by mouth every 8 (eight) hours as needed.     pantoprazole  (PROTONIX ) 40 MG tablet Take 40 mg by mouth 2 (two) times daily.      Probiotic Product (PROBIOTIC PO) Take by mouth daily at 8 pm.     Psyllium (DAILY FIBER PO) Take 1 tablet by mouth daily.     ranolazine  (RANEXA ) 500 MG 12 hr tablet TAKE 1 TABLET BY MOUTH TWICE A DAY 180 tablet 1   rOPINIRole  (REQUIP ) 1 MG tablet Take 1 mg by mouth 2 (two) times daily.     rosuvastatin  (CRESTOR ) 40 MG tablet Take 1 tablet (40 mg total) by mouth daily. 90 tablet 0   silodosin  (RAPAFLO ) 8 MG CAPS capsule Take 8 mg by mouth daily.     spironolactone  (ALDACTONE ) 25 MG tablet TAKE 1 TABLET EVERY DAY 90 tablet 3   torsemide  (DEMADEX ) 20 MG tablet TAKE 2 TABLETS EVERY DAY 180 tablet 3   traMADol  (ULTRAM ) 50 MG tablet Take 1 tablet (50 mg total) by mouth every 6 (six) hours as needed for up to 15 doses for severe pain or moderate pain. 15 tablet 0   vitamin B-12 (CYANOCOBALAMIN ) 1000 MCG tablet Take 1,000 mcg by mouth daily.     [Paused] potassium chloride  SA (KLOR-CON  M) 20 MEQ tablet Take 1 tablet (20 mEq total) by mouth daily. (Patient not taking: Reported on 05/06/2024) 90 tablet 0   No current facility-administered medications for this visit.   Facility-Administered Medications Ordered in Other Visits  Medication Dose Route Frequency Provider Last Rate Last Admin   indocyanine green  (IC-GREEN ) injection 5 mg  5 mg Intravenous Once Sakai, Isami, DO        Allergies:   Amitriptyline, Amoxicillin -pot clavulanate, Cyclobenzaprine, Dicyclomine , Sacubitril-valsartan, Icosapent  ethyl (epa ethyl ester) (fish), Amitriptyline hcl, Bacon flavoring agent (non-screening), Hydrocodone , Fish oil, Lidocaine , Omega-3 fatty acids-vitamin e,  Other, Oxycodone -acetaminophen , and Sulfa antibiotics    Social History:  The patient  reports that he quit smoking about 32 years ago. His smoking use included cigarettes. He smoked an average of 2.5 packs per day. He has never used smokeless tobacco. He reports that he does not currently use alcohol . He reports that he does not use drugs.   Family History:  The patient's family history includes Breast cancer in his paternal aunt; Heart attack in his father; Heart disease (age of onset: 4) in his father.    ROS:  Please see the history of present illness.   Otherwise, review of systems are positive for none.   All other systems are reviewed and negative.    PHYSICAL EXAM: VS:  BP 130/74 (BP Location: Left Arm, Patient Position: Sitting, Cuff Size: Normal)   Pulse 70   Ht 5' 8.5 (1.74 m)   Wt 205 lb 6 oz (93.2 kg)   SpO2 97%   BMI 30.77 kg/m  , BMI Body mass index is 30.77 kg/m. GEN: Well nourished, well developed, in no acute distress  HEENT: normal  Neck: no JVD, carotid bruits, or masses Cardiac:  RRR; no murmurs, rubs, or gallops, mild bilateral leg edema Respiratory:  clear to auscultation bilaterally, normal work of breathing GI: soft, nontender, nondistended, + BS MS: no deformity or atrophy  Skin: warm and dry, no rash Neuro:  Strength and sensation are intact Psych: euthymic mood, full affect   EKG:  EKG is  ordered today EKG showed: Normal sinus rhythm Nonspecific ST and T wave abnormality When compared with ECG of 24-Mar-2024 08:46, Premature ventricular complexes are no longer Present Nonspecific T wave abnormality has replaced inverted T waves in Inferior leads       Recent Labs: 03/15/2024: ALT 31; BUN 11; Creatinine, Ser 1.12; Hemoglobin 12.7; Platelets 242; Potassium 3.7; Sodium 131    Lipid Panel    Component Value Date/Time   CHOL 149 11/17/2023 1048   TRIG 249 (H) 11/17/2023 1048   HDL 45 11/17/2023 1048   CHOLHDL 3.3 11/17/2023 1048   VLDL 50  (H) 11/17/2023 1048   LDLCALC 54 11/17/2023 1048      Wt Readings from Last 3 Encounters:  05/06/24 205 lb 6 oz (93.2 kg)  03/24/24 205 lb 8 oz (93.2 kg)  03/15/24 199 lb 4.7 oz (90.4 kg)         ASSESSMENT AND PLAN:  1.  Coronary artery disease involving native coronary arteries with stable angina: Most recent cardiac catheterization in July showed an occluded SVG to OM with collaterals to the left circumflex distribution from the right coronary artery.  Recommend continuing medical therapy.  He reports minimal improvement with ranolazine .    2.  Chronic systolic heart failure: Most recent echocardiogram showed an EF of 35 to 40%.  He appears to be mildly volume overloaded but seems to be stable overall.  Continue torsemide  20 mg daily.  He is back on Jardiance  10 mg daily.     He is allergic to Entresto .  Losartan  was associated with hypotension.  Continue carvedilol  and spironolactone .    3. Essential hypertension: Blood pressure is controlled.   4. Hyperlipidemia: Continue high-dose rosuvastatin  and Vascepa .  Most recent lipid profile showed an LDL of 50 and triglyceride of 249.   5. Mild carotid disease: Previous carotid Doppler showed less than 40% stenosis bilaterally. No need to repeat.  6.  PVCs: Was noted to have PVCs during the last visit but no evidence of PVCs today.  7.  Preop cardiovascular evaluation for spine injection: This is planned by Dr. Darlis low risk from a cardiac standpoint.  Clopidogrel  can be held 5 to 7 days before the procedure.  Aspirin  81 mg once daily should not be interrupted.   Disposition: Follow-up in 6 months.   Signed,  Deatrice Cage, MD  05/06/2024 8:23 AM    [facility] "

## 2024-08-03 ENCOUNTER — Ambulatory Visit: Admitting: Dermatology

## 2024-12-07 ENCOUNTER — Ambulatory Visit: Admitting: Physician Assistant
